# Patient Record
Sex: Female | Born: 1965
Health system: Southern US, Community
[De-identification: ages and names within clinical notes are randomized; demographics above are authoritative.]

## PROBLEM LIST (undated history)

## (undated) DIAGNOSIS — E119 Type 2 diabetes mellitus without complications: Secondary | ICD-10-CM

## (undated) DIAGNOSIS — D689 Coagulation defect, unspecified: Secondary | ICD-10-CM

## (undated) DIAGNOSIS — I10 Essential (primary) hypertension: Secondary | ICD-10-CM

## (undated) DIAGNOSIS — R519 Headache, unspecified: Secondary | ICD-10-CM

## (undated) DIAGNOSIS — M1990 Unspecified osteoarthritis, unspecified site: Secondary | ICD-10-CM

## (undated) DIAGNOSIS — G629 Polyneuropathy, unspecified: Secondary | ICD-10-CM

## (undated) DIAGNOSIS — K219 Gastro-esophageal reflux disease without esophagitis: Secondary | ICD-10-CM

## (undated) DIAGNOSIS — R42 Dizziness and giddiness: Secondary | ICD-10-CM

## (undated) DIAGNOSIS — E785 Hyperlipidemia, unspecified: Secondary | ICD-10-CM

## (undated) DIAGNOSIS — M5136 Other intervertebral disc degeneration, lumbar region: Secondary | ICD-10-CM

## (undated) DIAGNOSIS — M51369 Other intervertebral disc degeneration, lumbar region without mention of lumbar back pain or lower extremity pain: Secondary | ICD-10-CM

## (undated) DIAGNOSIS — R51 Headache: Secondary | ICD-10-CM

## (undated) HISTORY — PX: CHOLECYSTECTOMY: SHX55

## (undated) HISTORY — DX: Hyperlipidemia, unspecified: E78.5

## (undated) HISTORY — PX: UTERINE FIBROID SURGERY: SHX826

## (undated) HISTORY — PX: SHOULDER SURGERY: SHX246

## (undated) HISTORY — DX: Type 2 diabetes mellitus without complications: E11.9

## (undated) HISTORY — DX: Essential (primary) hypertension: I10

## (undated) HISTORY — PX: TONSILLECTOMY AND ADENOIDECTOMY: SUR1326

## (undated) HISTORY — PX: OTHER SURGICAL HISTORY: SHX169

## (undated) HISTORY — PX: ABDOMINAL HYSTERECTOMY: SHX81

## (undated) HISTORY — PX: OVARIAN CYST SURGERY: SHX726

---

## 2005-02-12 ENCOUNTER — Ambulatory Visit: Payer: Self-pay

## 2006-05-12 ENCOUNTER — Other Ambulatory Visit: Payer: Self-pay

## 2006-05-12 ENCOUNTER — Emergency Department: Payer: Self-pay | Admitting: Unknown Physician Specialty

## 2006-08-04 LAB — HM PAP SMEAR

## 2007-01-12 ENCOUNTER — Ambulatory Visit: Payer: Self-pay | Admitting: Family Medicine

## 2007-01-20 ENCOUNTER — Ambulatory Visit: Payer: Self-pay | Admitting: Family Medicine

## 2007-01-20 LAB — HM MAMMOGRAPHY

## 2008-11-13 ENCOUNTER — Emergency Department: Payer: Self-pay | Admitting: Internal Medicine

## 2013-07-31 ENCOUNTER — Emergency Department: Payer: Self-pay | Admitting: Emergency Medicine

## 2013-12-15 ENCOUNTER — Ambulatory Visit: Payer: Self-pay | Admitting: Unknown Physician Specialty

## 2014-10-12 LAB — TSH: TSH: 1.19 u[IU]/mL (ref <=5.90)

## 2014-10-12 LAB — CBC AND DIFFERENTIAL
HCT: 43 % (ref 36–46)
HEMOGLOBIN: 14.7 g/dL (ref 12.0–16.0)
Platelets: 380 10*3/uL (ref 150–399)
WBC: 9.4 10^3/mL

## 2015-02-20 LAB — BASIC METABOLIC PANEL
BUN: 10 mg/dL (ref 4–21)
CREATININE: 0.6 mg/dL (ref 0.5–1.1)
Glucose: 231 mg/dL
POTASSIUM: 5.3 mmol/L (ref 3.4–5.3)
SODIUM: 139 mmol/L (ref 137–147)

## 2015-02-20 LAB — HEPATIC FUNCTION PANEL
ALT: 52 U/L — AB (ref 7–35)
AST: 34 U/L (ref 13–35)
Alkaline Phosphatase: 84 U/L (ref 25–125)
BILIRUBIN, TOTAL: 0.8 mg/dL

## 2015-02-20 LAB — HEMOGLOBIN A1C: HEMOGLOBIN A1C: 8.3 % — AB (ref 4.0–6.0)

## 2015-03-06 ENCOUNTER — Encounter: Payer: Self-pay | Admitting: Endocrinology

## 2015-03-06 ENCOUNTER — Encounter (INDEPENDENT_AMBULATORY_CARE_PROVIDER_SITE_OTHER): Payer: Self-pay

## 2015-03-06 ENCOUNTER — Ambulatory Visit (INDEPENDENT_AMBULATORY_CARE_PROVIDER_SITE_OTHER): Payer: 59 | Admitting: Endocrinology

## 2015-03-06 VITALS — BP 110/68 | HR 87 | Resp 14 | Ht 70.0 in | Wt 224.2 lb

## 2015-03-06 DIAGNOSIS — R252 Cramp and spasm: Secondary | ICD-10-CM | POA: Insufficient documentation

## 2015-03-06 DIAGNOSIS — E1140 Type 2 diabetes mellitus with diabetic neuropathy, unspecified: Secondary | ICD-10-CM | POA: Insufficient documentation

## 2015-03-06 DIAGNOSIS — E785 Hyperlipidemia, unspecified: Secondary | ICD-10-CM | POA: Insufficient documentation

## 2015-03-06 DIAGNOSIS — E1142 Type 2 diabetes mellitus with diabetic polyneuropathy: Secondary | ICD-10-CM

## 2015-03-06 DIAGNOSIS — E042 Nontoxic multinodular goiter: Secondary | ICD-10-CM | POA: Insufficient documentation

## 2015-03-06 DIAGNOSIS — E010 Iodine-deficiency related diffuse (endemic) goiter: Secondary | ICD-10-CM

## 2015-03-06 DIAGNOSIS — E049 Nontoxic goiter, unspecified: Secondary | ICD-10-CM

## 2015-03-06 DIAGNOSIS — G2581 Restless legs syndrome: Secondary | ICD-10-CM | POA: Insufficient documentation

## 2015-03-06 DIAGNOSIS — R7989 Other specified abnormal findings of blood chemistry: Secondary | ICD-10-CM | POA: Insufficient documentation

## 2015-03-06 DIAGNOSIS — M545 Low back pain, unspecified: Secondary | ICD-10-CM | POA: Insufficient documentation

## 2015-03-06 DIAGNOSIS — I10 Essential (primary) hypertension: Secondary | ICD-10-CM

## 2015-03-06 DIAGNOSIS — R945 Abnormal results of liver function studies: Secondary | ICD-10-CM

## 2015-03-06 DIAGNOSIS — G8929 Other chronic pain: Secondary | ICD-10-CM | POA: Insufficient documentation

## 2015-03-06 DIAGNOSIS — K219 Gastro-esophageal reflux disease without esophagitis: Secondary | ICD-10-CM | POA: Insufficient documentation

## 2015-03-06 DIAGNOSIS — E669 Obesity, unspecified: Secondary | ICD-10-CM | POA: Insufficient documentation

## 2015-03-06 LAB — HM DIABETES FOOT EXAM: HM Diabetic Foot Exam: ABNORMAL

## 2015-03-06 MED ORDER — GLUCOSE BLOOD VI STRP: ORAL_STRIP | Status: DC

## 2015-03-06 MED ORDER — CANAGLIFLOZIN 100 MG PO TABS: 100.0000 mg | ORAL_TABLET | Freq: Every day | ORAL | Status: DC

## 2015-03-06 NOTE — Assessment & Plan Note (Signed)
BP at target today. Update urine MA at next few visits.

## 2015-03-06 NOTE — Assessment & Plan Note (Signed)
Felt on today's exam. Discussed goiter, compressive symptoms, thyroid nodules in brief. Ordered thyroid US for evaluation and TSH.

## 2015-03-06 NOTE — Assessment & Plan Note (Signed)
Obtain lipid testing from PCP. Has mild transaminitis. Now off statin due to leg cramping.

## 2015-03-06 NOTE — Progress Notes (Signed)
Reason for visit-  Gabriella Marsh is a 49 y.o.-year-old female, referred by her PCP,  Dr Margarita Rana, of [facility], for management of Type 2 diabetes, uncontrolled, with complications ( neuropathy).   HPI- Patient has been diagnosed with diabetes in 2002/2003. Recalls being initially on lifestyle modifications.  Tried  Metformin, Glipizide, Actos, Januvia. she has not been on insulin before. Has not tried injectable therapy before.   Pt is currently on a regimen of: - Janumet 50/1000 mg PO BID ( ~2012) - Glipizide 5-10 mg twice daily ( takes 5 mg BID three times weekly due to perceived hypoglycemia)   Last hemoglobin A1c was: Lab Results  Component Value Date   HGBA1C 8.3* 02/20/2015   11/2014- 7.9% 02/2015-8.3%  Pt checks her sugars 0 a day recently . Uses Verio glucometer- just got it as preferred by insurance. By recall/meter download/meter review they are:  PREMEAL Breakfast Lunch Dinner Bedtime Overall  Glucose range:     200s  Mean/median:        POST-MEAL PC Breakfast PC Lunch PC Dinner  Glucose range:     Mean/median:       Hypoglycemia-  No lows. Lowest sugar was ?; she has hypoglycemia awareness at 70. Once- 2 times monthly gets symptoms  Dietary habits- eats three times daily. Tries to limit carbs, sweetened beverages, sodas, desserts. Weakness- icecream, not anymore Cherrios/oatmeal BF, apples/banana- snack. Lunch cheese, meat, pretzels. Afternoon snack- grapes Dinner- hot meal, occ night time snack- popcorn, nuts. Made dietary changes last week.  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  Wt Readings from Last 3 Encounters:  03/06/15 224 lb 4 oz (101.719 kg)    Diabetes Complications-  Nephropathy- No  CKD, last BUN/creatinine- GFR 109. mildy elevated K levels recently.  Lab Results  Component Value Date   BUN 10 02/20/2015   CREATININE 0.6 02/20/2015   Lab Results  Component Value Date    K 5.3 02/20/2015     Retinopathy- No, Last DEE was in Feb 2016 Neuropathy- has numbness and tingling in )her feet. Known neuropathy. Being treated with neurontin, nortriptyline. Sees podiatry annually. Associated history - No CAD . No prior stroke. No hypothyroidism. her last TSH was No results found for: TSH  Hyperlipidemia-  her last set of lipids were- was on Lipitor, taken off due to leg cramps about 3 months ago. Tolerating well.  No results found for: CHOL, HDL, LDLCALC, LDLDIRECT, TRIG, CHOLHDL  Lab Results  Component Value Date   AST 34 02/20/2015   ALT 52* 02/20/2015     Blood Pressure/HTN- Patient's blood pressure is well controlled today on current regimen that includes ACE-I ( lisinopril).  Pt has FH of DM in father.  I have reviewed the patient's past medical history, family and social history, surgical history, medications and allergies.  Past Medical History  Diagnosis Date  . Hypertension   . Diabetes mellitus without complication   . Hyperlipidemia    Past Surgical History  Procedure Laterality Date  . Cesarean section    . Abdominal hysterectomy    . Shoulder surgery Left    Family History  Problem Relation Age of Onset  . Osteoporosis Mother   . Diabetes Father   . Heart disease Father   . Hypertension Father   . Osteoporosis Maternal Grandmother    History   Social History  . Marital Status: Divorced    Spouse Name: N/A  . Number of  Children: N/A  . Years of Education: N/A   Occupational History  . Not on file.   Social History Main Topics  . Smoking status: Former Research scientist (life sciences)  . Smokeless tobacco: Never Used  . Alcohol Use: No  . Drug Use: No  . Sexual Activity: Not on file   Other Topics Concern  . Not on file   Social History Narrative  . No narrative on file   No current outpatient prescriptions on file prior to visit.   No current facility-administered medications on file prior to visit.   No Known Allergies   Review of  Systems: [x]  complains of  [  ] denies General:   [  ] Recent weight change [ x ] Fatigue  [  ] Loss of appetite Eyes: [  ]  Vision Difficulty [  ]  Eye pain ENT: [  ]  Hearing difficulty [  ]  Difficulty Swallowing CVS: [  ] Chest pain [  ]  Palpitations/Irregular Heart beat [  ]  Shortness of breath lying flat [ x ] Swelling of legs Resp: [  ] Frequent Cough [  ] Shortness of Breath  [  ]  Wheezing GI: [ x ] Heartburn  [  ] Nausea or Vomiting  [  ] Diarrhea [  ] Constipation  [  ] Abdominal Pain GU: [  ]  Polyuria  [  ]  nocturia Bones/joints:  [ x ]  Muscle aches  [ x ] Joint Pain  [  ] Bone pain Skin/Hair/Nails: [  ]  Rash  [  ] New stretch marks [  ]  Itching [  ] Hair loss [  ]  Excessive hair growth Reproduction: [  ] Low sexual desire , [  ]  Women: Menstrual cycle problems [  ]  Women: Breast Discharge [  ] Men: Difficulty with erections [  ]  Men: Enlarged Breasts CNS: [  ] Frequent Headaches [  ] Blurry vision [  ] Tremors [  ] Seizures [  ] Loss of consciousness [ x ] Localized weakness Endocrine: [x  ]  Excess thirst [ x ]  Feeling excessively hot [  ]  Feeling excessively cold Heme: [  ]  Easy bruising [  ]  Enlarged glands or lumps in neck Allergy: [  ]  Food allergies [  ] Environmental allergies  PE: BP 110/68 mmHg  Pulse 87  Resp 14  Ht 5\' 10"  (1.778 m)  Wt 224 lb 4 oz (101.719 kg)  BMI 32.18 kg/m2  SpO2 95% Wt Readings from Last 3 Encounters:  03/06/15 224 lb 4 oz (101.719 kg)   GENERAL: No acute distress, well developed HEENT:  Eye exam shows normal external appearance. Oral exam shows normal mucosa .  NECK:   Neck exam shows no lymphadenopathy. No Carotids bruits. Thyroid is enlarged and no nodules felt.  no acanthosis nigricans LUNGS:         Chest is symmetrical. Lungs are clear to auscultation.Marland Kitchen   HEART:         Heart sounds:  S1 and S2 are normal. No murmurs or clicks heard. ABDOMEN:  No Distention present. Liver and spleen are not palpable. No other mass or  tenderness present. no abnormal striae EXTREMITIES:     There is no edema. 2+ DP pulses  NEUROLOGICAL:     Grossly intact.            Diabetic foot exam done  with shoes and socks removed: abnormal and decreased over toes Monofilament testing bilaterally. No deformities of toes.  Nails  Not dystrophic. Skin normal color. No open wounds. Dry skin.  MUSCULOSKELETAL:       There is no enlargement or gross deformity of the joints.  SKIN:       No rash  ASSESSMENT AND PLAN: Problem List Items Addressed This Visit      Cardiovascular and Mediastinum   Essential hypertension    BP at target today. Update urine MA at next few visits.       Relevant Medications   lisinopril (PRINIVIL,ZESTRIL) 5 MG tablet     Endocrine   Thyromegaly    Felt on today's exam. Discussed goiter, compressive symptoms, thyroid nodules in brief. Ordered thyroid US for evaluation and TSH.       Relevant Orders   US Soft Tissue Head/Neck   TSH     Nervous and Auditory   Diabetic neuropathy - Primary    Recent A1c not controlled and trending up.  Discussed goal A1c and sugars.  Discussed dietary changes and incorporating protein and low carb options in all meals.  Discussed "desk exercises" to be done if possible while at work.   Discussed foot care, eye exams, hypoglycemia treatment and recognition  Discussed medication management and options for better sugar control.  She has elected to try SGLT2 therapy. Check CMP first due to recent hyperkalemia. If K is normal, then will start Invokana 100 mg daily. Risk profile and side effects discussed.  She will return in 2 weeks for recheck of GFR.   Continue current janumet for now. Decrease Glipizide to 5 mg twice daily due to symptomatic hypoglycemia.         Relevant Medications   lisinopril (PRINIVIL,ZESTRIL) 5 MG tablet   glipiZIDE (GLUCOTROL) 10 MG tablet   sitaGLIPtin-metformin (JANUMET) 50-1000 MG per tablet   canagliflozin (INVOKANA) 100 MG TABS tablet    Other Relevant Orders   Comprehensive metabolic panel     Other   Obesity    Encouraged lifestyle modifications and hopefully with start of SGLT2 therapy she will continue to lose weight.       Relevant Medications   glipiZIDE (GLUCOTROL) 10 MG tablet   sitaGLIPtin-metformin (JANUMET) 50-1000 MG per tablet   canagliflozin (INVOKANA) 100 MG TABS tablet   Hyperlipidemia    Obtain lipid testing from PCP. Has mild transaminitis. Now off statin due to leg cramping.       Relevant Medications   lisinopril (PRINIVIL,ZESTRIL) 5 MG tablet       - Return to clinic in 6 weeks with sugar log/meter.  Eustacia Urbanek [facility] 03/06/2015 9:29 AM

## 2015-03-06 NOTE — Progress Notes (Signed)
Pre visit review using our clinic review tool, if applicable. No additional management support is needed unless otherwise documented below in the visit note. 

## 2015-03-06 NOTE — Patient Instructions (Signed)
Check sugars 2 x daily ( before breakfast and before supper).  Record them in a log book and bring that/meter to next appointment.   Continue current janumet.  Decrease glipizide to 5 mg twice daily.  Start Invokana 100 mg daily with breakfast. Stay hydrated. Return in 2 weeks for non fasting labs.  Labs today.   Please come back for a follow-up appointment in 6 weeks

## 2015-03-06 NOTE — Assessment & Plan Note (Signed)
Recent A1c not controlled and trending up.  Discussed goal A1c and sugars.  Discussed dietary changes and incorporating protein and low carb options in all meals.  Discussed "desk exercises" to be done if possible while at work.   Discussed foot care, eye exams, hypoglycemia treatment and recognition  Discussed medication management and options for better sugar control.  She has elected to try SGLT2 therapy. Check CMP first due to recent hyperkalemia. If K is normal, then will start Invokana 100 mg daily. Risk profile and side effects discussed.  She will return in 2 weeks for recheck of GFR.   Continue current janumet for now. Decrease Glipizide to 5 mg twice daily due to symptomatic hypoglycemia.

## 2015-03-06 NOTE — Assessment & Plan Note (Signed)
Encouraged lifestyle modifications and hopefully with start of SGLT2 therapy she will continue to lose weight.

## 2015-03-07 ENCOUNTER — Other Ambulatory Visit: Payer: Self-pay | Admitting: Endocrinology

## 2015-03-07 LAB — COMPREHENSIVE METABOLIC PANEL
ALT: 53 IU/L — ABNORMAL HIGH (ref 0–32)
AST: 35 IU/L (ref 0–40)
Albumin/Globulin Ratio: 1.7 (ref 1.1–2.5)
Albumin: 4.4 g/dL (ref 3.5–5.5)
Alkaline Phosphatase: 89 IU/L (ref 39–117)
BUN/Creatinine Ratio: 28 — ABNORMAL HIGH (ref 9–23)
BUN: 16 mg/dL (ref 6–24)
Bilirubin Total: 0.8 mg/dL (ref 0.0–1.2)
CO2: 25 mmol/L (ref 18–29)
Calcium: 9.9 mg/dL (ref 8.7–10.2)
Chloride: 96 mmol/L — ABNORMAL LOW (ref 97–108)
Creatinine, Ser: 0.58 mg/dL (ref 0.57–1.00)
GFR, EST AFRICAN AMERICAN: 127 mL/min/{1.73_m2} (ref >59)
GFR, EST NON AFRICAN AMERICAN: 110 mL/min/{1.73_m2} (ref >59)
Globulin, Total: 2.6 g/dL (ref 1.5–4.5)
Glucose: 198 mg/dL — ABNORMAL HIGH (ref 65–99)
POTASSIUM: 5.9 mmol/L — AB (ref 3.5–5.2)
Sodium: 138 mmol/L (ref 134–144)
Total Protein: 7 g/dL (ref 6.0–8.5)

## 2015-03-07 MED ORDER — METFORMIN HCL 1000 MG PO TABS: 1000.0000 mg | ORAL_TABLET | Freq: Two times a day (BID) | ORAL | Status: DC

## 2015-03-07 MED ORDER — ALBIGLUTIDE 30 MG ~~LOC~~ PEN: 30.0000 mg | PEN_INJECTOR | SUBCUTANEOUS | Status: DC

## 2015-03-08 ENCOUNTER — Telehealth: Payer: Self-pay | Admitting: *Deleted

## 2015-03-08 ENCOUNTER — Other Ambulatory Visit: Payer: Self-pay | Admitting: Endocrinology

## 2015-03-08 DIAGNOSIS — E1142 Type 2 diabetes mellitus with diabetic polyneuropathy: Secondary | ICD-10-CM

## 2015-03-08 LAB — TSH: TSH: 1.11 u[IU]/mL (ref 0.450–4.500)

## 2015-03-08 LAB — SPECIMEN STATUS REPORT

## 2015-03-08 NOTE — Telephone Encounter (Signed)
Pt coming in Monday what labs and dx? 

## 2015-03-08 NOTE — Telephone Encounter (Signed)
Orders for bmp in the emr. thanks

## 2015-03-11 ENCOUNTER — Other Ambulatory Visit: Payer: 59

## 2015-03-12 ENCOUNTER — Other Ambulatory Visit: Payer: 59

## 2015-03-13 ENCOUNTER — Other Ambulatory Visit (INDEPENDENT_AMBULATORY_CARE_PROVIDER_SITE_OTHER): Payer: 59

## 2015-03-13 DIAGNOSIS — E1142 Type 2 diabetes mellitus with diabetic polyneuropathy: Secondary | ICD-10-CM

## 2015-03-13 LAB — BASIC METABOLIC PANEL
BUN: 12 mg/dL (ref 6–23)
CALCIUM: 9.5 mg/dL (ref 8.4–10.5)
CO2: 27 mEq/L (ref 19–32)
Chloride: 99 mEq/L (ref 96–112)
Creatinine, Ser: 0.61 mg/dL (ref 0.40–1.20)
GFR: 111.29 mL/min (ref >60.00)
GLUCOSE: 214 mg/dL — AB (ref 70–99)
Potassium: 4.9 mEq/L (ref 3.5–5.1)
Sodium: 136 mEq/L (ref 135–145)

## 2015-03-13 NOTE — Addendum Note (Signed)
Addended by: Johnsie Cancel on: 03/13/2015 08:06 AM   Modules accepted: Orders

## 2015-03-19 ENCOUNTER — Ambulatory Visit: Payer: Self-pay | Admitting: Endocrinology

## 2015-03-20 ENCOUNTER — Other Ambulatory Visit: Payer: 59

## 2015-03-22 ENCOUNTER — Telehealth: Payer: Self-pay | Admitting: *Deleted

## 2015-03-22 ENCOUNTER — Encounter: Payer: Self-pay | Admitting: Endocrinology

## 2015-03-22 NOTE — Telephone Encounter (Signed)
Left message for pt to return my call. Also need to ask patient if she is taking Tanzeum using discount card. PA was denied.

## 2015-03-22 NOTE — Telephone Encounter (Signed)
-----   Message from Haydee Monica, MD sent at 03/22/2015  2:07 PM EDT ----- Regarding: thyroid US results Please let her know that I have reviewed her thyroid US results from [facility] , done 03/19/15:  Right lobe- 5.3x2.3x2.2cm.  Dominant right nodule with solid and cystic features 2.1 x1.2 x1.9cm.  Left lobe - 5.4 x2.2 x1.9 cm.  Hypoechoic mid left lobe nodule 1.1cm  x6 x79mm Inferior nodule 22 x17 x21 mm with solid features  Isthmus- 60mm without nodules.  No enlarged lymph nodes  Both the dominant right, 2.1 cm and dominant left inferior nodule, 2.2 cm meet criteria for FNA.  Usually thyroid nodules are benign on pathology, but sometimes they could return as abnormal ( 5-15% risk of malignancy per nodule). The procedure is done in the office at [facility] radiology under local anesthesia. There are no major complications associated with the biopsy or any major restrictions after the procedure. Small risk of infection and bleeding locally, which is similar to a blood draw. Please let me know if you would like to get set up for this, and I can put in the order. Please let me know if you have any other questions and I can answer them for you. Alternatively, we could discuss this at follow up visit.

## 2015-03-25 NOTE — Telephone Encounter (Signed)
Left message for pt to return my call.

## 2015-03-26 NOTE — Telephone Encounter (Signed)
Called and advised patient of results,  verbalized understanding. Would like to discuss FNA more at upcoming appt, FYI.   Pt has been using Tanzeum with discount card. Doing well without complaints.

## 2015-03-26 NOTE — Telephone Encounter (Signed)
Noted, thanks!

## 2015-04-01 ENCOUNTER — Telehealth: Payer: Self-pay | Admitting: *Deleted

## 2015-04-01 NOTE — Telephone Encounter (Signed)
Pt called states since starting the Metformin 1000 mg she has had diarrhea 4-6 times a day.  Please advise

## 2015-04-02 ENCOUNTER — Other Ambulatory Visit: Payer: Self-pay

## 2015-04-02 ENCOUNTER — Other Ambulatory Visit: Payer: Self-pay | Admitting: Endocrinology

## 2015-04-02 MED ORDER — METFORMIN HCL ER (MOD) 1000 MG PO TB24: 1000.0000 mg | ORAL_TABLET | Freq: Two times a day (BID) | ORAL | Status: DC

## 2015-04-02 NOTE — Telephone Encounter (Signed)
Spoke to patient to notify her of Dr. Boyd Kerbs comments. Patient verbalized understanding and agrees to try the new Rx. Patient also stated that she has neuropathy in her feet and was out of work for 2 days. Patient has FMLA but is considered that she is spending more and more time out of work due to her health issues. Patient would like to discuss this matter with Dr. Pamala Duffel in detail at her next office visit. Dr. Howell Rucks sent in Rx to mail order pharmacy but it was not a 90 day supply. I sent a 30 day supply to [facility] per patient request and sent a new 90 day Rx to mail order pharmacy. Patient aware.

## 2015-04-02 NOTE — Telephone Encounter (Signed)
Noted. Please let her know that FMLA requests and paperwork are typically done through PCP office. I will discuss neuropathy with her though at f/u appt

## 2015-04-02 NOTE — Telephone Encounter (Signed)
See message below. Thanks

## 2015-04-02 NOTE — Telephone Encounter (Signed)
Please ask her to try Metformin Er/XR ( I have sent the script to her pharmacy). If she does not tolerate this, then she should let me know.

## 2015-04-02 NOTE — Telephone Encounter (Signed)
30 day supply sent to local pharmacy and 90 supply sent to mail order as requested by patient. Dr. Howell Rucks aware.

## 2015-04-02 NOTE — Telephone Encounter (Signed)
I informed patient that her PCP will have to continue filling out her FMLA forms as needed. Patient verbalized understanding.

## 2015-04-10 ENCOUNTER — Telehealth: Payer: Self-pay | Admitting: Endocrinology

## 2015-04-10 NOTE — Telephone Encounter (Signed)
PA for Metformin ER started and faxed to [facility]

## 2015-04-16 ENCOUNTER — Other Ambulatory Visit: Payer: Self-pay | Admitting: Endocrinology

## 2015-04-16 MED ORDER — METFORMIN HCL ER 500 MG PO TB24: 1000.0000 mg | ORAL_TABLET | Freq: Two times a day (BID) | ORAL | Status: DC

## 2015-04-17 ENCOUNTER — Ambulatory Visit (INDEPENDENT_AMBULATORY_CARE_PROVIDER_SITE_OTHER): Payer: 59 | Admitting: Endocrinology

## 2015-04-17 ENCOUNTER — Other Ambulatory Visit: Payer: Self-pay | Admitting: Endocrinology

## 2015-04-17 ENCOUNTER — Encounter: Payer: Self-pay | Admitting: Endocrinology

## 2015-04-17 VITALS — BP 126/70 | HR 80 | Temp 98.2°F | Resp 16 | Ht 70.0 in | Wt 221.4 lb

## 2015-04-17 DIAGNOSIS — I10 Essential (primary) hypertension: Secondary | ICD-10-CM | POA: Diagnosis not present

## 2015-04-17 DIAGNOSIS — E785 Hyperlipidemia, unspecified: Secondary | ICD-10-CM | POA: Diagnosis not present

## 2015-04-17 DIAGNOSIS — E042 Nontoxic multinodular goiter: Secondary | ICD-10-CM

## 2015-04-17 DIAGNOSIS — E1142 Type 2 diabetes mellitus with diabetic polyneuropathy: Secondary | ICD-10-CM

## 2015-04-17 MED ORDER — GABAPENTIN 100 MG PO CAPS: 200.0000 mg | ORAL_CAPSULE | Freq: Three times a day (TID) | ORAL | Status: DC

## 2015-04-17 NOTE — Assessment & Plan Note (Signed)
Has mild transaminitis. Now off statin due to leg cramping. No recent lipid testing.

## 2015-04-17 NOTE — Assessment & Plan Note (Signed)
BP at target today. Update urine MA at next few visits.   Off ACE-I since last visit due to hyperkalemia.  This might be reintroduced at next few visits with careful monitoring of her K levels.

## 2015-04-17 NOTE — Assessment & Plan Note (Signed)
Discussed etiology of thyroid nodules, follow up monitoring, possible pathology reports, 5-15% risk of thyroid cancer.  Discussed FNA of thyroid nodules and the patient is agreeable.  Will order for FNA of right thyroid nodule and left inferior nodule.

## 2015-04-17 NOTE — Progress Notes (Signed)
Reason for visit-  Gabriella Marsh is a 49 y.o.-year-old female, here for follow up management of Type 2 diabetes, uncontrolled, with complications ( neuropathy).   HPI- Patient has been diagnosed with diabetes in 2002/2003. Recalls being initially on lifestyle modifications.  Tried  Metformin, Glipizide, Actos, Januvia. she has not been on insulin before.   -Januvia stopped March 2016>>changed to GLP-1 and Metformin -Didn't tolerate the regular form of metformin due to diarrhea>>changed to XR form  Pt is currently on a regimen of: - tanzeum 30 mg [place] weekly ( start March 2016) - Glipizide 5 mg twice daily (decreased at last visit)>>she misunderstood instructions and stopped taking med -Metformin XR 1000 mg twice daily (start March 2016)>>tolerating better than regular metformin   Last hemoglobin A1c was: Lab Results  Component Value Date   HGBA1C 8.3* 02/20/2015   11/2014- 7.9% 02/2015-8.3%  Pt checks her sugars <1 a day recently . Uses Verio glucometer- just got it as preferred by insurance. By recall/meter download/meter review they are:  PREMEAL Breakfast Lunch Dinner Bedtime Overall  Glucose range: 170-180 228-229     Mean/median:        POST-MEAL PC Breakfast PC Lunch PC Dinner  Glucose range:     Mean/median:       Hypoglycemia-  No lows. Lowest sugar was ?; she has hypoglycemia awareness at 70. Once- 2 times monthly gets symptoms-not happening recently   Dietary habits- eats three times daily. Tries to limit carbs, sweetened beverages, sodas, desserts. Weakness- icecream, not anymore Cherrios/oatmeal BF, apples/banana- snack. Lunch cheese, meat, pretzels. Afternoon snack- grapes Dinner- hot meal, occ night time snack- popcorn, nuts. Made dietary changes last week. 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 Working 8 hours daily. Wonders if taking time off work might be better. hasnt seen her PCP yet Weight - down recently.  Wt Readings from Last 3 Encounters:  04/17/15 221 lb 6.4 oz (100.426 kg)  03/06/15 224 lb 4 oz (101.719 kg)    Diabetes Complications-  Nephropathy- No  CKD, last BUN/creatinine- GFR 109. mildy elevated K levels recently>>taken off lisinopril and K normalized.  Lab Results  Component Value Date   BUN 12 03/13/2015   CREATININE 0.61 03/13/2015   Lab Results  Component Value Date   K 4.9 03/13/2015     Retinopathy- No, Last DEE was in Feb 2016 Neuropathy- has numbness and tingling in )her feet. Known neuropathy. Being treated with neurontin, nortriptyline. Sees podiatry annually.not controlled Associated history - No CAD . No prior stroke. No hypothyroidism. her last TSH was  Lab Results  Component Value Date   TSH 1.110 03/06/2015    Hyperlipidemia-  her last set of lipids were- was on Lipitor, taken off due to leg cramps about 3 months ago. Tolerating well.  No results found for: CHOL, HDL, LDLCALC, LDLDIRECT, TRIG, CHOLHDL  Lab Results  Component Value Date   AST 35 03/06/2015   ALT 53* 03/06/2015     Blood Pressure/HTN- Patient's blood pressure is well controlled today on current regimen. Off ACE-I ( lisinopril) since last visit due to hyperkalemia.  Multinodular goiter- Detected on last exam. She has dominant right and left inferior nodules that meet criteria for FNA. No FH thyroid cancer or personal XRT exposure. Recent TSH normal.  I have reviewed the patient's past medical history,  medications and allergies.   Current Outpatient Prescriptions on File Prior to Visit  Medication Sig Dispense Refill  . Albiglutide (TANZEUM) 30 MG PEN Inject 30 mg into the skin once a week. 4 each 3  . ALPRAZolam (XANAX) 0.5 MG tablet Take 0.5 mg by mouth 2 (two) times daily as needed for anxiety.    .  gabapentin (NEURONTIN) 600 MG tablet Take 600 mg by mouth 3 (three) times daily.    Marland Kitchen glipiZIDE (GLUCOTROL) 10 MG tablet Take 5 mg by mouth 2 (two) times daily before a meal.    . glucose blood test strip Check blood sugars twice daily. OneTouch Verio strips. 200 each 5  . lisinopril (PRINIVIL,ZESTRIL) 5 MG tablet Take 5 mg by mouth daily.    . metFORMIN (GLUCOPHAGE-XR) 500 MG 24 hr tablet Take 2 tablets (1,000 mg total) by mouth 2 (two) times daily with a meal. 120 tablet 1  . nortriptyline (PAMELOR) 25 MG capsule Take 25 mg by mouth at bedtime.    Marland Kitchen oxyCODONE-acetaminophen (PERCOCET/ROXICET) 5-325 MG per tablet Take 1 tablet by mouth every 6 (six) hours as needed for severe pain.    Marland Kitchen venlafaxine (EFFEXOR) 75 MG tablet Take 75 mg by mouth 2 (two) times daily.     No current facility-administered medications on file prior to visit.   No Known Allergies   Review of Systems- [ x ]  Complains of    [  ]  denies [  ] Recent weight change [ x ]  Fatigue [  ] polydipsia [  ] polyuria [  ]  nocturia [  ]  vision difficulty [  ] chest pain [  ] shortness of breath [ x ] leg swelling [  ] cough [x  ] nausea/vomiting-mild, episodic [  ] diarrhea [  ] constipation [  ] abdominal pain [  ]  tingling/numbness in extremities [x  ]  concern with feet ( wounds/sores)  PE: BP 126/70 mmHg  Pulse 80  Temp(Src) 98.2 F (36.8 C) (Oral)  Resp 16  Ht 5\' 10"  (1.778 m)  Wt 221 lb 6.4 oz (100.426 kg)  BMI 31.77 kg/m2  SpO2 98% Wt Readings from Last 3 Encounters:  04/17/15 221 lb 6.4 oz (100.426 kg)  03/06/15 224 lb 4 oz (101.719 kg)   Exam: deferred  ASSESSMENT AND PLAN: Problem List Items Addressed This Visit      Cardiovascular and Mediastinum   Essential hypertension    BP at target today. Update urine MA at next few visits.   Off ACE-I since last visit due to hyperkalemia.  This might be reintroduced at next few visits with careful monitoring of her K levels.         Endocrine    Multinodular goiter    Discussed etiology of thyroid nodules, follow up monitoring, possible pathology reports, 5-15% risk of thyroid cancer.  Discussed FNA of thyroid nodules and the patient is agreeable.  Will order for FNA of right thyroid nodule and left inferior nodule.        Relevant Orders   US Thyroid Biopsy     Nervous and Auditory   Diabetic neuropathy - Primary    Recent A1c not controlled and trending up.  Congratulated her on making dietary changes and the recent weight loss.  Encouraged her to check sugars 2xdaily and bring meter to all appointments.   She will continue current metformin, tanzeum and restart  Glipiizde at 5 mg twice daily.  Discussed management of neuropathy..increasing gabapentin versus adding on Cymbalta.   She has elected to try increased Gabapentin for now and will increase it to 800 mg three times daily.  Report back if does not tolerate or not helpful, then would add on Cymbalta.  She was also asked to follow up with her PCP to consider referral to pain clinic or PT. She was asking my opinion to be off work for some time, and this may be helpful, though I explained that I cannot write her a worknote for such.  She will make an appointment with Dr Venia Minks for this            Other   Hyperlipidemia     Has mild transaminitis. Now off statin due to leg cramping. No recent lipid testing.            - Return to clinic in 6 weeks with sugar log/meter.  Polly Barner [facility] 04/17/2015 9:07 AM

## 2015-04-17 NOTE — Patient Instructions (Signed)
Check sugars 2 x daily ( before breakfast and before supper).  Record them in a log book and bring that/meter to next appointment.   Continue metformin XR, Tanzeum and restart Glipizide to 5 mg twice daily.   Increase Gabapentin to 800 mg three times daily.  Report back if dont tolerate due to drowsiness  Schedule for thyroid FNA. Follow up with PCP for consideration of referral to pain clinic and PT.  Please come back for a follow-up appointment in 6 weeks

## 2015-04-17 NOTE — Assessment & Plan Note (Signed)
Recent A1c not controlled and trending up.  Congratulated her on making dietary changes and the recent weight loss.  Encouraged her to check sugars 2xdaily and bring meter to all appointments.   She will continue current metformin, tanzeum and restart Glipiizde at 5 mg twice daily.  Discussed management of neuropathy..increasing gabapentin versus adding on Cymbalta.   She has elected to try increased Gabapentin for now and will increase it to 800 mg three times daily.  Report back if does not tolerate or not helpful, then would add on Cymbalta.  She was also asked to follow up with her PCP to consider referral to pain clinic or PT. She was asking my opinion to be off work for some time, and this may be helpful, though I explained that I cannot write her a worknote for such.  She will make an appointment with Dr Venia Minks for this

## 2015-04-17 NOTE — Progress Notes (Signed)
Pre visit review using our clinic review tool, if applicable. No additional management support is needed unless otherwise documented below in the visit note. 

## 2015-05-01 ENCOUNTER — Ambulatory Visit
Admission: RE | Admit: 2015-05-01 | Discharge: 2015-05-01 | Disposition: A | Discharge status: Home / Self Care | Payer: 59 | Source: Ambulatory Visit | Attending: Endocrinology | Admitting: Endocrinology

## 2015-05-01 ENCOUNTER — Other Ambulatory Visit (HOSPITAL_COMMUNITY)
Admission: RE | Admit: 2015-05-01 | Discharge: 2015-05-01 | Disposition: A | Discharge status: Home / Self Care | Payer: 59 | Source: Ambulatory Visit | Attending: Interventional Radiology | Admitting: Interventional Radiology

## 2015-05-01 DIAGNOSIS — E041 Nontoxic single thyroid nodule: Secondary | ICD-10-CM | POA: Insufficient documentation

## 2015-05-01 DIAGNOSIS — E042 Nontoxic multinodular goiter: Secondary | ICD-10-CM

## 2015-05-02 ENCOUNTER — Encounter: Payer: Self-pay | Admitting: Endocrinology

## 2015-05-03 DIAGNOSIS — R0683 Snoring: Secondary | ICD-10-CM | POA: Insufficient documentation

## 2015-05-03 DIAGNOSIS — G64 Other disorders of peripheral nervous system: Secondary | ICD-10-CM | POA: Insufficient documentation

## 2015-05-03 DIAGNOSIS — E1140 Type 2 diabetes mellitus with diabetic neuropathy, unspecified: Secondary | ICD-10-CM | POA: Insufficient documentation

## 2015-05-03 DIAGNOSIS — F4320 Adjustment disorder, unspecified: Secondary | ICD-10-CM | POA: Insufficient documentation

## 2015-05-03 DIAGNOSIS — E119 Type 2 diabetes mellitus without complications: Secondary | ICD-10-CM

## 2015-05-03 DIAGNOSIS — M51379 Other intervertebral disc degeneration, lumbosacral region without mention of lumbar back pain or lower extremity pain: Secondary | ICD-10-CM | POA: Insufficient documentation

## 2015-05-03 DIAGNOSIS — M5137 Other intervertebral disc degeneration, lumbosacral region: Secondary | ICD-10-CM | POA: Insufficient documentation

## 2015-05-03 DIAGNOSIS — E7800 Pure hypercholesterolemia, unspecified: Secondary | ICD-10-CM | POA: Insufficient documentation

## 2015-05-03 DIAGNOSIS — G2581 Restless legs syndrome: Secondary | ICD-10-CM | POA: Insufficient documentation

## 2015-05-03 DIAGNOSIS — I10 Essential (primary) hypertension: Secondary | ICD-10-CM | POA: Insufficient documentation

## 2015-05-03 DIAGNOSIS — G629 Polyneuropathy, unspecified: Secondary | ICD-10-CM | POA: Insufficient documentation

## 2015-05-03 DIAGNOSIS — G4701 Insomnia due to medical condition: Secondary | ICD-10-CM | POA: Insufficient documentation

## 2015-05-03 DIAGNOSIS — E669 Obesity, unspecified: Secondary | ICD-10-CM | POA: Insufficient documentation

## 2015-05-04 ENCOUNTER — Encounter: Payer: Self-pay | Admitting: Emergency Medicine

## 2015-05-04 ENCOUNTER — Emergency Department
Admission: EM | Admit: 2015-05-04 | Discharge: 2015-05-04 | Disposition: A | Discharge status: Other Acute Inpatient Hospital | Payer: 59 | Attending: Emergency Medicine | Admitting: Emergency Medicine

## 2015-05-04 ENCOUNTER — Other Ambulatory Visit: Payer: Self-pay | Admitting: Endocrinology

## 2015-05-04 ENCOUNTER — Emergency Department: Payer: 59

## 2015-05-04 DIAGNOSIS — Z87891 Personal history of nicotine dependence: Secondary | ICD-10-CM | POA: Diagnosis not present

## 2015-05-04 DIAGNOSIS — R1010 Upper abdominal pain, unspecified: Secondary | ICD-10-CM | POA: Diagnosis present

## 2015-05-04 DIAGNOSIS — Z79899 Other long term (current) drug therapy: Secondary | ICD-10-CM | POA: Insufficient documentation

## 2015-05-04 DIAGNOSIS — E119 Type 2 diabetes mellitus without complications: Secondary | ICD-10-CM | POA: Insufficient documentation

## 2015-05-04 DIAGNOSIS — R1013 Epigastric pain: Secondary | ICD-10-CM | POA: Diagnosis not present

## 2015-05-04 DIAGNOSIS — I609 Nontraumatic subarachnoid hemorrhage, unspecified: Secondary | ICD-10-CM | POA: Diagnosis not present

## 2015-05-04 DIAGNOSIS — F419 Anxiety disorder, unspecified: Secondary | ICD-10-CM | POA: Insufficient documentation

## 2015-05-04 DIAGNOSIS — I10 Essential (primary) hypertension: Secondary | ICD-10-CM | POA: Diagnosis not present

## 2015-05-04 LAB — LIPASE, BLOOD: Lipase: 47 U/L (ref 22–51)

## 2015-05-04 LAB — CBC WITH DIFFERENTIAL/PLATELET
BASOS ABS: 0.1 10*3/uL (ref 0–0.1)
Basophils Relative: 1 %
Eosinophils Absolute: 0.1 10*3/uL (ref 0–0.7)
HCT: 40.8 % (ref 35.0–47.0)
HEMOGLOBIN: 13.9 g/dL (ref 12.0–16.0)
Lymphs Abs: 3.3 10*3/uL (ref 1.0–3.6)
MCH: 29.2 pg (ref 26.0–34.0)
MCHC: 34.1 g/dL (ref 32.0–36.0)
MCV: 85.5 fL (ref 80.0–100.0)
MONO ABS: 0.8 10*3/uL (ref 0.2–0.9)
Monocytes Relative: 7 %
NEUTROS ABS: 6.1 10*3/uL (ref 1.4–6.5)
Neutrophils Relative %: 59 %
Platelets: 265 10*3/uL (ref 150–440)
RBC: 4.77 MIL/uL (ref 3.80–5.20)
RDW: 12.8 % (ref 11.5–14.5)
WBC: 10.4 10*3/uL (ref 3.6–11.0)

## 2015-05-04 LAB — COMPREHENSIVE METABOLIC PANEL
ALBUMIN: 3.9 g/dL (ref 3.5–5.0)
ALK PHOS: 73 U/L (ref 38–126)
ALT: 68 U/L — ABNORMAL HIGH (ref 14–54)
AST: 48 U/L — ABNORMAL HIGH (ref 15–41)
Anion gap: 10 (ref 5–15)
BUN: 13 mg/dL (ref 6–20)
CO2: 25 mmol/L (ref 22–32)
CREATININE: 0.61 mg/dL (ref 0.44–1.00)
Calcium: 9.3 mg/dL (ref 8.9–10.3)
Chloride: 102 mmol/L (ref 101–111)
GFR calc Af Amer: >60 mL/min (ref >60)
GFR calc non Af Amer: >60 mL/min (ref >60)
Glucose, Bld: 246 mg/dL — ABNORMAL HIGH (ref 65–99)
POTASSIUM: 4.4 mmol/L (ref 3.5–5.1)
Sodium: 137 mmol/L (ref 135–145)
Total Bilirubin: 0.9 mg/dL (ref 0.3–1.2)
Total Protein: 7.6 g/dL (ref 6.5–8.1)

## 2015-05-04 LAB — TROPONIN I
Troponin I: <0.03 ng/mL (ref <0.031)
Troponin I: <0.03 ng/mL (ref <0.031)

## 2015-05-04 MED ORDER — SODIUM CHLORIDE 0.9 % IV BOLUS (SEPSIS)
1000.0000 mL | Freq: Once | INTRAVENOUS | Status: AC
  Administered 2015-05-04: 1000 mL via INTRAVENOUS

## 2015-05-04 MED ORDER — ONDANSETRON HCL 4 MG/2ML IJ SOLN
INTRAMUSCULAR | Status: AC
  Administered 2015-05-04: 4 mg via INTRAVENOUS
  Filled 2015-05-04: qty 2

## 2015-05-04 MED ORDER — ONDANSETRON HCL 4 MG/2ML IJ SOLN
4.0000 mg | Freq: Once | INTRAMUSCULAR | Status: AC
  Administered 2015-05-04: 4 mg via INTRAVENOUS

## 2015-05-04 MED ORDER — MORPHINE SULFATE 4 MG/ML IJ SOLN
4.0000 mg | Freq: Once | INTRAMUSCULAR | Status: AC
Start: 2015-05-04 — End: 2015-05-04
  Administered 2015-05-04: 4 mg via INTRAVENOUS

## 2015-05-04 MED ORDER — MORPHINE SULFATE 4 MG/ML IJ SOLN
INTRAMUSCULAR | Status: AC
  Administered 2015-05-04: 4 mg via INTRAVENOUS
  Filled 2015-05-04: qty 1

## 2015-05-04 NOTE — ED Notes (Signed)
Patient transported to CT 

## 2015-05-04 NOTE — ED Notes (Signed)
Patient return from CT

## 2015-05-04 NOTE — ED Notes (Signed)
Patient resting in bed. No acute distress noted. Respirations equal and unlabored.

## 2015-05-04 NOTE — ED Provider Notes (Signed)
[facility] Emergency Department Provider Note  Time seen: 8:35 AM  I have reviewed the triage vital signs and the nursing notes.   HISTORY  Chief Complaint Abdominal Pain    HPI Gabriella Marsh is a 49 y.o. female with a past medical history of hypertension, hyperlipidemia, diabetes, chronic pain, neuropathy who presents to the emergency department with upper abdominal/chest pain. According to the patient she called her husband around 3 AM complaining of upper abdominal/lower chest pain. At first the patient was asking her husband where he was, he was at work. Patient is not clear if she was confused or if she was just waking up. She does note that she takes Percocet during the day and Xanax at night before going to sleep. Patient has not had any other confusion since coming to the ER, has been acting normal per the husband. Patient states she continues to have upper abdominal/lower chest pain. Has a history of reflux but states this feels somewhat different. Patient denies any fever, nausea/vomiting, diarrhea, dysuria. Patient describes the pain as a burning sensation/dull pain in the upper abdomen (entire upper abdomen left and right side) and lower chest. Denies any shortness of breath.     Past Medical History  Diagnosis Date  . Hypertension   . Diabetes mellitus without complication   . Hyperlipidemia     Patient Active Problem List   Diagnosis Date Noted  . Adaptation reaction 05/03/2015  . DDD (degenerative disc disease), lumbosacral 05/03/2015  . Diabetes 05/03/2015  . Hypercholesteremia 05/03/2015  . BP (high blood pressure) 05/03/2015  . Insomnia due to medical condition 05/03/2015  . Neuropathy 05/03/2015  . Adiposity 05/03/2015  . Disorder of peripheral nervous system 05/03/2015  . Restless leg 05/03/2015  . Snores 05/03/2015  . Thyroid nodule 05/03/2015  . Diabetic neuropathy 03/06/2015  . Essential hypertension 03/06/2015  . Elevated  liver function tests 03/06/2015  . Obesity 03/06/2015  . Hyperlipidemia 03/06/2015  . GERD (gastroesophageal reflux disease) 03/06/2015  . Multinodular goiter 03/06/2015  . RLS (restless legs syndrome) 03/06/2015  . LBP (low back pain) 03/06/2015  . Leg cramps 03/06/2015    Past Surgical History  Procedure Laterality Date  . Cesarean section    . Abdominal hysterectomy    . Shoulder surgery Left     Current Outpatient Rx  Name  Route  Sig  Dispense  Refill  . Albiglutide (TANZEUM) 30 MG PEN   Subcutaneous   Inject 30 mg into the skin once a week.   4 each   3   . ALPRAZolam (XANAX) 0.5 MG tablet   Oral   Take 0.5 mg by mouth 2 (two) times daily as needed for anxiety.         Marland Kitchen esomeprazole (NEXIUM) 20 MG capsule   Oral   Take by mouth.         . gabapentin (NEURONTIN) 100 MG capsule   Oral   Take 2 capsules (200 mg total) by mouth 3 (three) times daily. Along with 600 mg three times daily (TDD 2400 mg daily)   180 capsule   0   . gabapentin (NEURONTIN) 600 MG tablet   Oral   Take 600 mg by mouth 3 (three) times daily.         Marland Kitchen glipiZIDE (GLUCOTROL) 10 MG tablet   Oral   Take 5 mg by mouth 2 (two) times daily before a meal.         . glucose blood  test strip      Check blood sugars twice daily. OneTouch Verio strips.   200 each   5   . lisinopril (PRINIVIL,ZESTRIL) 5 MG tablet   Oral   Take 5 mg by mouth daily.         . metFORMIN (GLUCOPHAGE-XR) 500 MG 24 hr tablet   Oral   Take 2 tablets (1,000 mg total) by mouth 2 (two) times daily with a meal.   120 tablet   1     Glumetza denied- switching over to metformin XR fo ...   . nortriptyline (PAMELOR) 25 MG capsule   Oral   Take 25 mg by mouth at bedtime.         Marland Kitchen oxyCODONE-acetaminophen (PERCOCET/ROXICET) 5-325 MG per tablet   Oral   Take 1 tablet by mouth every 6 (six) hours as needed for severe pain.         Marland Kitchen venlafaxine (EFFEXOR) 75 MG tablet   Oral   Take 75 mg by mouth 2  (two) times daily.           Allergies Celecoxib and Pregabalin  Family History  Problem Relation Age of Onset  . Osteoporosis Mother   . Diabetes Father   . Heart disease Father   . Hypertension Father   . Osteoporosis Maternal Grandmother     Social History History  Substance Use Topics  . Smoking status: Former Research scientist (life sciences)  . Smokeless tobacco: Never Used  . Alcohol Use: No    Review of Systems Constitutional: Negative for fever. Cardiovascular: Positive for lower chest pain. Respiratory: Negative for shortness of breath. Gastrointestinal: Positive for epigastric pain. Negative for vomiting/diarrhea Genitourinary: Negative for dysuria. Musculoskeletal: Negative for new back pain.  10-point ROS otherwise negative.  ____________________________________________   PHYSICAL EXAM:  VITAL SIGNS: ED Triage Vitals  Enc Vitals Group     BP 05/04/15 0435 125/83 mmHg     Pulse Rate 05/04/15 0435 100     Resp 05/04/15 0435 18     Temp 05/04/15 0435 98.1 F (36.7 C)     Temp Source 05/04/15 0435 Oral     SpO2 05/04/15 0435 100 %     Weight 05/04/15 0435 225 lb (102.059 kg)     Height 05/04/15 0435 5\' 10"  (1.778 m)     Head Cir --      Peak Flow --      Pain Score 05/04/15 0435 7     Pain Loc --      Pain Edu? --      Excl. in [place]? --     Constitutional: Alert and oriented. Well appearing and in no distress. Eyes: Normal exam, PERRL ENT   Mouth/Throat: Mucous membranes are moist. Cardiovascular: Normal rate, regular rhythm. No murmurs, rubs, or gallops. Respiratory: Normal respiratory effort without tachypnea nor retractions. Breath sounds are clear and equal bilaterally. No wheezes/rales/rhonchi. Gastrointestinal: Soft, mild/moderate epigastric tenderness to palpation. No rebound no guarding. Otherwise benign abdominal exam. Musculoskeletal: Nontender with normal range of motion in all extremities. Neurologic:  Normal speech and language. No gross focal  neurologic deficits are appreciated. Speech is normal. Equal grip strengths bilaterally. No pronator drift. Normal cranial nerves. Skin:  Skin is warm, dry and intact.  Psychiatric: Mood and affect are normal. Speech and behavior are normal.   ____________________________________________    EKG  EKG shows normal sinus rhythm at 97 bpm. Narrow QRS, normal intervals, normal axis. Nonspecific ST changes, but no concerning ST changes.  ____________________________________________    RADIOLOGY  CT the head shows 3 separate areas of possible hemorrhage.   ____________________________________________   INITIAL IMPRESSION / ASSESSMENT AND PLAN / ED COURSE  Pertinent labs & imaging results that were available during my care of the patient were reviewed by me and considered in my medical decision making (see chart for details).  Patient with dizziness and chest pain symptoms. Overnight patient had an episode of confusion with some memory impairment which is very atypical for the patient per the husband. Labs have shown mostly normal results here. CT scan shows 3 possible areas of hemorrhage, possibly consistent with a subarachnoid hemorrhage. I discussed this with the patient, and with [facility] neurosurgery who will accept the patient has a transfer. Blood pressure is controlled currently 126/75.  2 sets of troponins have been negative.  ____________________________________________   FINAL CLINICAL IMPRESSION(S) / ED DIAGNOSES  Subarachnoid hemorrhage Abdominal pain   Harvest Dark, MD 05/04/15 1236

## 2015-05-04 NOTE — ED Notes (Signed)
Pt in with epigastric pain and upper abd pain since tonight, hx of reflux but states feels different.

## 2015-05-22 ENCOUNTER — Encounter: Payer: Self-pay | Admitting: Family Medicine

## 2015-05-23 ENCOUNTER — Other Ambulatory Visit: Payer: Self-pay | Admitting: Neurology

## 2015-05-23 DIAGNOSIS — R42 Dizziness and giddiness: Secondary | ICD-10-CM

## 2015-05-23 DIAGNOSIS — I609 Nontraumatic subarachnoid hemorrhage, unspecified: Secondary | ICD-10-CM

## 2015-05-28 ENCOUNTER — Ambulatory Visit
Admission: RE | Admit: 2015-05-28 | Discharge: 2015-05-28 | Disposition: A | Discharge status: Home / Self Care | Payer: 59 | Source: Ambulatory Visit | Attending: Neurology | Admitting: Neurology

## 2015-05-28 DIAGNOSIS — R42 Dizziness and giddiness: Secondary | ICD-10-CM | POA: Insufficient documentation

## 2015-05-28 DIAGNOSIS — I609 Nontraumatic subarachnoid hemorrhage, unspecified: Secondary | ICD-10-CM

## 2015-05-28 MED ORDER — GADOBENATE DIMEGLUMINE 529 MG/ML IV SOLN
20.0000 mL | Freq: Once | INTRAVENOUS | Status: AC
  Administered 2015-05-28: 20 mL via INTRAVENOUS

## 2015-06-04 ENCOUNTER — Encounter: Payer: Self-pay | Admitting: Family Medicine

## 2015-06-05 ENCOUNTER — Ambulatory Visit (INDEPENDENT_AMBULATORY_CARE_PROVIDER_SITE_OTHER): Payer: 59 | Admitting: Endocrinology

## 2015-06-05 ENCOUNTER — Encounter: Payer: Self-pay | Admitting: Endocrinology

## 2015-06-05 VITALS — BP 108/68 | HR 87 | Resp 12 | Ht 70.0 in | Wt 223.5 lb

## 2015-06-05 DIAGNOSIS — E7800 Pure hypercholesterolemia, unspecified: Secondary | ICD-10-CM

## 2015-06-05 DIAGNOSIS — E042 Nontoxic multinodular goiter: Secondary | ICD-10-CM | POA: Diagnosis not present

## 2015-06-05 DIAGNOSIS — I10 Essential (primary) hypertension: Secondary | ICD-10-CM

## 2015-06-05 DIAGNOSIS — E1142 Type 2 diabetes mellitus with diabetic polyneuropathy: Secondary | ICD-10-CM | POA: Diagnosis not present

## 2015-06-05 LAB — MICROALBUMIN / CREATININE URINE RATIO
Creatinine,U: 194.5 mg/dL
Microalb Creat Ratio: 1.1 mg/g (ref 0.0–30.0)
Microalb, Ur: 2.1 mg/dL — ABNORMAL HIGH (ref 0.0–1.9)

## 2015-06-05 LAB — COMPREHENSIVE METABOLIC PANEL
ALBUMIN: 4.2 g/dL (ref 3.5–5.2)
ALT: 54 U/L — ABNORMAL HIGH (ref 0–35)
AST: 36 U/L (ref 0–37)
Alkaline Phosphatase: 93 U/L (ref 39–117)
BUN: 11 mg/dL (ref 6–23)
CO2: 29 meq/L (ref 19–32)
Calcium: 9.9 mg/dL (ref 8.4–10.5)
Chloride: 97 mEq/L (ref 96–112)
Creatinine, Ser: 0.61 mg/dL (ref 0.40–1.20)
GFR: 111.18 mL/min (ref >60.00)
GLUCOSE: 158 mg/dL — AB (ref 70–99)
POTASSIUM: 5 meq/L (ref 3.5–5.1)
SODIUM: 136 meq/L (ref 135–145)
Total Bilirubin: 0.8 mg/dL (ref 0.2–1.2)
Total Protein: 7.6 g/dL (ref 6.0–8.3)

## 2015-06-05 LAB — HEMOGLOBIN A1C: Hgb A1c MFr Bld: 7.4 % — ABNORMAL HIGH (ref 4.6–6.5)

## 2015-06-05 MED ORDER — METFORMIN HCL ER 500 MG PO TB24: 1000.0000 mg | ORAL_TABLET | Freq: Two times a day (BID) | ORAL | Status: DC

## 2015-06-05 MED ORDER — ALBIGLUTIDE 30 MG ~~LOC~~ PEN
30.0000 mg | PEN_INJECTOR | SUBCUTANEOUS | Status: DC
Start: 2015-06-05 — End: 2015-07-22

## 2015-06-05 NOTE — Progress Notes (Signed)
Reason for visit-  Gabriella Marsh is a 49 y.o.-year-old female, here for follow up management of Type 2 diabetes, uncontrolled, with complications ( neuropathy). Last visit April 2016.  HPI- Patient has been diagnosed with diabetes in 2002/2003. Recalls being initially on lifestyle modifications.  Tried  Metformin, Glipizide, Actos, Januvia. she has not been on insulin before.   -Januvia stopped March 2016>>changed to GLP-1 and Metformin -Didn't tolerate the regular form of metformin due to diarrhea>>changed to XR form   * since last time was diagnosed with SAH, dural fistula and sees Dr Manuella Ghazi for neurology * having back MRI done for back pain and sciatica * is off work now per Dr Venia Minks  Pt is currently on a regimen of: - tanzeum 30 mg [place] weekly ( start March 2016) - Glipizide 5 mg twice daily (decreased at last visit) -Metformin XR 1000 mg twice daily (start March 2016)>>tolerating better than regular metformin   Last hemoglobin A1c was: Lab Results  Component Value Date   HGBA1C 8.3* 02/20/2015   11/2014- 7.9% 02/2015-8.3%  Pt checks her sugars <1 a day recently . Uses Verio glucometer- just got it as preferred by insurance. By recall/meter download/meter review they are: *hasn't been checking recently as often due to multiple other medical issues and forgetting  PREMEAL Breakfast Lunch Dinner Bedtime Overall  Glucose range:     111-221  Mean/median:        POST-MEAL PC Breakfast PC Lunch PC Dinner  Glucose range:     Mean/median:       Hypoglycemia-  No lows. Lowest sugar was ?; she has hypoglycemia awareness at 70. No recent symptoms.    Dietary habits- eats three times daily. Tries to limit carbs, sweetened beverages, sodas, desserts. Weakness- icecream, not anymore Cherrios/oatmeal BF, apples/banana- snack. Lunch cheese, meat, pretzels. Afternoon snack- grapes Dinner- hot meal, occ night time snack- popcorn, nuts. Made dietary changes last week. 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 Was Working 8 hours daily.Off work now due to medical issues Weight - down recently.  Wt Readings from Last 3 Encounters:  06/05/15 223 lb 8 oz (101.379 kg)  05/28/15 224 lb (101.606 kg)  05/04/15 225 lb (102.059 kg)    Diabetes Complications-  Nephropathy- No  CKD, last BUN/creatinine- GFR 109. mildy elevated K levels recently>>taken off lisinopril and K normalized.  Lab Results  Component Value Date   BUN 13 05/04/2015   CREATININE 0.61 05/04/2015   Lab Results  Component Value Date   K 4.4 05/04/2015     Retinopathy- No, Last DEE was in Feb 2016 Neuropathy- has numbness and tingling in )her feet. Known neuropathy. Being treated with neurontin, nortriptyline. Sees podiatry annually.not controlled Associated history - No CAD . No prior stroke. No hypothyroidism. her last TSH was  Lab Results  Component Value Date   TSH 1.110 03/06/2015    Hyperlipidemia-  her last set of lipids were- was on Lipitor, taken off due to leg cramps about 3 months ago. Tolerating well.  No results found for: CHOL, HDL, LDLCALC, LDLDIRECT, TRIG, CHOLHDL  Lab Results  Component Value Date   AST 48* 05/04/2015   ALT 68* 05/04/2015   She reports being told that she cant the Lipitor while she is on Nortriptline- I am not  aware of this interaction- and I have asked her to check with Dr Venia Minks as to whether she should be restarted on statins, given the recent elevation in liver tests as well, which ?fatty liver   Blood Pressure/HTN- Patient's blood pressure is well controlled today on current regimen. Off ACE-I ( lisinopril) since last visit due to hyperkalemia.  Multinodular goiter- Detected on last exam. She has dominant right and left inferior nodules  that meet criteria for FNA. No FH thyroid cancer or personal XRT exposure. Recent TSH normal. Both these nodules were [facility] 05/01/15 and found to be benign on pathology.   I have reviewed the patient's past medical history,  medications and allergies.   Current Outpatient Prescriptions on File Prior to Visit  Medication Sig Dispense Refill  . ALPRAZolam (XANAX) 0.5 MG tablet Take 0.5 mg by mouth 2 (two) times daily as needed for anxiety.    Marland Kitchen esomeprazole (NEXIUM) 20 MG capsule Take by mouth.    . gabapentin (NEURONTIN) 100 MG capsule Take 2 capsules (200 mg total) by mouth 3 (three) times daily. Along with 600 mg three times daily (TDD 2400 mg daily) 180 capsule 0  . gabapentin (NEURONTIN) 600 MG tablet Take 600 mg by mouth 3 (three) times daily.    Marland Kitchen glipiZIDE (GLUCOTROL) 10 MG tablet Take 5 mg by mouth 2 (two) times daily before a meal.    . glucose blood test strip Check blood sugars twice daily. OneTouch Verio strips. 200 each 5  . lisinopril (PRINIVIL,ZESTRIL) 5 MG tablet Take 5 mg by mouth daily.    . nortriptyline (PAMELOR) 25 MG capsule Take 25 mg by mouth at bedtime.    Marland Kitchen oxyCODONE-acetaminophen (PERCOCET/ROXICET) 5-325 MG per tablet Take 1 tablet by mouth every 6 (six) hours as needed for severe pain.    Marland Kitchen venlafaxine (EFFEXOR) 75 MG tablet Take 75 mg by mouth 2 (two) times daily.     No current facility-administered medications on file prior to visit.   Allergies  Allergen Reactions  . Celecoxib   . Pregabalin      Review of Systems- [ x ]  Complains of    [  ]  denies [  ] Recent weight change [ x ]  Fatigue [  ] polydipsia [  ] polyuria [  ]  nocturia [  ]  vision difficulty [  ] chest pain [  ] shortness of breath [  ] leg swelling [  ] cough [  ] nausea/vomiting [  ] diarrhea [  ] constipation [  ] abdominal pain [ x ]  tingling/numbness in extremities [  ]  concern with feet ( wounds/sores)  PE: BP 108/68 mmHg  Pulse 87  Resp 12  Ht 5\' 10"  (1.778 m)  Wt 223 lb 8  oz (101.379 kg)  BMI 32.07 kg/m2  SpO2 97% Wt Readings from Last 3 Encounters:  06/05/15 223 lb 8 oz (101.379 kg)  05/28/15 224 lb (101.606 kg)  05/04/15 225 lb (102.059 kg)   Exam: deferred  ASSESSMENT AND PLAN: Problem List Items Addressed This Visit      Cardiovascular and Mediastinum   Essential hypertension    BP at target today. Update urine MA at this visit.   Off ACE-I since last visit due to hyperkalemia.  This might be reintroduced at next few visits with careful monitoring of her K levels.         Relevant Orders   Comprehensive metabolic panel   Hemoglobin  A1c   Microalbumin / creatinine urine ratio     Endocrine   Multinodular goiter     FNA of right thyroid nodule and left inferior nodule was benign 05/01/15. Recommend 1 year follow up thyroid US. Recent TSH normal. No compressive symptoms.          Relevant Orders   Comprehensive metabolic panel   Hemoglobin A1c   Microalbumin / creatinine urine ratio     Nervous and Auditory   Diabetic neuropathy - Primary    Recent A1c not controlled and trending up. Update today.  Congratulated her on making dietary changes and the recent weight loss.  Encouraged her to check sugars 2xdaily and bring meter to all appointments. Husband is going to remind her to check sugars 2 x daily.  If she starts to see them going up, then she is aware to call us.   She will continue current metformin, tanzeum and  Glipiizde at 5 mg twice daily.  Continue neurontin at 800 mg three times daily. Symptoms are tolerable.  Report back if does not tolerate or not helpful, then would add on Cymbalta.  She is having further workup done with the pain clinic for the back pain and leg pain.            Relevant Medications   Albiglutide (TANZEUM) 30 MG PEN   metFORMIN (GLUCOPHAGE-XR) 500 MG 24 hr tablet   Other Relevant Orders   Comprehensive metabolic panel   Hemoglobin A1c   Microalbumin / creatinine urine ratio     Other    Hyperlipidemia   Relevant Orders   Comprehensive metabolic panel   Hemoglobin A1c   Microalbumin / creatinine urine ratio   Hypercholesteremia     Has mild transaminitis. Now off statin due to leg cramping. No recent lipid testing. Follow up with PCP for this.               - Return to clinic in 6 weeks with sugar log/meter. Explained that [place] am [place] out of [place] and she has elected to follow up with my colleagues at [employer].   Reagan Behlke [facility] 06/05/2015 9:57 AM

## 2015-06-05 NOTE — Progress Notes (Signed)
Pre visit review using our clinic review tool, if applicable. No additional management support is needed unless otherwise documented below in the visit note. 

## 2015-06-05 NOTE — Assessment & Plan Note (Signed)
FNA of right thyroid nodule and left inferior nodule was benign 05/01/15. Recommend 1 year follow up thyroid US. Recent TSH normal. No compressive symptoms.

## 2015-06-05 NOTE — Assessment & Plan Note (Signed)
BP at target today. Update urine MA at this visit.   Off ACE-I since last visit due to hyperkalemia.  This might be reintroduced at next few visits with careful monitoring of her K levels.

## 2015-06-05 NOTE — Assessment & Plan Note (Signed)
Recent A1c not controlled and trending up. Update today.  Congratulated her on making dietary changes and the recent weight loss.  Encouraged her to check sugars 2xdaily and bring meter to all appointments. Husband is going to remind her to check sugars 2 x daily.  If she starts to see them going up, then she is aware to call us.   She will continue current metformin, tanzeum and  Glipiizde at 5 mg twice daily.  Continue neurontin at 800 mg three times daily. Symptoms are tolerable.  Report back if does not tolerate or not helpful, then would add on Cymbalta.  She is having further workup done with the pain clinic for the back pain and leg pain.

## 2015-06-05 NOTE — Assessment & Plan Note (Signed)
Has mild transaminitis. Now off statin due to leg cramping. No recent lipid testing. Follow up with PCP for this.

## 2015-06-05 NOTE — Patient Instructions (Signed)
Labs today. Continue current meds for diabetes.   Please come back for a follow-up appointment in 6 weeks [place] location

## 2015-06-10 ENCOUNTER — Other Ambulatory Visit: Payer: Self-pay

## 2015-06-10 DIAGNOSIS — G64 Other disorders of peripheral nervous system: Secondary | ICD-10-CM

## 2015-06-10 MED ORDER — GABAPENTIN 600 MG PO TABS: 600.0000 mg | ORAL_TABLET | Freq: Three times a day (TID) | ORAL | Status: DC

## 2015-06-11 ENCOUNTER — Other Ambulatory Visit (HOSPITAL_COMMUNITY): Payer: Self-pay | Admitting: Neurosurgery

## 2015-06-11 ENCOUNTER — Other Ambulatory Visit: Payer: Self-pay | Admitting: Neurosurgery

## 2015-06-11 DIAGNOSIS — I609 Nontraumatic subarachnoid hemorrhage, unspecified: Secondary | ICD-10-CM

## 2015-06-11 DIAGNOSIS — Q2730 Arteriovenous malformation, site unspecified: Secondary | ICD-10-CM

## 2015-06-11 DIAGNOSIS — I729 Aneurysm of unspecified site: Secondary | ICD-10-CM

## 2015-06-18 DIAGNOSIS — G43019 Migraine without aura, intractable, without status migrainosus: Secondary | ICD-10-CM | POA: Insufficient documentation

## 2015-06-26 ENCOUNTER — Other Ambulatory Visit: Payer: Self-pay | Admitting: Family Medicine

## 2015-06-26 ENCOUNTER — Telehealth: Payer: Self-pay | Admitting: Family Medicine

## 2015-06-26 DIAGNOSIS — E1142 Type 2 diabetes mellitus with diabetic polyneuropathy: Secondary | ICD-10-CM

## 2015-06-26 MED ORDER — OXYCODONE-ACETAMINOPHEN 5-325 MG PO TABS: 1.0000 | ORAL_TABLET | Freq: Four times a day (QID) | ORAL | Status: DC | PRN

## 2015-06-26 NOTE — Telephone Encounter (Signed)
Prescription printed. Please notify patient it is ready for pick up. Thanks- Dr. Giovannie Scerbo.  

## 2015-06-26 NOTE — Telephone Encounter (Signed)
Patient needs refill on oxyCODONE-acetaminophen (PERCOCET/ROXICET) 5-325 MG per tablet

## 2015-06-26 NOTE — Telephone Encounter (Signed)
FYI Pt needs new disability form filled out by her PCP per Dr. Manuella Ghazi. Diagnosis is subarachnoid hemorrhage, and AVM Brain. The progress note by Dr. Manuella Ghazi is in [place]. Pt's associated sx include altered mental status (forgetfullness) and dizziness. Pt has angiogram scheduled scheduled for Friday. Pt currently on out of work until 07/28. Pt believes she will need more time off, though she does not know how much. Renaldo Fiddler, CMA

## 2015-06-26 NOTE — Telephone Encounter (Signed)
Pt would like to speak with Mickel Baas about what is going on with neuro and disability. Pt request that Mickel Baas return her call. Thanks TNP

## 2015-06-28 ENCOUNTER — Other Ambulatory Visit (HOSPITAL_COMMUNITY): Payer: Self-pay | Admitting: Neurosurgery

## 2015-06-28 ENCOUNTER — Ambulatory Visit (HOSPITAL_COMMUNITY)
Admission: RE | Admit: 2015-06-28 | Discharge: 2015-06-28 | Disposition: A | Discharge status: Home / Self Care | Payer: 59 | Source: Ambulatory Visit | Attending: Neurosurgery | Admitting: Neurosurgery

## 2015-06-28 DIAGNOSIS — I10 Essential (primary) hypertension: Secondary | ICD-10-CM | POA: Insufficient documentation

## 2015-06-28 DIAGNOSIS — E785 Hyperlipidemia, unspecified: Secondary | ICD-10-CM | POA: Insufficient documentation

## 2015-06-28 DIAGNOSIS — Z79899 Other long term (current) drug therapy: Secondary | ICD-10-CM | POA: Diagnosis not present

## 2015-06-28 DIAGNOSIS — Z87891 Personal history of nicotine dependence: Secondary | ICD-10-CM | POA: Insufficient documentation

## 2015-06-28 DIAGNOSIS — I609 Nontraumatic subarachnoid hemorrhage, unspecified: Secondary | ICD-10-CM | POA: Insufficient documentation

## 2015-06-28 DIAGNOSIS — Q2730 Arteriovenous malformation, site unspecified: Secondary | ICD-10-CM

## 2015-06-28 DIAGNOSIS — E119 Type 2 diabetes mellitus without complications: Secondary | ICD-10-CM | POA: Insufficient documentation

## 2015-06-28 DIAGNOSIS — R930 Abnormal findings on diagnostic imaging of skull and head, not elsewhere classified: Secondary | ICD-10-CM | POA: Insufficient documentation

## 2015-06-28 LAB — CBC WITH DIFFERENTIAL/PLATELET
Basophils Absolute: 0 10*3/uL (ref 0.0–0.1)
Basophils Relative: 0 % (ref 0–1)
EOS PCT: 2 % (ref 0–5)
Eosinophils Absolute: 0.2 10*3/uL (ref 0.0–0.7)
HEMATOCRIT: 39.6 % (ref 36.0–46.0)
Hemoglobin: 13.5 g/dL (ref 12.0–15.0)
LYMPHS ABS: 3.4 10*3/uL (ref 0.7–4.0)
LYMPHS PCT: 43 % (ref 12–46)
MCH: 28.9 pg (ref 26.0–34.0)
MCHC: 34.1 g/dL (ref 30.0–36.0)
MCV: 84.8 fL (ref 78.0–100.0)
Monocytes Absolute: 0.6 10*3/uL (ref 0.1–1.0)
Monocytes Relative: 7 % (ref 3–12)
Neutro Abs: 3.8 10*3/uL (ref 1.7–7.7)
Neutrophils Relative %: 48 % (ref 43–77)
PLATELETS: 290 10*3/uL (ref 150–400)
RBC: 4.67 MIL/uL (ref 3.87–5.11)
RDW: 12.8 % (ref 11.5–15.5)
WBC: 7.9 10*3/uL (ref 4.0–10.5)

## 2015-06-28 LAB — BASIC METABOLIC PANEL
ANION GAP: 9 (ref 5–15)
BUN: 9 mg/dL (ref 6–20)
CALCIUM: 9.6 mg/dL (ref 8.9–10.3)
CO2: 28 mmol/L (ref 22–32)
CREATININE: 0.59 mg/dL (ref 0.44–1.00)
Chloride: 98 mmol/L — ABNORMAL LOW (ref 101–111)
GFR calc Af Amer: >60 mL/min (ref >60)
GLUCOSE: 196 mg/dL — AB (ref 65–99)
POTASSIUM: 4.3 mmol/L (ref 3.5–5.1)
Sodium: 135 mmol/L (ref 135–145)

## 2015-06-28 LAB — PROTIME-INR
INR: 0.92 (ref 0.00–1.49)
PROTHROMBIN TIME: 12.6 s (ref 11.6–15.2)

## 2015-06-28 LAB — GLUCOSE, CAPILLARY
Glucose-Capillary: 197 mg/dL — ABNORMAL HIGH (ref 65–99)
Glucose-Capillary: 143 mg/dL — ABNORMAL HIGH (ref 65–99)

## 2015-06-28 MED ORDER — LIDOCAINE HCL 1 % IJ SOLN
INTRAMUSCULAR | Status: AC
  Filled 2015-06-28: qty 20

## 2015-06-28 MED ORDER — IOHEXOL 300 MG/ML  SOLN
150.0000 mL | Freq: Once | INTRAMUSCULAR | Status: AC | PRN
  Administered 2015-06-28: 50 mL via INTRA_ARTERIAL

## 2015-06-28 MED ORDER — MIDAZOLAM HCL 2 MG/2ML IJ SOLN
INTRAMUSCULAR | Status: AC
  Filled 2015-06-28: qty 2

## 2015-06-28 MED ORDER — FENTANYL CITRATE (PF) 100 MCG/2ML IJ SOLN
INTRAMUSCULAR | Status: AC | PRN
  Administered 2015-06-28: 25 ug via INTRAVENOUS

## 2015-06-28 MED ORDER — MIDAZOLAM HCL 2 MG/2ML IJ SOLN
INTRAMUSCULAR | Status: AC | PRN
  Administered 2015-06-28: 0.5 mg via INTRAVENOUS

## 2015-06-28 MED ORDER — FENTANYL CITRATE (PF) 100 MCG/2ML IJ SOLN
INTRAMUSCULAR | Status: AC
  Filled 2015-06-28: qty 2

## 2015-06-28 MED ORDER — HEPARIN SOD (PORK) LOCK FLUSH 100 UNIT/ML IV SOLN
INTRAVENOUS | Status: AC
  Filled 2015-06-28: qty 20

## 2015-06-28 MED ORDER — HEPARIN SODIUM (PORCINE) 1000 UNIT/ML IJ SOLN
INTRAMUSCULAR | Status: AC | PRN
  Administered 2015-06-28: 2000 [IU] via INTRAVENOUS

## 2015-06-28 MED ORDER — SODIUM CHLORIDE 0.9 % IV SOLN
INTRAVENOUS | Status: DC
  Administered 2015-06-28: 09:00:00 via INTRAVENOUS

## 2015-06-28 MED ORDER — HYDROCODONE-ACETAMINOPHEN 5-325 MG PO TABS: 1.0000 | ORAL_TABLET | ORAL | Status: DC | PRN

## 2015-06-28 NOTE — H&P (Signed)
CC:  Brain hemorrhage  HPI: Gabriella Marsh is a 49 y.o. female initially seen in the outpatient clinic after previous admission to [facility] for sudden onset of altered mental status. She underwent CT which did demonstrate small amount of convexity SAH. Outpatient MRI demonstrated the same, with the possibility of frontal AVM. She therefore presents for diagnostic cerebral angiogram.  PMH: Past Medical History  Diagnosis Date  . Hypertension   . Diabetes mellitus without complication   . Hyperlipidemia     PSH: Past Surgical History  Procedure Laterality Date  . Cesarean section    . Abdominal hysterectomy    . Shoulder surgery Left     SH: History  Substance Use Topics  . Smoking status: Former Research scientist (life sciences)  . Smokeless tobacco: Never Used  . Alcohol Use: No    MEDS: Prior to Admission medications   Medication Sig Start Date End Date Taking? Authorizing Provider  Albiglutide (TANZEUM) 30 MG PEN Inject 30 mg into the skin once a week. 06/05/15  Yes Radhika Concha Se, MD  ALPRAZolam (XANAX) 0.5 MG tablet Take 0.5 mg by mouth 2 (two) times daily as needed for anxiety.   Yes Historical Provider, MD  gabapentin (NEURONTIN) 100 MG capsule Take 2 capsules (200 mg total) by mouth 3 (three) times daily. Along with 600 mg three times daily (TDD 2400 mg daily) Patient taking differently: Take 100 mg by mouth 3 (three) times daily. Along with 600 mg three times daily (TDD 2400 mg daily) 04/17/15  Yes Radhika P Phadke, MD  gabapentin (NEURONTIN) 600 MG tablet Take 1 tablet (600 mg total) by mouth 3 (three) times daily. 06/10/15  Yes Birdie Sons, MD  glipiZIDE (GLUCOTROL) 10 MG tablet Take 5 mg by mouth 2 (two) times daily before a meal.   Yes Historical Provider, MD  glucose blood test strip Check blood sugars twice daily. OneTouch Verio strips. 03/06/15  Yes Radhika P Phadke, MD  metFORMIN (GLUCOPHAGE-XR) 500 MG 24 hr tablet Take 2 tablets (1,000 mg total) by mouth 2 (two) times daily with a meal.  06/05/15  Yes Radhika P Phadke, MD  nortriptyline (PAMELOR) 25 MG capsule Take 25 mg by mouth at bedtime.   Yes Historical Provider, MD  oxyCODONE-acetaminophen (PERCOCET/ROXICET) 5-325 MG per tablet Take 1 tablet by mouth every 6 (six) hours as needed for severe pain. 06/26/15  Yes Margarita Rana, MD  venlafaxine (EFFEXOR) 75 MG tablet Take 75 mg by mouth 2 (two) times daily.   Yes Historical Provider, MD    ALLERGY: Allergies  Allergen Reactions  . Pregabalin Shortness Of Breath  . Celecoxib Other (See Comments)    Unknown    ROS: ROS  NEUROLOGIC EXAM: Awake, alert, oriented Memory and concentration grossly intact Speech fluent, appropriate CN grossly intact Motor exam: Upper Extremities Deltoid Bicep Tricep Grip  Right 5/5 5/5 5/5 5/5  Left 5/5 5/5 5/5 5/5   Lower Extremity IP Quad PF DF EHL  Right 5/5 5/5 5/5 5/5 5/5  Left 5/5 5/5 5/5 5/5 5/5   Sensation grossly intact to LT  IMGAING: There is prior left frontal convexity SAH. There is subtle suggestion of abnormal vasculature over the left frontal convexity which may suggest underlying AVM  IMPRESSION: - 49 y.o. female with prior convexity SAH with possible AVM  PLAN: - Proceed with diagnostic cerebral angiogram  I have reviewed the risks, benefits, and alternatives to angiogram with the patient in the office. All questions were answered and consent was obtained.

## 2015-06-28 NOTE — Discharge Instructions (Signed)
Arteriogram °Care After °These instructions give you information on caring for yourself after your procedure. Your doctor may also give you more specific instructions. Call your doctor if you have any problems or questions after your procedure. °HOME CARE °· Keep your leg straight for at least 6 hours. °· Do not bathe, swim, or use a hot tub until directed by your doctor. You can shower. °· Do not lift anything heavier than 10 pounds (about a gallon of milk) for 2 days. °· Do not walk a lot, run, or drive for 2 days. °· Return to normal activities in 2 days or as told by your doctor. °Finding out the results of your test °Ask when your test results will be ready. Make sure you get your test results. °GET HELP RIGHT AWAY IF:  °· You have fever. °· You have more pain in your leg. °· The leg that was cut is: °¨ Bleeding. °¨ Puffy (swollen) or red. °¨ Cold. °¨ Pale or changes color. °¨ Weak. °¨ Tingly or numb. °If you go to the Emergency Room, tell your nurse that you have had an arteriogram. Take this paper with you to show the nurse. °MAKE SURE YOU: °· Understand these instructions. °· Will watch your condition. °· Will get help right away if you are not doing well or get worse. °Document Released: 03/05/2009 Document Revised: 12/12/2013 Document Reviewed: 03/05/2009 °[employer]® Patient Information ©2015 [employer]. This information is not intended to replace advice given to you by your health care provider. Make sure you discuss any questions you have with your health care provider. ° ° ° °NO METFORMIN FOR 2 DAYS °

## 2015-06-28 NOTE — Op Note (Signed)
DIAGNOSTIC CEREBRAL ANGIOGRAM    OPERATOR:   Dr. Consuella Lose, MD  HISTORY:   The patient is a 49 y.o. yo female with a history of brain hemorrhage and possible AVM seen on MRI. She presents for further w/u with diagnostic cerebral angiogram.  APPROACH:   The technical aspects of the procedure as well as its potential risks and benefits were reviewed with the patient. These risks included but were not limited bleeding, infection, allergic reaction, damage to organs/vital structures, stroke, non-diagnostic procedure, and the catastrophic outcomes of heart attack, coma, and death. With an understanding of these risks, informed consent was obtained and witnessed.    The patient was placed in the supine position on the angiography table and the skin of right groin prepped in the usual sterile fashion. The procedure was performed under local anesthesia (1%-solution of bicarbonate-bufferred Lidoacaine) and conscious sedation with Versed and fentanyl monitored by the in-suite nurse.    A 5- French sheath was introduced in the right common femoral artery using Seldinger technique.  A fluorophase sequence was used to document the sheath position.    HEPARIN: 2000 Units total.   CONTRAST AGENT: 80cc, Omnipaque 300   FLUOROSCOPY TIME: 3.5 combined AP and lateral minutes    CATHETER(S) AND WIRE(S):    5-French JB-1 glidecatheter   0.035" glidewire    VESSELS CATHETERIZED:   Right internal carotid   Right external carotid Left internal carotid   Left external carotid Right vertebral   Left vertebral   Right common femoral  VESSELS STUDIED:   Right internal carotid Right external carotid, head Right vertebral Left internal carotid Left external carotid, head Left vertebral Right femoral  PROCEDURAL NARRATIVE:   A 5-Fr JB-1 terumo glide catheter was advanced over a 0.035 glidewire into the aortic arch. The above vessels were then sequentially catheterized and cervical/cerebral  angiograms taken. After review of images, the catheter was removed without incident.    INTERPRETATION:   Right internal carotid:   Injection reveals the presence of a widely patent ICA, M1, and A1 segments and their branches. There is no significant stenosis, occlusion, aneurysm or high flow vascular malformation visualized.  The parenchymal and venous phases are normal. The venous sinuses are widely patent.    Right external carotid: The visualized cranial branches of the right external carotid artery are unremarkable. There is no evidence of fistula, or early intracranial venous drainage.  Left internal carotid: Injection reveals the presence of a widely patent ICA, A1, and M1 segments and their branches. There is no significant stenosis, occlusion, aneurysm, or high flow vascular malformation visualized. The parenchymal and venous phases are normal. The venous sinuses are widely patent.    Left external carotid, head: The visualized cranial branches of the left external carotid artery are unremarkable. There is no evidence of dural fistula.  Left vertebral:   Injection reveals the presence of a widely patent vertebral artery. This leads to a widely patent basilar artery that terminates in bilateral P1. The basilar apex is normal. There is no significant stenosis, occlusion, aneurysm, or vascular malformation visualized. The parenchymal and venous phases are normal. The venous sinuses are widely patent.    Right vertebral:    Normal vessel. No PICA aneurysm. See basilar description above.    Right femoral:    Normal vessel. No significant atherosclerotic disease. Arterial sheath in adequate position.   DISPOSITION:  Upon completion of the study, the femoral sheath was removed and hemostasis obtained using a 5-Fr ExoSeal closure  device. Good proximal and distal lower extremity pulses were documented upon achievement of hemostasis.    The procedure was well tolerated and no early  complications were observed.       The patient was transferred back to the holding area to be positioned flat in bed for 3 hours of observation.    IMPRESSION:  1. Normal 6 vessel cerebral angiogram. There is no evidence of intracranial aneurysm, arteriovenous malformation, or high flow fistula.  The preliminary results of this procedure were shared with the patient and the patient's family.

## 2015-07-01 ENCOUNTER — Ambulatory Visit (INDEPENDENT_AMBULATORY_CARE_PROVIDER_SITE_OTHER): Payer: 59 | Admitting: Family Medicine

## 2015-07-01 ENCOUNTER — Encounter: Payer: Self-pay | Admitting: Family Medicine

## 2015-07-01 VITALS — BP 132/88 | HR 92 | Temp 98.0°F | Resp 16 | Wt 224.0 lb

## 2015-07-01 DIAGNOSIS — E1142 Type 2 diabetes mellitus with diabetic polyneuropathy: Secondary | ICD-10-CM

## 2015-07-01 DIAGNOSIS — K219 Gastro-esophageal reflux disease without esophagitis: Secondary | ICD-10-CM | POA: Diagnosis not present

## 2015-07-01 DIAGNOSIS — M5137 Other intervertebral disc degeneration, lumbosacral region: Secondary | ICD-10-CM

## 2015-07-01 DIAGNOSIS — S066X0S Traumatic subarachnoid hemorrhage without loss of consciousness, sequela: Secondary | ICD-10-CM

## 2015-07-01 NOTE — Progress Notes (Signed)
Subjective:    Patient ID: Gabriella Marsh, female    DOB: 1966-03-31, 50 y.o.   MRN: [id number]  Gastrophageal Reflux She complains of coughing, heartburn and nausea (With the reflux). She reports no abdominal pain, no chest pain, no choking, no dysphagia, no hoarse voice, no sore throat or no wheezing. This is a new problem. The current episode started more than 1 month ago. The problem occurs constantly. The problem has been gradually worsening. Associated symptoms include fatigue. She has tried an antacid for the symptoms. The treatment provided mild relief.   Did have angiogram last Friday. No blockages found. Did have a subarachnoid hemorrhage. Is improving.    Still has chronic pain in low back from DDD. And foot pain from neuropathy.    Is waiting on clearance from neurology before treatment for back can be pursued.  May need injections or surgery.     Review of Systems  Constitutional: Positive for fatigue. Negative for fever, chills, diaphoresis, activity change, appetite change and unexpected weight change.  HENT: Negative for hoarse voice and sore throat.   Respiratory: Positive for cough. Negative for apnea, choking, chest tightness, shortness of breath, wheezing and stridor.   Cardiovascular: Negative for chest pain, palpitations and leg swelling.  Gastrointestinal: Positive for heartburn, nausea (With the reflux) and abdominal distention. Negative for dysphagia, vomiting, abdominal pain, diarrhea, constipation, blood in stool, anal bleeding and rectal pain.  Genitourinary: Positive for menstrual problem.  Musculoskeletal: Positive for myalgias, back pain and arthralgias. Negative for joint swelling, gait problem, neck pain and neck stiffness.  Neurological: Positive for dizziness and light-headedness. Negative for tremors, seizures, syncope, facial asymmetry, speech difficulty, weakness and headaches. Numbness: and memroy loss.   Psychiatric/Behavioral: Positive for decreased  concentration.   Patient Active Problem List   Diagnosis Date Noted  . Adaptation reaction 05/03/2015  . DDD (degenerative disc disease), lumbosacral 05/03/2015  . Diabetes 05/03/2015  . Hypercholesteremia 05/03/2015  . BP (high blood pressure) 05/03/2015  . Insomnia due to medical condition 05/03/2015  . Neuropathy 05/03/2015  . Adiposity 05/03/2015  . Disorder of peripheral nervous system 05/03/2015  . Restless leg 05/03/2015  . Snores 05/03/2015  . Thyroid nodule 05/03/2015  . Diabetic neuropathy 03/06/2015  . Essential hypertension 03/06/2015  . Elevated liver function tests 03/06/2015  . Obesity 03/06/2015  . Hyperlipidemia 03/06/2015  . GERD (gastroesophageal reflux disease) 03/06/2015  . Multinodular goiter 03/06/2015  . RLS (restless legs syndrome) 03/06/2015  . LBP (low back pain) 03/06/2015  . Leg cramps 03/06/2015   Past Medical History  Diagnosis Date  . Hypertension   . Diabetes mellitus without complication   . Hyperlipidemia    Current Outpatient Prescriptions on File Prior to Visit  Medication Sig  . Albiglutide (TANZEUM) 30 MG PEN Inject 30 mg into the skin once a week.  . ALPRAZolam (XANAX) 0.5 MG tablet Take 0.5 mg by mouth 2 (two) times daily as needed for anxiety.  . gabapentin (NEURONTIN) 100 MG capsule Take 2 capsules (200 mg total) by mouth 3 (three) times daily. Along with 600 mg three times daily (TDD 2400 mg daily) (Patient taking differently: Take 100 mg by mouth 3 (three) times daily. Along with 600 mg three times daily (TDD 2400 mg daily))  . gabapentin (NEURONTIN) 600 MG tablet Take 1 tablet (600 mg total) by mouth 3 (three) times daily.  Marland Kitchen glipiZIDE (GLUCOTROL) 10 MG tablet Take 5 mg by mouth 2 (two) times daily before a meal.  . glucose  blood test strip Check blood sugars twice daily. OneTouch Verio strips.  . metFORMIN (GLUCOPHAGE-XR) 500 MG 24 hr tablet Take 2 tablets (1,000 mg total) by mouth 2 (two) times daily with a meal.  .  nortriptyline (PAMELOR) 25 MG capsule Take 25 mg by mouth at bedtime.  Marland Kitchen oxyCODONE-acetaminophen (PERCOCET/ROXICET) 5-325 MG per tablet Take 1 tablet by mouth every 6 (six) hours as needed for severe pain.  Marland Kitchen venlafaxine (EFFEXOR) 75 MG tablet Take 75 mg by mouth 2 (two) times daily.   No current facility-administered medications on file prior to visit.   Allergies  Allergen Reactions  . Pregabalin Shortness Of Breath  . Celecoxib Other (See Comments)    Unknown   Past Surgical History  Procedure Laterality Date  . Cesarean section    . Shoulder surgery Left   . Abdominal hysterectomy      But still has cervix  . Tonsillectomy and adenoidectomy    . Uterine fibroid surgery    . Ovarian cyst surgery    . Cholecystectomy     History   Social History  . Marital Status: Divorced    Spouse Name: N/A  . Number of Children: 1  . Years of Education: H/S   Occupational History  .  [employer]    Full-Time   Social History Main Topics  . Smoking status: Former Research scientist (life sciences)  . Smokeless tobacco: Never Used  . Alcohol Use: No  . Drug Use: No  . Sexual Activity: Not on file   Other Topics Concern  . Not on file   Social History Narrative   Family History  Problem Relation Age of Onset  . Osteoporosis Mother   . Irritable bowel syndrome Mother   . Diabetes Father   . Heart disease Father   . Hypertension Father   . Hyperlipidemia Father   . Congestive Heart Failure Father   . Osteoporosis Maternal Grandmother   . Healthy Brother      .result     Objective:   Physical Exam  Constitutional: She is oriented to person, place, and time. She appears well-developed and well-nourished.  Cardiovascular: Normal rate and regular rhythm.   Pulmonary/Chest: Effort normal and breath sounds normal.  Musculoskeletal:  Walking with cane.   Neurological: She is alert and oriented to person, place, and time.  Psychiatric: She has a normal mood and affect. Her behavior is normal. Judgment  and thought content normal.  Vitals reviewed.  BP 132/88 mmHg  Pulse 92  Temp(Src) 98 F (36.7 C) (Oral)  Resp 16  Wt 224 lb (101.606 kg)     Assessment & Plan:   1. Gastroesophageal reflux disease without esophagitis Worsening recently. Continue medication.    2. Diabetic polyneuropathy associated with type 2 diabetes mellitus Neuropathy with slightly better control on current medication, but still with some pain.  Takes medication regularly.   3. DDD (degenerative disc disease), lumbosacral Needs to be cleared neurologically before can proceed with definitive plan for back.   4. Subarachnoid hematoma, without loss of consciousness, sequela Followed by neurology. Improving.   Margarita Rana, MD

## 2015-07-03 ENCOUNTER — Other Ambulatory Visit: Payer: Self-pay | Admitting: Endocrinology

## 2015-07-17 ENCOUNTER — Ambulatory Visit (INDEPENDENT_AMBULATORY_CARE_PROVIDER_SITE_OTHER): Payer: 59 | Admitting: Family Medicine

## 2015-07-17 ENCOUNTER — Telehealth: Payer: Self-pay | Admitting: Family Medicine

## 2015-07-17 ENCOUNTER — Encounter: Payer: Self-pay | Admitting: Family Medicine

## 2015-07-17 VITALS — BP 128/80 | HR 88 | Temp 97.7°F | Resp 16 | Ht 71.0 in | Wt 220.0 lb

## 2015-07-17 DIAGNOSIS — E7800 Pure hypercholesterolemia, unspecified: Secondary | ICD-10-CM

## 2015-07-17 DIAGNOSIS — F419 Anxiety disorder, unspecified: Secondary | ICD-10-CM

## 2015-07-17 DIAGNOSIS — I10 Essential (primary) hypertension: Secondary | ICD-10-CM | POA: Diagnosis not present

## 2015-07-17 DIAGNOSIS — E1142 Type 2 diabetes mellitus with diabetic polyneuropathy: Secondary | ICD-10-CM

## 2015-07-17 DIAGNOSIS — E119 Type 2 diabetes mellitus without complications: Secondary | ICD-10-CM | POA: Diagnosis not present

## 2015-07-17 MED ORDER — ATORVASTATIN CALCIUM 20 MG PO TABS: 20.0000 mg | ORAL_TABLET | Freq: Every day | ORAL | Status: DC

## 2015-07-17 MED ORDER — NORTRIPTYLINE HCL 25 MG PO CAPS: 50.0000 mg | ORAL_CAPSULE | Freq: Every day | ORAL | Status: DC

## 2015-07-17 NOTE — Progress Notes (Signed)
Patient ID: Gabriella Marsh, female   DOB: [birth year], 49 y.o.   MRN: [id number]         Patient: Gabriella Marsh Female    DOB: [birth year]   49 y.o.   MRN: [id number] Visit Date: 07/17/2015  Today's Provider: Margarita Rana, MD   Chief Complaint  Patient presents with  . Diabetes  . Hypertension  . Hyperlipidemia   Subjective:    Diabetes She presents for her follow-up diabetic visit. She has type 2 diabetes mellitus. Her disease course has been stable (Last A1C 7.6% on 07/12/2015). Hypoglycemia symptoms include dizziness (Has a history of Vertigo.). Pertinent negatives for hypoglycemia include no confusion, headaches, nervousness/anxiousness, seizures, speech difficulty or tremors. Associated symptoms include polydipsia and polyuria. Pertinent negatives for diabetes include no chest pain, no fatigue, no polyphagia and no weakness. There are no hypoglycemic complications. Symptoms are stable. Diabetic complications include peripheral neuropathy. There are no known risk factors for coronary artery disease. Current diabetic treatment includes oral agent (dual therapy). She is compliant with treatment all of the time. Her weight is stable. Her overall blood glucose range is 140-180 mg/dl. She sees a podiatrist.Eye exam is current.  Hypertension This is a chronic problem. The problem is unchanged. The problem is controlled. Associated symptoms include anxiety. Pertinent negatives include no chest pain, headaches, neck pain, palpitations or shortness of breath. Risk factors for coronary artery disease include diabetes mellitus and dyslipidemia. There are no compliance problems.   Hyperlipidemia This is a chronic problem. The problem is uncontrolled. Recent lipid tests were reviewed and are high. Associated symptoms include myalgias. Pertinent negatives include no chest pain or shortness of breath. There are no compliance problems.  Risk factors for coronary artery disease include diabetes mellitus,  dyslipidemia and hypertension.  Anxiety Presents for follow-up visit. Symptoms include dizziness (Has a history of Vertigo.) and insomnia. Patient reports no chest pain, confusion, decreased concentration, nausea, nervous/anxious behavior, palpitations, shortness of breath or suicidal ideas. The quality of sleep is poor.         Allergies  Allergen Reactions  . Pregabalin Shortness Of Breath  . Celecoxib Other (See Comments)    Unknown   Previous Medications   ALBIGLUTIDE (TANZEUM) 30 MG PEN    Inject 30 mg into the skin once a week.   ALPRAZOLAM (XANAX) 0.5 MG TABLET    Take 0.5 mg by mouth 2 (two) times daily as needed for anxiety.   GABAPENTIN (NEURONTIN) 100 MG CAPSULE    Take 2 capsules (200 mg total) by mouth 3 (three) times daily. Along with 600 mg three times daily (TDD 2400 mg daily)   GABAPENTIN (NEURONTIN) 600 MG TABLET    Take 1 tablet (600 mg total) by mouth 3 (three) times daily.   GLIPIZIDE (GLUCOTROL) 10 MG TABLET    Take 5 mg by mouth 2 (two) times daily before a meal.   GLUCOSE BLOOD TEST STRIP    Check blood sugars twice daily. OneTouch Verio strips.   METFORMIN (GLUCOPHAGE-XR) 500 MG 24 HR TABLET    Take 2 tablets (1,000 mg total) by mouth 2 (two) times daily with a meal.   NORTRIPTYLINE (PAMELOR) 25 MG CAPSULE    Take 25 mg by mouth at bedtime.   OXYCODONE-ACETAMINOPHEN (PERCOCET/ROXICET) 5-325 MG PER TABLET    Take 1 tablet by mouth every 6 (six) hours as needed for severe pain.   VENLAFAXINE (EFFEXOR) 75 MG TABLET    Take 75 mg by mouth 2 (two) times daily.  Review of Systems  Constitutional: Negative for fever, chills, diaphoresis, activity change, appetite change, fatigue and unexpected weight change.  Respiratory: Negative for apnea, cough, choking, chest tightness, shortness of breath, wheezing and stridor.   Cardiovascular: Negative for chest pain, palpitations and leg swelling.  Gastrointestinal: Negative for nausea, vomiting, abdominal pain, diarrhea,  constipation, blood in stool, abdominal distention, anal bleeding and rectal pain.  Endocrine: Positive for polydipsia and polyuria. Negative for cold intolerance, heat intolerance and polyphagia.  Musculoskeletal: Positive for myalgias, back pain and arthralgias. Negative for joint swelling, gait problem, neck pain and neck stiffness.  Neurological: Positive for dizziness (Has a history of Vertigo.) and numbness. Negative for tremors, seizures, syncope, facial asymmetry, speech difficulty, weakness, light-headedness and headaches.  Psychiatric/Behavioral: Positive for sleep disturbance. Negative for suicidal ideas, hallucinations, behavioral problems, confusion, self-injury, dysphoric mood, decreased concentration and agitation. The patient has insomnia. The patient is not nervous/anxious and is not hyperactive.     History  Substance Use Topics  . Smoking status: Former Research scientist (life sciences)  . Smokeless tobacco: Never Used  . Alcohol Use: No   Objective:   BP 128/80 mmHg  Pulse 88  Temp(Src) 97.7 F (36.5 C) (Oral)  Resp 16  Ht 5\' 11"  (1.803 m)  Wt 220 lb (99.791 kg)  BMI 30.70 kg/m2  Physical Exam  Constitutional: She is oriented to person, place, and time. She appears well-developed and well-nourished.  Neurological: She is alert and oriented to person, place, and time.  Psychiatric: She has a normal mood and affect. Her behavior is normal. Judgment and thought content normal.      Assessment & Plan:     1. Essential hypertension Stable.   2. Diabetic polyneuropathy associated with type 2 diabetes mellitus Will follow up with endocrinology and podiatry as scheduled.    3. Hypercholesteremia Will restart medication. Unclear if caused worsening pain previously. Will monitor. Recheck labs in 6 weeks.  - atorvastatin (LIPITOR) 20 MG tablet; Take 1 tablet (20 mg total) by mouth daily.  Dispense: 90 tablet; Refill: 3  4. Anxiety Stable. Improving as medical problems seem to be improving.   Still with some trouble sleeping. Will increase Nortriptyline. Further plan pending these results.        Margarita Rana, MD  [facility]

## 2015-07-17 NOTE — Telephone Encounter (Signed)
Please notify patient. Sent in rx for higher dose of Nortriptyline. Have her start taking 50 mg every night. Thanks.

## 2015-07-18 NOTE — Telephone Encounter (Signed)
Advised pt of new medication dose. Pt verbalized fully understanding.

## 2015-07-22 ENCOUNTER — Encounter: Payer: Self-pay | Admitting: Internal Medicine

## 2015-07-22 ENCOUNTER — Ambulatory Visit (INDEPENDENT_AMBULATORY_CARE_PROVIDER_SITE_OTHER): Payer: 59 | Admitting: Internal Medicine

## 2015-07-22 VITALS — BP 120/80 | HR 90 | Temp 97.7°F | Resp 16 | Ht 70.0 in | Wt 223.0 lb

## 2015-07-22 DIAGNOSIS — I10 Essential (primary) hypertension: Secondary | ICD-10-CM | POA: Diagnosis not present

## 2015-07-22 DIAGNOSIS — E042 Nontoxic multinodular goiter: Secondary | ICD-10-CM | POA: Diagnosis not present

## 2015-07-22 DIAGNOSIS — E7800 Pure hypercholesterolemia, unspecified: Secondary | ICD-10-CM

## 2015-07-22 DIAGNOSIS — E1142 Type 2 diabetes mellitus with diabetic polyneuropathy: Secondary | ICD-10-CM

## 2015-07-22 DIAGNOSIS — IMO0002 Reserved for concepts with insufficient information to code with codable children: Secondary | ICD-10-CM | POA: Insufficient documentation

## 2015-07-22 DIAGNOSIS — E1165 Type 2 diabetes mellitus with hyperglycemia: Secondary | ICD-10-CM | POA: Insufficient documentation

## 2015-07-22 MED ORDER — ALBIGLUTIDE 30 MG ~~LOC~~ PEN: 30.0000 mg | PEN_INJECTOR | SUBCUTANEOUS | Status: DC

## 2015-07-22 NOTE — Progress Notes (Signed)
Patient ID: Gabriella Marsh, female   DOB: 1966-04-05, 49 y.o.   MRN: [id number]  HPI: Gabriella Marsh is a 49 y.o.-year-old female, referred by her PCP, Gabriella Rana, MD, for management of DM2, dx in 2003, non-insulin-dependent, uncontrolled, with complications (peripherally neuropathy) and multinodular goiter. She previously saw Dr. Howell Rucks, who since left practice.   Last hemoglobin A1c was: 07/12/2015: HbA1c 7.2% 05/04/2015: HbA1c 7.8% Lab Results  Component Value Date   HGBA1C 7.4* 06/05/2015   HGBA1C 8.3* 02/20/2015  She is trying to improve her diet.  Pt is on a regimen of: - Metformin XR 1000 mg 2x a day, with meals - Glipizide 5 mg twice a day - Tanzeum 30 mg weekly started 02/2015  Pt checks her sugars 2x a day and they are: - am: 95-114 - rarely 160-210 - 2h after b'fast: n/c - before lunch: n/c - 2h after lunch: n/c - before dinner: n/c - 2h after dinner: 150-275 - bedtime: n/c - nighttime: n/c No lows. Lowest sugar was 90s; she has hypoglycemia awareness at 70.  Highest sugar was 275  Glucometer: ?  Pt's meals are: - Breakfast: cereals (honey nut) + milk (2%) - Lunch + dinner (2-3 pm): meat + starch + veggies - Snacks: popcorn low salt; fiber one bars, bananas and oranges, sugar free icecream  She saw nutrition in the past with her first husband who ended up dying from DM1.  - no CKD, last BUN/creatinine:  Lab Results  Component Value Date   BUN 9 06/28/2015   CREATININE 0.59 06/28/2015   - + HL. Last set of lipids:   She will start Lipitor. - last eye exam was in 01/2015. No DR.  - + numbness and tingling in her feet. For her back pain, she is on gabapentin 600 mg 4 times a day.  She is also on nortriptyline, venlafaxine, alprazolam.  Pt has FH of DM in father - DM2.   Patient also has a history of nontoxic multinodular goiter, with 2 dominant nodules biopsied in 04/2015, with benign results.  She is out of work for back pain. She also has a  history of hypertension and hyperlipidemia.  ROS: Constitutional: no weight gain/loss, + fatigue, no subjective hyperthermia/hypothermia, + poor sleep Eyes: no blurry vision, no xerophthalmia ENT: no sore throat, no nodules palpated in throat, no dysphagia/odynophagia, no hoarseness Cardiovascular: no CP/SOB/palpitations/+ leg swelling Respiratory: no cough/SOB Gastrointestinal: no N/V/D/C, + acid reflux Musculoskeletal: + muscle/+ joint aches Skin: no rashes Neurological: no tremors/numbness/tingling/+ dizziness Psychiatric: + both: depression/anxiety + low libido  Past Medical History  Diagnosis Date  . Hypertension   . Diabetes mellitus without complication   . Hyperlipidemia    Past Surgical History  Procedure Laterality Date  . Cesarean section    . Shoulder surgery Left   . Abdominal hysterectomy      But still has cervix  . Tonsillectomy and adenoidectomy    . Uterine fibroid surgery    . Ovarian cyst surgery    . Cholecystectomy     History   Social History  . Marital Status: Divorced    Spouse Name: N/A  . Number of Children: 1  . Years of Education: H/S   Occupational History  . 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 Smoking status: Former Research scientist (life sciences), quit in 2012  . Smokeless tobacco: Never Used  . Alcohol Use: No  . Drug Use:  No   Current Outpatient Prescriptions on File Prior to Visit  Medication Sig Dispense Refill  . Albiglutide (TANZEUM) 30 MG PEN Inject 30 mg into the skin once a week. 4 each 3  . ALPRAZolam (XANAX) 0.5 MG tablet Take 0.5 mg by mouth 2 (two) times daily as needed for anxiety.    Marland Kitchen atorvastatin (LIPITOR) 20 MG tablet Take 1 tablet (20 mg total) by mouth daily. 90 tablet 3  . gabapentin (NEURONTIN) 100 MG capsule Take 2 capsules (200 mg total) by mouth 3 (three) times daily. Along with 600 mg three times daily (TDD 2400 mg daily) (Patient taking differently: Take 100 mg by mouth 3  (three) times daily. Along with 600 mg three times daily (TDD 2400 mg daily)) 180 capsule 0  . gabapentin (NEURONTIN) 600 MG tablet Take 1 tablet (600 mg total) by mouth 3 (three) times daily. 270 tablet 1  . glipiZIDE (GLUCOTROL) 10 MG tablet Take 5 mg by mouth 2 (two) times daily before a meal.    . glucose blood test strip Check blood sugars twice daily. OneTouch Verio strips. 200 each 5  . metFORMIN (GLUCOPHAGE-XR) 500 MG 24 hr tablet Take 2 tablets (1,000 mg total) by mouth 2 (two) times daily with a meal. 120 tablet 1  . nortriptyline (PAMELOR) 25 MG capsule Take 2 capsules (50 mg total) by mouth at bedtime. 180 capsule 3  . oxyCODONE-acetaminophen (PERCOCET/ROXICET) 5-325 MG per tablet Take 1 tablet by mouth every 6 (six) hours as needed for severe pain. 60 tablet 0  . venlafaxine (EFFEXOR) 75 MG tablet Take 75 mg by mouth 2 (two) times daily.     No current facility-administered medications on file prior to visit.   Allergies  Allergen Reactions  . Pregabalin Shortness Of Breath  . Celecoxib Other (See Comments)    Unknown   Family History  Problem Relation Age of Onset  . Osteoporosis Mother   . Irritable bowel syndrome Mother   . Diabetes Father   . Heart disease Father   . Hypertension Father   . Hyperlipidemia Father   . Congestive Heart Failure Father   . Osteoporosis Maternal Grandmother   . Healthy Brother    PE: BP 120/80 mmHg  Pulse 90  Temp(Src) 97.7 F (36.5 C) (Oral)  Resp 16  Ht 5\' 10"  (1.778 m)  Wt 223 lb (101.152 kg)  BMI 32.00 kg/m2  SpO2 96% Wt Readings from Last 3 Encounters:  07/22/15 223 lb (101.152 kg)  07/17/15 220 lb (99.791 kg)  07/01/15 224 lb (101.606 kg)   Constitutional: overweight, in NAD Eyes: PERRLA, EOMI, no exophthalmos ENT: moist mucous membranes, no thyromegaly, no cervical lymphadenopathy Cardiovascular: RRR, No MRG, + mild periankle edema Respiratory: CTA B Gastrointestinal: abdomen soft, NT, ND, BS+ Musculoskeletal: no  deformities, strength intact in all 4 Skin: moist, warm, no rashes except left heel dyshidrotic eczema Neurological: no tremor with outstretched hands, DTR normal in all 4  ASSESSMENT: 1. DM2, non-insulin-dependent, uncontrolled, with complications - PN  2.  Multinodular goiter  PLAN:  1. Patient with long-standing, uncontrolled diabetes, on oral antidiabetic regimen +  Injectable GLP 1 receptor agonist, with greatly improved control after adding Tanzeum. Will continue the same regimen and discussed about improving diet , how to correctly check her sugars, and also to start exercise (water aerobics).  She had problems with Tanzeum refills after Dr. Howell Rucks left practice and was off Tanzeum for 2 weeks. I sent a new refills today. - I suggested  to:  Patient Instructions  Please continue: - Metformin XR 1000 mg 2x a day, with meals - Glipizide 5 mg 2x a day, before meals - Tanzeum 30 mg weekly   Please return in 3 months with your sugar log.   Please let me know if the sugars are consistently <80 or >200.  - Strongly advised her to  check sugars at different times of the day - check 2 times a day, rotating checks - given sugar log and advised how to fill it and to bring it at next appt  - given foot care handout and explained the principles  - given instructions for hypoglycemia management "15-15 rule"  - advised for yearly eye exams >>  She is up-to-date - advised to sleep without her fuzzy socks on to allow her skin to breathe to improve her dyshidrotic eczema on heel - Return to clinic in 3 mo with sugar log   2. Nontoxic multinodular goiter - no neck compression symptoms - Reviewed the reports of her ultrasound and recent dominant nodule biopsies: benign - Discussed the above with patient and her husband - we'll continue to follow - will likely need a new ultrasound after 2 years  - time spent with the patient: 40 minutes, of which >50% was spent in obtaining information about  her diabetes and goiter, reviewing her previous labs, evaluations, and treatments, counseling her about her conditions (please see the discussed topics above), and developing a plan to further investigate it; she had a number of questions which I addressed.

## 2015-07-22 NOTE — Patient Instructions (Addendum)
Please continue: - Metformin XR 1000 mg 2x a day, with meals - Glipizide 5 mg 2x a day, before meals - Tanzeum 30 mg weekly   Please return in 3 months with your sugar log.   Please let me know if the sugars are consistently <80 or >200.  PATIENT INSTRUCTIONS FOR TYPE 2 DIABETES:  DIET AND EXERCISE Diet and exercise is an important part of diabetic treatment.  We recommended aerobic exercise in the form of brisk walking (working between 40-60% of maximal aerobic capacity, similar to brisk walking) for 150 minutes per week (such as 30 minutes five days per week) along with 3 times per week performing 'resistance' training (using various gauge rubber tubes with handles) 5-10 exercises involving the major muscle groups (upper body, lower body and core) performing 10-15 repetitions (or near fatigue) each exercise. Start at half the above goal but build slowly to reach the above goals. If limited by weight, joint pain, or disability, we recommend daily walking in a swimming pool with water up to waist to reduce pressure from joints while allow for adequate exercise.    BLOOD GLUCOSES Monitoring your blood glucoses is important for continued management of your diabetes. Please check your blood glucoses 2-4 times a day: fasting, before meals and at bedtime (you can rotate these measurements - e.g. one day check before the 3 meals, the next day check before 2 of the meals and before bedtime, etc.).   HYPOGLYCEMIA (low blood sugar) Hypoglycemia is usually a reaction to not eating, exercising, or taking too much insulin/ other diabetes drugs.  Symptoms include tremors, sweating, hunger, confusion, headache, etc. Treat IMMEDIATELY with 15 grams of Carbs: . 4 glucose tablets .  cup regular juice/soda . 2 tablespoons raisins . 4 teaspoons sugar . 1 tablespoon honey Recheck blood glucose in 15 mins and repeat above if still symptomatic/blood glucose <100.  RECOMMENDATIONS TO REDUCE YOUR RISK OF  DIABETIC COMPLICATIONS: * Take your prescribed MEDICATION(S) * Follow a DIABETIC diet: Complex carbs, fiber rich foods, (monounsaturated and polyunsaturated) fats * AVOID saturated/trans fats, high fat foods, >2,300 mg salt per day. * EXERCISE at least 5 times a week for 30 minutes or preferably daily.  * DO NOT SMOKE OR DRINK more than 1 drink a day. * Check your FEET every day. Do not wear tightfitting shoes. Contact us if you develop an ulcer * See your EYE doctor once a year or more if needed * Get a FLU shot once a year * Get a PNEUMONIA vaccine once before and once after age 27 years  GOALS:  * Your Hemoglobin A1c of <7%  * fasting sugars need to be <130 * after meals sugars need to be <180 (2h after you start eating) * Your Systolic BP should be 892 or lower  * Your Diastolic BP should be 80 or lower  * Your HDL (Good Cholesterol) should be 40 or higher  * Your LDL (Bad Cholesterol) should be 100 or lower. * Your Triglycerides should be 150 or lower  * Your Urine microalbumin (kidney function) should be <30 * Your Body Mass Index should be 25 or lower    Please consider the following ways to cut down carbs and fat and increase fiber and micronutrients in your diet: - substitute whole grain for white bread or pasta - substitute brown rice for white rice - substitute 90-calorie flat bread pieces for slices of bread when possible - substitute sweet potatoes or yams for white potatoes -  substitute humus for margarine - substitute tofu for cheese when possible - substitute almond or rice milk for regular milk (would not drink soy milk daily due to concern for soy estrogen influence on breast cancer risk) - substitute dark chocolate for other sweets when possible - substitute water - can add lemon or orange slices for taste - for diet sodas (artificial sweeteners will trick your body that you can eat sweets without getting calories and will lead you to overeating and weight gain in  the long run) - do not skip breakfast or other meals (this will slow down the metabolism and will result in more weight gain over time)  - can try smoothies made from fruit and almond/rice milk in am instead of regular breakfast - can also try old-fashioned (not instant) oatmeal made with almond/rice milk in am - order the dressing on the side when eating salad at a restaurant (pour less than half of the dressing on the salad) - eat as little meat as possible - can try juicing, but should not forget that juicing will get rid of the fiber, so would alternate with eating raw veg./fruits or drinking smoothies - use as little oil as possible, even when using olive oil - can dress a salad with a mix of balsamic vinegar and lemon juice, for e.g. - use agave nectar, stevia sugar, or regular sugar rather than artificial sweateners - steam or broil/roast veggies  - snack on veggies/fruit/nuts (unsalted, preferably) when possible, rather than processed foods - reduce or eliminate aspartame in diet (it is in diet sodas, chewing gum, etc) Read the labels!  Try to read Dr. Janene Harvey book: "Program for Reversing Diabetes" for other ideas for healthy eating.

## 2015-08-09 ENCOUNTER — Other Ambulatory Visit: Payer: Self-pay | Admitting: Family Medicine

## 2015-08-09 DIAGNOSIS — E1340 Other specified diabetes mellitus with diabetic neuropathy, unspecified: Secondary | ICD-10-CM

## 2015-08-09 DIAGNOSIS — E1142 Type 2 diabetes mellitus with diabetic polyneuropathy: Secondary | ICD-10-CM

## 2015-08-09 MED ORDER — OXYCODONE-ACETAMINOPHEN 5-325 MG PO TABS: 1.0000 | ORAL_TABLET | Freq: Four times a day (QID) | ORAL | Status: DC | PRN

## 2015-08-09 NOTE — Telephone Encounter (Signed)
Pt contacted office for refill request on the following medications: ° °oxyCODONE-acetaminophen (PERCOCET/ROXICET) 5-325 MG  ° °CB#[phone]/MW °

## 2015-08-09 NOTE — Telephone Encounter (Signed)
Prescription printed

## 2015-08-12 NOTE — Telephone Encounter (Signed)
Pt advised.   Thanks,   -Laura  

## 2015-08-12 NOTE — Telephone Encounter (Signed)
Pt returning call.  PS#[phone]/KT

## 2015-08-22 ENCOUNTER — Telehealth: Payer: Self-pay | Admitting: Family Medicine

## 2015-08-22 DIAGNOSIS — E1142 Type 2 diabetes mellitus with diabetic polyneuropathy: Secondary | ICD-10-CM

## 2015-08-22 DIAGNOSIS — I10 Essential (primary) hypertension: Secondary | ICD-10-CM

## 2015-08-22 DIAGNOSIS — E7800 Pure hypercholesterolemia, unspecified: Secondary | ICD-10-CM

## 2015-08-22 DIAGNOSIS — IMO0002 Reserved for concepts with insufficient information to code with codable children: Secondary | ICD-10-CM

## 2015-08-22 DIAGNOSIS — E1165 Type 2 diabetes mellitus with hyperglycemia: Secondary | ICD-10-CM

## 2015-08-22 NOTE — Telephone Encounter (Signed)
Sounds great. Ok  To put in order. Thanks.

## 2015-08-22 NOTE — Telephone Encounter (Signed)
I think the pain is due to have her cholesterol recheck, should I also add her A1C.  (She's a Armed forces logistics/support/administrative officer.)  Thanks,   Mickel Baas

## 2015-08-22 NOTE — Telephone Encounter (Signed)
Lab order placed and patient notified to pick up copy of lab order at front desk. sd

## 2015-08-22 NOTE — Telephone Encounter (Signed)
Pt called wanting to know if she could pick up her lab order tomorrow am.  She has an appt on the 7th with you.  Her call back is [phone]  Thanks Con Memos

## 2015-08-28 ENCOUNTER — Encounter: Payer: Self-pay | Admitting: Family Medicine

## 2015-08-28 ENCOUNTER — Ambulatory Visit (INDEPENDENT_AMBULATORY_CARE_PROVIDER_SITE_OTHER): Payer: 59 | Admitting: Family Medicine

## 2015-08-28 VITALS — BP 102/60 | HR 72 | Temp 97.8°F | Resp 16 | Wt 226.0 lb

## 2015-08-28 DIAGNOSIS — E1142 Type 2 diabetes mellitus with diabetic polyneuropathy: Secondary | ICD-10-CM | POA: Diagnosis not present

## 2015-08-28 DIAGNOSIS — E7800 Pure hypercholesterolemia, unspecified: Secondary | ICD-10-CM

## 2015-08-28 DIAGNOSIS — M5137 Other intervertebral disc degeneration, lumbosacral region: Secondary | ICD-10-CM | POA: Diagnosis not present

## 2015-08-28 DIAGNOSIS — IMO0002 Reserved for concepts with insufficient information to code with codable children: Secondary | ICD-10-CM

## 2015-08-28 DIAGNOSIS — E1165 Type 2 diabetes mellitus with hyperglycemia: Secondary | ICD-10-CM

## 2015-08-28 NOTE — Progress Notes (Signed)
Patient: Gabriella Marsh Female    DOB: 1966/09/22   49 y.o.   MRN: [id number] Visit Date: 08/28/2015  Today's Provider: Margarita Rana, MD   Chief Complaint  Patient presents with  . Follow-up    Labs   Subjective:    HPI Patient is a 49 year old female, who come in today for a six week follow up and to recheck labs  Which patient states will probably go today or tomorrow. Patient also states that has got two injections of Cortisone Steroids. The second injection patient got it yesterday for lower back pain. Patient goes to Dr. Marlaine Hind at [facility].   Told to wait about a week to know if has made a difference. Hoping will not need to do surgery.    Also seeing a new doctor for diabetes. Scheduled to go back in November.  Motivated to improve.    Does still need her cholesterol checked.      Allergies  Allergen Reactions  . Pregabalin Shortness Of Breath  . Celecoxib Other (See Comments)    Unknown   Previous Medications   ALBIGLUTIDE (TANZEUM) 30 MG PEN    Inject 30 mg into the skin once a week.   ALPRAZOLAM (XANAX) 0.5 MG TABLET    Take 0.5 mg by mouth 2 (two) times daily as needed for anxiety.   ATORVASTATIN (LIPITOR) 20 MG TABLET    Take 1 tablet (20 mg total) by mouth daily.   GABAPENTIN (NEURONTIN) 100 MG CAPSULE    Take 2 capsules (200 mg total) by mouth 3 (three) times daily. Along with 600 mg three times daily (TDD 2400 mg daily)   GABAPENTIN (NEURONTIN) 600 MG TABLET    Take 1 tablet (600 mg total) by mouth 3 (three) times daily.   GLIPIZIDE (GLUCOTROL) 10 MG TABLET    Take 5 mg by mouth 2 (two) times daily before a meal.   GLUCOSE BLOOD TEST STRIP    Check blood sugars twice daily. OneTouch Verio strips.   METFORMIN (GLUCOPHAGE-XR) 500 MG 24 HR TABLET    Take 2 tablets (1,000 mg total) by mouth 2 (two) times daily with a meal.   NORTRIPTYLINE (PAMELOR) 25 MG CAPSULE    Take 2 capsules (50 mg total) by mouth at bedtime.   OXYCODONE-ACETAMINOPHEN (PERCOCET/ROXICET) 5-325 MG PER TABLET    Take 1 tablet by mouth every 6 (six) hours as needed for severe pain.   VENLAFAXINE (EFFEXOR) 75 MG TABLET    Take 75 mg by mouth 2 (two) times daily.    Review of Systems  Constitutional: Negative.   HENT: Negative.   Eyes: Negative.   Respiratory: Negative.   Cardiovascular: Negative.   Gastrointestinal: Negative.   Endocrine: Negative.   Genitourinary: Negative.   Musculoskeletal: Positive for back pain.  Skin: Negative.   Allergic/Immunologic: Negative.   Neurological: Negative.        "Just her Vertigo"  Hematological: Negative.     Social History  Substance Use Topics  . Smoking status: Former Research scientist (life sciences)  . Smokeless tobacco: Never Used  . Alcohol Use: No   Objective:   BP 102/60 mmHg  Pulse 72  Temp(Src) 97.8 F (36.6 C) (Oral)  Resp 16  Wt 226 lb (102.513 kg)  Physical Exam  Constitutional: She appears well-developed and well-nourished.  Neurological: She is alert.  Psychiatric: She has a normal mood and affect. Her behavior is normal. Thought content normal.  Assessment & Plan:      1. Hypercholesteremia Tolerating medication. Will recheck labs today.   2. Uncontrolled type 2 diabetes mellitus with peripheral neuropathy Following up with Endocrinology.    3. DDD-  Follow up with pain specialist.       Margarita Rana, MD  [facility]

## 2015-08-29 ENCOUNTER — Telehealth: Payer: Self-pay

## 2015-08-29 LAB — HEMOGLOBIN A1C
ESTIMATED AVERAGE GLUCOSE: 232 mg/dL
Hgb A1c MFr Bld: 9.7 % — ABNORMAL HIGH (ref 4.8–5.6)

## 2015-08-29 LAB — LIPID PANEL WITH LDL/HDL RATIO
CHOLESTEROL TOTAL: 167 mg/dL (ref 100–199)
HDL: 50 mg/dL (ref >39)
LDL CALC: 76 mg/dL (ref 0–99)
LDl/HDL Ratio: 1.5 ratio units (ref 0.0–3.2)
TRIGLYCERIDES: 205 mg/dL — AB (ref 0–149)
VLDL Cholesterol Cal: 41 mg/dL — ABNORMAL HIGH (ref 5–40)

## 2015-08-29 NOTE — Telephone Encounter (Signed)
-----   Message from Margarita Rana, MD sent at 08/29/2015  6:57 AM EDT ----- Blood sugar much too high at 9.7  Make sure she follows up with endocrinologist.  Thanks.

## 2015-08-29 NOTE — Telephone Encounter (Signed)
Pt advised as directed below; she has a follow up appointment with endocrinology.    Thanks,   -Mickel Baas

## 2015-09-19 ENCOUNTER — Other Ambulatory Visit: Payer: Self-pay | Admitting: Family Medicine

## 2015-09-19 DIAGNOSIS — E1340 Other specified diabetes mellitus with diabetic neuropathy, unspecified: Secondary | ICD-10-CM

## 2015-09-19 MED ORDER — OXYCODONE-ACETAMINOPHEN 5-325 MG PO TABS: 1.0000 | ORAL_TABLET | Freq: Four times a day (QID) | ORAL | Status: DC | PRN

## 2015-09-19 NOTE — Telephone Encounter (Signed)
Ok to print rx. Thanks.

## 2015-09-19 NOTE — Telephone Encounter (Signed)
Pt contacted office for refill request on the following medications: ° °oxyCODONE-acetaminophen (PERCOCET/ROXICET) 5-325 MG  ° °CB#[phone]/MW °

## 2015-09-20 ENCOUNTER — Other Ambulatory Visit: Payer: Self-pay

## 2015-09-20 DIAGNOSIS — E1340 Other specified diabetes mellitus with diabetic neuropathy, unspecified: Secondary | ICD-10-CM

## 2015-09-20 MED ORDER — OXYCODONE-ACETAMINOPHEN 5-325 MG PO TABS: 1.0000 | ORAL_TABLET | Freq: Four times a day (QID) | ORAL | Status: DC | PRN

## 2015-09-20 NOTE — Telephone Encounter (Signed)
Printed, waiting for provider to sign, will call patient when ready.  ED

## 2015-09-20 NOTE — Telephone Encounter (Signed)
Pt advised to pick up rx at front office. sd

## 2015-10-21 ENCOUNTER — Telehealth: Payer: Self-pay | Admitting: Family Medicine

## 2015-10-21 NOTE — Telephone Encounter (Signed)
Pt's short term disability has expired.  She is now going into long term disability.  She needs a note for her employer stating she is still out of work.  Please call patient [phone]  Thanks Con Memos

## 2015-10-21 NOTE — Telephone Encounter (Signed)
Signed rx. Thanks!

## 2015-10-21 NOTE — Telephone Encounter (Signed)
Pt needs a note for her supervisor stating that she is still out of work.  She is currently working on getting long term disability through [place].  She has not received paper work yet, but expects it any day.  I advised her she may have to see a disability specialist to get the forms completed and she agreed.  She says when she receives them she will bring them by to have you review.   Thanks,   -Mickel Baas

## 2015-10-22 ENCOUNTER — Encounter: Payer: Self-pay | Admitting: Internal Medicine

## 2015-10-22 ENCOUNTER — Ambulatory Visit (INDEPENDENT_AMBULATORY_CARE_PROVIDER_SITE_OTHER): Payer: 59 | Admitting: Internal Medicine

## 2015-10-22 ENCOUNTER — Other Ambulatory Visit (INDEPENDENT_AMBULATORY_CARE_PROVIDER_SITE_OTHER): Payer: 59 | Admitting: *Deleted

## 2015-10-22 VITALS — BP 122/80 | HR 110 | Temp 97.6°F | Resp 14 | Wt 227.0 lb

## 2015-10-22 DIAGNOSIS — E1165 Type 2 diabetes mellitus with hyperglycemia: Secondary | ICD-10-CM

## 2015-10-22 DIAGNOSIS — IMO0002 Reserved for concepts with insufficient information to code with codable children: Secondary | ICD-10-CM

## 2015-10-22 DIAGNOSIS — E042 Nontoxic multinodular goiter: Secondary | ICD-10-CM

## 2015-10-22 DIAGNOSIS — E1142 Type 2 diabetes mellitus with diabetic polyneuropathy: Secondary | ICD-10-CM

## 2015-10-22 LAB — POCT GLYCOSYLATED HEMOGLOBIN (HGB A1C): HEMOGLOBIN A1C: 9.2

## 2015-10-22 MED ORDER — CANAGLIFLOZIN 100 MG PO TABS: 100.0000 mg | ORAL_TABLET | Freq: Every day | ORAL | Status: DC

## 2015-10-22 NOTE — Patient Instructions (Signed)
Please continue: - Metformin XR 1000 mg 2x a day, with meals - Glipizide 5 mg 2x a day, before meals - Tanzeum 30 mg weekly   Please add Invokana 100 mg in a, before b'fast.  Please return in 1.5 months with your sugar log.   Please let me know if the sugars are consistently <80 or >200.

## 2015-10-22 NOTE — Progress Notes (Signed)
Patient ID: Gabriella Marsh, female   DOB: 1966-11-23, 49 y.o.   MRN: [id number]  HPI: Gabriella Marsh is a 49 y.o.-year-old female, returning for follow-up for DM2, dx in 2003, non-insulin-dependent, uncontrolled, with complications (peripherally neuropathy) and multinodular goiter. Last visit 3 mo ago.  Ins: [employer].  DM2: Last hemoglobin A1c was: Lab Results  Component Value Date   HGBA1C 9.7* 08/28/2015   HGBA1C 7.4* 06/05/2015   HGBA1C 8.3* 02/20/2015  07/12/2015: HbA1c 7.2% 05/04/2015: HbA1c 7.8%  She had more dietary indiscretions, but tries to keep more active.   Pt is on a regimen of: - Metformin XR 1000 mg 2x a day, with meals - Glipizide 5 mg 2x a day - Tanzeum 30 mg weekly started 02/2015  Pt checks her sugars 2x a day and they are: - am: 95-114 - rarely 160-210 >> 120-150 - 2h after b'fast: n/c >> 150-220, better in last week - before lunch: n/c - 2h after lunch: n/c - before dinner: n/c - 2h after dinner: 150-275 >> 110-140 - bedtime: n/c - nighttime: n/c No lows. Lowest sugar was 90s >> 110; she has hypoglycemia awareness at 70.  Highest sugar was 275 >> 230  Glucometer: ?  Pt's meals are: - Breakfast: cereals (honey nut) + milk (2%) - Lunch + dinner (2-3 pm): meat + starch + veggies - Snacks: popcorn low salt; fiber one bars, bananas and oranges, sugar free icecream  She saw nutrition in the past with her first husband who ended up dying from DM1.  - no CKD, last BUN/creatinine:  Lab Results  Component Value Date   BUN 9 06/28/2015   CREATININE 0.59 06/28/2015   - + HL. Last set of lipids:   She will start Lipitor. - last eye exam was in 01/2015. No DR.  - + numbness and tingling in her feet. For her back pain, she is on gabapentin 600 mg 4 times a day.  She is also on nortriptyline, venlafaxine, alprazolam.  Pt has FH of DM in father - DM2.   Patient also has a history of nontoxic multinodular goiter, with 2 dominant nodules biopsied in 04/2015,  with benign results.  She is out of work for back pain. She also has a history of hypertension and hyperlipidemia.  ROS: Constitutional: + weight gain, + fatigue, no subjective hyperthermia/hypothermia, + poor sleep Eyes: no blurry vision, no xerophthalmia ENT: no sore throat, no nodules palpated in throat, no dysphagia/odynophagia, no hoarseness Cardiovascular: no CP/SOB/palpitations/+ leg swelling Respiratory: no cough/SOB Gastrointestinal: no N/V/D/C, + acid reflux Musculoskeletal: + muscle/+ joint aches Skin: no rashes Neurological: no tremors/numbness/tingling/+ dizziness  I reviewed pt's medications, allergies, PMH, social hx, family hx, and changes were documented in the history of present illness. Otherwise, unchanged from my initial visit note.  Past Medical History  Diagnosis Date  . Hypertension   . Diabetes mellitus without complication ([place])   . Hyperlipidemia    Past Surgical History  Procedure Laterality Date  . Cesarean section    . Shoulder surgery Left   . Abdominal hysterectomy      But still has cervix  . Tonsillectomy and adenoidectomy    . Uterine fibroid surgery    . Ovarian cyst surgery    . Cholecystectomy     History   Social History  . Marital Status: Divorced    Spouse Name: N/A  . Number of Children: 1  . Years of Education: H/S   Occupational History  . 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 Smoking status: Former Research scientist (life sciences), quit in 2012  . Smokeless tobacco: Never Used  . Alcohol Use: No  . Drug Use: No   Current Outpatient Prescriptions on File Prior to Visit  Medication Sig Dispense Refill  . Albiglutide (TANZEUM) 30 MG PEN Inject 30 mg into the skin once a week. 4 each 5  . ALPRAZolam (XANAX) 0.5 MG tablet Take 0.5 mg by mouth 2 (two) times daily as needed for anxiety.    Marland Kitchen atorvastatin (LIPITOR) 20 MG tablet Take 1 tablet (20 mg total) by mouth daily. 90 tablet 3  . gabapentin  (NEURONTIN) 100 MG capsule Take 2 capsules (200 mg total) by mouth 3 (three) times daily. Along with 600 mg three times daily (TDD 2400 mg daily) (Patient taking differently: Take 100 mg by mouth 3 (three) times daily. Along with 600 mg three times daily (TDD 2400 mg daily)) 180 capsule 0  . gabapentin (NEURONTIN) 600 MG tablet Take 1 tablet (600 mg total) by mouth 3 (three) times daily. (Patient taking differently: Take 600 mg by mouth at bedtime. ) 270 tablet 1  . glipiZIDE (GLUCOTROL) 10 MG tablet Take 5 mg by mouth 2 (two) times daily before a meal.    . glucose blood test strip Check blood sugars twice daily. OneTouch Verio strips. 200 each 5  . metFORMIN (GLUCOPHAGE-XR) 500 MG 24 hr tablet Take 2 tablets (1,000 mg total) by mouth 2 (two) times daily with a meal. 120 tablet 1  . nortriptyline (PAMELOR) 25 MG capsule Take 2 capsules (50 mg total) by mouth at bedtime. 180 capsule 3  . oxyCODONE-acetaminophen (PERCOCET/ROXICET) 5-325 MG tablet Take 1 tablet by mouth every 6 (six) hours as needed for severe pain. 60 tablet 0  . venlafaxine (EFFEXOR) 75 MG tablet Take 75 mg by mouth 2 (two) times daily.     No current facility-administered medications on file prior to visit.   Allergies  Allergen Reactions  . Pregabalin Shortness Of Breath  . Celecoxib Other (See Comments)    Unknown   Family History  Problem Relation Age of Onset  . Osteoporosis Mother   . Irritable bowel syndrome Mother   . Diabetes Father   . Heart disease Father   . Hypertension Father   . Hyperlipidemia Father   . Congestive Heart Failure Father   . Osteoporosis Maternal Grandmother   . Healthy Brother    PE: BP 122/80 mmHg  Pulse 110  Temp(Src) 97.6 F (36.4 C) (Oral)  Resp 14  Wt 227 lb (102.967 kg)  SpO2 97%Body mass index is 32.57 kg/(m^2). Wt Readings from Last 3 Encounters:  10/22/15 227 lb (102.967 kg)  08/28/15 226 lb (102.513 kg)  07/22/15 223 lb (101.152 kg)   Constitutional: overweight, in  NAD Eyes: PERRLA, EOMI, no exophthalmos ENT: moist mucous membranes, no thyromegaly, no cervical lymphadenopathy Cardiovascular: tachycardiaRR, No MRG, no LE edema Respiratory: CTA B Gastrointestinal: abdomen soft, NT, ND, BS+ Musculoskeletal: no deformities, strength intact in all 4 Skin: moist, warm, no rashes except left heel dyshidrotic eczema Neurological: no tremor with outstretched hands, DTR normal in all 4  ASSESSMENT: 1. DM2, non-insulin-dependent, uncontrolled, with complications - PN  2.  Multinodular goiter  PLAN:  1. Patient with long-standing, uncontrolled diabetes, on oral antidiabetic regimen +  Injectable GLP 1 receptor agonist, with greatly improved control after adding Tanzeum, but recent Hba1c is high again >> again discussed diet control  Will also add Invokana - we discussed about SEs of Invokana, which are: dizziness (advised to be careful when stands from sitting position), decreased BP - usually not < normal (BP today is not low), and fungal UTIs (advised to let me know if develops one).  - given discount card for Invokana - I suggested to:  Patient Instructions  Please continue: - Metformin XR 1000 mg 2x a day, with meals - Glipizide 5 mg 2x a day, before meals - Tanzeum 30 mg weekly   Please add Invokana 100 mg in am, before b'fast.  Please return in 1.5 months with your sugar log.   Please let me know if the sugars are consistently <80 or >200.  - check sugars at different times of the day - check 2 times a day, rotating checks - advised for yearly eye exams >>  She is up-to-date - Hba1c was checked today by mistake >> 9.2% (a little better) - Return to clinic in 1.5 mo with sugar log>> needs BMP then  2. Nontoxic multinodular goiter - no neck compression symptoms - Reviewed the reports of her ultrasound and recent dominant nodule biopsies: benign - we'll continue to follow - will likely need a new ultrasound after 2 years

## 2015-10-22 NOTE — Telephone Encounter (Signed)
Pt advised.   Thanks,   -Kseniya Grunden  

## 2015-11-01 ENCOUNTER — Encounter: Payer: Self-pay | Admitting: Family Medicine

## 2015-11-01 ENCOUNTER — Ambulatory Visit (INDEPENDENT_AMBULATORY_CARE_PROVIDER_SITE_OTHER): Payer: BLUE CROSS/BLUE SHIELD | Admitting: Family Medicine

## 2015-11-01 VITALS — BP 120/78 | HR 88 | Temp 98.0°F | Resp 16 | Wt 228.0 lb

## 2015-11-01 DIAGNOSIS — E1142 Type 2 diabetes mellitus with diabetic polyneuropathy: Secondary | ICD-10-CM

## 2015-11-01 DIAGNOSIS — E1165 Type 2 diabetes mellitus with hyperglycemia: Secondary | ICD-10-CM

## 2015-11-01 DIAGNOSIS — Z23 Encounter for immunization: Secondary | ICD-10-CM | POA: Diagnosis not present

## 2015-11-01 DIAGNOSIS — M5137 Other intervertebral disc degeneration, lumbosacral region: Secondary | ICD-10-CM

## 2015-11-01 DIAGNOSIS — K219 Gastro-esophageal reflux disease without esophagitis: Secondary | ICD-10-CM | POA: Diagnosis not present

## 2015-11-01 DIAGNOSIS — IMO0002 Reserved for concepts with insufficient information to code with codable children: Secondary | ICD-10-CM

## 2015-11-01 DIAGNOSIS — I10 Essential (primary) hypertension: Secondary | ICD-10-CM

## 2015-11-01 NOTE — Progress Notes (Signed)
Patient ID: Gabriella Marsh, female   DOB: 24-Nov-1966, 49 y.o.   MRN: [id number]          Patient: Gabriella Marsh Female    DOB: 09-Aug-1966   49 y.o.   MRN: [id number] Visit Date: 11/01/2015  Today's Provider: Margarita Rana, MD   Chief Complaint  Patient presents with  . Peripheral Neuropathy  . Diabetes   Subjective:    Diabetes She presents for her follow-up diabetic visit. She has type 2 diabetes mellitus. Her disease course has been worsening. Hypoglycemia symptoms include dizziness (History of vertigo.). Pertinent negatives for hypoglycemia include no headaches, speech difficulty or tremors. Associated symptoms include foot paresthesias, polydipsia (Since starting Invokana), polyuria and weakness. Pertinent negatives for diabetes include no blurred vision, no fatigue, no foot ulcerations (Pt reports she did have a blister on the bottom of her foot over the summer which has healed. ), no polyphagia, no visual change and no weight loss. Symptoms are worsening. Her weight is increasing steadily. Her overall blood glucose range is 140-180 mg/dl. She does not see a podiatrist.Eye exam is current.  Gastroesophageal Reflux She complains of heartburn. She reports no abdominal pain, no choking, no coughing, no nausea or no wheezing. This is a new problem. The current episode started more than 1 month ago. The problem occurs constantly. The problem has been gradually worsening. The heartburn wakes her from sleep. Pertinent negatives include no fatigue or weight loss. She has tried an antacid for the symptoms. The treatment provided mild relief.  Neuropathy: She describes symptoms of burning, tingling, cramping and painful. Onset of symptoms started several years ago. Symptoms are currently of severe severity. Symptoms occur all day. Symptoms are symmetric and worse on the left side. She also describes autonomic symptoms of  foot and leg pain and also low back pain from DDD. Walks with quad cane.  Previous treatment has included gabapentin, nortriptyline and effexor, which has improved symptoms some but not completely. Has been unable to return to work secondary unable to complete her work tasks. Please see disability form for specifics, but in short she is unable to stay in any one position or focus secondary to her pain. She is unable to focus to complete the tasks of her job.        Allergies  Allergen Reactions  . Pregabalin Shortness Of Breath  . Celecoxib Other (See Comments)    Unknown   Previous Medications   ALBIGLUTIDE (TANZEUM) 30 MG PEN    Inject 30 mg into the skin once a week.   ALPRAZOLAM (XANAX) 0.5 MG TABLET    Take 0.5 mg by mouth 2 (two) times daily as needed for anxiety.   ATORVASTATIN (LIPITOR) 20 MG TABLET    Take 1 tablet (20 mg total) by mouth daily.   CANAGLIFLOZIN (INVOKANA) 100 MG TABS TABLET    Take 1 tablet (100 mg total) by mouth daily.   GABAPENTIN (NEURONTIN) 100 MG CAPSULE    Take 2 capsules (200 mg total) by mouth 3 (three) times daily. Along with 600 mg three times daily (TDD 2400 mg daily)   GABAPENTIN (NEURONTIN) 600 MG TABLET    Take 1 tablet (600 mg total) by mouth 3 (three) times daily.   GLIPIZIDE (GLUCOTROL) 10 MG TABLET    Take 5 mg by mouth 2 (two) times daily before a meal.   GLUCOSE BLOOD TEST STRIP    Check blood sugars twice daily. OneTouch Verio strips.   METFORMIN (GLUCOPHAGE-XR) 500 MG  24 HR TABLET    Take 2 tablets (1,000 mg total) by mouth 2 (two) times daily with a meal.   NORTRIPTYLINE (PAMELOR) 25 MG CAPSULE    Take 2 capsules (50 mg total) by mouth at bedtime.   OXYCODONE-ACETAMINOPHEN (PERCOCET/ROXICET) 5-325 MG TABLET    Take 1 tablet by mouth every 6 (six) hours as needed for severe pain.   VENLAFAXINE (EFFEXOR) 75 MG TABLET    Take 75 mg by mouth 2 (two) times daily.    Review of Systems  Constitutional: Negative for fever, chills, weight loss, diaphoresis, activity change, appetite change, fatigue and unexpected weight  change.  Eyes: Negative for blurred vision.  Respiratory: Negative for apnea, cough, choking, chest tightness, shortness of breath, wheezing and stridor.   Cardiovascular: Positive for leg swelling (Ankles swell occasionally). Negative for palpitations.  Gastrointestinal: Positive for heartburn. Negative for nausea and abdominal pain.  Endocrine: Positive for polydipsia (Since starting Invokana) and polyuria. Negative for polyphagia.  Musculoskeletal: Positive for myalgias, back pain, arthralgias, gait problem (Walks with a quad cane.) and neck pain. Negative for joint swelling and neck stiffness.  Neurological: Positive for dizziness (History of vertigo.), weakness and numbness. Negative for tremors, syncope, facial asymmetry, speech difficulty, light-headedness and headaches.    Social History  Substance Use Topics  . Smoking status: Former Research scientist (life sciences)  . Smokeless tobacco: Never Used  . Alcohol Use: No   Objective:   BP 120/78 mmHg  Pulse 88  Temp(Src) 98 F (36.7 C) (Oral)  Resp 16  Wt 228 lb (103.42 kg)  Physical Exam  Constitutional: She is oriented to person, place, and time. She appears well-developed and well-nourished.  Cardiovascular: Normal rate and regular rhythm.   Pulmonary/Chest: Effort normal and breath sounds normal.  Musculoskeletal:  Walking with cane.   Neurological: She is alert and oriented to person, place, and time.  Psychiatric: She has a normal mood and affect. Her behavior is normal. Judgment and thought content normal.  Vitals reviewed.     Assessment & Plan:      1. Essential hypertension Stable. Continue current medication and plan of care.   2. Gastroesophageal reflux disease without esophagitis Stable.   3. Uncontrolled type 2 diabetes mellitus with peripheral neuropathy ([place]) Will follow up with Endocrinologist and continue medication for neuropathy.   4. DDD (degenerative disc disease), lumbosacral Continue walking with a cane.  Continue  pain medication.  Filled out form for disability. Please see form.    Margarita Rana, MD  [facility]

## 2015-11-05 NOTE — Addendum Note (Signed)
Addended by: Ashley Royalty E on: 11/05/2015 08:37 AM   Modules accepted: Orders

## 2015-11-09 ENCOUNTER — Other Ambulatory Visit: Payer: Self-pay | Admitting: Family Medicine

## 2015-11-11 ENCOUNTER — Telehealth: Payer: Self-pay | Admitting: Family Medicine

## 2015-11-11 NOTE — Telephone Encounter (Signed)
Pt called asking if Gabriella Marsh would give her a call regarding her long term disability .  Call back is [phone]  Thanks, Con Memos

## 2015-11-11 NOTE — Telephone Encounter (Signed)
[employer] called and did not get recent information. Also needs office notes.

## 2015-11-19 ENCOUNTER — Other Ambulatory Visit: Payer: Self-pay | Admitting: Family Medicine

## 2015-11-19 DIAGNOSIS — F419 Anxiety disorder, unspecified: Secondary | ICD-10-CM

## 2015-11-19 DIAGNOSIS — E1340 Other specified diabetes mellitus with diabetic neuropathy, unspecified: Secondary | ICD-10-CM

## 2015-11-19 DIAGNOSIS — E1142 Type 2 diabetes mellitus with diabetic polyneuropathy: Secondary | ICD-10-CM

## 2015-11-19 DIAGNOSIS — I10 Essential (primary) hypertension: Secondary | ICD-10-CM

## 2015-11-19 DIAGNOSIS — E7800 Pure hypercholesterolemia, unspecified: Secondary | ICD-10-CM

## 2015-11-19 DIAGNOSIS — E042 Nontoxic multinodular goiter: Secondary | ICD-10-CM

## 2015-11-19 MED ORDER — METFORMIN HCL ER 500 MG PO TB24: 1000.0000 mg | ORAL_TABLET | Freq: Two times a day (BID) | ORAL | Status: DC

## 2015-11-19 MED ORDER — OXYCODONE-ACETAMINOPHEN 5-325 MG PO TABS: 1.0000 | ORAL_TABLET | Freq: Four times a day (QID) | ORAL | Status: DC | PRN

## 2015-11-19 MED ORDER — NORTRIPTYLINE HCL 25 MG PO CAPS: 50.0000 mg | ORAL_CAPSULE | Freq: Every day | ORAL | Status: DC

## 2015-11-19 MED ORDER — VENLAFAXINE HCL 75 MG PO TABS: 75.0000 mg | ORAL_TABLET | Freq: Two times a day (BID) | ORAL | Status: DC

## 2015-11-19 NOTE — Telephone Encounter (Signed)
Pt needs refills on her nortriptyline   (PAMELOR) 25 MG capsule metFORMIN (GLUCOPHAGE-XR) 500 MG 24 hr tablet venlafaxine (EFFEXOR) 75 MG tablet  Sent to [facility]  Also she needs written rx for heroxyCODONE-acetaminophen (PERCOCET/ROXICET) 5-325 MG tablet  Her Call back is [phone]  Thanks, Con Memos  Please disregard first message.Marland KitchenMarland KitchenMarland Kitchen

## 2015-11-19 NOTE — Telephone Encounter (Signed)
Pt called needing refills on her  nortriptilyn,  Generic effecro            metFORMIN (GLUCOPHAGE-XR) 500   Exp  Percoset

## 2015-11-19 NOTE — Telephone Encounter (Signed)
Prescription printed. Please notify patient it is ready for pick up. Thanks- Dr. Nieves Barberi.  

## 2015-11-26 ENCOUNTER — Telehealth: Payer: Self-pay | Admitting: Internal Medicine

## 2015-11-26 NOTE — Telephone Encounter (Signed)
Called by ENT office (Dr. Darien Ramus  assistant), about patient having a possible infected cervical lymph node vs parotiditis. She will start antibiotics, but she also needs steroids.  I advised  them to tell the patient to check her sugars after meals, and  If these are elevated, to double the dose of her glipizide from  5 mg twice a day 10 mg twice a day. If the sugars are persistently elevated afterwards,  Advised her to call our office, because she may need mealtime insulin at that point.

## 2015-12-11 ENCOUNTER — Ambulatory Visit (INDEPENDENT_AMBULATORY_CARE_PROVIDER_SITE_OTHER): Payer: BLUE CROSS/BLUE SHIELD | Admitting: Internal Medicine

## 2015-12-11 ENCOUNTER — Encounter: Payer: Self-pay | Admitting: Internal Medicine

## 2015-12-11 ENCOUNTER — Telehealth: Payer: Self-pay | Admitting: Family Medicine

## 2015-12-11 VITALS — BP 150/90 | HR 94 | Temp 98.0°F | Resp 12 | Wt 228.0 lb

## 2015-12-11 DIAGNOSIS — E1165 Type 2 diabetes mellitus with hyperglycemia: Secondary | ICD-10-CM | POA: Diagnosis not present

## 2015-12-11 DIAGNOSIS — E042 Nontoxic multinodular goiter: Secondary | ICD-10-CM

## 2015-12-11 DIAGNOSIS — IMO0002 Reserved for concepts with insufficient information to code with codable children: Secondary | ICD-10-CM

## 2015-12-11 DIAGNOSIS — E1142 Type 2 diabetes mellitus with diabetic polyneuropathy: Secondary | ICD-10-CM

## 2015-12-11 DIAGNOSIS — E7800 Pure hypercholesterolemia, unspecified: Secondary | ICD-10-CM

## 2015-12-11 MED ORDER — ALBIGLUTIDE 50 MG ~~LOC~~ PEN: PEN_INJECTOR | SUBCUTANEOUS | Status: DC

## 2015-12-11 MED ORDER — ATORVASTATIN CALCIUM 20 MG PO TABS: 20.0000 mg | ORAL_TABLET | Freq: Every day | ORAL | Status: DC

## 2015-12-11 NOTE — Telephone Encounter (Signed)
Pt stated she was returning Laura's call from 12/10/15. Pt stated that the insurance company needs more details about why pt can not work. Pt stated that she is about to go to an appointment and she should be finished in about an hour. Pt request a call back to find out what she needs to do to get the forms filled out. Thanks TNP

## 2015-12-11 NOTE — Patient Instructions (Signed)
Patient Instructions  Please continue: - Metformin XR 1000 mg 2x a day, with meals - Glipizide 5 mg 2x a day, before meals  Increase: - Tanzeum to 50 mg weekly   Restart: - Invokana 100 mg in am, before b'fast.  Please return in 1.5 months with your sugar log.   Please let me know if the sugars are consistently <80 or >200.

## 2015-12-11 NOTE — Progress Notes (Signed)
Patient ID: Gabriella Marsh, female   DOB: [birth year], 49 y.o.   MRN: [id number]  HPI: Gabriella Marsh is a 49 y.o.-year-old female, returning for follow-up for DM2, dx in 2003, non-insulin-dependent, uncontrolled, with complications (peripherally neuropathy) and multinodular goiter. Last visit 3 mo ago.  Ins: [employer].  She was dx'ed with BPPV since last visit >> Dix-Hallpike maneuver worked >> no dizziness.  She has an enlarged LN in upper neck >> got steroids for this >> followed by ENT.  She is looking for disability for peripheral neuropathy.   DM2: Last hemoglobin A1c was: Lab Results  Component Value Date   HGBA1C 9.2 10/22/2015   HGBA1C 9.7* 08/28/2015   HGBA1C 7.4* 06/05/2015  07/12/2015: HbA1c 7.2% 05/04/2015: HbA1c 7.8%  She had more dietary indiscretions, but tries to keep more active.   Pt is on a regimen of: - Metformin XR 1000 mg 2x a day, with meals - Glipizide 5 mg 2x a day - Tanzeum 30 mg weekly started 02/2015 - Invokana 100 mg daily - off for 1 mo (i nsurance)  Pt checks her sugars 2x a day and they are now in the 200s (ran out of Invokana for 1 mo): - am: 95-114 - rarely 160-210 >> 120-150 >> 105-130, 145 - 2h after b'fast: n/c >> 150-220, better in last week >> n/c - before lunch: n/c - 2h after lunch: n/c - before dinner: n/c - 2h after dinner: 150-275 >> 110-140 >> 200 - bedtime: n/c - nighttime: n/c No lows. Lowest sugar was 90s >> 110 >> 105; she has hypoglycemia awareness at 70.  Highest sugar was 275 >> 230 >> 200s.  Glucometer: One Touch  Pt's meals are: - Breakfast: cereals (honey nut) + milk (2%) - Lunch + dinner (2-3 pm): meat + starch + veggies - Snacks: popcorn low salt; fiber one bars, bananas and oranges, sugar free icecream  She saw nutrition in the past with her first husband who ended up dying from DM1.  - no CKD, last BUN/creatinine:  Lab Results  Component Value Date   BUN 9 06/28/2015   CREATININE 0.59 06/28/2015   - + HL.  Last set of lipids:   She is on Lipitor. - last eye exam was in 01/2015. No DR.  - + numbness and tingling in her feet. For her back pain, she is on gabapentin 600 mg 4 times a day.  She is also on nortriptyline, venlafaxine, alprazolam.  Pt has FH of DM in father - DM2.   Patient also has a history of nontoxic multinodular goiter, with 2 dominant nodules biopsied in 04/2015, with benign results.  She is out of work for back pain. She also has a history of hypertension and hyperlipidemia.  ROS: Constitutional: + weight gain, no fatigue, no subjective hyperthermia/hypothermia, + poor sleep Eyes: no blurry vision, no xerophthalmia ENT: no sore throat, + nodules palpated in throat, no dysphagia/odynophagia, no hoarseness Cardiovascular: no CP/SOB/palpitations/leg swelling Respiratory: no cough/SOB Gastrointestinal: no N/V/D/C, + acid reflux Musculoskeletal: + muscle/+ joint aches Skin: no rashes Neurological: no tremors/numbness/tingling/+ dizziness  I reviewed pt's medications, allergies, PMH, social hx, family hx, and changes were documented in the history of present illness. Otherwise, unchanged from my initial visit note.  Past Medical History  Diagnosis Date  . Hypertension   . Diabetes mellitus without complication ([place])   . Hyperlipidemia    Past Surgical History  Procedure Laterality Date  . Cesarean section    . Shoulder surgery Left   .  Abdominal hysterectomy      But still has cervix  . Tonsillectomy and adenoidectomy    . Uterine fibroid surgery    . Ovarian cyst surgery    . Cholecystectomy     History   Social History  . Marital Status: Divorced    Spouse Name: N/A  . Number of Children: 1  . Years of Education: H/S   Occupational History  . Customer service [employer]    Now out of work for back pain   Social History Main Topics  . Smoking status: Former Research scientist (life sciences), quit in 2012  . Smokeless tobacco: Never Used  . Alcohol Use: No  . Drug Use: No    Current Outpatient Prescriptions on File Prior to Visit  Medication Sig Dispense Refill  . Albiglutide (TANZEUM) 30 MG PEN Inject 30 mg into the skin once a week. 4 each 5  . ALPRAZolam (XANAX) 0.5 MG tablet Take 0.5 mg by mouth 2 (two) times daily as needed for anxiety.    Marland Kitchen atorvastatin (LIPITOR) 20 MG tablet Take 1 tablet (20 mg total) by mouth daily. 90 tablet 3  . gabapentin (NEURONTIN) 100 MG capsule Take 2 capsules (200 mg total) by mouth 3 (three) times daily. Along with 600 mg three times daily (TDD 2400 mg daily) (Patient taking differently: Take 100 mg by mouth 3 (three) times daily. Along with 600 mg three times daily (TDD 2400 mg daily)) 180 capsule 0  . gabapentin (NEURONTIN) 600 MG tablet Take 1 tablet by mouth 3  times daily 270 tablet 1  . glipiZIDE (GLUCOTROL) 10 MG tablet Take 5 mg by mouth 2 (two) times daily before a meal.    . glucose blood test strip Check blood sugars twice daily. OneTouch Verio strips. 200 each 5  . metFORMIN (GLUCOPHAGE-XR) 500 MG 24 hr tablet Take 2 tablets (1,000 mg total) by mouth 2 (two) times daily with a meal. 360 tablet 1  . nortriptyline (PAMELOR) 25 MG capsule Take 2 capsules (50 mg total) by mouth at bedtime. 180 capsule 1  . oxyCODONE-acetaminophen (PERCOCET/ROXICET) 5-325 MG tablet Take 1 tablet by mouth every 6 (six) hours as needed for severe pain. 60 tablet 0  . venlafaxine (EFFEXOR) 75 MG tablet Take 1 tablet (75 mg total) by mouth 2 (two) times daily. 180 tablet 1  . canagliflozin (INVOKANA) 100 MG TABS tablet Take 1 tablet (100 mg total) by mouth daily. (Patient not taking: Reported on 12/11/2015) 30 tablet 2   No current facility-administered medications on file prior to visit.   Allergies  Allergen Reactions  . Pregabalin Shortness Of Breath  . Celecoxib Other (See Comments)    Unknown   Family History  Problem Relation Age of Onset  . Osteoporosis Mother   . Irritable bowel syndrome Mother   . Diabetes Father   . Heart  disease Father   . Hypertension Father   . Hyperlipidemia Father   . Congestive Heart Failure Father   . Osteoporosis Maternal Grandmother   . Healthy Brother    PE: BP 150/90 mmHg  Pulse 94  Temp(Src) 98 F (36.7 C) (Oral)  Resp 12  Wt 228 lb (103.42 kg)  SpO2 98%Body mass index is 32.71 kg/(m^2). Wt Readings from Last 3 Encounters:  12/11/15 228 lb (103.42 kg)  11/01/15 228 lb (103.42 kg)  10/22/15 227 lb (102.967 kg)   Constitutional: overweight, in NAD Eyes: PERRLA, EOMI, no exophthalmos ENT: moist mucous membranes, no thyromegaly, no cervical lymphadenopathy Cardiovascular: tachycardiaRR, No  MRG, no LE edema Respiratory: CTA B Gastrointestinal: abdomen soft, NT, ND, BS+ Musculoskeletal: no deformities, strength intact in all 4 Skin: moist, warm, no rashes except left heel dyshidrotic eczema Neurological: no tremor with outstretched hands, DTR normal in all 4  ASSESSMENT: 1. DM2, non-insulin-dependent, uncontrolled, with complications - PN  2.  Multinodular goiter  PLAN:  1. Patient with long-standing, uncontrolled diabetes, on oral antidiabetic regimen +  Injectable GLP 1 receptor agonist, with greatly improved control after adding Tanzeum, but at last visit Hba1c still high >> again discussed diet control and added Invokana. Her sugars greatly improved on Invokana, but was off x1 mo (sugars in the 200s) as her insurance did not cover the entire cost). She will qualify again for it after the 1st of the year. As sugars high, will increase Tanzeum to target dose for now.  - I suggested to:  Patient Instructions  Please continue: - Metformin XR 1000 mg 2x a day, with meals - Glipizide 5 mg 2x a day, before meals  Increase: - Tanzeum to 50 mg weekly   Restart: - Invokana 100 mg in am, before b'fast.  Please return in 1.5 months with your sugar log.   Please let me know if the sugars are consistently <80 or >200.  - check sugars at different times of the day -  check 2 times a day, rotating checks - advised for yearly eye exams >>  She is up-to-date - Return to clinic in 1.5 mo with sugar log>> needs BMP then  2. Nontoxic multinodular goiter - no neck compression symptoms - Reviewed the reports of her ultrasound and recent dominant nodule biopsies: benign - we'll continue to follow - will likely need a new ultrasound after 2 years from previous

## 2015-12-17 ENCOUNTER — Telehealth: Payer: Self-pay | Admitting: Family Medicine

## 2015-12-17 NOTE — Telephone Encounter (Signed)
Patient is requesting to talk to Mickel Baas. Patient advised that Mickel Baas is out of the office till next week. Patient reports that she is will wait till next week to talk to Mickel Baas. Dr. Venia Minks aware. sd

## 2015-12-17 NOTE — Telephone Encounter (Signed)
Pt would like for Mickel Baas to return her call, pt states Mickel Baas knows about her disability forms and would like to speak with her about the forms. Thanks CC

## 2015-12-25 ENCOUNTER — Telehealth: Payer: Self-pay | Admitting: Family Medicine

## 2015-12-25 DIAGNOSIS — E1340 Other specified diabetes mellitus with diabetic neuropathy, unspecified: Secondary | ICD-10-CM

## 2015-12-25 MED ORDER — OXYCODONE-ACETAMINOPHEN 5-325 MG PO TABS: 1.0000 | ORAL_TABLET | Freq: Four times a day (QID) | ORAL | Status: DC | PRN

## 2015-12-25 NOTE — Telephone Encounter (Signed)
Printed please notify patient. Thanks.   

## 2015-12-25 NOTE — Addendum Note (Signed)
Addended by: Jerrell Belfast on: 12/25/2015 09:50 AM   Modules accepted: Orders

## 2016-01-07 ENCOUNTER — Encounter: Payer: Self-pay | Admitting: Family Medicine

## 2016-01-27 ENCOUNTER — Other Ambulatory Visit: Payer: Self-pay | Admitting: Family Medicine

## 2016-01-27 DIAGNOSIS — E1340 Other specified diabetes mellitus with diabetic neuropathy, unspecified: Secondary | ICD-10-CM

## 2016-01-27 MED ORDER — OXYCODONE-ACETAMINOPHEN 5-325 MG PO TABS: 1.0000 | ORAL_TABLET | Freq: Four times a day (QID) | ORAL | Status: DC | PRN

## 2016-01-27 NOTE — Telephone Encounter (Signed)
Pt contacted office for refill request on the following medications: oxyCODONE-acetaminophen (PERCOCET/ROXICET) 5-325 MG tablet. Pt stated she will be out in the next 2 days. Last written on 12/25/15. Last OV 11/01/15. Thanks TNP

## 2016-01-27 NOTE — Telephone Encounter (Signed)
Prescription printed. Please notify patient it is ready for pick up. Thanks- Dr. Zaccheaus Storlie.  

## 2016-01-27 NOTE — Telephone Encounter (Signed)
Please review. Thanks!  

## 2016-02-03 ENCOUNTER — Ambulatory Visit (INDEPENDENT_AMBULATORY_CARE_PROVIDER_SITE_OTHER): Payer: BLUE CROSS/BLUE SHIELD | Admitting: Family Medicine

## 2016-02-03 ENCOUNTER — Encounter: Payer: Self-pay | Admitting: Family Medicine

## 2016-02-03 VITALS — BP 116/72 | HR 76 | Temp 97.6°F | Resp 16 | Wt 227.0 lb

## 2016-02-03 DIAGNOSIS — E1340 Other specified diabetes mellitus with diabetic neuropathy, unspecified: Secondary | ICD-10-CM

## 2016-02-03 DIAGNOSIS — H8110 Benign paroxysmal vertigo, unspecified ear: Secondary | ICD-10-CM | POA: Insufficient documentation

## 2016-02-03 DIAGNOSIS — I10 Essential (primary) hypertension: Secondary | ICD-10-CM | POA: Diagnosis not present

## 2016-02-03 DIAGNOSIS — F419 Anxiety disorder, unspecified: Secondary | ICD-10-CM

## 2016-02-03 NOTE — Progress Notes (Signed)
Subjective:    Patient ID: Gabriella Marsh, female    DOB: [birth year], 50 y.o.   MRN: [id number]  HPI  Neuropathy: She describes symptoms of numbness and pain. Onset of symptoms was 8-9 years ago. Symptoms are currently of severe severity. Symptoms occur all day. Symptoms are worsening. Current treatment includes gabapentin, Percocet, Nortriptyline, and Venlafaxine which has improved symptoms.  Anxiety: Worsening problem since losing job and being denied long-term disability. Pt has run out of Xanax prescription. Currently not taking it.  Recently increased her Effexor for the neuropathy. Has helped some.   Was denied long term disability with her work, but is still working on long term disability, is going appeal it. They did not get records from Dr. Manuella Ghazi. Also, going to disability specialist for physical exam and also psychologist. Hoping to get this straight as still unable to work secondary to continue neuropathy. Worsening.      Review of Systems  Constitutional: Positive for fatigue. Negative for fever, chills, diaphoresis, appetite change and unexpected weight change.  Respiratory: Negative for cough and shortness of breath.   Cardiovascular: Negative for chest pain and palpitations.  Neurological: Positive for numbness (still can not sit or stand for long periods. ).       Neuropathy present  Psychiatric/Behavioral: The patient is nervous/anxious (decresase mood.  ).    BP 116/72 mmHg  Pulse 76  Temp(Src) 97.6 F (36.4 C) (Oral)  Resp 16  Wt 227 lb (102.967 kg)   Patient Active Problem List   Diagnosis Date Noted  . Uncontrolled type 2 diabetes mellitus with peripheral neuropathy ([place]) 07/22/2015  . Common migraine with intractable migraine 06/18/2015  . Anxiety 05/04/2015  . Adaptation reaction 05/03/2015  . DDD (degenerative disc disease), lumbosacral 05/03/2015  . Hypercholesteremia 05/03/2015  . Insomnia due to medical condition 05/03/2015  . Adiposity 05/03/2015    . Disorder of peripheral nervous system ([place]) 05/03/2015  . Snores 05/03/2015  . Diabetic neuropathy ([place]) 03/06/2015  . Essential hypertension 03/06/2015  . Elevated liver function tests 03/06/2015  . Obesity 03/06/2015  . GERD (gastroesophageal reflux disease) 03/06/2015  . Multinodular goiter 03/06/2015  . RLS (restless legs syndrome) 03/06/2015  . Leg cramps 03/06/2015   Past Medical History  Diagnosis Date  . Hypertension   . Diabetes mellitus without complication ([place])   . Hyperlipidemia    Current Outpatient Prescriptions on File Prior to Visit  Medication Sig  . Albiglutide (TANZEUM) 50 MG PEN Inject under skin 50 mg weekly  . atorvastatin (LIPITOR) 20 MG tablet Take 1 tablet (20 mg total) by mouth daily.  Marland Kitchen gabapentin (NEURONTIN) 100 MG capsule Take 2 capsules (200 mg total) by mouth 3 (three) times daily. Along with 600 mg three times daily (TDD 2400 mg daily) (Patient taking differently: Take 100 mg by mouth 3 (three) times daily. Along with 600 mg three times daily (TDD 2400 mg daily))  . gabapentin (NEURONTIN) 600 MG tablet Take 1 tablet by mouth 3  times daily  . glipiZIDE (GLUCOTROL) 10 MG tablet Take 5 mg by mouth 2 (two) times daily before a meal.  . glucose blood test strip Check blood sugars twice daily. OneTouch Verio strips.  . metFORMIN (GLUCOPHAGE-XR) 500 MG 24 hr tablet Take 2 tablets (1,000 mg total) by mouth 2 (two) times daily with a meal.  . nortriptyline (PAMELOR) 25 MG capsule Take 2 capsules (50 mg total) by mouth at bedtime.  Marland Kitchen oxyCODONE-acetaminophen (PERCOCET/ROXICET) 5-325 MG tablet Take 1 tablet by  mouth every 6 (six) hours as needed for severe pain.  Marland Kitchen venlafaxine (EFFEXOR) 75 MG tablet Take 1 tablet (75 mg total) by mouth 2 (two) times daily. (Patient taking differently: Take 75 mg by mouth 2 (two) times daily. 75 mg in the am, 150 mg qhs)  . ALPRAZolam (XANAX) 0.5 MG tablet Take 0.5 mg by mouth 2 (two) times daily as needed for anxiety. Reported  on 02/03/2016  . canagliflozin (INVOKANA) 100 MG TABS tablet Take 1 tablet (100 mg total) by mouth daily. (Patient not taking: Reported on 12/11/2015)   No current facility-administered medications on file prior to visit.   Allergies  Allergen Reactions  . Pregabalin Shortness Of Breath   Past Surgical History  Procedure Laterality Date  . Cesarean section    . Shoulder surgery Left   . Abdominal hysterectomy      But still has cervix  . Tonsillectomy and adenoidectomy    . Uterine fibroid surgery    . Ovarian cyst surgery    . Cholecystectomy     Social History   Social History  . Marital Status: Divorced    Spouse Name: N/A  . Number of Children: 1  . Years of Education: H/S   Occupational History  .  [employer]    Full-Time   Social History Main Topics  . Smoking status: Former Research scientist (life sciences)  . Smokeless tobacco: Never Used  . Alcohol Use: No  . Drug Use: No  . Sexual Activity: Not on file   Other Topics Concern  . Not on file   Social History Narrative   Family History  Problem Relation Age of Onset  . Osteoporosis Mother   . Irritable bowel syndrome Mother   . Diabetes Father   . Heart disease Father   . Hypertension Father   . Hyperlipidemia Father   . Congestive Heart Failure Father   . Osteoporosis Maternal Grandmother   . Healthy Brother        Objective:   Physical Exam  Constitutional: She is oriented to person, place, and time. She appears well-developed and well-nourished.  Cardiovascular: Normal rate and regular rhythm.   Pulmonary/Chest: Effort normal and breath sounds normal.  Neurological: She is alert and oriented to person, place, and time.  Psychiatric: Judgment and thought content normal.  Flat affect.    BP 116/72 mmHg  Pulse 76  Temp(Src) 97.6 F (36.4 C) (Oral)  Resp 16  Wt 227 lb (102.967 kg)     Assessment & Plan:   1. Essential hypertension Condition is stable. Please continue current medication and  plan of care as noted.     2. Diabetic neuropathy associated with other specified diabetes mellitus ([place]) Poor control.  Neuropathy worsening.  Still unable to work.  Working to obtain disability.   Has several appointments with specialist set up. Initial paper work did not have information from her neurologist.    3. Anxiety Worsening secondary to physical and financial stressors.    4. Benign positional vertigo, unspecified laterality Improved after ENT maneuvers.   Patient was seen and examined by Jerrell Belfast, MD, and note scribed by Renaldo Fiddler, CMA. I have reviewed the document for accuracy and completeness and I agree with above. Jerrell Belfast, MD   Margarita Rana, MD

## 2016-02-04 ENCOUNTER — Encounter: Payer: Self-pay | Admitting: Internal Medicine

## 2016-02-04 ENCOUNTER — Ambulatory Visit (INDEPENDENT_AMBULATORY_CARE_PROVIDER_SITE_OTHER): Payer: BLUE CROSS/BLUE SHIELD | Admitting: Internal Medicine

## 2016-02-04 ENCOUNTER — Other Ambulatory Visit (INDEPENDENT_AMBULATORY_CARE_PROVIDER_SITE_OTHER): Payer: BLUE CROSS/BLUE SHIELD | Admitting: *Deleted

## 2016-02-04 VITALS — BP 114/66 | HR 92 | Temp 97.8°F | Resp 12 | Wt 226.0 lb

## 2016-02-04 DIAGNOSIS — E1142 Type 2 diabetes mellitus with diabetic polyneuropathy: Secondary | ICD-10-CM

## 2016-02-04 DIAGNOSIS — IMO0002 Reserved for concepts with insufficient information to code with codable children: Secondary | ICD-10-CM

## 2016-02-04 DIAGNOSIS — E1165 Type 2 diabetes mellitus with hyperglycemia: Secondary | ICD-10-CM

## 2016-02-04 DIAGNOSIS — E042 Nontoxic multinodular goiter: Secondary | ICD-10-CM | POA: Diagnosis not present

## 2016-02-04 LAB — POCT GLYCOSYLATED HEMOGLOBIN (HGB A1C): Hemoglobin A1C: 8.8

## 2016-02-04 MED ORDER — EMPAGLIFLOZIN 25 MG PO TABS
25.0000 mg | ORAL_TABLET | Freq: Every day | ORAL | Status: DC
Start: 2016-02-04 — End: 2016-07-09

## 2016-02-04 NOTE — Patient Instructions (Signed)
Please come tomorrow am so that Vikki can show you how to use the Tanzeum pen.  Please continue: - Metformin XR 1000 mg 2x a day, with meals - Glipizide 5 mg 2x a day - Tanzeum 50 mg weekly   Start: - Jardiance 25 mg daily in am  Please return in 3 months with your sugar log.

## 2016-02-04 NOTE — Progress Notes (Signed)
Patient ID: Gabriella Marsh, female   DOB: [birth year], 50 y.o.   MRN: [id number]  HPI: Gabriella Marsh is a 50 y.o.-year-old female-year-old female, returning for follow-up for DM2, dx in 2003, non-insulin-dependent, uncontrolled, with complications (peripherally neuropathy) and multinodular goiter. Last visit 2 mo ago.  Ins: [employer].  DM2: Last hemoglobin A1c was: Lab Results  Component Value Date   HGBA1C 9.2 10/22/2015   HGBA1C 9.7* 08/28/2015   HGBA1C 7.4* 06/05/2015  07/12/2015: HbA1c 7.2% 05/04/2015: HbA1c 7.8%  She had more dietary indiscretions, but tries to keep more active.   Pt is on a regimen of: - Metformin XR 1000 mg 2x a day, with meals - Glipizide 5 mg 2x a day - Tanzeum 50 mg weekly started 02/2015 - having pbs injecting it She stopped Invokana 100 mg daily - off for 1 mo (insurance) - 200$!  Pt checks her sugars 2x a day - no log, no meter: - am: 95-114 - rarely 160-210 >> 120-150 >> 105-130, 145 >> 110-118 (on Invokana), now: 200-250 - 2h after b'fast: n/c >> 150-220, better in last week >> n/c - before lunch: n/c - 2h after lunch: n/c - before dinner: n/c >> 170-180 - 2h after dinner: 150-275 >> 110-140 >> 200 >> 200s - bedtime: n/c - nighttime: n/c No lows. Lowest sugar was 90s >> 110 >> 105 >> 100s; she has hypoglycemia awareness at 70.  Highest sugar was 275 >> 230 >> 200s >> 250.  Glucometer: One Touch  Pt's meals are: - Breakfast: cereals (honey nut) + milk (2%) - Lunch + dinner (2-3 pm): meat + starch + veggies - Snacks: popcorn low salt; fiber one bars, bananas and oranges, sugar free icecream  She saw nutrition in the past with her first husband who ended up dying from DM1.  - no CKD, last BUN/creatinine:  Lab Results  Component Value Date   BUN 9 06/28/2015   CREATININE 0.59 06/28/2015   - + HL. Last set of lipids:   She is on Lipitor. - last eye exam was in 01/2015. No DR.  - + numbness and tingling in her feet. For her back pain, she is on gabapentin  600 mg 4 times a day.  She is also on nortriptyline, venlafaxine, alprazolam.  Patient also has a history of nontoxic multinodular goiter, with 2 dominant nodules biopsied in 04/2015, with benign results.  She is out of work for back pain. She also has a history of hypertension and hyperlipidemia.  She was dx'ed with BPPV 11/2015 >> Dix-Hallpike maneuver worked >> no dizziness.  She has an enlarged LN in upper neck >> got steroids for this >> followed by ENT.  She is looking for disability for peripheral neuropathy. She has an appt tomorrow with a psychiatrist (part of the disability request process).  ROS: Constitutional: no weight gain, + fatigue, no subjective hyperthermia/hypothermia Eyes: no blurry vision, no xerophthalmia ENT: no sore throat, no nodules palpated in throat, no dysphagia/odynophagia, no hoarseness Cardiovascular: no CP/SOB/palpitations/+ leg swelling Respiratory: no cough/SOB Gastrointestinal: no N/V/D/C, + acid reflux Musculoskeletal: + muscle/+ joint aches Skin: no rashes Neurological: no tremors/numbness/tingling/dizziness  I reviewed pt's medications, allergies, PMH, social hx, family hx, and changes were documented in the history of present illness. Otherwise, unchanged from my initial visit note.  Past Medical History  Diagnosis Date  . Hypertension   . Diabetes mellitus without complication ([place])   . Hyperlipidemia    Past Surgical History  Procedure Laterality Date  . Cesarean  section    . Shoulder surgery Left   . Abdominal hysterectomy      But still has cervix  . Tonsillectomy and adenoidectomy    . Uterine fibroid surgery    . Ovarian cyst surgery    . Cholecystectomy     History   Social History  . Marital Status: Divorced    Spouse Name: N/A  . Number of Children: 1  . Years of Education: H/S   Occupational History  . Customer service [employer] [place]    Now out of work for back pain   Social History Main Topics  . Smoking status:  Former Research scientist (life sciences), quit in 2012  . Smokeless tobacco: Never Used  . Alcohol Use: No  . Drug Use: No   Current Outpatient Prescriptions on File Prior to Visit  Medication Sig Dispense Refill  . Albiglutide (TANZEUM) 50 MG PEN Inject under skin 50 mg weekly 4 each 2  . ALPRAZolam (XANAX) 0.5 MG tablet Take 0.5 mg by mouth 2 (two) times daily as needed for anxiety. Reported on 02/03/2016    . atorvastatin (LIPITOR) 20 MG tablet Take 1 tablet (20 mg total) by mouth daily. 90 tablet 3  . canagliflozin (INVOKANA) 100 MG TABS tablet Take 1 tablet (100 mg total) by mouth daily. (Patient not taking: Reported on 12/11/2015) 30 tablet 2  . gabapentin (NEURONTIN) 100 MG capsule Take 2 capsules (200 mg total) by mouth 3 (three) times daily. Along with 600 mg three times daily (TDD 2400 mg daily) (Patient taking differently: Take 100 mg by mouth 3 (three) times daily. Along with 600 mg three times daily (TDD 2400 mg daily)) 180 capsule 0  . gabapentin (NEURONTIN) 600 MG tablet Take 1 tablet by mouth 3  times daily 270 tablet 1  . glipiZIDE (GLUCOTROL) 10 MG tablet Take 5 mg by mouth 2 (two) times daily before a meal.    . glucose blood test strip Check blood sugars twice daily. OneTouch Verio strips. 200 each 5  . metFORMIN (GLUCOPHAGE-XR) 500 MG 24 hr tablet Take 2 tablets (1,000 mg total) by mouth 2 (two) times daily with a meal. 360 tablet 1  . nortriptyline (PAMELOR) 25 MG capsule Take 2 capsules (50 mg total) by mouth at bedtime. 180 capsule 1  . oxyCODONE-acetaminophen (PERCOCET/ROXICET) 5-325 MG tablet Take 1 tablet by mouth every 6 (six) hours as needed for severe pain. 60 tablet 0  . venlafaxine (EFFEXOR) 75 MG tablet Take 1 tablet (75 mg total) by mouth 2 (two) times daily. (Patient taking differently: Take 75 mg by mouth 2 (two) times daily. 75 mg in the am, 150 mg qhs) 180 tablet 1   No current facility-administered medications on file prior to visit.   Allergies  Allergen Reactions  . Pregabalin  Shortness Of Breath   Family History  Problem Relation Age of Onset  . Osteoporosis Mother   . Irritable bowel syndrome Mother   . Diabetes Father   . Heart disease Father   . Hypertension Father   . Hyperlipidemia Father   . Congestive Heart Failure Father   . Osteoporosis Maternal Grandmother   . Healthy Brother    PE: BP 114/66 mmHg  Pulse 92  Temp(Src) 97.8 F (36.6 C) (Oral)  Resp 12  Wt 226 lb (102.513 kg)  SpO2 95%Body mass index is 32.43 kg/(m^2). Wt Readings from Last 3 Encounters:  02/04/16 226 lb (102.513 kg)  02/03/16 227 lb (102.967 kg)  12/11/15 228 lb (103.42 kg)  Constitutional: overweight, in NAD Eyes: PERRLA, EOMI, no exophthalmos ENT: moist mucous membranes, no thyromegaly, no cervical lymphadenopathy Cardiovascular: tachycardiaRR, No MRG, no LE edema Respiratory: CTA B Gastrointestinal: abdomen soft, NT, ND, BS+ Musculoskeletal: no deformities, strength intact in all 4 Skin: moist, warm, no rashes except left heel dyshidrotic eczema Neurological: no tremor with outstretched hands, DTR normal in all 4  ASSESSMENT: 1. DM2, non-insulin-dependent, uncontrolled, with complications - PN  2.  Multinodular goiter  PLAN:  1. Patient with long-standing, uncontrolled diabetes, on oral antidiabetic regimen +  Injectable GLP 1 receptor agonist, with greatly improved control after adding Invokana, but she had to stop it b/c price! Will try to switch to Jardiance, but if this is not covered, either, than will likely need basal insulin. Pt agrees with this.  - she will come tomorrow to be shown how to inject Tanzeum by the Diabetic educator - I suggested to:  Patient Instructions  Please come tomorrow am so that Paisli can show you how to use the Tanzeum pen.  Please continue: - Metformin XR 1000 mg 2x a day, with meals - Glipizide 5 mg 2x a day - Tanzeum 50 mg weekly   Start: - Jardiance 25 mg daily in am  Please return in 3 months with your sugar log.     - check sugars at different times of the day - check 2 times a day, rotating checks - advised for yearly eye exams >>  She is up-to-date - HbA1c today >> 8.8 % (improved, but still high) - Return to clinic in 3 mo with sugar log  2. Nontoxic multinodular goiter - no neck compression symptoms - Reviewed the reports of her ultrasound and recent dominant nodule biopsies: benign - we'll continue to follow - will likely need a new ultrasound after 2 years from previous

## 2016-02-05 ENCOUNTER — Telehealth: Payer: Self-pay | Admitting: Nutrition

## 2016-02-06 ENCOUNTER — Encounter: Payer: Self-pay | Admitting: Family Medicine

## 2016-02-06 ENCOUNTER — Telehealth: Payer: Self-pay | Admitting: Internal Medicine

## 2016-02-06 NOTE — Telephone Encounter (Signed)
Called pt and lvm advising her to call her ins co to see if they will cover Invokana or [place] .... And then let us know.

## 2016-02-06 NOTE — Telephone Encounter (Signed)
Pt called and said that the medication she looked into is not going to work for her financially and would like to be advised on the next steps.

## 2016-02-06 NOTE — Telephone Encounter (Signed)
Please have her check whether Invokana or [place] are covered.

## 2016-02-06 NOTE — Telephone Encounter (Signed)
Please read message below and advise.  

## 2016-02-09 ENCOUNTER — Encounter: Payer: Self-pay | Admitting: Internal Medicine

## 2016-02-09 ENCOUNTER — Encounter: Payer: Self-pay | Admitting: Family Medicine

## 2016-02-10 ENCOUNTER — Other Ambulatory Visit: Payer: Self-pay | Admitting: Internal Medicine

## 2016-02-10 ENCOUNTER — Encounter: Payer: Self-pay | Admitting: Internal Medicine

## 2016-02-10 DIAGNOSIS — E1165 Type 2 diabetes mellitus with hyperglycemia: Principal | ICD-10-CM

## 2016-02-10 DIAGNOSIS — E1142 Type 2 diabetes mellitus with diabetic polyneuropathy: Secondary | ICD-10-CM

## 2016-02-10 DIAGNOSIS — IMO0002 Reserved for concepts with insufficient information to code with codable children: Secondary | ICD-10-CM

## 2016-02-10 MED ORDER — INSULIN GLARGINE 100 UNIT/ML SOLOSTAR PEN: 15.0000 [IU] | PEN_INJECTOR | Freq: Every day | SUBCUTANEOUS | Status: DC

## 2016-02-10 MED ORDER — INSULIN PEN NEEDLE 32G X 4 MM MISC: Status: DC

## 2016-02-11 ENCOUNTER — Other Ambulatory Visit: Payer: Self-pay | Admitting: Family Medicine

## 2016-02-11 MED ORDER — GABAPENTIN 100 MG PO CAPS: 200.0000 mg | ORAL_CAPSULE | Freq: Three times a day (TID) | ORAL | Status: DC

## 2016-02-11 MED ORDER — GABAPENTIN 600 MG PO TABS: 600.0000 mg | ORAL_TABLET | Freq: Three times a day (TID) | ORAL | Status: DC

## 2016-02-26 ENCOUNTER — Telehealth: Payer: Self-pay | Admitting: Family Medicine

## 2016-02-26 ENCOUNTER — Other Ambulatory Visit: Payer: Self-pay | Admitting: Family Medicine

## 2016-02-26 DIAGNOSIS — E1340 Other specified diabetes mellitus with diabetic neuropathy, unspecified: Secondary | ICD-10-CM

## 2016-02-26 MED ORDER — OXYCODONE-ACETAMINOPHEN 5-325 MG PO TABS: 1.0000 | ORAL_TABLET | Freq: Four times a day (QID) | ORAL | Status: DC | PRN

## 2016-02-26 NOTE — Telephone Encounter (Signed)
Printed.  Please notify patient. Thanks.  

## 2016-03-03 NOTE — Telephone Encounter (Signed)
Opened in error

## 2016-03-12 ENCOUNTER — Other Ambulatory Visit: Payer: Self-pay | Admitting: Internal Medicine

## 2016-03-30 ENCOUNTER — Telehealth: Payer: Self-pay | Admitting: Family Medicine

## 2016-03-30 DIAGNOSIS — E1340 Other specified diabetes mellitus with diabetic neuropathy, unspecified: Secondary | ICD-10-CM

## 2016-03-30 MED ORDER — OXYCODONE-ACETAMINOPHEN 5-325 MG PO TABS: 1.0000 | ORAL_TABLET | Freq: Four times a day (QID) | ORAL | Status: DC | PRN

## 2016-03-30 NOTE — Telephone Encounter (Signed)
Pt contacted office for refill request on the following medications:  oxyCODONE-acetaminophen (PERCOCET/ROXICET) 5-325 MG tablet.  [id number]

## 2016-03-30 NOTE — Telephone Encounter (Signed)
Prescription printed. Please notify patient it is ready for pick up. Thanks- Dr. Marlon Suleiman.  

## 2016-03-30 NOTE — Telephone Encounter (Signed)
LOV 02/03/16 and last RX given on 02/26/16-aa

## 2016-04-07 DIAGNOSIS — G43719 Chronic migraine without aura, intractable, without status migrainosus: Secondary | ICD-10-CM | POA: Diagnosis not present

## 2016-05-04 ENCOUNTER — Other Ambulatory Visit: Payer: Self-pay

## 2016-05-04 DIAGNOSIS — E1340 Other specified diabetes mellitus with diabetic neuropathy, unspecified: Secondary | ICD-10-CM

## 2016-05-04 MED ORDER — OXYCODONE-ACETAMINOPHEN 5-325 MG PO TABS: 1.0000 | ORAL_TABLET | Freq: Four times a day (QID) | ORAL | Status: DC | PRN

## 2016-05-04 NOTE — Telephone Encounter (Signed)
Patient is requesting refill on medication. Last filled on 03/30/2016 # [id number]. Last OV was 02/03/2016.

## 2016-05-04 NOTE — Telephone Encounter (Signed)
Prescription printed. Please notify patient it is ready for pick up. Thanks- Dr. Brody Bonneau.  

## 2016-05-06 NOTE — Telephone Encounter (Signed)
Patient advised RX is at the front desk for pickup.  

## 2016-05-31 ENCOUNTER — Other Ambulatory Visit: Payer: Self-pay | Admitting: Family Medicine

## 2016-05-31 DIAGNOSIS — G64 Other disorders of peripheral nervous system: Secondary | ICD-10-CM

## 2016-05-31 DIAGNOSIS — G2581 Restless legs syndrome: Secondary | ICD-10-CM

## 2016-06-08 ENCOUNTER — Other Ambulatory Visit: Payer: Self-pay | Admitting: Family Medicine

## 2016-06-08 DIAGNOSIS — E1340 Other specified diabetes mellitus with diabetic neuropathy, unspecified: Secondary | ICD-10-CM

## 2016-06-08 MED ORDER — OXYCODONE-ACETAMINOPHEN 5-325 MG PO TABS: 1.0000 | ORAL_TABLET | Freq: Four times a day (QID) | ORAL | Status: DC | PRN

## 2016-06-08 NOTE — Telephone Encounter (Signed)
Printed.  Thanks.  

## 2016-06-08 NOTE — Telephone Encounter (Signed)
Pt needing refill on oxyCODONE-acetaminophen (PERCOCET/ROXICET) 5-325 MG tablet

## 2016-06-10 ENCOUNTER — Other Ambulatory Visit: Payer: Self-pay | Admitting: Family Medicine

## 2016-06-10 DIAGNOSIS — IMO0002 Reserved for concepts with insufficient information to code with codable children: Secondary | ICD-10-CM

## 2016-06-10 DIAGNOSIS — E1165 Type 2 diabetes mellitus with hyperglycemia: Principal | ICD-10-CM

## 2016-06-10 DIAGNOSIS — E1142 Type 2 diabetes mellitus with diabetic polyneuropathy: Secondary | ICD-10-CM

## 2016-07-01 ENCOUNTER — Telehealth: Payer: Self-pay | Admitting: Family Medicine

## 2016-07-01 NOTE — Telephone Encounter (Signed)
Pt was taken out of work on 04/17/15 by Dr.Maloney.  She has applied for disability and has a hearing coming up.  She states she is needing help with her mortgage until she get established on disablility and they are requiring a letter signed by a physician stating the date she was taken out of work, why she was taken out of work and the letter needs to be on letter head signed by MD.

## 2016-07-03 DIAGNOSIS — E1142 Type 2 diabetes mellitus with diabetic polyneuropathy: Secondary | ICD-10-CM | POA: Diagnosis not present

## 2016-07-03 DIAGNOSIS — G43719 Chronic migraine without aura, intractable, without status migrainosus: Secondary | ICD-10-CM | POA: Diagnosis not present

## 2016-07-03 DIAGNOSIS — E669 Obesity, unspecified: Secondary | ICD-10-CM | POA: Diagnosis not present

## 2016-07-03 DIAGNOSIS — R42 Dizziness and giddiness: Secondary | ICD-10-CM | POA: Diagnosis not present

## 2016-07-06 ENCOUNTER — Encounter: Payer: Self-pay | Admitting: Physician Assistant

## 2016-07-06 NOTE — Telephone Encounter (Signed)
Letter printed and will be placed up front for pick up

## 2016-07-06 NOTE — Telephone Encounter (Signed)
Mickel Baas spoke to Mrs. Stadler and said that either an MD or PA could sign. sd

## 2016-07-06 NOTE — Telephone Encounter (Signed)
If this has to be signed by an MD might be better to get Caryn Section or Rosanna Randy. If PA can do it then I will work on that. Just let me know.

## 2016-07-06 NOTE — Telephone Encounter (Signed)
Patient advised as below.  

## 2016-07-08 ENCOUNTER — Ambulatory Visit (INDEPENDENT_AMBULATORY_CARE_PROVIDER_SITE_OTHER): Payer: BLUE CROSS/BLUE SHIELD | Admitting: Physician Assistant

## 2016-07-08 ENCOUNTER — Encounter: Payer: Self-pay | Admitting: Physician Assistant

## 2016-07-08 VITALS — BP 110/70 | HR 91 | Temp 98.0°F | Resp 16 | Wt 228.0 lb

## 2016-07-08 DIAGNOSIS — E1165 Type 2 diabetes mellitus with hyperglycemia: Secondary | ICD-10-CM | POA: Diagnosis not present

## 2016-07-08 DIAGNOSIS — E1142 Type 2 diabetes mellitus with diabetic polyneuropathy: Secondary | ICD-10-CM | POA: Diagnosis not present

## 2016-07-08 DIAGNOSIS — IMO0002 Reserved for concepts with insufficient information to code with codable children: Secondary | ICD-10-CM

## 2016-07-08 NOTE — Progress Notes (Signed)
Patient: Gabriella Marsh Female    DOB: 23-Jun-1966   50 y.o.   MRN: [id number] Visit Date: 07/09/2016  Today's Provider: Mar Daring, PA-C   Chief Complaint  Patient presents with  . Follow-up    Disability and diabetes   Subjective:    HPI Patient is here to follow-up on Disability.   Diabetes Mellitus Type II, Follow-up:   Lab Results  Component Value Date   HGBA1C 13.4 07/09/2016   HGBA1C 8.8 02/04/2016   HGBA1C 9.2 10/22/2015    Last seen for diabetes 5 months ago.  Management since then includes None. She saw Philemon Kingdom (Int Med) for diabetes and was put on Basaglar. She was to continue Tanzeum, Metformin and Glipizide. She has not been using the Tanzeum. She reports excellent compliance with treatment. She is not having side effects.  Current symptoms include visual disturbances and have been stable.Per patient she has gain weight and stays hungry. Home blood sugar records: 150-250 in arrange  Episodes of hypoglycemia? no   Current Insulin Regimen: Basaglar Most Recent Eye Exam: 05/2015 She is due for a visit. Weight trend: fluctuating a bit Current diet: in general, a "healthy" diet  , diabetic Current exercise: walks aorund the house and leg work while sitting and arms stretches.  Pertinent Labs:    Component Value Date/Time   CHOL 167 08/28/2015 1030   TRIG 205* 08/28/2015 1030   HDL 50 08/28/2015 1030   LDLCALC 76 08/28/2015 1030   CREATININE 0.59 06/28/2015 0700   CREATININE 0.6 02/20/2015    Wt Readings from Last 3 Encounters:  07/08/16 228 lb (103.42 kg)  02/04/16 226 lb (102.513 kg)  02/03/16 227 lb (102.967 kg)   -----------------------------------------------------------------------     Allergies  Allergen Reactions  . Pregabalin Shortness Of Breath   Current Meds  Medication Sig  . atorvastatin (LIPITOR) 20 MG tablet Take 1 tablet (20 mg total) by mouth daily.  Marland Kitchen gabapentin (NEURONTIN) 100 MG capsule Take 2  capsules (200 mg total) by mouth 3 (three) times daily. Along with 600 mg three times daily (TDD 2400 mg daily)  . gabapentin (NEURONTIN) 600 MG tablet Take 1 tablet (600 mg total) by mouth 3 (three) times daily.  Marland Kitchen glipiZIDE (GLUCOTROL) 10 MG tablet Take 5 mg by mouth 2 (two) times daily before a meal.  . glucose blood test strip Check blood sugars twice daily. OneTouch Verio strips.  . Insulin Glargine (BASAGLAR KWIKPEN) 100 UNIT/ML Solostar Pen Inject 15 Units into the skin daily at 10 pm.  . Insulin Pen Needle (CAREFINE PEN NEEDLES) 32G X 4 MM MISC Use 1x a day  . metFORMIN (GLUCOPHAGE-XR) 500 MG 24 hr tablet TAKE 2 TABLETS TWICE A DAY WITH MEALS.  Marland Kitchen nortriptyline (PAMELOR) 25 MG capsule TAKE 2 CAPSULES AT BEDTIME  . oxyCODONE-acetaminophen (PERCOCET/ROXICET) 5-325 MG tablet Take 1 tablet by mouth every 6 (six) hours as needed for severe pain.  Marland Kitchen venlafaxine (EFFEXOR) 75 MG tablet Take 1 tablet (75 mg total) by mouth 2 (two) times daily. (Patient taking differently: Take 150 mg by mouth at bedtime. 75 mg in the am, 150 mg qhs)  . [DISCONTINUED] empagliflozin (JARDIANCE) 25 MG TABS tablet Take 25 mg by mouth daily.    Review of Systems  Constitutional: Positive for fatigue.  Eyes: Positive for visual disturbance.  Respiratory: Negative for cough, chest tightness and shortness of breath.   Cardiovascular: Negative for chest pain, palpitations and leg swelling.  Gastrointestinal:  Negative for nausea and abdominal pain.  Endocrine: Positive for polydipsia.  Genitourinary: Negative for dysuria, flank pain and enuresis.  Neurological: Negative for dizziness, light-headedness, numbness and headaches.    Social History  Substance Use Topics  . Smoking status: Former Research scientist (life sciences)  . Smokeless tobacco: Never Used  . Alcohol Use: No   Objective:   BP 110/70 mmHg  Pulse 91  Temp(Src) 98 F (36.7 C) (Oral)  Resp 16  Wt 228 lb (103.42 kg)  Physical Exam  Constitutional: She appears  well-developed and well-nourished. No distress.  Neck: Normal range of motion. Neck supple. No JVD present. No tracheal deviation present. No thyromegaly present.  Cardiovascular: Normal rate, regular rhythm and normal heart sounds.  Exam reveals no gallop and no friction rub.   No murmur heard. Pulmonary/Chest: Effort normal and breath sounds normal. No respiratory distress. She has no wheezes. She has no rales.  Musculoskeletal: She exhibits no edema.  Lymphadenopathy:    She has no cervical adenopathy.  Skin: She is not diaphoretic.  Vitals reviewed.     Assessment & Plan:     1. Uncontrolled type 2 diabetes mellitus with peripheral neuropathy ([facility]) HgBA1c increased to 13.4. Will add tanzeum back to regimen. She is to call if insurance does not cover. She is to continue all other medications as prescribed. I will see her back in 1 month to recheck. If no improvement will start increasing Basaglar.  - POCT HgB A1C - Albiglutide (TANZEUM) 50 MG PEN; ADMINISTER 50 MG UNDER THE SKIN WEEKLY  Dispense: 4 each; Refill: [place], PA-C  [facility]

## 2016-07-09 LAB — POCT GLYCOSYLATED HEMOGLOBIN (HGB A1C)
Est. average glucose Bld gHb Est-mCnc: >326
HEMOGLOBIN A1C: 13.4

## 2016-07-09 MED ORDER — ALBIGLUTIDE 50 MG ~~LOC~~ PEN: PEN_INJECTOR | SUBCUTANEOUS | Status: DC

## 2016-07-15 ENCOUNTER — Other Ambulatory Visit: Payer: Self-pay | Admitting: Physician Assistant

## 2016-07-15 DIAGNOSIS — E1340 Other specified diabetes mellitus with diabetic neuropathy, unspecified: Secondary | ICD-10-CM

## 2016-07-15 MED ORDER — OXYCODONE-ACETAMINOPHEN 5-325 MG PO TABS: 1.0000 | ORAL_TABLET | Freq: Four times a day (QID) | ORAL | 0 refills | Status: DC | PRN

## 2016-07-15 NOTE — Telephone Encounter (Signed)
Pt contacted office for refill request on the following medications:  HYDROCODON-ACETAMINOPHEN 5-325.  [id number]

## 2016-07-15 NOTE — Telephone Encounter (Signed)
Spoke with patient and verified the medication and patient stated it is the Oxycodone-Acetaminophen.  Thanks,  -Peyten Punches

## 2016-07-20 ENCOUNTER — Other Ambulatory Visit: Payer: Self-pay | Admitting: Family Medicine

## 2016-07-20 DIAGNOSIS — E1340 Other specified diabetes mellitus with diabetic neuropathy, unspecified: Secondary | ICD-10-CM

## 2016-07-20 DIAGNOSIS — IMO0002 Reserved for concepts with insufficient information to code with codable children: Secondary | ICD-10-CM

## 2016-07-20 DIAGNOSIS — E1165 Type 2 diabetes mellitus with hyperglycemia: Principal | ICD-10-CM

## 2016-07-20 DIAGNOSIS — E1142 Type 2 diabetes mellitus with diabetic polyneuropathy: Secondary | ICD-10-CM

## 2016-07-30 DIAGNOSIS — Z6832 Body mass index (BMI) 32.0-32.9, adult: Secondary | ICD-10-CM | POA: Diagnosis not present

## 2016-07-30 DIAGNOSIS — M47816 Spondylosis without myelopathy or radiculopathy, lumbar region: Secondary | ICD-10-CM | POA: Diagnosis not present

## 2016-07-30 DIAGNOSIS — M5136 Other intervertebral disc degeneration, lumbar region: Secondary | ICD-10-CM | POA: Diagnosis not present

## 2016-07-30 DIAGNOSIS — M5126 Other intervertebral disc displacement, lumbar region: Secondary | ICD-10-CM | POA: Diagnosis not present

## 2016-08-12 ENCOUNTER — Ambulatory Visit (INDEPENDENT_AMBULATORY_CARE_PROVIDER_SITE_OTHER): Payer: BLUE CROSS/BLUE SHIELD | Admitting: Physician Assistant

## 2016-08-12 ENCOUNTER — Encounter: Payer: Self-pay | Admitting: Physician Assistant

## 2016-08-12 VITALS — BP 120/70 | HR 102 | Temp 97.2°F | Resp 16 | Wt 228.4 lb

## 2016-08-12 DIAGNOSIS — E1142 Type 2 diabetes mellitus with diabetic polyneuropathy: Secondary | ICD-10-CM | POA: Diagnosis not present

## 2016-08-12 DIAGNOSIS — G479 Sleep disorder, unspecified: Secondary | ICD-10-CM

## 2016-08-12 DIAGNOSIS — E1165 Type 2 diabetes mellitus with hyperglycemia: Secondary | ICD-10-CM

## 2016-08-12 DIAGNOSIS — R8290 Unspecified abnormal findings in urine: Secondary | ICD-10-CM | POA: Diagnosis not present

## 2016-08-12 DIAGNOSIS — N3000 Acute cystitis without hematuria: Secondary | ICD-10-CM

## 2016-08-12 DIAGNOSIS — IMO0002 Reserved for concepts with insufficient information to code with codable children: Secondary | ICD-10-CM

## 2016-08-12 DIAGNOSIS — M5126 Other intervertebral disc displacement, lumbar region: Secondary | ICD-10-CM | POA: Diagnosis not present

## 2016-08-12 LAB — POCT URINALYSIS DIPSTICK
Bilirubin, UA: NEGATIVE
Glucose, UA: NEGATIVE
KETONES UA: NEGATIVE
NITRITE UA: NEGATIVE
PH UA: 5
PROTEIN UA: NEGATIVE
Spec Grav, UA: 1.02
UROBILINOGEN UA: 0.2

## 2016-08-12 LAB — POCT GLYCOSYLATED HEMOGLOBIN (HGB A1C)
Est. average glucose Bld gHb Est-mCnc: 275
HEMOGLOBIN A1C: 11.2

## 2016-08-12 LAB — POCT UA - MICROALBUMIN: MICROALBUMIN (UR) POC: 50 mg/L

## 2016-08-12 MED ORDER — SULFAMETHOXAZOLE-TRIMETHOPRIM 800-160 MG PO TABS: 1.0000 | ORAL_TABLET | Freq: Two times a day (BID) | ORAL | 0 refills | Status: DC

## 2016-08-12 MED ORDER — CLONAZEPAM 0.5 MG PO TABS: 0.5000 mg | ORAL_TABLET | Freq: Every day | ORAL | 1 refills | Status: DC

## 2016-08-12 NOTE — Progress Notes (Signed)
Patient: Gabriella Marsh Female    DOB: 1966/01/06   50 y.o.   MRN: [id number] Visit Date: 08/12/2016  Today's Provider: Mar Daring, PA-C   Chief Complaint  Patient presents with  . Follow-up    diabetes  . Back Pain   Subjective:    HPI  Diabetes Mellitus Type II, Follow-up:   Lab Results  Component Value Date   HGBA1C 11.2 08/12/2016   HGBA1C 13.4 07/09/2016   HGBA1C 8.8 02/04/2016    Last seen for diabetes 1 months ago.  Management since then includes added Tanzeum regimen. She reports excellent compliance with treatment. She is not having side effects.  Current symptoms include polydipsia and polyuria and have been unchanged.She reports that she gets dry mouth a lot. Home blood sugar records: 150-250 She reports that the injection/manufacture is not working well. Episodes of hypoglycemia? no   Current Insulin Regimen: Basaglar andRanzeum Most Recent Eye Exam: she reports she needs to schedule appointment. Weight trend: stable Prior visit with dietician: no Current diet: in general, a "healthy" diet  She reports that she has done a ot of changes. Specially following a diabetic diet. Current exercise: walking  Pertinent Labs:    Component Value Date/Time   CHOL 167 08/28/2015 1030   TRIG 205 (H) 08/28/2015 1030   HDL 50 08/28/2015 1030   LDLCALC 76 08/28/2015 1030   CREATININE 0.59 06/28/2015 0700    Wt Readings from Last 3 Encounters:  08/12/16 228 lb 6.4 oz (103.6 kg)  07/08/16 228 lb (103.4 kg)  02/04/16 226 lb (102.5 kg)    ------------------------------------------------------------------------ Back Pain/right side: She reports this started about 4 days ago. She goes to her back doctor today at 3 pm. She reports that she has notice some odor in her urine.    Allergies  Allergen Reactions  . Pregabalin Shortness Of Breath   Current Meds  Medication Sig  . Albiglutide (TANZEUM) 50 MG PEN ADMINISTER 50 MG UNDER THE SKIN WEEKLY    . atorvastatin (LIPITOR) 20 MG tablet Take 1 tablet (20 mg total) by mouth daily.  Marland Kitchen gabapentin (NEURONTIN) 100 MG capsule TAKE 2 CAPSULES (200 MG) THREE TIMES A DAY. (ALONG WITH 600 MG THREE TIMES A DAY FOR TOTAL DAILY DOSE OF 2400 MG DAILY)  . gabapentin (NEURONTIN) 600 MG tablet Take 1 tablet (600 mg total) by mouth 3 (three) times daily.  Marland Kitchen glipiZIDE (GLUCOTROL) 10 MG tablet Take 5 mg by mouth 2 (two) times daily before a meal.  . glucose blood test strip Check blood sugars twice daily. OneTouch Verio strips.  . Insulin Glargine (BASAGLAR KWIKPEN) 100 UNIT/ML Solostar Pen Inject 15 Units into the skin daily at 10 pm.  . Insulin Pen Needle (CAREFINE PEN NEEDLES) 32G X 4 MM MISC Use 1x a day  . metFORMIN (GLUCOPHAGE-XR) 500 MG 24 hr tablet TAKE 2 TABLETS TWICE A DAY WITH MEALS.  . Multiple Vitamin (MULTI-VITAMINS) TABS Take by mouth.  . nortriptyline (PAMELOR) 25 MG capsule TAKE 2 CAPSULES AT BEDTIME  . oxyCODONE-acetaminophen (PERCOCET/ROXICET) 5-325 MG tablet Take 1 tablet by mouth every 6 (six) hours as needed for severe pain.  Marland Kitchen venlafaxine (EFFEXOR) 75 MG tablet Take 1 tablet (75 mg total) by mouth 2 (two) times daily. (Patient taking differently: Take 150 mg by mouth at bedtime. 75 mg in the am, 150 mg qhs)    Review of Systems  Constitutional: Negative.   Respiratory: Negative.   Cardiovascular: Negative for chest  pain, palpitations and leg swelling.  Gastrointestinal: Positive for abdominal pain. Negative for constipation, diarrhea and nausea.  Endocrine: Positive for polydipsia and polyuria.  Genitourinary: Positive for frequency. Negative for dysuria, flank pain and pelvic pain.  Musculoskeletal: Positive for back pain.  Neurological: Negative for headaches.    Social History  Substance Use Topics  . Smoking status: Former Research scientist (life sciences)  . Smokeless tobacco: Never Used  . Alcohol use No   Objective:   BP 120/70 (BP Location: Left Arm, Patient Position: Sitting, Cuff Size:  Normal)   Pulse (!) 102   Temp 97.2 F (36.2 C) (Oral)   Resp 16   Wt 228 lb 6.4 oz (103.6 kg)   BMI 32.77 kg/m   Physical Exam  Constitutional: She appears well-developed and well-nourished. No distress.  Neck: Normal range of motion. Neck supple. No JVD present. No tracheal deviation present. No thyromegaly present.  Cardiovascular: Normal rate, regular rhythm and normal heart sounds.  Exam reveals no gallop and no friction rub.   No murmur heard. Pulmonary/Chest: Effort normal and breath sounds normal. No respiratory distress. She has no wheezes. She has no rales.  Abdominal: Soft. Bowel sounds are normal. She exhibits no distension and no mass. There is tenderness in the suprapubic area. There is no rebound, no guarding and no CVA tenderness.  Lymphadenopathy:    She has no cervical adenopathy.  Skin: She is not diaphoretic.  Vitals reviewed.     Assessment & Plan:     1. Uncontrolled type 2 diabetes mellitus with peripheral neuropathy ([place]) HgBA1c has improved to 11.2 from 13.4 with adding Tanzeum back to regimen. Urine microalbumin was 50 today. Will recheck in 3 months. - POCT glycosylated hemoglobin (Hb A1C) - POCT UA - Microalbumin  2. Bad odor of urine UA positive. - POCT urinalysis dipstick  3. Difficulty sleeping Stable. Diagnosis pulled for medication refill. Continue current medical treatment plan. - clonazePAM (KLONOPIN) 0.5 MG tablet; Take 1 tablet (0.5 mg total) by mouth at bedtime.  Dispense: 30 tablet; Refill: 1  4. Acute cystitis without hematuria Ua positive. Bactrim given for empiric treatment. Will send urine for culture and f/u pending results. She needs to stay well hydrated. Call if symptoms do not improve.  - Urine Culture - sulfamethoxazole-trimethoprim (BACTRIM DS,SEPTRA DS) 800-160 MG tablet; Take 1 tablet by mouth 2 (two) times daily.  Dispense: 14 tablet; Refill: 0       Mar Daring, PA-C  [facility]

## 2016-08-12 NOTE — Patient Instructions (Addendum)
Urinary Tract Infection Urinary tract infections (UTIs) can develop anywhere along your urinary tract. Your urinary tract is your body's drainage system for removing wastes and extra water. Your urinary tract includes two kidneys, two ureters, a bladder, and a urethra. Your kidneys are a pair of bean-shaped organs. Each kidney is about the size of your fist. They are located below your ribs, one on each side of your spine. CAUSES Infections are caused by microbes, which are microscopic organisms, including fungi, viruses, and bacteria. These organisms are so small that they can only be seen through a microscope. Bacteria are the microbes that most commonly cause UTIs. SYMPTOMS  Symptoms of UTIs may vary by age and gender of the patient and by the location of the infection. Symptoms in young women typically include a frequent and intense urge to urinate and a painful, burning feeling in the bladder or urethra during urination. Older women and men are more likely to be tired, shaky, and weak and have muscle aches and abdominal pain. A fever may mean the infection is in your kidneys. Other symptoms of a kidney infection include pain in your back or sides below the ribs, nausea, and vomiting. DIAGNOSIS To diagnose a UTI, your caregiver will ask you about your symptoms. Your caregiver will also ask you to provide a urine sample. The urine sample will be tested for bacteria and white blood cells. White blood cells are made by your body to help fight infection. TREATMENT  Typically, UTIs can be treated with medication. Because most UTIs are caused by a bacterial infection, they usually can be treated with the use of antibiotics. The choice of antibiotic and length of treatment depend on your symptoms and the type of bacteria causing your infection. HOME CARE INSTRUCTIONS  If you were prescribed antibiotics, take them exactly as your caregiver instructs you. Finish the medication even if you feel better after  you have only taken some of the medication.  Drink enough water and fluids to keep your urine clear or pale yellow.  Avoid caffeine, tea, and carbonated beverages. They tend to irritate your bladder.  Empty your bladder often. Avoid holding urine for long periods of time.  Empty your bladder before and after sexual intercourse.  After a bowel movement, women should cleanse from front to back. Use each tissue only once. SEEK MEDICAL CARE IF:   You have back pain.  You develop a fever.  Your symptoms do not begin to resolve within 3 days. SEEK IMMEDIATE MEDICAL CARE IF:   You have severe back pain or lower abdominal pain.  You develop chills.  You have nausea or vomiting.  You have continued burning or discomfort with urination. MAKE SURE YOU:   Understand these instructions.  Will watch your condition.  Will get help right away if you are not doing well or get worse.   This information is not intended to replace advice given to you by your health care provider. Make sure you discuss any questions you have with your health care provider.   Document Released: 09/16/2005 Document Revised: 08/28/2015 Document Reviewed: 01/15/2012 Elsevier Interactive Patient Education 2016 [employer]  Clonazepam tablets What is this medicine? CLONAZEPAM (kloe NA ze pam) is a benzodiazepine. It is used to treat certain types of seizures. It is also used to treat panic disorder. This medicine may be used for other purposes; ask your health care provider or pharmacist if you have questions. What should I tell my health care provider before I  take this medicine? They need to know if you have any of these conditions: -an alcohol or drug abuse problem -bipolar disorder, depression, psychosis or other mental health condition -glaucoma -kidney or liver disease -lung or breathing disease -myasthenia gravis -Parkinson's disease -porphyria -seizures or a history of seizures -suicidal  thoughts -an unusual or allergic reaction to clonazepam, other benzodiazepines, foods, dyes, or preservatives -pregnant or trying to get pregnant -breast-feeding How should I use this medicine? Take this medicine by mouth with a glass of water. Follow the directions on the prescription label. If it upsets your stomach, take it with food or milk. Take your medicine at regular intervals. Do not take it more often than directed. Do not stop taking or change the dose except on the advice of your doctor or health care professional. A special MedGuide will be given to you by the pharmacist with each prescription and refill. Be sure to read this information carefully each time. Talk to your pediatrician regarding the use of this medicine in children. Special care may be needed. Overdosage: If you think you have taken too much of this medicine contact a poison control center or emergency room at once. NOTE: This medicine is only for you. Do not share this medicine with others. What if I miss a dose? If you miss a dose, take it as soon as you can. If it is almost time for your next dose, take only that dose. Do not take double or extra doses. What may interact with this medicine? -herbal or dietary supplements -medicines for depression, anxiety, or psychotic disturbances -medicines for fungal infections like fluconazole, itraconazole, ketoconazole, voriconazole -medicines for HIV infection or AIDS -medicines for sleep -prescription pain medicines -propantheline -rifampin -sevelamer -some medicines for seizures like carbamazepine, phenobarbital, phenytoin, primidone This list may not describe all possible interactions. Give your health care provider a list of all the medicines, herbs, non-prescription drugs, or dietary supplements you use. Also tell them if you smoke, drink alcohol, or use illegal drugs. Some items may interact with your medicine. What should I watch for while using this  medicine? Visit your doctor or health care professional for regular checks on your progress. Your body may become dependent on this medicine. If you have been taking this medicine regularly for some time, do not suddenly stop taking it. You must gradually reduce the dose or you may get severe side effects. Ask your doctor or health care professional for advice before increasing or decreasing the dose. Even after you stop taking this medicine it can still affect your body for several days. If you suffer from several types of seizures, this medicine may increase the chance of grand mal seizures (epilepsy). Let your doctor or health care professional know, he or she may want to prescribe an additional medicine. You may get drowsy or dizzy. Do not drive, use machinery, or do anything that needs mental alertness until you know how this medicine affects you. To reduce the risk of dizzy and fainting spells, do not stand or sit up quickly, especially if you are an older patient. Alcohol may increase dizziness and drowsiness. Avoid alcoholic drinks. Do not treat yourself for coughs, colds or allergies without asking your doctor or health care professional for advice. Some ingredients can increase possible side effects. The use of this medicine may increase the chance of suicidal thoughts or actions. Pay special attention to how you are responding while on this medicine. Any worsening of mood, or thoughts of suicide or dying  should be reported to your health care professional right away. Women who become pregnant while using this medicine may enroll in the [facility] Pregnancy Registry by calling [phone]. This registry collects information about the safety of antiepileptic drug use during pregnancy. What side effects may I notice from receiving this medicine? Side effects that you should report to your doctor or health care professional as soon as possible: -allergic reactions like skin  rash, itching or hives, swelling of the face, lips, or tongue -changes in vision -confusion -depression -hallucinations -mood changes, excitability or aggressive behavior -movement difficulty, staggering or jerky movements -muscle cramps, weakness -tremors -unusual eye movements Side effects that usually do not require medical attention (report to your doctor or health care professional if they continue or are bothersome): -constipation or diarrhea -difficulty sleeping, nightmares -dizziness, drowsiness -headache -increased saliva from your mouth -nausea, vomiting This list may not describe all possible side effects. Call your doctor for medical advice about side effects. You may report side effects to FDA at 1-800-FDA-1088. Where should I keep my medicine? Keep out of the reach of children. This medicine can be abused. Keep your medicine in a safe place to protect it from theft. Do not share this medicine with anyone. Selling or giving away this medicine is dangerous and against the law. This medicine may cause accidental overdose and death if taken by other adults, children, or pets. Mix any unused medicine with a substance like cat litter or coffee grounds. Then throw the medicine away in a sealed container like a sealed bag or a coffee can with a lid. Do not use the medicine after the expiration date. Store at room temperature between 15 and 30 degrees C (59 and 86 degrees F). Protect from light. Keep container tightly closed. NOTE: This sheet is a summary. It may not cover all possible information. If you have questions about this medicine, talk to your doctor, pharmacist, or health care provider.    2016, Elsevier/Gold Standard. (2015-03-19 14:01:43)  Sulfamethoxazole; Trimethoprim, SMX-TMP tablets What is this medicine? SULFAMETHOXAZOLE; TRIMETHOPRIM or SMX-TMP (suhl fuh meth OK suh zohl; trye METH oh prim) is a combination of a sulfonamide antibiotic and a second antibiotic,  trimethoprim. It is used to treat or prevent certain kinds of bacterial infections. It will not work for colds, flu, or other viral infections. This medicine may be used for other purposes; ask your health care provider or pharmacist if you have questions. What should I tell my health care provider before I take this medicine? They need to know if you have any of these conditions: -anemia -asthma -being treated with anticonvulsants -if you frequently drink alcohol containing drinks -kidney disease -liver disease -low level of folic acid or [id number] dehydrogenase -poor nutrition or malabsorption -porphyria -severe allergies -thyroid disorder -an unusual or allergic reaction to sulfamethoxazole, trimethoprim, sulfa drugs, other medicines, foods, dyes, or preservatives -pregnant or trying to get pregnant -breast-feeding How should I use this medicine? Take this medicine by mouth with a full glass of water. Follow the directions on the prescription label. Take your medicine at regular intervals. Do not take it more often than directed. Do not skip doses or stop your medicine early. Talk to your pediatrician regarding the use of this medicine in children. Special care may be needed. This medicine has been used in children as young as 2 months of age. Overdosage: If you think you have taken too much of this medicine contact a poison control center or emergency  room at once. NOTE: This medicine is only for you. Do not share this medicine with others. What if I miss a dose? If you miss a dose, take it as soon as you can. If it is almost time for your next dose, take only that dose. Do not take double or extra doses. What may interact with this medicine? Do not take this medicine with any of the following medications: -aminobenzoate potassium -dofetilide -metronidazole This medicine may also interact with the following medications: -ACE inhibitors like benazepril, enalapril,  lisinopril, and ramipril -birth control pills -cyclosporine -digoxin -diuretics -indomethacin -medicines for diabetes -methenamine -methotrexate -phenytoin -potassium supplements -pyrimethamine -sulfinpyrazone -tricyclic antidepressants -warfarin This list may not describe all possible interactions. Give your health care provider a list of all the medicines, herbs, non-prescription drugs, or dietary supplements you use. Also tell them if you smoke, drink alcohol, or use illegal drugs. Some items may interact with your medicine. What should I watch for while using this medicine? Tell your doctor or health care professional if your symptoms do not improve. Drink several glasses of water a day to reduce the risk of kidney problems. Do not treat diarrhea with over the counter products. Contact your doctor if you have diarrhea that lasts more than 2 days or if it is severe and watery. This medicine can make you more sensitive to the sun. Keep out of the sun. If you cannot avoid being in the sun, wear protective clothing and use a sunscreen. Do not use sun lamps or tanning beds/booths. What side effects may I notice from receiving this medicine? Side effects that you should report to your doctor or health care professional as soon as possible: -allergic reactions like skin rash or hives, swelling of the face, lips, or tongue -breathing problems -fever or chills, sore throat -irregular heartbeat, chest pain -joint or muscle pain -pain or difficulty passing urine -red pinpoint spots on skin -redness, blistering, peeling or loosening of the skin, including inside the mouth -unusual bleeding or bruising -unusually weak or tired -yellowing of the eyes or skin Side effects that usually do not require medical attention (report to your doctor or health care professional if they continue or are bothersome): -diarrhea -dizziness -headache -loss of appetite -nausea, vomiting -nervousness This  list may not describe all possible side effects. Call your doctor for medical advice about side effects. You may report side effects to FDA at 1-800-FDA-1088. Where should I keep my medicine? Keep out of the reach of children. Store at room temperature between 20 to 25 degrees C (68 to 77 degrees F). Protect from light. Throw away any unused medicine after the expiration date. NOTE: This sheet is a summary. It may not cover all possible information. If you have questions about this medicine, talk to your doctor, pharmacist, or health care provider.    2016, Elsevier/[employer] Standard. (2013-07-14 14:38:26)

## 2016-08-14 ENCOUNTER — Telehealth: Payer: Self-pay

## 2016-08-14 LAB — URINE CULTURE

## 2016-08-14 NOTE — Telephone Encounter (Signed)
Patient advised as below. Patient verbalizes understanding and is in agreement with treatment plan.  

## 2016-08-14 NOTE — Telephone Encounter (Signed)
-----   Message from Mar Daring, [place] sent at 08/14/2016 12:35 PM EDT ----- Urine culture was positive for strep b. This bacteria can be commonly found in the vagina but can cause UTI with uncontrolled diabetes allowing bacteria to flourish. It is, however, susceptible to bactrim. Continue antibiotic until completed and call if symptoms fail to improve with completed antibiotic therapy.

## 2016-08-21 DIAGNOSIS — M5126 Other intervertebral disc displacement, lumbar region: Secondary | ICD-10-CM | POA: Diagnosis not present

## 2016-08-21 DIAGNOSIS — M5136 Other intervertebral disc degeneration, lumbar region: Secondary | ICD-10-CM | POA: Diagnosis not present

## 2016-08-28 ENCOUNTER — Other Ambulatory Visit: Payer: Self-pay | Admitting: Physician Assistant

## 2016-08-28 DIAGNOSIS — E1340 Other specified diabetes mellitus with diabetic neuropathy, unspecified: Secondary | ICD-10-CM

## 2016-08-28 NOTE — Telephone Encounter (Signed)
Pt contacted office for refill request on the following medications: oxyCODONE-acetaminophen (PERCOCET/ROXICET) 5-325 MG tablet Last Written: 07/15/16 Last OV: 08/12/16 Please advise. Thanks TNP

## 2016-08-31 ENCOUNTER — Telehealth: Payer: Self-pay

## 2016-08-31 MED ORDER — OXYCODONE-ACETAMINOPHEN 5-325 MG PO TABS: 1.0000 | ORAL_TABLET | Freq: Four times a day (QID) | ORAL | 0 refills | Status: DC | PRN

## 2016-08-31 NOTE — Telephone Encounter (Signed)
Advised patient prescription for the Oxycodone-Acetaminophen 5-325 MG is ready for pick up.  Thanks,  -Joseline

## 2016-09-08 ENCOUNTER — Other Ambulatory Visit: Payer: Self-pay | Admitting: Physician Assistant

## 2016-09-08 DIAGNOSIS — IMO0002 Reserved for concepts with insufficient information to code with codable children: Secondary | ICD-10-CM

## 2016-09-08 DIAGNOSIS — E1142 Type 2 diabetes mellitus with diabetic polyneuropathy: Secondary | ICD-10-CM

## 2016-09-08 DIAGNOSIS — E1165 Type 2 diabetes mellitus with hyperglycemia: Principal | ICD-10-CM

## 2016-09-10 DIAGNOSIS — G43719 Chronic migraine without aura, intractable, without status migrainosus: Secondary | ICD-10-CM | POA: Diagnosis not present

## 2016-09-30 ENCOUNTER — Encounter: Payer: Self-pay | Admitting: Emergency Medicine

## 2016-09-30 ENCOUNTER — Ambulatory Visit
Admission: EM | Admit: 2016-09-30 | Discharge: 2016-09-30 | Disposition: A | Discharge status: Home / Self Care | Payer: BLUE CROSS/BLUE SHIELD | Attending: Family Medicine | Admitting: Family Medicine

## 2016-09-30 DIAGNOSIS — N390 Urinary tract infection, site not specified: Secondary | ICD-10-CM | POA: Diagnosis not present

## 2016-09-30 DIAGNOSIS — R319 Hematuria, unspecified: Secondary | ICD-10-CM | POA: Diagnosis not present

## 2016-09-30 LAB — URINALYSIS COMPLETE WITH MICROSCOPIC (ARMC ONLY)
Glucose, UA: 250 mg/dL — AB
KETONES UR: 15 mg/dL — AB
NITRITE: NEGATIVE
PH: 5 (ref 5.0–8.0)

## 2016-09-30 MED ORDER — CIPROFLOXACIN HCL 500 MG PO TABS: 500.0000 mg | ORAL_TABLET | Freq: Two times a day (BID) | ORAL | 0 refills | Status: AC

## 2016-09-30 NOTE — ED Triage Notes (Signed)
Patient states she developed pressure in her lower abdomen and noticed blood in her urine.

## 2016-09-30 NOTE — Discharge Instructions (Signed)
Take medication as prescribed. Rest. Drink plenty of fluids.  ° °Follow up with your primary care physician this week. Return to Urgent care for new or worsening concerns.  ° °

## 2016-09-30 NOTE — ED Provider Notes (Signed)
[facility] ____________________________________________  Time seen: Approximately 7:10 PM  I have reviewed the triage vital signs and the nursing notes.   HISTORY  Chief Complaint Hematuria   HPI Gabriella Marsh is a 50 y.o. female patient presenting for the complaints of urinary frequency, blood in urine and mild suprapubic pressure for the last day. Patient reports history of very tract infections in the past with similar symptoms. Patient reports last urinary tract infection was one month ago and treated with oral Bactrim. Denies any other recent UTIs. Denies any other recent antibiotic use. Denies any vaginal bleeding. Patient reports history of partial hysterectomy. Denies vaginal bleeding, vaginal discharge, vaginal odor for concerns of STDs. Reports continues to eat and drink well. Denies nausea, vomiting, diarrhea or constipation. Denies abdominal pain or back pain. Denies fevers. Denies other complaints.  Mar Daring, PA-C PCP No LMP recorded. Patient has had a hysterectomy.    Past Medical History:  Diagnosis Date  . Diabetes mellitus without complication ([place])   . Hyperlipidemia   . Hypertension     Patient Active Problem List   Diagnosis Date Noted  . Benign positional vertigo 02/03/2016  . Uncontrolled type 2 diabetes mellitus with peripheral neuropathy ([place]) 07/22/2015  . Common migraine with intractable migraine 06/18/2015  . Anxiety 05/04/2015  . Adaptation reaction 05/03/2015  . DDD (degenerative disc disease), lumbosacral 05/03/2015  . Hypercholesteremia 05/03/2015  . Insomnia due to medical condition 05/03/2015  . Adiposity 05/03/2015  . Disorder of peripheral nervous system ([place]) 05/03/2015  . Snores 05/03/2015  . Diabetic neuropathy ([place]) 03/06/2015  . Essential hypertension 03/06/2015  . Elevated liver function tests 03/06/2015  . Obesity 03/06/2015  . GERD (gastroesophageal reflux disease) 03/06/2015  . Multinodular goiter  03/06/2015  . RLS (restless legs syndrome) 03/06/2015  . Leg cramps 03/06/2015    Past Surgical History:  Procedure Laterality Date  . ABDOMINAL HYSTERECTOMY     But still has cervix  . CESAREAN SECTION    . CHOLECYSTECTOMY    . OVARIAN CYST SURGERY    . SHOULDER SURGERY Left   . TONSILLECTOMY AND ADENOIDECTOMY    . UTERINE FIBROID SURGERY      Current Outpatient Rx  . Order #: [id number] Class: Normal  . Order #: [id number] Class: Normal  . Order #: [id number] Class: Historical Med  . Order #: [id number] Class: Normal  . Order #: [id number] Class: Normal  . Order #: [id number] Class: Normal  . Order #: [id number] Class: Normal  . Order #: [id number] Class: Normal  . Order #: [id number] Class: Print  . Order #: [id number] Class: Normal  . Order #: [id number] Class: Historical Med  . Order #: [id number] Class: Normal  . Order #: [id number] Class: Print  . Order #: [id number] Class: Normal  . Order #: [id number] Class: Normal  . Order #: [id number] Class: Normal    No current facility-administered medications for this encounter.   Current Outpatient Prescriptions:  .  gabapentin (NEURONTIN) 100 MG capsule, TAKE 2 CAPSULES (200 MG) THREE TIMES A DAY. (ALONG WITH 600 MG THREE TIMES A DAY FOR TOTAL DAILY DOSE OF 2400 MG DAILY), Disp: 180 capsule, Rfl: 1 .  gabapentin (NEURONTIN) 600 MG tablet, Take 1 tablet (600 mg total) by mouth 3 (three) times daily., Disp: 270 tablet, Rfl: 3 .  glipiZIDE (GLUCOTROL) 10 MG tablet, Take 5 mg by mouth 2 (two) times daily before a meal., Disp: , Rfl:  .  glucose blood test strip, Check blood sugars twice daily. OneTouch Verio strips., Disp: 200 each, Rfl: 5 .  Insulin Pen  Needle (CAREFINE PEN NEEDLES) 32G X 4 MM MISC, Use 1x a day, Disp: 50 each, Rfl: 11 .  metFORMIN (GLUCOPHAGE-XR) 500 MG 24 hr tablet, TAKE 2 TABLETS TWICE A DAY WITH MEALS., Disp: 360 tablet, Rfl: 1 .  atorvastatin (LIPITOR) 20 MG tablet, Take 1 tablet (20 mg total) by mouth daily., Disp: 90 tablet, Rfl:  3 .  ciprofloxacin (CIPRO) 500 MG tablet, Take 1 tablet (500 mg total) by mouth 2 (two) times daily., Disp: 14 tablet, Rfl: 0 .  clonazePAM (KLONOPIN) 0.5 MG tablet, Take 1 tablet (0.5 mg total) by mouth at bedtime., Disp: 30 tablet, Rfl: 1 .  Insulin Glargine (BASAGLAR KWIKPEN) 100 UNIT/ML Solostar Pen, Inject 15 Units into the skin daily at 10 pm., Disp: 15 mL, Rfl: 2 .  Multiple Vitamin (MULTI-VITAMINS) TABS, Take by mouth., Disp: , Rfl:  .  nortriptyline (PAMELOR) 25 MG capsule, TAKE 2 CAPSULES AT BEDTIME, Disp: 180 capsule, Rfl: 1 .  oxyCODONE-acetaminophen (PERCOCET/ROXICET) 5-325 MG tablet, Take 1 tablet by mouth every 6 (six) hours as needed for severe pain., Disp: 60 tablet, Rfl: 0 .  sulfamethoxazole-trimethoprim (BACTRIM DS,SEPTRA DS) 800-160 MG tablet, Take 1 tablet by mouth 2 (two) times daily., Disp: 14 tablet, Rfl: 0 .  TANZEUM 50 MG PEN, ADMINISTER 50 MG UNDER THE SKIN WEEKLY, Disp: 1 each, Rfl: 5 .  venlafaxine (EFFEXOR) 75 MG tablet, Take 1 tablet (75 mg total) by mouth 2 (two) times daily. (Patient taking differently: Take 150 mg by mouth at bedtime. 75 mg in the am, 150 mg qhs), Disp: 180 tablet, Rfl: 1  Allergies Pregabalin  Family History  Problem Relation Age of Onset  . Diabetes Father   . Heart disease Father   . Hypertension Father   . Hyperlipidemia Father   . Congestive Heart Failure Father   . Healthy Brother   . Osteoporosis Mother   . Irritable bowel syndrome Mother   . Osteoporosis Maternal Grandmother     Social History Social History  Substance Use Topics  . Smoking status: Former Research scientist (life sciences)  . Smokeless tobacco: Never Used  . Alcohol use Not on file    Review of Systems Constitutional: No fever/chills Eyes: No visual changes. ENT: No sore throat. Cardiovascular: Denies chest pain. Respiratory: Denies shortness of breath. Gastrointestinal: No abdominal pain.  No nausea, no vomiting.  No diarrhea.  No constipation. Genitourinary: Positive for  dysuria. Musculoskeletal: Negative for back pain. Skin: Negative for rash. Neurological: Negative for headaches, focal weakness or numbness.  10-point ROS otherwise negative.  ____________________________________________   PHYSICAL EXAM:  VITAL SIGNS: ED Triage Vitals  Enc Vitals Group     BP 09/30/16 1839 116/76     Pulse Rate 09/30/16 1839 (!) 104 Recheck 80     Resp 09/30/16 1839 18     Temp 09/30/16 1839 98.7 F (37.1 C)     Temp Source 09/30/16 1839 Tympanic     SpO2 09/30/16 1839 99 %     Weight 09/30/16 1834 229 lb (103.9 kg)     Height 09/30/16 1834 5' 10.5" (1.791 m)     Head Circumference --      Peak Flow --      Pain Score 09/30/16 1839 0     Pain Loc --      Pain Edu? --      Excl. in [place]? --     Constitutional: Alert and oriented. Well appearing and in no acute distress. Eyes: Conjunctivae are normal. PERRL. EOMI. ENT  Head: Normocephalic and atraumatic.      Mouth/Throat: Mucous membranes are moist.Oropharynx non-erythematous. Cardiovascular: Normal rate, regular rhythm. Grossly normal heart sounds.  Good peripheral circulation. Respiratory: Normal respiratory effort without tachypnea nor retractions. Breath sounds are clear and equal bilaterally. No wheezes/rales/rhonchi.. Gastrointestinal: Soft and nontender. No distention. No CVA tenderness. Musculoskeletal:  No midline cervical, thoracic or lumbar tenderness to palpation. Ambulatory with steady gait.  Neurologic:  Normal speech and language. No gross focal neurologic deficits are appreciated. Speech is normal. No gait instability.  Skin:  Skin is warm, dry and intact. No rash noted. Psychiatric: Mood and affect are normal. Speech and behavior are normal. Patient exhibits appropriate insight and judgment   ___________________________________________   LABS (all labs ordered are listed, but only abnormal results are displayed)  Labs Reviewed  URINALYSIS COMPLETEWITH MICROSCOPIC ([facility] ONLY) -  Abnormal; Notable for the following:       Result Value   Color, Urine BROWN (*)    APPearance TURBID (*)    Glucose, UA 250 (*)    Bilirubin Urine MODERATE (*)    Ketones, ur 15 (*)    Specific Gravity, Urine >1.030 (*)    Hgb urine dipstick LARGE (*)    Protein, ur >300 (*)    Leukocytes, UA SMALL (*)    Bacteria, UA MANY (*)    Squamous Epithelial / LPF 6-30 (*)    All other components within normal limits  URINE CULTURE   ____________________________________________   PROCEDURES Procedures    INITIAL IMPRESSION / ASSESSMENT AND PLAN / ED COURSE  Pertinent labs & imaging results that were available during my care of the patient were reviewed by me and considered in my medical decision making (see chart for details).  Well appearing patient. No acute distress. Reports dysuria 1 day. Recent UTI treated with oral Bactrim. Urinalysis reviewed. Urinalysis suggested urinary tract infection, will culture urine. Due to significance of the urine results, as well as recent treatment with oral Bactrim, will place patient on oral Cipro. Discussed use of other medications, and patient requests treatment with Cipro.Discussed indication, risks and benefits of medications with patient. Discussed with patient to have close primary care follow-up and recheck of urine 1 week.  Discussed follow up with Primary care physician this week. Discussed follow up and return parameters including no resolution or any worsening concerns. Patient verbalized understanding and agreed to plan.   ____________________________________________   FINAL CLINICAL IMPRESSION(S) / ED DIAGNOSES  Final diagnoses:  Urinary tract infection with hematuria, site unspecified     New Prescriptions   CIPROFLOXACIN (CIPRO) 500 MG TABLET    Take 1 tablet (500 mg total) by mouth 2 (two) times daily.    Note: This dictation was prepared with Dragon dictation along with smaller phrase technology. Any transcriptional errors  that result from this process are unintentional.    Clinical Course      Marylene Land, NP 09/30/16 1929

## 2016-10-01 ENCOUNTER — Ambulatory Visit: Payer: BLUE CROSS/BLUE SHIELD | Admitting: Physician Assistant

## 2016-10-02 ENCOUNTER — Other Ambulatory Visit: Payer: Self-pay | Admitting: Physician Assistant

## 2016-10-03 LAB — URINE CULTURE

## 2016-10-05 ENCOUNTER — Telehealth: Payer: Self-pay | Admitting: Emergency Medicine

## 2016-10-05 NOTE — Telephone Encounter (Signed)
Patient informed of her urine culture result.  Patient is on Cipro.  Patient states that she is feeling better and her symptoms have improved.  Patient states that she follows up with her PCP this Thursday. Patient was advised to call us if she had any questions or concerns.  Patient verbalized understanding.

## 2016-10-08 ENCOUNTER — Ambulatory Visit: Payer: BLUE CROSS/BLUE SHIELD | Admitting: Physician Assistant

## 2016-10-21 ENCOUNTER — Telehealth: Payer: Self-pay | Admitting: Physician Assistant

## 2016-10-21 NOTE — Telephone Encounter (Signed)
Pt needs refill on her oxycodone 5-325.  Please call when ready [phone]  Thanks teri

## 2016-10-22 MED ORDER — OXYCODONE-ACETAMINOPHEN 5-325 MG PO TABS: 1.0000 | ORAL_TABLET | Freq: Four times a day (QID) | ORAL | 0 refills | Status: DC | PRN

## 2016-10-22 NOTE — Telephone Encounter (Signed)
Patient advised as below.  

## 2016-10-22 NOTE — Telephone Encounter (Signed)
Rx printed

## 2016-11-10 ENCOUNTER — Ambulatory Visit: Payer: BLUE CROSS/BLUE SHIELD | Admitting: Family Medicine

## 2016-11-11 ENCOUNTER — Ambulatory Visit
Admission: RE | Admit: 2016-11-11 | Discharge: 2016-11-11 | Disposition: A | Discharge status: Home / Self Care | Payer: BLUE CROSS/BLUE SHIELD | Source: Ambulatory Visit | Attending: Physician Assistant | Admitting: Physician Assistant

## 2016-11-11 ENCOUNTER — Ambulatory Visit (INDEPENDENT_AMBULATORY_CARE_PROVIDER_SITE_OTHER): Payer: BLUE CROSS/BLUE SHIELD | Admitting: Physician Assistant

## 2016-11-11 ENCOUNTER — Telehealth: Payer: Self-pay

## 2016-11-11 ENCOUNTER — Encounter: Payer: Self-pay | Admitting: Physician Assistant

## 2016-11-11 VITALS — BP 114/70 | HR 96 | Temp 98.1°F | Resp 16 | Wt 229.0 lb

## 2016-11-11 DIAGNOSIS — Z1231 Encounter for screening mammogram for malignant neoplasm of breast: Secondary | ICD-10-CM

## 2016-11-11 DIAGNOSIS — W228XXA Striking against or struck by other objects, initial encounter: Secondary | ICD-10-CM | POA: Insufficient documentation

## 2016-11-11 DIAGNOSIS — G479 Sleep disorder, unspecified: Secondary | ICD-10-CM | POA: Diagnosis not present

## 2016-11-11 DIAGNOSIS — E1165 Type 2 diabetes mellitus with hyperglycemia: Secondary | ICD-10-CM | POA: Diagnosis not present

## 2016-11-11 DIAGNOSIS — Z1239 Encounter for other screening for malignant neoplasm of breast: Secondary | ICD-10-CM

## 2016-11-11 DIAGNOSIS — E1142 Type 2 diabetes mellitus with diabetic polyneuropathy: Secondary | ICD-10-CM

## 2016-11-11 DIAGNOSIS — Z23 Encounter for immunization: Secondary | ICD-10-CM | POA: Diagnosis not present

## 2016-11-11 DIAGNOSIS — IMO0002 Reserved for concepts with insufficient information to code with codable children: Secondary | ICD-10-CM

## 2016-11-11 DIAGNOSIS — M7989 Other specified soft tissue disorders: Secondary | ICD-10-CM | POA: Diagnosis not present

## 2016-11-11 DIAGNOSIS — S99922A Unspecified injury of left foot, initial encounter: Secondary | ICD-10-CM | POA: Diagnosis not present

## 2016-11-11 DIAGNOSIS — M79672 Pain in left foot: Secondary | ICD-10-CM | POA: Diagnosis not present

## 2016-11-11 LAB — POCT GLYCOSYLATED HEMOGLOBIN (HGB A1C)
Est. average glucose Bld gHb Est-mCnc: 275
Hemoglobin A1C: 11.2

## 2016-11-11 MED ORDER — GLIPIZIDE 10 MG PO TABS: 5.0000 mg | ORAL_TABLET | Freq: Two times a day (BID) | ORAL | 5 refills | Status: DC

## 2016-11-11 MED ORDER — CLONAZEPAM 0.5 MG PO TABS: 0.5000 mg | ORAL_TABLET | Freq: Every day | ORAL | 5 refills | Status: DC

## 2016-11-11 NOTE — Progress Notes (Signed)
Patient: Gabriella Marsh Female    DOB: 1966-09-27   50 y.o.   MRN: [id number] Visit Date: 11/11/2016  Today's Provider: Mar Daring, PA-C   Chief Complaint  Patient presents with  . Diabetes   Subjective:    HPI  Diabetes Mellitus Type II, Follow-up:   Lab Results  Component Value Date   HGBA1C 11.2 11/11/2016   HGBA1C 11.2 08/12/2016   HGBA1C 13.4 07/09/2016    Last seen for diabetes 3 months ago.  Management since then includes add Tanzeum. She reports excellent compliance with treatment.Patient does report that she has not had her glipizide for the last week and does take Tanzeum but has had some difficulties with taking it of recent. She is not having side effects.  Current symptoms include neuropathy in toes bilaterally. Home blood sugar records: trend: increasing steadily  Episodes of hypoglycemia? no   Current Insulin Regimen: as directed Most Recent Eye Exam:  Weight trend: stable Prior visit with dietician: no Current diet: in general, a "healthy" diet   Current exercise: none  Pertinent Labs:    Component Value Date/Time   CHOL 167 08/28/2015 1030   TRIG 205 (H) 08/28/2015 1030   HDL 50 08/28/2015 1030   LDLCALC 76 08/28/2015 1030   CREATININE 0.59 06/28/2015 0700    Wt Readings from Last 3 Encounters:  11/11/16 229 lb (103.9 kg)  09/30/16 229 lb (103.9 kg)  08/12/16 228 lb 6.4 oz (103.6 kg)   ------------------------------------------------------------------------   Patient c/o left middle toe injury on Saturday. Patient reports pain and bruised.She hit her toe on a toolbox. Patient has been able to weight-bear without difficulty. She does report increased swelling and discoloration of the left third toe.     Allergies  Allergen Reactions  . Pregabalin Shortness Of Breath     Current Outpatient Prescriptions:  .  atorvastatin (LIPITOR) 20 MG tablet, Take 1 tablet (20 mg total) by mouth daily., Disp: 90 tablet, Rfl: 3 .   clonazePAM (KLONOPIN) 0.5 MG tablet, Take 1 tablet (0.5 mg total) by mouth at bedtime., Disp: 30 tablet, Rfl: 5 .  gabapentin (NEURONTIN) 100 MG capsule, TAKE 2 CAPSULES THREE TIMES A DAY, ALONG WITH 600 MG THREE TIMES A DAY FOR A TOTAL DAILY DOSE OF 2400 MG DAILY, Disp: 180 capsule, Rfl: 5 .  gabapentin (NEURONTIN) 600 MG tablet, Take 1 tablet (600 mg total) by mouth 3 (three) times daily., Disp: 270 tablet, Rfl: 3 .  glipiZIDE (GLUCOTROL) 10 MG tablet, Take 0.5 tablets (5 mg total) by mouth 2 (two) times daily before a meal., Disp: 60 tablet, Rfl: 5 .  glucose blood test strip, Check blood sugars twice daily. OneTouch Verio strips., Disp: 200 each, Rfl: 5 .  Insulin Glargine (BASAGLAR KWIKPEN) 100 UNIT/ML Solostar Pen, Inject 15 Units into the skin daily at 10 pm., Disp: 15 mL, Rfl: 2 .  Insulin Pen Needle (CAREFINE PEN NEEDLES) 32G X 4 MM MISC, Use 1x a day, Disp: 50 each, Rfl: 11 .  metFORMIN (GLUCOPHAGE-XR) 500 MG 24 hr tablet, TAKE 2 TABLETS TWICE A DAY WITH MEALS., Disp: 360 tablet, Rfl: 1 .  Multiple Vitamin (MULTI-VITAMINS) TABS, Take by mouth., Disp: , Rfl:  .  nortriptyline (PAMELOR) 25 MG capsule, TAKE 2 CAPSULES AT BEDTIME, Disp: 180 capsule, Rfl: 1 .  oxyCODONE-acetaminophen (PERCOCET/ROXICET) 5-325 MG tablet, Take 1 tablet by mouth every 6 (six) hours as needed for severe pain., Disp: 60 tablet, Rfl: 0 .  TANZEUM 50 MG PEN, ADMINISTER 50 MG UNDER THE SKIN WEEKLY, Disp: 1 each, Rfl: 5 .  venlafaxine (EFFEXOR) 75 MG tablet, Take 1 tablet (75 mg total) by mouth 2 (two) times daily. (Patient taking differently: Take 150 mg by mouth at bedtime. 75 mg in the am, 150 mg qhs), Disp: 180 tablet, Rfl: 1  Review of Systems  Constitutional: Negative.   Respiratory: Negative.   Cardiovascular: Negative.   Gastrointestinal: Negative.   Endocrine: Negative.   Musculoskeletal: Positive for arthralgias.  Skin: Negative.   Neurological: Positive for numbness.    Social History  Substance Use  Topics  . Smoking status: Former Research scientist (life sciences)  . Smokeless tobacco: Never Used  . Alcohol use Not on file   Objective:   BP 114/70 (BP Location: Left Arm, Patient Position: Sitting, Cuff Size: Large)   Pulse 96   Temp 98.1 F (36.7 C) (Oral)   Resp 16   Wt 229 lb (103.9 kg)   SpO2 97%   BMI 32.39 kg/m   Physical Exam  Constitutional: She appears well-developed and well-nourished. No distress.  Neck: Normal range of motion. Neck supple. No JVD present. No tracheal deviation present. No thyromegaly present.  Cardiovascular: Normal rate, regular rhythm and normal heart sounds.  Exam reveals no gallop and no friction rub.   No murmur heard. Pulmonary/Chest: Effort normal and breath sounds normal. No respiratory distress. She has no wheezes. She has no rales.  Musculoskeletal: She exhibits no edema.       Feet:  Lymphadenopathy:    She has no cervical adenopathy.  Skin: She is not diaphoretic.  Vitals reviewed.     Assessment & Plan:     1. Uncontrolled type 2 diabetes mellitus with peripheral neuropathy (HCC) A1c is still uncontrolled but stable at 11.2. Have refilled her glipizide for her to take twice a day, continue metformin 500 mg taking 2 tablets twice daily with meals, Tanzeum 50 mg, and Basaglar 15 units at 10 PM. I will see her back in 3 months to recheck her A1c. If A1c is still not lower with her taking all of her medications as prescribed I will increase her basaglar nsulin. - POCT glycosylated hemoglobin (Hb A1C) - glipiZIDE (GLUCOTROL) 10 MG tablet; Take 0.5 tablets (5 mg total) by mouth 2 (two) times daily before a meal.  Dispense: 60 tablet; Refill: 5  2. Difficulty sleeping Stable. Diagnosis pulled for medication refill. Continue current medical treatment plan. - clonazePAM (KLONOPIN) 0.5 MG tablet; Take 1 tablet (0.5 mg total) by mouth at bedtime.  Dispense: 30 tablet; Refill: 5  3. Breast cancer screening There is no family history of breast cancer. She does  perform regular self breast exams. Mammogram was ordered as below. Information for [facility] clinic was given to patient so she may schedule her mammogram at her convenience. - MM Digital Screening; Future  4. Injury of toe on left foot, initial encounter X-ray was ordered for the left foot to make sure there was no fracture of the left third digit. X-ray results showed no fracture. Advised patient to buddy tape the second and third toes together and continue to wear hard soled shoes until pain, swelling and discoloration decrease      Mar Daring, PA-C  [facility]

## 2016-11-11 NOTE — Telephone Encounter (Signed)
-----   Message from Mar Daring, [place] sent at 11/11/2016  1:18 PM EST ----- No fracture noted. Continue buddy taping for support until discoloration and swelling subside

## 2016-11-11 NOTE — Patient Instructions (Signed)

## 2016-11-11 NOTE — Telephone Encounter (Signed)
Patient advised as directed below.  Thanks,  -Joseline 

## 2016-11-27 ENCOUNTER — Ambulatory Visit (INDEPENDENT_AMBULATORY_CARE_PROVIDER_SITE_OTHER): Payer: BLUE CROSS/BLUE SHIELD | Admitting: Physician Assistant

## 2016-11-27 ENCOUNTER — Encounter: Payer: Self-pay | Admitting: Physician Assistant

## 2016-11-27 VITALS — BP 148/80 | HR 101 | Temp 98.1°F | Resp 16 | Wt 228.2 lb

## 2016-11-27 DIAGNOSIS — J40 Bronchitis, not specified as acute or chronic: Secondary | ICD-10-CM

## 2016-11-27 DIAGNOSIS — J0100 Acute maxillary sinusitis, unspecified: Secondary | ICD-10-CM

## 2016-11-27 MED ORDER — DOXYCYCLINE HYCLATE 100 MG PO TABS
100.0000 mg | ORAL_TABLET | Freq: Two times a day (BID) | ORAL | 0 refills | Status: DC
Start: 2016-11-27 — End: 2017-01-01

## 2016-11-27 MED ORDER — HYDROCODONE-HOMATROPINE 5-1.5 MG/5ML PO SYRP: 5.0000 mL | ORAL_SOLUTION | Freq: Three times a day (TID) | ORAL | 0 refills | Status: DC | PRN

## 2016-11-27 MED ORDER — PREDNISONE 10 MG PO TABS: ORAL_TABLET | ORAL | 0 refills | Status: DC

## 2016-11-27 NOTE — Patient Instructions (Signed)
Acute Bronchitis, Adult Acute bronchitis is sudden (acute) swelling of the air tubes (bronchi) in the lungs. Acute bronchitis causes these tubes to fill with mucus, which can make it hard to breathe. It can also cause coughing or wheezing. In adults, acute bronchitis usually goes away within 2 weeks. A cough caused by bronchitis may last up to 3 weeks. Smoking, allergies, and asthma can make the condition worse. Repeated episodes of bronchitis may cause further lung problems, such as chronic obstructive pulmonary disease (COPD). What are the causes? This condition can be caused by germs and by substances that irritate the lungs, including:  Cold and flu viruses. This condition is most often caused by the same virus that causes a cold.  Bacteria.  Exposure to tobacco smoke, dust, fumes, and air pollution. What increases the risk? This condition is more likely to develop in people who:  Have close contact with someone with acute bronchitis.  Are exposed to lung irritants, such as tobacco smoke, dust, fumes, and vapors.  Have a weak immune system.  Have a respiratory condition such as asthma. What are the signs or symptoms? Symptoms of this condition include:  A cough.  Coughing up clear, yellow, or green mucus.  Wheezing.  Chest congestion.  Shortness of breath.  A fever.  Body aches.  Chills.  A sore throat. How is this diagnosed? This condition is usually diagnosed with a physical exam. During the exam, your health care provider may order tests, such as chest X-rays, to rule out other conditions. He or she may also:  Test a sample of your mucus for bacterial infection.  Check the level of oxygen in your blood. This is done to check for pneumonia.  Do a chest X-ray or lung function testing to rule out pneumonia and other conditions.  Perform blood tests. Your health care provider will also ask about your symptoms and medical history. How is this treated? Most cases  of acute bronchitis clear up over time without treatment. Your health care provider may recommend:  Drinking more fluids. Drinking more makes your mucus thinner, which may make it easier to breathe.  Taking a medicine for a fever or cough.  Taking an antibiotic medicine.  Using an inhaler to help improve shortness of breath and to control a cough.  Using a cool mist vaporizer or humidifier to make it easier to breathe. Follow these instructions at home: Medicines  Take over-the-counter and prescription medicines only as told by your health care provider.  If you were prescribed an antibiotic, take it as told by your health care provider. Do not stop taking the antibiotic even if you start to feel better. General instructions  Get plenty of rest.  Drink enough fluids to keep your urine clear or pale yellow.  Avoid smoking and secondhand smoke. Exposure to cigarette smoke or irritating chemicals will make bronchitis worse. If you smoke and you need help quitting, ask your health care provider. Quitting smoking will help your lungs heal faster.  Use an inhaler, cool mist vaporizer, or humidifier as told by your health care provider.  Keep all follow-up visits as told by your health care provider. This is important. How is this prevented? To lower your risk of getting this condition again:  Wash your hands often with soap and water. If soap and water are not available, use hand sanitizer.  Avoid contact with people who have cold symptoms.  Try not to touch your hands to your mouth, nose, or eyes.    Make sure to get the flu shot every year. Contact a health care provider if:  Your symptoms do not improve in 2 weeks of treatment. Get help right away if:  You cough up blood.  You have chest pain.  You have severe shortness of breath.  You become dehydrated.  You faint or keep feeling like you are going to faint.  You keep vomiting.  You have a severe headache.  Your  fever or chills gets worse. This information is not intended to replace advice given to you by your health care provider. Make sure you discuss any questions you have with your health care provider. Document Released: 01/14/2005 Document Revised: 07/01/2016 Document Reviewed: 05/27/2016 Elsevier Interactive Patient Education  2017 [employer] Sinusitis, Adult Sinusitis is soreness and inflammation of your sinuses. Sinuses are hollow spaces in the bones around your face. Your sinuses are located:  Around your eyes.  In the middle of your forehead.  Behind your nose.  In your cheekbones. Your sinuses and nasal passages are lined with a stringy fluid (mucus). Mucus normally drains out of your sinuses. When your nasal tissues become inflamed or swollen, the mucus can become trapped or blocked so air cannot flow through your sinuses. This allows bacteria, viruses, and funguses to grow, which leads to infection. Sinusitis can develop quickly and last for 7?10 days (acute) or for more than 12 weeks (chronic). Sinusitis often develops after a cold. What are the causes? This condition is caused by anything that creates swelling in the sinuses or stops mucus from draining, including:  Allergies.  Asthma.  Bacterial or viral infection.  Abnormally shaped bones between the nasal passages.  Nasal growths that contain mucus (nasal polyps).  Narrow sinus openings.  Pollutants, such as chemicals or irritants in the air.  A foreign object stuck in the nose.  A fungal infection. This is rare. What increases the risk? The following factors may make you more likely to develop this condition:  Having allergies or asthma.  Having had a recent cold or respiratory tract infection.  Having structural deformities or blockages in your nose or sinuses.  Having a weak immune system.  Doing a lot of swimming or diving.  Overusing nasal sprays.  Smoking. What are the signs or symptoms? The  main symptoms of this condition are pain and a feeling of pressure around the affected sinuses. Other symptoms include:  Upper toothache.  Earache.  Headache.  Bad breath.  Decreased sense of smell and taste.  A cough that may get worse at night.  Fatigue.  Fever.  Thick drainage from your nose. The drainage is often green and it may contain pus (purulent).  Stuffy nose or congestion.  Postnasal drip. This is when extra mucus collects in the throat or back of the nose.  Swelling and warmth over the affected sinuses.  Sore throat.  Sensitivity to light. How is this diagnosed? This condition is diagnosed based on symptoms, a medical history, and a physical exam. To find out if your condition is acute or chronic, your health care provider may:  Look in your nose for signs of nasal polyps.  Tap over the affected sinus to check for signs of infection.  View the inside of your sinuses using an imaging device that has a light attached (endoscope). If your health care provider suspects that you have chronic sinusitis, you may also:  Be tested for allergies.  Have a sample of mucus taken from your nose (nasal culture) and  checked for bacteria.  Have a mucus sample examined to see if your sinusitis is related to an allergy. If your sinusitis does not respond to treatment and it lasts longer than 8 weeks, you may have an MRI or CT scan to check your sinuses. These scans also help to determine how severe your infection is. In rare cases, a bone biopsy may be done to rule out more serious types of fungal sinus disease. How is this treated? Treatment for sinusitis depends on the cause and whether your condition is chronic or acute. If a virus is causing your sinusitis, your symptoms will go away on their own within 10 days. You may be given medicines to relieve your symptoms, including:  Topical nasal decongestants. They shrink swollen nasal passages and let mucus drain from your  sinuses.  Antihistamines. These drugs block inflammation that is triggered by allergies. This can help to ease swelling in your nose and sinuses.  Topical nasal corticosteroids. These are nasal sprays that ease inflammation and swelling in your nose and sinuses.  Nasal saline washes. These rinses can help to get rid of thick mucus in your nose. If your condition is caused by bacteria, you will be given an antibiotic medicine. If your condition is caused by a fungus, you will be given an antifungal medicine. Surgery may be needed to correct underlying conditions, such as narrow nasal passages. Surgery may also be needed to remove polyps. Follow these instructions at home: Medicines  Take, use, or apply over-the-counter and prescription medicines only as told by your health care provider. These may include nasal sprays.  If you were prescribed an antibiotic medicine, take it as told by your health care provider. Do not stop taking the antibiotic even if you start to feel better. Hydrate and Humidify  Drink enough water to keep your urine clear or pale yellow. Staying hydrated will help to thin your mucus.  Use a cool mist humidifier to keep the humidity level in your home above 50%.  Inhale steam for 10-15 minutes, 3-4 times a day or as told by your health care provider. You can do this in the bathroom while a hot shower is running.  Limit your exposure to cool or dry air. Rest  Rest as much as possible.  Sleep with your head raised (elevated).  Make sure to get enough sleep each night. General instructions  Apply a warm, moist washcloth to your face 3-4 times a day or as told by your health care provider. This will help with discomfort.  Wash your hands often with soap and water to reduce your exposure to viruses and other germs. If soap and water are not available, use hand sanitizer.  Do not smoke. Avoid being around people who are smoking (secondhand smoke).  Keep all  follow-up visits as told by your health care provider. This is important. Contact a health care provider if:  You have a fever.  Your symptoms get worse.  Your symptoms do not improve within 10 days. Get help right away if:  You have a severe headache.  You have persistent vomiting.  You have pain or swelling around your face or eyes.  You have vision problems.  You develop confusion.  Your neck is stiff.  You have trouble breathing. This information is not intended to replace advice given to you by your health care provider. Make sure you discuss any questions you have with your health care provider. Document Released: 12/07/2005 Document Revised: 08/02/2016 Document Reviewed:  10/02/2015 Elsevier Interactive Patient Education  2017 [employer].

## 2016-11-27 NOTE — Progress Notes (Signed)
Patient: Gabriella Marsh Female    DOB: 1966/01/04   50 y.o.   MRN: [id number] Visit Date: 11/27/2016  Today's Provider: Mar Daring, PA-C   Chief Complaint  Patient presents with  . URI   Subjective:    URI   This is a new problem. The current episode started 1 to 4 weeks ago. The problem has been gradually worsening. There has been no fever. Associated symptoms include congestion, coughing, ear pain (Right ear), headaches, rhinorrhea, sinus pain, a sore throat and wheezing. Pertinent negatives include no abdominal pain, chest pain, nausea or sneezing. She has tried decongestant (Tylenol Cold) for the symptoms. The treatment provided no relief.      Allergies  Allergen Reactions  . Pregabalin Shortness Of Breath     Current Outpatient Prescriptions:  .  atorvastatin (LIPITOR) 20 MG tablet, Take 1 tablet (20 mg total) by mouth daily., Disp: 90 tablet, Rfl: 3 .  clonazePAM (KLONOPIN) 0.5 MG tablet, Take 1 tablet (0.5 mg total) by mouth at bedtime., Disp: 30 tablet, Rfl: 5 .  gabapentin (NEURONTIN) 100 MG capsule, TAKE 2 CAPSULES THREE TIMES A DAY, ALONG WITH 600 MG THREE TIMES A DAY FOR A TOTAL DAILY DOSE OF 2400 MG DAILY, Disp: 180 capsule, Rfl: 5 .  gabapentin (NEURONTIN) 600 MG tablet, Take 1 tablet (600 mg total) by mouth 3 (three) times daily., Disp: 270 tablet, Rfl: 3 .  glipiZIDE (GLUCOTROL) 10 MG tablet, Take 0.5 tablets (5 mg total) by mouth 2 (two) times daily before a meal., Disp: 60 tablet, Rfl: 5 .  glucose blood test strip, Check blood sugars twice daily. OneTouch Verio strips., Disp: 200 each, Rfl: 5 .  Insulin Glargine (BASAGLAR KWIKPEN) 100 UNIT/ML Solostar Pen, Inject 15 Units into the skin daily at 10 pm., Disp: 15 mL, Rfl: 2 .  Insulin Pen Needle (CAREFINE PEN NEEDLES) 32G X 4 MM MISC, Use 1x a day, Disp: 50 each, Rfl: 11 .  metFORMIN (GLUCOPHAGE-XR) 500 MG 24 hr tablet, TAKE 2 TABLETS TWICE A DAY WITH MEALS., Disp: 360 tablet, Rfl: 1 .  Multiple  Vitamin (MULTI-VITAMINS) TABS, Take by mouth., Disp: , Rfl:  .  nortriptyline (PAMELOR) 25 MG capsule, TAKE 2 CAPSULES AT BEDTIME, Disp: 180 capsule, Rfl: 1 .  oxyCODONE-acetaminophen (PERCOCET/ROXICET) 5-325 MG tablet, Take 1 tablet by mouth every 6 (six) hours as needed for severe pain., Disp: 60 tablet, Rfl: 0 .  TANZEUM 50 MG PEN, ADMINISTER 50 MG UNDER THE SKIN WEEKLY, Disp: 1 each, Rfl: 5 .  venlafaxine (EFFEXOR) 75 MG tablet, Take 1 tablet (75 mg total) by mouth 2 (two) times daily. (Patient taking differently: Take 150 mg by mouth at bedtime. 75 mg in the am, 150 mg qhs), Disp: 180 tablet, Rfl: 1  Review of Systems  Constitutional: Positive for fatigue. Negative for chills and fever.  HENT: Positive for congestion, ear pain (Right ear), postnasal drip, rhinorrhea, sinus pain, sinus pressure, sore throat and tinnitus (right ear). Negative for sneezing and trouble swallowing.   Respiratory: Positive for cough, chest tightness, shortness of breath and wheezing.   Cardiovascular: Negative for chest pain, palpitations and leg swelling.  Gastrointestinal: Negative for abdominal pain and nausea.  Neurological: Positive for headaches. Negative for dizziness.    Social History  Substance Use Topics  . Smoking status: Former Research scientist (life sciences)  . Smokeless tobacco: Never Used  . Alcohol use Not on file   Objective:   BP (!) 148/80 (BP Location: Left  Arm, Patient Position: Sitting, Cuff Size: Normal)   Pulse (!) 101   Temp 98.1 F (36.7 C) (Oral)   Resp 16   Wt 228 lb 3.2 oz (103.5 kg)   SpO2 98%   BMI 32.28 kg/m   Physical Exam  Constitutional: She appears well-developed and well-nourished. No distress.  HENT:  Head: Normocephalic and atraumatic.  Right Ear: Hearing, external ear and ear canal normal. Tympanic membrane is not erythematous and not bulging. A middle ear effusion is present.  Left Ear: Hearing, tympanic membrane, external ear and ear canal normal.  Nose: Mucosal edema and  rhinorrhea present. Right sinus exhibits maxillary sinus tenderness and frontal sinus tenderness. Left sinus exhibits maxillary sinus tenderness and frontal sinus tenderness.  Mouth/Throat: Uvula is midline, oropharynx is clear and moist and mucous membranes are normal. No oropharyngeal exudate, posterior oropharyngeal edema or posterior oropharyngeal erythema (thick mucous drainage noted).  Neck: Normal range of motion. Neck supple. No tracheal deviation present. No thyromegaly present.  Cardiovascular: Normal rate, regular rhythm and normal heart sounds.  Exam reveals no gallop and no friction rub.   No murmur heard. Pulmonary/Chest: Effort normal. No stridor. No respiratory distress. She has decreased breath sounds. She has wheezes in the right lower field, the left middle field and the left lower field. She has no rhonchi. She has no rales.  Lymphadenopathy:    She has no cervical adenopathy.  Skin: She is not diaphoretic.  Vitals reviewed.      Assessment & Plan:     1. Acute maxillary sinusitis, recurrence not specified Worsening symptoms that have not responded to OTC medications. Will give doxycycline as below. Continue allergy medications. Stay well hydrated and get plenty of rest. Call if no symptom improvement or if symptoms worsen. - doxycycline (VIBRA-TABS) 100 MG tablet; Take 1 tablet (100 mg total) by mouth 2 (two) times daily.  Dispense: 20 tablet; Refill: 0  2. Bronchitis Worsening. Will give doxycycline and prednisone as below. She is to closely monitor sugar levels while on steroid. She is to call if sugars get above 400 or if SOB worsens. Hycodan cough syrup also given for nighttime cough.  - predniSONE (DELTASONE) 10 MG tablet; Take 6 tabs PO on day 1&2, 5 tabs PO on day 3&4, 4 tabs PO on day 5&6, 3 tabs PO on day 7&8, 2 tabs PO on day 9&10, 1 tab PO on day 11&12.  Dispense: 42 tablet; Refill: 0 - HYDROcodone-homatropine (HYCODAN) 5-1.5 MG/5ML syrup; Take 5 mLs by mouth  every 8 (eight) hours as needed.  Dispense: 180 mL; Refill: 0       Mar Daring, PA-C  [facility]

## 2016-12-07 ENCOUNTER — Other Ambulatory Visit: Payer: Self-pay | Admitting: Family Medicine

## 2016-12-07 ENCOUNTER — Other Ambulatory Visit: Payer: Self-pay | Admitting: Internal Medicine

## 2016-12-07 DIAGNOSIS — E1142 Type 2 diabetes mellitus with diabetic polyneuropathy: Secondary | ICD-10-CM

## 2016-12-07 DIAGNOSIS — E7800 Pure hypercholesterolemia, unspecified: Secondary | ICD-10-CM

## 2016-12-07 DIAGNOSIS — IMO0002 Reserved for concepts with insufficient information to code with codable children: Secondary | ICD-10-CM

## 2016-12-07 DIAGNOSIS — E1165 Type 2 diabetes mellitus with hyperglycemia: Principal | ICD-10-CM

## 2016-12-17 ENCOUNTER — Other Ambulatory Visit: Payer: Self-pay | Admitting: Physician Assistant

## 2016-12-17 MED ORDER — OXYCODONE-ACETAMINOPHEN 5-325 MG PO TABS: 1.0000 | ORAL_TABLET | Freq: Four times a day (QID) | ORAL | 0 refills | Status: DC | PRN

## 2016-12-17 NOTE — Telephone Encounter (Signed)
Pt needs refill  oxyCODONE-acetaminophen (PERCOCET/ROXICET) 5-325 MG tablet  Thanks Con Memos

## 2016-12-17 NOTE — Telephone Encounter (Signed)
Pt advised.sd

## 2016-12-18 ENCOUNTER — Other Ambulatory Visit: Payer: Self-pay | Admitting: Family Medicine

## 2016-12-18 DIAGNOSIS — G2581 Restless legs syndrome: Secondary | ICD-10-CM

## 2016-12-18 NOTE — Telephone Encounter (Signed)
Last ov 11/27/16 last filled 06/01/16. Please review. Thank you. sd

## 2016-12-22 ENCOUNTER — Emergency Department: Payer: BLUE CROSS/BLUE SHIELD

## 2016-12-22 ENCOUNTER — Emergency Department
Admission: EM | Admit: 2016-12-22 | Discharge: 2016-12-22 | Disposition: A | Discharge status: Home / Self Care | Payer: BLUE CROSS/BLUE SHIELD | Attending: Emergency Medicine | Admitting: Emergency Medicine

## 2016-12-22 ENCOUNTER — Encounter: Payer: Self-pay | Admitting: Emergency Medicine

## 2016-12-22 DIAGNOSIS — Z794 Long term (current) use of insulin: Secondary | ICD-10-CM | POA: Diagnosis not present

## 2016-12-22 DIAGNOSIS — R05 Cough: Secondary | ICD-10-CM | POA: Diagnosis not present

## 2016-12-22 DIAGNOSIS — E119 Type 2 diabetes mellitus without complications: Secondary | ICD-10-CM | POA: Insufficient documentation

## 2016-12-22 DIAGNOSIS — I10 Essential (primary) hypertension: Secondary | ICD-10-CM | POA: Diagnosis not present

## 2016-12-22 DIAGNOSIS — J0100 Acute maxillary sinusitis, unspecified: Secondary | ICD-10-CM | POA: Diagnosis not present

## 2016-12-22 DIAGNOSIS — Z79899 Other long term (current) drug therapy: Secondary | ICD-10-CM | POA: Insufficient documentation

## 2016-12-22 DIAGNOSIS — R059 Cough, unspecified: Secondary | ICD-10-CM

## 2016-12-22 DIAGNOSIS — Z87891 Personal history of nicotine dependence: Secondary | ICD-10-CM | POA: Diagnosis not present

## 2016-12-22 MED ORDER — FEXOFENADINE-PSEUDOEPHED ER 60-120 MG PO TB12
1.0000 | ORAL_TABLET | Freq: Two times a day (BID) | ORAL | 0 refills | Status: DC
Start: 2016-12-22 — End: 2017-02-11

## 2016-12-22 MED ORDER — AMOXICILLIN-POT CLAVULANATE 875-125 MG PO TABS: 1.0000 | ORAL_TABLET | Freq: Two times a day (BID) | ORAL | 0 refills | Status: DC

## 2016-12-22 NOTE — Discharge Instructions (Signed)
cough is caused by postnasal drainage. May continue taking cough medicine until the antibiotics and Allegra-D started working.

## 2016-12-22 NOTE — ED Triage Notes (Signed)
Pt to ed with c/o cough and congestion for several weeks,  Pt states she was seen at [facility] 2 weeks ago for the same, was on abx and steroids and felt better briefly, however felt worse yesterday.

## 2016-12-22 NOTE — ED Notes (Signed)
See triage note.  Cough and congestion for several weeks . Was seen by pcp  Placed on doxy and steroids  But has not finished the meds   The sx's became worse over the past few days   No fever cough is prod  Clear phlegm  But not having some increased discomfort in chest with cough and resp

## 2016-12-22 NOTE — ED Provider Notes (Signed)
[facility] Emergency Department Provider Note   ____________________________________________   First MD Initiated Contact with Patient 12/22/16 [phone]     (approximate)  I have reviewed the triage vital signs and the nursing notes.   HISTORY  Chief Complaint Cough    HPI Gabriella Marsh is a 51 y.o. female patient complaining of cough congestion several weeks. Patient states saw her PCP 2 weeks ago given steroids and antibiotics and improved by the next day. Patient state on the second day of the steroids and antibiotics she had nausea and she discontinued the medications. Patient states they went to visit family members for 1 week and everybody was sick. Patient states she did not feel sick to she returned back home. Patient states she's continue to have increased cough which is nonproductive and nasal congestion..  Past Medical History:  Diagnosis Date  . Diabetes mellitus without complication ([place])   . Hyperlipidemia   . Hypertension     Patient Active Problem List   Diagnosis Date Noted  . Benign positional vertigo 02/03/2016  . Uncontrolled type 2 diabetes mellitus with peripheral neuropathy ([place]) 07/22/2015  . Common migraine with intractable migraine 06/18/2015  . Anxiety 05/04/2015  . Adaptation reaction 05/03/2015  . DDD (degenerative disc disease), lumbosacral 05/03/2015  . Hypercholesteremia 05/03/2015  . Insomnia due to medical condition 05/03/2015  . Adiposity 05/03/2015  . Disorder of peripheral nervous system ([place]) 05/03/2015  . Snores 05/03/2015  . Diabetic neuropathy ([place]) 03/06/2015  . Essential hypertension 03/06/2015  . Elevated liver function tests 03/06/2015  . Obesity 03/06/2015  . GERD (gastroesophageal reflux disease) 03/06/2015  . Multinodular goiter 03/06/2015  . RLS (restless legs syndrome) 03/06/2015  . Leg cramps 03/06/2015    Past Surgical History:  Procedure Laterality Date  . ABDOMINAL HYSTERECTOMY     But  still has cervix  . CESAREAN SECTION    . CHOLECYSTECTOMY    . OVARIAN CYST SURGERY    . SHOULDER SURGERY Left   . TONSILLECTOMY AND ADENOIDECTOMY    . UTERINE FIBROID SURGERY      Prior to Admission medications   Medication Sig Start Date End Date Taking? Authorizing Provider  amoxicillin-clavulanate (AUGMENTIN) 875-125 MG tablet Take 1 tablet by mouth every 12 (twelve) hours. 12/22/16 01/01/17  Sable Feil, PA-C  atorvastatin (LIPITOR) 20 MG tablet TAKE 1 TABLET DAILY 12/08/16   Philemon Kingdom, MD  clonazePAM (KLONOPIN) 0.5 MG tablet Take 1 tablet (0.5 mg total) by mouth at bedtime. 11/11/16   Mar Daring, PA-C  doxycycline (VIBRA-TABS) 100 MG tablet Take 1 tablet (100 mg total) by mouth 2 (two) times daily. 11/27/16   Mar Daring, PA-C  fexofenadine-pseudoephedrine (ALLEGRA-D) 60-120 MG 12 hr tablet Take 1 tablet by mouth 2 (two) times daily. 12/22/16   Sable Feil, PA-C  gabapentin (NEURONTIN) 100 MG capsule TAKE 2 CAPSULES THREE TIMES A DAY, ALONG WITH 600 MG THREE TIMES A DAY FOR A TOTAL DAILY DOSE OF 2400 MG DAILY 10/04/16   Mar Daring, PA-C  gabapentin (NEURONTIN) 600 MG tablet Take 1 tablet (600 mg total) by mouth 3 (three) times daily. 02/11/16   Margarita Rana, MD  glipiZIDE (GLUCOTROL) 10 MG tablet Take 0.5 tablets (5 mg total) by mouth 2 (two) times daily before a meal. 11/11/16   Mar Daring, PA-C  glucose blood test strip Check blood sugars twice daily. OneTouch Verio strips. 03/06/15   Haydee Monica, MD  HYDROcodone-homatropine (HYCODAN) 5-1.5 MG/5ML syrup  Take 5 mLs by mouth every 8 (eight) hours as needed. 11/27/16   Mar Daring, PA-C  Insulin Glargine (BASAGLAR KWIKPEN) 100 UNIT/ML Solostar Pen Inject 15 Units into the skin daily at 10 pm. 02/10/16   Philemon Kingdom, MD  Insulin Pen Needle (CAREFINE PEN NEEDLES) 32G X 4 MM MISC Use 1x a day 02/10/16   Philemon Kingdom, MD  metFORMIN (GLUCOPHAGE-XR) 500 MG 24 hr tablet TAKE 2 TABLETS  TWICE A DAY WITH MEALS. 12/07/16   Mar Daring, PA-C  Multiple Vitamin (MULTI-VITAMINS) TABS Take by mouth.    Historical Provider, MD  nortriptyline (PAMELOR) 25 MG capsule TAKE 2 CAPSULES AT BEDTIME 12/18/16   Mar Daring, PA-C  oxyCODONE-acetaminophen (PERCOCET/ROXICET) 5-325 MG tablet Take 1 tablet by mouth every 6 (six) hours as needed for severe pain. 12/17/16   Mar Daring, PA-C  predniSONE (DELTASONE) 10 MG tablet Take 6 tabs PO on day 1&2, 5 tabs PO on day 3&4, 4 tabs PO on day 5&6, 3 tabs PO on day 7&8, 2 tabs PO on day 9&10, 1 tab PO on day 11&12. 11/27/16   Mar Daring, PA-C  TANZEUM 50 MG PEN ADMINISTER 50 MG UNDER THE SKIN WEEKLY 09/08/16   Mar Daring, PA-C  venlafaxine (EFFEXOR) 75 MG tablet Take 1 tablet (75 mg total) by mouth 2 (two) times daily. Patient taking differently: Take 150 mg by mouth at bedtime. 75 mg in the am, 150 mg qhs 11/19/15   Margarita Rana, MD    Allergies Pregabalin  Family History  Problem Relation Age of Onset  . Diabetes Father   . Heart disease Father   . Hypertension Father   . Hyperlipidemia Father   . Congestive Heart Failure Father   . Healthy Brother   . Osteoporosis Mother   . Irritable bowel syndrome Mother   . Osteoporosis Maternal Grandmother     Social History Social History  Substance Use Topics  . Smoking status: Former Research scientist (life sciences)  . Smokeless tobacco: Never Used  . Alcohol use Not on file    Review of Systems  Constitutional: No fever/chills Eyes: No visual changes. ENT: No sore throat. Cardiovascular: Denies chest pain. Respiratory: Denies shortness of breath. Gastrointestinal: No abdominal pain.  No nausea, no vomiting.  No diarrhea.  No constipation. Genitourinary: Negative for dysuria. Musculoskeletal: Negative for back pain. Skin: Negative for rash. Neurological: Negative for headaches, focal weakness or numbness. Psychiatric:Anxiety Endocrine:Hypertension, hyperlipidemia,  diabetes.  ____________________________________________   PHYSICAL EXAM:  VITAL SIGNS: ED Triage Vitals  Enc Vitals Group     BP 12/22/16 0737 (!) 146/71     Pulse Rate 12/22/16 0737 (!) 109     Resp 12/22/16 0737 20     Temp 12/22/16 0737 98 F (36.7 C)     Temp Source 12/22/16 0737 Oral     SpO2 12/22/16 0737 97 %     Weight 12/22/16 0738 228 lb (103.4 kg)     Height --      Head Circumference --      Peak Flow --      Pain Score 12/22/16 0738 7     Pain Loc --      Pain Edu? --      Excl. in [place]? --     Constitutional: Alert and oriented. Well appearing and in no acute distress. Eyes: Conjunctivae are normal. PERRL. EOMI. Head: Atraumatic. Nose: Edematous nasal turbinates with thick yellow wrist rhinorrhea. Bilateral maxillary guarding with palpation. Mouth/Throat: Mucous  membranes are moist.  Oropharynx non-erythematous. Postnasal drainage Neck: No stridor.  No cervical spine tenderness to palpation. Hematological/Lymphatic/Immunilogical: No cervical lymphadenopathy. Cardiovascular: Normal rate, regular rhythm. Grossly normal heart sounds.  Good peripheral circulation. Respiratory: Normal respiratory effort.  No retractions. Lungs clear to auscultation.  Gastrointestinal: Soft and nontender. No distention. No abdominal bruits. No CVA tenderness. Musculoskeletal: No lower extremity tenderness nor edema.  No joint effusions. Neurologic:  Normal speech and language. No gross focal neurologic deficits are appreciated. No gait instability. Skin:  Skin is warm, dry and intact. No rash noted. Psychiatric: Mood and affect are normal. Speech and behavior are normal.  ____________________________________________   LABS (all labs ordered are listed, but only abnormal results are displayed)  Labs Reviewed - No data to  display ____________________________________________  EKG   ____________________________________________  RADIOLOGY   ____________________________________________   PROCEDURES  Procedure(s) performed: None  Procedures  Critical Care performed: No  ____________________________________________   INITIAL IMPRESSION / ASSESSMENT AND PLAN / ED COURSE  Pertinent labs & imaging results that were available during my care of the patient were reviewed by me and considered in my medical decision making (see chart for details).  Maxillary sinusitis with cough secondary to postnasal drainage. Patient given discharge care instructions. Patient given prescription for Augmentin and Allegra-D. Patient may continue using cough medicine as needed.  Clinical Course    Patient with cough and nasal and chest congestion 2 weeks. Discussed checks x-ray finding with the patient which were unremarkable.   FINAL CLINICAL IMPRESSION(S) / ED DIAGNOSES  Final diagnoses:  Subacute maxillary sinusitis  Cough      NEW MEDICATIONS STARTED DURING THIS VISIT:  New Prescriptions   AMOXICILLIN-CLAVULANATE (AUGMENTIN) 875-125 MG TABLET    Take 1 tablet by mouth every 12 (twelve) hours.   FEXOFENADINE-PSEUDOEPHEDRINE (ALLEGRA-D) 60-120 MG 12 HR TABLET    Take 1 tablet by mouth 2 (two) times daily.     Note:  This document was prepared using Dragon voice recognition software and may include unintentional dictation errors.    Sable Feil, PA-C 12/22/16 [id number]    Earleen Newport, MD 12/22/16 1048

## 2017-01-01 ENCOUNTER — Ambulatory Visit (INDEPENDENT_AMBULATORY_CARE_PROVIDER_SITE_OTHER): Payer: BLUE CROSS/BLUE SHIELD | Admitting: Physician Assistant

## 2017-01-01 ENCOUNTER — Encounter: Payer: Self-pay | Admitting: Physician Assistant

## 2017-01-01 VITALS — BP 104/76 | HR 86 | Temp 97.7°F | Resp 16 | Wt 225.0 lb

## 2017-01-01 DIAGNOSIS — J40 Bronchitis, not specified as acute or chronic: Secondary | ICD-10-CM

## 2017-01-01 MED ORDER — HYDROCODONE-HOMATROPINE 5-1.5 MG/5ML PO SYRP: 5.0000 mL | ORAL_SOLUTION | Freq: Three times a day (TID) | ORAL | 0 refills | Status: DC | PRN

## 2017-01-01 MED ORDER — LEVOFLOXACIN 500 MG PO TABS: 500.0000 mg | ORAL_TABLET | Freq: Every day | ORAL | 0 refills | Status: DC

## 2017-01-01 NOTE — Progress Notes (Signed)
Patient: Gabriella Marsh Female    DOB: [birth year]   51 y.o.   MRN: [id number] Visit Date: 01/01/2017  Today's Provider: Mar Daring, PA-C   Chief Complaint  Patient presents with  . URI   Subjective:    HPI Upper Respiratory Infection: Patient complains of symptoms of a URI. Symptoms include congestion and cough. Onset of symptoms was several weeks ago, unchanged since that time. She also c/o nasal congestion and productive cough with  clear colored sputum for the past 1 week .  She is drinking plenty of fluids. Evaluation to date: 11/27/16 and was started on doxy, hycodan, and prednisone. Patient was then seen at [facility] ER on 12/22/16 and started on Augmentin and Allegra D Treatment to date: antibiotics, antihistamines, cough suppressants and decongestants.      Allergies  Allergen Reactions  . Pregabalin Shortness Of Breath     Current Outpatient Prescriptions:  .  atorvastatin (LIPITOR) 20 MG tablet, TAKE 1 TABLET DAILY, Disp: 90 tablet, Rfl: 3 .  clonazePAM (KLONOPIN) 0.5 MG tablet, Take 1 tablet (0.5 mg total) by mouth at bedtime., Disp: 30 tablet, Rfl: 5 .  fexofenadine-pseudoephedrine (ALLEGRA-D) 60-120 MG 12 hr tablet, Take 1 tablet by mouth 2 (two) times daily., Disp: 20 tablet, Rfl: 0 .  gabapentin (NEURONTIN) 100 MG capsule, TAKE 2 CAPSULES THREE TIMES A DAY, ALONG WITH 600 MG THREE TIMES A DAY FOR A TOTAL DAILY DOSE OF 2400 MG DAILY, Disp: 180 capsule, Rfl: 5 .  gabapentin (NEURONTIN) 600 MG tablet, Take 1 tablet (600 mg total) by mouth 3 (three) times daily., Disp: 270 tablet, Rfl: 3 .  glipiZIDE (GLUCOTROL) 10 MG tablet, Take 0.5 tablets (5 mg total) by mouth 2 (two) times daily before a meal., Disp: 60 tablet, Rfl: 5 .  glucose blood test strip, Check blood sugars twice daily. OneTouch Verio strips., Disp: 200 each, Rfl: 5 .  HYDROcodone-homatropine (HYCODAN) 5-1.5 MG/5ML syrup, Take 5 mLs by mouth every 8 (eight) hours as needed., Disp: 180 mL, Rfl: 0 .   Insulin Pen Needle (CAREFINE PEN NEEDLES) 32G X 4 MM MISC, Use 1x a day, Disp: 50 each, Rfl: 11 .  metFORMIN (GLUCOPHAGE-XR) 500 MG 24 hr tablet, TAKE 2 TABLETS TWICE A DAY WITH MEALS., Disp: 360 tablet, Rfl: 1 .  Multiple Vitamin (MULTI-VITAMINS) TABS, Take by mouth., Disp: , Rfl:  .  nortriptyline (PAMELOR) 25 MG capsule, TAKE 2 CAPSULES AT BEDTIME, Disp: 180 capsule, Rfl: 1 .  oxyCODONE-acetaminophen (PERCOCET/ROXICET) 5-325 MG tablet, Take 1 tablet by mouth every 6 (six) hours as needed for severe pain., Disp: 60 tablet, Rfl: 0 .  TANZEUM 50 MG PEN, ADMINISTER 50 MG UNDER THE SKIN WEEKLY, Disp: 1 each, Rfl: 5 .  venlafaxine (EFFEXOR) 75 MG tablet, Take 1 tablet (75 mg total) by mouth 2 (two) times daily. (Patient taking differently: Take 150 mg by mouth at bedtime. 75 mg in the am, 150 mg qhs), Disp: 180 tablet, Rfl: 1 .  Insulin Glargine (BASAGLAR KWIKPEN) 100 UNIT/ML Solostar Pen, Inject 15 Units into the skin daily at 10 pm. (Patient not taking: Reported on 01/01/2017), Disp: 15 mL, Rfl: 2  Review of Systems  Constitutional: Positive for appetite change and fatigue. Negative for fever.  HENT: Positive for congestion, postnasal drip, rhinorrhea, sinus pain, sinus pressure and sneezing. Negative for ear pain, sore throat and trouble swallowing.   Respiratory: Positive for cough, chest tightness, shortness of breath and wheezing.   Cardiovascular: Negative  for chest pain, palpitations and leg swelling.  Gastrointestinal: Negative for abdominal pain, nausea and vomiting.  Neurological: Negative for dizziness and headaches.    Social History  Substance Use Topics  . Smoking status: Former Research scientist (life sciences)  . Smokeless tobacco: Never Used  . Alcohol use Not on file   Objective:   BP 104/76 (BP Location: Left Arm, Patient Position: Sitting, Cuff Size: Large)   Pulse 86   Temp 97.7 F (36.5 C) (Oral)   Resp 16   Wt 225 lb (102.1 kg)   SpO2 98%   BMI 31.83 kg/m   Physical Exam  Constitutional:  She appears well-developed and well-nourished. No distress.  HENT:  Head: Normocephalic and atraumatic.  Right Ear: Hearing, tympanic membrane, external ear and ear canal normal.  Left Ear: Hearing, tympanic membrane, external ear and ear canal normal.  Nose: Mucosal edema and rhinorrhea present. Right sinus exhibits no maxillary sinus tenderness and no frontal sinus tenderness. Left sinus exhibits no maxillary sinus tenderness and no frontal sinus tenderness.  Mouth/Throat: Uvula is midline and mucous membranes are normal. Posterior oropharyngeal erythema present. No oropharyngeal exudate or posterior oropharyngeal edema.  Eyes: Conjunctivae are normal. Pupils are equal, round, and reactive to light. Right eye exhibits no discharge. Left eye exhibits no discharge. No scleral icterus.  Neck: Normal range of motion. Neck supple. No tracheal deviation present. No thyromegaly present.  Cardiovascular: Normal rate, regular rhythm and normal heart sounds.  Exam reveals no gallop and no friction rub.   No murmur heard. Pulmonary/Chest: Effort normal. No stridor. No respiratory distress. She has no decreased breath sounds. She has wheezes (scattered exp wheezes throughout; no focal consolidation). She has no rhonchi. She has no rales.  Lymphadenopathy:    She has no cervical adenopathy.  Skin: Skin is warm and dry. She is not diaphoretic.  Vitals reviewed.       Assessment & Plan:     1. Bronchitis Worsening symptoms that did not respond to augmentin. Will give levaquin as below. Did not give a steroid because patient is an uncontrolled T2DM. Hycodan cough syrup given for nighttime cough. She is to call the office if SOB or wheezing worsens. - levofloxacin (LEVAQUIN) 500 MG tablet; Take 1 tablet (500 mg total) by mouth daily.  Dispense: 7 tablet; Refill: 0 - HYDROcodone-homatropine (HYCODAN) 5-1.5 MG/5ML syrup; Take 5 mLs by mouth every 8 (eight) hours as needed.  Dispense: 180 mL; Refill: 0         Mar Daring, PA-C  [facility]

## 2017-01-01 NOTE — Patient Instructions (Signed)

## 2017-02-02 DIAGNOSIS — G43719 Chronic migraine without aura, intractable, without status migrainosus: Secondary | ICD-10-CM | POA: Diagnosis not present

## 2017-02-02 DIAGNOSIS — I609 Nontraumatic subarachnoid hemorrhage, unspecified: Secondary | ICD-10-CM | POA: Diagnosis not present

## 2017-02-02 DIAGNOSIS — Q282 Arteriovenous malformation of cerebral vessels: Secondary | ICD-10-CM | POA: Diagnosis not present

## 2017-02-02 DIAGNOSIS — E1142 Type 2 diabetes mellitus with diabetic polyneuropathy: Secondary | ICD-10-CM | POA: Diagnosis not present

## 2017-02-03 ENCOUNTER — Other Ambulatory Visit: Payer: Self-pay | Admitting: Physician Assistant

## 2017-02-03 MED ORDER — OXYCODONE-ACETAMINOPHEN 5-325 MG PO TABS: 1.0000 | ORAL_TABLET | Freq: Four times a day (QID) | ORAL | 0 refills | Status: DC | PRN

## 2017-02-03 NOTE — Telephone Encounter (Signed)
Please review. Thanks!  

## 2017-02-03 NOTE — Telephone Encounter (Signed)
Pt needs refill on her pain   oxyCODONE  Thanks teri

## 2017-02-03 NOTE — Telephone Encounter (Signed)
Patient advised.  Thanks,  -Joseline 

## 2017-02-03 NOTE — Telephone Encounter (Signed)
Rx printed. Patient does not overuse. Last refill was 12/17/16. [place] substance registry was checked and patient has not requested early, it is only filled by me, and she does not pharmacy shop.

## 2017-02-05 DIAGNOSIS — F99 Mental disorder, not otherwise specified: Secondary | ICD-10-CM | POA: Diagnosis not present

## 2017-02-08 ENCOUNTER — Other Ambulatory Visit: Payer: Self-pay | Admitting: Neurology

## 2017-02-08 DIAGNOSIS — I609 Nontraumatic subarachnoid hemorrhage, unspecified: Secondary | ICD-10-CM

## 2017-02-11 ENCOUNTER — Ambulatory Visit (INDEPENDENT_AMBULATORY_CARE_PROVIDER_SITE_OTHER): Payer: BLUE CROSS/BLUE SHIELD | Admitting: Physician Assistant

## 2017-02-11 ENCOUNTER — Encounter: Payer: Self-pay | Admitting: Physician Assistant

## 2017-02-11 VITALS — BP 130/80 | HR 83 | Temp 97.5°F | Resp 16 | Wt 226.0 lb

## 2017-02-11 DIAGNOSIS — IMO0002 Reserved for concepts with insufficient information to code with codable children: Secondary | ICD-10-CM

## 2017-02-11 DIAGNOSIS — E1165 Type 2 diabetes mellitus with hyperglycemia: Secondary | ICD-10-CM

## 2017-02-11 DIAGNOSIS — L309 Dermatitis, unspecified: Secondary | ICD-10-CM

## 2017-02-11 DIAGNOSIS — E1142 Type 2 diabetes mellitus with diabetic polyneuropathy: Secondary | ICD-10-CM

## 2017-02-11 LAB — POCT GLYCOSYLATED HEMOGLOBIN (HGB A1C)
Est. average glucose Bld gHb Est-mCnc: 249
Hemoglobin A1C: 10.3

## 2017-02-11 MED ORDER — GLIPIZIDE 10 MG PO TABS: 5.0000 mg | ORAL_TABLET | Freq: Two times a day (BID) | ORAL | 3 refills | Status: DC

## 2017-02-11 MED ORDER — EXENATIDE ER 2 MG/0.85ML ~~LOC~~ AUIJ: 2.0000 mg | AUTO-INJECTOR | SUBCUTANEOUS | 3 refills | Status: DC

## 2017-02-11 MED ORDER — TRIAMCINOLONE ACETONIDE 0.1 % EX CREA: 1.0000 "application " | TOPICAL_CREAM | Freq: Two times a day (BID) | CUTANEOUS | 0 refills | Status: DC

## 2017-02-11 NOTE — Patient Instructions (Signed)
Exenatide injection suspension, extended-release What is this medicine? EXENATIDE (ex EN a tide) is used to improve blood sugar control in adults with type 2 diabetes. This medicine may be used with other oral diabetes medicines. COMMON BRAND NAME(S): Bydureon What should I tell my health care provider before I take this medicine? They need to know if you have any of these conditions: -endocrine tumors (MEN 2) or if someone in your family had these tumors -history of pancreatitis -kidney disease or if you are on dialysis -stomach problems -thyroid cancer or if someone in your family had thyroid cancer -an unusual or allergic reaction to exenatide, medicines, foods, dyes, or preservatives -pregnant or trying to get pregnant -breast-feeding How should I use this medicine? This medicine is for injection under the skin of your upper leg, stomach area, or upper arm. It is usually given once every week (every 7 days). You will be taught how to prepare and give this medicine. Use exactly as directed. Take your medicine at regular intervals. Do not take it more often than directed. It is important that you put your used needles and syringes in a special sharps container. Do not put them in a trash can. If you do not have a sharps container, call your pharmacist or healthcare provider to get one. A special MedGuide will be given to you by the pharmacist with each prescription and refill. Be sure to read this information carefully each time. Talk to your pediatrician regarding the use of this medicine in children. Special care may be needed. What if I miss a dose? If you miss a dose, take it as soon as you can, provided your next usual scheduled dose is due at least 3 days later. If you miss a dose and your next usual scheduled dose is due 1 or 2 days later, then do not take the missed dose. Take the next dose at your regular time. Do not take double or extra doses. If you have questions about a missed  dose, contact your health care provider for advice. What may interact with this medicine? Do not take this medicine with any of the following medications: -gatifloxacin This medicine may also interact with the following medications: -acetaminophen -birth control pills -digoxin -lisinopril -lovastatin -sulfonylureas -warfarin Many medications may cause changes in blood sugar, these include: -alcohol containing beverages -aspirin and aspirin-like drugs -chloramphenicol -chromium -diuretics -female hormones, such as estrogens or progestins, birth control pills -heart medicines -isoniazid -female hormones or anabolic steroids -medications for weight loss -medicines for allergies, asthma, cold, or cough -medicines for mental problems -medicines called MAO inhibitors - Nardil, Parnate, Marplan, Eldepryl -niacin -NSAIDS, such as ibuprofen -pentamidine -phenytoin -probenecid -quinolone antibiotics such as ciprofloxacin, levofloxacin, ofloxacin -some herbal dietary supplements -steroid medicines such as prednisone or cortisone -thyroid hormones Some medications can hide the warning symptoms of low blood sugar (hypoglycemia). You may need to monitor your blood sugar more closely if you are taking one of these medications. These include: -beta-blockers, often used for high blood pressure or heart problems (examples include atenolol, metoprolol, propranolol) -clonidine -guanethidine -reserpine What should I watch for while using this medicine? Visit your doctor or health care professional for regular checks on your progress. A test called the HbA1C (A1C) will be monitored. This is a simple blood test. It measures your blood sugar control over the last 2 to 3 months. You will receive this test every 3 to 6 months. Learn how to check your blood sugar. Learn the symptoms of low  and high blood sugar and how to manage them. Always carry a quick-source of sugar with you in case you have  symptoms of low blood sugar. Examples include hard sugar candy or glucose tablets. Make sure others know that you can choke if you eat or drink when you develop serious symptoms of low blood sugar, such as seizures or unconsciousness. They must get medical help at once. Tell your doctor or health care professional if you have high blood sugar. You might need to change the dose of your medicine. If you are sick or exercising more than usual, you might need to change the dose of your medicine. Do not skip meals. Ask your doctor or health care professional if you should avoid alcohol. Many nonprescription cough and cold products contain sugar or alcohol. These can affect blood sugar. Exenatide pens and cartridges should never be shared. Even if the needle is changed, sharing may result in passing of viruses like hepatitis or HIV. Wear a medical ID bracelet or chain, and carry a card that describes your disease and details of your medicine and dosage times. What side effects may I notice from receiving this medicine? Side effects that you should report to your doctor or health care professional as soon as possible: -allergic reactions like skin rash, itching or hives, swelling of the face, lips, or tongue -breathing problems -signs and symptoms of low blood sugar such as feeling anxious, confusion, dizziness, increased hunger, unusually weak or tired, sweating, shakiness, cold, irritable, headache, blurred vision, fast heartbeat, loss of consciousness -swelling of the ankles, feet, hands -trouble passing urine or change in the amount of urine -unusual stomach pain or upset -unusually weak or tired -vomiting Side effects that usually do not require medical attention (report to your doctor or health care professional if they continue or are bothersome): -constipation -diarrhea -dizziness -headache -heartburn -itching, irritation, or redness at site where injected. -nausea Where should I keep my  medicine? Keep out of the reach of children. Store this medicine in a refrigerator between 2 and 8 degrees C (36 and 46 degrees F). Do not freeze or use if the medicine has been frozen. Protect from light and excessive heat. Each single-dose tray can be kept at a temperature not to exceed 25 degrees C (77 degrees F) for no more than a total of 4 weeks, if needed. Throw away any unused medicine after the expiration date.  2017 Elsevier/Gold Standard (2014-02-15 10:23:19)

## 2017-02-11 NOTE — Progress Notes (Signed)
Patient: Gabriella Marsh Female    DOB: 1966/07/15   51 y.o.   MRN: [id number] Visit Date: 02/11/2017  Today's Provider: Mar Daring, PA-C   Chief Complaint  Patient presents with  . Diabetes   Subjective:    Patient reports that she has been off Tanzeum for 3 weeks due to insurance not covering her medication any longer.    Diabetes Mellitus Type II, Follow-up:   Lab Results  Component Value Date   HGBA1C 10.3 02/11/2017   HGBA1C 11.2 11/11/2016   HGBA1C 11.2 08/12/2016    Last seen for diabetes 3 months ago.  Management since then includes refill glipizide to take BID, cont metformin, Tanzeum, and Basaglar. She reports good compliance with treatment. She is not having side effects.  Current symptoms include paresthesia of the feet and visual disturbances and have been stable. Home blood sugar records: fasting range: 220 in the last 3 weeks, pt reports she has been w/o Tanzeum  Episodes of hypoglycemia? no   Current Insulin Regimen: as prescribed Most Recent Eye Exam: 02/24/2017 Weight trend: stable Prior visit with dietician: no Current diet: in general, a "healthy" diet   Current exercise: walking  Pertinent Labs:    Component Value Date/Time   CHOL 167 08/28/2015 1030   TRIG 205 (H) 08/28/2015 1030   HDL 50 08/28/2015 1030   LDLCALC 76 08/28/2015 1030   CREATININE 0.59 06/28/2015 0700    Wt Readings from Last 3 Encounters:  02/11/17 226 lb (102.5 kg)  01/01/17 225 lb (102.1 kg)  12/22/16 228 lb (103.4 kg)    ------------------------------------------------------------------------      Allergies  Allergen Reactions  . Pregabalin Shortness Of Breath     Current Outpatient Prescriptions:  .  atorvastatin (LIPITOR) 20 MG tablet, TAKE 1 TABLET DAILY, Disp: 90 tablet, Rfl: 3 .  clonazePAM (KLONOPIN) 0.5 MG tablet, Take 1 tablet (0.5 mg total) by mouth at bedtime., Disp: 30 tablet, Rfl: 5 .  gabapentin (NEURONTIN) 100 MG capsule,  TAKE 2 CAPSULES THREE TIMES A DAY, ALONG WITH 600 MG THREE TIMES A DAY FOR A TOTAL DAILY DOSE OF 2400 MG DAILY, Disp: 180 capsule, Rfl: 5 .  gabapentin (NEURONTIN) 600 MG tablet, Take 1 tablet (600 mg total) by mouth 3 (three) times daily., Disp: 270 tablet, Rfl: 3 .  glipiZIDE (GLUCOTROL) 10 MG tablet, Take 0.5 tablets (5 mg total) by mouth 2 (two) times daily before a meal., Disp: 60 tablet, Rfl: 5 .  glucose blood test strip, Check blood sugars twice daily. OneTouch Verio strips., Disp: 200 each, Rfl: 5 .  Insulin Glargine (BASAGLAR KWIKPEN) 100 UNIT/ML Solostar Pen, Inject 15 Units into the skin daily at 10 pm., Disp: 15 mL, Rfl: 2 .  Insulin Pen Needle (CAREFINE PEN NEEDLES) 32G X 4 MM MISC, Use 1x a day, Disp: 50 each, Rfl: 11 .  Melatonin 3 MG TABS, Take 1 tablet by mouth at bedtime., Disp: , Rfl:  .  metFORMIN (GLUCOPHAGE-XR) 500 MG 24 hr tablet, TAKE 2 TABLETS TWICE A DAY WITH MEALS., Disp: 360 tablet, Rfl: 1 .  Multiple Vitamin (MULTI-VITAMINS) TABS, Take by mouth., Disp: , Rfl:  .  nortriptyline (PAMELOR) 25 MG capsule, TAKE 2 CAPSULES AT BEDTIME, Disp: 180 capsule, Rfl: 1 .  oxyCODONE-acetaminophen (PERCOCET/ROXICET) 5-325 MG tablet, Take 1 tablet by mouth every 6 (six) hours as needed for severe pain., Disp: 60 tablet, Rfl: 0 .  venlafaxine (EFFEXOR) 75 MG tablet, Take 1 tablet (  75 mg total) by mouth 2 (two) times daily. (Patient taking differently: Take 150 mg by mouth at bedtime. 75 mg in the am, 150 mg qhs), Disp: 180 tablet, Rfl: 1 .  TANZEUM 50 MG PEN, ADMINISTER 50 MG UNDER THE SKIN WEEKLY (Patient not taking: Reported on 02/11/2017), Disp: 1 each, Rfl: 5  Review of Systems  Constitutional: Negative.   Respiratory: Negative.   Cardiovascular: Negative.   Endocrine: Negative.   Neurological: Positive for numbness.    Social History  Substance Use Topics  . Smoking status: Former Research scientist (life sciences)  . Smokeless tobacco: Never Used  . Alcohol use Not on file   Objective:   BP 130/80  (BP Location: Left Arm, Patient Position: Sitting, Cuff Size: Large)   Pulse 83   Temp 97.5 F (36.4 C) (Oral)   Resp 16   Wt 226 lb (102.5 kg)   BMI 31.97 kg/m   Physical Exam  Constitutional: She appears well-developed and well-nourished. No distress.  Neck: Normal range of motion. Neck supple. No JVD present. No tracheal deviation present. No thyromegaly present.  Cardiovascular: Normal rate, regular rhythm and normal heart sounds.  Exam reveals no gallop and no friction rub.   No murmur heard. Pulmonary/Chest: Effort normal and breath sounds normal. No respiratory distress. She has no wheezes. She has no rales.  Lymphadenopathy:    She has no cervical adenopathy.  Skin: She is not diaphoretic.  Vitals reviewed.  Diabetic Foot Exam - Simple   Simple Foot Form Diabetic Foot exam was performed with the following findings:  Yes 02/11/2017  2:00 PM  Visual Inspection No deformities, no ulcerations, no other skin breakdown bilaterally:  Yes Sensation Testing See comments:  Yes Pulse Check Posterior Tibialis and Dorsalis pulse intact bilaterally:  Yes Comments Diminished to no sensation with monofilament testing up to mid calf. Patient does have known neuropathy and is on gabapentin       Assessment & Plan:     1. Uncontrolled type 2 diabetes mellitus with peripheral neuropathy ([place]) Improving but still not to goal today with an A1c of 10.3. Since Tanzeum is no longer being produced and is not available will switch to trulicity as below. I will see her back in 3 months to recheck. If A1c still elevated will increase trulicity to 1.5mg /0.31mL. - POCT glycosylated hemoglobin (Hb A1C) - glipiZIDE (GLUCOTROL) 10 MG tablet; Take 0.5 tablets (5 mg total) by mouth 2 (two) times daily before a meal.  Dispense: 90 tablet; Refill: 3 - Dulaglutide (TRULICITY) [id number] SOPN; Inject 0.75 mg into the skin once a week.  Dispense: 4 pen; Refill: 3  2. Diabetic polyneuropathy associated with  type 2 diabetes mellitus (HCC) On gabapentin. Sees Dr. Manuella Ghazi with [facility]  3. Eczema, unspecified type Worsening. Will give triamcinolone as below. She is to call if symptoms worsen. - triamcinolone cream (KENALOG) 0.1 %; Apply 1 application topically 2 (two) times daily.  Dispense: 80 g; Refill: 0       Mar Daring, PA-C  [facility]

## 2017-02-12 ENCOUNTER — Ambulatory Visit
Admission: RE | Admit: 2017-02-12 | Discharge: 2017-02-12 | Disposition: A | Discharge status: Home / Self Care | Payer: BLUE CROSS/BLUE SHIELD | Source: Ambulatory Visit | Attending: Neurology | Admitting: Neurology

## 2017-02-12 ENCOUNTER — Other Ambulatory Visit
Admission: RE | Admit: 2017-02-12 | Discharge: 2017-02-12 | Disposition: A | Discharge status: Home / Self Care | Payer: BLUE CROSS/BLUE SHIELD | Source: Ambulatory Visit | Attending: Neurology | Admitting: Neurology

## 2017-02-12 DIAGNOSIS — R42 Dizziness and giddiness: Secondary | ICD-10-CM | POA: Diagnosis not present

## 2017-02-12 DIAGNOSIS — G43719 Chronic migraine without aura, intractable, without status migrainosus: Secondary | ICD-10-CM | POA: Diagnosis not present

## 2017-02-12 DIAGNOSIS — I609 Nontraumatic subarachnoid hemorrhage, unspecified: Secondary | ICD-10-CM | POA: Diagnosis not present

## 2017-02-12 DIAGNOSIS — R51 Headache: Secondary | ICD-10-CM | POA: Diagnosis not present

## 2017-02-12 LAB — BUN: BUN: 11 mg/dL (ref 6–20)

## 2017-02-12 LAB — CREATININE, SERUM
Creatinine, Ser: 0.55 mg/dL (ref 0.44–1.00)
GFR calc Af Amer: >60 mL/min (ref >60)

## 2017-02-12 MED ORDER — DULAGLUTIDE 0.75 MG/0.5ML ~~LOC~~ SOAJ: 0.7500 mg | SUBCUTANEOUS | 3 refills | Status: DC

## 2017-02-12 MED ORDER — GADOBENATE DIMEGLUMINE 529 MG/ML IV SOLN
20.0000 mL | Freq: Once | INTRAVENOUS | Status: AC | PRN
  Administered 2017-02-12: 20 mL via INTRAVENOUS

## 2017-02-19 ENCOUNTER — Telehealth: Payer: Self-pay | Admitting: Physician Assistant

## 2017-02-19 NOTE — Telephone Encounter (Signed)
Pt called saying that the insulin that you gave her last week worked.  She wants to know if you will call it in for her. She did not know the name of the rx.  [facility] [address].  [phone]  Thanks Con Memos

## 2017-02-22 NOTE — Telephone Encounter (Signed)
Please review. Thank you. sd  

## 2017-02-22 NOTE — Telephone Encounter (Signed)
I had sent it in for her but her insurance denied unfortunately. I have sent in a Rx for one similar called trulicity that is also once weekly injection.

## 2017-02-22 NOTE — Telephone Encounter (Signed)
Patient advised as below.  

## 2017-02-24 ENCOUNTER — Telehealth: Payer: Self-pay

## 2017-02-24 LAB — HM DIABETES EYE EXAM

## 2017-02-24 NOTE — Telephone Encounter (Signed)
Can we call pharmacy to see which med may be covered better since trulicity is on her formulary but still $$$. The other options are victoza, BCise, ozempic, and lyxumia.  Thanks.

## 2017-02-24 NOTE — Telephone Encounter (Signed)
Thank you for your help. I will leave this note open for further documentation for when she calls back. If we have a Bcise sample she can come get the for this week.

## 2017-02-24 NOTE — Telephone Encounter (Signed)
Pt called to inform you that her Trulicity has a copay of $548. I looked for savings card and samples of this medication, and there were none. Due to cost of medication, pt would like an alternative medication.

## 2017-02-24 NOTE — Telephone Encounter (Signed)
Per [facility] pharmacy they have no way of knowing which medications would be covered. They recommended that patient call insurance company. Advised patient she states she will call insurance company and call us back.

## 2017-03-02 ENCOUNTER — Encounter: Payer: Self-pay | Admitting: Physician Assistant

## 2017-03-02 NOTE — Telephone Encounter (Signed)
Called patient to asked about the update with her insurance. Per patient the insurance company was no help. She reports she went on website for Trulicity and they have like a saving account.(tax value card) and she applied and she is waiting on the response. Mean while she is going to come and get a sample for a week of BCise tomorrow.  Thanks,  -Joseline

## 2017-03-11 DIAGNOSIS — G43719 Chronic migraine without aura, intractable, without status migrainosus: Secondary | ICD-10-CM | POA: Diagnosis not present

## 2017-03-18 ENCOUNTER — Other Ambulatory Visit: Payer: Self-pay | Admitting: Physician Assistant

## 2017-03-18 NOTE — Telephone Encounter (Signed)
Pt needs refill  \ oxyCODONE-acetaminophen (PERCOCET/ROXICET) 5-325 MG tablet  Thank Diamond Nickel

## 2017-03-19 MED ORDER — OXYCODONE-ACETAMINOPHEN 5-325 MG PO TABS: 1.0000 | ORAL_TABLET | Freq: Four times a day (QID) | ORAL | 0 refills | Status: DC | PRN

## 2017-03-19 NOTE — Telephone Encounter (Signed)
Tawanna Sat, I think this is your patient Thanks ED

## 2017-03-19 NOTE — Telephone Encounter (Signed)
Patient advised prescription placed up front ready for pick up.  Thanks,  -Joseline 

## 2017-03-23 ENCOUNTER — Encounter: Payer: Self-pay | Admitting: Physician Assistant

## 2017-04-01 DIAGNOSIS — B351 Tinea unguium: Secondary | ICD-10-CM | POA: Diagnosis not present

## 2017-04-01 DIAGNOSIS — Z794 Long term (current) use of insulin: Secondary | ICD-10-CM | POA: Diagnosis not present

## 2017-04-01 DIAGNOSIS — E1142 Type 2 diabetes mellitus with diabetic polyneuropathy: Secondary | ICD-10-CM | POA: Diagnosis not present

## 2017-04-27 ENCOUNTER — Other Ambulatory Visit: Payer: Self-pay | Admitting: Physician Assistant

## 2017-04-27 DIAGNOSIS — M5137 Other intervertebral disc degeneration, lumbosacral region: Secondary | ICD-10-CM

## 2017-04-27 NOTE — Telephone Encounter (Signed)
Pt needs refill on her   oxyCODONE-acetaminophen (PERCOCET/ROXICET) 5-325 MG tablet  ThanksTeri

## 2017-04-28 MED ORDER — OXYCODONE-ACETAMINOPHEN 5-325 MG PO TABS: 1.0000 | ORAL_TABLET | Freq: Four times a day (QID) | ORAL | 0 refills | Status: DC | PRN

## 2017-04-28 NOTE — Telephone Encounter (Signed)
Patient advised RX is at the front desk ready for pick up.

## 2017-04-28 NOTE — Telephone Encounter (Signed)
Refill printed for percocet. NCSR reviewed and no red flags noted.

## 2017-04-29 ENCOUNTER — Other Ambulatory Visit: Payer: Self-pay | Admitting: Physician Assistant

## 2017-04-29 DIAGNOSIS — E1140 Type 2 diabetes mellitus with diabetic neuropathy, unspecified: Secondary | ICD-10-CM

## 2017-04-29 MED ORDER — GABAPENTIN 600 MG PO TABS: 600.0000 mg | ORAL_TABLET | Freq: Three times a day (TID) | ORAL | 0 refills | Status: DC

## 2017-04-29 MED ORDER — GABAPENTIN 600 MG PO TABS: 600.0000 mg | ORAL_TABLET | Freq: Three times a day (TID) | ORAL | 3 refills | Status: DC

## 2017-04-29 NOTE — Telephone Encounter (Signed)
[facility]ed a request for the following medication. Thanks CC  gabapentin (NEURONTIN) 600 MG tablet

## 2017-04-29 NOTE — Telephone Encounter (Signed)
Pt advised. Gabriella Marsh, CMA  

## 2017-04-29 NOTE — Telephone Encounter (Signed)
Patient is completely out of gabapentin (NEURONTIN) 600 MG tablet.  She uses mail order so she will not get this for several days so she would like 7 days supply sent in to [facility] in [place].

## 2017-05-08 ENCOUNTER — Other Ambulatory Visit: Payer: Self-pay | Admitting: Physician Assistant

## 2017-05-08 DIAGNOSIS — E1140 Type 2 diabetes mellitus with diabetic neuropathy, unspecified: Secondary | ICD-10-CM

## 2017-05-10 NOTE — Telephone Encounter (Signed)
This was prescribed for one week while she was waiting for a mail order prescription of the medication. Her mail order medications came yesterday, and she does not need this refilled. I will refuse this medication. Renaldo Fiddler, CMA

## 2017-05-10 NOTE — Telephone Encounter (Signed)
Why was she only prescribed 21 pills initially?

## 2017-05-10 NOTE — Telephone Encounter (Signed)
Jenni's pt. LOV 04/01/2017. Renaldo Fiddler, CMA

## 2017-05-20 ENCOUNTER — Ambulatory Visit: Payer: BLUE CROSS/BLUE SHIELD | Admitting: Physician Assistant

## 2017-06-05 ENCOUNTER — Other Ambulatory Visit: Payer: Self-pay | Admitting: Physician Assistant

## 2017-06-05 DIAGNOSIS — IMO0002 Reserved for concepts with insufficient information to code with codable children: Secondary | ICD-10-CM

## 2017-06-05 DIAGNOSIS — E1142 Type 2 diabetes mellitus with diabetic polyneuropathy: Secondary | ICD-10-CM

## 2017-06-05 DIAGNOSIS — E1165 Type 2 diabetes mellitus with hyperglycemia: Principal | ICD-10-CM

## 2017-06-09 ENCOUNTER — Other Ambulatory Visit: Payer: Self-pay | Admitting: Physician Assistant

## 2017-06-09 DIAGNOSIS — M5137 Other intervertebral disc degeneration, lumbosacral region: Secondary | ICD-10-CM

## 2017-06-09 MED ORDER — OXYCODONE-ACETAMINOPHEN 5-325 MG PO TABS: 1.0000 | ORAL_TABLET | Freq: Four times a day (QID) | ORAL | 0 refills | Status: DC | PRN

## 2017-06-09 NOTE — Telephone Encounter (Signed)
Medication refilled and NCSR reviewed and no red flags noted

## 2017-06-09 NOTE — Telephone Encounter (Signed)
Last ov 02/11/17 Last filled 04/28/17 Please review. Thank you. sd

## 2017-06-09 NOTE — Telephone Encounter (Signed)
LMTCB patient needs to schedule appointment to follow up diabetes.

## 2017-06-09 NOTE — Telephone Encounter (Signed)
Pt needs refill on her   TAKE 2 CAPSULES AT BEDTIME   oxyCODONE-acetaminophen (PERCOCET/ROXICET) 5-325 MG tablet         Thanks teri

## 2017-06-16 ENCOUNTER — Other Ambulatory Visit: Payer: Self-pay | Admitting: Physician Assistant

## 2017-06-16 DIAGNOSIS — G2581 Restless legs syndrome: Secondary | ICD-10-CM

## 2017-07-14 ENCOUNTER — Ambulatory Visit (INDEPENDENT_AMBULATORY_CARE_PROVIDER_SITE_OTHER): Payer: BLUE CROSS/BLUE SHIELD | Admitting: Physician Assistant

## 2017-07-14 ENCOUNTER — Encounter: Payer: Self-pay | Admitting: Physician Assistant

## 2017-07-14 VITALS — BP 148/78 | Resp 16 | Ht 71.0 in | Wt 226.4 lb

## 2017-07-14 DIAGNOSIS — E1165 Type 2 diabetes mellitus with hyperglycemia: Secondary | ICD-10-CM | POA: Diagnosis not present

## 2017-07-14 DIAGNOSIS — E1142 Type 2 diabetes mellitus with diabetic polyneuropathy: Secondary | ICD-10-CM | POA: Diagnosis not present

## 2017-07-14 DIAGNOSIS — M5137 Other intervertebral disc degeneration, lumbosacral region: Secondary | ICD-10-CM

## 2017-07-14 DIAGNOSIS — K219 Gastro-esophageal reflux disease without esophagitis: Secondary | ICD-10-CM | POA: Diagnosis not present

## 2017-07-14 DIAGNOSIS — IMO0002 Reserved for concepts with insufficient information to code with codable children: Secondary | ICD-10-CM

## 2017-07-14 DIAGNOSIS — I10 Essential (primary) hypertension: Secondary | ICD-10-CM

## 2017-07-14 LAB — POCT GLYCOSYLATED HEMOGLOBIN (HGB A1C): Hemoglobin A1C: 10.2

## 2017-07-14 MED ORDER — TRIAMTERENE-HCTZ 37.5-25 MG PO TABS: 1.0000 | ORAL_TABLET | Freq: Every day | ORAL | 3 refills | Status: DC

## 2017-07-14 MED ORDER — OXYCODONE-ACETAMINOPHEN 5-325 MG PO TABS: 1.0000 | ORAL_TABLET | Freq: Four times a day (QID) | ORAL | 0 refills | Status: DC | PRN

## 2017-07-14 MED ORDER — INSULIN GLARGINE 100 UNIT/ML SOLOSTAR PEN: 20.0000 [IU] | PEN_INJECTOR | Freq: Every day | SUBCUTANEOUS | 2 refills | Status: DC

## 2017-07-14 MED ORDER — OMEPRAZOLE 20 MG PO CPDR: 20.0000 mg | DELAYED_RELEASE_CAPSULE | Freq: Every day | ORAL | 3 refills | Status: DC

## 2017-07-14 MED ORDER — INSULIN PEN NEEDLE 32G X 4 MM MISC: 11 refills | Status: DC

## 2017-07-14 NOTE — Progress Notes (Signed)
Patient: Gabriella Marsh Female    DOB: 11/16/1966   51 y.o.   MRN: [id number] Visit Date: 07/14/2017  Today's Provider: Mar Daring, PA-C   Chief Complaint  Patient presents with  . Diabetes   Subjective:    HPI  Diabetes Mellitus Type II, Follow-up:   Lab Results  Component Value Date   HGBA1C 10.2 07/14/2017   HGBA1C 10.3 02/11/2017   HGBA1C 11.2 11/11/2016    Last seen for diabetes 5 months ago.  Management since then includes adding trulicity. However, patient reports that she  She reports fair compliance with treatment. She is having side effects. She reports that she has daily headaches, but she has not passed out.  Home blood sugar records: fasting range: 300s per patient  Episodes of hypoglycemia? no   Current Insulin Regimen: none Most Recent Eye Exam: due Weight trend: stable Prior visit with dietician: no Current diet: well balanced Current exercise: walking  Pertinent Labs:    Component Value Date/Time   CHOL 167 08/28/2015 1030   TRIG 205 (H) 08/28/2015 1030   HDL 50 08/28/2015 1030   LDLCALC 76 08/28/2015 1030   CREATININE 0.55 02/12/2017 1449    Wt Readings from Last 3 Encounters:  07/14/17 226 lb 6.4 oz (102.7 kg)  02/11/17 226 lb (102.5 kg)  01/01/17 225 lb (102.1 kg)      Allergies  Allergen Reactions  . Pregabalin Shortness Of Breath     Current Outpatient Prescriptions:  .  atorvastatin (LIPITOR) 20 MG tablet, TAKE 1 TABLET DAILY, Disp: 90 tablet, Rfl: 3 .  clonazePAM (KLONOPIN) 0.5 MG tablet, Take 1 tablet (0.5 mg total) by mouth at bedtime., Disp: 30 tablet, Rfl: 5 .  gabapentin (NEURONTIN) 100 MG capsule, TAKE 2 CAPSULES THREE TIMES A DAY, ALONG WITH 600 MG THREE TIMES A DAY FOR A TOTAL DAILY DOSE OF 2400 MG DAILY, Disp: 180 capsule, Rfl: 5 .  gabapentin (NEURONTIN) 600 MG tablet, Take 1 tablet (600 mg total) by mouth 3 (three) times daily., Disp: 21 tablet, Rfl: 0 .  glipiZIDE (GLUCOTROL) 10 MG tablet, Take 0.5  tablets (5 mg total) by mouth 2 (two) times daily before a meal., Disp: 90 tablet, Rfl: 3 .  glucose blood test strip, Check blood sugars twice daily. OneTouch Verio strips., Disp: 200 each, Rfl: 5 .  Insulin Pen Needle (CAREFINE PEN NEEDLES) 32G X 4 MM MISC, Use 1x a day, Disp: 50 each, Rfl: 11 .  Melatonin 3 MG TABS, Take 1 tablet by mouth at bedtime., Disp: , Rfl:  .  metFORMIN (GLUCOPHAGE-XR) 500 MG 24 hr tablet, TAKE 2 TABLETS TWICE A DAY WITH MEALS, Disp: 360 tablet, Rfl: 1 .  Multiple Vitamin (MULTI-VITAMINS) TABS, Take by mouth., Disp: , Rfl:  .  nortriptyline (PAMELOR) 25 MG capsule, TAKE 2 CAPSULES AT BEDTIME, Disp: 180 capsule, Rfl: 1 .  oxyCODONE-acetaminophen (PERCOCET/ROXICET) 5-325 MG tablet, Take 1 tablet by mouth every 6 (six) hours as needed for severe pain., Disp: 60 tablet, Rfl: 0 .  Dulaglutide (TRULICITY) 1.51 VO/1.6WV SOPN, Inject 0.75 mg into the skin once a week. (Patient not taking: Reported on 07/14/2017), Disp: 4 pen, Rfl: 3 .  Insulin Glargine (BASAGLAR KWIKPEN) 100 UNIT/ML Solostar Pen, Inject 15 Units into the skin daily at 10 pm. (Patient not taking: Reported on 07/14/2017), Disp: 15 mL, Rfl: 2 .  triamcinolone cream (KENALOG) 0.1 %, Apply 1 application topically 2 (two) times daily. (Patient not taking: Reported on  07/14/2017), Disp: 80 g, Rfl: 0 .  venlafaxine (EFFEXOR) 75 MG tablet, Take 1 tablet (75 mg total) by mouth 2 (two) times daily. (Patient taking differently: Take 150 mg by mouth at bedtime. 75 mg in the am, 150 mg qhs), Disp: 180 tablet, Rfl: 1  Review of Systems  Constitutional: Negative.   Respiratory: Negative.   Cardiovascular: Negative.   Gastrointestinal: Negative.   Endocrine: Negative.   Musculoskeletal: Negative.   Neurological: Positive for numbness and headaches.    Social History  Substance Use Topics  . Smoking status: Former Research scientist (life sciences)  . Smokeless tobacco: Never Used  . Alcohol use Not on file   Objective:   BP (!) 148/78 (BP  Location: Right Arm, Patient Position: Sitting, Cuff Size: Normal)   Resp 16   Ht 5\' 11"  (1.803 m)   Wt 226 lb 6.4 oz (102.7 kg)   BMI 31.58 kg/m  Vitals:   07/14/17 1637  BP: (!) 148/78  Resp: 16  Weight: 226 lb 6.4 oz (102.7 kg)  Height: 5\' 11"  (1.803 m)     Physical Exam  Constitutional: She appears well-developed and well-nourished. No distress.  Neck: Normal range of motion. Neck supple. No JVD present. No tracheal deviation present. No thyromegaly present.  Cardiovascular: Normal rate, regular rhythm and normal heart sounds.  Exam reveals no gallop and no friction rub.   No murmur heard. Pulmonary/Chest: Effort normal and breath sounds normal. No respiratory distress. She has no wheezes. She has no rales.  Musculoskeletal: She exhibits no edema.  Lymphadenopathy:    She has no cervical adenopathy.  Skin: She is not diaphoretic.  Vitals reviewed.       Assessment & Plan:     1. Uncontrolled type 2 diabetes mellitus with peripheral neuropathy (HCC) A1c stable at 10.2. Patient has not been using Trulicity or BCise due to insurance. Will discontinue any GLP-1 at this time due to financial constraints. Will increase basaglar insulin to 20 units, from 15 units. I will see her back in 1-3 months to recheck her A1c. - POCT glycosylated hemoglobin (Hb A1C) - Insulin Glargine (LANTUS) 100 UNIT/ML Solostar Pen; Inject 20 Units into the skin daily at 10 pm.  Dispense: 15 mL; Refill: 2 - Insulin Pen Needle (CAREFINE PEN NEEDLES) 32G X 4 MM MISC; Use 1x a day  Dispense: 50 each; Refill: 11  2. Essential hypertension Stable. Diagnosis pulled for medication refill. Continue current medical treatment plan. - triamterene-hydrochlorothiazide (MAXZIDE-25) 37.5-25 MG tablet; Take 1 tablet by mouth daily.  Dispense: 90 tablet; Refill: 3  3. DDD (degenerative disc disease), lumbosacral Stable. Diagnosis pulled for medication refill. Continue current medical treatment plan. -  oxyCODONE-acetaminophen (PERCOCET/ROXICET) 5-325 MG tablet; Take 1 tablet by mouth every 6 (six) hours as needed for severe pain.  Dispense: 60 tablet; Refill: 0  4. Gastroesophageal reflux disease, esophagitis presence not specified Stable. Diagnosis pulled for medication refill. Continue current medical treatment plan. - omeprazole (PRILOSEC) 20 MG capsule; Take 1 capsule (20 mg total) by mouth daily.  Dispense: 30 capsule; Refill: [place], PA-C  [facility]

## 2017-07-14 NOTE — Patient Instructions (Signed)
Hydrochlorothiazide, HCTZ; Triamterene tablets or capsules What is this medicine? HYDROCHLOROTHIAZIDE; TRIAMTERENE (hye droe klor oh THYE a zide; trye AM ter een) is a diuretic. It helps you make more urine and lose the extra water from your body. This medicine is used to treat high blood pressure and edema or swelling from excess water. This medicine may be used for other purposes; ask your health care provider or pharmacist if you have questions. COMMON BRAND NAME(S): Dyazide, Maxzide What should I tell my health care provider before I take this medicine? They need to know if you have any of these conditions: -diabetes -immune system problems, like lupus -kidney disease or stones -liver disease -small amount of urine or difficulty passing urine -an unusual or allergic reaction to triamterene, hydrochlorothiazide, sulfa drugs, other medicines, foods, dyes, or preservatives -pregnant or trying to get pregnant -breast-feeding How should I use this medicine? Take this medicine by mouth with a glass of water. Follow the directions on your prescription label. Take your medicine at regular intervals. Do not take it more often than directed. Do not stop taking except on your doctor's advice. Remember that you will need to pass urine frequently after taking this medicine. Do not take your doses at a time of day that will cause you problems. Do not take at bedtime. Talk to your pediatrician regarding the use of this medicine in children. Special care may be needed. Overdosage: If you think you have taken too much of this medicine contact a poison control center or emergency room at once. NOTE: This medicine is only for you. Do not share this medicine with others. What if I miss a dose? If you miss a dose, take it as soon as you can. If it is almost time for your next dose, take only that dose. Do not take double or extra doses. What may interact with this medicine? Do not take this medicine with any  of the following medications: -eplerenone This medicine may also interact with the following medications: -cyclosporine -heart medicines like ACE inhibitors, digoxin, dofetilide, eplerenone, angiotensin II antagonists, and medicines for blood pressure -lithium -medicines for diabetes -medicines for inflammation like indomethacin -medicines that relax muscles for surgery -other diuretics -potassium -sotalol -tacrolimus This list may not describe all possible interactions. Give your health care provider a list of all the medicines, herbs, non-prescription drugs, or dietary supplements you use. Also tell them if you smoke, drink alcohol, or use illegal drugs. Some items may interact with your medicine. What should I watch for while using this medicine? Visit your doctor or health care professional for regular check ups. You will need lab work done before you start this medicine and regularly while you are taking it. Check your blood pressure regularly. Ask your health care professional what your blood pressure should be, and when you should contact them. If you are a diabetic, check your blood sugar as directed. Do not stop taking your medicine unless your doctor tells you to. You may need to be on a special diet while taking this medicine. Ask your doctor. Also, ask how many glasses of fluid you need to drink a day. You must not get dehydrated. You may get drowsy or dizzy. Do not drive, use machinery, or do anything that needs mental alertness until you know how this medicine affects you. Do not stand or sit up quickly, especially if you are an older patient. This reduces the risk of dizzy or fainting spells. Alcohol may interfere with the effect   of this medicine. Avoid or limit alcoholic drinks. This medicine can make you more sensitive to the sun. Keep out of the sun. If you cannot avoid being in the sun, wear protective clothing and use sunscreen. Do not use sun lamps or tanning beds/booths. What  side effects may I notice from receiving this medicine? Side effects that you should report to your doctor or health care professional as soon as possible: -allergic reactions such as skin rash or itching, hives, swelling of the lips, mouth, tongue, or throat -changes in vision -eye pain -fast or irregular heartbeat, chest pain -feeling faint or dizzy -gout attack -muscle pain or cramps -numbness or tingling in hands, feet, or lips -pain or difficulty when passing urine -redness, blistering, peeling or loosening of the skin, including inside the mouth -shortness of breath -unusually weak or tired Side effects that usually do not require medical attention (report to your doctor or health care professional if they continue or are bothersome): -change in sex drive or performance -dry mouth -headache -stomach upset This list may not describe all possible side effects. Call your doctor for medical advice about side effects. You may report side effects to FDA at 1-800-FDA-1088. Where should I keep my medicine? Keep out of the reach of children. Store at room temperature between 15 and 30 degrees C (59 and 86 degrees F). Protect from light. Throw away any unused medicine after the expiration date. NOTE: This sheet is a summary. It may not cover all possible information. If you have questions about this medicine, talk to your doctor, pharmacist, or health care provider.  2018 Elsevier/Gold Standard (2010-08-27 14:54:04)  

## 2017-08-18 IMAGING — CR DG CHEST 2V
1 series · 2 of 2 positions shown · non-contrast
Comparison: November 13, 2008

CLINICAL DATA: Cough and congestion

EXAM:
CHEST  2 VIEW

[Series 1: dg chest 2 view · 0.14mm/px · 2 of 2 slices shown]
[im 1/2]
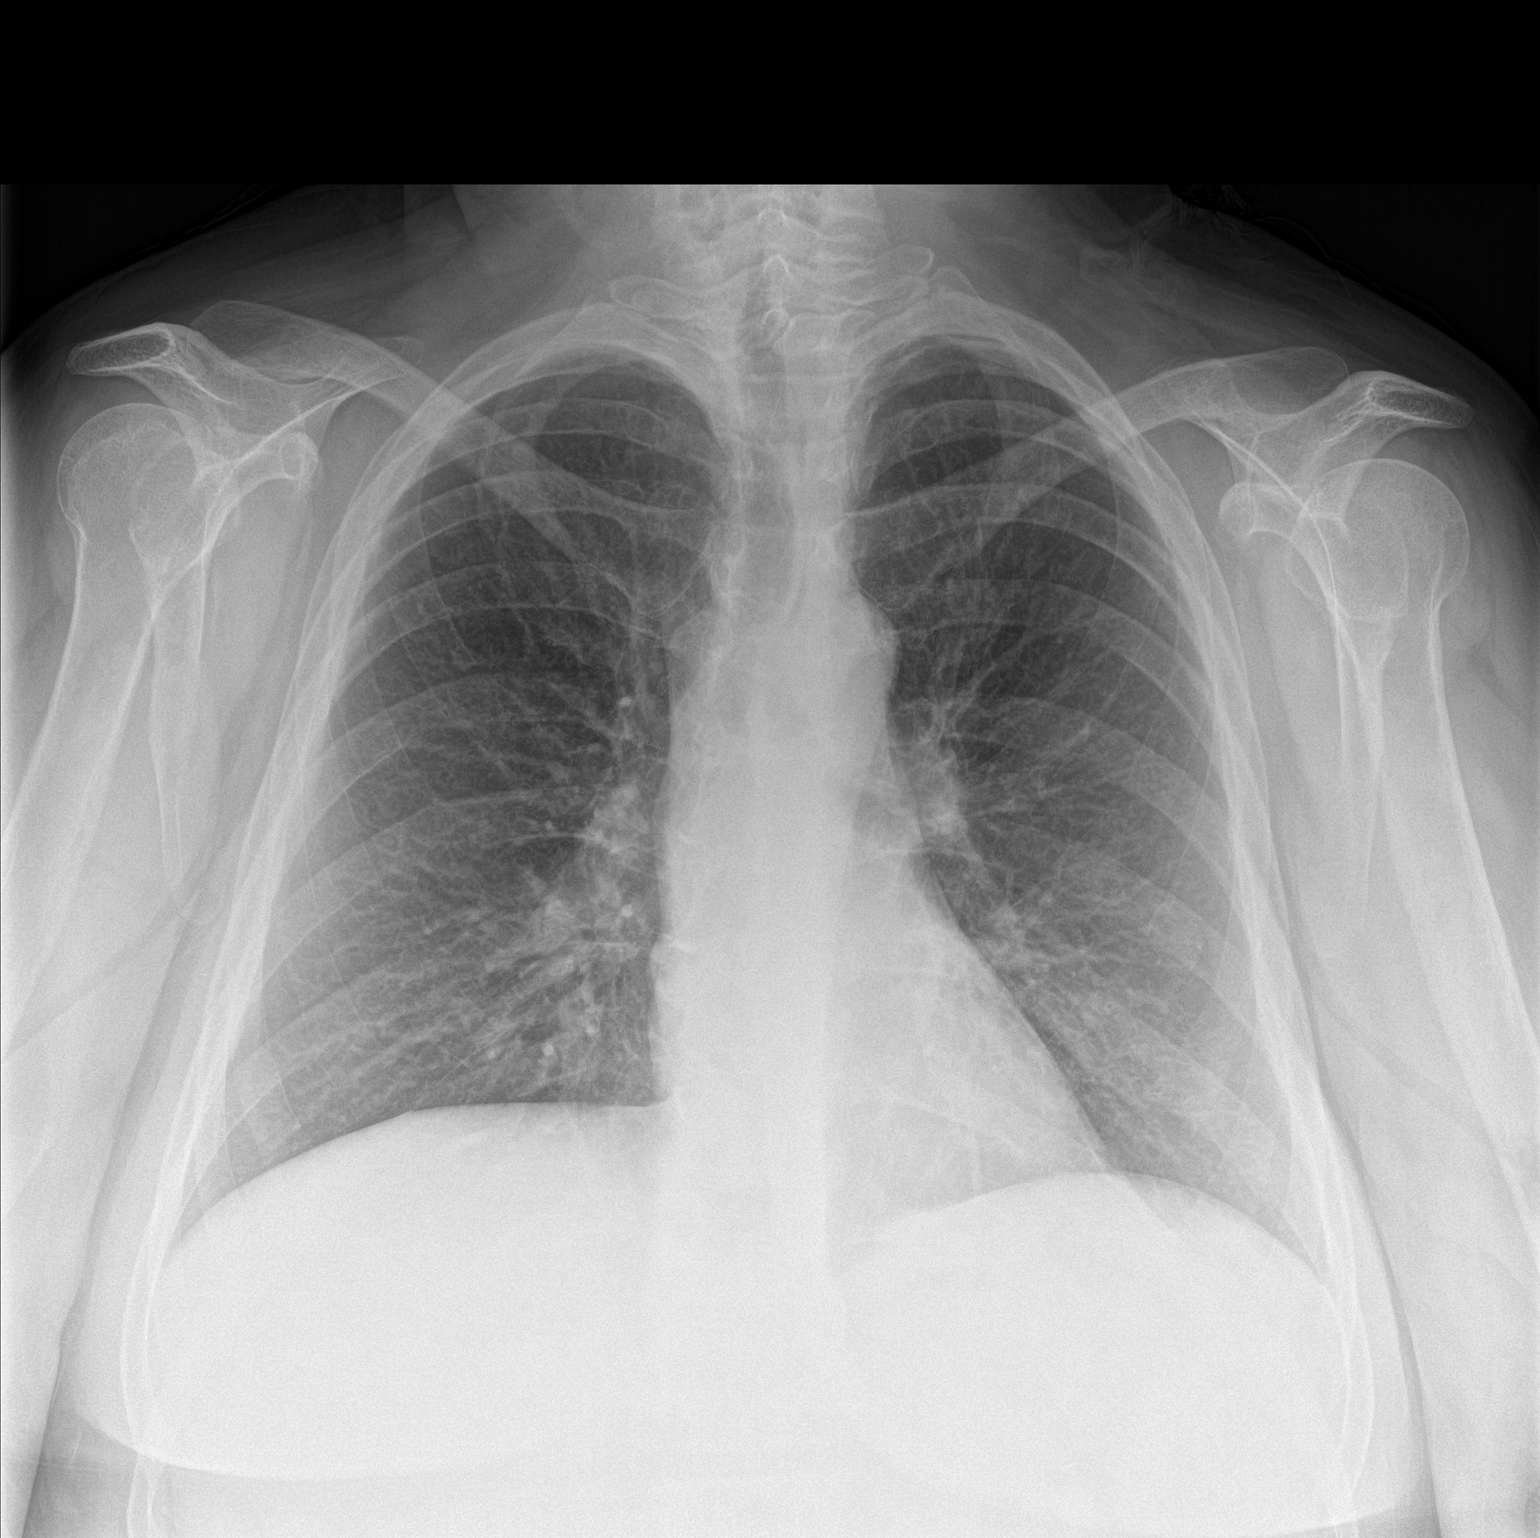
[im 2/2]
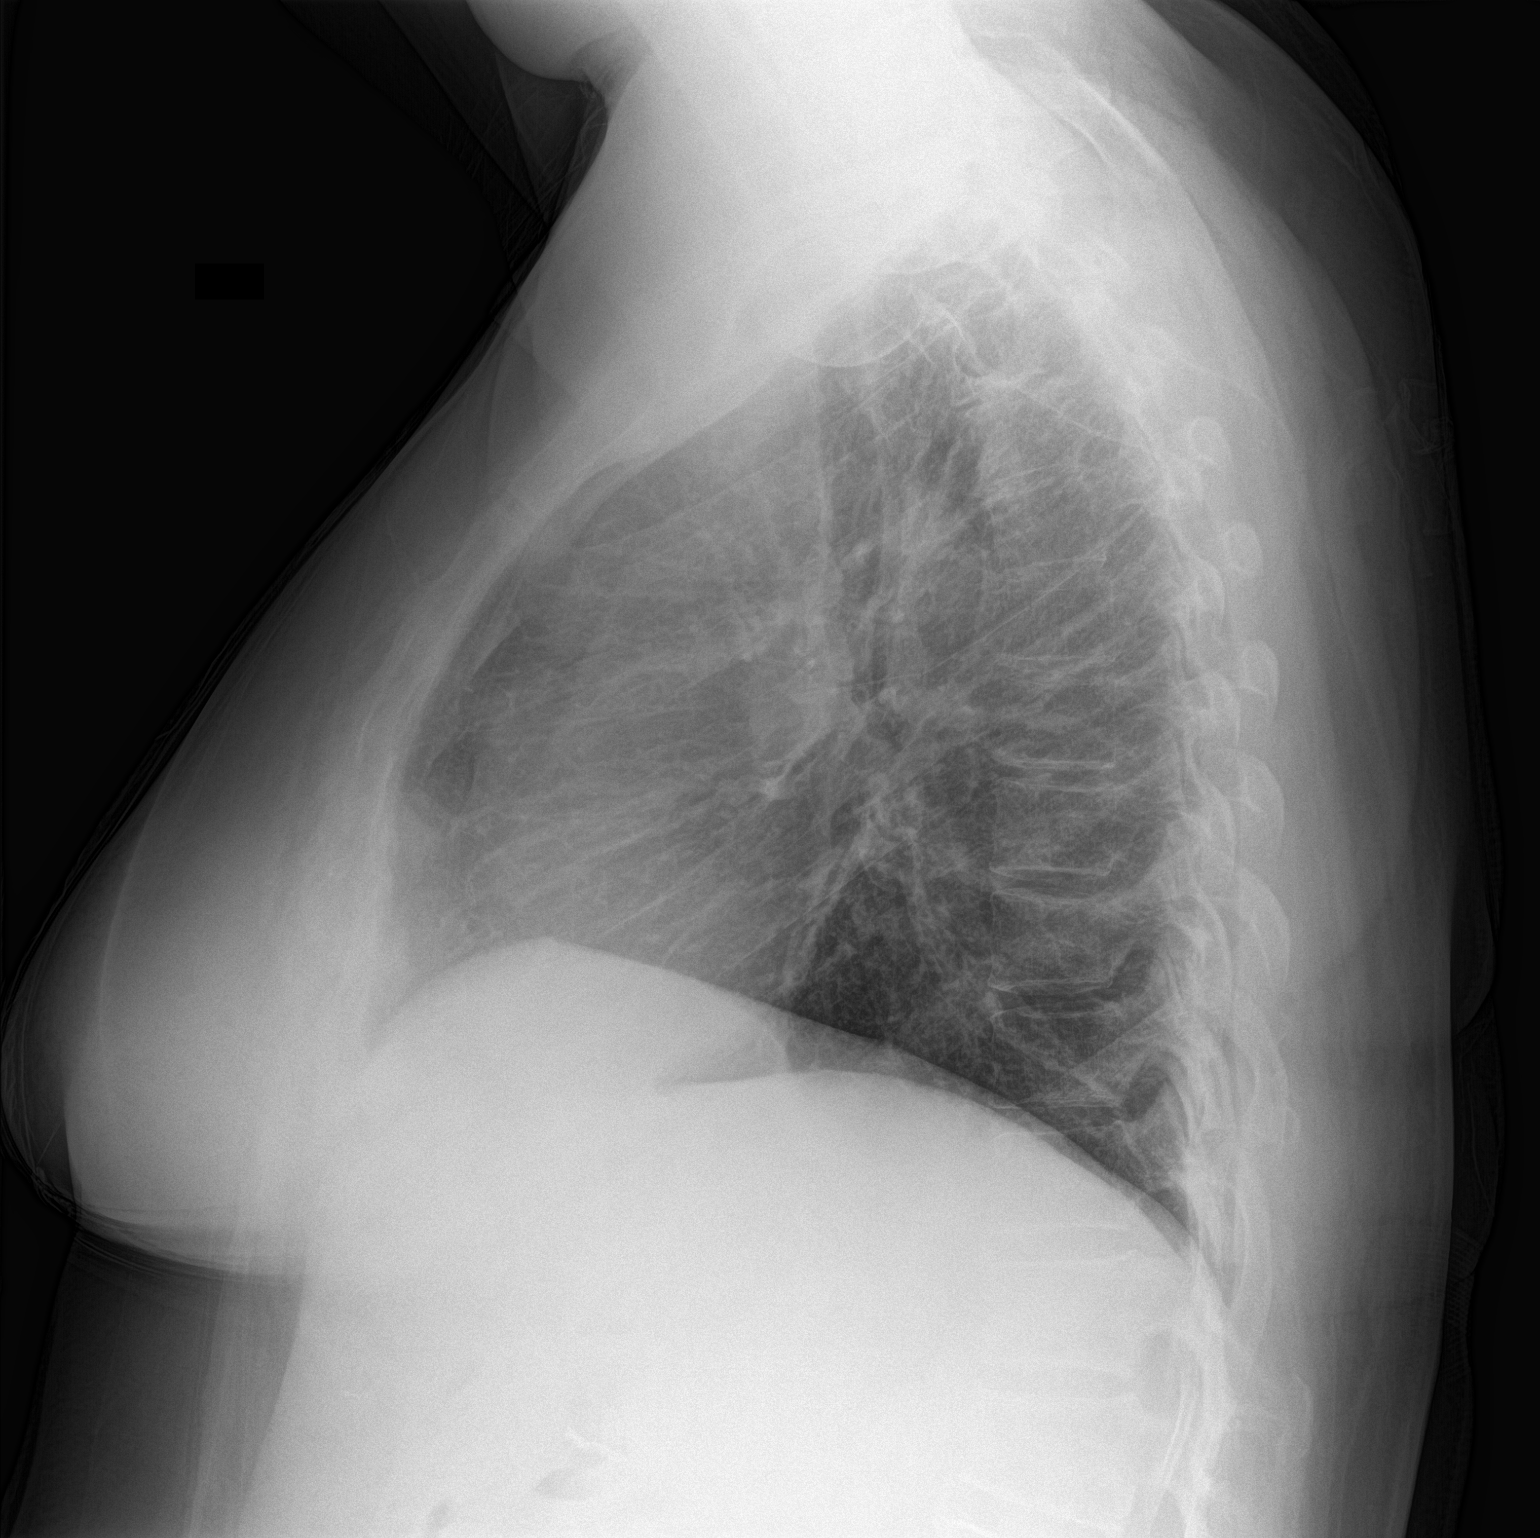

[2 of 2 positions shown; findings below may reference images not displayed]

FINDINGS: Lungs are clear. Heart size and pulmonary vascularity are normal. No
adenopathy. No bone lesions.
IMPRESSION: No edema or consolidation.

## 2017-08-19 ENCOUNTER — Other Ambulatory Visit: Payer: Self-pay | Admitting: Physician Assistant

## 2017-08-19 DIAGNOSIS — M5137 Other intervertebral disc degeneration, lumbosacral region: Secondary | ICD-10-CM

## 2017-08-19 MED ORDER — OXYCODONE-ACETAMINOPHEN 5-325 MG PO TABS: 1.0000 | ORAL_TABLET | Freq: Four times a day (QID) | ORAL | 0 refills | Status: DC | PRN

## 2017-08-19 NOTE — Telephone Encounter (Signed)
Rx printed. NCCSR reviewed. 

## 2017-08-19 NOTE — Telephone Encounter (Signed)
Pt contacted office for refill request on the following medications:  oxyCODONE-acetaminophen (PERCOCET/ROXICET) 5-325 MG tablet   CB#[phone]/MW

## 2017-08-19 NOTE — Telephone Encounter (Signed)
Patient advised RX is ready for pick up. Patient's mother Lynwood Dawley will be picking up Rx today. She is unable to pick it up due to working in [place].

## 2017-09-20 ENCOUNTER — Other Ambulatory Visit: Payer: Self-pay | Admitting: Physician Assistant

## 2017-09-20 DIAGNOSIS — M5137 Other intervertebral disc degeneration, lumbosacral region: Secondary | ICD-10-CM

## 2017-09-20 MED ORDER — OXYCODONE-ACETAMINOPHEN 5-325 MG PO TABS: 1.0000 | ORAL_TABLET | Freq: Four times a day (QID) | ORAL | 0 refills | Status: DC | PRN

## 2017-09-20 NOTE — Telephone Encounter (Signed)
Patient advised that prescription is placed up front ready for pick up.  Thanks,  -Emila Steinhauser

## 2017-09-20 NOTE — Telephone Encounter (Signed)
Pt contacted office for refill request on the following medications:  oxyCODONE-acetaminophen (PERCOCET/ROXICET) 5-325 MG   CB#[phone]/MW

## 2017-09-20 NOTE — Telephone Encounter (Signed)
Rx printed. NCCSR reviewed. 

## 2017-09-20 NOTE — Telephone Encounter (Signed)
Last filled 08/19/17. KW

## 2017-10-06 ENCOUNTER — Telehealth: Payer: Self-pay

## 2017-10-06 NOTE — Telephone Encounter (Signed)
Patient requesting letter stating that she is medically okay to return to work. Patient reports that she was taken out of work in April 2016. Patient reports she returned to work on May 20, 2017.

## 2017-10-07 ENCOUNTER — Encounter: Payer: Self-pay | Admitting: Physician Assistant

## 2017-10-07 NOTE — Telephone Encounter (Signed)
Letter printed.

## 2017-10-07 NOTE — Telephone Encounter (Signed)
Per patient she received the letter through [facility].  Thanks,  -Yehudit Fulginiti

## 2017-10-21 ENCOUNTER — Encounter: Payer: Self-pay | Admitting: Physician Assistant

## 2017-10-21 ENCOUNTER — Ambulatory Visit (INDEPENDENT_AMBULATORY_CARE_PROVIDER_SITE_OTHER): Payer: BLUE CROSS/BLUE SHIELD | Admitting: Physician Assistant

## 2017-10-21 VITALS — BP 140/80 | HR 93 | Temp 98.5°F | Resp 16 | Wt 228.0 lb

## 2017-10-21 DIAGNOSIS — E1142 Type 2 diabetes mellitus with diabetic polyneuropathy: Secondary | ICD-10-CM

## 2017-10-21 DIAGNOSIS — IMO0002 Reserved for concepts with insufficient information to code with codable children: Secondary | ICD-10-CM

## 2017-10-21 DIAGNOSIS — I10 Essential (primary) hypertension: Secondary | ICD-10-CM

## 2017-10-21 DIAGNOSIS — E1165 Type 2 diabetes mellitus with hyperglycemia: Secondary | ICD-10-CM

## 2017-10-21 DIAGNOSIS — M5137 Other intervertebral disc degeneration, lumbosacral region: Secondary | ICD-10-CM

## 2017-10-21 DIAGNOSIS — F411 Generalized anxiety disorder: Secondary | ICD-10-CM

## 2017-10-21 DIAGNOSIS — F321 Major depressive disorder, single episode, moderate: Secondary | ICD-10-CM

## 2017-10-21 LAB — POCT GLYCOSYLATED HEMOGLOBIN (HGB A1C)
ESTIMATED AVERAGE GLUCOSE: 50
HEMOGLOBIN A1C: 9.1

## 2017-10-21 LAB — POCT UA - MICROALBUMIN: Microalbumin Ur, POC: 50 mg/L

## 2017-10-21 MED ORDER — GABAPENTIN 100 MG PO CAPS: ORAL_CAPSULE | ORAL | 1 refills | Status: DC

## 2017-10-21 MED ORDER — TRIAMTERENE-HCTZ 37.5-25 MG PO TABS: 1.0000 | ORAL_TABLET | Freq: Every day | ORAL | 3 refills | Status: DC

## 2017-10-21 MED ORDER — OXYCODONE-ACETAMINOPHEN 5-325 MG PO TABS: 1.0000 | ORAL_TABLET | Freq: Four times a day (QID) | ORAL | 0 refills | Status: DC | PRN

## 2017-10-21 NOTE — Patient Instructions (Signed)
Diabetes Mellitus and Food It is important for you to manage your blood sugar (glucose) level. Your blood glucose level can be greatly affected by what you eat. Eating healthier foods in the appropriate amounts throughout the day at about the same time each day will help you control your blood glucose level. It can also help slow or prevent worsening of your diabetes mellitus. Healthy eating may even help you improve the level of your blood pressure and reach or maintain a healthy weight. General recommendations for healthful eating and cooking habits include:  Eating meals and snacks regularly. Avoid going long periods of time without eating to lose weight.  Eating a diet that consists mainly of plant-based foods, such as fruits, vegetables, nuts, legumes, and whole grains.  Using low-heat cooking methods, such as baking, instead of high-heat cooking methods, such as deep frying.  Work with your dietitian to make sure you understand how to use the Nutrition Facts information on food labels. How can food affect me? Carbohydrates Carbohydrates affect your blood glucose level more than any other type of food. Your dietitian will help you determine how many carbohydrates to eat at each meal and teach you how to count carbohydrates. Counting carbohydrates is important to keep your blood glucose at a healthy level, especially if you are using insulin or taking certain medicines for diabetes mellitus. Alcohol Alcohol can cause sudden decreases in blood glucose (hypoglycemia), especially if you use insulin or take certain medicines for diabetes mellitus. Hypoglycemia can be a life-threatening condition. Symptoms of hypoglycemia (sleepiness, dizziness, and disorientation) are similar to symptoms of having too much alcohol. If your health care provider has given you approval to drink alcohol, do so in moderation and use the following guidelines:  Women should not have more than one drink per day, and men  should not have more than two drinks per day. One drink is equal to: ? 12 oz of beer. ? 5 oz of wine. ? 1 oz of hard liquor.  Do not drink on an empty stomach.  Keep yourself hydrated. Have water, diet soda, or unsweetened iced tea.  Regular soda, juice, and other mixers might contain a lot of carbohydrates and should be counted.  What foods are not recommended? As you make food choices, it is important to remember that all foods are not the same. Some foods have fewer nutrients per serving than other foods, even though they might have the same number of calories or carbohydrates. It is difficult to get your body what it needs when you eat foods with fewer nutrients. Examples of foods that you should avoid that are high in calories and carbohydrates but low in nutrients include:  Trans fats (most processed foods list trans fats on the Nutrition Facts label).  Regular soda.  Juice.  Candy.  Sweets, such as cake, pie, doughnuts, and cookies.  Fried foods.  What foods can I eat? Eat nutrient-rich foods, which will nourish your body and keep you healthy. The food you should eat also will depend on several factors, including:  The calories you need.  The medicines you take.  Your weight.  Your blood glucose level.  Your blood pressure level.  Your cholesterol level.  You should eat a variety of foods, including:  Protein. ? Lean cuts of meat. ? Proteins low in saturated fats, such as fish, egg whites, and beans. Avoid processed meats.  Fruits and vegetables. ? Fruits and vegetables that may help control blood glucose levels, such as apples,   mangoes, and yams.  Dairy products. ? Choose fat-free or low-fat dairy products, such as milk, yogurt, and cheese.  Grains, bread, pasta, and rice. ? Choose whole grain products, such as multigrain bread, whole oats, and brown rice. These foods may help control blood pressure.  Fats. ? Foods containing healthful fats, such as  nuts, avocado, olive oil, canola oil, and fish.  Does everyone with diabetes mellitus have the same meal plan? Because every person with diabetes mellitus is different, there is not one meal plan that works for everyone. It is very important that you meet with a dietitian who will help you create a meal plan that is just right for you. This information is not intended to replace advice given to you by your health care provider. Make sure you discuss any questions you have with your health care provider. Document Released: 09/03/2005 Document Revised: 05/14/2016 Document Reviewed: 11/03/2013 Elsevier Interactive Patient Education  2017 Elsevier Inc[employer]  
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
## 2017-10-27 ENCOUNTER — Encounter: Payer: Self-pay | Admitting: Physician Assistant

## 2017-11-19 ENCOUNTER — Other Ambulatory Visit: Payer: Self-pay | Admitting: Physician Assistant

## 2017-11-19 DIAGNOSIS — M5137 Other intervertebral disc degeneration, lumbosacral region: Secondary | ICD-10-CM

## 2017-11-19 MED ORDER — OXYCODONE-ACETAMINOPHEN 5-325 MG PO TABS: 1.0000 | ORAL_TABLET | Freq: Four times a day (QID) | ORAL | 0 refills | Status: DC | PRN

## 2017-11-19 NOTE — Telephone Encounter (Signed)
Pt needs refill on her oxycodone 5/325  Please call when ready for pick up  [phone]  Thanks teri

## 2017-11-19 NOTE — Telephone Encounter (Signed)
NCCSR reviewed. Rx printed. 

## 2017-11-20 NOTE — Telephone Encounter (Signed)
Patient advised and reports that she will come in on Monday to pick up the prescription.Patient advised prescription placed up front ready for pick up.  Thanks,  -Joseline

## 2017-12-04 ENCOUNTER — Other Ambulatory Visit: Payer: Self-pay | Admitting: Physician Assistant

## 2017-12-04 DIAGNOSIS — IMO0002 Reserved for concepts with insufficient information to code with codable children: Secondary | ICD-10-CM

## 2017-12-04 DIAGNOSIS — E1142 Type 2 diabetes mellitus with diabetic polyneuropathy: Secondary | ICD-10-CM

## 2017-12-04 DIAGNOSIS — E1165 Type 2 diabetes mellitus with hyperglycemia: Principal | ICD-10-CM

## 2017-12-05 ENCOUNTER — Other Ambulatory Visit: Payer: Self-pay | Admitting: Internal Medicine

## 2017-12-05 DIAGNOSIS — E7800 Pure hypercholesterolemia, unspecified: Secondary | ICD-10-CM

## 2017-12-06 NOTE — Telephone Encounter (Signed)
Sent to [facility]

## 2017-12-06 NOTE — Telephone Encounter (Signed)
No, this pt was lost for f/u with me.

## 2017-12-06 NOTE — Telephone Encounter (Signed)
Is this okay to refill? 

## 2017-12-13 ENCOUNTER — Other Ambulatory Visit: Payer: Self-pay | Admitting: Physician Assistant

## 2017-12-13 DIAGNOSIS — G2581 Restless legs syndrome: Secondary | ICD-10-CM

## 2017-12-22 ENCOUNTER — Other Ambulatory Visit: Payer: Self-pay | Admitting: Physician Assistant

## 2017-12-22 DIAGNOSIS — M5137 Other intervertebral disc degeneration, lumbosacral region: Secondary | ICD-10-CM

## 2017-12-22 MED ORDER — OXYCODONE-ACETAMINOPHEN 5-325 MG PO TABS: 1.0000 | ORAL_TABLET | Freq: Four times a day (QID) | ORAL | 0 refills | Status: DC | PRN

## 2017-12-22 NOTE — Telephone Encounter (Signed)
NCCSR reviewed. Rx printed. 

## 2018-01-10 ENCOUNTER — Ambulatory Visit (INDEPENDENT_AMBULATORY_CARE_PROVIDER_SITE_OTHER): Payer: BLUE CROSS/BLUE SHIELD | Admitting: Family Medicine

## 2018-01-10 ENCOUNTER — Encounter: Payer: Self-pay | Admitting: Family Medicine

## 2018-01-10 VITALS — BP 126/80 | HR 98 | Temp 97.5°F | Resp 16 | Wt 232.8 lb

## 2018-01-10 DIAGNOSIS — J069 Acute upper respiratory infection, unspecified: Secondary | ICD-10-CM | POA: Diagnosis not present

## 2018-01-10 DIAGNOSIS — F439 Reaction to severe stress, unspecified: Secondary | ICD-10-CM

## 2018-01-10 MED ORDER — VENLAFAXINE HCL ER 150 MG PO TB24: 150.0000 mg | ORAL_TABLET | Freq: Every day | ORAL | 0 refills | Status: DC

## 2018-01-10 MED ORDER — BUSPIRONE HCL 15 MG PO TABS: ORAL_TABLET | ORAL | 0 refills | Status: DC
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
## 2018-01-10 NOTE — Patient Instructions (Signed)
Discussed use of Mucinex D for congestion, Delsym for cough, and Benadryl for postnasal drainage. Don't take sleeping medication with the Benadryl. Do f/u with [facility] as scheduled.

## 2018-01-11 ENCOUNTER — Encounter: Payer: Self-pay | Admitting: Family Medicine

## 2018-01-11 ENCOUNTER — Telehealth: Payer: Self-pay | Admitting: Family Medicine

## 2018-01-11 NOTE — Telephone Encounter (Signed)
Pt stated that she saw Mikki Santee for OV yesterday 01/10/18. Pt stated that she had to leave work early and forgot to request a note to excuse her from work yesterday afternoon. Pt is requesting that we make the work note available via MyChart so she can print it out from [employer]. Pt stated that she did return to work this morning. Please advise. Thanks TNP

## 2018-01-11 NOTE — Telephone Encounter (Signed)
I sent the work excuse message to her via My Chart. I suspect she may need a more formal note but will wait to hear from her.

## 2018-01-11 NOTE — Telephone Encounter (Signed)
Please review. KW 

## 2018-01-21 ENCOUNTER — Ambulatory Visit (INDEPENDENT_AMBULATORY_CARE_PROVIDER_SITE_OTHER): Payer: BLUE CROSS/BLUE SHIELD | Admitting: Physician Assistant

## 2018-01-21 ENCOUNTER — Encounter: Payer: Self-pay | Admitting: Physician Assistant

## 2018-01-21 VITALS — BP 126/80 | HR 104 | Temp 97.8°F | Resp 16 | Ht 71.0 in | Wt 226.0 lb

## 2018-01-21 DIAGNOSIS — E1142 Type 2 diabetes mellitus with diabetic polyneuropathy: Secondary | ICD-10-CM

## 2018-01-21 DIAGNOSIS — F419 Anxiety disorder, unspecified: Secondary | ICD-10-CM | POA: Diagnosis not present

## 2018-01-21 DIAGNOSIS — I10 Essential (primary) hypertension: Secondary | ICD-10-CM | POA: Diagnosis not present

## 2018-01-21 DIAGNOSIS — Z1211 Encounter for screening for malignant neoplasm of colon: Secondary | ICD-10-CM | POA: Diagnosis not present

## 2018-01-21 DIAGNOSIS — M5137 Other intervertebral disc degeneration, lumbosacral region: Secondary | ICD-10-CM | POA: Diagnosis not present

## 2018-01-21 DIAGNOSIS — M51379 Other intervertebral disc degeneration, lumbosacral region without mention of lumbar back pain or lower extremity pain: Secondary | ICD-10-CM

## 2018-01-21 DIAGNOSIS — Z0000 Encounter for general adult medical examination without abnormal findings: Secondary | ICD-10-CM | POA: Diagnosis not present

## 2018-01-21 DIAGNOSIS — Z1239 Encounter for other screening for malignant neoplasm of breast: Secondary | ICD-10-CM

## 2018-01-21 DIAGNOSIS — E7800 Pure hypercholesterolemia, unspecified: Secondary | ICD-10-CM | POA: Diagnosis not present

## 2018-01-21 DIAGNOSIS — Z114 Encounter for screening for human immunodeficiency virus [HIV]: Secondary | ICD-10-CM

## 2018-01-21 DIAGNOSIS — E1165 Type 2 diabetes mellitus with hyperglycemia: Secondary | ICD-10-CM

## 2018-01-21 DIAGNOSIS — Z124 Encounter for screening for malignant neoplasm of cervix: Secondary | ICD-10-CM

## 2018-01-21 DIAGNOSIS — Z1231 Encounter for screening mammogram for malignant neoplasm of breast: Secondary | ICD-10-CM

## 2018-01-21 DIAGNOSIS — IMO0002 Reserved for concepts with insufficient information to code with codable children: Secondary | ICD-10-CM

## 2018-01-21 MED ORDER — OXYCODONE-ACETAMINOPHEN 5-325 MG PO TABS: 1.0000 | ORAL_TABLET | Freq: Four times a day (QID) | ORAL | 0 refills | Status: DC | PRN

## 2018-01-21 NOTE — Progress Notes (Signed)
Patient: Gabriella Marsh, Female    DOB: December 20, 1966, 52 y.o.   MRN: [id number] Visit Date: 01/21/2018  Today's Provider: Mar Daring, PA-C   Chief Complaint  Patient presents with  . Annual Exam   Subjective:    Annual physical exam Gabriella Marsh is a 52 y.o. female who presents today for health maintenance and complete physical. She feels well. She reports exercising none. She reports she is sleeping well. 08/04/06 Pap-normal ----------------------------------------------------------------- Patient was seen on 01/10/18 by [facility] PA, and was advised to D/C Venlafaxine and start Buspirone. Patient reports she has not started Buspirone. Patient reports that since stopping Venlafaxine she has been having more leg cramps and mood swings.  Patient also C/O tingling on fingers on and off.   Review of Systems  Constitutional: Positive for activity change.  HENT: Positive for sinus pressure.   Eyes: Negative.   Respiratory: Negative.   Cardiovascular: Negative.   Gastrointestinal: Negative.   Endocrine: Negative.   Genitourinary: Negative.   Musculoskeletal: Positive for arthralgias, back pain and myalgias.  Skin: Negative.   Allergic/Immunologic: Negative.   Neurological: Positive for numbness.  Hematological: Negative.   Psychiatric/Behavioral: Positive for decreased concentration and dysphoric mood. The patient is nervous/anxious.     Social History      She  reports that she has quit smoking. she has never used smokeless tobacco.       Social History   Socioeconomic History  . Marital status: Married    Spouse name: None  . Number of children: 1  . Years of education: H/S  . Highest education level: None  Social Needs  . Financial resource strain: None  . Food insecurity - worry: None  . Food insecurity - inability: None  . Transportation needs - medical: None  . Transportation needs - non-medical: None  Occupational History  . None  Tobacco Use    . Smoking status: Former Research scientist (life sciences)  . Smokeless tobacco: Never Used  Substance and Sexual Activity  . Alcohol use: None  . Drug use: None  . Sexual activity: None  Other Topics Concern  . None  Social History Narrative  . None    Past Medical History:  Diagnosis Date  . Diabetes mellitus without complication ([place])   . Hyperlipidemia   . Hypertension      Patient Active Problem List   Diagnosis Date Noted  . Benign positional vertigo 02/03/2016  . Uncontrolled type 2 diabetes mellitus with peripheral neuropathy ([place]) 07/22/2015  . Common migraine with intractable migraine 06/18/2015  . Anxiety 05/04/2015  . Adaptation reaction 05/03/2015  . DDD (degenerative disc disease), lumbosacral 05/03/2015  . Hypercholesteremia 05/03/2015  . Insomnia due to medical condition 05/03/2015  . Adiposity 05/03/2015  . Disorder of peripheral nervous system 05/03/2015  . Snores 05/03/2015  . Diabetic neuropathy ([place]) 03/06/2015  . Essential hypertension 03/06/2015  . Elevated liver function tests 03/06/2015  . Obesity 03/06/2015  . GERD (gastroesophageal reflux disease) 03/06/2015  . Multinodular goiter 03/06/2015  . RLS (restless legs syndrome) 03/06/2015  . Leg cramps 03/06/2015    Past Surgical History:  Procedure Laterality Date  . ABDOMINAL HYSTERECTOMY     But still has cervix  . CESAREAN SECTION    . CHOLECYSTECTOMY    . OVARIAN CYST SURGERY    . SHOULDER SURGERY Left   . TONSILLECTOMY AND ADENOIDECTOMY    . UTERINE FIBROID SURGERY      Family History  Family Status  Relation Name Status  . Father  Deceased at age 81  . Brother  Alive  . Mother  (Not Specified)  . MGM  (Not Specified)        Her family history includes Congestive Heart Failure in her father; Diabetes in her father; Healthy in her brother; Heart disease in her father; Hyperlipidemia in her father; Hypertension in her father; Irritable bowel syndrome in her mother; Osteoporosis in her maternal  grandmother and mother.     Allergies  Allergen Reactions  . Pregabalin Shortness Of Breath  . Celecoxib Other (See Comments)    Unknown Other reaction(s): Unknown     Current Outpatient Medications:  .  atorvastatin (LIPITOR) 20 MG tablet, TAKE 1 TABLET DAILY, Disp: 90 tablet, Rfl: 3 .  gabapentin (NEURONTIN) 100 MG capsule, TAKE 2 CAPSULES THREE TIMES A DAY, ALONG WITH 600 MG THREE TIMES A DAY FOR A TOTAL DAILY DOSE OF 2400 MG DAILY, Disp: 540 capsule, Rfl: 1 .  gabapentin (NEURONTIN) 600 MG tablet, Take 1 tablet (600 mg total) by mouth 3 (three) times daily., Disp: 21 tablet, Rfl: 0 .  glipiZIDE (GLUCOTROL) 10 MG tablet, Take 0.5 tablets (5 mg total) by mouth 2 (two) times daily before a meal., Disp: 90 tablet, Rfl: 3 .  glucose blood test strip, Check blood sugars twice daily. OneTouch Verio strips., Disp: 200 each, Rfl: 5 .  Insulin Pen Needle (CAREFINE PEN NEEDLES) 32G X 4 MM MISC, Use 1x a day, Disp: 50 each, Rfl: 11 .  Melatonin 3 MG TABS, Take 1 tablet by mouth at bedtime., Disp: , Rfl:  .  metFORMIN (GLUMETZA) 1000 MG (MOD) 24 hr tablet, Take by mouth., Disp: , Rfl:  .  Multiple Vitamin (MULTI-VITAMINS) TABS, Take by mouth., Disp: , Rfl:  .  Multiple Vitamins-Minerals (MULTIVITAMIN ADULT PO), Take by mouth., Disp: , Rfl:  .  nortriptyline (PAMELOR) 25 MG capsule, TAKE 2 CAPSULES AT BEDTIME, Disp: 180 capsule, Rfl: 1 .  omeprazole (PRILOSEC) 20 MG capsule, Take 1 capsule (20 mg total) by mouth daily., Disp: 30 capsule, Rfl: 3 .  oxyCODONE-acetaminophen (PERCOCET/ROXICET) 5-325 MG tablet, Take 1 tablet by mouth every 6 (six) hours as needed for severe pain., Disp: 60 tablet, Rfl: 0 .  triamterene-hydrochlorothiazide (MAXZIDE-25) 37.5-25 MG tablet, Take 1 tablet by mouth daily., Disp: 90 tablet, Rfl: 3 .  busPIRone (BUSPAR) 15 MG tablet, Take 1/2 pill twice daily (Patient not taking: Reported on 01/21/2018), Disp: 30 tablet, Rfl: 0 .  Venlafaxine HCl 150 MG TB24, Take 1 tablet (150 mg  total) by mouth daily. (Patient not taking: Reported on 01/21/2018), Disp: 30 each, Rfl: 0   Patient Care Team: Mar Daring, PA-C as PCP - General (Family Medicine)      Objective:   Vitals: BP 126/80 (BP Location: Left Arm, Patient Position: Sitting, Cuff Size: Large)   Pulse (!) 104   Temp 97.8 F (36.6 C) (Oral)   Resp 16   Ht 5\' 11"  (1.803 m)   Wt 226 lb (102.5 kg)   SpO2 98%   BMI 31.52 kg/m    Vitals:   01/21/18 0914  BP: 126/80  Pulse: (!) 104  Resp: 16  Temp: 97.8 F (36.6 C)  TempSrc: Oral  SpO2: 98%  Weight: 226 lb (102.5 kg)  Height: 5\' 11"  (1.803 m)     Physical Exam  Constitutional: She is oriented to person, place, and time. She appears well-developed and well-nourished. No distress.  HENT:  Head: Normocephalic and atraumatic.  Right Ear: Hearing, tympanic membrane, external ear and ear canal normal.  Left Ear: Hearing, tympanic membrane, external ear and ear canal normal.  Nose: Nose normal.  Mouth/Throat: Uvula is midline, oropharynx is clear and moist and mucous membranes are normal. No oropharyngeal exudate.  Eyes: Conjunctivae and EOM are normal. Pupils are equal, round, and reactive to light. Right eye exhibits no discharge. Left eye exhibits no discharge. No scleral icterus.  Neck: Normal range of motion. Neck supple. No JVD present. Carotid bruit is not present. No tracheal deviation present. No thyromegaly present.  Cardiovascular: Normal rate, regular rhythm, normal heart sounds and intact distal pulses. Exam reveals no gallop and no friction rub.  No murmur heard. Pulmonary/Chest: Effort normal and breath sounds normal. No respiratory distress. She has no wheezes. She has no rales. She exhibits no tenderness. Right breast exhibits no inverted nipple, no mass, no nipple discharge, no skin change and no tenderness. Left breast exhibits no inverted nipple, no mass, no nipple discharge, no skin change and no tenderness. Breasts are symmetrical.    Abdominal: Soft. Bowel sounds are normal. She exhibits no distension and no mass. There is no tenderness. There is no rebound and no guarding. Hernia confirmed negative in the right inguinal area and confirmed negative in the left inguinal area.  Genitourinary: Rectum normal and vagina normal. No breast swelling, tenderness, discharge or bleeding. Pelvic exam was performed with patient supine. There is no rash, tenderness, lesion or injury on the right labia. There is no rash, tenderness, lesion or injury on the left labia. Cervix exhibits no motion tenderness, no discharge and no friability. Right adnexum displays no mass, no tenderness and no fullness. Left adnexum displays no mass, no tenderness and no fullness. No erythema, tenderness or bleeding in the vagina. No signs of injury around the vagina. No vaginal discharge found.  Genitourinary Comments: Uterus surgically absent, cervix still intact  Musculoskeletal: Normal range of motion. She exhibits no edema or tenderness.  Lymphadenopathy:    She has no cervical adenopathy.       Right: No inguinal adenopathy present.       Left: No inguinal adenopathy present.  Neurological: She is alert and oriented to person, place, and time. She has normal reflexes. No cranial nerve deficit. Coordination normal.  Skin: Skin is warm and dry. No rash noted. She is not diaphoretic.  Psychiatric: She has a normal mood and affect. Her behavior is normal. Judgment and thought content normal.  Vitals reviewed.    Depression Screen PHQ 2/9 Scores 01/21/2018 10/21/2017  PHQ - 2 Score 2 0  PHQ- 9 Score 11 7   Diabetic Foot Exam - Simple   Simple Foot Form Diabetic Foot exam was performed with the following findings:  Yes 01/21/2018 11:03 AM  Visual Inspection No deformities, no ulcerations, no other skin breakdown bilaterally:  Yes Sensation Testing See comments:  Yes Pulse Check Posterior Tibialis and Dorsalis pulse intact bilaterally:   Yes Comments Bilateral sensory numbness to heels bilaterally     Assessment & Plan:     Routine Health Maintenance and Physical Exam  Exercise Activities and Dietary recommendations Goals    None      Immunization History  Administered Date(s) Administered  . Influenza Split 11/04/2014  . Influenza,inj,Quad PF,6+ Mos 11/01/2015, 11/11/2016  . Pneumococcal Polysaccharide-23 07/25/2012  . Tdap 07/25/2012    Health Maintenance  Topic Date Due  . HIV Screening  04/03/1982  . PAP SMEAR  08/04/2009  . MAMMOGRAM  04/03/2017  . COLONOSCOPY  04/03/2017  . PNEUMOCOCCAL POLYSACCHARIDE VACCINE (2) 07/25/2017  . FOOT EXAM  02/11/2018  . OPHTHALMOLOGY EXAM  02/24/2018  . HEMOGLOBIN A1C  04/20/2018  . URINE MICROALBUMIN  10/21/2018  . TETANUS/TDAP  07/25/2022  . INFLUENZA VACCINE  Completed     Discussed health benefits of physical activity, and encouraged her to engage in regular exercise appropriate for her age and condition.    1. Annual physical exam Normal physical exam today. Will check labs as below and f/u pending lab results. If labs are stable and WNL she will not need to have these rechecked for one year at her next annual physical exam. She is to call the office in the meantime if she has any acute issue, questions or concerns. - TSH  2. Breast cancer screening Breast exam today was normal. There is no family history of breast cancer. She does perform regular self breast exams. Mammogram was ordered as below. Information for [facility] was given to patient so she may schedule her mammogram at her convenience. - MM DIGITAL SCREENING BILATERAL; Future  3. Colon cancer screening No previous colonoscopy. Ordered as below.  - Ambulatory referral to Gastroenterology  4. Cervical cancer screening Pap collected today. Will send as below and f/u pending results. - Pap IG and HPV (high risk) DNA detection  5. Uncontrolled type 2 diabetes mellitus with  peripheral neuropathy ([facility]) Stable. Continue metformin XR 1000mg  daily, glipizide 10mg . Will check labs as below and f/u pending results. I will see her back in 3 months for recheck A1c.  - Hemoglobin A1c - TSH  6. Essential hypertension Stable. Continue Maxzide 37.5-25mg . Will check labs as below and f/u pending results. - CBC with Differential/Platelet - Comprehensive metabolic panel - TSH  7. Hypercholesteremia Stable on atorvastatin 20mg . Will check labs as below and f/u pending results. - Lipid Panel With LDL/HDL Ratio  8. DDD (degenerative disc disease), lumbosacral Stable. Diagnosis pulled for medication refill. Continue current medical treatment plan. - oxyCODONE-acetaminophen (PERCOCET/ROXICET) 5-325 MG tablet; Take 1 tablet by mouth every 6 (six) hours as needed for severe pain.  Dispense: 60 tablet; Refill: 0  9. Screening for HIV without presence of risk factors - HIV antibody (with reflex)  10. Anxiety Continue to stay off venlafaxine at this time. Start Buspar as prescribed previously and call when refills needed if working well.   --------------------------------------------------------------------    Mar Daring, PA-C  [place]

## 2018-01-21 NOTE — Patient Instructions (Signed)
Health Maintenance for Postmenopausal Women Menopause is a normal process in which your reproductive ability comes to an end. This process happens gradually over a span of months to years, usually between the ages of 22 and 9. Menopause is complete when you have missed 12 consecutive menstrual periods. It is important to talk with your health care provider about some of the most common conditions that affect postmenopausal women, such as heart disease, cancer, and bone loss (osteoporosis). Adopting a healthy lifestyle and getting preventive care can help to promote your health and wellness. Those actions can also lower your chances of developing some of these common conditions. What should I know about menopause? During menopause, you may experience a number of symptoms, such as:  Moderate-to-severe hot flashes.  Night sweats.  Decrease in sex drive.  Mood swings.  Headaches.  Tiredness.  Irritability.  Memory problems.  Insomnia.  Choosing to treat or not to treat menopausal changes is an individual decision that you make with your health care provider. What should I know about hormone replacement therapy and supplements? Hormone therapy products are effective for treating symptoms that are associated with menopause, such as hot flashes and night sweats. Hormone replacement carries certain risks, especially as you become older. If you are thinking about using estrogen or estrogen with progestin treatments, discuss the benefits and risks with your health care provider. What should I know about heart disease and stroke? Heart disease, heart attack, and stroke become more likely as you age. This may be due, in part, to the hormonal changes that your body experiences during menopause. These can affect how your body processes dietary fats, triglycerides, and cholesterol. Heart attack and stroke are both medical emergencies. There are many things that you can do to help prevent heart disease  and stroke:  Have your blood pressure checked at least every 1-2 years. High blood pressure causes heart disease and increases the risk of stroke.  If you are 53-22 years old, ask your health care provider if you should take aspirin to prevent a heart attack or a stroke.  Do not use any tobacco products, including cigarettes, chewing tobacco, or electronic cigarettes. If you need help quitting, ask your health care provider.  It is important to eat a healthy diet and maintain a healthy weight. ? Be sure to include plenty of vegetables, fruits, low-fat dairy products, and lean protein. ? Avoid eating foods that are high in solid fats, added sugars, or salt (sodium).  Get regular exercise. This is one of the most important things that you can do for your health. ? Try to exercise for at least 150 minutes each week. The type of exercise that you do should increase your heart rate and make you sweat. This is known as moderate-intensity exercise. ? Try to do strengthening exercises at least twice each week. Do these in addition to the moderate-intensity exercise.  Know your numbers.Ask your health care provider to check your cholesterol and your blood glucose. Continue to have your blood tested as directed by your health care provider.  What should I know about cancer screening? There are several types of cancer. Take the following steps to reduce your risk and to catch any cancer development as early as possible. Breast Cancer  Practice breast self-awareness. ? This means understanding how your breasts normally appear and feel. ? It also means doing regular breast self-exams. Let your health care provider know about any changes, no matter how small.  If you are 40  or older, have a clinician do a breast exam (clinical breast exam or CBE) every year. Depending on your age, family history, and medical history, it may be recommended that you also have a yearly breast X-ray (mammogram).  If you  have a family history of breast cancer, talk with your health care provider about genetic screening.  If you are at high risk for breast cancer, talk with your health care provider about having an MRI and a mammogram every year.  Breast cancer (BRCA) gene test is recommended for women who have family members with BRCA-related cancers. Results of the assessment will determine the need for genetic counseling and BRCA1 and for BRCA2 testing. BRCA-related cancers include these types: ? Breast. This occurs in males or females. ? Ovarian. ? Tubal. This may also be called fallopian tube cancer. ? Cancer of the abdominal or pelvic lining (peritoneal cancer). ? Prostate. ? Pancreatic.  Cervical, Uterine, and Ovarian Cancer Your health care provider may recommend that you be screened regularly for cancer of the pelvic organs. These include your ovaries, uterus, and vagina. This screening involves a pelvic exam, which includes checking for microscopic changes to the surface of your cervix (Pap test).  For women ages 21-65, health care providers may recommend a pelvic exam and a Pap test every three years. For women ages 79-65, they may recommend the Pap test and pelvic exam, combined with testing for human papilloma virus (HPV), every five years. Some types of HPV increase your risk of cervical cancer. Testing for HPV may also be done on women of any age who have unclear Pap test results.  Other health care providers may not recommend any screening for nonpregnant women who are considered low risk for pelvic cancer and have no symptoms. Ask your health care provider if a screening pelvic exam is right for you.  If you have had past treatment for cervical cancer or a condition that could lead to cancer, you need Pap tests and screening for cancer for at least 20 years after your treatment. If Pap tests have been discontinued for you, your risk factors (such as having a new sexual partner) need to be  reassessed to determine if you should start having screenings again. Some women have medical problems that increase the chance of getting cervical cancer. In these cases, your health care provider may recommend that you have screening and Pap tests more often.  If you have a family history of uterine cancer or ovarian cancer, talk with your health care provider about genetic screening.  If you have vaginal bleeding after reaching menopause, tell your health care provider.  There are currently no reliable tests available to screen for ovarian cancer.  Lung Cancer Lung cancer screening is recommended for adults 69-62 years old who are at high risk for lung cancer because of a history of smoking. A yearly low-dose CT scan of the lungs is recommended if you:  Currently smoke.  Have a history of at least 30 pack-years of smoking and you currently smoke or have quit within the past 15 years. A pack-year is smoking an average of one pack of cigarettes per day for one year.  Yearly screening should:  Continue until it has been 15 years since you quit.  Stop if you develop a health problem that would prevent you from having lung cancer treatment.  Colorectal Cancer  This type of cancer can be detected and can often be prevented.  Routine colorectal cancer screening usually begins at  age 42 and continues through age 45.  If you have risk factors for colon cancer, your health care provider may recommend that you be screened at an earlier age.  If you have a family history of colorectal cancer, talk with your health care provider about genetic screening.  Your health care provider may also recommend using home test kits to check for hidden blood in your stool.  A small camera at the end of a tube can be used to examine your colon directly (sigmoidoscopy or colonoscopy). This is done to check for the earliest forms of colorectal cancer.  Direct examination of the colon should be repeated every  5-10 years until age 71. However, if early forms of precancerous polyps or small growths are found or if you have a family history or genetic risk for colorectal cancer, you may need to be screened more often.  Skin Cancer  Check your skin from head to toe regularly.  Monitor any moles. Be sure to tell your health care provider: ? About any new moles or changes in moles, especially if there is a change in a mole's shape or color. ? If you have a mole that is larger than the size of a pencil eraser.  If any of your family members has a history of skin cancer, especially at a young age, talk with your health care provider about genetic screening.  Always use sunscreen. Apply sunscreen liberally and repeatedly throughout the day.  Whenever you are outside, protect yourself by wearing long sleeves, pants, a wide-brimmed hat, and sunglasses.  What should I know about osteoporosis? Osteoporosis is a condition in which bone destruction happens more quickly than new bone creation. After menopause, you may be at an increased risk for osteoporosis. To help prevent osteoporosis or the bone fractures that can happen because of osteoporosis, the following is recommended:  If you are 46-71 years old, get at least 1,000 mg of calcium and at least 600 mg of vitamin D per day.  If you are older than age 55 but younger than age 65, get at least 1,200 mg of calcium and at least 600 mg of vitamin D per day.  If you are older than age 54, get at least 1,200 mg of calcium and at least 800 mg of vitamin D per day.  Smoking and excessive alcohol intake increase the risk of osteoporosis. Eat foods that are rich in calcium and vitamin D, and do weight-bearing exercises several times each week as directed by your health care provider. What should I know about how menopause affects my mental health? Depression may occur at any age, but it is more common as you become older. Common symptoms of depression  include:  Low or sad mood.  Changes in sleep patterns.  Changes in appetite or eating patterns.  Feeling an overall lack of motivation or enjoyment of activities that you previously enjoyed.  Frequent crying spells.  Talk with your health care provider if you think that you are experiencing depression. What should I know about immunizations? It is important that you get and maintain your immunizations. These include:  Tetanus, diphtheria, and pertussis (Tdap) booster vaccine.  Influenza every year before the flu season begins.  Pneumonia vaccine.  Shingles vaccine.  Your health care provider may also recommend other immunizations. This information is not intended to replace advice given to you by your health care provider. Make sure you discuss any questions you have with your health care provider. Document Released: 01/29/2006  Document Revised: 06/26/2016 Document Reviewed: 09/10/2015 Elsevier Interactive Patient Education  2018 [employer]  

## 2018-01-22 LAB — CBC WITH DIFFERENTIAL/PLATELET
Basophils Absolute: 0 10*3/uL (ref 0.0–0.2)
Basos: 0 %
EOS (ABSOLUTE): 0.2 10*3/uL (ref 0.0–0.4)
Eos: 1 %
HEMATOCRIT: 45.4 % (ref 34.0–46.6)
HEMOGLOBIN: 15.2 g/dL (ref 11.1–15.9)
IMMATURE GRANS (ABS): 0.1 10*3/uL (ref 0.0–0.1)
Immature Granulocytes: 1 %
Lymphocytes Absolute: 4.3 10*3/uL — ABNORMAL HIGH (ref 0.7–3.1)
Lymphs: 35 %
MCH: 28.6 pg (ref 26.6–33.0)
MCHC: 33.5 g/dL (ref 31.5–35.7)
MCV: 85 fL (ref 79–97)
MONOS ABS: 0.8 10*3/uL (ref 0.1–0.9)
Monocytes: 6 %
NEUTROS PCT: 57 %
Neutrophils Absolute: 6.9 10*3/uL (ref 1.4–7.0)
Platelets: 401 10*3/uL — ABNORMAL HIGH (ref 150–379)
RBC: 5.32 x10E6/uL — ABNORMAL HIGH (ref 3.77–5.28)
RDW: 13.3 % (ref 12.3–15.4)
WBC: 12.2 10*3/uL — AB (ref 3.4–10.8)

## 2018-01-22 LAB — COMPREHENSIVE METABOLIC PANEL
ALBUMIN: 4.3 g/dL (ref 3.5–5.5)
ALK PHOS: 95 IU/L (ref 39–117)
ALT: 50 IU/L — ABNORMAL HIGH (ref 0–32)
AST: 31 IU/L (ref 0–40)
Albumin/Globulin Ratio: 1.2 (ref 1.2–2.2)
BILIRUBIN TOTAL: 0.7 mg/dL (ref 0.0–1.2)
BUN / CREAT RATIO: 14 (ref 9–23)
BUN: 9 mg/dL (ref 6–24)
CHLORIDE: 91 mmol/L — AB (ref 96–106)
CO2: 23 mmol/L (ref 20–29)
Calcium: 10.1 mg/dL (ref 8.7–10.2)
Creatinine, Ser: 0.63 mg/dL (ref 0.57–1.00)
GFR calc non Af Amer: 105 mL/min/{1.73_m2} (ref >59)
GFR, EST AFRICAN AMERICAN: 121 mL/min/{1.73_m2} (ref >59)
GLOBULIN, TOTAL: 3.5 g/dL (ref 1.5–4.5)
Glucose: 289 mg/dL — ABNORMAL HIGH (ref 65–99)
Potassium: 4.3 mmol/L (ref 3.5–5.2)
SODIUM: 136 mmol/L (ref 134–144)
TOTAL PROTEIN: 7.8 g/dL (ref 6.0–8.5)

## 2018-01-22 LAB — HIV ANTIBODY (ROUTINE TESTING W REFLEX): HIV Screen 4th Generation wRfx: NONREACTIVE

## 2018-01-22 LAB — TSH: TSH: 1.22 u[IU]/mL (ref 0.450–4.500)

## 2018-01-22 LAB — HEMOGLOBIN A1C
Est. average glucose Bld gHb Est-mCnc: 258 mg/dL
Hgb A1c MFr Bld: 10.6 % — ABNORMAL HIGH (ref 4.8–5.6)

## 2018-01-22 LAB — LIPID PANEL WITH LDL/HDL RATIO
Cholesterol, Total: 236 mg/dL — ABNORMAL HIGH (ref 100–199)
HDL: 42 mg/dL (ref >39)
TRIGLYCERIDES: 435 mg/dL — AB (ref 0–149)

## 2018-01-24 ENCOUNTER — Telehealth: Payer: Self-pay

## 2018-01-24 NOTE — Telephone Encounter (Signed)
Patient advised as directed below. Per patient she is going to try to increase the medications and will give [place] a call to let us know.  Thanks,  -Joseline

## 2018-01-24 NOTE — Telephone Encounter (Signed)
-----   Message from Mar Daring, PA-C sent at 01/24/2018  1:13 PM EST ----- A1c up to 10.6 from 9.1. Cholesterol also up slightly but not a true fasting lab. WBC count also slightly increased indicating possible viral infection or stress response to elevated sugars. All other labs are normal. I would recommend to increase glipizide to 10mg  (one full tab) twice daily and possibly increase metformin to 2 tabs (2000mg ) for better control if tolerates increases. We can recheck in 3 months. If unable to tolerate maximizing dosages of these 2 medications may consider adding either a 3rd oral (jardiance or farxiga) or a weekly injectable like she has used in the past.

## 2018-01-25 LAB — PAP IG AND HPV HIGH-RISK
HPV, high-risk: NEGATIVE
PAP SMEAR COMMENT: 0

## 2018-01-26 ENCOUNTER — Telehealth: Payer: Self-pay

## 2018-01-26 NOTE — Telephone Encounter (Signed)
-----   Message from Mar Daring, [place] sent at 01/25/2018  8:16 PM EST ----- Pap is normal, HPV negative.  Will repeat in 3-5 years.

## 2018-01-26 NOTE — Telephone Encounter (Signed)
Patient advised as directed below.  Thanks,  -Anjelika Ausburn 

## 2018-02-01 ENCOUNTER — Telehealth: Payer: Self-pay

## 2018-02-01 ENCOUNTER — Other Ambulatory Visit: Payer: Self-pay

## 2018-02-01 DIAGNOSIS — Z1211 Encounter for screening for malignant neoplasm of colon: Secondary | ICD-10-CM

## 2018-02-01 NOTE — Telephone Encounter (Signed)
Gastroenterology Pre-Procedure Review  Request Date:   Requesting Physician: Dr. Allen Norris   PATIENT REVIEW QUESTIONS: The patient responded to the following health history questions as indicated:    1. Are you having any GI issues? No  2. Do you have a personal history of Polyps? No  3. Do you have a family history of Colon Cancer or Polyps? Yes, mother abnormal polyps, no cancer  4. Diabetes Mellitus? Yes  5. Joint replacements in the past 12 months? No  6. Major health problems in the past 3 months? No  7. Any artificial heart valves, MVP, or defibrillator? No     MEDICATIONS & ALLERGIES:    Patient reports the following regarding taking any anticoagulation/antiplatelet therapy:   Plavix, Coumadin, Eliquis, Xarelto, Lovenox, Pradaxa, Brilinta, or Effient? No  Aspirin? No   Patient confirms/reports the following medications:  Current Outpatient Medications  Medication Sig Dispense Refill  . atorvastatin (LIPITOR) 20 MG tablet TAKE 1 TABLET DAILY 90 tablet 3  . busPIRone (BUSPAR) 15 MG tablet Take 1/2 pill twice daily (Patient not taking: Reported on 01/21/2018) 30 tablet 0  . gabapentin (NEURONTIN) 100 MG capsule TAKE 2 CAPSULES THREE TIMES A DAY, ALONG WITH 600 MG THREE TIMES A DAY FOR A TOTAL DAILY DOSE OF 2400 MG DAILY 540 capsule 1  . gabapentin (NEURONTIN) 600 MG tablet Take 1 tablet (600 mg total) by mouth 3 (three) times daily. 21 tablet 0  . glipiZIDE (GLUCOTROL) 10 MG tablet Take 0.5 tablets (5 mg total) by mouth 2 (two) times daily before a meal. 90 tablet 3  . glucose blood test strip Check blood sugars twice daily. OneTouch Verio strips. 200 each 5  . Insulin Pen Needle (CAREFINE PEN NEEDLES) 32G X 4 MM MISC Use 1x a day 50 each 11  . Melatonin 3 MG TABS Take 1 tablet by mouth at bedtime.    . metFORMIN (GLUMETZA) 1000 MG (MOD) 24 hr tablet Take by mouth.    . Multiple Vitamin (MULTI-VITAMINS) TABS Take by mouth.    . Multiple Vitamins-Minerals (MULTIVITAMIN ADULT PO) Take by  mouth.    . nortriptyline (PAMELOR) 25 MG capsule TAKE 2 CAPSULES AT BEDTIME 180 capsule 1  . omeprazole (PRILOSEC) 20 MG capsule Take 1 capsule (20 mg total) by mouth daily. 30 capsule 3  . oxyCODONE-acetaminophen (PERCOCET/ROXICET) 5-325 MG tablet Take 1 tablet by mouth every 6 (six) hours as needed for severe pain. 60 tablet 0  . triamterene-hydrochlorothiazide (MAXZIDE-25) 37.5-25 MG tablet Take 1 tablet by mouth daily. 90 tablet 3   No current facility-administered medications for this visit.     Patient confirms/reports the following allergies:  Allergies  Allergen Reactions  . Pregabalin Shortness Of Breath  . Celecoxib Other (See Comments)    Unknown Other reaction(s): Unknown    No orders of the defined types were placed in this encounter.   AUTHORIZATION INFORMATION Primary Insurance: 1D#: Group #:  Secondary Insurance: 1D#: Group #:  SCHEDULE INFORMATION: Date: 03/04/18 Time: Location: [place]

## 2018-02-16 ENCOUNTER — Other Ambulatory Visit: Payer: Self-pay | Admitting: Physician Assistant

## 2018-02-16 DIAGNOSIS — M5137 Other intervertebral disc degeneration, lumbosacral region: Secondary | ICD-10-CM

## 2018-02-16 MED ORDER — OXYCODONE-ACETAMINOPHEN 5-325 MG PO TABS: 1.0000 | ORAL_TABLET | Freq: Four times a day (QID) | ORAL | 0 refills | Status: DC | PRN

## 2018-02-16 NOTE — Telephone Encounter (Signed)
NCCSR reviewed. Rx printed. 

## 2018-02-19 ENCOUNTER — Other Ambulatory Visit: Payer: Self-pay | Admitting: Physician Assistant

## 2018-02-19 DIAGNOSIS — IMO0002 Reserved for concepts with insufficient information to code with codable children: Secondary | ICD-10-CM

## 2018-02-19 DIAGNOSIS — E1142 Type 2 diabetes mellitus with diabetic polyneuropathy: Secondary | ICD-10-CM

## 2018-02-19 DIAGNOSIS — E1165 Type 2 diabetes mellitus with hyperglycemia: Principal | ICD-10-CM

## 2018-02-20 ENCOUNTER — Other Ambulatory Visit: Payer: Self-pay | Admitting: Family Medicine

## 2018-02-20 DIAGNOSIS — F439 Reaction to severe stress, unspecified: Secondary | ICD-10-CM

## 2018-02-21 NOTE — Telephone Encounter (Signed)
See refill request.

## 2018-02-28 ENCOUNTER — Encounter: Payer: Self-pay | Admitting: Anesthesiology

## 2018-02-28 ENCOUNTER — Other Ambulatory Visit: Payer: Self-pay

## 2018-02-28 ENCOUNTER — Encounter: Payer: Self-pay | Admitting: *Deleted

## 2018-03-09 ENCOUNTER — Encounter: Payer: Self-pay | Admitting: Physician Assistant

## 2018-03-09 DIAGNOSIS — M5137 Other intervertebral disc degeneration, lumbosacral region: Secondary | ICD-10-CM

## 2018-03-09 DIAGNOSIS — E1140 Type 2 diabetes mellitus with diabetic neuropathy, unspecified: Secondary | ICD-10-CM

## 2018-03-10 ENCOUNTER — Encounter: Payer: Self-pay | Admitting: Physician Assistant

## 2018-03-10 DIAGNOSIS — F439 Reaction to severe stress, unspecified: Secondary | ICD-10-CM

## 2018-03-10 DIAGNOSIS — I10 Essential (primary) hypertension: Secondary | ICD-10-CM

## 2018-03-10 MED ORDER — GABAPENTIN 600 MG PO TABS: 600.0000 mg | ORAL_TABLET | Freq: Three times a day (TID) | ORAL | 0 refills | Status: DC

## 2018-03-10 MED ORDER — OXYCODONE-ACETAMINOPHEN 5-325 MG PO TABS: 1.0000 | ORAL_TABLET | Freq: Four times a day (QID) | ORAL | 0 refills | Status: DC | PRN

## 2018-03-10 MED ORDER — TRIAMTERENE-HCTZ 37.5-25 MG PO TABS: 1.0000 | ORAL_TABLET | Freq: Every day | ORAL | 3 refills | Status: DC

## 2018-03-10 MED ORDER — BUSPIRONE HCL 15 MG PO TABS
ORAL_TABLET | ORAL | 1 refills | Status: DC
Start: 2018-03-10 — End: 2018-09-17

## 2018-03-17 ENCOUNTER — Encounter: Payer: Self-pay | Admitting: Physician Assistant

## 2018-03-17 ENCOUNTER — Other Ambulatory Visit: Payer: Self-pay | Admitting: Physician Assistant

## 2018-03-17 DIAGNOSIS — M5137 Other intervertebral disc degeneration, lumbosacral region: Secondary | ICD-10-CM

## 2018-03-19 ENCOUNTER — Other Ambulatory Visit: Payer: Self-pay | Admitting: Physician Assistant

## 2018-03-19 DIAGNOSIS — E1140 Type 2 diabetes mellitus with diabetic neuropathy, unspecified: Secondary | ICD-10-CM

## 2018-03-21 ENCOUNTER — Encounter: Payer: Self-pay | Admitting: Physician Assistant

## 2018-03-21 MED ORDER — GABAPENTIN 600 MG PO TABS: 600.0000 mg | ORAL_TABLET | Freq: Three times a day (TID) | ORAL | 1 refills | Status: DC

## 2018-04-22 ENCOUNTER — Encounter: Payer: Self-pay | Admitting: Physician Assistant

## 2018-04-22 DIAGNOSIS — M5137 Other intervertebral disc degeneration, lumbosacral region: Secondary | ICD-10-CM

## 2018-04-25 MED ORDER — OXYCODONE-ACETAMINOPHEN 5-325 MG PO TABS: 1.0000 | ORAL_TABLET | Freq: Four times a day (QID) | ORAL | 0 refills | Status: DC | PRN

## 2018-04-25 NOTE — Telephone Encounter (Signed)
[place] reviewed. No red flags. Rx sent to [facility]

## 2018-05-06 ENCOUNTER — Other Ambulatory Visit: Payer: Self-pay | Admitting: Physician Assistant

## 2018-05-06 DIAGNOSIS — E1142 Type 2 diabetes mellitus with diabetic polyneuropathy: Secondary | ICD-10-CM

## 2018-05-06 DIAGNOSIS — E1165 Type 2 diabetes mellitus with hyperglycemia: Principal | ICD-10-CM

## 2018-05-06 DIAGNOSIS — IMO0002 Reserved for concepts with insufficient information to code with codable children: Secondary | ICD-10-CM

## 2018-05-17 ENCOUNTER — Encounter: Payer: Self-pay | Admitting: Physician Assistant

## 2018-05-17 DIAGNOSIS — E1140 Type 2 diabetes mellitus with diabetic neuropathy, unspecified: Secondary | ICD-10-CM

## 2018-05-17 MED ORDER — GABAPENTIN 600 MG PO TABS: 600.0000 mg | ORAL_TABLET | Freq: Three times a day (TID) | ORAL | 1 refills | Status: DC

## 2018-05-26 ENCOUNTER — Encounter: Payer: Self-pay | Admitting: Physician Assistant

## 2018-05-26 DIAGNOSIS — I10 Essential (primary) hypertension: Secondary | ICD-10-CM

## 2018-05-26 MED ORDER — TRIAMTERENE-HCTZ 37.5-25 MG PO TABS: 1.0000 | ORAL_TABLET | Freq: Every day | ORAL | 3 refills | Status: DC

## 2018-05-26 MED ORDER — TRIAMTERENE-HCTZ 37.5-25 MG PO TABS: 1.0000 | ORAL_TABLET | Freq: Every day | ORAL | 0 refills | Status: DC

## 2018-06-07 ENCOUNTER — Other Ambulatory Visit: Payer: Self-pay | Admitting: Physician Assistant

## 2018-06-07 DIAGNOSIS — I10 Essential (primary) hypertension: Secondary | ICD-10-CM

## 2018-06-09 ENCOUNTER — Encounter: Payer: Self-pay | Admitting: Physician Assistant

## 2018-06-09 DIAGNOSIS — M5137 Other intervertebral disc degeneration, lumbosacral region: Secondary | ICD-10-CM

## 2018-06-09 MED ORDER — OXYCODONE-ACETAMINOPHEN 5-325 MG PO TABS: 1.0000 | ORAL_TABLET | Freq: Four times a day (QID) | ORAL | 0 refills | Status: DC | PRN

## 2018-06-09 NOTE — Telephone Encounter (Signed)
[facility] reviewed. 

## 2018-07-06 ENCOUNTER — Other Ambulatory Visit: Payer: Self-pay | Admitting: Physician Assistant

## 2018-07-06 DIAGNOSIS — M5137 Other intervertebral disc degeneration, lumbosacral region: Secondary | ICD-10-CM

## 2018-07-07 MED ORDER — OXYCODONE-ACETAMINOPHEN 5-325 MG PO TABS: 1.0000 | ORAL_TABLET | Freq: Four times a day (QID) | ORAL | 0 refills | Status: DC | PRN

## 2018-07-07 NOTE — Telephone Encounter (Signed)
[facility] reviewed. 

## 2018-07-08 ENCOUNTER — Ambulatory Visit
Admission: RE | Admit: 2018-07-08 | Payer: BLUE CROSS/BLUE SHIELD | Source: Ambulatory Visit | Admitting: Gastroenterology

## 2018-07-08 HISTORY — DX: Unspecified osteoarthritis, unspecified site: M19.90

## 2018-07-08 HISTORY — DX: Other intervertebral disc degeneration, lumbar region: M51.36

## 2018-07-08 HISTORY — DX: Headache: R51

## 2018-07-08 HISTORY — DX: Gastro-esophageal reflux disease without esophagitis: K21.9

## 2018-07-08 HISTORY — DX: Dizziness and giddiness: R42

## 2018-07-08 HISTORY — DX: Polyneuropathy, unspecified: G62.9

## 2018-07-08 HISTORY — DX: Headache, unspecified: R51.9

## 2018-07-08 HISTORY — DX: Other intervertebral disc degeneration, lumbar region without mention of lumbar back pain or lower extremity pain: M51.369

## 2018-07-08 SURGERY — COLONOSCOPY WITH PROPOFOL
Anesthesia: Choice

## 2018-07-24 ENCOUNTER — Other Ambulatory Visit: Payer: Self-pay | Admitting: Physician Assistant

## 2018-07-24 DIAGNOSIS — G2581 Restless legs syndrome: Secondary | ICD-10-CM

## 2018-08-15 ENCOUNTER — Encounter: Payer: Self-pay | Admitting: Physician Assistant

## 2018-08-15 DIAGNOSIS — M5137 Other intervertebral disc degeneration, lumbosacral region: Secondary | ICD-10-CM

## 2018-08-15 MED ORDER — OXYCODONE-ACETAMINOPHEN 5-325 MG PO TABS: 1.0000 | ORAL_TABLET | Freq: Four times a day (QID) | ORAL | 0 refills | Status: DC | PRN

## 2018-09-17 ENCOUNTER — Other Ambulatory Visit: Payer: Self-pay | Admitting: Physician Assistant

## 2018-09-17 DIAGNOSIS — F439 Reaction to severe stress, unspecified: Secondary | ICD-10-CM

## 2018-09-19 ENCOUNTER — Other Ambulatory Visit: Payer: Self-pay | Admitting: Physician Assistant

## 2018-09-19 DIAGNOSIS — M5137 Other intervertebral disc degeneration, lumbosacral region: Secondary | ICD-10-CM

## 2018-09-19 MED ORDER — OXYCODONE-ACETAMINOPHEN 5-325 MG PO TABS: 1.0000 | ORAL_TABLET | Freq: Four times a day (QID) | ORAL | 0 refills | Status: DC | PRN

## 2018-09-19 NOTE — Telephone Encounter (Signed)
[facility] reviewed. 

## 2018-10-21 ENCOUNTER — Other Ambulatory Visit: Payer: Self-pay | Admitting: Physician Assistant

## 2018-10-21 DIAGNOSIS — M5137 Other intervertebral disc degeneration, lumbosacral region: Secondary | ICD-10-CM

## 2018-10-21 MED ORDER — OXYCODONE-ACETAMINOPHEN 5-325 MG PO TABS: 1.0000 | ORAL_TABLET | Freq: Four times a day (QID) | ORAL | 0 refills | Status: DC | PRN

## 2018-10-21 NOTE — Telephone Encounter (Signed)
Pt called to confirm we had her mychart request for her Oxycodone.

## 2018-10-21 NOTE — Telephone Encounter (Signed)
Pt calling back to check on status.  Needing refill if possible for the weekend.  Thanks, [employer]

## 2018-10-26 ENCOUNTER — Encounter: Payer: Self-pay | Admitting: Physician Assistant

## 2018-10-26 DIAGNOSIS — M5137 Other intervertebral disc degeneration, lumbosacral region: Secondary | ICD-10-CM

## 2018-10-26 MED ORDER — OXYCODONE-ACETAMINOPHEN 5-325 MG PO TABS: 1.0000 | ORAL_TABLET | Freq: Four times a day (QID) | ORAL | 0 refills | Status: DC | PRN

## 2018-11-02 ENCOUNTER — Other Ambulatory Visit: Payer: Self-pay | Admitting: Physician Assistant

## 2018-11-02 ENCOUNTER — Ambulatory Visit: Payer: BLUE CROSS/BLUE SHIELD | Admitting: Physician Assistant

## 2018-11-02 DIAGNOSIS — IMO0002 Reserved for concepts with insufficient information to code with codable children: Secondary | ICD-10-CM

## 2018-11-02 DIAGNOSIS — E1142 Type 2 diabetes mellitus with diabetic polyneuropathy: Secondary | ICD-10-CM

## 2018-11-02 DIAGNOSIS — E1165 Type 2 diabetes mellitus with hyperglycemia: Principal | ICD-10-CM

## 2018-11-08 ENCOUNTER — Other Ambulatory Visit: Payer: Self-pay | Admitting: Physician Assistant

## 2018-11-08 DIAGNOSIS — E1140 Type 2 diabetes mellitus with diabetic neuropathy, unspecified: Secondary | ICD-10-CM

## 2018-11-14 ENCOUNTER — Encounter: Payer: Self-pay | Admitting: Physician Assistant

## 2018-11-14 ENCOUNTER — Ambulatory Visit (INDEPENDENT_AMBULATORY_CARE_PROVIDER_SITE_OTHER): Payer: BLUE CROSS/BLUE SHIELD | Admitting: Physician Assistant

## 2018-11-14 VITALS — BP 140/80 | HR 102 | Temp 97.2°F | Resp 16 | Wt 225.2 lb

## 2018-11-14 DIAGNOSIS — R233 Spontaneous ecchymoses: Secondary | ICD-10-CM

## 2018-11-14 MED ORDER — TRIAMCINOLONE ACETONIDE 0.1 % EX CREA: 1.0000 "application " | TOPICAL_CREAM | Freq: Two times a day (BID) | CUTANEOUS | 0 refills | Status: DC

## 2018-11-14 NOTE — Progress Notes (Signed)
Patient: Gabriella Marsh Female    DOB: 07/31/1966   52 y.o.   MRN: [id number] Visit Date: 11/14/2018  Today's Provider: Mar Daring, PA-C   No chief complaint on file.  Subjective:    HPI Patient reports that she soaked her left foot yesterday and reports that she used chronic pain and fatigue body soak and foot started to get red in a styrofoam container. Reports feeling no pain on her foot. She has like a petechia rash on it. No blister. Reports she feels like it was a chemical reaction with the soak and the styrofoam.     Allergies  Allergen Reactions  . Celecoxib Shortness Of Breath  . Pregabalin Shortness Of Breath     Current Outpatient Medications:  .  atorvastatin (LIPITOR) 20 MG tablet, TAKE 1 TABLET DAILY, Disp: 90 tablet, Rfl: 3 .  busPIRone (BUSPAR) 15 MG tablet, TAKE ONE-HALF (1/2) TABLET TWICE A DAY, Disp: 90 tablet, Rfl: 4 .  gabapentin (NEURONTIN) 100 MG capsule, TAKE 2 CAPSULES THREE TIMES A DAY, ALONG WITH 600 MG THREE TIMES A DAY FOR A TOTAL DAILY DOSE OF 2400 MG DAILY, Disp: 540 capsule, Rfl: 1 .  gabapentin (NEURONTIN) 600 MG tablet, TAKE 1 TABLET THREE TIMES A DAY, Disp: 270 tablet, Rfl: 4 .  glipiZIDE (GLUCOTROL) 10 MG tablet, TAKE ONE-HALF (1/2) TABLET TWICE A DAY BEFORE MEALS, Disp: 90 tablet, Rfl: 3 .  glucose blood test strip, Check blood sugars twice daily. OneTouch Verio strips., Disp: 200 each, Rfl: 5 .  Insulin Pen Needle (CAREFINE PEN NEEDLES) 32G X 4 MM MISC, Use 1x a day, Disp: 50 each, Rfl: 11 .  Melatonin 3 MG TABS, Take 1 tablet by mouth at bedtime., Disp: , Rfl:  .  metFORMIN (GLUCOPHAGE-XR) 500 MG 24 hr tablet, TAKE 2 TABLETS TWICE A DAY WITH MEALS, Disp: 360 tablet, Rfl: 4 .  Multiple Vitamin (MULTI-VITAMINS) TABS, Take by mouth., Disp: , Rfl:  .  Multiple Vitamins-Minerals (MULTIVITAMIN ADULT PO), Take by mouth., Disp: , Rfl:  .  nortriptyline (PAMELOR) 25 MG capsule, TAKE 2 CAPSULES AT BEDTIME, Disp: 180 capsule, Rfl: 1 .   omeprazole (PRILOSEC) 20 MG capsule, Take 1 capsule (20 mg total) by mouth daily., Disp: 30 capsule, Rfl: 3 .  oxyCODONE-acetaminophen (PERCOCET/ROXICET) 5-325 MG tablet, Take 1 tablet by mouth every 6 (six) hours as needed for severe pain., Disp: 120 tablet, Rfl: 0 .  triamterene-hydrochlorothiazide (MAXZIDE-25) 37.5-25 MG tablet, TAKE 1 TABLET BY MOUTH DAILY, Disp: 90 tablet, Rfl: 1  Review of Systems  Constitutional: Negative.   Respiratory: Negative.   Cardiovascular: Negative.   Gastrointestinal: Negative.   Skin: Positive for color change. Negative for pallor, rash and wound.    Social History   Tobacco Use  . Smoking status: Former Smoker    Last attempt to quit: 2013    Years since quitting: 6.9  . Smokeless tobacco: Never Used  Substance Use Topics  . Alcohol use: No    Alcohol/week: 0.0 standard drinks    Frequency: Never   Objective:   BP 140/80 (BP Location: Left Arm, Patient Position: Sitting, Cuff Size: Large)   Pulse (!) 102   Temp (!) 97.2 F (36.2 C) (Oral)   Resp 16   Wt 225 lb 3.2 oz (102.2 kg)   BMI 31.41 kg/m  Vitals:   11/14/18 1522  BP: 140/80  Pulse: (!) 102  Resp: 16  Temp: (!) 97.2 F (36.2 C)  TempSrc:  Oral  Weight: 225 lb 3.2 oz (102.2 kg)     Physical Exam  Constitutional: She appears well-developed and well-nourished. No distress.  Neck: Normal range of motion. Neck supple.  Cardiovascular: Normal rate, regular rhythm and normal heart sounds. Exam reveals no gallop and no friction rub.  No murmur heard. Pulmonary/Chest: Effort normal and breath sounds normal. No respiratory distress. She has no wheezes. She has no rales.  Skin: Petechiae (see photo) noted. She is not diaphoretic.  Vitals reviewed.             Assessment & Plan:     1. Petechial rash Will give triamcinolone cream as below to calm down inflammation of the superficial capillaries. Will try to avoid oral steroids due to not well controlled diabetes. She is to  call if redness worsens, warmth and pain develop or if she develops any blisters, or any other signs of infection. She agrees.  - triamcinolone cream (KENALOG) 0.1 %; Apply 1 application topically 2 (two) times daily.  Dispense: 30 g; Refill: 0       Mar Daring, PA-C  [facility]

## 2018-11-15 ENCOUNTER — Encounter: Payer: Self-pay | Admitting: Physician Assistant

## 2018-11-19 ENCOUNTER — Encounter: Payer: Self-pay | Admitting: Physician Assistant

## 2018-11-24 ENCOUNTER — Other Ambulatory Visit: Payer: Self-pay | Admitting: Physician Assistant

## 2018-11-24 DIAGNOSIS — M5137 Other intervertebral disc degeneration, lumbosacral region: Secondary | ICD-10-CM

## 2018-11-25 MED ORDER — OXYCODONE-ACETAMINOPHEN 5-325 MG PO TABS: 1.0000 | ORAL_TABLET | Freq: Four times a day (QID) | ORAL | 0 refills | Status: DC | PRN

## 2018-11-28 ENCOUNTER — Encounter: Payer: Self-pay | Admitting: Physician Assistant

## 2018-11-28 ENCOUNTER — Ambulatory Visit: Payer: BLUE CROSS/BLUE SHIELD | Admitting: Physician Assistant

## 2018-12-23 ENCOUNTER — Encounter: Payer: Self-pay | Admitting: Physician Assistant

## 2018-12-23 ENCOUNTER — Other Ambulatory Visit: Payer: Self-pay | Admitting: Physician Assistant

## 2018-12-23 DIAGNOSIS — M5137 Other intervertebral disc degeneration, lumbosacral region: Secondary | ICD-10-CM

## 2018-12-23 MED ORDER — OXYCODONE-ACETAMINOPHEN 5-325 MG PO TABS: 1.0000 | ORAL_TABLET | Freq: Four times a day (QID) | ORAL | 0 refills | Status: DC | PRN

## 2019-01-21 ENCOUNTER — Other Ambulatory Visit: Payer: Self-pay | Admitting: Physician Assistant

## 2019-01-21 DIAGNOSIS — IMO0002 Reserved for concepts with insufficient information to code with codable children: Secondary | ICD-10-CM

## 2019-01-21 DIAGNOSIS — E1142 Type 2 diabetes mellitus with diabetic polyneuropathy: Secondary | ICD-10-CM

## 2019-01-21 DIAGNOSIS — G2581 Restless legs syndrome: Secondary | ICD-10-CM

## 2019-01-21 DIAGNOSIS — E1165 Type 2 diabetes mellitus with hyperglycemia: Secondary | ICD-10-CM

## 2019-01-23 ENCOUNTER — Ambulatory Visit (INDEPENDENT_AMBULATORY_CARE_PROVIDER_SITE_OTHER): Payer: BLUE CROSS/BLUE SHIELD | Admitting: Physician Assistant

## 2019-01-23 ENCOUNTER — Encounter: Payer: Self-pay | Admitting: Physician Assistant

## 2019-01-23 ENCOUNTER — Encounter: Payer: BLUE CROSS/BLUE SHIELD | Admitting: Physician Assistant

## 2019-01-23 VITALS — BP 134/85 | HR 105 | Temp 97.9°F | Ht 69.5 in | Wt 227.2 lb

## 2019-01-23 DIAGNOSIS — Z0000 Encounter for general adult medical examination without abnormal findings: Secondary | ICD-10-CM | POA: Diagnosis not present

## 2019-01-23 DIAGNOSIS — R7989 Other specified abnormal findings of blood chemistry: Secondary | ICD-10-CM

## 2019-01-23 DIAGNOSIS — E042 Nontoxic multinodular goiter: Secondary | ICD-10-CM

## 2019-01-23 DIAGNOSIS — E1165 Type 2 diabetes mellitus with hyperglycemia: Secondary | ICD-10-CM

## 2019-01-23 DIAGNOSIS — E7800 Pure hypercholesterolemia, unspecified: Secondary | ICD-10-CM | POA: Diagnosis not present

## 2019-01-23 DIAGNOSIS — Z1239 Encounter for other screening for malignant neoplasm of breast: Secondary | ICD-10-CM | POA: Diagnosis not present

## 2019-01-23 DIAGNOSIS — E1142 Type 2 diabetes mellitus with diabetic polyneuropathy: Secondary | ICD-10-CM

## 2019-01-23 DIAGNOSIS — E6609 Other obesity due to excess calories: Secondary | ICD-10-CM

## 2019-01-23 DIAGNOSIS — H8111 Benign paroxysmal vertigo, right ear: Secondary | ICD-10-CM

## 2019-01-23 DIAGNOSIS — I10 Essential (primary) hypertension: Secondary | ICD-10-CM

## 2019-01-23 DIAGNOSIS — IMO0002 Reserved for concepts with insufficient information to code with codable children: Secondary | ICD-10-CM

## 2019-01-23 DIAGNOSIS — Z6833 Body mass index (BMI) 33.0-33.9, adult: Secondary | ICD-10-CM

## 2019-01-23 DIAGNOSIS — R945 Abnormal results of liver function studies: Secondary | ICD-10-CM

## 2019-01-23 DIAGNOSIS — Z1211 Encounter for screening for malignant neoplasm of colon: Secondary | ICD-10-CM

## 2019-01-23 DIAGNOSIS — F321 Major depressive disorder, single episode, moderate: Secondary | ICD-10-CM | POA: Insufficient documentation

## 2019-01-23 NOTE — Progress Notes (Signed)
Patient: Gabriella Marsh, Female    DOB: [birth year], 53 y.o.   MRN: [id number] Visit Date: 01/23/2019  Today's Provider: Mar Daring, PA-C   Chief Complaint  Patient presents with  . Annual Exam   Subjective:     Annual physical exam Gabriella Marsh is a 53 y.o. female who presents today for health maintenance and complete physical. She feels fairly well. She reports exercising none. She reports she is sleeping poorly. -----------------------------------------------------------------   Review of Systems  Constitutional: Negative.   HENT: Negative.   Eyes: Negative.   Respiratory: Negative.   Cardiovascular: Negative.   Gastrointestinal: Negative.        Acid reflux   Endocrine: Negative.   Genitourinary: Negative.   Musculoskeletal: Negative.   Skin: Negative.   Allergic/Immunologic: Negative.   Neurological: Positive for dizziness and headaches.  Hematological: Negative.   Psychiatric/Behavioral: Negative.     Social History      She  reports that she quit smoking about 7 years ago. She has never used smokeless tobacco. She reports that she does not drink alcohol.       Social History   Socioeconomic History  . Marital status: Married    Spouse name: Not on file  . Number of children: 1  . Years of education: H/S  . Highest education level: Not on file  Occupational History  . Not on file  Social Needs  . Financial resource strain: Not on file  . Food insecurity:    Worry: Not on file    Inability: Not on file  . Transportation needs:    Medical: Not on file    Non-medical: Not on file  Tobacco Use  . Smoking status: Former Smoker    Last attempt to quit: 2013    Years since quitting: 7.0  . Smokeless tobacco: Never Used  Substance and Sexual Activity  . Alcohol use: No    Alcohol/week: 0.0 standard drinks    Frequency: Never  . Drug use: Not on file  . Sexual activity: Not on file  Lifestyle  . Physical activity:    Days per week:  Not on file    Minutes per session: Not on file  . Stress: Not on file  Relationships  . Social connections:    Talks on phone: Not on file    Gets together: Not on file    Attends religious service: Not on file    Active member of club or organization: Not on file    Attends meetings of clubs or organizations: Not on file    Relationship status: Not on file  Other Topics Concern  . Not on file  Social History Narrative  . Not on file    Past Medical History:  Diagnosis Date  . Arthritis    knees  . Degenerative disc disease, lumbar   . Diabetes mellitus without complication ([place])   . GERD (gastroesophageal reflux disease)   . Headache    daily - AM (has not been able to have SPG blocks lately)  . Hyperlipidemia   . Hypertension   . Neuropathy    feet  . Vertigo    3-4x/yr     Patient Active Problem List   Diagnosis Date Noted  . Benign positional vertigo 02/03/2016  . Uncontrolled type 2 diabetes mellitus with peripheral neuropathy ([place]) 07/22/2015  . Common migraine with intractable migraine 06/18/2015  . Anxiety 05/04/2015  . Adaptation reaction 05/03/2015  .  DDD (degenerative disc disease), lumbosacral 05/03/2015  . Hypercholesteremia 05/03/2015  . Insomnia due to medical condition 05/03/2015  . Adiposity 05/03/2015  . Disorder of peripheral nervous system 05/03/2015  . Snores 05/03/2015  . Diabetic neuropathy ([place]) 03/06/2015  . Essential hypertension 03/06/2015  . Elevated liver function tests 03/06/2015  . Obesity 03/06/2015  . GERD (gastroesophageal reflux disease) 03/06/2015  . Multinodular goiter 03/06/2015  . RLS (restless legs syndrome) 03/06/2015  . Leg cramps 03/06/2015    Past Surgical History:  Procedure Laterality Date  . ABDOMINAL HYSTERECTOMY     But still has cervix  . CESAREAN SECTION    . CHOLECYSTECTOMY    . OVARIAN CYST SURGERY    . SHOULDER SURGERY Left 12/26/014   Dr. Little Ishikawa, [facility]  . TONSILLECTOMY AND ADENOIDECTOMY      . UTERINE FIBROID SURGERY      Family History        Family Status  Relation Name Status  . Father  Deceased at age 34  . Brother  Alive  . Mother  (Not Specified)  . MGM  (Not Specified)        Her family history includes Congestive Heart Failure in her father; Diabetes in her father; Healthy in her brother; Heart disease in her father; Hyperlipidemia in her father; Hypertension in her father; Irritable bowel syndrome in her mother; Osteoporosis in her maternal grandmother and mother.      Allergies  Allergen Reactions  . Celecoxib Shortness Of Breath  . Pregabalin Shortness Of Breath     Current Outpatient Medications:  .  atorvastatin (LIPITOR) 20 MG tablet, TAKE 1 TABLET DAILY, Disp: 90 tablet, Rfl: 3 .  busPIRone (BUSPAR) 15 MG tablet, TAKE ONE-HALF (1/2) TABLET TWICE A DAY, Disp: 90 tablet, Rfl: 4 .  gabapentin (NEURONTIN) 100 MG capsule, TAKE 2 CAPSULES THREE TIMES A DAY, ALONG WITH 600 MG THREE TIMES A DAY FOR A TOTAL DAILY DOSE OF 2400 MG DAILY, Disp: 540 capsule, Rfl: 1 .  gabapentin (NEURONTIN) 600 MG tablet, TAKE 1 TABLET THREE TIMES A DAY, Disp: 270 tablet, Rfl: 4 .  glipiZIDE (GLUCOTROL) 10 MG tablet, TAKE ONE-HALF (1/2) TABLET TWICE A DAY BEFORE MEALS, Disp: 90 tablet, Rfl: 4 .  glucose blood test strip, Check blood sugars twice daily. OneTouch Verio strips., Disp: 200 each, Rfl: 5 .  Insulin Pen Needle (CAREFINE PEN NEEDLES) 32G X 4 MM MISC, Use 1x a day, Disp: 50 each, Rfl: 11 .  Melatonin 3 MG TABS, Take 1 tablet by mouth at bedtime., Disp: , Rfl:  .  metFORMIN (GLUCOPHAGE-XR) 500 MG 24 hr tablet, TAKE 2 TABLETS TWICE A DAY WITH MEALS, Disp: 360 tablet, Rfl: 4 .  Multiple Vitamins-Minerals (MULTIVITAMIN ADULT PO), Take by mouth., Disp: , Rfl:  .  nortriptyline (PAMELOR) 25 MG capsule, TAKE 2 CAPSULES AT BEDTIME, Disp: 180 capsule, Rfl: 4 .  omeprazole (PRILOSEC) 20 MG capsule, Take 1 capsule (20 mg total) by mouth daily., Disp: 30 capsule, Rfl: 3 .   oxyCODONE-acetaminophen (PERCOCET/ROXICET) 5-325 MG tablet, Take 1 tablet by mouth every 6 (six) hours as needed for severe pain., Disp: 120 tablet, Rfl: 0 .  triamterene-hydrochlorothiazide (MAXZIDE-25) 37.5-25 MG tablet, TAKE 1 TABLET BY MOUTH DAILY, Disp: 90 tablet, Rfl: 1   Patient Care Team: Mar Daring, PA-C as PCP - General (Family Medicine)    Objective:    Vitals: BP 134/85 (BP Location: Right Arm, Patient Position: Sitting, Cuff Size: Large)   Pulse (!) 105  Temp 97.9 F (36.6 C) (Oral)   Ht 5' 9.5" (1.765 m)   Wt 227 lb 3.2 oz (103.1 kg)   SpO2 96%   BMI 33.07 kg/m    Vitals:   01/23/19 1503  BP: 134/85  Pulse: (!) 105  Temp: 97.9 F (36.6 C)  TempSrc: Oral  SpO2: 96%  Weight: 227 lb 3.2 oz (103.1 kg)  Height: 5' 9.5" (1.765 m)     Physical Exam Vitals signs reviewed.  Constitutional:      General: She is not in acute distress.    Appearance: She is well-developed. She is obese. She is not diaphoretic.  HENT:     Head: Normocephalic and atraumatic.     Right Ear: External ear normal.     Left Ear: External ear normal.     Nose: Nose normal.     Mouth/Throat:     Pharynx: No oropharyngeal exudate.  Eyes:     General: No scleral icterus.       Right eye: No discharge.        Left eye: No discharge.     Conjunctiva/sclera: Conjunctivae normal.     Pupils: Pupils are equal, round, and reactive to light.  Neck:     Musculoskeletal: Normal range of motion and neck supple.     Thyroid: No thyromegaly.     Vascular: No carotid bruit or JVD.     Trachea: No tracheal deviation.  Cardiovascular:     Rate and Rhythm: Normal rate and regular rhythm.     Heart sounds: Normal heart sounds. No murmur. No friction rub. No gallop.   Pulmonary:     Effort: Pulmonary effort is normal. No respiratory distress.     Breath sounds: Normal breath sounds. No wheezing or rales.  Chest:     Chest wall: No tenderness.     Breasts:        Right: Normal.         Left: Normal.  Abdominal:     General: Bowel sounds are normal. There is no distension.     Palpations: Abdomen is soft. There is no mass.     Tenderness: There is no abdominal tenderness. There is no guarding or rebound.  Musculoskeletal: Normal range of motion.        General: No tenderness.  Lymphadenopathy:     Cervical: No cervical adenopathy.  Skin:    General: Skin is warm and dry.     Findings: No rash.  Neurological:     Mental Status: She is alert and oriented to person, place, and time.  Psychiatric:        Behavior: Behavior normal.        Thought Content: Thought content normal.        Judgment: Judgment normal.      Depression Screen PHQ 2/9 Scores 01/23/2019 01/21/2018 10/21/2017  PHQ - 2 Score 4 2 0  PHQ- 9 Score 13 11 7        Assessment & Plan:     Routine Health Maintenance and Physical Exam  Exercise Activities and Dietary recommendations Goals   None     Immunization History  Administered Date(s) Administered  . Influenza Split 11/04/2014  . Influenza,inj,Quad PF,6+ Mos 11/01/2015, 11/11/2016  . Influenza-Unspecified 10/05/2017, 09/20/2018  . Pneumococcal Polysaccharide-23 07/25/2012  . Tdap 07/25/2012    Health Maintenance  Topic Date Due  . MAMMOGRAM  04/03/2017  . COLONOSCOPY  04/03/2017  . OPHTHALMOLOGY EXAM  02/24/2018  . HEMOGLOBIN A1C  07/21/2018  . URINE MICROALBUMIN  10/21/2018  . FOOT EXAM  01/21/2019  . PAP SMEAR-Modifier  01/21/2021  . TETANUS/TDAP  07/25/2022  . INFLUENZA VACCINE  Completed  . PNEUMOCOCCAL POLYSACCHARIDE VACCINE AGE 58-64 HIGH RISK  Completed  . HIV Screening  Completed     Discussed health benefits of physical activity, and encouraged her to engage in regular exercise appropriate for her age and condition.    1. Annual physical exam Normal physical exam today. Will check labs as below and f/u pending lab results. If labs are stable and WNL she will not need to have these rechecked for one year at her  next annual physical exam. She is to call the office in the meantime if she has any acute issue, questions or concerns.  2. Breast cancer screening Breast exam today was normal. There is no family history of breast cancer. She does perform regular self breast exams. Mammogram was ordered as below. Information for [facility] was given to patient so she may schedule her mammogram at her convenience. - MM 3D SCREEN BREAST BILATERAL; Future  3. Benign paroxysmal positional vertigo of right ear Stable at this time. Uses meclizine prn.   4. Essential hypertension Stable today. Continue Maxzide 37.5-25mg . Will check labs as below and f/u pending results. - CBC with Differential/Platelet - Comprehensive metabolic panel - Hemoglobin A1c - Lipid panel  5. Uncontrolled type 2 diabetes mellitus with peripheral neuropathy (HCC) Continue glipizide 10mg  (1/2 tab BID), metformin xr 500mg  (2 tabs BID). Will check labs as below and f/u pending results. - CBC with Differential/Platelet - Comprehensive metabolic panel - Hemoglobin A1c - Lipid panel  6. Multinodular goiter Stable and unchanged. Will check labs as below and f/u pending results. - CBC with Differential/Platelet - TSH  7. Elevated liver function tests H/O this. Working on cutting fatty foods from diet. Will check labs as below and f/u pending results. - CBC with Differential/Platelet - Comprehensive metabolic panel - Hemoglobin A1c - Lipid panel  8. Hypercholesteremia Stable on atorvastatin 20mg . Will check labs as below and f/u pending results. - CBC with Differential/Platelet - Comprehensive metabolic panel - Hemoglobin A1c - Lipid panel  9. Class 1 obesity due to excess calories with serious comorbidity and body mass index (BMI) of 33.0 to 33.9 in adult Counseled patient on healthy lifestyle modifications including dieting and exercise.  - CBC with Differential/Platelet - Comprehensive metabolic panel -  Hemoglobin A1c - Lipid panel  10. Depression, major, single episode, moderate (HCC) Controlled on Buspar 15mg  (1/2 tab) BID and nortriptyline 25mg  daily.  11. Colon cancer screening Cologuard ordered.  - Cologuard  --------------------------------------------------------------------    Mar Daring, PA-C  [facility]

## 2019-01-23 NOTE — Patient Instructions (Signed)
Health Maintenance for Postmenopausal Women Menopause is a normal process in which your reproductive ability comes to an end. This process happens gradually over a span of months to years, usually between the ages of 62 and 89. Menopause is complete when you have missed 12 consecutive menstrual periods. It is important to talk with your health care provider about some of the most common conditions that affect postmenopausal women, such as heart disease, cancer, and bone loss (osteoporosis). Adopting a healthy lifestyle and getting preventive care can help to promote your health and wellness. Those actions can also lower your chances of developing some of these common conditions. What should I know about menopause? During menopause, you may experience a number of symptoms, such as:  Moderate-to-severe hot flashes.  Night sweats.  Decrease in sex drive.  Mood swings.  Headaches.  Tiredness.  Irritability.  Memory problems.  Insomnia. Choosing to treat or not to treat menopausal changes is an individual decision that you make with your health care provider. What should I know about hormone replacement therapy and supplements? Hormone therapy products are effective for treating symptoms that are associated with menopause, such as hot flashes and night sweats. Hormone replacement carries certain risks, especially as you become older. If you are thinking about using estrogen or estrogen with progestin treatments, discuss the benefits and risks with your health care provider. What should I know about heart disease and stroke? Heart disease, heart attack, and stroke become more likely as you age. This may be due, in part, to the hormonal changes that your body experiences during menopause. These can affect how your body processes dietary fats, triglycerides, and cholesterol. Heart attack and stroke are both medical emergencies. There are many things that you can do to help prevent heart disease  and stroke:  Have your blood pressure checked at least every 1-2 years. High blood pressure causes heart disease and increases the risk of stroke.  If you are 79-72 years old, ask your health care provider if you should take aspirin to prevent a heart attack or a stroke.  Do not use any tobacco products, including cigarettes, chewing tobacco, or electronic cigarettes. If you need help quitting, ask your health care provider.  It is important to eat a healthy diet and maintain a healthy weight. ? Be sure to include plenty of vegetables, fruits, low-fat dairy products, and lean protein. ? Avoid eating foods that are high in solid fats, added sugars, or salt (sodium).  Get regular exercise. This is one of the most important things that you can do for your health. ? Try to exercise for at least 150 minutes each week. The type of exercise that you do should increase your heart rate and make you sweat. This is known as moderate-intensity exercise. ? Try to do strengthening exercises at least twice each week. Do these in addition to the moderate-intensity exercise.  Know your numbers.Ask your health care provider to check your cholesterol and your blood glucose. Continue to have your blood tested as directed by your health care provider.  What should I know about cancer screening? There are several types of cancer. Take the following steps to reduce your risk and to catch any cancer development as early as possible. Breast Cancer  Practice breast self-awareness. ? This means understanding how your breasts normally appear and feel. ? It also means doing regular breast self-exams. Let your health care provider know about any changes, no matter how small.  If you are 40 or  older, have a clinician do a breast exam (clinical breast exam or CBE) every year. Depending on your age, family history, and medical history, it may be recommended that you also have a yearly breast X-ray (mammogram).  If you  have a family history of breast cancer, talk with your health care provider about genetic screening.  If you are at high risk for breast cancer, talk with your health care provider about having an MRI and a mammogram every year.  Breast cancer (BRCA) gene test is recommended for women who have family members with BRCA-related cancers. Results of the assessment will determine the need for genetic counseling and BRCA1 and for BRCA2 testing. BRCA-related cancers include these types: ? Breast. This occurs in males or females. ? Ovarian. ? Tubal. This may also be called fallopian tube cancer. ? Cancer of the abdominal or pelvic lining (peritoneal cancer). ? Prostate. ? Pancreatic. Cervical, Uterine, and Ovarian Cancer Your health care provider may recommend that you be screened regularly for cancer of the pelvic organs. These include your ovaries, uterus, and vagina. This screening involves a pelvic exam, which includes checking for microscopic changes to the surface of your cervix (Pap test).  For women ages 21-65, health care providers may recommend a pelvic exam and a Pap test every three years. For women ages 39-65, they may recommend the Pap test and pelvic exam, combined with testing for human papilloma virus (HPV), every five years. Some types of HPV increase your risk of cervical cancer. Testing for HPV may also be done on women of any age who have unclear Pap test results.  Other health care providers may not recommend any screening for nonpregnant women who are considered low risk for pelvic cancer and have no symptoms. Ask your health care provider if a screening pelvic exam is right for you.  If you have had past treatment for cervical cancer or a condition that could lead to cancer, you need Pap tests and screening for cancer for at least 20 years after your treatment. If Pap tests have been discontinued for you, your risk factors (such as having a new sexual partner) need to be reassessed  to determine if you should start having screenings again. Some women have medical problems that increase the chance of getting cervical cancer. In these cases, your health care provider may recommend that you have screening and Pap tests more often.  If you have a family history of uterine cancer or ovarian cancer, talk with your health care provider about genetic screening.  If you have vaginal bleeding after reaching menopause, tell your health care provider.  There are currently no reliable tests available to screen for ovarian cancer. Lung Cancer Lung cancer screening is recommended for adults 57-50 years old who are at high risk for lung cancer because of a history of smoking. A yearly low-dose CT scan of the lungs is recommended if you:  Currently smoke.  Have a history of at least 30 pack-years of smoking and you currently smoke or have quit within the past 15 years. A pack-year is smoking an average of one pack of cigarettes per day for one year. Yearly screening should:  Continue until it has been 15 years since you quit.  Stop if you develop a health problem that would prevent you from having lung cancer treatment. Colorectal Cancer  This type of cancer can be detected and can often be prevented.  Routine colorectal cancer screening usually begins at age 12 and continues through  age 75.  If you have risk factors for colon cancer, your health care provider may recommend that you be screened at an earlier age.  If you have a family history of colorectal cancer, talk with your health care provider about genetic screening.  Your health care provider may also recommend using home test kits to check for hidden blood in your stool.  A small camera at the end of a tube can be used to examine your colon directly (sigmoidoscopy or colonoscopy). This is done to check for the earliest forms of colorectal cancer.  Direct examination of the colon should be repeated every 5-10 years until  age 75. However, if early forms of precancerous polyps or small growths are found or if you have a family history or genetic risk for colorectal cancer, you may need to be screened more often. Skin Cancer  Check your skin from head to toe regularly.  Monitor any moles. Be sure to tell your health care provider: ? About any new moles or changes in moles, especially if there is a change in a mole's shape or color. ? If you have a mole that is larger than the size of a pencil eraser.  If any of your family members has a history of skin cancer, especially at a young age, talk with your health care provider about genetic screening.  Always use sunscreen. Apply sunscreen liberally and repeatedly throughout the day.  Whenever you are outside, protect yourself by wearing long sleeves, pants, a wide-brimmed hat, and sunglasses. What should I know about osteoporosis? Osteoporosis is a condition in which bone destruction happens more quickly than new bone creation. After menopause, you may be at an increased risk for osteoporosis. To help prevent osteoporosis or the bone fractures that can happen because of osteoporosis, the following is recommended:  If you are 19-50 years old, get at least 1,000 mg of calcium and at least 600 mg of vitamin D per day.  If you are older than age 50 but younger than age 70, get at least 1,200 mg of calcium and at least 600 mg of vitamin D per day.  If you are older than age 70, get at least 1,200 mg of calcium and at least 800 mg of vitamin D per day. Smoking and excessive alcohol intake increase the risk of osteoporosis. Eat foods that are rich in calcium and vitamin D, and do weight-bearing exercises several times each week as directed by your health care provider. What should I know about how menopause affects my mental health? Depression may occur at any age, but it is more common as you become older. Common symptoms of depression include:  Low or sad  mood.  Changes in sleep patterns.  Changes in appetite or eating patterns.  Feeling an overall lack of motivation or enjoyment of activities that you previously enjoyed.  Frequent crying spells. Talk with your health care provider if you think that you are experiencing depression. What should I know about immunizations? It is important that you get and maintain your immunizations. These include:  Tetanus, diphtheria, and pertussis (Tdap) booster vaccine.  Influenza every year before the flu season begins.  Pneumonia vaccine.  Shingles vaccine. Your health care provider may also recommend other immunizations. This information is not intended to replace advice given to you by your health care provider. Make sure you discuss any questions you have with your health care provider. Document Released: 01/29/2006 Document Revised: 06/26/2016 Document Reviewed: 09/10/2015 Elsevier Interactive Patient Education    2019 [place].

## 2019-01-24 LAB — CBC WITH DIFFERENTIAL/PLATELET
Basophils Absolute: 0.1 10*3/uL (ref 0.0–0.2)
Basos: 1 %
EOS (ABSOLUTE): 0.2 10*3/uL (ref 0.0–0.4)
Eos: 2 %
Hematocrit: 38.2 % (ref 34.0–46.6)
Hemoglobin: 13.5 g/dL (ref 11.1–15.9)
IMMATURE GRANS (ABS): 0.1 10*3/uL (ref 0.0–0.1)
IMMATURE GRANULOCYTES: 1 %
LYMPHS: 40 %
Lymphocytes Absolute: 3.9 10*3/uL — ABNORMAL HIGH (ref 0.7–3.1)
MCH: 28.8 pg (ref 26.6–33.0)
MCHC: 35.3 g/dL (ref 31.5–35.7)
MCV: 81 fL (ref 79–97)
MONOCYTES: 7 %
Monocytes Absolute: 0.7 10*3/uL (ref 0.1–0.9)
Neutrophils Absolute: 5 10*3/uL (ref 1.4–7.0)
Neutrophils: 49 %
Platelets: 348 10*3/uL (ref 150–450)
RBC: 4.69 x10E6/uL (ref 3.77–5.28)
RDW: 12.9 % (ref 11.7–15.4)
WBC: 9.9 10*3/uL (ref 3.4–10.8)

## 2019-01-24 LAB — COMPREHENSIVE METABOLIC PANEL
ALT: 38 IU/L — ABNORMAL HIGH (ref 0–32)
AST: 28 IU/L (ref 0–40)
Albumin/Globulin Ratio: 1.5 (ref 1.2–2.2)
Albumin: 4.3 g/dL (ref 3.8–4.9)
Alkaline Phosphatase: 91 IU/L (ref 39–117)
BUN/Creatinine Ratio: 19 (ref 9–23)
BUN: 12 mg/dL (ref 6–24)
Bilirubin Total: 0.7 mg/dL (ref 0.0–1.2)
CO2: 23 mmol/L (ref 20–29)
Calcium: 9.8 mg/dL (ref 8.7–10.2)
Chloride: 93 mmol/L — ABNORMAL LOW (ref 96–106)
Creatinine, Ser: 0.63 mg/dL (ref 0.57–1.00)
GFR calc Af Amer: 120 mL/min/{1.73_m2} (ref >59)
GFR calc non Af Amer: 104 mL/min/{1.73_m2} (ref >59)
GLUCOSE: 255 mg/dL — AB (ref 65–99)
Globulin, Total: 2.8 g/dL (ref 1.5–4.5)
Potassium: 4.4 mmol/L (ref 3.5–5.2)
Sodium: 138 mmol/L (ref 134–144)
TOTAL PROTEIN: 7.1 g/dL (ref 6.0–8.5)

## 2019-01-24 LAB — LIPID PANEL
Chol/HDL Ratio: 5.9 ratio — ABNORMAL HIGH (ref 0.0–4.4)
Cholesterol, Total: 217 mg/dL — ABNORMAL HIGH (ref 100–199)
HDL: 37 mg/dL — ABNORMAL LOW (ref >39)
Triglycerides: 614 mg/dL (ref 0–149)

## 2019-01-24 LAB — HEMOGLOBIN A1C
Est. average glucose Bld gHb Est-mCnc: 223 mg/dL
Hgb A1c MFr Bld: 9.4 % — ABNORMAL HIGH (ref 4.8–5.6)

## 2019-01-24 LAB — TSH: TSH: 1.53 u[IU]/mL (ref 0.450–4.500)

## 2019-01-25 ENCOUNTER — Telehealth: Payer: Self-pay

## 2019-01-25 NOTE — Telephone Encounter (Signed)
-----   Message from Mar Daring, [place] sent at 01/25/2019  6:28 PM EST ----- Blood count is normal. Kidney function normal. Liver enzymes borderline, but improved from last check. A1c is down to 9.4 from 10.6. Continue medications and work on lifestyle modifications. Cholesterol also fairly inaccurate due to elevated triglycerides from it not being a fasting lab. Would recommend for [place] to recheck in the near future with you fasting that morning for more accurate results. Also still need to continue to work on better dietary habits. Thyroid is normal.

## 2019-01-25 NOTE — Telephone Encounter (Signed)
Patient advised as directed below.  Thanks,  -Joseline 

## 2019-01-26 ENCOUNTER — Encounter: Payer: Self-pay | Admitting: Physician Assistant

## 2019-01-26 ENCOUNTER — Other Ambulatory Visit: Payer: Self-pay | Admitting: *Deleted

## 2019-01-26 DIAGNOSIS — M5137 Other intervertebral disc degeneration, lumbosacral region: Secondary | ICD-10-CM

## 2019-01-27 MED ORDER — OXYCODONE-ACETAMINOPHEN 5-325 MG PO TABS: 1.0000 | ORAL_TABLET | Freq: Four times a day (QID) | ORAL | 0 refills | Status: DC | PRN

## 2019-01-27 NOTE — Telephone Encounter (Signed)
NCCSR reviewed. Rx sent in. 

## 2019-02-09 DIAGNOSIS — E113293 Type 2 diabetes mellitus with mild nonproliferative diabetic retinopathy without macular edema, bilateral: Secondary | ICD-10-CM | POA: Diagnosis not present

## 2019-02-09 LAB — HM DIABETES EYE EXAM

## 2019-02-10 ENCOUNTER — Encounter: Payer: Self-pay | Admitting: Physician Assistant

## 2019-02-23 ENCOUNTER — Encounter: Payer: Self-pay | Admitting: Physician Assistant

## 2019-02-23 DIAGNOSIS — M5137 Other intervertebral disc degeneration, lumbosacral region: Secondary | ICD-10-CM

## 2019-02-23 MED ORDER — OXYCODONE-ACETAMINOPHEN 5-325 MG PO TABS: 1.0000 | ORAL_TABLET | Freq: Four times a day (QID) | ORAL | 0 refills | Status: DC | PRN

## 2019-02-24 ENCOUNTER — Encounter: Payer: BLUE CROSS/BLUE SHIELD | Admitting: Physician Assistant

## 2019-02-27 ENCOUNTER — Other Ambulatory Visit: Payer: Self-pay | Admitting: Physician Assistant

## 2019-02-28 ENCOUNTER — Encounter: Payer: Self-pay | Admitting: Physician Assistant

## 2019-02-28 DIAGNOSIS — E1140 Type 2 diabetes mellitus with diabetic neuropathy, unspecified: Secondary | ICD-10-CM

## 2019-03-01 MED ORDER — GABAPENTIN 600 MG PO TABS: 600.0000 mg | ORAL_TABLET | Freq: Three times a day (TID) | ORAL | 4 refills | Status: DC

## 2019-03-18 ENCOUNTER — Other Ambulatory Visit: Payer: Self-pay | Admitting: Physician Assistant

## 2019-03-18 DIAGNOSIS — M5137 Other intervertebral disc degeneration, lumbosacral region: Secondary | ICD-10-CM

## 2019-03-20 MED ORDER — OXYCODONE-ACETAMINOPHEN 5-325 MG PO TABS: 1.0000 | ORAL_TABLET | Freq: Four times a day (QID) | ORAL | 0 refills | Status: DC | PRN

## 2019-03-29 ENCOUNTER — Encounter: Payer: Self-pay | Admitting: Physician Assistant

## 2019-04-20 ENCOUNTER — Encounter: Payer: Self-pay | Admitting: Physician Assistant

## 2019-04-20 DIAGNOSIS — M51379 Other intervertebral disc degeneration, lumbosacral region without mention of lumbar back pain or lower extremity pain: Secondary | ICD-10-CM

## 2019-04-20 DIAGNOSIS — M5137 Other intervertebral disc degeneration, lumbosacral region: Secondary | ICD-10-CM

## 2019-04-20 MED ORDER — OXYCODONE-ACETAMINOPHEN 5-325 MG PO TABS: 1.0000 | ORAL_TABLET | Freq: Four times a day (QID) | ORAL | 0 refills | Status: DC | PRN

## 2019-04-21 ENCOUNTER — Other Ambulatory Visit: Payer: Self-pay | Admitting: Physician Assistant

## 2019-04-21 ENCOUNTER — Encounter: Payer: Self-pay | Admitting: Physician Assistant

## 2019-04-21 DIAGNOSIS — I10 Essential (primary) hypertension: Secondary | ICD-10-CM

## 2019-05-03 ENCOUNTER — Ambulatory Visit: Payer: Self-pay | Admitting: Physician Assistant

## 2019-05-05 ENCOUNTER — Encounter: Payer: Self-pay | Admitting: Physician Assistant

## 2019-05-17 NOTE — Progress Notes (Signed)
Patient: Gabriella Marsh Female    DOB: 1966-04-11   53 y.o.   MRN: [id number] Visit Date: 05/18/2019  Today's Provider: Mar Daring, PA-C   Chief Complaint  Patient presents with  . Follow-up  . Diabetes  . Hypertension  . Hyperlipidemia   Subjective:     HPI   Essential hypertension From 01/23/2019-Stable today. Continue Maxzide 37.5-25mg . Labs checked, no changes.  Uncontrolled type 2 diabetes mellitus with peripheral neuropathy ([place]) From 01/23/2019-Continue glipizide 10mg  (1/2 tab BID), metformin xr 500mg  (2 tabs BID). Labs checked, no changes.  Multinodular goiter From 01/23/2019-Stable and unchanged. Labs checked, no cahnges.  Elevated liver function tests From 01/23/2019-labs checked, no changes.  Hypercholesteremia From 01/23/2019- labs checked, work on diet and exercise.  Class 1 obesity due to excess calories with serious comorbidity and body mass index (BMI) of 33.0 to 33.9 in adult From 01/23/2019-Counseled patient on healthy lifestyle modifications including dieting and exercise.   Depression, major, single episode, moderate ([place]) From 01/23/2019-Controlled on Buspar 15mg  (1/2 tab) BID and nortriptyline 25mg  daily.   Allergies  Allergen Reactions  . Celecoxib Shortness Of Breath  . Pregabalin Shortness Of Breath     Current Outpatient Medications:  .  atorvastatin (LIPITOR) 20 MG tablet, TAKE 1 TABLET DAILY, Disp: 90 tablet, Rfl: 3 .  busPIRone (BUSPAR) 15 MG tablet, TAKE ONE-HALF (1/2) TABLET TWICE A DAY, Disp: 90 tablet, Rfl: 4 .  gabapentin (NEURONTIN) 100 MG capsule, TAKE 2 CAPSULES THREE TIMES A DAY ALONG WITH 600 MG THREE TIMES A DAY FOR A TOTAL DAILY DOSE OF 2400 MG DAILY, Disp: 540 capsule, Rfl: 3 .  gabapentin (NEURONTIN) 600 MG tablet, Take 1 tablet (600 mg total) by mouth 3 (three) times daily., Disp: 270 tablet, Rfl: 4 .  glipiZIDE (GLUCOTROL) 10 MG tablet, TAKE ONE-HALF (1/2) TABLET TWICE A DAY BEFORE MEALS, Disp: 90 tablet, Rfl: 4 .   glucose blood test strip, Check blood sugars twice daily. OneTouch Verio strips., Disp: 200 each, Rfl: 5 .  Insulin Pen Needle (CAREFINE PEN NEEDLES) 32G X 4 MM MISC, Use 1x a day, Disp: 50 each, Rfl: 11 .  Melatonin 3 MG TABS, Take 1 tablet by mouth at bedtime., Disp: , Rfl:  .  metFORMIN (GLUCOPHAGE-XR) 500 MG 24 hr tablet, TAKE 2 TABLETS TWICE A DAY WITH MEALS, Disp: 360 tablet, Rfl: 4 .  Multiple Vitamins-Minerals (MULTIVITAMIN ADULT PO), Take by mouth., Disp: , Rfl:  .  nortriptyline (PAMELOR) 25 MG capsule, TAKE 2 CAPSULES AT BEDTIME, Disp: 180 capsule, Rfl: 4 .  omeprazole (PRILOSEC) 20 MG capsule, Take 1 capsule (20 mg total) by mouth daily., Disp: 30 capsule, Rfl: 3 .  oxyCODONE-acetaminophen (PERCOCET/ROXICET) 5-325 MG tablet, Take 1 tablet by mouth every 6 (six) hours as needed for severe pain., Disp: 120 tablet, Rfl: 0 .  triamterene-hydrochlorothiazide (MAXZIDE-25) 37.5-25 MG tablet, TAKE 1 TABLET DAILY, Disp: 90 tablet, Rfl: 3  Review of Systems  Constitutional: Negative for appetite change, chills, fatigue and fever.  Respiratory: Negative for chest tightness and shortness of breath.   Cardiovascular: Negative for chest pain and palpitations.  Gastrointestinal: Negative for abdominal pain, nausea and vomiting.  Neurological: Negative for dizziness and weakness.    Social History   Tobacco Use  . Smoking status: Former Smoker    Last attempt to quit: 2013    Years since quitting: 7.4  . Smokeless tobacco: Never Used  Substance Use Topics  . Alcohol use: No  Alcohol/week: 0.0 standard drinks    Frequency: Never      Objective:   BP 122/81 (BP Location: Left Arm, Patient Position: Sitting, Cuff Size: Large)   Pulse 97   Temp 97.9 F (36.6 C) (Oral)   Resp 16   Ht 5\' 10"  (1.778 m)   Wt 225 lb (102.1 kg)   SpO2 95%   BMI 32.28 kg/m  Vitals:   05/18/19 0821  BP: 122/81  Pulse: 97  Resp: 16  Temp: 97.9 F (36.6 C)  TempSrc: Oral  SpO2: 95%  Weight: 225 lb  (102.1 kg)  Height: 5\' 10"  (1.778 m)     Physical Exam Vitals signs reviewed.  Constitutional:      General: She is not in acute distress.    Appearance: Normal appearance. She is well-developed. She is obese. She is not ill-appearing or diaphoretic.  Neck:     Musculoskeletal: Normal range of motion and neck supple.     Thyroid: No thyromegaly.     Vascular: No JVD.     Trachea: No tracheal deviation.  Cardiovascular:     Rate and Rhythm: Normal rate and regular rhythm.     Heart sounds: Normal heart sounds. No murmur. No friction rub. No gallop.   Pulmonary:     Effort: Pulmonary effort is normal. No respiratory distress.     Breath sounds: Normal breath sounds. No wheezing or rales.  Lymphadenopathy:     Cervical: No cervical adenopathy.  Neurological:     Mental Status: She is alert.         Assessment & Plan    1. Type 2 diabetes mellitus with diabetic neuropathy, without long-term current use of insulin (HCC) A1c up to 10.0 from 9.4. Continue current medical treatment and will f/u in 3 months. If continuing to increase will add GLP-1 if covered. - POCT glycosylated hemoglobin (Hb A1C)     Mar Daring, PA-C  [facility]

## 2019-05-18 ENCOUNTER — Ambulatory Visit (INDEPENDENT_AMBULATORY_CARE_PROVIDER_SITE_OTHER): Payer: BLUE CROSS/BLUE SHIELD | Admitting: Physician Assistant

## 2019-05-18 ENCOUNTER — Encounter: Payer: Self-pay | Admitting: Physician Assistant

## 2019-05-18 ENCOUNTER — Other Ambulatory Visit: Payer: Self-pay

## 2019-05-18 VITALS — BP 122/81 | HR 97 | Temp 97.9°F | Resp 16 | Ht 70.0 in | Wt 225.0 lb

## 2019-05-18 DIAGNOSIS — E1140 Type 2 diabetes mellitus with diabetic neuropathy, unspecified: Secondary | ICD-10-CM | POA: Diagnosis not present

## 2019-05-18 LAB — POCT GLYCOSYLATED HEMOGLOBIN (HGB A1C)
Est. average glucose Bld gHb Est-mCnc: 240
Hemoglobin A1C: 10 % — AB (ref 4.0–5.6)

## 2019-05-22 ENCOUNTER — Encounter: Payer: Self-pay | Admitting: Physician Assistant

## 2019-05-22 DIAGNOSIS — M5137 Other intervertebral disc degeneration, lumbosacral region: Secondary | ICD-10-CM

## 2019-05-22 MED ORDER — OXYCODONE-ACETAMINOPHEN 5-325 MG PO TABS: 1.0000 | ORAL_TABLET | Freq: Four times a day (QID) | ORAL | 0 refills | Status: DC | PRN

## 2019-05-25 ENCOUNTER — Encounter: Payer: Self-pay | Admitting: Physician Assistant

## 2019-06-15 ENCOUNTER — Encounter: Payer: Self-pay | Admitting: Physician Assistant

## 2019-06-15 DIAGNOSIS — E1142 Type 2 diabetes mellitus with diabetic polyneuropathy: Secondary | ICD-10-CM

## 2019-06-15 NOTE — Addendum Note (Signed)
Addended by: Mar Daring on: 06/15/2019 01:42 PM   Modules accepted: Orders

## 2019-06-21 ENCOUNTER — Encounter: Payer: Self-pay | Admitting: Physician Assistant

## 2019-06-22 ENCOUNTER — Encounter: Payer: Self-pay | Admitting: Physician Assistant

## 2019-06-22 DIAGNOSIS — M5137 Other intervertebral disc degeneration, lumbosacral region: Secondary | ICD-10-CM

## 2019-06-22 MED ORDER — OXYCODONE-ACETAMINOPHEN 5-325 MG PO TABS: 1.0000 | ORAL_TABLET | Freq: Four times a day (QID) | ORAL | 0 refills | Status: DC | PRN

## 2019-06-27 ENCOUNTER — Encounter: Payer: Self-pay | Admitting: Physician Assistant

## 2019-06-27 DIAGNOSIS — W57XXXA Bitten or stung by nonvenomous insect and other nonvenomous arthropods, initial encounter: Secondary | ICD-10-CM

## 2019-06-27 MED ORDER — TRIAMCINOLONE ACETONIDE 0.1 % EX CREA: 1.0000 "application " | TOPICAL_CREAM | Freq: Two times a day (BID) | CUTANEOUS | 0 refills | Status: DC

## 2019-06-29 ENCOUNTER — Encounter: Payer: Self-pay | Admitting: Neurology

## 2019-06-30 ENCOUNTER — Telehealth (INDEPENDENT_AMBULATORY_CARE_PROVIDER_SITE_OTHER): Payer: BC Managed Care – PPO | Admitting: Neurology

## 2019-06-30 VITALS — Ht 70.0 in | Wt 225.0 lb

## 2019-06-30 DIAGNOSIS — E1142 Type 2 diabetes mellitus with diabetic polyneuropathy: Secondary | ICD-10-CM | POA: Diagnosis not present

## 2019-06-30 DIAGNOSIS — G5622 Lesion of ulnar nerve, left upper limb: Secondary | ICD-10-CM

## 2019-06-30 NOTE — Progress Notes (Signed)
New Patient Virtual Visit via Video Note The purpose of this virtual visit is to provide medical care while limiting exposure to the novel coronavirus.    Consent was obtained for video visit:  Yes.   Answered questions that patient had about telehealth interaction:  Yes.   I discussed the limitations, risks, security and privacy concerns of performing an evaluation and management service by telemedicine. I also discussed with the patient that there may be a patient responsible charge related to this service. The patient expressed understanding and agreed to proceed.  Pt location: Home Physician Location: office Name of referring provider:  Mar Daring, P* I connected with Gabriella Marsh at patients initiation/request on 06/30/2019 at  8:50 AM EDT by video enabled telemedicine application and verified that I am speaking with the correct person using two identifiers. Pt MRN:  [id number] Pt DOB:  December 05, 1966 Video Participants:  Gabriella Marsh    History of Present Illness: Gabriella Marsh is a 53 y.o. right-handed female with poorly-controlled diabetes mellitus (HbA1c 10.0), GERD, hypertension, hyperlipidemia, presenting for evaluation of peripheral neuropathy.  She was diagnosed with neuropathy around 2011 which manifested with stabbing pain in the feet.  Over the years, burning pain/numbness has involved the feet and into the lower legs.  She also has some tingling in the left 4th and 5th digit.  She denies hand weakness.  She has some imbalance and walks with a cane, as needed.    She takes gabapentin 800mg  TID, nortriptyline 25mg , and percocet 2-3 times as needed.  She was previously on nortriptyline 50mg  at bedtime but self tapered the medication.    She has morning stiffness of the ankles, feet, and knees.   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   Out-side paper records, electronic medical record, and images have been reviewed where available and summarized as:  Lab Results   Component Value Date   HGBA1C 10.0 (A) 05/18/2019   No results found for: [id number] Lab Results  Component Value Date   TSH 1.530 01/23/2019   No results found for: ESRSEDRATE, POCTSEDRATE  Past Medical History:  Diagnosis Date  . Arthritis    knees  . Degenerative disc disease, lumbar   . Diabetes mellitus without complication ([place])   . GERD (gastroesophageal reflux disease)   . Headache    daily - AM (has not been able to have SPG blocks lately)  . Hyperlipidemia   . Hypertension   . Neuropathy    feet  . Vertigo    3-4x/yr    Past Surgical History:  Procedure Laterality Date  . ABDOMINAL HYSTERECTOMY     But still has cervix  . CESAREAN SECTION    . CHOLECYSTECTOMY    . OVARIAN CYST SURGERY    . SHOULDER SURGERY Left 12/26/014   Dr. Little Ishikawa, [facility]  . TONSILLECTOMY AND ADENOIDECTOMY    . UTERINE FIBROID SURGERY       Medications:  Outpatient Encounter Medications as of 06/30/2019  Medication Sig  . atorvastatin (LIPITOR) 20 MG tablet TAKE 1 TABLET DAILY  . gabapentin (NEURONTIN) 100 MG capsule TAKE 2 CAPSULES THREE TIMES A DAY ALONG WITH 600 MG THREE TIMES A DAY FOR A TOTAL DAILY DOSE OF 2400 MG DAILY  . gabapentin (NEURONTIN) 600 MG tablet Take 1 tablet (600 mg total) by mouth 3 (three) times daily.  Marland Kitchen glipiZIDE (GLUCOTROL) 10 MG tablet TAKE ONE-HALF (1/2) TABLET TWICE A DAY BEFORE MEALS  . glucose blood test  strip Check blood sugars twice daily. OneTouch Verio strips.  . Insulin Pen Needle (CAREFINE PEN NEEDLES) 32G X 4 MM MISC Use 1x a day  . Melatonin 3 MG TABS Take 1 tablet by mouth at bedtime.  . metFORMIN (GLUCOPHAGE-XR) 500 MG 24 hr tablet TAKE 2 TABLETS TWICE A DAY WITH MEALS  . Multiple Vitamins-Minerals (MULTIVITAMIN ADULT PO) Take by mouth.  . nortriptyline (PAMELOR) 25 MG capsule TAKE 2 CAPSULES AT BEDTIME  . omeprazole (PRILOSEC) 20 MG capsule Take 1 capsule (20 mg total) by mouth daily.  Marland Kitchen oxyCODONE-acetaminophen (PERCOCET/ROXICET) 5-325 MG  tablet Take 1 tablet by mouth every 6 (six) hours as needed for severe pain.  Marland Kitchen triamcinolone cream (KENALOG) 0.1 % Apply 1 application topically 2 (two) times daily.  Marland Kitchen triamterene-hydrochlorothiazide (MAXZIDE-25) 37.5-25 MG tablet TAKE 1 TABLET DAILY   No facility-administered encounter medications on file as of 06/30/2019.     Allergies:  Allergies  Allergen Reactions  . Celecoxib Shortness Of Breath  . Pregabalin Shortness Of Breath  . Buspar [Buspirone] Other (See Comments)    Tachycardia    Family History: Family History  Problem Relation Age of Onset  . Diabetes Father   . Heart disease Father   . Hypertension Father   . Hyperlipidemia Father   . Congestive Heart Failure Father   . Healthy Brother   . Osteoporosis Mother   . Irritable bowel syndrome Mother   . Osteoporosis Maternal Grandmother     Social History: Social History   Tobacco Use  . Smoking status: Former Smoker    Quit date: 2013    Years since quitting: 7.5  . Smokeless tobacco: Never Used  Substance Use Topics  . Alcohol use: No    Alcohol/week: 0.0 standard drinks    Frequency: Never  . Drug use: Not on file   Social History   Social History Narrative   Right handed   Lives in a single story home with husband and son.    Review of Systems:  CONSTITUTIONAL: No fevers, chills, night sweats, or weight loss.   EYES: No visual changes or eye pain ENT: No hearing changes.  No history of nose bleeds.   RESPIRATORY: No cough, wheezing and shortness of breath.   CARDIOVASCULAR: Negative for chest pain, and palpitations.   GI: Negative for abdominal discomfort, blood in stools or black stools.  No recent change in bowel habits.   GU:  No history of incontinence.   MUSCLOSKELETAL: +history of joint pain or swelling.  No myalgias.   SKIN: Negative for lesions, rash, and itching.   HEMATOLOGY/ONCOLOGY: Negative for prolonged bleeding, bruising easily, and swollen nodes.  No history of cancer.    ENDOCRINE: Negative for cold or heat intolerance, polydipsia or goiter.   PSYCH:  No depression or anxiety symptoms.   NEURO: As Above.   Vital Signs:  Ht 5\' 10"  (1.778 m)   Wt 225 lb (102.1 kg)   BMI 32.28 kg/m    General Medical Exam:  Well appearing, comfortable.  Nonlabored breathing.  No deformity or edema.  No rash.  Neurological Exam: MENTAL STATUS including orientation to time, place, person, recent and remote memory, attention span and concentration, language, and fund of knowledge is normal.  Speech is not dysarthric.  CRANIAL NERVES:  Normal conjugate, extra-ocular eye movements in all directions of gaze.  No ptosis.  Normal facial symmetry and movements.  Normal shoulder shrug and head rotation.  Tongue is midline.  MOTOR:  Antigravity in all  extremities.  No abnormal movements.  No pronator drift.   SENSORY & REFLEXES:  Unable to assess   COORDINATION/GAIT: Normal finger to nose bilaterally.  Intact rapid alternating movements bilaterally.  Able to rise from a chair without using arms.  Gait mildly wide-based based and stable.  She has difficulty standing on heels and toes unassisted.   IMPRESSION/PLAN: 1.  Painful diabetic neuropathy and sensory ataxia.  Discussed the pathogenesis, etiology, management, and natural course of neuropathy. Neuropathy tends to be slowly progressive, especially underlying cause, in this case, diabetes, it not well-controlled.  Unfortunately, diabetes is poorly controlled, last HbA1c is 10.0.  I encouraged her to work with PCP or endocrinology to maintain better glucose control.  For pain, I offered to increase gabapentin to 900mg  TID or nortriptyline to 50mg  qhs.  It was mutually decided to keep her gabapentin at 800 mg 3 times daily, and increase nortriptyline to 50 mg at bedtime.   2.  Left hand paresthesias, most suggestive of ulnar neuropathy at the elbow. Avoid compression of the nerve and hyperflexion at the elbow to minimize nerve  compression/stretching.  She endorses sleeping with her left arm neck flexed position and will be better about keeping her arm extended.  I offered electrodiagnostic testing to assess the severity of symptoms, which was declined.  She prefers to monitor symptoms and will contact my office, if symptoms get worse.   Follow Up Instructions:  I discussed the assessment and treatment plan with the patient. The patient was provided an opportunity to ask questions and all were answered. The patient agreed with the plan and demonstrated an understanding of the instructions.   The patient was advised to call back or seek an in-person evaluation if the symptoms worsen or if the condition fails to improve as anticipated.  Return to clinic in 4 months  Total Time spent:  45 min   [place], DO

## 2019-06-30 NOTE — Progress Notes (Signed)
Appointment scheduled.

## 2019-07-07 ENCOUNTER — Telehealth: Payer: BLUE CROSS/BLUE SHIELD | Admitting: Neurology

## 2019-07-21 ENCOUNTER — Encounter: Payer: Self-pay | Admitting: Physician Assistant

## 2019-07-21 DIAGNOSIS — M5137 Other intervertebral disc degeneration, lumbosacral region: Secondary | ICD-10-CM

## 2019-07-21 MED ORDER — OXYCODONE-ACETAMINOPHEN 5-325 MG PO TABS: 1.0000 | ORAL_TABLET | Freq: Four times a day (QID) | ORAL | 0 refills | Status: DC | PRN

## 2019-08-18 ENCOUNTER — Ambulatory Visit: Payer: BLUE CROSS/BLUE SHIELD | Admitting: Physician Assistant

## 2019-08-20 ENCOUNTER — Encounter: Payer: Self-pay | Admitting: Physician Assistant

## 2019-08-20 DIAGNOSIS — M5137 Other intervertebral disc degeneration, lumbosacral region: Secondary | ICD-10-CM

## 2019-08-21 MED ORDER — OXYCODONE-ACETAMINOPHEN 5-325 MG PO TABS: 1.0000 | ORAL_TABLET | Freq: Four times a day (QID) | ORAL | 0 refills | Status: DC | PRN

## 2019-09-04 ENCOUNTER — Encounter: Payer: Self-pay | Admitting: Physician Assistant

## 2019-09-04 DIAGNOSIS — M62838 Other muscle spasm: Secondary | ICD-10-CM

## 2019-09-04 MED ORDER — CYCLOBENZAPRINE HCL 5 MG PO TABS: 5.0000 mg | ORAL_TABLET | Freq: Three times a day (TID) | ORAL | 1 refills | Status: DC | PRN

## 2019-09-05 ENCOUNTER — Encounter: Payer: Self-pay | Admitting: Physician Assistant

## 2019-09-20 ENCOUNTER — Encounter: Payer: Self-pay | Admitting: Physician Assistant

## 2019-09-20 DIAGNOSIS — M5137 Other intervertebral disc degeneration, lumbosacral region: Secondary | ICD-10-CM

## 2019-09-20 MED ORDER — OXYCODONE-ACETAMINOPHEN 5-325 MG PO TABS: 1.0000 | ORAL_TABLET | Freq: Four times a day (QID) | ORAL | 0 refills | Status: DC | PRN

## 2019-09-27 ENCOUNTER — Encounter: Payer: Self-pay | Admitting: Physician Assistant

## 2019-09-27 DIAGNOSIS — M7989 Other specified soft tissue disorders: Secondary | ICD-10-CM

## 2019-09-28 MED ORDER — POTASSIUM CHLORIDE CRYS ER 20 MEQ PO TBCR: 20.0000 meq | EXTENDED_RELEASE_TABLET | Freq: Every day | ORAL | 3 refills | Status: DC

## 2019-09-28 MED ORDER — FUROSEMIDE 20 MG PO TABS: 20.0000 mg | ORAL_TABLET | Freq: Every day | ORAL | 3 refills | Status: DC | PRN

## 2019-10-05 NOTE — Progress Notes (Signed)
Patient: Gabriella Marsh Female    DOB: [birth year]   53 y.o.   MRN: [id number] Visit Date: 10/09/2019  Today's Provider: Mar Daring, PA-C   Chief Complaint  Patient presents with  . Follow-up  . Diabetes  . Leg Swelling   Subjective:     HPI   Diabetes Mellitus Type II, Follow-up:   Lab Results  Component Value Date   HGBA1C 7.9 (A) 10/06/2019   HGBA1C 10.0 (A) 05/18/2019   HGBA1C 9.4 (H) 01/23/2019   Last seen for diabetes 5 months ago.  Management since then includes; no changes. She reports good compliance with treatment. She is not having side effects. none Current symptoms include none and have been unchanged. Home blood sugar records: fasting range: 150  Episodes of hypoglycemia? no   Current Insulin Regimen: n/a Most Recent Eye Exam: 02/09/2019 Weight trend: stable Prior visit with dietician: no Current diet: in general, an "unhealthy" diet Current exercise: none  -----------------------------------------------------------  Patient also wants to discuss swelling in her feet and legs.   Allergies  Allergen Reactions  . Celecoxib Shortness Of Breath  . Pregabalin Shortness Of Breath  . Buspar [Buspirone] Other (See Comments)    Tachycardia     Current Outpatient Medications:  .  atorvastatin (LIPITOR) 20 MG tablet, TAKE 1 TABLET DAILY, Disp: 90 tablet, Rfl: 3 .  cyclobenzaprine (FLEXERIL) 5 MG tablet, Take 1 tablet (5 mg total) by mouth 3 (three) times daily as needed for muscle spasms., Disp: 30 tablet, Rfl: 1 .  furosemide (LASIX) 20 MG tablet, Take 2 tablets (40 mg total) by mouth daily as needed for fluid or edema., Disp: 60 tablet, Rfl: 3 .  gabapentin (NEURONTIN) 100 MG capsule, TAKE 2 CAPSULES THREE TIMES A DAY ALONG WITH 600 MG THREE TIMES A DAY FOR A TOTAL DAILY DOSE OF 2400 MG DAILY, Disp: 540 capsule, Rfl: 3 .  gabapentin (NEURONTIN) 600 MG tablet, Take 1 tablet (600 mg total) by mouth 3 (three) times daily., Disp: 270  tablet, Rfl: 4 .  glipiZIDE (GLUCOTROL) 10 MG tablet, TAKE ONE-HALF (1/2) TABLET TWICE A DAY BEFORE MEALS, Disp: 90 tablet, Rfl: 4 .  glucose blood test strip, Check blood sugars twice daily. OneTouch Verio strips., Disp: 200 each, Rfl: 5 .  Insulin Pen Needle (CAREFINE PEN NEEDLES) 32G X 4 MM MISC, Use 1x a day, Disp: 50 each, Rfl: 11 .  Melatonin 3 MG TABS, Take 1 tablet by mouth at bedtime., Disp: , Rfl:  .  metFORMIN (GLUCOPHAGE-XR) 500 MG 24 hr tablet, TAKE 2 TABLETS TWICE A DAY WITH MEALS, Disp: 360 tablet, Rfl: 4 .  Multiple Vitamins-Minerals (MULTIVITAMIN ADULT PO), Take by mouth., Disp: , Rfl:  .  nortriptyline (PAMELOR) 25 MG capsule, TAKE 2 CAPSULES AT BEDTIME, Disp: 180 capsule, Rfl: 4 .  omeprazole (PRILOSEC) 20 MG capsule, Take 1 capsule (20 mg total) by mouth daily., Disp: 30 capsule, Rfl: 3 .  oxyCODONE-acetaminophen (PERCOCET/ROXICET) 5-325 MG tablet, Take 1 tablet by mouth every 6 (six) hours as needed for severe pain., Disp: 120 tablet, Rfl: 0 .  potassium chloride SA (KLOR-CON) 20 MEQ tablet, Take 1 tablet (20 mEq total) by mouth daily. Take any time she takes lasix, Disp: 30 tablet, Rfl: 3 .  triamcinolone cream (KENALOG) 0.1 %, Apply 1 application topically 2 (two) times daily., Disp: 30 g, Rfl: 0 .  triamterene-hydrochlorothiazide (MAXZIDE-25) 37.5-25 MG tablet, TAKE 1 TABLET DAILY, Disp: 90 tablet, Rfl: 3  Review  of Systems  Constitutional: Negative for appetite change, chills, fatigue and fever.  Respiratory: Negative for chest tightness and shortness of breath.   Cardiovascular: Positive for leg swelling. Negative for chest pain and palpitations.  Gastrointestinal: Negative for abdominal pain, nausea and vomiting.  Endocrine: Negative for polydipsia, polyphagia and polyuria.  Neurological: Positive for numbness. Negative for dizziness and weakness.    Social History   Tobacco Use  . Smoking status: Former Smoker    Quit date: 2013    Years since quitting: 7.8  .  Smokeless tobacco: Never Used  Substance Use Topics  . Alcohol use: No    Alcohol/week: 0.0 standard drinks    Frequency: Never      Objective:   There were no vitals taken for this visit. There were no vitals filed for this visit.There is no height or weight on file to calculate BMI.   Physical Exam Vitals signs reviewed.  Constitutional:      General: She is not in acute distress.    Appearance: Normal appearance. She is well-developed. She is obese. She is not ill-appearing or diaphoretic.  Neck:     Musculoskeletal: Normal range of motion and neck supple.     Thyroid: No thyromegaly.     Vascular: No JVD.     Trachea: No tracheal deviation.  Cardiovascular:     Rate and Rhythm: Normal rate and regular rhythm.     Pulses: Normal pulses.     Heart sounds: Normal heart sounds. No murmur. No friction rub. No gallop.   Pulmonary:     Effort: Pulmonary effort is normal. No respiratory distress.     Breath sounds: Normal breath sounds. No wheezing or rales.  Musculoskeletal:     Right lower leg: Edema (1+ pitting) present.     Left lower leg: Edema (1+ pitting) present.  Lymphadenopathy:     Cervical: No cervical adenopathy.  Skin:    Comments: Purplish, nonblanching spots on feet bilaterally at MTP joints and dependent positions  Neurological:     Mental Status: She is alert.      Results for orders placed or performed in visit on [id number]  Basic Metabolic Panel (BMET)  Result Value Ref Range   Glucose 286 (H) 65 - 99 mg/dL   BUN 14 6 - 24 mg/dL   Creatinine, Ser 0.64 0.57 - 1.00 mg/dL   GFR calc non Af Amer 103 >59 mL/min/1.73   GFR calc Af Amer 119 >59 mL/min/1.73   BUN/Creatinine Ratio 22 9 - 23   Sodium 136 134 - 144 mmol/L   Potassium 4.3 3.5 - 5.2 mmol/L   Chloride 93 (L) 96 - 106 mmol/L   CO2 25 20 - 29 mmol/L   Calcium 9.5 8.7 - 10.2 mg/dL  POCT glycosylated hemoglobin (Hb A1C)  Result Value Ref Range   Hemoglobin A1C 7.9 (A) 4.0 - 5.6 %   Est.  average glucose Bld gHb Est-mCnc 177        Assessment & Plan    1. Type 2 diabetes mellitus with diabetic neuropathy, without long-term current use of insulin (HCC) A1c improved to 7.9 from 10.0. Will check labs as below and f/u pending results. Return in 3 months for f/u. - Basic Metabolic Panel (BMET)  2. Leg swelling New occurrences. Goes down overnight. Will check labs as below and f/u pending results. Will start furosemide and potassium prn for leg swelling. I will see her back in 4 weeks to see how this helps.  -  Basic Metabolic Panel (BMET) - furosemide (LASIX) 20 MG tablet; Take 2 tablets (40 mg total) by mouth daily as needed for fluid or edema.  Dispense: 60 tablet; Refill: 3  3. Schamberg's purpura Noted on feet and lower extremities bilaterally. Advised to elevate legs when possible. Using compression stockings would help as well.   4. Multinodular goiter Last imaged and biopsied in 2016. Benign pathology. Now noticing some dysphagia. Will get new [place] as below to see if any nodules have grown in size and may be causing issues. Will f/u pending [place].  - US THYROID; Future     Mar Daring, PA-C  [facility]

## 2019-10-06 ENCOUNTER — Encounter: Payer: Self-pay | Admitting: Physician Assistant

## 2019-10-06 ENCOUNTER — Ambulatory Visit (INDEPENDENT_AMBULATORY_CARE_PROVIDER_SITE_OTHER): Payer: BC Managed Care – PPO | Admitting: Physician Assistant

## 2019-10-06 ENCOUNTER — Other Ambulatory Visit: Payer: Self-pay

## 2019-10-06 DIAGNOSIS — M7989 Other specified soft tissue disorders: Secondary | ICD-10-CM

## 2019-10-06 DIAGNOSIS — E042 Nontoxic multinodular goiter: Secondary | ICD-10-CM | POA: Diagnosis not present

## 2019-10-06 DIAGNOSIS — L817 Pigmented purpuric dermatosis: Secondary | ICD-10-CM | POA: Diagnosis not present

## 2019-10-06 DIAGNOSIS — E1140 Type 2 diabetes mellitus with diabetic neuropathy, unspecified: Secondary | ICD-10-CM

## 2019-10-06 LAB — POCT GLYCOSYLATED HEMOGLOBIN (HGB A1C)
Est. average glucose Bld gHb Est-mCnc: 177
Hemoglobin A1C: 7.9 % — AB (ref 4.0–5.6)

## 2019-10-06 MED ORDER — FUROSEMIDE 20 MG PO TABS: 40.0000 mg | ORAL_TABLET | Freq: Every day | ORAL | 3 refills | Status: DC | PRN

## 2019-10-07 LAB — BASIC METABOLIC PANEL
BUN/Creatinine Ratio: 22 (ref 9–23)
BUN: 14 mg/dL (ref 6–24)
CO2: 25 mmol/L (ref 20–29)
Calcium: 9.5 mg/dL (ref 8.7–10.2)
Chloride: 93 mmol/L — ABNORMAL LOW (ref 96–106)
Creatinine, Ser: 0.64 mg/dL (ref 0.57–1.00)
GFR calc Af Amer: 119 mL/min/{1.73_m2} (ref >59)
GFR calc non Af Amer: 103 mL/min/{1.73_m2} (ref >59)
Glucose: 286 mg/dL — ABNORMAL HIGH (ref 65–99)
Potassium: 4.3 mmol/L (ref 3.5–5.2)
Sodium: 136 mmol/L (ref 134–144)

## 2019-10-09 ENCOUNTER — Encounter: Payer: Self-pay | Admitting: Physician Assistant

## 2019-10-09 ENCOUNTER — Telehealth: Payer: Self-pay

## 2019-10-09 DIAGNOSIS — L817 Pigmented purpuric dermatosis: Secondary | ICD-10-CM | POA: Insufficient documentation

## 2019-10-09 DIAGNOSIS — M7989 Other specified soft tissue disorders: Secondary | ICD-10-CM | POA: Insufficient documentation

## 2019-10-09 NOTE — Telephone Encounter (Signed)
-----   Message from Mar Daring, PA-C sent at 10/09/2019  9:52 AM EDT ----- Kidney function is normal.

## 2019-10-09 NOTE — Telephone Encounter (Signed)
Patient advised as below.  

## 2019-10-11 ENCOUNTER — Encounter: Payer: Self-pay | Admitting: Physician Assistant

## 2019-10-11 DIAGNOSIS — IMO0002 Reserved for concepts with insufficient information to code with codable children: Secondary | ICD-10-CM

## 2019-10-11 DIAGNOSIS — E1165 Type 2 diabetes mellitus with hyperglycemia: Secondary | ICD-10-CM

## 2019-10-11 DIAGNOSIS — E1142 Type 2 diabetes mellitus with diabetic polyneuropathy: Secondary | ICD-10-CM

## 2019-10-11 DIAGNOSIS — E1140 Type 2 diabetes mellitus with diabetic neuropathy, unspecified: Secondary | ICD-10-CM

## 2019-10-12 MED ORDER — GLIPIZIDE 10 MG PO TABS: ORAL_TABLET | ORAL | 3 refills | Status: DC

## 2019-10-12 MED ORDER — GABAPENTIN 100 MG PO CAPS: ORAL_CAPSULE | ORAL | 3 refills | Status: DC

## 2019-10-12 MED ORDER — GABAPENTIN 600 MG PO TABS: 600.0000 mg | ORAL_TABLET | Freq: Three times a day (TID) | ORAL | 3 refills | Status: DC

## 2019-10-12 NOTE — Addendum Note (Signed)
Addended by: Mar Daring on: 10/12/2019 02:29 PM   Modules accepted: Orders

## 2019-10-13 ENCOUNTER — Ambulatory Visit: Payer: BC Managed Care – PPO

## 2019-10-16 ENCOUNTER — Ambulatory Visit
Admission: RE | Admit: 2019-10-16 | Discharge: 2019-10-16 | Disposition: A | Discharge status: Home / Self Care | Payer: BC Managed Care – PPO | Source: Ambulatory Visit | Attending: Physician Assistant | Admitting: Physician Assistant

## 2019-10-16 ENCOUNTER — Other Ambulatory Visit: Payer: Self-pay

## 2019-10-16 DIAGNOSIS — E042 Nontoxic multinodular goiter: Secondary | ICD-10-CM | POA: Diagnosis not present

## 2019-10-17 ENCOUNTER — Encounter: Payer: Self-pay | Admitting: Physician Assistant

## 2019-10-18 ENCOUNTER — Telehealth: Payer: Self-pay

## 2019-10-18 NOTE — Telephone Encounter (Signed)
[id number] 

## 2019-10-18 NOTE — Telephone Encounter (Signed)
-----   Message from Mar Daring, [place] sent at 10/17/2019  5:37 PM EDT ----- US shows that the nodules have increased in size and changes are noted in texture to some also. It is recommended to have 2 of the nodules biopsied again. The nodule that was previously biopsied is stable. I would recommend to refer you down to Dr. Celine Ahr downstairs with [facility] if you are agreeable for the biopsies.

## 2019-10-19 ENCOUNTER — Encounter: Payer: Self-pay | Admitting: Physician Assistant

## 2019-10-19 NOTE — Telephone Encounter (Signed)
Patient was advised by provider and have been messaging though my chart.

## 2019-10-20 ENCOUNTER — Encounter: Payer: Self-pay | Admitting: Physician Assistant

## 2019-10-20 DIAGNOSIS — M5137 Other intervertebral disc degeneration, lumbosacral region: Secondary | ICD-10-CM

## 2019-10-20 MED ORDER — OXYCODONE-ACETAMINOPHEN 5-325 MG PO TABS: 1.0000 | ORAL_TABLET | Freq: Four times a day (QID) | ORAL | 0 refills | Status: DC | PRN

## 2019-11-03 ENCOUNTER — Encounter: Payer: Self-pay | Admitting: Physician Assistant

## 2019-11-06 ENCOUNTER — Ambulatory Visit: Payer: BC Managed Care – PPO | Admitting: Neurology

## 2019-11-19 ENCOUNTER — Encounter: Payer: Self-pay | Admitting: Physician Assistant

## 2019-11-19 DIAGNOSIS — M5137 Other intervertebral disc degeneration, lumbosacral region: Secondary | ICD-10-CM

## 2019-11-20 MED ORDER — OXYCODONE-ACETAMINOPHEN 5-325 MG PO TABS: 1.0000 | ORAL_TABLET | Freq: Four times a day (QID) | ORAL | 0 refills | Status: DC | PRN

## 2019-11-26 ENCOUNTER — Encounter: Payer: Self-pay | Admitting: Physician Assistant

## 2019-11-27 ENCOUNTER — Other Ambulatory Visit: Payer: Self-pay

## 2019-11-28 ENCOUNTER — Encounter: Payer: Self-pay | Admitting: Physician Assistant

## 2019-11-29 ENCOUNTER — Other Ambulatory Visit: Payer: Self-pay

## 2019-11-29 ENCOUNTER — Encounter: Payer: Self-pay | Admitting: Physician Assistant

## 2019-11-29 ENCOUNTER — Telehealth (INDEPENDENT_AMBULATORY_CARE_PROVIDER_SITE_OTHER): Payer: BC Managed Care – PPO | Admitting: Physician Assistant

## 2019-11-29 VITALS — Temp 98.4°F

## 2019-11-29 DIAGNOSIS — U071 COVID-19: Secondary | ICD-10-CM

## 2019-11-29 NOTE — Patient Instructions (Signed)

## 2019-11-29 NOTE — Progress Notes (Signed)
Patient: Gabriella Marsh Female    DOB: February 28, 1966   53 y.o.   MRN: [id number] Visit Date: 11/29/2019  Today's Provider: Mar Daring, PA-C   No chief complaint on file.  Subjective:     Virtual Visit via Video Note  I connected with Gabriella Marsh on 11/29/19 at  1:40 PM EST by a video enabled telemedicine application and verified that I am speaking with the correct person using two identifiers.  Location: Patient: Home Provider: [facility]   I discussed the limitations of evaluation and management by telemedicine and the availability of in person appointments. The patient expressed understanding and agreed to proceed.   HPI  Patient with c/o testing positive for Covid-19. The nurse suggested she contact her provider to do a telemedicine visit. She reports that she started coughing on Saturday. She reports that there are 3 confirmed cases at her work. Her symptoms started last Thursday, 11/23/19. She has been using Coricidin HBP and ibuprofen prn for symptom control. She is trying to stay well hydrated and rest when needed. She is isolating and is home from work. She does have a mild cough and sore throat. She is using [place] sugar free and this is helping. She denies SOB or chest pain.    Allergies  Allergen Reactions  . Celecoxib Shortness Of Breath  . Pregabalin Shortness Of Breath  . Buspar [Buspirone] Other (See Comments)    Tachycardia     Current Outpatient Medications:  .  atorvastatin (LIPITOR) 20 MG tablet, TAKE 1 TABLET DAILY, Disp: 90 tablet, Rfl: 3 .  furosemide (LASIX) 20 MG tablet, Take 2 tablets (40 mg total) by mouth daily as needed for fluid or edema., Disp: 60 tablet, Rfl: 3 .  gabapentin (NEURONTIN) 100 MG capsule, TAKE 2 CAPSULES THREE TIMES A DAY ALONG WITH 600 MG THREE TIMES A DAY FOR A TOTAL DAILY DOSE OF 2400 MG DAILY, Disp: 180 capsule, Rfl: 3 .  gabapentin (NEURONTIN) 600 MG tablet, Take 1 tablet (600 mg total) by mouth 3 (three) times daily.,  Disp: 90 tablet, Rfl: 3 .  glipiZIDE (GLUCOTROL) 10 MG tablet, TAKE ONE-HALF (1/2) TABLET TWICE A DAY BEFORE MEALS, Disp: 30 tablet, Rfl: 3 .  glucose blood test strip, Check blood sugars twice daily. OneTouch Verio strips., Disp: 200 each, Rfl: 5 .  Insulin Pen Needle (CAREFINE PEN NEEDLES) 32G X 4 MM MISC, Use 1x a day, Disp: 50 each, Rfl: 11 .  Melatonin 3 MG TABS, Take 1 tablet by mouth at bedtime., Disp: , Rfl:  .  metFORMIN (GLUCOPHAGE-XR) 500 MG 24 hr tablet, TAKE 2 TABLETS TWICE A DAY WITH MEALS, Disp: 360 tablet, Rfl: 4 .  Multiple Vitamins-Minerals (MULTIVITAMIN ADULT PO), Take by mouth., Disp: , Rfl:  .  nortriptyline (PAMELOR) 25 MG capsule, TAKE 2 CAPSULES AT BEDTIME, Disp: 180 capsule, Rfl: 4 .  omeprazole (PRILOSEC) 20 MG capsule, Take 1 capsule (20 mg total) by mouth daily., Disp: 30 capsule, Rfl: 3 .  oxyCODONE-acetaminophen (PERCOCET/ROXICET) 5-325 MG tablet, Take 1 tablet by mouth every 6 (six) hours as needed for severe pain., Disp: 120 tablet, Rfl: 0 .  potassium chloride SA (KLOR-CON) 20 MEQ tablet, Take 1 tablet (20 mEq total) by mouth daily. Take any time she takes lasix, Disp: 30 tablet, Rfl: 3 .  triamcinolone cream (KENALOG) 0.1 %, Apply 1 application topically 2 (two) times daily., Disp: 30 g, Rfl: 0 .  triamterene-hydrochlorothiazide (MAXZIDE-25) 37.5-25 MG tablet, TAKE 1  TABLET DAILY, Disp: 90 tablet, Rfl: 3 .  cyclobenzaprine (FLEXERIL) 5 MG tablet, Take 1 tablet (5 mg total) by mouth 3 (three) times daily as needed for muscle spasms. (Patient not taking: Reported on 11/29/2019), Disp: 30 tablet, Rfl: 1  Review of Systems  Constitutional: Positive for chills, diaphoresis (once last night), fatigue and fever (low grade).  HENT: Positive for congestion, postnasal drip and sore throat. Negative for ear pain, rhinorrhea, sinus pressure, sinus pain and trouble swallowing.   Eyes: Negative for visual disturbance.  Respiratory: Positive for cough. Negative for chest  tightness, shortness of breath and wheezing.   Cardiovascular: Positive for palpitations ("yesterday" a little bit). Negative for chest pain and leg swelling.  Gastrointestinal: Negative for abdominal pain.  Neurological: Positive for headaches. Negative for dizziness.    Social History   Tobacco Use  . Smoking status: Former Smoker    Quit date: 2013    Years since quitting: 7.9  . Smokeless tobacco: Never Used  Substance Use Topics  . Alcohol use: No    Alcohol/week: 0.0 standard drinks    Frequency: Never      Objective:   Temp 98.4 F (36.9 C) (Oral)  Vitals:   11/29/19 1324  Temp: 98.4 F (36.9 C)  TempSrc: Oral  There is no height or weight on file to calculate BMI.   Physical Exam Vitals signs reviewed.  Constitutional:      General: She is not in acute distress.    Appearance: Normal appearance. She is well-developed. She is not ill-appearing.  HENT:     Head: Normocephalic and atraumatic.  Neck:     Musculoskeletal: Normal range of motion and neck supple.  Pulmonary:     Effort: Pulmonary effort is normal. No respiratory distress.  Neurological:     Mental Status: She is alert.  Psychiatric:        Mood and Affect: Mood normal.        Behavior: Behavior normal.        Thought Content: Thought content normal.        Judgment: Judgment normal.      No results found for any visits on 11/29/19.     Assessment & Plan     1. COVID-19 Symptoms started last Thursday, 11/23/19. She was tested on 11/26/19 and received her results yesterday. She is home isolating until next Wednesday, 12/06/19. Currently she is not having SOB or difficulties breathing. Continue symptomatic treatment and call if symptoms worsen. Reasons to present to ER or return to [place] were discussed and she voiced understanding.   I discussed the assessment and treatment plan with the patient. The patient was provided an opportunity to ask questions and all were answered. The patient agreed  with the plan and demonstrated an understanding of the instructions.   The patient was advised to call back or seek an in-person evaluation if the symptoms worsen or if the condition fails to improve as anticipated.  I provided 12 minutes of non-face-to-face time during this encounter.    Mar Daring, PA-C  [facility]

## 2019-12-04 ENCOUNTER — Encounter: Payer: Self-pay | Admitting: Physician Assistant

## 2019-12-05 ENCOUNTER — Encounter: Payer: Self-pay | Admitting: Physician Assistant

## 2019-12-19 ENCOUNTER — Encounter: Payer: Self-pay | Admitting: Physician Assistant

## 2019-12-19 DIAGNOSIS — F4323 Adjustment disorder with mixed anxiety and depressed mood: Secondary | ICD-10-CM

## 2019-12-19 MED ORDER — CITALOPRAM HYDROBROMIDE 20 MG PO TABS: 20.0000 mg | ORAL_TABLET | Freq: Every day | ORAL | 3 refills | Status: DC

## 2019-12-24 ENCOUNTER — Encounter: Payer: Self-pay | Admitting: Physician Assistant

## 2019-12-24 DIAGNOSIS — M5137 Other intervertebral disc degeneration, lumbosacral region: Secondary | ICD-10-CM

## 2019-12-25 MED ORDER — OXYCODONE-ACETAMINOPHEN 5-325 MG PO TABS: 1.0000 | ORAL_TABLET | Freq: Four times a day (QID) | ORAL | 0 refills | Status: DC | PRN

## 2020-01-03 ENCOUNTER — Encounter: Payer: Self-pay | Admitting: Physician Assistant

## 2020-01-04 ENCOUNTER — Encounter: Payer: Self-pay | Admitting: Physician Assistant

## 2020-01-04 DIAGNOSIS — M5137 Other intervertebral disc degeneration, lumbosacral region: Secondary | ICD-10-CM

## 2020-01-04 MED ORDER — OXYCODONE-ACETAMINOPHEN 5-325 MG PO TABS: 1.0000 | ORAL_TABLET | Freq: Four times a day (QID) | ORAL | 0 refills | Status: DC | PRN

## 2020-01-22 ENCOUNTER — Telehealth (INDEPENDENT_AMBULATORY_CARE_PROVIDER_SITE_OTHER): Payer: 59 | Admitting: Neurology

## 2020-01-22 ENCOUNTER — Other Ambulatory Visit: Payer: Self-pay

## 2020-01-22 VITALS — Ht 70.0 in | Wt 223.0 lb

## 2020-01-22 DIAGNOSIS — E1142 Type 2 diabetes mellitus with diabetic polyneuropathy: Secondary | ICD-10-CM | POA: Diagnosis not present

## 2020-01-22 DIAGNOSIS — R202 Paresthesia of skin: Secondary | ICD-10-CM

## 2020-01-22 NOTE — Progress Notes (Signed)
   Virtual Visit via Video Note The purpose of this virtual visit is to provide medical care while limiting exposure to the novel coronavirus.    Consent was obtained for video visit:  Yes.   Answered questions that patient had about telehealth interaction:  Yes.   I discussed the limitations, risks, security and privacy concerns of performing an evaluation and management service by telemedicine. I also discussed with the patient that there may be a patient responsible charge related to this service. The patient expressed understanding and agreed to proceed.  Pt location: Home Physician Location: office Name of referring provider:  Mar Daring, P* I connected with Gabriella Marsh at patients initiation/request on 01/22/2020 at  3:30 PM EST by video enabled telemedicine application and verified that I am speaking with the correct person using two identifiers. Pt MRN:  [id number] Pt DOB:  November 26, 1966 Video Participants:  Gabriella Marsh   History of Present Illness: This is a 54 y.o. female returning for follow-up of painful diabetic neuropathy and hand paresthesias.  She continues to have numbness/tingling in the feet and lower legs.  She also has achy pain and soreness in the calf, which is improved some by soft tissue massage.  She has been walking a lot lately, because her mother was in the hospital and now under hospice care due to lung cancer.  She is understandably sad with these recent changes in her mother's health.   She complains of bilateral hand pain, described as tingling in the fingertip and achy pain in the joints.  She wears gloves which helps alleviate some of the pain. She also complains of generalized body aches and pain.   Observations/Objective:   Vitals:   01/19/20 0858  Weight: 223 lb (101.2 kg)  Height: 5\' 10"  (1.778 m)   Patient is awake, alert, and appears comfortable.  Oriented x 4.   Extraocular muscles are intact. No ptosis.  Face is symmetric.  Speech is  not dysarthric.  Labs: Lab Results  Component Value Date   HGBA1C 7.9 (A) 10/06/2019    Assessment and Plan:  1.  Painful diabetic neuropathy manifesting with paresthesias and sensory ataxia.  Diabetes is doing much better with HbA1c down from 10 > 7.9  - Continue gabapentin 800mg  three times daily  - Continue nortriptyline 50mg  at bedtime  2.  Bilateral hand paresthesias, ?progression of neuropathy vs. Entrapment neuropathy  - NCS/EMG of bilateral upper extremities   3.  Generalized pain, ?fibromyalgia.  She takes percocet 2-3 tablets daily which is prescribed by her PCP   Follow Up Instructions:   I discussed the assessment and treatment plan with the patient. The patient was provided an opportunity to ask questions and all were answered. The patient agreed with the plan and demonstrated an understanding of the instructions.   The patient was advised to call back or seek an in-person evaluation if the symptoms worsen or if the condition fails to improve as anticipated.  Follow-up in 9 months   [place], DO

## 2020-01-26 ENCOUNTER — Other Ambulatory Visit: Payer: Self-pay | Admitting: Physician Assistant

## 2020-01-26 DIAGNOSIS — IMO0002 Reserved for concepts with insufficient information to code with codable children: Secondary | ICD-10-CM

## 2020-01-26 DIAGNOSIS — E1142 Type 2 diabetes mellitus with diabetic polyneuropathy: Secondary | ICD-10-CM

## 2020-02-05 ENCOUNTER — Encounter: Payer: Self-pay | Admitting: Physician Assistant

## 2020-02-05 DIAGNOSIS — M5137 Other intervertebral disc degeneration, lumbosacral region: Secondary | ICD-10-CM

## 2020-02-05 MED ORDER — OXYCODONE-ACETAMINOPHEN 5-325 MG PO TABS: 1.0000 | ORAL_TABLET | Freq: Four times a day (QID) | ORAL | 0 refills | Status: DC | PRN

## 2020-02-07 ENCOUNTER — Ambulatory Visit (INDEPENDENT_AMBULATORY_CARE_PROVIDER_SITE_OTHER): Payer: 59 | Admitting: Neurology

## 2020-02-07 ENCOUNTER — Other Ambulatory Visit: Payer: Self-pay

## 2020-02-07 DIAGNOSIS — R202 Paresthesia of skin: Secondary | ICD-10-CM | POA: Diagnosis not present

## 2020-02-07 DIAGNOSIS — E1142 Type 2 diabetes mellitus with diabetic polyneuropathy: Secondary | ICD-10-CM

## 2020-02-07 NOTE — Procedures (Signed)
[facility]  [address]  [place], [place] [address] Tel: [phone] Fax:  [phone] Test Date:  02/07/2020  Patient: Gabriella Marsh DOB: [birth year] Physician: Narda Amber, DO  Sex: Female Height: 5\' 10"  Ref Phys: Narda Amber, DO  ID#: [id number] Temp: 35.0C Technician:    Patient Complaints: This is a 54 year old female with diabetes mellitus referred for evaluation of bilateral hand numbness and tingling.  NCV & EMG Findings: Extensive electrodiagnostic testing of the right upper extremity and additional studies of the left shows:  1. Bilateral median and radial sensory responses show reduced amplitude; additionally, the left median sensory response is mildly prolonged.  Bilateral ulnar sensory responses are absent. 2. Bilateral median and ulnar motor responses reduced amplitude with slowed conduction velocity along the course of the nerve.  Additionally, bilateral median and left ulnar motor response shows prolonged latencies. 3. Chronic motor axonal loss changes are seen affecting the intrinsic hand muscles, without accompanied active denervation.  Impression: The electrophysiologic findings are consistent with a chronic sensorimotor polyneuropathy, with axonal and demyelinating features.  Overall, these findings are moderate-to-severe in degree electrically.   ___________________________ Narda Amber, DO    Nerve Conduction Studies Anti Sensory Summary Table   Site NR Peak (ms) Norm Peak (ms) P-T Amp (V) Norm P-T Amp  Left Median Anti Sensory (2nd Digit)  35C  Wrist    4.1 <3.6 3.3 >15  Right Median Anti Sensory (2nd Digit)  35C  Wrist    3.6 <3.6 4.3 >15  Left Radial Anti Sensory (Base 1st Digit)  35C  Wrist    2.5 <2.7 8.4 >14  Right Radial Anti Sensory (Base 1st Digit)  35C  Wrist    2.7 <2.7 12.8 >14  Left Ulnar Anti Sensory (5th Digit)  35C  Wrist NR  <3.1  >10  Right Ulnar Anti Sensory (5th Digit)  35C  Wrist NR  <3.1  >10     Motor Summary Table   Site NR Onset (ms) Norm Onset (ms) O-P Amp (mV) Norm O-P Amp Site1 Site2 Delta-0 (ms) Dist (cm) Vel (m/s) Norm Vel (m/s)  Left Median Motor (Abd Poll Brev)  35C  Wrist    4.1 <4.0 3.0 >6 Elbow Wrist 5.8 27.0 47 >50  Elbow    9.9  2.7         Right Median Motor (Abd Poll Brev)  35C  Wrist    4.2 <4.0 3.3 >6 Elbow Wrist 6.0 29.0 48 >50  Elbow    10.2  3.0         Left Ulnar Motor (Abd Dig Minimi)  35C  Wrist    3.4 <3.1 2.6 >7 B Elbow Wrist 5.5 24.0 44 >50  B Elbow    8.9  2.4  A Elbow B Elbow 2.7 10.0 37 >50  A Elbow    11.6  2.5         Right Ulnar Motor (Abd Dig Minimi)  35C  Wrist    2.4 <3.1 2.2 >7 B Elbow Wrist 4.9 22.0 45 >50  B Elbow    7.3  1.8  A Elbow B Elbow 3.0 10.0 33 >50  A Elbow    10.3  1.9         Right Ulnar (FDI) Motor (1st DI)  35C  Wrist    4.5 <4.5 1.5 >7 B Elbow Wrist 5.0 22.0 44 >50  B Elbow    9.5  0.7  A Elbow B Elbow 2.3  10.0 43 >50  A Elbow    11.8  0.5          EMG   Side Muscle Ins Act Fibs Psw Fasc Number Recrt Dur Dur. Amp Amp. Poly Poly. Comment  Left 1stDorInt Nml Nml Nml Nml 2- Rapid Many 1+ Many 1+ Many 1+ N/A  Left Abd Poll Brev Nml Nml Nml Nml 2- Rapid Many 1+ Many 1+ Many 1+ N/A  Left ABD Dig Min Nml Nml Nml Nml 2- Rapid Many 1+ Many 1+ Many 1+ N/A  Right Ext Indicis Nml Nml Nml Nml 1- Rapid Some 1+ Some 1+ Nml Nml N/A  Right ABD Dig Min Nml Nml Nml Nml 3- Rapid Many 1+ Many 1+ Many 1+ N/A  Right 1stDorInt Nml Nml Nml Nml 3- Rapid Many 1+ Many 1+ Many 1+ N/A  Right Abd Poll Brev Nml Nml Nml Nml 2- Rapid Many 1+ Many 1+ Many 1+ N/A  Right Biceps Nml Nml Nml Nml Nml Nml Nml Nml Nml Nml Nml Nml N/A  Right Deltoid Nml Nml Nml Nml Nml Nml Nml Nml Nml Nml Nml Nml N/A  Right PronatorTeres Nml Nml Nml Nml Nml Nml Nml Nml Nml Nml Nml Nml N/A      Waveforms:

## 2020-02-09 ENCOUNTER — Other Ambulatory Visit: Payer: Self-pay | Admitting: Physician Assistant

## 2020-02-09 DIAGNOSIS — M7989 Other specified soft tissue disorders: Secondary | ICD-10-CM

## 2020-02-26 ENCOUNTER — Encounter: Payer: Self-pay | Admitting: Physician Assistant

## 2020-02-26 ENCOUNTER — Telehealth (INDEPENDENT_AMBULATORY_CARE_PROVIDER_SITE_OTHER): Payer: 59 | Admitting: Physician Assistant

## 2020-02-26 DIAGNOSIS — B029 Zoster without complications: Secondary | ICD-10-CM

## 2020-02-26 MED ORDER — VALACYCLOVIR HCL 1 G PO TABS: 1000.0000 mg | ORAL_TABLET | Freq: Three times a day (TID) | ORAL | 0 refills | Status: DC

## 2020-02-26 NOTE — Patient Instructions (Signed)
Shingles  Shingles is an infection. It gives you a painful skin rash and blisters that have fluid in them. Shingles is caused by the same germ (virus) that causes chickenpox. Shingles only happens in people who:  Have had chickenpox.  Have been given a shot of medicine (vaccine) to protect against chickenpox. Shingles is rare in this group. The first symptoms of shingles may be itching, tingling, or pain in an area on your skin. A rash will show on your skin a few days or weeks later. The rash is likely to be on one side of your body. The rash usually has a shape like a belt or a band. Over time, the rash turns into fluid-filled blisters. The blisters will break open, change into scabs, and dry up. Medicines may:  Help with pain and itching.  Help you get better sooner.  Help to prevent long-term problems. Follow these instructions at home: Medicines  Take over-the-counter and prescription medicines only as told by your doctor.  Put on an anti-itch cream or numbing cream where you have a rash, blisters, or scabs. Do this as told by your doctor. Helping with itching and discomfort   Put cold, wet cloths (cold compresses) on the area of the rash or blisters as told by your doctor.  Cool baths can help you feel better. Try adding baking soda or dry oatmeal to the water to lessen itching. Do not bathe in hot water. Blister and rash care  Keep your rash covered with a loose bandage (dressing).  Wear loose clothing that does not rub on your rash.  Keep your rash and blisters clean. To do this, wash the area with mild soap and cool water as told by your doctor.  Check your rash every day for signs of infection. Check for: ? More redness, swelling, or pain. ? Fluid or blood. ? Warmth. ? Pus or a bad smell.  Do not scratch your rash. Do not pick at your blisters. To help you to not scratch: ? Keep your fingernails clean and cut short. ? Wear gloves or mittens when you sleep, if  scratching is a problem. General instructions  Rest as told by your doctor.  Keep all follow-up visits as told by your doctor. This is important.  Wash your hands often with soap and water. If soap and water are not available, use hand sanitizer. Doing this lowers your chance of getting a skin infection caused by germs (bacteria).  Your infection can cause chickenpox in people who have never had chickenpox or never got a shot of chickenpox vaccine. If you have blisters that did not change into scabs yet, try not to touch other people or be around other people, especially: ? Babies. ? Pregnant women. ? Children who have areas of red, itchy, or rough skin (eczema). ? Very old people who have transplants. ? People who have a long-term (chronic) sickness, like cancer or AIDS. Contact a doctor if:  Your pain does not get better with medicine.  Your pain does not get better after the rash heals.  You have any signs of infection in the rash area. These signs include: ? More redness, swelling, or pain around the rash. ? Fluid or blood coming from the rash. ? The rash area feeling warm to the touch. ? Pus or a bad smell coming from the rash. Get help right away if:  The rash is on your face or nose.  You have pain in your face or pain by   your eye.  You lose feeling on one side of your face.  You have trouble seeing.  You have ear pain, or you have ringing in your ear.  You have a loss of taste.  Your condition gets worse. Summary  Shingles gives you a painful skin rash and blisters that have fluid in them.  Shingles is an infection. It is caused by the same germ (virus) that causes chickenpox.  Keep your rash covered with a loose bandage (dressing). Wear loose clothing that does not rub on your rash.  If you have blisters that did not change into scabs yet, try not to touch other people or be around people. This information is not intended to replace advice given to you by  your health care provider. Make sure you discuss any questions you have with your health care provider. Document Revised: 03/31/2019 Document Reviewed: 08/11/2017 Elsevier Patient Education  2020 Elsevier Inc.  

## 2020-02-26 NOTE — Progress Notes (Signed)
Patient: Gabriella Marsh Female    DOB: 08-04-1966   54 y.o.   MRN: [id number] Visit Date: 02/26/2020  Today's Provider: Mar Daring, PA-C   Chief Complaint  Patient presents with  . Pain    unde left breast   Subjective:     Virtual Visit via Video Note  I connected with Gabriella Marsh on 02/26/20 at  6:20 PM EST by a video enabled telemedicine application and verified that I am speaking with the correct person using two identifiers.  Location: Patient: Home Provider: [facility]   I discussed the limitations of evaluation and management by telemedicine and the availability of in person appointments. The patient expressed understanding and agreed to proceed.  HPI  Patient with c/o pain under her left breast right in tip of the rib cage and feels inflamed. No rash. Just feels tender to touched and inflamed. She does report a burning and tingling pain that radiates from just under the left breast to the left arm pit in a direct line. She has been under a lot of stress with her mothers declining health.  Allergies  Allergen Reactions  . Celecoxib Shortness Of Breath  . Pregabalin Shortness Of Breath  . Buspar [Buspirone] Other (See Comments)    Tachycardia  . Citalopram Cough     Current Outpatient Medications:  .  atorvastatin (LIPITOR) 20 MG tablet, TAKE 1 TABLET DAILY, Disp: 90 tablet, Rfl: 3 .  cyclobenzaprine (FLEXERIL) 5 MG tablet, Take 1 tablet (5 mg total) by mouth 3 (three) times daily as needed for muscle spasms., Disp: 30 tablet, Rfl: 1 .  furosemide (LASIX) 20 MG tablet, TAKE 2 TABLETS(40 MG) BY MOUTH DAILY AS NEEDED FOR FLUID RETENTION OR SWELLING, Disp: 60 tablet, Rfl: 1 .  gabapentin (NEURONTIN) 100 MG capsule, TAKE 2 CAPSULES THREE TIMES A DAY ALONG WITH 600 MG THREE TIMES A DAY FOR A TOTAL DAILY DOSE OF 2400 MG DAILY, Disp: 180 capsule, Rfl: 3 .  gabapentin (NEURONTIN) 600 MG tablet, Take 1 tablet (600 mg total) by mouth 3 (three) times daily., Disp: 90  tablet, Rfl: 3 .  glipiZIDE (GLUCOTROL) 10 MG tablet, TAKE ONE-HALF (1/2) TABLET TWICE A DAY BEFORE MEALS, Disp: 30 tablet, Rfl: 3 .  glucose blood test strip, Check blood sugars twice daily. OneTouch Verio strips., Disp: 200 each, Rfl: 5 .  Insulin Pen Needle (CAREFINE PEN NEEDLES) 32G X 4 MM MISC, Use 1x a day, Disp: 50 each, Rfl: 11 .  Melatonin 3 MG TABS, Take 1 tablet by mouth at bedtime., Disp: , Rfl:  .  metFORMIN (GLUCOPHAGE-XR) 500 MG 24 hr tablet, TAKE 2 TABLETS TWICE A DAY WITH MEALS, Disp: 120 tablet, Rfl: 0 .  nortriptyline (PAMELOR) 25 MG capsule, TAKE 2 CAPSULES AT BEDTIME, Disp: 180 capsule, Rfl: 4 .  omeprazole (PRILOSEC) 20 MG capsule, Take 1 capsule (20 mg total) by mouth daily., Disp: 30 capsule, Rfl: 3 .  oxyCODONE-acetaminophen (PERCOCET/ROXICET) 5-325 MG tablet, Take 1 tablet by mouth every 6 (six) hours as needed for severe pain., Disp: 120 tablet, Rfl: 0 .  potassium chloride SA (KLOR-CON) 20 MEQ tablet, Take 1 tablet (20 mEq total) by mouth daily. Take any time she takes lasix, Disp: 30 tablet, Rfl: 3 .  triamcinolone cream (KENALOG) 0.1 %, Apply 1 application topically 2 (two) times daily., Disp: 30 g, Rfl: 0 .  triamterene-hydrochlorothiazide (MAXZIDE-25) 37.5-25 MG tablet, TAKE 1 TABLET DAILY, Disp: 90 tablet, Rfl: 3 .  Multiple Vitamins-Minerals (MULTIVITAMIN ADULT PO), Take by mouth., Disp: , Rfl:   Review of Systems  Constitutional: Negative.   Respiratory: Negative.   Cardiovascular: Positive for chest pain (under left breast). Negative for palpitations and leg swelling.  Gastrointestinal: Negative for abdominal pain, constipation, diarrhea and nausea.  Musculoskeletal: Positive for myalgias.  Skin: Negative for rash.  Neurological: Positive for numbness (chronic in hands and legs, recently had EMG on hands to show that has progressed). Negative for weakness.    Social History   Tobacco Use  . Smoking status: Former Smoker    Quit date: 2013    Years  since quitting: 8.1  . Smokeless tobacco: Never Used  Substance Use Topics  . Alcohol use: No    Alcohol/week: 0.0 standard drinks      Objective:   There were no vitals taken for this visit. There were no vitals filed for this visit.There is no height or weight on file to calculate BMI.   Physical Exam Vitals reviewed.  Constitutional:      General: She is not in acute distress.    Appearance: Normal appearance. She is well-developed. She is not ill-appearing.  HENT:     Head: Normocephalic and atraumatic.  Pulmonary:     Effort: Pulmonary effort is normal. No respiratory distress.  Musculoskeletal:     Cervical back: Normal range of motion and neck supple.  Skin:    Findings: No rash.  Neurological:     Mental Status: She is alert.  Psychiatric:        Mood and Affect: Mood normal.        Behavior: Behavior normal.        Thought Content: Thought content normal.        Judgment: Judgment normal.      No results found for any visits on 02/26/20.     Assessment & Plan     1. Herpes zoster without complication Suspicious for possible shingles. Will treat with Valtrex as below. Call if worsening or not responding to treatment.  - valACYclovir (VALTREX) 1000 MG tablet; Take 1 tablet (1,000 mg total) by mouth 3 (three) times daily.  Dispense: 21 tablet; Refill: 0   I discussed the assessment and treatment plan with the patient. The patient was provided an opportunity to ask questions and all were answered. The patient agreed with the plan and demonstrated an understanding of the instructions.   The patient was advised to call back or seek an in-person evaluation if the symptoms worsen or if the condition fails to improve as anticipated.  I provided 12 minutes of non-face-to-face time during this encounter.    Mar Daring, PA-C  [facility]

## 2020-03-03 ENCOUNTER — Encounter: Payer: Self-pay | Admitting: Physician Assistant

## 2020-03-03 DIAGNOSIS — M5137 Other intervertebral disc degeneration, lumbosacral region: Secondary | ICD-10-CM

## 2020-03-04 MED ORDER — OXYCODONE-ACETAMINOPHEN 5-325 MG PO TABS: 1.0000 | ORAL_TABLET | Freq: Four times a day (QID) | ORAL | 0 refills | Status: DC | PRN

## 2020-03-05 ENCOUNTER — Encounter: Payer: Self-pay | Admitting: Physician Assistant

## 2020-03-05 DIAGNOSIS — E1140 Type 2 diabetes mellitus with diabetic neuropathy, unspecified: Secondary | ICD-10-CM

## 2020-03-05 DIAGNOSIS — G2581 Restless legs syndrome: Secondary | ICD-10-CM

## 2020-03-05 MED ORDER — GABAPENTIN 600 MG PO TABS: 600.0000 mg | ORAL_TABLET | Freq: Three times a day (TID) | ORAL | 3 refills | Status: DC

## 2020-03-05 MED ORDER — NORTRIPTYLINE HCL 25 MG PO CAPS: 50.0000 mg | ORAL_CAPSULE | Freq: Every day | ORAL | 4 refills | Status: DC

## 2020-03-12 ENCOUNTER — Other Ambulatory Visit: Payer: Self-pay | Admitting: Physician Assistant

## 2020-03-12 DIAGNOSIS — M7989 Other specified soft tissue disorders: Secondary | ICD-10-CM

## 2020-03-18 ENCOUNTER — Encounter: Payer: BC Managed Care – PPO | Admitting: Physician Assistant

## 2020-03-28 ENCOUNTER — Encounter: Payer: Self-pay | Admitting: Physician Assistant

## 2020-03-28 NOTE — Telephone Encounter (Signed)
Gabriella Marsh,  Can we call and schedule her for an appt with EKG please?  JB

## 2020-03-29 ENCOUNTER — Telehealth: Payer: Self-pay

## 2020-03-29 NOTE — Telephone Encounter (Signed)
Patient returned call and scheduled with PCP for EKG on Monday. Patient unable to come in today due to leaving work early to receive COVID vaccine shot.

## 2020-03-29 NOTE — Telephone Encounter (Signed)
Called patient and left a message to call to schedule an appointment.

## 2020-04-01 ENCOUNTER — Other Ambulatory Visit: Payer: Self-pay

## 2020-04-01 ENCOUNTER — Ambulatory Visit
Admission: RE | Admit: 2020-04-01 | Discharge: 2020-04-01 | Disposition: A | Discharge status: Home / Self Care | Payer: 59 | Source: Ambulatory Visit | Attending: Physician Assistant | Admitting: Physician Assistant

## 2020-04-01 ENCOUNTER — Ambulatory Visit (INDEPENDENT_AMBULATORY_CARE_PROVIDER_SITE_OTHER): Payer: 59 | Admitting: Physician Assistant

## 2020-04-01 ENCOUNTER — Ambulatory Visit
Admission: RE | Admit: 2020-04-01 | Discharge: 2020-04-01 | Disposition: A | Discharge status: Home / Self Care | Payer: 59 | Attending: Physician Assistant | Admitting: Physician Assistant

## 2020-04-01 ENCOUNTER — Encounter: Payer: Self-pay | Admitting: Physician Assistant

## 2020-04-01 VITALS — BP 107/71 | HR 94 | Temp 97.6°F | Resp 16 | Ht 71.0 in | Wt 216.6 lb

## 2020-04-01 DIAGNOSIS — R52 Pain, unspecified: Secondary | ICD-10-CM | POA: Diagnosis not present

## 2020-04-01 DIAGNOSIS — E1165 Type 2 diabetes mellitus with hyperglycemia: Secondary | ICD-10-CM

## 2020-04-01 DIAGNOSIS — K219 Gastro-esophageal reflux disease without esophagitis: Secondary | ICD-10-CM

## 2020-04-01 DIAGNOSIS — E7800 Pure hypercholesterolemia, unspecified: Secondary | ICD-10-CM | POA: Diagnosis not present

## 2020-04-01 DIAGNOSIS — IMO0002 Reserved for concepts with insufficient information to code with codable children: Secondary | ICD-10-CM

## 2020-04-01 DIAGNOSIS — R0781 Pleurodynia: Secondary | ICD-10-CM | POA: Diagnosis not present

## 2020-04-01 DIAGNOSIS — M62838 Other muscle spasm: Secondary | ICD-10-CM

## 2020-04-01 DIAGNOSIS — I10 Essential (primary) hypertension: Secondary | ICD-10-CM

## 2020-04-01 DIAGNOSIS — M7989 Other specified soft tissue disorders: Secondary | ICD-10-CM

## 2020-04-01 DIAGNOSIS — G2581 Restless legs syndrome: Secondary | ICD-10-CM

## 2020-04-01 DIAGNOSIS — W57XXXA Bitten or stung by nonvenomous insect and other nonvenomous arthropods, initial encounter: Secondary | ICD-10-CM

## 2020-04-01 DIAGNOSIS — E1142 Type 2 diabetes mellitus with diabetic polyneuropathy: Secondary | ICD-10-CM

## 2020-04-01 DIAGNOSIS — E1140 Type 2 diabetes mellitus with diabetic neuropathy, unspecified: Secondary | ICD-10-CM

## 2020-04-01 DIAGNOSIS — F5101 Primary insomnia: Secondary | ICD-10-CM

## 2020-04-01 MED ORDER — GLIPIZIDE 10 MG PO TABS: ORAL_TABLET | ORAL | 3 refills | Status: DC

## 2020-04-01 MED ORDER — TRIAMTERENE-HCTZ 37.5-25 MG PO TABS: 1.0000 | ORAL_TABLET | Freq: Every day | ORAL | 3 refills | Status: DC

## 2020-04-01 MED ORDER — ATORVASTATIN CALCIUM 20 MG PO TABS: 20.0000 mg | ORAL_TABLET | Freq: Every day | ORAL | 3 refills | Status: DC

## 2020-04-01 MED ORDER — GABAPENTIN 600 MG PO TABS: 600.0000 mg | ORAL_TABLET | Freq: Three times a day (TID) | ORAL | 3 refills | Status: DC

## 2020-04-01 MED ORDER — OMEPRAZOLE 20 MG PO CPDR: 20.0000 mg | DELAYED_RELEASE_CAPSULE | Freq: Every day | ORAL | 3 refills | Status: DC

## 2020-04-01 MED ORDER — GABAPENTIN 100 MG PO CAPS: ORAL_CAPSULE | ORAL | 3 refills | Status: DC

## 2020-04-01 MED ORDER — TRIAMCINOLONE ACETONIDE 0.1 % EX CREA: 1.0000 "application " | TOPICAL_CREAM | Freq: Two times a day (BID) | CUTANEOUS | 0 refills | Status: DC

## 2020-04-01 MED ORDER — METFORMIN HCL ER 500 MG PO TB24: ORAL_TABLET | ORAL | 3 refills | Status: DC

## 2020-04-01 MED ORDER — POTASSIUM CHLORIDE CRYS ER 20 MEQ PO TBCR: 20.0000 meq | EXTENDED_RELEASE_TABLET | Freq: Every day | ORAL | 3 refills | Status: DC

## 2020-04-01 MED ORDER — CYCLOBENZAPRINE HCL 5 MG PO TABS: 5.0000 mg | ORAL_TABLET | Freq: Three times a day (TID) | ORAL | 1 refills | Status: DC | PRN

## 2020-04-01 MED ORDER — NORTRIPTYLINE HCL 25 MG PO CAPS: 50.0000 mg | ORAL_CAPSULE | Freq: Every day | ORAL | 3 refills | Status: DC

## 2020-04-01 MED ORDER — FUROSEMIDE 20 MG PO TABS: ORAL_TABLET | ORAL | 3 refills | Status: DC

## 2020-04-01 MED ORDER — TRAZODONE HCL 50 MG PO TABS: 50.0000 mg | ORAL_TABLET | Freq: Every evening | ORAL | 3 refills | Status: DC | PRN

## 2020-04-01 NOTE — Progress Notes (Signed)
Established patient visit      Patient: Gabriella Marsh   DOB: 07-May-1966   54 y.o. Female  MRN: [id number] Visit Date: 04/05/2020  Today's healthcare provider: Mar Daring, PA-C  Subjective:    Chief Complaint  Patient presents with  . Pain   HPI Patient here with c/o left pain her left side under her breast. Reports that is a constant pain underneath her breast on the left side. She has tried Valtrex for possible shingles. This did not provide any relief.   Of note her mother recently passed away 2-3 weeks ago from cancer.   Patient Active Problem List   Diagnosis Date Noted  . Schamberg's purpura 10/09/2019  . Leg swelling 10/09/2019  . Depression, major, single episode, moderate ([place]) 01/23/2019  . Benign positional vertigo 02/03/2016  . Uncontrolled type 2 diabetes mellitus with peripheral neuropathy ([place]) 07/22/2015  . Common migraine with intractable migraine 06/18/2015  . Anxiety 05/04/2015  . Adaptation reaction 05/03/2015  . DDD (degenerative disc disease), lumbosacral 05/03/2015  . Type 2 diabetes mellitus with diabetic neuropathy, without long-term current use of insulin ([place]) 05/03/2015  . Hypercholesteremia 05/03/2015  . Insomnia due to medical condition 05/03/2015  . Adiposity 05/03/2015  . Disorder of peripheral nervous system 05/03/2015  . Snores 05/03/2015  . Diabetic neuropathy ([place]) 03/06/2015  . Essential hypertension 03/06/2015  . Elevated liver function tests 03/06/2015  . Obesity 03/06/2015  . GERD (gastroesophageal reflux disease) 03/06/2015  . Multinodular goiter 03/06/2015  . RLS (restless legs syndrome) 03/06/2015  . Leg cramps 03/06/2015   Past Medical History:  Diagnosis Date  . Arthritis    knees  . Degenerative disc disease, lumbar   . Diabetes mellitus without complication ([place])   . GERD (gastroesophageal reflux disease)   . Headache    daily - AM (has not been able to have SPG blocks lately)  . Hyperlipidemia   .  Hypertension   . Neuropathy    feet  . Vertigo    3-4x/yr   Allergies  Allergen Reactions  . Celecoxib Shortness Of Breath  . Pregabalin Shortness Of Breath  . Buspar [Buspirone] Other (See Comments)    Tachycardia  . Citalopram Cough       Medications: Outpatient Medications Prior to Visit  Medication Sig  . glucose blood test strip Check blood sugars twice daily. OneTouch Verio strips.  . Insulin Pen Needle (CAREFINE PEN NEEDLES) 32G X 4 MM MISC Use 1x a day  . Melatonin 3 MG TABS Take 1 tablet by mouth at bedtime.  . Multiple Vitamins-Minerals (MULTIVITAMIN ADULT PO) Take by mouth.  . [DISCONTINUED] atorvastatin (LIPITOR) 20 MG tablet TAKE 1 TABLET DAILY  . [DISCONTINUED] cyclobenzaprine (FLEXERIL) 5 MG tablet Take 1 tablet (5 mg total) by mouth 3 (three) times daily as needed for muscle spasms.  . [DISCONTINUED] furosemide (LASIX) 20 MG tablet TAKE 2 TABLETS(40 MG) BY MOUTH DAILY AS NEEDED FOR FLUID RETENTION OR SWELLING  . [DISCONTINUED] gabapentin (NEURONTIN) 100 MG capsule TAKE 2 CAPSULES THREE TIMES A DAY ALONG WITH 600 MG THREE TIMES A DAY FOR A TOTAL DAILY DOSE OF 2400 MG DAILY  . [DISCONTINUED] gabapentin (NEURONTIN) 600 MG tablet Take 1 tablet (600 mg total) by mouth 3 (three) times daily.  . [DISCONTINUED] glipiZIDE (GLUCOTROL) 10 MG tablet TAKE ONE-HALF (1/2) TABLET TWICE A DAY BEFORE MEALS  . [DISCONTINUED] metFORMIN (GLUCOPHAGE-XR) 500 MG 24 hr tablet TAKE 2 TABLETS TWICE A DAY WITH MEALS  . [DISCONTINUED] nortriptyline (  PAMELOR) 25 MG capsule Take 2 capsules (50 mg total) by mouth at bedtime.  . [DISCONTINUED] omeprazole (PRILOSEC) 20 MG capsule Take 1 capsule (20 mg total) by mouth daily.  . [DISCONTINUED] oxyCODONE-acetaminophen (PERCOCET/ROXICET) 5-325 MG tablet Take 1 tablet by mouth every 6 (six) hours as needed for severe pain.  . [DISCONTINUED] potassium chloride SA (KLOR-CON) 20 MEQ tablet Take 1 tablet (20 mEq total) by mouth daily. Take any time she takes  lasix  . [DISCONTINUED] triamcinolone cream (KENALOG) 0.1 % Apply 1 application topically 2 (two) times daily.  . [DISCONTINUED] triamterene-hydrochlorothiazide (MAXZIDE-25) 37.5-25 MG tablet TAKE 1 TABLET DAILY  . [DISCONTINUED] valACYclovir (VALTREX) 1000 MG tablet Take 1 tablet (1,000 mg total) by mouth 3 (three) times daily.   No facility-administered medications prior to visit.    Review of Systems  Constitutional: Negative.   Respiratory: Negative.   Cardiovascular: Positive for chest pain (under left breast). Negative for palpitations and leg swelling.  Neurological: Positive for numbness.  Psychiatric/Behavioral: Positive for dysphoric mood.    Last CBC Lab Results  Component Value Date   WBC 9.9 01/23/2019   HGB 13.5 01/23/2019   HCT 38.2 01/23/2019   MCV 81 01/23/2019   MCH 28.8 01/23/2019   RDW 12.9 01/23/2019   PLT 348 [id number]   Last metabolic panel Lab Results  Component Value Date   GLUCOSE 286 (H) 10/06/2019   NA 136 10/06/2019   K 4.3 10/06/2019   CL 93 (L) 10/06/2019   CO2 25 10/06/2019   BUN 14 10/06/2019   CREATININE 0.64 10/06/2019   GFRNONAA 103 10/06/2019   GFRAA 119 10/06/2019   CALCIUM 9.5 10/06/2019   PROT 7.1 01/23/2019   ALBUMIN 4.3 01/23/2019   LABGLOB 2.8 01/23/2019   AGRATIO 1.5 01/23/2019   BILITOT 0.7 01/23/2019   ALKPHOS 91 01/23/2019   AST 28 01/23/2019   ALT 38 (H) 01/23/2019   ANIONGAP 9 06/28/2015   Last hemoglobin A1c Lab Results  Component Value Date   HGBA1C 7.9 (A) 10/06/2019   Last thyroid functions Lab Results  Component Value Date   TSH 1.530 01/23/2019        Objective:    BP 107/71 (BP Location: Left Arm, Patient Position: Sitting, Cuff Size: Large)   Pulse 94   Temp 97.6 F (36.4 C) (Temporal)   Resp 16   Ht 5\' 11"  (1.803 m)   Wt 216 lb 9.6 oz (98.2 kg)   BMI 30.21 kg/m  BP Readings from Last 3 Encounters:  04/01/20 107/71  05/18/19 122/81  01/23/19 134/85   Wt Readings from Last 3  Encounters:  04/01/20 216 lb 9.6 oz (98.2 kg)  01/19/20 223 lb (101.2 kg)  06/29/19 225 lb (102.1 kg)      Physical Exam Vitals reviewed.  Constitutional:      General: She is not in acute distress.    Appearance: Normal appearance. She is well-developed. She is obese. She is not ill-appearing or diaphoretic.  Neck:     Thyroid: No thyromegaly.     Vascular: No JVD.     Trachea: No tracheal deviation.  Cardiovascular:     Rate and Rhythm: Normal rate and regular rhythm.     Pulses: Normal pulses.     Heart sounds: Normal heart sounds. No murmur. No friction rub. No gallop.   Pulmonary:     Effort: Pulmonary effort is normal. No respiratory distress.     Breath sounds: Normal breath sounds. No wheezing or rales.  Chest:  Chest wall: Tenderness present.    Musculoskeletal:     Cervical back: Normal range of motion and neck supple.  Lymphadenopathy:     Cervical: No cervical adenopathy.  Skin:    Findings: No rash.  Neurological:     Mental Status: She is alert.    CLINICAL DATA:  54 year old female with history of pain around the left fifth through sixth ribs.  EXAM: LEFT RIBS - 2 VIEW  COMPARISON:  No priors.  FINDINGS: No fracture or other bone lesions are seen involving the ribs.  IMPRESSION: Negative.   Electronically Signed   By: Vinnie Langton M.D.   On: 04/02/2020 05:44  CLINICAL DATA:  Left rib pain.  EXAM: THORACIC SPINE - 3 VIEWS  COMPARISON:  MRI lumbar spine 06/06/2015. Chest x-ray 11/13/2008.  FINDINGS: Diffuse osteopenia and degenerative change. Mild scoliosis. Mild L1 compression again noted without interim change. No acute bony abnormality identified. Surgical clips right upper quadrant.  IMPRESSION: Diffuse osteopenia degenerative change. Mild scoliosis. Mild L1 compression, no change. No acute abnormality identified.   Electronically Signed   By: Marcello Moores  Register   On: 04/02/2020 05:46  No results found for  any visits on 04/01/20.    Assessment & Plan:    1. Pain Under left breast. EKG obtained to r/o cardiac, even though was low suspicion. EKG showed SR rate of 98 with low voltage. Unsure of source. Possible intercostal muscle strain vs costochondritis. No h/o cough. No rash. Xray unremarkable with exception of mild scoliosis and DDD. Possible nerve pain radiating from DDD but she is on max dose gabapentin and Norco for other neuropathy already.   - EKG 12-Lead  2. Hypercholesteremia Stable. Diagnosis pulled for medication refill. Continue current medical treatment plan. - atorvastatin (LIPITOR) 20 MG tablet; Take 1 tablet (20 mg total) by mouth daily.  Dispense: 90 tablet; Refill: 3  3. Muscle spasm Stable. Diagnosis pulled for medication refill. Continue current medical treatment plan. - cyclobenzaprine (FLEXERIL) 5 MG tablet; Take 1 tablet (5 mg total) by mouth 3 (three) times daily as needed for muscle spasms.  Dispense: 270 tablet; Refill: 1  4. Leg swelling Stable. Diagnosis pulled for medication refill. Continue current medical treatment plan. - furosemide (LASIX) 20 MG tablet; TAKE 2 TABLETS(40 MG) BY MOUTH DAILY AS NEEDED FOR FLUID RETENTION OR SWELLING  Dispense: 180 tablet; Refill: 3 - potassium chloride SA (KLOR-CON) 20 MEQ tablet; Take 1 tablet (20 mEq total) by mouth daily. Take any time she takes lasix  Dispense: 90 tablet; Refill: 3  5. Type 2 diabetes mellitus with diabetic neuropathy, without long-term current use of insulin ([facility]) Stable. Diagnosis pulled for medication refill. Continue current medical treatment plan. - gabapentin (NEURONTIN) 100 MG capsule; TAKE 2 CAPSULES THREE TIMES A DAY ALONG WITH 600 MG THREE TIMES A DAY FOR A TOTAL DAILY DOSE OF 2400 MG DAILY  Dispense: 360 capsule; Refill: 3 - glipiZIDE (GLUCOTROL) 10 MG tablet; TAKE ONE-HALF (1/2) TABLET TWICE A DAY BEFORE MEALS  Dispense: 45 tablet; Refill: 3  6. Uncontrolled type 2 diabetes mellitus with  peripheral neuropathy ([facility]) Stable. Diagnosis pulled for medication refill. Continue current medical treatment plan. - glipiZIDE (GLUCOTROL) 10 MG tablet; TAKE ONE-HALF (1/2) TABLET TWICE A DAY BEFORE MEALS  Dispense: 45 tablet; Refill: 3 - metFORMIN (GLUCOPHAGE-XR) 500 MG 24 hr tablet; TAKE 2 TABLETS TWICE A DAY WITH MEALS  Dispense: 360 tablet; Refill: 3  7. RLS (restless legs syndrome) Stable. Diagnosis pulled for medication refill. Continue current medical  treatment plan. - nortriptyline (PAMELOR) 25 MG capsule; Take 2 capsules (50 mg total) by mouth at bedtime.  Dispense: 180 capsule; Refill: 3  8. Gastroesophageal reflux disease Stable. Diagnosis pulled for medication refill. Continue current medical treatment plan. - omeprazole (PRILOSEC) 20 MG capsule; Take 1 capsule (20 mg total) by mouth daily.  Dispense: 90 capsule; Refill: 3  9. Bug bite, initial encounter Noted on leg. Triamcinolone for itching and irritation.  - triamcinolone cream (KENALOG) 0.1 %; Apply 1 application topically 2 (two) times daily.  Dispense: 80 g; Refill: 0  10. Essential hypertension Stable. Diagnosis pulled for medication refill. Continue current medical treatment plan. - triamterene-hydrochlorothiazide (MAXZIDE-25) 37.5-25 MG tablet; Take 1 tablet by mouth daily.  Dispense: 90 tablet; Refill: 3  11. Primary insomnia New since her mother passed. Will try trazodone as below. Call if not improving.  - traZODone (DESYREL) 50 MG tablet; Take 1-2 tablets (50-100 mg total) by mouth at bedtime as needed for sleep.  Dispense: 60 tablet; Refill: 3  12. Rib pain on left side See above medical treatment plan for # 1. - DG Thoracic Spine W/Swimmers; Future - DG Ribs Unilateral Left; Future      Rubye Beach  [facility] [place] [phone] (phone) [phone] (fax)  [place]

## 2020-04-01 NOTE — Patient Instructions (Signed)
Trazodone tablets What is this medicine? TRAZODONE (TRAZ oh done) is used to treat depression. This medicine may be used for other purposes; ask your health care provider or pharmacist if you have questions. COMMON BRAND NAME(S): Desyrel What should I tell my health care provider before I take this medicine? They need to know if you have any of these conditions:  attempted suicide or thinking about it  bipolar disorder  bleeding problems  glaucoma  heart disease, or previous heart attack  irregular heart beat  kidney or liver disease  low levels of sodium in the blood  an unusual or allergic reaction to trazodone, other medicines, foods, dyes or preservatives  pregnant or trying to get pregnant  breast-feeding How should I use this medicine? Take this medicine by mouth with a glass of water. Follow the directions on the prescription label. Take this medicine shortly after a meal or a light snack. Take your medicine at regular intervals. Do not take your medicine more often than directed. Do not stop taking this medicine suddenly except upon the advice of your doctor. Stopping this medicine too quickly may cause serious side effects or your condition may worsen. A special MedGuide will be given to you by the pharmacist with each prescription and refill. Be sure to read this information carefully each time. Talk to your pediatrician regarding the use of this medicine in children. Special care may be needed. Overdosage: If you think you have taken too much of this medicine contact a poison control center or emergency room at once. NOTE: This medicine is only for you. Do not share this medicine with others. What if I miss a dose? If you miss a dose, take it as soon as you can. If it is almost time for your next dose, take only that dose. Do not take double or extra doses. What may interact with this medicine? Do not take this medicine with any of the following  medications:  certain medicines for fungal infections like fluconazole, itraconazole, ketoconazole, posaconazole, voriconazole  cisapride  dronedarone  linezolid  MAOIs like Carbex, Eldepryl, Marplan, Nardil, and Parnate  mesoridazine  methylene blue (injected into a vein)  pimozide  saquinavir  thioridazine This medicine may also interact with the following medications:  alcohol  antiviral medicines for HIV or AIDS  aspirin and aspirin-like medicines  barbiturates like phenobarbital  certain medicines for blood pressure, heart disease, irregular heart beat  certain medicines for depression, anxiety, or psychotic disturbances  certain medicines for migraine headache like almotriptan, eletriptan, frovatriptan, naratriptan, rizatriptan, sumatriptan, zolmitriptan  certain medicines for seizures like carbamazepine and phenytoin  certain medicines for sleep  certain medicines that treat or prevent blood clots like dalteparin, enoxaparin, warfarin  digoxin  fentanyl  lithium  NSAIDS, medicines for pain and inflammation, like ibuprofen or naproxen  other medicines that prolong the QT interval (cause an abnormal heart rhythm) like dofetilide  rasagiline  supplements like St. John's wort, kava kava, valerian  tramadol  tryptophan This list may not describe all possible interactions. Give your health care provider a list of all the medicines, herbs, non-prescription drugs, or dietary supplements you use. Also tell them if you smoke, drink alcohol, or use illegal drugs. Some items may interact with your medicine. What should I watch for while using this medicine? Tell your doctor if your symptoms do not get better or if they get worse. Visit your doctor or health care professional for regular checks on your progress. Because it may take   several weeks to see the full effects of this medicine, it is important to continue your treatment as prescribed by your  doctor. Patients and their families should watch out for new or worsening thoughts of suicide or depression. Also watch out for sudden changes in feelings such as feeling anxious, agitated, panicky, irritable, hostile, aggressive, impulsive, severely restless, overly excited and hyperactive, or not being able to sleep. If this happens, especially at the beginning of treatment or after a change in dose, call your health care professional. You may get drowsy or dizzy. Do not drive, use machinery, or do anything that needs mental alertness until you know how this medicine affects you. Do not stand or sit up quickly, especially if you are an older patient. This reduces the risk of dizzy or fainting spells. Alcohol may interfere with the effect of this medicine. Avoid alcoholic drinks. This medicine may cause dry eyes and blurred vision. If you wear contact lenses you may feel some discomfort. Lubricating drops may help. See your eye doctor if the problem does not go away or is severe. Your mouth may get dry. Chewing sugarless gum, sucking hard candy and drinking plenty of water may help. Contact your doctor if the problem does not go away or is severe. What side effects may I notice from receiving this medicine? Side effects that you should report to your doctor or health care professional as soon as possible:  allergic reactions like skin rash, itching or hives, swelling of the face, lips, or tongue  elevated mood, decreased need for sleep, racing thoughts, impulsive behavior  confusion  fast, irregular heartbeat  feeling faint or lightheaded, falls  feeling agitated, angry, or irritable  loss of balance or coordination  painful or prolonged erections  restlessness, pacing, inability to keep still  suicidal thoughts or other mood changes  tremors  trouble sleeping  seizures  unusual bleeding or bruising Side effects that usually do not require medical attention (report to your doctor  or health care professional if they continue or are bothersome):  change in sex drive or performance  change in appetite or weight  constipation  headache  muscle aches or pains  nausea This list may not describe all possible side effects. Call your doctor for medical advice about side effects. You may report side effects to FDA at 1-800-FDA-1088. Where should I keep my medicine? Keep out of the reach of children. Store at room temperature between 15 and 30 degrees C (59 to 86 degrees F). Protect from light. Keep container tightly closed. Throw away any unused medicine after the expiration date. NOTE: This sheet is a summary. It may not cover all possible information. If you have questions about this medicine, talk to your doctor, pharmacist, or health care provider.  2020 Elsevier/Gold Standard (2018-11-29 11:46:46)  

## 2020-04-02 ENCOUNTER — Encounter: Payer: Self-pay | Admitting: Physician Assistant

## 2020-04-02 DIAGNOSIS — E1140 Type 2 diabetes mellitus with diabetic neuropathy, unspecified: Secondary | ICD-10-CM

## 2020-04-02 MED ORDER — GABAPENTIN 600 MG PO TABS: 600.0000 mg | ORAL_TABLET | Freq: Three times a day (TID) | ORAL | 0 refills | Status: DC

## 2020-04-04 ENCOUNTER — Encounter: Payer: Self-pay | Admitting: Physician Assistant

## 2020-04-04 DIAGNOSIS — M5137 Other intervertebral disc degeneration, lumbosacral region: Secondary | ICD-10-CM

## 2020-04-04 MED ORDER — OXYCODONE-ACETAMINOPHEN 5-325 MG PO TABS: 1.0000 | ORAL_TABLET | Freq: Four times a day (QID) | ORAL | 0 refills | Status: DC | PRN

## 2020-04-05 ENCOUNTER — Encounter: Payer: Self-pay | Admitting: Physician Assistant

## 2020-04-14 ENCOUNTER — Other Ambulatory Visit: Payer: Self-pay | Admitting: Physician Assistant

## 2020-04-14 DIAGNOSIS — E1140 Type 2 diabetes mellitus with diabetic neuropathy, unspecified: Secondary | ICD-10-CM

## 2020-04-14 NOTE — Telephone Encounter (Signed)
Requested Prescriptions  Pending Prescriptions Disp Refills  . gabapentin (NEURONTIN) 600 MG tablet [Pharmacy Med Name: GABAPENTIN 600MG  TABLETS] 270 tablet 3    Sig: TAKE 1 TABLET(600 MG) BY MOUTH THREE TIMES DAILY     Neurology: Anticonvulsants - gabapentin Passed - 04/14/2020  3:21 AM      Passed - Valid encounter within last 12 months    Recent Outpatient Visits          1 week ago [place], [place], [place]   1 month ago Herpes zoster without complication   [place], [place], [place]   4 months ago [place], [place], PA-C   6 months ago Type 2 diabetes mellitus with diabetic neuropathy, without long-term current use of insulin [facility])   [facility], [place], [place]   11 months ago Type 2 diabetes mellitus with diabetic neuropathy, without long-term current use of insulin [facility])   [place], [place], [place]

## 2020-05-01 ENCOUNTER — Encounter: Payer: Self-pay | Admitting: Physician Assistant

## 2020-05-01 DIAGNOSIS — M5137 Other intervertebral disc degeneration, lumbosacral region: Secondary | ICD-10-CM

## 2020-05-02 MED ORDER — OXYCODONE-ACETAMINOPHEN 5-325 MG PO TABS: 1.0000 | ORAL_TABLET | Freq: Four times a day (QID) | ORAL | 0 refills | Status: DC | PRN

## 2020-05-08 ENCOUNTER — Other Ambulatory Visit: Payer: Self-pay | Admitting: Physician Assistant

## 2020-05-08 ENCOUNTER — Encounter: Payer: Self-pay | Admitting: Physician Assistant

## 2020-05-08 DIAGNOSIS — Z1231 Encounter for screening mammogram for malignant neoplasm of breast: Secondary | ICD-10-CM

## 2020-05-09 ENCOUNTER — Ambulatory Visit
Admission: RE | Admit: 2020-05-09 | Discharge: 2020-05-09 | Disposition: A | Discharge status: Home / Self Care | Payer: 59 | Source: Ambulatory Visit | Attending: Physician Assistant | Admitting: Physician Assistant

## 2020-05-09 ENCOUNTER — Other Ambulatory Visit: Payer: Self-pay

## 2020-05-09 DIAGNOSIS — Z1231 Encounter for screening mammogram for malignant neoplasm of breast: Secondary | ICD-10-CM

## 2020-05-14 ENCOUNTER — Encounter: Payer: Self-pay | Admitting: Physician Assistant

## 2020-05-15 ENCOUNTER — Telehealth: Payer: Self-pay

## 2020-05-15 NOTE — Telephone Encounter (Signed)
Spoke with Gabriella Marsh.  Pt states she is feeling better and would like to hold off on making an appointment at this time.  She states she will call if her symptoms worsen.  Thanks,   -Mickel Baas

## 2020-05-15 NOTE — Telephone Encounter (Signed)
Opened in error

## 2020-05-16 ENCOUNTER — Encounter: Payer: Self-pay | Admitting: Physician Assistant

## 2020-05-16 ENCOUNTER — Ambulatory Visit (INDEPENDENT_AMBULATORY_CARE_PROVIDER_SITE_OTHER): Payer: 59 | Admitting: Family Medicine

## 2020-05-16 ENCOUNTER — Other Ambulatory Visit: Payer: Self-pay

## 2020-05-16 ENCOUNTER — Encounter: Payer: Self-pay | Admitting: Family Medicine

## 2020-05-16 VITALS — BP 124/80 | HR 86 | Temp 96.6°F | Wt 216.0 lb

## 2020-05-16 DIAGNOSIS — N3090 Cystitis, unspecified without hematuria: Secondary | ICD-10-CM | POA: Insufficient documentation

## 2020-05-16 LAB — POCT URINALYSIS DIPSTICK
Bilirubin, UA: NEGATIVE
Blood, UA: NEGATIVE
Glucose, UA: POSITIVE — AB
Ketones, UA: NEGATIVE
Nitrite, UA: NEGATIVE
Protein, UA: NEGATIVE
Spec Grav, UA: 1.015 (ref 1.010–1.025)
Urobilinogen, UA: 0.2 E.U./dL
pH, UA: 5 (ref 5.0–8.0)

## 2020-05-16 MED ORDER — SULFAMETHOXAZOLE-TRIMETHOPRIM 800-160 MG PO TABS: 1.0000 | ORAL_TABLET | Freq: Two times a day (BID) | ORAL | 0 refills | Status: DC

## 2020-05-16 NOTE — Patient Instructions (Signed)
Urinary Tract Infection, Adult A urinary tract infection (UTI) is an infection of any part of the urinary tract. The urinary tract includes:  The kidneys.  The ureters.  The bladder.  The urethra. These organs make, store, and get rid of pee (urine) in the body. What are the causes? This is caused by germs (bacteria) in your genital area. These germs grow and cause swelling (inflammation) of your urinary tract. What increases the risk? You are more likely to develop this condition if:  You have a small, thin tube (catheter) to drain pee.  You cannot control when you pee or poop (incontinence).  You are female, and: ? You use these methods to prevent pregnancy:  A medicine that kills sperm (spermicide).  A device that blocks sperm (diaphragm). ? You have low levels of a female hormone (estrogen). ? You are pregnant.  You have genes that add to your risk.  You are sexually active.  You take antibiotic medicines.  You have trouble peeing because of: ? A prostate that is bigger than normal, if you are female. ? A blockage in the part of your body that drains pee from the bladder (urethra). ? A kidney stone. ? A nerve condition that affects your bladder (neurogenic bladder). ? Not getting enough to drink. ? Not peeing often enough.  You have other conditions, such as: ? Diabetes. ? A weak disease-fighting system (immune system). ? Sickle cell disease. ? Gout. ? Injury of the spine. What are the signs or symptoms? Symptoms of this condition include:  Needing to pee right away (urgently).  Peeing often.  Peeing small amounts often.  Pain or burning when peeing.  Blood in the pee.  Pee that smells bad or not like normal.  Trouble peeing.  Pee that is cloudy.  Fluid coming from the vagina, if you are female.  Pain in the belly or lower back. Other symptoms include:  Throwing up (vomiting).  No urge to eat.  Feeling mixed up (confused).  Being tired  and grouchy (irritable).  A fever.  Watery poop (diarrhea). How is this treated? This condition may be treated with:  Antibiotic medicine.  Other medicines.  Drinking enough water. Follow these instructions at home:  Medicines  Take over-the-counter and prescription medicines only as told by your doctor.  If you were prescribed an antibiotic medicine, take it as told by your doctor. Do not stop taking it even if you start to feel better. General instructions  Make sure you: ? Pee until your bladder is empty. ? Do not hold pee for a long time. ? Empty your bladder after sex. ? Wipe from front to back after pooping if you are a female. Use each tissue one time when you wipe.  Drink enough fluid to keep your pee pale yellow.  Keep all follow-up visits as told by your doctor. This is important. Contact a doctor if:  You do not get better after 1-2 days.  Your symptoms go away and then come back. Get help right away if:  You have very bad back pain.  You have very bad pain in your lower belly.  You have a fever.  You are sick to your stomach (nauseous).  You are throwing up. Summary  A urinary tract infection (UTI) is an infection of any part of the urinary tract.  This condition is caused by germs in your genital area.  There are many risk factors for a UTI. These include having a small, thin   tube to drain pee and not being able to control when you pee or poop.  Treatment includes antibiotic medicines for germs.  Drink enough fluid to keep your pee pale yellow. This information is not intended to replace advice given to you by your health care provider. Make sure you discuss any questions you have with your health care provider. Document Revised: 11/24/2018 Document Reviewed: 06/16/2018 Elsevier Patient Education  2020 Elsevier Inc.  

## 2020-05-16 NOTE — Telephone Encounter (Signed)
Apt today at 11am with Dr. Brita Romp.   Thanks,   -Mickel Baas

## 2020-05-16 NOTE — Progress Notes (Signed)
I,Laura E Walsh,acting as a scribe for Lavon Paganini, MD.,have documented all relevant documentation on the behalf of Lavon Paganini, MD,as directed by  Lavon Paganini, MD while in the presence of Lavon Paganini, MD.    Established patient visit   Patient: Gabriella Marsh   DOB: [birth year]   54 y.o. Female  MRN: [id number] Visit Date: 05/16/2020  Today's healthcare provider: Lavon Paganini, MD   Chief Complaint  Patient presents with  . Back Pain  . Urinary Frequency   Subjective    Back Pain This is a new problem. The current episode started in the past 7 days. The problem occurs constantly. The problem has been gradually worsening since onset. The pain is present in the lumbar spine (Right sided back pain). The quality of the pain is described as aching. The pain does not radiate. Pertinent negatives include no abdominal pain, bladder incontinence, bowel incontinence, chest pain, dysuria, headaches, numbness or perianal numbness.  Urinary Frequency  This is a recurrent problem. There has been no fever. Associated symptoms include frequency. Pertinent negatives include no chills, discharge, hematuria, hesitancy, urgency or vomiting.     Patient Active Problem List   Diagnosis Date Noted  . Cystitis 05/16/2020  . Schamberg's purpura 10/09/2019  . Leg swelling 10/09/2019  . Depression, major, single episode, moderate ([place]) 01/23/2019  . Benign positional vertigo 02/03/2016  . Uncontrolled type 2 diabetes mellitus with peripheral neuropathy ([place]) 07/22/2015  . Common migraine with intractable migraine 06/18/2015  . Anxiety 05/04/2015  . Adaptation reaction 05/03/2015  . DDD (degenerative disc disease), lumbosacral 05/03/2015  . Type 2 diabetes mellitus with diabetic neuropathy, without long-term current use of insulin ([place]) 05/03/2015  . Hypercholesteremia 05/03/2015  . Insomnia due to medical condition 05/03/2015  . Adiposity 05/03/2015  . Disorder  of peripheral nervous system 05/03/2015  . Snores 05/03/2015  . Diabetic neuropathy ([place]) 03/06/2015  . Essential hypertension 03/06/2015  . Elevated liver function tests 03/06/2015  . Obesity 03/06/2015  . GERD (gastroesophageal reflux disease) 03/06/2015  . Multinodular goiter 03/06/2015  . RLS (restless legs syndrome) 03/06/2015  . Leg cramps 03/06/2015   Past Medical History:  Diagnosis Date  . Arthritis    knees  . Degenerative disc disease, lumbar   . Diabetes mellitus without complication ([place])   . GERD (gastroesophageal reflux disease)   . Headache    daily - AM (has not been able to have SPG blocks lately)  . Hyperlipidemia   . Hypertension   . Neuropathy    feet  . Vertigo    3-4x/yr   Social History   Tobacco Use  . Smoking status: Former Smoker    Quit date: 2013    Years since quitting: 8.4  . Smokeless tobacco: Never Used  Substance Use Topics  . Alcohol use: No    Alcohol/week: 0.0 standard drinks  . Drug use: Not on file   Allergies  Allergen Reactions  . Celecoxib Shortness Of Breath  . Pregabalin Shortness Of Breath  . Buspar [Buspirone] Other (See Comments)    Tachycardia  . Citalopram Cough     Medications: Outpatient Medications Prior to Visit  Medication Sig  . atorvastatin (LIPITOR) 20 MG tablet Take 1 tablet (20 mg total) by mouth daily.  . cyclobenzaprine (FLEXERIL) 5 MG tablet Take 1 tablet (5 mg total) by mouth 3 (three) times daily as needed for muscle spasms.  . furosemide (LASIX) 20 MG tablet TAKE 2 TABLETS(40 MG) BY MOUTH DAILY AS NEEDED  FOR FLUID RETENTION OR SWELLING  . gabapentin (NEURONTIN) 100 MG capsule TAKE 2 CAPSULES THREE TIMES A DAY ALONG WITH 600 MG THREE TIMES A DAY FOR A TOTAL DAILY DOSE OF 2400 MG DAILY  . gabapentin (NEURONTIN) 600 MG tablet TAKE 1 TABLET(600 MG) BY MOUTH THREE TIMES DAILY  . glipiZIDE (GLUCOTROL) 10 MG tablet TAKE ONE-HALF (1/2) TABLET TWICE A DAY BEFORE MEALS  . glucose blood test strip Check  blood sugars twice daily. OneTouch Verio strips.  . Insulin Pen Needle (CAREFINE PEN NEEDLES) 32G X 4 MM MISC Use 1x a day  . Melatonin 3 MG TABS Take 1 tablet by mouth at bedtime.  . metFORMIN (GLUCOPHAGE-XR) 500 MG 24 hr tablet TAKE 2 TABLETS TWICE A DAY WITH MEALS  . Multiple Vitamins-Minerals (MULTIVITAMIN ADULT PO) Take by mouth.  . nortriptyline (PAMELOR) 25 MG capsule Take 2 capsules (50 mg total) by mouth at bedtime.  Marland Kitchen omeprazole (PRILOSEC) 20 MG capsule Take 1 capsule (20 mg total) by mouth daily.  Marland Kitchen oxyCODONE-acetaminophen (PERCOCET/ROXICET) 5-325 MG tablet Take 1 tablet by mouth every 6 (six) hours as needed for severe pain.  . potassium chloride SA (KLOR-CON) 20 MEQ tablet Take 1 tablet (20 mEq total) by mouth daily. Take any time she takes lasix  . traZODone (DESYREL) 50 MG tablet Take 1-2 tablets (50-100 mg total) by mouth at bedtime as needed for sleep.  Marland Kitchen triamcinolone cream (KENALOG) 0.1 % Apply 1 application topically 2 (two) times daily.  Marland Kitchen triamterene-hydrochlorothiazide (MAXZIDE-25) 37.5-25 MG tablet Take 1 tablet by mouth daily.   No facility-administered medications prior to visit.    Review of Systems  Constitutional: Negative.  Negative for chills.  Cardiovascular: Positive for leg swelling (Right foot swelling). Negative for chest pain and palpitations.  Gastrointestinal: Negative.  Negative for abdominal pain, bowel incontinence and vomiting.  Genitourinary: Positive for frequency. Negative for bladder incontinence, decreased urine volume, difficulty urinating, dyspareunia, dysuria, enuresis, hematuria, hesitancy, urgency, vaginal bleeding, vaginal discharge and vaginal pain.  Musculoskeletal: Positive for back pain.  Neurological: Positive for dizziness. Negative for light-headedness, numbness and headaches.      Objective    BP 124/80 (BP Location: Left Arm, Patient Position: Sitting, Cuff Size: Large)   Pulse 86   Temp (!) 96.6 F (35.9 C) (Temporal)    Wt 216 lb (98 kg)   SpO2 97%   BMI 30.13 kg/m    Physical Exam Vitals reviewed.  Constitutional:      General: She is not in acute distress.    Appearance: Normal appearance. She is not diaphoretic.  HENT:     Head: Normocephalic and atraumatic.  Eyes:     Conjunctiva/sclera: Conjunctivae normal.  Cardiovascular:     Rate and Rhythm: Normal rate and regular rhythm.     Pulses: Normal pulses.     Heart sounds: Normal heart sounds.  Pulmonary:     Effort: Pulmonary effort is normal. No respiratory distress.     Breath sounds: Normal breath sounds. No wheezing or rhonchi.  Abdominal:     General: There is no distension.     Palpations: Abdomen is soft.     Tenderness: There is abdominal tenderness (mild, suprapubic). There is no right CVA tenderness, left CVA tenderness, guarding or rebound.     Comments: Mild TTP over R flank, not CVAT  Musculoskeletal:     Cervical back: Normal.     Thoracic back: Normal.     Lumbar back: Tenderness present.  Skin:  General: Skin is warm.     Findings: No rash.  Neurological:     Mental Status: She is alert and oriented to person, place, and time. Mental status is at baseline.  Psychiatric:        Mood and Affect: Mood normal.        Behavior: Behavior normal.        Thought Content: Thought content normal.       Results for orders placed or performed in visit on 05/16/20  POCT urinalysis dipstick  Result Value Ref Range   Color, UA     Clarity, UA     Glucose, UA Positive (A) Negative   Bilirubin, UA Negative    Ketones, UA Negative    Spec Grav, UA 1.015 1.010 - 1.025   Blood, UA Negative    pH, UA 5.0 5.0 - 8.0   Protein, UA Negative Negative   Urobilinogen, UA 0.2 0.2 or 1.0 E.U./dL   Nitrite, UA Negative    Leukocytes, UA Small (1+) (A) Negative   Appearance     Odor      Assessment & Plan     Problem List Items Addressed This Visit      Genitourinary   Cystitis - Primary    - Symptoms and UA consistent with  UTI -No systemic symptoms or signs of pyelonephritis -Will start treatment with 5 day course of Bactrim after reviewing previous urine culture  -We will send urine culture to confirm sensitivities -Discussed return precautions       Relevant Orders   POCT urinalysis dipstick (Completed)   CULTURE, URINE COMPREHENSIVE    Reviewed previous urine culture   Return if symptoms worsen or fail to improve.      I, Lavon Paganini, MD, have reviewed all documentation for this visit. The documentation on 05/16/20 for the exam, diagnosis, procedures, and orders are all accurate and complete.   Karee Christopherson, Dionne Bucy, MD, MPH [facility]

## 2020-05-16 NOTE — Assessment & Plan Note (Signed)
-   Symptoms and UA consistent with UTI -No systemic symptoms or signs of pyelonephritis -Will start treatment with 5 day course of Bactrim after reviewing previous urine culture  -We will send urine culture to confirm sensitivities -Discussed return precautions

## 2020-05-21 LAB — CULTURE, URINE COMPREHENSIVE

## 2020-05-22 ENCOUNTER — Other Ambulatory Visit: Payer: Self-pay

## 2020-05-22 ENCOUNTER — Telehealth: Payer: Self-pay

## 2020-05-22 ENCOUNTER — Ambulatory Visit (INDEPENDENT_AMBULATORY_CARE_PROVIDER_SITE_OTHER): Payer: 59 | Admitting: Physician Assistant

## 2020-05-22 ENCOUNTER — Encounter: Payer: Self-pay | Admitting: Physician Assistant

## 2020-05-22 VITALS — BP 130/87 | HR 90 | Temp 97.0°F | Resp 16 | Wt 215.8 lb

## 2020-05-22 DIAGNOSIS — M62838 Other muscle spasm: Secondary | ICD-10-CM

## 2020-05-22 DIAGNOSIS — E1142 Type 2 diabetes mellitus with diabetic polyneuropathy: Secondary | ICD-10-CM

## 2020-05-22 DIAGNOSIS — R3989 Other symptoms and signs involving the genitourinary system: Secondary | ICD-10-CM

## 2020-05-22 DIAGNOSIS — R81 Glycosuria: Secondary | ICD-10-CM | POA: Diagnosis not present

## 2020-05-22 DIAGNOSIS — F321 Major depressive disorder, single episode, moderate: Secondary | ICD-10-CM

## 2020-05-22 DIAGNOSIS — IMO0002 Reserved for concepts with insufficient information to code with codable children: Secondary | ICD-10-CM

## 2020-05-22 DIAGNOSIS — E1165 Type 2 diabetes mellitus with hyperglycemia: Secondary | ICD-10-CM

## 2020-05-22 DIAGNOSIS — R0781 Pleurodynia: Secondary | ICD-10-CM

## 2020-05-22 LAB — POCT URINALYSIS DIPSTICK
Bilirubin, UA: NEGATIVE
Blood, UA: NEGATIVE
Glucose, UA: POSITIVE — AB
Ketones, UA: NEGATIVE
Leukocytes, UA: NEGATIVE
Nitrite, UA: NEGATIVE
Protein, UA: NEGATIVE
Spec Grav, UA: 1.02 (ref 1.010–1.025)
Urobilinogen, UA: 0.2 E.U./dL
pH, UA: 6 (ref 5.0–8.0)

## 2020-05-22 LAB — POCT GLYCOSYLATED HEMOGLOBIN (HGB A1C)
Est. average glucose Bld gHb Est-mCnc: 260
Hemoglobin A1C: 10.7 % — AB (ref 4.0–5.6)

## 2020-05-22 MED ORDER — CYCLOBENZAPRINE HCL 5 MG PO TABS: 5.0000 mg | ORAL_TABLET | Freq: Three times a day (TID) | ORAL | 1 refills | Status: DC | PRN

## 2020-05-22 NOTE — Progress Notes (Signed)
Established patient visit   Patient: Gabriella Marsh   DOB: 19-Oct-1966   54 y.o. Female  MRN: [id number] Visit Date: 05/22/2020  Today's healthcare provider: Mar Daring, PA-C   Chief Complaint  Patient presents with  . Mass   Subjective    HPI Patient here with c/o lump under left breast. She already had a mammogram and it was normal. Reports this has been present for 4-5 months. It doesn't hurt. This is same area we have also obtained xrays for that were normal.   She also reports that she still hurts in her back where the kidneys are. She was seen 05/27 and was treated for Cystitis. Reports that the antibiotic helped a little bit.  Patient Active Problem List   Diagnosis Date Noted  . Cystitis 05/16/2020  . Schamberg's purpura 10/09/2019  . Leg swelling 10/09/2019  . Depression, major, single episode, moderate ([place]) 01/23/2019  . Benign positional vertigo 02/03/2016  . Uncontrolled type 2 diabetes mellitus with peripheral neuropathy ([place]) 07/22/2015  . Common migraine with intractable migraine 06/18/2015  . Anxiety 05/04/2015  . Adaptation reaction 05/03/2015  . DDD (degenerative disc disease), lumbosacral 05/03/2015  . Type 2 diabetes mellitus with diabetic neuropathy, without long-term current use of insulin ([place]) 05/03/2015  . Hypercholesteremia 05/03/2015  . Insomnia due to medical condition 05/03/2015  . Adiposity 05/03/2015  . Disorder of peripheral nervous system 05/03/2015  . Snores 05/03/2015  . Diabetic neuropathy ([place]) 03/06/2015  . Essential hypertension 03/06/2015  . Elevated liver function tests 03/06/2015  . Obesity 03/06/2015  . GERD (gastroesophageal reflux disease) 03/06/2015  . Multinodular goiter 03/06/2015  . RLS (restless legs syndrome) 03/06/2015  . Leg cramps 03/06/2015   Past Medical History:  Diagnosis Date  . Arthritis    knees  . Degenerative disc disease, lumbar   . Diabetes mellitus without complication ([place])   .  GERD (gastroesophageal reflux disease)   . Headache    daily - AM (has not been able to have SPG blocks lately)  . Hyperlipidemia   . Hypertension   . Neuropathy    feet  . Vertigo    3-4x/yr       Medications: Outpatient Medications Prior to Visit  Medication Sig  . atorvastatin (LIPITOR) 20 MG tablet Take 1 tablet (20 mg total) by mouth daily.  . furosemide (LASIX) 20 MG tablet TAKE 2 TABLETS(40 MG) BY MOUTH DAILY AS NEEDED FOR FLUID RETENTION OR SWELLING  . gabapentin (NEURONTIN) 100 MG capsule TAKE 2 CAPSULES THREE TIMES A DAY ALONG WITH 600 MG THREE TIMES A DAY FOR A TOTAL DAILY DOSE OF 2400 MG DAILY  . gabapentin (NEURONTIN) 600 MG tablet TAKE 1 TABLET(600 MG) BY MOUTH THREE TIMES DAILY  . glipiZIDE (GLUCOTROL) 10 MG tablet TAKE ONE-HALF (1/2) TABLET TWICE A DAY BEFORE MEALS  . glucose blood test strip Check blood sugars twice daily. OneTouch Verio strips.  . Melatonin 3 MG TABS Take 1 tablet by mouth at bedtime.  . metFORMIN (GLUCOPHAGE-XR) 500 MG 24 hr tablet TAKE 2 TABLETS TWICE A DAY WITH MEALS  . Multiple Vitamins-Minerals (MULTIVITAMIN ADULT PO) Take by mouth.  . nortriptyline (PAMELOR) 25 MG capsule Take 2 capsules (50 mg total) by mouth at bedtime.  Marland Kitchen omeprazole (PRILOSEC) 20 MG capsule Take 1 capsule (20 mg total) by mouth daily.  Marland Kitchen oxyCODONE-acetaminophen (PERCOCET/ROXICET) 5-325 MG tablet Take 1 tablet by mouth every 6 (six) hours as needed for severe pain.  . potassium chloride SA (  KLOR-CON) 20 MEQ tablet Take 1 tablet (20 mEq total) by mouth daily. Take any time she takes lasix  . traZODone (DESYREL) 50 MG tablet Take 1-2 tablets (50-100 mg total) by mouth at bedtime as needed for sleep.  Marland Kitchen triamcinolone cream (KENALOG) 0.1 % Apply 1 application topically 2 (two) times daily.  Marland Kitchen triamterene-hydrochlorothiazide (MAXZIDE-25) 37.5-25 MG tablet Take 1 tablet by mouth daily.  . [DISCONTINUED] cyclobenzaprine (FLEXERIL) 5 MG tablet Take 1 tablet (5 mg total) by mouth 3  (three) times daily as needed for muscle spasms.  . [DISCONTINUED] sulfamethoxazole-trimethoprim (BACTRIM DS) 800-160 MG tablet Take 1 tablet by mouth 2 (two) times daily.  . [DISCONTINUED] Insulin Pen Needle (CAREFINE PEN NEEDLES) 32G X 4 MM MISC Use 1x a day   No facility-administered medications prior to visit.    Review of Systems  Constitutional: Negative.   Respiratory: Negative for cough, chest tightness and shortness of breath.   Cardiovascular: Negative.   Gastrointestinal: Negative.   Genitourinary: Negative for dysuria, flank pain, frequency, pelvic pain and urgency.  Musculoskeletal: Positive for arthralgias, back pain and myalgias.  Neurological: Positive for numbness.    Last CBC Lab Results  Component Value Date   WBC 9.9 01/23/2019   HGB 13.5 01/23/2019   HCT 38.2 01/23/2019   MCV 81 01/23/2019   MCH 28.8 01/23/2019   RDW 12.9 01/23/2019   PLT 348 123456   Last metabolic panel Lab Results  Component Value Date   GLUCOSE 286 (H) 10/06/2019   NA 136 10/06/2019   K 4.3 10/06/2019   CL 93 (L) 10/06/2019   CO2 25 10/06/2019   BUN 14 10/06/2019   CREATININE 0.64 10/06/2019   GFRNONAA 103 10/06/2019   GFRAA 119 10/06/2019   CALCIUM 9.5 10/06/2019   PROT 7.1 01/23/2019   ALBUMIN 4.3 01/23/2019   LABGLOB 2.8 01/23/2019   AGRATIO 1.5 01/23/2019   BILITOT 0.7 01/23/2019   ALKPHOS 91 01/23/2019   AST 28 01/23/2019   ALT 38 (H) 01/23/2019   ANIONGAP 9 06/28/2015   Last lipids Lab Results  Component Value Date   CHOL 217 (H) 01/23/2019   HDL 37 (L) 01/23/2019   LDLCALC Comment 01/23/2019   TRIG 614 (HH) 01/23/2019   CHOLHDL 5.9 (H) 01/23/2019   Last hemoglobin A1c Lab Results  Component Value Date   HGBA1C 10.7 (A) 05/22/2020      Objective    BP 130/87 (BP Location: Left Arm, Patient Position: Sitting, Cuff Size: Large)   Pulse 90   Temp (!) 97 F (36.1 C) (Temporal)   Resp 16   Wt 215 lb 12.8 oz (97.9 kg)   BMI 30.10 kg/m  BP  Readings from Last 3 Encounters:  05/22/20 130/87  05/16/20 124/80  04/01/20 107/71   Wt Readings from Last 3 Encounters:  05/22/20 215 lb 12.8 oz (97.9 kg)  05/16/20 216 lb (98 kg)  04/01/20 216 lb 9.6 oz (98.2 kg)      Physical Exam Vitals reviewed.  Constitutional:      General: She is not in acute distress.    Appearance: Normal appearance. She is well-developed. She is obese. She is not ill-appearing or diaphoretic.  Cardiovascular:     Rate and Rhythm: Normal rate and regular rhythm.     Pulses: Normal pulses.     Heart sounds: Normal heart sounds. No murmur. No friction rub. No gallop.   Pulmonary:     Effort: Pulmonary effort is normal. No respiratory distress.  Breath sounds: Normal breath sounds. No wheezing or rales.  Musculoskeletal:     Cervical back: Normal range of motion and neck supple.     Right lower leg: No edema.     Left lower leg: No edema.  Neurological:     Mental Status: She is alert.       Results for orders placed or performed in visit on 05/22/20  POCT urinalysis dipstick  Result Value Ref Range   Color, UA Yellow    Clarity, UA cloudy    Glucose, UA Positive (A) Negative   Bilirubin, UA Negative    Ketones, UA Negative    Spec Grav, UA 1.020 1.010 - 1.025   Blood, UA Negative    pH, UA 6.0 5.0 - 8.0   Protein, UA Negative Negative   Urobilinogen, UA 0.2 0.2 or 1.0 E.U./dL   Nitrite, UA Negative    Leukocytes, UA Negative Negative   Appearance     Odor    POCT HgB A1C  Result Value Ref Range   Hemoglobin A1C 10.7 (A) 4.0 - 5.6 %   Est. average glucose Bld gHb Est-mCnc 260     Assessment & Plan     1. Possible urinary tract infection UA today only positive for glucose.   2. Muscle spasm Stable. Diagnosis pulled for medication refill. Continue current medical treatment plan. - cyclobenzaprine (FLEXERIL) 5 MG tablet; Take 1 tablet (5 mg total) by mouth 3 (three) times daily as needed for muscle spasms.  Dispense: 270 tablet;  Refill: 1  3. Glucosuria UA positive for glucose in a diabetic patient. Not on any medications that would cause this. A1c checked today and up from 7.9 to now 10.7. Patient has been through death of her mother and reports she was not eating right. Continue diabetic medications as prescribed. Work on lifestyle modifications. F/U in 3 months for T2DM.  - Urine Microalbumin w/creat. ratio  4. Uncontrolled type 2 diabetes mellitus with peripheral neuropathy ([place]) See above medical treatment plan. - Urine Microalbumin w/creat. ratio  5. Depression, major, single episode, moderate ([facility]) Improving. Recently had her mother pass, but doing well overall.   6. Rib pain on left side Most likely has wither inflammation around the costochondral joint around the 6th rib or possibly has the cartilage and rib being slightly out of alignment. Does not cause pain for most part, but occasionally will have irritation there. Discussed seeing a chiropractor or sports medicine specialist, like Hulan Saas, DO with [facility] [place]. She will most likely see Dr. Tamala Julian as he is close to her work. She will call for her own appt when she has more PTO time.   Return in about 3 months (around 08/22/2020) for t2dm.      Reynolds Bowl, PA-C, have reviewed all documentation for this visit. The documentation on 05/22/20 for the exam, diagnosis, procedures, and orders are all accurate and complete.   Rubye Beach  [facility] [phone] (phone) [phone] (fax)  [place]

## 2020-05-22 NOTE — Telephone Encounter (Signed)
-----   Message from Virginia Crews, MD sent at 05/22/2020  8:21 AM EDT ----- Urine culture returned with bacteria species that is normally present in urinary and genital tracts and not typically harmful.  Hope she is feeling better.

## 2020-05-22 NOTE — Telephone Encounter (Signed)
Comment seen by patient Gabriella Marsh on 05/22/2020 8:41 AM EDT

## 2020-05-23 ENCOUNTER — Telehealth: Payer: Self-pay

## 2020-05-23 ENCOUNTER — Encounter: Payer: Self-pay | Admitting: Physician Assistant

## 2020-05-23 DIAGNOSIS — E1129 Type 2 diabetes mellitus with other diabetic kidney complication: Secondary | ICD-10-CM | POA: Insufficient documentation

## 2020-05-23 DIAGNOSIS — R809 Proteinuria, unspecified: Secondary | ICD-10-CM

## 2020-05-23 LAB — MICROALBUMIN / CREATININE URINE RATIO
Creatinine, Urine: 77.2 mg/dL
Microalb/Creat Ratio: 37 mg/g creat — ABNORMAL HIGH (ref 0–29)
Microalbumin, Urine: 28.4 ug/mL

## 2020-05-23 MED ORDER — LISINOPRIL 5 MG PO TABS: 5.0000 mg | ORAL_TABLET | Freq: Every day | ORAL | 3 refills | Status: DC

## 2020-05-23 NOTE — Addendum Note (Signed)
Addended by: Mar Daring on: 05/23/2020 02:34 PM   Modules accepted: Orders

## 2020-05-23 NOTE — Telephone Encounter (Signed)
-----   Message from Mar Daring, [place] sent at 05/23/2020  1:08 PM EDT ----- Urine microalbumin, which looks for early signs of kidney damage from diabetes is positive. Would recommend to start a very low dose blood pressure medication to help protect the kidneys if agreeable.

## 2020-05-23 NOTE — Telephone Encounter (Signed)
Comment seen by patient Gabriella Marsh on 05/23/2020 1:15 PM EDT

## 2020-05-24 ENCOUNTER — Encounter: Payer: Self-pay | Admitting: Physician Assistant

## 2020-05-29 ENCOUNTER — Encounter: Payer: Self-pay | Admitting: Physician Assistant

## 2020-06-03 ENCOUNTER — Encounter: Payer: Self-pay | Admitting: Physician Assistant

## 2020-06-03 DIAGNOSIS — M5137 Other intervertebral disc degeneration, lumbosacral region: Secondary | ICD-10-CM

## 2020-06-03 MED ORDER — OXYCODONE-ACETAMINOPHEN 5-325 MG PO TABS: 1.0000 | ORAL_TABLET | Freq: Four times a day (QID) | ORAL | 0 refills | Status: DC | PRN

## 2020-06-27 ENCOUNTER — Encounter: Payer: Self-pay | Admitting: Physician Assistant

## 2020-06-27 DIAGNOSIS — R3989 Other symptoms and signs involving the genitourinary system: Secondary | ICD-10-CM

## 2020-06-28 MED ORDER — SULFAMETHOXAZOLE-TRIMETHOPRIM 800-160 MG PO TABS: 1.0000 | ORAL_TABLET | Freq: Two times a day (BID) | ORAL | 0 refills | Status: DC

## 2020-06-28 NOTE — Addendum Note (Signed)
Addended by: Mar Daring on: 06/28/2020 04:46 PM   Modules accepted: Orders

## 2020-07-02 ENCOUNTER — Encounter: Payer: Self-pay | Admitting: Physician Assistant

## 2020-07-04 ENCOUNTER — Encounter: Payer: Self-pay | Admitting: Physician Assistant

## 2020-07-04 DIAGNOSIS — M5137 Other intervertebral disc degeneration, lumbosacral region: Secondary | ICD-10-CM

## 2020-07-04 MED ORDER — OXYCODONE-ACETAMINOPHEN 5-325 MG PO TABS: 1.0000 | ORAL_TABLET | Freq: Four times a day (QID) | ORAL | 0 refills | Status: DC | PRN

## 2020-08-01 ENCOUNTER — Encounter: Payer: Self-pay | Admitting: Physician Assistant

## 2020-08-01 DIAGNOSIS — M5137 Other intervertebral disc degeneration, lumbosacral region: Secondary | ICD-10-CM

## 2020-08-01 MED ORDER — OXYCODONE-ACETAMINOPHEN 5-325 MG PO TABS: 1.0000 | ORAL_TABLET | Freq: Four times a day (QID) | ORAL | 0 refills | Status: DC | PRN

## 2020-08-03 ENCOUNTER — Encounter: Payer: Self-pay | Admitting: Physician Assistant

## 2020-08-27 ENCOUNTER — Ambulatory Visit: Payer: 59 | Admitting: Physician Assistant

## 2020-08-29 ENCOUNTER — Encounter: Payer: Self-pay | Admitting: Physician Assistant

## 2020-08-29 DIAGNOSIS — M5137 Other intervertebral disc degeneration, lumbosacral region: Secondary | ICD-10-CM

## 2020-08-29 MED ORDER — OXYCODONE-ACETAMINOPHEN 5-325 MG PO TABS: 1.0000 | ORAL_TABLET | Freq: Four times a day (QID) | ORAL | 0 refills | Status: DC | PRN

## 2020-09-03 ENCOUNTER — Emergency Department
Admission: EM | Admit: 2020-09-03 | Discharge: 2020-09-03 | Disposition: A | Discharge status: Home / Self Care | Payer: 59 | Attending: Emergency Medicine | Admitting: Emergency Medicine

## 2020-09-03 ENCOUNTER — Encounter: Payer: Self-pay | Admitting: *Deleted

## 2020-09-03 ENCOUNTER — Emergency Department: Payer: 59

## 2020-09-03 ENCOUNTER — Other Ambulatory Visit: Payer: Self-pay

## 2020-09-03 ENCOUNTER — Encounter: Payer: Self-pay | Admitting: Physician Assistant

## 2020-09-03 ENCOUNTER — Ambulatory Visit: Payer: Self-pay | Admitting: Physician Assistant

## 2020-09-03 DIAGNOSIS — Z7984 Long term (current) use of oral hypoglycemic drugs: Secondary | ICD-10-CM | POA: Insufficient documentation

## 2020-09-03 DIAGNOSIS — I10 Essential (primary) hypertension: Secondary | ICD-10-CM | POA: Diagnosis not present

## 2020-09-03 DIAGNOSIS — I75022 Atheroembolism of left lower extremity: Secondary | ICD-10-CM | POA: Diagnosis not present

## 2020-09-03 DIAGNOSIS — Z87891 Personal history of nicotine dependence: Secondary | ICD-10-CM | POA: Diagnosis not present

## 2020-09-03 DIAGNOSIS — Z79899 Other long term (current) drug therapy: Secondary | ICD-10-CM | POA: Insufficient documentation

## 2020-09-03 DIAGNOSIS — E1140 Type 2 diabetes mellitus with diabetic neuropathy, unspecified: Secondary | ICD-10-CM | POA: Insufficient documentation

## 2020-09-03 LAB — CBC WITH DIFFERENTIAL/PLATELET
Abs Immature Granulocytes: 0.06 10*3/uL (ref 0.00–0.07)
Basophils Absolute: 0.1 10*3/uL (ref 0.0–0.1)
Basophils Relative: 1 %
Eosinophils Absolute: 0.2 10*3/uL (ref 0.0–0.5)
Eosinophils Relative: 2 %
HCT: 41.5 % (ref 36.0–46.0)
Hemoglobin: 13.9 g/dL (ref 12.0–15.0)
Immature Granulocytes: 1 %
Lymphocytes Relative: 30 %
Lymphs Abs: 3.2 10*3/uL (ref 0.7–4.0)
MCH: 28.5 pg (ref 26.0–34.0)
MCHC: 33.5 g/dL (ref 30.0–36.0)
MCV: 85.2 fL (ref 80.0–100.0)
Monocytes Absolute: 0.7 10*3/uL (ref 0.1–1.0)
Monocytes Relative: 7 %
Neutro Abs: 6.4 10*3/uL (ref 1.7–7.7)
Neutrophils Relative %: 59 %
Platelets: 339 10*3/uL (ref 150–400)
RBC: 4.87 MIL/uL (ref 3.87–5.11)
RDW: 12.7 % (ref 11.5–15.5)
WBC: 10.5 10*3/uL (ref 4.0–10.5)
nRBC: 0 % (ref 0.0–0.2)

## 2020-09-03 LAB — COMPREHENSIVE METABOLIC PANEL
ALT: 39 U/L (ref 0–44)
AST: 33 U/L (ref 15–41)
Albumin: 4 g/dL (ref 3.5–5.0)
Alkaline Phosphatase: 79 U/L (ref 38–126)
Anion gap: 10 (ref 5–15)
BUN: 11 mg/dL (ref 6–20)
CO2: 30 mmol/L (ref 22–32)
Calcium: 9.3 mg/dL (ref 8.9–10.3)
Chloride: 94 mmol/L — ABNORMAL LOW (ref 98–111)
Creatinine, Ser: 0.56 mg/dL (ref 0.44–1.00)
GFR calc Af Amer: >60 mL/min (ref >60)
GFR calc non Af Amer: >60 mL/min (ref >60)
Glucose, Bld: 344 mg/dL — ABNORMAL HIGH (ref 70–99)
Potassium: 5 mmol/L (ref 3.5–5.1)
Sodium: 134 mmol/L — ABNORMAL LOW (ref 135–145)
Total Bilirubin: 1.6 mg/dL — ABNORMAL HIGH (ref 0.3–1.2)
Total Protein: 7.8 g/dL (ref 6.5–8.1)

## 2020-09-03 LAB — LACTIC ACID, PLASMA
Lactic Acid, Venous: 2.5 mmol/L (ref 0.5–1.9)
Lactic Acid, Venous: 2 mmol/L (ref 0.5–1.9)

## 2020-09-03 LAB — SEDIMENTATION RATE: Sed Rate: 30 mm/hr (ref 0–30)

## 2020-09-03 MED ORDER — CEPHALEXIN 500 MG PO CAPS: 500.0000 mg | ORAL_CAPSULE | Freq: Three times a day (TID) | ORAL | 0 refills | Status: AC

## 2020-09-03 NOTE — Telephone Encounter (Signed)
Needs to go to ER. She could have critical blockage of circulation to feet.

## 2020-09-03 NOTE — Telephone Encounter (Signed)
Patient advised and agrees to go to the ER for evaluation.

## 2020-09-03 NOTE — ED Notes (Signed)
Sent rainbow with gray on ice to lab.

## 2020-09-03 NOTE — ED Provider Notes (Signed)
[facility] Emergency Department Provider Note   ____________________________________________   First MD Initiated Contact with Patient 09/03/20 1830     (approximate)  I have reviewed the triage vital signs and the nursing notes.   HISTORY  Chief Complaint toe infection   HPI Gabriella Marsh is a 54 y.o. female who comes in reporting that her left fourth toe is purple.  She has diabetic neuropathy and cannot feel anything but pressure.  She does not know if she bumped it or not.  It is not painful because she cannot feel pain.  She just noticed it today when she got out of the shower she says.  She is not running a fever and has no other complaints.      Past Medical History:  Diagnosis Date  . Arthritis    knees  . Degenerative disc disease, lumbar   . Diabetes mellitus without complication ([place])   . GERD (gastroesophageal reflux disease)   . Headache    daily - AM (has not been able to have SPG blocks lately)  . Hyperlipidemia   . Hypertension   . Neuropathy    feet  . Vertigo    3-4x/yr    Patient Active Problem List   Diagnosis Date Noted  . Type 2 diabetes mellitus with microalbuminuria, without long-term current use of insulin ([place]) 05/23/2020  . Cystitis 05/16/2020  . Schamberg's purpura 10/09/2019  . Leg swelling 10/09/2019  . Depression, major, single episode, moderate ([place]) 01/23/2019  . Benign positional vertigo 02/03/2016  . Uncontrolled type 2 diabetes mellitus with peripheral neuropathy ([place]) 07/22/2015  . Common migraine with intractable migraine 06/18/2015  . Anxiety 05/04/2015  . Adaptation reaction 05/03/2015  . DDD (degenerative disc disease), lumbosacral 05/03/2015  . Type 2 diabetes mellitus with diabetic neuropathy, without long-term current use of insulin ([place]) 05/03/2015  . Hypercholesteremia 05/03/2015  . Insomnia due to medical condition 05/03/2015  . Adiposity 05/03/2015  . Disorder of peripheral  nervous system 05/03/2015  . Snores 05/03/2015  . Diabetic neuropathy ([place]) 03/06/2015  . Essential hypertension 03/06/2015  . Elevated liver function tests 03/06/2015  . Obesity 03/06/2015  . GERD (gastroesophageal reflux disease) 03/06/2015  . Multinodular goiter 03/06/2015  . RLS (restless legs syndrome) 03/06/2015  . Leg cramps 03/06/2015    Past Surgical History:  Procedure Laterality Date  . ABDOMINAL HYSTERECTOMY     But still has cervix  . CESAREAN SECTION    . CHOLECYSTECTOMY    . OVARIAN CYST SURGERY    . SHOULDER SURGERY Left 12/26/014   Dr. Little Ishikawa, [facility]  . TONSILLECTOMY AND ADENOIDECTOMY    . UTERINE FIBROID SURGERY      Prior to Admission medications   Medication Sig Start Date End Date Taking? Authorizing Provider  atorvastatin (LIPITOR) 20 MG tablet Take 1 tablet (20 mg total) by mouth daily. 04/01/20  Yes Mar Daring, PA-C  cyclobenzaprine (FLEXERIL) 5 MG tablet Take 1 tablet (5 mg total) by mouth 3 (three) times daily as needed for muscle spasms. 05/22/20  Yes Mar Daring, PA-C  furosemide (LASIX) 20 MG tablet TAKE 2 TABLETS(40 MG) BY MOUTH DAILY AS NEEDED FOR FLUID RETENTION OR SWELLING 04/01/20  Yes Burnette, Anderson [place] M, PA-C  gabapentin (NEURONTIN) 100 MG capsule TAKE 2 CAPSULES THREE TIMES A DAY ALONG WITH 600 MG THREE TIMES A DAY FOR A TOTAL DAILY DOSE OF 2400 MG DAILY 04/01/20  Yes [name] M, PA-C  gabapentin (NEURONTIN) 600 MG tablet  TAKE 1 TABLET(600 MG) BY MOUTH THREE TIMES DAILY 04/14/20  Yes [name] M, PA-C  glipiZIDE (GLUCOTROL) 10 MG tablet TAKE ONE-HALF (1/2) TABLET TWICE A DAY BEFORE MEALS 04/01/20  Yes [name] M, PA-C  lisinopril (ZESTRIL) 5 MG tablet Take 1 tablet (5 mg total) by mouth daily. 05/23/20  Yes Mar Daring, PA-C  Melatonin 3 MG TABS Take 1 tablet by mouth at bedtime.   Yes [provider]  metFORMIN (GLUMETZA) 1000 MG (MOD) 24 hr tablet Take 1,000 mg by mouth 2 (two) times  daily with a meal.   Yes [provider]  Multiple Vitamins-Minerals (MULTIVITAMIN ADULT PO) Take by mouth.   Yes [provider]  nortriptyline (PAMELOR) 25 MG capsule Take 2 capsules (50 mg total) by mouth at bedtime. 04/01/20  Yes Mar Daring, PA-C  omeprazole (PRILOSEC) 20 MG capsule Take 1 capsule (20 mg total) by mouth daily. 04/01/20  Yes Mar Daring, PA-C  oxyCODONE-acetaminophen (PERCOCET/ROXICET) 5-325 MG tablet Take 1 tablet by mouth every 6 (six) hours as needed for severe pain. 08/29/20  Yes Mar Daring, PA-C  potassium chloride SA (KLOR-CON) 20 MEQ tablet Take 1 tablet (20 mEq total) by mouth daily. Take any time she takes lasix 04/01/20  Yes Burnette, Clearnce Sorrel, PA-C  traZODone (DESYREL) 50 MG tablet Take 1-2 tablets (50-100 mg total) by mouth at bedtime as needed for sleep. 04/01/20  Yes Mar Daring, PA-C  triamterene-hydrochlorothiazide (MAXZIDE-25) 37.5-25 MG tablet Take 1 tablet by mouth daily.   Yes [provider]  cephALEXin (KEFLEX) 500 MG capsule Take 1 capsule (500 mg total) by mouth 3 (three) times daily for 10 days. 09/03/20 09/13/20  Nena Polio, MD    Allergies Celecoxib, Pregabalin, Buspar [buspirone], and Citalopram  Family History  Problem Relation Age of Onset  . Diabetes Father   . Heart disease Father   . Hypertension Father   . Hyperlipidemia Father   . Congestive Heart Failure Father   . Healthy Brother   . Osteoporosis Mother   . Irritable bowel syndrome Mother   . Osteoporosis Maternal Grandmother     Social History Social History   Tobacco Use  . Smoking status: Former Smoker    Quit date: 2013    Years since quitting: 8.7  . Smokeless tobacco: Never Used  Vaping Use  . Vaping Use: Never used  Substance Use Topics  . Alcohol use: No    Alcohol/week: 0.0 standard drinks  . Drug use: Not on file    Review of Systems  Constitutional: No fever/chills Eyes: No visual  changes. ENT: No sore throat. Cardiovascular: Denies chest pain. Respiratory: Denies shortness of breath. Gastrointestinal: No abdominal pain.  No nausea, no vomiting.  No diarrhea.  No constipation. Genitourinary: Negative for dysuria. Musculoskeletal: Negative for back pain. Skin: Negative for rash. Neurological: Negative for headaches, focal weakness  ____________________________________________   PHYSICAL EXAM:  VITAL SIGNS: ED Triage Vitals  Enc Vitals Group     BP 09/03/20 1707 (!) 134/96     Pulse Rate 09/03/20 1707 96     Resp 09/03/20 1707 20     Temp 09/03/20 1707 97.7 F (36.5 C)     Temp src --      SpO2 09/03/20 1707 98 %     Weight 09/03/20 1707 225 lb (102.1 kg)     Height 09/03/20 1707 5\' 10"  (1.778 m)     Head Circumference --  Peak Flow --      Pain Score 09/03/20 1722 0     Pain Loc --      Pain Edu? --      Excl. in [place]? --     Constitutional: Alert and oriented. Well appearing and in no acute distress. Eyes: Conjunctivae are normal. PER Head: Atraumatic. Nose: No congestion/rhinnorhea. Mouth/Throat: Mucous membranes are moist.   Neck: No stridor.  Cardiovascular: Normal rate, regular rhythm.  Leg capillary refill in the toes.  The fourth toe on the left foot that is purple really does not blanch very much at all with pressure. Respiratory: Normal respiratory effort.  No retractions.  Gastrointestinal: Soft and nontender. No distention. No abdominal bruits. No CVA tenderness. Musculoskeletal: No lower extremity tenderness nor edema.  Neurologic:  Normal speech and language. No gross focal neurologic deficits are appreciated Skin:  Skin is warm, dry and intact. No rash noted.  ____________________________________________   LABS (all labs ordered are listed, but only abnormal results are displayed)  Labs Reviewed  COMPREHENSIVE METABOLIC PANEL - Abnormal; Notable for the following components:      Result Value   Sodium 134 (*)    Chloride  94 (*)    Glucose, Bld 344 (*)    Total Bilirubin 1.6 (*)    All other components within normal limits  LACTIC ACID, PLASMA - Abnormal; Notable for the following components:   Lactic Acid, Venous 2.5 (*)    All other components within normal limits  LACTIC ACID, PLASMA - Abnormal; Notable for the following components:   Lactic Acid, Venous 2.0 (*)    All other components within normal limits  CBC WITH DIFFERENTIAL/PLATELET  SEDIMENTATION RATE   ____________________________________________  EKG   ____________________________________________  RADIOLOGY  ED MD interpretation: X-ray read by radiology reviewed by me does not show any osteomyelitis  Official radiology report(s): DG Foot Complete Left  Result Date: 09/03/2020 CLINICAL DATA:  Erythema of the left fourth toe with swelling. Diabetes. EXAM: LEFT FOOT - COMPLETE 3+ VIEW COMPARISON:  11/11/2016 FINDINGS: Diffuse mild bony demineralization. No bony destructive findings characteristic of osteomyelitis. Normal Lisfranc joint alignment. No appreciable fracture. Soft tissue swelling along the dorsum of the foot distally. This is increased compared to the 11/11/2016 exam. Small plantar and Achilles calcaneal spurs. No obvious gas tracking within the soft tissues. IMPRESSION: 1. Increased soft tissue swelling along the dorsum of the foot distally. No underlying bony destructive findings characteristic of osteomyelitis. 2. Diffuse bony demineralization. 3. Small plantar and Achilles calcaneal spurs. Electronically Signed   By: Van Clines M.D.   On: 09/03/2020 19:07    ____________________________________________   PROCEDURES  Procedure(s) performed (including Critical Care):  Procedures   ____________________________________________   INITIAL IMPRESSION / ASSESSMENT AND PLAN / ED COURSE  Patient does not remember injuring the toe but she says she could have since she does not feel her toes.  She could have bruised her  toe or there could be an embolus to the toe or it could be infected.  I will give her some Keflex antibiotic just in case there is an infection but I do not believe there is.  Dr. Delana Meyer the vascular surgeon will check on her in 2 days.  She will return here if anything gets worse at all.             ____________________________________________   FINAL CLINICAL IMPRESSION(S) / ED DIAGNOSES  Final diagnoses:  Purple toe syndrome of left foot ([place])  Actual diagnosis is discolored fourth toe, possible purple toe syndrome ED Discharge Orders         Ordered    cephALEXin (KEFLEX) 500 MG capsule  3 times daily        09/03/20 2050          *Please note:  Lumina Gitto was evaluated in Emergency Department on 09/03/2020 for the symptoms described in the history of present illness. She was evaluated in the context of the global COVID-19 pandemic, which necessitated consideration that the patient might be at risk for infection with the SARS-CoV-2 virus that causes COVID-19. Institutional protocols and algorithms that pertain to the evaluation of patients at risk for COVID-19 are in a state of rapid change based on information released by regulatory bodies including the CDC and federal and state organizations. These policies and algorithms were followed during the patient's care in the ED.  Some ED evaluations and interventions may be delayed as a result of limited staffing during and the pandemic.*   Note:  This document was prepared using Dragon voice recognition software and may include unintentional dictation errors.    Nena Polio, MD 09/03/20 [phone]

## 2020-09-03 NOTE — ED Notes (Signed)
Pharmacy tech at bedside 

## 2020-09-03 NOTE — ED Triage Notes (Signed)
Pt ambulatory to triage.  Pt has left 4th toe redness and swelling for 2 days.  Pt is diabetic.  No known injury to foot/toe.  Pt alert  Speech clear.

## 2020-09-03 NOTE — Discharge Instructions (Addendum)
I am not sure why your toe looks purple..  The blood work really did not show anything that was significant.  The x-rays were okay.  I want you to check your toe at least twice a day.  Return if anything gets any worse at all.  If the toe swells more or the redness gets onto the foot itself or you experience any pain or notice any pus or odor or anything please return immediately.  Dr. Delana Meyer the vascular surgeon can see you on Thursday in the office.  I have given you his contact information.  He should call you to set up the appointment if he does not by tomorrow afternoon I would call the office myself and left his office staff know that you were seen in the ER and he was planning on seeing you on Thursday in the office.  I will give you some antibiotic, some Keflex 1 pill 3 times a day just in case there is an infection.

## 2020-09-03 NOTE — Telephone Encounter (Signed)
Pt states 4th toe left foot "Turning black." States noted today. NO open areas except "Peeling a little between 3rd and 4th toe." Pt h/o neuropathy, "CAn't feel pain and my feet are always cold." Denies fever.  Pt sounds anxious "I'm a diabetic and this is how my husband died."  States sent picture in 28. Pt called during lunch break. Assured pt NT would route to practice for PCPs review and alert of photo in [place]. PLease advise: CB# [phone]  Reason for Disposition . [1] Red area or streak AND [2] no fever    Discolored area of toe, pt diabetic  Answer Assessment - Initial Assessment Questions 1. LOCATION: "Where is the wound located?"      4th toe left foot 2. WOUND APPEARANCE: "What does the wound look like?"     "Toe is turning black" 3. SIZE: If redness is present, ask: "What is the size of the red area?" (Inches, centimeters, or compare to size of a coin)      *No Answer* 4. SPREAD: "What's changed in the last day?"  "Do you see any red streaks coming from the wound?"     *No Answer* 5. ONSET: "When did it start to look infected?"      Noted today 6. MECHANISM: "How did the wound start, what was the cause?"     "I'm a diabetic" 7. PAIN: "Is there any pain?" If Yes, ask: "How bad is the pain?"   (Scale 1-10; or mild, moderate, severe)     No, neuropathy 8. FEVER: "Do you have a fever?" If Yes, ask: "What is your temperature, how was it measured, and when did it start?"     no 9. OTHER SYMPTOMS: "Do you have any other symptoms?" (e.g., shaking chills, weakness, rash elsewhere on body)     no  Protocols used: WOUND INFECTION-A-AH

## 2020-09-05 ENCOUNTER — Ambulatory Visit (INDEPENDENT_AMBULATORY_CARE_PROVIDER_SITE_OTHER): Payer: 59 | Admitting: Nurse Practitioner

## 2020-09-05 ENCOUNTER — Ambulatory Visit (INDEPENDENT_AMBULATORY_CARE_PROVIDER_SITE_OTHER): Payer: 59

## 2020-09-05 ENCOUNTER — Other Ambulatory Visit: Payer: Self-pay

## 2020-09-05 ENCOUNTER — Other Ambulatory Visit (INDEPENDENT_AMBULATORY_CARE_PROVIDER_SITE_OTHER): Payer: Self-pay | Admitting: Vascular Surgery

## 2020-09-05 ENCOUNTER — Encounter (INDEPENDENT_AMBULATORY_CARE_PROVIDER_SITE_OTHER): Payer: Self-pay | Admitting: Vascular Surgery

## 2020-09-05 VITALS — BP 123/72 | HR 108 | Resp 18 | Ht 70.0 in | Wt 217.0 lb

## 2020-09-05 DIAGNOSIS — I10 Essential (primary) hypertension: Secondary | ICD-10-CM

## 2020-09-05 DIAGNOSIS — L819 Disorder of pigmentation, unspecified: Secondary | ICD-10-CM

## 2020-09-05 DIAGNOSIS — I75022 Atheroembolism of left lower extremity: Secondary | ICD-10-CM | POA: Diagnosis not present

## 2020-09-05 DIAGNOSIS — E1142 Type 2 diabetes mellitus with diabetic polyneuropathy: Secondary | ICD-10-CM

## 2020-09-05 MED ORDER — CLOPIDOGREL BISULFATE 75 MG PO TABS: 75.0000 mg | ORAL_TABLET | Freq: Every day | ORAL | 6 refills | Status: DC

## 2020-09-05 MED ORDER — MUPIROCIN 2 % EX OINT: 1.0000 "application " | TOPICAL_OINTMENT | Freq: Every day | CUTANEOUS | 0 refills | Status: DC

## 2020-09-10 ENCOUNTER — Encounter (INDEPENDENT_AMBULATORY_CARE_PROVIDER_SITE_OTHER): Payer: Self-pay | Admitting: Nurse Practitioner

## 2020-09-10 NOTE — Progress Notes (Signed)
Subjective:    Patient ID: Gabriella Marsh, female    DOB: Aug 18, 1966, 54 y.o.   MRN: [id number] Chief Complaint  Patient presents with  . New Patient (Initial Visit)    ulcer left 4th toe    The patient is seen for evaluation of abrupt onset of blue discoloration of the left fourth toe toe.  The patient notes the change has been present for a couple weeks and has been improving.  It is not very painful however the patient has severe neuropathy in her bilateral feet and there is a small superficial wound in her medial surface of the toe.  No specific history of trauma noted by the patient, however she is unsure..  The patient denies fever or chills.  the patient does have diabetes which has been difficult to control.  Patient notes prior to the discoloration developing the extremities were not painful with ambulation or activity.    The patient denies rest pain or dangling of an extremity off the side of the bed during the night for relief. No prior interventions or surgeries.  No history of back problems or DJD of the lumbar sacral spine.   The patient denies amaurosis fugax or recent TIA symptoms. There are no recent neurological changes noted. The patient denies history of DVT, PE or superficial thrombophlebitis. The patient denies recent episodes of angina or shortness of breath.   She denies any cardiac arrhythmias.  Today noninvasive studies show a ABI of 1.12 on the right and 1.13 on the left.  She has triphasic tibial artery waveforms with normal toe waveforms bilaterally.  A fourth digit waveforms were also obtained on her bilateral feet and these were also normal bilaterally.   Review of Systems  Skin: Positive for color change.  Neurological: Positive for numbness.  Hematological: Bruises/bleeds easily.  All other systems reviewed and are negative.      Objective:   Physical Exam Vitals reviewed.  HENT:     Head: Normocephalic.  Cardiovascular:     Rate and  Rhythm: Normal rate and regular rhythm.  Musculoskeletal:       Feet:  Skin:    General: Skin is cool and dry.  Neurological:     Mental Status: She is alert and oriented to person, place, and time.  Psychiatric:        Mood and Affect: Mood normal.        Behavior: Behavior normal.        Thought Content: Thought content normal.        Judgment: Judgment normal.     BP 123/72 (BP Location: Right Arm)   Pulse (!) 108   Resp 18   Ht 5\' 10"  (1.778 m)   Wt 217 lb (98.4 kg)   BMI 31.14 kg/m   Past Medical History:  Diagnosis Date  . Arthritis    knees  . Degenerative disc disease, lumbar   . Diabetes mellitus without complication ([place])   . GERD (gastroesophageal reflux disease)   . Headache    daily - AM (has not been able to have SPG blocks lately)  . Hyperlipidemia   . Hypertension   . Neuropathy    feet  . Vertigo    3-4x/yr    Social History   Socioeconomic History  . Marital status: Married    Spouse name: Not on file  . Number of children: 1  . Years of education: H/S  . Highest education level: Not on file  Occupational  History  . Not on file  Tobacco Use  . Smoking status: Former Smoker    Quit date: 2013    Years since quitting: 8.7  . Smokeless tobacco: Never Used  Vaping Use  . Vaping Use: Never used  Substance and Sexual Activity  . Alcohol use: No    Alcohol/week: 0.0 standard drinks  . Drug use: Not on file  . Sexual activity: Not on file  Other Topics Concern  . Not on file  Social History Narrative   Right handed   Lives in a single story home with husband and son.   Social Determinants of Health   Financial Resource Strain:   . Difficulty of Paying Living Expenses: Not on file  Food Insecurity:   . Worried About Charity fundraiser in the Last Year: Not on file  . Ran Out of Food in the Last Year: Not on file  Transportation Needs:   . Lack of Transportation (Medical): Not on file  . Lack of Transportation (Non-Medical): Not  on file  Physical Activity:   . Days of Exercise per Week: Not on file  . Minutes of Exercise per Session: Not on file  Stress:   . Feeling of Stress : Not on file  Social Connections:   . Frequency of Communication with Friends and Family: Not on file  . Frequency of Social Gatherings with Friends and Family: Not on file  . Attends Religious Services: Not on file  . Active Member of Clubs or Organizations: Not on file  . Attends Archivist Meetings: Not on file  . Marital Status: Not on file  Intimate Partner Violence:   . Fear of Current or Ex-Partner: Not on file  . Emotionally Abused: Not on file  . Physically Abused: Not on file  . Sexually Abused: Not on file    Past Surgical History:  Procedure Laterality Date  . ABDOMINAL HYSTERECTOMY     But still has cervix  . CESAREAN SECTION    . CHOLECYSTECTOMY    . OVARIAN CYST SURGERY    . SHOULDER SURGERY Left 12/26/014   Dr. Little Ishikawa, [facility]  . TONSILLECTOMY AND ADENOIDECTOMY    . UTERINE FIBROID SURGERY      Family History  Problem Relation Age of Onset  . Diabetes Father   . Heart disease Father   . Hypertension Father   . Hyperlipidemia Father   . Congestive Heart Failure Father   . Healthy Brother   . Osteoporosis Mother   . Irritable bowel syndrome Mother   . Osteoporosis Maternal Grandmother     Allergies  Allergen Reactions  . Celecoxib Shortness Of Breath  . Pregabalin Shortness Of Breath  . Buspar [Buspirone] Other (See Comments)    Tachycardia  . Citalopram Cough       Assessment & Plan:   1. Blue toe syndrome of left lower extremity (HCC) Noninvasive studies done today do not indicate any acute ischemic event to cause her blue toe.  To the patient's severe neuropathy she is unsure as to whether she hit her toe, which is a definite possibility given the small wound.  Other differentials include possible microvascular disease, as well as a possible thrombotic event.  What is somewhat  concerning is the cool feeling of the toes despite no obvious atherosclerotic disease.. We will place the patient on Plavix to see if this helps to decrease the levels of the toe.  The patient is also advised to place some  Bactroban ointment on the wound daily once is clean and dry.  We will have the patient return in 5 weeks to reevaluate toe as well as to evaluate noninvasive studies to further rule out any possible atherosclerotic involvement.  2. Diabetic polyneuropathy associated with type 2 diabetes mellitus ([place]) Continue hypoglycemic medications as already ordered, these medications have been reviewed and there are no changes at this time.  Hgb A1C to be monitored as already arranged by primary service   3. Essential hypertension Continue antihypertensive medications as already ordered, these medications have been reviewed and there are no changes at this time.   Current Outpatient Medications on File Prior to Visit  Medication Sig Dispense Refill  . atorvastatin (LIPITOR) 20 MG tablet Take 1 tablet (20 mg total) by mouth daily. 90 tablet 3  . cephALEXin (KEFLEX) 500 MG capsule Take 1 capsule (500 mg total) by mouth 3 (three) times daily for 10 days. 30 capsule 0  . cyclobenzaprine (FLEXERIL) 5 MG tablet Take 1 tablet (5 mg total) by mouth 3 (three) times daily as needed for muscle spasms. 270 tablet 1  . furosemide (LASIX) 20 MG tablet TAKE 2 TABLETS(40 MG) BY MOUTH DAILY AS NEEDED FOR FLUID RETENTION OR SWELLING 180 tablet 3  . gabapentin (NEURONTIN) 100 MG capsule TAKE 2 CAPSULES THREE TIMES A DAY ALONG WITH 600 MG THREE TIMES A DAY FOR A TOTAL DAILY DOSE OF 2400 MG DAILY 360 capsule 3  . gabapentin (NEURONTIN) 600 MG tablet TAKE 1 TABLET(600 MG) BY MOUTH THREE TIMES DAILY 270 tablet 3  . glipiZIDE (GLUCOTROL) 10 MG tablet TAKE ONE-HALF (1/2) TABLET TWICE A DAY BEFORE MEALS 45 tablet 3  . lisinopril (ZESTRIL) 5 MG tablet Take 1 tablet (5 mg total) by mouth daily. 90 tablet 3  .  Melatonin 3 MG TABS Take 1 tablet by mouth at bedtime.    . metFORMIN (GLUMETZA) 1000 MG (MOD) 24 hr tablet Take 1,000 mg by mouth 2 (two) times daily with a meal.    . Multiple Vitamins-Minerals (MULTIVITAMIN ADULT PO) Take by mouth.    . nortriptyline (PAMELOR) 25 MG capsule Take 2 capsules (50 mg total) by mouth at bedtime. 180 capsule 3  . omeprazole (PRILOSEC) 20 MG capsule Take 1 capsule (20 mg total) by mouth daily. 90 capsule 3  . oxyCODONE-acetaminophen (PERCOCET/ROXICET) 5-325 MG tablet Take 1 tablet by mouth every 6 (six) hours as needed for severe pain. 120 tablet 0  . potassium chloride SA (KLOR-CON) 20 MEQ tablet Take 1 tablet (20 mEq total) by mouth daily. Take any time she takes lasix 90 tablet 3  . traZODone (DESYREL) 50 MG tablet Take 1-2 tablets (50-100 mg total) by mouth at bedtime as needed for sleep. 60 tablet 3  . triamterene-hydrochlorothiazide (MAXZIDE-25) 37.5-25 MG tablet Take 1 tablet by mouth daily.     No current facility-administered medications on file prior to visit.    There are no Patient Instructions on file for this visit. No follow-ups on file.   Kris Hartmann, NP

## 2020-09-13 NOTE — ED Provider Notes (Signed)
EKG from 03 September 2020 read interpreted by me shows normal sinus rhythm rate of 88 normal axis essentially normal EKG but low voltage.   Nena Polio, MD 09/13/20 [phone]

## 2020-09-13 NOTE — ED Provider Notes (Signed)
EKG from 03 September 2020 read interpreted by me shows normal sinus rhythm with a normal axis low voltage in the precordial leads but otherwise no acute changes   Nena Polio, MD 09/13/20 [phone]

## 2020-09-27 ENCOUNTER — Ambulatory Visit (INDEPENDENT_AMBULATORY_CARE_PROVIDER_SITE_OTHER): Payer: 59 | Admitting: Physician Assistant

## 2020-09-27 ENCOUNTER — Encounter: Payer: Self-pay | Admitting: Physician Assistant

## 2020-09-27 ENCOUNTER — Other Ambulatory Visit: Payer: Self-pay

## 2020-09-27 VITALS — BP 104/73 | HR 97 | Temp 98.4°F | Wt 217.0 lb

## 2020-09-27 DIAGNOSIS — Z23 Encounter for immunization: Secondary | ICD-10-CM | POA: Diagnosis not present

## 2020-09-27 DIAGNOSIS — E1129 Type 2 diabetes mellitus with other diabetic kidney complication: Secondary | ICD-10-CM

## 2020-09-27 DIAGNOSIS — E1140 Type 2 diabetes mellitus with diabetic neuropathy, unspecified: Secondary | ICD-10-CM

## 2020-09-27 DIAGNOSIS — I75022 Atheroembolism of left lower extremity: Secondary | ICD-10-CM

## 2020-09-27 DIAGNOSIS — R809 Proteinuria, unspecified: Secondary | ICD-10-CM

## 2020-09-27 MED ORDER — TRULICITY 1.5 MG/0.5ML ~~LOC~~ SOAJ: 1.5000 mg | SUBCUTANEOUS | 5 refills | Status: DC

## 2020-09-27 NOTE — Progress Notes (Signed)
Established patient visit   Patient: Gabriella Marsh   DOB: [birth year]   54 y.o. Female  MRN: [id number] Visit Date: 09/27/2020  Today's healthcare provider: Mar Daring, PA-C   Chief Complaint  Patient presents with  . Diabetes  . Hyperlipidemia   Subjective    HPI   Gabriella Marsh is a 54 yr old female that presents today for f/u T2DM. Having worsening neuropathy in her hands and feet. Has stopped driving for the most part due to numbness in her feet.   Also recently had blue toe syndrome. Was seen by vascular. She was started on Plavix and the toe has almost returned to normal color. Also had a blister on the same toe. Was placed on antibiotics and that also is almost completely healed. Did have vascular studies done and they were normal.      Medications: Outpatient Medications Prior to Visit  Medication Sig  . atorvastatin (LIPITOR) 20 MG tablet Take 1 tablet (20 mg total) by mouth daily.  . clopidogrel (PLAVIX) 75 MG tablet Take 1 tablet (75 mg total) by mouth daily.  . cyclobenzaprine (FLEXERIL) 5 MG tablet Take 1 tablet (5 mg total) by mouth 3 (three) times daily as needed for muscle spasms.  . furosemide (LASIX) 20 MG tablet TAKE 2 TABLETS(40 MG) BY MOUTH DAILY AS NEEDED FOR FLUID RETENTION OR SWELLING  . gabapentin (NEURONTIN) 100 MG capsule TAKE 2 CAPSULES THREE TIMES A DAY ALONG WITH 600 MG THREE TIMES A DAY FOR A TOTAL DAILY DOSE OF 2400 MG DAILY  . gabapentin (NEURONTIN) 600 MG tablet TAKE 1 TABLET(600 MG) BY MOUTH THREE TIMES DAILY  . glipiZIDE (GLUCOTROL) 10 MG tablet TAKE ONE-HALF (1/2) TABLET TWICE A DAY BEFORE MEALS  . lisinopril (ZESTRIL) 5 MG tablet Take 1 tablet (5 mg total) by mouth daily.  . Melatonin 3 MG TABS Take 1 tablet by mouth at bedtime.  . metFORMIN (GLUMETZA) 1000 MG (MOD) 24 hr tablet Take 1,000 mg by mouth 2 (two) times daily with a meal.  . Multiple Vitamins-Minerals (MULTIVITAMIN ADULT PO) Take by mouth.  . mupirocin  ointment (BACTROBAN) 2 % Apply 1 application topically daily.  . nortriptyline (PAMELOR) 25 MG capsule Take 2 capsules (50 mg total) by mouth at bedtime.  Marland Kitchen omeprazole (PRILOSEC) 20 MG capsule Take 1 capsule (20 mg total) by mouth daily.  Marland Kitchen oxyCODONE-acetaminophen (PERCOCET/ROXICET) 5-325 MG tablet Take 1 tablet by mouth every 6 (six) hours as needed for severe pain.  . potassium chloride SA (KLOR-CON) 20 MEQ tablet Take 1 tablet (20 mEq total) by mouth daily. Take any time she takes lasix  . traZODone (DESYREL) 50 MG tablet Take 1-2 tablets (50-100 mg total) by mouth at bedtime as needed for sleep.  Marland Kitchen triamterene-hydrochlorothiazide (MAXZIDE-25) 37.5-25 MG tablet Take 1 tablet by mouth daily.   No facility-administered medications prior to visit.    Review of Systems  Constitutional: Negative.   Respiratory: Negative.   Cardiovascular: Positive for leg swelling. Negative for chest pain and palpitations.  Gastrointestinal: Negative.   Endocrine: Positive for cold intolerance. Negative for heat intolerance, polydipsia, polyphagia and polyuria.  Neurological: Positive for dizziness and numbness. Negative for light-headedness and headaches.      Objective    BP 104/73 (BP Location: Left Arm, Patient Position: Sitting, Cuff Size: Large)   Pulse 97   Temp 98.4 F (36.9 C) (Oral)   Wt 217 lb (98.4 kg)   BMI 31.14 kg/m    Physical  Exam Vitals reviewed.  Constitutional:      General: She is not in acute distress.    Appearance: Normal appearance. She is well-developed. She is obese. She is not ill-appearing or diaphoretic.  HENT:     Head: Normocephalic and atraumatic.  Neck:     Thyroid: No thyromegaly.     Vascular: No JVD.     Trachea: No tracheal deviation.  Cardiovascular:     Rate and Rhythm: Normal rate and regular rhythm.     Heart sounds: Normal heart sounds. No murmur heard.  No friction rub. No gallop.   Pulmonary:     Effort: Pulmonary effort is normal. No  respiratory distress.     Breath sounds: Normal breath sounds. No wheezing or rales.  Musculoskeletal:     Cervical back: Normal range of motion and neck supple.  Lymphadenopathy:     Cervical: No cervical adenopathy.  Skin:    General: Skin is warm and dry.  Neurological:     Mental Status: She is alert.     Results for orders placed or performed in visit on 09/27/20  POCT glycosylated hemoglobin (Hb A1C)  Result Value Ref Range   Hemoglobin A1C 11.2 (A) 4.0 - 5.6 %   HbA1c POC (<> result, manual entry)     HbA1c, POC (prediabetic range)     HbA1c, POC (controlled diabetic range)      Assessment & Plan     1. Type 2 diabetes mellitus with microalbuminuria, without long-term current use of insulin (HCC) Worsening. Will start Trulicity back as below. One sample given to get patient through until PA approved. Continue Glipizide 10mg  BID, metformin XR 1000mg  BID - Dulaglutide (TRULICITY) 1.5 AL/9.3XT SOPN; Inject 1.5 mg into the skin once a week.  Dispense: 3 mL; Refill: 5  2. Type 2 diabetes mellitus with diabetic neuropathy, without long-term current use of insulin (HCC) Continue Gabapentin and Norco.  3. Blue toe syndrome of left lower extremity (HCC) Improved with Plavix. Keep F/U with vascular.    No follow-ups on file.      Reynolds Bowl, PA-C, have reviewed all documentation for this visit. The documentation on 10/01/20 for the exam, diagnosis, procedures, and orders are all accurate and complete.   Gabriella Marsh  [facility] [phone] (phone) [phone] (fax)  [place]

## 2020-10-01 ENCOUNTER — Encounter: Payer: Self-pay | Admitting: Physician Assistant

## 2020-10-01 DIAGNOSIS — M5137 Other intervertebral disc degeneration, lumbosacral region: Secondary | ICD-10-CM

## 2020-10-01 DIAGNOSIS — Z23 Encounter for immunization: Secondary | ICD-10-CM | POA: Diagnosis not present

## 2020-10-01 DIAGNOSIS — I75022 Atheroembolism of left lower extremity: Secondary | ICD-10-CM | POA: Insufficient documentation

## 2020-10-01 LAB — POCT GLYCOSYLATED HEMOGLOBIN (HGB A1C): Hemoglobin A1C: 11.2 % — AB (ref 4.0–5.6)

## 2020-10-01 NOTE — Addendum Note (Signed)
Addended by: Ashley Royalty E on: 10/01/2020 04:14 PM   Modules accepted: Orders

## 2020-10-02 MED ORDER — OXYCODONE-ACETAMINOPHEN 5-325 MG PO TABS: 1.0000 | ORAL_TABLET | Freq: Four times a day (QID) | ORAL | 0 refills | Status: DC | PRN

## 2020-10-09 ENCOUNTER — Other Ambulatory Visit (INDEPENDENT_AMBULATORY_CARE_PROVIDER_SITE_OTHER): Payer: Self-pay | Admitting: Nurse Practitioner

## 2020-10-09 DIAGNOSIS — I75022 Atheroembolism of left lower extremity: Secondary | ICD-10-CM

## 2020-10-10 ENCOUNTER — Ambulatory Visit (INDEPENDENT_AMBULATORY_CARE_PROVIDER_SITE_OTHER): Payer: 59 | Admitting: Nurse Practitioner

## 2020-10-10 ENCOUNTER — Encounter (INDEPENDENT_AMBULATORY_CARE_PROVIDER_SITE_OTHER): Payer: Self-pay | Admitting: Nurse Practitioner

## 2020-10-10 ENCOUNTER — Ambulatory Visit (INDEPENDENT_AMBULATORY_CARE_PROVIDER_SITE_OTHER): Payer: 59

## 2020-10-10 ENCOUNTER — Encounter: Payer: Self-pay | Admitting: Physician Assistant

## 2020-10-10 ENCOUNTER — Other Ambulatory Visit: Payer: Self-pay

## 2020-10-10 VITALS — BP 111/72 | HR 92 | Resp 16 | Wt 214.0 lb

## 2020-10-10 DIAGNOSIS — I75022 Atheroembolism of left lower extremity: Secondary | ICD-10-CM

## 2020-10-10 DIAGNOSIS — R809 Proteinuria, unspecified: Secondary | ICD-10-CM

## 2020-10-10 DIAGNOSIS — E1129 Type 2 diabetes mellitus with other diabetic kidney complication: Secondary | ICD-10-CM | POA: Diagnosis not present

## 2020-10-10 DIAGNOSIS — E7800 Pure hypercholesterolemia, unspecified: Secondary | ICD-10-CM

## 2020-10-10 DIAGNOSIS — M5137 Other intervertebral disc degeneration, lumbosacral region: Secondary | ICD-10-CM

## 2020-10-12 ENCOUNTER — Encounter: Payer: Self-pay | Admitting: Emergency Medicine

## 2020-10-12 ENCOUNTER — Emergency Department
Admission: EM | Admit: 2020-10-12 | Discharge: 2020-10-12 | Disposition: A | Discharge status: Home / Self Care | Payer: 59 | Attending: Emergency Medicine | Admitting: Emergency Medicine

## 2020-10-12 ENCOUNTER — Emergency Department: Payer: 59

## 2020-10-12 ENCOUNTER — Other Ambulatory Visit: Payer: Self-pay

## 2020-10-12 DIAGNOSIS — W010XXA Fall on same level from slipping, tripping and stumbling without subsequent striking against object, initial encounter: Secondary | ICD-10-CM | POA: Diagnosis not present

## 2020-10-12 DIAGNOSIS — S42292A Other displaced fracture of upper end of left humerus, initial encounter for closed fracture: Secondary | ICD-10-CM | POA: Insufficient documentation

## 2020-10-12 DIAGNOSIS — E1140 Type 2 diabetes mellitus with diabetic neuropathy, unspecified: Secondary | ICD-10-CM | POA: Diagnosis not present

## 2020-10-12 DIAGNOSIS — Z87891 Personal history of nicotine dependence: Secondary | ICD-10-CM | POA: Insufficient documentation

## 2020-10-12 DIAGNOSIS — Z79899 Other long term (current) drug therapy: Secondary | ICD-10-CM | POA: Insufficient documentation

## 2020-10-12 DIAGNOSIS — I10 Essential (primary) hypertension: Secondary | ICD-10-CM | POA: Diagnosis not present

## 2020-10-12 DIAGNOSIS — Z7984 Long term (current) use of oral hypoglycemic drugs: Secondary | ICD-10-CM | POA: Diagnosis not present

## 2020-10-12 DIAGNOSIS — S4992XA Unspecified injury of left shoulder and upper arm, initial encounter: Secondary | ICD-10-CM | POA: Diagnosis present

## 2020-10-12 HISTORY — PX: OTHER SURGICAL HISTORY: SHX169

## 2020-10-12 MED ORDER — FENTANYL CITRATE (PF) 100 MCG/2ML IJ SOLN
50.0000 ug | Freq: Once | INTRAMUSCULAR | Status: AC
  Administered 2020-10-12: 50 ug via INTRAVENOUS
  Filled 2020-10-12: qty 2

## 2020-10-12 NOTE — Discharge Instructions (Addendum)
Continue the pain medicine you have previously been prescribed.  Please use the shoulder immobilizer until you follow-up with orthopedics.

## 2020-10-12 NOTE — ED Triage Notes (Signed)
Patient states that she tripped and fell and that she heard her left shoulder pop. Patient with complaint of left shoulder pain.

## 2020-10-13 NOTE — ED Provider Notes (Signed)
[facility] Emergency Department Provider Note  ____________________________________________   First MD Initiated Contact with Patient 10/12/20 2148     (approximate)  I have reviewed the triage vital signs and the nursing notes.   HISTORY  Chief Complaint Shoulder Pain  HPI Gabriella Marsh is a 54 y.o. female who presents to the emergency department for evaluation of left shoulder pain.  The patient states that she was at the [facility] when she had a mechanical fall on the bleachers that she believes was caused because of her lower extremity peripheral neuropathy and she tripped.  She tried to catch herself with her left arm and felt immediate pop and pain in her left shoulder region.  She was evaluated by the medic onsite who placed her in a swath sling before her presentation here.  She states her pain is an 8/10 and is located in the left shoulder.  Pain is made worse with any movement of the left shoulder.  Pain is improved but not absent with being completely still.  She of note has had history of injury to this left shoulder with a procedure completed 5 or so years ago on it.   She is unsure of exactly what this surgery was. Of note, she has a prescription for regular narcotic use related to her peripheral neuropathy. She states that she has had 2 Percocet today but the prescription is written that she can take up to 4/day. She works from home at a computer and is right-hand dominant.        Past Medical History:  Diagnosis Date  . Arthritis    knees  . Degenerative disc disease, lumbar   . Diabetes mellitus without complication ([place])   . GERD (gastroesophageal reflux disease)   . Headache    daily - AM (has not been able to have SPG blocks lately)  . Hyperlipidemia   . Hypertension   . Neuropathy    feet  . Vertigo    3-4x/yr    Patient Active Problem List   Diagnosis Date Noted  . Blue toe syndrome of left lower extremity ([place])  10/01/2020  . Type 2 diabetes mellitus with microalbuminuria, without long-term current use of insulin ([place]) 05/23/2020  . Cystitis 05/16/2020  . Schamberg's purpura 10/09/2019  . Leg swelling 10/09/2019  . Depression, major, single episode, moderate ([place]) 01/23/2019  . Benign positional vertigo 02/03/2016  . Common migraine with intractable migraine 06/18/2015  . Anxiety 05/04/2015  . Adaptation reaction 05/03/2015  . DDD (degenerative disc disease), lumbosacral 05/03/2015  . Type 2 diabetes mellitus with diabetic neuropathy, without long-term current use of insulin ([place]) 05/03/2015  . Hypercholesteremia 05/03/2015  . Insomnia due to medical condition 05/03/2015  . Adiposity 05/03/2015  . Disorder of peripheral nervous system 05/03/2015  . Snores 05/03/2015  . Diabetic neuropathy ([place]) 03/06/2015  . Essential hypertension 03/06/2015  . Elevated liver function tests 03/06/2015  . Obesity 03/06/2015  . GERD (gastroesophageal reflux disease) 03/06/2015  . Multinodular goiter 03/06/2015  . RLS (restless legs syndrome) 03/06/2015  . Leg cramps 03/06/2015    Past Surgical History:  Procedure Laterality Date  . ABDOMINAL HYSTERECTOMY     But still has cervix  . CESAREAN SECTION    . CHOLECYSTECTOMY    . OVARIAN CYST SURGERY    . SHOULDER SURGERY Left 12/26/014   Dr. Little Ishikawa, [facility]  . TONSILLECTOMY AND ADENOIDECTOMY    . UTERINE FIBROID SURGERY      Prior  to Admission medications   Medication Sig Start Date End Date Taking? Authorizing Provider  atorvastatin (LIPITOR) 20 MG tablet Take 1 tablet (20 mg total) by mouth daily. 04/01/20   Mar Daring, PA-C  clopidogrel (PLAVIX) 75 MG tablet Take 1 tablet (75 mg total) by mouth daily. 09/05/20   Kris Hartmann, NP  cyclobenzaprine (FLEXERIL) 5 MG tablet Take 1 tablet (5 mg total) by mouth 3 (three) times daily as needed for muscle spasms. 05/22/20   Mar Daring, PA-C  Dulaglutide (TRULICITY) 1.5 DQ/2.2WL SOPN Inject  1.5 mg into the skin once a week. 09/27/20   Mar Daring, PA-C  furosemide (LASIX) 20 MG tablet TAKE 2 TABLETS(40 MG) BY MOUTH DAILY AS NEEDED FOR FLUID RETENTION OR SWELLING 04/01/20   Burnette, Clearnce Sorrel, PA-C  gabapentin (NEURONTIN) 100 MG capsule TAKE 2 CAPSULES THREE TIMES A DAY ALONG WITH 600 MG THREE TIMES A DAY FOR A TOTAL DAILY DOSE OF 2400 MG DAILY 04/01/20   [name] M, PA-C  gabapentin (NEURONTIN) 600 MG tablet TAKE 1 TABLET(600 MG) BY MOUTH THREE TIMES DAILY 04/14/20   Mar Daring, PA-C  glipiZIDE (GLUCOTROL) 10 MG tablet TAKE ONE-HALF (1/2) TABLET TWICE A DAY BEFORE MEALS 04/01/20   Mar Daring, PA-C  lisinopril (ZESTRIL) 5 MG tablet Take 1 tablet (5 mg total) by mouth daily. 05/23/20   Mar Daring, PA-C  Melatonin 3 MG TABS Take 1 tablet by mouth at bedtime.    [provider]  metFORMIN (GLUMETZA) 1000 MG (MOD) 24 hr tablet Take 1,000 mg by mouth 2 (two) times daily with a meal.    [provider]  Multiple Vitamins-Minerals (MULTIVITAMIN ADULT PO) Take by mouth.    [provider]  mupirocin ointment (BACTROBAN) 2 % Apply 1 application topically daily. 09/05/20   Kris Hartmann, NP  nortriptyline (PAMELOR) 25 MG capsule Take 2 capsules (50 mg total) by mouth at bedtime. 04/01/20   Mar Daring, PA-C  omeprazole (PRILOSEC) 20 MG capsule Take 1 capsule (20 mg total) by mouth daily. 04/01/20   Mar Daring, PA-C  oxyCODONE-acetaminophen (PERCOCET/ROXICET) 5-325 MG tablet Take 1 tablet by mouth every 6 (six) hours as needed for severe pain. 10/02/20   Mar Daring, PA-C  potassium chloride SA (KLOR-CON) 20 MEQ tablet Take 1 tablet (20 mEq total) by mouth daily. Take any time she takes lasix 04/01/20   Mar Daring, PA-C  traZODone (DESYREL) 50 MG tablet Take 1-2 tablets (50-100 mg total) by mouth at bedtime as needed for sleep. 04/01/20   Mar Daring, PA-C    triamterene-hydrochlorothiazide (MAXZIDE-25) 37.5-25 MG tablet Take 1 tablet by mouth daily.    [provider]    Allergies Celecoxib, Pregabalin, Buspar [buspirone], and Citalopram  Family History  Problem Relation Age of Onset  . Diabetes Father   . Heart disease Father   . Hypertension Father   . Hyperlipidemia Father   . Congestive Heart Failure Father   . Healthy Brother   . Osteoporosis Mother   . Irritable bowel syndrome Mother   . Osteoporosis Maternal Grandmother     Social History Social History   Tobacco Use  . Smoking status: Former Smoker    Quit date: 2013    Years since quitting: 8.8  . Smokeless tobacco: Never Used  Vaping Use  . Vaping Use: Never used  Substance Use Topics  . Alcohol use: No    Alcohol/week: 0.0 standard drinks  .  Drug use: Never    Review of Systems  Constitutional: No fever/chills Eyes: No visual changes. ENT: No sore throat. Cardiovascular: Denies chest pain. Respiratory: Denies shortness of breath. Gastrointestinal: No abdominal pain.  No nausea, no vomiting.  No diarrhea.  No constipation. Genitourinary: Negative for dysuria. Musculoskeletal: + Left shoulder pain, negative for back pain. Skin: Negative for rash. Neurological: Negative for headaches, focal weakness or numbness.  ____________________________________________   PHYSICAL EXAM:  VITAL SIGNS: ED Triage Vitals  Enc Vitals Group     BP 10/12/20 2047 (!) 141/80     Pulse Rate 10/12/20 2047 (!) 110     Resp 10/12/20 2047 19     Temp 10/12/20 2047 98.1 F (36.7 C)     Temp Source 10/12/20 2047 Oral     SpO2 10/12/20 2047 98 %     Weight 10/12/20 2047 214 lb (97.1 kg)     Height 10/12/20 2047 5\' 10"  (1.778 m)     Head Circumference --      Peak Flow --      Pain Score 10/12/20 2050 8     Pain Loc --      Pain Edu? --      Excl. in [place]? --     Constitutional: Alert and oriented. Appears uncomfortable with shoulder in a swath sling. Eyes:  Conjunctivae are normal. PERRL. EOMI. Head: Atraumatic. Nose: No congestion/rhinnorhea. Mouth/Throat: Mucous membranes are moist.  Oropharynx non-erythematous. Neck: No stridor. No cervical spine tenderness to palpation. Cardiovascular: Normal rate, regular rhythm. Grossly normal heart sounds.  Good peripheral circulation. Respiratory: Normal respiratory effort.  No retractions. Lungs CTAB. Musculoskeletal: There is tenderness about the proximal humerus. No tenderness across the clavicle of the left side. She is distally neurovascularly intact. Neurologic:  Normal speech and language. No gross focal neurologic deficits are appreciated. No gait instability. Skin:  Skin is warm, dry and intact. No rash noted. Psychiatric: Mood and affect are normal. Speech and behavior are normal.  ____________________________________________  RADIOLOGY I, Marlana Salvage, personally viewed and evaluated these images (plain radiographs) as part of my medical decision making, as well as reviewing the written report by the radiologist.  ED provider interpretation: Proximal humerus fracture with minimal displacement  Official radiology report(s): DG Shoulder Left  Result Date: 10/12/2020 CLINICAL DATA:  Pain, fall EXAM: LEFT SHOULDER - 2+ VIEW COMPARISON:  None. FINDINGS: There is a comminuted slightly impacted fracture seen through the surgical neck and greater tuberosity. The humeral head is still well seated within the glenoid. Overlying soft tissue swelling is seen. IMPRESSION: Comminuted slightly impacted proximal humerus fracture. Electronically Signed   By: Prudencio Pair M.D.   On: 10/12/2020 21:36    ____________________________________________   INITIAL IMPRESSION / ASSESSMENT AND PLAN / ED COURSE  As part of my medical decision making, I reviewed the following data within the [place] notes reviewed and incorporated and Radiograph reviewed        Patient is a  54 year old female who presents to the emergency department for evaluation of left shoulder pain following a mechanical fall at the [facility]. She has exquisite tenderness to the left shoulder region, but is neurovascularly intact. X-rays demonstrate a proximal humerus fracture with minimal displacement. At this time, I will place the patient in a more secure shoulder immobilizer until she can follow-up with orthopedics. I will not prescribe her any narcotic pain medication given that she already has a regular prescription from this. The patient is amenable  with this plan. She will follow up with orthopedics or return to the emergency department for any worsening.      ____________________________________________   FINAL CLINICAL IMPRESSION(S) / ED DIAGNOSES  Final diagnoses:  Other closed displaced fracture of proximal end of left humerus, initial encounter     ED Discharge Orders    None      *Please note:  Kirsten Spearing was evaluated in Emergency Department on 10/13/2020 for the symptoms described in the history of present illness. She was evaluated in the context of the global COVID-19 pandemic, which necessitated consideration that the patient might be at risk for infection with the SARS-CoV-2 virus that causes COVID-19. Institutional protocols and algorithms that pertain to the evaluation of patients at risk for COVID-19 are in a state of rapid change based on information released by regulatory bodies including the CDC and federal and state organizations. These policies and algorithms were followed during the patient's care in the ED.  Some ED evaluations and interventions may be delayed as a result of limited staffing during and the pandemic.*   Note:  This document was prepared using Dragon voice recognition software and may include unintentional dictation errors.    Marlana Salvage, PA 10/13/20 [place]    Carrie Mew, MD 10/13/20 [id number]

## 2020-10-14 ENCOUNTER — Encounter (INDEPENDENT_AMBULATORY_CARE_PROVIDER_SITE_OTHER): Payer: Self-pay | Admitting: Nurse Practitioner

## 2020-10-14 NOTE — Progress Notes (Signed)
Subjective:    Patient ID: Gabriella Marsh, female    DOB: 1966/06/21, 54 y.o.   MRN: [id number] Chief Complaint  Patient presents with  . Follow-up    5wk ultrasound follow up    Patient returns today for evaluation of her blue toe.  At this time the blue toe has completely resolved.  The patient was started on Plavix at her previous office visit.  The patient notes that it has resolved and she has not had any pain or issues.  The previous ulceration that was present is also healed at this time.  She denies any fever, chills, nausea, vomiting or diarrhea.  She does not have any claudication-like symptoms however patient does have severe peripheral neuropathy.  She denies any rest pain like symptoms.  She denies any new wounds or ulcerations.  Today noninvasive studies show no evidence of an abdominal aortic aneurysm.  The largest measurement is 2.3 cm.  Patient also had a right lower extremity arterial duplex performed which shows triphasic waveforms throughout the level of the lower extremity.  With no evidence of   Review of Systems  Skin: Positive for color change.  Neurological: Positive for numbness.  All other systems reviewed and are negative.      Objective:   Physical Exam Vitals reviewed.  HENT:     Head: Normocephalic.  Cardiovascular:     Rate and Rhythm: Normal rate.     Pulses: Decreased pulses.  Pulmonary:     Effort: Pulmonary effort is normal.  Neurological:     Mental Status: She is alert.     Gait: Gait abnormal.  Psychiatric:        Mood and Affect: Mood normal.        Behavior: Behavior normal.        Thought Content: Thought content normal.        Judgment: Judgment normal.     BP 111/72 (BP Location: Right Arm)   Pulse 92   Resp 16   Wt 214 lb (97.1 kg)   BMI 30.71 kg/m   Past Medical History:  Diagnosis Date  . Arthritis    knees  . Degenerative disc disease, lumbar   . Diabetes mellitus without complication ([place])   . GERD  (gastroesophageal reflux disease)   . Headache    daily - AM (has not been able to have SPG blocks lately)  . Hyperlipidemia   . Hypertension   . Neuropathy    feet  . Vertigo    3-4x/yr    Social History   Socioeconomic History  . Marital status: Married    Spouse name: Not on file  . Number of children: 1  . Years of education: H/S  . Highest education level: Not on file  Occupational History  . Not on file  Tobacco Use  . Smoking status: Former Smoker    Quit date: 2013    Years since quitting: 8.8  . Smokeless tobacco: Never Used  Vaping Use  . Vaping Use: Never used  Substance and Sexual Activity  . Alcohol use: No    Alcohol/week: 0.0 standard drinks  . Drug use: Never  . Sexual activity: Not on file  Other Topics Concern  . Not on file  Social History Narrative   Right handed   Lives in a single story home with husband and son.   Social Determinants of Health   Financial Resource Strain:   . Difficulty of Paying Living Expenses: Not on  file  Food Insecurity:   . Worried About Charity fundraiser in the Last Year: Not on file  . Ran Out of Food in the Last Year: Not on file  Transportation Needs:   . Lack of Transportation (Medical): Not on file  . Lack of Transportation (Non-Medical): Not on file  Physical Activity:   . Days of Exercise per Week: Not on file  . Minutes of Exercise per Session: Not on file  Stress:   . Feeling of Stress : Not on file  Social Connections:   . Frequency of Communication with Friends and Family: Not on file  . Frequency of Social Gatherings with Friends and Family: Not on file  . Attends Religious Services: Not on file  . Active Member of Clubs or Organizations: Not on file  . Attends Archivist Meetings: Not on file  . Marital Status: Not on file  Intimate Partner Violence:   . Fear of Current or Ex-Partner: Not on file  . Emotionally Abused: Not on file  . Physically Abused: Not on file  . Sexually  Abused: Not on file    Past Surgical History:  Procedure Laterality Date  . ABDOMINAL HYSTERECTOMY     But still has cervix  . CESAREAN SECTION    . CHOLECYSTECTOMY    . OVARIAN CYST SURGERY    . SHOULDER SURGERY Left 12/26/014   Dr. Little Ishikawa, [facility]  . TONSILLECTOMY AND ADENOIDECTOMY    . UTERINE FIBROID SURGERY      Family History  Problem Relation Age of Onset  . Diabetes Father   . Heart disease Father   . Hypertension Father   . Hyperlipidemia Father   . Congestive Heart Failure Father   . Healthy Brother   . Osteoporosis Mother   . Irritable bowel syndrome Mother   . Osteoporosis Maternal Grandmother     Allergies  Allergen Reactions  . Celecoxib Shortness Of Breath  . Pregabalin Shortness Of Breath  . Buspar [Buspirone] Other (See Comments)    Tachycardia  . Citalopram Cough       Assessment & Plan:   1. Blue toe syndrome of left lower extremity (HCC)  Recommend:   The patient's blue toe has resolved at this time.  This may have been related to  microvascular disease.  Noninvasive studies do not suggest clinically significant change.  No invasive studies, angiography or surgery at this time The patient should continue walking and begin a more formal exercise program.  The patient should continue antiplatelet therapy and aggressive treatment of the lipid abnormalities  No changes in the patient's medications at this time  The patient should continue wearing graduated compression socks 10-15 mmHg strength to control the mild edema.   Patient should contact our office if the bleeding returns, otherwise will follow up in 1 year. 2. Hypercholesteremia Continue statin as ordered and reviewed, no changes at this time  3. Type 2 diabetes mellitus with microalbuminuria, without long-term current use of insulin (HCC) Continue hypoglycemic medications as already ordered, these medications have been reviewed and there are no changes at this time.  Hgb A1C to  be monitored as already arranged by primary service    Current Outpatient Medications on File Prior to Visit  Medication Sig Dispense Refill  . atorvastatin (LIPITOR) 20 MG tablet Take 1 tablet (20 mg total) by mouth daily. 90 tablet 3  . clopidogrel (PLAVIX) 75 MG tablet Take 1 tablet (75 mg total) by mouth daily. [place]  tablet 6  . cyclobenzaprine (FLEXERIL) 5 MG tablet Take 1 tablet (5 mg total) by mouth 3 (three) times daily as needed for muscle spasms. 270 tablet 1  . Dulaglutide (TRULICITY) 1.5 XM/1.4JW SOPN Inject 1.5 mg into the skin once a week. 3 mL 5  . furosemide (LASIX) 20 MG tablet TAKE 2 TABLETS(40 MG) BY MOUTH DAILY AS NEEDED FOR FLUID RETENTION OR SWELLING 180 tablet 3  . gabapentin (NEURONTIN) 100 MG capsule TAKE 2 CAPSULES THREE TIMES A DAY ALONG WITH 600 MG THREE TIMES A DAY FOR A TOTAL DAILY DOSE OF 2400 MG DAILY 360 capsule 3  . gabapentin (NEURONTIN) 600 MG tablet TAKE 1 TABLET(600 MG) BY MOUTH THREE TIMES DAILY 270 tablet 3  . glipiZIDE (GLUCOTROL) 10 MG tablet TAKE ONE-HALF (1/2) TABLET TWICE A DAY BEFORE MEALS 45 tablet 3  . lisinopril (ZESTRIL) 5 MG tablet Take 1 tablet (5 mg total) by mouth daily. 90 tablet 3  . Melatonin 3 MG TABS Take 1 tablet by mouth at bedtime.    . metFORMIN (GLUMETZA) 1000 MG (MOD) 24 hr tablet Take 1,000 mg by mouth 2 (two) times daily with a meal.    . Multiple Vitamins-Minerals (MULTIVITAMIN ADULT PO) Take by mouth.    . mupirocin ointment (BACTROBAN) 2 % Apply 1 application topically daily. 22 g 0  . nortriptyline (PAMELOR) 25 MG capsule Take 2 capsules (50 mg total) by mouth at bedtime. 180 capsule 3  . omeprazole (PRILOSEC) 20 MG capsule Take 1 capsule (20 mg total) by mouth daily. 90 capsule 3  . oxyCODONE-acetaminophen (PERCOCET/ROXICET) 5-325 MG tablet Take 1 tablet by mouth every 6 (six) hours as needed for severe pain. 120 tablet 0  . potassium chloride SA (KLOR-CON) 20 MEQ tablet Take 1 tablet (20 mEq total) by mouth daily. Take any  time she takes lasix 90 tablet 3  . traZODone (DESYREL) 50 MG tablet Take 1-2 tablets (50-100 mg total) by mouth at bedtime as needed for sleep. 60 tablet 3  . triamterene-hydrochlorothiazide (MAXZIDE-25) 37.5-25 MG tablet Take 1 tablet by mouth daily.     No current facility-administered medications on file prior to visit.    There are no Patient Instructions on file for this visit. No follow-ups on file.   Kris Hartmann, NP

## 2020-10-21 ENCOUNTER — Ambulatory Visit: Payer: 59 | Admitting: Neurology

## 2020-10-30 ENCOUNTER — Other Ambulatory Visit: Payer: Self-pay | Admitting: Physician Assistant

## 2020-10-30 DIAGNOSIS — F5101 Primary insomnia: Secondary | ICD-10-CM

## 2020-10-30 NOTE — Telephone Encounter (Signed)
Requested Prescriptions  Pending Prescriptions Disp Refills   traZODone (DESYREL) 50 MG tablet [Pharmacy Med Name: TRAZODONE 50MG  TABLETS] 60 tablet 3    Sig: TAKE 1 TO 2 TABLETS(50 TO 100 MG) BY MOUTH AT BEDTIME AS NEEDED FOR SLEEP     Psychiatry: Antidepressants - Serotonin Modulator Passed - 10/30/2020  3:12 AM      Passed - Completed PHQ-2 or PHQ-9 in the last 360 days      Passed - Valid encounter within last 6 months    Recent Outpatient Visits          1 month ago Type 2 diabetes mellitus with microalbuminuria, without long-term current use of insulin [facility])   [place], [place]   5 months ago Possible urinary tract infection   [facility] [employer], [place], [place]   5 months ago [place], Dionne Bucy, [place]   7 months ago [place], [place]   8 months ago Herpes zoster without complication   [facility] [place], Clearnce Sorrel, [place]      Future Appointments            In 2 months [place], Clearnce Sorrel, PA-C [employer], [place]

## 2020-10-31 ENCOUNTER — Encounter: Payer: Self-pay | Admitting: Physician Assistant

## 2020-10-31 DIAGNOSIS — M5137 Other intervertebral disc degeneration, lumbosacral region: Secondary | ICD-10-CM

## 2020-10-31 MED ORDER — OXYCODONE-ACETAMINOPHEN 5-325 MG PO TABS: 1.0000 | ORAL_TABLET | Freq: Four times a day (QID) | ORAL | 0 refills | Status: DC | PRN

## 2020-11-13 ENCOUNTER — Telehealth: Payer: Self-pay

## 2020-11-13 NOTE — Telephone Encounter (Signed)
Copied from [place] [id number]. Topic: General - Inquiry >> Nov 13, 2020 10:18 AM Scherrie Gerlach wrote: Reason for CRM: pt states she has NOT had her diabetic exam.  She was called this am and LM

## 2020-11-24 ENCOUNTER — Other Ambulatory Visit: Payer: Self-pay | Admitting: Physician Assistant

## 2020-11-24 DIAGNOSIS — E1140 Type 2 diabetes mellitus with diabetic neuropathy, unspecified: Secondary | ICD-10-CM

## 2020-11-24 DIAGNOSIS — E1142 Type 2 diabetes mellitus with diabetic polyneuropathy: Secondary | ICD-10-CM

## 2020-11-24 DIAGNOSIS — IMO0002 Reserved for concepts with insufficient information to code with codable children: Secondary | ICD-10-CM

## 2020-11-24 NOTE — Telephone Encounter (Signed)
Per chart review, dated 09/27/20, glipizide dose is 10 mg twice daily. Sig on med is to take 1/2 tablet (5 mg ) twice daily- please review.

## 2020-12-02 ENCOUNTER — Encounter: Payer: Self-pay | Admitting: Physician Assistant

## 2020-12-02 DIAGNOSIS — M5137 Other intervertebral disc degeneration, lumbosacral region: Secondary | ICD-10-CM

## 2020-12-02 MED ORDER — OXYCODONE-ACETAMINOPHEN 5-325 MG PO TABS: 1.0000 | ORAL_TABLET | Freq: Four times a day (QID) | ORAL | 0 refills | Status: DC | PRN

## 2021-01-03 ENCOUNTER — Ambulatory Visit (INDEPENDENT_AMBULATORY_CARE_PROVIDER_SITE_OTHER): Payer: 59 | Admitting: Physician Assistant

## 2021-01-03 ENCOUNTER — Other Ambulatory Visit: Payer: Self-pay

## 2021-01-03 ENCOUNTER — Encounter: Payer: Self-pay | Admitting: Physician Assistant

## 2021-01-03 VITALS — BP 100/73 | HR 97 | Temp 97.8°F | Ht 70.0 in | Wt 216.4 lb

## 2021-01-03 DIAGNOSIS — I10 Essential (primary) hypertension: Secondary | ICD-10-CM | POA: Diagnosis not present

## 2021-01-03 DIAGNOSIS — Z1159 Encounter for screening for other viral diseases: Secondary | ICD-10-CM

## 2021-01-03 DIAGNOSIS — Z1211 Encounter for screening for malignant neoplasm of colon: Secondary | ICD-10-CM

## 2021-01-03 DIAGNOSIS — M5137 Other intervertebral disc degeneration, lumbosacral region: Secondary | ICD-10-CM

## 2021-01-03 DIAGNOSIS — E1129 Type 2 diabetes mellitus with other diabetic kidney complication: Secondary | ICD-10-CM

## 2021-01-03 DIAGNOSIS — E1140 Type 2 diabetes mellitus with diabetic neuropathy, unspecified: Secondary | ICD-10-CM

## 2021-01-03 DIAGNOSIS — R809 Proteinuria, unspecified: Secondary | ICD-10-CM

## 2021-01-03 DIAGNOSIS — E1142 Type 2 diabetes mellitus with diabetic polyneuropathy: Secondary | ICD-10-CM

## 2021-01-03 DIAGNOSIS — E042 Nontoxic multinodular goiter: Secondary | ICD-10-CM | POA: Diagnosis not present

## 2021-01-03 DIAGNOSIS — I75022 Atheroembolism of left lower extremity: Secondary | ICD-10-CM | POA: Diagnosis not present

## 2021-01-03 DIAGNOSIS — Z0000 Encounter for general adult medical examination without abnormal findings: Secondary | ICD-10-CM | POA: Diagnosis not present

## 2021-01-03 DIAGNOSIS — E7800 Pure hypercholesterolemia, unspecified: Secondary | ICD-10-CM

## 2021-01-03 DIAGNOSIS — R7989 Other specified abnormal findings of blood chemistry: Secondary | ICD-10-CM

## 2021-01-03 MED ORDER — OXYCODONE-ACETAMINOPHEN 5-325 MG PO TABS: 1.0000 | ORAL_TABLET | Freq: Four times a day (QID) | ORAL | 0 refills | Status: DC | PRN

## 2021-01-03 NOTE — Patient Instructions (Signed)
[facility] at [place],  [place]  [address] Main: [phone]   Preventive Care 74-54 Years Old, Female Preventive care refers to lifestyle choices and visits with your health care provider that can promote health and wellness. This includes:  A yearly physical exam. This is also called an annual wellness visit.  Regular dental and eye exams.  Immunizations.  Screening for certain conditions.  Healthy lifestyle choices, such as: ? Eating a healthy diet. ? Getting regular exercise. ? Not using drugs or products that contain nicotine and tobacco. ? Limiting alcohol use. What can I expect for my preventive care visit? Physical exam Your health care provider will check your:  Height and weight. These may be used to calculate your BMI (body mass index). BMI is a measurement that tells if you are at a healthy weight.  Heart rate and blood pressure.  Body temperature.  Skin for abnormal spots. Counseling Your health care provider may ask you questions about your:  Past medical problems.  Family's medical history.  Alcohol, tobacco, and drug use.  Emotional well-being.  Home life and relationship well-being.  Sexual activity.  Diet, exercise, and sleep habits.  Work and work Statistician.  Access to firearms.  Method of birth control.  Menstrual cycle.  Pregnancy history. What immunizations do I need? Vaccines are usually given at various ages, according to a schedule. Your health care provider will recommend vaccines for you based on your age, medical history, and lifestyle or other factors, such as travel or where you work.   What tests do I need? Blood tests  Lipid and cholesterol levels. These may be checked every 5 years, or more often if you are over 66 years old.  Hepatitis C test.  Hepatitis B test. Screening  Lung cancer screening. You may have this screening every year starting at age 58 if you  have a 30-pack-year history of smoking and currently smoke or have quit within the past 15 years.  Colorectal cancer screening. ? All adults should have this screening starting at age 72 and continuing until age 4. ? Your health care provider may recommend screening at age 19 if you are at increased risk. ? You will have tests every 1-10 years, depending on your results and the type of screening test.  Diabetes screening. ? This is done by checking your blood sugar (glucose) after you have not eaten for a while (fasting). ? You may have this done every 1-3 years.  Mammogram. ? This may be done every 1-2 years. ? Talk with your health care provider about when you should start having regular mammograms. This may depend on whether you have a family history of breast cancer.  BRCA-related cancer screening. This may be done if you have a family history of breast, ovarian, tubal, or peritoneal cancers.  Pelvic exam and Pap test. ? This may be done every 3 years starting at age 25. ? Starting at age 23, this may be done every 5 years if you have a Pap test in combination with an HPV test. Other tests  STD (sexually transmitted disease) testing, if you are at risk.  Bone density scan. This is done to screen for osteoporosis. You may have this scan if you are at high risk for osteoporosis. Talk with your health care provider about your test results, treatment options, and if necessary, the need for more tests. Follow these instructions at home: Eating and drinking  Eat a diet  that includes fresh fruits and vegetables, whole grains, lean protein, and low-fat dairy products.  Take vitamin and mineral supplements as recommended by your health care provider.  Do not drink alcohol if: ? Your health care provider tells you not to drink. ? You are pregnant, may be pregnant, or are planning to become pregnant.  If you drink alcohol: ? Limit how much you have to 0-1 drink a day. ? Be aware of  how much alcohol is in your drink. In the [place] one drink equals one 12 oz bottle of beer (355 mL), one 5 oz glass of wine (148 mL), or one 1 oz glass of hard liquor (44 mL).   Lifestyle  Take daily care of your teeth and gums. Brush your teeth every morning and night with fluoride toothpaste. Floss one time each day.  Stay active. Exercise for at least 30 minutes 5 or more days each week.  Do not use any products that contain nicotine or tobacco, such as cigarettes, e-cigarettes, and chewing tobacco. If you need help quitting, ask your health care provider.  Do not use drugs.  If you are sexually active, practice safe sex. Use a condom or other form of protection to prevent STIs (sexually transmitted infections).  If you do not wish to become pregnant, use a form of birth control. If you plan to become pregnant, see your health care provider for a prepregnancy visit.  If told by your health care provider, take low-dose aspirin daily starting at age 78.  Find healthy ways to cope with stress, such as: ? Meditation, yoga, or listening to music. ? Journaling. ? Talking to a trusted person. ? Spending time with friends and family. Safety  Always wear your seat belt while driving or riding in a vehicle.  Do not drive: ? If you have been drinking alcohol. Do not ride with someone who has been drinking. ? When you are tired or distracted. ? While texting.  Wear a helmet and other protective equipment during sports activities.  If you have firearms in your house, make sure you follow all gun safety procedures. What's next?  Visit your health care provider once a year for an annual wellness visit.  Ask your health care provider how often you should have your eyes and teeth checked.  Stay up to date on all vaccines. This information is not intended to replace advice given to you by your health care provider. Make sure you discuss any questions you have with your health care  provider. Document Revised: 09/10/2020 Document Reviewed: 08/18/2018 Elsevier Patient Education  2021 [employer].

## 2021-01-03 NOTE — Progress Notes (Signed)
Complete physical exam   Patient: Gabriella Marsh   DOB: Sep 22, 1966   55 y.o. Female  MRN: [id number] Visit Date: 01/03/2021  Today's healthcare provider: Mar Daring, PA-C   Chief Complaint  Patient presents with  . Annual Exam  . Diabetes  . Hyperlipidemia  I, M ,acting as a Education administrator for [employer], PA-C.,have documented all relevant documentation on the behalf of Mar Daring, PA-C,as directed by  Mar Daring, PA-C while in the presence of Mar Daring, [place].  Subjective    Gabriella Marsh is a 55 y.o. female who presents today for a complete physical exam.  She reports consuming a general diet. The patient does not participate in regular exercise at present. She generally feels well. She reports sleeping well. She does have additional problems to discuss today.   HPI  Diabetes Mellitus Type II, follow-up  Lab Results  Component Value Date   HGBA1C 8.9 (H) 01/03/2021   HGBA1C 11.2 (A) 10/01/2020   HGBA1C 10.7 (A) 05/22/2020   Last seen for diabetes 3 months ago.  Management since then includes continuing the same treatment. She reports good compliance with treatment. She is not having side effects.   Home blood sugar records: not checking  Episodes of hypoglycemia? No    Current insulin regiment:none Most Recent Eye Exam:   ---------------------------------------------------------------------------------------------------  Lipid/Cholesterol, follow-up  Last Lipid Panel: Lab Results  Component Value Date   CHOL 150 01/03/2021   LDLCALC 64 01/03/2021   HDL 38 (L) 01/03/2021   TRIG 307 (H) 01/03/2021    She was last seen for this 3 months ago.  Management since that visit includes no changes.  She reports good compliance with treatment. She is not having side effects.   Symptoms: No appetite changes No foot ulcerations  No chest pain No chest pressure/discomfort  No dyspnea No  orthopnea  No fatigue No lower extremity edema  No palpitations No paroxysmal nocturnal dyspnea  No nausea No numbness or tingling of extremity  No polydipsia No polyuria  No speech difficulty No syncope   She is following a Regular diet. Current exercise: housecleaning and no regular exercise  Last metabolic panel Lab Results  Component Value Date   GLUCOSE 269 (H) 01/03/2021   NA 134 01/03/2021   K 4.6 01/03/2021   BUN 16 01/03/2021   CREATININE 0.84 01/03/2021   GFRNONAA 80 01/03/2021   GFRAA 92 01/03/2021   CALCIUM 9.8 01/03/2021   AST 31 01/03/2021   ALT 40 (H) 01/03/2021   The 10-year ASCVD risk score Mikey Bussing DC Jr., et al., 2013) is: 2.7%  ---------------------------------------------------------------------------------------------------   Past Medical History:  Diagnosis Date  . Arthritis    knees  . Degenerative disc disease, lumbar   . Diabetes mellitus without complication ([place])   . GERD (gastroesophageal reflux disease)   . Headache    daily - AM (has not been able to have SPG blocks lately)  . Hyperlipidemia   . Hypertension   . Neuropathy    feet  . Vertigo    3-4x/yr   Past Surgical History:  Procedure Laterality Date  . ABDOMINAL HYSTERECTOMY     But still has cervix  . CESAREAN SECTION    . CHOLECYSTECTOMY    . OVARIAN CYST SURGERY    . SHOULDER SURGERY Left 12/26/014   Dr. Little Ishikawa, [facility]  . TONSILLECTOMY AND ADENOIDECTOMY    . [place]  History   Socioeconomic History  . Marital status: Married    Spouse name: Not on file  . Number of children: 1  . Years of education: H/S  . Highest education level: Not on file  Occupational History  . Not on file  Tobacco Use  . Smoking status: Former Smoker    Quit date: 2013    Years since quitting: 9.0  . Smokeless tobacco: Never Used  Vaping Use  . Vaping Use: Never used  Substance and Sexual Activity  . Alcohol use: No    Alcohol/week: 0.0 standard drinks   . Drug use: Never  . Sexual activity: Not on file  Other Topics Concern  . Not on file  Social History Narrative   Right handed   Lives in a single story home with husband and son.   Social Determinants of Health   Financial Resource Strain: Not on file  Food Insecurity: Not on file  Transportation Needs: Not on file  Physical Activity: Not on file  Stress: Not on file  Social Connections: Not on file  Intimate Partner Violence: Not on file   Family Status  Relation Name Status  . Father  Deceased at age 78  . Brother  Alive  . Mother  (Not Specified)  . MGM  (Not Specified)   Family History  Problem Relation Age of Onset  . Diabetes Father   . Heart disease Father   . Hypertension Father   . Hyperlipidemia Father   . Congestive Heart Failure Father   . Healthy Brother   . Osteoporosis Mother   . Irritable bowel syndrome Mother   . Osteoporosis Maternal Grandmother    Allergies  Allergen Reactions  . Celecoxib Shortness Of Breath  . Pregabalin Shortness Of Breath  . Buspar [Buspirone] Other (See Comments)    Tachycardia  . Citalopram Cough    Patient Care Team: Mar Daring, PA-C as PCP - General (Family Medicine) Alda Berthold, DO as Consulting Physician (Neurology)   Medications: Outpatient Medications Prior to Visit  Medication Sig  . atorvastatin (LIPITOR) 20 MG tablet Take 1 tablet (20 mg total) by mouth daily.  . clopidogrel (PLAVIX) 75 MG tablet Take 1 tablet (75 mg total) by mouth daily.  . cyclobenzaprine (FLEXERIL) 5 MG tablet Take 1 tablet (5 mg total) by mouth 3 (three) times daily as needed for muscle spasms.  . Dulaglutide (TRULICITY) 1.5 HY/8.5OY SOPN Inject 1.5 mg into the skin once a week.  . furosemide (LASIX) 20 MG tablet TAKE 2 TABLETS(40 MG) BY MOUTH DAILY AS NEEDED FOR FLUID RETENTION OR SWELLING  . gabapentin (NEURONTIN) 100 MG capsule TAKE 2 CAPSULES THREE TIMES A DAY ALONG WITH 600 MG THREE TIMES A DAY FOR A TOTAL DAILY  DOSE OF 2400 MG DAILY  . gabapentin (NEURONTIN) 600 MG tablet TAKE 1 TABLET(600 MG) BY MOUTH THREE TIMES DAILY  . glipiZIDE (GLUCOTROL) 10 MG tablet TAKE ONE-HALF TABLET BY  MOUTH TWICE DAILY BEFORE  MEALS  . lisinopril (ZESTRIL) 5 MG tablet Take 1 tablet (5 mg total) by mouth daily.  . Melatonin 3 MG TABS Take 1 tablet by mouth at bedtime.  . metFORMIN (GLUMETZA) 1000 MG (MOD) 24 hr tablet Take 1,000 mg by mouth 2 (two) times daily with a meal.  . Multiple Vitamins-Minerals (MULTIVITAMIN ADULT PO) Take by mouth.  . mupirocin ointment (BACTROBAN) 2 % Apply 1 application topically daily.  . nortriptyline (PAMELOR) 25 MG capsule Take 2 capsules (50 mg total) by  mouth at bedtime.  Marland Kitchen omeprazole (PRILOSEC) 20 MG capsule Take 1 capsule (20 mg total) by mouth daily.  . potassium chloride SA (KLOR-CON) 20 MEQ tablet Take 1 tablet (20 mEq total) by mouth daily. Take any time she takes lasix  . traZODone (DESYREL) 50 MG tablet TAKE 1 TO 2 TABLETS(50 TO 100 MG) BY MOUTH AT BEDTIME AS NEEDED FOR SLEEP  . triamterene-hydrochlorothiazide (MAXZIDE-25) 37.5-25 MG tablet Take 1 tablet by mouth daily.  . [DISCONTINUED] oxyCODONE-acetaminophen (PERCOCET/ROXICET) 5-325 MG tablet Take 1 tablet by mouth every 6 (six) hours as needed for severe pain. (Patient taking differently: Take by mouth every 6 (six) hours as needed for severe pain.)   No facility-administered medications prior to visit.    Review of Systems  Constitutional: Negative.   HENT: Negative.   Eyes: Negative.   Respiratory: Negative.   Cardiovascular: Negative.   Gastrointestinal: Negative.   Endocrine: Negative.   Genitourinary: Negative.   Musculoskeletal: Positive for arthralgias, back pain, gait problem and myalgias.  Skin: Negative.   Allergic/Immunologic: Negative.   Neurological: Positive for weakness and numbness.  Hematological: Negative.   Psychiatric/Behavioral: Negative.     Last CBC Lab Results  Component Value Date   WBC  10.6 01/03/2021   HGB 14.7 01/03/2021   HCT 43.5 01/03/2021   MCV 86 01/03/2021   MCH 28.9 01/03/2021   RDW 12.4 01/03/2021   PLT 408 16/06/3709   Last metabolic panel Lab Results  Component Value Date   GLUCOSE 269 (H) 01/03/2021   NA 134 01/03/2021   K 4.6 01/03/2021   CL 92 (L) 01/03/2021   CO2 25 01/03/2021   BUN 16 01/03/2021   CREATININE 0.84 01/03/2021   GFRNONAA 80 01/03/2021   GFRAA 92 01/03/2021   CALCIUM 9.8 01/03/2021   PROT 7.7 01/03/2021   ALBUMIN 4.6 01/03/2021   LABGLOB 3.1 01/03/2021   AGRATIO 1.5 01/03/2021   BILITOT 1.2 01/03/2021   ALKPHOS 96 01/03/2021   AST 31 01/03/2021   ALT 40 (H) 01/03/2021   ANIONGAP 10 09/03/2020      Objective    BP 100/73 (BP Location: Left Arm, Patient Position: Sitting, Cuff Size: Large)   Pulse 97   Temp 97.8 F (36.6 C) (Oral)   Ht 5' 10"  (1.778 m)   Wt 216 lb 6.4 oz (98.2 kg)   SpO2 100%   BMI 31.05 kg/m  BP Readings from Last 3 Encounters:  01/03/21 100/73  10/12/20 (!) 141/80  10/10/20 111/72   Wt Readings from Last 3 Encounters:  01/03/21 216 lb 6.4 oz (98.2 kg)  10/12/20 214 lb (97.1 kg)  10/10/20 214 lb (97.1 kg)      Physical Exam Vitals reviewed.  Constitutional:      General: She is not in acute distress.    Appearance: Normal appearance. She is well-developed and well-nourished. She is obese. She is not ill-appearing or diaphoretic.  HENT:     Head: Normocephalic and atraumatic.     Right Ear: Tympanic membrane, ear canal and external ear normal.     Left Ear: Tympanic membrane, ear canal and external ear normal.  Eyes:     General: No scleral icterus.       Right eye: No discharge.        Left eye: No discharge.     Extraocular Movements: Extraocular movements intact and EOM normal.     Conjunctiva/sclera: Conjunctivae normal.     Pupils: Pupils are equal, round, and reactive to light.  Neck:     Thyroid: No thyromegaly.     Vascular: No carotid bruit or JVD.     Trachea: No  tracheal deviation.  Cardiovascular:     Rate and Rhythm: Normal rate and regular rhythm.     Pulses: Normal pulses and intact distal pulses.     Heart sounds: Normal heart sounds. No murmur heard. No friction rub. No gallop.   Pulmonary:     Effort: Pulmonary effort is normal. No respiratory distress.     Breath sounds: Normal breath sounds. No wheezing or rales.  Chest:     Chest wall: No tenderness.  Abdominal:     General: Abdomen is flat. Bowel sounds are normal. There is no distension.     Palpations: Abdomen is soft. There is no mass.     Tenderness: There is no abdominal tenderness. There is no guarding or rebound.  Musculoskeletal:        General: No tenderness or edema. Normal range of motion.     Cervical back: Normal range of motion and neck supple. No tenderness.     Right lower leg: No edema.     Left lower leg: No edema.  Lymphadenopathy:     Cervical: No cervical adenopathy.  Skin:    General: Skin is warm and dry.     Capillary Refill: Capillary refill takes less than 2 seconds.     Findings: No rash.  Neurological:     General: No focal deficit present.     Mental Status: She is alert and oriented to person, place, and time. Mental status is at baseline.  Psychiatric:        Mood and Affect: Mood and affect and mood normal.        Behavior: Behavior normal.        Thought Content: Thought content normal.        Judgment: Judgment normal.     Diabetic Foot Form - Detailed   Diabetic Foot Exam - detailed Diabetic Foot exam was performed with the following findings: Yes 01/03/2021  3:45 PM  Visual Foot Exam completed.: Yes  Can the patient see the bottom of their feet?: Yes Are the shoes appropriate in style and fit?: Yes Is there swelling or and abnormal foot shape?: Yes (Comment: right foot and ankle) Is there a claw toe deformity?: No Is there elevated skin temparature?: No Is there foot or ankle muscle weakness?: No Normal Range of Motion: Yes Pulse  Foot Exam completed.: Yes  Right posterior Tibialias: Present Left posterior Tibialias: Present  Right Dorsalis Pedis: Present Left Dorsalis Pedis: Present  Sensory Foot Exam Completed.: Yes Semmes-Weinstein Monofilament Test R Site 1-Great Toe: Neg L Site 1-Great Toe: Neg        Last depression screening scores PHQ 2/9 Scores 01/03/2021 04/01/2020 01/23/2019  PHQ - 2 Score 0 2 4  PHQ- 9 Score 0 12 13   Last fall risk screening Fall Risk  01/03/2021  Falls in the past year? 1  Number falls in past yr: 0  Injury with Fall? 1  Risk for fall due to : History of fall(s)  Follow up Falls evaluation completed;Education provided;Falls prevention discussed   Last Audit-C alcohol use screening Alcohol Use Disorder Test (AUDIT) 01/03/2021  1. How often do you have a drink containing alcohol? 0  2. How many drinks containing alcohol do you have on a typical day when you are drinking? 0  3. How often do you have six or more  drinks on one occasion? 0  AUDIT-C Score 0   A score of 3 or more in women, and 4 or more in men indicates increased risk for alcohol abuse, EXCEPT if all of the points are from question 1   Results for orders placed or performed in visit on 01/03/21  CBC w/Diff/Platelet  Result Value Ref Range   WBC 10.6 3.4 - 10.8 x10E3/uL   RBC 5.08 3.77 - 5.28 x10E6/uL   Hemoglobin 14.7 11.1 - 15.9 g/dL   Hematocrit 43.5 34.0 - 46.6 %   MCV 86 79 - 97 fL   MCH 28.9 26.6 - 33.0 pg   MCHC 33.8 31.5 - 35.7 g/dL   RDW 12.4 11.7 - 15.4 %   Platelets 408 150 - 450 x10E3/uL   Neutrophils 50 Not Estab. %   Lymphs 40 Not Estab. %   Monocytes 7 Not Estab. %   Eos 1 Not Estab. %   Basos 1 Not Estab. %   Neutrophils Absolute 5.5 1.4 - 7.0 x10E3/uL   Lymphocytes Absolute 4.2 (H) 0.7 - 3.1 x10E3/uL   Monocytes Absolute 0.7 0.1 - 0.9 x10E3/uL   EOS (ABSOLUTE) 0.1 0.0 - 0.4 x10E3/uL   Basophils Absolute 0.1 0.0 - 0.2 x10E3/uL   Immature Granulocytes 1 Not Estab. %   Immature Grans (Abs)  0.1 0.0 - 0.1 x10E3/uL  Comprehensive Metabolic Panel (CMET)  Result Value Ref Range   Glucose 269 (H) 65 - 99 mg/dL   BUN 16 6 - 24 mg/dL   Creatinine, Ser 0.84 0.57 - 1.00 mg/dL   GFR calc non Af Amer 80 >59 mL/min/1.73   GFR calc Af Amer 92 >59 mL/min/1.73   BUN/Creatinine Ratio 19 9 - 23   Sodium 134 134 - 144 mmol/L   Potassium 4.6 3.5 - 5.2 mmol/L   Chloride 92 (L) 96 - 106 mmol/L   CO2 25 20 - 29 mmol/L   Calcium 9.8 8.7 - 10.2 mg/dL   Total Protein 7.7 6.0 - 8.5 g/dL   Albumin 4.6 3.8 - 4.9 g/dL   Globulin, Total 3.1 1.5 - 4.5 g/dL   Albumin/Globulin Ratio 1.5 1.2 - 2.2   Bilirubin Total 1.2 0.0 - 1.2 mg/dL   Alkaline Phosphatase 96 44 - 121 IU/L   AST 31 0 - 40 IU/L   ALT 40 (H) 0 - 32 IU/L  Lipid Panel With LDL/HDL Ratio  Result Value Ref Range   Cholesterol, Total 150 100 - 199 mg/dL   Triglycerides 307 (H) 0 - 149 mg/dL   HDL 38 (L) >39 mg/dL   VLDL Cholesterol Cal 48 (H) 5 - 40 mg/dL   LDL Chol Calc (NIH) 64 0 - 99 mg/dL   LDL/HDL Ratio 1.7 0.0 - 3.2 ratio  TSH  Result Value Ref Range   TSH 1.750 0.450 - 4.500 uIU/mL  HgB A1c  Result Value Ref Range   Hgb A1c MFr Bld 8.9 (H) 4.8 - 5.6 %   Est. average glucose Bld gHb Est-mCnc 209 mg/dL  Hepatitis C antibody  Result Value Ref Range   Hep C Virus Ab <0.1 0.0 - 0.9 s/co ratio    Assessment & Plan    Routine Health Maintenance and Physical Exam  Exercise Activities and Dietary recommendations Goals   None     Immunization History  Administered Date(s) Administered  . Influenza Split 11/04/2014  . Influenza,inj,Quad PF,6+ Mos 11/01/2015, 11/11/2016, 10/01/2020  . Influenza-Unspecified 10/05/2017, 09/20/2018, 09/22/2019  . PFIZER(Purple Top)SARS-COV-2 Vaccination 03/08/2020, 03/29/2020  .  Pneumococcal Polysaccharide-23 07/25/2012  . Tdap 07/25/2012    Health Maintenance  Topic Date Due  . COLONOSCOPY (Pts 45-62yr Insurance coverage will need to be confirmed)  Never done  . OPHTHALMOLOGY EXAM   02/10/2020  . COVID-19 Vaccine (3 - Booster for Pfizer series) 09/28/2020  . HEMOGLOBIN A1C  07/03/2021  . FOOT EXAM  01/03/2022  . MAMMOGRAM  05/09/2022  . TETANUS/TDAP  07/25/2022  . PAP SMEAR-Modifier  01/21/2023  . INFLUENZA VACCINE  Completed  . PNEUMOCOCCAL POLYSACCHARIDE VACCINE AGE 34-64 HIGH RISK  Completed  . Hepatitis C Screening  Completed  . HIV Screening  Completed    Discussed health benefits of physical activity, and encouraged her to engage in regular exercise appropriate for her age and condition.  1. Annual physical exam Normal physical exam today. Will check labs as below and f/u pending lab results. If labs are stable and WNL she will not need to have these rechecked for one year at her next annual physical exam. She is to call the office in the meantime if she has any acute issue, questions or concerns.  2. Essential hypertension Stable. Continue Lisinopril 583mand Maxzide 37.5-25mg. Will check labs as below and f/u pending results. - CBC w/Diff/Platelet - Comprehensive Metabolic Panel (CMET) - Lipid Panel With LDL/HDL Ratio - TSH - HgB A1c  3. Blue toe syndrome of left lower extremity (HCC) Stable. On Clopidogrel and atorvastatin. Followed by Vascular Surgery. Will check labs as below and f/u pending results. - CBC w/Diff/Platelet - Comprehensive Metabolic Panel (CMET) - Lipid Panel With LDL/HDL Ratio - TSH - HgB A1c  4. Type 2 diabetes mellitus with diabetic neuropathy, without long-term current use of insulin (HCC) Uncontrolled. Continue Trulicity 1.1.3YQQ weekly, glipizide 1046m0.5 tab daily), metformin XR 1000m54mD. On ACE and statin. Will check labs as below and f/u pending results. - CBC w/Diff/Platelet - Comprehensive Metabolic Panel (CMET) - Lipid Panel With LDL/HDL Ratio - TSH - HgB A1c  5. Type 2 diabetes mellitus with microalbuminuria, without long-term current use of insulin (HCC)[place] above medical treatment plan. - CBC  w/Diff/Platelet - Comprehensive Metabolic Panel (CMET) - Lipid Panel With LDL/HDL Ratio - TSH - HgB A1c  6. Diabetic polyneuropathy associated with type 2 diabetes mellitus (HCC)[place] above medical treatment plan. - CBC w/Diff/Platelet - Comprehensive Metabolic Panel (CMET) - Lipid Panel With LDL/HDL Ratio - TSH - HgB A1c  7. Multinodular goiter Stable. Had [place] that showed some of the nodules met criteria for biopsy. She has not had done due to having her mother pass shortly after. Will check labs as below and f/u pending results. Referral placed to Dr. CannC[facility], for consideration of biopsy.  - CBC w/Diff/Platelet - Comprehensive Metabolic Panel (CMET) - Lipid Panel With LDL/HDL Ratio - TSH - HgB A1c  8. Hypercholesteremia On Atorvastatin 20mg62mll check labs as below and f/u pending results. - CBC w/Diff/Platelet - Comprehensive Metabolic Panel (CMET) - Lipid Panel With LDL/HDL Ratio - TSH - HgB A1c  9. Elevated liver function tests H/O this. Diet controlled. Will check labs as below and f/u pending results. - CBC w/Diff/Platelet - Comprehensive Metabolic Panel (CMET) - Lipid Panel With LDL/HDL Ratio - TSH - HgB A1c  10. Encounter for hepatitis C screening test for low risk patient Will check labs as below and f/u pending results. - Hepatitis C Antibody  11. DDD (degenerative disc disease), lumbosacral Stable. Diagnosis pulled for medication refill. Continue current medical treatment  plan. - oxyCODONE-acetaminophen (PERCOCET/ROXICET) 5-325 MG tablet; Take 1 tablet by mouth every 6 (six) hours as needed for severe pain.  Dispense: 120 tablet; Refill: 0  12. Colon cancer screening No previous colonoscopy. Due for colonoscopy. Referral placed.  - Ambulatory referral to Gastroenterology   Return in about 3 months (around 04/03/2021), or if symptoms worsen or fail to improve, for T2DM.     Reynolds Bowl, PA-C, have reviewed all  documentation for this visit. The documentation on 01/07/21 for the exam, diagnosis, procedures, and orders are all accurate and complete.   Rubye Beach  [facility] [phone] (phone) [phone] (fax)  [place]

## 2021-01-04 LAB — LIPID PANEL WITH LDL/HDL RATIO
Cholesterol, Total: 150 mg/dL (ref 100–199)
HDL: 38 mg/dL — ABNORMAL LOW (ref >39)
LDL Chol Calc (NIH): 64 mg/dL (ref 0–99)
LDL/HDL Ratio: 1.7 ratio (ref 0.0–3.2)
Triglycerides: 307 mg/dL — ABNORMAL HIGH (ref 0–149)
VLDL Cholesterol Cal: 48 mg/dL — ABNORMAL HIGH (ref 5–40)

## 2021-01-04 LAB — CBC WITH DIFFERENTIAL/PLATELET
Basophils Absolute: 0.1 10*3/uL (ref 0.0–0.2)
Basos: 1 %
EOS (ABSOLUTE): 0.1 10*3/uL (ref 0.0–0.4)
Eos: 1 %
Hematocrit: 43.5 % (ref 34.0–46.6)
Hemoglobin: 14.7 g/dL (ref 11.1–15.9)
Immature Grans (Abs): 0.1 10*3/uL (ref 0.0–0.1)
Immature Granulocytes: 1 %
Lymphocytes Absolute: 4.2 10*3/uL — ABNORMAL HIGH (ref 0.7–3.1)
Lymphs: 40 %
MCH: 28.9 pg (ref 26.6–33.0)
MCHC: 33.8 g/dL (ref 31.5–35.7)
MCV: 86 fL (ref 79–97)
Monocytes Absolute: 0.7 10*3/uL (ref 0.1–0.9)
Monocytes: 7 %
Neutrophils Absolute: 5.5 10*3/uL (ref 1.4–7.0)
Neutrophils: 50 %
Platelets: 408 10*3/uL (ref 150–450)
RBC: 5.08 x10E6/uL (ref 3.77–5.28)
RDW: 12.4 % (ref 11.7–15.4)
WBC: 10.6 10*3/uL (ref 3.4–10.8)

## 2021-01-04 LAB — HEMOGLOBIN A1C
Est. average glucose Bld gHb Est-mCnc: 209 mg/dL
Hgb A1c MFr Bld: 8.9 % — ABNORMAL HIGH (ref 4.8–5.6)

## 2021-01-04 LAB — COMPREHENSIVE METABOLIC PANEL
ALT: 40 IU/L — ABNORMAL HIGH (ref 0–32)
AST: 31 IU/L (ref 0–40)
Albumin/Globulin Ratio: 1.5 (ref 1.2–2.2)
Albumin: 4.6 g/dL (ref 3.8–4.9)
Alkaline Phosphatase: 96 IU/L (ref 44–121)
BUN/Creatinine Ratio: 19 (ref 9–23)
BUN: 16 mg/dL (ref 6–24)
Bilirubin Total: 1.2 mg/dL (ref 0.0–1.2)
CO2: 25 mmol/L (ref 20–29)
Calcium: 9.8 mg/dL (ref 8.7–10.2)
Chloride: 92 mmol/L — ABNORMAL LOW (ref 96–106)
Creatinine, Ser: 0.84 mg/dL (ref 0.57–1.00)
GFR calc Af Amer: 92 mL/min/{1.73_m2} (ref >59)
GFR calc non Af Amer: 80 mL/min/{1.73_m2} (ref >59)
Globulin, Total: 3.1 g/dL (ref 1.5–4.5)
Glucose: 269 mg/dL — ABNORMAL HIGH (ref 65–99)
Potassium: 4.6 mmol/L (ref 3.5–5.2)
Sodium: 134 mmol/L (ref 134–144)
Total Protein: 7.7 g/dL (ref 6.0–8.5)

## 2021-01-04 LAB — TSH: TSH: 1.75 u[IU]/mL (ref 0.450–4.500)

## 2021-01-04 LAB — HEPATITIS C ANTIBODY: Hep C Virus Ab: <0.1 s/co ratio (ref 0.0–0.9)

## 2021-01-07 ENCOUNTER — Encounter: Payer: Self-pay | Admitting: Physician Assistant

## 2021-01-14 ENCOUNTER — Encounter: Payer: Self-pay | Admitting: Physician Assistant

## 2021-01-20 ENCOUNTER — Other Ambulatory Visit: Payer: Self-pay

## 2021-01-20 ENCOUNTER — Telehealth (INDEPENDENT_AMBULATORY_CARE_PROVIDER_SITE_OTHER): Payer: Self-pay | Admitting: Gastroenterology

## 2021-01-20 DIAGNOSIS — Z1211 Encounter for screening for malignant neoplasm of colon: Secondary | ICD-10-CM

## 2021-01-20 MED ORDER — NA SULFATE-K SULFATE-MG SULF 17.5-3.13-1.6 GM/177ML PO SOLN: 1.0000 | Freq: Once | ORAL | 0 refills | Status: AC

## 2021-01-20 NOTE — Progress Notes (Signed)
Gastroenterology Pre-Procedure Review  Request Date: 02/14/21 Requesting Physician: Dr. Bonna Gains  PATIENT REVIEW QUESTIONS: The patient responded to the following health history questions as indicated:    1. Are you having any GI issues? no 2. Do you have a personal history of Polyps? no 3. Do you have a family history of Colon Cancer or Polyps? yes (yes mother colon polyps) 4. Diabetes Mellitus? yes (type 2) 5. Joint replacements in the past 12 months?no 6. Major health problems in the past 3 months?no 7. Any artificial heart valves, MVP, or defibrillator?no    MEDICATIONS & ALLERGIES:    Patient reports the following regarding taking any anticoagulation/antiplatelet therapy:   Plavix, Coumadin, Eliquis, Xarelto, Lovenox, Pradaxa, Brilinta, or Effient? yes (Clopidogrel prescribed by Dr. Eulogio Ditch) Aspirin? no  Patient confirms/reports the following medications:  Current Outpatient Medications  Medication Sig Dispense Refill  . atorvastatin (LIPITOR) 20 MG tablet Take 1 tablet (20 mg total) by mouth daily. 90 tablet 3  . clopidogrel (PLAVIX) 75 MG tablet Take 1 tablet (75 mg total) by mouth daily. 30 tablet 6  . cyclobenzaprine (FLEXERIL) 5 MG tablet Take 1 tablet (5 mg total) by mouth 3 (three) times daily as needed for muscle spasms. 270 tablet 1  . Dulaglutide (TRULICITY) 1.5 FB/5.1WC SOPN Inject 1.5 mg into the skin once a week. 3 mL 5  . furosemide (LASIX) 20 MG tablet TAKE 2 TABLETS(40 MG) BY MOUTH DAILY AS NEEDED FOR FLUID RETENTION OR SWELLING 180 tablet 3  . gabapentin (NEURONTIN) 100 MG capsule TAKE 2 CAPSULES THREE TIMES A DAY ALONG WITH 600 MG THREE TIMES A DAY FOR A TOTAL DAILY DOSE OF 2400 MG DAILY 360 capsule 3  . gabapentin (NEURONTIN) 600 MG tablet TAKE 1 TABLET(600 MG) BY MOUTH THREE TIMES DAILY 270 tablet 3  . glipiZIDE (GLUCOTROL) 10 MG tablet TAKE ONE-HALF TABLET BY  MOUTH TWICE DAILY BEFORE  MEALS 90 tablet 3  . lisinopril (ZESTRIL) 5 MG tablet Take 1 tablet  (5 mg total) by mouth daily. 90 tablet 3  . Melatonin 3 MG TABS Take 1 tablet by mouth at bedtime.    . metFORMIN (GLUMETZA) 1000 MG (MOD) 24 hr tablet Take 1,000 mg by mouth 2 (two) times daily with a meal.    . nortriptyline (PAMELOR) 25 MG capsule Take 2 capsules (50 mg total) by mouth at bedtime. 180 capsule 3  . omeprazole (PRILOSEC) 20 MG capsule Take 1 capsule (20 mg total) by mouth daily. 90 capsule 3  . oxyCODONE-acetaminophen (PERCOCET/ROXICET) 5-325 MG tablet Take 1 tablet by mouth every 6 (six) hours as needed for severe pain. 120 tablet 0  . potassium chloride SA (KLOR-CON) 20 MEQ tablet Take 1 tablet (20 mEq total) by mouth daily. Take any time she takes lasix 90 tablet 3  . traZODone (DESYREL) 50 MG tablet TAKE 1 TO 2 TABLETS(50 TO 100 MG) BY MOUTH AT BEDTIME AS NEEDED FOR SLEEP 180 tablet 1  . triamterene-hydrochlorothiazide (MAXZIDE-25) 37.5-25 MG tablet Take 1 tablet by mouth daily.    . Multiple Vitamins-Minerals (MULTIVITAMIN ADULT PO) Take by mouth. (Patient not taking: Reported on 01/20/2021)    . mupirocin ointment (BACTROBAN) 2 % Apply 1 application topically daily. (Patient not taking: Reported on 01/20/2021) 22 g 0   No current facility-administered medications for this visit.    Patient confirms/reports the following allergies:  Allergies  Allergen Reactions  . Celecoxib Shortness Of Breath  . Pregabalin Shortness Of Breath  . Buspar [Buspirone] Other (See Comments)  Tachycardia  . Citalopram Cough    No orders of the defined types were placed in this encounter.   AUTHORIZATION INFORMATION Primary Insurance: 1D#: Group #:  Secondary Insurance: 1D#: Group #:  SCHEDULE INFORMATION: Date: 02/14/21 Time: Location:[facility]

## 2021-01-22 ENCOUNTER — Encounter: Payer: Self-pay | Admitting: Physician Assistant

## 2021-01-23 ENCOUNTER — Encounter (INDEPENDENT_AMBULATORY_CARE_PROVIDER_SITE_OTHER): Payer: Self-pay

## 2021-01-23 ENCOUNTER — Other Ambulatory Visit: Payer: Self-pay | Admitting: General Surgery

## 2021-01-23 ENCOUNTER — Telehealth: Payer: Self-pay

## 2021-01-23 ENCOUNTER — Ambulatory Visit: Payer: Self-pay

## 2021-01-23 ENCOUNTER — Ambulatory Visit: Payer: 59 | Admitting: General Surgery

## 2021-01-23 ENCOUNTER — Other Ambulatory Visit: Payer: Self-pay

## 2021-01-23 ENCOUNTER — Encounter: Payer: Self-pay | Admitting: General Surgery

## 2021-01-23 VITALS — BP 126/83 | HR 105 | Temp 98.2°F | Ht 70.0 in | Wt 215.4 lb

## 2021-01-23 DIAGNOSIS — E042 Nontoxic multinodular goiter: Secondary | ICD-10-CM | POA: Diagnosis not present

## 2021-01-23 DIAGNOSIS — D440 Neoplasm of uncertain behavior of thyroid gland: Secondary | ICD-10-CM

## 2021-01-23 DIAGNOSIS — E041 Nontoxic single thyroid nodule: Secondary | ICD-10-CM

## 2021-01-23 NOTE — Patient Instructions (Signed)
We will call you with the results. Please keep the ice pack on the area throughout the day. Please call with any questions or concerns.

## 2021-01-23 NOTE — Progress Notes (Signed)
Patient ID: Gabriella Marsh, female   DOB: 1966/02/09, 55 y.o.   MRN: [id number]  No chief complaint on file.   HPI Gabriella Marsh is a 55 y.o. female.   She has been referred by her primary care provider, Gabriella Malling, PA-C, for consideration of thyroid biopsy.  Several years ago, her primary care provider palpated a goiter on physical examination.  This led to an ultrasound and subsequent biopsy of 2 nodules in the thyroid, both of which were benign.  She had a follow-up ultrasound done about a year ago and the imaging characteristics of the dominant right-sided nodule had changed significantly, suggesting that repeat biopsy is indicated.  The left inferior nodule appeared stable, but a left midpole nodule also had changes to its imaging that suggested biopsy was warranted.  Gabriella Marsh states that she does have difficulty swallowing pills, but denies other significant dysphagia.  No voice changes.  She denies any dyspnea in a supine position or any pressure sensation in her neck while supine.  No heart palpitations or hand tremors.  She does endorse that her hair seems a little bit brittle but otherwise denies any changes in her skin or fingernails.  No diarrhea or constipation.  Weight has been stable.  She does state that she feels subjectively colder since she has been on Plavix.  She has no history of head or neck irradiation or occupational exposure to ionizing radiation.  No family history of thyroid disease or thyroid cancer.   Past Medical History:  Diagnosis Date  . Arthritis    knees  . Degenerative disc disease, lumbar   . Diabetes mellitus without complication ([place])   . GERD (gastroesophageal reflux disease)   . Headache    daily - AM (has not been able to have SPG blocks lately)  . Hyperlipidemia   . Hypertension   . Neuropathy    feet  . Vertigo    3-4x/yr    Past Surgical History:  Procedure Laterality Date  . ABDOMINAL HYSTERECTOMY     But still has  cervix  . CESAREAN SECTION    . CHOLECYSTECTOMY    . OVARIAN CYST SURGERY    . SHOULDER SURGERY Left 12/26/014   Dr. Little Ishikawa, [facility]  . TONSILLECTOMY AND ADENOIDECTOMY    . UTERINE FIBROID SURGERY      Family History  Problem Relation Age of Onset  . Diabetes Father   . Heart disease Father   . Hypertension Father   . Hyperlipidemia Father   . Congestive Heart Failure Father   . Healthy Brother   . Osteoporosis Mother   . Irritable bowel syndrome Mother   . Osteoporosis Maternal Grandmother     Social History Social History   Tobacco Use  . Smoking status: Former Smoker    Quit date: 2013    Years since quitting: 9.0  . Smokeless tobacco: Never Used  Vaping Use  . Vaping Use: Never used  Substance Use Topics  . Alcohol use: No    Alcohol/week: 0.0 standard drinks  . Drug use: Never    Allergies  Allergen Reactions  . Celecoxib Shortness Of Breath  . Pregabalin Shortness Of Breath  . Buspar [Buspirone] Other (See Comments)    Tachycardia  . Citalopram Cough    Current Outpatient Medications  Medication Sig Dispense Refill  . atorvastatin (LIPITOR) 20 MG tablet Take 1 tablet (20 mg total) by mouth daily. 90 tablet 3  . clopidogrel (PLAVIX) 75 MG tablet Take  1 tablet (75 mg total) by mouth daily. 30 tablet 6  . cyclobenzaprine (FLEXERIL) 5 MG tablet Take 1 tablet (5 mg total) by mouth 3 (three) times daily as needed for muscle spasms. 270 tablet 1  . Dulaglutide (TRULICITY) 1.5 WN/0.2VO SOPN Inject 1.5 mg into the skin once a week. 3 mL 5  . furosemide (LASIX) 20 MG tablet TAKE 2 TABLETS(40 MG) BY MOUTH DAILY AS NEEDED FOR FLUID RETENTION OR SWELLING 180 tablet 3  . gabapentin (NEURONTIN) 100 MG capsule TAKE 2 CAPSULES THREE TIMES A DAY ALONG WITH 600 MG THREE TIMES A DAY FOR A TOTAL DAILY DOSE OF 2400 MG DAILY 360 capsule 3  . gabapentin (NEURONTIN) 600 MG tablet TAKE 1 TABLET(600 MG) BY MOUTH THREE TIMES DAILY 270 tablet 3  . glipiZIDE (GLUCOTROL) 10 MG  tablet TAKE ONE-HALF TABLET BY  MOUTH TWICE DAILY BEFORE  MEALS 90 tablet 3  . lisinopril (ZESTRIL) 5 MG tablet Take 1 tablet (5 mg total) by mouth daily. 90 tablet 3  . Melatonin 3 MG TABS Take 1 tablet by mouth at bedtime.    . metFORMIN (GLUMETZA) 1000 MG (MOD) 24 hr tablet Take 1,000 mg by mouth 2 (two) times daily with a meal.    . Multiple Vitamins-Minerals (MULTIVITAMIN ADULT PO) Take by mouth.    . mupirocin ointment (BACTROBAN) 2 % Apply 1 application topically daily. 22 g 0  . nortriptyline (PAMELOR) 25 MG capsule Take 2 capsules (50 mg total) by mouth at bedtime. 180 capsule 3  . omeprazole (PRILOSEC) 20 MG capsule Take 1 capsule (20 mg total) by mouth daily. 90 capsule 3  . oxyCODONE-acetaminophen (PERCOCET/ROXICET) 5-325 MG tablet Take 1 tablet by mouth every 6 (six) hours as needed for severe pain. 120 tablet 0  . potassium chloride SA (KLOR-CON) 20 MEQ tablet Take 1 tablet (20 mEq total) by mouth daily. Take any time she takes lasix 90 tablet 3  . traZODone (DESYREL) 50 MG tablet TAKE 1 TO 2 TABLETS(50 TO 100 MG) BY MOUTH AT BEDTIME AS NEEDED FOR SLEEP 180 tablet 1  . triamterene-hydrochlorothiazide (MAXZIDE-25) 37.5-25 MG tablet Take 1 tablet by mouth daily.     No current facility-administered medications for this visit.    Review of Systems Review of Systems  Eyes: Positive for visual disturbance.  Cardiovascular: Positive for leg swelling.  Neurological: Positive for dizziness, numbness and headaches.       Paresthesias and neuropathic pain  All other systems reviewed and are negative. Or as discussed in the history of present illness.  Blood pressure 126/83, pulse (!) 105, temperature 98.2 F (36.8 C), temperature source Oral, height 5\' 10"  (1.778 m), weight 215 lb 6.4 oz (97.7 kg), SpO2 97 %. Body mass index is 30.91 kg/m.  Physical Exam Physical Exam Constitutional:      General: She is not in acute distress.    Appearance: She is obese.  HENT:     Head:  Normocephalic and atraumatic.     Nose:     Comments: Covered with a mask    Mouth/Throat:     Comments: Covered with a mask Eyes:     General: No scleral icterus.       Left eye: No discharge.     Conjunctiva/sclera: Conjunctivae normal.     Comments: No proptosis or exophthalmos  Neck:     Comments: The trachea is midline.  The thyroid is enlarged bilaterally, right significantly greater than left.  The gland moves freely with deglutition.  No palpable cervical or supraclavicular lymphadenopathy. Cardiovascular:     Rate and Rhythm: Normal rate and regular rhythm.     Comments: Although the vital signs document tachycardia, at the time of my evaluation, her rate was normal. Pulmonary:     Effort: Pulmonary effort is normal. No respiratory distress.     Breath sounds: Normal breath sounds.  Genitourinary:    Comments: Deferred Musculoskeletal:     Comments: Hands are gloved secondary to neuropathy.  She has trace bilateral lower extremity edema and the skin of her lower legs is cool and slightly mottled.  Skin:    General: Skin is dry.     Coloration: Skin is not jaundiced.  Neurological:     General: No focal deficit present.     Mental Status: She is alert.  Psychiatric:        Mood and Affect: Mood normal.        Behavior: Behavior normal.     Data Reviewed Results for EMMABELLE, FEAR (MRN [id number]) as of 01/23/2021 13:30  Ref. Range 10/12/2014 00:00 03/06/2015 09:11 01/21/2018 10:06 01/23/2019 16:09 01/03/2021 16:13  TSH Latest Ref Range: 0.450 - 4.500 uIU/mL 1.19 1.110 1.220 1.530 1.750   Thyroid function has been normal on each occasion documented here.  I personally reviewed the ultrasound imaging from 2016, along with the images of the FNA biopsies that were performed at that time.  I also reviewed the ultrasound performed October 16 2019 and concur with the radiologist impression regarding the changes seen in the right sided nodule as well as the more superior  left-sided nodule.  The radiology impression is copied here:  CLINICAL DATA:  Thyroid nodule follow-up  EXAM: THYROID ULTRASOUND  TECHNIQUE: Ultrasound examination of the thyroid gland and adjacent soft tissues was performed.  COMPARISON:  March 19, 2015  FINDINGS: Parenchymal Echotexture: Moderately heterogenous  Isthmus: 0.9 cm  Right lobe: 5.7 x 2.5 x 2.5 cm  Left lobe: 6 x 2 x 1.9 cm  _________________________________________________________  Estimated total number of nodules >/= 1 cm: 3  Number of spongiform nodules >/=  2 cm not described below (TR1): 0  Number of mixed cystic and solid nodules >/= 1.5 cm not described below (TR2): 0  _________________________________________________________  Nodule # 1:  Prior biopsy: Yes  Location: Right; Mid  Maximum size: 2.8 cm; Other 2 dimensions: 2.3 x 2.2 cm, previously, 2.1 x 1.2 x 1.9 cm  Composition: solid/almost completely solid (2)  Echogenicity: isoechoic (1)  Shape: taller-than-wide (3)  Margins: smooth (0)  Echogenic foci: none (0)  ACR TI-RADS total points: 6.  ACR TI-RADS risk category:  TR4 (4-6 points).  Significant change in size (>/= 20% in two dimensions and minimal increase of 2 mm): Yes  Change in features: Yes  Change in ACR TI-RADS risk category: Yes  ACR TI-RADS recommendations:  **Given size (>/= 1.5 cm) and appearance, fine needle aspiration of this moderately suspicious nodule should be considered based on TI-RADS criteria.  This nodule has changed in appearance as it is mostly solid on today's exam. Previously was mostly cystic.  _________________________________________________________  Nodule # 2:  Prior biopsy: No  Location: Left; Mid  Maximum size: 1.6 cm; Other 2 dimensions: 1.4 x 0.7 cm, previously, 1.1 x 0.6 x 0.8 cm  Composition: solid/almost completely solid (2)  Echogenicity: hypoechoic (2)  Shape: not  taller-than-wide (0)  Margins: smooth (0)  Echogenic foci: none (0)  ACR TI-RADS total points: 4.  ACR TI-RADS risk category:  TR4 (4-6 points).  Significant change in size (>/= 20% in two dimensions and minimal increase of 2 mm): No  Change in features: No  Change in ACR TI-RADS risk category: Yes  ACR TI-RADS recommendations:  **Given size (>/= 1.5 cm) and appearance, fine needle aspiration of this moderately suspicious nodule should be considered based on TI-RADS criteria.  _________________________________________________________  Nodule # 3:  Prior biopsy: Yes  Location: Left; Inferior  Maximum size: 2.1 cm; Other 2 dimensions: 2.1 x 1.8 cm, previously, 2.2 x 1.7 x 2.1 cm  Composition: solid/almost completely solid (2)  Echogenicity: isoechoic (1)  Shape: not taller-than-wide (0)  Margins: ill-defined (0)  Echogenic foci: none (0)  ACR TI-RADS total points: 3.  ACR TI-RADS risk category:  TR3 (3 points).  Significant change in size (>/= 20% in two dimensions and minimal increase of 2 mm): No  Change in features: No  Change in ACR TI-RADS risk category: No  ACR TI-RADS recommendations:  *Given size (>/= 1.5 - 2.4 cm) and appearance, a follow-up ultrasound in 1 year should be considered based on TI-RADS criteria.  _________________________________________________________  IMPRESSION: 1. Enlarged multinodular goiter as detailed above. 2. Dominant right-sided thyroid nodule measuring 2.8 cm, increased in size from prior study when it measured 2.1 cm. Additionally, this nodule is now more solid than cystic and has been upgraded to a TR 4 thyroid nodule and meets criteria for FNA. This nodule was previously biopsied. Given its change in appearance, a repeat biopsy should be considered. 3. A 1.6 cm left mid thyroid. This thyroid nodule now meets criteria for FNA. 4. Stable 2.1 cm left inferior thyroid nodule that was  reportedly previously biopsied. Correlation with prior biopsy results is recommended.  The above is in keeping with the ACR TI-RADS recommendations - J Am Coll Radiol 2017;14:587-595.   Assessment This is a 55 year old woman with a multinodular goiter.  Prior biopsies of a dominant left inferior nodule and right sided thyroid nodule were previously benign, however in the interim since 2016, the imaging characteristics of a midpole left thyroid nodule and the dominant right-sided nodule have changed significantly.  Based upon these changes, fine-needle aspiration biopsy was warranted.  Plan Fine-needle aspiration biopsy was performed in clinic today on both nodules.  I will contact the patient once I have the results.  Procedure Note: Ultrasound-Guided Fine Needle Aspiration Biopsy  Indications:  thyroid nodule (right)  Anesthetic: ethyl chloride spray  Procedure: Under direct sonographic guidance, the target lesion was isolated and sampled using a 25-gauge needle.  A total of 4 passes were made.  The air-dried Diff-Quick stained slides were viewed on-site by the performing surgeon.  ThyroSeq [facility]) was collected. Pressure was held and an ice pack applied. Specimen(s) sent to pathology for evaluation. The patient tolerated the procedure fairly well.   Findings: Thyroid follicular cells seen on the Diff-Quik stained slides.  The slides were very cellular.  Complications: none  Procedure Note: Ultrasound-Guided Fine Needle Aspiration Biopsy  Indications:  thyroid nodule (left)  Anesthetic: ethyl chloride spray  Procedure: Under direct sonographic guidance, the target lesion was isolated and sampled using a 25-gauge needle.  A total of 4 passes were made.  The air-dried Diff-Quick stained slides were viewed on-site by the performing surgeon.  ThyroSeq [facility]) was collected. Pressure was held and an ice pack applied. Specimen(s) sent to pathology for evaluation. The patient tolerated the  procedure fairly well.   Findings: The samples were predominantly cystic, but a few thyroid follicular cells  were appreciated on the third pass.  Due to the cellularity seen on the right sided sample, I elected not to perform additional passes on the left.  Complications: none  Sheppard Evens 01/23/2021, 2:34 PM

## 2021-01-23 NOTE — Telephone Encounter (Signed)
Spoke with Delsa Bern for stat pick up for thyroid /right and left thyroid nodule. Thyroseq paper signed and included.

## 2021-01-27 ENCOUNTER — Telehealth: Payer: Self-pay | Admitting: General Surgery

## 2021-01-27 NOTE — Telephone Encounter (Signed)
Discussed FNAB results with patient. Both right and left were indeterminate ([place] 4 and 3, respectively). ThyroSeq will be sent. I told her I would contact her once I have those results, with is usually in 2-3 weeks.

## 2021-02-11 ENCOUNTER — Telehealth: Payer: Self-pay | Admitting: General Surgery

## 2021-02-11 NOTE — Telephone Encounter (Signed)
Discussed results of molecular diagnostic testing with the patient. The left nodule was negative, but the right nodule has an atrophic mutation that has a roughly 70% risk of being either a follicular pattern and carcinoma or a NIFTP. I discussed the options of surgery as well as their risks and benefits. She is leaning towards total thyroidectomy but would like to discuss this further with her husband. She will call us back when she has made her decision.

## 2021-02-12 ENCOUNTER — Encounter: Payer: Self-pay | Admitting: General Surgery

## 2021-02-12 ENCOUNTER — Other Ambulatory Visit: Payer: Self-pay

## 2021-02-12 ENCOUNTER — Other Ambulatory Visit
Admission: RE | Admit: 2021-02-12 | Discharge: 2021-02-12 | Disposition: A | Discharge status: Home / Self Care | Payer: 59 | Source: Ambulatory Visit | Attending: Gastroenterology | Admitting: Gastroenterology

## 2021-02-12 ENCOUNTER — Encounter: Payer: Self-pay | Admitting: Physician Assistant

## 2021-02-12 DIAGNOSIS — Z01812 Encounter for preprocedural laboratory examination: Secondary | ICD-10-CM | POA: Diagnosis present

## 2021-02-12 DIAGNOSIS — Z20822 Contact with and (suspected) exposure to covid-19: Secondary | ICD-10-CM | POA: Diagnosis not present

## 2021-02-12 LAB — SARS CORONAVIRUS 2 (TAT 6-24 HRS): SARS Coronavirus 2: NEGATIVE

## 2021-02-13 ENCOUNTER — Other Ambulatory Visit: Payer: Self-pay

## 2021-02-13 DIAGNOSIS — M5137 Other intervertebral disc degeneration, lumbosacral region: Secondary | ICD-10-CM

## 2021-02-13 MED ORDER — OXYCODONE-ACETAMINOPHEN 5-325 MG PO TABS: 1.0000 | ORAL_TABLET | Freq: Four times a day (QID) | ORAL | 0 refills | Status: DC | PRN

## 2021-02-14 ENCOUNTER — Encounter (INDEPENDENT_AMBULATORY_CARE_PROVIDER_SITE_OTHER): Payer: Self-pay

## 2021-02-14 ENCOUNTER — Other Ambulatory Visit: Payer: Self-pay

## 2021-02-14 ENCOUNTER — Ambulatory Visit: Payer: 59 | Admitting: Certified Registered Nurse Anesthetist

## 2021-02-14 ENCOUNTER — Encounter
Admission: RE | Disposition: A | Discharge status: Home / Self Care | Payer: Self-pay | Source: Home / Self Care | Attending: Gastroenterology

## 2021-02-14 ENCOUNTER — Telehealth: Payer: Self-pay | Admitting: General Surgery

## 2021-02-14 ENCOUNTER — Encounter: Payer: Self-pay | Admitting: Gastroenterology

## 2021-02-14 ENCOUNTER — Ambulatory Visit
Admission: RE | Admit: 2021-02-14 | Discharge: 2021-02-14 | Disposition: A | Discharge status: Home / Self Care | Payer: 59 | Attending: Gastroenterology | Admitting: Gastroenterology

## 2021-02-14 DIAGNOSIS — Z5309 Procedure and treatment not carried out because of other contraindication: Secondary | ICD-10-CM | POA: Diagnosis not present

## 2021-02-14 DIAGNOSIS — Z87891 Personal history of nicotine dependence: Secondary | ICD-10-CM | POA: Diagnosis not present

## 2021-02-14 DIAGNOSIS — Z1211 Encounter for screening for malignant neoplasm of colon: Secondary | ICD-10-CM | POA: Diagnosis present

## 2021-02-14 LAB — GLUCOSE, CAPILLARY: Glucose-Capillary: 228 mg/dL — ABNORMAL HIGH (ref 70–99)

## 2021-02-14 SURGERY — COLONOSCOPY WITH PROPOFOL
Anesthesia: General

## 2021-02-14 MED ORDER — SODIUM CHLORIDE 0.9 % IV SOLN: INTRAVENOUS | Status: DC

## 2021-02-14 MED ORDER — PROPOFOL 500 MG/50ML IV EMUL
INTRAVENOUS | Status: AC
  Filled 2021-02-14: qty 50

## 2021-02-14 MED ORDER — LIDOCAINE HCL (PF) 2 % IJ SOLN
INTRAMUSCULAR | Status: AC
  Filled 2021-02-14: qty 5

## 2021-02-14 NOTE — Telephone Encounter (Signed)
Left message for patient to call me so that we can discuss surgery dates.

## 2021-02-14 NOTE — Progress Notes (Signed)
Procedure cancelled due to not holding plavix long enough.

## 2021-02-14 NOTE — Anesthesia Preprocedure Evaluation (Signed)
Anesthesia Evaluation  Patient identified by MRN, date of birth, ID band Patient awake    Reviewed: Allergy & Precautions, NPO status , Patient's Chart, lab work & pertinent test results  Airway Mallampati: III  TM Distance: <3 FB     Dental   Pulmonary former smoker,    Pulmonary exam normal        Cardiovascular hypertension, + Peripheral Vascular Disease  Normal cardiovascular exam     Neuro/Psych  Headaches, PSYCHIATRIC DISORDERS Anxiety  Neuromuscular disease    GI/Hepatic Neg liver ROS, GERD  ,  Endo/Other  diabetes  Renal/GU negative Renal ROS  negative genitourinary   Musculoskeletal  (+) Arthritis , Osteoarthritis,    Abdominal Normal abdominal exam  (+)   Peds negative pediatric ROS (+)  Hematology negative hematology ROS (+)   Anesthesia Other Findings Past Medical History: No date: Arthritis     Comment:  knees No date: Degenerative disc disease, lumbar No date: Diabetes mellitus without complication ([facility]) No date: GERD (gastroesophageal reflux disease) No date: Headache     Comment:  daily - AM (has not been able to have SPG blocks lately) No date: Hyperlipidemia No date: Hypertension No date: Neuropathy     Comment:  feet No date: Vertigo     Comment:  3-4x/yr  Reproductive/Obstetrics                             Anesthesia Physical Anesthesia Plan  ASA: III  Anesthesia Plan: General   Post-op Pain Management:    Induction: Intravenous  PONV Risk Score and Plan: Propofol infusion  Airway Management Planned: Nasal Cannula  Additional Equipment:   Intra-op Plan:   Post-operative Plan:   Informed Consent: I have reviewed the patients History and Physical, chart, labs and discussed the procedure including the risks, benefits and alternatives for the proposed anesthesia with the patient or authorized representative who has indicated his/her understanding and  acceptance.     Dental advisory given  Plan Discussed with: CRNA and Surgeon  Anesthesia Plan Comments:         Anesthesia Quick Evaluation

## 2021-02-14 NOTE — H&P (Addendum)
Pt took plavix on 2/23. Today is 2/25. Therefore procedure was rescheduled

## 2021-02-14 NOTE — Progress Notes (Signed)
Request for medical clearance for surgery on 03/19/21 for Thyroid has been faxed to [facility] office.

## 2021-02-14 NOTE — Progress Notes (Signed)
Request for clearance to hold Plavix prior to upcoming surgery on 03/19/21 has been faxed to Eulogio Ditch, NP at [place].

## 2021-02-17 ENCOUNTER — Telehealth: Payer: Self-pay | Admitting: General Surgery

## 2021-02-17 ENCOUNTER — Encounter: Payer: Self-pay | Admitting: Physician Assistant

## 2021-02-17 NOTE — Telephone Encounter (Signed)
Outgoing call is made, left message for patient to call.  Please advise patient of Pre-Admission date/time, COVID Testing date and Surgery date.  Surgery Date: 03/19/21 Preadmission Testing Date: 03/14/21 (phone 8a-1p) Covid Testing Date: 03/17/21 - [facility] [place] [facility] between 8a-1p  Also patient to call [phone], between 1-3:00pm the day before surgery, to find out what time to arrive for surgery.

## 2021-02-21 NOTE — Telephone Encounter (Signed)
Outgoing call is made, patient is now aware of all dates regarding her surgery and voices understanding.

## 2021-02-25 ENCOUNTER — Telehealth: Payer: Self-pay

## 2021-02-25 NOTE — Telephone Encounter (Signed)
Patient notified per Eulogio Ditch, NP that she is to hold her Plavix 5 days prior to her surgery scheduled for 03/19/21. She may restart the Plavix 2 days after her surgery.

## 2021-02-25 NOTE — Progress Notes (Signed)
Received anticoagulation clearance from Eulogio Ditch, NP. The patient will hold her Plavix 5 days prior to the surgery and may resume her Plavix 2 days after her surgery. The patient has been notified.

## 2021-03-05 ENCOUNTER — Ambulatory Visit (INDEPENDENT_AMBULATORY_CARE_PROVIDER_SITE_OTHER): Payer: 59 | Admitting: Physician Assistant

## 2021-03-05 ENCOUNTER — Other Ambulatory Visit: Payer: Self-pay

## 2021-03-05 VITALS — BP 94/51 | HR 106 | Temp 98.4°F | Wt 220.0 lb

## 2021-03-05 DIAGNOSIS — Z01818 Encounter for other preprocedural examination: Secondary | ICD-10-CM

## 2021-03-05 NOTE — Progress Notes (Signed)
Established patient visit   Patient: Gabriella Marsh   DOB: Jan 01, 1966   55 y.o. Female  MRN: [id number] Visit Date: 03/05/2021  Today's healthcare provider: Mar Daring, PA-C   No chief complaint on file.  Subjective    HPI  Patient is a 55 year old female who presents for surgical clearance.  She is scheduled for thyroidectomy on 03/19/21 by Dr. Fredirick Maudlin. Does report a progression of her neuropathy, but otherwise feels well.     Medications: Outpatient Medications Prior to Visit  Medication Sig  . atorvastatin (LIPITOR) 20 MG tablet Take 1 tablet (20 mg total) by mouth daily.  . clopidogrel (PLAVIX) 75 MG tablet Take 1 tablet (75 mg total) by mouth daily.  . cyclobenzaprine (FLEXERIL) 5 MG tablet Take 1 tablet (5 mg total) by mouth 3 (three) times daily as needed for muscle spasms.  . Dulaglutide (TRULICITY) 1.5 NT/7.0YF SOPN Inject 1.5 mg into the skin once a week.  . furosemide (LASIX) 20 MG tablet TAKE 2 TABLETS(40 MG) BY MOUTH DAILY AS NEEDED FOR FLUID RETENTION OR SWELLING (Patient taking differently: Take 40 mg by mouth daily.)  . gabapentin (NEURONTIN) 100 MG capsule TAKE 2 CAPSULES THREE TIMES A DAY ALONG WITH 600 MG THREE TIMES A DAY FOR A TOTAL DAILY DOSE OF 2400 MG DAILY (Patient taking differently: Take 200 mg by mouth 3 (three) times daily. TAKE 2 CAPSULES THREE TIMES A DAY ALONG WITH 600 MG THREE TIMES A DAY FOR A TOTAL DAILY DOSE OF 2400 MG DAILY)  . gabapentin (NEURONTIN) 600 MG tablet TAKE 1 TABLET(600 MG) BY MOUTH THREE TIMES DAILY  . glipiZIDE (GLUCOTROL) 10 MG tablet TAKE ONE-HALF TABLET BY  MOUTH TWICE DAILY BEFORE  MEALS (Patient taking differently: Take 10 mg by mouth 2 (two) times daily before a meal.)  . lisinopril (ZESTRIL) 5 MG tablet Take 1 tablet (5 mg total) by mouth daily.  . Melatonin 10 MG TABS Take 10 mg by mouth at bedtime.  . metFORMIN (GLUCOPHAGE) 500 MG tablet Take 1,000 mg by mouth 2 (two) times daily with a meal.  .  omeprazole (PRILOSEC) 20 MG capsule Take 1 capsule (20 mg total) by mouth daily.  Marland Kitchen oxyCODONE-acetaminophen (PERCOCET/ROXICET) 5-325 MG tablet Take 1 tablet by mouth every 6 (six) hours as needed for severe pain.  . potassium chloride SA (KLOR-CON) 20 MEQ tablet Take 1 tablet (20 mEq total) by mouth daily. Take any time she takes lasix  . traZODone (DESYREL) 50 MG tablet TAKE 1 TO 2 TABLETS(50 TO 100 MG) BY MOUTH AT BEDTIME AS NEEDED FOR SLEEP (Patient taking differently: Take 50 mg by mouth at bedtime.)  . triamterene-hydrochlorothiazide (MAXZIDE-25) 37.5-25 MG tablet Take 1 tablet by mouth daily.  . [DISCONTINUED] nortriptyline (PAMELOR) 25 MG capsule Take 2 capsules (50 mg total) by mouth at bedtime.  . Potassium 75 MG TABS Take 150 mg by mouth daily. (Patient not taking: Reported on 03/05/2021)   No facility-administered medications prior to visit.    Review of Systems  Constitutional: Negative.   Respiratory: Negative for cough, shortness of breath and wheezing.   Cardiovascular: Negative for chest pain, palpitations and leg swelling.  Neurological: Positive for weakness and numbness.  Psychiatric/Behavioral: Negative.         Objective    BP (!) 94/51 (BP Location: Left Arm, Patient Position: Sitting, Cuff Size: Large)   Pulse (!) 106   Temp 98.4 F (36.9 C) (Oral)   Wt 220 lb (  99.8 kg)   SpO2 96%   BMI 31.57 kg/m      Physical Exam Vitals reviewed.  Constitutional:      General: She is not in acute distress.    Appearance: Normal appearance. She is well-developed. She is obese. She is not ill-appearing or diaphoretic.  HENT:     Head: Normocephalic and atraumatic.     Right Ear: Tympanic membrane, ear canal and external ear normal.     Left Ear: Tympanic membrane, ear canal and external ear normal.     Nose: Nose normal.     Mouth/Throat:     Mouth: Mucous membranes are moist.     Pharynx: Oropharynx is clear. No oropharyngeal exudate or posterior oropharyngeal  erythema.  Eyes:     General: No scleral icterus.       Right eye: No discharge.        Left eye: No discharge.     Extraocular Movements: Extraocular movements intact.     Conjunctiva/sclera: Conjunctivae normal.     Pupils: Pupils are equal, round, and reactive to light.  Neck:     Thyroid: No thyromegaly.     Vascular: No JVD.     Trachea: No tracheal deviation.  Cardiovascular:     Rate and Rhythm: Normal rate and regular rhythm.     Pulses: Normal pulses.     Heart sounds: Normal heart sounds. No murmur heard. No friction rub. No gallop.   Pulmonary:     Effort: Pulmonary effort is normal. No respiratory distress.     Breath sounds: Normal breath sounds. No wheezing or rales.  Chest:     Chest wall: No tenderness.  Abdominal:     General: Abdomen is flat. Bowel sounds are normal. There is no distension.     Palpations: Abdomen is soft. There is no mass.     Tenderness: There is no abdominal tenderness. There is no guarding or rebound.  Musculoskeletal:        General: No tenderness. Normal range of motion.     Cervical back: Normal range of motion and neck supple. No tenderness.     Right lower leg: No edema.     Left lower leg: No edema.  Lymphadenopathy:     Cervical: No cervical adenopathy.  Skin:    General: Skin is warm and dry.     Capillary Refill: Capillary refill takes less than 2 seconds.     Findings: No rash.  Neurological:     Mental Status: She is alert and oriented to person, place, and time.     Sensory: Sensory deficit present.  Psychiatric:        Mood and Affect: Mood normal.        Behavior: Behavior normal.        Thought Content: Thought content normal.        Judgment: Judgment normal.       Results for orders placed or performed in visit on 03/05/21  CBC w/Diff/Platelet  Result Value Ref Range   WBC 9.6 3.4 - 10.8 x10E3/uL   RBC 5.12 3.77 - 5.28 x10E6/uL   Hemoglobin 14.4 11.1 - 15.9 g/dL   Hematocrit 43.1 34.0 - 46.6 %   MCV 84 79  - 97 fL   MCH 28.1 26.6 - 33.0 pg   MCHC 33.4 31.5 - 35.7 g/dL   RDW 11.8 11.7 - 15.4 %   Platelets 394 150 - 450 x10E3/uL   Neutrophils 58 Not Estab. %  Lymphs 31 Not Estab. %   Monocytes 8 Not Estab. %   Eos 1 Not Estab. %   Basos 1 Not Estab. %   Neutrophils Absolute 5.7 1.4 - 7.0 x10E3/uL   Lymphocytes Absolute 3.0 0.7 - 3.1 x10E3/uL   Monocytes Absolute 0.7 0.1 - 0.9 x10E3/uL   EOS (ABSOLUTE) 0.1 0.0 - 0.4 x10E3/uL   Basophils Absolute 0.1 0.0 - 0.2 x10E3/uL   Immature Granulocytes 1 Not Estab. %   Immature Grans (Abs) 0.1 0.0 - 0.1 x10E3/uL  Comprehensive Metabolic Panel (CMET)  Result Value Ref Range   Glucose 261 (H) 65 - 99 mg/dL   BUN 14 6 - 24 mg/dL   Creatinine, Ser 0.62 0.57 - 1.00 mg/dL   eGFR 106 >59 mL/min/1.73   BUN/Creatinine Ratio 23 9 - 23   Sodium 136 134 - 144 mmol/L   Potassium 5.3 (H) 3.5 - 5.2 mmol/L   Chloride 91 (L) 96 - 106 mmol/L   CO2 24 20 - 29 mmol/L   Calcium 10.1 8.7 - 10.2 mg/dL   Total Protein 7.6 6.0 - 8.5 g/dL   Albumin 4.4 3.8 - 4.9 g/dL   Globulin, Total 3.2 1.5 - 4.5 g/dL   Albumin/Globulin Ratio 1.4 1.2 - 2.2   Bilirubin Total 0.7 0.0 - 1.2 mg/dL   Alkaline Phosphatase 104 44 - 121 IU/L   AST 28 0 - 40 IU/L   ALT 39 (H) 0 - 32 IU/L  HgB A1c  Result Value Ref Range   Hgb A1c MFr Bld 9.4 (H) 4.8 - 5.6 %   Est. average glucose Bld gHb Est-mCnc 223 mg/dL  PT and PTT  Result Value Ref Range   INR 0.9 0.9 - 1.2   Prothrombin Time 9.4 9.1 - 12.0 sec   aPTT 26 24 - 33 sec    Assessment & Plan     1. Pre-op exam EKG today shows sinus tachycardia, rate 101, with low voltage in precordial leads secondary to pulmonary disease. This is stable and essentially unchanged from previous EKG readings. Labs obtained as below. A1c is uncontrolled and increased to 9.4. I have sent a message to Dr. Celine Ahr about this to see if she wants patient to start insulin through surgery date to optimize the best we can. Otherwise patient would be considered  a low to moderate risk for surgery just due to comorbidities and severe neuropathy.  - EKG 12-Lead - CBC w/Diff/Platelet - Comprehensive Metabolic Panel (CMET) - HgB A1c - PT and PTT   No follow-ups on file.      Reynolds Bowl, PA-C, have reviewed all documentation for this visit. The documentation on 03/09/21 for the exam, diagnosis, procedures, and orders are all accurate and complete.   Rubye Beach  [facility] [phone] (phone) [phone] (fax)  [place]

## 2021-03-06 LAB — CBC WITH DIFFERENTIAL/PLATELET
Basophils Absolute: 0.1 10*3/uL (ref 0.0–0.2)
Basos: 1 %
EOS (ABSOLUTE): 0.1 10*3/uL (ref 0.0–0.4)
Eos: 1 %
Hematocrit: 43.1 % (ref 34.0–46.6)
Hemoglobin: 14.4 g/dL (ref 11.1–15.9)
Immature Grans (Abs): 0.1 10*3/uL (ref 0.0–0.1)
Immature Granulocytes: 1 %
Lymphocytes Absolute: 3 10*3/uL (ref 0.7–3.1)
Lymphs: 31 %
MCH: 28.1 pg (ref 26.6–33.0)
MCHC: 33.4 g/dL (ref 31.5–35.7)
MCV: 84 fL (ref 79–97)
Monocytes Absolute: 0.7 10*3/uL (ref 0.1–0.9)
Monocytes: 8 %
Neutrophils Absolute: 5.7 10*3/uL (ref 1.4–7.0)
Neutrophils: 58 %
Platelets: 394 10*3/uL (ref 150–450)
RBC: 5.12 x10E6/uL (ref 3.77–5.28)
RDW: 11.8 % (ref 11.7–15.4)
WBC: 9.6 10*3/uL (ref 3.4–10.8)

## 2021-03-06 LAB — COMPREHENSIVE METABOLIC PANEL
ALT: 39 IU/L — ABNORMAL HIGH (ref 0–32)
AST: 28 IU/L (ref 0–40)
Albumin/Globulin Ratio: 1.4 (ref 1.2–2.2)
Albumin: 4.4 g/dL (ref 3.8–4.9)
Alkaline Phosphatase: 104 IU/L (ref 44–121)
BUN/Creatinine Ratio: 23 (ref 9–23)
BUN: 14 mg/dL (ref 6–24)
Bilirubin Total: 0.7 mg/dL (ref 0.0–1.2)
CO2: 24 mmol/L (ref 20–29)
Calcium: 10.1 mg/dL (ref 8.7–10.2)
Chloride: 91 mmol/L — ABNORMAL LOW (ref 96–106)
Creatinine, Ser: 0.62 mg/dL (ref 0.57–1.00)
Globulin, Total: 3.2 g/dL (ref 1.5–4.5)
Glucose: 261 mg/dL — ABNORMAL HIGH (ref 65–99)
Potassium: 5.3 mmol/L — ABNORMAL HIGH (ref 3.5–5.2)
Sodium: 136 mmol/L (ref 134–144)
Total Protein: 7.6 g/dL (ref 6.0–8.5)
eGFR: 106 mL/min/{1.73_m2} (ref >59)

## 2021-03-06 LAB — HEMOGLOBIN A1C
Est. average glucose Bld gHb Est-mCnc: 223 mg/dL
Hgb A1c MFr Bld: 9.4 % — ABNORMAL HIGH (ref 4.8–5.6)

## 2021-03-06 LAB — PT AND PTT
INR: 0.9 (ref 0.9–1.2)
Prothrombin Time: 9.4 s (ref 9.1–12.0)
aPTT: 26 s (ref 24–33)

## 2021-03-07 ENCOUNTER — Encounter: Payer: Self-pay | Admitting: General Surgery

## 2021-03-07 ENCOUNTER — Other Ambulatory Visit: Payer: Self-pay | Admitting: Physician Assistant

## 2021-03-07 DIAGNOSIS — G2581 Restless legs syndrome: Secondary | ICD-10-CM

## 2021-03-09 ENCOUNTER — Encounter: Payer: Self-pay | Admitting: Physician Assistant

## 2021-03-10 ENCOUNTER — Encounter: Payer: Self-pay | Admitting: General Surgery

## 2021-03-10 ENCOUNTER — Telehealth: Payer: Self-pay

## 2021-03-10 ENCOUNTER — Encounter: Payer: Self-pay | Admitting: Physician Assistant

## 2021-03-10 NOTE — Telephone Encounter (Signed)
Spoke with patient -black spot on toe should not delay surgery.

## 2021-03-11 ENCOUNTER — Telehealth: Payer: Self-pay | Admitting: General Surgery

## 2021-03-11 NOTE — Telephone Encounter (Signed)
Updated information regarding rescheduled surgery.  Patient has been advised of Pre-Admission date/time, COVID Testing date and Surgery date.  Surgery Date: 03/28/21 Preadmission Testing Date: 03/14/21 (phone done) Covid Testing Date: 03/26/21 in person @ 9:10 am - patient advised to go to the [facility] ([place])   Patient has been made aware to call [phone], between 1-3:00pm the day before surgery, to find out what time to arrive for surgery.

## 2021-03-12 ENCOUNTER — Telehealth: Payer: Self-pay

## 2021-03-12 ENCOUNTER — Encounter: Payer: Self-pay | Admitting: Physician Assistant

## 2021-03-12 DIAGNOSIS — M5137 Other intervertebral disc degeneration, lumbosacral region: Secondary | ICD-10-CM

## 2021-03-12 MED ORDER — OXYCODONE-ACETAMINOPHEN 5-325 MG PO TABS: 1.0000 | ORAL_TABLET | Freq: Four times a day (QID) | ORAL | 0 refills | Status: DC | PRN

## 2021-03-12 NOTE — Telephone Encounter (Signed)
Copied from [place] [id number]. Topic: General - Inquiry >> Mar 12, 2021  8:54 AM Oneta Rack wrote: Caller name: Shelia  Relation to pt: [facility]  Call back number: [phone] fax # [phone]   Reason for call:  Checking on the status of Medical Clearance form faxed on 02/20/2021. Patient surgery is March 30th and time is sensitive. Re faxing again to [phone], please note when received.

## 2021-03-12 NOTE — Progress Notes (Signed)
Medical Clearance has been received from [facility]. The patient is cleared at Medium risk for surgery. Clearance has been faxed to Pre Admit testing.

## 2021-03-13 ENCOUNTER — Encounter: Payer: Self-pay | Admitting: Physician Assistant

## 2021-03-13 NOTE — Telephone Encounter (Signed)
Check faxes. It was given to either a medical assistant or front desk staff to fax the day after her appt.

## 2021-03-13 NOTE — Telephone Encounter (Signed)
Gabriella Sat, do you have this form? Patient saw you for a surgical clearance on 03/05/21.

## 2021-03-14 ENCOUNTER — Inpatient Hospital Stay: Admit: 2021-03-14 | Payer: 59

## 2021-03-17 ENCOUNTER — Other Ambulatory Visit: Payer: 59

## 2021-03-17 ENCOUNTER — Encounter: Admission: RE | Payer: Self-pay | Source: Home / Self Care

## 2021-03-17 ENCOUNTER — Ambulatory Visit: Admission: RE | Admit: 2021-03-17 | Payer: 59 | Source: Home / Self Care | Admitting: General Surgery

## 2021-03-17 SURGERY — THYROIDECTOMY
Anesthesia: General

## 2021-03-22 ENCOUNTER — Encounter: Payer: Self-pay | Admitting: Physician Assistant

## 2021-03-26 ENCOUNTER — Other Ambulatory Visit
Admission: RE | Admit: 2021-03-26 | Discharge: 2021-03-26 | Disposition: A | Discharge status: Home / Self Care | Payer: 59 | Source: Ambulatory Visit | Attending: General Surgery | Admitting: General Surgery

## 2021-03-26 ENCOUNTER — Other Ambulatory Visit: Payer: Self-pay

## 2021-03-26 DIAGNOSIS — Z01812 Encounter for preprocedural laboratory examination: Secondary | ICD-10-CM | POA: Diagnosis present

## 2021-03-26 DIAGNOSIS — Z20822 Contact with and (suspected) exposure to covid-19: Secondary | ICD-10-CM | POA: Insufficient documentation

## 2021-03-26 LAB — SARS CORONAVIRUS 2 (TAT 6-24 HRS): SARS Coronavirus 2: NEGATIVE

## 2021-03-27 ENCOUNTER — Other Ambulatory Visit
Admission: RE | Admit: 2021-03-27 | Discharge: 2021-03-27 | Disposition: A | Discharge status: Home / Self Care | Payer: 59 | Source: Ambulatory Visit | Attending: General Surgery | Admitting: General Surgery

## 2021-03-27 ENCOUNTER — Other Ambulatory Visit: Payer: Self-pay | Admitting: General Surgery

## 2021-03-27 DIAGNOSIS — E041 Nontoxic single thyroid nodule: Secondary | ICD-10-CM

## 2021-03-27 HISTORY — DX: Coagulation defect, unspecified: D68.9

## 2021-03-27 NOTE — Patient Instructions (Signed)
Your procedure is scheduled on: Friday March 28, 2021. Report to Day Surgery inside [facility] 2nd floor (stop by admissions desk first before getting on elevator). To find out your arrival time please call [phone] between 1PM - 3PM on Thursday March 27, 2021.  Remember: Instructions that are not followed completely may result in serious medical risk,  up to and including death, or upon the discretion of your surgeon and anesthesiologist your  surgery may need to be rescheduled.     _X__ 1. Do not eat food after midnight the night before your procedure.                 No chewing gum or hard candies. You may drink clear liquids up to 2 hours                 before you are scheduled to arrive for your surgery- DO not drink clear                 liquids within 2 hours of the start of your surgery.                 Clear Liquids include:  water, G2 or                  Gatorade Zero (avoid Red/Purple/Blue), Black Coffee or Tea (Do not add                 anything to coffee or tea).  __X__2.  On the morning of surgery brush your teeth with toothpaste and water, you                may rinse your mouth with mouthwash if you wish.  Do not swallow any toothpaste of mouthwash.     _X__ 3.  No Alcohol for 24 hours before or after surgery.   _X__ 4.  Do Not Smoke or use e-cigarettes For 24 Hours Prior to Your Surgery.                 Do not use any chewable tobacco products for at least 6 hours prior to                 Surgery.  _X__  5.  Do not use any recreational drugs (marijuana, cocaine, heroin, ecstasy, MDMA or other)                For at least one week prior to your surgery.  Combination of these drugs with anesthesia                May have life threatening results.  __X__6.  Notify your doctor if there is any change in your medical condition      (cold, fever, infections).     Do not wear jewelry, make-up, hairpins, clips or nail polish. Do not wear  lotions, powders, or perfumes. You may wear deodorant. Do not shave 48 hours prior to surgery. Men may shave face and neck. Do not bring valuables to the hospital.    [facility] is not responsible for any belongings or valuables.  Contacts, dentures or bridgework may not be worn into surgery. Leave your suitcase in the car. After surgery it may be brought to your room. For patients admitted to the hospital, discharge time is determined by your treatment team.   Patients discharged the day of surgery will not be allowed to drive home.   Make arrangements for someone  to be with you for the first 24 hours of your Same Day Discharge.    __X__ Take these medicines the morning of surgery with A SIP OF WATER:    1. gabapentin (NEURONTIN) 800 MG   2. omeprazole (PRILOSEC) 20 MG  3.   4.  5.  6.  ____ Fleet Enema (as directed)   __X__ Use Antibacterial Soap (or wipes) as directed  ____ Use Benzoyl Peroxide Gel as instructed  ____ Use inhalers on the day of surgery  __X__ Stop metformin 2 days prior to surgery    __X__ Take 1/2 of usual insulin dose the night before surgery. No insulin the morning          of surgery. insulin degludec (TRESIBA FLEXTOUCH) 100 UNIT/ML  __X__ Stop clopidogrel (PLAVIX) 75 MG as instructed by your provider.  __X__ Stop Anti-inflammatories such as Ibuprofen, Aleve, Advil, Motrin, naproxen, aspirin, Goody's or BC powders.    __X__ Stop supplements until after surgery.    __X__ Do not start any herbal supplements before your procedure.    If you have any questions regarding your pre-procedure instructions,  Please call Pre-admit Testing at [phone].

## 2021-03-28 ENCOUNTER — Other Ambulatory Visit: Payer: Self-pay

## 2021-03-28 ENCOUNTER — Encounter
Admission: RE | Disposition: A | Discharge status: Home / Self Care | Payer: Self-pay | Source: Home / Self Care | Attending: General Surgery

## 2021-03-28 ENCOUNTER — Ambulatory Visit: Payer: 59 | Admitting: Urgent Care

## 2021-03-28 ENCOUNTER — Encounter: Payer: Self-pay | Admitting: General Surgery

## 2021-03-28 ENCOUNTER — Observation Stay
Admission: RE | Admit: 2021-03-28 | Discharge: 2021-03-29 | Disposition: A | Discharge status: Home / Self Care | Payer: 59 | Attending: General Surgery | Admitting: General Surgery

## 2021-03-28 DIAGNOSIS — E890 Postprocedural hypothyroidism: Secondary | ICD-10-CM

## 2021-03-28 DIAGNOSIS — Z7984 Long term (current) use of oral hypoglycemic drugs: Secondary | ICD-10-CM | POA: Insufficient documentation

## 2021-03-28 DIAGNOSIS — I10 Essential (primary) hypertension: Secondary | ICD-10-CM | POA: Insufficient documentation

## 2021-03-28 DIAGNOSIS — E042 Nontoxic multinodular goiter: Secondary | ICD-10-CM | POA: Diagnosis not present

## 2021-03-28 DIAGNOSIS — Z87891 Personal history of nicotine dependence: Secondary | ICD-10-CM | POA: Insufficient documentation

## 2021-03-28 DIAGNOSIS — E119 Type 2 diabetes mellitus without complications: Secondary | ICD-10-CM | POA: Diagnosis not present

## 2021-03-28 DIAGNOSIS — Z79899 Other long term (current) drug therapy: Secondary | ICD-10-CM | POA: Diagnosis not present

## 2021-03-28 DIAGNOSIS — Z7902 Long term (current) use of antithrombotics/antiplatelets: Secondary | ICD-10-CM | POA: Diagnosis not present

## 2021-03-28 DIAGNOSIS — Z9089 Acquired absence of other organs: Secondary | ICD-10-CM

## 2021-03-28 DIAGNOSIS — E041 Nontoxic single thyroid nodule: Secondary | ICD-10-CM

## 2021-03-28 HISTORY — PX: PARATHYROIDECTOMY: SHX19

## 2021-03-28 HISTORY — PX: THYROIDECTOMY: SHX17

## 2021-03-28 LAB — GLUCOSE, CAPILLARY
Glucose-Capillary: 290 mg/dL — ABNORMAL HIGH (ref 70–99)
Glucose-Capillary: 265 mg/dL — ABNORMAL HIGH (ref 70–99)
Glucose-Capillary: 315 mg/dL — ABNORMAL HIGH (ref 70–99)
Glucose-Capillary: 390 mg/dL — ABNORMAL HIGH (ref 70–99)
Glucose-Capillary: 412 mg/dL — ABNORMAL HIGH (ref 70–99)
Glucose-Capillary: 356 mg/dL — ABNORMAL HIGH (ref 70–99)

## 2021-03-28 LAB — HIV ANTIBODY (ROUTINE TESTING W REFLEX): HIV Screen 4th Generation wRfx: NONREACTIVE

## 2021-03-28 LAB — ALBUMIN: Albumin: 3.5 g/dL (ref 3.5–5.0)

## 2021-03-28 LAB — CALCIUM: Calcium: 8.5 mg/dL — ABNORMAL LOW (ref 8.9–10.3)

## 2021-03-28 SURGERY — THYROIDECTOMY
Anesthesia: General | Site: Neck

## 2021-03-28 MED ORDER — REMIFENTANIL HCL 1 MG IV SOLR: INTRAVENOUS | Status: DC | PRN

## 2021-03-28 MED ORDER — INSULIN ASPART 100 UNIT/ML ~~LOC~~ SOLN: 0.0000 [IU] | Freq: Three times a day (TID) | SUBCUTANEOUS | Status: DC

## 2021-03-28 MED ORDER — MIDAZOLAM HCL 2 MG/2ML IJ SOLN
INTRAMUSCULAR | Status: AC
  Filled 2021-03-28: qty 2

## 2021-03-28 MED ORDER — ACETAMINOPHEN 500 MG PO TABS
1000.0000 mg | ORAL_TABLET | ORAL | Status: AC
Start: 2021-03-28 — End: 2021-03-28

## 2021-03-28 MED ORDER — CHLORHEXIDINE GLUCONATE 0.12 % MT SOLN: 15.0000 mL | Freq: Once | OROMUCOSAL | Status: AC

## 2021-03-28 MED ORDER — PHENYLEPHRINE HCL (PRESSORS) 10 MG/ML IV SOLN
INTRAVENOUS | Status: DC | PRN
  Administered 2021-03-28 (×5): 100 ug via INTRAVENOUS
  Administered 2021-03-28: 200 ug via INTRAVENOUS
  Administered 2021-03-28: 100 ug via INTRAVENOUS

## 2021-03-28 MED ORDER — REMIFENTANIL HCL 1 MG IV SOLR
INTRAVENOUS | Status: DC | PRN
  Administered 2021-03-28: 50 ug via INTRAVENOUS

## 2021-03-28 MED ORDER — FENTANYL CITRATE (PF) 100 MCG/2ML IJ SOLN
INTRAMUSCULAR | Status: AC
  Administered 2021-03-28: 25 ug via INTRAVENOUS
  Filled 2021-03-28: qty 2

## 2021-03-28 MED ORDER — PANTOPRAZOLE SODIUM 40 MG PO TBEC
40.0000 mg | DELAYED_RELEASE_TABLET | Freq: Every day | ORAL | Status: DC
  Administered 2021-03-28 – 2021-03-29 (×2): 40 mg via ORAL
  Filled 2021-03-28 (×2): qty 1

## 2021-03-28 MED ORDER — TRIAMTERENE-HCTZ 37.5-25 MG PO TABS: 1.0000 | ORAL_TABLET | Freq: Every day | ORAL | Status: DC

## 2021-03-28 MED ORDER — CALCIUM CARBONATE 1250 (500 CA) MG PO TABS
1000.0000 mg | ORAL_TABLET | Freq: Three times a day (TID) | ORAL | Status: DC
  Administered 2021-03-28 – 2021-03-29 (×3): 1000 mg via ORAL
  Filled 2021-03-28 (×4): qty 2

## 2021-03-28 MED ORDER — CYCLOBENZAPRINE HCL 10 MG PO TABS: 5.0000 mg | ORAL_TABLET | Freq: Three times a day (TID) | ORAL | Status: DC | PRN

## 2021-03-28 MED ORDER — NORTRIPTYLINE HCL 25 MG PO CAPS
50.0000 mg | ORAL_CAPSULE | Freq: Every day | ORAL | Status: DC
  Administered 2021-03-28: 50 mg via ORAL
  Filled 2021-03-28 (×2): qty 2

## 2021-03-28 MED ORDER — PROPOFOL 500 MG/50ML IV EMUL
INTRAVENOUS | Status: AC
  Filled 2021-03-28: qty 50

## 2021-03-28 MED ORDER — FUROSEMIDE 40 MG PO TABS
40.0000 mg | ORAL_TABLET | Freq: Every day | ORAL | Status: DC
  Administered 2021-03-28 – 2021-03-29 (×2): 40 mg via ORAL
  Filled 2021-03-28 (×2): qty 1

## 2021-03-28 MED ORDER — REMIFENTANIL HCL 1 MG IV SOLR
INTRAVENOUS | Status: DC | PRN
  Administered 2021-03-28: .15 ug/kg/min via INTRAVENOUS

## 2021-03-28 MED ORDER — PROPOFOL 10 MG/ML IV BOLUS
INTRAVENOUS | Status: AC
  Filled 2021-03-28: qty 20

## 2021-03-28 MED ORDER — OXYCODONE HCL 5 MG PO TABS: 5.0000 mg | ORAL_TABLET | Freq: Once | ORAL | Status: DC | PRN

## 2021-03-28 MED ORDER — SODIUM CHLORIDE 0.9 % IV SOLN
INTRAVENOUS | Status: DC | PRN
  Administered 2021-03-28: 20 ug/min via INTRAVENOUS

## 2021-03-28 MED ORDER — OXYCODONE-ACETAMINOPHEN 5-325 MG PO TABS
1.0000 | ORAL_TABLET | Freq: Four times a day (QID) | ORAL | Status: DC | PRN
  Administered 2021-03-28 – 2021-03-29 (×3): 1 via ORAL
  Filled 2021-03-28 (×3): qty 1

## 2021-03-28 MED ORDER — LACTATED RINGERS IV SOLN: INTRAVENOUS | Status: DC

## 2021-03-28 MED ORDER — ONDANSETRON HCL 4 MG/2ML IJ SOLN: 4.0000 mg | Freq: Once | INTRAMUSCULAR | Status: DC | PRN

## 2021-03-28 MED ORDER — CHLORHEXIDINE GLUCONATE 0.12 % MT SOLN
OROMUCOSAL | Status: AC
  Administered 2021-03-28: 15 mL via OROMUCOSAL
  Filled 2021-03-28: qty 15

## 2021-03-28 MED ORDER — ORAL CARE MOUTH RINSE: 15.0000 mL | Freq: Once | OROMUCOSAL | Status: AC

## 2021-03-28 MED ORDER — SIMETHICONE 80 MG PO CHEW: 40.0000 mg | CHEWABLE_TABLET | Freq: Four times a day (QID) | ORAL | Status: DC | PRN

## 2021-03-28 MED ORDER — OXYCODONE HCL 5 MG/5ML PO SOLN
5.0000 mg | Freq: Once | ORAL | Status: DC | PRN
Start: 2021-03-28 — End: 2021-03-28

## 2021-03-28 MED ORDER — FENTANYL CITRATE (PF) 100 MCG/2ML IJ SOLN
INTRAMUSCULAR | Status: AC
  Filled 2021-03-28: qty 2

## 2021-03-28 MED ORDER — EPHEDRINE SULFATE 50 MG/ML IJ SOLN
INTRAMUSCULAR | Status: DC | PRN
  Administered 2021-03-28: 5 mg via INTRAVENOUS

## 2021-03-28 MED ORDER — GABAPENTIN 600 MG PO TABS
600.0000 mg | ORAL_TABLET | Freq: Three times a day (TID) | ORAL | Status: DC
  Administered 2021-03-28 – 2021-03-29 (×3): 600 mg via ORAL
  Filled 2021-03-28 (×3): qty 1

## 2021-03-28 MED ORDER — SUCCINYLCHOLINE CHLORIDE 20 MG/ML IJ SOLN
INTRAMUSCULAR | Status: DC | PRN
  Administered 2021-03-28: 120 mg via INTRAVENOUS

## 2021-03-28 MED ORDER — APREPITANT 40 MG PO CAPS
ORAL_CAPSULE | ORAL | Status: AC
  Administered 2021-03-28: 40 mg via ORAL
  Filled 2021-03-28: qty 1

## 2021-03-28 MED ORDER — ACETAMINOPHEN 500 MG PO TABS
1000.0000 mg | ORAL_TABLET | Freq: Four times a day (QID) | ORAL | Status: DC
  Administered 2021-03-28 – 2021-03-29 (×3): 1000 mg via ORAL
  Filled 2021-03-28 (×4): qty 2

## 2021-03-28 MED ORDER — PROPOFOL 10 MG/ML IV BOLUS
INTRAVENOUS | Status: DC | PRN
  Administered 2021-03-28: 40 mg via INTRAVENOUS
  Administered 2021-03-28: 50 mg via INTRAVENOUS
  Administered 2021-03-28: 150 mg via INTRAVENOUS

## 2021-03-28 MED ORDER — INSULIN ASPART 100 UNIT/ML ~~LOC~~ SOLN
15.0000 [IU] | Freq: Once | SUBCUTANEOUS | Status: AC
  Administered 2021-03-28: 15 [IU] via SUBCUTANEOUS

## 2021-03-28 MED ORDER — FENTANYL CITRATE (PF) 100 MCG/2ML IJ SOLN
25.0000 ug | INTRAMUSCULAR | Status: DC | PRN
  Administered 2021-03-28 (×3): 25 ug via INTRAVENOUS

## 2021-03-28 MED ORDER — HEMOSTATIC AGENTS (NO CHARGE) OPTIME
TOPICAL | Status: DC | PRN
  Administered 2021-03-28: 1 via TOPICAL

## 2021-03-28 MED ORDER — ONDANSETRON HCL 4 MG/2ML IJ SOLN
INTRAMUSCULAR | Status: AC
  Filled 2021-03-28: qty 2

## 2021-03-28 MED ORDER — TRAZODONE HCL 50 MG PO TABS
50.0000 mg | ORAL_TABLET | Freq: Every day | ORAL | Status: DC
  Administered 2021-03-28: 50 mg via ORAL
  Filled 2021-03-28: qty 1

## 2021-03-28 MED ORDER — IBUPROFEN 400 MG PO TABS
600.0000 mg | ORAL_TABLET | Freq: Four times a day (QID) | ORAL | Status: DC | PRN
Start: 2021-03-28 — End: 2021-03-29

## 2021-03-28 MED ORDER — REMIFENTANIL HCL 1 MG IV SOLR
INTRAVENOUS | Status: AC
  Filled 2021-03-28: qty 1000

## 2021-03-28 MED ORDER — PROPOFOL 500 MG/50ML IV EMUL
INTRAVENOUS | Status: DC | PRN
  Administered 2021-03-28: 135 ug/kg/min via INTRAVENOUS

## 2021-03-28 MED ORDER — GABAPENTIN 100 MG PO CAPS
200.0000 mg | ORAL_CAPSULE | Freq: Three times a day (TID) | ORAL | Status: DC
  Administered 2021-03-28 – 2021-03-29 (×3): 200 mg via ORAL
  Filled 2021-03-28 (×3): qty 2

## 2021-03-28 MED ORDER — ONDANSETRON 4 MG PO TBDP: 4.0000 mg | ORAL_TABLET | Freq: Four times a day (QID) | ORAL | Status: DC | PRN

## 2021-03-28 MED ORDER — CHLORHEXIDINE GLUCONATE CLOTH 2 % EX PADS: 6.0000 | MEDICATED_PAD | Freq: Once | CUTANEOUS | Status: DC

## 2021-03-28 MED ORDER — ONDANSETRON HCL 4 MG/2ML IJ SOLN: 4.0000 mg | Freq: Four times a day (QID) | INTRAMUSCULAR | Status: DC | PRN

## 2021-03-28 MED ORDER — DEXAMETHASONE SODIUM PHOSPHATE 10 MG/ML IJ SOLN
INTRAMUSCULAR | Status: DC | PRN
  Administered 2021-03-28: 10 mg via INTRAVENOUS

## 2021-03-28 MED ORDER — SODIUM CHLORIDE 0.9 % IV SOLN: INTRAVENOUS | Status: DC

## 2021-03-28 MED ORDER — TRIAMTERENE-HCTZ 37.5-25 MG PO TABS
1.0000 | ORAL_TABLET | Freq: Every day | ORAL | Status: DC
  Administered 2021-03-29: 1 via ORAL
  Filled 2021-03-28: qty 1

## 2021-03-28 MED ORDER — LISINOPRIL 10 MG PO TABS
5.0000 mg | ORAL_TABLET | Freq: Every day | ORAL | Status: DC
  Administered 2021-03-28 – 2021-03-29 (×2): 5 mg via ORAL
  Filled 2021-03-28 (×2): qty 1

## 2021-03-28 MED ORDER — MIDAZOLAM HCL 2 MG/2ML IJ SOLN
INTRAMUSCULAR | Status: DC | PRN
  Administered 2021-03-28: 2 mg via INTRAVENOUS

## 2021-03-28 MED ORDER — LIDOCAINE HCL (PF) 2 % IJ SOLN
INTRAMUSCULAR | Status: AC
  Filled 2021-03-28: qty 5

## 2021-03-28 MED ORDER — APREPITANT 40 MG PO CAPS: 40.0000 mg | ORAL_CAPSULE | Freq: Every day | ORAL | Status: DC

## 2021-03-28 MED ORDER — MENTHOL 3 MG MT LOZG
1.0000 | LOZENGE | OROMUCOSAL | Status: DC | PRN
  Filled 2021-03-28: qty 9

## 2021-03-28 MED ORDER — INSULIN ASPART 100 UNIT/ML ~~LOC~~ SOLN
0.0000 [IU] | Freq: Three times a day (TID) | SUBCUTANEOUS | Status: DC
  Administered 2021-03-28: 20 [IU] via SUBCUTANEOUS
  Administered 2021-03-29: 11 [IU] via SUBCUTANEOUS
  Administered 2021-03-29: 7 [IU] via SUBCUTANEOUS
  Filled 2021-03-28 (×4): qty 1

## 2021-03-28 MED ORDER — ATORVASTATIN CALCIUM 20 MG PO TABS
20.0000 mg | ORAL_TABLET | Freq: Every day | ORAL | Status: DC
  Administered 2021-03-28: 20 mg via ORAL
  Filled 2021-03-28: qty 1

## 2021-03-28 MED ORDER — ONDANSETRON HCL 4 MG/2ML IJ SOLN
INTRAMUSCULAR | Status: DC | PRN
  Administered 2021-03-28: 4 mg via INTRAVENOUS

## 2021-03-28 MED ORDER — MELATONIN 5 MG PO TABS
10.0000 mg | ORAL_TABLET | Freq: Every day | ORAL | Status: DC
  Administered 2021-03-28: 10 mg via ORAL
  Filled 2021-03-28: qty 2

## 2021-03-28 MED ORDER — ACETAMINOPHEN 500 MG PO TABS
ORAL_TABLET | ORAL | Status: AC
  Administered 2021-03-28: 1000 mg via ORAL
  Filled 2021-03-28: qty 2

## 2021-03-28 MED ORDER — LEVOTHYROXINE SODIUM 50 MCG PO TABS
150.0000 ug | ORAL_TABLET | Freq: Every day | ORAL | Status: DC
  Administered 2021-03-29: 150 ug via ORAL
  Filled 2021-03-28: qty 1

## 2021-03-28 MED ORDER — FENTANYL CITRATE (PF) 100 MCG/2ML IJ SOLN
INTRAMUSCULAR | Status: DC | PRN
  Administered 2021-03-28 (×2): 25 ug via INTRAVENOUS
  Administered 2021-03-28: 100 ug via INTRAVENOUS

## 2021-03-28 MED ORDER — DEXAMETHASONE SODIUM PHOSPHATE 10 MG/ML IJ SOLN
INTRAMUSCULAR | Status: AC
  Filled 2021-03-28: qty 1

## 2021-03-28 SURGICAL SUPPLY — 50 items
ADH SKN CLS APL DERMABOND .7 (GAUZE/BANDAGES/DRESSINGS) ×2
BACTOSHIELD CHG 4% 4OZ (MISCELLANEOUS) ×1
BASIN GRAD PLASTIC 32OZ STRL (MISCELLANEOUS) ×3 IMPLANT
BLADE SURG 15 STRL LF DISP TIS (BLADE) ×2 IMPLANT
BLADE SURG 15 STRL SS (BLADE) ×3
CANISTER SUCT 1200ML W/VALVE (MISCELLANEOUS) IMPLANT
CLIP VESOCCLUDE SM WIDE 6/CT (CLIP) ×3 IMPLANT
CNTNR SPEC 2.5X3XGRAD LEK (MISCELLANEOUS) ×2
CONT SPEC 4OZ STER OR WHT (MISCELLANEOUS) ×1
CONT SPEC 4OZ STRL OR WHT (MISCELLANEOUS) ×2
CONTAINER SPEC 2.5X3XGRAD LEK (MISCELLANEOUS) ×2 IMPLANT
COVER WAND RF STERILE (DRAPES) ×3 IMPLANT
DERMABOND ADVANCED (GAUZE/BANDAGES/DRESSINGS) ×1
DERMABOND ADVANCED .7 DNX12 (GAUZE/BANDAGES/DRESSINGS) ×2 IMPLANT
DRAPE C-ARM XRAY 36X54 (DRAPES) ×3 IMPLANT
DRAPE LAPAROTOMY 77X122 PED (DRAPES) ×3 IMPLANT
DRAPE MAG INST 16X20 L/F (DRAPES) ×3 IMPLANT
DRSG TEGADERM 2-3/8X2-3/4 SM (GAUZE/BANDAGES/DRESSINGS) ×3 IMPLANT
ELECT CAUTERY BLADE TIP 2.5 (TIP) ×3
ELECT LARYNGEAL DUAL CHAN (ELECTRODE) ×3 IMPLANT
ELECT NEEDLE 20X.3 GREEN (MISCELLANEOUS) ×3
ELECT REM PT RETURN 9FT ADLT (ELECTROSURGICAL) ×3
ELECTRODE CAUTERY BLDE TIP 2.5 (TIP) ×2 IMPLANT
ELECTRODE NEEDLE 20X.3 GREEN (MISCELLANEOUS) ×2 IMPLANT
ELECTRODE REM PT RTRN 9FT ADLT (ELECTROSURGICAL) ×2 IMPLANT
GAUZE 4X4 16PLY RFD (DISPOSABLE) IMPLANT
GLOVE SURG ENC MOIS LTX SZ6.5 (GLOVE) ×6 IMPLANT
GLOVE SURG UNDER LTX SZ7 (GLOVE) ×3 IMPLANT
GOWN STRL REUS W/ TWL LRG LVL3 (GOWN DISPOSABLE) ×4 IMPLANT
GOWN STRL REUS W/TWL LRG LVL3 (GOWN DISPOSABLE) ×6
HEMOSTAT SNOW SURGICEL 2X4 (HEMOSTASIS) ×3 IMPLANT
KIT TURNOVER KIT A (KITS) ×3 IMPLANT
LABEL OR SOLS (LABEL) ×3 IMPLANT
MANIFOLD NEPTUNE II (INSTRUMENTS) ×3 IMPLANT
NS IRRIG 500ML POUR BTL (IV SOLUTION) ×3 IMPLANT
PACK BASIN MINOR ARMC (MISCELLANEOUS) ×3 IMPLANT
PROBE NEUROSIGN BIPOL (MISCELLANEOUS) ×2 IMPLANT
PROBE NEUROSIGN BIPOLAR (MISCELLANEOUS) ×3
SCRUB CHG 4% DYNA-HEX 4OZ (MISCELLANEOUS) ×2 IMPLANT
SET WALTER ACTIVATION W/DRAPE (SET/KITS/TRAYS/PACK) ×3 IMPLANT
SHEARS HARMONIC 9CM CVD (BLADE) ×3 IMPLANT
SPONGE KITTNER 5P (MISCELLANEOUS) ×3 IMPLANT
STRIP CLOSURE SKIN 1/2X4 (GAUZE/BANDAGES/DRESSINGS) ×3 IMPLANT
SUT MNCRL AB 4-0 PS2 18 (SUTURE) ×3 IMPLANT
SUT PROLENE 4 0 PS 2 18 (SUTURE) ×3 IMPLANT
SUT SILK 2 0 (SUTURE) ×6
SUT SILK 2-0 18XBRD TIE 12 (SUTURE) ×4 IMPLANT
SUT VIC AB 4-0 RB1 27 (SUTURE) ×3
SUT VIC AB 4-0 RB1 27X BRD (SUTURE) ×2 IMPLANT
SYR BULB IRRIG 60ML STRL (SYRINGE) ×3 IMPLANT

## 2021-03-28 NOTE — Transfer of Care (Signed)
Immediate Anesthesia Transfer of Care Note  Patient: Gabriella Marsh  Procedure(s) Performed: THYROIDECTOMY, total (N/A Neck) PARATHYROIDECTOMY AUTOTRANSPLANT  Patient Location: PACU  Anesthesia Type:General  Level of Consciousness: awake, alert  and oriented  Airway & Oxygen Therapy: Patient Spontanous Breathing and Patient connected to face mask oxygen  Post-op Assessment: Report given to RN and Post -op Vital signs reviewed and stable  Post vital signs: Reviewed and stable  Last Vitals:  Vitals Value Taken Time  BP 164/88 03/28/21 1030  Temp 36.9 C 03/28/21 1026  Pulse 111 03/28/21 1042  Resp 15 03/28/21 1042  SpO2 97 % 03/28/21 1042  Vitals shown include unvalidated device data.  Last Pain:  Vitals:   03/28/21 1026  TempSrc:   PainSc: Asleep      Patients Stated Pain Goal: 0 (60/47/99 8721)  Complications: No complications documented.

## 2021-03-28 NOTE — H&P (Signed)
HPI Gabriella Marsh is a 55 y.o. female.   She has been referred by her primary care provider, [name], PA-C, for consideration of thyroid biopsy.  Several years ago, her primary care provider palpated a goiter on physical examination.  This led to an ultrasound and subsequent biopsy of 2 nodules in the thyroid, both of which were benign.  She had a follow-up ultrasound done about a year ago and the imaging characteristics of the dominant right-sided nodule had changed significantly, suggesting that repeat biopsy is indicated.  The left inferior nodule appeared stable, but a left midpole nodule also had changes to its imaging that suggested biopsy was warranted.  Gabriella Marsh states that she does have difficulty swallowing pills, but denies other significant dysphagia.  No voice changes.  She denies any dyspnea in a supine position or any pressure sensation in her neck while supine.  No heart palpitations or hand tremors.  She does endorse that her hair seems a little bit brittle but otherwise denies any changes in her skin or fingernails.  No diarrhea or constipation.  Weight has been stable.  She does state that she feels subjectively colder since she has been on Plavix.  She has no history of head or neck irradiation or occupational exposure to ionizing radiation.  No family history of thyroid disease or thyroid cancer.       Past Medical History:  Diagnosis Date  . Arthritis    knees  . Degenerative disc disease, lumbar   . Diabetes mellitus without complication ([place])   . GERD (gastroesophageal reflux disease)   . Headache    daily - AM (has not been able to have SPG blocks lately)  . Hyperlipidemia   . Hypertension   . Neuropathy    feet  . Vertigo    3-4x/yr         Past Surgical History:  Procedure Laterality Date  . ABDOMINAL HYSTERECTOMY     But still has cervix  . CESAREAN SECTION    . CHOLECYSTECTOMY    . OVARIAN CYST SURGERY    .  SHOULDER SURGERY Left 12/26/014   Dr. Little Ishikawa, [facility]  . TONSILLECTOMY AND ADENOIDECTOMY    . UTERINE FIBROID SURGERY      Family History  Problem Relation Age of Onset  . Diabetes Father   . Heart disease Father   . Hypertension Father   . Hyperlipidemia Father   . Congestive Heart Failure Father   . Healthy Brother   . Osteoporosis Mother   . Irritable bowel syndrome Mother   . Osteoporosis Maternal Grandmother     Social History Social History        Tobacco Use  . Smoking status: Former Smoker    Quit date: 2013    Years since quitting: 9.0  . Smokeless tobacco: Never Used  Vaping Use  . Vaping Use: Never used  Substance Use Topics  . Alcohol use: No    Alcohol/week: 0.0 standard drinks  . Drug use: Never         Allergies  Allergen Reactions  . Celecoxib Shortness Of Breath  . Pregabalin Shortness Of Breath  . Buspar [Buspirone] Other (See Comments)    Tachycardia  . Citalopram Cough          Current Outpatient Medications  Medication Sig Dispense Refill  . atorvastatin (LIPITOR) 20 MG tablet Take 1 tablet (20 mg total) by mouth daily. 90 tablet 3  . clopidogrel (PLAVIX) 75 MG tablet  Take 1 tablet (75 mg total) by mouth daily. 30 tablet 6  . cyclobenzaprine (FLEXERIL) 5 MG tablet Take 1 tablet (5 mg total) by mouth 3 (three) times daily as needed for muscle spasms. 270 tablet 1  . Dulaglutide (TRULICITY) 1.5 EX/[id number] SOPN Inject 1.5 mg into the skin once a week. 3 mL 5  . furosemide (LASIX) 20 MG tablet TAKE 2 TABLETS(40 MG) BY MOUTH DAILY AS NEEDED FOR FLUID RETENTION OR SWELLING 180 tablet 3  . gabapentin (NEURONTIN) 100 MG capsule TAKE 2 CAPSULES THREE TIMES A DAY ALONG WITH 600 MG THREE TIMES A DAY FOR A TOTAL DAILY DOSE OF 2400 MG DAILY 360 capsule 3  . gabapentin (NEURONTIN) 600 MG tablet TAKE 1 TABLET(600 MG) BY MOUTH THREE TIMES DAILY 270 tablet 3  . glipiZIDE (GLUCOTROL) 10 MG tablet TAKE ONE-HALF TABLET BY  MOUTH  TWICE DAILY BEFORE  MEALS 90 tablet 3  . lisinopril (ZESTRIL) 5 MG tablet Take 1 tablet (5 mg total) by mouth daily. 90 tablet 3  . Melatonin 3 MG TABS Take 1 tablet by mouth at bedtime.    . metFORMIN (GLUMETZA) 1000 MG (MOD) 24 hr tablet Take 1,000 mg by mouth 2 (two) times daily with a meal.    . Multiple Vitamins-Minerals (MULTIVITAMIN ADULT PO) Take by mouth.    . mupirocin ointment (BACTROBAN) 2 % Apply 1 application topically daily. 22 g 0  . nortriptyline (PAMELOR) 25 MG capsule Take 2 capsules (50 mg total) by mouth at bedtime. 180 capsule 3  . omeprazole (PRILOSEC) 20 MG capsule Take 1 capsule (20 mg total) by mouth daily. 90 capsule 3  . oxyCODONE-acetaminophen (PERCOCET/ROXICET) 5-325 MG tablet Take 1 tablet by mouth every 6 (six) hours as needed for severe pain. 120 tablet 0  . potassium chloride SA (KLOR-CON) 20 MEQ tablet Take 1 tablet (20 mEq total) by mouth daily. Take any time she takes lasix 90 tablet 3  . traZODone (DESYREL) 50 MG tablet TAKE 1 TO 2 TABLETS(50 TO 100 MG) BY MOUTH AT BEDTIME AS NEEDED FOR SLEEP 180 tablet 1  . triamterene-hydrochlorothiazide (MAXZIDE-25) 37.5-25 MG tablet Take 1 tablet by mouth daily.     No current facility-administered medications for this visit.    Review of Systems Review of Systems  Eyes: Positive for visual disturbance.  Cardiovascular: Positive for leg swelling.  Neurological: Positive for dizziness, numbness and headaches.       Paresthesias and neuropathic pain  All other systems reviewed and are negative. Or as discussed in the history of present illness.  Today's Vitals   03/28/21 0621  BP: (!) 154/92  Pulse: 98  Resp: 18  Temp: 97.6 F (36.4 C)  TempSrc: Temporal  SpO2: 97%  Weight: 97.5 kg  Height: 5\' 10"  (1.778 m)  PainSc: 0-No pain   Body mass index is 30.84 kg/m.   Physical Exam Physical Exam Constitutional:      General: She is not in acute distress.    Appearance: She is obese.  HENT:      Head: Normocephalic and atraumatic.     Nose:     Comments: Covered with a mask    Mouth/Throat:     Comments: Covered with a mask Eyes:     General: No scleral icterus.       Left eye: No discharge.     Conjunctiva/sclera: Conjunctivae normal.     Comments: No proptosis or exophthalmos  Neck:     Comments: The trachea is midline.  The thyroid is enlarged bilaterally, right significantly greater than left.  The gland moves freely with deglutition.  No palpable cervical or supraclavicular lymphadenopathy. Cardiovascular:     Rate and Rhythm: Normal rate and regular rhythm.     Comments: Although the vital signs document tachycardia, at the time of my evaluation, her rate was normal. Pulmonary:     Effort: Pulmonary effort is normal. No respiratory distress.     Breath sounds: Normal breath sounds.  Genitourinary:    Comments: Deferred Musculoskeletal:     Comments: Hands are gloved secondary to neuropathy.  She has trace bilateral lower extremity edema and the skin of her lower legs is cool and slightly mottled.  Skin:    General: Skin is dry.     Coloration: Skin is not jaundiced.  Neurological:     General: No focal deficit present.     Mental Status: She is alert.  Psychiatric:        Mood and Affect: Mood normal.        Behavior: Behavior normal.     Data Reviewed Results for MARRIANNE, SICA (MRN [id number]) as of 01/23/2021 13:30  Ref. Range 10/12/2014 00:00 03/06/2015 09:11 01/21/2018 10:06 01/23/2019 16:09 01/03/2021 16:13  TSH Latest Ref Range: 0.450 - 4.500 uIU/mL 1.19 1.110 1.220 1.530 1.750   Thyroid function has been normal on each occasion documented here.  I personally reviewed the ultrasound imaging from 2016, along with the images of the FNA biopsies that were performed at that time.  I also reviewed the ultrasound performed October 16 2019 and concur with the radiologist impression regarding the changes seen in the right sided nodule as well as the more  superior left-sided nodule.  The radiology impression is copied here:  CLINICAL DATA: Thyroid nodule follow-up  EXAM: THYROID ULTRASOUND  TECHNIQUE: Ultrasound examination of the thyroid gland and adjacent soft tissues was performed.  COMPARISON: March 19, 2015  FINDINGS: Parenchymal Echotexture: Moderately heterogenous  Isthmus: 0.9 cm  Right lobe: 5.7 x 2.5 x 2.5 cm  Left lobe: 6 x 2 x 1.9 cm  _________________________________________________________  Estimated total number of nodules >/= 1 cm: 3  Number of spongiform nodules >/= 2 cm not described below (TR1): 0  Number of mixed cystic and solid nodules >/= 1.5 cm not described below (TR2): 0  _________________________________________________________  Nodule # 1:  Prior biopsy: Yes  Location: Right; Mid  Maximum size: 2.8 cm; Other 2 dimensions: 2.3 x 2.2 cm, previously, 2.1 x 1.2 x 1.9 cm  Composition: solid/almost completely solid (2)  Echogenicity: isoechoic (1)  Shape: taller-than-wide (3)  Margins: smooth (0)  Echogenic foci: none (0)  ACR TI-RADS total points: 6.  ACR TI-RADS risk category: TR4 (4-6 points).  Significant change in size (>/= 20% in two dimensions and minimal increase of 2 mm): Yes  Change in features: Yes  Change in ACR TI-RADS risk category: Yes  ACR TI-RADS recommendations:  **Given size (>/= 1.5 cm) and appearance, fine needle aspiration of this moderately suspicious nodule should be considered based on TI-RADS criteria.  This nodule has changed in appearance as it is mostly solid on today's exam. Previously was mostly cystic.  _________________________________________________________  Nodule # 2:  Prior biopsy: No  Location: Left; Mid  Maximum size: 1.6 cm; Other 2 dimensions: 1.4 x 0.7 cm, previously, 1.1 x 0.6 x 0.8 cm  Composition: solid/almost completely solid (2)  Echogenicity: hypoechoic (2)  Shape: not  taller-than-wide (0)  Margins: smooth (0)  Echogenic  foci: none (0)  ACR TI-RADS total points: 4.  ACR TI-RADS risk category: TR4 (4-6 points).  Significant change in size (>/= 20% in two dimensions and minimal increase of 2 mm): No  Change in features: No  Change in ACR TI-RADS risk category: Yes  ACR TI-RADS recommendations:  **Given size (>/= 1.5 cm) and appearance, fine needle aspiration of this moderately suspicious nodule should be considered based on TI-RADS criteria.  _________________________________________________________  Nodule # 3:  Prior biopsy: Yes  Location: Left; Inferior  Maximum size: 2.1 cm; Other 2 dimensions: 2.1 x 1.8 cm, previously, 2.2 x 1.7 x 2.1 cm  Composition: solid/almost completely solid (2)  Echogenicity: isoechoic (1)  Shape: not taller-than-wide (0)  Margins: ill-defined (0)  Echogenic foci: none (0)  ACR TI-RADS total points: 3.  ACR TI-RADS risk category: TR3 (3 points).  Significant change in size (>/= 20% in two dimensions and minimal increase of 2 mm): No  Change in features: No  Change in ACR TI-RADS risk category: No  ACR TI-RADS recommendations:  *Given size (>/= 1.5 - 2.4 cm) and appearance, a follow-up ultrasound in 1 year should be considered based on TI-RADS criteria.  _________________________________________________________  IMPRESSION: 1. Enlarged multinodular goiter as detailed above. 2. Dominant right-sided thyroid nodule measuring 2.8 cm, increased in size from prior study when it measured 2.1 cm. Additionally, this nodule is now more solid than cystic and has been upgraded to a TR 4 thyroid nodule and meets criteria for FNA. This nodule was previously biopsied. Given its change in appearance, a repeat biopsy should be considered. 3. A 1.6 cm left mid thyroid. This thyroid nodule now meets criteria for FNA. 4. Stable 2.1 cm left inferior thyroid nodule that was  reportedly previously biopsied. Correlation with prior biopsy results is recommended.  The above is in keeping with the ACR TI-RADS recommendations - J Am Coll Radiol 2017;14:587-595.   Assessment This is a 55 year old woman with a multinodular goiter.  Prior biopsies of a dominant left inferior nodule and right sided thyroid nodule were previously benign, however in the interim since 2016, the imaging characteristics of a midpole left thyroid nodule and the dominant right-sided nodule have changed significantly.  Based upon these changes, fine-needle aspiration biopsy was warranted.  Plan Fine-needle aspiration biopsy was performed in clinic today on both nodules.  I will contact the patient once I have the results.  Procedure Note: Ultrasound-Guided Fine Needle Aspiration Biopsy  Indications:  thyroid nodule (right)  Anesthetic: ethyl chloride spray  Procedure: Under direct sonographic guidance, the target lesion was isolated and sampled using a 25-gauge needle.  A total of 4 passes were made.  The air-dried Diff-Quick stained slides were viewed on-site by the performing surgeon.  ThyroSeq [facility]) was collected. Pressure was held and an ice pack applied. Specimen(s) sent to pathology for evaluation. The patient tolerated the procedure fairly well.   Findings: Thyroid follicular cells seen on the Diff-Quik stained slides.  The slides were very cellular.  Complications: none  Procedure Note: Ultrasound-Guided Fine Needle Aspiration Biopsy  Indications:  thyroid nodule (left)  Anesthetic: ethyl chloride spray  Procedure: Under direct sonographic guidance, the target lesion was isolated and sampled using a 25-gauge needle.  A total of 4 passes were made.  The air-dried Diff-Quick stained slides were viewed on-site by the performing surgeon.  ThyroSeq [facility]) was collected. Pressure was held and an ice pack applied. Specimen(s) sent to pathology for evaluation. The patient  tolerated the procedure fairly well.  Findings: The samples were predominantly cystic, but a few thyroid follicular cells were appreciated on the third pass.  Due to the cellularity seen on the right sided sample, I elected not to perform additional passes on the left.  Complications: none  Final cytopath on the right was indeterminate and ThyroSeq was suspicious--70% risk of malignancy or NFTIP.  Will proceed with thyroidectomy. The risks of thyroid surgery were discussed, including (but not limited to): bleeding, infection, damage to surrounding structures/tissues, injury (temporary or permanent) to the recurrent laryngeal nerve, hypoparathyroidism (temporary or permanent), need for thyroid hormone replacement therapy, need for additional surgery and/or treatment, recurrence of disease, tracheostomy (temporary or permanent).  The patient had the opportunity to ask any questions and these were answered to their satisfaction.

## 2021-03-28 NOTE — Progress Notes (Signed)
   03/28/21 1203  Assess: MEWS Score  Temp 97.7 F (36.5 C)  BP (!) 152/87  Pulse Rate (!) 111  Resp 20  SpO2 94 %  O2 Device Nasal Cannula  O2 Flow Rate (L/min) 2 L/min  Assess: MEWS Score  MEWS Temp 0  MEWS Systolic 0  MEWS Pulse 2  MEWS RR 0  MEWS LOC 0  MEWS Score 2  MEWS Score Color Yellow  Assess: if the MEWS score is Yellow or Red  Were vital signs taken at a resting state? Yes  Focused Assessment No change from prior assessment  Early Detection of Sepsis Score *See Row Information* Low  MEWS guidelines implemented *See Row Information* Yes  Treat  MEWS Interventions Administered scheduled meds/treatments  Pain Scale 0-10  Pain Score 0  Take Vital Signs  Increase Vital Sign Frequency  Yellow: Q 2hr X 2 then Q 4hr X 2, if remains yellow, continue Q 4hrs  Escalate  MEWS: Escalate Yellow: discuss with charge nurse/RN and consider discussing with provider and RRT  Notify: Charge Nurse/RN  Name of Charge Nurse/RN Notified Geoffry Paradise  Date Charge Nurse/RN Notified 03/28/21  Time Charge Nurse/RN Notified 1300  Notify: Provider  Provider Name/Title Thedore Mins ,PA  Date Provider Notified 03/28/21  Time Provider Notified 1406  Notification Type Page  Notification Reason Change in status  Provider response No new orders  Date of Provider Response 03/28/21  Time of Provider Response 1407  Document  Patient Outcome Other (Comment) (remains on the  floor, cont to  monitor... )  Progress note created (see row info) Yes

## 2021-03-28 NOTE — Anesthesia Preprocedure Evaluation (Signed)
Anesthesia Evaluation  Patient identified by MRN, date of birth, ID band Patient awake  General Assessment Comment:Goiter/nodules in thyroid, not easily visible or palpable, no compressive airway sx  Reviewed: Allergy & Precautions, NPO status , Patient's Chart, lab work & pertinent test results  History of Anesthesia Complications Negative for: history of anesthetic complications  Airway Mallampati: III  TM Distance: >3 FB Neck ROM: Full    Dental no notable dental hx. (+) Teeth Intact, Missing,    Pulmonary neg pulmonary ROS, neg sleep apnea, neg COPD, Patient abstained from smoking.Not current smoker, former smoker,    Pulmonary exam normal breath sounds clear to auscultation       Cardiovascular Exercise Tolerance: Good METShypertension, + Peripheral Vascular Disease  (-) CAD and (-) Past MI (-) dysrhythmias  Rhythm:Regular Rate:Normal - Systolic murmurs    Neuro/Psych  Headaches, PSYCHIATRIC DISORDERS Anxiety    GI/Hepatic GERD  Medicated and Controlled,(+)     (-) substance abuse  ,   Endo/Other  diabetes  Renal/GU negative Renal ROS     Musculoskeletal   Abdominal   Peds  Hematology   Anesthesia Other Findings Past Medical History: No date: Arthritis     Comment:  knees No date: Blood clotting disorder ([facility])     Comment:  on toe  No date: Degenerative disc disease, lumbar No date: Diabetes mellitus without complication ([facility]) No date: GERD (gastroesophageal reflux disease) No date: Headache     Comment:  daily - AM (has not been able to have SPG blocks lately) No date: Hyperlipidemia No date: Hypertension No date: Neuropathy     Comment:  feet No date: Vertigo     Comment:  3-4x/yr  Reproductive/Obstetrics                            Anesthesia Physical Anesthesia Plan  ASA: II  Anesthesia Plan: General   Post-op Pain Management:    Induction: Intravenous  PONV Risk  Score and Plan: 4 or greater and Ondansetron, Dexamethasone, Propofol infusion, TIVA and Midazolam  Airway Management Planned: Oral ETT and Video Laryngoscope Planned  Additional Equipment: None  Intra-op Plan:   Post-operative Plan: Extubation in OR  Informed Consent: I have reviewed the patients History and Physical, chart, labs and discussed the procedure including the risks, benefits and alternatives for the proposed anesthesia with the patient or authorized representative who has indicated his/her understanding and acceptance.     Dental advisory given  Plan Discussed with: CRNA and Surgeon  Anesthesia Plan Comments: (Discussed risks of anesthesia with patient, including PONV, sore throat, lip/dental damage. Rare risks discussed as well, such as cardiorespiratory and neurological sequelae. Patient understands.)        Anesthesia Quick Evaluation

## 2021-03-28 NOTE — Op Note (Signed)
Operative Note  Preoperative Diagnosis:  a suspicious thyroid biopsy  Postoperative Diagnosis:  a suspicious thyroid biopsy  Operation:  Total Thyroidectomy and Parathyroid Autotransplant x 1  Surgeon: Fredirick Maudlin, MD  Assistant: Ronny Bacon, MD (a second surgeon was necessary due to the technical complexity of the case)  Anesthesia: General endotracheal with nerve monitoring system.  Findings: A multinodular goiter, wrapping around posterior to the trachea bilaterally.  Both recurrent laryngeal nerves were identified and gave excellent functional signals throughout the case.  The left inferior parathyroid gland derived its entire blood supply from the capsule of the thyroid and could not be preserved in situ.  It was autotransplanted into the sternocleidomastoid muscle at the end of the case.  The remaining 3 parathyroid glands were identified and preserved in situ.  Indications: This is a 55 year old woman who has a multinodular goiter.  Imaging characteristics of 2 nodules warranted biopsies.  One was benign, but the other came back indeterminant.  Additional molecular diagnostic testing revealed a mutation that carries a roughly 70% risk of noninvasive follicular thyroid neoplasm with papillary-like nuclear features or follicular derived thyroid carcinoma.  It was recommended that she undergo surgical resection.  After discussing the pros and cons of total thyroidectomy versus lobectomy, she selected a total thyroidectomy.  The risks were discussed in detail and she agreed to proceed.  Procedure In Detail: The patient was identified in the preoperative holding area and brought to the operating room where she was placed supine on the OR table.  Bony prominences were padded and bilateral sequential compression devices were placed on the lower extremities.  General endotracheal anesthesia was induced using the nerve monitoring system.  Tube placement was verified with the McGrath  fiberoptic laryngoscope.  The grounding lead was placed.  The patient was positioned appropriately for the operation and sterilely prepped and draped in standard fashion.  A timeout was performed confirming the patient's identity, the procedure being performed, her allergies, all necessary equipment was available, and that maintenance anesthesia was adequate.  A 5 cm transverse incision was made in a natural skin fold that was appropriately positioned for the operation.  This was carried down through the subcutaneous tissues and platysma using electrocautery.  Subplatysmal flaps were elevated and the strap muscles were divided in the median raphae.  We elevated the strap muscles off of the right lobe of the thyroid and retracted them laterally.  We isolated and divided the superior pole vessels with silk ties and the harmonic scalpel.  The superior parathyroid gland was identified and preserved on a good pedicle.  We then rotated the thyroid medially and divided the middle thyroid vein.  The inferior parathyroid gland was dissected off the capsule of the thyroid and preserved on a good pedicle.  We then dissected into the lateral fibrofatty tissues of the central neck and identified the recurrent laryngeal nerve.  It gave a good functional signal when stimulated.  The trachea was exposed medial to the nerve and the inferior pole vessels divided.  The nerve was dissected up to its insertion point at the Carrico pharyngeal muscle.  The ligament of Berry and anterior suspensory ligament were divided.  The gland was dissected off the trachea and across the midline.  There was a substantial pyramidal lobe that was dissected off of the underlying musculature.  It was divided at its most superior aspect using the harmonic scalpel.  We then turned to the left where we proceeded similarly.  The strap muscles were elevated  off the lobe and retracted laterally.  We sequentially isolated and divided the superior pole  vessels with silk ties and the harmonic scalpel.  The superior parathyroid gland was identified and preserved on a good vascular pedicle.  As we continued our dissection, it became clear that the inferior parathyroid gland was subcapsular and derived all of its blood supply from the thyroid capsule itself.  It was dissected off the thyroid and placed in cold saline for autotransplantation.  We continued our dissection.  The tracheoesophageal groove was identified.  The recurrent laryngeal nerve was seen within the groove.  It gave a good signal when stimulated.  The trachea was exposed medial to the nerve and the inferior pole vessels divided.  The nerve was dissected up to its insertion point at the cricopharyngeal muscle.  The remaining attachments of the thyroid to the trachea were divided and the gland was completely excised.  It was handed off as a specimen.  We irrigated our wound beds and obtained good hemostasis.  A Valsalva maneuver from the anesthesia team confirmed no ongoing surgical bleeding. SNoW was applied for additional hemostatic effect.    The strap muscles were closed in the midline with running 4-0 Vicryl.  The preserved parathyroid tissue was then brought into the field.  It was crosshatched to increase its surface area.  It was divided into small pieces.  The sternocleidomastoid muscle was then exposed.  2 small pockets were made into the muscle and into each, parathyroid tissue was placed.  Each pocket was closed and marked with a Ligaclip.  The platysma was then closed with interrupted 4-0 Vicryl and the skin was closed with running subcuticular Prolene.  The skin was cleaned.  Dermabond and Steri-Strips were applied.  The Prolene suture was removed.  The patient was awakened, extubated, and taken to the postanesthesia care unit in good condition.  EBL: Less than 5 cc  IVF: See anesthesia record  Specimen(s): Total thyroid to pathology for permanent section  Complications: none  immediately apparent.   Counts: all needles, instruments, and sponges were counted and reported to be correct in number at the end of the case.   I was present for and participated in the entire operation.  Fredirick Maudlin 10:39 AM

## 2021-03-28 NOTE — Anesthesia Postprocedure Evaluation (Signed)
Anesthesia Post Note  Patient: Gabriella Marsh  Procedure(s) Performed: THYROIDECTOMY, total (N/A Neck) PARATHYROIDECTOMY AUTOTRANSPLANT  Patient location during evaluation: PACU Anesthesia Type: General Level of consciousness: awake and alert Pain management: pain level controlled Vital Signs Assessment: post-procedure vital signs reviewed and stable Respiratory status: spontaneous breathing, nonlabored ventilation, respiratory function stable and patient connected to nasal cannula oxygen Cardiovascular status: blood pressure returned to baseline and stable Postop Assessment: no apparent nausea or vomiting Anesthetic complications: no   No complications documented.   Last Vitals:  Vitals:   03/28/21 1115 03/28/21 1130  BP: (!) 165/84 (!) 160/84  Pulse: (!) 110 (!) 110  Resp: 13 15  Temp: 36.7 C   SpO2: 92% 94%    Last Pain:  Vitals:   03/28/21 1130  TempSrc:   PainSc: 3                  Arita Miss

## 2021-03-28 NOTE — Anesthesia Procedure Notes (Signed)
Procedure Name: Intubation Date/Time: 03/28/2021 7:50 AM Performed by: Allean Found, CRNA Pre-anesthesia Checklist: Patient identified, Patient being monitored, Timeout performed, Emergency Drugs available and Suction available Patient Re-evaluated:Patient Re-evaluated prior to induction Oxygen Delivery Method: Circle system utilized Preoxygenation: Pre-oxygenation with 100% oxygen Induction Type: IV induction Ventilation: Mask ventilation without difficulty Laryngoscope Size: 3 and McGraph Grade View: Grade II Tube type: Oral Tube size: 6.5 mm Number of attempts: 1 Airway Equipment and Method: Stylet Placement Confirmation: ETT inserted through vocal cords under direct vision,  positive ETCO2 and breath sounds checked- equal and bilateral Secured at: 19.5 cm Tube secured with: Tape Dental Injury: Teeth and Oropharynx as per pre-operative assessment

## 2021-03-29 ENCOUNTER — Encounter: Payer: Self-pay | Admitting: General Surgery

## 2021-03-29 DIAGNOSIS — E042 Nontoxic multinodular goiter: Secondary | ICD-10-CM | POA: Diagnosis not present

## 2021-03-29 LAB — CALCIUM: Calcium: 8.2 mg/dL — ABNORMAL LOW (ref 8.9–10.3)

## 2021-03-29 LAB — GLUCOSE, CAPILLARY
Glucose-Capillary: 278 mg/dL — ABNORMAL HIGH (ref 70–99)
Glucose-Capillary: 238 mg/dL — ABNORMAL HIGH (ref 70–99)

## 2021-03-29 LAB — ALBUMIN: Albumin: 3.2 g/dL — ABNORMAL LOW (ref 3.5–5.0)

## 2021-03-29 MED ORDER — IBUPROFEN 600 MG PO TABS: 600.0000 mg | ORAL_TABLET | Freq: Four times a day (QID) | ORAL | 0 refills | Status: DC | PRN

## 2021-03-29 MED ORDER — CALCIUM CARBONATE 1250 (500 CA) MG PO TABS
2.0000 | ORAL_TABLET | Freq: Three times a day (TID) | ORAL | 0 refills | Status: DC
Start: 2021-03-29 — End: 2021-04-01

## 2021-03-29 MED ORDER — LEVOTHYROXINE SODIUM 150 MCG PO TABS: 150.0000 ug | ORAL_TABLET | Freq: Every day | ORAL | 11 refills | Status: DC

## 2021-03-29 MED ORDER — ACETAMINOPHEN 500 MG PO TABS: 1000.0000 mg | ORAL_TABLET | Freq: Four times a day (QID) | ORAL | 0 refills | Status: DC

## 2021-03-29 NOTE — Discharge Summary (Signed)
Physician Discharge Summary  Patient ID: Gabriella Marsh MRN: [id number] DOB/AGE: 1966-06-22 55 y.o.  Admit date: 03/28/2021 Discharge date: 03/29/2021  Admission Diagnoses: Multinodular goiter, suspicious thyroid nodule  Discharge Diagnoses:  Active Problems:   Thyroid nodule   S/P total thyroidectomy   Discharged Condition: good  Hospital Course: The patient was taken to the operating room on Friday, March 28, 2021.  She underwent an uncomplicated total thyroidectomy with parathyroid autotransplant x1.  Overnight, she made good progress and her pain was well controlled.  Calcium levels were within acceptable range.  She was evaluated and felt to be in suitable condition for discharge.  Consults: None  Significant Diagnostic Studies: None  Treatments: surgery: Total thyroidectomy  Discharge Exam: Blood pressure 119/64, pulse 92, temperature 98.1 F (36.7 C), temperature source Oral, resp. rate 16, height 5\' 10"  (1.778 m), weight 97.5 kg, SpO2 97 %. General appearance: alert and no distress Resp: Normal work of breathing on room air Cardio: regular rate and rhythm Incision/Wound: Steri-Strips intact, no hematoma, no erythema or induration  Disposition: Discharge disposition: 01-Home or Self Care       Discharge Instructions    Call MD for:   Complete by: As directed    Numbness or tingling that is different from your usual neuropathy, specifically at the tip of your nose, or around your mouth.  This can be a sign that your calcium is low.  If this occurs, please chew 2-3 Tums.  If it has not gone away in 30 minutes, repeat the Tums.  If it still persists, please contact our office for further instructions.   Call MD for:  difficulty breathing, headache or visual disturbances   Complete by: As directed    Call MD for:  extreme fatigue   Complete by: As directed    Call MD for:  hives   Complete by: As directed    Call MD for:  persistant dizziness or light-headedness    Complete by: As directed    Call MD for:  persistant nausea and vomiting   Complete by: As directed    Call MD for:  redness, tenderness, or signs of infection (pain, swelling, redness, odor or green/yellow discharge around incision site)   Complete by: As directed    Call MD for:  severe uncontrolled pain   Complete by: As directed    Call MD for:  temperature >100.4   Complete by: As directed    Diet - low sodium heart healthy   Complete by: As directed    Discharge instructions   Complete by: As directed    You may resume taking your Plavix on Sunday.  It is important to move your head and neck through a full range of motion to avoid muscle tension headaches.  Please do this gently several times daily.   Discharge wound care:   Complete by: As directed    You may shower starting on Sunday.  Do not submerge the incision or allow to become saturated with water or sweat.  It is okay if it gets a little bit damp; just pat it dry gently with a clean soft towel.  Allow the Steri-Strips (paper tapes) to fall off on their own.  You may apply an ice pack wrapped in a thin towel as needed for swelling or discomfort.  Once the Steri-Strips fall off, do not apply any ointments or creams until seen in clinic.   Driving Restrictions   Complete by: As directed  No driving for 2 weeks.   Increase activity slowly   Complete by: As directed      Allergies as of 03/29/2021      Reactions   Celecoxib Shortness Of Breath   Pregabalin Shortness Of Breath   Buspar [buspirone] Other (See Comments)   Tachycardia   Citalopram Cough      Medication List    STOP taking these medications   Potassium 75 MG Tabs   potassium chloride SA 20 MEQ tablet Commonly known as: KLOR-CON     TAKE these medications   acetaminophen 500 MG tablet Commonly known as: TYLENOL Take 2 tablets (1,000 mg total) by mouth every 6 (six) hours.   atorvastatin 20 MG tablet Commonly known as: LIPITOR Take 1 tablet  (20 mg total) by mouth daily.   calcium carbonate 1250 (500 Ca) MG tablet Commonly known as: OS-CAL - dosed in mg of elemental calcium Take 2 tablets (1,000 mg of elemental calcium total) by mouth 3 (three) times daily with meals.   clopidogrel 75 MG tablet Commonly known as: PLAVIX Take 1 tablet (75 mg total) by mouth daily.   cyclobenzaprine 5 MG tablet Commonly known as: FLEXERIL Take 1 tablet (5 mg total) by mouth 3 (three) times daily as needed for muscle spasms.   furosemide 20 MG tablet Commonly known as: LASIX TAKE 2 TABLETS(40 MG) BY MOUTH DAILY AS NEEDED FOR FLUID RETENTION OR SWELLING What changed:   how much to take  how to take this  when to take this  additional instructions   gabapentin 100 MG capsule Commonly known as: NEURONTIN TAKE 2 CAPSULES THREE TIMES A DAY ALONG WITH 600 MG THREE TIMES A DAY FOR A TOTAL DAILY DOSE OF 2400 MG DAILY What changed:   how much to take  how to take this  when to take this   gabapentin 600 MG tablet Commonly known as: NEURONTIN TAKE 1 TABLET(600 MG) BY MOUTH THREE TIMES DAILY What changed: Another medication with the same name was changed. Make sure you understand how and when to take each.   glipiZIDE 10 MG tablet Commonly known as: GLUCOTROL TAKE ONE-HALF TABLET BY  MOUTH TWICE DAILY BEFORE  MEALS What changed:   how much to take  how to take this  when to take this  additional instructions   ibuprofen 600 MG tablet Commonly known as: ADVIL Take 1 tablet (600 mg total) by mouth every 6 (six) hours as needed for mild pain.   levothyroxine 150 MCG tablet Commonly known as: SYNTHROID Take 1 tablet (150 mcg total) by mouth daily at 6 (six) AM. Start taking on: March 30, 2021   lisinopril 5 MG tablet Commonly known as: ZESTRIL Take 1 tablet (5 mg total) by mouth daily.   Melatonin 10 MG Tabs Take 10 mg by mouth at bedtime.   metFORMIN 500 MG tablet Commonly known as: GLUCOPHAGE Take 1,000 mg by  mouth 2 (two) times daily with a meal.   nortriptyline 25 MG capsule Commonly known as: PAMELOR TAKE 2 CAPSULES BY MOUTH AT BEDTIME   omeprazole 20 MG capsule Commonly known as: PRILOSEC Take 1 capsule (20 mg total) by mouth daily.   oxyCODONE-acetaminophen 5-325 MG tablet Commonly known as: PERCOCET/ROXICET Take 1 tablet by mouth every 6 (six) hours as needed for severe pain.   traZODone 50 MG tablet Commonly known as: DESYREL TAKE 1 TO 2 TABLETS(50 TO 100 MG) BY MOUTH AT BEDTIME AS NEEDED FOR SLEEP What changed: See the  new instructions.   Tyler Aas FlexTouch 100 UNIT/ML FlexTouch Pen Generic drug: insulin degludec Inject into the skin daily.   triamterene-hydrochlorothiazide 37.5-25 MG tablet Commonly known as: MAXZIDE-25 Take 1 tablet by mouth daily.   Trulicity 1.5 ZO/1.0RU Sopn Generic drug: Dulaglutide Inject 1.5 mg into the skin once a week.            Discharge Care Instructions  (From admission, onward)         Start     Ordered   03/29/21 0000  Discharge wound care:       Comments: You may shower starting on Sunday.  Do not submerge the incision or allow to become saturated with water or sweat.  It is okay if it gets a little bit damp; just pat it dry gently with a clean soft towel.  Allow the Steri-Strips (paper tapes) to fall off on their own.  You may apply an ice pack wrapped in a thin towel as needed for swelling or discomfort.  Once the Steri-Strips fall off, do not apply any ointments or creams until seen in clinic.   03/29/21 0939           Signed: Fredirick Maudlin 03/29/2021, 9:41 AM

## 2021-03-29 NOTE — Discharge Instructions (Signed)
Post-operative Home Care After Thyroid or Parathyroid Surgery  What should I expect after my operation?   . You will see swelling under the incision and/or bruising under it in a few days. This is usually greatest on the second or third day after the operation. You may also feel the sensation of swelling and/or of firmness that can last for a month or more.    . Neck incisions heal rapidly, within a week or two. You can get them damp after about 24 hours, however do not submerge the incision or allow it to become soaked or saturated with water or sweat for 2 weeks.   . Your scar will be most visible 1-2 months after the operation and gradually fade over the next 6-8 months. As it heals, a scar looks more pink or red than the skin around it.   . You may feel a firm 'healing ridge' directly under the incision. This is normal and will soften and go away when healing is complete within 3-6 months.   . All incisions are sensitive to sunlight.  For one year after surgery, you should use sunscreen when outdoors for long periods to prevent darkening of the scar area.    . We recommend that you not expose the incision to the ultraviolet lights used in tanning booths.    Will my neck hurt?  . Most patients experience very little pain, but you may feel some neck stiffness/soreness in your shoulder, back or neck and tension headache that takes a few days or weeks to go away completely.    . Some patients also notice minor changes in swallowing, which improve over time.   . The skin just above and below your incision will feel numb. This will improve over several months, but some persons may have a longterm decrease in sensation.   .  You may apply cold pack over your incision to improve the pain.    How will I manage my pain at home?  . Take NSAIDS like ibuprofen (Motrin, Advil), naproxen (Naprosyn, Aleve) or acetaminophen (Tylenol) every 6 hours for the first 3-5 days after operation. This will  minimize the pain you feel.      ? To prevent Tylenol overdose, do not take a Tylenol doses at the same time as a combination narcotic dose that contains Tylenol, like Norco and Percocet.  However, You may take them 4-6 hours apart.     . If you have sore/stiff muscles in your back, shoulder or neck, you may use moist warm heat or heating pad on the affected areas 15-20 minutes at a time several times a day.   . Gently massaging your neck muscles will improve the neck stiffness.    . Do not be afraid to move your neck. Gently flexing and stretching your neck muscles will prevent stiffness.    . Stronger pain medication or narcotic (like Norco, Percocet) for severe pain is rarely needed. If it is, however, DO NOT drive a car or drink alcohol while taking these medications.    . Narcotics also cause constipation. Stool softeners (Colace) and fiber (fruits, bran, vegetables) and extra fluid intake helps. A stimulant laxative (Milk of Magnesia, Senokot) may be needed as well.    Will my voice be affected?  Your voice may be hoarse or weak at first, because the surgery took place near the voice box, but usually recovers within weeks. Some patients also notice a change in the pitch of their voices that   affects singing. Rarely, these changes can be permanent.   Are there any diet restrictions?  No, return to your previous diet and always eat a well balanced diet, low in fat, etc.    How will I care for my incision?  . If you have paper "steri strips" on your incision, leave them in place until they begin to fall off naturally. If they become discolored or messy, you may remove them 7-10 days after your operation.   . If you have a skin glue (Dermabond) closure, you may notice tiny pieces of yellow material on your washcloth. Any sutures (stitches) you may have are dissolvable and do not need to be removed.  . You may shower then gently pat dry your incision.   . Do not apply ointments or powders.    . Avoid using Vitamin E cream or other moisturizers on the incision until after your first follow-up visit.   What new medications might I take home?   ? Calcium supplement:  Your body's calcium levels may fall after a total thyroidectomy or parathyroid operation. We recommend you purchase Os-Cal 500 (one tablet equals 500 mg of elemental calcium) or Citracal (2 tablets equal 630 mg of elemental calcium; the "Petites" version contains 500 mg elemental calcium per 2 tablets). You may be taking 3-6 (or more) tablets per day, depending on your doctor's recommendation. You will need to take calcium at different times to avoid medication interaction. Ask your pharmacists, nurse, or doctor about specific interactions.   ? Thyroid Hormone:  If you have had a thyroid operation, you may be prescribed thyroid hormone replacement, called levothyroxine (Synthroid, Levothroid, etc.). A blood test will be done in 6-8 weeks to ensure the dosage is correct  by your doctor or your surgeon.   ? Vitamin D:  If your doctor has prescribed a Vitamin D supplement, like Calcitriol (Rocaltrol), try to get it filled at the hospital pharmacy before you leave.  Sometimes regular pharmacies do not stock it and will need to order it in.  When can I go back to normal activities?  . You may return to work in 5-7 days or sooner if desired. Contact the clinic coordinator if you need employer forms completed.   . You may drive as long as you are not taking any narcotics and your neck stiffness is resolved    Can I resume my previous medications?   . Yes, unless directed not to by your doctor.   . Before discharge, be sure to review your previous medications with your doctor or inpatient medical team.    When do I call for advice?   - If your temperature > 101.5  - You have trouble talking or breathing  - Your fingers/ hands or face or around your lips becomes numb and tingling. (This may mean your calcium level is low.)  -  You have trouble swallowing  - Your incision becomes swollen, red or drainage occurs.     Please start taking 2 tablets of either Os-Cal 500 or Citrical 3 times daily. You may use Tums, instead, but please be sure to get the "ultra" variety.  Calcium can be quite constipating, so be sure to drink at least 64 oz of water or other non-caffeinated beverage daily. You can still drink caffeine in addition to this.  If necessary, you may use over the counter stool softeners or fiber supplements to help with constipation.    

## 2021-03-31 ENCOUNTER — Encounter: Payer: Self-pay | Admitting: Family Medicine

## 2021-03-31 ENCOUNTER — Encounter: Payer: Self-pay | Admitting: General Surgery

## 2021-04-01 ENCOUNTER — Ambulatory Visit (INDEPENDENT_AMBULATORY_CARE_PROVIDER_SITE_OTHER)
Admission: EM | Admit: 2021-04-01 | Discharge: 2021-04-01 | Disposition: A | Discharge status: Other Acute Inpatient Hospital | Payer: 59 | Source: Home / Self Care | Attending: Family Medicine | Admitting: Family Medicine

## 2021-04-01 ENCOUNTER — Other Ambulatory Visit: Payer: Self-pay | Admitting: Surgery

## 2021-04-01 ENCOUNTER — Emergency Department
Admission: EM | Admit: 2021-04-01 | Discharge: 2021-04-01 | Disposition: A | Discharge status: Home / Self Care | Payer: 59 | Attending: Emergency Medicine | Admitting: Emergency Medicine

## 2021-04-01 ENCOUNTER — Other Ambulatory Visit: Payer: Self-pay

## 2021-04-01 ENCOUNTER — Telehealth: Payer: 59 | Admitting: Physician Assistant

## 2021-04-01 DIAGNOSIS — Z7902 Long term (current) use of antithrombotics/antiplatelets: Secondary | ICD-10-CM | POA: Insufficient documentation

## 2021-04-01 DIAGNOSIS — R202 Paresthesia of skin: Secondary | ICD-10-CM | POA: Insufficient documentation

## 2021-04-01 DIAGNOSIS — H538 Other visual disturbances: Secondary | ICD-10-CM | POA: Insufficient documentation

## 2021-04-01 DIAGNOSIS — E1129 Type 2 diabetes mellitus with other diabetic kidney complication: Secondary | ICD-10-CM | POA: Diagnosis not present

## 2021-04-01 DIAGNOSIS — R0789 Other chest pain: Secondary | ICD-10-CM

## 2021-04-01 DIAGNOSIS — E8989 Other postprocedural endocrine and metabolic complications and disorders: Secondary | ICD-10-CM

## 2021-04-01 DIAGNOSIS — E1140 Type 2 diabetes mellitus with diabetic neuropathy, unspecified: Secondary | ICD-10-CM | POA: Diagnosis not present

## 2021-04-01 DIAGNOSIS — Z7984 Long term (current) use of oral hypoglycemic drugs: Secondary | ICD-10-CM | POA: Diagnosis not present

## 2021-04-01 DIAGNOSIS — I10 Essential (primary) hypertension: Secondary | ICD-10-CM | POA: Insufficient documentation

## 2021-04-01 DIAGNOSIS — Z794 Long term (current) use of insulin: Secondary | ICD-10-CM | POA: Insufficient documentation

## 2021-04-01 DIAGNOSIS — Z79899 Other long term (current) drug therapy: Secondary | ICD-10-CM | POA: Diagnosis not present

## 2021-04-01 DIAGNOSIS — R079 Chest pain, unspecified: Secondary | ICD-10-CM | POA: Insufficient documentation

## 2021-04-01 LAB — TROPONIN I (HIGH SENSITIVITY): Troponin I (High Sensitivity): 4 ng/L (ref <18)

## 2021-04-01 LAB — CBC
HCT: 39.7 % (ref 36.0–46.0)
Hemoglobin: 13.5 g/dL (ref 12.0–15.0)
MCH: 28.3 pg (ref 26.0–34.0)
MCHC: 34 g/dL (ref 30.0–36.0)
MCV: 83.2 fL (ref 80.0–100.0)
Platelets: 365 10*3/uL (ref 150–400)
RBC: 4.77 MIL/uL (ref 3.87–5.11)
RDW: 12.1 % (ref 11.5–15.5)
WBC: 10.9 10*3/uL — ABNORMAL HIGH (ref 4.0–10.5)
nRBC: 0 % (ref 0.0–0.2)

## 2021-04-01 LAB — BASIC METABOLIC PANEL
Anion gap: 14 (ref 5–15)
BUN: 16 mg/dL (ref 6–20)
CO2: 30 mmol/L (ref 22–32)
Calcium: 6.2 mg/dL — CL (ref 8.9–10.3)
Chloride: 92 mmol/L — ABNORMAL LOW (ref 98–111)
Creatinine, Ser: 0.71 mg/dL (ref 0.44–1.00)
GFR, Estimated: >60 mL/min (ref >60)
Glucose, Bld: 215 mg/dL — ABNORMAL HIGH (ref 70–99)
Potassium: 4.1 mmol/L (ref 3.5–5.1)
Sodium: 136 mmol/L (ref 135–145)

## 2021-04-01 LAB — GLUCOSE, CAPILLARY: Glucose-Capillary: 251 mg/dL — ABNORMAL HIGH (ref 70–99)

## 2021-04-01 MED ORDER — CALCIUM CARBONATE 1250 (500 CA) MG PO TABS: 2.0000 | ORAL_TABLET | Freq: Three times a day (TID) | ORAL | 0 refills | Status: DC

## 2021-04-01 MED ORDER — CALCIUM GLUCONATE-NACL 2-0.675 GM/100ML-% IV SOLN
2.0000 g | Freq: Once | INTRAVENOUS | Status: AC
  Administered 2021-04-01: 2000 mg via INTRAVENOUS
  Filled 2021-04-01: qty 100

## 2021-04-01 NOTE — Progress Notes (Signed)
Based on what you shared with me, I feel your condition warrants further evaluation and I recommend that you be seen in a face to face office visit.   I would highly recommend you contact your surgeon, Dr. Celine Ahr with current issues to make sure no infection is present.   NOTE: If you entered your credit card information for this eVisit, you will not be charged. You may see a "hold" on your card for the $35 but that hold will drop off and you will not have a charge processed.   If you are having a true medical emergency please call 911.      For an urgent face to face visit, [place] has five urgent care centers for your convenience:     [facility] [place] [facility] [place] Get Driving Directions [phone] [place], [place] [address] . 10 am - 6pm Monday - Friday    [facility] [place] [facility]) Get Driving Directions [phone] [address] [place], [place] [address] . 10 am to 8 pm Monday-Friday . 12 pm to 8 pm [facility] [place] ([place]) Get Driving Directions [phone]  [address] [place],  [place]  [address] . 8 am to 8 pm Monday-Friday . 8 am to 4 pm [facility] Get Driving Directions [phone] [place], [place], [place] [address] . 8 am to 8 pm Monday-Friday . 9 am to 6 pm Saturday . 11 am to 6 pm Sunday   [facility] Get Driving Directions  [phone] [address] [place], [place] [address] . 8 am to 8 pm Monday-Friday . 8 am to 4 pm [facility] [place] Get Driving Directions [phone] [address] [place],  [address] . 12 pm to 6 pm Monday-Friday    VIDEO VISITS:  is now providing on-demand video visits for your convenience. Speak to one of our  providers from the comfort of your home 8 am to 8 pm or after hours through our partner vendor.   For Video Visit details and options:   [email or web address]      Your MyChart E-visit questionnaire answers were reviewed by a board certified advanced clinical practitioner to complete your personal care plan based on your specific symptoms.  Thank you for using e-Visits.   I have spent 7 minutes in review of e-visit questionnaire, review and updating patient chart, medical decision making and response to patient.   [name], PA-C

## 2021-04-01 NOTE — Discharge Instructions (Signed)
Go to the ER for further evaluation.  Good luck  Take care  Dr. Lacinda Axon

## 2021-04-01 NOTE — ED Provider Notes (Signed)
[facility]    CSN: [id number] Arrival date & time: 04/01/21  1135      History   Chief Complaint Chief Complaint  Patient presents with  . Chest Pain  . Facial Swelling   HPI  55 year old female presents with multiple complaints.  Started on Sunday.  Patient reports that she has had intermittent central chest pain which she describes as a pressure sensation.  She is also had blurred vision of her right eye as well as facial numbness and tingling.  She states that she has had essentially entire left-sided tingling that is worse than her baseline peripheral neuropathy.  She recently had a thyroidectomy.  Her primary care physician advised her to come in for evaluation.  Denies shortness of breath.  She states that her symptoms are intermittent.  No relieving factors.  No other complaints.  Past Medical History:  Diagnosis Date  . Arthritis    knees  . Blood clotting disorder (HCC)    on toe   . Degenerative disc disease, lumbar   . Diabetes mellitus without complication ([place])   . GERD (gastroesophageal reflux disease)   . Headache    daily - AM (has not been able to have SPG blocks lately)  . Hyperlipidemia   . Hypertension   . Neuropathy    feet  . Vertigo    3-4x/yr    Patient Active Problem List   Diagnosis Date Noted  . S/P total thyroidectomy 03/28/2021  . Thyroid nodule   . Blue toe syndrome of left lower extremity ([place]) 10/01/2020  . Type 2 diabetes mellitus with microalbuminuria, without long-term current use of insulin ([place]) 05/23/2020  . Cystitis 05/16/2020  . Schamberg's purpura 10/09/2019  . Leg swelling 10/09/2019  . Benign positional vertigo 02/03/2016  . Common migraine with intractable migraine 06/18/2015  . Anxiety 05/04/2015  . Adaptation reaction 05/03/2015  . DDD (degenerative disc disease), lumbosacral 05/03/2015  . Type 2 diabetes mellitus with diabetic neuropathy, without long-term current use of insulin ([place]) 05/03/2015  .  Hypercholesteremia 05/03/2015  . Insomnia due to medical condition 05/03/2015  . Adiposity 05/03/2015  . Disorder of peripheral nervous system 05/03/2015  . Snores 05/03/2015  . Diabetic neuropathy ([place]) 03/06/2015  . Essential hypertension 03/06/2015  . Elevated liver function tests 03/06/2015  . Obesity 03/06/2015  . GERD (gastroesophageal reflux disease) 03/06/2015  . Multinodular goiter 03/06/2015  . RLS (restless legs syndrome) 03/06/2015  . Leg cramps 03/06/2015    Past Surgical History:  Procedure Laterality Date  . ABDOMINAL HYSTERECTOMY     But still has cervix  . CESAREAN SECTION    . CHOLECYSTECTOMY    . left arm frature  10/12/2020  . OVARIAN CYST SURGERY    . PARATHYROIDECTOMY  03/28/2021   Procedure: PARATHYROIDECTOMY AUTOTRANSPLANT;  Surgeon: Fredirick Maudlin, MD;  Location: [facility] ORS;  Service: General;;  . SHOULDER SURGERY Left 12/26/014   Dr. Little Ishikawa, [facility]  . THYROIDECTOMY N/A 03/28/2021   Procedure: THYROIDECTOMY, total;  Surgeon: Fredirick Maudlin, MD;  Location: [facility] ORS;  Service: General;  Laterality: N/A;  Provider requesting 3 hours /180 minutes for procedure  . TONSILLECTOMY AND ADENOIDECTOMY    . UTERINE FIBROID SURGERY      OB History    Gravida  2   Para  1   Term      Preterm      AB  1   Living        SAB  1   IAB  Ectopic      Multiple      Live Births               Home Medications    Prior to Admission medications   Medication Sig Start Date End Date Taking? Authorizing Provider  acetaminophen (TYLENOL) 500 MG tablet Take 2 tablets (1,000 mg total) by mouth every 6 (six) hours. 03/29/21   Fredirick Maudlin, MD  atorvastatin (LIPITOR) 20 MG tablet Take 1 tablet (20 mg total) by mouth daily. 04/01/20   Mar Daring, PA-C  calcium carbonate (OS-CAL - DOSED IN MG OF ELEMENTAL CALCIUM) 1250 (500 Ca) MG tablet Take 2 tablets (1,000 mg of elemental calcium total) by mouth 3 (three) times daily with meals. 03/29/21    Fredirick Maudlin, MD  clopidogrel (PLAVIX) 75 MG tablet Take 1 tablet (75 mg total) by mouth daily. 09/05/20   Kris Hartmann, NP  cyclobenzaprine (FLEXERIL) 5 MG tablet Take 1 tablet (5 mg total) by mouth 3 (three) times daily as needed for muscle spasms. 05/22/20   Mar Daring, PA-C  Dulaglutide (TRULICITY) 1.5 IZ/1.2WP SOPN Inject 1.5 mg into the skin once a week. 09/27/20   Mar Daring, PA-C  furosemide (LASIX) 20 MG tablet TAKE 2 TABLETS(40 MG) BY MOUTH DAILY AS NEEDED FOR FLUID RETENTION OR SWELLING Patient taking differently: Take 40 mg by mouth daily. 04/01/20   Mar Daring, PA-C  gabapentin (NEURONTIN) 100 MG capsule TAKE 2 CAPSULES THREE TIMES A DAY ALONG WITH 600 MG THREE TIMES A DAY FOR A TOTAL DAILY DOSE OF 2400 MG DAILY Patient taking differently: Take 200 mg by mouth 3 (three) times daily. TAKE 2 CAPSULES THREE TIMES A DAY ALONG WITH 600 MG THREE TIMES A DAY FOR A TOTAL DAILY DOSE OF 2400 MG DAILY 04/01/20   Mar Daring, PA-C  gabapentin (NEURONTIN) 600 MG tablet TAKE 1 TABLET(600 MG) BY MOUTH THREE TIMES DAILY 04/14/20   Mar Daring, PA-C  glipiZIDE (GLUCOTROL) 10 MG tablet TAKE ONE-HALF TABLET BY  MOUTH TWICE DAILY BEFORE  MEALS Patient taking differently: Take 10 mg by mouth 2 (two) times daily before a meal. 11/25/20   Burnette, Clearnce Sorrel, PA-C  ibuprofen (ADVIL) 600 MG tablet Take 1 tablet (600 mg total) by mouth every 6 (six) hours as needed for mild pain. 03/29/21   Fredirick Maudlin, MD  insulin degludec (TRESIBA FLEXTOUCH) 100 UNIT/ML FlexTouch Pen Inject into the skin daily.    [provider]  levothyroxine (SYNTHROID) 150 MCG tablet Take 1 tablet (150 mcg total) by mouth daily at 6 (six) AM. 03/30/21   Fredirick Maudlin, MD  lisinopril (ZESTRIL) 5 MG tablet Take 1 tablet (5 mg total) by mouth daily. 05/23/20   Mar Daring, PA-C  Melatonin 10 MG TABS Take 10 mg by mouth at bedtime.    [provider]  metFORMIN  (GLUCOPHAGE) 500 MG tablet Take 1,000 mg by mouth 2 (two) times daily with a meal.    [provider]  nortriptyline (PAMELOR) 25 MG capsule TAKE 2 CAPSULES BY MOUTH AT BEDTIME 03/07/21   Mar Daring, PA-C  omeprazole (PRILOSEC) 20 MG capsule Take 1 capsule (20 mg total) by mouth daily. 04/01/20   Mar Daring, PA-C  oxyCODONE-acetaminophen (PERCOCET/ROXICET) 5-325 MG tablet Take 1 tablet by mouth every 6 (six) hours as needed for severe pain. 03/12/21   Mar Daring, PA-C  traZODone (DESYREL) 50 MG tablet TAKE 1 TO 2 TABLETS(50 TO 100 MG)  BY MOUTH AT BEDTIME AS NEEDED FOR SLEEP Patient taking differently: Take 50 mg by mouth at bedtime. 10/30/20   Mar Daring, PA-C  triamterene-hydrochlorothiazide (MAXZIDE-25) 37.5-25 MG tablet Take 1 tablet by mouth daily.    [provider]    Family History Family History  Problem Relation Age of Onset  . Diabetes Father   . Heart disease Father   . Hypertension Father   . Hyperlipidemia Father   . Congestive Heart Failure Father   . Healthy Brother   . Osteoporosis Mother   . Irritable bowel syndrome Mother   . Osteoporosis Maternal Grandmother     Social History Social History   Tobacco Use  . Smoking status: Former Smoker    Quit date: 2013    Years since quitting: 9.2  . Smokeless tobacco: Never Used  Vaping Use  . Vaping Use: Never used  Substance Use Topics  . Alcohol use: No    Alcohol/week: 0.0 standard drinks  . Drug use: Never     Allergies   Celecoxib, Pregabalin, Buspar [buspirone], and Citalopram   Review of Systems Review of Systems Per HPI  Physical Exam Triage Vital Signs ED Triage Vitals  Enc Vitals Group     BP 04/01/21 1150 136/85     Pulse Rate 04/01/21 1150 94     Resp 04/01/21 1150 16     Temp 04/01/21 1150 98.1 F (36.7 C)     Temp Source 04/01/21 1150 Oral     SpO2 04/01/21 1150 98 %     Weight 04/01/21 1149 215 lb (97.5 kg)     Height --       Head Circumference --      Peak Flow --      Pain Score 04/01/21 1149 0     Pain Loc --      Pain Edu? --      Excl. in [place]? --    Updated Vital Signs BP 136/85 (BP Location: Right Arm)   Pulse 94   Temp 98.1 F (36.7 C) (Oral)   Resp 16   Wt 97.5 kg   SpO2 98%   BMI 30.85 kg/m   Visual Acuity Right Eye Distance:   Left Eye Distance:   Bilateral Distance:    Right Eye Near:   Left Eye Near:    Bilateral Near:     Physical Exam Vitals and nursing note reviewed.  Constitutional:      General: She is not in acute distress.    Appearance: Normal appearance. She is not ill-appearing.  HENT:     Head: Normocephalic and atraumatic.  Eyes:     General:        Right eye: No discharge.        Left eye: No discharge.     Conjunctiva/sclera: Conjunctivae normal.  Cardiovascular:     Rate and Rhythm: Normal rate and regular rhythm.  Pulmonary:     Effort: Pulmonary effort is normal.     Breath sounds: Normal breath sounds. No wheezing, rhonchi or rales.  Abdominal:     General: There is no distension.     Palpations: Abdomen is soft.     Tenderness: There is no abdominal tenderness.  Neurological:     Mental Status: She is alert.  Psychiatric:        Mood and Affect: Mood normal.        Behavior: Behavior normal.    UC Treatments / Results  Labs (all labs  ordered are listed, but only abnormal results are displayed) Labs Reviewed  GLUCOSE, CAPILLARY - Abnormal; Notable for the following components:      Result Value   Glucose-Capillary 251 (*)    All other components within normal limits  CBG MONITORING, ED    EKG Interpretation: Normal sinus rhythm with rate of 88.  No ST or T wave changes.  Prolonged QT.  Radiology No results found.  Procedures Procedures (including critical care time)  Medications Ordered in UC Medications - No data to display  Initial Impression / Assessment and Plan / UC Course  I have reviewed the triage vital signs and the  nursing notes.  Pertinent labs & imaging results that were available during my care of the patient were reviewed by me and considered in my medical decision making (see chart for details).    55 year old female presents with multiple complaints.  Patient is experiencing atypical chest pain, blurred vision, and paresthesias.  Has had recent surgery.  She has multiple risk factors including uncontrolled diabetes.  I have advised her that she needs further evaluation in the ER.  Her husband is taking her via private vehicle.  Final Clinical Impressions(s) / UC Diagnoses   Final diagnoses:  Atypical chest pain  Blurred vision  Paresthesia     Discharge Instructions     Go to the ER for further evaluation.  Good luck  Take care  Dr. Lacinda Axon    ED Prescriptions    None     I have reviewed the PDMP during this encounter.   Coral Spikes, [place] 04/01/21 1346

## 2021-04-01 NOTE — ED Triage Notes (Signed)
Patient presents to Urgent Care with complaints of facial swelling, dizziness,  blurry vision in right eye, facial numbness and tingling, left sided tingling, intermittent chest pressure since Sunday. She states these symptoms started post surgery. She called her surgeon who instructed her to call her PCP. Her primary care doc instructed her to come in to urgent care for further eval. She states she has not had a BG check since she was dc from hospital on saturday.   Denies fever, SOB, no changes in speech.

## 2021-04-01 NOTE — ED Triage Notes (Signed)
Patient is being discharged from the Urgent Care and sent to the Emergency Department via POV . Per Dr Lacinda Axon, patient is in need of higher level of care due to symptoms. Patient is aware and verbalizes understanding of plan of care.  Vitals:   04/01/21 1150  BP: 136/85  Pulse: 94  Resp: 16  Temp: 98.1 F (36.7 C)  SpO2: 98%

## 2021-04-01 NOTE — Discharge Instructions (Signed)
Please begin calcium supplement prescription sent to [facility] by her surgeon today  Please return to the ER right away if you begin experiencing symptoms once again such as numbness, muscle weakness, cramping, or other new concerns arise.  Please follow-up this week with general surgery.

## 2021-04-01 NOTE — ED Provider Notes (Signed)
[facility] Emergency Department Provider Note  ____________________________________________   Event Date/Time   First MD Initiated Contact with Patient 04/01/21 1347     (approximate)  I have reviewed the triage vital signs and the nursing notes.   HISTORY  Chief Complaint Dizziness and Tingling    HPI Gabriella Marsh is a 55 y.o. female   here for evaluation of tingling and numb feeling  Patient reports that she has been experiencing a feeling of tingling and the numbness across both sides of her face and a slight blurriness in her vision since Sunday night.  Is also been feeling tingly.  She has chronic neuropathy in her extremities, but reports that the tingling and strange feeling on her face and slight blurriness of vision is new.  She also reports that she has a strange feeling across her chest like on the skin but denies actual chest pain rather reports he just feels like a strange feeling in her nerves  She had a recent thyroidectomy  She has been taking a calcium supplement she brought at a drugstore daily for the last couple of days  Past Medical History:  Diagnosis Date  . Arthritis    knees  . Blood clotting disorder (HCC)    on toe   . Degenerative disc disease, lumbar   . Diabetes mellitus without complication ([place])   . GERD (gastroesophageal reflux disease)   . Headache    daily - AM (has not been able to have SPG blocks lately)  . Hyperlipidemia   . Hypertension   . Neuropathy    feet  . Vertigo    3-4x/yr    Patient Active Problem List   Diagnosis Date Noted  . S/P total thyroidectomy 03/28/2021  . Thyroid nodule   . Blue toe syndrome of left lower extremity ([place]) 10/01/2020  . Type 2 diabetes mellitus with microalbuminuria, without long-term current use of insulin ([place]) 05/23/2020  . Cystitis 05/16/2020  . Schamberg's purpura 10/09/2019  . Leg swelling 10/09/2019  . Benign positional vertigo 02/03/2016  .  Common migraine with intractable migraine 06/18/2015  . Anxiety 05/04/2015  . Adaptation reaction 05/03/2015  . DDD (degenerative disc disease), lumbosacral 05/03/2015  . Type 2 diabetes mellitus with diabetic neuropathy, without long-term current use of insulin ([place]) 05/03/2015  . Hypercholesteremia 05/03/2015  . Insomnia due to medical condition 05/03/2015  . Adiposity 05/03/2015  . Disorder of peripheral nervous system 05/03/2015  . Snores 05/03/2015  . Diabetic neuropathy ([place]) 03/06/2015  . Essential hypertension 03/06/2015  . Elevated liver function tests 03/06/2015  . Obesity 03/06/2015  . GERD (gastroesophageal reflux disease) 03/06/2015  . Multinodular goiter 03/06/2015  . RLS (restless legs syndrome) 03/06/2015  . Leg cramps 03/06/2015    Past Surgical History:  Procedure Laterality Date  . ABDOMINAL HYSTERECTOMY     But still has cervix  . CESAREAN SECTION    . CHOLECYSTECTOMY    . left arm frature  10/12/2020  . OVARIAN CYST SURGERY    . PARATHYROIDECTOMY  03/28/2021   Procedure: PARATHYROIDECTOMY AUTOTRANSPLANT;  Surgeon: Fredirick Maudlin, MD;  Location: [facility] ORS;  Service: General;;  . SHOULDER SURGERY Left 12/26/014   Dr. Little Ishikawa, [facility]  . THYROIDECTOMY N/A 03/28/2021   Procedure: THYROIDECTOMY, total;  Surgeon: Fredirick Maudlin, MD;  Location: [facility] ORS;  Service: General;  Laterality: N/A;  Provider requesting 3 hours /180 minutes for procedure  . TONSILLECTOMY AND ADENOIDECTOMY    . UTERINE FIBROID SURGERY  Prior to Admission medications   Medication Sig Start Date End Date Taking? Authorizing Provider  acetaminophen (TYLENOL) 500 MG tablet Take 2 tablets (1,000 mg total) by mouth every 6 (six) hours. 03/29/21   Fredirick Maudlin, MD  atorvastatin (LIPITOR) 20 MG tablet Take 1 tablet (20 mg total) by mouth daily. 04/01/20   Mar Daring, PA-C  calcium carbonate (OS-CAL - DOSED IN MG OF ELEMENTAL CALCIUM) 1250 (500 Ca) MG tablet Take 2 tablets (1,000  mg of elemental calcium total) by mouth 3 (three) times daily with meals. 04/01/21   Olean Ree, MD  clopidogrel (PLAVIX) 75 MG tablet Take 1 tablet (75 mg total) by mouth daily. 09/05/20   Kris Hartmann, NP  cyclobenzaprine (FLEXERIL) 5 MG tablet Take 1 tablet (5 mg total) by mouth 3 (three) times daily as needed for muscle spasms. 05/22/20   Mar Daring, PA-C  Dulaglutide (TRULICITY) 1.5 OZ/3.6UY SOPN Inject 1.5 mg into the skin once a week. 09/27/20   Mar Daring, PA-C  furosemide (LASIX) 20 MG tablet TAKE 2 TABLETS(40 MG) BY MOUTH DAILY AS NEEDED FOR FLUID RETENTION OR SWELLING Patient taking differently: Take 40 mg by mouth daily. 04/01/20   Mar Daring, PA-C  gabapentin (NEURONTIN) 100 MG capsule TAKE 2 CAPSULES THREE TIMES A DAY ALONG WITH 600 MG THREE TIMES A DAY FOR A TOTAL DAILY DOSE OF 2400 MG DAILY Patient taking differently: Take 200 mg by mouth 3 (three) times daily. TAKE 2 CAPSULES THREE TIMES A DAY ALONG WITH 600 MG THREE TIMES A DAY FOR A TOTAL DAILY DOSE OF 2400 MG DAILY 04/01/20   Mar Daring, PA-C  gabapentin (NEURONTIN) 600 MG tablet TAKE 1 TABLET(600 MG) BY MOUTH THREE TIMES DAILY 04/14/20   Mar Daring, PA-C  glipiZIDE (GLUCOTROL) 10 MG tablet TAKE ONE-HALF TABLET BY  MOUTH TWICE DAILY BEFORE  MEALS Patient taking differently: Take 10 mg by mouth 2 (two) times daily before a meal. 11/25/20   [place], Clearnce Sorrel, PA-C  ibuprofen (ADVIL) 600 MG tablet Take 1 tablet (600 mg total) by mouth every 6 (six) hours as needed for mild pain. 03/29/21   Fredirick Maudlin, MD  insulin degludec (TRESIBA FLEXTOUCH) 100 UNIT/ML FlexTouch Pen Inject into the skin daily.    [provider]  levothyroxine (SYNTHROID) 150 MCG tablet Take 1 tablet (150 mcg total) by mouth daily at 6 (six) AM. 03/30/21   Fredirick Maudlin, MD  lisinopril (ZESTRIL) 5 MG tablet Take 1 tablet (5 mg total) by mouth daily. 05/23/20   Mar Daring, PA-C  Melatonin 10 MG  TABS Take 10 mg by mouth at bedtime.    [provider]  metFORMIN (GLUCOPHAGE) 500 MG tablet Take 1,000 mg by mouth 2 (two) times daily with a meal.    [provider]  nortriptyline (PAMELOR) 25 MG capsule TAKE 2 CAPSULES BY MOUTH AT BEDTIME 03/07/21   Mar Daring, PA-C  omeprazole (PRILOSEC) 20 MG capsule Take 1 capsule (20 mg total) by mouth daily. 04/01/20   Mar Daring, PA-C  oxyCODONE-acetaminophen (PERCOCET/ROXICET) 5-325 MG tablet Take 1 tablet by mouth every 6 (six) hours as needed for severe pain. 03/12/21   Mar Daring, PA-C  traZODone (DESYREL) 50 MG tablet TAKE 1 TO 2 TABLETS(50 TO 100 MG) BY MOUTH AT BEDTIME AS NEEDED FOR SLEEP Patient taking differently: Take 50 mg by mouth at bedtime. 10/30/20   Mar Daring, PA-C  triamterene-hydrochlorothiazide (MAXZIDE-25) 37.5-25 MG tablet Take 1  tablet by mouth daily.    [provider]    Allergies Celecoxib, Pregabalin, Buspar [buspirone], and Citalopram  Family History  Problem Relation Age of Onset  . Diabetes Father   . Heart disease Father   . Hypertension Father   . Hyperlipidemia Father   . Congestive Heart Failure Father   . Healthy Brother   . Osteoporosis Mother   . Irritable bowel syndrome Mother   . Osteoporosis Maternal Grandmother     Social History Social History   Tobacco Use  . Smoking status: Former Smoker    Quit date: 2013    Years since quitting: 9.2  . Smokeless tobacco: Never Used  Vaping Use  . Vaping Use: Never used  Substance Use Topics  . Alcohol use: No    Alcohol/week: 0.0 standard drinks  . Drug use: Never    Review of Systems Constitutional: No fever/chills Eyes: No visual changes except for a slight blurring of vision feeling. ENT: No sore throat. Cardiovascular: Denies chest pain. Respiratory: Denies shortness of breath. Gastrointestinal: No abdominal pain.   Genitourinary: Negative for dysuria. Musculoskeletal: Negative  for back pain. Skin: Negative for rash. Neurological: Negative for headaches.  Tingling and numb feeling across the face and upper chest.  Denies any new weakness.  No trouble speaking.  No facial droop.  Moving all arms and extremities as per her normal baseline    ____________________________________________   PHYSICAL EXAM:  VITAL SIGNS: ED Triage Vitals  Enc Vitals Group     BP 04/01/21 1249 111/77     Pulse Rate 04/01/21 1249 98     Resp 04/01/21 1249 18     Temp 04/01/21 1249 98.2 F (36.8 C)     Temp Source 04/01/21 1249 Oral     SpO2 04/01/21 1249 98 %     Weight 04/01/21 1247 215 lb (97.5 kg)     Height 04/01/21 1247 5\' 10"  (1.778 m)     Head Circumference --      Peak Flow --      Pain Score 04/01/21 1246 4     Pain Loc --      Pain Edu? --      Excl. in [place]? --     Constitutional: Alert and oriented. Well appearing and in no acute distress. Eyes: Conjunctivae are normal. Head: Atraumatic. Nose: No congestion/rhinnorhea. Mouth/Throat: Mucous membranes are moist. Neck: No stridor.  Upper chest thyroidectomy site appears clean dry intact no bleeding. Cardiovascular: Normal rate, regular rhythm. Grossly normal heart sounds.  Good peripheral circulation. Respiratory: Normal respiratory effort.  No retractions. Lungs CTAB. Gastrointestinal: Soft and nontender. No distention. Musculoskeletal: No lower extremity tenderness nor edema. Neurologic:  Normal speech and language. No gross focal neurologic deficits are appreciated.  No acute neurologic deficits.  Has diminished sensation in distal extremities, reports this is chronic due to neuropathy.  She also wears gloves on her hands as it is less irritating to her neuropathy.  Well perfused.  No tetany.  Normal mental status Skin:  Skin is warm, dry and intact. No rash noted. Psychiatric: Mood and affect are normal. Speech and behavior are normal.  ____________________________________________   LABS (all labs ordered  are listed, but only abnormal results are displayed)  Labs Reviewed  BASIC METABOLIC PANEL - Abnormal; Notable for the following components:      Result Value   Chloride 92 (*)    Glucose, Bld 215 (*)    Calcium 6.2 (*)    All  other components within normal limits  CBC - Abnormal; Notable for the following components:   WBC 10.9 (*)    All other components within normal limits  URINALYSIS, COMPLETE (UACMP) WITH MICROSCOPIC  CBG MONITORING, ED  TROPONIN I (HIGH SENSITIVITY)   ____________________________________________  EKG  History reviewed inter by me at 1300  ____________________________________________  RADIOLOGY   ____________________________________________   PROCEDURES  Procedure(s) performed: None  Procedures  Critical Care performed: Yes, see critical care note(s)  CRITICAL CARE Performed by: Delman Kitten   Total critical care time: 25 minutes  Critical care time was exclusive of separately billable procedures and treating other patients.  Critical care was necessary to treat or prevent imminent or life-threatening deterioration.  Critical care was time spent personally by me on the following activities: development of treatment plan with patient and/or surrogate as well as nursing, discussions with consultants, evaluation of patient's response to treatment, examination of patient, obtaining history from patient or surrogate, ordering and performing treatments and interventions, ordering and review of laboratory studies, ordering and review of radiographic studies, pulse oximetry and re-evaluation of patient's condition.  ____________________________________________   INITIAL IMPRESSION / ASSESSMENT AND PLAN / ED COURSE  Pertinent labs & imaging results that were available during my care of the patient were reviewed by me and considered in my medical decision making (see chart for details).   Patient had thyroidectomy, now having paresthesias and  neurologic symptoms.  Review labs indicate significant hypocalcemia.  This makes sense in the current setting of thyroidectomy.  She was supposed to be on a prescription calcium supplement, but it appears that she was not able to get that prescription for unclear reason.  I discussed with general surgery and they's provided her a new prescription to [facility], for Os-Cal.  She does not have any focal neurologic symptoms.  She has history of neuropathy that is at baseline.  Having received calcium gluconate her symptoms resolved.  Clinical Course as of 04/01/21 1604  Tue Apr 01, 2021  1414 Discussed with dr. Hampton Abbot. Will give calcium gluconate and he is sending in a Rx for Os-Cal TID. Patient and husband understanding.  [MQ]  1414 Dr. Hampton Abbot will make close outpatient follow-up and plan for repeat Ca lab on Thursday via his office.  [MQ]    Clinical Course User Index [MQ] Delman Kitten, MD   Patient reports complete resolution of symptoms after receiving calcium gluconate.  I have discussed plan and we have set up a follow-up plan with general surgery including repeat calcium lab on likely Thursday  Discussed with the patient and her husband return precautions, and she will go pick up new prescription for calcium which she will start today and follow-up closely with general surgery.  Return precautions and treatment recommendations and follow-up discussed with the patient who is agreeable with the plan.   ____________________________________________   FINAL CLINICAL IMPRESSION(S) / ED DIAGNOSES  Final diagnoses:  Hypocalcemia        Note:  This document was prepared using Dragon voice recognition software and may include unintentional dictation errors       Delman Kitten, MD 04/01/21 1646

## 2021-04-01 NOTE — ED Notes (Signed)
Critical Calcium 6.2, [place], MD aware.

## 2021-04-01 NOTE — ED Notes (Signed)
Patient is being discharged from the Urgent Care and sent to the Emergency Department via POV . Per Dr. Lacinda Axon, patient is in need of higher level of care due to numbness, dizziness. Patient is aware and verbalizes understanding of plan of care.  Vitals:   04/01/21 1150  BP: 136/85  Pulse: 94  Resp: 16  Temp: 98.1 F (36.7 C)  SpO2: 98%

## 2021-04-01 NOTE — ED Triage Notes (Signed)
Pt via POV from home. Pt c/o dizziness and R blurred vision on Sunday night. Pt c/o facial tingling and L arm tingling. LKW was 0700 this AM. Pt also c/o chest pressure that started this morning. Pt was sent over from [place]. Pt is A&Ox4 and NAD.

## 2021-04-02 ENCOUNTER — Encounter: Payer: Self-pay | Admitting: General Surgery

## 2021-04-02 ENCOUNTER — Other Ambulatory Visit: Payer: Self-pay

## 2021-04-02 ENCOUNTER — Telehealth: Payer: Self-pay | Admitting: General Surgery

## 2021-04-02 ENCOUNTER — Other Ambulatory Visit: Payer: Self-pay | Admitting: Surgery

## 2021-04-02 MED ORDER — CALCITRIOL 0.25 MCG PO CAPS: 0.2500 ug | ORAL_CAPSULE | Freq: Every day | ORAL | 0 refills | Status: DC

## 2021-04-02 MED ORDER — IBUPROFEN 600 MG PO TABS: 600.0000 mg | ORAL_TABLET | Freq: Four times a day (QID) | ORAL | 0 refills | Status: DC | PRN

## 2021-04-02 NOTE — Telephone Encounter (Signed)
Patient had thyroidectomy with Dr. Celine Ahr on 03/28/21.    Patient calls stating that she was in the ER yesterday due to low calcium.  She states that the ER gave her a big dose of calcium through IV.  She was informed to get Os-Cal but has not been able to find this anywhere.  She did get a OTC calcium supplement and today has taken 1200 mg.  She is starting with the same feeling prior to going to the ER yesterday with her face and lips buzzing and tingling.  Please call her as soon as possible.  Thank you.

## 2021-04-02 NOTE — Telephone Encounter (Signed)
Spoke with the patient and she has been taking Calcium 600mg , 2 pills three times a day. Today so far she has taken 2400mg  of calcium and is starting to have the tingling, buzzing in her face. I confered with Dr Hampton Abbot and we will send her in a prescription for Calcitriol 0.25 mcg to be taken once a day. She will get this right away and take one. She is to continue taking the 1200 mg of calcium three times a day as before. She will call us tomorrow afternoon is she is still having symptoms.

## 2021-04-03 ENCOUNTER — Other Ambulatory Visit: Payer: Self-pay

## 2021-04-03 ENCOUNTER — Other Ambulatory Visit
Admission: RE | Admit: 2021-04-03 | Discharge: 2021-04-03 | Disposition: A | Discharge status: Home / Self Care | Payer: 59 | Attending: Surgery | Admitting: Surgery

## 2021-04-03 LAB — SURGICAL PATHOLOGY

## 2021-04-03 LAB — CALCIUM: Calcium: 7.3 mg/dL — ABNORMAL LOW (ref 8.9–10.3)

## 2021-04-03 NOTE — Addendum Note (Signed)
Addended by: Lesly Rubenstein on: 04/03/2021 08:57 AM   Modules accepted: Orders

## 2021-04-06 ENCOUNTER — Other Ambulatory Visit: Payer: Self-pay | Admitting: Physician Assistant

## 2021-04-06 DIAGNOSIS — R809 Proteinuria, unspecified: Secondary | ICD-10-CM

## 2021-04-06 DIAGNOSIS — E1129 Type 2 diabetes mellitus with other diabetic kidney complication: Secondary | ICD-10-CM

## 2021-04-06 NOTE — Telephone Encounter (Signed)
Requested Prescriptions  Pending Prescriptions Disp Refills  . TRULICITY 1.5 OT/1.5BW SOPN [Pharmacy Med Name: TRULICITY 1.5MG /0.5ML SDP 0.5ML] 2 mL 1    Sig: ADMINISTER 1.5 MG UNDER THE SKIN 1 TIME A WEEK     Endocrinology:  Diabetes - GLP-1 Receptor Agonists Failed - 04/06/2021  3:13 AM      Failed - HBA1C is between 0 and 7.9 and within 180 days    Hgb A1c MFr Bld  Date Value Ref Range Status  03/05/2021 9.4 (H) 4.8 - 5.6 % Final    Comment:             Prediabetes: 5.7 - 6.4          Diabetes: >6.4          Glycemic control for adults with diabetes: <7.0          Passed - Valid encounter within last 6 months    Recent Outpatient Visits          1 month ago Pre-op exam   [place], PA-C   3 months ago Annual physical exam   [facility] [name] M, PA-C   6 months ago Type 2 diabetes mellitus with microalbuminuria, without long-term current use of insulin [facility])   [place], [place], [place]   10 months ago Possible urinary tract infection   [facility] [place], [place], [place]   10 months ago Cystitis   [facility] [place], Dionne Bucy, MD

## 2021-04-07 ENCOUNTER — Encounter: Payer: Self-pay | Admitting: Family Medicine

## 2021-04-07 ENCOUNTER — Encounter: Payer: Self-pay | Admitting: Physician Assistant

## 2021-04-07 ENCOUNTER — Other Ambulatory Visit
Admission: RE | Admit: 2021-04-07 | Discharge: 2021-04-07 | Disposition: A | Discharge status: Home / Self Care | Payer: 59 | Attending: Surgery | Admitting: Surgery

## 2021-04-07 ENCOUNTER — Other Ambulatory Visit: Payer: Self-pay

## 2021-04-07 ENCOUNTER — Ambulatory Visit (INDEPENDENT_AMBULATORY_CARE_PROVIDER_SITE_OTHER): Payer: 59 | Admitting: Physician Assistant

## 2021-04-07 VITALS — BP 122/77 | HR 93 | Temp 97.7°F | Ht 70.0 in | Wt 218.6 lb

## 2021-04-07 DIAGNOSIS — Z09 Encounter for follow-up examination after completed treatment for conditions other than malignant neoplasm: Secondary | ICD-10-CM

## 2021-04-07 DIAGNOSIS — M5137 Other intervertebral disc degeneration, lumbosacral region: Secondary | ICD-10-CM

## 2021-04-07 DIAGNOSIS — E041 Nontoxic single thyroid nodule: Secondary | ICD-10-CM

## 2021-04-07 LAB — CALCIUM: Calcium: 8.7 mg/dL — ABNORMAL LOW (ref 8.9–10.3)

## 2021-04-07 NOTE — Patient Instructions (Addendum)
Please have your calcium checked today at the hospital. If you have any concerns or questions, please feel free to call our office. See follow up appointment below.

## 2021-04-07 NOTE — Progress Notes (Signed)
[facility] POST-OP OFFICE VISIT  04/07/2021  HPI: Gabriella Marsh is a 55 y.o. female 10 days s/p total thyroidectomy and parathyroid autotransplant x1 for suspicious thyroid biopsy with Dr Celine Ahr   Unfortunately, on POD4 she presented to the ED secondary to numbness and tingling in her face. She was found to have calcium of 6.2 and was given IV calcium gluconate which improved her calcium to 7.3. She was also started on 0.25 mcg of Calcitrol on 04/13 along with her prescribed post-operative dose of Os-Cal.   Since that time, she reports she is doing much better. Her increase in tingling and numbness from her baseline neuropathy have resolved. No fever, chills, nausea, emesis, trouble swallowing, or voice changes. No issues with her incisions. She continues to take Calcitrol and Os-Cal as prescribed. She did NOT have her calcium drawn before her appointment today.   Vital signs: BP 122/77   Pulse 93   Temp 97.7 F (36.5 C) (Oral)   Ht 5\' 10"  (1.778 m)   Wt 218 lb 9.6 oz (99.2 kg)   SpO2 96%   BMI 31.37 kg/m    Physical Exam: Constitutional: Well appearing female, NAD Pulmonary: Normal respiratory effort, normal phonation Skin: Incision to the anterior neck is CDI with steri-strips, no erythema or drainage   Assessment/Plan: This is a 55 y.o. female 10 days s/p total thyroidectomy and parathyroid autotransplant x1 for suspicious thyroid biopsy   - I will have her go to the hospital after her appointment and have her calcium drawn  - Continue current calcium regiment for now; adjust according to calcium results  - Continue Synthroid as prescribed  - Reviewed wound care; she prefers to leave steri-strips on for now   - Reviewed surgical pathology:       DIAGNOSIS:    A. THYROID GLAND; TOTAL THYROIDECTOMY:    - BENIGN THYROID GLAND WITH MULTINODULAR GOITER.    - TWO DOMINANT ADENOMATOID NODULES (3.3 CM ON RIGHT, 2.5 CM ON LEFT).    - TWO FRAGMENTS OF  PARATHYROID TISSUE PRESENT (BOTH RIGHT SIDED).    - ONE BENIGN LYMPH NODE.    - NO EVIDENCE OF MALIGNANCY.     - She will rtc next week with Dr Celine Ahr  -- Edison Simon, PA-C [facility] 04/07/2021, 1:48 PM [phone] M-F: 7am - 4pm

## 2021-04-08 MED ORDER — OXYCODONE-ACETAMINOPHEN 5-325 MG PO TABS
1.0000 | ORAL_TABLET | Freq: Four times a day (QID) | ORAL | 0 refills | Status: DC | PRN
Start: 2021-04-08 — End: 2021-05-12

## 2021-04-09 ENCOUNTER — Telehealth: Payer: Self-pay | Admitting: General Surgery

## 2021-04-09 NOTE — Telephone Encounter (Signed)
Spoke with patient. Symptoms of hypocalcemia have resolved.  Doing well with current regimen.  Pathology reviewed--benign. Will see her on Tuesday, as scheduled.

## 2021-04-10 ENCOUNTER — Other Ambulatory Visit: Payer: Self-pay | Admitting: Physician Assistant

## 2021-04-10 DIAGNOSIS — E1140 Type 2 diabetes mellitus with diabetic neuropathy, unspecified: Secondary | ICD-10-CM

## 2021-04-14 NOTE — Telephone Encounter (Signed)
Notes to clinic:  medication filled by a historical provider  Review for refill    Requested Prescriptions  Pending Prescriptions Disp Refills   triamterene-hydrochlorothiazide (MAXZIDE-25) 37.5-25 MG tablet [Pharmacy Med Name: Triamterene-HCTZ 37.5-25 MG Oral Tablet] 90 tablet 3    Sig: TAKE 1 TABLET BY MOUTH  DAILY      Cardiovascular: Diuretic Combos Failed - 04/10/2021  9:10 PM      Failed - Ca in normal range and within 360 days    Calcium  Date Value Ref Range Status  04/07/2021 8.7 (L) 8.9 - 10.3 mg/dL Final    Comment:    Performed at [facility], [address] [place], [place] [address]          Passed - K in normal range and within 360 days    Potassium  Date Value Ref Range Status  04/01/2021 4.1 3.5 - 5.1 mmol/L Final          Passed - Na in normal range and within 360 days    Sodium  Date Value Ref Range Status  04/01/2021 136 135 - 145 mmol/L Final  03/05/2021 136 134 - 144 mmol/L Final          Passed - Cr in normal range and within 360 days    Creatinine, Ser  Date Value Ref Range Status  04/01/2021 0.71 0.44 - 1.00 mg/dL Final   Creatinine,U  Date Value Ref Range Status  06/05/2015 194.5 mg/dL Final          Passed - Last BP in normal range    BP Readings from Last 1 Encounters:  04/07/21 122/77          Passed - Valid encounter within last 6 months    Recent Outpatient Visits           1 month ago Pre-op exam   [facility] [place], Clearnce Sorrel, PA-C   3 months ago Annual physical exam   [facility] [name] M, PA-C   6 months ago Type 2 diabetes mellitus with microalbuminuria, without long-term current use of insulin [facility])   [facility] [place], [place], [place]   10 months ago Possible urinary tract infection   [facility] [place], [place], [place]   11 months ago [place], Dionne Bucy, MD                  Signed Prescriptions Disp Refills   gabapentin (NEURONTIN) 600 MG tablet 30 tablet 0    Sig: TAKE 1 TABLET BY MOUTH 3  TIMES DAILY      Neurology: Anticonvulsants - gabapentin Passed - 04/10/2021  9:10 PM      Passed - Valid encounter within last 12 months    Recent Outpatient Visits           1 month ago Pre-op exam   [place], PA-C   3 months ago Annual physical exam   [facility] [name] M, PA-C   6 months ago Type 2 diabetes mellitus with microalbuminuria, without long-term current use of insulin [facility])   [place], [place], [place]   10 months ago Possible urinary tract infection   [facility] [place], [place], [place]   11 months ago Cystitis   [facility] [place], Dionne Bucy, MD

## 2021-04-15 ENCOUNTER — Other Ambulatory Visit: Payer: Self-pay

## 2021-04-15 ENCOUNTER — Encounter: Payer: Self-pay | Admitting: General Surgery

## 2021-04-15 ENCOUNTER — Ambulatory Visit (INDEPENDENT_AMBULATORY_CARE_PROVIDER_SITE_OTHER): Payer: 59 | Admitting: General Surgery

## 2021-04-15 VITALS — BP 142/90 | HR 98 | Temp 98.4°F | Ht 70.0 in | Wt 220.0 lb

## 2021-04-15 DIAGNOSIS — E041 Nontoxic single thyroid nodule: Secondary | ICD-10-CM

## 2021-04-15 NOTE — Patient Instructions (Addendum)
Take your thyroid medication first thing and then wait 30 minutes to take any other medications.   May rub Vitamin-E oil or other emmolient agent in area 2-3 times a day to soften. You will need to use sunscreen for the next year on the area to minimize altered pigmentation of the site.  Starting next week start decreasing your Calcium, 1 tablet am, 2 noon, 2 pm for one week. The next week decrease to 1 tablet at am, noon, and 2 tablets at pm. The next week decrease to 1 tablet at am, noon, and pm. The next week stop the am tablet, and so on for the next two weeks.  TSH, Free T4, and Free T3 in 6 weeks(around June 6th) at [facility], [facility] entrance.   We will call you about your results. Please call if you start to have any low calcium symptoms.

## 2021-04-15 NOTE — Progress Notes (Signed)
Gabriella Marsh is here today for a postoperative visit.  She is a 55 year old woman who underwent total thyroidectomy on March 28, 2021.  She did well postoperatively and was discharged home the following day.  She unfortunately developed symptomatic hypocalcemia and was seen in the emergency department on April 01, 2021.  It sounds like she had not been able to get the prescribed calcium and calcitriol.  This was rectified by one of my partners, as I was away from the office.  She was seen by our physicians assistant on April 18 and her calcium level was checked.  It had significantly improved.  I called her upon my return to the office, on April 20 and confirmed that she was doing better.  She was no longer experiencing any symptoms of hypocalcemia.  She has been taking 2 tablets of Os-Cal 500 three times daily as well as calcitriol 0.25 mcg daily.  She states that she is doing well with her thyroid hormone replacement.  Today, she reports that there have been no further incidents.  Her voice is essentially normal and she has been able to resume singing.  No difficulty swallowing but she does notice a bit of a tickle in her throat.  No issues with pain control, in fact she feels like her chronic total body pain has actually improved since her operation.  Today's Vitals   04/15/21 1431  BP: (!) 142/90  Pulse: 98  Temp: 98.4 F (36.9 C)  SpO2: 97%  Weight: 220 lb (99.8 kg)  Height: 5\' 10"  (1.778 m)   Body mass index is 31.57 kg/m. Focused examination of the neck demonstrates that the Steri-Strips are no longer present.  There is a slight skin separation at the left lateral aspect of the incision.  Otherwise the remainder of the site is well approximated without any erythema, induration, or drainage.  Final pathology:   Specimen Submitted:  A. Thyroid   Clinical History: suspicious thyroid nodule     DIAGNOSIS:  A. THYROID GLAND; TOTAL THYROIDECTOMY:  - BENIGN THYROID GLAND WITH  MULTINODULAR GOITER.  - TWO DOMINANT ADENOMATOID NODULES (3.3 CM ON RIGHT, 2.5 CM ON LEFT).  - TWO FRAGMENTS OF PARATHYROID TISSUE PRESENT (BOTH RIGHT SIDED).  - ONE BENIGN LYMPH NODE.  - NO EVIDENCE OF MALIGNANCY.   Impression and plan: This is a 55 year old woman status post total thyroidectomy for suspicious thyroid nodule.  She did require autotransplantation of 1 parathyroid gland and it appears that 2 of the right sided glands were at least partially fragmented.  She had symptomatic hypocalcemia, but has now recovered.  Overall, she is doing well.  I have recommended a very slow taper of her calcium supplementation basically by 1 tablet/day/week until she is down to 1 tablet daily which she could remain on for bone health if desired or discontinue it entirely.  She has enough calcitriol to get through another 2 weeks, which I think should be adequate.  I discussed the proper way to take thyroid medication, namely first thing in the morning on an empty stomach.  She should wait at least 30 minutes, and preferably an hour before taking any other medications, particular multivitamins, antibiotics, acid reducing agents, or her calcium supplement.  We also discussed scar care, including massage with a topical emollient agent to soften, flatten, and help the scar fade.  She should also apply sunscreen for about the next year to minimize altered pigmentation of the scar.  We will check labs in about 6 weeks  to confirm that the dose that I have prescribed is adequate or make appropriate adjustments.  After that, she can follow with her primary care provider.  I will plan to see her on an as-needed basis.

## 2021-04-28 ENCOUNTER — Other Ambulatory Visit: Payer: Self-pay | Admitting: Physician Assistant

## 2021-04-28 DIAGNOSIS — G2581 Restless legs syndrome: Secondary | ICD-10-CM

## 2021-04-28 DIAGNOSIS — E1140 Type 2 diabetes mellitus with diabetic neuropathy, unspecified: Secondary | ICD-10-CM

## 2021-04-29 ENCOUNTER — Other Ambulatory Visit: Payer: Self-pay | Admitting: Surgery

## 2021-04-29 MED ORDER — GABAPENTIN 600 MG PO TABS: 600.0000 mg | ORAL_TABLET | Freq: Three times a day (TID) | ORAL | 0 refills | Status: DC

## 2021-04-29 MED ORDER — NORTRIPTYLINE HCL 25 MG PO CAPS: 50.0000 mg | ORAL_CAPSULE | Freq: Every day | ORAL | 0 refills | Status: DC

## 2021-04-30 ENCOUNTER — Other Ambulatory Visit: Payer: Self-pay | Admitting: Physician Assistant

## 2021-04-30 DIAGNOSIS — F5101 Primary insomnia: Secondary | ICD-10-CM

## 2021-05-09 ENCOUNTER — Other Ambulatory Visit: Payer: Self-pay | Admitting: Family Medicine

## 2021-05-09 DIAGNOSIS — R809 Proteinuria, unspecified: Secondary | ICD-10-CM

## 2021-05-09 DIAGNOSIS — E1129 Type 2 diabetes mellitus with other diabetic kidney complication: Secondary | ICD-10-CM

## 2021-05-12 ENCOUNTER — Other Ambulatory Visit: Payer: Self-pay | Admitting: Family Medicine

## 2021-05-12 DIAGNOSIS — M5137 Other intervertebral disc degeneration, lumbosacral region: Secondary | ICD-10-CM

## 2021-05-13 MED ORDER — OXYCODONE-ACETAMINOPHEN 5-325 MG PO TABS: 1.0000 | ORAL_TABLET | Freq: Four times a day (QID) | ORAL | 0 refills | Status: DC | PRN

## 2021-05-13 NOTE — Telephone Encounter (Signed)
One refill given. Neds f/u appt. Offer pain management referral, as I do not manage chronic narcotics.

## 2021-05-26 ENCOUNTER — Telehealth: Payer: Self-pay

## 2021-05-26 NOTE — Telephone Encounter (Signed)
Message left for the patient reminding her that she is due for repeat lab work this week at [facility], [facility] entrance. She may call with any questions.

## 2021-05-27 ENCOUNTER — Telehealth: Payer: Self-pay

## 2021-05-27 ENCOUNTER — Encounter: Payer: Self-pay | Admitting: Family Medicine

## 2021-05-27 NOTE — Telephone Encounter (Signed)
Pt's husband dropped off FMLA form for pt. Husband was advised Dr. B is out of the office for the next 2 weeks. Forms placed in Dr. Sharmaine Base box and blank copy is on file with medical records. TNP

## 2021-06-05 NOTE — Telephone Encounter (Signed)
Patient has made contact requesting an update on the completion of their forms   Please contact to advise further

## 2021-06-10 NOTE — Telephone Encounter (Signed)
Pt advised completed forms were faxed 06/09/21.

## 2021-06-11 ENCOUNTER — Other Ambulatory Visit: Payer: Self-pay | Admitting: Physician Assistant

## 2021-06-11 DIAGNOSIS — E1129 Type 2 diabetes mellitus with other diabetic kidney complication: Secondary | ICD-10-CM

## 2021-06-11 DIAGNOSIS — R809 Proteinuria, unspecified: Secondary | ICD-10-CM

## 2021-06-12 ENCOUNTER — Encounter: Payer: Self-pay | Admitting: Family Medicine

## 2021-06-16 ENCOUNTER — Encounter: Payer: Self-pay | Admitting: Family Medicine

## 2021-06-16 ENCOUNTER — Other Ambulatory Visit: Payer: Self-pay | Admitting: Family Medicine

## 2021-06-16 DIAGNOSIS — M5137 Other intervertebral disc degeneration, lumbosacral region: Secondary | ICD-10-CM

## 2021-06-16 MED ORDER — OXYCODONE-ACETAMINOPHEN 5-325 MG PO TABS: 1.0000 | ORAL_TABLET | Freq: Four times a day (QID) | ORAL | 0 refills | Status: DC | PRN

## 2021-06-16 NOTE — Telephone Encounter (Signed)
Requested medication (s) are due for refill today: Yes  Requested medication (s) are on the active medication list: Yes  Last refill:  3 months ago  Future visit scheduled: Yes  Notes to clinic:  Unable to refill per protocol, last refill by another provider.      Requested Prescriptions  Pending Prescriptions Disp Refills   metFORMIN (GLUCOPHAGE) 500 MG tablet      Sig: Take 2 tablets (1,000 mg total) by mouth 2 (two) times daily with a meal.      Endocrinology:  Diabetes - Biguanides Failed - 06/16/2021  5:33 PM      Failed - HBA1C is between 0 and 7.9 and within 180 days    Hgb A1c MFr Bld  Date Value Ref Range Status  03/05/2021 9.4 (H) 4.8 - 5.6 % Final    Comment:             Prediabetes: 5.7 - 6.4          Diabetes: >6.4          Glycemic control for adults with diabetes: <7.0           Passed - Cr in normal range and within 360 days    Creatinine, Ser  Date Value Ref Range Status  04/01/2021 0.71 0.44 - 1.00 mg/dL Final   Creatinine,U  Date Value Ref Range Status  06/05/2015 194.5 mg/dL Final          Passed - eGFR in normal range and within 360 days    GFR calc Af Amer  Date Value Ref Range Status  01/03/2021 92 >59 mL/min/1.73 Final    Comment:    **In accordance with recommendations from the NKF-ASN Task force,**   [facility] is in the process of updating its eGFR calculation to the   2021 CKD-EPI creatinine equation that estimates kidney function   without a race variable.    GFR, Estimated  Date Value Ref Range Status  04/01/2021 >60 >60 mL/min Final    Comment:    (NOTE) Calculated using the CKD-EPI Creatinine Equation (2021)    GFR  Date Value Ref Range Status  06/05/2015 111.18 >60.00 mL/min Final   eGFR  Date Value Ref Range Status  03/05/2021 106 >59 mL/min/1.73 Final          Passed - Valid encounter within last 6 months    Recent Outpatient Visits           3 months ago Pre-op exam   [place], PA-C   5 months ago Annual physical exam   [facility] [name] M, PA-C   8 months ago Type 2 diabetes mellitus with microalbuminuria, without long-term current use of insulin [facility])   [facility] [place], Clearnce Sorrel, [place]   1 year ago Possible urinary tract infection   [facility], Clearnce Sorrel, [place]   1 year ago Cystitis   [facility] [place], Dionne Bucy, MD

## 2021-06-16 NOTE — Telephone Encounter (Signed)
Oxycodone already sent by PCP to [facility] on 06/16/21.

## 2021-06-16 NOTE — Telephone Encounter (Signed)
Medication Refill - Medication: oxyCODONE-acetaminophen (PERCOCET/ROXICET) 5-325 MG tablet  Has the patient contacted their pharmacy? Yes.   (Agent: If no, request that the patient contact the pharmacy for the refill.) (Agent: If yes, when and what did the pharmacy advise?)  Preferred Pharmacy (with phone number or street name):  [facility] [place], [place] - [address]  [place] [address]  Phone: [phone] Fax: [phone]    Agent: Please be advised that RX refills may take up to 3 business days. We ask that you follow-up with your pharmacy.     Medication Refill - Medication: metFORMIN (GLUCOPHAGE) 500 MG tablet   Has the patient contacted their pharmacy? Yes.   (Agent: If no, request that the patient contact the pharmacy for the refill.) (Agent: If yes, when and what did the pharmacy advise?)  Preferred Pharmacy (with phone number or street name):  [facility]  ([place]) - [place], [address]  [address] [place] [address]  Phone: [phone] Fax: [phone]    Agent: Please be advised that RX refills may take up to 3 business days. We ask that you follow-up with your pharmacy.

## 2021-06-16 NOTE — Telephone Encounter (Signed)
Last refill was 05/13/21. Please review. Thanks!

## 2021-06-16 NOTE — Telephone Encounter (Signed)
Ok to fill Metformin. Needs to schedule f/u appt

## 2021-06-17 MED ORDER — METFORMIN HCL 500 MG PO TABS
1000.0000 mg | ORAL_TABLET | Freq: Two times a day (BID) | ORAL | 0 refills | Status: DC
Start: 2021-06-17 — End: 2021-11-04

## 2021-07-05 ENCOUNTER — Other Ambulatory Visit: Payer: Self-pay | Admitting: Family Medicine

## 2021-07-05 DIAGNOSIS — E1140 Type 2 diabetes mellitus with diabetic neuropathy, unspecified: Secondary | ICD-10-CM

## 2021-07-05 NOTE — Telephone Encounter (Signed)
Requested Prescriptions  Pending Prescriptions Disp Refills  . gabapentin (NEURONTIN) 600 MG tablet [Pharmacy Med Name: Gabapentin 600 MG Oral Tablet] 270 tablet 1    Sig: TAKE 1 TABLET BY MOUTH 3  TIMES DAILY     Neurology: Anticonvulsants - gabapentin Passed - 07/05/2021  5:57 AM      Passed - Valid encounter within last 12 months    Recent Outpatient Visits          4 months ago Pre-op exam   [facility] [name] M, PA-C   6 months ago Annual physical exam   [facility] [name] M, [place]   9 months ago Type 2 diabetes mellitus with microalbuminuria, without long-term current use of insulin [facility])   [facility] [place], Clearnce Sorrel, [place]   1 year ago Possible urinary tract infection   [facility], Clearnce Sorrel, [place]   1 year ago Cystitis   [facility], Dionne Bucy, MD

## 2021-07-14 ENCOUNTER — Encounter: Payer: Self-pay | Admitting: Family Medicine

## 2021-07-15 ENCOUNTER — Ambulatory Visit: Payer: 59 | Admitting: Family Medicine

## 2021-07-15 NOTE — Progress Notes (Deleted)
Established patient visit   Patient: Gabriella Marsh   DOB: January 02, 1966   55 y.o. Female  MRN: [id number] Visit Date: 07/15/2021  Today's healthcare provider: Vernie Murders, PA-C   No chief complaint on file.  Subjective    HPI  ***  Patient Active Problem List   Diagnosis Date Noted   S/P total thyroidectomy 03/28/2021   Thyroid nodule    Blue toe syndrome of left lower extremity ([place]) 10/01/2020   Type 2 diabetes mellitus with microalbuminuria, without long-term current use of insulin ([place]) 05/23/2020   Cystitis 05/16/2020   Schamberg's purpura 10/09/2019   Leg swelling 10/09/2019   Benign positional vertigo 02/03/2016   Common migraine with intractable migraine 06/18/2015   Anxiety 05/04/2015   Adaptation reaction 05/03/2015   DDD (degenerative disc disease), lumbosacral 05/03/2015   Type 2 diabetes mellitus with diabetic neuropathy, without long-term current use of insulin ([place]) 05/03/2015   Hypercholesteremia 05/03/2015   Insomnia due to medical condition 05/03/2015   Adiposity 05/03/2015   Disorder of peripheral nervous system 05/03/2015   Snores 05/03/2015   Diabetic neuropathy ([place]) 03/06/2015   Essential hypertension 03/06/2015   Elevated liver function tests 03/06/2015   Obesity 03/06/2015   GERD (gastroesophageal reflux disease) 03/06/2015   Multinodular goiter 03/06/2015   RLS (restless legs syndrome) 03/06/2015   Leg cramps 03/06/2015   Past Medical History:  Diagnosis Date   Arthritis    knees   Blood clotting disorder ([place])    on toe    Degenerative disc disease, lumbar    Diabetes mellitus without complication ([place])    GERD (gastroesophageal reflux disease)    Headache    daily - AM (has not been able to have SPG blocks lately)   Hyperlipidemia    Hypertension    Neuropathy    feet   Vertigo    3-4x/yr   Allergies  Allergen Reactions   Celecoxib Shortness Of Breath   Pregabalin Shortness Of Breath   Buspar [Buspirone]  Other (See Comments)    Tachycardia   Citalopram Cough       Medications: Outpatient Medications Prior to Visit  Medication Sig   atorvastatin (LIPITOR) 20 MG tablet Take 1 tablet (20 mg total) by mouth daily.   calcitRIOL (ROCALTROL) 0.25 MCG capsule Take 1 capsule (0.25 mcg total) by mouth daily.   calcium carbonate (OS-CAL - DOSED IN MG OF ELEMENTAL CALCIUM) 1250 (500 Ca) MG tablet Take 2 tablets (1,000 mg of elemental calcium total) by mouth 3 (three) times daily with meals.   clopidogrel (PLAVIX) 75 MG tablet Take 1 tablet (75 mg total) by mouth daily.   cyclobenzaprine (FLEXERIL) 5 MG tablet Take 1 tablet (5 mg total) by mouth 3 (three) times daily as needed for muscle spasms.   furosemide (LASIX) 20 MG tablet TAKE 2 TABLETS(40 MG) BY MOUTH DAILY AS NEEDED FOR FLUID RETENTION OR SWELLING (Patient taking differently: Take 40 mg by mouth daily.)   gabapentin (NEURONTIN) 100 MG capsule TAKE 2 CAPSULES THREE TIMES A DAY ALONG WITH 600 MG THREE TIMES A DAY FOR A TOTAL DAILY DOSE OF 2400 MG DAILY (Patient taking differently: Take 200 mg by mouth 3 (three) times daily. TAKE 2 CAPSULES THREE TIMES A DAY ALONG WITH 600 MG THREE TIMES A DAY FOR A TOTAL DAILY DOSE OF 2400 MG DAILY)   gabapentin (NEURONTIN) 600 MG tablet TAKE 1 TABLET BY MOUTH 3  TIMES DAILY   glipiZIDE (GLUCOTROL) 10 MG tablet TAKE ONE-HALF  TABLET BY  MOUTH TWICE DAILY BEFORE  MEALS (Patient taking differently: Take 10 mg by mouth 2 (two) times daily before a meal.)   ibuprofen (ADVIL) 600 MG tablet Take 1 tablet (600 mg total) by mouth every 6 (six) hours as needed for mild pain.   insulin degludec (TRESIBA FLEXTOUCH) 100 UNIT/ML FlexTouch Pen Inject into the skin daily.   levothyroxine (SYNTHROID) 150 MCG tablet Take 1 tablet (150 mcg total) by mouth daily at 6 (six) AM.   lisinopril (ZESTRIL) 5 MG tablet TAKE 1 TABLET(5 MG) BY MOUTH DAILY   Melatonin 10 MG TABS Take 10 mg by mouth at bedtime.   metFORMIN (GLUCOPHAGE) 500 MG  tablet Take 2 tablets (1,000 mg total) by mouth 2 (two) times daily with a meal.   nortriptyline (PAMELOR) 25 MG capsule Take 2 capsules (50 mg total) by mouth at bedtime.   omeprazole (PRILOSEC) 20 MG capsule Take 1 capsule (20 mg total) by mouth daily.   oxyCODONE-acetaminophen (PERCOCET/ROXICET) 5-325 MG tablet Take 1 tablet by mouth every 6 (six) hours as needed for severe pain.   traZODone (DESYREL) 50 MG tablet TAKE 1 TO 2 TABLETS(50 TO 100 MG) BY MOUTH AT BEDTIME AS NEEDED FOR SLEEP   triamterene-hydrochlorothiazide (MAXZIDE-25) 37.5-25 MG tablet TAKE 1 TABLET BY MOUTH  DAILY   TRULICITY 1.5 0000000 SOPN ADMINISTER 1.5 MG UNDER THE SKIN 1 TIME A WEEK   No facility-administered medications prior to visit.    Review of Systems      Objective    There were no vitals taken for this visit. BP Readings from Last 3 Encounters:  04/15/21 (!) 142/90  04/07/21 122/77  04/01/21 118/80   Wt Readings from Last 3 Encounters:  04/15/21 220 lb (99.8 kg)  04/07/21 218 lb 9.6 oz (99.2 kg)  04/01/21 215 lb (97.5 kg)       Physical Exam  ***  No results found for any visits on 07/15/21.  Assessment & Plan     ***  No follow-ups on file.      {provider attestation***:1}   Vernie Murders, Hershal Coria  [facility] [phone] (phone) [phone] (fax)  [place]

## 2021-07-17 ENCOUNTER — Other Ambulatory Visit: Payer: Self-pay

## 2021-07-17 ENCOUNTER — Encounter: Payer: Self-pay | Admitting: Family Medicine

## 2021-07-17 ENCOUNTER — Ambulatory Visit (INDEPENDENT_AMBULATORY_CARE_PROVIDER_SITE_OTHER): Payer: 59 | Admitting: Family Medicine

## 2021-07-17 VITALS — BP 97/70 | HR 91 | Temp 97.9°F | Wt 225.0 lb

## 2021-07-17 DIAGNOSIS — M5137 Other intervertebral disc degeneration, lumbosacral region: Secondary | ICD-10-CM | POA: Diagnosis not present

## 2021-07-17 DIAGNOSIS — K219 Gastro-esophageal reflux disease without esophagitis: Secondary | ICD-10-CM

## 2021-07-17 DIAGNOSIS — E1140 Type 2 diabetes mellitus with diabetic neuropathy, unspecified: Secondary | ICD-10-CM

## 2021-07-17 DIAGNOSIS — E890 Postprocedural hypothyroidism: Secondary | ICD-10-CM

## 2021-07-17 DIAGNOSIS — G894 Chronic pain syndrome: Secondary | ICD-10-CM | POA: Diagnosis not present

## 2021-07-17 DIAGNOSIS — G64 Other disorders of peripheral nervous system: Secondary | ICD-10-CM

## 2021-07-17 DIAGNOSIS — E1142 Type 2 diabetes mellitus with diabetic polyneuropathy: Secondary | ICD-10-CM

## 2021-07-17 MED ORDER — GABAPENTIN 100 MG PO CAPS: ORAL_CAPSULE | ORAL | 3 refills | Status: DC

## 2021-07-17 MED ORDER — OXYCODONE-ACETAMINOPHEN 5-325 MG PO TABS: 1.0000 | ORAL_TABLET | Freq: Four times a day (QID) | ORAL | 0 refills | Status: DC | PRN

## 2021-07-17 MED ORDER — OMEPRAZOLE 20 MG PO CPDR: 20.0000 mg | DELAYED_RELEASE_CAPSULE | Freq: Every day | ORAL | 3 refills | Status: DC

## 2021-07-17 MED ORDER — ATORVASTATIN CALCIUM 20 MG PO TABS: 20.0000 mg | ORAL_TABLET | Freq: Every day | ORAL | 3 refills | Status: DC

## 2021-07-17 NOTE — Progress Notes (Signed)
Established patient visit   Patient: Gabriella Marsh   DOB: 1966/11/19   55 y.o. Female  MRN: [id number] Visit Date: 07/17/2021  Today's healthcare provider: Lavon Paganini, MD   Chief Complaint  Patient presents with   Follow-up    Subjective    HPI   Pain  She reports chronic neuropathy pain. She has had it for 10+ years.  Aggravating factors: standing and walking Relieving factors: medication Oxycodone and Gabapentin .   Pain scale at it's worst is 10/10, taking the medication will bring it down to 4-5. She typically takes 2 pills per day with gabapentin.  She states that the pain has been worse over the last month.  She has seen the neurologist in [facility] Dr. Burman Foster diagnosed her with neuropathy. She has seen Dr. Vickki Muff and sometimes she takes 3 a day depending on her pain level.   Falls She has had falls in the past and she broke her arm last year. She reports she is unstable on her feet.   Blood glucose  She is not currently on Tresiba. She was recommended to take this medication in preparation for her thyroidectomy to lower her glucose levels. She denies getting her A1C checked since and hasn't regularly checked her levels.  ---------------------------------------------------------------------------------------------------       Medications: Outpatient Medications Prior to Visit  Medication Sig   calcitRIOL (ROCALTROL) 0.25 MCG capsule Take 1 capsule (0.25 mcg total) by mouth daily.   calcium carbonate (OS-CAL - DOSED IN MG OF ELEMENTAL CALCIUM) 1250 (500 Ca) MG tablet Take 2 tablets (1,000 mg of elemental calcium total) by mouth 3 (three) times daily with meals.   clopidogrel (PLAVIX) 75 MG tablet Take 1 tablet (75 mg total) by mouth daily.   cyclobenzaprine (FLEXERIL) 5 MG tablet Take 1 tablet (5 mg total) by mouth 3 (three) times daily as needed for muscle spasms.   furosemide (LASIX) 20 MG tablet TAKE 2 TABLETS(40 MG) BY MOUTH DAILY AS NEEDED FOR  FLUID RETENTION OR SWELLING (Patient taking differently: Take 40 mg by mouth daily.)   gabapentin (NEURONTIN) 600 MG tablet TAKE 1 TABLET BY MOUTH 3  TIMES DAILY   glipiZIDE (GLUCOTROL) 10 MG tablet TAKE ONE-HALF TABLET BY  MOUTH TWICE DAILY BEFORE  MEALS (Patient taking differently: Take 10 mg by mouth 2 (two) times daily before a meal.)   ibuprofen (ADVIL) 600 MG tablet Take 1 tablet (600 mg total) by mouth every 6 (six) hours as needed for mild pain.   insulin degludec (TRESIBA FLEXTOUCH) 100 UNIT/ML FlexTouch Pen Inject into the skin daily.   levothyroxine (SYNTHROID) 150 MCG tablet Take 1 tablet (150 mcg total) by mouth daily at 6 (six) AM.   lisinopril (ZESTRIL) 5 MG tablet TAKE 1 TABLET(5 MG) BY MOUTH DAILY   Melatonin 10 MG TABS Take 10 mg by mouth at bedtime.   metFORMIN (GLUCOPHAGE) 500 MG tablet Take 2 tablets (1,000 mg total) by mouth 2 (two) times daily with a meal.   nortriptyline (PAMELOR) 25 MG capsule Take 2 capsules (50 mg total) by mouth at bedtime.   traZODone (DESYREL) 50 MG tablet TAKE 1 TO 2 TABLETS(50 TO 100 MG) BY MOUTH AT BEDTIME AS NEEDED FOR SLEEP   triamterene-hydrochlorothiazide (MAXZIDE-25) 37.5-25 MG tablet TAKE 1 TABLET BY MOUTH  DAILY   TRULICITY 1.5 0000000 SOPN ADMINISTER 1.5 MG UNDER THE SKIN 1 TIME A WEEK   [DISCONTINUED] atorvastatin (LIPITOR) 20 MG tablet Take 1 tablet (20 mg total) by  mouth daily.   [DISCONTINUED] gabapentin (NEURONTIN) 100 MG capsule TAKE 2 CAPSULES THREE TIMES A DAY ALONG WITH 600 MG THREE TIMES A DAY FOR A TOTAL DAILY DOSE OF 2400 MG DAILY (Patient taking differently: Take 200 mg by mouth 3 (three) times daily. TAKE 2 CAPSULES THREE TIMES A DAY ALONG WITH 600 MG THREE TIMES A DAY FOR A TOTAL DAILY DOSE OF 2400 MG DAILY)   [DISCONTINUED] omeprazole (PRILOSEC) 20 MG capsule Take 1 capsule (20 mg total) by mouth daily.   [DISCONTINUED] oxyCODONE-acetaminophen (PERCOCET/ROXICET) 5-325 MG tablet Take 1 tablet by mouth every 6 (six) hours as  needed for severe pain.   No facility-administered medications prior to visit.   Review of Systems  Constitutional:  Negative for chills, fatigue and fever.  HENT:  Negative for ear pain, sinus pressure, sinus pain and sore throat.   Eyes:  Negative for pain and visual disturbance.  Respiratory:  Negative for cough, chest tightness, shortness of breath and wheezing.   Cardiovascular:  Negative for chest pain, palpitations and leg swelling.  Gastrointestinal:  Negative for abdominal pain, blood in stool, diarrhea, nausea and vomiting.  Genitourinary:  Negative for dysuria, flank pain, frequency, pelvic pain and urgency.  Musculoskeletal:  Positive for back pain, myalgias and neck pain.  Neurological:  Negative for dizziness, weakness, light-headedness, numbness and headaches.       Objective    BP 97/70 (BP Location: Right Arm, Patient Position: Sitting, Cuff Size: Normal)   Pulse 91   Temp 97.9 F (36.6 C) (Oral)   Wt 225 lb (102.1 kg)   SpO2 97%   BMI 32.28 kg/m      Physical Exam Vitals reviewed.  Constitutional:      General: She is not in acute distress.    Appearance: Normal appearance. She is well-developed. She is not diaphoretic.  HENT:     Head: Normocephalic and atraumatic.  Eyes:     General: No scleral icterus.    Conjunctiva/sclera: Conjunctivae normal.  Neck:     Thyroid: No thyromegaly.  Cardiovascular:     Rate and Rhythm: Regular rhythm. Tachycardia present.     Pulses: Normal pulses.     Heart sounds: Normal heart sounds. No murmur heard. Pulmonary:     Effort: Pulmonary effort is normal. No respiratory distress.     Breath sounds: Normal breath sounds. No wheezing, rhonchi or rales.  Musculoskeletal:     Cervical back: Neck supple.     Right lower leg: No edema.     Left lower leg: No edema.  Lymphadenopathy:     Cervical: No cervical adenopathy.  Skin:    General: Skin is warm and dry.     Findings: No rash.  Neurological:     Mental  Status: She is alert and oriented to person, place, and time. Mental status is at baseline.  Psychiatric:        Mood and Affect: Mood normal.        Behavior: Behavior normal.    No results found for any visits on 07/17/21.  Assessment & Plan     Problem List Items Addressed This Visit       Digestive   GERD (gastroesophageal reflux disease)    Chronic and stable F/b GI Refilled PPI       Relevant Medications   omeprazole (PRILOSEC) 20 MG capsule     Endocrine   Diabetic neuropathy (HCC)    Chronic and stable Continue gabapentin '800mg'$  TID - refilled today  Relevant Medications   atorvastatin (LIPITOR) 20 MG tablet   Type 2 diabetes mellitus with diabetic neuropathy, without long-term current use of insulin (HCC)    Chronic and previously with hyperglycemia Continue current meds - not on tresiba currently Will recheck A1c and re-consider meds F/u in 55m       Relevant Medications   atorvastatin (LIPITOR) 20 MG tablet   Other Relevant Orders   Hemoglobin A1c     Nervous and Auditory   Disorder of peripheral nervous system - Primary    See above       Relevant Medications   gabapentin (NEURONTIN) 100 MG capsule   Other Relevant Orders   Ambulatory referral to Pain Clinic     Musculoskeletal and Integument   DDD (degenerative disc disease), lumbosacral    Chronic and stable See below - referral to pain management       Relevant Medications   oxyCODONE-acetaminophen (PERCOCET/ROXICET) 5-325 MG tablet   Other Relevant Orders   Ambulatory referral to Pain Clinic     Other   S/P total thyroidectomy    Recheck TSH       Relevant Orders   TSH   Hypocalcemia   Relevant Orders   Comprehensive metabolic panel   Chronic pain syndrome    Longstanding chronic pain related to neuropathy On chronic opioids Will continue, but refer to pain management for ongoing management       Relevant Medications   gabapentin (NEURONTIN) 100 MG capsule    oxyCODONE-acetaminophen (PERCOCET/ROXICET) 5-325 MG tablet   Other Relevant Orders   Ambulatory referral to Pain Clinic    Return in about 3 months (around 10/17/2021) for chronic disease f/u, With new PCP.      I,Essence Turner,acting as a sEducation administratorfor ALavon Paganini MD.,have documented all relevant documentation on the behalf of ALavon Paganini MD,as directed by  ALavon Paganini MD while in the presence of ALavon Paganini MD.   I, ALavon Paganini MD, have reviewed all documentation for this visit. The documentation on 07/18/21 for the exam, diagnosis, procedures, and orders are all accurate and complete.   Lizzet Hendley, ADionne Bucy MD, MPH [facility]

## 2021-07-17 NOTE — Assessment & Plan Note (Signed)
Chronic and stable F/b GI Refilled PPI

## 2021-07-17 NOTE — Assessment & Plan Note (Signed)
Chronic and previously with hyperglycemia Continue current meds - not on tresiba currently Will recheck A1c and re-consider meds F/u in 63m

## 2021-07-17 NOTE — Assessment & Plan Note (Addendum)
Longstanding chronic pain related to neuropathy On chronic opioids Will continue, but refer to pain management for ongoing management

## 2021-07-17 NOTE — Assessment & Plan Note (Signed)
Chronic and stable Continue gabapentin '800mg'$  TID - refilled today

## 2021-07-17 NOTE — Assessment & Plan Note (Signed)
Chronic and stable See below - referral to pain management

## 2021-07-17 NOTE — Assessment & Plan Note (Signed)
See above

## 2021-07-18 NOTE — Assessment & Plan Note (Signed)
Recheck TSH 

## 2021-07-22 ENCOUNTER — Other Ambulatory Visit
Admission: RE | Admit: 2021-07-22 | Discharge: 2021-07-22 | Disposition: A | Discharge status: Home / Self Care | Payer: 59 | Source: Ambulatory Visit | Attending: Podiatry | Admitting: Podiatry

## 2021-07-22 ENCOUNTER — Other Ambulatory Visit: Payer: Self-pay

## 2021-07-22 ENCOUNTER — Inpatient Hospital Stay
Admission: EM | Admit: 2021-07-22 | Discharge: 2021-07-26 | DRG: 853 | Disposition: A | Discharge status: Home / Self Care | Payer: 59 | Attending: Internal Medicine | Admitting: Internal Medicine

## 2021-07-22 ENCOUNTER — Inpatient Hospital Stay: Payer: 59

## 2021-07-22 DIAGNOSIS — G894 Chronic pain syndrome: Secondary | ICD-10-CM | POA: Diagnosis not present

## 2021-07-22 DIAGNOSIS — K219 Gastro-esophageal reflux disease without esophagitis: Secondary | ICD-10-CM | POA: Diagnosis present

## 2021-07-22 DIAGNOSIS — E1142 Type 2 diabetes mellitus with diabetic polyneuropathy: Secondary | ICD-10-CM | POA: Diagnosis present

## 2021-07-22 DIAGNOSIS — E7800 Pure hypercholesterolemia, unspecified: Secondary | ICD-10-CM | POA: Diagnosis present

## 2021-07-22 DIAGNOSIS — L02611 Cutaneous abscess of right foot: Secondary | ICD-10-CM | POA: Insufficient documentation

## 2021-07-22 DIAGNOSIS — Z8262 Family history of osteoporosis: Secondary | ICD-10-CM | POA: Diagnosis not present

## 2021-07-22 DIAGNOSIS — E1140 Type 2 diabetes mellitus with diabetic neuropathy, unspecified: Secondary | ICD-10-CM | POA: Diagnosis not present

## 2021-07-22 DIAGNOSIS — Z8249 Family history of ischemic heart disease and other diseases of the circulatory system: Secondary | ICD-10-CM

## 2021-07-22 DIAGNOSIS — L97519 Non-pressure chronic ulcer of other part of right foot with unspecified severity: Secondary | ICD-10-CM | POA: Diagnosis present

## 2021-07-22 DIAGNOSIS — Z87891 Personal history of nicotine dependence: Secondary | ICD-10-CM | POA: Diagnosis not present

## 2021-07-22 DIAGNOSIS — F419 Anxiety disorder, unspecified: Secondary | ICD-10-CM | POA: Diagnosis present

## 2021-07-22 DIAGNOSIS — A4101 Sepsis due to Methicillin susceptible Staphylococcus aureus: Principal | ICD-10-CM | POA: Diagnosis present

## 2021-07-22 DIAGNOSIS — Z794 Long term (current) use of insulin: Secondary | ICD-10-CM

## 2021-07-22 DIAGNOSIS — E11628 Type 2 diabetes mellitus with other skin complications: Secondary | ICD-10-CM | POA: Diagnosis present

## 2021-07-22 DIAGNOSIS — U071 COVID-19: Secondary | ICD-10-CM | POA: Diagnosis present

## 2021-07-22 DIAGNOSIS — D689 Coagulation defect, unspecified: Secondary | ICD-10-CM | POA: Diagnosis present

## 2021-07-22 DIAGNOSIS — L03115 Cellulitis of right lower limb: Secondary | ICD-10-CM | POA: Diagnosis present

## 2021-07-22 DIAGNOSIS — F418 Other specified anxiety disorders: Secondary | ICD-10-CM | POA: Diagnosis present

## 2021-07-22 DIAGNOSIS — M170 Bilateral primary osteoarthritis of knee: Secondary | ICD-10-CM | POA: Diagnosis present

## 2021-07-22 DIAGNOSIS — E890 Postprocedural hypothyroidism: Secondary | ICD-10-CM | POA: Diagnosis present

## 2021-07-22 DIAGNOSIS — Z6841 Body Mass Index (BMI) 40.0 and over, adult: Secondary | ICD-10-CM

## 2021-07-22 DIAGNOSIS — Z83438 Family history of other disorder of lipoprotein metabolism and other lipidemia: Secondary | ICD-10-CM

## 2021-07-22 DIAGNOSIS — A419 Sepsis, unspecified organism: Secondary | ICD-10-CM | POA: Diagnosis present

## 2021-07-22 DIAGNOSIS — Z833 Family history of diabetes mellitus: Secondary | ICD-10-CM

## 2021-07-22 DIAGNOSIS — Z79890 Hormone replacement therapy (postmenopausal): Secondary | ICD-10-CM

## 2021-07-22 DIAGNOSIS — L089 Local infection of the skin and subcutaneous tissue, unspecified: Secondary | ICD-10-CM | POA: Diagnosis not present

## 2021-07-22 DIAGNOSIS — Z7902 Long term (current) use of antithrombotics/antiplatelets: Secondary | ICD-10-CM

## 2021-07-22 DIAGNOSIS — I10 Essential (primary) hypertension: Secondary | ICD-10-CM | POA: Diagnosis present

## 2021-07-22 DIAGNOSIS — E11621 Type 2 diabetes mellitus with foot ulcer: Secondary | ICD-10-CM | POA: Diagnosis present

## 2021-07-22 DIAGNOSIS — Z888 Allergy status to other drugs, medicaments and biological substances status: Secondary | ICD-10-CM | POA: Diagnosis not present

## 2021-07-22 DIAGNOSIS — M868X7 Other osteomyelitis, ankle and foot: Secondary | ICD-10-CM | POA: Diagnosis present

## 2021-07-22 DIAGNOSIS — F32A Depression, unspecified: Secondary | ICD-10-CM | POA: Diagnosis present

## 2021-07-22 DIAGNOSIS — Z79899 Other long term (current) drug therapy: Secondary | ICD-10-CM

## 2021-07-22 LAB — CBC
HCT: 36.3 % (ref 36.0–46.0)
Hemoglobin: 12.6 g/dL (ref 12.0–15.0)
MCH: 30.4 pg (ref 26.0–34.0)
MCHC: 34.7 g/dL (ref 30.0–36.0)
MCV: 87.7 fL (ref 80.0–100.0)
Platelets: 436 10*3/uL — ABNORMAL HIGH (ref 150–400)
RBC: 4.14 MIL/uL (ref 3.87–5.11)
RDW: 13.1 % (ref 11.5–15.5)
WBC: 15.2 10*3/uL — ABNORMAL HIGH (ref 4.0–10.5)
nRBC: 0 % (ref 0.0–0.2)

## 2021-07-22 LAB — PROCALCITONIN: Procalcitonin: 0.31 ng/mL

## 2021-07-22 LAB — SEDIMENTATION RATE: Sed Rate: 88 mm/hr — ABNORMAL HIGH (ref 0–30)

## 2021-07-22 LAB — BASIC METABOLIC PANEL
Anion gap: 11 (ref 5–15)
BUN: 9 mg/dL (ref 6–20)
CO2: 31 mmol/L (ref 22–32)
Calcium: 7.7 mg/dL — ABNORMAL LOW (ref 8.9–10.3)
Chloride: 88 mmol/L — ABNORMAL LOW (ref 98–111)
Creatinine, Ser: 0.65 mg/dL (ref 0.44–1.00)
GFR, Estimated: >60 mL/min (ref >60)
Glucose, Bld: 279 mg/dL — ABNORMAL HIGH (ref 70–99)
Potassium: 3.5 mmol/L (ref 3.5–5.1)
Sodium: 130 mmol/L — ABNORMAL LOW (ref 135–145)

## 2021-07-22 LAB — GLUCOSE, CAPILLARY
Glucose-Capillary: 215 mg/dL — ABNORMAL HIGH (ref 70–99)
Glucose-Capillary: 214 mg/dL — ABNORMAL HIGH (ref 70–99)

## 2021-07-22 LAB — LACTIC ACID, PLASMA
Lactic Acid, Venous: 2.9 mmol/L (ref 0.5–1.9)
Lactic Acid, Venous: 2.4 mmol/L (ref 0.5–1.9)

## 2021-07-22 LAB — PROTIME-INR
INR: 1 (ref 0.8–1.2)
Prothrombin Time: 13.4 seconds (ref 11.4–15.2)

## 2021-07-22 LAB — C-REACTIVE PROTEIN: CRP: 14.9 mg/dL — ABNORMAL HIGH (ref <1.0)

## 2021-07-22 LAB — RESP PANEL BY RT-PCR (FLU A&B, COVID) ARPGX2
Influenza A by PCR: NEGATIVE
Influenza B by PCR: NEGATIVE
SARS Coronavirus 2 by RT PCR: POSITIVE — AB

## 2021-07-22 LAB — BRAIN NATRIURETIC PEPTIDE: B Natriuretic Peptide: 46.8 pg/mL (ref 0.0–100.0)

## 2021-07-22 LAB — APTT: aPTT: 30 seconds (ref 24–36)

## 2021-07-22 MED ORDER — ONDANSETRON HCL 4 MG/2ML IJ SOLN: 4.0000 mg | Freq: Three times a day (TID) | INTRAMUSCULAR | Status: DC | PRN

## 2021-07-22 MED ORDER — SODIUM CHLORIDE 0.9 % IV SOLN: INTRAVENOUS | Status: DC

## 2021-07-22 MED ORDER — NORTRIPTYLINE HCL 25 MG PO CAPS
50.0000 mg | ORAL_CAPSULE | Freq: Every day | ORAL | Status: DC
  Administered 2021-07-22 – 2021-07-25 (×4): 50 mg via ORAL
  Filled 2021-07-22 (×5): qty 2

## 2021-07-22 MED ORDER — OXYCODONE-ACETAMINOPHEN 5-325 MG PO TABS
1.0000 | ORAL_TABLET | ORAL | Status: DC | PRN
Start: 2021-07-22 — End: 2021-07-26
  Administered 2021-07-22 – 2021-07-26 (×7): 1 via ORAL
  Filled 2021-07-22 (×8): qty 1

## 2021-07-22 MED ORDER — LISINOPRIL 5 MG PO TABS
5.0000 mg | ORAL_TABLET | Freq: Every day | ORAL | Status: DC
  Administered 2021-07-22: 5 mg via ORAL
  Filled 2021-07-22: qty 1

## 2021-07-22 MED ORDER — GABAPENTIN 100 MG PO CAPS: 200.0000 mg | ORAL_CAPSULE | Freq: Three times a day (TID) | ORAL | Status: DC

## 2021-07-22 MED ORDER — ALBUTEROL SULFATE HFA 108 (90 BASE) MCG/ACT IN AERS
2.0000 | INHALATION_SPRAY | RESPIRATORY_TRACT | Status: DC | PRN
  Filled 2021-07-22: qty 6.7

## 2021-07-22 MED ORDER — TRAZODONE HCL 50 MG PO TABS
50.0000 mg | ORAL_TABLET | Freq: Every evening | ORAL | Status: DC | PRN
  Administered 2021-07-22: 50 mg via ORAL
  Filled 2021-07-22: qty 1

## 2021-07-22 MED ORDER — GABAPENTIN 600 MG PO TABS: 600.0000 mg | ORAL_TABLET | Freq: Three times a day (TID) | ORAL | Status: DC

## 2021-07-22 MED ORDER — PIPERACILLIN-TAZOBACTAM 4.5 G IVPB
4.5000 g | Freq: Once | INTRAVENOUS | Status: DC
  Filled 2021-07-22: qty 100

## 2021-07-22 MED ORDER — MELATONIN 5 MG PO TABS
10.0000 mg | ORAL_TABLET | Freq: Every day | ORAL | Status: DC
  Administered 2021-07-22 – 2021-07-25 (×4): 10 mg via ORAL
  Filled 2021-07-22 (×4): qty 2

## 2021-07-22 MED ORDER — VANCOMYCIN HCL 10 G IV SOLR
2500.0000 mg | Freq: Once | INTRAVENOUS | Status: DC
  Filled 2021-07-22: qty 2500

## 2021-07-22 MED ORDER — PANTOPRAZOLE SODIUM 40 MG PO TBEC
40.0000 mg | DELAYED_RELEASE_TABLET | Freq: Every day | ORAL | Status: DC
  Administered 2021-07-23 – 2021-07-26 (×4): 40 mg via ORAL
  Filled 2021-07-22 (×4): qty 1

## 2021-07-22 MED ORDER — VANCOMYCIN HCL IN DEXTROSE 1-5 GM/200ML-% IV SOLN: 1000.0000 mg | Freq: Once | INTRAVENOUS | Status: DC

## 2021-07-22 MED ORDER — CALCIUM CARBONATE 1250 (500 CA) MG PO TABS
2.0000 | ORAL_TABLET | Freq: Three times a day (TID) | ORAL | Status: DC
  Administered 2021-07-22 – 2021-07-26 (×9): 1000 mg via ORAL
  Filled 2021-07-22 (×13): qty 2

## 2021-07-22 MED ORDER — VANCOMYCIN HCL 1500 MG/300ML IV SOLN
1500.0000 mg | Freq: Once | INTRAVENOUS | Status: AC
  Administered 2021-07-23: 1500 mg via INTRAVENOUS
  Filled 2021-07-22: qty 300

## 2021-07-22 MED ORDER — VANCOMYCIN HCL 1500 MG/300ML IV SOLN
1500.0000 mg | INTRAVENOUS | Status: DC
  Administered 2021-07-24: 1500 mg via INTRAVENOUS
  Filled 2021-07-22 (×3): qty 300

## 2021-07-22 MED ORDER — VANCOMYCIN HCL 2000 MG/400ML IV SOLN: 2000.0000 mg | Freq: Once | INTRAVENOUS | Status: DC

## 2021-07-22 MED ORDER — SODIUM CHLORIDE 0.9 % IV SOLN
2.0000 g | INTRAVENOUS | Status: DC
  Administered 2021-07-22 – 2021-07-24 (×3): 2 g via INTRAVENOUS
  Filled 2021-07-22 (×4): qty 20

## 2021-07-22 MED ORDER — LEVOTHYROXINE SODIUM 50 MCG PO TABS
150.0000 ug | ORAL_TABLET | Freq: Every day | ORAL | Status: DC
  Administered 2021-07-23 – 2021-07-26 (×4): 150 ug via ORAL
  Filled 2021-07-22 (×4): qty 3

## 2021-07-22 MED ORDER — SODIUM CHLORIDE 0.9 % IV BOLUS
1000.0000 mL | Freq: Once | INTRAVENOUS | Status: AC
  Administered 2021-07-22: 1000 mL via INTRAVENOUS

## 2021-07-22 MED ORDER — HEPARIN SODIUM (PORCINE) 5000 UNIT/ML IJ SOLN
5000.0000 [IU] | Freq: Three times a day (TID) | INTRAMUSCULAR | Status: DC
  Administered 2021-07-22 – 2021-07-26 (×8): 5000 [IU] via SUBCUTANEOUS
  Filled 2021-07-22 (×8): qty 1

## 2021-07-22 MED ORDER — DM-GUAIFENESIN ER 30-600 MG PO TB12
1.0000 | ORAL_TABLET | Freq: Two times a day (BID) | ORAL | Status: DC
  Administered 2021-07-22 – 2021-07-26 (×9): 1 via ORAL
  Filled 2021-07-22 (×10): qty 1

## 2021-07-22 MED ORDER — CYCLOBENZAPRINE HCL 10 MG PO TABS: 5.0000 mg | ORAL_TABLET | Freq: Three times a day (TID) | ORAL | Status: DC | PRN

## 2021-07-22 MED ORDER — GADOBUTROL 1 MMOL/ML IV SOLN
10.0000 mL | Freq: Once | INTRAVENOUS | Status: AC | PRN
  Administered 2021-07-22: 10 mL via INTRAVENOUS

## 2021-07-22 MED ORDER — ACETAMINOPHEN 160 MG/5ML PO SOLN
650.0000 mg | Freq: Four times a day (QID) | ORAL | Status: DC | PRN
  Filled 2021-07-22: qty 20.3

## 2021-07-22 MED ORDER — INSULIN ASPART 100 UNIT/ML IJ SOLN
0.0000 [IU] | Freq: Every day | INTRAMUSCULAR | Status: DC
  Administered 2021-07-22: 2 [IU] via SUBCUTANEOUS
  Administered 2021-07-25: 5 [IU] via SUBCUTANEOUS
  Filled 2021-07-22 (×3): qty 1

## 2021-07-22 MED ORDER — GABAPENTIN 400 MG PO CAPS
800.0000 mg | ORAL_CAPSULE | Freq: Three times a day (TID) | ORAL | Status: DC
  Administered 2021-07-22 – 2021-07-26 (×10): 800 mg via ORAL
  Filled 2021-07-22 (×10): qty 2

## 2021-07-22 MED ORDER — HYDRALAZINE HCL 20 MG/ML IJ SOLN: 5.0000 mg | INTRAMUSCULAR | Status: DC | PRN

## 2021-07-22 MED ORDER — ATORVASTATIN CALCIUM 20 MG PO TABS
20.0000 mg | ORAL_TABLET | Freq: Every day | ORAL | Status: DC
  Administered 2021-07-22 – 2021-07-25 (×4): 20 mg via ORAL
  Filled 2021-07-22 (×4): qty 1

## 2021-07-22 MED ORDER — INSULIN ASPART 100 UNIT/ML IJ SOLN
0.0000 [IU] | Freq: Three times a day (TID) | INTRAMUSCULAR | Status: DC
  Administered 2021-07-22 – 2021-07-24 (×6): 3 [IU] via SUBCUTANEOUS
  Administered 2021-07-24: 2 [IU] via SUBCUTANEOUS
  Administered 2021-07-25 – 2021-07-26 (×4): 3 [IU] via SUBCUTANEOUS
  Filled 2021-07-22 (×11): qty 1

## 2021-07-22 NOTE — ED Notes (Signed)
Informed RN bed assigned 

## 2021-07-22 NOTE — ED Provider Notes (Signed)
[facility]  ____________________________________________   Event Date/Time   First MD Initiated Contact with Patient 07/22/21 1229     (approximate)  I have reviewed the triage vital signs and the nursing notes.   HISTORY  Chief Complaint Wound Infection    HPI Sian Bourguignon is a 55 y.o. female diabetes, hypertension, hyperlipidemia, GERD who presents with a toe infection.  Patient notes that she has had a wound on the left toe for several weeks and was being seen by podiatry fairly regularly for debridement.  She missed her appointment last week and has been feeling generally unwell with chills and worsening swelling drainage and pain of the left great toe.  She was seen in podiatry office today who expressed significant pus from the wound and probed to bone and was told to come to the ER for admission for MRI and antibiotics.  She denies nausea, vomiting, lightheadedness, dizziness.         Past Medical History:  Diagnosis Date   Arthritis    knees   Blood clotting disorder ([place])    on toe    Degenerative disc disease, lumbar    Diabetes mellitus without complication (HCC)    GERD (gastroesophageal reflux disease)    Headache    daily - AM (has not been able to have SPG blocks lately)   Hyperlipidemia    Hypertension    Neuropathy    feet   Vertigo    3-4x/yr    Patient Active Problem List   Diagnosis Date Noted   Diabetic foot infection ([place]) 07/22/2021   Depression with anxiety 07/22/2021   Sepsis ([place]) 07/22/2021   COVID-19 virus infection 07/22/2021   Hypocalcemia 07/17/2021   Chronic pain syndrome 07/17/2021   S/P total thyroidectomy 03/28/2021   Thyroid nodule    Blue toe syndrome of left lower extremity ([place]) 10/01/2020   Schamberg's purpura 10/09/2019   Leg swelling 10/09/2019   Benign positional vertigo 02/03/2016   Common migraine with intractable migraine 06/18/2015   Anxiety 05/04/2015   Adaptation reaction  05/03/2015   DDD (degenerative disc disease), lumbosacral 05/03/2015   Type 2 diabetes mellitus with diabetic neuropathy, without long-term current use of insulin ([place]) 05/03/2015   Hypercholesteremia 05/03/2015   Insomnia due to medical condition 05/03/2015   Adiposity 05/03/2015   Disorder of peripheral nervous system 05/03/2015   Snores 05/03/2015   Diabetic neuropathy ([place]) 03/06/2015   Essential hypertension 03/06/2015   Elevated liver function tests 03/06/2015   Obesity 03/06/2015   GERD (gastroesophageal reflux disease) 03/06/2015   Multinodular goiter 03/06/2015   RLS (restless legs syndrome) 03/06/2015   Leg cramps 03/06/2015    Past Surgical History:  Procedure Laterality Date   ABDOMINAL HYSTERECTOMY     But still has cervix   CESAREAN SECTION     CHOLECYSTECTOMY     left arm frature  10/12/2020   OVARIAN CYST SURGERY     PARATHYROIDECTOMY  03/28/2021   Procedure: PARATHYROIDECTOMY AUTOTRANSPLANT;  Surgeon: Fredirick Maudlin, MD;  Location: [facility] ORS;  Service: General;;   SHOULDER SURGERY Left 12/26/014   Dr. Little Ishikawa, [facility]   THYROIDECTOMY N/A 03/28/2021   Procedure: THYROIDECTOMY, total;  Surgeon: Fredirick Maudlin, MD;  Location: [facility] ORS;  Service: General;  Laterality: N/A;  Provider requesting 3 hours /180 minutes for procedure   TONSILLECTOMY AND ADENOIDECTOMY     UTERINE FIBROID SURGERY      Prior to Admission medications   Medication Sig Start Date End Date  Taking? Authorizing Provider  atorvastatin (LIPITOR) 20 MG tablet Take 1 tablet (20 mg total) by mouth daily. 07/17/21  Yes Bacigalupo, Dionne Bucy, MD  cyclobenzaprine (FLEXERIL) 5 MG tablet Take 1 tablet (5 mg total) by mouth 3 (three) times daily as needed for muscle spasms. 05/22/20  Yes Mar Daring, PA-C  furosemide (LASIX) 20 MG tablet TAKE 2 TABLETS(40 MG) BY MOUTH DAILY AS NEEDED FOR FLUID RETENTION OR SWELLING Patient taking differently: Take 40 mg by mouth daily. 04/01/20  Yes Burnette, Clearnce Sorrel, PA-C  gabapentin (NEURONTIN) 100 MG capsule TAKE 2 CAPSULES THREE TIMES A DAY ALONG WITH 600 MG THREE TIMES A DAY FOR A TOTAL DAILY DOSE OF 2400 MG DAILY 07/17/21  Yes Bacigalupo, Dionne Bucy, MD  gabapentin (NEURONTIN) 600 MG tablet TAKE 1 TABLET BY MOUTH 3  TIMES DAILY 07/05/21  Yes Bacigalupo, Dionne Bucy, MD  glipiZIDE (GLUCOTROL) 10 MG tablet TAKE ONE-HALF TABLET BY  MOUTH TWICE DAILY BEFORE  MEALS Patient taking differently: Take 10 mg by mouth 2 (two) times daily before a meal. 11/25/20  Yes Burnette, Clearnce Sorrel, PA-C  ibuprofen (ADVIL) 600 MG tablet Take 1 tablet (600 mg total) by mouth every 6 (six) hours as needed for mild pain. 04/02/21  Yes Fredirick Maudlin, MD  levothyroxine (SYNTHROID) 150 MCG tablet Take 1 tablet (150 mcg total) by mouth daily at 6 (six) AM. 03/30/21  Yes Fredirick Maudlin, MD  lisinopril (ZESTRIL) 5 MG tablet TAKE 1 TABLET(5 MG) BY MOUTH DAILY 06/12/21  Yes Birdie Sons, MD  Melatonin 10 MG TABS Take 10 mg by mouth at bedtime.   Yes [provider]  metFORMIN (GLUCOPHAGE) 500 MG tablet Take 2 tablets (1,000 mg total) by mouth 2 (two) times daily with a meal. 06/17/21  Yes Bacigalupo, Dionne Bucy, MD  nortriptyline (PAMELOR) 25 MG capsule Take 2 capsules (50 mg total) by mouth at bedtime. 04/29/21  Yes Bacigalupo, Dionne Bucy, MD  omeprazole (PRILOSEC) 20 MG capsule Take 1 capsule (20 mg total) by mouth daily. 07/17/21  Yes Bacigalupo, Dionne Bucy, MD  oxyCODONE-acetaminophen (PERCOCET/ROXICET) 5-325 MG tablet Take 1 tablet by mouth every 6 (six) hours as needed for severe pain. 07/17/21  Yes Virginia Crews, MD  traZODone (DESYREL) 50 MG tablet TAKE 1 TO 2 TABLETS(50 TO 100 MG) BY MOUTH AT BEDTIME AS NEEDED FOR SLEEP 04/30/21  Yes Bacigalupo, Dionne Bucy, MD  triamterene-hydrochlorothiazide (MAXZIDE-25) 37.5-25 MG tablet TAKE 1 TABLET BY MOUTH  DAILY 04/14/21  Yes Bacigalupo, Dionne Bucy, MD  TRULICITY 1.5 0000000 SOPN ADMINISTER 1.5 MG UNDER THE SKIN 1 TIME A WEEK Patient taking  differently: Inject 1.5 mg into the skin every Wednesday. 05/09/21  Yes Bacigalupo, Dionne Bucy, MD  calcitRIOL (ROCALTROL) 0.25 MCG capsule Take 1 capsule (0.25 mcg total) by mouth daily. Patient not taking: Reported on 07/22/2021 04/02/21   Olean Ree, MD  calcium carbonate (OS-CAL - DOSED IN MG OF ELEMENTAL CALCIUM) 1250 (500 Ca) MG tablet Take 2 tablets (1,000 mg of elemental calcium total) by mouth 3 (three) times daily with meals. Patient not taking: Reported on 07/22/2021 04/01/21   Olean Ree, MD  clopidogrel (PLAVIX) 75 MG tablet Take 1 tablet (75 mg total) by mouth daily. Patient not taking: Reported on 07/22/2021 09/05/20   Kris Hartmann, NP  insulin degludec (TRESIBA FLEXTOUCH) 100 UNIT/ML FlexTouch Pen Inject into the skin daily. Patient not taking: Reported on 07/22/2021    [provider]    Allergies Celecoxib, Pregabalin, Buspar [buspirone], and  Citalopram  Family History  Problem Relation Age of Onset   Diabetes Father    Heart disease Father    Hypertension Father    Hyperlipidemia Father    Congestive Heart Failure Father    Healthy Brother    Osteoporosis Mother    Irritable bowel syndrome Mother    Osteoporosis Maternal Grandmother     Social History Social History   Tobacco Use   Smoking status: Former    Types: Cigarettes    Quit date: 2013    Years since quitting: 9.5   Smokeless tobacco: Never  Vaping Use   Vaping Use: Never used  Substance Use Topics   Alcohol use: No    Alcohol/week: 0.0 standard drinks   Drug use: Never    Review of Systems   Review of Systems  Constitutional:  Positive for chills and fatigue. Negative for fever.  Gastrointestinal:  Negative for abdominal pain, nausea and vomiting.  Musculoskeletal:  Positive for joint swelling.  Skin:  Positive for rash and wound.  Neurological:  Negative for light-headedness.  All other systems reviewed and are negative.  Physical Exam Updated Vital Signs BP 126/74 (BP Location:  Left Arm)   Pulse 96   Temp 99 F (37.2 C) (Oral)   Resp 16   Ht '5\' 2"'$  (1.575 m)   Wt 102.1 kg   SpO2 100%   BMI 41.15 kg/m   Physical Exam Vitals and nursing note reviewed.  Constitutional:      General: She is not in acute distress.    Appearance: Normal appearance.  HENT:     Head: Normocephalic and atraumatic.  Eyes:     General: No scleral icterus.    Conjunctiva/sclera: Conjunctivae normal.  Pulmonary:     Effort: Pulmonary effort is normal. No respiratory distress.     Breath sounds: No stridor.  Musculoskeletal:        General: No deformity or signs of injury.     Cervical back: Normal range of motion.  Skin:    General: Skin is dry.     Coloration: Skin is not jaundiced or pale.     Comments: Left great toe with significant erythema and skin breakdown ulceration on the plantar aspect with surrounding erythema  Neurological:     General: No focal deficit present.     Mental Status: She is alert and oriented to person, place, and time. Mental status is at baseline.  Psychiatric:        Mood and Affect: Mood normal.        Behavior: Behavior normal.     LABS (all labs ordered are listed, but only abnormal results are displayed)  Labs Reviewed  RESP PANEL BY RT-PCR (FLU A&B, COVID) ARPGX2 - Abnormal; Notable for the following components:      Result Value   SARS Coronavirus 2 by RT PCR POSITIVE (*)    All other components within normal limits  CBC - Abnormal; Notable for the following components:   WBC 15.2 (*)    Platelets 436 (*)    All other components within normal limits  BASIC METABOLIC PANEL - Abnormal; Notable for the following components:   Sodium 130 (*)    Chloride 88 (*)    Glucose, Bld 279 (*)    Calcium 7.7 (*)    All other components within normal limits  LACTIC ACID, PLASMA - Abnormal; Notable for the following components:   Lactic Acid, Venous 2.9 (*)    All other components  within normal limits  LACTIC ACID, PLASMA - Abnormal;  Notable for the following components:   Lactic Acid, Venous 2.4 (*)    All other components within normal limits  SEDIMENTATION RATE - Abnormal; Notable for the following components:   Sed Rate 88 (*)    All other components within normal limits  CULTURE, BLOOD (ROUTINE X 2)  CULTURE, BLOOD (ROUTINE X 2)  BRAIN NATRIURETIC PEPTIDE  PROTIME-INR  APTT  PROCALCITONIN  C-REACTIVE PROTEIN   ____________________________________________  EKG  N/a ____________________________________________  RADIOLOGY Almeta Monas, personally viewed and evaluated these images (plain radiographs) as part of my medical decision making, as well as reviewing the written report by the radiologist.  ED MD interpretation:  n/a    ____________________________________________   PROCEDURES  Procedure(s) performed (including Critical Care):  Procedures   ____________________________________________   INITIAL IMPRESSION / ASSESSMENT AND PLAN / ED COURSE     55 year old female presents with a diabetic foot wound.  Seen by podiatry earlier today for left great toe wound he was concerned about osteomyelitis recommended MRI and IV antibiotics.  She is afebrile well-appearing.  Does have a leukocytosis and lactate of 2.5.  Received fluids and IV antibiotics.  Admitted to medicine.      ____________________________________________   FINAL CLINICAL IMPRESSION(S) / ED DIAGNOSES  Final diagnoses:  Foot infection     ED Discharge Orders     None        Note:  This document was prepared using Dragon voice recognition software and may include unintentional dictation errors.    Rada Hay, MD 07/22/21 [id number]

## 2021-07-22 NOTE — Consult Note (Signed)
[place] Nurse Consult Note: Reason for Consult: Right great toe ulceration. Consulted simultaneously with Podiatric Medicine and Dr. Cleda Mccreedy has already seen.  He has discussed possible amputation of the great toe with the patient. I will provide guidance for Nursing for the topical care of this lesion until that is possible. MRI is in progress.  Patient is positive for Covid, so case will need to be at end of day. Wound type: Infectious, neuropathic Pressure Injury POA: N/A Dressing procedure/placement/frequency: Nursing to cleanse lesion with NS, pat dry. Dress with antimicrobial, nonadherent (xeroform, [place] # [phone]), top with dry dressing and secure. Change BID.  [place] nursing team will not follow, but will remain available to this patient, the nursing and medical teams.  Please re-consult if needed. Thanks, Maudie Flakes, MSN, RN, [place], Arther Abbott  Pager# [phone]

## 2021-07-22 NOTE — Progress Notes (Signed)
Pharmacy Antibiotic Note  Gabriella Marsh is a 55 y.o. female admitted on 07/22/2021 with diabetic foot (right great toe).  Pharmacy has been consulted for Vancomycin dosing.  Plan: Vancomycin '2500mg'$  loading dose,  follow by Vancomycin '1500mg'$  every 24 hours.  Goal AUC 400-550. Expected AUC: 513.9 SCr used: 0.8  Height: '5\' 2"'$  (157.5 cm) Weight: 102.1 kg (225 lb) IBW/kg (Calculated) : 50.1  Temp (24hrs), Avg:98.1 F (36.7 C), Min:98.1 F (36.7 C), Max:98.1 F (36.7 C)  Recent Labs  Lab 07/22/21 1155 07/22/21 1257  WBC 15.2*  --   CREATININE 0.65  --   LATICACIDVEN  --  2.9*    Estimated Creatinine Clearance: 90 mL/min (by C-G formula based on SCr of 0.65 mg/dL).    Allergies  Allergen Reactions   Celecoxib Shortness Of Breath   Pregabalin Shortness Of Breath   Buspar [Buspirone] Other (See Comments)    Tachycardia   Citalopram Cough    Antimicrobials this admission: 07/22/21 Vancomycin>>  07/22/21 ceftriaxone>>   Microbiology results:  07/22/21 BCx: pending  07/22/21 Wound r foot: pending  Thank you for allowing pharmacy to be a part of this patient's care.  Nelva Hauk Rodriguez-Guzman PharmD, BCPS 07/22/2021 2:52 PM

## 2021-07-22 NOTE — ED Triage Notes (Signed)
Pt to ED from [facility] for infection to right great toe. Referred for IV antibiotics and MRI to make sure no infection in bone.  Toe wrapped today Reports infection for over a month

## 2021-07-22 NOTE — Plan of Care (Signed)
Pt admitted for infection right great toe. Alert and oriented. Covid positive. All needs met at this time. Dressing to right great toe scant serosanguineous drainage.  Problem: Education: Goal: Knowledge of General Education information will improve Description: Including pain rating scale, medication(s)/side effects and non-pharmacologic comfort measures Outcome: Progressing   Problem: Health Behavior/Discharge Planning: Goal: Ability to manage health-related needs will improve Outcome: Progressing   Problem: Clinical Measurements: Goal: Ability to maintain clinical measurements within normal limits will improve Outcome: Progressing Goal: Will remain free from infection Outcome: Progressing Goal: Diagnostic test results will improve Outcome: Progressing Goal: Respiratory complications will improve Outcome: Progressing Goal: Cardiovascular complication will be avoided Outcome: Progressing   Problem: Activity: Goal: Risk for activity intolerance will decrease Outcome: Progressing   Problem: Nutrition: Goal: Adequate nutrition will be maintained Outcome: Progressing   Problem: Coping: Goal: Level of anxiety will decrease Outcome: Progressing   Problem: Elimination: Goal: Will not experience complications related to bowel motility Outcome: Progressing Goal: Will not experience complications related to urinary retention Outcome: Progressing   Problem: Pain Managment: Goal: General experience of comfort will improve Outcome: Progressing   Problem: Safety: Goal: Ability to remain free from injury will improve Outcome: Progressing   Problem: Skin Integrity: Goal: Risk for impaired skin integrity will decrease Outcome: Progressing   Problem: Education: Goal: Knowledge of General Education information will improve Description: Including pain rating scale, medication(s)/side effects and non-pharmacologic comfort measures Outcome: Progressing   Problem: Health  Behavior/Discharge Planning: Goal: Ability to manage health-related needs will improve Outcome: Progressing   Problem: Clinical Measurements: Goal: Ability to maintain clinical measurements within normal limits will improve Outcome: Progressing Goal: Will remain free from infection Outcome: Progressing Goal: Respiratory complications will improve Outcome: Progressing Goal: Cardiovascular complication will be avoided Outcome: Progressing   Problem: Activity: Goal: Risk for activity intolerance will decrease Outcome: Progressing   Problem: Nutrition: Goal: Adequate nutrition will be maintained Outcome: Progressing   Problem: Coping: Goal: Level of anxiety will decrease Outcome: Progressing   Problem: Elimination: Goal: Will not experience complications related to bowel motility Outcome: Progressing Goal: Will not experience complications related to urinary retention Outcome: Progressing   Problem: Pain Managment: Goal: General experience of comfort will improve Outcome: Progressing   Problem: Safety: Goal: Ability to remain free from injury will improve Outcome: Progressing   Problem: Skin Integrity: Goal: Risk for impaired skin integrity will decrease Outcome: Progressing   Problem: Education: Goal: Ability to describe self-care measures that may prevent or decrease complications (Diabetes Survival Skills Education) will improve Outcome: Progressing Goal: Individualized Educational Video(s) Outcome: Progressing   Problem: Coping: Goal: Ability to adjust to condition or change in health will improve Outcome: Progressing   Problem: Fluid Volume: Goal: Ability to maintain a balanced intake and output will improve Outcome: Progressing   Problem: Health Behavior/Discharge Planning: Goal: Ability to identify and utilize available resources and services will improve Outcome: Progressing Goal: Ability to manage health-related needs will improve Outcome:  Progressing   Problem: Metabolic: Goal: Ability to maintain appropriate glucose levels will improve Outcome: Progressing   Problem: Nutritional: Goal: Maintenance of adequate nutrition will improve Outcome: Progressing Goal: Progress toward achieving an optimal weight will improve Outcome: Progressing   Problem: Skin Integrity: Goal: Risk for impaired skin integrity will decrease Outcome: Progressing   Problem: Tissue Perfusion: Goal: Adequacy of tissue perfusion will improve Outcome: Progressing

## 2021-07-22 NOTE — Consult Note (Signed)
Reason for Consult: Diabetic ulceration with cellulitis and abscess right hallux. Referring Physician: Mckay Notz is an 55 y.o. female.  HPI: This is a 55 year old female with diabetes and associated neuropathy with history of ulceration on her right great toe for at least the past month.  Recently noticed some increased redness and drainage.  States that she had some chills yesterday and presented today outpatient for evaluation.  Determined with the patient that with the extent of infection that she would need hospitalization for MRI and IV antibiotics and amputation of the great toe.  Past Medical History:  Diagnosis Date   Arthritis    knees   Blood clotting disorder ([place])    on toe    Degenerative disc disease, lumbar    Diabetes mellitus without complication (HCC)    GERD (gastroesophageal reflux disease)    Headache    daily - AM (has not been able to have SPG blocks lately)   Hyperlipidemia    Hypertension    Neuropathy    feet   Vertigo    3-4x/yr    Past Surgical History:  Procedure Laterality Date   ABDOMINAL HYSTERECTOMY     But still has cervix   CESAREAN SECTION     CHOLECYSTECTOMY     left arm frature  10/12/2020   OVARIAN CYST SURGERY     PARATHYROIDECTOMY  03/28/2021   Procedure: PARATHYROIDECTOMY AUTOTRANSPLANT;  Surgeon: Fredirick Maudlin, MD;  Location: [facility] ORS;  Service: General;;   SHOULDER SURGERY Left 12/26/014   Dr. Little Ishikawa, [facility]   THYROIDECTOMY N/A 03/28/2021   Procedure: THYROIDECTOMY, total;  Surgeon: Fredirick Maudlin, MD;  Location: [facility] ORS;  Service: General;  Laterality: N/A;  Provider requesting 3 hours /180 minutes for procedure   TONSILLECTOMY AND ADENOIDECTOMY     UTERINE FIBROID SURGERY      Family History  Problem Relation Age of Onset   Diabetes Father    Heart disease Father    Hypertension Father    Hyperlipidemia Father    Congestive Heart Failure Father    Healthy Brother    Osteoporosis Mother    Irritable  bowel syndrome Mother    Osteoporosis Maternal Grandmother     Social History:  reports that she quit smoking about 9 years ago. She has never used smokeless tobacco. She reports that she does not drink alcohol and does not use drugs.  Allergies:  Allergies  Allergen Reactions   Celecoxib Shortness Of Breath   Pregabalin Shortness Of Breath   Buspar [Buspirone] Other (See Comments)    Tachycardia   Citalopram Cough    Medications: Scheduled:  atorvastatin  20 mg Oral QHS   calcium carbonate  2 tablet Oral TID WC   dextromethorphan-guaiFENesin  1 tablet Oral BID   gabapentin  800 mg Oral TID   heparin  5,000 Units Subcutaneous Q8H   insulin aspart  0-5 Units Subcutaneous QHS   insulin aspart  0-9 Units Subcutaneous TID WC   [START ON 07/23/2021] levothyroxine  150 mcg Oral Q0600   lisinopril  5 mg Oral QHS   melatonin  10 mg Oral QHS   nortriptyline  50 mg Oral QHS   pantoprazole  40 mg Oral Daily    Results for orders placed or performed during the hospital encounter of 07/22/21 (from the past 48 hour(s))  CBC     Status: Abnormal   Collection Time: 07/22/21 11:55 AM  Result Value Ref Range   WBC 15.2 (H)  4.0 - 10.5 K/uL   RBC 4.14 3.87 - 5.11 MIL/uL   Hemoglobin 12.6 12.0 - 15.0 g/dL   HCT 36.3 36.0 - 46.0 %   MCV 87.7 80.0 - 100.0 fL   MCH 30.4 26.0 - 34.0 pg   MCHC 34.7 30.0 - 36.0 g/dL   RDW 13.1 11.5 - 15.5 %   Platelets 436 (H) 150 - 400 K/uL   nRBC 0.0 0.0 - 0.2 %    Comment: Performed at [facility], [address] [place], [place] [id number]  Basic metabolic panel     Status: Abnormal   Collection Time: 07/22/21 11:55 AM  Result Value Ref Range   Sodium 130 (L) 135 - 145 mmol/L   Potassium 3.5 3.5 - 5.1 mmol/L   Chloride 88 (L) 98 - 111 mmol/L   CO2 31 22 - 32 mmol/L   Glucose, Bld 279 (H) 70 - 99 mg/dL    Comment: Glucose reference range applies only to samples taken after fasting for at least 8 hours.   BUN 9 6 - 20 mg/dL   Creatinine, Ser  0.65 0.44 - 1.00 mg/dL   Calcium 7.7 (L) 8.9 - 10.3 mg/dL   GFR, Estimated >60 >60 mL/min    Comment: (NOTE) Calculated using the CKD-EPI Creatinine Equation (2021)    Anion gap 11 5 - 15    Comment: Performed at [facility], [address] [place], [place] [address]  Brain natriuretic peptide     Status: None   Collection Time: 07/22/21 11:55 AM  Result Value Ref Range   B Natriuretic Peptide 46.8 0.0 - 100.0 pg/mL    Comment: Performed at [facility], [address] [place], [place] [address]  Resp Panel by RT-PCR (Flu A&B, Covid) Nasopharyngeal Swab     Status: Abnormal   Collection Time: 07/22/21 12:57 PM   Specimen: Nasopharyngeal Swab; Nasopharyngeal(NP) swabs in vial transport medium  Result Value Ref Range   SARS Coronavirus 2 by RT PCR POSITIVE (A) NEGATIVE    Comment: RESULT CALLED TO, READ BACK BY AND VERIFIED WITH: ADDISON GREGORY 07/22/21 @ 1403 BY S BEARD (NOTE) SARS-CoV-2 target nucleic acids are DETECTED.  The SARS-CoV-2 RNA is generally detectable in upper respiratory specimens during the acute phase of infection. Positive results are indicative of the presence of the identified virus, but do not rule out bacterial infection or co-infection with other pathogens not detected by the test. Clinical correlation with patient history and other diagnostic information is necessary to determine patient infection status. The expected result is Negative.  Fact Sheet for Patients: EntrepreneurPulse.com.au  Fact Sheet for Healthcare Providers: IncredibleEmployment.be  This test is not yet approved or cleared by the [place] FDA and  has been authorized for detection and/or diagnosis of SARS-CoV-2 by FDA under an Emergency Use Authorization (EUA).  This EUA will remain in effect (meaning this test  can be used) for the duration of  the COVID-19 declaration under Section 564(b)(1) of the Act, 21 U.S.C. section  360bbb-3(b)(1), unless the authorization is terminated or revoked sooner.     Influenza A by PCR NEGATIVE NEGATIVE   Influenza B by PCR NEGATIVE NEGATIVE    Comment: (NOTE) The Xpert Xpress SARS-CoV-2/FLU/RSV plus assay is intended as an aid in the diagnosis of influenza from Nasopharyngeal swab specimens and should not be used as a sole basis for treatment. Nasal washings and aspirates are unacceptable for Xpert Xpress SARS-CoV-2/FLU/RSV testing.  Fact Sheet for Patients: EntrepreneurPulse.com.au  Fact Sheet for Healthcare Providers: IncredibleEmployment.be  This test is not yet approved or cleared by the [place] FDA and has been authorized for detection and/or diagnosis of SARS-CoV-2 by FDA under an Emergency Use Authorization (EUA). This EUA will remain in effect (meaning this test can be used) for the duration of the COVID-19 declaration under Section 564(b)(1) of the Act, 21 U.S.C. section 360bbb-3(b)(1), unless the authorization is terminated or revoked.  Performed at [facility], [address] [place], [place] [address]   Lactic acid, plasma     Status: Abnormal   Collection Time: 07/22/21 12:57 PM  Result Value Ref Range   Lactic Acid, Venous 2.9 (HH) 0.5 - 1.9 mmol/L    Comment: CRITICAL RESULT CALLED TO, READ BACK BY AND VERIFIED WITH  Rosemary Holms, RN AT 1328 07/22/21 BY [facility] Performed at [facility], [address] [place], [place] [address]   Lactic acid, plasma     Status: Abnormal   Collection Time: 07/22/21  3:15 PM  Result Value Ref Range   Lactic Acid, Venous 2.4 (HH) 0.5 - 1.9 mmol/L    Comment: CRITICAL VALUE NOTED. VALUE IS CONSISTENT WITH PREVIOUSLY REPORTED/CALLED VALUE JRH Performed at [facility], [address] [place], [place] [address]   Protime-INR     Status: None   Collection Time: 07/22/21  3:15 PM  Result Value Ref Range   Prothrombin Time 13.4 11.4 - 15.2 seconds    INR 1.0 0.8 - 1.2    Comment: (NOTE) INR goal varies based on device and disease states. Performed at [facility], [address] [place], [place] [address]   APTT     Status: None   Collection Time: 07/22/21  3:15 PM  Result Value Ref Range   aPTT 30 24 - 36 seconds    Comment: Performed at [facility], [address] [place], [place] [address]  Sedimentation rate     Status: Abnormal   Collection Time: 07/22/21  3:15 PM  Result Value Ref Range   Sed Rate 88 (H) 0 - 30 mm/hr    Comment: Performed at [facility], [address] [place],  [address]  Procalcitonin     Status: None   Collection Time: 07/22/21  3:15 PM  Result Value Ref Range   Procalcitonin 0.31 ng/mL    Comment:        Interpretation: PCT (Procalcitonin) <= 0.5 ng/mL: Systemic infection (sepsis) is not likely. Local bacterial infection is possible. (NOTE)       Sepsis PCT Algorithm           Lower Respiratory Tract                                      Infection PCT Algorithm    ----------------------------     ----------------------------         PCT < 0.25 ng/mL                PCT < 0.10 ng/mL          Strongly encourage             Strongly discourage   discontinuation of antibiotics    initiation of antibiotics    ----------------------------     -----------------------------       PCT 0.25 - 0.50 ng/mL  PCT 0.10 - 0.25 ng/mL               OR       >80% decrease in PCT            Discourage initiation of                                            antibiotics      Encourage discontinuation           of antibiotics    ----------------------------     -----------------------------         PCT >= 0.50 ng/mL              PCT 0.26 - 0.50 ng/mL               AND        <80% decrease in PCT             Encourage initiation of                                             antibiotics       Encourage continuation           of antibiotics     ----------------------------     -----------------------------        PCT >= 0.50 ng/mL                  PCT > 0.50 ng/mL               AND         increase in PCT                  Strongly encourage                                      initiation of antibiotics    Strongly encourage escalation           of antibiotics                                     -----------------------------                                           PCT <= 0.25 ng/mL                                                 OR                                        > 80% decrease in PCT  Discontinue / Do not initiate                                             antibiotics  Performed at [facility], [address] [place], [place] [address]     No results found.  Review of Systems  Constitutional:  Positive for chills. Negative for fever.  HENT:  Negative for sinus pain and sore throat.   Respiratory:  Negative for choking and shortness of breath.   Cardiovascular:  Negative for chest pain and palpitations.  Gastrointestinal:  Negative for nausea and vomiting.  Endocrine: Negative for polydipsia and polyuria.  Genitourinary:  Negative for frequency and urgency.  Musculoskeletal:  Negative for arthralgias and myalgias.  Skin:        Patient relates an ulceration on her right great toe for the last month with recent increased redness and drainage.  Neurological:        Relates neuropathy associated with her diabetes.  Psychiatric/Behavioral:  Negative for confusion. The patient is not nervous/anxious.   Blood pressure 126/74, pulse 96, temperature 99 F (37.2 C), temperature source Oral, resp. rate 16, height '5\' 2"'$  (1.575 m), weight 102.1 kg, SpO2 100 %. Physical Exam Cardiovascular:     Comments: DP and PT pulses are diminished 1/4 on the right.  2/4 on the left. Musculoskeletal:     Comments: Adequate range of motion of the pedal joints.  Muscle testing deferred.   Skin:    Comments: Some edema is noted in the right foot with significant erythema and edema in the right hallux.  Large plantar ulceration beneath the hallux with a central area of full-thickness necrotic tissue probing down to the level of bone.  Neurological:     Comments: Loss of protective threshold with a monofilament wire in the forefoot and toes.       Assessment/Plan: Assessment: 1.  Full-thickness ulceration right hallux with evidence for osteomyelitis. 2.  Cellulitis with abscess right hallux. 3.  Diabetes with associated neuropathy.  Plan: Discussed with the patient that at this point we are waiting for MRI evaluation.  Discussed with the patient that she will most likely require at least amputation of the great toe.  Discussed with the patient that since she is COVID-positive this will most likely have to be last case of the day and at this point tentatively we will consider doing this tomorrow evening.  I will follow-up with the patient tomorrow for more complete planning.  Durward Fortes 07/22/2021, 4:39 PM

## 2021-07-22 NOTE — H&P (Addendum)
History and Physical    Lemma Tetro CBU:[id number] DOB: 10-09-1966 DOA: 07/22/2021  Referring MD/NP/PA:   PCP: [place]   Patient coming from:  The patient is coming from home.  At baseline, pt is independent for most of ADL.        Chief Complaint: diabetic foot ulcer with infection in right great toe  HPI: Gabriella Marsh is a 55 y.o. female with medical history significant of DM, peripheral diabetic neuropathy, hypertension, hyperlipidemia, GERD, hypothyroidism (s/p 4 thyroidectomy), depression with anxiety, vertigo, degenerative disc disease, RLS, chronic pain syndrome, who presents with a diabetic ulcer with infection in right great toe.  Patient states she has a chronic foot ulcer in right great toe for more than 1 month.  She has been followed up with podiatrist.  The foot ulcer has been progressively worsening, with drainage. Patient has peripheral neuropathy, does not feel pain.  She has chills, but no fever.  Denies chest pain, shortness breath, cough.  No nausea, vomiting, diarrhea or abdominal pain.  No symptoms of UTI.  Patient was seen by Dr. Cleda Mccreedy for podiatrist today, and sent to ED for IV antibiotic and MRI of foot for ruling out osteomyelitis.  ED Course: pt was found to have WBC 15.2, positive COVID-19 PCR,  renal function okay, temperature normal, blood pressure 117/73, heart rate 97, RR 18, oxygen saturation 100% on room air.  Patient is admitted to [facility] bed as inpatient.  Dr. Cleda Mccreedy of podiatry is consulted.  Review of Systems:   General: no fevers, has chills, no body weight gain, fatigue HEENT: no blurry vision, hearing changes or sore throat Respiratory: no dyspnea, coughing, wheezing CV: no chest pain, no palpitations GI: no nausea, vomiting, abdominal pain, diarrhea, constipation GU: no dysuria, burning on urination, increased urinary frequency, hematuria  Ext: has trace leg edema Neuro: no unilateral weakness, numbness, or  tingling, no vision change or hearing loss Skin: no rash. Has a chronic diabetic foot ulcer in right great toe MSK: No muscle spasm, no deformity, no limitation of range of movement in spin Heme: No easy bruising.  Travel history: No recent long distant travel.  Allergy:  Allergies  Allergen Reactions   Celecoxib Shortness Of Breath   Pregabalin Shortness Of Breath   Buspar [Buspirone] Other (See Comments)    Tachycardia   Citalopram Cough    Past Medical History:  Diagnosis Date   Arthritis    knees   Blood clotting disorder (HCC)    on toe    Degenerative disc disease, lumbar    Diabetes mellitus without complication (HCC)    GERD (gastroesophageal reflux disease)    Headache    daily - AM (has not been able to have SPG blocks lately)   Hyperlipidemia    Hypertension    Neuropathy    feet   Vertigo    3-4x/yr    Past Surgical History:  Procedure Laterality Date   ABDOMINAL HYSTERECTOMY     But still has cervix   CESAREAN SECTION     CHOLECYSTECTOMY     left arm frature  10/12/2020   OVARIAN CYST SURGERY     PARATHYROIDECTOMY  03/28/2021   Procedure: PARATHYROIDECTOMY AUTOTRANSPLANT;  Surgeon: Fredirick Maudlin, MD;  Location: [facility];  Service: General;;   SHOULDER SURGERY Left 12/26/014   Dr. Little Ishikawa, [place] N/A 03/28/2021   Procedure: THYROIDECTOMY, total;  Surgeon: Fredirick Maudlin, MD;  Location: [facility];  Service: General;  Laterality: N/A;  Provider requesting 3 hours /180 minutes for procedure   TONSILLECTOMY AND ADENOIDECTOMY     UTERINE FIBROID SURGERY      Social History:  reports that she quit smoking about 9 years ago. She has never used smokeless tobacco. She reports that she does not drink alcohol and does not use drugs.  Family History:  Family History  Problem Relation Age of Onset   Diabetes Father    Heart disease Father    Hypertension Father    Hyperlipidemia Father    Congestive Heart Failure Father    Healthy  Brother    Osteoporosis Mother    Irritable bowel syndrome Mother    Osteoporosis Maternal Grandmother      Prior to Admission medications   Medication Sig Start Date End Date Taking? Authorizing Provider  atorvastatin (LIPITOR) 20 MG tablet Take 1 tablet (20 mg total) by mouth daily. 07/17/21   Virginia Crews, MD  calcitRIOL (ROCALTROL) 0.25 MCG capsule Take 1 capsule (0.25 mcg total) by mouth daily. 04/02/21   Olean Ree, MD  calcium carbonate (OS-CAL - DOSED IN MG OF ELEMENTAL CALCIUM) 1250 (500 Ca) MG tablet Take 2 tablets (1,000 mg of elemental calcium total) by mouth 3 (three) times daily with meals. 04/01/21   Olean Ree, MD  clopidogrel (PLAVIX) 75 MG tablet Take 1 tablet (75 mg total) by mouth daily. 09/05/20   Kris Hartmann, NP  cyclobenzaprine (FLEXERIL) 5 MG tablet Take 1 tablet (5 mg total) by mouth 3 (three) times daily as needed for muscle spasms. 05/22/20   Mar Daring, PA-C  furosemide (LASIX) 20 MG tablet TAKE 2 TABLETS(40 MG) BY MOUTH DAILY AS NEEDED FOR FLUID RETENTION OR SWELLING Patient taking differently: Take 40 mg by mouth daily. 04/01/20   Mar Daring, PA-C  gabapentin (NEURONTIN) 100 MG capsule TAKE 2 CAPSULES THREE TIMES A DAY ALONG WITH 600 MG THREE TIMES A DAY FOR A TOTAL DAILY DOSE OF 2400 MG DAILY 07/17/21   Bacigalupo, Dionne Bucy, MD  gabapentin (NEURONTIN) 600 MG tablet TAKE 1 TABLET BY MOUTH 3  TIMES DAILY 07/05/21   Bacigalupo, Dionne Bucy, MD  glipiZIDE (GLUCOTROL) 10 MG tablet TAKE ONE-HALF TABLET BY  MOUTH TWICE DAILY BEFORE  MEALS Patient taking differently: Take 10 mg by mouth 2 (two) times daily before a meal. 11/25/20   Burnette, Clearnce Sorrel, PA-C  ibuprofen (ADVIL) 600 MG tablet Take 1 tablet (600 mg total) by mouth every 6 (six) hours as needed for mild pain. 04/02/21   Fredirick Maudlin, MD  insulin degludec (TRESIBA FLEXTOUCH) 100 UNIT/ML FlexTouch Pen Inject into the skin daily.    [provider]  levothyroxine (SYNTHROID)  150 MCG tablet Take 1 tablet (150 mcg total) by mouth daily at 6 (six) AM. 03/30/21   Fredirick Maudlin, MD  lisinopril (ZESTRIL) 5 MG tablet TAKE 1 TABLET(5 MG) BY MOUTH DAILY 06/12/21   Birdie Sons, MD  Melatonin 10 MG TABS Take 10 mg by mouth at bedtime.    [provider]  metFORMIN (GLUCOPHAGE) 500 MG tablet Take 2 tablets (1,000 mg total) by mouth 2 (two) times daily with a meal. 06/17/21   Bacigalupo, Dionne Bucy, MD  nortriptyline (PAMELOR) 25 MG capsule Take 2 capsules (50 mg total) by mouth at bedtime. 04/29/21   Virginia Crews, MD  omeprazole (PRILOSEC) 20 MG capsule Take 1 capsule (20 mg total) by mouth daily. 07/17/21   Virginia Crews, MD  oxyCODONE-acetaminophen (PERCOCET/ROXICET) [phone]  MG tablet Take 1 tablet by mouth every 6 (six) hours as needed for severe pain. 07/17/21   Virginia Crews, MD  traZODone (DESYREL) 50 MG tablet TAKE 1 TO 2 TABLETS(50 TO 100 MG) BY MOUTH AT BEDTIME AS NEEDED FOR SLEEP 04/30/21   Virginia Crews, MD  triamterene-hydrochlorothiazide (MAXZIDE-25) 37.5-25 MG tablet TAKE 1 TABLET BY MOUTH  DAILY 04/14/21   Virginia Crews, MD  TRULICITY 1.5 ZO/1.0RU SOPN ADMINISTER 1.5 MG UNDER THE SKIN 1 TIME A WEEK 05/09/21   Virginia Crews, MD    Physical Exam: Vitals:   07/22/21 1152  BP: 117/73  Pulse: 97  Resp: 18  Temp: 98.1 F (36.7 C)  TempSrc: Oral  SpO2: 100%  Weight: 102.1 kg  Height: _0  (1.575 m)   General: Not in acute distress HEENT:       Eyes: PERRL, EOMI, no scleral icterus.       ENT: No discharge from the ears and nose, no pharynx injection, no tonsillar enlargement.        Neck: No JVD, no bruit, no mass felt. Heme: No neck lymph node enlargement. Cardiac: S1/S2, RRR, No murmurs, No gallops or rubs. Respiratory: No rales, wheezing, rhonchi or rubs. GI: Soft, nondistended, nontender, no rebound pain, no organomegaly, BS present. GU: No hematuria Ext: has trace leg edema bilaterally. 1+DP/PT pulse  bilaterally. Musculoskeletal: No joint deformities, No joint redness or warmth, no limitation of ROM in spin. Skin: No rashes.   Has a diabetic foot ulcer in right great toe, with surrounding erythema, no tenderness, with serosanguineous drainage          Neuro: Alert, oriented X3, cranial nerves II-XII grossly intact, moves all extremities normally. Psych: Patient is not psychotic, no suicidal or hemocidal ideation.  Labs on Admission: I have personally reviewed following labs and imaging studies  CBC: Recent Labs  Lab 07/22/21 1155  WBC 15.2*  HGB 12.6  HCT 36.3  MCV 87.7  PLT 045*   Basic Metabolic Panel: Recent Labs  Lab 07/22/21 1155  NA 130*  K 3.5  CL 88*  CO2 31  GLUCOSE 279*  BUN 9  CREATININE 0.65  CALCIUM 7.7*   GFR: Estimated Creatinine Clearance: 90 mL/min (by C-G formula based on SCr of 0.65 mg/dL). Liver Function Tests: No results for input(s): AST, ALT, ALKPHOS, BILITOT, PROT, ALBUMIN in the last 168 hours. No results for input(s): LIPASE, AMYLASE in the last 168 hours. No results for input(s): AMMONIA in the last 168 hours. Coagulation Profile: No results for input(s): INR, PROTIME in the last 168 hours. Cardiac Enzymes: No results for input(s): CKTOTAL, CKMB, CKMBINDEX, TROPONINI in the last 168 hours. BNP (last 3 results) No results for input(s): PROBNP in the last 8760 hours. HbA1C: No results for input(s): HGBA1C in the last 72 hours. CBG: No results for input(s): GLUCAP in the last 168 hours. Lipid Profile: No results for input(s): CHOL, HDL, LDLCALC, TRIG, CHOLHDL, LDLDIRECT in the last 72 hours. Thyroid Function Tests: No results for input(s): TSH, T4TOTAL, FREET4, T3FREE, THYROIDAB in the last 72 hours. Anemia Panel: No results for input(s): VITAMINB12, FOLATE, FERRITIN, TIBC, IRON, RETICCTPCT in the last 72 hours. Urine analysis:    Component Value Date/Time   COLORURINE BROWN (A) 09/30/2016 1840   APPEARANCEUR TURBID (A)  09/30/2016 1840   LABSPEC >1.030 (H) 09/30/2016 1840   PHURINE 5.0 09/30/2016 1840   GLUCOSEU 250 (A) 09/30/2016 1840   HGBUR LARGE (A) 09/30/2016 1840   BILIRUBINUR  Negative 05/22/2020 0839   KETONESUR 15 (A) 09/30/2016 1840   PROTEINUR Negative 05/22/2020 0839   PROTEINUR >300 (A) 09/30/2016 1840   UROBILINOGEN 0.2 05/22/2020 0839   NITRITE Negative 05/22/2020 0839   NITRITE NEGATIVE 09/30/2016 1840   LEUKOCYTESUR Negative 05/22/2020 0839   Sepsis Labs: _0 (procalcitonin:4,lacticidven:4) ) Recent Results (from the past 240 hour(s))  Resp Panel by RT-PCR (Flu A&B, Covid) Nasopharyngeal Swab     Status: Abnormal   Collection Time: 07/22/21 12:57 PM   Specimen: Nasopharyngeal Swab; Nasopharyngeal(NP) swabs in vial transport medium  Result Value Ref Range Status   SARS Coronavirus 2 by RT PCR POSITIVE (A) NEGATIVE Final    Comment: RESULT CALLED TO, READ BACK BY AND VERIFIED WITH: ADDISON GREGORY 07/22/21 @ 1403 BY S BEARD (NOTE) SARS-CoV-2 target nucleic acids are DETECTED.  The SARS-CoV-2 RNA is generally detectable in upper respiratory specimens during the acute phase of infection. Positive results are indicative of the presence of the identified virus, but do not rule out bacterial infection or co-infection with other pathogens not detected by the test. Clinical correlation with patient history and other diagnostic information is necessary to determine patient infection status. The expected result is Negative.  Fact Sheet for Patients: EntrepreneurPulse.com.au  Fact Sheet for Healthcare Providers: IncredibleEmployment.be  This test is not yet approved or cleared by the [place] FDA and  has been authorized for detection and/or diagnosis of SARS-CoV-2 by FDA under an Emergency Use Authorization (EUA).  This EUA will remain in effect (meaning this test  can be used) for the duration of  the COVID-19 declaration under Section  564(b)(1) of the Act, 21 U.S.C. section 360bbb-3(b)(1), unless the authorization is terminated or revoked sooner.     Influenza A by PCR NEGATIVE NEGATIVE Final   Influenza B by PCR NEGATIVE NEGATIVE Final    Comment: (NOTE) The Xpert Xpress SARS-CoV-2/FLU/RSV plus assay is intended as an aid in the diagnosis of influenza from Nasopharyngeal swab specimens and should not be used as a sole basis for treatment. Nasal washings and aspirates are unacceptable for Xpert Xpress SARS-CoV-2/FLU/RSV testing.  Fact Sheet for Patients: EntrepreneurPulse.com.au  Fact Sheet for Healthcare Providers: IncredibleEmployment.be  This test is not yet approved or cleared by the [place] FDA and has been authorized for detection and/or diagnosis of SARS-CoV-2 by FDA under an Emergency Use Authorization (EUA). This EUA will remain in effect (meaning this test can be used) for the duration of the COVID-19 declaration under Section 564(b)(1) of the Act, 21 U.S.C. section 360bbb-3(b)(1), unless the authorization is terminated or revoked.  Performed at [facility], [address], [place], [place] [address]      Radiological Exams on Admission: No results found.   EKG: Not done in ED, will get one.   Assessment/Plan Principal Problem:   Diabetic foot infection ([place]) Active Problems:   Diabetic neuropathy ([place])   Essential hypertension   GERD (gastroesophageal reflux disease)   Type 2 diabetes mellitus with diabetic neuropathy, without long-term current use of insulin (HCC)   Hypercholesteremia   Chronic pain syndrome   Depression with anxiety   Sepsis ([place])   COVID-19 virus infection   Sepsis due to diabetic foot infection-right great toe ulcer: Patient meets criteria for sepsis with leukocytosis with WBC 15.2, tachycardia with heart rate in 97.  Pending lactic acid.  No fever.  Currently hemodynamically stable. Consulted Dr. Cleda Mccreedy of  podiatry.  - Admitted to MedSurg bed as inpatient - Empiric antimicrobial treatment with vancomycin  and Rocephine - PRN Zofran for nausea,  - Blood cultures x 2  - ESR and CRP - wound care consult - MRI-right foot  - will get Procalcitonin and trend lactic acid levels per sepsis protocol. - IVF: 1.0 L of NS bolus in ED, followed by 75 cc/h - INR/PTT - Hold Plavix in case patient needs surgery (patient is not taking Plavix currently)  Essential hypertension: Blood pressure 117/73 -Hold lasix and Maxide since patient need IV fluid -IV hydralazine as needed -Continue lisinopril  GERD (gastroesophageal reflux disease) -Protonix  Type 2 diabetes mellitus with diabetic neuropathy, without long-term current use of insulin ([place]): Patient is taking metformin, Trulicity and glipizide at home -SSI  Hypercholesteremia -Lipitor  Depression with anxiety -Nortriptyline  Peripheral diabetic neuropathy: -Continue home Neurontin  Chronic pain syndrome: -Continue home Norco -As needed Tylenol  COVID-19 infection: Patient has positive COVID-19 test, but asymptomatic.  No chest pain, cough, shortness of breath.  No fever. -As needed albuterol and Mucinex if patient develops symptoms -f/u CXR      DVT ppx: SQ Heparin    Code Status: Full code Family Communication: not done, no family member is at bed side.     Disposition Plan:  Anticipate discharge back to previous environment Consults called:  Dr. Cleda Mccreedy Admission status and Level of care: Med-Surg:    as inpt      Status is: Inpatient  Remains inpatient appropriate because:Inpatient level of care appropriate due to severity of illness  Dispo: The patient is from: Home              Anticipated d/c is to: Home              Patient currently is not medically stable to d/c.   Difficult to place patient No           Date of Service 07/22/2021    Ivor Costa [facility]   If 7PM-7AM, please contact  night-coverage [email or web address] 07/22/2021, 2:28 PM

## 2021-07-23 DIAGNOSIS — E11628 Type 2 diabetes mellitus with other skin complications: Secondary | ICD-10-CM

## 2021-07-23 DIAGNOSIS — G894 Chronic pain syndrome: Secondary | ICD-10-CM

## 2021-07-23 DIAGNOSIS — L089 Local infection of the skin and subcutaneous tissue, unspecified: Secondary | ICD-10-CM

## 2021-07-23 DIAGNOSIS — E1142 Type 2 diabetes mellitus with diabetic polyneuropathy: Secondary | ICD-10-CM

## 2021-07-23 DIAGNOSIS — U071 COVID-19: Secondary | ICD-10-CM

## 2021-07-23 LAB — CBC
HCT: 32 % — ABNORMAL LOW (ref 36.0–46.0)
Hemoglobin: 10.7 g/dL — ABNORMAL LOW (ref 12.0–15.0)
MCH: 29.5 pg (ref 26.0–34.0)
MCHC: 33.4 g/dL (ref 30.0–36.0)
MCV: 88.2 fL (ref 80.0–100.0)
Platelets: 409 10*3/uL — ABNORMAL HIGH (ref 150–400)
RBC: 3.63 MIL/uL — ABNORMAL LOW (ref 3.87–5.11)
RDW: 13 % (ref 11.5–15.5)
WBC: 10.7 10*3/uL — ABNORMAL HIGH (ref 4.0–10.5)
nRBC: 0 % (ref 0.0–0.2)

## 2021-07-23 LAB — GLUCOSE, CAPILLARY
Glucose-Capillary: 229 mg/dL — ABNORMAL HIGH (ref 70–99)
Glucose-Capillary: 205 mg/dL — ABNORMAL HIGH (ref 70–99)
Glucose-Capillary: 233 mg/dL — ABNORMAL HIGH (ref 70–99)
Glucose-Capillary: 189 mg/dL — ABNORMAL HIGH (ref 70–99)

## 2021-07-23 LAB — CREATININE, SERUM
Creatinine, Ser: 0.66 mg/dL (ref 0.44–1.00)
GFR, Estimated: >60 mL/min (ref >60)

## 2021-07-23 LAB — C-REACTIVE PROTEIN: CRP: 14.4 mg/dL — ABNORMAL HIGH (ref <1.0)

## 2021-07-23 NOTE — H&P (View-Only) (Signed)
Subjective/Chief Complaint: Patient seen.  Doing okay.  Somewhat tired.   Objective: Vital signs in last 24 hours: Temp:  [98.5 F (36.9 C)-99 F (37.2 C)] 98.6 F (37 C) (08/03 1123) Pulse Rate:  [84-96] 84 (08/03 1123) Resp:  [16-17] 16 (08/03 1123) BP: (92-126)/(48-74) 93/50 (08/03 1123) SpO2:  [93 %-100 %] 100 % (08/03 1123) Last BM Date: 07/21/21  Intake/Output from previous day: 08/02 0701 - 08/03 0700 In: 1081.5 [I.V.:24.4; IV Piggyback:1057.1] Out: 0  Intake/Output this shift: No intake/output data recorded.  Bandage on the right foot left intact.  MRI results were reviewed which reveal evidence for osteomyelitis in the distal phalanx of the hallux.  White count trending downward.  Lab Results:  Recent Labs    07/22/21 1155 07/23/21 0354  WBC 15.2* 10.7*  HGB 12.6 10.7*  HCT 36.3 32.0*  PLT 436* 409*   BMET Recent Labs    07/22/21 1155 07/23/21 0354  NA 130*  --   K 3.5  --   CL 88*  --   CO2 31  --   GLUCOSE 279*  --   BUN 9  --   CREATININE 0.65 0.66  CALCIUM 7.7*  --    PT/INR Recent Labs    07/22/21 1515  LABPROT 13.4  INR 1.0   ABG No results for input(s): PHART, HCO3 in the last 72 hours.  Invalid input(s): PCO2, PO2  Studies/Results: MR FOOT RIGHT W WO CONTRAST  Result Date: 07/22/2021 CLINICAL DATA:  Foot swelling, diabetic, osteomyelitis suspected, xray done EXAM: MRI OF THE RIGHT FOREFOOT WITHOUT AND WITH CONTRAST TECHNIQUE: Multiplanar, multisequence MR imaging of the right forefoot was performed before and after the administration of intravenous contrast. CONTRAST:  51m GADAVIST GADOBUTROL 1 MMOL/ML IV SOLN COMPARISON:  None. FINDINGS: Bones/Joint/Cartilage There is no evidence of acute fracture. There is edema signal and enhancement throughout the distal phalanx of the great toe, with preserved T1 marrow signal. Ligaments Intact Lisfranc ligament.  No evidence of plantar plate tear Muscles and Tendons Mild muscle atrophy  throughout the foot. No evidence of acute tendon tear. Soft tissues There is a plantar soft tissue ulcer along the distal aspect of the great toe. There is distal great toe soft tissue swelling and skin thickening. There is a nonenhancing T2 hyperintense cystic lesion along the plantar lateral soft tissues of the midfoot compatible with a ganglion cyst measuring 1.2 x 0.7 cm (axial T2 image 47). There is extensive soft tissue swelling, predominantly superficially along the dorsal foot. IMPRESSION: Plantar soft tissue ulcer along the distal aspect of the great toe. Bony edema and enhancement in the great toe distal phalanx with preserved T1 marrow signal. Given the adjacent ulcer, this probably represents early osteomyelitis, though reactive marrow signal can also have this appearance. 1.2 cm ganglion cyst along the plantar soft tissues of the midfoot. Electronically Signed   By: JMaurine Simmering  On: 07/22/2021 18:56   DG Chest Port 1 View  Result Date: 07/22/2021 CLINICAL DATA:  COVID toe infection EXAM: PORTABLE CHEST 1 VIEW COMPARISON:  12/22/2016, 04/01/2020 FINDINGS: No focal opacity or pleural effusion. Normal cardiac size. No pneumothorax. IMPRESSION: No active disease. Electronically Signed   By: KDonavan FoilM.D.   On: 07/22/2021 16:41    Anti-infectives: Anti-infectives (From admission, onward)    Start     Dose/Rate Route Frequency Ordered Stop   07/23/21 1600  vancomycin (VANCOREADY) IVPB 1500 mg/300 mL        1,500 mg  150 mL/hr over 120 Minutes Intravenous Every 24 hours 07/22/21 1453     07/22/21 1500  cefTRIAXone (ROCEPHIN) 2 g in sodium chloride 0.9 % 100 mL IVPB        2 g 200 mL/hr over 30 Minutes Intravenous Every 24 hours 07/22/21 1403     07/22/21 1445  vancomycin (VANCOREADY) IVPB 1500 mg/300 mL       See Hyperspace for full Linked Orders Report.   1,500 mg 150 mL/hr over 120 Minutes Intravenous  Once 07/22/21 1341     07/22/21 1345  vancomycin (VANCOCIN) IVPB 1000 mg/200 mL  premix       See Hyperspace for full Linked Orders Report.   1,000 mg 200 mL/hr over 60 Minutes Intravenous  Once 07/22/21 1341     07/22/21 1315  vancomycin (VANCOCIN) 2,500 mg in sodium chloride 0.9 % 500 mL IVPB  Status:  Discontinued        2,500 mg 250 mL/hr over 120 Minutes Intravenous  Once 07/22/21 1302 07/22/21 1341   07/22/21 1300  vancomycin (VANCOREADY) IVPB 2000 mg/400 mL  Status:  Discontinued        2,000 mg 200 mL/hr over 120 Minutes Intravenous  Once 07/22/21 1251 07/22/21 1302   07/22/21 1300  piperacillin-tazobactam (ZOSYN) IVPB 4.5 g  Status:  Discontinued        4.5 g 200 mL/hr over 30 Minutes Intravenous  Once 07/22/21 1251 07/22/21 1315       Assessment/Plan: s/p Procedure(s): AMPUTATION TOE (Right) Assessment: Osteomyelitis right hallux  Plan: Again discussed with the patient amputation of her right great toe.  Operating room is extremely busy today going late into the evening.  Discussed with the patient waiting until tomorrow to perform her amputation.  Plan at this point would be for amputation of the right great toe after work.  Discussed with the patient possible risks and complications of the procedure including inability of the foot to heal due to her diabetes or extent of infection.  No guarantees can be given as to healing potential.  Consent form for amputation right great toe with possible partial first ray resection.  N.p.o. after early breakfast tomorrow.  LOS: 1 day    Durward Fortes 07/23/2021

## 2021-07-23 NOTE — Progress Notes (Signed)
Inpatient Diabetes Program Recommendations  AACE/ADA: New Consensus Statement on Inpatient Glycemic Control (2015)  Target Ranges:  Prepandial:   less than 140 mg/dL      Peak postprandial:   less than 180 mg/dL (1-2 hours)      Critically ill patients:  140 - 180 mg/dL   Results for Gabriella Marsh, Gabriella Marsh (MRN [id number]) as of 07/23/2021 12:48  Ref. Range 07/23/2021 07:52 07/23/2021 11:30  Glucose-Capillary Latest Ref Range: 70 - 99 mg/dL 229 (H) 205 (H)     Home DM Meds: Glipizide 10 mg BID    Metformin 123XX123 mg BID    Trulicity 1.5 mg Qweek    Tresiba (NOT taking Tresiba)   Current Orders: Novolog 0-9 units TID ac/hs      MD- Note AM CBG elevated today.  Home Meds currently on hold.  If AM CBG elevated again tomorrow AM, please consider starting Semglee 10 units Daily (0.1 units/kg)    --Will follow patient during hospitalization--  Wyn Quaker RN, MSN, CDE Diabetes Coordinator Inpatient Glycemic Control Team Team Pager: [phone] (8a-5p)

## 2021-07-23 NOTE — Progress Notes (Addendum)
PROGRESS NOTE        PATIENT DETAILS Name: Gabriella Marsh Age: 55 y.o. Sex: female Date of Birth: [birth year] Admit Date: 07/22/2021 Admitting Physician Ivor Costa, MD [id number] [facility]  Brief Narrative: Patient is a 55 y.o. female with history of DM, peripheral neuropathy, HTN, HLD, GERD, hypothyroidism-who presented with diabetic foot ulcer involving right great toe-further evaluation with MRI showed underlying osteomyelitis.  Significant events: 8/2>> admit for diabetic right toe infection with underlying osteomyelitis.  Significant studies: 8/2>> CXR: No active disease 8/2>> MRI right foot: Bony edema/enhancement of great toe distal phalanx-likely early osteomyelitis  Antimicrobial therapy: Rocephin: 8/2>> Vancomycin: 8/3>>  Microbiology data: 8/2>> blood culture: Negative 8/2>> wound swab: Pending  Procedures : None  Consults: Podiatry  DVT Prophylaxis : heparin injection 5,000 Units Start: 07/22/21 1400   Subjective: Lying comfortably in bed-denies any chest pain or shortness of breath.   Assessment/Plan: Sepsis due to right diabetic toe infection with underlying osteomyelitis: Sepsis physiology has resolved-continue IV antibiotics-podiatry planning on amputation on 8/4.    COVID-19 infection: Asymptomatic-watch closely for now.  HTN: BP stable- lisinopril-diuretics remain on hold-resume when able  DM-2 (A1c 9.4 on 3/16): CBGs relatively stable-was n.p.o. the whole day for potential amputation on 8/3-amputation now scheduled for 8/4-continue SSI for now-once amputation completed and diet resumed-we will begin long-acting insulin.  Recent Labs    07/23/21 0752 07/23/21 1130 07/23/21 1640  GLUCAP 229* 205* 233*   GERD: Continue PPI  Hypothyroidism: Continue Synthroid  HLD: On statin  Peripheral neuropathy: Continue Neurontin  Depression with anxiety: Appears stable-continue nortriptyline  Morbid  Obesity: Estimated body mass index is 41.15 kg/m as calculated from the following:   Height as of this encounter: '5\' 2"'$  (1.575 m).   Weight as of this encounter: 102.1 kg.    Diet: Diet Order             Diet Carb Modified Fluid consistency: Thin; Room service appropriate? Yes  Diet effective now                    Code Status: Full code   Family Communication: None nat bedside  Disposition Plan: Status is: Inpatient  Remains inpatient appropriate because:Inpatient level of care appropriate due to severity of illness  Dispo: The patient is from: Home              Anticipated d/c is to: Home              Patient currently is not medically stable to d/c.   Difficult to place patient No   Barriers to Discharge: Right toe diabetic foot with underlying osteomyelitis-scheduled for toe amputation on 8/4-currently on IV antimicrobial therapy  Antimicrobial agents: Anti-infectives (From admission, onward)    Start     Dose/Rate Route Frequency Ordered Stop   07/23/21 1600  vancomycin (VANCOREADY) IVPB 1500 mg/300 mL        1,500 mg 150 mL/hr over 120 Minutes Intravenous Every 24 hours 07/22/21 1453     07/22/21 1500  cefTRIAXone (ROCEPHIN) 2 g in sodium chloride 0.9 % 100 mL IVPB        2 g 200 mL/hr over 30 Minutes Intravenous Every 24 hours 07/22/21 1403     07/22/21 1445  vancomycin (VANCOREADY) IVPB 1500 mg/300 mL       See Hyperspace for  full Linked Orders Report.   1,500 mg 150 mL/hr over 120 Minutes Intravenous  Once 07/22/21 1341 07/23/21 1557   07/22/21 1345  vancomycin (VANCOCIN) IVPB 1000 mg/200 mL premix       See Hyperspace for full Linked Orders Report.   1,000 mg 200 mL/hr over 60 Minutes Intravenous  Once 07/22/21 1341     07/22/21 1315  vancomycin (VANCOCIN) 2,500 mg in sodium chloride 0.9 % 500 mL IVPB  Status:  Discontinued        2,500 mg 250 mL/hr over 120 Minutes Intravenous  Once 07/22/21 1302 07/22/21 1341   07/22/21 1300  vancomycin  (VANCOREADY) IVPB 2000 mg/400 mL  Status:  Discontinued        2,000 mg 200 mL/hr over 120 Minutes Intravenous  Once 07/22/21 1251 07/22/21 1302   07/22/21 1300  piperacillin-tazobactam (ZOSYN) IVPB 4.5 g  Status:  Discontinued        4.5 g 200 mL/hr over 30 Minutes Intravenous  Once 07/22/21 1251 07/22/21 1315        Time spent: 35 minutes-Greater than 50% of this time was spent in counseling, explanation of diagnosis, planning of further management, and coordination of care.  MEDICATIONS: Scheduled Meds:  atorvastatin  20 mg Oral QHS   calcium carbonate  2 tablet Oral TID WC   dextromethorphan-guaiFENesin  1 tablet Oral BID   gabapentin  800 mg Oral TID   heparin  5,000 Units Subcutaneous Q8H   insulin aspart  0-5 Units Subcutaneous QHS   insulin aspart  0-9 Units Subcutaneous TID WC   levothyroxine  150 mcg Oral Q0600   melatonin  10 mg Oral QHS   nortriptyline  50 mg Oral QHS   pantoprazole  40 mg Oral Daily   Continuous Infusions:  sodium chloride 75 mL/hr at 07/22/21 1523   cefTRIAXone (ROCEPHIN)  IV 2 g (07/22/21 1542)   vancomycin     vancomycin     PRN Meds:.acetaminophen (TYLENOL) oral liquid 160 mg/5 mL, albuterol, cyclobenzaprine, hydrALAZINE, ondansetron (ZOFRAN) IV, oxyCODONE-acetaminophen, traZODone   PHYSICAL EXAM: Vital signs: Vitals:   07/22/21 2253 07/23/21 0809 07/23/21 1123 07/23/21 1500  BP: 120/66 (!) 92/48 (!) 93/50 (!) 100/59  Pulse:   84 84  Resp: '17 16 16 15  '$ Temp: 98.7 F (37.1 C) 98.6 F (37 C) 98.6 F (37 C) 98.3 F (36.8 C)  TempSrc: Oral Oral    SpO2:  93% 100% 98%  Weight:      Height:       Filed Weights   07/22/21 1152  Weight: 102.1 kg   Body mass index is 41.15 kg/m.   Gen Exam:Alert awake-not in any distress HEENT:atraumatic, normocephalic Chest: B/L clear to auscultation anteriorly CVS:S1S2 regular Abdomen:soft non tender, non distended Extremities:no edema Neurology: Non focal Skin: no rash  I have  personally reviewed following labs and imaging studies  LABORATORY DATA: CBC: Recent Labs  Lab 07/22/21 1155 07/23/21 0354  WBC 15.2* 10.7*  HGB 12.6 10.7*  HCT 36.3 32.0*  MCV 87.7 88.2  PLT 436* 409*    Basic Metabolic Panel: Recent Labs  Lab 07/22/21 1155 07/23/21 0354  NA 130*  --   K 3.5  --   CL 88*  --   CO2 31  --   GLUCOSE 279*  --   BUN 9  --   CREATININE 0.65 0.66  CALCIUM 7.7*  --     GFR: Estimated Creatinine Clearance: 90 mL/min (by C-G formula based on  SCr of 0.66 mg/dL).  Liver Function Tests: No results for input(s): AST, ALT, ALKPHOS, BILITOT, PROT, ALBUMIN in the last 168 hours. No results for input(s): LIPASE, AMYLASE in the last 168 hours. No results for input(s): AMMONIA in the last 168 hours.  Coagulation Profile: Recent Labs  Lab 07/22/21 1515  INR 1.0    Cardiac Enzymes: No results for input(s): CKTOTAL, CKMB, CKMBINDEX, TROPONINI in the last 168 hours.  BNP (last 3 results) No results for input(s): PROBNP in the last 8760 hours.  Lipid Profile: No results for input(s): CHOL, HDL, LDLCALC, TRIG, CHOLHDL, LDLDIRECT in the last 72 hours.  Thyroid Function Tests: No results for input(s): TSH, T4TOTAL, FREET4, T3FREE, THYROIDAB in the last 72 hours.  Anemia Panel: No results for input(s): VITAMINB12, FOLATE, FERRITIN, TIBC, IRON, RETICCTPCT in the last 72 hours.  Urine analysis:    Component Value Date/Time   COLORURINE BROWN (A) 09/30/2016 1840   APPEARANCEUR TURBID (A) 09/30/2016 1840   LABSPEC >1.030 (H) 09/30/2016 1840   PHURINE 5.0 09/30/2016 1840   GLUCOSEU 250 (A) 09/30/2016 1840   HGBUR LARGE (A) 09/30/2016 1840   BILIRUBINUR Negative 05/22/2020 0839   KETONESUR 15 (A) 09/30/2016 1840   PROTEINUR Negative 05/22/2020 0839   PROTEINUR >300 (A) 09/30/2016 1840   UROBILINOGEN 0.2 05/22/2020 0839   NITRITE Negative 05/22/2020 0839   NITRITE NEGATIVE 09/30/2016 1840   LEUKOCYTESUR Negative 05/22/2020 0839    Sepsis  Labs: Lactic Acid, Venous    Component Value Date/Time   LATICACIDVEN 2.4 (HH) 07/22/2021 1515    MICROBIOLOGY: Recent Results (from the past 240 hour(s))  Aerobic Culture w Gram Stain (superficial specimen)     Status: None (Preliminary result)   Collection Time: 07/22/21 11:45 AM   Specimen: Abscess  Result Value Ref Range Status   Specimen Description   Final    ABSCESS Performed at [facility], [address], [place], [place] [address]    Special Requests   Final    RIGHT FOOT Performed at [facility], [address], [place], [place] [address]    Gram Stain PENDING  Incomplete   Culture   Final    CULTURE REINCUBATED FOR BETTER GROWTH Performed at [facility], [place] [address], [place],  [address]    Report Status PENDING  Incomplete  Resp Panel by RT-PCR (Flu A&B, Covid) Nasopharyngeal Swab     Status: Abnormal   Collection Time: 07/22/21 12:57 PM   Specimen: Nasopharyngeal Swab; Nasopharyngeal(NP) swabs in vial transport medium  Result Value Ref Range Status   SARS Coronavirus 2 by RT PCR POSITIVE (A) NEGATIVE Final    Comment: RESULT CALLED TO, READ BACK BY AND VERIFIED WITH: ADDISON GREGORY 07/22/21 @ 1403 BY S BEARD (NOTE) SARS-CoV-2 target nucleic acids are DETECTED.  The SARS-CoV-2 RNA is generally detectable in upper respiratory specimens during the acute phase of infection. Positive results are indicative of the presence of the identified virus, but do not rule out bacterial infection or co-infection with other pathogens not detected by the test. Clinical correlation with patient history and other diagnostic information is necessary to determine patient infection status. The expected result is Negative.  Fact Sheet for Patients: EntrepreneurPulse.com.au  Fact Sheet for Healthcare Providers: IncredibleEmployment.be  This test is not yet approved or cleared by the [place] FDA and   has been authorized for detection and/or diagnosis of SARS-CoV-2 by FDA under an Emergency Use Authorization (EUA).  This EUA will remain in effect (  meaning this test  can be used) for the duration of  the COVID-19 declaration under Section 564(b)(1) of the Act, 21 U.S.C. section 360bbb-3(b)(1), unless the authorization is terminated or revoked sooner.     Influenza A by PCR NEGATIVE NEGATIVE Final   Influenza B by PCR NEGATIVE NEGATIVE Final    Comment: (NOTE) The Xpert Xpress SARS-CoV-2/FLU/RSV plus assay is intended as an aid in the diagnosis of influenza from Nasopharyngeal swab specimens and should not be used as a sole basis for treatment. Nasal washings and aspirates are unacceptable for Xpert Xpress SARS-CoV-2/FLU/RSV testing.  Fact Sheet for Patients: EntrepreneurPulse.com.au  Fact Sheet for Healthcare Providers: IncredibleEmployment.be  This test is not yet approved or cleared by the [place] FDA and has been authorized for detection and/or diagnosis of SARS-CoV-2 by FDA under an Emergency Use Authorization (EUA). This EUA will remain in effect (meaning this test can be used) for the duration of the COVID-19 declaration under Section 564(b)(1) of the Act, 21 U.S.C. section 360bbb-3(b)(1), unless the authorization is terminated or revoked.  Performed at [facility], [address] [place], [place] [address]   Blood culture (routine x 2)     Status: None (Preliminary result)   Collection Time: 07/22/21 12:57 PM   Specimen: BLOOD  Result Value Ref Range Status   Specimen Description BLOOD RIGHT ANTECUBITAL  Final   Special Requests   Final    BOTTLES DRAWN AEROBIC AND ANAEROBIC Blood Culture adequate volume   Culture   Final    NO GROWTH < 24 HOURS Performed at [facility], [address], [place], [place] [address]    Report Status PENDING  Incomplete  Blood culture (routine x 2)     Status: None  (Preliminary result)   Collection Time: 07/22/21  1:16 PM   Specimen: BLOOD  Result Value Ref Range Status   Specimen Description BLOOD LEFT ANTECUBITAL  Final   Special Requests   Final    BOTTLES DRAWN AEROBIC AND ANAEROBIC Blood Culture results may not be optimal due to an inadequate volume of blood received in culture bottles   Culture   Final    NO GROWTH < 24 HOURS Performed at [facility], [address], [place], [place] [address]    Report Status PENDING  Incomplete    RADIOLOGY STUDIES/RESULTS: MR FOOT RIGHT W WO CONTRAST  Result Date: 07/22/2021 CLINICAL DATA:  Foot swelling, diabetic, osteomyelitis suspected, xray done EXAM: MRI OF THE RIGHT FOREFOOT WITHOUT AND WITH CONTRAST TECHNIQUE: Multiplanar, multisequence MR imaging of the right forefoot was performed before and after the administration of intravenous contrast. CONTRAST:  27m GADAVIST GADOBUTROL 1 MMOL/ML IV SOLN COMPARISON:  None. FINDINGS: Bones/Joint/Cartilage There is no evidence of acute fracture. There is edema signal and enhancement throughout the distal phalanx of the great toe, with preserved T1 marrow signal. Ligaments Intact Lisfranc ligament.  No evidence of plantar plate tear Muscles and Tendons Mild muscle atrophy throughout the foot. No evidence of acute tendon tear. Soft tissues There is a plantar soft tissue ulcer along the distal aspect of the great toe. There is distal great toe soft tissue swelling and skin thickening. There is a nonenhancing T2 hyperintense cystic lesion along the plantar lateral soft tissues of the midfoot compatible with a ganglion cyst measuring 1.2 x 0.7 cm (axial T2 image 47). There is extensive soft tissue swelling, predominantly superficially along the dorsal foot. IMPRESSION: Plantar soft tissue ulcer along the distal aspect of the great  toe. Bony edema and enhancement in the great toe distal phalanx with preserved T1 marrow signal. Given the adjacent ulcer, this probably  represents early osteomyelitis, though reactive marrow signal can also have this appearance. 1.2 cm ganglion cyst along the plantar soft tissues of the midfoot. Electronically Signed   By: Maurine Simmering   On: 07/22/2021 18:56   DG Chest Port 1 View  Result Date: 07/22/2021 CLINICAL DATA:  COVID toe infection EXAM: PORTABLE CHEST 1 VIEW COMPARISON:  12/22/2016, 04/01/2020 FINDINGS: No focal opacity or pleural effusion. Normal cardiac size. No pneumothorax. IMPRESSION: No active disease. Electronically Signed   By: Donavan Foil M.D.   On: 07/22/2021 16:41     LOS: 1 day   Oren Binet, MD  [facility]    To contact the attending provider between 7A-7P or the covering provider during after hours 7P-7A, please log into the web site [email or web address] and access using universal [place] password for that web site. If you do not have the password, please call the hospital operator.  07/23/2021, 4:40 PM

## 2021-07-23 NOTE — Progress Notes (Signed)
Subjective/Chief Complaint: Patient seen.  Doing okay.  Somewhat tired.   Objective: Vital signs in last 24 hours: Temp:  [98.5 F (36.9 C)-99 F (37.2 C)] 98.6 F (37 C) (08/03 1123) Pulse Rate:  [84-96] 84 (08/03 1123) Resp:  [16-17] 16 (08/03 1123) BP: (92-126)/(48-74) 93/50 (08/03 1123) SpO2:  [93 %-100 %] 100 % (08/03 1123) Last BM Date: 07/21/21  Intake/Output from previous day: 08/02 0701 - 08/03 0700 In: 1081.5 [I.V.:24.4; IV Piggyback:1057.1] Out: 0  Intake/Output this shift: No intake/output data recorded.  Bandage on the right foot left intact.  MRI results were reviewed which reveal evidence for osteomyelitis in the distal phalanx of the hallux.  White count trending downward.  Lab Results:  Recent Labs    07/22/21 1155 07/23/21 0354  WBC 15.2* 10.7*  HGB 12.6 10.7*  HCT 36.3 32.0*  PLT 436* 409*   BMET Recent Labs    07/22/21 1155 07/23/21 0354  NA 130*  --   K 3.5  --   CL 88*  --   CO2 31  --   GLUCOSE 279*  --   BUN 9  --   CREATININE 0.65 0.66  CALCIUM 7.7*  --    PT/INR Recent Labs    07/22/21 1515  LABPROT 13.4  INR 1.0   ABG No results for input(s): PHART, HCO3 in the last 72 hours.  Invalid input(s): PCO2, PO2  Studies/Results: MR FOOT RIGHT W WO CONTRAST  Result Date: 07/22/2021 CLINICAL DATA:  Foot swelling, diabetic, osteomyelitis suspected, xray done EXAM: MRI OF THE RIGHT FOREFOOT WITHOUT AND WITH CONTRAST TECHNIQUE: Multiplanar, multisequence MR imaging of the right forefoot was performed before and after the administration of intravenous contrast. CONTRAST:  98m GADAVIST GADOBUTROL 1 MMOL/ML IV SOLN COMPARISON:  None. FINDINGS: Bones/Joint/Cartilage There is no evidence of acute fracture. There is edema signal and enhancement throughout the distal phalanx of the great toe, with preserved T1 marrow signal. Ligaments Intact Lisfranc ligament.  No evidence of plantar plate tear Muscles and Tendons Mild muscle atrophy  throughout the foot. No evidence of acute tendon tear. Soft tissues There is a plantar soft tissue ulcer along the distal aspect of the great toe. There is distal great toe soft tissue swelling and skin thickening. There is a nonenhancing T2 hyperintense cystic lesion along the plantar lateral soft tissues of the midfoot compatible with a ganglion cyst measuring 1.2 x 0.7 cm (axial T2 image 47). There is extensive soft tissue swelling, predominantly superficially along the dorsal foot. IMPRESSION: Plantar soft tissue ulcer along the distal aspect of the great toe. Bony edema and enhancement in the great toe distal phalanx with preserved T1 marrow signal. Given the adjacent ulcer, this probably represents early osteomyelitis, though reactive marrow signal can also have this appearance. 1.2 cm ganglion cyst along the plantar soft tissues of the midfoot. Electronically Signed   By: JMaurine Simmering  On: 07/22/2021 18:56   DG Chest Port 1 View  Result Date: 07/22/2021 CLINICAL DATA:  COVID toe infection EXAM: PORTABLE CHEST 1 VIEW COMPARISON:  12/22/2016, 04/01/2020 FINDINGS: No focal opacity or pleural effusion. Normal cardiac size. No pneumothorax. IMPRESSION: No active disease. Electronically Signed   By: KDonavan FoilM.D.   On: 07/22/2021 16:41    Anti-infectives: Anti-infectives (From admission, onward)    Start     Dose/Rate Route Frequency Ordered Stop   07/23/21 1600  vancomycin (VANCOREADY) IVPB 1500 mg/300 mL        1,500 mg  150 mL/hr over 120 Minutes Intravenous Every 24 hours 07/22/21 1453     07/22/21 1500  cefTRIAXone (ROCEPHIN) 2 g in sodium chloride 0.9 % 100 mL IVPB        2 g 200 mL/hr over 30 Minutes Intravenous Every 24 hours 07/22/21 1403     07/22/21 1445  vancomycin (VANCOREADY) IVPB 1500 mg/300 mL       See Hyperspace for full Linked Orders Report.   1,500 mg 150 mL/hr over 120 Minutes Intravenous  Once 07/22/21 1341     07/22/21 1345  vancomycin (VANCOCIN) IVPB 1000 mg/200 mL  premix       See Hyperspace for full Linked Orders Report.   1,000 mg 200 mL/hr over 60 Minutes Intravenous  Once 07/22/21 1341     07/22/21 1315  vancomycin (VANCOCIN) 2,500 mg in sodium chloride 0.9 % 500 mL IVPB  Status:  Discontinued        2,500 mg 250 mL/hr over 120 Minutes Intravenous  Once 07/22/21 1302 07/22/21 1341   07/22/21 1300  vancomycin (VANCOREADY) IVPB 2000 mg/400 mL  Status:  Discontinued        2,000 mg 200 mL/hr over 120 Minutes Intravenous  Once 07/22/21 1251 07/22/21 1302   07/22/21 1300  piperacillin-tazobactam (ZOSYN) IVPB 4.5 g  Status:  Discontinued        4.5 g 200 mL/hr over 30 Minutes Intravenous  Once 07/22/21 1251 07/22/21 1315       Assessment/Plan: s/p Procedure(s): AMPUTATION TOE (Right) Assessment: Osteomyelitis right hallux  Plan: Again discussed with the patient amputation of her right great toe.  Operating room is extremely busy today going late into the evening.  Discussed with the patient waiting until tomorrow to perform her amputation.  Plan at this point would be for amputation of the right great toe after work.  Discussed with the patient possible risks and complications of the procedure including inability of the foot to heal due to her diabetes or extent of infection.  No guarantees can be given as to healing potential.  Consent form for amputation right great toe with possible partial first ray resection.  N.p.o. after early breakfast tomorrow.  LOS: 1 day    Durward Fortes 07/23/2021

## 2021-07-24 ENCOUNTER — Inpatient Hospital Stay: Payer: 59

## 2021-07-24 ENCOUNTER — Encounter
Admission: EM | Disposition: A | Discharge status: Home / Self Care | Payer: Self-pay | Source: Home / Self Care | Attending: Internal Medicine

## 2021-07-24 ENCOUNTER — Inpatient Hospital Stay: Payer: 59 | Admitting: Anesthesiology

## 2021-07-24 DIAGNOSIS — F418 Other specified anxiety disorders: Secondary | ICD-10-CM

## 2021-07-24 HISTORY — PX: AMPUTATION TOE: SHX6595

## 2021-07-24 LAB — GLUCOSE, CAPILLARY
Glucose-Capillary: 241 mg/dL — ABNORMAL HIGH (ref 70–99)
Glucose-Capillary: 234 mg/dL — ABNORMAL HIGH (ref 70–99)
Glucose-Capillary: 192 mg/dL — ABNORMAL HIGH (ref 70–99)
Glucose-Capillary: 175 mg/dL — ABNORMAL HIGH (ref 70–99)

## 2021-07-24 LAB — COMPREHENSIVE METABOLIC PANEL
ALT: 12 U/L (ref 0–44)
AST: 10 U/L — ABNORMAL LOW (ref 15–41)
Albumin: 2.7 g/dL — ABNORMAL LOW (ref 3.5–5.0)
Alkaline Phosphatase: 63 U/L (ref 38–126)
Anion gap: 8 (ref 5–15)
BUN: 8 mg/dL (ref 6–20)
CO2: 32 mmol/L (ref 22–32)
Calcium: 7.2 mg/dL — ABNORMAL LOW (ref 8.9–10.3)
Chloride: 96 mmol/L — ABNORMAL LOW (ref 98–111)
Creatinine, Ser: 0.68 mg/dL (ref 0.44–1.00)
GFR, Estimated: >60 mL/min (ref >60)
Glucose, Bld: 223 mg/dL — ABNORMAL HIGH (ref 70–99)
Potassium: 4.1 mmol/L (ref 3.5–5.1)
Sodium: 136 mmol/L (ref 135–145)
Total Bilirubin: 0.9 mg/dL (ref 0.3–1.2)
Total Protein: 6.8 g/dL (ref 6.5–8.1)

## 2021-07-24 LAB — CBC
HCT: 33.5 % — ABNORMAL LOW (ref 36.0–46.0)
Hemoglobin: 11 g/dL — ABNORMAL LOW (ref 12.0–15.0)
MCH: 28.7 pg (ref 26.0–34.0)
MCHC: 32.8 g/dL (ref 30.0–36.0)
MCV: 87.5 fL (ref 80.0–100.0)
Platelets: 426 10*3/uL — ABNORMAL HIGH (ref 150–400)
RBC: 3.83 MIL/uL — ABNORMAL LOW (ref 3.87–5.11)
RDW: 12.9 % (ref 11.5–15.5)
WBC: 9.8 10*3/uL (ref 4.0–10.5)
nRBC: 0 % (ref 0.0–0.2)

## 2021-07-24 LAB — C-REACTIVE PROTEIN: CRP: 9.1 mg/dL — ABNORMAL HIGH (ref <1.0)

## 2021-07-24 SURGERY — AMPUTATION, TOE
Anesthesia: Monitor Anesthesia Care | Site: Toe | Laterality: Right

## 2021-07-24 MED ORDER — PROPOFOL 10 MG/ML IV BOLUS
INTRAVENOUS | Status: DC | PRN
  Administered 2021-07-24: 40 mg via INTRAVENOUS
  Administered 2021-07-24: 60 mg via INTRAVENOUS

## 2021-07-24 MED ORDER — PROPOFOL 500 MG/50ML IV EMUL
INTRAVENOUS | Status: AC
  Filled 2021-07-24: qty 50

## 2021-07-24 MED ORDER — PROPOFOL 500 MG/50ML IV EMUL
INTRAVENOUS | Status: DC | PRN
  Administered 2021-07-24: 50 ug/kg/min via INTRAVENOUS

## 2021-07-24 MED ORDER — LIDOCAINE HCL (CARDIAC) PF 100 MG/5ML IV SOSY
PREFILLED_SYRINGE | INTRAVENOUS | Status: DC | PRN
  Administered 2021-07-24: 100 mg via INTRAVENOUS

## 2021-07-24 MED ORDER — BUPIVACAINE HCL 0.5 % IJ SOLN
INTRAMUSCULAR | Status: DC | PRN
  Administered 2021-07-24: 11 mL

## 2021-07-24 MED ORDER — SODIUM CHLORIDE 0.9 % IV SOLN: INTRAVENOUS | Status: DC | PRN

## 2021-07-24 MED ORDER — MIDAZOLAM HCL 2 MG/2ML IJ SOLN
INTRAMUSCULAR | Status: DC | PRN
  Administered 2021-07-24: 2 mg via INTRAVENOUS

## 2021-07-24 MED ORDER — NEOMYCIN-POLYMYXIN B GU 40-200000 IR SOLN
Status: DC | PRN
  Administered 2021-07-24: 2 mL

## 2021-07-24 MED ORDER — FENTANYL CITRATE (PF) 100 MCG/2ML IJ SOLN
INTRAMUSCULAR | Status: DC | PRN
  Administered 2021-07-24: 25 ug via INTRAVENOUS

## 2021-07-24 MED ORDER — MIDAZOLAM HCL 2 MG/2ML IJ SOLN
INTRAMUSCULAR | Status: AC
  Filled 2021-07-24: qty 2

## 2021-07-24 MED ORDER — FENTANYL CITRATE (PF) 100 MCG/2ML IJ SOLN
INTRAMUSCULAR | Status: AC
  Filled 2021-07-24: qty 2

## 2021-07-24 SURGICAL SUPPLY — 47 items
BLADE MED AGGRESSIVE (BLADE) ×2 IMPLANT
BLADE OSC/SAGITTAL MD 5.5X18 (BLADE) ×2 IMPLANT
BLADE SURG 15 STRL LF DISP TIS (BLADE) ×2 IMPLANT
BLADE SURG 15 STRL SS (BLADE) ×2
BLADE SURG MINI STRL (BLADE) ×2 IMPLANT
BNDG CONFORM 2 STRL LF (GAUZE/BANDAGES/DRESSINGS) ×2 IMPLANT
BNDG CONFORM 3 STRL LF (GAUZE/BANDAGES/DRESSINGS) ×2 IMPLANT
BNDG ELASTIC 4X5.8 VLCR STR LF (GAUZE/BANDAGES/DRESSINGS) ×2 IMPLANT
BNDG ESMARK 4X12 TAN STRL LF (GAUZE/BANDAGES/DRESSINGS) ×2 IMPLANT
BNDG GAUZE ELAST 4 BULKY (GAUZE/BANDAGES/DRESSINGS) ×2 IMPLANT
CANISTER SUCT 1200ML W/VALVE (MISCELLANEOUS) IMPLANT
CUFF TOURN SGL QUICK 12 (TOURNIQUET CUFF) IMPLANT
CUFF TOURN SGL QUICK 18X4 (TOURNIQUET CUFF) IMPLANT
DRAPE FLUOR MINI C-ARM 54X84 (DRAPES) IMPLANT
DURAPREP 26ML APPLICATOR (WOUND CARE) ×2 IMPLANT
ELECT REM PT RETURN 9FT ADLT (ELECTROSURGICAL) ×2
ELECTRODE REM PT RTRN 9FT ADLT (ELECTROSURGICAL) ×1 IMPLANT
GAUZE 4X4 16PLY ~~LOC~~+RFID DBL (SPONGE) ×2 IMPLANT
GAUZE SPONGE 4X4 12PLY STRL (GAUZE/BANDAGES/DRESSINGS) ×2 IMPLANT
GAUZE XEROFORM 1X8 LF (GAUZE/BANDAGES/DRESSINGS) ×2 IMPLANT
GLOVE SURG ENC MOIS LTX SZ7.5 (GLOVE) ×2 IMPLANT
GLOVE SURG UNDER LTX SZ8 (GLOVE) ×2 IMPLANT
GOWN STRL REUS W/ TWL LRG LVL3 (GOWN DISPOSABLE) ×2 IMPLANT
GOWN STRL REUS W/TWL LRG LVL3 (GOWN DISPOSABLE) ×2
HANDPIECE VERSAJET DEBRIDEMENT (MISCELLANEOUS) ×2 IMPLANT
IV NS IRRIG 3000ML ARTHROMATIC (IV SOLUTION) ×2 IMPLANT
KIT TURNOVER KIT A (KITS) ×2 IMPLANT
LABEL OR SOLS (LABEL) ×2 IMPLANT
MANIFOLD NEPTUNE II (INSTRUMENTS) ×2 IMPLANT
NEEDLE FILTER BLUNT 18X 1/2SAF (NEEDLE) ×1
NEEDLE FILTER BLUNT 18X1 1/2 (NEEDLE) ×1 IMPLANT
NEEDLE HYPO 25X1 1.5 SAFETY (NEEDLE) ×4 IMPLANT
NS IRRIG 500ML POUR BTL (IV SOLUTION) ×2 IMPLANT
PACK EXTREMITY ARMC (MISCELLANEOUS) ×2 IMPLANT
PAD ABD DERMACEA PRESS 5X9 (GAUZE/BANDAGES/DRESSINGS) ×4 IMPLANT
SOL PREP PVP 2OZ (MISCELLANEOUS) ×2
SOLUTION PREP PVP 2OZ (MISCELLANEOUS) ×1 IMPLANT
STOCKINETTE BIAS CUT 4 980044 (GAUZE/BANDAGES/DRESSINGS) ×2 IMPLANT
STOCKINETTE STRL 6IN 960660 (GAUZE/BANDAGES/DRESSINGS) ×2 IMPLANT
STRIP CLOSURE SKIN 1/4X4 (GAUZE/BANDAGES/DRESSINGS) ×2 IMPLANT
SUT ETHILON 3-0 FS-10 30 BLK (SUTURE) ×2
SUT ETHILON 4-0 (SUTURE)
SUT ETHILON 4-0 FS2 18XMFL BLK (SUTURE)
SUTURE EHLN 3-0 FS-10 30 BLK (SUTURE) ×1 IMPLANT
SUTURE ETHLN 4-0 FS2 18XMF BLK (SUTURE) IMPLANT
SWAB CULTURE AMIES ANAERIB BLU (MISCELLANEOUS) ×2 IMPLANT
SYR 10ML LL (SYRINGE) ×2 IMPLANT

## 2021-07-24 NOTE — Progress Notes (Signed)
Pt transferred from 133A  to SDS to room 10, pt covid positive, pt transferred to room 24.

## 2021-07-24 NOTE — Anesthesia Preprocedure Evaluation (Signed)
Anesthesia Evaluation  Patient identified by MRN, date of birth, ID band Patient awake    Reviewed: Allergy & Precautions, H&P , NPO status , Patient's Chart, lab work & pertinent test results, reviewed documented beta blocker date and time   Airway Mallampati: II  TM Distance: >3 FB Neck ROM: full    Dental no notable dental hx. (+) Teeth Intact   Pulmonary neg pulmonary ROS, neg shortness of breath, former smoker,  covid positive   Pulmonary exam normal breath sounds clear to auscultation       Cardiovascular Exercise Tolerance: Poor hypertension, On Medications + Peripheral Vascular Disease   Rhythm:regular Rate:Normal     Neuro/Psych  Headaches, PSYCHIATRIC DISORDERS Anxiety Depression asensate to level of foot  Neuromuscular disease    GI/Hepatic Neg liver ROS, GERD  Medicated,  Endo/Other  diabetes, Poorly Controlled, Type 2, Oral Hypoglycemic Agents, Insulin DependentMorbid obesity  Renal/GU      Musculoskeletal   Abdominal   Peds  Hematology negative hematology ROS (+)   Anesthesia Other Findings   Reproductive/Obstetrics negative OB ROS                             Anesthesia Physical Anesthesia Plan  ASA: 3 and emergent  Anesthesia Plan: MAC   Post-op Pain Management:    Induction:   PONV Risk Score and Plan:   Airway Management Planned:   Additional Equipment:   Intra-op Plan:   Post-operative Plan:   Informed Consent: I have reviewed the patients History and Physical, chart, labs and discussed the procedure including the risks, benefits and alternatives for the proposed anesthesia with the patient or authorized representative who has indicated his/her understanding and acceptance.       Plan Discussed with: CRNA  Anesthesia Plan Comments: (Effort to avoid airway manipulation for above with mac and digital nerve block if tolerated. Discussed and accepted by  pt. ja)        Anesthesia Quick Evaluation

## 2021-07-24 NOTE — Transfer of Care (Signed)
Immediate Anesthesia Transfer of Care Note  Patient: Gabriella Marsh  Procedure(s) Performed: AMPUTATION TOE (Right: Toe)  Patient Location: Nursing Unit  Anesthesia Type:General  Level of Consciousness: awake, alert  and oriented  Airway & Oxygen Therapy: Patient Spontanous Breathing  Post-op Assessment: Report given to RN and Post -op Vital signs reviewed and stable  Post vital signs: Reviewed and stable  Last Vitals:  Vitals Value Taken Time  BP 138/84   Temp    Pulse 82   Resp 12   SpO2 97     Last Pain:  Vitals:   07/24/21 1558  TempSrc: Oral  PainSc:          Complications: No notable events documented.

## 2021-07-24 NOTE — Op Note (Signed)
Date of operation: 07/24/2021.  Surgeon: Durward Fortes D.P.M.  Preoperative diagnosis: Osteomyelitis right hallux.  Postoperative diagnosis: Same.  Procedure: Amputation right hallux.  Anesthesia: Local MAC.  Hemostasis: Esmarch right ankle.  Estimated blood loss: Less than 5 cc.  Pathology: Right great toe.  Cultures: Bone culture distal phalanx right hallux.  Complications: None apparent.  Operative indications: This is a 55 year old female with recent history of ulceration on her right great toe that worsened over the last few weeks extending down to the level of bone with significant infection and decision was made for hospitalization and amputation.  Operative procedure: Patient was taken to the operating room and placed on the table in the supine position.  Following satisfactory sedation the right foot was anesthetized with 11 cc of 0.5% Marcaine plain around the first metatarsal.  The foot was prepped and draped in the usual sterile fashion.  Foot was exsanguinated with an Esmarch and Esmarch tourniquet was fabricated around the ankle.  Attention was directed to the right great toe where an incision was made from dorsal to plantar around the medial and lateral base of the great toe carried sharply down to the level of the bone.  Dissection carried back proximally and then the toe was transected using a sagittal saw and removed in toto.  The wound was flushed with copious amounts of sterile saline and closed using 3-0 nylon vertical mattress and simple interrupted sutures.  Xeroform 4 x 4's and 3 inch conformer applied to the right foot.  The Esmarch was released and an ABD with Kerlix and Ace wrap applied to the right lower extremity.  Patient was awakened and transported to PACU with vital signs stable and in good condition.

## 2021-07-24 NOTE — Interval H&P Note (Signed)
History and Physical Interval Note:  07/24/2021 7:21 PM  Gabriella Marsh  has presented today for surgery, with the diagnosis of Osteomyelitis right hallux  .  The various methods of treatment have been discussed with the patient and family. After consideration of risks, benefits and other options for treatment, the patient has consented to  Procedure(s): AMPUTATION TOE (Right) as a surgical intervention.  The patient's history has been reviewed, patient examined, no change in status, stable for surgery.  I have reviewed the patient's chart and labs.  Questions were answered to the patient's satisfaction.     Durward Fortes

## 2021-07-24 NOTE — Progress Notes (Signed)
PROGRESS NOTE        PATIENT DETAILS Name: Gabriella Marsh Age: 55 y.o. Sex: female Date of Birth: 08-02-1966 Admit Date: 07/22/2021 Admitting Physician Ivor Costa, MD [id number] [facility]  Brief Narrative: Patient is a 55 y.o. female with history of DM, peripheral neuropathy, HTN, HLD, GERD, hypothyroidism-who presented with diabetic foot ulcer involving right great toe-further evaluation with MRI showed underlying osteomyelitis.  Significant events: 8/2>> admit for diabetic right toe infection with underlying osteomyelitis.  Significant studies: 8/2>> CXR: No active disease 8/2>> MRI right foot: Bony edema/enhancement of great toe distal phalanx-likely early osteomyelitis  Antimicrobial therapy: Rocephin: 8/2>> Vancomycin: 8/3>>  Microbiology data: 8/2>> blood culture: Negative 8/2>> wound swab: Staff aureus  Procedures : None  Consults: Podiatry  DVT Prophylaxis : heparin injection 5,000 Units Start: 07/22/21 1400   Subjective: Lying comfortably in bed-eating breakfast-no major issues overnight.  For toe amputation later today.   Assessment/Plan: Sepsis due to right diabetic toe infection with underlying osteomyelitis: This physiology has resolved-on IV antibiotics-awaiting toe amputation.  Await further input from podiatry.    COVID-19 infection: Asymptomatic-watch closely for now.  HTN: BP stable-lisinopril/diuretics remain on hold-resume when able.  DM-2 (A1c 9.4 on 3/16): CBGs relatively stable on SSI-4 OR later today-reassess on 8/5.   Recent Labs    07/23/21 2201 07/24/21 0752 07/24/21 1233  GLUCAP 189* 241* 234*    GERD: Continue PPI  Hypothyroidism: Continue Synthroid  HLD: On statin  Peripheral neuropathy: Continue Neurontin  Depression with anxiety: Appears stable-continue nortriptyline  Morbid Obesity: Estimated body mass index is 41.15 kg/m as calculated from the following:   Height as of  this encounter: '5\' 2"'$  (1.575 m).   Weight as of this encounter: 102.1 kg.    Diet: Diet Order             Diet NPO time specified  Diet effective now                    Code Status: Full code   Family Communication: None nat bedside  Disposition Plan: Status is: Inpatient  Remains inpatient appropriate because:Inpatient level of care appropriate due to severity of illness  Dispo: The patient is from: Home              Anticipated d/c is to: Home              Patient currently is not medically stable to d/c.   Difficult to place patient No   Barriers to Discharge: Right toe diabetic foot with underlying osteomyelitis-scheduled for toe amputation on 8/4-currently on IV antimicrobial therapy  Antimicrobial agents: Anti-infectives (From admission, onward)    Start     Dose/Rate Route Frequency Ordered Stop   07/23/21 1600  vancomycin (VANCOREADY) IVPB 1500 mg/300 mL        1,500 mg 150 mL/hr over 120 Minutes Intravenous Every 24 hours 07/22/21 1453     07/22/21 1500  cefTRIAXone (ROCEPHIN) 2 g in sodium chloride 0.9 % 100 mL IVPB        2 g 200 mL/hr over 30 Minutes Intravenous Every 24 hours 07/22/21 1403     07/22/21 1445  vancomycin (VANCOREADY) IVPB 1500 mg/300 mL       See Hyperspace for full Linked Orders Report.   1,500 mg 150 mL/hr over 120 Minutes Intravenous  Once  07/22/21 1341 07/24/21 0707   07/22/21 1345  vancomycin (VANCOCIN) IVPB 1000 mg/200 mL premix       See Hyperspace for full Linked Orders Report.   1,000 mg 200 mL/hr over 60 Minutes Intravenous  Once 07/22/21 1341     07/22/21 1315  vancomycin (VANCOCIN) 2,500 mg in sodium chloride 0.9 % 500 mL IVPB  Status:  Discontinued        2,500 mg 250 mL/hr over 120 Minutes Intravenous  Once 07/22/21 1302 07/22/21 1341   07/22/21 1300  vancomycin (VANCOREADY) IVPB 2000 mg/400 mL  Status:  Discontinued        2,000 mg 200 mL/hr over 120 Minutes Intravenous  Once 07/22/21 1251 07/22/21 1302   07/22/21  1300  piperacillin-tazobactam (ZOSYN) IVPB 4.5 g  Status:  Discontinued        4.5 g 200 mL/hr over 30 Minutes Intravenous  Once 07/22/21 1251 07/22/21 1315        Time spent: 25 minutes-Greater than 50% of this time was spent in counseling, explanation of diagnosis, planning of further management, and coordination of care.  MEDICATIONS: Scheduled Meds:  atorvastatin  20 mg Oral QHS   calcium carbonate  2 tablet Oral TID WC   dextromethorphan-guaiFENesin  1 tablet Oral BID   gabapentin  800 mg Oral TID   heparin  5,000 Units Subcutaneous Q8H   insulin aspart  0-5 Units Subcutaneous QHS   insulin aspart  0-9 Units Subcutaneous TID WC   levothyroxine  150 mcg Oral Q0600   melatonin  10 mg Oral QHS   nortriptyline  50 mg Oral QHS   pantoprazole  40 mg Oral Daily   Continuous Infusions:  sodium chloride 75 mL/hr at 07/22/21 1523   cefTRIAXone (ROCEPHIN)  IV 2 g (07/23/21 1654)   vancomycin     vancomycin     PRN Meds:.acetaminophen (TYLENOL) oral liquid 160 mg/5 mL, albuterol, cyclobenzaprine, hydrALAZINE, ondansetron (ZOFRAN) IV, oxyCODONE-acetaminophen, traZODone   PHYSICAL EXAM: Vital signs: Vitals:   07/23/21 1500 07/23/21 2205 07/24/21 0751 07/24/21 1232  BP: (!) 100/59 (!) 117/59 (!) 99/57 118/79  Pulse: 84 93 88 88  Resp: '15 16 17 17  '$ Temp: 98.3 F (36.8 C) 98.8 F (37.1 C) 98.9 F (37.2 C) 98.4 F (36.9 C)  TempSrc:  Oral    SpO2: 98% 96% 97% 99%  Weight:      Height:       Filed Weights   07/22/21 1152  Weight: 102.1 kg   Body mass index is 41.15 kg/m.   Gen Exam:Alert awake-not in any distress HEENT:atraumatic, normocephalic Chest: B/L clear to auscultation anteriorly CVS:S1S2 regular Abdomen:soft non tender, non distended Extremities:no edema Neurology: Non focal Skin: no rash   I have personally reviewed following labs and imaging studies  LABORATORY DATA: CBC: Recent Labs  Lab 07/22/21 1155 07/23/21 0354 07/24/21 0347  WBC 15.2*  10.7* 9.8  HGB 12.6 10.7* 11.0*  HCT 36.3 32.0* 33.5*  MCV 87.7 88.2 87.5  PLT 436* 409* 426*     Basic Metabolic Panel: Recent Labs  Lab 07/22/21 1155 07/23/21 0354 07/24/21 0347  NA 130*  --  136  K 3.5  --  4.1  CL 88*  --  96*  CO2 31  --  32  GLUCOSE 279*  --  223*  BUN 9  --  8  CREATININE 0.65 0.66 0.68  CALCIUM 7.7*  --  7.2*     GFR: Estimated Creatinine Clearance: 90 mL/min (by  C-G formula based on SCr of 0.68 mg/dL).  Liver Function Tests: Recent Labs  Lab 07/24/21 0347  AST 10*  ALT 12  ALKPHOS 63  BILITOT 0.9  PROT 6.8  ALBUMIN 2.7*   No results for input(s): LIPASE, AMYLASE in the last 168 hours. No results for input(s): AMMONIA in the last 168 hours.  Coagulation Profile: Recent Labs  Lab 07/22/21 1515  INR 1.0     Cardiac Enzymes: No results for input(s): CKTOTAL, CKMB, CKMBINDEX, TROPONINI in the last 168 hours.  BNP (last 3 results) No results for input(s): PROBNP in the last 8760 hours.  Lipid Profile: No results for input(s): CHOL, HDL, LDLCALC, TRIG, CHOLHDL, LDLDIRECT in the last 72 hours.  Thyroid Function Tests: No results for input(s): TSH, T4TOTAL, FREET4, T3FREE, THYROIDAB in the last 72 hours.  Anemia Panel: No results for input(s): VITAMINB12, FOLATE, FERRITIN, TIBC, IRON, RETICCTPCT in the last 72 hours.  Urine analysis:    Component Value Date/Time   COLORURINE BROWN (A) 09/30/2016 1840   APPEARANCEUR TURBID (A) 09/30/2016 1840   LABSPEC >1.030 (H) 09/30/2016 1840   PHURINE 5.0 09/30/2016 1840   GLUCOSEU 250 (A) 09/30/2016 1840   HGBUR LARGE (A) 09/30/2016 1840   BILIRUBINUR Negative 05/22/2020 0839   KETONESUR 15 (A) 09/30/2016 1840   PROTEINUR Negative 05/22/2020 0839   PROTEINUR >300 (A) 09/30/2016 1840   UROBILINOGEN 0.2 05/22/2020 0839   NITRITE Negative 05/22/2020 0839   NITRITE NEGATIVE 09/30/2016 1840   LEUKOCYTESUR Negative 05/22/2020 0839    Sepsis Labs: Lactic Acid, Venous    Component  Value Date/Time   LATICACIDVEN 2.4 (HH) 07/22/2021 1515    MICROBIOLOGY: Recent Results (from the past 240 hour(s))  Aerobic Culture w Gram Stain (superficial specimen)     Status: None (Preliminary result)   Collection Time: 07/22/21 11:45 AM   Specimen: Abscess  Result Value Ref Range Status   Specimen Description   Final    ABSCESS Performed at [facility], [address], [place], [place] [address]    Special Requests   Final    RIGHT FOOT Performed at [facility], [address] [place], [place] [address]    Gram Stain   Final    FEW WBC PRESENT,BOTH PMN AND MONONUCLEAR FEW GRAM POSITIVE COCCI IN PAIRS FEW GRAM NEGATIVE RODS    Culture   Final    ABUNDANT STAPHYLOCOCCUS AUREUS SUSCEPTIBILITIES TO FOLLOW CULTURE REINCUBATED FOR BETTER GROWTH Performed at [facility], [place] [address] [place], [place] [address]    Report Status PENDING  Incomplete  Resp Panel by RT-PCR (Flu A&B, Covid) Nasopharyngeal Swab     Status: Abnormal   Collection Time: 07/22/21 12:57 PM   Specimen: Nasopharyngeal Swab; Nasopharyngeal(NP) swabs in vial transport medium  Result Value Ref Range Status   SARS Coronavirus 2 by RT PCR POSITIVE (A) NEGATIVE Final    Comment: RESULT CALLED TO, READ BACK BY AND VERIFIED WITH: ADDISON GREGORY 07/22/21 @ 1403 BY S BEARD (NOTE) SARS-CoV-2 target nucleic acids are DETECTED.  The SARS-CoV-2 RNA is generally detectable in upper respiratory specimens during the acute phase of infection. Positive results are indicative of the presence of the identified virus, but do not rule out bacterial infection or co-infection with other pathogens not detected by the test. Clinical correlation with patient history and other diagnostic information is necessary to determine patient infection status. The expected result is Negative.  Fact Sheet for Patients: EntrepreneurPulse.com.au  Fact Sheet for Healthcare  Providers: IncredibleEmployment.be  This test is not yet approved or cleared by the [place] and  has been authorized for detection and/or diagnosis of SARS-CoV-2 by FDA under an Emergency Use Authorization (EUA).  This EUA will remain in effect (meaning this test  can be used) for the duration of  the COVID-19 declaration under Section 564(b)(1) of the Act, 21 U.S.C. section 360bbb-3(b)(1), unless the authorization is terminated or revoked sooner.     Influenza A by PCR NEGATIVE NEGATIVE Final   Influenza B by PCR NEGATIVE NEGATIVE Final    Comment: (NOTE) The Xpert Xpress SARS-CoV-2/FLU/RSV plus assay is intended as an aid in the diagnosis of influenza from Nasopharyngeal swab specimens and should not be used as a sole basis for treatment. Nasal washings and aspirates are unacceptable for Xpert Xpress SARS-CoV-2/FLU/RSV testing.  Fact Sheet for Patients: EntrepreneurPulse.com.au  Fact Sheet for Healthcare Providers: IncredibleEmployment.be  This test is not yet approved or cleared by the [place] FDA and has been authorized for detection and/or diagnosis of SARS-CoV-2 by FDA under an Emergency Use Authorization (EUA). This EUA will remain in effect (meaning this test can be used) for the duration of the COVID-19 declaration under Section 564(b)(1) of the Act, 21 U.S.C. section 360bbb-3(b)(1), unless the authorization is terminated or revoked.  Performed at [facility], [address] [place], [place] [address]   Blood culture (routine x 2)     Status: None (Preliminary result)   Collection Time: 07/22/21 12:57 PM   Specimen: BLOOD  Result Value Ref Range Status   Specimen Description BLOOD RIGHT ANTECUBITAL  Final   Special Requests   Final    BOTTLES DRAWN AEROBIC AND ANAEROBIC Blood Culture adequate volume   Culture   Final    NO GROWTH 2 DAYS Performed at [facility], [address] [place], [place] [address]    Report Status PENDING  Incomplete  Blood culture (routine x 2)     Status: None (Preliminary result)   Collection Time: 07/22/21  1:16 PM   Specimen: BLOOD  Result Value Ref Range Status   Specimen Description BLOOD LEFT ANTECUBITAL  Final   Special Requests   Final    BOTTLES DRAWN AEROBIC AND ANAEROBIC Blood Culture results may not be optimal due to an inadequate volume of blood received in culture bottles   Culture   Final    NO GROWTH 2 DAYS Performed at [facility], [address], [place], [place] [address]    Report Status PENDING  Incomplete    RADIOLOGY STUDIES/RESULTS: MR FOOT RIGHT W WO CONTRAST  Result Date: 07/22/2021 CLINICAL DATA:  Foot swelling, diabetic, osteomyelitis suspected, xray done EXAM: MRI OF THE RIGHT FOREFOOT WITHOUT AND WITH CONTRAST TECHNIQUE: Multiplanar, multisequence MR imaging of the right forefoot was performed before and after the administration of intravenous contrast. CONTRAST:  73m GADAVIST GADOBUTROL 1 MMOL/ML IV SOLN COMPARISON:  None. FINDINGS: Bones/Joint/Cartilage There is no evidence of acute fracture. There is edema signal and enhancement throughout the distal phalanx of the great toe, with preserved T1 marrow signal. Ligaments Intact Lisfranc ligament.  No evidence of plantar plate tear Muscles and Tendons Mild muscle atrophy throughout the foot. No evidence of acute tendon tear. Soft tissues There is a plantar soft tissue ulcer along the distal aspect of the great toe. There is distal great toe soft tissue swelling and skin thickening. There is a nonenhancing T2 hyperintense cystic lesion along the plantar lateral soft tissues of  the midfoot compatible with a ganglion cyst measuring 1.2 x 0.7 cm (axial T2 image 47). There is extensive soft tissue swelling, predominantly superficially along the dorsal foot. IMPRESSION: Plantar soft tissue ulcer along the distal aspect of the great toe. Bony edema  and enhancement in the great toe distal phalanx with preserved T1 marrow signal. Given the adjacent ulcer, this probably represents early osteomyelitis, though reactive marrow signal can also have this appearance. 1.2 cm ganglion cyst along the plantar soft tissues of the midfoot. Electronically Signed   By: Maurine Simmering   On: 07/22/2021 18:56   DG Chest Port 1 View  Result Date: 07/22/2021 CLINICAL DATA:  COVID toe infection EXAM: PORTABLE CHEST 1 VIEW COMPARISON:  12/22/2016, 04/01/2020 FINDINGS: No focal opacity or pleural effusion. Normal cardiac size. No pneumothorax. IMPRESSION: No active disease. Electronically Signed   By: Donavan Foil M.D.   On: 07/22/2021 16:41     LOS: 2 days   Oren Binet, MD  [facility]    To contact the attending provider between 7A-7P or the covering provider during after hours 7P-7A, please log into the web site [email or web address] and access using universal [place] password for that web site. If you do not have the password, please call the hospital operator.  07/24/2021, 2:34 PM

## 2021-07-24 NOTE — Anesthesia Postprocedure Evaluation (Signed)
Anesthesia Post Note  Patient: Gabriella Marsh  Procedure(s) Performed: AMPUTATION TOE (Right: Toe)  Patient location during evaluation: PACU Anesthesia Type: MAC Level of consciousness: awake and alert Pain management: pain level controlled Vital Signs Assessment: post-procedure vital signs reviewed and stable Respiratory status: spontaneous breathing, nonlabored ventilation, respiratory function stable and patient connected to nasal cannula oxygen Cardiovascular status: blood pressure returned to baseline and stable Postop Assessment: no apparent nausea or vomiting Anesthetic complications: no   No notable events documented.   Last Vitals:  Vitals:   07/24/21 1558 07/24/21 2101  BP: 122/72 (!) 141/76  Pulse: 88 92  Resp: 17 20  Temp: 36.8 C 36.9 C  SpO2: 94% 97%    Last Pain:  Vitals:   07/24/21 2300  TempSrc:   PainSc: [place]

## 2021-07-25 ENCOUNTER — Encounter: Payer: Self-pay | Admitting: Podiatry

## 2021-07-25 LAB — AEROBIC CULTURE W GRAM STAIN (SUPERFICIAL SPECIMEN)

## 2021-07-25 LAB — CREATININE, SERUM
Creatinine, Ser: 0.52 mg/dL (ref 0.44–1.00)
GFR, Estimated: >60 mL/min (ref >60)

## 2021-07-25 LAB — GLUCOSE, CAPILLARY
Glucose-Capillary: 221 mg/dL — ABNORMAL HIGH (ref 70–99)
Glucose-Capillary: 220 mg/dL — ABNORMAL HIGH (ref 70–99)
Glucose-Capillary: 232 mg/dL — ABNORMAL HIGH (ref 70–99)
Glucose-Capillary: 221 mg/dL — ABNORMAL HIGH (ref 70–99)

## 2021-07-25 LAB — C-REACTIVE PROTEIN: CRP: 9 mg/dL — ABNORMAL HIGH (ref <1.0)

## 2021-07-25 MED ORDER — SODIUM CHLORIDE 0.9 % IV SOLN
3.0000 g | Freq: Four times a day (QID) | INTRAVENOUS | Status: DC
  Administered 2021-07-25 – 2021-07-26 (×4): 3 g via INTRAVENOUS
  Filled 2021-07-25: qty 8
  Filled 2021-07-25: qty 3
  Filled 2021-07-25 (×4): qty 8

## 2021-07-25 NOTE — Progress Notes (Addendum)
Patient ID: Gabriella Marsh, female   DOB: 09-21-1966, 55 y.o.   MRN: [id number] Subjective: Briefly stopped in to see the patient.  Does not really relate any significant pain with the amputation site.  Objective: The bandages dry and intact.  No evidence of any strikethrough.  Assessment: Status post amputation right hallux.  Plan: Dressing left intact.  Plan for dressing change tomorrow and discharge if everything is stable.

## 2021-07-25 NOTE — Progress Notes (Signed)
PROGRESS NOTE        PATIENT DETAILS Name: Gabriella Marsh Age: 55 y.o. Sex: female Date of Birth: 01-11-1966 Admit Date: 07/22/2021 Admitting Physician Ivor Costa, MD [id number] [facility]  Brief Narrative: Patient is a 55 y.o. female with history of DM, peripheral neuropathy, HTN, HLD, GERD, hypothyroidism-who presented with diabetic foot ulcer involving right great toe-further evaluation with MRI showed underlying osteomyelitis.  Significant events: 8/2>> admit for diabetic right toe infection with underlying osteomyelitis.  Significant studies: 8/2>> CXR: No active disease 8/2>> MRI right foot: Bony edema/enhancement of great toe distal phalanx-likely early osteomyelitis  Antimicrobial therapy: Rocephin: 8/2>> Vancomycin: 8/3>>  Microbiology data: 8/2>> blood culture: Negative 8/2>> superficial wound swab: Staff aureus 8/4>> surgical/deep wound culture: Pending  Procedures : 8/4>> Amputation right hallux.  Consults: Podiatry  DVT Prophylaxis : heparin injection 5,000 Units Start: 07/22/21 1400   Subjective: Lying comfortably in bed-no major issues overnight.   Assessment/Plan: Sepsis due to right diabetic toe infection with underlying osteomyelitis: Sepsis physiology has resolved-on IV antimicrobial therapy-underwent right toe amputation on 8/4.  Discussed with podiatry MD-recommendations are to continue inpatient IV antibiotics-and reassess on 8/6 for discharge.  Intraoperative deep tissue cultures pending.  COVID-19 infection: Asymptomatic-watch closely for now.  HTN: BP stable-lisinopril/diuretics remain on hold-resume when able.  DM-2 (A1c 9.4 on 3/16): CBG stable on SSI.  Recent Labs    07/24/21 2105 07/25/21 0826 07/25/21 1218  GLUCAP 175* 221* 220*    GERD: Continue PPI  Hypothyroidism: Continue Synthroid  HLD: On statin  Peripheral neuropathy: Continue Neurontin  Depression with anxiety: Appears  stable-continue nortriptyline  Morbid Obesity: Estimated body mass index is 41.15 kg/m as calculated from the following:   Height as of this encounter: '5\' 2"'$  (1.575 m).   Weight as of this encounter: 102.1 kg.    Diet: Diet Order             Diet Carb Modified Fluid consistency: Thin; Room service appropriate? Yes  Diet effective now                    Code Status: Full code   Family Communication: None at bedside  Disposition Plan: Status is: Inpatient  Remains inpatient appropriate because:Inpatient level of care appropriate due to severity of illness  Dispo: The patient is from: Home              Anticipated d/c is to: Home              Patient currently is not medically stable to d/c.   Difficult to place patient No   Barriers to Discharge: Right diabetic foot with osteomyelitis-awaiting intraoperative cultures-remains on IV antimicrobial therapy.  Antimicrobial agents: Anti-infectives (From admission, onward)    Start     Dose/Rate Route Frequency Ordered Stop   07/23/21 1600  vancomycin (VANCOREADY) IVPB 1500 mg/300 mL        1,500 mg 150 mL/hr over 120 Minutes Intravenous Every 24 hours 07/22/21 1453     07/22/21 1500  cefTRIAXone (ROCEPHIN) 2 g in sodium chloride 0.9 % 100 mL IVPB        2 g 200 mL/hr over 30 Minutes Intravenous Every 24 hours 07/22/21 1403     07/22/21 1445  vancomycin (VANCOREADY) IVPB 1500 mg/300 mL       See Hyperspace for full Linked  Orders Report.   1,500 mg 150 mL/hr over 120 Minutes Intravenous  Once 07/22/21 1341 07/24/21 0707   07/22/21 1345  vancomycin (VANCOCIN) IVPB 1000 mg/200 mL premix       See Hyperspace for full Linked Orders Report.   1,000 mg 200 mL/hr over 60 Minutes Intravenous  Once 07/22/21 1341     07/22/21 1315  vancomycin (VANCOCIN) 2,500 mg in sodium chloride 0.9 % 500 mL IVPB  Status:  Discontinued        2,500 mg 250 mL/hr over 120 Minutes Intravenous  Once 07/22/21 1302 07/22/21 1341   07/22/21 1300   vancomycin (VANCOREADY) IVPB 2000 mg/400 mL  Status:  Discontinued        2,000 mg 200 mL/hr over 120 Minutes Intravenous  Once 07/22/21 1251 07/22/21 1302   07/22/21 1300  piperacillin-tazobactam (ZOSYN) IVPB 4.5 g  Status:  Discontinued        4.5 g 200 mL/hr over 30 Minutes Intravenous  Once 07/22/21 1251 07/22/21 1315        Time spent: 25 minutes-Greater than 50% of this time was spent in counseling, explanation of diagnosis, planning of further management, and coordination of care.  MEDICATIONS: Scheduled Meds:  atorvastatin  20 mg Oral QHS   calcium carbonate  2 tablet Oral TID WC   dextromethorphan-guaiFENesin  1 tablet Oral BID   gabapentin  800 mg Oral TID   heparin  5,000 Units Subcutaneous Q8H   insulin aspart  0-5 Units Subcutaneous QHS   insulin aspart  0-9 Units Subcutaneous TID WC   levothyroxine  150 mcg Oral Q0600   melatonin  10 mg Oral QHS   nortriptyline  50 mg Oral QHS   pantoprazole  40 mg Oral Daily   Continuous Infusions:  sodium chloride 10 mL/hr at 07/24/21 1512   cefTRIAXone (ROCEPHIN)  IV Stopped (07/24/21 1629)   vancomycin     vancomycin 1,500 mg (07/24/21 1650)   PRN Meds:.acetaminophen (TYLENOL) oral liquid 160 mg/5 mL, albuterol, cyclobenzaprine, hydrALAZINE, ondansetron (ZOFRAN) IV, oxyCODONE-acetaminophen, traZODone   PHYSICAL EXAM: Vital signs: Vitals:   07/24/21 2101 07/25/21 0551 07/25/21 0824 07/25/21 1216  BP: (!) 141/76 135/88 (!) 148/87 137/85  Pulse: 92 91 87 89  Resp: '20 18 16 16  '$ Temp: 98.4 F (36.9 C) 98.6 F (37 C) 98.9 F (37.2 C) 98.5 F (36.9 C)  TempSrc: Oral Oral    SpO2: 97% 97% 96% 97%  Weight:      Height:       Filed Weights   07/22/21 1152  Weight: 102.1 kg   Body mass index is 41.15 kg/m.   Gen Exam:Alert awake-not in any distress HEENT:atraumatic, normocephalic Chest: B/L clear to auscultation anteriorly CVS:S1S2 regular Abdomen:soft non tender, non distended Extremities:no edema Neurology:  Non focal Skin: no rash   I have personally reviewed following labs and imaging studies  LABORATORY DATA: CBC: Recent Labs  Lab 07/22/21 1155 07/23/21 0354 07/24/21 0347  WBC 15.2* 10.7* 9.8  HGB 12.6 10.7* 11.0*  HCT 36.3 32.0* 33.5*  MCV 87.7 88.2 87.5  PLT 436* 409* 426*     Basic Metabolic Panel: Recent Labs  Lab 07/22/21 1155 07/23/21 0354 07/24/21 0347 07/25/21 0626  NA 130*  --  136  --   K 3.5  --  4.1  --   CL 88*  --  96*  --   CO2 31  --  32  --   GLUCOSE 279*  --  223*  --  BUN 9  --  8  --   CREATININE 0.65 0.66 0.68 0.52  CALCIUM 7.7*  --  7.2*  --      GFR: Estimated Creatinine Clearance: 90 mL/min (by C-G formula based on SCr of 0.52 mg/dL).  Liver Function Tests: Recent Labs  Lab 07/24/21 0347  AST 10*  ALT 12  ALKPHOS 63  BILITOT 0.9  PROT 6.8  ALBUMIN 2.7*    No results for input(s): LIPASE, AMYLASE in the last 168 hours. No results for input(s): AMMONIA in the last 168 hours.  Coagulation Profile: Recent Labs  Lab 07/22/21 1515  INR 1.0     Cardiac Enzymes: No results for input(s): CKTOTAL, CKMB, CKMBINDEX, TROPONINI in the last 168 hours.  BNP (last 3 results) No results for input(s): PROBNP in the last 8760 hours.  Lipid Profile: No results for input(s): CHOL, HDL, LDLCALC, TRIG, CHOLHDL, LDLDIRECT in the last 72 hours.  Thyroid Function Tests: No results for input(s): TSH, T4TOTAL, FREET4, T3FREE, THYROIDAB in the last 72 hours.  Anemia Panel: No results for input(s): VITAMINB12, FOLATE, FERRITIN, TIBC, IRON, RETICCTPCT in the last 72 hours.  Urine analysis:    Component Value Date/Time   COLORURINE BROWN (A) 09/30/2016 1840   APPEARANCEUR TURBID (A) 09/30/2016 1840   LABSPEC >1.030 (H) 09/30/2016 1840   PHURINE 5.0 09/30/2016 1840   GLUCOSEU 250 (A) 09/30/2016 1840   HGBUR LARGE (A) 09/30/2016 1840   BILIRUBINUR Negative 05/22/2020 0839   KETONESUR 15 (A) 09/30/2016 1840   PROTEINUR Negative 05/22/2020  0839   PROTEINUR >300 (A) 09/30/2016 1840   UROBILINOGEN 0.2 05/22/2020 0839   NITRITE Negative 05/22/2020 0839   NITRITE NEGATIVE 09/30/2016 1840   LEUKOCYTESUR Negative 05/22/2020 0839    Sepsis Labs: Lactic Acid, Venous    Component Value Date/Time   LATICACIDVEN 2.4 (HH) 07/22/2021 1515    MICROBIOLOGY: Recent Results (from the past 240 hour(s))  Aerobic Culture w Gram Stain (superficial specimen)     Status: None (Preliminary result)   Collection Time: 07/22/21 11:45 AM   Specimen: Abscess  Result Value Ref Range Status   Specimen Description   Final    ABSCESS Performed at [facility], [address], [place], [place] [address]    Special Requests   Final    RIGHT FOOT Performed at [facility], [address] [place], [place] [address]    Gram Stain   Final    FEW WBC PRESENT,BOTH PMN AND MONONUCLEAR FEW GRAM POSITIVE COCCI IN PAIRS FEW GRAM NEGATIVE RODS    Culture   Final    ABUNDANT STAPHYLOCOCCUS AUREUS SUSCEPTIBILITIES TO FOLLOW CULTURE REINCUBATED FOR BETTER GROWTH Performed at [facility], [place] [address] [place], [place] [address]    Report Status PENDING  Incomplete  Resp Panel by RT-PCR (Flu A&B, Covid) Nasopharyngeal Swab     Status: Abnormal   Collection Time: 07/22/21 12:57 PM   Specimen: Nasopharyngeal Swab; Nasopharyngeal(NP) swabs in vial transport medium  Result Value Ref Range Status   SARS Coronavirus 2 by RT PCR POSITIVE (A) NEGATIVE Final    Comment: RESULT CALLED TO, READ BACK BY AND VERIFIED WITH: ADDISON GREGORY 07/22/21 @ 1403 BY S BEARD (NOTE) SARS-CoV-2 target nucleic acids are DETECTED.  The SARS-CoV-2 RNA is generally detectable in upper respiratory specimens during the acute phase of infection. Positive results are indicative of the presence of the identified virus, but do not rule out bacterial infection or co-infection with other pathogens not  detected by the test. Clinical correlation with  patient history and other diagnostic information is necessary to determine patient infection status. The expected result is Negative.  Fact Sheet for Patients: EntrepreneurPulse.com.au  Fact Sheet for Healthcare Providers: IncredibleEmployment.be  This test is not yet approved or cleared by the [place] FDA and  has been authorized for detection and/or diagnosis of SARS-CoV-2 by FDA under an Emergency Use Authorization (EUA).  This EUA will remain in effect (meaning this test  can be used) for the duration of  the COVID-19 declaration under Section 564(b)(1) of the Act, 21 U.S.C. section 360bbb-3(b)(1), unless the authorization is terminated or revoked sooner.     Influenza A by PCR NEGATIVE NEGATIVE Final   Influenza B by PCR NEGATIVE NEGATIVE Final    Comment: (NOTE) The Xpert Xpress SARS-CoV-2/FLU/RSV plus assay is intended as an aid in the diagnosis of influenza from Nasopharyngeal swab specimens and should not be used as a sole basis for treatment. Nasal washings and aspirates are unacceptable for Xpert Xpress SARS-CoV-2/FLU/RSV testing.  Fact Sheet for Patients: EntrepreneurPulse.com.au  Fact Sheet for Healthcare Providers: IncredibleEmployment.be  This test is not yet approved or cleared by the [place] FDA and has been authorized for detection and/or diagnosis of SARS-CoV-2 by FDA under an Emergency Use Authorization (EUA). This EUA will remain in effect (meaning this test can be used) for the duration of the COVID-19 declaration under Section 564(b)(1) of the Act, 21 U.S.C. section 360bbb-3(b)(1), unless the authorization is terminated or revoked.  Performed at [facility], [address] [place], [place] [address]   Blood culture (routine x 2)     Status: None (Preliminary result)   Collection Time: 07/22/21 12:57 PM   Specimen: BLOOD  Result Value Ref Range Status    Specimen Description BLOOD RIGHT ANTECUBITAL  Final   Special Requests   Final    BOTTLES DRAWN AEROBIC AND ANAEROBIC Blood Culture adequate volume   Culture   Final    NO GROWTH 3 DAYS Performed at [facility], [address] [place], [place] [address]    Report Status PENDING  Incomplete  Blood culture (routine x 2)     Status: None (Preliminary result)   Collection Time: 07/22/21  1:16 PM   Specimen: BLOOD  Result Value Ref Range Status   Specimen Description BLOOD LEFT ANTECUBITAL  Final   Special Requests   Final    BOTTLES DRAWN AEROBIC AND ANAEROBIC Blood Culture results may not be optimal due to an inadequate volume of blood received in culture bottles   Culture   Final    NO GROWTH 3 DAYS Performed at [facility], [address] [place], [place] [address]    Report Status PENDING  Incomplete  Aerobic/Anaerobic Culture w Gram Stain (surgical/deep wound)     Status: None (Preliminary result)   Collection Time: 07/24/21  7:02 PM   Specimen: PATH Other; Tissue  Result Value Ref Range Status   Specimen Description   Final    WOUND Performed at [facility], [address], [place], [place] [address]    Special Requests   Final    RIGHT GREAT TOE Performed at [facility], [address] [place], [place] [address]    Gram Stain   Final    RARE WBC PRESENT,BOTH PMN AND MONONUCLEAR FEW GRAM POSITIVE COCCI IN PAIRS FEW GRAM NEGATIVE COCCOBACILLI    Culture   Final    TOO YOUNG TO READ  Performed at [facility], [place] [address], [place], [place] [address]    Report Status PENDING  Incomplete    RADIOLOGY STUDIES/RESULTS: DG MINI C-ARM IMAGE ONLY  Result Date: 07/24/2021 There is no interpretation for this exam.  This order is for images obtained during a surgical procedure.  Please See "Surgeries" Tab for more information regarding the procedure.     LOS: 3 days   Oren Binet, MD  [facility]    To  contact the attending provider between 7A-7P or the covering provider during after hours 7P-7A, please log into the web site [email or web address] and access using universal [place] password for that web site. If you do not have the password, please call the hospital operator.  07/25/2021, 12:38 PM

## 2021-07-26 LAB — C-REACTIVE PROTEIN: CRP: 6.9 mg/dL — ABNORMAL HIGH (ref <1.0)

## 2021-07-26 LAB — GLUCOSE, CAPILLARY: Glucose-Capillary: 216 mg/dL — ABNORMAL HIGH (ref 70–99)

## 2021-07-26 MED ORDER — AMOXICILLIN-POT CLAVULANATE 875-125 MG PO TABS: 1.0000 | ORAL_TABLET | Freq: Two times a day (BID) | ORAL | 0 refills | Status: AC

## 2021-07-26 MED ORDER — CIPROFLOXACIN HCL 500 MG PO TABS: 500.0000 mg | ORAL_TABLET | Freq: Two times a day (BID) | ORAL | 0 refills | Status: AC

## 2021-07-26 NOTE — Discharge Summary (Signed)
PATIENT DETAILS Name: Gabriella Marsh Age: 55 y.o. Sex: female Date of Birth: [birth year] MRN: [id number]. Admitting Physician: Ivor Costa, MD [id number] [facility]  Admit Date: 07/22/2021 Discharge date: 07/26/2021  Recommendations for Outpatient Follow-up:  Follow up with PCP in 1-2 weeks Please obtain CMP/CBC in one week Please ensure follow up with Podiatry  Admitted From:  Home  Disposition: [place]: No  Equipment/Devices: None  Discharge Condition: Stable  CODE STATUS: FULL CODE  Diet recommendation:  Diet Order             Diet - low sodium heart healthy           Diet Carb Modified           Diet Carb Modified Fluid consistency: Thin; Room service appropriate? Yes  Diet effective now                    Brief Narrative: Patient is a 55 y.o. female with history of DM, peripheral neuropathy, HTN, HLD, GERD, hypothyroidism-who presented with diabetic foot ulcer involving right great toe-further evaluation with MRI showed underlying osteomyelitis.   Significant events: 8/2>> admit for diabetic right toe infection with underlying osteomyelitis.   Significant studies: 8/2>> CXR: No active disease 8/2>> MRI right foot: Bony edema/enhancement of great toe distal phalanx-likely early osteomyelitis   Antimicrobial therapy: Rocephin: 8/2>>8/5 Vancomycin: 8/3>>8/5 Unasyn 8/5>>   Microbiology data: 8/2>> blood culture: Negative 8/2>> superficial wound swab: Staff aureus 8/4>> surgical/deep wound culture: Pending   Procedures : 8/4>> Amputation right hallux.   Consults: [place] [facility] Course: Sepsis due to right diabetic toe infection with underlying osteomyelitis: Sepsis physiology has resolved-on IV antimicrobial therapy-underwent right toe amputation on 8/4.  Discussed with podiatry MD-at bedside this morning-recommendations are for discharge-no further wound care until seen by podiatry in the outpatient  setting.  Podiatry MD recommends discharging patient on both ciprofloxacin and Augmentin for 1 week.  Continue to monitor intraoperative cultures-negative so far.    COVID-19 infection: Asymptomatic-watch closely for now.   HTN: BP stable-off antihypertensives-but systolic BP in the 0000000 this morning-suspect should be able to resume both lisinopril and diuretics on discharge.  Follow with PCP.     DM-2 (A1c 9.4 on 3/16): CBG stable on SSI while inpatient-resume usual outpatient regimen on discharge.  Follow with PCP for further optimization.  GERD: Continue PPI   Hypothyroidism: Continue Synthroid   HLD: On statin   Peripheral neuropathy: Continue Neurontin   Depression with anxiety: Appears stable-continue nortriptyline   Morbid Obesity: Estimated body mass index is 41.15 kg/m as calculated from the following:   Height as of this encounter: '5\' 2"'$  (1.575 m).   Weight as of this encounter: 102.1 kg.     COVID-19 Labs:  Recent Labs    07/24/21 0347 07/25/21 0625 07/26/21 0500  CRP 9.1* 9.0* 6.9*    Lab Results  Component Value Date   SARSCOV2NAA POSITIVE (A) 07/22/2021   [place] NEGATIVE 03/26/2021   [place] NEGATIVE 02/12/2021     Obesity: Estimated body mass index is 41.15 kg/m as calculated from the following:   Height as of this encounter: '5\' 2"'$  (1.575 m).   Weight as of this encounter: 102.1 kg.    Discharge Diagnoses:  Principal Problem:   Diabetic foot infection ([place]) Active Problems:   Diabetic neuropathy ([place])   Essential hypertension   GERD (gastroesophageal reflux disease)   Type 2 diabetes mellitus with diabetic neuropathy,  without long-term current use of insulin (HCC)   Hypercholesteremia   Chronic pain syndrome   Depression with anxiety   Sepsis ([place])   COVID-19 virus infection   Discharge Instructions:    Person Under Monitoring Name: Maydean Reichle  Location: [address] [place] [address]   Infection  Prevention Recommendations for Individuals Confirmed to have, or Being Evaluated for, 2019 Novel Coronavirus (COVID-19) Infection Who Receive Care at Home  Individuals who are confirmed to have, or are being evaluated for, COVID-19 should follow the prevention steps below until a healthcare provider or local or state health department says they can return to normal activities.  Stay home except to get medical care You should restrict activities outside your home, except for getting medical care. Do not go to work, school, or public areas, and do not use public transportation or taxis.  Call ahead before visiting your doctor Before your medical appointment, call the healthcare provider and tell them that you have, or are being evaluated for, COVID-19 infection. This will help the healthcare provider's office take steps to keep other people from getting infected. Ask your healthcare provider to call the local or state health department.  Monitor your symptoms Seek prompt medical attention if your illness is worsening (e.g., difficulty breathing). Before going to your medical appointment, call the healthcare provider and tell them that you have, or are being evaluated for, COVID-19 infection. Ask your healthcare provider to call the local or state health department.  Wear a facemask You should wear a facemask that covers your nose and mouth when you are in the same room with other people and when you visit a healthcare provider. People who live with or visit you should also wear a facemask while they are in the same room with you.  Separate yourself from other people in your home As much as possible, you should stay in a different room from other people in your home. Also, you should use a separate bathroom, if available.  Avoid sharing household items You should not share dishes, drinking glasses, cups, eating utensils, towels, bedding, or other items with other people in your home.  After using these items, you should wash them thoroughly with soap and water.  Cover your coughs and sneezes Cover your mouth and nose with a tissue when you cough or sneeze, or you can cough or sneeze into your sleeve. Throw used tissues in a lined trash can, and immediately wash your hands with soap and water for at least 20 seconds or use an alcohol-based hand rub.  Wash your [facility] your hands often and thoroughly with soap and water for at least 20 seconds. You can use an alcohol-based hand sanitizer if soap and water are not available and if your hands are not visibly dirty. Avoid touching your eyes, nose, and mouth with unwashed hands.   Prevention Steps for Caregivers and Household Members of Individuals Confirmed to have, or Being Evaluated for, COVID-19 Infection Being Cared for in the Home  If you live with, or provide care at home for, a person confirmed to have, or being evaluated for, COVID-19 infection please follow these guidelines to prevent infection:  Follow healthcare provider's instructions Make sure that you understand and can help the patient follow any healthcare provider instructions for all care.  Provide for the patient's basic needs You should help the patient with basic needs in the home and provide support for getting groceries, prescriptions, and other personal needs.  Monitor the patient's symptoms If they are getting sicker, call his or her medical provider and tell them that the patient has, or is being evaluated for, COVID-19 infection. This will help the healthcare provider's office take steps to keep other people from getting infected. Ask the healthcare provider to call the local or state health department.  Limit the number of people who have contact with the patient If possible, have only one caregiver for the patient. Other household members should stay in another home or place of residence. If this is not possible, they should stay in  another room, or be separated from the patient as much as possible. Use a separate bathroom, if available. Restrict visitors who do not have an essential need to be in the home.  Keep older adults, very young children, and other sick people away from the patient Keep older adults, very young children, and those who have compromised immune systems or chronic health conditions away from the patient. This includes people with chronic heart, lung, or kidney conditions, diabetes, and cancer.  Ensure good ventilation Make sure that shared spaces in the home have good air flow, such as from an air conditioner or an opened window, weather permitting.  Wash your hands often Wash your hands often and thoroughly with soap and water for at least 20 seconds. You can use an alcohol based hand sanitizer if soap and water are not available and if your hands are not visibly dirty. Avoid touching your eyes, nose, and mouth with unwashed hands. Use disposable paper towels to dry your hands. If not available, use dedicated cloth towels and replace them when they become wet.  Wear a facemask and gloves Wear a disposable facemask at all times in the room and gloves when you touch or have contact with the patient's blood, body fluids, and/or secretions or excretions, such as sweat, saliva, sputum, nasal mucus, vomit, urine, or feces.  Ensure the mask fits over your nose and mouth tightly, and do not touch it during use. Throw out disposable facemasks and gloves after using them. Do not reuse. Wash your hands immediately after removing your facemask and gloves. If your personal clothing becomes contaminated, carefully remove clothing and launder. Wash your hands after handling contaminated clothing. Place all used disposable facemasks, gloves, and other waste in a lined container before disposing them with other household waste. Remove gloves and wash your hands immediately after handling these items.  Do not share  dishes, glasses, or other household items with the patient Avoid sharing household items. You should not share dishes, drinking glasses, cups, eating utensils, towels, bedding, or other items with a patient who is confirmed to have, or being evaluated for, COVID-19 infection. After the person uses these items, you should wash them thoroughly with soap and water.  Wash laundry thoroughly Immediately remove and wash clothes or bedding that have blood, body fluids, and/or secretions or excretions, such as sweat, saliva, sputum, nasal mucus, vomit, urine, or feces, on them. Wear gloves when handling laundry from the patient. Read and follow directions on labels of laundry or clothing items and detergent. In general, wash and dry with the warmest temperatures recommended on the label.  Clean all areas the individual has used often Clean all touchable surfaces, such as counters, tabletops, doorknobs, bathroom fixtures, toilets, phones, keyboards, tablets, and bedside tables, every day. Also, clean any surfaces that may have blood, body fluids, and/or secretions or excretions on them. Wear gloves when cleaning surfaces the patient has  come in contact with. Use a diluted bleach solution (e.g., dilute bleach with 1 part bleach and 10 parts water) or a household disinfectant with a label that says EPA-registered for coronaviruses. To make a bleach solution at home, add 1 tablespoon of bleach to 1 quart (4 cups) of water. For a larger supply, add  cup of bleach to 1 gallon (16 cups) of water. Read labels of cleaning products and follow recommendations provided on product labels. Labels contain instructions for safe and effective use of the cleaning product including precautions you should take when applying the product, such as wearing gloves or eye protection and making sure you have good ventilation during use of the product. Remove gloves and wash hands immediately after cleaning.  Monitor yourself for  signs and symptoms of illness Caregivers and household members are considered close contacts, should monitor their health, and will be asked to limit movement outside of the home to the extent possible. Follow the monitoring steps for close contacts listed on the symptom monitoring form.   ? If you have additional questions, contact your local health department or call the epidemiologist on call at [phone] (available 24/7). ? This guidance is subject to change. For the most up-to-date guidance from CDC, please refer to their website: YouBlogs.pl    Activity:  As tolerated with Full fall precautions use walker/cane & assistance as needed   Discharge Instructions     Call MD for:  redness, tenderness, or signs of infection (pain, swelling, redness, odor or green/yellow discharge around incision site)   Complete by: As directed    Diet - low sodium heart healthy   Complete by: As directed    Diet Carb Modified   Complete by: As directed    Discharge instructions   Complete by: As directed    Follow with Primary MD  [place] in 1-2 weeks  Follow-up with podiatry in 1 week  Keep dressing dry and clean-NS dressing change will be at the podiatrist office.  Please get a complete blood count and chemistry panel checked by your Primary MD at your next visit, and again as instructed by your Primary MD.  Get Medicines reviewed and adjusted: Please take all your medications with you for your next visit with your Primary MD  Laboratory/radiological data: Please request your Primary MD to go over all hospital tests and procedure/radiological results at the follow up, please ask your Primary MD to get all Hospital records sent to his/her office.  In some cases, they will be blood work, cultures and biopsy results pending at the time of your discharge. Please request that your primary care M.D. follows up on  these results.  Also Note the following: If you experience worsening of your admission symptoms, develop shortness of breath, life threatening emergency, suicidal or homicidal thoughts you must seek medical attention immediately by calling 911 or calling your MD immediately  if symptoms less severe.  You must read complete instructions/literature along with all the possible adverse reactions/side effects for all the Medicines you take and that have been prescribed to you. Take any new Medicines after you have completely understood and accpet all the possible adverse reactions/side effects.   Do not drive when taking Pain medications or sleeping medications (Benzodaizepines)  Do not take more than prescribed Pain, Sleep and Anxiety Medications. It is not advisable to combine anxiety,sleep and pain medications without talking with your primary care practitioner  Special Instructions: If you have smoked or chewed Tobacco  in the last 2 yrs please stop smoking, stop any regular Alcohol  and or any Recreational drug use.  Wear Seat belts while driving.  Please note: You were cared for by a hospitalist during your hospital stay. Once you are discharged, your primary care physician will handle any further medical issues. Please note that NO REFILLS for any discharge medications will be authorized once you are discharged, as it is imperative that you return to your primary care physician (or establish a relationship with a primary care physician if you do not have one) for your post hospital discharge needs so that they can reassess your need for medications and monitor your lab values.   Increase activity slowly   Complete by: As directed    Leave dressing on - Keep it clean, dry, and intact until clinic visit   Complete by: As directed       Allergies as of 07/26/2021       Reactions   Celecoxib Shortness Of Breath   Pregabalin Shortness Of Breath   Buspar [buspirone] Other (See Comments)    Tachycardia   Citalopram Cough        Medication List     STOP taking these medications    Tresiba FlexTouch 100 UNIT/ML FlexTouch Pen Generic drug: insulin degludec       TAKE these medications    amoxicillin-clavulanate 875-125 MG tablet Commonly known as: Augmentin Take 1 tablet by mouth 2 (two) times daily for 7 days.   atorvastatin 20 MG tablet Commonly known as: LIPITOR Take 1 tablet (20 mg total) by mouth daily.   ciprofloxacin 500 MG tablet Commonly known as: Cipro Take 1 tablet (500 mg total) by mouth 2 (two) times daily for 7 days.   cyclobenzaprine 5 MG tablet Commonly known as: FLEXERIL Take 1 tablet (5 mg total) by mouth 3 (three) times daily as needed for muscle spasms.   furosemide 20 MG tablet Commonly known as: LASIX TAKE 2 TABLETS(40 MG) BY MOUTH DAILY AS NEEDED FOR FLUID RETENTION OR SWELLING What changed:  how much to take how to take this when to take this additional instructions   gabapentin 600 MG tablet Commonly known as: NEURONTIN TAKE 1 TABLET BY MOUTH 3  TIMES DAILY   gabapentin 100 MG capsule Commonly known as: NEURONTIN TAKE 2 CAPSULES THREE TIMES A DAY ALONG WITH 600 MG THREE TIMES A DAY FOR A TOTAL DAILY DOSE OF 2400 MG DAILY   glipiZIDE 10 MG tablet Commonly known as: GLUCOTROL TAKE ONE-HALF TABLET BY  MOUTH TWICE DAILY BEFORE  MEALS What changed:  how much to take how to take this when to take this additional instructions   ibuprofen 600 MG tablet Commonly known as: ADVIL Take 1 tablet (600 mg total) by mouth every 6 (six) hours as needed for mild pain.   levothyroxine 150 MCG tablet Commonly known as: SYNTHROID Take 1 tablet (150 mcg total) by mouth daily at 6 (six) AM.   lisinopril 5 MG tablet Commonly known as: ZESTRIL TAKE 1 TABLET(5 MG) BY MOUTH DAILY   Melatonin 10 MG Tabs Take 10 mg by mouth at bedtime.   metFORMIN 500 MG tablet Commonly known as: GLUCOPHAGE Take 2 tablets (1,000 mg total) by mouth 2  (two) times daily with a meal.   nortriptyline 25 MG capsule Commonly known as: PAMELOR Take 2 capsules (50 mg total) by mouth at bedtime.   omeprazole 20 MG capsule Commonly known as: PRILOSEC Take 1 capsule (20 mg total)  by mouth daily.   oxyCODONE-acetaminophen 5-325 MG tablet Commonly known as: PERCOCET/ROXICET Take 1 tablet by mouth every 6 (six) hours as needed for severe pain.   traZODone 50 MG tablet Commonly known as: DESYREL TAKE 1 TO 2 TABLETS(50 TO 100 MG) BY MOUTH AT BEDTIME AS NEEDED FOR SLEEP   triamterene-hydrochlorothiazide 37.5-25 MG tablet Commonly known as: MAXZIDE-25 TAKE 1 TABLET BY MOUTH  DAILY   Trulicity 1.5 0000000 Sopn Generic drug: Dulaglutide ADMINISTER 1.5 MG UNDER THE SKIN 1 TIME A WEEK What changed: See the new instructions.       ASK your doctor about these medications    calcitRIOL 0.25 MCG capsule Commonly known as: ROCALTROL Take 1 capsule (0.25 mcg total) by mouth daily.   calcium carbonate 1250 (500 Ca) MG tablet Commonly known as: OS-CAL - dosed in mg of elemental calcium Take 2 tablets (1,000 mg of elemental calcium total) by mouth 3 (three) times daily with meals.   clopidogrel 75 MG tablet Commonly known as: PLAVIX Take 1 tablet (75 mg total) by mouth daily.               Discharge Care Instructions  (From admission, onward)           Start     Ordered   07/26/21 0000  Leave dressing on - Keep it clean, dry, and intact until clinic visit        07/26/21 [place]. Schedule an appointment as soon as possible for a visit in 1 week(s).   Contact information: [address] [place] [address] [id number]         Sharlotte Alamo, DPM. Schedule an appointment as soon as possible for a visit in 1 week(s).   Specialty: Podiatry Contact information: [place] [address] [phone]                 Allergies  Allergen Reactions   Celecoxib Shortness Of Breath   Pregabalin Shortness Of Breath   Buspar [Buspirone] Other (See Comments)    Tachycardia   Citalopram Cough     Other Procedures/Studies: MR FOOT RIGHT W WO CONTRAST  Result Date: 07/22/2021 CLINICAL DATA:  Foot swelling, diabetic, osteomyelitis suspected, xray done EXAM: MRI OF THE RIGHT FOREFOOT WITHOUT AND WITH CONTRAST TECHNIQUE: Multiplanar, multisequence MR imaging of the right forefoot was performed before and after the administration of intravenous contrast. CONTRAST:  93m GADAVIST GADOBUTROL 1 MMOL/ML IV SOLN COMPARISON:  None. FINDINGS: Bones/Joint/Cartilage There is no evidence of acute fracture. There is edema signal and enhancement throughout the distal phalanx of the great toe, with preserved T1 marrow signal. Ligaments Intact Lisfranc ligament.  No evidence of plantar plate tear Muscles and Tendons Mild muscle atrophy throughout the foot. No evidence of acute tendon tear. Soft tissues There is a plantar soft tissue ulcer along the distal aspect of the great toe. There is distal great toe soft tissue swelling and skin thickening. There is a nonenhancing T2 hyperintense cystic lesion along the plantar lateral soft tissues of the midfoot compatible with a ganglion cyst measuring 1.2 x 0.7 cm (axial T2 image 47). There is extensive soft tissue swelling, predominantly superficially along the dorsal foot. IMPRESSION: Plantar soft tissue ulcer along the distal aspect of the great toe. Bony edema and enhancement in the great toe distal phalanx with preserved T1 marrow signal. Given  the adjacent ulcer, this probably represents early osteomyelitis, though reactive marrow signal can also have this appearance. 1.2 cm ganglion cyst along the plantar soft tissues of the midfoot. Electronically Signed   By: Maurine Simmering   On: 07/22/2021 18:56   DG Chest Port 1 View  Result Date: 07/22/2021 CLINICAL DATA:  COVID toe infection EXAM:  PORTABLE CHEST 1 VIEW COMPARISON:  12/22/2016, 04/01/2020 FINDINGS: No focal opacity or pleural effusion. Normal cardiac size. No pneumothorax. IMPRESSION: No active disease. Electronically Signed   By: Donavan Foil M.D.   On: 07/22/2021 16:41   DG MINI C-ARM IMAGE ONLY  Result Date: 07/24/2021 There is no interpretation for this exam.  This order is for images obtained during a surgical procedure.  Please See "Surgeries" Tab for more information regarding the procedure.     TODAY-DAY OF DISCHARGE:  Subjective:   Genesy Minch today has no headache,no chest abdominal pain,no new weakness tingling or numbness, feels much better wants to go home today.  Objective:   Blood pressure (!) 143/86, pulse 89, temperature 97.9 F (36.6 C), resp. rate 16, height '5\' 2"'$  (1.575 m), weight 102.1 kg, SpO2 97 %. No intake or output data in the 24 hours ending 07/26/21 0958 Filed Weights   07/22/21 1152  Weight: 102.1 kg    Exam: Awake Alert, Oriented *3, No new F.N deficits, Normal affect [place].AT,PERRAL Supple Neck,No JVD, No cervical lymphadenopathy appriciated.  Symmetrical Chest wall movement, Good air movement bilaterally, CTAB RRR,No Gallops,Rubs or new Murmurs, No Parasternal Heave +ve B.Sounds, Abd Soft, Non tender, No organomegaly appriciated, No rebound -guarding or rigidity. No Cyanosis, Clubbing or edema, No new Rash or bruise   PERTINENT RADIOLOGIC STUDIES: MR FOOT RIGHT W WO CONTRAST  Result Date: 07/22/2021 CLINICAL DATA:  Foot swelling, diabetic, osteomyelitis suspected, xray done EXAM: MRI OF THE RIGHT FOREFOOT WITHOUT AND WITH CONTRAST TECHNIQUE: Multiplanar, multisequence MR imaging of the right forefoot was performed before and after the administration of intravenous contrast. CONTRAST:  66m GADAVIST GADOBUTROL 1 MMOL/ML IV SOLN COMPARISON:  None. FINDINGS: Bones/Joint/Cartilage There is no evidence of acute fracture. There is edema signal and enhancement throughout the distal  phalanx of the great toe, with preserved T1 marrow signal. Ligaments Intact Lisfranc ligament.  No evidence of plantar plate tear Muscles and Tendons Mild muscle atrophy throughout the foot. No evidence of acute tendon tear. Soft tissues There is a plantar soft tissue ulcer along the distal aspect of the great toe. There is distal great toe soft tissue swelling and skin thickening. There is a nonenhancing T2 hyperintense cystic lesion along the plantar lateral soft tissues of the midfoot compatible with a ganglion cyst measuring 1.2 x 0.7 cm (axial T2 image 47). There is extensive soft tissue swelling, predominantly superficially along the dorsal foot. IMPRESSION: Plantar soft tissue ulcer along the distal aspect of the great toe. Bony edema and enhancement in the great toe distal phalanx with preserved T1 marrow signal. Given the adjacent ulcer, this probably represents early osteomyelitis, though reactive marrow signal can also have this appearance. 1.2 cm ganglion cyst along the plantar soft tissues of the midfoot. Electronically Signed   By: JMaurine Simmering  On: 07/22/2021 18:56   DG Chest Port 1 View  Result Date: 07/22/2021 CLINICAL DATA:  COVID toe infection EXAM: PORTABLE CHEST 1 VIEW COMPARISON:  12/22/2016, 04/01/2020 FINDINGS: No focal opacity or pleural effusion. Normal cardiac size. No pneumothorax. IMPRESSION: No active disease. Electronically Signed   By: KDonavan Foil  M.D.   On: 07/22/2021 16:41   DG MINI C-ARM IMAGE ONLY  Result Date: 07/24/2021 There is no interpretation for this exam.  This order is for images obtained during a surgical procedure.  Please See "Surgeries" Tab for more information regarding the procedure.     PERTINENT LAB RESULTS: CBC: Recent Labs    07/24/21 0347  WBC 9.8  HGB 11.0*  HCT 33.5*  PLT 426*   CMET CMP     Component Value Date/Time   NA 136 07/24/2021 0347   NA 136 03/05/2021 0934   K 4.1 07/24/2021 0347   CL 96 (L) 07/24/2021 0347   CO2 32  07/24/2021 0347   GLUCOSE 223 (H) 07/24/2021 0347   BUN 8 07/24/2021 0347   BUN 14 03/05/2021 0934   CREATININE 0.52 07/25/2021 0626   CALCIUM 7.2 (L) 07/24/2021 0347   PROT 6.8 07/24/2021 0347   PROT 7.6 03/05/2021 0934   ALBUMIN 2.7 (L) 07/24/2021 0347   ALBUMIN 4.4 03/05/2021 0934   AST 10 (L) 07/24/2021 0347   ALT 12 07/24/2021 0347   ALKPHOS 63 07/24/2021 0347   BILITOT 0.9 07/24/2021 0347   BILITOT 0.7 03/05/2021 0934   GFRNONAA >60 07/25/2021 0626   GFRAA 92 01/03/2021 1613    GFR Estimated Creatinine Clearance: 90 mL/min (by C-G formula based on SCr of 0.52 mg/dL). No results for input(s): LIPASE, AMYLASE in the last 72 hours. No results for input(s): CKTOTAL, CKMB, CKMBINDEX, TROPONINI in the last 72 hours. Invalid input(s): POCBNP No results for input(s): DDIMER in the last 72 hours. No results for input(s): HGBA1C in the last 72 hours. No results for input(s): CHOL, HDL, LDLCALC, TRIG, CHOLHDL, LDLDIRECT in the last 72 hours. No results for input(s): TSH, T4TOTAL, T3FREE, THYROIDAB in the last 72 hours.  Invalid input(s): FREET3 No results for input(s): VITAMINB12, FOLATE, FERRITIN, TIBC, IRON, RETICCTPCT in the last 72 hours. Coags: No results for input(s): INR in the last 72 hours.  Invalid input(s): PT Microbiology: Recent Results (from the past 240 hour(s))  Aerobic Culture w Gram Stain (superficial specimen)     Status: None   Collection Time: 07/22/21 11:45 AM   Specimen: Abscess  Result Value Ref Range Status   Specimen Description   Final    ABSCESS Performed at [facility], [address] [place], [place] [address]    Special Requests   Final    RIGHT FOOT Performed at [facility], [address] [place], [place] [address]    Gram Stain   Final    FEW WBC PRESENT,BOTH PMN AND MONONUCLEAR FEW GRAM POSITIVE COCCI IN PAIRS FEW GRAM NEGATIVE RODS Performed at [facility], [place] [address] [place], [place] [address]     Culture   Final    ABUNDANT STAPHYLOCOCCUS AUREUS MODERATE ESCHERICHIA COLI ABUNDANT STREPTOCOCCUS INTERMEDIUS    Report Status 07/25/2021 FINAL  Final   Organism ID, Bacteria STAPHYLOCOCCUS AUREUS  Final   Organism ID, Bacteria ESCHERICHIA COLI  Final   Organism ID, Bacteria STREPTOCOCCUS INTERMEDIUS  Final      Susceptibility   Escherichia coli - MIC*    AMPICILLIN <=2 SENSITIVE Sensitive     CEFAZOLIN <=4 SENSITIVE Sensitive     CEFEPIME <=0.12 SENSITIVE Sensitive     CEFTAZIDIME <=1 SENSITIVE Sensitive     CEFTRIAXONE <=0.25 SENSITIVE Sensitive     CIPROFLOXACIN <=0.25 SENSITIVE Sensitive     GENTAMICIN <=1 SENSITIVE Sensitive     IMIPENEM <=0.25 SENSITIVE  Sensitive     TRIMETH/SULFA <=20 SENSITIVE Sensitive     AMPICILLIN/SULBACTAM <=2 SENSITIVE Sensitive     PIP/TAZO <=4 SENSITIVE Sensitive     * MODERATE ESCHERICHIA COLI   Staphylococcus aureus - MIC*    CIPROFLOXACIN <=0.5 SENSITIVE Sensitive     ERYTHROMYCIN RESISTANT Resistant     GENTAMICIN <=0.5 SENSITIVE Sensitive     OXACILLIN <=0.25 SENSITIVE Sensitive     TETRACYCLINE <=1 SENSITIVE Sensitive     VANCOMYCIN 1 SENSITIVE Sensitive     TRIMETH/SULFA <=10 SENSITIVE Sensitive     CLINDAMYCIN RESISTANT Resistant     RIFAMPIN <=0.5 SENSITIVE Sensitive     Inducible Clindamycin POSITIVE Resistant     * ABUNDANT STAPHYLOCOCCUS AUREUS   Streptococcus intermedius - MIC*    PENICILLIN <=0.06 SENSITIVE Sensitive     CEFTRIAXONE <=0.12 SENSITIVE Sensitive     ERYTHROMYCIN <=0.12 SENSITIVE Sensitive     LEVOFLOXACIN 2 SENSITIVE Sensitive     VANCOMYCIN 0.5 SENSITIVE Sensitive     * ABUNDANT STREPTOCOCCUS INTERMEDIUS  Resp Panel by RT-PCR (Flu A&B, Covid) Nasopharyngeal Swab     Status: Abnormal   Collection Time: 07/22/21 12:57 PM   Specimen: Nasopharyngeal Swab; Nasopharyngeal(NP) swabs in vial transport medium  Result Value Ref Range Status   SARS Coronavirus 2 by RT PCR POSITIVE (A) NEGATIVE Final    Comment:  RESULT CALLED TO, READ BACK BY AND VERIFIED WITH: ADDISON GREGORY 07/22/21 @ 1403 BY S BEARD (NOTE) SARS-CoV-2 target nucleic acids are DETECTED.  The SARS-CoV-2 RNA is generally detectable in upper respiratory specimens during the acute phase of infection. Positive results are indicative of the presence of the identified virus, but do not rule out bacterial infection or co-infection with other pathogens not detected by the test. Clinical correlation with patient history and other diagnostic information is necessary to determine patient infection status. The expected result is Negative.  Fact Sheet for Patients: EntrepreneurPulse.com.au  Fact Sheet for Healthcare Providers: IncredibleEmployment.be  This test is not yet approved or cleared by the [place] FDA and  has been authorized for detection and/or diagnosis of SARS-CoV-2 by FDA under an Emergency Use Authorization (EUA).  This EUA will remain in effect (meaning this test  can be used) for the duration of  the COVID-19 declaration under Section 564(b)(1) of the Act, 21 U.S.C. section 360bbb-3(b)(1), unless the authorization is terminated or revoked sooner.     Influenza A by PCR NEGATIVE NEGATIVE Final   Influenza B by PCR NEGATIVE NEGATIVE Final    Comment: (NOTE) The Xpert Xpress SARS-CoV-2/FLU/RSV plus assay is intended as an aid in the diagnosis of influenza from Nasopharyngeal swab specimens and should not be used as a sole basis for treatment. Nasal washings and aspirates are unacceptable for Xpert Xpress SARS-CoV-2/FLU/RSV testing.  Fact Sheet for Patients: EntrepreneurPulse.com.au  Fact Sheet for Healthcare Providers: IncredibleEmployment.be  This test is not yet approved or cleared by the [place] FDA and has been authorized for detection and/or diagnosis of SARS-CoV-2 by FDA under an Emergency Use Authorization (EUA). This EUA  will remain in effect (meaning this test can be used) for the duration of the COVID-19 declaration under Section 564(b)(1) of the Act, 21 U.S.C. section 360bbb-3(b)(1), unless the authorization is terminated or revoked.  Performed at [facility], [address] [place], [place] [address]   Blood culture (routine x 2)     Status: None (Preliminary result)   Collection Time: 07/22/21 12:57 PM   Specimen: BLOOD  Result  Value Ref Range Status   Specimen Description BLOOD RIGHT ANTECUBITAL  Final   Special Requests   Final    BOTTLES DRAWN AEROBIC AND ANAEROBIC Blood Culture adequate volume   Culture   Final    NO GROWTH 4 DAYS Performed at [facility], [address], [place], [place] [address]    Report Status PENDING  Incomplete  Blood culture (routine x 2)     Status: None (Preliminary result)   Collection Time: 07/22/21  1:16 PM   Specimen: BLOOD  Result Value Ref Range Status   Specimen Description BLOOD LEFT ANTECUBITAL  Final   Special Requests   Final    BOTTLES DRAWN AEROBIC AND ANAEROBIC Blood Culture results may not be optimal due to an inadequate volume of blood received in culture bottles   Culture   Final    NO GROWTH 4 DAYS Performed at [facility], [address] [place], [place] [address]    Report Status PENDING  Incomplete  Aerobic/Anaerobic Culture w Gram Stain (surgical/deep wound)     Status: None (Preliminary result)   Collection Time: 07/24/21  7:02 PM   Specimen: PATH Other; Tissue  Result Value Ref Range Status   Specimen Description   Final    WOUND Performed at [facility], [address], [place], [place] [address]    Special Requests   Final    RIGHT GREAT TOE Performed at [facility], [address] [place], [place] [address]    Gram Stain   Final    RARE WBC PRESENT,BOTH PMN AND MONONUCLEAR FEW GRAM POSITIVE COCCI IN PAIRS FEW GRAM NEGATIVE COCCOBACILLI    Culture   Final    TOO  YOUNG TO READ Performed at [facility], [place] [address], [place], [place] [address]    Report Status PENDING  Incomplete    FURTHER DISCHARGE INSTRUCTIONS:  Get Medicines reviewed and adjusted: Please take all your medications with you for your next visit with your Primary MD  Laboratory/radiological data: Please request your Primary MD to go over all hospital tests and procedure/radiological results at the follow up, please ask your Primary MD to get all Hospital records sent to his/her office.  In some cases, they will be blood work, cultures and biopsy results pending at the time of your discharge. Please request that your primary care M.D. goes through all the records of your hospital data and follows up on these results.  Also Note the following: If you experience worsening of your admission symptoms, develop shortness of breath, life threatening emergency, suicidal or homicidal thoughts you must seek medical attention immediately by calling 911 or calling your MD immediately  if symptoms less severe.  You must read complete instructions/literature along with all the possible adverse reactions/side effects for all the Medicines you take and that have been prescribed to you. Take any new Medicines after you have completely understood and accpet all the possible adverse reactions/side effects.   Do not drive when taking Pain medications or sleeping medications (Benzodaizepines)  Do not take more than prescribed Pain, Sleep and Anxiety Medications. It is not advisable to combine anxiety,sleep and pain medications without talking with your primary care practitioner  Special Instructions: If you have smoked or chewed Tobacco  in the last 2 yrs please stop smoking, stop any regular Alcohol  and or any Recreational drug use.  Wear Seat belts while driving.  Please note: You were cared for by a hospitalist during  your hospital stay. Once you are discharged, your primary care  physician will handle any further medical issues. Please note that NO REFILLS for any discharge medications will be authorized once you are discharged, as it is imperative that you return to your primary care physician (or establish a relationship with a primary care physician if you do not have one) for your post hospital discharge needs so that they can reassess your need for medications and monitor your lab values.  Total Time spent coordinating discharge including counseling, education and face to face time equals 35 minutes.  SignedOren Binet 07/26/2021 9:58 AM

## 2021-07-26 NOTE — Progress Notes (Signed)
Discharge   Patient discharged to home. AVS was reviewed with the patient and her husband at the bedside. All questions answered. Patient confirmed prescriptions were sent to the correct pharmacy and she has all of her belongings. She has not seen PT, offered to request a consult to determine if she would need home health PT or equipment, but she refused. Her reason being she does not want to delay discharge. She has access to a walker at home and has support from family, husband and son. Husband will provide transportation. She will follow up with podiatry and PCP, patient will make her own appointments.  Nurse tech called to assist patient to the exit.

## 2021-07-26 NOTE — Progress Notes (Signed)
2 Days Post-Op   Subjective/Chief Complaint: Patient seen.  No significant complaints of pain with her toe.   Objective: Vital signs in last 24 hours: Temp:  [97.9 F (36.6 C)-98.6 F (37 C)] 97.9 F (36.6 C) (08/06 0826) Pulse Rate:  [81-89] 89 (08/06 0826) Resp:  [16-18] 16 (08/06 0826) BP: (123-143)/(74-86) 143/86 (08/06 0826) SpO2:  [95 %-97 %] 97 % (08/06 0826) Last BM Date: 07/21/21  Intake/Output from previous day: No intake/output data recorded. Intake/Output this shift: No intake/output data recorded.  Bandage is dry and intact.  Upon removal there is some mild dried blood.  The incision is well coapted with some erythema around the amputation site but no active drainage.  Some edema still present at the amputation site.   Lab Results:  Recent Labs    07/24/21 0347  WBC 9.8  HGB 11.0*  HCT 33.5*  PLT 426*   BMET Recent Labs    07/24/21 0347 07/25/21 0626  NA 136  --   K 4.1  --   CL 96*  --   CO2 32  --   GLUCOSE 223*  --   BUN 8  --   CREATININE 0.68 0.52  CALCIUM 7.2*  --    PT/INR No results for input(s): LABPROT, INR in the last 72 hours. ABG No results for input(s): PHART, HCO3 in the last 72 hours.  Invalid input(s): PCO2, PO2  Studies/Results: DG MINI C-ARM IMAGE ONLY  Result Date: 07/24/2021 There is no interpretation for this exam.  This order is for images obtained during a surgical procedure.  Please See "Surgeries" Tab for more information regarding the procedure.    Anti-infectives: Anti-infectives (From admission, onward)    Start     Dose/Rate Route Frequency Ordered Stop   07/25/21 1500  Ampicillin-Sulbactam (UNASYN) 3 g in sodium chloride 0.9 % 100 mL IVPB        3 g 200 mL/hr over 30 Minutes Intravenous Every 6 hours 07/25/21 1341     07/23/21 1600  vancomycin (VANCOREADY) IVPB 1500 mg/300 mL  Status:  Discontinued        1,500 mg 150 mL/hr over 120 Minutes Intravenous Every 24 hours 07/22/21 1453 07/25/21 1341    07/22/21 1500  cefTRIAXone (ROCEPHIN) 2 g in sodium chloride 0.9 % 100 mL IVPB  Status:  Discontinued        2 g 200 mL/hr over 30 Minutes Intravenous Every 24 hours 07/22/21 1403 07/25/21 1340   07/22/21 1445  vancomycin (VANCOREADY) IVPB 1500 mg/300 mL       See Hyperspace for full Linked Orders Report.   1,500 mg 150 mL/hr over 120 Minutes Intravenous  Once 07/22/21 1341 07/24/21 0707   07/22/21 1345  vancomycin (VANCOCIN) IVPB 1000 mg/200 mL premix  Status:  Discontinued       See Hyperspace for full Linked Orders Report.   1,000 mg 200 mL/hr over 60 Minutes Intravenous  Once 07/22/21 1341 07/25/21 1341   07/22/21 1315  vancomycin (VANCOCIN) 2,500 mg in sodium chloride 0.9 % 500 mL IVPB  Status:  Discontinued        2,500 mg 250 mL/hr over 120 Minutes Intravenous  Once 07/22/21 1302 07/22/21 1341   07/22/21 1300  vancomycin (VANCOREADY) IVPB 2000 mg/400 mL  Status:  Discontinued        2,000 mg 200 mL/hr over 120 Minutes Intravenous  Once 07/22/21 1251 07/22/21 1302   07/22/21 1300  piperacillin-tazobactam (ZOSYN) IVPB 4.5 g  Status:  Discontinued        4.5 g 200 mL/hr over 30 Minutes Intravenous  Once 07/22/21 1251 07/22/21 1315       Assessment/Plan: s/p Procedure(s): AMPUTATION TOE (Right) Assessment: Stable status post amputation right hallux.  Plan: Betadine and a sterile bandage reapplied to the right foot.  Patient instructed to keep this clean and dry and do not remove.  Discussed with the patient follow-up this week for reevaluation of the toe.  Recommend discharge on Augmentin and Cipro for antibiotic coverage.  Discussed with the patient she may be weightbearing on the right foot but try to keep the pressure only on the heel and her surgical shoe.  Patient stable for discharge from podiatry standpoint.  Durward Fortes 07/26/2021

## 2021-07-27 LAB — CULTURE, BLOOD (ROUTINE X 2)
Culture: NO GROWTH
Culture: NO GROWTH
Special Requests: ADEQUATE

## 2021-07-28 ENCOUNTER — Encounter: Payer: Self-pay | Admitting: Family Medicine

## 2021-07-28 LAB — AEROBIC/ANAEROBIC CULTURE W GRAM STAIN (SURGICAL/DEEP WOUND)

## 2021-07-29 LAB — SURGICAL PATHOLOGY

## 2021-08-05 ENCOUNTER — Encounter: Payer: Self-pay | Admitting: Family Medicine

## 2021-08-11 ENCOUNTER — Encounter: Payer: Self-pay | Admitting: Family Medicine

## 2021-08-11 DIAGNOSIS — M5137 Other intervertebral disc degeneration, lumbosacral region: Secondary | ICD-10-CM

## 2021-08-14 MED ORDER — OXYCODONE-ACETAMINOPHEN 5-325 MG PO TABS: 1.0000 | ORAL_TABLET | Freq: Four times a day (QID) | ORAL | 0 refills | Status: DC | PRN

## 2021-08-20 ENCOUNTER — Telehealth: Payer: Self-pay | Admitting: Family Medicine

## 2021-08-20 DIAGNOSIS — E1129 Type 2 diabetes mellitus with other diabetic kidney complication: Secondary | ICD-10-CM

## 2021-08-20 DIAGNOSIS — R809 Proteinuria, unspecified: Secondary | ICD-10-CM

## 2021-08-20 MED ORDER — TRULICITY 1.5 MG/0.5ML ~~LOC~~ SOAJ: SUBCUTANEOUS | 1 refills | Status: DC

## 2021-08-20 NOTE — Telephone Encounter (Signed)
[facility] faxed refill request for the following medications: ° °TRULICITY 1.5 MG/0.5ML SOPN ° ° °Please advise. °

## 2021-08-23 ENCOUNTER — Other Ambulatory Visit: Payer: Self-pay | Admitting: Family Medicine

## 2021-08-23 DIAGNOSIS — G2581 Restless legs syndrome: Secondary | ICD-10-CM

## 2021-09-14 ENCOUNTER — Other Ambulatory Visit: Payer: Self-pay | Admitting: Family Medicine

## 2021-09-14 DIAGNOSIS — M5137 Other intervertebral disc degeneration, lumbosacral region: Secondary | ICD-10-CM

## 2021-09-15 MED ORDER — OXYCODONE-ACETAMINOPHEN 5-325 MG PO TABS: 1.0000 | ORAL_TABLET | Freq: Four times a day (QID) | ORAL | 0 refills | Status: DC | PRN

## 2021-09-29 ENCOUNTER — Encounter: Payer: Self-pay | Admitting: General Surgery

## 2021-09-30 ENCOUNTER — Other Ambulatory Visit (INDEPENDENT_AMBULATORY_CARE_PROVIDER_SITE_OTHER): Payer: Self-pay | Admitting: Nurse Practitioner

## 2021-10-02 NOTE — Telephone Encounter (Signed)
Patient last seen 09/2020 should we refill

## 2021-10-02 NOTE — Telephone Encounter (Signed)
Let's refill but reach out to patient to let her know she needs visit

## 2021-10-03 NOTE — Telephone Encounter (Signed)
I left a message on patient voicemail with Eulogio Ditch NP medical advice and refill had been sent

## 2021-10-09 ENCOUNTER — Encounter (INDEPENDENT_AMBULATORY_CARE_PROVIDER_SITE_OTHER): Payer: 59

## 2021-10-09 ENCOUNTER — Ambulatory Visit (INDEPENDENT_AMBULATORY_CARE_PROVIDER_SITE_OTHER): Payer: 59 | Admitting: Vascular Surgery

## 2021-10-11 ENCOUNTER — Other Ambulatory Visit: Payer: Self-pay | Admitting: Family Medicine

## 2021-10-11 DIAGNOSIS — R809 Proteinuria, unspecified: Secondary | ICD-10-CM

## 2021-10-11 DIAGNOSIS — E1129 Type 2 diabetes mellitus with other diabetic kidney complication: Secondary | ICD-10-CM

## 2021-10-11 NOTE — Telephone Encounter (Signed)
Requested medication (s) are due for refill today: yes  Requested medication (s) are on the active medication list: yes  Last refill:  08/20/21   Future visit scheduled: yes  Notes to clinic:  overdue lab work (last Hgb A1C 03/05/21 9.4%)   Requested Prescriptions  Pending Prescriptions Disp Refills   TRULICITY 1.5 GF/8.4KJ SOPN [Pharmacy Med Name: TRULICITY 1.5MG /0.5ML SDP 0.5ML] 2 mL 1    Sig: ADMINISTER 1.5 MG UNDER THE SKIN 1 TIME A WEEK     Endocrinology:  Diabetes - GLP-1 Receptor Agonists Failed - 10/11/2021  3:13 AM      Failed - HBA1C is between 0 and 7.9 and within 180 days    Hgb A1c MFr Bld  Date Value Ref Range Status  03/05/2021 9.4 (H) 4.8 - 5.6 % Final    Comment:             Prediabetes: 5.7 - 6.4          Diabetes: >6.4          Glycemic control for adults with diabetes: <7.0           Passed - Valid encounter within last 6 months    Recent Outpatient Visits           2 months ago Disorder of peripheral nervous system   [facility], Dionne Bucy, MD   7 months ago Pre-op exam   [name] M, [place]   9 months ago Annual physical exam   [place], [place], [place]   1 year ago Type 2 diabetes mellitus with microalbuminuria, without long-term current use of insulin [facility])   [place], Clearnce Sorrel, [place]   1 year ago Possible urinary tract infection   [facility] [place], Clearnce Sorrel, [place]       Future Appointments             In 1 week [name], [place], [place]

## 2021-10-20 ENCOUNTER — Ambulatory Visit: Payer: 59 | Admitting: Family Medicine

## 2021-10-22 ENCOUNTER — Other Ambulatory Visit: Payer: Self-pay

## 2021-10-22 ENCOUNTER — Encounter: Payer: Self-pay | Admitting: Family Medicine

## 2021-10-22 ENCOUNTER — Ambulatory Visit (INDEPENDENT_AMBULATORY_CARE_PROVIDER_SITE_OTHER): Payer: 59 | Admitting: Family Medicine

## 2021-10-22 VITALS — BP 136/81 | HR 91 | Resp 16 | Wt 225.8 lb

## 2021-10-22 DIAGNOSIS — Z23 Encounter for immunization: Secondary | ICD-10-CM | POA: Diagnosis not present

## 2021-10-22 DIAGNOSIS — R809 Proteinuria, unspecified: Secondary | ICD-10-CM | POA: Insufficient documentation

## 2021-10-22 DIAGNOSIS — I10 Essential (primary) hypertension: Secondary | ICD-10-CM | POA: Diagnosis not present

## 2021-10-22 DIAGNOSIS — E1129 Type 2 diabetes mellitus with other diabetic kidney complication: Secondary | ICD-10-CM | POA: Insufficient documentation

## 2021-10-22 DIAGNOSIS — M5137 Other intervertebral disc degeneration, lumbosacral region: Secondary | ICD-10-CM | POA: Diagnosis not present

## 2021-10-22 DIAGNOSIS — M62838 Other muscle spasm: Secondary | ICD-10-CM | POA: Diagnosis not present

## 2021-10-22 LAB — POCT GLYCOSYLATED HEMOGLOBIN (HGB A1C): Hemoglobin A1C: 9.2 % — AB (ref 4.0–5.6)

## 2021-10-22 MED ORDER — CYCLOBENZAPRINE HCL 5 MG PO TABS: 5.0000 mg | ORAL_TABLET | Freq: Three times a day (TID) | ORAL | 1 refills | Status: DC | PRN

## 2021-10-22 MED ORDER — OXYCODONE-ACETAMINOPHEN 5-325 MG PO TABS: 1.0000 | ORAL_TABLET | Freq: Four times a day (QID) | ORAL | 0 refills | Status: DC | PRN

## 2021-10-22 NOTE — Assessment & Plan Note (Signed)
A1c remains elevated; continue 3 month follow up Recommend balanced meals/drinks Increasing water Increasing exercise

## 2021-10-22 NOTE — Assessment & Plan Note (Signed)
Chronic, elevated Continue medication Recommend DASH diet Recommend exercise as tolerated

## 2021-10-22 NOTE — Assessment & Plan Note (Signed)
Cramping/spasms in hands- wears copper gloves- continue to use PRNs as indicated

## 2021-10-22 NOTE — Progress Notes (Signed)
Established patient visit   Patient: Gabriella Marsh   DOB: April 06, 1966   55 y.o. Female  MRN: [id number] Visit Date: 10/22/2021  Today's healthcare provider: [name], FNP   Chief Complaint  Patient presents with   Diabetes   Gastroesophageal Reflux   Subjective    HPI  Diabetes Mellitus Type II, Follow-up  Lab Results  Component Value Date   HGBA1C 9.2 (A) 10/22/2021   HGBA1C 9.4 (H) 03/05/2021   HGBA1C 8.9 (H) 01/03/2021   Wt Readings from Last 3 Encounters:  10/22/21 225 lb 12.8 oz (102.4 kg)  07/22/21 225 lb (102.1 kg)  07/17/21 225 lb (102.1 kg)   Last seen for diabetes 3 months ago.  Management since then includes none. She reports excellent compliance with treatment. She is not having side effects.  Symptoms: No fatigue No foot ulcerations  No appetite changes No nausea  Yes paresthesia of the feet  No polydipsia  No polyuria Yes visual disturbances   No vomiting     Home blood sugar records:  not being checked  Episodes of hypoglycemia? No    Current insulin regiment: none Most Recent Eye Exam: >1 year Current exercise: no regular exercise Current diet habits: not asked  Pertinent Labs: Lab Results  Component Value Date   CHOL 150 01/03/2021   HDL 38 (L) 01/03/2021   LDLCALC 64 01/03/2021   TRIG 307 (H) 01/03/2021   CHOLHDL 5.9 (H) 01/23/2019   Lab Results  Component Value Date   NA 136 07/24/2021   K 4.1 07/24/2021   CREATININE 0.52 07/25/2021   GFRNONAA >60 07/25/2021   MICROALBUR 50 10/21/2017   LABMICR 28.4 05/22/2020     ---------------------------------------------------------------------------------------------------  GERD, Follow up:  The patient was last seen for GERD 3 months ago. Changes made since that visit include none.  She reports excellent compliance with treatment. She is not having side effects.   She is NOT experiencing   -----------------------------------------------------------------------------------------   Medications: Outpatient Medications Prior to Visit  Medication Sig   atorvastatin (LIPITOR) 20 MG tablet Take 1 tablet (20 mg total) by mouth daily.   clopidogrel (PLAVIX) 75 MG tablet TAKE 1 TABLET(75 MG) BY MOUTH DAILY   furosemide (LASIX) 20 MG tablet TAKE 2 TABLETS(40 MG) BY MOUTH DAILY AS NEEDED FOR FLUID RETENTION OR SWELLING   gabapentin (NEURONTIN) 100 MG capsule TAKE 2 CAPSULES THREE TIMES A DAY ALONG WITH 600 MG THREE TIMES A DAY FOR A TOTAL DAILY DOSE OF 2400 MG DAILY   gabapentin (NEURONTIN) 600 MG tablet TAKE 1 TABLET BY MOUTH 3  TIMES DAILY   glipiZIDE (GLUCOTROL) 10 MG tablet TAKE ONE-HALF TABLET BY  MOUTH TWICE DAILY BEFORE  MEALS   ibuprofen (ADVIL) 600 MG tablet Take 1 tablet (600 mg total) by mouth every 6 (six) hours as needed for mild pain.   levothyroxine (SYNTHROID) 150 MCG tablet Take 1 tablet (150 mcg total) by mouth daily at 6 (six) AM.   lisinopril (ZESTRIL) 5 MG tablet TAKE 1 TABLET(5 MG) BY MOUTH DAILY   Melatonin 10 MG TABS Take 10 mg by mouth at bedtime.   metFORMIN (GLUCOPHAGE) 500 MG tablet Take 2 tablets (1,000 mg total) by mouth 2 (two) times daily with a meal.   nortriptyline (PAMELOR) 25 MG capsule TAKE 2 CAPSULES BY MOUTH AT BEDTIME   omeprazole (PRILOSEC) 20 MG capsule Take 1 capsule (20 mg total) by mouth daily.   traZODone (DESYREL) 50 MG tablet TAKE 1 TO  2 TABLETS(50 TO 100 MG) BY MOUTH AT BEDTIME AS NEEDED FOR SLEEP   triamterene-hydrochlorothiazide (MAXZIDE-25) 37.5-25 MG tablet TAKE 1 TABLET BY MOUTH  DAILY   TRULICITY 1.5 CB/7.6EG SOPN ADMINISTER 1.5 MG UNDER THE SKIN 1 TIME A WEEK   [DISCONTINUED] oxyCODONE-acetaminophen (PERCOCET/ROXICET) 5-325 MG tablet Take 1 tablet by mouth every 6 (six) hours as needed for severe pain.   [DISCONTINUED] calcitRIOL (ROCALTROL) 0.25 MCG capsule Take 1 capsule (0.25 mcg total) by mouth daily. (Patient not taking: No sig  reported)   [DISCONTINUED] calcium carbonate (OS-CAL - DOSED IN MG OF ELEMENTAL CALCIUM) 1250 (500 Ca) MG tablet Take 2 tablets (1,000 mg of elemental calcium total) by mouth 3 (three) times daily with meals. (Patient not taking: No sig reported)   [DISCONTINUED] cyclobenzaprine (FLEXERIL) 5 MG tablet Take 1 tablet (5 mg total) by mouth 3 (three) times daily as needed for muscle spasms. (Patient not taking: Reported on 10/22/2021)   No facility-administered medications prior to visit.    Review of Systems     Objective    BP 136/81   Pulse 91   Resp 16   Wt 225 lb 12.8 oz (102.4 kg)   SpO2 98%   BMI 41.30 kg/m    Physical Exam Vitals and nursing note reviewed.  Constitutional:      General: She is not in acute distress.    Appearance: Normal appearance. She is obese. She is not ill-appearing, toxic-appearing or diaphoretic.  HENT:     Head: Normocephalic and atraumatic.  Cardiovascular:     Rate and Rhythm: Normal rate and regular rhythm.     Pulses: Normal pulses.     Heart sounds: Normal heart sounds. No murmur heard.   No friction rub. No gallop.  Pulmonary:     Effort: Pulmonary effort is normal. No respiratory distress.     Breath sounds: Normal breath sounds. No stridor. No wheezing, rhonchi or rales.  Chest:     Chest wall: No tenderness.  Abdominal:     General: Bowel sounds are normal.     Palpations: Abdomen is soft.  Musculoskeletal:        General: No swelling, tenderness, deformity or signs of injury. Normal range of motion.     Right lower leg: No edema.     Left lower leg: No edema.  Skin:    General: Skin is warm and dry.     Capillary Refill: Capillary refill takes less than 2 seconds.     Coloration: Skin is not jaundiced or pale.     Findings: No bruising, erythema, lesion or rash.  Neurological:     General: No focal deficit present.     Mental Status: She is alert and oriented to person, place, and time. Mental status is at baseline.      Cranial Nerves: No cranial nerve deficit.     Sensory: No sensory deficit.     Motor: No weakness.     Coordination: Coordination normal.  Psychiatric:        Mood and Affect: Mood normal.        Behavior: Behavior normal.        Thought Content: Thought content normal.        Judgment: Judgment normal.     Results for orders placed or performed in visit on 10/22/21  POCT glycosylated hemoglobin (Hb A1C)  Result Value Ref Range   Hemoglobin A1C 9.2 (A) 4.0 - 5.6 %   HbA1c POC (<> result, manual entry)  HbA1c, POC (prediabetic range)     HbA1c, POC (controlled diabetic range)      Assessment & Plan     Problem List Items Addressed This Visit       Cardiovascular and Mediastinum   Essential hypertension    Chronic, elevated Continue medication Recommend DASH diet Recommend exercise as tolerated         Endocrine   Type 2 diabetes mellitus with microalbuminuria, without long-term current use of insulin (HCC) - Primary    A1c remains elevated; continue 3 month follow up Recommend balanced meals/drinks Increasing water Increasing exercise      Relevant Orders   POCT glycosylated hemoglobin (Hb A1C) (Completed)     Musculoskeletal and Integument   DDD (degenerative disc disease), lumbosacral    Chronic pain; has referral before Thanksgiving      Relevant Medications   cyclobenzaprine (FLEXERIL) 5 MG tablet   oxyCODONE-acetaminophen (PERCOCET/ROXICET) 5-325 MG tablet     Other   Muscle spasm    Cramping/spasms in hands- wears copper gloves- continue to use PRNs as indicated      Relevant Medications   cyclobenzaprine (FLEXERIL) 5 MG tablet    Return in about 3 months (around 01/22/2022) for T2DM management.      Vonna Kotyk, FNP, have reviewed all documentation for this visit. The documentation on 10/22/21 for the exam, diagnosis, procedures, and orders are all accurate and complete.    [name], [place] [phone]  (phone) [phone] (fax)  [place]

## 2021-10-22 NOTE — Assessment & Plan Note (Signed)
Chronic pain; has referral before Thanksgiving

## 2021-11-04 ENCOUNTER — Other Ambulatory Visit: Payer: Self-pay | Admitting: Family Medicine

## 2021-11-06 MED ORDER — METFORMIN HCL 500 MG PO TABS: 1000.0000 mg | ORAL_TABLET | Freq: Two times a day (BID) | ORAL | 1 refills | Status: DC

## 2021-11-12 ENCOUNTER — Other Ambulatory Visit: Payer: Self-pay

## 2021-11-12 ENCOUNTER — Ambulatory Visit: Payer: 59 | Attending: Pain Medicine | Admitting: Pain Medicine

## 2021-11-12 ENCOUNTER — Ambulatory Visit
Admission: RE | Admit: 2021-11-12 | Discharge: 2021-11-12 | Disposition: A | Discharge status: Home / Self Care | Payer: 59 | Source: Ambulatory Visit | Attending: Pain Medicine | Admitting: Pain Medicine

## 2021-11-12 ENCOUNTER — Encounter: Payer: Self-pay | Admitting: Pain Medicine

## 2021-11-12 VITALS — BP 124/76 | HR 92 | Temp 97.2°F | Resp 16 | Ht 70.0 in | Wt 225.0 lb

## 2021-11-12 DIAGNOSIS — Z789 Other specified health status: Secondary | ICD-10-CM | POA: Insufficient documentation

## 2021-11-12 DIAGNOSIS — M25512 Pain in left shoulder: Secondary | ICD-10-CM | POA: Insufficient documentation

## 2021-11-12 DIAGNOSIS — M47816 Spondylosis without myelopathy or radiculopathy, lumbar region: Secondary | ICD-10-CM | POA: Insufficient documentation

## 2021-11-12 DIAGNOSIS — G8929 Other chronic pain: Secondary | ICD-10-CM | POA: Insufficient documentation

## 2021-11-12 DIAGNOSIS — M25562 Pain in left knee: Secondary | ICD-10-CM | POA: Insufficient documentation

## 2021-11-12 DIAGNOSIS — M79641 Pain in right hand: Secondary | ICD-10-CM | POA: Diagnosis not present

## 2021-11-12 DIAGNOSIS — M5137 Other intervertebral disc degeneration, lumbosacral region: Secondary | ICD-10-CM | POA: Insufficient documentation

## 2021-11-12 DIAGNOSIS — M25561 Pain in right knee: Secondary | ICD-10-CM | POA: Insufficient documentation

## 2021-11-12 DIAGNOSIS — G894 Chronic pain syndrome: Secondary | ICD-10-CM | POA: Insufficient documentation

## 2021-11-12 DIAGNOSIS — Z79899 Other long term (current) drug therapy: Secondary | ICD-10-CM | POA: Insufficient documentation

## 2021-11-12 DIAGNOSIS — Z79891 Long term (current) use of opiate analgesic: Secondary | ICD-10-CM

## 2021-11-12 DIAGNOSIS — M545 Low back pain, unspecified: Secondary | ICD-10-CM | POA: Insufficient documentation

## 2021-11-12 DIAGNOSIS — M79671 Pain in right foot: Secondary | ICD-10-CM | POA: Diagnosis not present

## 2021-11-12 DIAGNOSIS — M79642 Pain in left hand: Secondary | ICD-10-CM

## 2021-11-12 DIAGNOSIS — M79672 Pain in left foot: Secondary | ICD-10-CM

## 2021-11-12 DIAGNOSIS — M899 Disorder of bone, unspecified: Secondary | ICD-10-CM | POA: Insufficient documentation

## 2021-11-12 NOTE — Progress Notes (Signed)
Patient: Gabriella Marsh  Service Category: E/M  Provider: Gaspar Cola, MD  DOB: 1966/03/25  DOS: 11/12/2021  Referring Provider: Virginia Crews, MD  MRN: [id number]  Setting: Ambulatory outpatient  PCP: [name], FNP  Type: New Patient  Specialty: Interventional Pain Management    Location: Office  Delivery: Face-to-face     Primary Reason(s) for Visit: Encounter for initial evaluation of one or more chronic problems (new to examiner) potentially causing chronic pain, and posing a threat to normal musculoskeletal function. (Level of risk: High) CC: Foot Pain (Bilateral, peripheral neuropathy.  Right great toe amputated d/t blood clot ), Hand Pain (Bilateral, peripheral neuropathy ), Back Pain (Lumbar midline ), and Shoulder Pain (Left, pain from a fall 10 years ago.  Surgery done for repair)  HPI  Gabriella Marsh is a 55 y.o. year old, female patient, who comes for the first time to our practice referred by Virginia Crews, MD for our initial evaluation of her chronic pain. She has Diabetic neuropathy ([place]); Essential hypertension; Elevated liver function tests; Obesity; GERD (gastroesophageal reflux disease); Multinodular goiter; RLS (restless legs syndrome); Chronic low back pain (2ry area of Pain) (Bilateral) (L>R) w/o sciatica; Leg cramps; Adaptation reaction; DDD (degenerative disc disease), lumbosacral; Hypercholesteremia; Insomnia due to medical condition; Adiposity; Disorder of peripheral nervous system; Snores; Anxiety; Common migraine with intractable migraine; Benign positional vertigo; Schamberg's purpura; Leg swelling; Blue toe syndrome of left lower extremity ([place]); Thyroid nodule; S/P total thyroidectomy; Hypocalcemia; Chronic pain syndrome; Diabetic foot infection ([place]); Depression with anxiety; Sepsis ([place]); COVID-19 virus infection; Muscle spasm; Type 2 diabetes mellitus with microalbuminuria, without long-term current use of insulin ([place]); Pharmacologic therapy;  Disorder of skeletal system; Problems influencing health status; Chronic hand pain (1ry area of Pain) (Bilateral) (L>R); Chronic feet pain (3ry area of Pain) (Bilateral) (L>R); Chronic shoulder pain (4th area of Pain) (Left); Chronic knee pain (5th area of Pain) (Bilateral) (L>R); and Lumbar facet syndrome (Bilateral) on their problem list. Today she comes in for evaluation of her Foot Pain (Bilateral, peripheral neuropathy.  Right great toe amputated d/t blood clot ), Hand Pain (Bilateral, peripheral neuropathy ), Back Pain (Lumbar midline ), and Shoulder Pain (Left, pain from a fall 10 years ago.  Surgery done for repair)  Pain Assessment: Location: Left, Right Foot (see visit info for additional pain sites) Radiating: back into hips and legs, left is worse.  pain in feet moves up the leg to approx the knee.  hand pain culminates in the palm of hand. Onset: More than a month ago Duration: Chronic pain Quality: Discomfort, Constant, Nagging, Other (Comment), Numbness, Tingling, Pins and needles (change in affect.  pain is debilitating and has taken her freedome away) Severity: 6 /10 (subjective, self-reported pain score)  Effect on ADL: gait is very much affected, unable to stand for long periods of time. Timing: Constant Modifying factors: warm water/hot showers.  pain medications work "ok". BP: 124/76  HR: 92  Onset and Duration: Gradual Cause of pain:  Nueropathy Severity: Getting worse, NAS-11 at its worse: 10/10, and NAS-11 on the average: 10/10 Timing: Morning, Afternoon, and Night Aggravating Factors: Climbing, Kneeling, Lifiting, Motion, Prolonged sitting, Prolonged standing, Squatting, Stooping , Walking, Walking uphill, and Walking downhill Alleviating Factors: Bending, Stretching, Hot packs, Sitting, Sleeping, and Warm showers or baths Associated Problems: Day-time cramps, Night-time cramps, Dizziness, Fatigue, Inability to concentrate, Numbness, Spasms, Swelling, Temperature  changes, Tingling, Weakness, Pain that wakes patient up, and Pain that does not allow patient to  sleep Quality of Pain: Aching, Agonizing, Annoying, Disabling, Dreadful, Exhausting, Horrible, Nagging, Sharp, Shooting, Stabbing, and Tingling Previous Examinations or Tests: MRI scan, Nerve block, X-rays, Nerve conduction test, Neurological evaluation, and Neurosurgical evaluation Previous Treatments: Narcotic medications and Trigger point injections  According to the patient the primary area of pain is that of the hands (Bilateral) (L>R).  The patient indicates being right-handed.  According to the patient in the case of the right side she has pain in all the fingers but specially in the middle finger (C7) and ring finger (C8).  She describes the pain as a sharp stabbing sensation in both the hand and the wrist.  She also indicates having subjective weakness of that right hand.  In the case of the left hand the pain pattern and everything else is exactly the same as the right hand, but just not as intense.  The patient denies any surgeries, recent x-rays, or any nerve blocks in the wrists or hands.  She does admit to having had an EMG/PNCV (nerve conduction test) by Dr. Narda Amber, DO ([facility]).  She denies any treatments or physical therapy for this hand pain.  This condition appears to be a peripheral neuropathy of diabetic origin.  NCV & EMG Findings (DOS:02/07/2020): Extensive electrodiagnostic testing of the right upper extremity and additional studies of the left shows:  Bilateral median and radial sensory responses show reduced amplitude; additionally, the left median sensory response is mildly prolonged.  Bilateral ulnar sensory responses are absent. Bilateral median and ulnar motor responses reduced amplitude with slowed conduction velocity along the course of the nerve.  Additionally, bilateral median and left ulnar motor response shows prolonged latencies. Chronic motor  axonal loss changes are seen affecting the intrinsic hand muscles, without accompanied active denervation.   Impression: The electrophysiologic findings are consistent with a chronic sensorimotor polyneuropathy, with axonal and demyelinating features.  Overall, these findings are moderate-to-severe in degree electrically.  The patient's secondary area of pain is that of the lower back (Bilateral) (L>R).  The patient denies any prior surgeries but does admit having had some nerve blocks done by Dr. Nathanial Millman. [place] [phone]) [phone] ([place]).  According to the patient the procedures done by Dr. Aris Lot "did seem to help.  She denies any physical therapy or any recent x-rays.  After reviewing the electronic medical record none of these procedure reports are available for review.  However, during this review, it came to light that the patient has had several procedures done by Dr. Jennings [facility]), in the form of sphenopalatine ganglion blocks.  It would appear that he has done several of these for the patient's intractable chronic migraine headaches without aura and without status migrainosus.  The patient's third area pain is that of her feet (Bilateral) (L>R).  The patient indicates having had surgery on July 24, 2021 where she had her big toe amputated by Dr. Jens Som.  Since then she has been experiencing issues with balance.  The patient denies any nerve blocks, physical therapy, or any other treatments.  She does admit to having had some x-rays taken at the podiatry practice.  The patient describes having numbness in both of her feet but still feeling pins-and-needles this problem apparently started in her feet and has moved onto her hands.  This also appears to be secondary to a peripheral neuropathy associated with her uncontrolled diabetes.  The patient's fourth area pain is that of the shoulder (Left).  She indicates having had surgery 9 years  ago after a fall that she experienced where she injured the area.  She does admit to having had physical therapy which did help and she also indicates having had an injection before the surgery which did not help.  She denies any recent x-rays of the area and she also indicates having had a fall last year and having fractured her left upper extremity.  This seems to have also aggravated the pain in that area.  The patient has been seen Carmelia Bake at the [facility] orthopedic surgery department for this particular problem.  The patient's fifth area pain is that of the knees (Bilateral) (L>R).  The patient denies any prior surgeries, physical therapy, joint injections, nerve blocks, or any recent x-rays.  The patient's sixth area pain is that of the lower extremities (Bilateral) (L>R).  The patient refers that the pain runs through the back of the legs but does not reach her feet.  She indicates that the low back pain is much worse than the lower extremity pain.  This would suggest a lower extremity pain to be referred from the lower back.  Again the patient denies any surgeries, recent x-rays, nerve blocks or joint injections, or any recent physical therapy for the lower extremities.  The patient does admit to having had a nerve conduction test for the lower extremities done approximately 10 years ago.  These are unavailable for evaluation.  Pharmacotherapy: The patient currently takes gabapentin 800 mg p.o. 3 times daily, nortriptyline, as well as hydrocodone/APAP.  She also indicated having taken oxycodone in the past.  She describes at one time taking 1 tablet p.o. 4 times daily (120 tablets/month).  Medical problems include uncontrolled diabetes with elevated hemoglobin A1c.  The patient refers having diabetes for the past 20 years.  Today I took the time to provide the patient with information regarding my pain practice. The patient was informed that my practice is divided into two sections: an  interventional pain management section, as well as a completely separate and distinct medication management section. I explained that I have procedure days for my interventional therapies, and evaluation days for follow-ups and medication management. Because of the amount of documentation required during both, they are kept separated. This means that there is the possibility that she may be scheduled for a procedure on one day, and medication management the next. I have also informed her that because of staffing and facility limitations, I no longer take patients for medication management only. To illustrate the reasons for this, I gave the patient the example of surgeons, and how inappropriate it would be to refer a patient to his/her care, just to write for the post-surgical antibiotics on a surgery done by a different surgeon.   Because interventional pain management is my board-certified specialty, the patient was informed that joining my practice means that they are open to any and all interventional therapies. I made it clear that this does not mean that they will be forced to have any procedures done. What this means is that I believe interventional therapies to be essential part of the diagnosis and proper management of chronic pain conditions. Therefore, patients not interested in these interventional alternatives will be better served under the care of a different practitioner.  The patient was also made aware of my Comprehensive Pain Management Safety Guidelines where by joining my practice, they limit all of their nerve blocks and joint injections to those done by our  practice, for as long as we are retained to manage their care.   Historic Controlled Substance Pharmacotherapy Review  PMP and historical list of controlled substances: Oxycodone/APAP 5/325, 1 tab p.o. 4 times daily (# 50) (last filled on 10/22/2021) Current opioid analgesics: .   Oxycodone/APAP 5/325 1 tab p.o. 4 times  daily MME/day: 30 mg/day   Historical Monitoring: The patient  reports no history of drug use. List of all UDS Test(s): No results found for: MDMA, COCAINSCRNUR, [place], [place], CANNABQUANT, THCU, [place] List of other Serum/Urine Drug Screening Test(s):  No results found for: AMPHSCRSER, BARBSCRSER, BENZOSCRSER, COCAINSCRSER, COCAINSCRNUR, PCPSCRSER, PCPQUANT, THCSCRSER, THCU, CANNABQUANT, OPIATESCRSER, OXYSCRSER, PROPOXSCRSER, ETH Historical Background Evaluation: [place] PMP: PDMP reviewed during this encounter. Online review of the past 87-monthperiod conducted.             PMP NARX Score Report:  Narcotic: 370 Sedative: 160 Stimulant: 000 [place] Department of public safety, offender search: (Editor, commissioningInformation) Non-contributory Risk Assessment Profile: Aberrant behavior: None observed or detected today Risk factors for fatal opioid overdose: None identified today PMP NARX Overdose Risk Score: 090 Fatal overdose hazard ratio (HR): Calculation deferred Non-fatal overdose hazard ratio (HR): Calculation deferred Risk of opioid abuse or dependence: 0.7-3.0% with doses ? 36 MME/day and 6.1-26% with doses ? 120 MME/day. Substance use disorder (SUD) risk level: See below Personal History of Substance Abuse (SUD-Substance use disorder):  Alcohol: Negative  Illegal Drugs: Negative  Rx Drugs: Negative  ORT Risk Level calculation: Low Risk  Opioid Risk Tool - 11/12/21 1254       Family History of Substance Abuse   Alcohol Positive Female    Illegal Drugs Positive Female    Rx Drugs Negative      Personal History of Substance Abuse   Alcohol Negative    Illegal Drugs Negative    Rx Drugs Negative      Psychological Disease   Psychological Disease Negative    Depression Negative      Total Score   Opioid Risk Tool Scoring 3    Opioid Risk Interpretation Low Risk            ORT Scoring interpretation table:  Score <3 = Low Risk for SUD  Score between 4-7 = Moderate Risk for SUD   Score >8 = High Risk for Opioid Abuse   PHQ-2 Depression Scale:  Total score:    PHQ-2 Scoring interpretation table: (Score and probability of major depressive disorder)  Score 0 = No depression  Score 1 = 15.4% Probability  Score 2 = 21.1% Probability  Score 3 = 38.4% Probability  Score 4 = 45.5% Probability  Score 5 = 56.4% Probability  Score 6 = 78.6% Probability   PHQ-9 Depression Scale:  Total score:    PHQ-9 Scoring interpretation table:  Score 0-4 = No depression  Score 5-9 = Mild depression  Score 10-14 = Moderate depression  Score 15-19 = Moderately severe depression  Score 20-27 = Severe depression (2.4 times higher risk of SUD and 2.89 times higher risk of overuse)   Pharmacologic Plan: As per protocol, I have not taken over any controlled substance management, pending the results of ordered tests and/or consults.            Initial impression: Pending review of available data and ordered tests.  Meds   Current Outpatient Medications:    atorvastatin (LIPITOR) 20 MG tablet, Take 1 tablet (20 mg total) by mouth daily., Disp: 90 tablet, Rfl: 3   clopidogrel (  PLAVIX) 75 MG tablet, TAKE 1 TABLET(75 MG) BY MOUTH DAILY, Disp: 30 tablet, Rfl: 6   cyclobenzaprine (FLEXERIL) 5 MG tablet, Take 1 tablet (5 mg total) by mouth 3 (three) times daily as needed for muscle spasms., Disp: 270 tablet, Rfl: 1   furosemide (LASIX) 20 MG tablet, TAKE 2 TABLETS(40 MG) BY MOUTH DAILY AS NEEDED FOR FLUID RETENTION OR SWELLING, Disp: 180 tablet, Rfl: 3   gabapentin (NEURONTIN) 100 MG capsule, TAKE 2 CAPSULES THREE TIMES A DAY ALONG WITH 600 MG THREE TIMES A DAY FOR A TOTAL DAILY DOSE OF 2400 MG DAILY, Disp: 360 capsule, Rfl: 3   gabapentin (NEURONTIN) 600 MG tablet, TAKE 1 TABLET BY MOUTH 3  TIMES DAILY, Disp: 270 tablet, Rfl: 1   glipiZIDE (GLUCOTROL) 10 MG tablet, TAKE ONE-HALF TABLET BY  MOUTH TWICE DAILY BEFORE  MEALS, Disp: 90 tablet, Rfl: 3   levothyroxine (SYNTHROID) 150 MCG tablet, Take  1 tablet (150 mcg total) by mouth daily at 6 (six) AM., Disp: 30 tablet, Rfl: 11   lisinopril (ZESTRIL) 5 MG tablet, TAKE 1 TABLET(5 MG) BY MOUTH DAILY, Disp: 90 tablet, Rfl: 3   Melatonin 10 MG TABS, Take 10 mg by mouth at bedtime., Disp: , Rfl:    metFORMIN (GLUCOPHAGE) 500 MG tablet, Take 2 tablets (1,000 mg total) by mouth 2 (two) times daily with a meal., Disp: 90 tablet, Rfl: 1   nortriptyline (PAMELOR) 25 MG capsule, TAKE 2 CAPSULES BY MOUTH AT BEDTIME, Disp: 180 capsule, Rfl: 1   omeprazole (PRILOSEC) 20 MG capsule, Take 1 capsule (20 mg total) by mouth daily., Disp: 90 capsule, Rfl: 3   traZODone (DESYREL) 50 MG tablet, TAKE 1 TO 2 TABLETS(50 TO 100 MG) BY MOUTH AT BEDTIME AS NEEDED FOR SLEEP, Disp: 180 tablet, Rfl: 1   triamterene-hydrochlorothiazide (MAXZIDE-25) 37.5-25 MG tablet, TAKE 1 TABLET BY MOUTH  DAILY, Disp: 90 tablet, Rfl: 3   TRULICITY 1.5 KJ/1.7HX SOPN, ADMINISTER 1.5 MG UNDER THE SKIN 1 TIME A WEEK, Disp: 2 mL, Rfl: 1   oxyCODONE-acetaminophen (PERCOCET/ROXICET) 5-325 MG tablet, Take 1 tablet by mouth every 6 (six) hours as needed for severe pain., Disp: 50 tablet, Rfl: 0  Imaging Review  Shoulder Imaging: Shoulder-L DG: Results for orders placed during the hospital encounter of 10/12/20 DG Shoulder Left  Narrative CLINICAL DATA:  Pain, fall  EXAM: LEFT SHOULDER - 2+ VIEW  COMPARISON:  None.  FINDINGS: There is a comminuted slightly impacted fracture seen through the surgical neck and greater tuberosity. The humeral head is still well seated within the glenoid. Overlying soft tissue swelling is seen.  IMPRESSION: Comminuted slightly impacted proximal humerus fracture.   Electronically Signed By: Prudencio Pair M.D. On: 10/12/2020 21:36  Thoracic Imaging: Thoracic DG w/swimmers view: Results for orders placed during the hospital encounter of 04/01/20 [facility] Thoracic Spine W/Swimmers  Narrative CLINICAL DATA:  Left rib pain.  EXAM: THORACIC SPINE - 3  VIEWS  COMPARISON:  MRI lumbar spine 06/06/2015. Chest x-ray 11/13/2008.  FINDINGS: Diffuse osteopenia and degenerative change. Mild scoliosis. Mild L1 compression again noted without interim change. No acute bony abnormality identified. Surgical clips right upper quadrant.  IMPRESSION: Diffuse osteopenia degenerative change. Mild scoliosis. Mild L1 compression, no change. No acute abnormality identified.   Electronically Signed By: Marcello Moores  Register On: 04/02/2020 05:46  Foot Imaging: Foot-L DG Complete: Results for orders placed during the hospital encounter of 09/03/20 DG Foot Complete Left  Narrative CLINICAL DATA:  Erythema of the left fourth toe with  swelling. Diabetes.  EXAM: LEFT FOOT - COMPLETE 3+ VIEW  COMPARISON:  11/11/2016  FINDINGS: Diffuse mild bony demineralization. No bony destructive findings characteristic of osteomyelitis. Normal Lisfranc joint alignment. No appreciable fracture.  Soft tissue swelling along the dorsum of the foot distally. This is increased compared to the 11/11/2016 exam.  Small plantar and Achilles calcaneal spurs.  No obvious gas tracking within the soft tissues.  IMPRESSION: 1. Increased soft tissue swelling along the dorsum of the foot distally. No underlying bony destructive findings characteristic of osteomyelitis. 2. Diffuse bony demineralization. 3. Small plantar and Achilles calcaneal spurs.   Electronically Signed By: Van Clines M.D. On: 09/03/2020 19:07  Complexity Note: Imaging results reviewed. Results shared with Gabriella Marsh, using Layman's terms.                        ROS  Cardiovascular: High blood pressure Pulmonary or Respiratory: No reported pulmonary signs or symptoms such as wheezing and difficulty taking a deep full breath (Asthma), difficulty blowing air out (Emphysema), coughing up mucus (Bronchitis), persistent dry cough, or temporary stoppage of breathing during sleep Neurological: No  reported neurological signs or symptoms such as seizures, abnormal skin sensations, urinary and/or fecal incontinence, being born with an abnormal open spine and/or a tethered spinal cord Psychological-Psychiatric: No reported psychological or psychiatric signs or symptoms such as difficulty sleeping, anxiety, depression, delusions or hallucinations (schizophrenial), mood swings (bipolar disorders) or suicidal ideations or attempts Gastrointestinal: No reported gastrointestinal signs or symptoms such as vomiting or evacuating blood, reflux, heartburn, alternating episodes of diarrhea and constipation, inflamed or scarred liver, or pancreas or irrregular and/or infrequent bowel movements Genitourinary: No reported renal or genitourinary signs or symptoms such as difficulty voiding or producing urine, peeing blood, non-functioning kidney, kidney stones, difficulty emptying the bladder, difficulty controlling the flow of urine, or chronic kidney disease Hematological: No reported hematological signs or symptoms such as prolonged bleeding, low or poor functioning platelets, bruising or bleeding easily, hereditary bleeding problems, low energy levels due to low hemoglobin or being anemic Endocrine: High blood sugar requiring insulin (IDDM) Rheumatologic: No reported rheumatological signs and symptoms such as fatigue, joint pain, tenderness, swelling, redness, heat, stiffness, decreased range of motion, with or without associated rash Musculoskeletal: Negative for myasthenia gravis, muscular dystrophy, multiple sclerosis or malignant hyperthermia Work History: Working full time  Allergies  Gabriella Marsh is allergic to celecoxib, pregabalin, buspar [buspirone], and citalopram.  Laboratory Chemistry Profile   Renal Lab Results  Component Value Date   BUN 14 11/12/2021   CREATININE 0.71 11/12/2021   BCR 20 11/12/2021   GFR 111.18 06/05/2015   GFRAA 92 01/03/2021   GFRNONAA >60 07/25/2021   SPECGRAV  1.020 05/22/2020   PHUR 6.0 05/22/2020   PROTEINUR Negative 05/22/2020     Electrolytes Lab Results  Component Value Date   NA 135 11/12/2021   K 4.2 11/12/2021   CL 89 (L) 11/12/2021   CALCIUM 8.2 (L) 11/12/2021   MG 1.8 11/12/2021     Hepatic Lab Results  Component Value Date   AST 23 11/12/2021   ALT 12 07/24/2021   ALBUMIN 4.4 11/12/2021   ALKPHOS 75 11/12/2021   LIPASE 47 05/04/2015     ID Lab Results  Component Value Date   HIV Non Reactive 03/28/2021   SARSCOV2NAA POSITIVE (A) 07/22/2021     Bone Lab Results  Component Value Date   25OHVITD1 WILL FOLLOW 11/12/2021   25OHVITD2 WILL FOLLOW 11/12/2021  25OHVITD3 WILL FOLLOW 11/12/2021     Endocrine Lab Results  Component Value Date   GLUCOSE 203 (H) 11/12/2021   GLUCOSEU 250 (A) 09/30/2016   HGBA1C 9.2 (A) 10/22/2021   TSH 1.750 01/03/2021     Neuropathy Lab Results  Component Value Date   VITAMINB12 300 11/12/2021   HGBA1C 9.2 (A) 10/22/2021   HIV Non Reactive 03/28/2021     CNS No results found for: COLORCSF, APPEARCSF, RBCCOUNTCSF, WBCCSF, POLYSCSF, LYMPHSCSF, EOSCSF, PROTEINCSF, GLUCCSF, JCVIRUS, CSFOLI, IGGCSF, LABACHR, ACETBL, LABACHR, ACETBL   Inflammation (CRP: Acute  ESR: Chronic) Lab Results  Component Value Date   CRP 4 11/12/2021   ESRSEDRATE 39 11/12/2021   LATICACIDVEN 2.4 (HH) 07/22/2021     Rheumatology No results found for: RF, ANA, LABURIC, URICUR, LYMEIGGIGMAB, LYMEABIGMQN, HLAB27   Coagulation Lab Results  Component Value Date   INR 1.0 07/22/2021   LABPROT 13.4 07/22/2021   APTT 30 07/22/2021   PLT 426 (H) 07/24/2021     Cardiovascular Lab Results  Component Value Date   BNP 46.8 07/22/2021   TROPONINI <0.03 05/04/2015   HGB 11.0 (L) 07/24/2021   HCT 33.5 (L) 07/24/2021     Screening Lab Results  Component Value Date   SARSCOV2NAA POSITIVE (A) 07/22/2021   HIV Non Reactive 03/28/2021     Cancer No results found for: CEA, CA125, LABCA2    Allergens No results found for: ALMOND, APPLE, ASPARAGUS, AVOCADO, BANANA, BARLEY, BASIL, BAYLEAF, GREENBEAN, LIMABEAN, WHITEBEAN, BEEFIGE, REDBEET, BLUEBERRY, BROCCOLI, CABBAGE, MELON, CARROT, CASEIN, CASHEWNUT, CAULIFLOWER, CELERY     Note: Lab results reviewed.  [place]  Drug: Gabriella Marsh  reports no history of drug use. Alcohol:  reports no history of alcohol use. Tobacco:  reports that she quit smoking about 9 years ago. Her smoking use included cigarettes. She has never used smokeless tobacco. Medical:  has a past medical history of Arthritis, Blood clotting disorder ([place]), Degenerative disc disease, lumbar, Diabetes mellitus without complication ([place]), GERD (gastroesophageal reflux disease), Headache, Hyperlipidemia, Hypertension, Neuropathy, and Vertigo. Family: family history includes Congestive Heart Failure in her father; Diabetes in her father; Healthy in her brother; Heart disease in her father; Hyperlipidemia in her father; Hypertension in her father; Irritable bowel syndrome in her mother; Osteoporosis in her maternal grandmother and mother.  Past Surgical History:  Procedure Laterality Date   ABDOMINAL HYSTERECTOMY     But still has cervix   AMPUTATION TOE Right 07/24/2021   Procedure: AMPUTATION TOE;  Surgeon: Sharlotte Alamo, DPM;  Location: [facility];  Service: Podiatry;  Laterality: Right;   CESAREAN SECTION     CHOLECYSTECTOMY     left arm frature  10/12/2020   OVARIAN CYST SURGERY     PARATHYROIDECTOMY  03/28/2021   Procedure: PARATHYROIDECTOMY AUTOTRANSPLANT;  Surgeon: Fredirick Maudlin, MD;  Location: [facility];  Service: General;;   SHOULDER SURGERY Left 12/26/014   Dr. Little Ishikawa, [facility]   THYROIDECTOMY N/A 03/28/2021   Procedure: THYROIDECTOMY, total;  Surgeon: Fredirick Maudlin, MD;  Location: [facility] OR[facility];  Service: General;  Laterality: N/A;  Provider requesting 3 hours /180 minutes for procedure   TONSILLECTOMY AND ADENOIDECTOMY     UTERINE FIBROID SURGERY     Active  Ambulatory Problems    Diagnosis Date Noted   Diabetic neuropathy ([place]) 03/06/2015   Essential hypertension 03/06/2015   Elevated liver function tests 03/06/2015   Obesity 03/06/2015   GERD (gastroesophageal reflux disease) 03/06/2015   Multinodular goiter 03/06/2015   RLS (restless legs syndrome) 03/06/2015  Chronic low back pain (2ry area of Pain) (Bilateral) (L>R) w/o sciatica 03/06/2015   Leg cramps 03/06/2015   Adaptation reaction 05/03/2015   DDD (degenerative disc disease), lumbosacral 05/03/2015   Hypercholesteremia 05/03/2015   Insomnia due to medical condition 05/03/2015   Adiposity 05/03/2015   Disorder of peripheral nervous system 05/03/2015   Snores 05/03/2015   Anxiety 05/04/2015   Common migraine with intractable migraine 06/18/2015   Benign positional vertigo 02/03/2016   Schamberg's purpura 10/09/2019   Leg swelling 10/09/2019   Blue toe syndrome of left lower extremity ([place]) 10/01/2020   Thyroid nodule    S/P total thyroidectomy 03/28/2021   Hypocalcemia 07/17/2021   Chronic pain syndrome 07/17/2021   Diabetic foot infection ([place]) 07/22/2021   Depression with anxiety 07/22/2021   Sepsis ([place]) 07/22/2021   COVID-19 virus infection 07/22/2021   Muscle spasm 10/22/2021   Type 2 diabetes mellitus with microalbuminuria, without long-term current use of insulin ([place]) 10/22/2021   Pharmacologic therapy 11/12/2021   Disorder of skeletal system 11/12/2021   Problems influencing health status 11/12/2021   Chronic hand pain (1ry area of Pain) (Bilateral) (L>R) 11/12/2021   Chronic feet pain (3ry area of Pain) (Bilateral) (L>R) 11/12/2021   Chronic shoulder pain (4th area of Pain) (Left) 11/12/2021   Chronic knee pain (5th area of Pain) (Bilateral) (L>R) 11/12/2021   Lumbar facet syndrome (Bilateral) 11/12/2021   Resolved Ambulatory Problems    Diagnosis Date Noted   Hyperlipidemia 03/06/2015   Type 2 diabetes mellitus with diabetic neuropathy, without long-term  current use of insulin ([place]) 05/03/2015   BP (high blood pressure) 05/03/2015   Neuropathy 05/03/2015   Restless leg 05/03/2015   Uncontrolled type 2 diabetes mellitus with peripheral neuropathy 07/22/2015   Depression, major, single episode, moderate ([place]) 01/23/2019   Cystitis 05/16/2020   Type 2 diabetes mellitus with microalbuminuria, without long-term current use of insulin ([place]) 05/23/2020   Past Medical History:  Diagnosis Date   Arthritis    Blood clotting disorder ([place])    Degenerative disc disease, lumbar    Diabetes mellitus without complication ([place])    Headache    Hypertension    Vertigo    Constitutional Exam  General appearance: Well nourished, well developed, and well hydrated. In no apparent acute distress Vitals:   11/12/21 1248  BP: 124/76  Pulse: 92  Resp: 16  Temp: (!) 97.2 F (36.2 C)  TempSrc: Temporal  SpO2: 98%  Weight: 225 lb (102.1 kg)  Height: _0  (1.778 m)   BMI Assessment: Estimated body mass index is 32.28 kg/m as calculated from the following:   Height as of this encounter: _1  (1.778 m).   Weight as of this encounter: 225 lb (102.1 kg).  BMI interpretation table: BMI level Category Range association with higher incidence of chronic pain  <18 kg/m2 Underweight   18.5-24.9 kg/m2 Ideal body weight   25-29.9 kg/m2 Overweight Increased incidence by 20%  30-34.9 kg/m2 Obese (Class I) Increased incidence by 68%  35-39.9 kg/m2 Severe obesity (Class II) Increased incidence by 136%  >40 kg/m2 Extreme obesity (Class III) Increased incidence by 254%   Patient's current BMI Ideal Body weight  Body mass index is 32.28 kg/m. Ideal body weight: 68.5 kg (151 lb 0.2 oz) Adjusted ideal body weight: 81.9 kg (180 lb 9.7 oz)   BMI Readings from Last 4 Encounters:  11/12/21 32.28 kg/m  10/22/21 41.30 kg/m  07/22/21 41.15 kg/m  07/17/21 32.28 kg/m   Wt Readings from Last 4  Encounters:  11/12/21 225 lb (102.1 kg)  10/22/21 225 lb 12.8 oz  (102.4 kg)  07/22/21 225 lb (102.1 kg)  07/17/21 225 lb (102.1 kg)    Psych/Mental status: Alert, oriented x 3 (person, place, & time)       Eyes: PERLA Respiratory: No evidence of acute respiratory distress  Assessment  Primary Diagnosis & Pertinent Problem List: The primary encounter diagnosis was Chronic hand pain (1ry area of Pain) (Bilateral) (L>R). Diagnoses of Chronic feet pain (3ry area of Pain) (Bilateral) (L>R), Chronic shoulder pain (4th area of Pain) (Left), Chronic knee pain (5th area of Pain) (Bilateral) (L>R), Lumbar facet syndrome (Bilateral), DDD (degenerative disc disease), lumbosacral, Chronic low back pain (2ry area of Pain) (Bilateral) (L>R) w/o sciatica, Chronic pain syndrome, Pharmacologic therapy, Disorder of skeletal system, Problems influencing health status, Chronic use of opiate for therapeutic purpose, and Encounter for chronic pain management were also pertinent to this visit.  Visit Diagnosis (New problems to examiner): 1. Chronic hand pain (1ry area of Pain) (Bilateral) (L>R)   2. Chronic feet pain (3ry area of Pain) (Bilateral) (L>R)   3. Chronic shoulder pain (4th area of Pain) (Left)   4. Chronic knee pain (5th area of Pain) (Bilateral) (L>R)   5. Lumbar facet syndrome (Bilateral)   6. DDD (degenerative disc disease), lumbosacral   7. Chronic low back pain (2ry area of Pain) (Bilateral) (L>R) w/o sciatica   8. Chronic pain syndrome   9. Pharmacologic therapy   10. Disorder of skeletal system   11. Problems influencing health status   12. Chronic use of opiate for therapeutic purpose   13. Encounter for chronic pain management    Plan of Care (Initial workup plan)  Note: Gabriella Marsh was reminded that as per protocol, today's visit has been an evaluation only. We have not taken over the patient's controlled substance management.  Problem-specific plan: No problem-specific Assessment & Plan notes found for this encounter.  Lab Orders          Compliance Drug Analysis, Ur         Comp. Metabolic Panel (12)         Magnesium         Vitamin B12         Sedimentation rate         25-Hydroxy vitamin D Lcms D2+D3         C-reactive protein     Imaging Orders         DG Lumbar Spine Complete W/Bend         DG Knee Complete 4 Views Right         DG Knee Complete 4 Views Left         DG Shoulder Left     Referral Orders  No referral(s) requested today   Procedure Orders    No procedure(s) ordered today   Pharmacotherapy (current): Medications ordered:  No orders of the defined types were placed in this encounter.  Medications administered during this visit: Gabriella Marsh had no medications administered during this visit.   Pharmacological management options:  Opioid Analgesics: The patient was informed that there is no guarantee that she would be a candidate for opioid analgesics. The decision will be made following CDC guidelines. This decision will be based on the results of diagnostic studies, as well as Gabriella Marsh's risk profile.   Membrane stabilizer: To be determined at a later time  Muscle relaxant: To be determined at a later  time  NSAID: To be determined at a later time  Other analgesic(s): To be determined at a later time   Interventional management options: Gabriella Marsh was informed that there is no guarantee that she would be a candidate for interventional therapies. The decision will be based on the results of diagnostic studies, as well as Gabriella Marsh's risk profile.  Procedure(s) under consideration:  Pending results of ordered studies      Interventional Therapies  Risk  Complexity Considerations:   Estimated body mass index is 32.28 kg/m as calculated from the following:   Height as of this encounter: _0  (1.778 m).   Weight as of this encounter: 225 lb (102.1 kg). WNL   Planned  Pending:   Pending further evaluation   Under consideration:   Qutenza treatments   Completed:   None at  this time   Therapeutic  Palliative (PRN) options:   None established    Provider-requested follow-up: Return for (11mn), Eval-day (M,W), (F2F), 2nd Visit, for review of ordered tests.  Future Appointments  Date Time Provider [place] 01/12/2022  9:00 AM NMilinda Pointer MD [facility] None  01/26/2022  4:00 PM PGwyneth Sprout FNP [facility]    Note by: FGaspar Cola MD Date: 11/12/2021; Time: 11:19 AM

## 2021-11-12 NOTE — Progress Notes (Signed)
Nursing Pain Medication Assessment:  Safety precautions to be maintained throughout the outpatient stay will include: orient to surroundings, keep bed in low position, maintain call bell within reach at all times, provide assistance with transfer out of bed and ambulation.  Medication Inspection Compliance: Pill count conducted under aseptic conditions, in front of the patient. Neither the pills nor the bottle was removed from the patient's sight at any time. Once count was completed pills were immediately returned to the patient in their original bottle.  Medication: Oxycodone/APAP Pill/Patch Count:  22 of 50 pills remain Pill/Patch Appearance: Markings consistent with prescribed medication Bottle Appearance: Standard pharmacy container. Clearly labeled. Filled Date: 65 / 02 / 2022 Last Medication intake:  Today

## 2021-11-12 NOTE — Patient Instructions (Signed)
Capsaicin topical patch What is this medication? CAPSAICIN (cap SAY sin) is a pain reliever. It is used to treat postherpetic neuralgia (PHN) or diabetic neuropathy pain. This medicine may be used for other purposes; ask your health care provider or pharmacist if you have questions. COMMON BRAND NAME(S): Qutenza What should I tell my care team before I take this medication? They need to know if you have any of these conditions: broken or irritated skin high blood pressure history of heart attack or stroke an unusual or allergic reaction to capsaicin, hot peppers, other medicines, foods, dyes, or preservatives pregnant or trying to get pregnant breast-feeding How should I use this medication? This medicine is for external use only. It is applied by a health care professional in a hospital or clinic setting. Talk to your pediatrician regarding the use of this medicine in children. Special care may be needed. Overdosage: If you think you have taken too much of this medicine contact a poison control center or emergency room at once. NOTE: This medicine is only for you. Do not share this medicine with others. What if I miss a dose? This does not apply. What may interact with this medication? Interactions are not expected. Do not use any other skin products on the affected area without asking your doctor or health care professional. This list may not describe all possible interactions. Give your health care provider a list of all the medicines, herbs, non-prescription drugs, or dietary supplements you use. Also tell them if you smoke, drink alcohol, or use illegal drugs. Some items may interact with your medicine. What should I watch for while using this medication? Your condition will be monitored carefully while you are receiving this medicine. Your blood pressure may go up during the procedure. Do not touch the drug patch during treatment. This medicine causes red, burning skin. You may need  pain medicine for during and after the procedure. This medicine can make you more sensitive to heat for a few days after treatment. Be careful in hot showers or baths. Keep out of the sun. Exercise may make the treated skin feel hotter. Tell your doctor or healthcare professional if your symptoms do not start to get better or if they get worse. What side effects may I notice from receiving this medication? Side effects that you should report to your doctor or health care professional as soon as possible: allergic reactions like skin rash, itching or hives, swelling of the face, lips, or tongue burning pain, redness that does not go away changes in blood pressure cough or trouble breathing eye irritation skin sores or thinning Side effects that usually do not require medical attention (report to your doctor or health care professional if they continue or are bothersome): bruising dry skin nausea unusual body smell This list may not describe all possible side effects. Call your doctor for medical advice about side effects. You may report side effects to FDA at 1-800-FDA-1088. Where should I keep my medication? This drug is given in a hospital or clinic and will not be stored at home. NOTE: This sheet is a summary. It may not cover all possible information. If you have questions about this medicine, talk to your doctor, pharmacist, or health care provider.  2022 Elsevier/Gold Standard (2019-07-25 00:00:00)

## 2021-11-14 ENCOUNTER — Encounter: Payer: Self-pay | Admitting: Family Medicine

## 2021-11-14 DIAGNOSIS — M5137 Other intervertebral disc degeneration, lumbosacral region: Secondary | ICD-10-CM

## 2021-11-17 MED ORDER — OXYCODONE-ACETAMINOPHEN 5-325 MG PO TABS: 1.0000 | ORAL_TABLET | Freq: Four times a day (QID) | ORAL | 0 refills | Status: DC | PRN

## 2021-11-21 LAB — COMPLIANCE DRUG ANALYSIS, UR

## 2021-11-23 ENCOUNTER — Encounter: Payer: Self-pay | Admitting: Family Medicine

## 2021-11-23 DIAGNOSIS — M7989 Other specified soft tissue disorders: Secondary | ICD-10-CM

## 2021-11-24 ENCOUNTER — Other Ambulatory Visit: Payer: Self-pay | Admitting: Family Medicine

## 2021-11-24 ENCOUNTER — Other Ambulatory Visit: Payer: Self-pay

## 2021-11-24 LAB — 25-HYDROXY VITAMIN D LCMS D2+D3
25-Hydroxy, Vitamin D-2: <1 ng/mL
25-Hydroxy, Vitamin D-3: 13 ng/mL
25-Hydroxy, Vitamin D: 14 ng/mL — ABNORMAL LOW

## 2021-11-24 LAB — COMP. METABOLIC PANEL (12)
AST: 23 IU/L (ref 0–40)
Albumin/Globulin Ratio: 1.4 (ref 1.2–2.2)
Albumin: 4.4 g/dL (ref 3.8–4.9)
Alkaline Phosphatase: 75 IU/L (ref 44–121)
BUN/Creatinine Ratio: 20 (ref 9–23)
BUN: 14 mg/dL (ref 6–24)
Bilirubin Total: 0.9 mg/dL (ref 0.0–1.2)
Calcium: 8.2 mg/dL — ABNORMAL LOW (ref 8.7–10.2)
Chloride: 89 mmol/L — ABNORMAL LOW (ref 96–106)
Creatinine, Ser: 0.71 mg/dL (ref 0.57–1.00)
Globulin, Total: 3.1 g/dL (ref 1.5–4.5)
Glucose: 203 mg/dL — ABNORMAL HIGH (ref 70–99)
Potassium: 4.2 mmol/L (ref 3.5–5.2)
Sodium: 135 mmol/L (ref 134–144)
Total Protein: 7.5 g/dL (ref 6.0–8.5)
eGFR: 101 mL/min/{1.73_m2} (ref >59)

## 2021-11-24 LAB — MAGNESIUM: Magnesium: 1.8 mg/dL (ref 1.6–2.3)

## 2021-11-24 LAB — SEDIMENTATION RATE: Sed Rate: 39 mm/hr (ref 0–40)

## 2021-11-24 LAB — C-REACTIVE PROTEIN: CRP: 4 mg/L (ref 0–10)

## 2021-11-24 LAB — VITAMIN B12: Vitamin B-12: 300 pg/mL (ref 232–1245)

## 2021-11-24 MED ORDER — METFORMIN HCL 500 MG PO TABS
1000.0000 mg | ORAL_TABLET | Freq: Two times a day (BID) | ORAL | 1 refills | Status: DC
Start: 2021-11-24 — End: 2021-12-16

## 2021-11-24 NOTE — Telephone Encounter (Signed)
Requested Prescriptions  Refused Prescriptions Disp Refills  . metFORMIN (GLUCOPHAGE) 500 MG tablet [Pharmacy Med Name: METFORMIN 500MG TABLETS] 368 tablet     Sig: TAKE 2 TABLETS(1000 MG) BY MOUTH TWICE DAILY WITH A MEAL     Endocrinology:  Diabetes - Biguanides Failed - 11/24/2021  1:48 PM      Failed - HBA1C is between 0 and 7.9 and within 180 days    Hemoglobin A1C  Date Value Ref Range Status  10/22/2021 9.2 (A) 4.0 - 5.6 % Final   Hgb A1c MFr Bld  Date Value Ref Range Status  03/05/2021 9.4 (H) 4.8 - 5.6 % Final    Comment:             Prediabetes: 5.7 - 6.4          Diabetes: >6.4          Glycemic control for adults with diabetes: <7.0          Passed - Cr in normal range and within 360 days    Creatinine, Ser  Date Value Ref Range Status  11/12/2021 0.71 0.57 - 1.00 mg/dL Final   Creatinine,U  Date Value Ref Range Status  06/05/2015 194.5 mg/dL Final         Passed - eGFR in normal range and within 360 days    GFR calc Af Amer  Date Value Ref Range Status  01/03/2021 92 >59 mL/min/1.73 Final    Comment:    **In accordance with recommendations from the NKF-ASN Task force,**   [facility] is in the process of updating its eGFR calculation to the   2021 CKD-EPI creatinine equation that estimates kidney function   without a race variable.    GFR, Estimated  Date Value Ref Range Status  07/25/2021 >60 >60 mL/min Final    Comment:    (NOTE) Calculated using the CKD-EPI Creatinine Equation (2021) Performed at [facility], [address] [place], [place] [address]    GFR  Date Value Ref Range Status  06/05/2015 111.18 >60.00 mL/min Final   eGFR  Date Value Ref Range Status  11/12/2021 101 >59 mL/min/1.73 Final         Passed - Valid encounter within last 6 months    Recent Outpatient Visits          1 month ago Type 2 diabetes mellitus with microalbuminuria, without long-term current use of insulin [facility])   [facility] Tally Joe T, FNP   4 months ago Disorder of peripheral nervous system   [facility], Dionne Bucy, MD   8 months ago Pre-op exam   [facility] [name] M, [place]   10 months ago Annual physical exam   [place], [place], [place]   1 year ago Type 2 diabetes mellitus with microalbuminuria, without long-term current use of insulin [facility])   [facility], Clearnce Sorrel, [place]      Future Appointments            In 2 months Rollene Rotunda, Jaci Standard, [place], [place]

## 2021-11-25 MED ORDER — FUROSEMIDE 20 MG PO TABS
ORAL_TABLET | ORAL | 3 refills | Status: DC
Start: 2021-11-25 — End: 2023-01-09

## 2021-12-07 ENCOUNTER — Encounter: Payer: Self-pay | Admitting: Family Medicine

## 2021-12-08 ENCOUNTER — Other Ambulatory Visit: Payer: Self-pay | Admitting: Family Medicine

## 2021-12-08 DIAGNOSIS — M5137 Other intervertebral disc degeneration, lumbosacral region: Secondary | ICD-10-CM

## 2021-12-08 DIAGNOSIS — F5101 Primary insomnia: Secondary | ICD-10-CM

## 2021-12-08 MED ORDER — TRAZODONE HCL 50 MG PO TABS: 100.0000 mg | ORAL_TABLET | Freq: Every day | ORAL | 1 refills | Status: DC

## 2021-12-08 MED ORDER — OXYCODONE-ACETAMINOPHEN 5-325 MG PO TABS: 1.0000 | ORAL_TABLET | Freq: Three times a day (TID) | ORAL | 0 refills | Status: DC | PRN

## 2021-12-09 ENCOUNTER — Other Ambulatory Visit: Payer: Self-pay | Admitting: Family Medicine

## 2021-12-09 DIAGNOSIS — M5137 Other intervertebral disc degeneration, lumbosacral region: Secondary | ICD-10-CM

## 2021-12-09 NOTE — Telephone Encounter (Signed)
Medication Refill - Medication: oxyCODONE-acetaminophen (PERCOCET/ROXICET) 5-325 MG tablet  Has the patient contacted their pharmacy? yes (Agent: If no, request that the patient contact the pharmacy for the refill. If patient does not wish to contact the pharmacy document the reason why and proceed with request.) (Agent: If yes, when and what did the pharmacy advise?)contact pcp  Preferred Pharmacy (with phone number or street name): [facility] [place], [address] AT [address] Phone:  [phone]  Fax:  [phone]     Has the patient been seen for an appointment in the last year OR does the patient have an upcoming appointment? yes  Agent: Please be advised that RX refills may take up to 3 business days. We ask that you follow-up with your pharmacy.

## 2021-12-10 ENCOUNTER — Ambulatory Visit: Payer: Self-pay

## 2021-12-10 ENCOUNTER — Telehealth: Payer: Self-pay | Admitting: Family Medicine

## 2021-12-10 ENCOUNTER — Encounter: Payer: Self-pay | Admitting: Family Medicine

## 2021-12-10 NOTE — Telephone Encounter (Signed)
Waiting on fax, please review request below. KW

## 2021-12-10 NOTE — Telephone Encounter (Signed)
Requested medications are due for refill today.  no  Requested medications are on the active medications list.  yes  Last refill. 12/08/2021  Future visit scheduled.   yes  Notes to clinic.  Medication not delegated. Unsure if Pharmacy received rx from 12/09/2021.    Requested Prescriptions  Pending Prescriptions Disp Refills   oxyCODONE-acetaminophen (PERCOCET/ROXICET) 5-325 MG tablet 50 tablet 0    Sig: Take 1 tablet by mouth every 8 (eight) hours as needed for severe pain.     Not Delegated - Analgesics:  Opioid Agonist Combinations Failed - 12/09/2021  2:54 PM      Failed - This refill cannot be delegated      Failed - Urine Drug Screen completed in last 360 days      Passed - Valid encounter within last 6 months    Recent Outpatient Visits           1 month ago Type 2 diabetes mellitus with microalbuminuria, without long-term current use of insulin [facility])   [facility] Tally Joe T, FNP   4 months ago Disorder of peripheral nervous system   [facility] [place], Dionne Bucy, [place]   9 months ago Pre-op exam   [facility] [name] M, [place]   11 months ago Annual physical exam   [place], [place], [place]   1 year ago Type 2 diabetes mellitus with microalbuminuria, without long-term current use of insulin [facility])   [facility] [place], Clearnce Sorrel, [place]       Future Appointments             In 1 month Rollene Rotunda, Jaci Standard, [place], [place]

## 2021-12-10 NOTE — Telephone Encounter (Signed)
Summary: Prior authorization medication   Patient called in to inform Tally Joe that the [facility] prior authorization dept is sending a form for  oxyCODONE-acetaminophen (PERCOCET/ROXICET) 5-325 MG tablet with  Ref# [id number] Ph# [phone] Prior authorization dept,Per patient she will be leaving to go out of town tomorrow morning and need Rx right away please patient asking for a call back Ph# [phone]

## 2021-12-10 NOTE — Telephone Encounter (Signed)
This encounter was created in error - please disregard.

## 2021-12-10 NOTE — Addendum Note (Signed)
Addended by: Matilde Sprang on: 12/10/2021 03:29 PM   Modules accepted: Orders, Level of Service

## 2021-12-11 ENCOUNTER — Encounter: Payer: Self-pay | Admitting: Family Medicine

## 2021-12-14 ENCOUNTER — Other Ambulatory Visit: Payer: Self-pay | Admitting: Family Medicine

## 2021-12-14 DIAGNOSIS — E1129 Type 2 diabetes mellitus with other diabetic kidney complication: Secondary | ICD-10-CM

## 2021-12-16 ENCOUNTER — Other Ambulatory Visit: Payer: Self-pay | Admitting: Family Medicine

## 2021-12-16 NOTE — Telephone Encounter (Signed)
Requested Prescriptions  Pending Prescriptions Disp Refills   TRULICITY 1.5 FG/9.0SX SOPN [Pharmacy Med Name: TRULICITY 1.5MG /0.5ML SDP 0.5ML] 2 mL 1    Sig: ADMINISTER 1.5 MG UNDER THE SKIN 1 TIME A WEEK     Endocrinology:  Diabetes - GLP-1 Receptor Agonists Failed - 12/14/2021  3:12 AM      Failed - HBA1C is between 0 and 7.9 and within 180 days    Hemoglobin A1C  Date Value Ref Range Status  10/22/2021 9.2 (A) 4.0 - 5.6 % Final   Hgb A1c MFr Bld  Date Value Ref Range Status  03/05/2021 9.4 (H) 4.8 - 5.6 % Final    Comment:             Prediabetes: 5.7 - 6.4          Diabetes: >6.4          Glycemic control for adults with diabetes: <7.0          Passed - Valid encounter within last 6 months    Recent Outpatient Visits          1 month ago Type 2 diabetes mellitus with microalbuminuria, without long-term current use of insulin [facility])   [facility] Tally Joe T, FNP   5 months ago Disorder of peripheral nervous system   [facility] [place], Dionne Bucy, MD   9 months ago Pre-op exam   [facility] [name] M, [place]   11 months ago Annual physical exam   [place], [place], [place]   1 year ago Type 2 diabetes mellitus with microalbuminuria, without long-term current use of insulin [facility])   [facility] [place], Clearnce Sorrel, [place]      Future Appointments            In 1 month Rollene Rotunda, Jaci Standard, [place], [place]

## 2021-12-20 ENCOUNTER — Other Ambulatory Visit: Payer: Self-pay | Admitting: Physician Assistant

## 2021-12-20 ENCOUNTER — Other Ambulatory Visit: Payer: Self-pay | Admitting: Family Medicine

## 2021-12-20 DIAGNOSIS — E1140 Type 2 diabetes mellitus with diabetic neuropathy, unspecified: Secondary | ICD-10-CM

## 2021-12-20 NOTE — Telephone Encounter (Signed)
Requested Prescriptions  Pending Prescriptions Disp Refills   gabapentin (NEURONTIN) 600 MG tablet [Pharmacy Med Name: Gabapentin 600 MG Oral Tablet] 270 tablet 1    Sig: TAKE 1 TABLET BY MOUTH 3  TIMES DAILY     Neurology: Anticonvulsants - gabapentin Passed - 12/20/2021  4:24 AM      Passed - Valid encounter within last 12 months    Recent Outpatient Visits          1 month ago Type 2 diabetes mellitus with microalbuminuria, without long-term current use of insulin [facility])   [facility] Tally Joe T, FNP   5 months ago Disorder of peripheral nervous system   [facility] [place], Dionne Bucy, [place]   9 months ago Pre-op exam   [facility] [name] M, [place]   11 months ago Annual physical exam   [place], [place], [place]   1 year ago Type 2 diabetes mellitus with microalbuminuria, without long-term current use of insulin [facility])   [facility] [place], Clearnce Sorrel, [place]      Future Appointments            In 1 month Rollene Rotunda, Jaci Standard, [place], [place]

## 2021-12-29 ENCOUNTER — Ambulatory Visit
Admission: EM | Admit: 2021-12-29 | Discharge: 2021-12-29 | Disposition: A | Discharge status: Home / Self Care | Payer: BLUE CROSS/BLUE SHIELD | Attending: Emergency Medicine | Admitting: Emergency Medicine

## 2021-12-29 ENCOUNTER — Other Ambulatory Visit: Payer: Self-pay

## 2021-12-29 DIAGNOSIS — R059 Cough, unspecified: Secondary | ICD-10-CM | POA: Insufficient documentation

## 2021-12-29 DIAGNOSIS — R519 Headache, unspecified: Secondary | ICD-10-CM | POA: Insufficient documentation

## 2021-12-29 DIAGNOSIS — I10 Essential (primary) hypertension: Secondary | ICD-10-CM | POA: Diagnosis not present

## 2021-12-29 DIAGNOSIS — Z87891 Personal history of nicotine dependence: Secondary | ICD-10-CM | POA: Diagnosis not present

## 2021-12-29 DIAGNOSIS — R197 Diarrhea, unspecified: Secondary | ICD-10-CM | POA: Insufficient documentation

## 2021-12-29 DIAGNOSIS — K219 Gastro-esophageal reflux disease without esophagitis: Secondary | ICD-10-CM | POA: Diagnosis not present

## 2021-12-29 DIAGNOSIS — J069 Acute upper respiratory infection, unspecified: Secondary | ICD-10-CM | POA: Insufficient documentation

## 2021-12-29 DIAGNOSIS — E785 Hyperlipidemia, unspecified: Secondary | ICD-10-CM | POA: Diagnosis not present

## 2021-12-29 DIAGNOSIS — Z20822 Contact with and (suspected) exposure to covid-19: Secondary | ICD-10-CM | POA: Diagnosis not present

## 2021-12-29 DIAGNOSIS — M1990 Unspecified osteoarthritis, unspecified site: Secondary | ICD-10-CM | POA: Insufficient documentation

## 2021-12-29 DIAGNOSIS — R0981 Nasal congestion: Secondary | ICD-10-CM | POA: Insufficient documentation

## 2021-12-29 DIAGNOSIS — Z8616 Personal history of COVID-19: Secondary | ICD-10-CM | POA: Diagnosis not present

## 2021-12-29 DIAGNOSIS — J029 Acute pharyngitis, unspecified: Secondary | ICD-10-CM | POA: Diagnosis not present

## 2021-12-29 DIAGNOSIS — E119 Type 2 diabetes mellitus without complications: Secondary | ICD-10-CM | POA: Diagnosis not present

## 2021-12-29 LAB — RESP PANEL BY RT-PCR (FLU A&B, COVID) ARPGX2
Influenza A by PCR: NEGATIVE
Influenza B by PCR: NEGATIVE
SARS Coronavirus 2 by RT PCR: NEGATIVE

## 2021-12-29 NOTE — ED Triage Notes (Signed)
Patient presents to Urgent Care with complaints of dry cough, decreased appetite, cough worse with laying since Saturday. Not treating cough with meds.   Denies fever.

## 2021-12-29 NOTE — Discharge Instructions (Addendum)
Your symptoms today are most likely being caused by a virus and should steadily improve in time it can take up to 7 to 10 days before you truly start to see a turnaround however things will get better  COVID and flu test pending, you will be notified if positive and antiviral medicine sent it for use     You can take Tylenol and/or Ibuprofen as needed for fever reduction and pain relief.   For cough: honey 1/2 to 1 teaspoon (you can dilute the honey in water or another fluid).  You can also use guaifenesin and dextromethorphan for cough. You can use a humidifier for chest congestion and cough.  If you don't have a humidifier, you can sit in the bathroom with the hot shower running.      For sore throat: try warm salt water gargles, cepacol lozenges, throat spray, warm tea or water with lemon/honey, popsicles or ice, or OTC cold relief medicine for throat discomfort.   For congestion: take a daily anti-histamine like Zyrtec, Claritin, and a oral decongestant, such as pseudoephedrine.  You can also use Flonase 1-2 sprays in each nostril daily.   It is important to stay hydrated: drink plenty of fluids (water, gatorade/powerade/pedialyte, juices, or teas) to keep your throat moisturized and help further relieve irritation/discomfort.

## 2021-12-29 NOTE — ED Provider Notes (Signed)
[facility]    CSN: [id number] Arrival date & time: 12/29/21  1049      History   Chief Complaint Chief Complaint  Patient presents with   Cough    HPI Gabriella Marsh is a 56 y.o. female.   Patient presents with chills, nasal congestion, rhinorrhea, sore throat, nonproductive cough, intermittent generalized headaches and diarrhea for 2 days. Known sick contacts. Has attempted use cough drop and humidifier, not helpful.  History of arthritis, diabetes, GERD, hyperlipidemia, hypertension.   Past Medical History:  Diagnosis Date   Arthritis    knees   Blood clotting disorder ([place])    on toe    Degenerative disc disease, lumbar    Diabetes mellitus without complication (HCC)    GERD (gastroesophageal reflux disease)    Headache    daily - AM (has not been able to have SPG blocks lately)   Hyperlipidemia    Hypertension    Neuropathy    feet   Vertigo    3-4x/yr    Patient Active Problem List   Diagnosis Date Noted   Pharmacologic therapy 11/12/2021   Disorder of skeletal system 11/12/2021   Problems influencing health status 11/12/2021   Chronic hand pain (1ry area of Pain) (Bilateral) (L>R) 11/12/2021   Chronic feet pain (3ry area of Pain) (Bilateral) (L>R) 11/12/2021   Chronic shoulder pain (4th area of Pain) (Left) 11/12/2021   Chronic knee pain (5th area of Pain) (Bilateral) (L>R) 11/12/2021   Lumbar facet syndrome (Bilateral) 11/12/2021   Muscle spasm 10/22/2021   Type 2 diabetes mellitus with microalbuminuria, without long-term current use of insulin ([place]) 10/22/2021   Diabetic foot infection ([place]) 07/22/2021   Depression with anxiety 07/22/2021   Sepsis ([place]) 07/22/2021   COVID-19 virus infection 07/22/2021   Hypocalcemia 07/17/2021   Chronic pain syndrome 07/17/2021   S/P total thyroidectomy 03/28/2021   Thyroid nodule    Blue toe syndrome of left lower extremity ([place]) 10/01/2020   Schamberg's purpura 10/09/2019   Leg swelling  10/09/2019   Benign positional vertigo 02/03/2016   Common migraine with intractable migraine 06/18/2015   Anxiety 05/04/2015   Adaptation reaction 05/03/2015   DDD (degenerative disc disease), lumbosacral 05/03/2015   Hypercholesteremia 05/03/2015   Insomnia due to medical condition 05/03/2015   Adiposity 05/03/2015   Disorder of peripheral nervous system 05/03/2015   Snores 05/03/2015   Diabetic neuropathy ([place]) 03/06/2015   Essential hypertension 03/06/2015   Elevated liver function tests 03/06/2015   Obesity 03/06/2015   GERD (gastroesophageal reflux disease) 03/06/2015   Multinodular goiter 03/06/2015   RLS (restless legs syndrome) 03/06/2015   Chronic low back pain (2ry area of Pain) (Bilateral) (L>R) w/o sciatica 03/06/2015   Leg cramps 03/06/2015    Past Surgical History:  Procedure Laterality Date   ABDOMINAL HYSTERECTOMY     But still has cervix   AMPUTATION TOE Right 07/24/2021   Procedure: AMPUTATION TOE;  Surgeon: Sharlotte Alamo, DPM;  Location: [facility];  Service: Podiatry;  Laterality: Right;   CESAREAN SECTION     CHOLECYSTECTOMY     left arm frature  10/12/2020   OVARIAN CYST SURGERY     PARATHYROIDECTOMY  03/28/2021   Procedure: PARATHYROIDECTOMY AUTOTRANSPLANT;  Surgeon: Fredirick Maudlin, MD;  Location: [facility];  Service: General;;   SHOULDER SURGERY Left 12/26/014   Dr. Little Ishikawa, [facility]   THYROIDECTOMY N/A 03/28/2021   Procedure: THYROIDECTOMY, total;  Surgeon: Fredirick Maudlin, MD;  Location: [facility];  Service: General;  Laterality:  N/A;  Provider requesting 3 hours /180 minutes for procedure   TONSILLECTOMY AND ADENOIDECTOMY     UTERINE FIBROID SURGERY      OB History     Gravida  2   Para  1   Term      Preterm      AB  1   Living         SAB  1   IAB      Ectopic      Multiple      Live Births               Home Medications    Prior to Admission medications   Medication Sig Start Date End Date Taking? Authorizing Provider   atorvastatin (LIPITOR) 20 MG tablet Take 1 tablet (20 mg total) by mouth daily. 07/17/21   Virginia Crews, MD  clopidogrel (PLAVIX) 75 MG tablet TAKE 1 TABLET(75 MG) BY MOUTH DAILY 10/03/21   Kris Hartmann, NP  cyclobenzaprine (FLEXERIL) 5 MG tablet Take 1 tablet (5 mg total) by mouth 3 (three) times daily as needed for muscle spasms. 10/22/21   [name], FNP  furosemide (LASIX) 20 MG tablet TAKE 2 TABLETS(40 MG) BY MOUTH DAILY AS NEEDED FOR FLUID RETENTION OR SWELLING 11/25/21   Tally Joe T, FNP  gabapentin (NEURONTIN) 100 MG capsule TAKE 2 CAPSULES THREE TIMES A DAY ALONG WITH 600 MG THREE TIMES A DAY FOR A TOTAL DAILY DOSE OF 2400 MG DAILY 07/17/21   Virginia Crews, MD  gabapentin (NEURONTIN) 600 MG tablet TAKE 1 TABLET BY MOUTH 3  TIMES DAILY 12/20/21   Tally Joe T, FNP  glipiZIDE (GLUCOTROL) 10 MG tablet TAKE ONE-HALF TABLET BY  MOUTH TWICE DAILY BEFORE  MEALS 12/26/21   [name], FNP  levothyroxine (SYNTHROID) 150 MCG tablet Take 1 tablet (150 mcg total) by mouth daily at 6 (six) AM. 03/30/21   Fredirick Maudlin, MD  lisinopril (ZESTRIL) 5 MG tablet TAKE 1 TABLET(5 MG) BY MOUTH DAILY 06/12/21   Birdie Sons, MD  Melatonin 10 MG TABS Take 10 mg by mouth at bedtime.    [provider]  metFORMIN (GLUCOPHAGE) 500 MG tablet Take 2 tablets (1,000 mg total) by mouth 2 (two) times daily with a meal. 12/16/21   Tally Joe T, FNP  nortriptyline (PAMELOR) 25 MG capsule TAKE 2 CAPSULES BY MOUTH AT BEDTIME 08/23/21   [name], FNP  omeprazole (PRILOSEC) 20 MG capsule Take 1 capsule (20 mg total) by mouth daily. 07/17/21   Virginia Crews, MD  oxyCODONE-acetaminophen (PERCOCET/ROXICET) 5-325 MG tablet Take 1 tablet by mouth every 8 (eight) hours as needed for severe pain. 12/08/21   [name], FNP  traZODone (DESYREL) 50 MG tablet Take 2 tablets (100 mg total) by mouth at bedtime. 12/08/21   [name], FNP  triamterene-hydrochlorothiazide (MAXZIDE-25)  37.5-25 MG tablet TAKE 1 TABLET BY MOUTH  DAILY 04/14/21   Virginia Crews, MD  TRULICITY 1.5 WG/9.5AO SOPN ADMINISTER 1.5 MG UNDER THE SKIN 1 TIME A WEEK 12/16/21   [name], FNP    Family History Family History  Problem Relation Age of Onset   Diabetes Father    Heart disease Father    Hypertension Father    Hyperlipidemia Father    Congestive Heart Failure Father    Healthy Brother    Osteoporosis Mother    Irritable bowel syndrome Mother    Osteoporosis Maternal Grandmother  Social History Social History   Tobacco Use   Smoking status: Former    Types: Cigarettes    Quit date: 2013    Years since quitting: 10.0   Smokeless tobacco: Never  Vaping Use   Vaping Use: Never used  Substance Use Topics   Alcohol use: No    Alcohol/week: 0.0 standard drinks   Drug use: Never     Allergies   Celecoxib, Pregabalin, Buspar [buspirone], and Citalopram   Review of Systems Review of Systems  Constitutional:  Positive for chills. Negative for activity change, appetite change, diaphoresis, fatigue, fever and unexpected weight change.  HENT:  Positive for congestion, rhinorrhea and sore throat. Negative for dental problem, drooling, ear discharge, ear pain, facial swelling, hearing loss, mouth sores, nosebleeds, postnasal drip, sinus pressure, sinus pain, sneezing, tinnitus, trouble swallowing and voice change.   Respiratory:  Positive for cough. Negative for apnea, choking, chest tightness, shortness of breath, wheezing and stridor.   Cardiovascular: Negative.   Gastrointestinal:  Positive for diarrhea. Negative for abdominal distention, abdominal pain, anal bleeding, blood in stool, constipation, nausea, rectal pain and vomiting.  Skin: Negative.   Neurological:  Positive for headaches. Negative for dizziness, tremors, seizures, syncope, facial asymmetry, speech difficulty, weakness, light-headedness and numbness.    Physical Exam Triage Vital Signs ED Triage  Vitals  Enc Vitals Group     BP 12/29/21 1124 (!) 119/104     Pulse Rate 12/29/21 1124 94     Resp 12/29/21 1124 16     Temp 12/29/21 1124 97.9 F (36.6 C)     Temp Source 12/29/21 1124 Oral     SpO2 12/29/21 1124 97 %     Weight --      Height --      Head Circumference --      Peak Flow --      Pain Score 12/29/21 1123 0     Pain Loc --      Pain Edu? --      Excl. in [place]? --    No data found.  Updated Vital Signs BP (!) 119/104 (BP Location: Left Arm)    Pulse 94    Temp 97.9 F (36.6 C) (Oral)    Resp 16    SpO2 97%   Visual Acuity Right Eye Distance:   Left Eye Distance:   Bilateral Distance:    Right Eye Near:   Left Eye Near:    Bilateral Near:     Physical Exam Constitutional:      Appearance: Normal appearance.  HENT:     Head: Normocephalic.     Right Ear: Tympanic membrane, ear canal and external ear normal.     Left Ear: Tympanic membrane, ear canal and external ear normal.     Nose: Congestion and rhinorrhea present.     Mouth/Throat:     Mouth: Mucous membranes are moist.     Pharynx: Posterior oropharyngeal erythema present.  Eyes:     Extraocular Movements: Extraocular movements intact.  Cardiovascular:     Rate and Rhythm: Normal rate and regular rhythm.     Pulses: Normal pulses.     Heart sounds: Normal heart sounds.  Pulmonary:     Effort: Pulmonary effort is normal.     Breath sounds: Normal breath sounds.  Musculoskeletal:     Cervical back: Normal range of motion.  Lymphadenopathy:     Cervical: Cervical adenopathy present.  Skin:    General: Skin is warm and dry.  Neurological:     Mental Status: She is alert and oriented to person, place, and time. Mental status is at baseline.  Psychiatric:        Mood and Affect: Mood normal.        Behavior: Behavior normal.     UC Treatments / Results  Labs (all labs ordered are listed, but only abnormal results are displayed) Labs Reviewed - No data to display  EKG   Radiology No  results found.  Procedures Procedures (including critical care time)  Medications Ordered in UC Medications - No data to display  Initial Impression / Assessment and Plan / UC Course  I have reviewed the triage vital signs and the nursing notes.  Pertinent labs & imaging results that were available during my care of the patient were reviewed by me and considered in my medical decision making (see chart for details).  Viral URI with cough  COVID and flu test pending, discussed use of antivirals, eligible for both, patient will begin to use over-the-counter medications for management of symptoms in the meantime, vital signs are stable, patient is in no signs of distress, stable for outpatient treatment, may follow-up with urgent care as needed, work note given  Final Clinical Impressions(s) / UC Diagnoses   Final diagnoses:  None   Discharge Instructions   None    ED Prescriptions   None    PDMP not reviewed this encounter.   Hans Eden, NP 12/29/21 1243

## 2022-01-07 ENCOUNTER — Encounter: Payer: Self-pay | Admitting: Family Medicine

## 2022-01-09 ENCOUNTER — Telehealth (INDEPENDENT_AMBULATORY_CARE_PROVIDER_SITE_OTHER): Payer: 59 | Admitting: Family Medicine

## 2022-01-09 ENCOUNTER — Encounter: Payer: Self-pay | Admitting: Family Medicine

## 2022-01-09 ENCOUNTER — Other Ambulatory Visit: Payer: Self-pay | Admitting: Family Medicine

## 2022-01-09 ENCOUNTER — Other Ambulatory Visit: Payer: Self-pay

## 2022-01-09 VITALS — Temp 98.7°F | Wt 220.0 lb

## 2022-01-09 DIAGNOSIS — J3489 Other specified disorders of nose and nasal sinuses: Secondary | ICD-10-CM | POA: Insufficient documentation

## 2022-01-09 DIAGNOSIS — R051 Acute cough: Secondary | ICD-10-CM | POA: Insufficient documentation

## 2022-01-09 DIAGNOSIS — H6692 Otitis media, unspecified, left ear: Secondary | ICD-10-CM | POA: Insufficient documentation

## 2022-01-09 DIAGNOSIS — M5137 Other intervertebral disc degeneration, lumbosacral region: Secondary | ICD-10-CM

## 2022-01-09 DIAGNOSIS — J0140 Acute pansinusitis, unspecified: Secondary | ICD-10-CM | POA: Insufficient documentation

## 2022-01-09 DIAGNOSIS — R599 Enlarged lymph nodes, unspecified: Secondary | ICD-10-CM | POA: Insufficient documentation

## 2022-01-09 MED ORDER — PREDNISONE 10 MG PO TABS: 10.0000 mg | ORAL_TABLET | Freq: Every day | ORAL | 0 refills | Status: DC

## 2022-01-09 MED ORDER — OXYCODONE-ACETAMINOPHEN 5-325 MG PO TABS: 1.0000 | ORAL_TABLET | Freq: Three times a day (TID) | ORAL | 0 refills | Status: DC | PRN

## 2022-01-09 MED ORDER — FLUTICASONE PROPIONATE 50 MCG/ACT NA SUSP: 2.0000 | Freq: Every day | NASAL | 6 refills | Status: AC

## 2022-01-09 MED ORDER — AMOXICILLIN-POT CLAVULANATE 875-125 MG PO TABS: 1.0000 | ORAL_TABLET | Freq: Two times a day (BID) | ORAL | 0 refills | Status: DC

## 2022-01-09 NOTE — Assessment & Plan Note (Signed)
10 days of symptoms No sick contacts tx with antibiotic

## 2022-01-09 NOTE — Assessment & Plan Note (Signed)
Likely infection given swelling from sinus involvement  Unable to visualize given nature of video visit

## 2022-01-09 NOTE — Assessment & Plan Note (Signed)
Generalized inflammatory response Reassurance provided

## 2022-01-09 NOTE — Assessment & Plan Note (Signed)
L side  Per pt report Steroid burst provided

## 2022-01-09 NOTE — Assessment & Plan Note (Signed)
Likely d/t drainage from sinuses Denies wheezing or rattle

## 2022-01-09 NOTE — Progress Notes (Signed)
MyChart Video Visit    Virtual Visit via Video Note   This visit type was conducted due to national recommendations for restrictions regarding the COVID-19 Pandemic (e.g. social distancing) in an effort to limit this patient's exposure and mitigate transmission in our community. This patient is at least at moderate risk for complications without adequate follow up. This format is felt to be most appropriate for this patient at this time. Physical exam was limited by quality of the video and audio technology used for the visit.   Patient location: home Provider location: [facility]  I discussed the limitations of evaluation and management by telemedicine and the availability of in person appointments. The patient expressed understanding and agreed to proceed.  Patient: Gabriella Marsh   DOB: [birth year]   56 y.o. Female  MRN: [id number] Visit Date: 01/09/2022  Today's healthcare provider: [name], FNP   Chief Complaint  Patient presents with   Otalgia   Subjective    Otalgia  There is pain in the left ear. This is a new problem. The current episode started 1 to 4 weeks ago. The problem occurs constantly. There has been no fever. Associated symptoms include abdominal pain, coughing, diarrhea, headaches, rhinorrhea and a sore throat. Pertinent negatives include no ear discharge, hearing loss, neck pain, rash or vomiting. Treatments tried: vicks formula 44, vicks vapor rub. The treatment provided no relief.      Medications: Outpatient Medications Prior to Visit  Medication Sig   atorvastatin (LIPITOR) 20 MG tablet Take 1 tablet (20 mg total) by mouth daily.   clopidogrel (PLAVIX) 75 MG tablet TAKE 1 TABLET(75 MG) BY MOUTH DAILY   cyclobenzaprine (FLEXERIL) 5 MG tablet Take 1 tablet (5 mg total) by mouth 3 (three) times daily as needed for muscle spasms.   furosemide (LASIX) 20 MG tablet TAKE 2 TABLETS(40 MG) BY MOUTH DAILY AS NEEDED FOR FLUID RETENTION OR SWELLING    gabapentin (NEURONTIN) 100 MG capsule TAKE 2 CAPSULES THREE TIMES A DAY ALONG WITH 600 MG THREE TIMES A DAY FOR A TOTAL DAILY DOSE OF 2400 MG DAILY   gabapentin (NEURONTIN) 600 MG tablet TAKE 1 TABLET BY MOUTH 3  TIMES DAILY   glipiZIDE (GLUCOTROL) 10 MG tablet TAKE ONE-HALF TABLET BY  MOUTH TWICE DAILY BEFORE  MEALS   levothyroxine (SYNTHROID) 150 MCG tablet Take 1 tablet (150 mcg total) by mouth daily at 6 (six) AM.   lisinopril (ZESTRIL) 5 MG tablet TAKE 1 TABLET(5 MG) BY MOUTH DAILY   Melatonin 10 MG TABS Take 10 mg by mouth at bedtime.   metFORMIN (GLUCOPHAGE) 500 MG tablet Take 2 tablets (1,000 mg total) by mouth 2 (two) times daily with a meal.   nortriptyline (PAMELOR) 25 MG capsule TAKE 2 CAPSULES BY MOUTH AT BEDTIME   omeprazole (PRILOSEC) 20 MG capsule Take 1 capsule (20 mg total) by mouth daily.   oxyCODONE-acetaminophen (PERCOCET/ROXICET) 5-325 MG tablet Take 1 tablet by mouth every 8 (eight) hours as needed for severe pain.   traZODone (DESYREL) 50 MG tablet Take 2 tablets (100 mg total) by mouth at bedtime.   triamterene-hydrochlorothiazide (MAXZIDE-25) 37.5-25 MG tablet TAKE 1 TABLET BY MOUTH  DAILY   TRULICITY 1.5 WN/4.6EV SOPN ADMINISTER 1.5 MG UNDER THE SKIN 1 TIME A WEEK   No facility-administered medications prior to visit.    Review of Systems  HENT:  Positive for ear pain, rhinorrhea and sore throat. Negative for ear discharge and hearing loss.   Respiratory:  Positive  for cough.   Gastrointestinal:  Positive for abdominal pain and diarrhea. Negative for vomiting.  Musculoskeletal:  Negative for neck pain.  Skin:  Negative for rash.  Neurological:  Positive for headaches.     Objective    Temp 98.7 F (37.1 C) (Oral)    Wt 220 lb (99.8 kg)    BMI 31.57 kg/m    Physical Exam Constitutional:      General: She is not in acute distress.    Appearance: She is ill-appearing. She is not toxic-appearing.  HENT:     Left Ear: Decreased hearing noted. Swelling and  tenderness present.     Nose:     Right Sinus: Maxillary sinus tenderness and frontal sinus tenderness present.     Left Sinus: Maxillary sinus tenderness and frontal sinus tenderness present.  Neck:      Comments: Per pt report Pulmonary:     Effort: Pulmonary effort is normal.  Lymphadenopathy:     Cervical: Cervical adenopathy present.     Left cervical: Superficial cervical adenopathy, deep cervical adenopathy and posterior cervical adenopathy present.  Neurological:     General: No focal deficit present.     Mental Status: She is oriented to person, place, and time. Mental status is at baseline.  Psychiatric:        Mood and Affect: Mood normal.        Behavior: Behavior normal.        Thought Content: Thought content normal.        Judgment: Judgment normal.       Assessment & Plan     Problem List Items Addressed This Visit       Respiratory   Acute non-recurrent pansinusitis    10 days of symptoms No sick contacts tx with antibiotic      Relevant Medications   amoxicillin-clavulanate (AUGMENTIN) 875-125 MG tablet   fluticasone (FLONASE) 50 MCG/ACT nasal spray   predniSONE (DELTASONE) 10 MG tablet     Nervous and Auditory   Left acute otitis media - Primary    Likely infection given swelling from sinus involvement  Unable to visualize given nature of video visit      Relevant Medications   amoxicillin-clavulanate (AUGMENTIN) 875-125 MG tablet     Immune and Lymphatic   Glands swollen    L side  Per pt report Steroid burst provided      Relevant Medications   predniSONE (DELTASONE) 10 MG tablet     Other   Acute cough    Likely d/t drainage from sinuses Denies wheezing or rattle      Rhinorrhea    Generalized inflammatory response Reassurance provided      Relevant Medications   fluticasone (FLONASE) 50 MCG/ACT nasal spray     No follow-ups on file.     I discussed the assessment and treatment plan with the patient. The patient was  provided an opportunity to ask questions and all were answered. The patient agreed with the plan and demonstrated an understanding of the instructions.   The patient was advised to call back or seek an in-person evaluation if the symptoms worsen or if the condition fails to improve as anticipated.  I provided 8 minutes of non-face-to-face time during this encounter.  Vonna Kotyk, FNP, have reviewed all documentation for this visit. The documentation on 01/09/22 for the exam, diagnosis, procedures, and orders are all accurate and complete.  Patient seen and examined by Tally Joe,  FNP note scribed  by [employer], [place], [place] [phone] (phone) [phone] (fax)  [place]

## 2022-01-09 NOTE — Addendum Note (Signed)
Addended by: Tally Joe T on: 01/09/2022 06:11 PM   Modules accepted: Orders

## 2022-01-11 NOTE — Progress Notes (Signed)
PROVIDER NOTE: Information contained herein reflects review and annotations entered in association with encounter. Interpretation of such information and data should be left to medically-trained personnel. Information provided to patient can be located elsewhere in the medical record under "Patient Instructions". Document created using STT-dictation technology, any transcriptional errors that may result from process are unintentional.    Patient: Gabriella Marsh  Service Category: E/M  Provider: Gaspar Cola, MD  DOB: 07-Nov-1966  DOS: 01/12/2022  Specialty: Interventional Pain Management  MRN: [id number]  Setting: Ambulatory outpatient  PCP: [name], FNP  Type: Established Patient    Referring Provider: [name], FNP  Location: Office  Delivery: Face-to-face     Primary Reason(s) for Visit: Encounter for evaluation before starting new chronic pain management plan of care (Level of risk: moderate) CC: Hand Pain (bilateral)  HPI  Gabriella Marsh is a 56 y.o. year old, female patient, who comes today for a follow-up evaluation to review the test results and decide on a treatment plan. She has Diabetic peripheral neuropathy ([place]); Essential hypertension; Elevated liver function tests; Obesity; GERD (gastroesophageal reflux disease); Multinodular goiter; RLS (restless legs syndrome); Chronic low back pain (2ry area of Pain) (Bilateral) (L>R) w/o sciatica; Leg cramps; Adaptation reaction; DDD (degenerative disc disease), lumbosacral; Hypercholesteremia; Insomnia due to medical condition; Adiposity; Disorder of peripheral nervous system; Snores; Anxiety; Common migraine with intractable migraine; Benign positional vertigo; Schamberg's purpura; Leg swelling; Blue toe syndrome of left lower extremity ([place]); Thyroid nodule; S/P total thyroidectomy; Hypocalcemia; Chronic pain syndrome; Diabetic foot infection ([place]); Depression with anxiety; Sepsis ([place]); COVID-19 virus infection; Muscle spasm;  Type 2 diabetes mellitus with microalbuminuria, without long-term current use of insulin ([place]); Pharmacologic therapy; Disorder of skeletal system; Problems influencing health status; Chronic hand pain (1ry area of Pain) (Bilateral) (L>R); Chronic feet pain (3ry area of Pain) (Bilateral) (L>R); Chronic shoulder pain (4th area of Pain) (Left); Chronic knee pain (5th area of Pain) (Bilateral) (L>R); Lumbar facet syndrome (Bilateral); Glands swollen; Acute cough; Acute non-recurrent pansinusitis; Rhinorrhea; Left acute otitis media; Chronic sensorimotor polyneuropathy with axonal and demyelinating features; Osteoarthritis of knees (Bilateral); Tricompartment osteoarthritis of knees (Bilateral);  Grade 1 Retrolisthesis (9m) of L5 over S1; History of proximal humerus fracture (Left); Vitamin D deficiency; Elevated hemoglobin A1c; Hypochloremia; Hyperglycemia; Chronic peripheral neuropathic pain; and Type 2 diabetes mellitus with peripheral neuropathy (HCC) on their problem list. Her primarily concern today is the Hand Pain (bilateral)  Pain Assessment: Location: Right, Left Hand Radiating: denies Onset: More than a month ago Duration: Chronic pain Quality: Stabbing Severity: 5 /10 (subjective, self-reported pain score)  Effect on ADL: Limits activities Timing: Constant Modifying factors: gloves BP: 103/70   HR: 88  Ms. MThielkecomes in today for a follow-up visit after her initial evaluation on 11/12/2021. Today we went over the results of her tests. These were explained in "Layman's terms". During today's appointment we went over my diagnostic impression, as well as the proposed treatment plan.  Review of initial evaluation (11/12/2021): "According to the patient the primary area of pain is that of the hands (Bilateral) (L>R).  The patient indicates being right-handed.  According to the patient in the case of the right side she has pain in all the fingers but specially in the middle finger (C7) and ring  finger (C8).  She describes the pain as a sharp stabbing sensation in both the hand and the wrist.  She also indicates having subjective weakness of that right hand.  In the case of  the left hand the pain pattern and everything else is exactly the same as the right hand, but just not as intense.  The patient denies any surgeries, recent x-rays, or any nerve blocks in the wrists or hands.  She does admit to having had an EMG/PNCV (nerve conduction test) by Dr. Narda Amber, DO ([facility]).  She denies any treatments or physical therapy for this hand pain.  This condition appears to be a peripheral neuropathy of diabetic origin.  NCV & EMG Findings (DOS:02/07/2020): Extensive electrodiagnostic testing of the right upper extremity and additional studies of the left shows:  Bilateral median and radial sensory responses show reduced amplitude; additionally, the left median sensory response is mildly prolonged.  Bilateral ulnar sensory responses are absent. Bilateral median and ulnar motor responses reduced amplitude with slowed conduction velocity along the course of the nerve.  Additionally, bilateral median and left ulnar motor response shows prolonged latencies. Chronic motor axonal loss changes are seen affecting the intrinsic hand muscles, without accompanied active denervation.  Impression: The electrophysiologic findings are consistent with a chronic sensorimotor polyneuropathy, with axonal and demyelinating features.  Overall, these findings are moderate-to-severe in degree electrically.  The patient's secondary area of pain is that of the lower back (Bilateral) (L>R).  The patient denies any prior surgeries but does admit having had some nerve blocks done by Dr. Nathanial Millman. [place] [phone]) [phone] ([place]).  According to the patient the procedures done by Dr. Aris Lot "did seem to help.  She denies any physical therapy or any recent x-rays.  After  reviewing the electronic medical record none of these procedure reports are available for review.  However, during this review, it came to light that the patient has had several procedures done by Dr. Jennings Books [facility] neurology), in the form of sphenopalatine ganglion blocks.  It would appear that he has done several of these for the patient's intractable chronic migraine headaches without aura and without status migrainosus.  The patient's third area pain is that of her feet (Bilateral) (L>R).  The patient indicates having had surgery on July 24, 2021 where she had her big toe amputated by Dr. Jens Som.  Since then she has been experiencing issues with balance. The patient denies any nerve blocks, physical therapy, or any other treatments.  She does admit to having had some x-rays taken at the podiatry practice.  The patient describes having numbness in both of her feet but still feeling pins-and-needles this problem apparently started in her feet and has moved onto her hands.  This also appears to be secondary to a peripheral neuropathy associated with her uncontrolled diabetes.  The patient's fourth area pain is that of the shoulder (Left).  She indicates having had surgery 9 years ago after a fall that she experienced where she injured the area.  She does admit to having had physical therapy which did help and she also indicates having had an injection before the surgery which did not help.  She denies any recent x-rays of the area and she also indicates having had a fall last year and having fractured her left upper extremity.  This seems to have also aggravated the pain in that area.  The patient has been seen Carmelia Bake at the [facility] orthopedic surgery department for this particular problem.  The patient's fifth area pain is that of the knees (Bilateral) (L>R).  The patient denies any prior surgeries, physical therapy, joint injections, nerve blocks, or  any recent x-rays.  The  patient's sixth area pain is that of the lower extremities (Bilateral) (L>R).  The patient refers that the pain runs through the back of the legs but does not reach her feet.  She indicates that the low back pain is much worse than the lower extremity pain.  This would suggest a lower extremity pain to be referred from the lower back.  Again the patient denies any surgeries, recent x-rays, nerve blocks or joint injections, or any recent physical therapy for the lower extremities.  The patient does admit to having had a nerve conduction test for the lower extremities done approximately 10 years ago.  These are unavailable for evaluation.  Pharmacotherapy: The patient currently takes gabapentin 800 mg p.o. 3 times daily, nortriptyline, as well as hydrocodone/APAP.  She also indicated having taken oxycodone/APAP 5/325 in the past (last prescribed on 01/09/2022).  She describes at one time taking 1 tablet p.o. 4 times daily (120 tablets/month).  The patient also takes Flexeril 5 mg 3 times daily as needed.  Medical problems include uncontrolled diabetes with elevated hemoglobin A1c.  The patient refers having diabetes for the past 20 years."  Review of ordered tests reveal a vitamin D deficiency, elevated glucose levels, as well as decrease chloride and calcium levels.  X-rays of the right knee show mild tricompartmental osteoarthritis.  X-rays of the left knee show mild tricompartmental osteoarthritis with trace knee effusion.  Flexion and extension views of the lumbar spine show a 9 mm retrolisthesis of L5 over S1 with associated moderate degenerative changes as well as mild wedging of the L1 vertebral body.  The L5-S1 retrolisthesis seems to be stable.  X-rays of the left shoulder show mild degenerative changes with mild glenohumeral DJD and an old fracture deformity of the left proximal humerus.  All of these were explained to the patient in layman's terms.  Today I have spent a lot of time with the patient  talking to her about her diabetic peripheral neuropathy and the importance of getting her blood sugar under control as well as properly monitoring her blood sugar levels.  I will also gone over her vitamin D deficiency and the things that she needs to do to get it under control.  I have explained to the patient the importance of vitamin D and the perception of pain as well as controlling her blood sugar.  I also spent quite a bit of time talking to the patient about opioids and their lack of effectiveness when it comes to neuropathic pain.  She is currently taking Neurontin 800 mg p.o. 3 times daily.  I have encouraged her to continue taking that and I have informed her that the maximum dose and that would be 3200 mg/day or approximately 800 mg 4 times daily, if she can tolerate it.  I reminded her that while taking the Neurontin, she needs to continue monitoring her kidney function as this will determine how much she can really tolerate.  This will continue to be managed by her primary care physician.  Her peripheral neuropathy seems to be worse in her upper extremities and at this point Qutenza is not indicated for that.  I have offered to use the Qutenza for her lower extremities and she indicated that as long as it is approved by her insurance company, she is willing to try it.  I also spent quite a bit of time talking to her about the opioids, tolerance, drug holidays, and how she needs to  make sure that she keeps control over the medicine as opposed to allowing the medicine to take control over her.  At this point, I have offered the patient to start her back on oxycodone/APAP 5/325, 1 tab p.o. every 8 hours (90/month), but I have also informed her that I rather stay at that level and whenever she feels she is developing tolerance, she needs to then do the drug holiday for the purpose of eliminating that tolerance and going back to a lower dose.  She understood and accepted.  The patient was also informed  that at this time I do not have space in my practice to add any more patients for medication management and therefore I will be scheduling her to return to see my partner Dr. Gillis Santa for long-term management of her oxycodone.  She understood and accepted.  Controlled Substance Pharmacotherapy Assessment REMS (Risk Evaluation and Mitigation Strategy)  Opioid Analgesic: Oxycodone/APAP 5/325 1 tab p.o. 4 times daily. MME/day: 30 mg/day  Pill Count: None expected due to no prior prescriptions written by our practice. Dewayne Shorter, RN  01/12/2022  9:05 AM  Sign when Signing Visit Nursing Pain Medication Assessment:  Safety precautions to be maintained throughout the outpatient stay will include: orient to surroundings, keep bed in low position, maintain call bell within reach at all times, provide assistance with transfer out of bed and ambulation.  Medication Inspection Compliance: Pill count conducted under aseptic conditions, in front of the patient. Neither the pills nor the bottle was removed from the patient's sight at any time. Once count was completed pills were immediately returned to the patient in their original bottle.  Medication: Oxycodone/APAP Pill/Patch Count:  1 of 50 pills remain Pill/Patch Appearance: Markings consistent with prescribed medication Bottle Appearance: Standard pharmacy container. Clearly labeled. Filled Date: 51 / 27 / 2022 Last Medication intake:  Yesterday   Pharmacokinetics: Liberation and absorption (onset of action): WNL Distribution (time to peak effect): WNL Metabolism and excretion (duration of action): WNL         Pharmacodynamics: Desired effects: Analgesia: Ms. Szymanowski reports >50% benefit. Functional ability: Patient reports that medication allows her to accomplish basic ADLs Clinically meaningful improvement in function (CMIF): Sustained CMIF goals met Perceived effectiveness: Described as relatively effective, allowing for increase in  activities of daily living (ADL) Undesirable effects: Side-effects or Adverse reactions: None reported Monitoring: [place] PMP: PDMP reviewed during this encounter. Online review of the past 46-monthperiod previously conducted. Not applicable at this point since we have not taken over the patient's medication management yet. List of other Serum/Urine Drug Screening Test(s):  No results found for: AMPHSCRSER, BARBSCRSER, BENZOSCRSER, COCAINSCRSER, COCAINSCRNUR, PCPSCRSER, THCSCRSER, THCU, CANNABQUANT, [place] ETattnallList of all UDS test(s) done:  Lab Results  Component Value Date   SUMMARY Note 11/12/2021   Last UDS on record: Summary  Date Value Ref Range Status  11/12/2021 Note  Final    Comment:    ==================================================================== Compliance Drug Analysis, Ur ==================================================================== Test                             Result       Flag       Units  Drug Present and Declared for Prescription Verification   Oxycodone                      1136         EXPECTED  ng/mg creat   Oxymorphone                    1753         EXPECTED   ng/mg creat   Noroxycodone                   1215         EXPECTED   ng/mg creat   Noroxymorphone                 779          EXPECTED   ng/mg creat    Sources of oxycodone are scheduled prescription medications.    Oxymorphone, noroxycodone, and noroxymorphone are expected    metabolites of oxycodone. Oxymorphone is also available as a    scheduled prescription medication.    Gabapentin                     PRESENT      EXPECTED   Cyclobenzaprine                PRESENT      EXPECTED   Desmethylcyclobenzaprine       PRESENT      EXPECTED    Desmethylcyclobenzaprine is an expected metabolite of    cyclobenzaprine.    Nortriptyline                  PRESENT      EXPECTED    Nortriptyline may be administered as a prescription drug; it is also    an  expected metabolite of amitriptyline.    Trazodone                      PRESENT      EXPECTED   1,3 chlorophenyl piperazine    PRESENT      EXPECTED    1,3-chlorophenyl piperazine is an expected metabolite of trazodone.    Acetaminophen                  PRESENT      EXPECTED ==================================================================== Test                      Result    Flag   Units      Ref Range   Creatinine              165              mg/dL      >=20 ==================================================================== Declared Medications:  The flagging and interpretation on this report are based on the  following declared medications.  Unexpected results may arise from  inaccuracies in the declared medications.   **Note: The testing scope of this panel includes these medications:   Cyclobenzaprine (Flexeril)  Gabapentin (Neurontin)  Nortriptyline (Pamelor)  Oxycodone (Percocet)  Trazodone (Desyrel)   **Note: The testing scope of this panel does not include small to  moderate amounts of these reported medications:   Acetaminophen (Percocet)   **Note: The testing scope of this panel does not include the  following reported medications:   Atorvastatin  Clopidogrel  Dulaglutide (Trulicity)  Glipizide (Glucotrol)  Hydrochlorothiazide  Levothyroxine  Lisinopril (Zestril)  Melatonin  Metformin (Glucophage)  Omeprazole  Triamterene ==================================================================== For clinical consultation, please call [phone]. ====================================================================    UDS interpretation: No unexpected findings.          Medication Assessment Form: Patient introduced  to form today Treatment compliance: Treatment may start today if patient agrees with proposed plan. Evaluation of compliance is not applicable at this point Risk Assessment Profile: Aberrant behavior: See initial evaluations. None observed  or detected today Comorbid factors increasing risk of overdose: See initial evaluation. No additional risks detected today Opioid risk tool (ORT):  Opioid Risk  01/12/2022  Alcohol 0  Illegal Drugs 0  Rx Drugs 0  Alcohol 0  Illegal Drugs 0  Rx Drugs 0  Age between 16-45 years  0  History of Preadolescent Sexual Abuse 0  Psychological Disease 0  Depression 0  Opioid Risk Tool Scoring 0  Opioid Risk Interpretation Low Risk    ORT Scoring interpretation table:  Score <3 = Low Risk for SUD  Score between 4-7 = Moderate Risk for SUD  Score >8 = High Risk for Opioid Abuse   Risk of substance use disorder (SUD): Low  Risk Mitigation Strategies:  Patient opioid safety counseling: Completed today. Counseling provided to patient as per "Patient Counseling Document". Document signed by patient, attesting to counseling and understanding Patient-Prescriber Agreement (PPA): Obtained today.  Controlled substance notification to other providers: Written and sent today.  Pharmacologic Plan: Today we may be taking over the patient's pharmacological regimen. See below.             Laboratory Chemistry Profile   Renal Lab Results  Component Value Date   BUN 14 11/12/2021   CREATININE 0.71 11/12/2021   BCR 20 11/12/2021   GFR 111.18 06/05/2015   GFRAA 92 01/03/2021   GFRNONAA >60 07/25/2021   SPECGRAV 1.020 05/22/2020   PHUR 6.0 05/22/2020   PROTEINUR Negative 05/22/2020     Electrolytes Lab Results  Component Value Date   NA 135 11/12/2021   K 4.2 11/12/2021   CL 89 (L) 11/12/2021   CALCIUM 8.2 (L) 11/12/2021   MG 1.8 11/12/2021     Hepatic Lab Results  Component Value Date   AST 23 11/12/2021   ALT 12 07/24/2021   ALBUMIN 4.4 11/12/2021   ALKPHOS 75 11/12/2021   LIPASE 47 05/04/2015     ID Lab Results  Component Value Date   HIV Non Reactive 03/28/2021   SARSCOV2NAA NEGATIVE 12/29/2021     Bone Lab Results  Component Value Date   25OHVITD1 14 (L) 11/12/2021    25OHVITD2 <1.0 11/12/2021   25OHVITD3 13 11/12/2021     Endocrine Lab Results  Component Value Date   GLUCOSE 203 (H) 11/12/2021   GLUCOSEU 250 (A) 09/30/2016   HGBA1C 9.2 (A) 10/22/2021   TSH 1.750 01/03/2021     Neuropathy Lab Results  Component Value Date   VITAMINB12 300 11/12/2021   HGBA1C 9.2 (A) 10/22/2021   HIV Non Reactive 03/28/2021     CNS No results found for: COLORCSF, APPEARCSF, RBCCOUNTCSF, WBCCSF, POLYSCSF, LYMPHSCSF, EOSCSF, PROTEINCSF, GLUCCSF, JCVIRUS, CSFOLI, IGGCSF, LABACHR, ACETBL, LABACHR, ACETBL   Inflammation (CRP: Acute   ESR: Chronic) Lab Results  Component Value Date   CRP 4 11/12/2021   ESRSEDRATE 39 11/12/2021   LATICACIDVEN 2.4 ([place]) 07/22/2021     Rheumatology No results found for: RF, ANA, LABURIC, URICUR, LYMEIGGIGMAB, LYMEABIGMQN, HLAB27   Coagulation Lab Results  Component Value Date   INR 1.0 07/22/2021   LABPROT 13.4 07/22/2021   APTT 30 07/22/2021   PLT 426 (H) 07/24/2021     Cardiovascular Lab Results  Component Value Date   BNP 46.8 07/22/2021   TROPONINI <0.03 05/04/2015   HGB 11.0 (  L) 07/24/2021   HCT 33.5 (L) 07/24/2021     Screening Lab Results  Component Value Date   SARSCOV2NAA NEGATIVE 12/29/2021   HIV Non Reactive 03/28/2021     Cancer No results found for: CEA, CA125, LABCA2   Allergens No results found for: ALMOND, APPLE, ASPARAGUS, AVOCADO, BANANA, BARLEY, BASIL, BAYLEAF, GREENBEAN, LIMABEAN, WHITEBEAN, BEEFIGE, REDBEET, BLUEBERRY, BROCCOLI, CABBAGE, MELON, CARROT, CASEIN, CASHEWNUT, CAULIFLOWER, CELERY     Note: Lab results reviewed.  Recent Diagnostic Imaging Review  Shoulder Imaging: Shoulder-L DG: Results for orders placed during the hospital encounter of 11/12/21 DG Shoulder Left  Narrative CLINICAL DATA:  Shoulder pain  EXAM: LEFT SHOULDER - 2+ VIEW  COMPARISON:  10/12/2020  FINDINGS: No fracture or malalignment. Mild glenohumeral degenerative changes. Old fracture deformity of the  proximal humerus.  IMPRESSION: No acute osseous abnormality.  Mild degenerative change   Electronically Signed By: Donavan Foil M.D. On: 11/12/2021 22:22  Thoracic Imaging: Thoracic DG w/swimmers view: Results for orders placed during the hospital encounter of 04/01/20 [facility] Thoracic Spine W/Swimmers  Narrative CLINICAL DATA:  Left rib pain.  EXAM: THORACIC SPINE - 3 VIEWS  COMPARISON:  MRI lumbar spine 06/06/2015. Chest x-ray 11/13/2008.  FINDINGS: Diffuse osteopenia and degenerative change. Mild scoliosis. Mild L1 compression again noted without interim change. No acute bony abnormality identified. Surgical clips right upper quadrant.  IMPRESSION: Diffuse osteopenia degenerative change. Mild scoliosis. Mild L1 compression, no change. No acute abnormality identified.   Electronically Signed By: Marcello Moores  Register On: 04/02/2020 05:46  Lumbosacral Imaging: Lumbar DG Bending views: Results for orders placed during the hospital encounter of 11/12/21 DG Lumbar Spine Complete W/Bend  Narrative CLINICAL DATA:  Chronic back pain  EXAM: LUMBAR SPINE - COMPLETE WITH BENDING VIEWS  COMPARISON:  MRI 06/06/2015  FINDINGS: Mild levocurvature. Mild wedging of the L1 vertebral body suspect that this is chronic. 9 mm retrolisthesis L5 on S1 which does not significantly change between flexion or extension. Moderate disc space narrowing and degenerative change at L5-S1 with mild degenerative change at L1-L2 and L2-L3. Aortic atherosclerosis.  IMPRESSION: 1. 9 mm retrolisthesis L5 on S1 with associated moderate degenerative changes 2. Mild degenerative changes elsewhere in the lumbar spine 3. Chronic appearing mild wedging of L1 vertebral body   Electronically Signed By: Donavan Foil M.D. On: 11/12/2021 22:20  Knee Imaging: Knee-R DG 4 views: Results for orders placed during the hospital encounter of 11/12/21 DG Knee Complete 4 Views Right  Narrative CLINICAL DATA:   Knee pain  EXAM: RIGHT KNEE - COMPLETE 4+ VIEW  COMPARISON:  None.  FINDINGS: No fracture or malalignment. Mild tricompartment arthritis with trace knee effusion. Mild sclerosis in the proximal fibula may reflect small infarct or chondroid lesion.  IMPRESSION: No acute osseous abnormality.  Mild tricompartment arthritis   Electronically Signed By: Donavan Foil M.D. On: 11/12/2021 22:21  Knee-L DG 4 views: Results for orders placed during the hospital encounter of 11/12/21 DG Knee Complete 4 Views Left  Narrative CLINICAL DATA:  Knee pain  EXAM: LEFT KNEE - COMPLETE 4+ VIEW  COMPARISON:  None.  FINDINGS: No fracture or malalignment. Mild tricompartment arthritis with trace effusion.  IMPRESSION: Mild tricompartment arthritis with trace knee effusion   Electronically Signed By: Donavan Foil M.D. On: 11/12/2021 22:17  Foot Imaging: Foot-L DG Complete: Results for orders placed during the hospital encounter of 09/03/20 DG Foot Complete Left  Narrative CLINICAL DATA:  Erythema of the left fourth toe with swelling. Diabetes.  EXAM: LEFT  FOOT - COMPLETE 3+ VIEW  COMPARISON:  11/11/2016  FINDINGS: Diffuse mild bony demineralization. No bony destructive findings characteristic of osteomyelitis. Normal Lisfranc joint alignment. No appreciable fracture.  Soft tissue swelling along the dorsum of the foot distally. This is increased compared to the 11/11/2016 exam.  Small plantar and Achilles calcaneal spurs.  No obvious gas tracking within the soft tissues.  IMPRESSION: 1. Increased soft tissue swelling along the dorsum of the foot distally. No underlying bony destructive findings characteristic of osteomyelitis. 2. Diffuse bony demineralization. 3. Small plantar and Achilles calcaneal spurs.   Electronically Signed By: Van Clines M.D. On: 09/03/2020 19:07  Complexity Note: Imaging results reviewed. Results shared with Ms. Cloer, using  Layman's terms.                        Meds   Current Outpatient Medications:    amoxicillin-clavulanate (AUGMENTIN) 875-125 MG tablet, Take 1 tablet by mouth 2 (two) times daily., Disp: 20 tablet, Rfl: 0   atorvastatin (LIPITOR) 20 MG tablet, Take 1 tablet (20 mg total) by mouth daily., Disp: 90 tablet, Rfl: 3   Cholecalciferol (VITAMIN D3) 125 MCG (5000 UT) CAPS, Take 1 capsule (5,000 Units total) by mouth daily with breakfast. Take along with calcium and magnesium., Disp: 30 capsule, Rfl: 2   clopidogrel (PLAVIX) 75 MG tablet, TAKE 1 TABLET(75 MG) BY MOUTH DAILY, Disp: 30 tablet, Rfl: 6   cyclobenzaprine (FLEXERIL) 5 MG tablet, Take 1 tablet (5 mg total) by mouth 3 (three) times daily as needed for muscle spasms., Disp: 270 tablet, Rfl: 1   ergocalciferol (VITAMIN D2) 1.25 MG (50000 UT) capsule, Take 1 capsule (50,000 Units total) by mouth 2 (two) times a week. X 6 weeks., Disp: 12 capsule, Rfl: 0   fluticasone (FLONASE) 50 MCG/ACT nasal spray, Place 2 sprays into both nostrils daily., Disp: 16 g, Rfl: 6   furosemide (LASIX) 20 MG tablet, TAKE 2 TABLETS(40 MG) BY MOUTH DAILY AS NEEDED FOR FLUID RETENTION OR SWELLING, Disp: 180 tablet, Rfl: 3   gabapentin (NEURONTIN) 100 MG capsule, TAKE 2 CAPSULES THREE TIMES A DAY ALONG WITH 600 MG THREE TIMES A DAY FOR A TOTAL DAILY DOSE OF 2400 MG DAILY, Disp: 360 capsule, Rfl: 3   gabapentin (NEURONTIN) 600 MG tablet, TAKE 1 TABLET BY MOUTH 3  TIMES DAILY, Disp: 270 tablet, Rfl: 1   glipiZIDE (GLUCOTROL) 10 MG tablet, TAKE ONE-HALF TABLET BY  MOUTH TWICE DAILY BEFORE  MEALS, Disp: 90 tablet, Rfl: 3   levothyroxine (SYNTHROID) 150 MCG tablet, Take 1 tablet (150 mcg total) by mouth daily at 6 (six) AM., Disp: 30 tablet, Rfl: 11   lisinopril (ZESTRIL) 5 MG tablet, TAKE 1 TABLET(5 MG) BY MOUTH DAILY, Disp: 90 tablet, Rfl: 3   Melatonin 10 MG TABS, Take 10 mg by mouth at bedtime., Disp: , Rfl:    metFORMIN (GLUCOPHAGE) 500 MG tablet, Take 2 tablets (1,000 mg  total) by mouth 2 (two) times daily with a meal., Disp: 360 tablet, Rfl: 3   nortriptyline (PAMELOR) 25 MG capsule, TAKE 2 CAPSULES BY MOUTH AT BEDTIME, Disp: 180 capsule, Rfl: 1   omeprazole (PRILOSEC) 20 MG capsule, Take 1 capsule (20 mg total) by mouth daily., Disp: 90 capsule, Rfl: 3   oxyCODONE-acetaminophen (PERCOCET) 5-325 MG tablet, Take 1 tablet by mouth every 6 (six) hours as needed for severe pain. Must last 30 days., Disp: 120 tablet, Rfl: 0   predniSONE (DELTASONE) 10 MG tablet, Take  1 tablet (10 mg total) by mouth daily with breakfast., Disp: 5 tablet, Rfl: 0   traZODone (DESYREL) 50 MG tablet, Take 2 tablets (100 mg total) by mouth at bedtime., Disp: 180 tablet, Rfl: 1   triamterene-hydrochlorothiazide (MAXZIDE-25) 37.5-25 MG tablet, TAKE 1 TABLET BY MOUTH  DAILY, Disp: 90 tablet, Rfl: 3   TRULICITY 1.5 MQ/2.8MN SOPN, ADMINISTER 1.5 MG UNDER THE SKIN 1 TIME A WEEK, Disp: 2 mL, Rfl: 1  ROS  Constitutional: Denies any fever or chills Gastrointestinal: No reported hemesis, hematochezia, vomiting, or acute GI distress Musculoskeletal: Denies any acute onset joint swelling, redness, loss of ROM, or weakness Neurological: No reported episodes of acute onset apraxia, aphasia, dysarthria, agnosia, amnesia, paralysis, loss of coordination, or loss of consciousness  Allergies  [name] is allergic to celecoxib, pregabalin, buspar [buspirone], and citalopram.  PFSH  Drug: Ms. Remsen  reports no history of drug use. Alcohol:  reports no history of alcohol use. Tobacco:  reports that she quit smoking about 10 years ago. Her smoking use included cigarettes. She has never used smokeless tobacco. Medical:  has a past medical history of Arthritis, Blood clotting disorder ([place]), Degenerative disc disease, lumbar, Diabetes mellitus without complication ([place]), GERD (gastroesophageal reflux disease), Headache, Hyperlipidemia, Hypertension, Neuropathy, and Vertigo. Surgical: Ms. Mcclimans  has a  past surgical history that includes Cesarean section; Shoulder surgery (Left, 12/26/014); Abdominal hysterectomy; Tonsillectomy and adenoidectomy; Uterine fibroid surgery; Ovarian cyst surgery; Cholecystectomy; left arm frature (10/12/2020); Thyroidectomy (N/A, 03/28/2021); Parathyroidectomy (03/28/2021); and Amputation toe (Right, 07/24/2021). Family: family history includes Congestive Heart Failure in her father; Diabetes in her father; Healthy in her brother; Heart disease in her father; Hyperlipidemia in her father; Hypertension in her father; Irritable bowel syndrome in her mother; Osteoporosis in her maternal grandmother and mother.  Constitutional Exam  General appearance: Well nourished, well developed, and well hydrated. In no apparent acute distress Vitals:   01/12/22 0859 01/12/22 0900  BP:  103/70  Pulse:  88  Resp:  18  Temp: (!) 97 F (36.1 C)   SpO2:  99%  Weight: 220 lb (99.8 kg)   Height: 5' 10"  (1.778 m)    BMI Assessment: Estimated body mass index is 31.57 kg/m as calculated from the following:   Height as of this encounter: 5' 10"  (1.778 m).   Weight as of this encounter: 220 lb (99.8 kg).  BMI interpretation table: BMI level Category Range association with higher incidence of chronic pain  <18 kg/m2 Underweight   18.5-24.9 kg/m2 Ideal body weight   25-29.9 kg/m2 Overweight Increased incidence by 20%  30-34.9 kg/m2 Obese (Class I) Increased incidence by 68%  35-39.9 kg/m2 Severe obesity (Class II) Increased incidence by 136%  >40 kg/m2 Extreme obesity (Class III) Increased incidence by 254%   Patient's current BMI Ideal Body weight  Body mass index is 31.57 kg/m. Ideal body weight: 68.5 kg (151 lb 0.2 oz) Adjusted ideal body weight: 81 kg (178 lb 9.7 oz)   BMI Readings from Last 4 Encounters:  01/12/22 31.57 kg/m  01/09/22 31.57 kg/m  11/12/21 32.28 kg/m  10/22/21 41.30 kg/m   Wt Readings from Last 4 Encounters:  01/12/22 220 lb (99.8 kg)  01/09/22 220 lb  (99.8 kg)  11/12/21 225 lb (102.1 kg)  10/22/21 225 lb 12.8 oz (102.4 kg)    Psych/Mental status: Alert, oriented x 3 (person, place, & time)       Eyes: PERLA Respiratory: No evidence of acute respiratory distress  Assessment & Plan  Primary Diagnosis & Pertinent Problem List: The primary encounter diagnosis was Chronic hand pain (1ry area of Pain) (Bilateral) (L>R). 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, Chronic peripheral neuropathic pain, Type 2 diabetes mellitus with peripheral neuropathy (HCC), Chronic shoulder pain (4th area of Pain) (Left), History of proximal humerus fracture (Left), Chronic knee pain (5th area of Pain) (Bilateral) (L>R), Osteoarthritis of knees (Bilateral), Tricompartment osteoarthritis of knees (Bilateral), Chronic pain syndrome, Problems influencing health status, Vitamin D deficiency, Elevated hemoglobin A1c, Hypochloremia, Hyperglycemia, Pharmacologic therapy, Chronic use of opiate for therapeutic purpose, Encounter for medication management, and Encounter for chronic pain management were also pertinent to this visit.  Visit Diagnosis: 1. Chronic hand pain (1ry area of Pain) (Bilateral) (L>R)   2. Disorder of peripheral nervous system   3. Chronic low back pain (2ry area of Pain) (Bilateral) (L>R) w/o sciatica   4.  Grade 1 Retrolisthesis (9221m of L5 over S1   5. Chronic feet pain (3ry area of Pain) (Bilateral) (L>R)   6. Diabetic peripheral neuropathy ([place]  7. 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 Chronic peripheral neuropathic pain   9. Type 2 diabetes mellitus with peripheral neuropathy (HCC)   10. Chronic shoulder pain  (4th area of Pain) (Left)   11. History of proximal humerus fracture (Left)   12. Chronic knee pain (5th area of Pain) (Bilateral) (L>R)   13. Osteoarthritis of knees (Bilateral)   14. Tricompartment osteoarthritis of knees (Bilateral)   15. Chronic pain syndrome   16. Problems influencing health status   17. Vitamin D deficiency   18. Elevated hemoglobin A1c   19. Hypochloremia   20. Hyperglycemia   21. Pharmacologic therapy   22. Chronic use of opiate for therapeutic purpose   23. Encounter for medication management   24. 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  Bilateral ulnar sensory responses are absent. Bilateral median and ulnar motor responses reduced amplitude with slowed conduction velocity along the course of the nerve.  Additionally, bilateral median and left ulnar motor response shows prolonged latencies. Chronic motor axonal loss changes are seen affecting the intrinsic hand muscles, without accompanied active denervation.  Impression: The electrophysiologic findings are consistent with a chronic sensorimotor polyneuropathy, with axonal and demyelinating features.  Overall, these findings are moderate-to-severe in degree electrically.   Osteoarthritis of knees (Bilateral)   X-rays of the right knee show mild tricompartmental osteoarthritis.  X-rays of the left knee show mild tricompartmental osteoarthritis with trace knee effusion.   Tricompartment osteoarthritis of knees (Bilateral)   X-rays of the right knee show mild tricompartmental osteoarthritis.  X-rays  of the left knee show mild tricompartmental osteoarthritis with trace knee effusion.    Grade 1 Retrolisthesis (21m60mof L5 over S1   Flexion and extension views of the lumbar spine show a 9 mm retrolisthesis of L5 over S1 with associated moderate degenerative changes. The  L5-S1 retrolisthesis seems to be stable.    History of proximal humerus fracture (Left)   (10/12/2020)   Chronic Peripheral Neuropathic Pain  Diabetic Peripheral Neuropathy (Hcc)   Monofilament- No Feeling bilaterally   Vitamin D Deficiency  Elevated Hemoglobin A1c   (10/22/2021) 9.2   Hypochloremia  Hyperglycemia  Type 2 Diabetes Mellitus With Peripheral Neuropathy (Hcc)  Glands Swollen  Acute Cough  Acute Non-Recurrent Pansinusitis  Rhinorrhea  Left Acute Otitis Media  Type 2 Diabetes Mellitus With Microalbuminuria, Without Long-Term Current Use of Insulin (Hcc)  Neuropathy (Resolved)  Restless Leg (Resolved)  Cystitis (Resolved)  Depression, Major, Single Episode, Moderate (Hcc) (Resolved)  Bp (High Blood Pressure) (Resolved)  Hyperlipidemia (Resolved)    Plan of Care  Pharmacotherapy (Medications Ordered): Meds ordered this encounter  Medications   ergocalciferol (VITAMIN D2) 1.25 MG (50000 UT) capsule    Sig: Take 1 capsule (50,000 Units total) by mouth 2 (two) times a week. X 6 weeks.    Dispense:  12 capsule    Refill:  0    Fill one day early if pharmacy is closed on scheduled refill date. May substitute for generic, or similar, if available.   Cholecalciferol (VITAMIN D3) 125 MCG (5000 UT) CAPS    Sig: Take 1 capsule (5,000 Units total) by mouth daily with breakfast. Take along with calcium and magnesium.    Dispense:  30 capsule    Refill:  2    Fill 1 day early if pharmacy is closed on scheduled refill date. Generic permitted. Do not send renewal requests.   oxyCODONE-acetaminophen (PERCOCET) 5-325 MG tablet    Sig: Take 1 tablet by mouth every 6 (six) hours as needed for severe pain. Must  last 30 days.    Dispense:  120 tablet    Refill:  0    DO NOT: delete (not duplicate); no partial-fill (will deny script to complete), no refill request (F/U required). DISPENSE: 1 day early if closed on fill date. WARN: No CNS-depressants within 8 hrs of med.   Procedure Orders         NEUROLYSIS     Lab Orders  No laboratory test(s) ordered today   Imaging Orders  No imaging studies ordered today   Referral Orders  No referral(s) requested today   Pharmacological management options:  Opioid Analgesics: We'll take over management today. See above orders Membrane stabilizer: I will not be prescribing any at this time.  Continue with gabapentin (Neurontin) 600 mg tablet p.o. 3 times daily.  Consider titrating up to 800 mg p.o. 3 times daily if tolerated.  Make sure that the titration is low so as to increase the patient's ability to tolerate the increases without getting too sleepy.  Try increasing by no more than 100 mg p.o. every 3 weeks. Muscle relaxant: I will not be prescribing any at this time.  May continue with Flexeril. NSAID: I will not be prescribing any at this time. Other analgesic(s): I will not be prescribing any at this time.      Interventional Therapies  Risk   Complexity Considerations:   Estimated body mass index is 32.28 kg/m as calculated from the following:   Height as of this encounter: 5' 10"  (1.778 m).   Weight as of this encounter: 225 lb (102.1 kg). Uncontrolled type 2 diabetes    Planned   Pending:      Under consideration:   Qutenza treatments Diagnostic bilateral lumbar facet block  Diagnostic bilateral Zilretta knee joint injections  Therapeutic bilateral Monovisc knee injections  Diagnostic left shoulder injection  Diagnostic left suprascapular NB  Possible candidate for cervical/lumbar spinal cord stimulator trial    Completed:   None at this time   Therapeutic   Palliative (PRN) options:   None established    Provider-requested  follow-up: Return for (20mn), Eval-day (Dr. LHolley Raring, (F2F), (MM) w/ Dr. LHolley Raring Recent Visits Date Type Provider Dept  11/12/21 Office Visit NMilinda Pointer MD [facility]  Showing recent visits within past 90 days and meeting all other requirements Today's Visits Date Type Provider Dept  01/12/22 Office Visit NMilinda Pointer MD [facility]  Showing today's visits and meeting all other requirements Future Appointments Date Type Provider Dept  02/03/22 Appointment LGillis Santa MD [facility]  Showing future appointments within next 90 days and meeting all other requirements  Primary Care Physician: PGwyneth Sprout FNP Note by: FGaspar Cola MD Date: 01/12/2022; Time: 10:19 AM

## 2022-01-12 ENCOUNTER — Telehealth: Payer: Self-pay

## 2022-01-12 ENCOUNTER — Encounter: Payer: Self-pay | Admitting: Pain Medicine

## 2022-01-12 ENCOUNTER — Ambulatory Visit: Payer: BLUE CROSS/BLUE SHIELD | Attending: Pain Medicine | Admitting: Pain Medicine

## 2022-01-12 ENCOUNTER — Other Ambulatory Visit: Payer: Self-pay

## 2022-01-12 VITALS — BP 103/70 | HR 88 | Temp 97.0°F | Resp 18 | Ht 70.0 in | Wt 220.0 lb

## 2022-01-12 DIAGNOSIS — G894 Chronic pain syndrome: Secondary | ICD-10-CM | POA: Diagnosis present

## 2022-01-12 DIAGNOSIS — E559 Vitamin D deficiency, unspecified: Secondary | ICD-10-CM | POA: Insufficient documentation

## 2022-01-12 DIAGNOSIS — M792 Neuralgia and neuritis, unspecified: Secondary | ICD-10-CM | POA: Diagnosis present

## 2022-01-12 DIAGNOSIS — G8929 Other chronic pain: Secondary | ICD-10-CM | POA: Insufficient documentation

## 2022-01-12 DIAGNOSIS — M545 Low back pain, unspecified: Secondary | ICD-10-CM | POA: Diagnosis present

## 2022-01-12 DIAGNOSIS — Z79891 Long term (current) use of opiate analgesic: Secondary | ICD-10-CM | POA: Insufficient documentation

## 2022-01-12 DIAGNOSIS — E1142 Type 2 diabetes mellitus with diabetic polyneuropathy: Secondary | ICD-10-CM | POA: Insufficient documentation

## 2022-01-12 DIAGNOSIS — Z789 Other specified health status: Secondary | ICD-10-CM | POA: Insufficient documentation

## 2022-01-12 DIAGNOSIS — Z79899 Other long term (current) drug therapy: Secondary | ICD-10-CM | POA: Diagnosis present

## 2022-01-12 DIAGNOSIS — G64 Other disorders of peripheral nervous system: Secondary | ICD-10-CM | POA: Insufficient documentation

## 2022-01-12 DIAGNOSIS — E878 Other disorders of electrolyte and fluid balance, not elsewhere classified: Secondary | ICD-10-CM | POA: Insufficient documentation

## 2022-01-12 DIAGNOSIS — M79642 Pain in left hand: Secondary | ICD-10-CM | POA: Diagnosis present

## 2022-01-12 DIAGNOSIS — M79672 Pain in left foot: Secondary | ICD-10-CM | POA: Insufficient documentation

## 2022-01-12 DIAGNOSIS — M25512 Pain in left shoulder: Secondary | ICD-10-CM | POA: Insufficient documentation

## 2022-01-12 DIAGNOSIS — M79671 Pain in right foot: Secondary | ICD-10-CM | POA: Diagnosis present

## 2022-01-12 DIAGNOSIS — R739 Hyperglycemia, unspecified: Secondary | ICD-10-CM | POA: Diagnosis present

## 2022-01-12 DIAGNOSIS — M79641 Pain in right hand: Secondary | ICD-10-CM | POA: Insufficient documentation

## 2022-01-12 DIAGNOSIS — M170 Bilateral primary osteoarthritis of knee: Secondary | ICD-10-CM | POA: Diagnosis present

## 2022-01-12 DIAGNOSIS — Z8781 Personal history of (healed) traumatic fracture: Secondary | ICD-10-CM | POA: Insufficient documentation

## 2022-01-12 DIAGNOSIS — M25562 Pain in left knee: Secondary | ICD-10-CM | POA: Diagnosis present

## 2022-01-12 DIAGNOSIS — M4310 Spondylolisthesis, site unspecified: Secondary | ICD-10-CM | POA: Diagnosis present

## 2022-01-12 DIAGNOSIS — M25561 Pain in right knee: Secondary | ICD-10-CM | POA: Insufficient documentation

## 2022-01-12 DIAGNOSIS — G608 Other hereditary and idiopathic neuropathies: Secondary | ICD-10-CM | POA: Diagnosis present

## 2022-01-12 DIAGNOSIS — R7309 Other abnormal glucose: Secondary | ICD-10-CM | POA: Diagnosis present

## 2022-01-12 MED ORDER — ERGOCALCIFEROL 1.25 MG (50000 UT) PO CAPS: 50000.0000 [IU] | ORAL_CAPSULE | ORAL | 0 refills | Status: AC

## 2022-01-12 MED ORDER — VITAMIN D3 125 MCG (5000 UT) PO CAPS: 1.0000 | ORAL_CAPSULE | Freq: Every day | ORAL | 2 refills | Status: DC

## 2022-01-12 MED ORDER — OXYCODONE-ACETAMINOPHEN 5-325 MG PO TABS: 1.0000 | ORAL_TABLET | Freq: Four times a day (QID) | ORAL | 0 refills | Status: DC | PRN

## 2022-01-12 NOTE — Telephone Encounter (Signed)
Patient is to be scheduled for Qutenza patches

## 2022-01-12 NOTE — Progress Notes (Signed)
Nursing Pain Medication Assessment:  Safety precautions to be maintained throughout the outpatient stay will include: orient to surroundings, keep bed in low position, maintain call bell within reach at all times, provide assistance with transfer out of bed and ambulation.  Medication Inspection Compliance: Pill count conducted under aseptic conditions, in front of the patient. Neither the pills nor the bottle was removed from the patient's sight at any time. Once count was completed pills were immediately returned to the patient in their original bottle.  Medication: Oxycodone/APAP Pill/Patch Count:  1 of 50 pills remain Pill/Patch Appearance: Markings consistent with prescribed medication Bottle Appearance: Standard pharmacy container. Clearly labeled. Filled Date: 86 / 27 / 2022 Last Medication intake:  Yesterday

## 2022-01-12 NOTE — Patient Instructions (Addendum)
Capsaicin topical patch What is this medication? CAPSAICIN (cap SAY sin) is a pain reliever. It is used to treat postherpetic neuralgia (PHN) or diabetic neuropathy pain. This medicine may be used for other purposes; ask your health care provider or pharmacist if you have questions. COMMON BRAND NAME(S): Qutenza What should I tell my care team before I take this medication? They need to know if you have any of these conditions: broken or irritated skin high blood pressure history of heart attack or stroke an unusual or allergic reaction to capsaicin, hot peppers, other medicines, foods, dyes, or preservatives pregnant or trying to get pregnant breast-feeding How should I use this medication? This medicine is for external use only. It is applied by a health care professional in a hospital or clinic setting. Talk to your pediatrician regarding the use of this medicine in children. Special care may be needed. Overdosage: If you think you have taken too much of this medicine contact a poison control center or emergency room at once. NOTE: This medicine is only for you. Do not share this medicine with others. What if I miss a dose? This does not apply. What may interact with this medication? Interactions are not expected. Do not use any other skin products on the affected area without asking your doctor or health care professional. This list may not describe all possible interactions. Give your health care provider a list of all the medicines, herbs, non-prescription drugs, or dietary supplements you use. Also tell them if you smoke, drink alcohol, or use illegal drugs. Some items may interact with your medicine. What should I watch for while using this medication? Your condition will be monitored carefully while you are receiving this medicine. Your blood pressure may go up during the procedure. Do not touch the drug patch during treatment. This medicine causes red, burning skin. You may need  pain medicine for during and after the procedure. This medicine can make you more sensitive to heat for a few days after treatment. Be careful in hot showers or baths. Keep out of the sun. Exercise may make the treated skin feel hotter. Tell your doctor or healthcare professional if your symptoms do not start to get better or if they get worse. What side effects may I notice from receiving this medication? Side effects that you should report to your doctor or health care professional as soon as possible: allergic reactions like skin rash, itching or hives, swelling of the face, lips, or tongue burning pain, redness that does not go away changes in blood pressure cough or trouble breathing eye irritation skin sores or thinning Side effects that usually do not require medical attention (report to your doctor or health care professional if they continue or are bothersome): bruising dry skin nausea unusual body smell This list may not describe all possible side effects. Call your doctor for medical advice about side effects. You may report side effects to FDA at 1-800-FDA-1088. Where should I keep my medication? This drug is given in a hospital or clinic and will not be stored at home. NOTE: This sheet is a summary. It may not cover all possible information. If you have questions about this medicine, talk to your doctor, pharmacist, or health care provider.  2022 Elsevier/Gold Standard (2019-07-25 00:00:00) ____________________________________________________________________________________________  Medication Rules  Purpose: To inform patients, and their family members, of our rules and regulations.  Applies to: All patients receiving prescriptions (written or electronic).  Pharmacy of record: Pharmacy where electronic prescriptions will be  sent. If written prescriptions are taken to a different pharmacy, please inform the nursing staff. The pharmacy listed in the electronic medical record  should be the one where you would like electronic prescriptions to be sent.  Electronic prescriptions: In compliance with the [place] (STOP) Act of 2017 (Session Lanny Cramp [phone]), effective December 21, 2018, all controlled substances must be electronically prescribed. Calling prescriptions to the pharmacy will cease to exist.  Prescription refills: Only during scheduled appointments. Applies to all prescriptions.  NOTE: The following applies primarily to controlled substances (Opioid* Pain Medications).   Type of encounter (visit): For patients receiving controlled substances, face-to-face visits are required. (Not an option or up to the patient.)  Patient's responsibilities: Pain Pills: Bring all pain pills to every appointment (except for procedure appointments). Pill Bottles: Bring pills in original pharmacy bottle. Always bring the newest bottle. Bring bottle, even if empty. Medication refills: You are responsible for knowing and keeping track of what medications you take and those you need refilled. The day before your appointment: write a list of all prescriptions that need to be refilled. The day of the appointment: give the list to the admitting nurse. Prescriptions will be written only during appointments. No prescriptions will be written on procedure days. If you forget a medication: it will not be "Called in", "Faxed", or "electronically sent". You will need to get another appointment to get these prescribed. No early refills. Do not call asking to have your prescription filled early. Prescription Accuracy: You are responsible for carefully inspecting your prescriptions before leaving our office. Have the discharge nurse carefully go over each prescription with you, before taking them home. Make sure that your name is accurately spelled, that your address is correct. Check the name and dose of your medication to make sure it is accurate. Check  the number of pills, and the written instructions to make sure they are clear and accurate. Make sure that you are given enough medication to last until your next medication refill appointment. Taking Medication: Take medication as prescribed. When it comes to controlled substances, taking less pills or less frequently than prescribed is permitted and encouraged. Never take more pills than instructed. Never take medication more frequently than prescribed.  Inform other Doctors: Always inform, all of your healthcare providers, of all the medications you take. Pain Medication from other Providers: You are not allowed to accept any additional pain medication from any other Doctor or Healthcare provider. There are two exceptions to this rule. (see below) In the event that you require additional pain medication, you are responsible for notifying us, as stated below. Cough Medicine: Often these contain an opioid, such as codeine or hydrocodone. Never accept or take cough medicine containing these opioids if you are already taking an opioid* medication. The combination may cause respiratory failure and death. Medication Agreement: You are responsible for carefully reading and following our Medication Agreement. This must be signed before receiving any prescriptions from our practice. Safely store a copy of your signed Agreement. Violations to the Agreement will result in no further prescriptions. (Additional copies of our Medication Agreement are available upon request.) Laws, Rules, & Regulations: All patients are expected to follow all Federal and [employer], [employer], Rules, [employer] [facility]. Ignorance of the Laws does not constitute a valid excuse.  Illegal drugs and Controlled Substances: The use of illegal substances (including, but not limited to marijuana and its derivatives) and/or the illegal use of any controlled substances is strictly  prohibited. Violation of this rule may result in the immediate and  permanent discontinuation of any and all prescriptions being written by our practice. The use of any illegal substances is prohibited. Adopted CDC guidelines & recommendations: Target dosing levels will be at or below 60 MME/day. Use of benzodiazepines** is not recommended.  Exceptions: There are only two exceptions to the rule of not receiving pain medications from other Healthcare Providers. Exception #1 (Emergencies): In the event of an emergency (i.e.: accident requiring emergency care), you are allowed to receive additional pain medication. However, you are responsible for: As soon as you are able, call our office [phone], at any time of the day or night, and leave a message stating your name, the date and nature of the emergency, and the name and dose of the medication prescribed. In the event that your call is answered by a member of our staff, make sure to document and save the date, time, and the name of the person that took your information.  Exception #2 (Planned Surgery): In the event that you are scheduled by another doctor or dentist to have any type of surgery or procedure, you are allowed (for a period no longer than 30 days), to receive additional pain medication, for the acute post-op pain. However, in this case, you are responsible for picking up a copy of our "Post-op Pain Management for Surgeons" handout, and giving it to your surgeon or dentist. This document is available at our office, and does not require an appointment to obtain it. Simply go to our office during business hours (Monday-Thursday from 8:00 AM to 4:00 PM) (Friday 8:00 AM to 12:00 Noon) or if you have a scheduled appointment with [place], prior to your surgery, and ask for it by name. In addition, you are responsible for: calling our office [phone], at any time of the day or night, and leaving a message stating your name, name of your surgeon, type of surgery, and date of procedure or surgery. Failure to comply  with your responsibilities may result in termination of therapy involving the controlled substances. Medication Agreement Violation. Following the above rules, including your responsibilities will help you in avoiding a Medication Agreement Violation (Breaking your Pain Medication Contract).  *Opioid medications include: morphine, codeine, oxycodone, oxymorphone, hydrocodone, hydromorphone, meperidine, tramadol, tapentadol, buprenorphine, fentanyl, methadone. **Benzodiazepine medications include: diazepam (Valium), alprazolam (Xanax), clonazepam (Klonopine), lorazepam (Ativan), clorazepate (Tranxene), chlordiazepoxide (Librium), estazolam (Prosom), oxazepam (Serax), temazepam (Restoril), triazolam (Halcion) (Last updated: 09/17/2021) ____________________________________________________________________________________________  ____________________________________________________________________________________________  Medication Recommendations and Reminders  Applies to: All patients receiving prescriptions (written and/or electronic).  Medication Rules & Regulations: These rules and regulations exist for your safety and that of others. They are not flexible and neither are we. Dismissing or ignoring them will be considered "non-compliance" with medication therapy, resulting in complete and irreversible termination of such therapy. (See document titled "Medication Rules" for more details.) In all conscience, because of safety reasons, we cannot continue providing a therapy where the patient does not follow instructions.  Pharmacy of record:  Definition: This is the pharmacy where your electronic prescriptions will be sent.  We do not endorse any particular pharmacy, however, we have experienced problems with [facility] not securing enough medication supply for the community. We do not restrict you in your choice of pharmacy. However, once we write for your prescriptions, we will NOT be re-sending  more prescriptions to fix restricted supply problems created by your pharmacy, or your insurance.  The pharmacy listed in the  electronic medical record should be the one where you want electronic prescriptions to be sent. If you choose to change pharmacy, simply notify our nursing staff.  Recommendations: Keep all of your pain medications in a safe place, under lock and key, even if you live alone. We will NOT replace lost, stolen, or damaged medication. After you fill your prescription, take 1 week's worth of pills and put them away in a safe place. You should keep a separate, properly labeled bottle for this purpose. The remainder should be kept in the original bottle. Use this as your primary supply, until it runs out. Once it's gone, then you know that you have 1 week's worth of medicine, and it is time to come in for a prescription refill. If you do this correctly, it is unlikely that you will ever run out of medicine. To make sure that the above recommendation works, it is very important that you make sure your medication refill appointments are scheduled at least 1 week before you run out of medicine. To do this in an effective manner, make sure that you do not leave the office without scheduling your next medication management appointment. Always ask the nursing staff to show you in your prescription , when your medication will be running out. Then arrange for the receptionist to get you a return appointment, at least 7 days before you run out of medicine. Do not wait until you have 1 or 2 pills left, to come in. This is very poor planning and does not take into consideration that we may need to cancel appointments due to bad weather, sickness, or emergencies affecting our staff. DO NOT ACCEPT A "Partial Fill": If for any reason your pharmacy does not have enough pills/tablets to completely fill or refill your prescription, do not allow for a "partial fill". The law allows the pharmacy to complete  that prescription within 72 hours, without requiring a new prescription. If they do not fill the rest of your prescription within those 72 hours, you will need a separate prescription to fill the remaining amount, which we will NOT provide. If the reason for the partial fill is your insurance, you will need to talk to the pharmacist about payment alternatives for the remaining tablets, but again, DO NOT ACCEPT A PARTIAL FILL, unless you can trust your pharmacist to obtain the remainder of the pills within 72 hours.  Prescription refills and/or changes in medication(s):  Prescription refills, and/or changes in dose or medication, will be conducted only during scheduled medication management appointments. (Applies to both, written and electronic prescriptions.) No refills on procedure days. No medication will be changed or started on procedure days. No changes, adjustments, and/or refills will be conducted on a procedure day. Doing so will interfere with the diagnostic portion of the procedure. No phone refills. No medications will be "called into the pharmacy". No Fax refills. No weekend refills. No Holliday refills. No after hours refills.  Remember:  Business hours are:  Monday to Thursday 8:00 AM to 4:00 PM Provider's Schedule: Milinda Pointer, MD - Appointments are:  Medication management: Monday and Wednesday 8:00 AM to 4:00 PM Procedure day: Tuesday and Thursday 7:30 AM to 4:00 PM Gillis Santa, MD - Appointments are:  Medication management: Tuesday and Thursday 8:00 AM to 4:00 PM Procedure day: Monday and Wednesday 7:30 AM to 4:00 PM (Last update: 07/10/2020) ____________________________________________________________________________________________  ____________________________________________________________________________________________  CBD (cannabidiol) & Delta-8 (Delta-8 tetrahydrocannabinol) WARNING  Intro: Cannabidiol (CBD) and tetrahydrocannabinol (THC), are two natural  compounds found in plants of the Cannabis genus. They can both be extracted from hemp or cannabis. Hemp and cannabis come from the Cannabis sativa plant. Both compounds interact with your bodys endocannabinoid system, but they have very different effects. CBD does not produce the high sensation associated with cannabis. Delta-8 tetrahydrocannabinol, also known as delta-8 THC, is a psychoactive substance found in the Cannabis sativa plant, of which marijuana and hemp are two varieties. THC is responsible for the high associated with the illicit use of marijuana.  Applicable to: All individuals currently taking or considering taking CBD (cannabidiol) and, more important, all patients taking opioid analgesic controlled substances (pain medication). (Example: oxycodone; oxymorphone; hydrocodone; hydromorphone; morphine; methadone; tramadol; tapentadol; fentanyl; buprenorphine; butorphanol; dextromethorphan; meperidine; codeine; etc.)  Legal status: CBD remains a Schedule I drug prohibited for any use. CBD is illegal with one exception. In the [place], CBD has a limited Transport planner (FDA) approval for the treatment of two specific types of epilepsy disorders. Only one CBD product has been approved by the FDA for this purpose: "Epidiolex". FDA is aware that some companies are marketing products containing cannabis and cannabis-derived compounds in ways that violate the [employer], Drug and Cosmetic Act [facility] Act) and that may put the health and safety of consumers at risk. The FDA, a Federal agency, has not enforced the CBD status since 2018.   Legality: Some manufacturers ship CBD products nationally, which is illegal. Often such products are sold online and are therefore available throughout the country. CBD is openly sold in head shops and health food stores in some states where such sales have not been explicitly legalized. Selling unapproved products with unsubstantiated therapeutic  claims is not only a violation of the law, but also can put patients at risk, as these products have not been proven to be safe or effective. Federal illegality makes it difficult to conduct research on CBD.  Reference: "FDA Regulation of Cannabis and Cannabis-Derived Products, Including Cannabidiol (CBD)" - [employer]  Warning: CBD is not FDA approved and has not undergo the same manufacturing controls as prescription drugs.  This means that the purity and safety of available CBD may be questionable. Most of the time, despite manufacturer's claims, it is contaminated with THC (delta-9-tetrahydrocannabinol - the chemical in marijuana responsible for the "HIGH").  When this is the case, the [facility] contaminant will trigger a positive urine drug screen (UDS) test for Marijuana (carboxy-THC). Because a positive UDS for any illicit substance is a violation of our medication agreement, your opioid analgesics (pain medicine) may be permanently discontinued. The FDA recently put out a warning about 5 things that everyone should be aware of regarding Delta-8 THC: Delta-8 THC products have not been evaluated or approved by the FDA for safe use and may be marketed in ways that put the public health at risk. The FDA has received adverse event reports involving delta-8 THC-containing products. Delta-8 THC has psychoactive and intoxicating effects. Delta-8 THC manufacturing often involve use of potentially harmful chemicals to create the concentrations of delta-8 THC claimed in the marketplace. The final delta-8 THC product may have potentially harmful by-products (contaminants) due to the chemicals used in the process. Manufacturing of delta-8 THC products may occur in uncontrolled or unsanitary settings, which may lead to the presence of unsafe contaminants or other potentially harmful  substances. Delta-8 THC products should be kept out of the reach of children and pets.  MORE ABOUT CBD  General Information: CBD was discovered in [birth year]  and it is a derivative of the cannabis sativa genus plants (Marijuana and Hemp). It is one of the 113 identified substances found in Marijuana. It accounts for up to 40% of the plant's extract. As of 2018, preliminary clinical studies on CBD included research for the treatment of anxiety, movement disorders, and pain. CBD is available and consumed in multiple forms, including inhalation of smoke or vapor, as an aerosol spray, and by mouth. It may be supplied as an oil containing CBD, capsules, dried cannabis, or as a liquid solution. CBD is thought not to be as psychoactive as THC (delta-9-tetrahydrocannabinol - the chemical in marijuana responsible for the "HIGH"). Studies suggest that CBD may interact with different biological target receptors in the body, including cannabinoid and other neurotransmitter receptors. As of 2018 the mechanism of action for its biological effects has not been determined.  Side-effects   Adverse reactions: Dry mouth, diarrhea, decreased appetite, fatigue, drowsiness, malaise, weakness, sleep disturbances, and others.  Drug interactions: CBC may interact with other medications such as blood-thinners. (Last update: 09/19/2021) ____________________________________________________________________________________________  ____________________________________________________________________________________________  Drug Holidays (Slow)  What is a "Drug Holiday"? Drug Holiday: is the name given to the period of time during which a patient stops taking a medication(s) for the purpose of eliminating tolerance to the drug.  Benefits Improved effectiveness of opioids. Decreased opioid dose needed to achieve benefits. Improved pain with lesser dose.  What is tolerance? Tolerance: is the progressive decreased in  effectiveness of a drug due to its repetitive use. With repetitive use, the body gets use to the medication and as a consequence, it loses its effectiveness. This is a common problem seen with opioid pain medications. As a result, a larger dose of the drug is needed to achieve the same effect that used to be obtained with a smaller dose.  How long should a "Drug Holiday" last? You should stay off of the pain medicine for at least 14 consecutive days. (2 weeks)  Should I stop the medicine "cold [place]"? No. You should always coordinate with your Pain Specialist so that he/she can provide you with the correct medication dose to make the transition as smoothly as possible.  How do I stop the medicine? Slowly. You will be instructed to decrease the daily amount of pills that you take by one (1) pill every seven (7) days. This is called a "slow downward taper" of your dose. For example: if you normally take four (4) pills per day, you will be asked to drop this dose to three (3) pills per day for seven (7) days, then to two (2) pills per day for seven (7) days, then to one (1) per day for seven (7) days, and at the end of those last seven (7) days, this is when the "Drug Holiday" would start.   Will I have withdrawals? By doing a "slow downward taper" like this one, it is unlikely that you will experience any significant withdrawal symptoms. Typically, what triggers withdrawals is the sudden stop of a high dose opioid therapy. Withdrawals can usually be avoided by slowly decreasing the dose over a prolonged period of time. If you do not follow these instructions and decide to stop your medication abruptly, withdrawals may be possible.  What are withdrawals? Withdrawals: refers to the wide range of symptoms that occur after stopping or dramatically reducing opiate drugs after heavy and prolonged use. Withdrawal symptoms do not occur to patients that use low dose opioids, or those who take the medication  sporadically. Contrary to benzodiazepine (example: Valium, Xanax, etc.) or alcohol withdrawals (Delirium Tremens), opioid withdrawals are not lethal. Withdrawals are the physical manifestation of the body getting rid of the excess receptors.  Expected Symptoms Early symptoms of withdrawal may include: Agitation Anxiety Muscle aches Increased tearing Insomnia Runny nose Sweating Yawning  Late symptoms of withdrawal may include: Abdominal cramping Diarrhea Dilated pupils Goose bumps Nausea Vomiting  Will I experience withdrawals? Due to the slow nature of the taper, it is very unlikely that you will experience any.  What is a slow taper? Taper: refers to the gradual decrease in dose.  (Last update: 07/10/2020) ____________________________________________________________________________________________

## 2022-01-12 NOTE — Telephone Encounter (Signed)
Prescription for Percocet from [facility] cancelled by Joellen Jersey at [facility] per Dr Dossie Arbour order.

## 2022-01-20 ENCOUNTER — Telehealth: Payer: Self-pay

## 2022-01-20 NOTE — Telephone Encounter (Signed)
Per Dr. Adalberto Cole notes, he is not currently taking new patients for medication management, but Dr. Holley Raring does. I spoke with Blanch Media about this.

## 2022-01-20 NOTE — Telephone Encounter (Signed)
I need some guidance on this one. Dr. Dossie Arbour saw this patient on 01/12/22. He ordered qutenza but in the check out he said to follow up with Dr. Holley Raring for a 40 minute eval visit. Nothing was said about the qutenza or who was going to do the procedure etc. I dont know if I should schedule it or with whom.  Here is what the check out comments said:  Return for (43min), Eval-day (Dr. Holley Raring), (F2F), (MM) w/ Dr. Holley Raring.

## 2022-01-26 ENCOUNTER — Other Ambulatory Visit: Payer: Self-pay

## 2022-01-26 ENCOUNTER — Ambulatory Visit (INDEPENDENT_AMBULATORY_CARE_PROVIDER_SITE_OTHER): Payer: BLUE CROSS/BLUE SHIELD | Admitting: Family Medicine

## 2022-01-26 ENCOUNTER — Encounter: Payer: Self-pay | Admitting: Family Medicine

## 2022-01-26 VITALS — BP 107/73 | HR 93 | Temp 98.3°F | Resp 16 | Wt 230.0 lb

## 2022-01-26 DIAGNOSIS — Z1231 Encounter for screening mammogram for malignant neoplasm of breast: Secondary | ICD-10-CM

## 2022-01-26 DIAGNOSIS — M79672 Pain in left foot: Secondary | ICD-10-CM

## 2022-01-26 DIAGNOSIS — Z1211 Encounter for screening for malignant neoplasm of colon: Secondary | ICD-10-CM | POA: Diagnosis not present

## 2022-01-26 DIAGNOSIS — G8929 Other chronic pain: Secondary | ICD-10-CM

## 2022-01-26 DIAGNOSIS — E1129 Type 2 diabetes mellitus with other diabetic kidney complication: Secondary | ICD-10-CM

## 2022-01-26 DIAGNOSIS — M79671 Pain in right foot: Secondary | ICD-10-CM | POA: Diagnosis not present

## 2022-01-26 DIAGNOSIS — R809 Proteinuria, unspecified: Secondary | ICD-10-CM

## 2022-01-26 NOTE — Assessment & Plan Note (Signed)
Repeat A1c Refer to podiatry given blood blister complaint and need for DM foot exam- in hiking boots today and without spouse, did not want to undress footwear

## 2022-01-26 NOTE — Progress Notes (Signed)
Established patient visit   Patient: Gabriella Marsh   DOB: March 25, 1966   56 y.o. Female  MRN: [id number] Visit Date: 01/26/2022  Today's healthcare provider: [name], FNP   Chief Complaint  Patient presents with   Diabetes   I,Sulibeya S Dimas,acting as a scribe for [name], FNP.,have documented all relevant documentation on the behalf of [name], FNP,as directed by  [name], FNP while in the presence of [name], FNP.  Subjective    HPI  Diabetes Mellitus Type II, Follow-up  Lab Results  Component Value Date   HGBA1C 9.2 (A) 10/22/2021   HGBA1C 9.4 (H) 03/05/2021   HGBA1C 8.9 (H) 01/03/2021   Wt Readings from Last 3 Encounters:  01/26/22 230 lb (104.3 kg)  01/12/22 220 lb (99.8 kg)  01/09/22 220 lb (99.8 kg)   Last seen for diabetes 3 months ago.  Management since then includes no changes. She reports excellent compliance with treatment. She is not having side effects.  Symptoms: No fatigue No foot ulcerations  No appetite changes No nausea  Yes paresthesia of the feet  No polydipsia  No polyuria No visual disturbances   No vomiting     Home blood sugar records:  not being checked  Episodes of hypoglycemia? No    Current insulin regiment: none Most Recent Eye Exam:  Current exercise: none Current diet habits: in general, a "healthy" diet    Pertinent Labs: Lab Results  Component Value Date   CHOL 150 01/03/2021   HDL 38 (L) 01/03/2021   LDLCALC 64 01/03/2021   TRIG 307 (H) 01/03/2021   CHOLHDL 5.9 (H) 01/23/2019   Lab Results  Component Value Date   NA 135 11/12/2021   K 4.2 11/12/2021   CREATININE 0.71 11/12/2021   EGFR 101 11/12/2021   MICROALBUR 50 10/21/2017   LABMICR 28.4 05/22/2020     ---------------------------------------------------------------------------------------------------   Medications: Outpatient Medications Prior to Visit  Medication Sig   atorvastatin (LIPITOR) 20 MG tablet Take  1 tablet (20 mg total) by mouth daily.   Cholecalciferol (VITAMIN D3) 125 MCG (5000 UT) CAPS Take 1 capsule (5,000 Units total) by mouth daily with breakfast. Take along with calcium and magnesium.   clopidogrel (PLAVIX) 75 MG tablet TAKE 1 TABLET(75 MG) BY MOUTH DAILY   cyclobenzaprine (FLEXERIL) 5 MG tablet Take 1 tablet (5 mg total) by mouth 3 (three) times daily as needed for muscle spasms.   ergocalciferol (VITAMIN D2) 1.25 MG (50000 UT) capsule Take 1 capsule (50,000 Units total) by mouth 2 (two) times a week. X 6 weeks.   fluticasone (FLONASE) 50 MCG/ACT nasal spray Place 2 sprays into both nostrils daily.   furosemide (LASIX) 20 MG tablet TAKE 2 TABLETS(40 MG) BY MOUTH DAILY AS NEEDED FOR FLUID RETENTION OR SWELLING   gabapentin (NEURONTIN) 100 MG capsule TAKE 2 CAPSULES THREE TIMES A DAY ALONG WITH 600 MG THREE TIMES A DAY FOR A TOTAL DAILY DOSE OF 2400 MG DAILY   gabapentin (NEURONTIN) 600 MG tablet TAKE 1 TABLET BY MOUTH 3  TIMES DAILY   glipiZIDE (GLUCOTROL) 10 MG tablet TAKE ONE-HALF TABLET BY  MOUTH TWICE DAILY BEFORE  MEALS   levothyroxine (SYNTHROID) 150 MCG tablet Take 1 tablet (150 mcg total) by mouth daily at 6 (six) AM.   lisinopril (ZESTRIL) 5 MG tablet TAKE 1 TABLET(5 MG) BY MOUTH DAILY   Melatonin 10 MG TABS Take 10 mg by mouth at bedtime.  metFORMIN (GLUCOPHAGE) 500 MG tablet Take 2 tablets (1,000 mg total) by mouth 2 (two) times daily with a meal.   nortriptyline (PAMELOR) 25 MG capsule TAKE 2 CAPSULES BY MOUTH AT BEDTIME   omeprazole (PRILOSEC) 20 MG capsule Take 1 capsule (20 mg total) by mouth daily.   oxyCODONE-acetaminophen (PERCOCET) 5-325 MG tablet Take 1 tablet by mouth every 6 (six) hours as needed for severe pain. Must last 30 days.   traZODone (DESYREL) 50 MG tablet Take 2 tablets (100 mg total) by mouth at bedtime.   triamterene-hydrochlorothiazide (MAXZIDE-25) 37.5-25 MG tablet TAKE 1 TABLET BY MOUTH  DAILY   TRULICITY 1.55 YO/3.7CH SOPN ADMINISTER 1.5 MG  UNDER THE SKIN 1 TIME A WEEK   [DISCONTINUED] amoxicillin-clavulanate (AUGMENTIN) 875-125 MG tablet Take 1 tablet by mouth 2 (two) times daily.   [DISCONTINUED] predniSONE (DELTASONE) 10 MG tablet Take 1 tablet (10 mg total) by mouth daily with breakfast. (Patient not taking: Reported on 01/26/2022)   No facility-administered medications prior to visit.    Review of Systems      Objective    BP 107/73 (BP Location: Left Arm, Patient Position: Sitting, Cuff Size: Large)    Pulse 93    Temp 98.3 F (36.8 C) (Temporal)    Resp 16    Wt 230 lb (104.3 kg)    BMI 33.00 kg/m  BP Readings from Last 3 Encounters:  01/26/22 107/73  01/12/22 103/70  12/29/21 (!) 119/104   Wt Readings from Last 3 Encounters:  01/26/22 230 lb (104.3 kg)  01/12/22 220 lb (99.8 kg)  01/09/22 220 lb (99.8 kg)      Physical Exam Vitals and nursing note reviewed.  Constitutional:      General: She is not in acute distress.    Appearance: Normal appearance. She is obese. She is not ill-appearing, toxic-appearing or diaphoretic.  HENT:     Head: Normocephalic and atraumatic.  Cardiovascular:     Rate and Rhythm: Normal rate and regular rhythm.     Pulses: Normal pulses.     Heart sounds: Normal heart sounds. No murmur heard.   No friction rub. No gallop.  Pulmonary:     Effort: Pulmonary effort is normal. No respiratory distress.     Breath sounds: Normal breath sounds. No stridor. No wheezing, rhonchi or rales.  Chest:     Chest wall: No tenderness.  Abdominal:     General: Bowel sounds are normal.     Palpations: Abdomen is soft.  Musculoskeletal:        General: No swelling, tenderness, deformity or signs of injury. Normal range of motion.     Right lower leg: No edema.     Left lower leg: No edema.  Skin:    General: Skin is warm and dry.     Capillary Refill: Capillary refill takes less than 2 seconds.     Coloration: Skin is not jaundiced or pale.     Findings: No bruising, erythema, lesion or  rash.     Comments: Use of cane as ambulatory aid  Neurological:     General: No focal deficit present.     Mental Status: She is alert and oriented to person, place, and time. Mental status is at baseline.     Cranial Nerves: No cranial nerve deficit.     Sensory: No sensory deficit.     Motor: No weakness.     Coordination: Coordination normal.  Psychiatric:        Mood and  Affect: Mood normal.        Behavior: Behavior normal.        Thought Content: Thought content normal.        Judgment: Judgment normal.     No results found for any visits on 01/26/22.  Assessment & Plan     Problem List Items Addressed This Visit       Endocrine   Type 2 diabetes mellitus with microalbuminuria, without long-term current use of insulin (HCC) - Primary    Repeat A1c Refer to podiatry given blood blister complaint and need for DM foot exam- in hiking boots today and without spouse, did not want to undress footwear      Relevant Orders   Hemoglobin A1c   Ambulatory referral to Podiatry     Other   Chronic feet pain (3ry area of Pain) (Bilateral) (L>R) (Chronic)    Continue to recommend daily foot checks Referral for annual foot screening Concern for blood blister report, previous blue toe Referral placed to podiatry per pt request      Relevant Orders   Ambulatory referral to Podiatry   Colon cancer screening    Referral re-established      Relevant Orders   Ambulatory referral to Gastroenterology   Encounter for screening mammogram for malignant neoplasm of breast    Orders in; over due x2 years denies current complaints      Relevant Orders   MM 3D SCREEN BREAST BILATERAL     Return in about 3 months (around 04/25/2022) for chonic disease management.      Vonna Kotyk, FNP, have reviewed all documentation for this visit. The documentation on 01/26/22 for the exam, diagnosis, procedures, and orders are all accurate and complete.    [name], [place] [phone] (phone) [phone] (fax)  [place]

## 2022-01-26 NOTE — Assessment & Plan Note (Signed)
Referral re-established

## 2022-01-26 NOTE — Assessment & Plan Note (Signed)
Orders in; over due x2 years denies current complaints

## 2022-01-26 NOTE — Assessment & Plan Note (Signed)
Continue to recommend daily foot checks Referral for annual foot screening Concern for blood blister report, previous blue toe Referral placed to podiatry per pt request

## 2022-01-27 ENCOUNTER — Other Ambulatory Visit: Payer: Self-pay

## 2022-01-27 ENCOUNTER — Other Ambulatory Visit: Payer: Self-pay | Admitting: Family Medicine

## 2022-01-27 DIAGNOSIS — Z794 Long term (current) use of insulin: Secondary | ICD-10-CM

## 2022-01-27 DIAGNOSIS — Z1211 Encounter for screening for malignant neoplasm of colon: Secondary | ICD-10-CM

## 2022-01-27 DIAGNOSIS — E1165 Type 2 diabetes mellitus with hyperglycemia: Secondary | ICD-10-CM

## 2022-01-27 LAB — HEMOGLOBIN A1C
Est. average glucose Bld gHb Est-mCnc: 292 mg/dL
Hgb A1c MFr Bld: 11.8 % — ABNORMAL HIGH (ref 4.8–5.6)

## 2022-01-27 MED ORDER — INSULIN GLARGINE 100 UNIT/ML SOLOSTAR PEN: 20.0000 [IU] | PEN_INJECTOR | Freq: Every day | SUBCUTANEOUS | 1 refills | Status: DC

## 2022-01-27 MED ORDER — INSULIN PEN NEEDLE 31G X 5 MM MISC: 1.0000 | Freq: Every day | 1 refills | Status: DC

## 2022-01-27 NOTE — Progress Notes (Signed)
Patient stated dr t told her she would give her a prep for her colonoscopy since blood thinner got forgotten to be sent in so I put a clenpiq up front for patient

## 2022-01-27 NOTE — Progress Notes (Signed)
Gastroenterology Pre-Procedure Review  Request Date: 02/20/2022 Requesting Physician: Dr. Vicente Males  PATIENT REVIEW QUESTIONS: The patient responded to the following health history questions as indicated:    1. Are you having any GI issues? no 2. Do you have a personal history of Polyps? no 3. Do you have a family history of Colon Cancer or Polyps? Polyps  4. Diabetes Mellitus? yes (type 2 ) 5. Joint replacements in the past 12 months?no 6. Major health problems in the past 3 months?no 7. Any artificial heart valves, MVP, or defibrillator?no    MEDICATIONS & ALLERGIES:    Patient reports the following regarding taking any anticoagulation/antiplatelet therapy:   Plavix, Coumadin, Eliquis, Xarelto, Lovenox, Pradaxa, Brilinta, or Effient? yes (plavix) Aspirin? no  Patient confirms/reports the following medications:  Current Outpatient Medications  Medication Sig Dispense Refill   atorvastatin (LIPITOR) 20 MG tablet Take 1 tablet (20 mg total) by mouth daily. 90 tablet 3   Cholecalciferol (VITAMIN D3) 125 MCG (5000 UT) CAPS Take 1 capsule (5,000 Units total) by mouth daily with breakfast. Take along with calcium and magnesium. 30 capsule 2   clopidogrel (PLAVIX) 75 MG tablet TAKE 1 TABLET(75 MG) BY MOUTH DAILY 30 tablet 6   cyclobenzaprine (FLEXERIL) 5 MG tablet Take 1 tablet (5 mg total) by mouth 3 (three) times daily as needed for muscle spasms. 270 tablet 1   ergocalciferol (VITAMIN D2) 1.25 MG (50000 UT) capsule Take 1 capsule (50,000 Units total) by mouth 2 (two) times a week. X 6 weeks. 12 capsule 0   fluticasone (FLONASE) 50 MCG/ACT nasal spray Place 2 sprays into both nostrils daily. 16 g 6   furosemide (LASIX) 20 MG tablet TAKE 2 TABLETS(40 MG) BY MOUTH DAILY AS NEEDED FOR FLUID RETENTION OR SWELLING 180 tablet 3   gabapentin (NEURONTIN) 100 MG capsule TAKE 2 CAPSULES THREE TIMES A DAY ALONG WITH 600 MG THREE TIMES A DAY FOR A TOTAL DAILY DOSE OF 2400 MG DAILY 360 capsule 3    gabapentin (NEURONTIN) 600 MG tablet TAKE 1 TABLET BY MOUTH 3  TIMES DAILY 270 tablet 1   glipiZIDE (GLUCOTROL) 10 MG tablet TAKE ONE-HALF TABLET BY  MOUTH TWICE DAILY BEFORE  MEALS 90 tablet 3   levothyroxine (SYNTHROID) 150 MCG tablet Take 1 tablet (150 mcg total) by mouth daily at 6 (six) AM. 30 tablet 11   lisinopril (ZESTRIL) 5 MG tablet TAKE 1 TABLET(5 MG) BY MOUTH DAILY 90 tablet 3   Melatonin 10 MG TABS Take 10 mg by mouth at bedtime.     metFORMIN (GLUCOPHAGE) 500 MG tablet Take 2 tablets (1,000 mg total) by mouth 2 (two) times daily with a meal. 360 tablet 3   nortriptyline (PAMELOR) 25 MG capsule TAKE 2 CAPSULES BY MOUTH AT BEDTIME 180 capsule 1   omeprazole (PRILOSEC) 20 MG capsule Take 1 capsule (20 mg total) by mouth daily. 90 capsule 3   oxyCODONE-acetaminophen (PERCOCET) 5-325 MG tablet Take 1 tablet by mouth every 6 (six) hours as needed for severe pain. Must last 30 days. 120 tablet 0   traZODone (DESYREL) 50 MG tablet Take 2 tablets (100 mg total) by mouth at bedtime. 180 tablet 1   triamterene-hydrochlorothiazide (MAXZIDE-25) 37.5-25 MG tablet TAKE 1 TABLET BY MOUTH  DAILY 90 tablet 3   TRULICITY 1.5 WU/9.8JX SOPN ADMINISTER 1.5 MG UNDER THE SKIN 1 TIME A WEEK 2 mL 1   No current facility-administered medications for this visit.    Patient confirms/reports the following allergies:  Allergies  Allergen Reactions   Celecoxib Shortness Of Breath   Pregabalin Shortness Of Breath   Buspar [Buspirone] Other (See Comments)    Tachycardia   Citalopram Cough    No orders of the defined types were placed in this encounter.   AUTHORIZATION INFORMATION Primary Insurance: 1D#: Group #:  Secondary Insurance: 1D#: Group #:  SCHEDULE INFORMATION: Date: 02/20/2022 Time: Location:[facility]

## 2022-02-03 ENCOUNTER — Other Ambulatory Visit: Payer: Self-pay

## 2022-02-03 ENCOUNTER — Encounter: Payer: Self-pay | Admitting: Student in an Organized Health Care Education/Training Program

## 2022-02-03 ENCOUNTER — Ambulatory Visit
Payer: BLUE CROSS/BLUE SHIELD | Attending: Student in an Organized Health Care Education/Training Program | Admitting: Student in an Organized Health Care Education/Training Program

## 2022-02-03 VITALS — BP 109/77 | HR 93 | Temp 97.2°F | Resp 16 | Ht 70.0 in | Wt 235.0 lb

## 2022-02-03 DIAGNOSIS — M25512 Pain in left shoulder: Secondary | ICD-10-CM | POA: Insufficient documentation

## 2022-02-03 DIAGNOSIS — Z79891 Long term (current) use of opiate analgesic: Secondary | ICD-10-CM | POA: Insufficient documentation

## 2022-02-03 DIAGNOSIS — G894 Chronic pain syndrome: Secondary | ICD-10-CM | POA: Insufficient documentation

## 2022-02-03 DIAGNOSIS — M25561 Pain in right knee: Secondary | ICD-10-CM | POA: Insufficient documentation

## 2022-02-03 DIAGNOSIS — G608 Other hereditary and idiopathic neuropathies: Secondary | ICD-10-CM | POA: Insufficient documentation

## 2022-02-03 DIAGNOSIS — M545 Low back pain, unspecified: Secondary | ICD-10-CM | POA: Insufficient documentation

## 2022-02-03 DIAGNOSIS — G8929 Other chronic pain: Secondary | ICD-10-CM | POA: Diagnosis present

## 2022-02-03 DIAGNOSIS — M792 Neuralgia and neuritis, unspecified: Secondary | ICD-10-CM | POA: Insufficient documentation

## 2022-02-03 DIAGNOSIS — M25562 Pain in left knee: Secondary | ICD-10-CM | POA: Insufficient documentation

## 2022-02-03 DIAGNOSIS — E1142 Type 2 diabetes mellitus with diabetic polyneuropathy: Secondary | ICD-10-CM | POA: Diagnosis present

## 2022-02-03 DIAGNOSIS — Z8781 Personal history of (healed) traumatic fracture: Secondary | ICD-10-CM | POA: Diagnosis present

## 2022-02-03 DIAGNOSIS — M79671 Pain in right foot: Secondary | ICD-10-CM | POA: Insufficient documentation

## 2022-02-03 DIAGNOSIS — M79642 Pain in left hand: Secondary | ICD-10-CM | POA: Diagnosis present

## 2022-02-03 DIAGNOSIS — M79641 Pain in right hand: Secondary | ICD-10-CM | POA: Diagnosis present

## 2022-02-03 DIAGNOSIS — M4310 Spondylolisthesis, site unspecified: Secondary | ICD-10-CM | POA: Diagnosis present

## 2022-02-03 DIAGNOSIS — M47816 Spondylosis without myelopathy or radiculopathy, lumbar region: Secondary | ICD-10-CM | POA: Insufficient documentation

## 2022-02-03 DIAGNOSIS — Z79899 Other long term (current) drug therapy: Secondary | ICD-10-CM | POA: Insufficient documentation

## 2022-02-03 DIAGNOSIS — M79672 Pain in left foot: Secondary | ICD-10-CM | POA: Diagnosis present

## 2022-02-03 DIAGNOSIS — G64 Other disorders of peripheral nervous system: Secondary | ICD-10-CM | POA: Diagnosis present

## 2022-02-03 DIAGNOSIS — M170 Bilateral primary osteoarthritis of knee: Secondary | ICD-10-CM | POA: Insufficient documentation

## 2022-02-03 MED ORDER — OXYCODONE-ACETAMINOPHEN 5-325 MG PO TABS: 1.0000 | ORAL_TABLET | Freq: Two times a day (BID) | ORAL | 0 refills | Status: DC | PRN

## 2022-02-03 NOTE — Progress Notes (Signed)
Nursing Pain Medication Assessment:  Safety precautions to be maintained throughout the outpatient stay will include: orient to surroundings, keep bed in low position, maintain call bell within reach at all times, provide assistance with transfer out of bed and ambulation.  Medication Inspection Compliance: Pill count conducted under aseptic conditions, in front of the patient. Neither the pills nor the bottle was removed from the patient's sight at any time. Once count was completed pills were immediately returned to the patient in their original bottle.  Medication: Oxycodone/APAP Pill/Patch Count:  67 of 120 pills remain Pill/Patch Appearance: Markings consistent with prescribed medication Bottle Appearance: Standard pharmacy container. Clearly labeled. Filled Date: 01 / 23 / 2023 Last Medication intake:  Today

## 2022-02-03 NOTE — Progress Notes (Signed)
PROVIDER NOTE: Information contained herein reflects review and annotations entered in association with encounter. Interpretation of such information and data should be left to medically-trained personnel. Information provided to patient can be located elsewhere in the medical record under "Patient Instructions". Document created using STT-dictation technology, any transcriptional errors that may result from process are unintentional.    Patient: Gabriella Marsh  Service Category: E/M  Provider: Gillis Santa, MD  DOB: Jan 13, 1966  DOS: 02/03/2022  Specialty: Interventional Pain [facility]  MRN: [id number]  Setting: Ambulatory outpatient  PCP: [name], FNP  Type: Established Patient    Referring Provider: [name], FNP  Location: Office  Delivery: Face-to-face     HPI  Gabriella Marsh, a 56 y.o. year old female, is here today because of her Diabetic peripheral neuropathy ([place]) [E11.42]. Gabriella Marsh primary complain today is Hand Pain (biilateral) and Cold Feet (bilateral) Last encounter: My last encounter with her was on 01/12/2022 with my partner, Dr. Dossie Arbour Pertinent problems: Gabriella Marsh has Diabetic peripheral neuropathy ([place]); RLS (restless legs syndrome); Chronic low back pain (2ry area of Pain) (Bilateral) (L>R) w/o sciatica; DDD (degenerative disc disease), lumbosacral; Disorder of peripheral nervous system; Chronic pain syndrome; Pharmacologic therapy; Disorder of skeletal system; Problems influencing health status; Chronic hand pain (1ry area of Pain) (Bilateral) (L>R); Chronic feet pain (3ry area of Pain) (Bilateral) (L>R); Chronic shoulder pain (4th area of Pain) (Left); Chronic knee pain (5th area of Pain) (Bilateral) (L>R); Lumbar facet syndrome (Bilateral); Chronic sensorimotor polyneuropathy with axonal and demyelinating features; Osteoarthritis of knees (Bilateral); Tricompartment osteoarthritis of knees (Bilateral);  Grade 1 Retrolisthesis (23m) of L5 over S1;  Chronic peripheral neuropathic pain; and Type 2 diabetes mellitus with peripheral neuropathy (HCC) on their pertinent problem list. Pain Assessment: Severity of Chronic pain is reported as a 4 /10. Location: Hand Left, Right/ . Onset: More than a month ago. Quality: Shooting. Timing: Constant. Modifying factor(s): Gabapentin, Percocet. Vitals:  height is 5' 10"  (1.778 m) and weight is 235 lb (106.6 kg). Her temporal temperature is 97.2 F (36.2 C) (abnormal). Her blood pressure is 109/77 and her pulse is 93. Her respiration is 16 and oxygen saturation is 98%.   Reason for encounter: medication Marsh.   Patient presents today for refill of her Percocet 5 mg which she takes 2-3 times a day.  This is for severe diabetic peripheral neuropathy.  She was previously seen by my partner, Dr. NDossie Arbouron the last 2 occasions.  Unfortunately he is not able to take on any additional patients for medication Marsh as his practice is already full.  He has requested that I consider her case and after review of his detailed notes, I will take on Gabriella Marsh.  We discussed Qutenza for painful diabetic neuropathy as she does have painful paresthesias of both of her feet.  Risk and benefits of this procedure were discussed and patient would like to move forward with this.  Pharmacotherapy Assessment  Analgesic: Percocet 5 mg TID as needed, quantity 75/month   Monitoring: [place] PMP: PDMP reviewed during this encounter.       Pharmacotherapy: No side-effects or adverse reactions reported. Compliance: No problems identified. Effectiveness: Clinically acceptable.  WLandis Martins RN  02/03/2022  2:14 PM  Sign when Signing Visit Nursing Pain Medication Assessment:  Safety precautions to be maintained throughout the outpatient stay will include: orient to surroundings, keep bed in low position, maintain call bell within reach at all times, provide assistance  with transfer out of bed and  ambulation.  Medication Inspection Compliance: Pill count conducted under aseptic conditions, in front of the patient. Neither the pills nor the bottle was removed from the patient's sight at any time. Once count was completed pills were immediately returned to the patient in their original bottle.  Medication: Oxycodone/APAP Pill/Patch Count:  67 of 120 pills remain Pill/Patch Appearance: Markings consistent with prescribed medication Bottle Appearance: Standard pharmacy container. Clearly labeled. Filled Date: 01 / 23 / 2023 Last Medication intake:  Today     UDS:  Summary  Date Value Ref Range Status  11/12/2021 Note  Final    Comment:    ==================================================================== Compliance Drug Analysis, Ur ==================================================================== Test                             Result       Flag       Units  Drug Present and Declared for Prescription Verification   Oxycodone                      1136         EXPECTED   ng/mg creat   Oxymorphone                    1753         EXPECTED   ng/mg creat   Noroxycodone                   1215         EXPECTED   ng/mg creat   Noroxymorphone                 779          EXPECTED   ng/mg creat    Sources of oxycodone are scheduled prescription medications.    Oxymorphone, noroxycodone, and noroxymorphone are expected    metabolites of oxycodone. Oxymorphone is also available as a    scheduled prescription medication.    Gabapentin                     PRESENT      EXPECTED   Cyclobenzaprine                PRESENT      EXPECTED   Desmethylcyclobenzaprine       PRESENT      EXPECTED    Desmethylcyclobenzaprine is an expected metabolite of    cyclobenzaprine.    Nortriptyline                  PRESENT      EXPECTED    Nortriptyline may be administered as a prescription drug; it is also    an expected metabolite of amitriptyline.    Trazodone                      PRESENT       EXPECTED   1,3 chlorophenyl piperazine    PRESENT      EXPECTED    1,3-chlorophenyl piperazine is an expected metabolite of trazodone.    Acetaminophen                  PRESENT      EXPECTED ==================================================================== Test                      Result  Flag   Units      Ref Range   Creatinine              165              mg/dL      >=20 ==================================================================== Declared Medications:  The flagging and interpretation on this report are based on the  following declared medications.  Unexpected results may arise from  inaccuracies in the declared medications.   **Note: The testing scope of this panel includes these medications:   Cyclobenzaprine (Flexeril)  Gabapentin (Neurontin)  Nortriptyline (Pamelor)  Oxycodone (Percocet)  Trazodone (Desyrel)   **Note: The testing scope of this panel does not include small to  moderate amounts of these reported medications:   Acetaminophen (Percocet)   **Note: The testing scope of this panel does not include the  following reported medications:   Atorvastatin  Clopidogrel  Dulaglutide (Trulicity)  Glipizide (Glucotrol)  Hydrochlorothiazide  Levothyroxine  Lisinopril (Zestril)  Melatonin  Metformin (Glucophage)  Omeprazole  Triamterene ==================================================================== For clinical consultation, please call [phone]. ====================================================================      ROS  Constitutional: Denies any fever or chills Gastrointestinal: No reported hemesis, hematochezia, vomiting, or acute GI distress Musculoskeletal: Denies any acute onset joint swelling, redness, loss of ROM, or weakness Neurological:  Paresthesias of bilateral feet  Medication Review  Dulaglutide, Insulin Pen Needle, Melatonin, atorvastatin, clopidogrel, cyclobenzaprine, ergocalciferol, fluticasone, furosemide,  gabapentin, glipiZIDE, insulin glargine, levothyroxine, lisinopril, metFORMIN, nortriptyline, omeprazole, oxyCODONE-acetaminophen, traZODone, and triamterene-hydrochlorothiazide  History Review  Allergy: Ms. Huesman is allergic to celecoxib, pregabalin, buspar [buspirone], and citalopram. Drug: Ms. Retana  reports no history of drug use. Alcohol:  reports no history of alcohol use. Tobacco:  reports that she quit smoking about 10 years ago. Her smoking use included cigarettes. She has never used smokeless tobacco. Social: Ms. Echevarria  reports that she quit smoking about 10 years ago. Her smoking use included cigarettes. She has never used smokeless tobacco. She reports that she does not drink alcohol and does not use drugs. Medical:  has a past medical history of Arthritis, Blood clotting disorder ([place]), Degenerative disc disease, lumbar, Diabetes mellitus without complication ([place]), GERD (gastroesophageal reflux disease), Headache, Hyperlipidemia, Hypertension, Neuropathy, and Vertigo. Surgical: Ms. Waldren  has a past surgical history that includes Cesarean section; Shoulder surgery (Left, 12/26/014); Abdominal hysterectomy; Tonsillectomy and adenoidectomy; Uterine fibroid surgery; Ovarian cyst surgery; Cholecystectomy; left arm frature (10/12/2020); Thyroidectomy (N/A, 03/28/2021); Parathyroidectomy (03/28/2021); and Amputation toe (Right, 07/24/2021). Family: family history includes Congestive Heart Failure in her father; Diabetes in her father; Healthy in her brother; Heart disease in her father; Hyperlipidemia in her father; Hypertension in her father; Irritable bowel syndrome in her mother; Osteoporosis in her maternal grandmother and mother.  Laboratory Chemistry Profile   Renal Lab Results  Component Value Date   BUN 14 11/12/2021   CREATININE 0.71 11/12/2021   BCR 20 11/12/2021   GFR 111.18 06/05/2015   GFRAA 92 01/03/2021   GFRNONAA >60 07/25/2021    Hepatic Lab Results  Component  Value Date   AST 23 11/12/2021   ALT 12 07/24/2021   ALBUMIN 4.4 11/12/2021   ALKPHOS 75 11/12/2021   LIPASE 47 05/04/2015    Electrolytes Lab Results  Component Value Date   NA 135 11/12/2021   K 4.2 11/12/2021   CL 89 (L) 11/12/2021   CALCIUM 8.2 (L) 11/12/2021   MG 1.8 11/12/2021    Bone Lab Results  Component Value Date   25OHVITD1 14 (L) 11/12/2021  25OHVITD2 <1.0 11/12/2021   25OHVITD3 13 11/12/2021    Inflammation (CRP: Acute Phase) (ESR: Chronic Phase) Lab Results  Component Value Date   CRP 4 11/12/2021   ESRSEDRATE 39 11/12/2021   LATICACIDVEN 2.4 ([place]) 07/22/2021         Note: Above Lab results reviewed.  Recent Imaging Review  DG Shoulder Left CLINICAL DATA:  Shoulder pain  EXAM: LEFT SHOULDER - 2+ VIEW  COMPARISON:  10/12/2020  FINDINGS: No fracture or malalignment. Mild glenohumeral degenerative changes. Old fracture deformity of the proximal humerus.  IMPRESSION: No acute osseous abnormality.  Mild degenerative change  Electronically Signed   By: Donavan Foil M.D.   On: 11/12/2021 22:22 DG Knee Complete 4 Views Right CLINICAL DATA:  Knee pain  EXAM: RIGHT KNEE - COMPLETE 4+ VIEW  COMPARISON:  None.  FINDINGS: No fracture or malalignment. Mild tricompartment arthritis with trace knee effusion. Mild sclerosis in the proximal fibula may reflect small infarct or chondroid lesion.  IMPRESSION: No acute osseous abnormality.  Mild tricompartment arthritis  Electronically Signed   By: Donavan Foil M.D.   On: 11/12/2021 22:21 DG Lumbar Spine Complete W/Bend CLINICAL DATA:  Chronic back pain  EXAM: LUMBAR SPINE - COMPLETE WITH BENDING VIEWS  COMPARISON:  MRI 06/06/2015  FINDINGS: Mild levocurvature. Mild wedging of the L1 vertebral body suspect that this is chronic. 9 mm retrolisthesis L5 on S1 which does not significantly change between flexion or extension. Moderate disc space narrowing and degenerative change at L5-S1 with  mild degenerative change at L1-L2 and L2-L3. Aortic atherosclerosis.  IMPRESSION: 1. 9 mm retrolisthesis L5 on S1 with associated moderate degenerative changes 2. Mild degenerative changes elsewhere in the lumbar spine 3. Chronic appearing mild wedging of L1 vertebral body  Electronically Signed   By: Donavan Foil M.D.   On: 11/12/2021 22:20 DG Knee Complete 4 Views Left CLINICAL DATA:  Knee pain  EXAM: LEFT KNEE - COMPLETE 4+ VIEW  COMPARISON:  None.  FINDINGS: No fracture or malalignment. Mild tricompartment arthritis with trace effusion.  IMPRESSION: Mild tricompartment arthritis with trace knee effusion  Electronically Signed   By: Donavan Foil M.D.   On: 11/12/2021 22:17 Note: Reviewed        Physical Exam  General appearance: Well nourished, well developed, and well hydrated. In no apparent acute distress Mental status: Alert, oriented x 3 (person, place, & time)       Respiratory: No evidence of acute respiratory distress Eyes: PERLA Vitals: BP 109/77    Pulse 93    Temp (!) 97.2 F (36.2 C) (Temporal)    Resp 16    Ht 5' 10"  (1.778 m)    Wt 235 lb (106.6 kg)    SpO2 98%    BMI 33.72 kg/m  BMI: Estimated body mass index is 33.72 kg/m as calculated from the following:   Height as of this encounter: 5' 10"  (1.778 m).   Weight as of this encounter: 235 lb (106.6 kg). Ideal: Ideal body weight: 68.5 kg (151 lb 0.2 oz) Adjusted ideal body weight: 83.7 kg (184 lb 9.7 oz)  Lumbar Spine Area Exam  Skin & Axial Inspection: No masses, redness, or swelling Alignment: Symmetrical Functional ROM: Pain restricted ROM affecting both sides Stability: No instability detected Muscle Tone/Strength: Functionally intact. No obvious neuro-muscular anomalies detected. Sensory (Neurological): Neurogenic pain pattern Palpation: No palpable anomalies        Gait & Posture Assessment  Ambulation: Patient came in today in a wheel chair  Gait: Antalgic gait (limping) Posture:  Difficulty standing up straight, due to pain  Lower Extremity Exam    Side: Right lower extremity  Side: Left lower extremity  Stability: No instability observed          Stability: No instability observed          Skin & Extremity Inspection: Skin color, temperature, and hair growth are WNL. No peripheral edema or cyanosis. No masses, redness, swelling, asymmetry, or associated skin lesions. No contractures.  Skin & Extremity Inspection: Skin color, temperature, and hair growth are WNL. No peripheral edema or cyanosis. No masses, redness, swelling, asymmetry, or associated skin lesions. No contractures.  Functional ROM: Unrestricted ROM                  Functional ROM: Unrestricted ROM                  Muscle Tone/Strength: Functionally intact. No obvious neuro-muscular anomalies detected.  Muscle Tone/Strength: Functionally intact. No obvious neuro-muscular anomalies detected.  Sensory (Neurological): Neurogenic pain pattern        Sensory (Neurological): Neurogenic pain pattern        DTR: Patellar: deferred today Achilles: 0: absent Plantar: deferred today  DTR: Patellar: deferred today Achilles: 0: absent Plantar: deferred today  Palpation: No palpable anomalies  Palpation: No palpable anomalies    Assessment   Status Diagnosis  Persistent Persistent Persistent 1. Diabetic peripheral neuropathy ([place])   2. Chronic sensorimotor polyneuropathy with axonal and demyelinating features   3. Chronic peripheral neuropathic pain   4. Chronic shoulder pain (4th area of Pain) (Left)   5. History of proximal humerus fracture (Left)   6. Type 2 diabetes mellitus with peripheral neuropathy (HCC)   7. Tricompartment osteoarthritis of knees (Bilateral)   8. Chronic feet pain (3ry area of Pain) (Bilateral) (L>R)   9. Disorder of peripheral nervous system   10.  Grade 1 Retrolisthesis (16m) of L5 over S1   11. Lumbar facet syndrome (Bilateral)   12. Chronic pain syndrome   13. Chronic hand  pain (1ry area of Pain) (Bilateral) (L>R)   14. Chronic low back pain (2ry area of Pain) (Bilateral) (L>R) w/o sciatica   15. Chronic knee pain (5th area of Pain) (Bilateral) (L>R)   16. Osteoarthritis of knees (Bilateral)   17. Tricompartment osteoarthritis of knees (Bilateral)   18. Pharmacologic therapy   19. Chronic use of opiate for therapeutic purpose   20. Encounter for medication Marsh   21. Encounter for chronic pain Marsh      Updated Problems: Problem  Chronic sensorimotor polyneuropathy with axonal and demyelinating features   NCV & EMG Findings (DOS:02/07/2020): Extensive electrodiagnostic testing of the right upper extremity and additional studies of the left shows:  Bilateral median and radial sensory responses show reduced amplitude; additionally, the left median sensory response is mildly prolonged.  Bilateral ulnar sensory responses are absent. Bilateral median and ulnar motor responses reduced amplitude with slowed conduction velocity along the course of the nerve.  Additionally, bilateral median and left ulnar motor response shows prolonged latencies. Chronic motor axonal loss changes are seen affecting the intrinsic hand muscles, without accompanied active denervation.  Impression: The electrophysiologic findings are consistent with a chronic sensorimotor polyneuropathy, with axonal and demyelinating features.  Overall, these findings are moderate-to-severe in degree electrically.   Osteoarthritis of knees (Bilateral)   X-rays of the right knee show mild tricompartmental osteoarthritis.  X-rays of the left knee show mild tricompartmental osteoarthritis with  trace knee effusion.   Tricompartment osteoarthritis of knees (Bilateral)   X-rays of the right knee show mild tricompartmental osteoarthritis.  X-rays of the left knee show mild tricompartmental osteoarthritis with trace knee effusion.    Grade 1 Retrolisthesis (66m) of L5 over S1   Flexion and  extension views of the lumbar spine show a 9 mm retrolisthesis of L5 over S1 with associated moderate degenerative changes. The L5-S1 retrolisthesis seems to be stable.    Chronic Peripheral Neuropathic Pain  Type 2 Diabetes Mellitus With Peripheral Neuropathy (Hcc)  Pharmacologic Therapy  Disorder of Skeletal System  Problems Influencing Health Status  Chronic hand pain (1ry area of Pain) (Bilateral) (L>R)   Secondary to diabetic peripheral neuropathy   Chronic feet pain (3ry area of Pain) (Bilateral) (L>R)   Secondary to diabetic peripheral neuropathy   Chronic shoulder pain (4th area of Pain) (Left)   Aggravated after a fall that she suffered in 2021 where she broke her left arm.   Chronic knee pain (5th area of Pain) (Bilateral) (L>R)  Lumbar facet syndrome (Bilateral)  Chronic Pain Syndrome  Ddd (Degenerative Disc Disease), Lumbosacral  Disorder of Peripheral Nervous System  Diabetic Peripheral Neuropathy (Hcc)   Monofilament- No Feeling bilaterally   Rls (Restless Legs Syndrome)  Chronic low back pain (2ry area of Pain) (Bilateral) (L>R) w/o sciatica    Plan of Care   Ms. LShaila Gilchresthas a current medication list which includes the following long-term medication(s): atorvastatin, fluticasone, furosemide, gabapentin, gabapentin, glipizide, insulin glargine, levothyroxine, lisinopril, metformin, nortriptyline, omeprazole, trazodone, triamterene-hydrochlorothiazide, [START ON 02/24/2022] oxycodone-acetaminophen, and [START ON 03/26/2022] oxycodone-acetaminophen.  Pharmacotherapy (Medications Ordered): Meds ordered this encounter  Medications   oxyCODONE-acetaminophen (PERCOCET) 5-325 MG tablet    Sig: Take 1-2 tablets by mouth 2 (two) times daily as needed for severe pain. Must last 30 days.    Dispense:  75 tablet    Refill:  0    DO NOT: delete (not duplicate); no partial-fill (will deny script to complete), no refill request (F/U required). DISPENSE: 1 day early if  closed on fill date. WARN: No CNS-depressants within 8 hrs of med.   oxyCODONE-acetaminophen (PERCOCET) 5-325 MG tablet    Sig: Take 1-2 tablets by mouth 2 (two) times daily as needed for severe pain. Must last 30 days.    Dispense:  75 tablet    Refill:  0    DO NOT: delete (not duplicate); no partial-fill (will deny script to complete), no refill request (F/U required). DISPENSE: 1 day early if closed on fill date. WARN: No CNS-depressants within 8 hrs of med.   Orders:  Orders Placed This Encounter  Procedures   NEUROLYSIS    Please order Qutenza patches from pharmacy    Standing Status:   Future    Standing Expiration Date:   05/03/2022    Order Specific Question:   Where will this procedure be performed?    Answer:   [facility]     Consider lidocaine infusion in future.  Follow-up plan:   Return in about 3 months (around 04/23/2022) for Medication Marsh, in person.    Recent Visits Date Type Provider Dept  01/12/22 Office Visit NMilinda Pointer MD [facility]  11/12/21 Office Visit NMilinda Pointer MD [facility]  Showing recent visits within past 90 days and meeting all other requirements Today's Visits Date Type Provider Dept  02/03/22 Office Visit LGillis Santa MD [facility]  Showing today's visits and meeting all  other requirements Future Appointments Date Type Provider Dept  02/11/22 Appointment Gillis Santa, MD [facility]  04/21/22 Appointment Gillis Santa, MD [facility]  Showing future appointments within next 90 days and meeting all other requirements  I discussed the assessment and treatment plan with the patient. The patient was provided an opportunity to ask questions and all were answered. The patient agreed with the plan and demonstrated an understanding of the instructions.  Patient advised to call back or seek an in-person evaluation if the symptoms or condition worsens.  Duration  of encounter: 43mnutes.  Note by: BGillis Santa MD Date: 02/03/2022; Time: 3:50 PM

## 2022-02-09 ENCOUNTER — Telehealth: Payer: Self-pay

## 2022-02-09 NOTE — Telephone Encounter (Signed)
Resent blood thinner request

## 2022-02-10 ENCOUNTER — Telehealth: Payer: Self-pay

## 2022-02-10 NOTE — Telephone Encounter (Signed)
Blood thinner

## 2022-02-11 ENCOUNTER — Ambulatory Visit
Payer: BLUE CROSS/BLUE SHIELD | Attending: Student in an Organized Health Care Education/Training Program | Admitting: Student in an Organized Health Care Education/Training Program

## 2022-02-16 ENCOUNTER — Telehealth: Payer: Self-pay | Admitting: Family Medicine

## 2022-02-16 ENCOUNTER — Other Ambulatory Visit: Payer: Self-pay

## 2022-02-16 DIAGNOSIS — M62838 Other muscle spasm: Secondary | ICD-10-CM

## 2022-02-16 NOTE — Telephone Encounter (Signed)
[facility] pharmacy faxed refill request for the following medications:  cyclobenzaprine (FLEXERIL) 5 MG tablet   Please advise

## 2022-02-17 ENCOUNTER — Telehealth: Payer: Self-pay

## 2022-02-17 MED ORDER — CYCLOBENZAPRINE HCL 5 MG PO TABS: 5.0000 mg | ORAL_TABLET | Freq: Three times a day (TID) | ORAL | 1 refills | Status: DC | PRN

## 2022-02-17 NOTE — Telephone Encounter (Signed)
Called patient to reschedule still no answer for blood thinner patient stated she stopped her plavix two weeks ago on her own waiting to do the colonoscopy so I left it scheduled patient stated she will start plavix back after colonoscopy again patient stopped plavix on her own two weeks ago

## 2022-02-17 NOTE — Telephone Encounter (Signed)
Just received blood thinner clearance from DR Owens Shark states patient can stop her blood thinner 5 days prior to and restart 5 days after procedure called patient to tell her her she states she stopped 2 weeks ago her plavix and said  ok to clearance

## 2022-02-18 ENCOUNTER — Encounter: Payer: Self-pay | Admitting: Family Medicine

## 2022-02-19 ENCOUNTER — Encounter: Payer: Self-pay | Admitting: Gastroenterology

## 2022-02-20 ENCOUNTER — Ambulatory Visit: Payer: BLUE CROSS/BLUE SHIELD | Admitting: Anesthesiology

## 2022-02-20 ENCOUNTER — Encounter: Payer: Self-pay | Admitting: Gastroenterology

## 2022-02-20 ENCOUNTER — Other Ambulatory Visit: Payer: Self-pay

## 2022-02-20 ENCOUNTER — Encounter
Admission: RE | Disposition: A | Discharge status: Home / Self Care | Payer: Self-pay | Source: Home / Self Care | Attending: Gastroenterology

## 2022-02-20 ENCOUNTER — Ambulatory Visit
Admission: RE | Admit: 2022-02-20 | Discharge: 2022-02-20 | Disposition: A | Discharge status: Home / Self Care | Payer: BLUE CROSS/BLUE SHIELD | Attending: Gastroenterology | Admitting: Gastroenterology

## 2022-02-20 DIAGNOSIS — E1151 Type 2 diabetes mellitus with diabetic peripheral angiopathy without gangrene: Secondary | ICD-10-CM | POA: Diagnosis not present

## 2022-02-20 DIAGNOSIS — K219 Gastro-esophageal reflux disease without esophagitis: Secondary | ICD-10-CM | POA: Insufficient documentation

## 2022-02-20 DIAGNOSIS — K635 Polyp of colon: Secondary | ICD-10-CM

## 2022-02-20 DIAGNOSIS — I10 Essential (primary) hypertension: Secondary | ICD-10-CM | POA: Diagnosis not present

## 2022-02-20 DIAGNOSIS — Z87891 Personal history of nicotine dependence: Secondary | ICD-10-CM | POA: Diagnosis not present

## 2022-02-20 DIAGNOSIS — Z1211 Encounter for screening for malignant neoplasm of colon: Secondary | ICD-10-CM | POA: Insufficient documentation

## 2022-02-20 DIAGNOSIS — Z7984 Long term (current) use of oral hypoglycemic drugs: Secondary | ICD-10-CM | POA: Insufficient documentation

## 2022-02-20 DIAGNOSIS — Z6833 Body mass index (BMI) 33.0-33.9, adult: Secondary | ICD-10-CM | POA: Insufficient documentation

## 2022-02-20 DIAGNOSIS — M1990 Unspecified osteoarthritis, unspecified site: Secondary | ICD-10-CM | POA: Insufficient documentation

## 2022-02-20 DIAGNOSIS — D122 Benign neoplasm of ascending colon: Secondary | ICD-10-CM | POA: Diagnosis not present

## 2022-02-20 DIAGNOSIS — E1140 Type 2 diabetes mellitus with diabetic neuropathy, unspecified: Secondary | ICD-10-CM | POA: Diagnosis not present

## 2022-02-20 HISTORY — PX: COLONOSCOPY WITH PROPOFOL: SHX5780

## 2022-02-20 LAB — GLUCOSE, CAPILLARY: Glucose-Capillary: 362 mg/dL — ABNORMAL HIGH (ref 70–99)

## 2022-02-20 SURGERY — COLONOSCOPY WITH PROPOFOL
Anesthesia: General

## 2022-02-20 MED ORDER — PROPOFOL 10 MG/ML IV BOLUS
INTRAVENOUS | Status: AC
  Filled 2022-02-20: qty 20

## 2022-02-20 MED ORDER — PHENYLEPHRINE 40 MCG/ML (10ML) SYRINGE FOR IV PUSH (FOR BLOOD PRESSURE SUPPORT)
PREFILLED_SYRINGE | INTRAVENOUS | Status: DC | PRN
Start: 2022-02-20 — End: 2022-02-20
  Administered 2022-02-20: 120 ug via INTRAVENOUS
  Administered 2022-02-20 (×3): 80 ug via INTRAVENOUS
  Administered 2022-02-20: 40 ug via INTRAVENOUS

## 2022-02-20 MED ORDER — PROPOFOL 500 MG/50ML IV EMUL
INTRAVENOUS | Status: AC
  Filled 2022-02-20: qty 50

## 2022-02-20 MED ORDER — PROPOFOL 10 MG/ML IV BOLUS
INTRAVENOUS | Status: DC | PRN
  Administered 2022-02-20: 70 mg via INTRAVENOUS
  Administered 2022-02-20: 20 mg via INTRAVENOUS
  Administered 2022-02-20: 10 mg via INTRAVENOUS
  Administered 2022-02-20: 20 mg via INTRAVENOUS

## 2022-02-20 MED ORDER — LIDOCAINE HCL (PF) 2 % IJ SOLN
INTRAMUSCULAR | Status: AC
  Filled 2022-02-20: qty 5

## 2022-02-20 MED ORDER — PHENYLEPHRINE 40 MCG/ML (10ML) SYRINGE FOR IV PUSH (FOR BLOOD PRESSURE SUPPORT)
PREFILLED_SYRINGE | INTRAVENOUS | Status: AC
  Filled 2022-02-20: qty 10

## 2022-02-20 MED ORDER — PHENYLEPHRINE HCL (PRESSORS) 10 MG/ML IV SOLN
INTRAVENOUS | Status: DC | PRN
Start: 2022-02-20 — End: 2022-02-20

## 2022-02-20 MED ORDER — SODIUM CHLORIDE (PF) 0.9 % IJ SOLN
INTRAMUSCULAR | Status: DC | PRN
  Administered 2022-02-20: 8 mL via INTRAVENOUS

## 2022-02-20 MED ORDER — LIDOCAINE HCL (CARDIAC) PF 100 MG/5ML IV SOSY
PREFILLED_SYRINGE | INTRAVENOUS | Status: DC | PRN
Start: 2022-02-20 — End: 2022-02-20
  Administered 2022-02-20: 50 mg via INTRAVENOUS

## 2022-02-20 MED ORDER — PROPOFOL 500 MG/50ML IV EMUL
INTRAVENOUS | Status: DC | PRN
  Administered 2022-02-20: 100 ug/kg/min via INTRAVENOUS

## 2022-02-20 MED ORDER — SODIUM CHLORIDE 0.9 % IV SOLN: INTRAVENOUS | Status: DC

## 2022-02-20 NOTE — Op Note (Signed)
[facility] ?Gastroenterology ?Patient Name: Gabriella Marsh ?Procedure Date: 02/20/2022 6:48 AM ?MRN: [id number] ?Account #: [id number] ?Date of Birth: 07-25-1966 ?Admit Type: Outpatient ?Age: 56 ?Room: [facility] ENDO ROOM 4 ?Gender: Female ?Note Status: Finalized ?Instrument Name: Colonoscope [id number] ?Procedure:             Colonoscopy ?Indications:           Screening for colorectal malignant neoplasm ?Providers:             [name] MD, MD ?Referring MD:          Tally Joe NP ?Medicines:             Monitored Anesthesia Care ?Complications:         No immediate complications. ?Procedure:             Pre-Anesthesia Assessment: ?                       - Prior to the procedure, a History and Physical was  ?                       performed, and patient medications, allergies and  ?                       sensitivities were reviewed. The patient's tolerance  ?                       of previous anesthesia was reviewed. ?                       - The risks and benefits of the procedure and the  ?                       sedation options and risks were discussed with the  ?                       patient. All questions were answered and informed  ?                       consent was obtained. ?                       - ASA Grade Assessment: II - A patient with mild  ?                       systemic disease. ?                       - ASA Grade Assessment: II - A patient with mild  ?                       systemic disease. ?                       After obtaining informed consent, the colonoscope was  ?                       passed under direct vision. Throughout the procedure,  ?                       the patient's blood pressure, pulse, and  oxygen  ?                       saturations were monitored continuously. The  ?                       Colonoscope was introduced through the anus and  ?                       advanced to the the cecum, identified by the  ?                       appendiceal orifice. The patient  tolerated the  ?                       procedure well. The quality of the bowel preparation  ?                       was poor. The colonoscopy was extremely difficult due  ?                       to a tortuous colon. ?Findings: ?     The perianal and digital rectal examinations were normal. ?     A 7 mm polyp was found in the proximal ascending colon. The polyp was  ?     sessile. The polyp was removed with a cold snare. Resection and  ?     retrieval were complete. ?     A 30 mm polyp was found in the mid ascending colon. The polyp was  ?     sessile. Preparations were made for mucosal resection. Saline was  ?     injected to raise the lesion. Piecemeal mucosal resection using a snare  ?     was performed. Resection was complete, and retrieval was complete.  ?     Fulguration to ablate the lesion remnants by snare was successful. To  ?     prevent bleeding after the polypectomy, seven hemostatic clips were  ?     successfully placed. There was no bleeding during, or at the end, of the  ?     procedure. Borders of polyp marked using NBI or blue light ?     The exam was otherwise without abnormality. ?Impression:            - Preparation of the colon was poor. ?                       - One 7 mm polyp in the proximal ascending colon,  ?                       removed with a cold snare. Resected and retrieved. ?                       - One 30 mm polyp in the mid ascending colon, removed  ?                       with mucosal resection. Resected and retrieved.  ?                       Treated with a hot snare. Clips were placed. ?                       -  The examination was otherwise normal. ?                       - Mucosal resection was performed. Resection was  ?                       complete, and retrieval was complete. ?Recommendation:        - Discharge patient to home (with escort). ?                       - Resume previous diet. ?                       - Continue present medications. ?                       -  Await pathology results. ?                       - Repeat colonoscopy in 5 months because the bowel  ?                       preparation was suboptimal and for surveillance after  ?                       piecemeal polypectomy. ?                       - PRocedure took 57 minutes in comparison to a normal  ?                       15 minutes due to significant looping and large polyp  ?                       that requiredf multiple methods of resection ?Procedure Code(s):     --- Professional --- ?                       [phone], Colonoscopy, flexible; with endoscopic mucosal  ?                       resection ?                       60454, 59, Colonoscopy, flexible; with removal of  ?                       tumor(s), polyp(s), or other lesion(s) by snare  ?                       technique ?Diagnosis Code(s):     --- Professional --- ?                       Z12.11, Encounter for screening for malignant neoplasm  ?                       of colon ?                       K63.5, Polyp of colon ?CPT copyright 2019 [employer]. All rights reserved. ?The codes documented in  this report are preliminary and upon coder review may  ?be revised to meet current compliance requirements. ?[name], MD ?[name] MD, MD ?02/20/2022 8:51:53 AM ?This report has been signed electronically. ?Number of Addenda: 0 ?Note Initiated On: 02/20/2022 6:48 AM ?Scope Withdrawal Time: 0 hours 45 minutes 5 seconds  ?Total Procedure Duration: 0 hours 57 minutes 17 seconds  ?Estimated Blood Loss:  Estimated blood loss: none. ?     [facility] ?

## 2022-02-20 NOTE — Anesthesia Preprocedure Evaluation (Addendum)
Anesthesia Evaluation  ?Patient identified by MRN, date of birth, ID band ?Patient awake ? ? ? ?Reviewed: ?Allergy & Precautions, H&P , NPO status , Patient's Chart, lab work & pertinent test results, reviewed documented beta blocker date and time  ? ?Airway ?Mallampati: II ? ?TM Distance: >3 FB ?Neck ROM: full ? ? ? Dental ?no notable dental hx. ?(+) Teeth Intact ?  ?Pulmonary ?neg pulmonary ROS, neg shortness of breath, former smoker,  ?covid positive ?  ?Pulmonary exam normal ?breath sounds clear to auscultation ? ? ? ? ? ? Cardiovascular ?Exercise Tolerance: Poor ?hypertension, On Medications and Pt. on medications ?+ Peripheral Vascular Disease  ? ?Rhythm:regular Rate:Normal ? ? ?  ?Neuro/Psych ? Headaches, PSYCHIATRIC DISORDERS Anxiety Depression asensate to level of foot ? Neuromuscular disease (Neuropathy)   ? GI/Hepatic ?Neg liver ROS, GERD  Medicated and Controlled,  ?Endo/Other  ?diabetes, Poorly Controlled, Type 2, Oral Hypoglycemic Agents, Insulin DependentMorbid obesity ? Renal/GU ?  ? ?  ?Musculoskeletal ? ?(+) Arthritis ,  ? Abdominal ?(+) + obese,   ?Peds ? Hematology ?negative hematology ROS ?(+)   ?Anesthesia Other Findings ? ? Reproductive/Obstetrics ?negative OB ROS ? ?  ? ? ? ? ? ? ? ? ? ? ? ? ? ?  ?  ? ? ? ? ? ? ? ?Anesthesia Physical ? ?Anesthesia Plan ? ?ASA: 3 ? ?Anesthesia Plan: General  ? ?Post-op Pain Management:   ? ?Induction: Intravenous ? ?PONV Risk Score and Plan:  ? ?Airway Management Planned: Natural Airway and Simple Face Mask ? ?Additional Equipment:  ? ?Intra-op Plan:  ? ?Post-operative Plan:  ? ?Informed Consent: I have reviewed the patients History and Physical, chart, labs and discussed the procedure including the risks, benefits and alternatives for the proposed anesthesia with the patient or authorized representative who has indicated his/her understanding and acceptance.  ? ? ? ?Dental advisory given ? ?Plan Discussed with:  CRNA ? ?Anesthesia Plan Comments:   ? ? ? ? ? ?Anesthesia Quick Evaluation ? ?

## 2022-02-20 NOTE — Transfer of Care (Signed)
Immediate Anesthesia Transfer of Care Note ? ?Patient: Gabriella Marsh ? ?Procedure(s) Performed: COLONOSCOPY WITH PROPOFOL ? ?Patient Location: PACU ? ?Anesthesia Type:General ? ?Level of Consciousness: awake, alert  and sedated ? ?Airway & Oxygen Therapy: Patient Spontanous Breathing ? ?Post-op Assessment: Report given to RN and Post -op Vital signs reviewed and stable ? ?Post vital signs: Reviewed and stable ? ?Last Vitals:  ?Vitals Value Taken Time  ?BP 103/67 02/20/22 0856  ?Temp 35.6 ?C 02/20/22 0850  ?Pulse 83 02/20/22 0858  ?Resp 12 02/20/22 0858  ?SpO2 97 % 02/20/22 0858  ?Vitals shown include unvalidated device data. ? ?Last Pain:  ?Vitals:  ? 02/20/22 0850  ?TempSrc: Temporal  ?PainSc:   ?   ? ?  ? ?Complications: No notable events documented. ?

## 2022-02-20 NOTE — H&P (Signed)
? ? ? ?Gabriella Bellows, MD ?[address], [place], [place], [place], [address] ?[address] [place], [place], [place], [address] ?Phone: [phone]  ?Fax: [phone] ? ?Primary Care Physician:  [name], FNP ? ? ?Pre-Procedure History & Physical: ?HPI:  Gabriella Marsh is a 56 y.o. female is here for an colonoscopy. ?  ?Past Medical History:  ?Diagnosis Date  ? Arthritis   ? knees  ? Blood clotting disorder ([place])   ? on toe   ? Degenerative disc disease, lumbar   ? Diabetes mellitus without complication ([place])   ? GERD (gastroesophageal reflux disease)   ? Headache   ? daily - AM (has not been able to have SPG blocks lately)  ? Hyperlipidemia   ? Hypertension   ? Neuropathy   ? feet  ? Vertigo   ? 3-4x/yr  ? ? ?Past Surgical History:  ?Procedure Laterality Date  ? ABDOMINAL HYSTERECTOMY    ? But still has cervix  ? AMPUTATION TOE Right 07/24/2021  ? Procedure: AMPUTATION TOE;  Surgeon: Sharlotte Alamo, DPM;  Location: [facility] ORS;  Service: Podiatry;  Laterality: Right;  ? CESAREAN SECTION    ? CHOLECYSTECTOMY    ? left arm frature  10/12/2020  ? OVARIAN CYST SURGERY    ? PARATHYROIDECTOMY  03/28/2021  ? Procedure: PARATHYROIDECTOMY AUTOTRANSPLANT;  Surgeon: Fredirick Maudlin, MD;  Location: [facility] ORS;  Service: General;;  ? SHOULDER SURGERY Left 12/26/014  ? Dr. Little Ishikawa, [place]  ? THYROIDECTOMY N/A 03/28/2021  ? Procedure: THYROIDECTOMY, total;  Surgeon: Fredirick Maudlin, MD;  Location: [facility] ORS;  Service: General;  Laterality: N/A;  Provider requesting 3 hours /180 minutes for procedure  ? TONSILLECTOMY AND ADENOIDECTOMY    ? UTERINE FIBROID SURGERY    ? ? ?Prior to Admission medications   ?Medication Sig Start Date End Date Taking? Authorizing Provider  ?atorvastatin (LIPITOR) 20 MG tablet Take 1 tablet (20 mg total) by mouth daily. 07/17/21  Yes Bacigalupo, Dionne Bucy, MD  ?clopidogrel (PLAVIX) 75 MG tablet TAKE 1 TABLET(75 MG) BY MOUTH DAILY 10/03/21  Yes Kris Hartmann, NP  ?cyclobenzaprine (FLEXERIL) 5 MG  tablet Take 1 tablet (5 mg total) by mouth 3 (three) times daily as needed for muscle spasms. 02/17/22  Yes [name], FNP  ?ergocalciferol (VITAMIN D2) 1.25 MG (50000 UT) capsule Take 1 capsule (50,000 Units total) by mouth 2 (two) times a week. X 6 weeks. 01/12/22 02/23/22 Yes Milinda Pointer, MD  ?gabapentin (NEURONTIN) 100 MG capsule TAKE 2 CAPSULES THREE TIMES A DAY ALONG WITH 600 MG THREE TIMES A DAY FOR A TOTAL DAILY DOSE OF 2400 MG DAILY 07/17/21  Yes Bacigalupo, Dionne Bucy, MD  ?gabapentin (NEURONTIN) 600 MG tablet TAKE 1 TABLET BY MOUTH 3  TIMES DAILY 12/20/21  Yes [name], FNP  ?glipiZIDE (GLUCOTROL) 10 MG tablet TAKE ONE-HALF TABLET BY  MOUTH TWICE DAILY BEFORE  MEALS 12/26/21  Yes [name], FNP  ?levothyroxine (SYNTHROID) 150 MCG tablet Take 1 tablet (150 mcg total) by mouth daily at 6 (six) AM. 03/30/21  Yes Fredirick Maudlin, MD  ?lisinopril (ZESTRIL) 5 MG tablet TAKE 1 TABLET(5 MG) BY MOUTH DAILY 06/12/21  Yes Birdie Sons, MD  ?metFORMIN (GLUCOPHAGE) 500 MG tablet Take 2 tablets (1,000 mg total) by mouth 2 (two) times daily with a meal. 12/16/21  Yes Tally Joe T, FNP  ?nortriptyline (PAMELOR) 25 MG capsule TAKE 2 CAPSULES BY MOUTH AT BEDTIME 08/23/21  Yes [name], FNP  ?  omeprazole (PRILOSEC) 20 MG capsule Take 1 capsule (20 mg total) by mouth daily. 07/17/21  Yes Bacigalupo, Dionne Bucy, MD  ?oxyCODONE-acetaminophen (PERCOCET) 5-325 MG tablet Take 1-2 tablets by mouth 2 (two) times daily as needed for severe pain. Must last 30 days. 02/24/22 03/26/22 Yes Gillis Santa, MD  ?triamterene-hydrochlorothiazide (MAXZIDE-25) 37.5-25 MG tablet TAKE 1 TABLET BY MOUTH  DAILY 04/14/21  Yes Bacigalupo, Dionne Bucy, MD  ?fluticasone (FLONASE) 50 MCG/ACT nasal spray Place 2 sprays into both nostrils daily. 01/09/22   [name], FNP  ?furosemide (LASIX) 20 MG tablet TAKE 2 TABLETS(40 MG) BY MOUTH DAILY AS NEEDED FOR FLUID RETENTION OR SWELLING 11/25/21   [name], FNP  ?insulin glargine (LANTUS)  100 UNIT/ML Solostar Pen Inject 20 Units into the skin at bedtime. Titrate to a fasting blood sugar of 130-180. 01/27/22   [name], FNP  ?Insulin Pen Needle 31G X 5 MM MISC 1 each by Does not apply route daily. 01/27/22   [name], FNP  ?Melatonin 10 MG TABS Take 10 mg by mouth at bedtime.    [provider]  ?oxyCODONE-acetaminophen (PERCOCET) 5-325 MG tablet Take 1-2 tablets by mouth 2 (two) times daily as needed for severe pain. Must last 30 days. 03/26/22 04/25/22  Gillis Santa, MD  ?traZODone (DESYREL) 50 MG tablet Take 2 tablets (100 mg total) by mouth at bedtime. 12/08/21   [name], FNP  ?TRULICITY 1.5 TK/2.4OX SOPN ADMINISTER 1.5 MG UNDER THE SKIN 1 TIME A WEEK ?Patient not taking: Reported on 02/20/2022 12/16/21   [name], FNP  ? ? ?Allergies as of 01/27/2022 - Review Complete 01/27/2022  ?Allergen Reaction Noted  ? Celecoxib Shortness Of Breath 05/03/2015  ? Pregabalin Shortness Of Breath 05/03/2015  ? Buspar [buspirone] Other (See Comments) 06/29/2019  ? Citalopram Cough 02/26/2020  ? ? ?Family History  ?Problem Relation Age of Onset  ? Diabetes Father   ? Heart disease Father   ? Hypertension Father   ? Hyperlipidemia Father   ? Congestive Heart Failure Father   ? Healthy Brother   ? Osteoporosis Mother   ? Irritable bowel syndrome Mother   ? Osteoporosis Maternal Grandmother   ? ? ?Social History  ? ?Socioeconomic History  ? Marital status: Married  ?  Spouse name: Not on file  ? Number of children: 1  ? Years of education: H/S  ? Highest education level: Not on file  ?Occupational History  ? Not on file  ?Tobacco Use  ? Smoking status: Former  ?  Types: Cigarettes  ?  Quit date: 2013  ?  Years since quitting: 10.1  ? Smokeless tobacco: Never  ?Vaping Use  ? Vaping Use: Never used  ?Substance and Sexual Activity  ? Alcohol use: No  ?  Alcohol/week: 0.0 standard drinks  ? Drug use: Never  ? Sexual activity: Not on file  ?Other Topics Concern  ? Not on file  ?Social History  Narrative  ? Right handed  ? Lives in a single story home with husband and son.  ? ?Social Determinants of Health  ? ?Financial Resource Strain: Not on file  ?Food Insecurity: Not on file  ?Transportation Needs: Not on file  ?Physical Activity: Not on file  ?Stress: Not on file  ?Social Connections: Not on file  ?Intimate Partner Violence: Not on file  ? ? ?Review of Systems: ?See HPI, otherwise negative ROS ? ?Physical Exam: ?BP (!) 141/96   Pulse 92  Temp (!) 96.8 ?F (36 ?C) (Temporal)   Resp 18   Ht 5\' 10"  (1.778 m)   Wt 106.6 kg   SpO2 100%   BMI 33.72 kg/m?  ?General:   Alert,  pleasant and cooperative in NAD ?Head:  Normocephalic and atraumatic. ?Neck:  Supple; no masses or thyromegaly. ?Lungs:  Clear throughout to auscultation, normal respiratory effort.    ?Heart:  +S1, +S2, Regular rate and rhythm, No edema. ?Abdomen:  Soft, nontender and nondistended. Normal bowel sounds, without guarding, and without rebound.   ?Neurologic:  Alert and  oriented x4;  grossly normal neurologically. ? ?Impression/Plan: ?Claramae Rigdon is here for an colonoscopy to be performed for Screening colonoscopy average risk   ?Risks, benefits, limitations, and alternatives regarding  colonoscopy have been reviewed with the patient.  Questions have been answered.  All parties agreeable. ? ? ?Gabriella Bellows, MD  02/20/2022, 7:43 AM ? ?

## 2022-02-20 NOTE — Anesthesia Postprocedure Evaluation (Signed)
Anesthesia Post Note ? ?Patient: Gabriella Marsh ? ?Procedure(s) Performed: COLONOSCOPY WITH PROPOFOL ? ?Patient location during evaluation: Endoscopy ?Anesthesia Type: General ?Level of consciousness: awake and alert ?Pain management: pain level controlled ?Vital Signs Assessment: post-procedure vital signs reviewed and stable ?Respiratory status: spontaneous breathing, nonlabored ventilation and respiratory function stable ?Cardiovascular status: blood pressure returned to baseline and stable ?Postop Assessment: no apparent nausea or vomiting ?Anesthetic complications: no ? ? ?No notable events documented. ? ? ?Last Vitals:  ?Vitals:  ? 02/20/22 0906 02/20/22 0910  ?BP: 116/70 126/75  ?Pulse:    ?Resp:    ?Temp: (!) 35.9 ?C   ?SpO2:    ?  ?Last Pain:  ?Vitals:  ? 02/20/22 0906  ?TempSrc: Temporal  ?PainSc:   ? ? ?  ?  ?  ?  ?  ?  ? ?[place] ? ? ? ? ?

## 2022-02-21 ENCOUNTER — Other Ambulatory Visit: Payer: Self-pay | Admitting: Pain Medicine

## 2022-02-21 DIAGNOSIS — G894 Chronic pain syndrome: Secondary | ICD-10-CM

## 2022-02-21 DIAGNOSIS — E559 Vitamin D deficiency, unspecified: Secondary | ICD-10-CM

## 2022-02-23 ENCOUNTER — Encounter: Payer: Self-pay | Admitting: Gastroenterology

## 2022-02-23 LAB — SURGICAL PATHOLOGY

## 2022-02-25 ENCOUNTER — Other Ambulatory Visit: Payer: Self-pay

## 2022-02-25 ENCOUNTER — Encounter: Payer: Self-pay | Admitting: Student in an Organized Health Care Education/Training Program

## 2022-02-25 ENCOUNTER — Ambulatory Visit
Payer: BLUE CROSS/BLUE SHIELD | Attending: Student in an Organized Health Care Education/Training Program | Admitting: Student in an Organized Health Care Education/Training Program

## 2022-02-25 VITALS — BP 147/90 | HR 98 | Temp 96.8°F | Resp 18 | Ht 70.0 in | Wt 235.0 lb

## 2022-02-25 DIAGNOSIS — M792 Neuralgia and neuritis, unspecified: Secondary | ICD-10-CM | POA: Diagnosis present

## 2022-02-25 DIAGNOSIS — G8929 Other chronic pain: Secondary | ICD-10-CM | POA: Diagnosis present

## 2022-02-25 DIAGNOSIS — E1142 Type 2 diabetes mellitus with diabetic polyneuropathy: Secondary | ICD-10-CM | POA: Diagnosis not present

## 2022-02-25 MED ORDER — CAPSAICIN-CLEANSING GEL 8 % EX KIT
4.0000 | PACK | Freq: Once | CUTANEOUS | Status: AC
  Administered 2022-02-25: 4 via TOPICAL

## 2022-02-25 NOTE — Progress Notes (Signed)
PROVIDER NOTE: Interpretation of information contained herein should be left to medically-trained personnel. Specific patient instructions are provided elsewhere under "Patient Instructions" section of medical record. This document was created in part using STT-dictation technology, any transcriptional errors that may result from this process are unintentional.  Patient: Gabriella Marsh Type: Established DOB: August 19, 1966 MRN: [id number] PCP: [name], FNP  Service: Procedure DOS: 02/25/2022 Setting: Ambulatory Location: Ambulatory outpatient facility Delivery: Face-to-face Provider: Gillis Santa, MD Specialty: Interventional Pain Management Specialty designation: 09 Location: Outpatient facility Ref. Prov.: [name], FNP    Primary Reason for Visit: Interventional Pain Management Treatment. CC: Hand Pain and Foot Pain    Procedure #1:   Qutenza Neurolysis for PDN     1. Diabetic peripheral neuropathy ([place])   2. Chronic peripheral neuropathic pain    NAS-11 Pain score:   Pre-procedure: 3 /10   Post-procedure: 3 /10   Patient has a history of diabetic peripheral neuropathy.  We discussed Qutenza capsaicin topical therapy for her condition.  The patient does have multiple comorbidities.  We discussed the benefits of topical Qutenza which includes not systemic absorption minimizing drug drug interactions, sustained pain relief and nonopioid.  Risk and benefits reviewed and patient would like to proceed.     Pre-op H&P Assessment:  Gabriella Marsh is a 56 y.o. (year old), female patient, seen today for interventional treatment. She  has a past surgical history that includes Cesarean section; Shoulder surgery (Left, 12/26/014); Abdominal hysterectomy; Tonsillectomy and adenoidectomy; Uterine fibroid surgery; Ovarian cyst surgery; Cholecystectomy; left arm frature (10/12/2020); Thyroidectomy (N/A, 03/28/2021); Parathyroidectomy (03/28/2021); Amputation toe (Right, 07/24/2021); and  Colonoscopy with propofol (N/A, 02/20/2022). Gabriella Marsh has a current medication list which includes the following prescription(s): atorvastatin, clopidogrel, cyclobenzaprine, fluticasone, furosemide, gabapentin, gabapentin, glipizide, insulin glargine, insulin pen needle, levothyroxine, lisinopril, melatonin, metformin, nortriptyline, omeprazole, oxycodone-acetaminophen, [START ON 03/26/2022] oxycodone-acetaminophen, trazodone, triamterene-hydrochlorothiazide, and trulicity. Her primarily concern today is the Hand Pain and Foot Pain  Initial Vital Signs:  Pulse/HCG Rate: 98  Temp: (!) 96.8 F (36 C) Resp: 18 BP: (!) 147/90 SpO2: 100 %  BMI: Estimated body mass index is 33.72 kg/m as calculated from the following:   Height as of this encounter: '5\' 10"'$  (1.778 m).   Weight as of this encounter: 235 lb (106.6 kg).  Risk Assessment: Allergies: Reviewed. She is allergic to celecoxib, pregabalin, buspar [buspirone], citalopram, and other.  Allergy Precautions: None required Coagulopathies: Reviewed. None identified.  Blood-thinner therapy: None at this time Active Infection(s): Reviewed. None identified. Gabriella Marsh is afebrile  Site Confirmation: Gabriella Marsh was asked to confirm the procedure and laterality before marking the site Procedure checklist: Completed Consent: Before the procedure and under the influence of no sedative(s), amnesic(s), or anxiolytics, the patient was informed of the treatment options, risks and possible complications. To fulfill our ethical and legal obligations, as recommended by the American Medical Association's Code of Ethics, I have informed the patient of my clinical impression; the nature and purpose of the treatment or procedure; the risks, benefits, and possible complications of the intervention; the alternatives, including doing nothing; the risk(s) and benefit(s) of the alternative treatment(s) or procedure(s); and the risk(s) and benefit(s) of doing nothing. The  patient was provided information about the general risks and possible complications associated with the procedure. These may include, but are not limited to: failure to achieve desired goals, infection, bleeding, organ or nerve damage, allergic reactions, paralysis, and death. In addition, the patient was informed of those risks and  complications associated to the procedure, such as failure to decrease pain; infection; bleeding; organ or nerve damage with subsequent damage to sensory, motor, and/or autonomic systems, resulting in permanent pain, numbness, and/or weakness of one or several areas of the body; allergic reactions; (i.e.: anaphylactic reaction); and/or death. Furthermore, the patient was informed of those risks and complications associated with the medications. These include, but are not limited to: allergic reactions (i.e.: anaphylactic or anaphylactoid reaction(s)); adrenal axis suppression; blood sugar elevation that in diabetics may result in ketoacidosis or comma; water retention that in patients with history of congestive heart failure may result in shortness of breath, pulmonary edema, and decompensation with resultant heart failure; weight gain; swelling or edema; medication-induced neural toxicity; particulate matter embolism and blood vessel occlusion with resultant organ, and/or nervous system infarction; and/or aseptic necrosis of one or more joints. Finally, the patient was informed that Medicine is not an exact science; therefore, there is also the possibility of unforeseen or unpredictable risks and/or possible complications that may result in a catastrophic outcome. The patient indicated having understood very clearly. We have given the patient no guarantees and we have made no promises. Enough time was given to the patient to ask questions, all of which were answered to the patient's satisfaction. Gabriella Marsh has indicated that she wanted to continue with the procedure. Attestation:  I, the ordering provider, attest that I have discussed with the patient the benefits, risks, side-effects, alternatives, likelihood of achieving goals, and potential problems during recovery for the procedure that I have provided informed consent. Date   Time: 02/25/2022 11:39 AM  Pre-Procedure Preparation:  Monitoring: As per clinic protocol. Respiration, ETCO2, SpO2, BP, heart rate and rhythm monitor placed and checked for adequate function Safety Precautions: Patient was assessed for positional comfort and pressure points before starting the procedure. Time-out: I initiated and conducted the "Time-out" before starting the procedure, as per protocol. The patient was asked to participate by confirming the accuracy of the "Time Out" information. Verification of the correct person, site, and procedure were performed and confirmed by me, the nursing staff, and the patient. "Time-out" conducted as per Joint Commission's Universal Protocol (UP.01.01.01). Time: 1159  Description of Procedure:           Area Prepped: Entire foot Region DuraPrep (Iodine Povacrylex [0.7% available iodine] and Isopropyl Alcohol, 74% w/w)                   2,  8% Qutenza patches were applied to each foot, on the plantar and dorsal aspect covering areas that were painful. 4 total patches with used, 2 for each foot Patient tolerated the procedure well without any issues.    Vitals:   02/25/22 1140 02/25/22 1141  BP:  (!) 147/90  Pulse:  98  Resp:  18  Temp:  (!) 96.8 F (36 C)  SpO2:  100%  Weight: 235 lb (106.6 kg)   Height: '5\' 10"'$  (1.778 m)     Start Time: 1159 hrs. End Time: 1248 hrs.     Post-operative Assessment:  Post-procedure Vital Signs:  Pulse/HCG Rate: 98  Temp:  (!) 96.8 F (36 C) Resp: 18 BP:  (!) 147/90 SpO2: 100 %  EBL: None  Complications: No immediate post-treatment complications observed by team, or reported by patient.  Note: The patient tolerated the entire procedure  well. A repeat set of vitals were taken after the procedure and the patient was kept under observation following institutional policy, for this type of  procedure. Post-procedural neurological assessment was performed, showing return to baseline, prior to discharge. The patient was provided with post-procedure discharge instructions, including a section on how to identify potential problems. Should any problems arise concerning this procedure, the patient was given instructions to immediately contact us, at any time, without hesitation. In any case, we plan to contact the patient by telephone for a follow-up status report regarding this interventional procedure.  Comments:  No additional relevant information.  Plan of Care    Chronic Opioid Analgesic:  Percocet 5 mg TID as needed, quantity 75/month   Medications ordered for procedure: Meds ordered this encounter  Medications   capsaicin topical system 8 % patch 4 patch   Medications administered: We administered capsaicin topical system.  See the medical record for exact dosing, route, and time of administration.  Follow-up plan:   Return in about 4 weeks (around 03/25/2022) for Post Procedure Evaluation, virtual.      Recent Visits Date Type Provider Dept  02/03/22 Office Visit Gillis Santa, MD [facility]  01/12/22 Office Visit Milinda Pointer, MD [facility]  Showing recent visits within past 90 days and meeting all other requirements Today's Visits Date Type Provider Dept  02/25/22 Procedure visit Gillis Santa, MD [facility]  Showing today's visits and meeting all other requirements Future Appointments Date Type Provider Dept  04/21/22 Appointment Gillis Santa, MD [facility]  Showing future appointments within next 90 days and meeting all other requirements  Disposition: Discharge home  Discharge (Date   Time): 02/25/2022; 1300 hrs.   Primary Care Physician: [name],  FNP Location: [facility] Note by: Gillis Santa, MD Date: 02/25/2022; Time: 12:56 PM  Disclaimer:  Medicine is not an exact science. The only guarantee in medicine is that nothing is guaranteed. It is important to note that the decision to proceed with this intervention was based on the information collected from the patient. The Data and conclusions were drawn from the patient's questionnaire, the interview, and the physical examination. Because the information was provided in large part by the patient, it cannot be guaranteed that it has not been purposely or unconsciously manipulated. Every effort has been made to obtain as much relevant data as possible for this evaluation. It is important to note that the conclusions that lead to this procedure are derived in large part from the available data. Always take into account that the treatment will also be dependent on availability of resources and existing treatment guidelines, considered by other Pain Management Practitioners as being common knowledge and practice, at the time of the intervention. For Medico-Legal purposes, it is also important to point out that variation in procedural techniques and pharmacological choices are the acceptable norm. The indications, contraindications, technique, and results of the above procedure should only be interpreted and judged by a Board-Certified Interventional Pain Specialist with extensive familiarity and expertise in the same exact procedure and technique.

## 2022-02-25 NOTE — Patient Instructions (Signed)

## 2022-02-25 NOTE — Progress Notes (Signed)
Safety precautions to be maintained throughout the outpatient stay will include: orient to surroundings, keep bed in low position, maintain call bell within reach at all times, provide assistance with transfer out of bed and ambulation.  

## 2022-02-26 ENCOUNTER — Telehealth: Payer: Self-pay | Admitting: *Deleted

## 2022-02-26 NOTE — Telephone Encounter (Signed)
Post procedure call;  patient reports she is doing well.  No questions or concerns.  

## 2022-02-27 ENCOUNTER — Encounter: Payer: Self-pay | Admitting: Gastroenterology

## 2022-02-27 NOTE — Progress Notes (Signed)
Polyp was adenoma- prep was poor and taken out in pieces- repeat colonoscopy in 5 months with 2 day prep

## 2022-03-02 ENCOUNTER — Telehealth: Payer: Self-pay

## 2022-03-02 NOTE — Telephone Encounter (Signed)
Called patient to let her know what her surgical pathology stated and Dr. Georgeann Oppenheim recommendations. Patient agreed on repeating her colonoscopy. I told her that Dr. Vicente Males had stated that she had prepped poorly, therefore, she is to prep for 2 days when we give her a call to schedule her colonoscopy. Patient did not want to schedule it right now and asked to contact her when it got close to the date that he wants her to repeat it. I told her that we would put her on our recall list. Patient agreed and had no further questions. ?

## 2022-03-02 NOTE — Telephone Encounter (Signed)
-----   Message from [name], MD sent at 02/27/2022 11:16 AM EST ----- ?Polyp was adenoma- prep was poor and taken out in pieces- repeat colonoscopy in 5 months with 2 day prep  ?

## 2022-03-12 ENCOUNTER — Telehealth: Payer: Self-pay | Admitting: Family Medicine

## 2022-03-12 ENCOUNTER — Other Ambulatory Visit: Payer: Self-pay

## 2022-03-12 DIAGNOSIS — M62838 Other muscle spasm: Secondary | ICD-10-CM

## 2022-03-12 NOTE — Telephone Encounter (Signed)
[facility] faxed refill request for the following medications: ? ?cyclobenzaprine (FLEXERIL) 5 MG tablet  ? ?Please advise. ? ?

## 2022-03-12 NOTE — Telephone Encounter (Signed)
LOV 01/26/22 ?NOV 04/24/22 ?LRF 02/17/22 270 x 1 ? ?

## 2022-03-23 ENCOUNTER — Ambulatory Visit
Payer: BLUE CROSS/BLUE SHIELD | Attending: Student in an Organized Health Care Education/Training Program | Admitting: Student in an Organized Health Care Education/Training Program

## 2022-03-23 ENCOUNTER — Telehealth: Payer: Self-pay | Admitting: Family Medicine

## 2022-03-23 ENCOUNTER — Encounter: Payer: Self-pay | Admitting: Student in an Organized Health Care Education/Training Program

## 2022-03-23 DIAGNOSIS — E1142 Type 2 diabetes mellitus with diabetic polyneuropathy: Secondary | ICD-10-CM

## 2022-03-23 DIAGNOSIS — E1140 Type 2 diabetes mellitus with diabetic neuropathy, unspecified: Secondary | ICD-10-CM

## 2022-03-23 DIAGNOSIS — G608 Other hereditary and idiopathic neuropathies: Secondary | ICD-10-CM | POA: Diagnosis not present

## 2022-03-23 DIAGNOSIS — M792 Neuralgia and neuritis, unspecified: Secondary | ICD-10-CM | POA: Diagnosis not present

## 2022-03-23 DIAGNOSIS — G8929 Other chronic pain: Secondary | ICD-10-CM | POA: Diagnosis not present

## 2022-03-23 MED ORDER — GABAPENTIN 600 MG PO TABS: 600.0000 mg | ORAL_TABLET | Freq: Three times a day (TID) | ORAL | 1 refills | Status: DC

## 2022-03-23 NOTE — Telephone Encounter (Signed)
[facility] faxed refill request for the following medications: ? ?gabapentin (NEURONTIN) 600 MG tablet ? ? ?Please advise ? ?

## 2022-03-23 NOTE — Progress Notes (Signed)
Patient: Gabriella Marsh  Service Category: E/M  Provider: Gillis Santa, MD  ?DOB: 1966/12/03  DOS: 03/23/2022  Location: Office  ?MRN: [id number]  Setting: Ambulatory outpatient  Referring Provider: [name], FNP  ?Type: Established Patient  Specialty: Interventional Pain Management  PCP: [name], FNP  ?Location: Remote location  Delivery: TeleHealth    ? ?Virtual Encounter - Pain Management ?PROVIDER NOTE: Information contained herein reflects review and annotations entered in association with encounter. Interpretation of such information and data should be left to medically-trained personnel. Information provided to patient can be located elsewhere in the medical record under "Patient Instructions". Document created using STT-dictation technology, any transcriptional errors that may result from process are unintentional.  ?  ?Contact & Pharmacy ?Preferred: [phone] ?Home: [phone] (home) ?Mobile: [phone] (mobile) ?E-mail: [email or web address]  ?[facility] [place], [place] AT [facility] OF [address] ?[place] ?[place] [address] ?Phone: [phone] Fax: [phone] ? ?[facility] - [place], [place] ?[address] ?[place] [address] ?Phone: [phone] Fax: [phone] ? ?[place], [place] ?[address] ?[place] [address] ?Phone: [phone] Fax: [phone] ? ?[place] ([place]), [place] - [place] ?[address] ([place]) [place] [address] ?Phone: [phone] Fax: [phone] ? ?[facility] ([place], [place] ?[address] ?[address] ?[place] [address] ?Phone: [phone] Fax: [phone] ? ?[place] ([facility] ) - [place], [place] ?[address] ?[place] [address] ?Phone: [phone] Fax: [phone] ?  ?Pre-screening  ?Ms. Trolinger offered "in-person" vs "virtual" encounter. She indicated preferring virtual for this encounter.  ? ?Reason ?COVID-19*  Social distancing based on CDC and AMA recommendations.  ? ?I contacted Gabriella Marsh on 03/23/2022 via telephone.      I clearly identified myself as Gillis Santa, MD. I verified that I was speaking with the correct person using two identifiers (Name: Gabriella Marsh, and date of birth: 11-11-1966). ? ?Consent ?I sought verbal advanced consent from Gabriella Marsh for virtual visit interactions. I informed Ms. Delker of possible security and privacy concerns, risks, and limitations associated with providing "not-in-person" medical evaluation and management services. I also informed Ms. Watters of the availability of "in-person" appointments. Finally, I informed her that there would be a charge for the virtual visit and that she could be  personally, fully or partially, financially responsible for it. Ms. Bok expressed understanding and agreed to proceed.  ? ?Historic Elements   ?Gabriella Marsh is a 56 y.o. year old, female patient evaluated today after our last contact on 02/25/2022. Gabriella Marsh  has a past medical history of Arthritis, Blood clotting disorder ([place]), Degenerative disc disease, lumbar, Diabetes mellitus without complication ([place]), GERD (gastroesophageal reflux disease), Headache, Hyperlipidemia, Hypertension, Neuropathy, and Vertigo. She also  has a past surgical history that includes Cesarean section; Shoulder surgery (Left, 12/26/014); Abdominal hysterectomy; Tonsillectomy and adenoidectomy; Uterine fibroid surgery; Ovarian cyst surgery; Cholecystectomy; left arm frature (10/12/2020); Thyroidectomy (N/A, 03/28/2021); Parathyroidectomy (03/28/2021); Amputation toe (Right, 07/24/2021); and Colonoscopy with propofol (N/A, 02/20/2022). Gabriella Marsh has a current medication  list which includes the following prescription(s): atorvastatin, clopidogrel, cyclobenzaprine, fluticasone, furosemide, gabapentin, glipizide, insulin glargine, insulin  pen needle, levothyroxine, lisinopril, melatonin, metformin, nortriptyline, omeprazole, oxycodone-acetaminophen, [START ON 03/26/2022] oxycodone-acetaminophen, trazodone, triamterene-hydrochlorothiazide, gabapentin, and trulicity. She  reports that she quit smoking about 10 years ago. Her smoking use included cigarettes. She has never used smokeless tobacco. She reports that she does not drink alcohol and does not use drugs. [name] is allergic to celecoxib, pregabalin, buspar [buspirone], citalopram, and other.  ? ?HPI  ?Today, she is being contacted for a post-procedure assessment. ? ? ?Post-procedure evaluation  ?  ?Procedure #1:   ?Qutenza Neurolysis for PDN  ?  ? ?1. Diabetic peripheral neuropathy ([place])   ?2. Chronic peripheral neuropathic pain   ? ?NAS-11 Pain score:  ? Pre-procedure: 3 /10  ? Post-procedure: 3 /10  ? ?Patient has a history of diabetic peripheral neuropathy.  We discussed Qutenza capsaicin topical therapy for her condition.  The patient does have multiple comorbidities.  We discussed the benefits of topical Qutenza which includes not systemic absorption minimizing drug drug interactions, sustained pain relief and nonopioid.  Risk and benefits reviewed and patient would like to proceed.  ? ?   ?Effectiveness:  ?Initial hour after procedure: 95 %  ?Subsequent 4-6 hours post-procedure: 95 %  ?Analgesia past initial 6 hours: 95 % (has worked out really good.  has a few twinges here and now.  pain is now in the legs, cramping at night while sleeping.)  ?Ongoing improvement:  ?Analgesic:  85--95% ?Function: Gabriella Marsh reports improvement in function ?ROM: Gabriella Marsh reports improvement in ROM ? ? ?Pharmacotherapy Assessment  ? ?Opioid Analgesic: Percocet 5 mg TID as needed, quantity 75/month  ? ?Monitoring: ?[place] PMP: PDMP reviewed  during this encounter.       ?Pharmacotherapy: No side-effects or adverse reactions reported. ?Compliance: No problems identified. ?Effectiveness: Clinically acceptable. ?Plan: Refer to "POC". UDS:  ?Summary  ?Date Value Ref Range Status  ?11/12/2021 Note  Final  ?  Comment:  ?  ==================================================================== ?Compliance Drug Analysis, Ur ?==================================================================== ?Test                             Result       Flag       Units ? ?Drug Present and Declared for Prescription Verification ?  Oxycodone                      1136         EXPECTED   ng/mg creat ?  Oxymorphone                    1753         EXPECTED   ng/mg creat ?  Noroxycodone                   1215         EXPECTED   ng/mg creat ?  Noroxymorphone                 779          EXPECTED   ng/mg creat ?   Sources of oxycodone are scheduled prescription medications. ?   Oxymorphone, noroxycodone, and noroxymorphone are expected ?   metabolites of oxycodone. Oxymorphone is also available as a ?   scheduled prescription medication. ? ?  Gabapentin                     PRESENT      EXPECTED ?  Cyclobenzaprine                PRESENT      EXPECTED ?  Desmethylcyclobenzaprine       PRESENT      EXPECTED ?   Desmethylcyclobenzaprine is an expected metabolite of ?   cyclobenzaprine. ? ?  Nortriptyline                  PRESENT      EXPECTED ?   Nortriptyline may be administered as a prescription drug; it is also ?   an expected metabolite of amitriptyline. ? ?  Trazodone                      PRESENT      EXPECTED ?  1,3 chlorophenyl piperazine    PRESENT      EXPECTED ?   1,3-chlorophenyl piperazine is an expected metabolite of trazodone. ? ?  Acetaminophen                  PRESENT      EXPECTED ?==================================================================== ?Test                      Result    Flag   Units      Ref Range ?  Creatinine              165              mg/dL       >=20 ?==================================================================== ?Declared Medications: ? The flagging and interpretation on this report are based on the ? following declared medications.  Unexpecte

## 2022-03-28 ENCOUNTER — Encounter: Payer: Self-pay | Admitting: Student in an Organized Health Care Education/Training Program

## 2022-03-30 ENCOUNTER — Encounter: Payer: Self-pay | Admitting: *Deleted

## 2022-03-30 NOTE — Progress Notes (Unsigned)
Safety precautions to be maintained throughout the outpatient stay will include: orient to surroundings, keep bed in low position, maintain call bell within reach at all times, provide assistance with transfer out of bed and ambulation.  

## 2022-04-04 ENCOUNTER — Encounter: Payer: Self-pay | Admitting: Family Medicine

## 2022-04-06 ENCOUNTER — Other Ambulatory Visit: Payer: Self-pay | Admitting: Family Medicine

## 2022-04-06 DIAGNOSIS — R296 Repeated falls: Secondary | ICD-10-CM

## 2022-04-06 DIAGNOSIS — Z7409 Other reduced mobility: Secondary | ICD-10-CM

## 2022-04-07 NOTE — Telephone Encounter (Signed)
Requested Prescriptions  ?Pending Prescriptions Disp Refills  ?? triamterene-hydrochlorothiazide (MAXZIDE-25) 37.5-25 MG tablet [Pharmacy Med Name: Triamterene-HCTZ 37.5-25 MG Oral Tablet] 90 tablet 3  ?  Sig: TAKE 1 TABLET BY MOUTH  DAILY  ?  ? Cardiovascular: Diuretic Combos Failed - 04/06/2022 11:32 PM  ?  ?  Failed - Last BP in normal range  ?  BP Readings from Last 1 Encounters:  ?02/25/22 (!) 147/90  ?   ?  ?  Passed - K in normal range and within 180 days  ?  Potassium  ?Date Value Ref Range Status  ?11/12/2021 4.2 3.5 - 5.2 mmol/L Final  ?   ?  ?  Passed - Na in normal range and within 180 days  ?  Sodium  ?Date Value Ref Range Status  ?11/12/2021 135 134 - 144 mmol/L Final  ?   ?  ?  Passed - Cr in normal range and within 180 days  ?  Creatinine, Ser  ?Date Value Ref Range Status  ?11/12/2021 0.71 0.57 - 1.00 mg/dL Final  ? ?Creatinine,U  ?Date Value Ref Range Status  ?06/05/2015 194.5 mg/dL Final  ?   ?  ?  Passed - Valid encounter within last 6 months  ?  Recent Outpatient Visits   ?      ? 2 months ago Type 2 diabetes mellitus with microalbuminuria, without long-term current use of insulin ([place])  ? [facility] Tally Joe T, FNP  ? 2 months ago Left acute otitis media  ? [facility] [name], FNP  ? 5 months ago Type 2 diabetes mellitus with microalbuminuria, without long-term current use of insulin ([place])  ? [facility] [name], FNP  ? 8 months ago Disorder of peripheral nervous system  ? [facility] Bacigalupo, Dionne Bucy, MD  ? 1 year ago Pre-op exam  ? [facility] [name] M, [place]  ?  ?  ?Future Appointments   ?        ? In 2 weeks [name], [place], [facility]  ?  ? ?  ?  ?  ? ? ?

## 2022-04-08 ENCOUNTER — Other Ambulatory Visit: Payer: Self-pay | Admitting: Family Medicine

## 2022-04-08 DIAGNOSIS — G2581 Restless legs syndrome: Secondary | ICD-10-CM

## 2022-04-09 NOTE — Telephone Encounter (Signed)
Requested Prescriptions  ?Pending Prescriptions Disp Refills  ?? nortriptyline (PAMELOR) 25 MG capsule [Pharmacy Med Name: NORTRIPTYLINE  '25MG'$   CAP] 180 capsule 1  ?  Sig: TAKE 2 CAPSULES BY MOUTH AT BEDTIME  ?  ? Psychiatry:  Antidepressants - Heterocyclics (TCAs) Passed - 04/08/2022 10:43 PM  ?  ?  Passed - Completed PHQ-2 or PHQ-9 in the last 360 days  ?  ?  Passed - Valid encounter within last 6 months  ?  Recent Outpatient Visits   ?      ? 2 months ago Type 2 diabetes mellitus with microalbuminuria, without long-term current use of insulin ([place])  ? [facility] Tally Joe T, FNP  ? 3 months ago Left acute otitis media  ? [facility] [name], FNP  ? 5 months ago Type 2 diabetes mellitus with microalbuminuria, without long-term current use of insulin ([place])  ? [facility] [name], FNP  ? 8 months ago Disorder of peripheral nervous system  ? [facility] Bacigalupo, Dionne Bucy, MD  ? 1 year ago Pre-op exam  ? [facility] [name] M, [place]  ?  ?  ?Future Appointments   ?        ? In 2 weeks [name], [place], [facility]  ?  ? ?  ?  ?  ? ? ?

## 2022-04-13 ENCOUNTER — Encounter: Payer: Self-pay | Admitting: Family Medicine

## 2022-04-14 ENCOUNTER — Other Ambulatory Visit: Payer: Self-pay | Admitting: Family Medicine

## 2022-04-14 ENCOUNTER — Other Ambulatory Visit (INDEPENDENT_AMBULATORY_CARE_PROVIDER_SITE_OTHER): Payer: Self-pay | Admitting: Nurse Practitioner

## 2022-04-14 DIAGNOSIS — E039 Hypothyroidism, unspecified: Secondary | ICD-10-CM

## 2022-04-14 DIAGNOSIS — E1129 Type 2 diabetes mellitus with other diabetic kidney complication: Secondary | ICD-10-CM

## 2022-04-15 MED ORDER — LEVOTHYROXINE SODIUM 150 MCG PO TABS: 150.0000 ug | ORAL_TABLET | Freq: Every day | ORAL | 0 refills | Status: DC

## 2022-04-16 ENCOUNTER — Encounter: Payer: Self-pay | Admitting: Family Medicine

## 2022-04-16 NOTE — Progress Notes (Signed)
? ? ?I,Sha'taria Tyson,acting as a Education administrator for [employer], PA-C.,have documented all relevant documentation on the behalf of Mikey Kirschner, PA-C,as directed by  Mikey Kirschner, PA-C while in the presence of Mikey Kirschner, PA-C. ? ?MyChart Video Visit ? ? ? ?Virtual Visit via Video Note  ? ?This visit type was conducted due to national recommendations for restrictions regarding the COVID-19 Pandemic (e.g. social distancing) in an effort to limit this patient's exposure and mitigate transmission in our community. This patient is at least at moderate risk for complications without adequate follow up. This format is felt to be most appropriate for this patient at this time. Physical exam was limited by quality of the video and audio technology used for the visit.  ? ?Patient location: home ?Provider location: [place] ? ?I discussed the limitations of evaluation and management by telemedicine and the availability of in person appointments. The patient expressed understanding and agreed to proceed. ? ?Patient: Gabriella Marsh   DOB: June 06, 1966   56 y.o. Female  MRN: [id number] ?Visit Date: 04/17/2022 ? ?Today's healthcare provider: Mikey Kirschner, PA-C  ? ?Cc. Cough, feeling unwell ? ?Subjective  ?  ?Cough ?This is a new problem. The current episode started 1 to 4 weeks ago. The problem occurs constantly. The cough is Productive of sputum. Associated symptoms include chest pain. She has tried OTC cough suppressant for the symptoms. The treatment provided no relief.  Associated chest tightness/heaviness, some mild SOB, headache, nasal congestion. Taking flonase and claritin with no improvement. Denies fever, dizziness, persistent SOB. ? ?Medications: ?Outpatient Medications Prior to Visit  ?Medication Sig  ? atorvastatin (LIPITOR) 20 MG tablet Take 1 tablet (20 mg total) by mouth daily.  ? clopidogrel (PLAVIX) 75 MG tablet TAKE 1 TABLET(75 MG) BY MOUTH DAILY  ? cyclobenzaprine (FLEXERIL) 5 MG  tablet Take 1 tablet (5 mg total) by mouth 3 (three) times daily as needed for muscle spasms.  ? fluticasone (FLONASE) 50 MCG/ACT nasal spray Place 2 sprays into both nostrils daily.  ? furosemide (LASIX) 20 MG tablet TAKE 2 TABLETS(40 MG) BY MOUTH DAILY AS NEEDED FOR FLUID RETENTION OR SWELLING  ? gabapentin (NEURONTIN) 100 MG capsule TAKE 2 CAPSULES THREE TIMES A DAY ALONG WITH 600 MG THREE TIMES A DAY FOR A TOTAL DAILY DOSE OF 2400 MG DAILY  ? gabapentin (NEURONTIN) 600 MG tablet Take 1 tablet (600 mg total) by mouth 3 (three) times daily.  ? glipiZIDE (GLUCOTROL) 10 MG tablet TAKE ONE-HALF TABLET BY  MOUTH TWICE DAILY BEFORE  MEALS  ? insulin glargine (LANTUS) 100 UNIT/ML Solostar Pen Inject 20 Units into the skin at bedtime. Titrate to a fasting blood sugar of 130-180.  ? Insulin Pen Needle 31G X 5 MM MISC 1 each by Does not apply route daily.  ? levothyroxine (SYNTHROID) 150 MCG tablet Take 1 tablet (150 mcg total) by mouth daily at 6 (six) AM. Please keep next appt for labs.  ? lisinopril (ZESTRIL) 5 MG tablet TAKE 1 TABLET(5 MG) BY MOUTH DAILY  ? Melatonin 10 MG TABS Take 10 mg by mouth at bedtime.  ? metFORMIN (GLUCOPHAGE) 500 MG tablet Take 2 tablets (1,000 mg total) by mouth 2 (two) times daily with a meal.  ? nortriptyline (PAMELOR) 25 MG capsule TAKE 2 CAPSULES BY MOUTH AT BEDTIME  ? omeprazole (PRILOSEC) 20 MG capsule Take 1 capsule (20 mg total) by mouth daily.  ? oxyCODONE-acetaminophen (PERCOCET) 5-325 MG tablet Take 1-2 tablets by mouth 2 (two) times daily as  needed for severe pain. Must last 30 days.  ? oxyCODONE-acetaminophen (PERCOCET) 5-325 MG tablet Take 1-2 tablets by mouth 2 (two) times daily as needed for severe pain. Must last 30 days.  ? traZODone (DESYREL) 50 MG tablet Take 2 tablets (100 mg total) by mouth at bedtime.  ? triamterene-hydrochlorothiazide (MAXZIDE-25) 37.5-25 MG tablet TAKE 1 TABLET BY MOUTH  DAILY  ? TRULICITY 1.5 JF/3.5KT SOPN ADMINISTER 1.5 MG UNDER THE SKIN 1 TIME A  WEEK (Patient not taking: Reported on 02/20/2022)  ? ?No facility-administered medications prior to visit.  ? ? ?Review of Systems  ?Respiratory:  Positive for cough.   ?Cardiovascular:  Positive for chest pain.  ? ? ? ? Objective  ?  ?There were no vitals taken for this visit. ? ? ? ? ?Physical Exam ?Neurological:  ?   Mental Status: She is oriented to person, place, and time.  ?Psychiatric:     ?   Mood and Affect: Mood normal.     ?   Behavior: Behavior normal.  ?  ? ? ? Assessment & Plan  ?  ? ?Acute bronchitis 2. Acute sinusitis ?Rx augmentin 875 mg bid x 1 week to treat sinus infection and potential bacterial bronchitis infection.  ?Pt w/ significant comorbidity of obesity and DM ?Advised mucinex OTC twice daily, increase fluids ?If increased SOB, dizziness, fever, please call office ? ?I discussed the assessment and treatment plan with the patient. The patient was provided an opportunity to Marsh questions and all were answered. The patient agreed with the plan and demonstrated an understanding of the instructions. ?  ?The patient was advised to call back or seek an in-person evaluation if the symptoms worsen or if the condition fails to improve as anticipated. ? ?I provided 10 minutes of non-face-to-face time during this encounter. ? ?I, Mikey Kirschner, PA-C have reviewed all documentation for this visit. The documentation on 04/17/2022 for the exam, diagnosis, procedures, and orders are all accurate and complete. ? ?Mikey Kirschner, PA-C ?[place] ?[id number] ?[place], [place], [address] ?Office: [phone] ?Fax: [phone]  ? ?[facility]  ? ?

## 2022-04-17 ENCOUNTER — Telehealth (INDEPENDENT_AMBULATORY_CARE_PROVIDER_SITE_OTHER): Payer: BLUE CROSS/BLUE SHIELD | Admitting: Physician Assistant

## 2022-04-17 DIAGNOSIS — J0110 Acute frontal sinusitis, unspecified: Secondary | ICD-10-CM | POA: Diagnosis not present

## 2022-04-17 DIAGNOSIS — J219 Acute bronchiolitis, unspecified: Secondary | ICD-10-CM

## 2022-04-17 MED ORDER — AMOXICILLIN-POT CLAVULANATE 875-125 MG PO TABS: 1.0000 | ORAL_TABLET | Freq: Two times a day (BID) | ORAL | 0 refills | Status: DC

## 2022-04-20 ENCOUNTER — Encounter: Payer: Self-pay | Admitting: Physician Assistant

## 2022-04-21 ENCOUNTER — Encounter: Payer: Self-pay | Admitting: Student in an Organized Health Care Education/Training Program

## 2022-04-21 ENCOUNTER — Other Ambulatory Visit: Payer: Self-pay | Admitting: Physician Assistant

## 2022-04-21 ENCOUNTER — Ambulatory Visit
Payer: BLUE CROSS/BLUE SHIELD | Attending: Student in an Organized Health Care Education/Training Program | Admitting: Student in an Organized Health Care Education/Training Program

## 2022-04-21 VITALS — BP 124/83 | HR 86 | Temp 97.1°F | Ht 70.0 in | Wt 240.0 lb

## 2022-04-21 DIAGNOSIS — M79642 Pain in left hand: Secondary | ICD-10-CM | POA: Diagnosis present

## 2022-04-21 DIAGNOSIS — Z79899 Other long term (current) drug therapy: Secondary | ICD-10-CM

## 2022-04-21 DIAGNOSIS — Z8781 Personal history of (healed) traumatic fracture: Secondary | ICD-10-CM

## 2022-04-21 DIAGNOSIS — E1142 Type 2 diabetes mellitus with diabetic polyneuropathy: Secondary | ICD-10-CM | POA: Diagnosis not present

## 2022-04-21 DIAGNOSIS — Z79891 Long term (current) use of opiate analgesic: Secondary | ICD-10-CM | POA: Diagnosis present

## 2022-04-21 DIAGNOSIS — M25562 Pain in left knee: Secondary | ICD-10-CM | POA: Diagnosis present

## 2022-04-21 DIAGNOSIS — M79672 Pain in left foot: Secondary | ICD-10-CM | POA: Insufficient documentation

## 2022-04-21 DIAGNOSIS — G894 Chronic pain syndrome: Secondary | ICD-10-CM | POA: Diagnosis present

## 2022-04-21 DIAGNOSIS — M79671 Pain in right foot: Secondary | ICD-10-CM | POA: Insufficient documentation

## 2022-04-21 DIAGNOSIS — M4310 Spondylolisthesis, site unspecified: Secondary | ICD-10-CM

## 2022-04-21 DIAGNOSIS — M79641 Pain in right hand: Secondary | ICD-10-CM | POA: Diagnosis present

## 2022-04-21 DIAGNOSIS — M545 Low back pain, unspecified: Secondary | ICD-10-CM | POA: Diagnosis present

## 2022-04-21 DIAGNOSIS — M25561 Pain in right knee: Secondary | ICD-10-CM | POA: Insufficient documentation

## 2022-04-21 DIAGNOSIS — M170 Bilateral primary osteoarthritis of knee: Secondary | ICD-10-CM

## 2022-04-21 DIAGNOSIS — M25512 Pain in left shoulder: Secondary | ICD-10-CM | POA: Insufficient documentation

## 2022-04-21 DIAGNOSIS — G8929 Other chronic pain: Secondary | ICD-10-CM

## 2022-04-21 DIAGNOSIS — M792 Neuralgia and neuritis, unspecified: Secondary | ICD-10-CM | POA: Insufficient documentation

## 2022-04-21 DIAGNOSIS — G608 Other hereditary and idiopathic neuropathies: Secondary | ICD-10-CM | POA: Diagnosis not present

## 2022-04-21 DIAGNOSIS — J219 Acute bronchiolitis, unspecified: Secondary | ICD-10-CM

## 2022-04-21 MED ORDER — OXYCODONE-ACETAMINOPHEN 5-325 MG PO TABS: 1.0000 | ORAL_TABLET | Freq: Two times a day (BID) | ORAL | 0 refills | Status: DC | PRN

## 2022-04-21 MED ORDER — BENZONATATE 200 MG PO CAPS: 200.0000 mg | ORAL_CAPSULE | Freq: Two times a day (BID) | ORAL | 0 refills | Status: DC | PRN

## 2022-04-21 NOTE — Progress Notes (Signed)
PROVIDER NOTE: Information contained herein reflects review and annotations entered in association with encounter. Interpretation of such information and data should be left to medically-trained personnel. Information provided to patient can be located elsewhere in the medical record under "Patient Instructions". Document created using STT-dictation technology, any transcriptional errors that may result from process are unintentional.  ?  ?Patient: Gabriella Marsh  Service Category: E/M  Provider: Gillis Santa, MD  ?DOB: 1966-06-08  DOS: 04/21/2022  Specialty: Interventional Pain Management  ?MRN: [id number]  Setting: Ambulatory outpatient  PCP: Gabriella Sprout, FNP  ?Type: Established Patient    Referring Provider: [name], FNP  ?Location: Office  Delivery: Face-to-face    ? ?HPI  ?Gabriella Marsh, a 56 y.o. year old female, is here today because of her Diabetic peripheral neuropathy ([place]) [E11.42]. [name] primary complain today is Back Pain (lower) ?Last encounter: My last encounter with her was on 03/23/2022. ?Pertinent problems: [name] has Diabetic peripheral neuropathy ([place]); RLS (restless legs syndrome); Chronic low back pain (2ry area of Pain) (Bilateral) (L>R) w/o sciatica; DDD (degenerative disc disease), lumbosacral; Disorder of peripheral nervous system; Chronic pain syndrome; Pharmacologic therapy; Disorder of skeletal system; Problems influencing health status; Chronic hand pain (1ry area of Pain) (Bilateral) (L>R); Chronic feet pain (3ry area of Pain) (Bilateral) (L>R); Chronic shoulder pain (4th area of Pain) (Left); Chronic knee pain (5th area of Pain) (Bilateral) (L>R); Lumbar facet syndrome (Bilateral); Chronic sensorimotor polyneuropathy with axonal and demyelinating features; Osteoarthritis of knees (Bilateral); Tricompartment osteoarthritis of knees (Bilateral);  Grade 1 Retrolisthesis (14m) of L5 over S1; Chronic peripheral neuropathic pain; and Type 2 diabetes  mellitus with peripheral neuropathy ([place] on their pertinent problem list. ?Pain Assessment: Severity of Chronic pain is reported as a 7 /10. Location: Leg Left/Down her left leg. Onset: More than a month ago. Quality: Tingling, Aching, Constant, Pressure, Stabbing. Timing: Constant. Modifying factor(s): procedures, meds. ?Vitals:  height is '5\' 10"'$  (1.778 m) and weight is 240 lb (108.9 kg). Her temperature is 97.1 ?F (36.2 ?C) (abnormal). Her blood pressure is 124/83 and her pulse is 86. Her oxygen saturation is 98%.  ? ?Reason for encounter: medication management.  ? ?Patient follows up today for medication management.  She is finding benefit with her current dose of Percocet which she is takes 2-3 times a day as needed for pain.  She is on insulin and hoping to get her blood sugars better controlled.  She is obtaining significant pain relief from her Qutenza treatments and has another one scheduled in June.  I am pleased to hear that her bilateral foot paresthesias are so much better since the Qutenza treatment.  She is wondering if such treatments can be done for her hands and I told her that it is not approved for the upper extremity yet.  We also discussed spinal cord stimulation for lower extremity neuropathic pain.  Obviously she will need to have her A1c less than 7.5 for u[place]ato consider work-up and for her to be a candidate for spinal cord stimulator trial. ? ?Pharmacotherapy Assessment  ?Analgesic: Percocet 5 mg TID as needed, quantity 75/month  ? ?Monitoring: ?[place] PMP: PDMP reviewed during this encounter.       ?Pharmacotherapy: No side-effects or adverse reactions reported. ?Compliance: No problems identified. ?Effectiveness: Clinically acceptable. ? ?TDewayne Shorter RN  04/21/2022  2:39 PM  Sign when Signing Visit ?Nursing Pain Medication Assessment:  ?Safety precautions to be maintained throughout the outpatient stay will include: orient  to surroundings, keep bed in low position, maintain call bell within  reach at all times, provide assistance with transfer out of bed and ambulation.  ?Medication Inspection Compliance: Pill count conducted under aseptic conditions, in front of the patient. Neither the pills nor the bottle was removed from the patient's sight at any time. Once count was completed pills were immediately returned to the patient in their original bottle. ? ?Medication: Oxycodone/APAP ?Pill/Patch Count:  21 of 75 pills remain ?Pill/Patch Appearance: Markings consistent with prescribed medication ?Bottle Appearance: Standard pharmacy container. Clearly labeled. ?Filled Date: 4 / 8 / 2023 ?Last Medication intake:  TodaySafety precautions to be maintained throughout the outpatient stay will include: orient to surroundings, keep bed in low position, maintain call bell within reach at all times, provide assistance with transfer out of bed and ambulation. ?  UDS:  ?Summary  ?Date Value Ref Range Status  ?11/12/2021 Note  Final  ?  Comment:  ?  ==================================================================== ?Compliance Drug Analysis, Ur ?==================================================================== ?Test                             Result       Flag       Units ? ?Drug Present and Declared for Prescription Verification ?  Oxycodone                      1136         EXPECTED   ng/mg creat ?  Oxymorphone                    1753         EXPECTED   ng/mg creat ?  Noroxycodone                   1215         EXPECTED   ng/mg creat ?  Noroxymorphone                 779          EXPECTED   ng/mg creat ?   Sources of oxycodone are scheduled prescription medications. ?   Oxymorphone, noroxycodone, and noroxymorphone are expected ?   metabolites of oxycodone. Oxymorphone is also available as a ?   scheduled prescription medication. ? ?  Gabapentin                     PRESENT      EXPECTED ?  Cyclobenzaprine                PRESENT      EXPECTED ?  Desmethylcyclobenzaprine       PRESENT      EXPECTED ?    Desmethylcyclobenzaprine is an expected metabolite of ?   cyclobenzaprine. ? ?  Nortriptyline                  PRESENT      EXPECTED ?   Nortriptyline may be administered as a prescription drug; it is also ?   an expected metabolite of amitriptyline. ? ?  Trazodone                      PRESENT      EXPECTED ?  1,3 chlorophenyl piperazine    PRESENT      EXPECTED ?   1,3-chlorophenyl piperazine is an expected metabolite of trazodone. ? ?  Acetaminophen                  PRESENT      EXPECTED ?==================================================================== ?Test                      Result    Flag   Units      Ref Range ?  Creatinine              165              mg/dL      >=20 ?==================================================================== ?Declared Medications: ? The flagging and interpretation on this report are based on the ? following declared medications.  Unexpected results may arise from ? inaccuracies in the declared medications. ? ? **Note: The testing scope of this panel includes these medications: ? ? Cyclobenzaprine (Flexeril) ? Gabapentin (Neurontin) ? Nortriptyline (Pamelor) ? Oxycodone (Percocet) ? Trazodone (Desyrel) ? ? **Note: The testing scope of this panel does not include small to ? moderate amounts of these reported medications: ? ? Acetaminophen (Percocet) ? ? **Note: The testing scope of this panel does not include the ? following reported medications: ? ? Atorvastatin ? Clopidogrel ? Dulaglutide (Trulicity) ? Glipizide (Glucotrol) ? Hydrochlorothiazide ? Levothyroxine ? Lisinopril (Zestril) ? Melatonin ? Metformin (Glucophage) ? Omeprazole ? Triamterene ?==================================================================== ?For clinical consultation, please call [phone]. ?==================================================================== ?  ?  ? ?ROS  ?Constitutional: Denies any fever or chills ?Gastrointestinal: No reported hemesis, hematochezia, vomiting, or acute GI  distress ?Musculoskeletal: Denies any acute onset joint swelling, redness, loss of ROM, or weakness ?Neurological: No reported episodes of acute onset apraxia, aphasia, dysarthria, agnosia, amnesia, paralysis, loss of c

## 2022-04-21 NOTE — Progress Notes (Signed)
Nursing Pain Medication Assessment:  ?Safety precautions to be maintained throughout the outpatient stay will include: orient to surroundings, keep bed in low position, maintain call bell within reach at all times, provide assistance with transfer out of bed and ambulation.  ?Medication Inspection Compliance: Pill count conducted under aseptic conditions, in front of the patient. Neither the pills nor the bottle was removed from the patient's sight at any time. Once count was completed pills were immediately returned to the patient in their original bottle. ? ?Medication: Oxycodone/APAP ?Pill/Patch Count:  21 of 75 pills remain ?Pill/Patch Appearance: Markings consistent with prescribed medication ?Bottle Appearance: Standard pharmacy container. Clearly labeled. ?Filled Date: 4 / 8 / 2023 ?Last Medication intake:  TodaySafety precautions to be maintained throughout the outpatient stay will include: orient to surroundings, keep bed in low position, maintain call bell within reach at all times, provide assistance with transfer out of bed and ambulation. ?

## 2022-04-21 NOTE — Patient Instructions (Signed)
Magnesium for muscle cramps ?

## 2022-04-23 ENCOUNTER — Other Ambulatory Visit: Payer: Self-pay | Admitting: Family Medicine

## 2022-04-23 NOTE — Telephone Encounter (Signed)
Requested medication (s) are due for refill today:   No   Has an appt with Daneil Dan tomorrow 5/5 so has enough to last. ? ?Requested medication (s) are on the active medication list:   Yes ? ?Future visit scheduled:   Yes tomorrow with Daneil Dan 04/24/2022 ? ? ?Last ordered: 04/15/2022 #10, 0 refills. ? ?Returned because she has enough to get through her appt.   TSH is due.    ? ?Requested Prescriptions  ?Pending Prescriptions Disp Refills  ? levothyroxine (SYNTHROID) 150 MCG tablet [Pharmacy Med Name: LEVOTHYROXINE 0.'150MG'$  (150MCG) TAB] 10 tablet 0  ?  Sig: TAKE 1 TABLET BY MOUTH EVERY DAY AT 6:00 AM  ?  ? Endocrinology:  Hypothyroid Agents Failed - 04/23/2022  3:11 AM  ?  ?  Failed - TSH in normal range and within 360 days  ?  TSH  ?Date Value Ref Range Status  ?01/03/2021 1.750 0.450 - 4.500 uIU/mL Final  ?  ?  ?  ?  Passed - Valid encounter within last 12 months  ?  Recent Outpatient Visits   ? ?      ? 6 days ago Acute non-recurrent frontal sinusitis  ? [facility] Thedore Mins, Ria Comment, PA-C  ? 2 months ago Type 2 diabetes mellitus with microalbuminuria, without long-term current use of insulin ([place])  ? [facility] Tally Joe T, FNP  ? 3 months ago Left acute otitis media  ? [facility] Tally Joe T, FNP  ? 6 months ago Type 2 diabetes mellitus with microalbuminuria, without long-term current use of insulin ([place])  ? [facility] [name], FNP  ? 9 months ago Disorder of peripheral nervous system  ? [facility] Bacigalupo, Dionne Bucy, MD  ? ?  ?  ?Future Appointments   ? ?        ? Tomorrow [name], [place], [facility]  ? ?  ? ? ?  ?  ?  ? ?

## 2022-04-24 ENCOUNTER — Ambulatory Visit: Payer: BLUE CROSS/BLUE SHIELD | Admitting: Family Medicine

## 2022-04-24 ENCOUNTER — Other Ambulatory Visit: Payer: Self-pay | Admitting: Family Medicine

## 2022-04-24 ENCOUNTER — Encounter: Payer: Self-pay | Admitting: Family Medicine

## 2022-04-24 VITALS — BP 114/76 | HR 86 | Resp 18 | Wt 233.0 lb

## 2022-04-24 DIAGNOSIS — D509 Iron deficiency anemia, unspecified: Secondary | ICD-10-CM

## 2022-04-24 DIAGNOSIS — E1140 Type 2 diabetes mellitus with diabetic neuropathy, unspecified: Secondary | ICD-10-CM | POA: Diagnosis not present

## 2022-04-24 DIAGNOSIS — E1169 Type 2 diabetes mellitus with other specified complication: Secondary | ICD-10-CM | POA: Insufficient documentation

## 2022-04-24 DIAGNOSIS — K219 Gastro-esophageal reflux disease without esophagitis: Secondary | ICD-10-CM

## 2022-04-24 DIAGNOSIS — E039 Hypothyroidism, unspecified: Secondary | ICD-10-CM | POA: Insufficient documentation

## 2022-04-24 DIAGNOSIS — E1129 Type 2 diabetes mellitus with other diabetic kidney complication: Secondary | ICD-10-CM

## 2022-04-24 DIAGNOSIS — R809 Proteinuria, unspecified: Secondary | ICD-10-CM

## 2022-04-24 DIAGNOSIS — E785 Hyperlipidemia, unspecified: Secondary | ICD-10-CM

## 2022-04-24 DIAGNOSIS — R42 Dizziness and giddiness: Secondary | ICD-10-CM | POA: Insufficient documentation

## 2022-04-24 DIAGNOSIS — Z794 Long term (current) use of insulin: Secondary | ICD-10-CM | POA: Insufficient documentation

## 2022-04-24 DIAGNOSIS — W19XXXA Unspecified fall, initial encounter: Secondary | ICD-10-CM | POA: Insufficient documentation

## 2022-04-24 MED ORDER — LISINOPRIL 5 MG PO TABS: ORAL_TABLET | ORAL | 3 refills | Status: DC

## 2022-04-24 MED ORDER — OMEPRAZOLE 20 MG PO CPDR: 20.0000 mg | DELAYED_RELEASE_CAPSULE | Freq: Every day | ORAL | 3 refills | Status: DC

## 2022-04-24 NOTE — Assessment & Plan Note (Signed)
Will refer to PT d/t hx of dizziness and fall ?Acute on chronic condition, worsening  ?

## 2022-04-24 NOTE — Assessment & Plan Note (Signed)
Chronic, stable ?Due for refill of prilosec ?

## 2022-04-24 NOTE — Progress Notes (Signed)
?  ? ? ?Established patient visit ? ? ?I,April Miller,acting as a scribe for [name], FNP.,have documented all relevant documentation on the behalf of [name], FNP,as directed by  [name], FNP while in the presence of [name], FNP. ? ? ?Patient: Gabriella Marsh   DOB: [birth year]   56 y.o. Female  MRN: [id number] ?Visit Date: 04/24/2022 ? ?Today's healthcare provider: [name], FNP  ?Re Introduced to nurse practitioner role and practice setting.  All questions answered.  Discussed provider/patient relationship and expectations. ? ? ?Chief Complaint  ?Patient presents with  ?? Follow-up  ?? Diabetes  ?? Hypertension  ?? Hyperlipidemia  ?? Hypothyroidism  ? ?Subjective  ?  ?HPI  ?Diabetes Mellitus Type II, follow-up ? ?Lab Results  ?Component Value Date  ? HGBA1C 11.8 (H) 01/26/2022  ? HGBA1C 9.2 (A) 10/22/2021  ? HGBA1C 9.4 (H) 03/05/2021  ? ?Last seen for diabetes 3 months ago.  ?Management since then includes; started Lantus ?She reports fair compliance with treatment. ?She is not having side effects. none ? ?Home blood sugar records: fasting range: not checking ? ?Episodes of hypoglycemia? No none ?  ?Current insulin regiment: n/a ?Most Recent Eye Exam: due ? ?--------------------------------------------------------------------------------------------------- ?Hypertension, follow-up ? ?BP Readings from Last 3 Encounters:  ?04/24/22 114/76  ?04/21/22 124/83  ?02/25/22 (!) 147/90  ? Wt Readings from Last 3 Encounters:  ?04/24/22 233 lb (105.7 kg)  ?04/21/22 240 lb (108.9 kg)  ?02/25/22 235 lb (106.6 kg)  ?  ? ?She was last seen for hypertension 3 months ago.  ?BP at that visit was 107/73. ?Management since that visit includes; on lisinopril and triamterene-hydrochlorothiazide. ?She reports good compliance with treatment. ?She is not having side effects. none ?She is not exercising. ?She is not adherent to low salt diet.   ?Outside blood pressures are normal. ? ?She does not  smoke. ? ?Use of agents associated with hypertension: none.  ? ?--------------------------------------------------------------------------------------------------- ?Lipid/Cholesterol, follow-up ? ?Last Lipid Panel: ?Lab Results  ?Component Value Date  ? CHOL 150 01/03/2021  ? [place] 64 01/03/2021  ? HDL 38 (L) 01/03/2021  ? TRIG 307 (H) 01/03/2021  ? ? ?She was last seen for this 01/03/2021.  ?Management since that visit includes, on atorvastatin. ? ?She reports good compliance with treatment. ?She is not having side effects. none ? ?She is following a Regular diet. ?Current exercise: none ? ?Last metabolic panel ?Lab Results  ?Component Value Date  ? GLUCOSE 203 (H) 11/12/2021  ? NA 135 11/12/2021  ? K 4.2 11/12/2021  ? BUN 14 11/12/2021  ? CREATININE 0.71 11/12/2021  ? EGFR 101 11/12/2021  ? GFRNONAA >60 07/25/2021  ? CALCIUM 8.2 (L) 11/12/2021  ? AST 23 11/12/2021  ? ALT 12 07/24/2021  ? ?The 10-year ASCVD risk score (Arnett DK, et al., 2019) is: 4.3% ? ?--------------------------------------------------------------------------------------------------- ?Hypothyroid, follow-up ? ?Lab Results  ?Component Value Date  ? TSH 1.750 01/03/2021  ? TSH 1.530 01/23/2019  ? TSH 1.220 01/21/2018  ? ? ?Wt Readings from Last 3 Encounters:  ?04/24/22 233 lb (105.7 kg)  ?04/21/22 240 lb (108.9 kg)  ?02/25/22 235 lb (106.6 kg)  ? ? ?She was last seen for hypothyroid 01/03/2021.  ?Management since that visit includes; on levothyroxine 150 mcg. ?She reports good compliance with treatment. ?She is not having side effects. none ? ?----------------------------------------------------------------------------------------- ? ? ?Medications: ?Outpatient Medications Prior to Visit  ?Medication Sig  ?? atorvastatin (LIPITOR) 20 MG tablet Take 1 tablet (20 mg  total) by mouth daily.  ?? clopidogrel (PLAVIX) 75 MG tablet TAKE 1 TABLET(75 MG) BY MOUTH DAILY  ?? cyclobenzaprine (FLEXERIL) 5 MG tablet Take 1 tablet (5 mg total) by mouth 3 (three)  times daily as needed for muscle spasms.  ?? fluticasone (FLONASE) 50 MCG/ACT nasal spray Place 2 sprays into both nostrils daily.  ?? furosemide (LASIX) 20 MG tablet TAKE 2 TABLETS(40 MG) BY MOUTH DAILY AS NEEDED FOR FLUID RETENTION OR SWELLING  ?? gabapentin (NEURONTIN) 100 MG capsule TAKE 2 CAPSULES THREE TIMES A DAY ALONG WITH 600 MG THREE TIMES A DAY FOR A TOTAL DAILY DOSE OF 2400 MG DAILY  ?? gabapentin (NEURONTIN) 600 MG tablet Take 1 tablet (600 mg total) by mouth 3 (three) times daily.  ?? glipiZIDE (GLUCOTROL) 10 MG tablet TAKE ONE-HALF TABLET BY  MOUTH TWICE DAILY BEFORE  MEALS  ?? insulin glargine (LANTUS) 100 UNIT/ML Solostar Pen Inject 20 Units into the skin at bedtime. Titrate to a fasting blood sugar of 130-180.  ?? Insulin Pen Needle 31G X 5 MM MISC 1 each by Does not apply route daily.  ?? levothyroxine (SYNTHROID) 150 MCG tablet Take 1 tablet (150 mcg total) by mouth daily at 6 (six) AM. Please keep next appt for labs.  ?? Melatonin 10 MG TABS Take 10 mg by mouth at bedtime.  ?? metFORMIN (GLUCOPHAGE) 500 MG tablet Take 2 tablets (1,000 mg total) by mouth 2 (two) times daily with a meal.  ?? nortriptyline (PAMELOR) 25 MG capsule TAKE 2 CAPSULES BY MOUTH AT BEDTIME  ?? [START ON 04/27/2022] oxyCODONE-acetaminophen (PERCOCET) 5-325 MG tablet Take 1-2 tablets by mouth 2 (two) times daily as needed for severe pain. Must last 30 days.  ?? [START ON 05/27/2022] oxyCODONE-acetaminophen (PERCOCET) 5-325 MG tablet Take 1-2 tablets by mouth 2 (two) times daily as needed for severe pain. Must last 30 days.  ?? [START ON 06/26/2022] oxyCODONE-acetaminophen (PERCOCET) 5-325 MG tablet Take 1-2 tablets by mouth 2 (two) times daily as needed for severe pain. Must last 30 days.  ?? traZODone (DESYREL) 50 MG tablet Take 2 tablets (100 mg total) by mouth at bedtime.  ?? triamterene-hydrochlorothiazide (MAXZIDE-25) 37.5-25 MG tablet TAKE 1 TABLET BY MOUTH  DAILY  ?? [DISCONTINUED] amoxicillin-clavulanate (AUGMENTIN)  875-125 MG tablet Take 1 tablet by mouth 2 (two) times daily for 7 days.  ?? [DISCONTINUED] lisinopril (ZESTRIL) 5 MG tablet TAKE 1 TABLET(5 MG) BY MOUTH DAILY  ?? [DISCONTINUED] omeprazole (PRILOSEC) 20 MG capsule Take 1 capsule (20 mg total) by mouth daily.  ?? TRULICITY 1.5 UU/7.2ZD SOPN ADMINISTER 1.5 MG UNDER THE SKIN 1 TIME A WEEK (Patient not taking: Reported on 02/20/2022)  ?? [DISCONTINUED] benzonatate (TESSALON) 200 MG capsule Take 1 capsule (200 mg total) by mouth 2 (two) times daily as needed for cough. (Patient not taking: Reported on 04/21/2022)  ? ?No facility-administered medications prior to visit.  ? ? ?Review of Systems  ?Constitutional:  Negative for appetite change, chills, fatigue and fever.  ?Respiratory:  Negative for chest tightness and shortness of breath.   ?Cardiovascular:  Negative for chest pain and palpitations.  ?Gastrointestinal:  Negative for abdominal pain, nausea and vomiting.  ?Neurological:  Negative for dizziness and weakness.  ? ? ?  Objective  ?  ?BP 114/76 (BP Location: Right Arm, Patient Position: Sitting, Cuff Size: Large)   Pulse 86   Resp 18   Wt 233 lb (105.7 kg)   SpO2 90%   BMI 33.43 kg/m?  ? ? ?Physical Exam ?Vitals  and nursing note reviewed.  ?Constitutional:   ?   General: She is not in acute distress. ?   Appearance: Normal appearance. She is obese. She is not ill-appearing, toxic-appearing or diaphoretic.  ?HENT:  ?   Head: Normocephalic and atraumatic.  ?Cardiovascular:  ?   Rate and Rhythm: Normal rate and regular rhythm.  ?   Pulses: Normal pulses.  ?   Heart sounds: Normal heart sounds. No murmur heard. ?  No friction rub. No gallop.  ?Pulmonary:  ?   Effort: Pulmonary effort is normal. No respiratory distress.  ?   Breath sounds: Normal breath sounds. No stridor. No wheezing, rhonchi or rales.  ?Chest:  ?   Chest wall: No tenderness.  ?Abdominal:  ?   General: Bowel sounds are normal.  ?   Palpations: Abdomen is soft.  ?Musculoskeletal:     ?   General: No  swelling, tenderness, deformity or signs of injury. Normal range of motion.  ?   Right lower leg: Edema present.  ?   Left lower leg: No edema.  ?Skin: ?   General: Skin is warm and dry.  ?   Capillary Refill: Capillary

## 2022-04-24 NOTE — Assessment & Plan Note (Signed)
Chronic, unstable ?Repeat NFLP ?On lipitor at 20 mg ?The 10-year ASCVD risk score (Arnett DK, et al., 2019) is: 4.3% ?  Values used to calculate the score: ?    Age: 56 years ?    Sex: Female ?    Is Non-Hispanic African American: No ?    Diabetic: Yes ?    Tobacco smoker: No ?    Systolic Blood Pressure: 377 mmHg ?    Is BP treated: Yes ?    HDL Cholesterol: 38 mg/dL ?    Total Cholesterol: 150 mg/dL ? ?

## 2022-04-24 NOTE — Assessment & Plan Note (Signed)
Chronic, unstable ?Has not been checking her BG ?Has a CGM; not using ?Stopped her trulicity when started on lantus ?No side effects with lantus 20 u qHS ?

## 2022-04-24 NOTE — Assessment & Plan Note (Signed)
Fall at home; reports now healed abrasion to L knee ?Was not seen at ER/UC/PCP ?Will refer to PT to assist with balance ?

## 2022-04-24 NOTE — Assessment & Plan Note (Signed)
Chronic, stable ?Over due for TSH and Free T4 ?Ongoing neurological complaints including recent fall ?Previously taking 150 mcg/day ?

## 2022-04-24 NOTE — Assessment & Plan Note (Signed)
Chronic, unstable ?Repeat urine micro ?

## 2022-04-25 LAB — COMPREHENSIVE METABOLIC PANEL
ALT: 15 IU/L (ref 0–32)
AST: 15 IU/L (ref 0–40)
Albumin/Globulin Ratio: 1.7 (ref 1.2–2.2)
Albumin: 4.6 g/dL (ref 3.8–4.9)
Alkaline Phosphatase: 75 IU/L (ref 44–121)
BUN/Creatinine Ratio: 21 (ref 9–23)
BUN: 18 mg/dL (ref 6–24)
Bilirubin Total: 0.8 mg/dL (ref 0.0–1.2)
CO2: 27 mmol/L (ref 20–29)
Calcium: 8.9 mg/dL (ref 8.7–10.2)
Chloride: 84 mmol/L — ABNORMAL LOW (ref 96–106)
Creatinine, Ser: 0.85 mg/dL (ref 0.57–1.00)
Globulin, Total: 2.7 g/dL (ref 1.5–4.5)
Glucose: 327 mg/dL — ABNORMAL HIGH (ref 70–99)
Potassium: 4.4 mmol/L (ref 3.5–5.2)
Sodium: 130 mmol/L — ABNORMAL LOW (ref 134–144)
Total Protein: 7.3 g/dL (ref 6.0–8.5)
eGFR: 81 mL/min/{1.73_m2} (ref >59)

## 2022-04-25 LAB — TSH+FREE T4
Free T4: 1.26 ng/dL (ref 0.82–1.77)
TSH: 58.2 u[IU]/mL — ABNORMAL HIGH (ref 0.450–4.500)

## 2022-04-25 LAB — CBC WITH DIFFERENTIAL/PLATELET
Basophils Absolute: 0.1 10*3/uL (ref 0.0–0.2)
Basos: 1 %
EOS (ABSOLUTE): 0.2 10*3/uL (ref 0.0–0.4)
Eos: 2 %
Hematocrit: 41.4 % (ref 34.0–46.6)
Hemoglobin: 13.5 g/dL (ref 11.1–15.9)
Immature Grans (Abs): 0.2 10*3/uL — ABNORMAL HIGH (ref 0.0–0.1)
Immature Granulocytes: 2 %
Lymphocytes Absolute: 3.1 10*3/uL (ref 0.7–3.1)
Lymphs: 31 %
MCH: 27.2 pg (ref 26.6–33.0)
MCHC: 32.6 g/dL (ref 31.5–35.7)
MCV: 83 fL (ref 79–97)
Monocytes Absolute: 0.6 10*3/uL (ref 0.1–0.9)
Monocytes: 6 %
Neutrophils Absolute: 5.8 10*3/uL (ref 1.4–7.0)
Neutrophils: 58 %
Platelets: 448 10*3/uL (ref 150–450)
RBC: 4.97 x10E6/uL (ref 3.77–5.28)
RDW: 13.1 % (ref 11.7–15.4)
WBC: 10 10*3/uL (ref 3.4–10.8)

## 2022-04-25 LAB — HEMOGLOBIN A1C
Est. average glucose Bld gHb Est-mCnc: 329 mg/dL
Hgb A1c MFr Bld: 13.1 % — ABNORMAL HIGH (ref 4.8–5.6)

## 2022-04-25 LAB — LIPID PANEL
Chol/HDL Ratio: 5 ratio — ABNORMAL HIGH (ref 0.0–4.4)
Cholesterol, Total: 166 mg/dL (ref 100–199)
HDL: 33 mg/dL — ABNORMAL LOW (ref >39)
LDL Chol Calc (NIH): 66 mg/dL (ref 0–99)
Triglycerides: 430 mg/dL — ABNORMAL HIGH (ref 0–149)
VLDL Cholesterol Cal: 67 mg/dL — ABNORMAL HIGH (ref 5–40)

## 2022-04-25 LAB — MICROALBUMIN / CREATININE URINE RATIO
Creatinine, Urine: 102.3 mg/dL
Microalb/Creat Ratio: 7 mg/g creat (ref 0–29)
Microalbumin, Urine: 7.4 ug/mL

## 2022-04-26 ENCOUNTER — Encounter: Payer: Self-pay | Admitting: Family Medicine

## 2022-04-27 ENCOUNTER — Other Ambulatory Visit: Payer: Self-pay | Admitting: Family Medicine

## 2022-04-27 DIAGNOSIS — E039 Hypothyroidism, unspecified: Secondary | ICD-10-CM

## 2022-04-27 DIAGNOSIS — E1140 Type 2 diabetes mellitus with diabetic neuropathy, unspecified: Secondary | ICD-10-CM

## 2022-04-27 DIAGNOSIS — E1169 Type 2 diabetes mellitus with other specified complication: Secondary | ICD-10-CM

## 2022-04-27 MED ORDER — TRULICITY 0.75 MG/0.5ML ~~LOC~~ SOAJ: 0.7500 mg | SUBCUTANEOUS | 0 refills | Status: DC

## 2022-04-27 MED ORDER — GLIPIZIDE 10 MG PO TABS: 10.0000 mg | ORAL_TABLET | Freq: Two times a day (BID) | ORAL | 3 refills | Status: DC

## 2022-04-27 MED ORDER — TRULICITY 1.5 MG/0.5ML ~~LOC~~ SOAJ: 1.5000 mg | SUBCUTANEOUS | 0 refills | Status: DC

## 2022-04-27 MED ORDER — LEVOTHYROXINE SODIUM 200 MCG PO TABS: 200.0000 ug | ORAL_TABLET | Freq: Every day | ORAL | 3 refills | Status: DC

## 2022-04-28 ENCOUNTER — Other Ambulatory Visit: Payer: Self-pay | Admitting: Family Medicine

## 2022-05-04 ENCOUNTER — Encounter: Payer: Self-pay | Admitting: Family Medicine

## 2022-05-04 ENCOUNTER — Other Ambulatory Visit: Payer: Self-pay

## 2022-05-04 MED ORDER — TRULICITY 0.75 MG/0.5ML ~~LOC~~ SOAJ: 0.7500 mg | SUBCUTANEOUS | 0 refills | Status: DC

## 2022-05-04 NOTE — Telephone Encounter (Signed)
Patient sent my chart message requesting prescription for trulicity be sent to [facility] in [place]. Original prescription was sent in 04/27/22 [facility], will send trulicity to [facility]. KW ?

## 2022-05-05 ENCOUNTER — Ambulatory Visit: Payer: BLUE CROSS/BLUE SHIELD | Admitting: Podiatry

## 2022-05-05 DIAGNOSIS — E119 Type 2 diabetes mellitus without complications: Secondary | ICD-10-CM

## 2022-05-05 NOTE — Progress Notes (Signed)
? ?HPI: 56 y.o. female PMHx diabetes mellitus; uncontrolled.  Last A1c 04/24/2022 was 13.1.  Patient states that she was referred here from her primary care doctor for routine diabetic foot exam.  She does experience pins-and-needles burning sensation and neuropathy.  She presents for further treatment and evaluation ? ?Past Medical History:  ?Diagnosis Date  ? Arthritis   ? knees  ? Blood clotting disorder ([place])   ? on toe   ? Degenerative disc disease, lumbar   ? Diabetes mellitus without complication ([place])   ? GERD (gastroesophageal reflux disease)   ? Headache   ? daily - AM (has not been able to have SPG blocks lately)  ? Hyperlipidemia   ? Hypertension   ? Neuropathy   ? feet  ? Vertigo   ? 3-4x/yr  ? ? ?Past Surgical History:  ?Procedure Laterality Date  ? ABDOMINAL HYSTERECTOMY    ? But still has cervix  ? AMPUTATION TOE Right 07/24/2021  ? Procedure: AMPUTATION TOE;  Surgeon: Sharlotte Alamo, DPM;  Location: [facility] ORS;  Service: Podiatry;  Laterality: Right;  ? CESAREAN SECTION    ? CHOLECYSTECTOMY    ? COLONOSCOPY WITH PROPOFOL N/A 02/20/2022  ? Procedure: COLONOSCOPY WITH PROPOFOL;  Surgeon: [name], MD;  Location: [facility] ENDOSCOPY;  Service: Gastroenterology;  Laterality: N/A;  ? left arm frature  10/12/2020  ? OVARIAN CYST SURGERY    ? PARATHYROIDECTOMY  03/28/2021  ? Procedure: PARATHYROIDECTOMY AUTOTRANSPLANT;  Surgeon: Fredirick Maudlin, MD;  Location: [facility] ORS;  Service: General;;  ? SHOULDER SURGERY Left 12/26/014  ? Dr. Little Ishikawa, [place]  ? THYROIDECTOMY N/A 03/28/2021  ? Procedure: THYROIDECTOMY, total;  Surgeon: Fredirick Maudlin, MD;  Location: [facility] ORS;  Service: General;  Laterality: N/A;  Provider requesting 3 hours /180 minutes for procedure  ? TONSILLECTOMY AND ADENOIDECTOMY    ? UTERINE FIBROID SURGERY    ? ? ?Allergies  ?Allergen Reactions  ? Celecoxib Shortness Of Breath  ? Pregabalin Shortness Of Breath  ? Buspar [Buspirone] Other (See Comments)  ?  Tachycardia  ? Citalopram Cough  ? Other Cough  ?   [place]  ? ?  ?Physical Exam: ?General: The patient is alert and oriented x3 in no acute distress. ? ?Dermatology: Skin is warm, dry and supple bilateral lower extremities. Negative for open lesions or macerations.  Amputation site healed ? ?Vascular: Palpable pedal pulses bilaterally. Capillary refill within normal limits.  Negative for any significant edema or erythema ? ?Neurological: Light touch and protective threshold diminished bilateral ? ?Musculoskeletal Exam: History of prior amputation right great toe 07/24/2021 performed by Dr. Vickii Penna clinic ? ?Assessment: ?1.  Encounter for routine diabetic foot exam ?2.  Diabetes mellitus type 2; uncontrolled, with diabetic neuropathy ? ? ?Plan of Care:  ?1. Patient evaluated.  ?2.  Comprehensive diabetic foot exam performed today ?3.  The patient states that she has never had diabetic shoes or insoles.  Appointment with Pedorthist for diabetic shoes and insoles ?4.  Stressed the importance of reducing blood glucose levels and working with her PCP for better diabetic control which should help alleviate some of her neuropathy symptoms  ?5.  Return to clinic annually ?  ?  ?Edrick Kins, DPM ?[place] ? ?Dr. Edrick Kins, DPM  ?  ?[address]                                        ?  [place], [place] [address]                ?Office [phone]  ?Fax [phone] ? ? ? ? ?

## 2022-05-16 ENCOUNTER — Encounter: Payer: Self-pay | Admitting: Family Medicine

## 2022-05-16 DIAGNOSIS — W19XXXD Unspecified fall, subsequent encounter: Secondary | ICD-10-CM

## 2022-05-20 ENCOUNTER — Ambulatory Visit
Admission: RE | Admit: 2022-05-20 | Discharge: 2022-05-20 | Disposition: A | Discharge status: Home / Self Care | Payer: BLUE CROSS/BLUE SHIELD | Source: Ambulatory Visit | Attending: Family Medicine | Admitting: Family Medicine

## 2022-05-20 ENCOUNTER — Ambulatory Visit
Admission: RE | Admit: 2022-05-20 | Discharge: 2022-05-20 | Disposition: A | Discharge status: Home / Self Care | Payer: BLUE CROSS/BLUE SHIELD | Attending: Family Medicine | Admitting: Family Medicine

## 2022-05-20 DIAGNOSIS — R296 Repeated falls: Secondary | ICD-10-CM | POA: Insufficient documentation

## 2022-05-20 DIAGNOSIS — Z9181 History of falling: Secondary | ICD-10-CM | POA: Diagnosis not present

## 2022-05-20 DIAGNOSIS — R600 Localized edema: Secondary | ICD-10-CM | POA: Insufficient documentation

## 2022-05-20 DIAGNOSIS — M1712 Unilateral primary osteoarthritis, left knee: Secondary | ICD-10-CM | POA: Insufficient documentation

## 2022-05-20 DIAGNOSIS — M25462 Effusion, left knee: Secondary | ICD-10-CM | POA: Insufficient documentation

## 2022-05-20 DIAGNOSIS — W19XXXD Unspecified fall, subsequent encounter: Secondary | ICD-10-CM | POA: Diagnosis not present

## 2022-05-20 DIAGNOSIS — M25562 Pain in left knee: Secondary | ICD-10-CM | POA: Diagnosis not present

## 2022-05-22 ENCOUNTER — Encounter: Payer: Self-pay | Admitting: Family Medicine

## 2022-05-25 ENCOUNTER — Ambulatory Visit: Payer: BLUE CROSS/BLUE SHIELD

## 2022-05-25 NOTE — Progress Notes (Signed)
Patient seen for evaluation for diabetic shoes and insoles. Informed patient that private insurance does not typically cover shoes. Discussed financial obligation and patient mentioned she is going to be filing for disability paperwork to be placed on Medicare. Patient would like to table discussion of shoes and insoles at this time until her insurance situation changes. She will bring this back to Dr. Amalia Hailey' attention hopefully within the next year.

## 2022-05-26 ENCOUNTER — Encounter: Payer: Self-pay | Admitting: Family Medicine

## 2022-05-27 ENCOUNTER — Encounter: Payer: Self-pay | Admitting: Student in an Organized Health Care Education/Training Program

## 2022-05-27 ENCOUNTER — Emergency Department
Admission: EM | Admit: 2022-05-27 | Discharge: 2022-05-28 | Disposition: A | Discharge status: Home / Self Care | Payer: BLUE CROSS/BLUE SHIELD | Attending: Emergency Medicine | Admitting: Emergency Medicine

## 2022-05-27 ENCOUNTER — Emergency Department: Payer: BLUE CROSS/BLUE SHIELD

## 2022-05-27 ENCOUNTER — Ambulatory Visit (HOSPITAL_BASED_OUTPATIENT_CLINIC_OR_DEPARTMENT_OTHER): Payer: BLUE CROSS/BLUE SHIELD | Admitting: Student in an Organized Health Care Education/Training Program

## 2022-05-27 ENCOUNTER — Other Ambulatory Visit: Payer: Self-pay

## 2022-05-27 VITALS — BP 139/86 | HR 81 | Temp 97.4°F | Resp 16 | Ht 70.0 in | Wt 233.0 lb

## 2022-05-27 DIAGNOSIS — E1140 Type 2 diabetes mellitus with diabetic neuropathy, unspecified: Secondary | ICD-10-CM | POA: Insufficient documentation

## 2022-05-27 DIAGNOSIS — E1142 Type 2 diabetes mellitus with diabetic polyneuropathy: Secondary | ICD-10-CM | POA: Insufficient documentation

## 2022-05-27 DIAGNOSIS — M792 Neuralgia and neuritis, unspecified: Secondary | ICD-10-CM | POA: Insufficient documentation

## 2022-05-27 DIAGNOSIS — I10 Essential (primary) hypertension: Secondary | ICD-10-CM | POA: Diagnosis not present

## 2022-05-27 DIAGNOSIS — R29898 Other symptoms and signs involving the musculoskeletal system: Secondary | ICD-10-CM

## 2022-05-27 DIAGNOSIS — M5417 Radiculopathy, lumbosacral region: Secondary | ICD-10-CM | POA: Insufficient documentation

## 2022-05-27 DIAGNOSIS — Z23 Encounter for immunization: Secondary | ICD-10-CM | POA: Diagnosis not present

## 2022-05-27 DIAGNOSIS — G8929 Other chronic pain: Secondary | ICD-10-CM | POA: Insufficient documentation

## 2022-05-27 DIAGNOSIS — M6281 Muscle weakness (generalized): Secondary | ICD-10-CM | POA: Diagnosis present

## 2022-05-27 LAB — TSH: TSH: 115.542 u[IU]/mL — ABNORMAL HIGH (ref 0.350–4.500)

## 2022-05-27 LAB — COMPREHENSIVE METABOLIC PANEL
ALT: 20 U/L (ref 0–44)
AST: 24 U/L (ref 15–41)
Albumin: 4 g/dL (ref 3.5–5.0)
Alkaline Phosphatase: 55 U/L (ref 38–126)
Anion gap: 15 (ref 5–15)
BUN: 16 mg/dL (ref 6–20)
CO2: 32 mmol/L (ref 22–32)
Calcium: 9.4 mg/dL (ref 8.9–10.3)
Chloride: 87 mmol/L — ABNORMAL LOW (ref 98–111)
Creatinine, Ser: 0.84 mg/dL (ref 0.44–1.00)
GFR, Estimated: >60 mL/min (ref >60)
Glucose, Bld: 240 mg/dL — ABNORMAL HIGH (ref 70–99)
Potassium: 3.8 mmol/L (ref 3.5–5.1)
Sodium: 134 mmol/L — ABNORMAL LOW (ref 135–145)
Total Bilirubin: 1.5 mg/dL — ABNORMAL HIGH (ref 0.3–1.2)
Total Protein: 7.6 g/dL (ref 6.5–8.1)

## 2022-05-27 LAB — CBC WITH DIFFERENTIAL/PLATELET
Abs Immature Granulocytes: 0.14 10*3/uL — ABNORMAL HIGH (ref 0.00–0.07)
Basophils Absolute: 0.1 10*3/uL (ref 0.0–0.1)
Basophils Relative: 1 %
Eosinophils Absolute: 0.2 10*3/uL (ref 0.0–0.5)
Eosinophils Relative: 2 %
HCT: 42.3 % (ref 36.0–46.0)
Hemoglobin: 14 g/dL (ref 12.0–15.0)
Immature Granulocytes: 1 %
Lymphocytes Relative: 35 %
Lymphs Abs: 3.7 10*3/uL (ref 0.7–4.0)
MCH: 27.6 pg (ref 26.0–34.0)
MCHC: 33.1 g/dL (ref 30.0–36.0)
MCV: 83.4 fL (ref 80.0–100.0)
Monocytes Absolute: 0.6 10*3/uL (ref 0.1–1.0)
Monocytes Relative: 6 %
Neutro Abs: 5.9 10*3/uL (ref 1.7–7.7)
Neutrophils Relative %: 55 %
Platelets: 361 10*3/uL (ref 150–400)
RBC: 5.07 MIL/uL (ref 3.87–5.11)
RDW: 13.9 % (ref 11.5–15.5)
WBC: 10.6 10*3/uL — ABNORMAL HIGH (ref 4.0–10.5)
nRBC: 0 % (ref 0.0–0.2)

## 2022-05-27 LAB — MAGNESIUM: Magnesium: 1.7 mg/dL (ref 1.7–2.4)

## 2022-05-27 LAB — CK: Total CK: 122 U/L (ref 38–234)

## 2022-05-27 LAB — T4, FREE: Free T4: <0.25 ng/dL — ABNORMAL LOW (ref 0.61–1.12)

## 2022-05-27 MED ORDER — GADOBUTROL 1 MMOL/ML IV SOLN
10.0000 mL | Freq: Once | INTRAVENOUS | Status: AC | PRN
  Administered 2022-05-27: 10 mL via INTRAVENOUS

## 2022-05-27 MED ORDER — CAPSAICIN-CLEANSING GEL 8 % EX KIT
4.0000 | PACK | Freq: Once | CUTANEOUS | Status: AC
  Administered 2022-05-27: 4 via TOPICAL
  Filled 2022-05-27: qty 4

## 2022-05-27 MED ORDER — TETANUS-DIPHTH-ACELL PERTUSSIS 5-2.5-18.5 LF-MCG/0.5 IM SUSY
0.5000 mL | PREFILLED_SYRINGE | Freq: Once | INTRAMUSCULAR | Status: AC
  Administered 2022-05-27: 0.5 mL via INTRAMUSCULAR
  Filled 2022-05-27: qty 0.5

## 2022-05-27 MED ORDER — GABAPENTIN 800 MG PO TABS
800.0000 mg | ORAL_TABLET | Freq: Once | ORAL | Status: DC
  Filled 2022-05-27: qty 1

## 2022-05-27 MED ORDER — GABAPENTIN 400 MG PO CAPS
800.0000 mg | ORAL_CAPSULE | Freq: Once | ORAL | Status: AC
Start: 2022-05-27 — End: 2022-05-27
  Administered 2022-05-27: 800 mg via ORAL
  Filled 2022-05-27: qty 2

## 2022-05-27 NOTE — ED Triage Notes (Addendum)
Pt come with c/o left leg pain. Pt states she has had multiple falls and not able to really use her leg now. Pt states she can't stand or walk on her left leg now. Pt state she is now using walker and wheelchair. Pt states this has been going on for about 4 weeks now.  Pt denies any injuries prior to this. Pt states now it is just so weak. Pt did have xray that was negative. Pt denies any MRI or [place]. Pt states she also went to foot doc and was told she has good pulses and blood flow.

## 2022-05-27 NOTE — ED Provider Notes (Signed)
-----------------------------------------   8:46 PM on 05/27/2022 -----------------------------------------  Blood pressure 125/78, pulse 81, temperature 98 F (36.7 C), resp. rate 18, SpO2 100 %.  Assuming care from Dr. Jari Pigg.  In short, Gabriella Marsh is a 56 y.o. female with a chief complaint of Leg Pain .  Refer to the original H&P for additional details.  The current plan of care is to await MRI results and disposition appropriately.  Patient with some acute onset of left leg weakness and disability, presents for evaluation.  The current plan is to consider inpatient management if MRI is negative for neurology consultation urgently.  Patient with new onset leg weakness may be otherwise unsafe at home given her current presentation. ____________________________________________   RADIOLOGY  MRI Brain  IMPRESSION: 1. No acute intracranial abnormality. 2. Multifocal hyperintense T2-weighted signal within the white matter, nonspecific and unchanged.  MRI Thoracic   IMPRESSION: 1. Moderate right and mild left foraminal stenosis at T9-10 secondary to facet hypertrophy. 2. Shallow central disc protrusions at C5-6 and T6-7 without significant stenosis. 3. 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 4. Small right pleural effusion./   Lumbar Spine  IMPRESSION: 1. Progressive mild to moderate subarticular and foraminal stenosis at L5-S1, left greater than right. 2. Slight progression of retrolisthesis at L5-S1. 3. Rightward disc protrusion at L4-5 without significant stenosis. 4. Mild facet hypertrophy at L3-4 without significant disc protrusion or stenosis. ____________________________________________  PROCEDURES  Procedures ____________________________________________  INITIAL IMPRESSION / ASSESSMENT AND PLAN / ED COURSE  Patient to the ED for evaluation of 3+ weeks of left leg weakness.  Patient with a history of degenerative disc disease, presents for evaluation.   Evaluation MRI of the brain does not reveal any acute changes or brain lesions.  MRI of the thoracic and lumbar spine reveals some multilevel DDD without any significant spinal stenosis.  There is some evidence of facet hypertrophy, retrolisthesis, and some mild to moderate subarticular foraminal stenosis left greater than right at the L5-S1 level.  This may likely represent some the patient's progressive weakness.  Patient along with her husband were given the option to ask questions related to the MRI results.  We did discuss admission with the possibility of being evaluated by neurology sooner than her scheduled outpatient appointment.  They decided to discharge at this time, and will have close follow-up with neurology and hope that having dedicated imaging done will afford them earlier appointment.  Return precautions were again discussed, patient will be given a emergency prescription for her thyroid medicine as requested.  Patient discharged in stable condition with follow-up instructions.  Gabriella Marsh was evaluated in Emergency Department on 05/28/2022 for the symptoms described in the history of present illness. She was evaluated in the context of the global COVID-19 pandemic, which necessitated consideration that the patient might be at risk for infection with the SARS-CoV-2 virus that causes COVID-19. Institutional protocols and algorithms that pertain to the evaluation of patients at risk for COVID-19 are in a state of rapid change based on information released by regulatory bodies including the CDC and federal and state organizations. These policies and algorithms were followed during the patient's care in the ED. ____________________________________________  FINAL CLINICAL IMPRESSION(S) / ED DIAGNOSES  Final diagnoses:  Weakness of left lower extremity  Lumbosacral radiculopathy at S1      Jarelly Rinck, Dannielle Karvonen, PA-C 05/28/22 0054    Vanessa Lovington, MD 05/28/22 1358

## 2022-05-27 NOTE — Progress Notes (Signed)
PROVIDER NOTE: Interpretation of information contained herein should be left to medically-trained personnel. Specific patient instructions are provided elsewhere under "Patient Instructions" section of medical record. This document was created in part using STT-dictation technology, any transcriptional errors that may result from this process are unintentional.  Patient: Gabriella Marsh Type: Established DOB: Nov 14, 1966 MRN: [id number] PCP: [name], FNP  Service: Procedure DOS: 05/27/2022 Setting: Ambulatory Location: Ambulatory outpatient facility Delivery: Face-to-face Provider: Gillis Santa, MD Specialty: Interventional Pain Management Specialty designation: 09 Location: Outpatient facility Ref. Prov.: [name], FNP    Primary Reason for Visit: Interventional Pain Management Treatment. CC: foot pain    Procedure #1:   Qutenza Neurolysis for PDN     1. Diabetic peripheral neuropathy ([place])   2. Chronic peripheral neuropathic pain    NAS-11 Pain score:   Pre-procedure: 3 /10   Post-procedure: 3 /10   Patient has a history of diabetic peripheral neuropathy.  We discussed Qutenza capsaicin topical therapy for her condition.  The patient does have multiple comorbidities.  We discussed the benefits of topical Qutenza which includes not systemic absorption minimizing drug drug interactions, sustained pain relief and nonopioid.  Risk and benefits reviewed and patient would like to proceed.     Pre-op H&P Assessment:  Gabriella Marsh is a 56 y.o. (year old), female patient, seen today for interventional treatment. She  has a past surgical history that includes Cesarean section; Shoulder surgery (Left, 12/26/014); Abdominal hysterectomy; Tonsillectomy and adenoidectomy; Uterine fibroid surgery; Ovarian cyst surgery; Cholecystectomy; left arm frature (10/12/2020); Thyroidectomy (N/A, 03/28/2021); Parathyroidectomy (03/28/2021); Amputation toe (Right, 07/24/2021); and Colonoscopy with  propofol (N/A, 02/20/2022). Gabriella Marsh has a current medication list which includes the following prescription(s): atorvastatin, clopidogrel, cyclobenzaprine, trulicity, fluticasone, furosemide, gabapentin, gabapentin, glipizide, insulin glargine, insulin pen needle, levothyroxine, lisinopril, melatonin, metformin, nortriptyline, omeprazole, oxycodone-acetaminophen, oxycodone-acetaminophen, [START ON 06/26/2022] oxycodone-acetaminophen, trazodone, triamterene-hydrochlorothiazide, and trulicity, and the following Facility-Administered Medications: capsaicin topical system. Her primarily concern today is the No chief complaint on file.  Initial Vital Signs:  Pulse/HCG Rate: 81  Temp: (!) 97.4 F (36.3 C) Resp: 16 BP: 139/86 SpO2: 97 %  BMI: Estimated body mass index is 33.43 kg/m as calculated from the following:   Height as of this encounter: '5\' 10"'$  (1.778 m).   Weight as of this encounter: 233 lb (105.7 kg).  Risk Assessment: Allergies: Reviewed. She is allergic to celecoxib, pregabalin, buspar [buspirone], citalopram, and other.  Allergy Precautions: None required Coagulopathies: Reviewed. None identified.  Blood-thinner therapy: None at this time Active Infection(s): Reviewed. None identified. Gabriella Marsh is afebrile  Site Confirmation: Gabriella Marsh was asked to confirm the procedure and laterality before marking the site Procedure checklist: Completed Consent: Before the procedure and under the influence of no sedative(s), amnesic(s), or anxiolytics, the patient was informed of the treatment options, risks and possible complications. To fulfill our ethical and legal obligations, as recommended by the American Medical Association's Code of Ethics, I have informed the patient of my clinical impression; the nature and purpose of the treatment or procedure; the risks, benefits, and possible complications of the intervention; the alternatives, including doing nothing; the risk(s) and benefit(s) of  the alternative treatment(s) or procedure(s); and the risk(s) and benefit(s) of doing nothing. The patient was provided information about the general risks and possible complications associated with the procedure. These may include, but are not limited to: failure to achieve desired goals, infection, bleeding, organ or nerve damage, allergic reactions, paralysis, and death. In addition, the patient  was informed of those risks and complications associated to the procedure, such as failure to decrease pain; infection; bleeding; organ or nerve damage with subsequent damage to sensory, motor, and/or autonomic systems, resulting in permanent pain, numbness, and/or weakness of one or several areas of the body; allergic reactions; (i.e.: anaphylactic reaction); and/or death. Furthermore, the patient was informed of those risks and complications associated with the medications. These include, but are not limited to: allergic reactions (i.e.: anaphylactic or anaphylactoid reaction(s)); adrenal axis suppression; blood sugar elevation that in diabetics may result in ketoacidosis or comma; water retention that in patients with history of congestive heart failure may result in shortness of breath, pulmonary edema, and decompensation with resultant heart failure; weight gain; swelling or edema; medication-induced neural toxicity; particulate matter embolism and blood vessel occlusion with resultant organ, and/or nervous system infarction; and/or aseptic necrosis of one or more joints. Finally, the patient was informed that Medicine is not an exact science; therefore, there is also the possibility of unforeseen or unpredictable risks and/or possible complications that may result in a catastrophic outcome. The patient indicated having understood very clearly. We have given the patient no guarantees and we have made no promises. Enough time was given to the patient to ask questions, all of which were answered to the patient's  satisfaction. Gabriella Marsh has indicated that she wanted to continue with the procedure. Attestation: I, the ordering provider, attest that I have discussed with the patient the benefits, risks, side-effects, alternatives, likelihood of achieving goals, and potential problems during recovery for the procedure that I have provided informed consent. Date  Time: 05/27/2022  8:58 AM  Pre-Procedure Preparation:  Monitoring: As per clinic protocol. Respiration, ETCO2, SpO2, BP, heart rate and rhythm monitor placed and checked for adequate function Safety Precautions: Patient was assessed for positional comfort and pressure points before starting the procedure. Time-out: I initiated and conducted the "Time-out" before starting the procedure, as per protocol. The patient was asked to participate by confirming the accuracy of the "Time Out" information. Verification of the correct person, site, and procedure were performed and confirmed by me, the nursing staff, and the patient. "Time-out" conducted as per Joint Commission's Universal Protocol (UP.01.01.01). Time: 0920  Description of Procedure:           Area Prepped: Entire foot Region DuraPrep (Iodine Povacrylex [0.7% available iodine] and Isopropyl Alcohol, 74% w/w)                   2,  8% Qutenza patches were applied to each foot, on the plantar and dorsal aspect covering areas that were painful. 4 total patches with used, 2 for each foot Patient tolerated the procedure well without any issues.    Vitals:   05/27/22 0912  BP: 139/86  Pulse: 81  Resp: 16  Temp: (!) 97.4 F (36.3 C)  TempSrc: Temporal  SpO2: 97%  Weight: 233 lb (105.7 kg)  Height: '5\' 10"'$  (1.778 m)    Start Time: 0920 hrs. End Time:   hrs.     Post-operative Assessment:  Post-procedure Vital Signs:  Pulse/HCG Rate: 81  Temp:  (!) 97.4 F (36.3 C) Resp: 16 BP:  139/86 SpO2: 97 %  EBL: None  Complications: No immediate post-treatment complications  observed by team, or reported by patient.  Note: The patient tolerated the entire procedure well. A repeat set of vitals were taken after the procedure and the patient was kept under observation following institutional policy, for this type of procedure.  Post-procedural neurological assessment was performed, showing return to baseline, prior to discharge. The patient was provided with post-procedure discharge instructions, including a section on how to identify potential problems. Should any problems arise concerning this procedure, the patient was given instructions to immediately contact us, at any time, without hesitation. In any case, we plan to contact the patient by telephone for a follow-up status report regarding this interventional procedure.  Comments:  No additional relevant information.  Plan of Care    Chronic Opioid Analgesic:  Percocet 5 mg TID as needed, quantity 75/month   Medications ordered for procedure: Meds ordered this encounter  Medications   capsaicin topical system 8 % patch 4 patch   Medications administered: Elmarie Shiley had no medications administered during this visit.  See the medical record for exact dosing, route, and time of administration.  Follow-up plan:   Return for [facility] appt in July.      Recent Visits Date Type Provider Dept  04/21/22 Office Visit Gillis Santa, MD [facility]  03/23/22 Office Visit Gillis Santa, MD [facility]  Showing recent visits within past 90 days and meeting all other requirements Today's Visits Date Type Provider Dept  05/27/22 Procedure visit Gillis Santa, MD [facility]  Showing today's visits and meeting all other requirements Future Appointments Date Type Provider Dept  07/16/22 Appointment Gillis Santa, MD [facility]  Showing future appointments within next 90 days and meeting all other requirements  Disposition: Discharge home  Discharge (Date  Time):  05/27/2022;   hrs.   Primary Care Physician: [name], FNP Location: [facility] Note by: Gillis Santa, MD Date: 05/27/2022; Time: 10:10 AM  Disclaimer:  Medicine is not an exact science. The only guarantee in medicine is that nothing is guaranteed. It is important to note that the decision to proceed with this intervention was based on the information collected from the patient. The Data and conclusions were drawn from the patient's questionnaire, the interview, and the physical examination. Because the information was provided in large part by the patient, it cannot be guaranteed that it has not been purposely or unconsciously manipulated. Every effort has been made to obtain as much relevant data as possible for this evaluation. It is important to note that the conclusions that lead to this procedure are derived in large part from the available data. Always take into account that the treatment will also be dependent on availability of resources and existing treatment guidelines, considered by other Pain Management Practitioners as being common knowledge and practice, at the time of the intervention. For Medico-Legal purposes, it is also important to point out that variation in procedural techniques and pharmacological choices are the acceptable norm. The indications, contraindications, technique, and results of the above procedure should only be interpreted and judged by a Board-Certified Interventional Pain Specialist with extensive familiarity and expertise in the same exact procedure and technique.

## 2022-05-27 NOTE — Progress Notes (Signed)
Safety precautions to be maintained throughout the outpatient stay will include: orient to surroundings, keep bed in low position, maintain call bell within reach at all times, provide assistance with transfer out of bed and ambulation.  

## 2022-05-27 NOTE — ED Provider Notes (Signed)
[facility] Provider Note    Event Date/Time   First MD Initiated Contact with Patient 05/27/22 1252     (approximate)   History   Leg Pain   HPI  Gabriella Marsh is a 56 y.o. female with history of diabetes, diabetic neuropathy, and multiple comorbidities presents to the emergency department for evaluation of weakness in the left lower extremity.  Patient states that she estimates that for the past 1 month she has had increasing weakness in the left leg.  She states that her knee buckles when she attempts to stand but she also feels weakness in her left thigh.  She also states that when she plants her foot on the floor she does not realize it but she is rolling her left ankle outward.  She does have diabetic neuropathy and does not have good sensation in her feet but this seemed to start with the other symptoms.  She has sustained 4 falls with the last fall being 3 days ago.  She did strike her head during that fall but was not evaluated.  She is currently on a blood thinner.  She has been using a walker and wheelchair while at home. Her primary care provider is aware of the weakness in the left lower extremity and has placed a referral for neurology, but she is unable to see neurology until late July and she feels that she needs to be evaluated sooner.  Past Medical History:  Diagnosis Date   Arthritis    knees   Blood clotting disorder ([place])    on toe    Degenerative disc disease, lumbar    Diabetes mellitus without complication (HCC)    GERD (gastroesophageal reflux disease)    Headache    daily - AM (has not been able to have SPG blocks lately)   Hyperlipidemia    Hypertension    Neuropathy    feet   Vertigo    3-4x/yr      Physical Exam   Triage Vital Signs: ED Triage Vitals  Enc Vitals Group     BP 05/27/22 1207 125/78     Pulse Rate 05/27/22 1207 81     Resp 05/27/22 1207 18     Temp 05/27/22 1207 98 F (36.7 C)     Temp src --       SpO2 05/27/22 1207 100 %     Weight --      Height --      Head Circumference --      Peak Flow --      Pain Score 05/27/22 1206 9     Pain Loc --      Pain Edu? --      Excl. in [place]? --     Most recent vital signs: Vitals:   05/27/22 1207  BP: 125/78  Pulse: 81  Resp: 18  Temp: 98 F (36.7 C)  SpO2: 100%    General: Awake, no distress.  CV:  Good peripheral perfusion.  Resp:  Normal effort.  Abd:  No distention.  Other:  Unable to perform active flexion of the left knee or hip.  Pain in the left hip with passive flexion.  Pain in the left lumbar area with movement.  No focal tenderness of the left knee with palpation.  No swelling of the left knee or left ankle.   ED Results / Procedures / Treatments   Labs (all labs ordered are listed, but only abnormal results are  displayed) Labs Reviewed  COMPREHENSIVE METABOLIC PANEL - Abnormal; Notable for the following components:      Result Value   Sodium 134 (*)    Chloride 87 (*)    Glucose, Bld 240 (*)    Total Bilirubin 1.5 (*)    All other components within normal limits  CBC WITH DIFFERENTIAL/PLATELET - Abnormal; Notable for the following components:   WBC 10.6 (*)    Abs Immature Granulocytes 0.14 (*)    All other components within normal limits  TSH - Abnormal; Notable for the following components:   TSH 115.542 (*)    All other components within normal limits  CK  MAGNESIUM  URINALYSIS, ROUTINE W REFLEX MICROSCOPIC  T4, FREE     EKG     RADIOLOGY  CT head normal Image of the lumbar spine is negative for acute concerns.  I have independently reviewed and interpreted imaging as well as reviewed report from radiology.  PROCEDURES:  Critical Care performed: No  Procedures   MEDICATIONS ORDERED IN ED:  Medications - No data to display   IMPRESSION / MDM / [place] / ED COURSE   I reviewed the triage vital signs and the nursing notes.                               Differential diagnosis includes, but is not limited to: Muscle wasting, CVA, multiple sclerosis, ALS, knee sprain.  Patient's presentation is most consistent with acute presentation with potential threat to life or bodily function.  56 year old female with multiple comorbidities presents to the emergency department for evaluation of weakness in the left lower extremity.  See HPI for further details.  On exam, she has no weakness in the upper extremities.  She has no facial droop, slurred speech, difficulty swallowing or other neurological abnormalities.  She is able to demonstrate full range of motion of the right lower extremity.  Left lower extremity is dependent and she is unable to perform flexion of the knee/hip while lying down.   Plan will be to get a head CT initially as well as image of the lumbar spine.  She had negative imaging of the left knee about a week ago and currently has no bony tenderness.  Clinical Course as of 05/27/22 1539  Wed May 27, 2022  1508 CT head negative for acute concerns. Image of the lumbar spine also negative. Labs thus far are overall reassuring. Awaiting TSH and urinalysis.  [CT]  C[place]rane relinquished to Dr. Jari Pigg who will follow up on MRIs and remainder of labs. [CT]    Clinical Course User Index [CT] Avrey Hyser B, FNP     FINAL CLINICAL IMPRESSION(S) / ED DIAGNOSES   Final diagnoses:  Weakness of left lower extremity     Rx / DC Orders   ED Discharge Orders     None        Note:  This document was prepared using Dragon voice recognition software and may include unintentional dictation errors.   Victorino Dike, FNP 05/27/22 1539    Vanessa [place], MD 05/28/22 1355

## 2022-05-27 NOTE — Telephone Encounter (Signed)
Patient called back- patient notified of provider recommendation and she is going to go to ED today for evaluation.

## 2022-05-27 NOTE — ED Provider Notes (Signed)
5:48 PM Assumed care for off going team.   Blood pressure 125/78, pulse 81, temperature 98 F (36.7 C), resp. rate 18, SpO2 100 %.  See their HPI for full report but in brief pending MRI    I personally evaluated patient who states that she has been off of her thyroid medicine for the past 2 weeks.  She states that it is like her knee is giving out on her but she extremely weak in the leg with inability to flex and extend the ankle.  She has been needing to use a walker and wheelchair but is had recurrent falls.  When I try to lift up the leg she denies any hip pain it is more just like she feels weak and she cannot lift it.  Patient does report that she is on gabapentin and due for her dose.  She is followed by pain management.  I reviewed the records and patient had a thyroidectomy back in April 2022.  I reviewed the records and she supposed be on 200 of levothyroxine daily.  DVT negative Xray negative hip and leg negative  Patient has no evidence of myxedema coma on examination.  I reviewed and it appears that she has refills of her medication with her optem mail service and she is going to call them and try to figure out why she has not gotten.  It would be odd that the thyroid would be causing her 1 leg not to be raised.  I suspect that patient will need endocrine follow-up as well as neurology follow-up.  Patient be handed off pending MRIs, urine.  If MRI is negative can do ambulatory trial and see if patient would need admission due to sudden onset of left leg weakness for neurology consultation as well as restarting her oral endocrine medications.     Vanessa [place], MD 05/27/22 Karl [place]

## 2022-05-27 NOTE — Patient Instructions (Signed)

## 2022-05-28 ENCOUNTER — Telehealth: Payer: Self-pay

## 2022-05-28 ENCOUNTER — Encounter: Payer: Self-pay | Admitting: Family Medicine

## 2022-05-28 MED ORDER — LEVOTHYROXINE SODIUM 200 MCG PO TABS: 200.0000 ug | ORAL_TABLET | Freq: Every day | ORAL | 0 refills | Status: DC

## 2022-05-28 NOTE — Discharge Instructions (Addendum)
Follow-up with Neurology as discussed. Return to the ED as needed.

## 2022-05-28 NOTE — Telephone Encounter (Signed)
Post procedure phone call.  LM 

## 2022-06-04 ENCOUNTER — Encounter: Payer: Self-pay | Admitting: Family Medicine

## 2022-06-05 NOTE — Progress Notes (Unsigned)
MyChart Video Visit    Virtual Visit via Video Note   This visit type was conducted due to national recommendations for restrictions regarding the COVID-19 Pandemic (e.g. social distancing) in an effort to limit this patient's exposure and mitigate transmission in our community. This patient is at least at moderate risk for complications without adequate follow up. This format is felt to be most appropriate for this patient at this time. Physical exam was limited by quality of the video and audio technology used for the visit.   Patient location: *** Provider location: ***  I discussed the limitations of evaluation and management by telemedicine and the availability of in person appointments. The patient expressed understanding and agreed to proceed.  Patient: Gabriella Marsh   DOB: [birth year]   56 y.o. Female  MRN: [id number] Visit Date: 06/09/2022  Today's healthcare provider: [name], FNP   No chief complaint on file.  Subjective    HPI  ***   Medications: Outpatient Medications Prior to Visit  Medication Sig   atorvastatin (LIPITOR) 20 MG tablet Take 1 tablet (20 mg total) by mouth daily.   clopidogrel (PLAVIX) 75 MG tablet TAKE 1 TABLET(75 MG) BY MOUTH DAILY   cyclobenzaprine (FLEXERIL) 5 MG tablet Take 1 tablet (5 mg total) by mouth 3 (three) times daily as needed for muscle spasms.   Dulaglutide (TRULICITY) 1.49 FW/2.6VZ SOPN Inject 0.75 mg into the skin once a week. (Patient not taking: Reported on 05/27/2022)   Dulaglutide (TRULICITY) 1.5 CH/8.8FO SOPN Inject 1.5 mg into the skin once a week.   fluticasone (FLONASE) 50 MCG/ACT nasal spray Place 2 sprays into both nostrils daily.   furosemide (LASIX) 20 MG tablet TAKE 2 TABLETS(40 MG) BY MOUTH DAILY AS NEEDED FOR FLUID RETENTION OR SWELLING   gabapentin (NEURONTIN) 100 MG capsule TAKE 2 CAPSULES THREE TIMES A DAY ALONG WITH 600 MG THREE TIMES A DAY FOR A TOTAL DAILY DOSE OF 2400 MG DAILY   gabapentin  (NEURONTIN) 600 MG tablet Take 1 tablet (600 mg total) by mouth 3 (three) times daily.   glipiZIDE (GLUCOTROL) 10 MG tablet Take 1 tablet (10 mg total) by mouth 2 (two) times daily before a meal.   insulin glargine (LANTUS) 100 UNIT/ML Solostar Pen Inject 20 Units into the skin at bedtime. Titrate to a fasting blood sugar of 130-180.   Insulin Pen Needle 31G X 5 MM MISC 1 each by Does not apply route daily.   levothyroxine (SYNTHROID) 200 MCG tablet Take 1 tablet (200 mcg total) by mouth daily.   levothyroxine (SYNTHROID) 200 MCG tablet Take 1 tablet (200 mcg total) by mouth daily for 10 days.   lisinopril (ZESTRIL) 5 MG tablet TAKE 1 TABLET(5 MG) BY MOUTH DAILY   Melatonin 10 MG TABS Take 10 mg by mouth at bedtime.   metFORMIN (GLUCOPHAGE) 500 MG tablet Take 2 tablets (1,000 mg total) by mouth 2 (two) times daily with a meal.   nortriptyline (PAMELOR) 25 MG capsule TAKE 2 CAPSULES BY MOUTH AT BEDTIME   omeprazole (PRILOSEC) 20 MG capsule Take 1 capsule (20 mg total) by mouth daily.   oxyCODONE-acetaminophen (PERCOCET) 5-325 MG tablet Take 1-2 tablets by mouth 2 (two) times daily as needed for severe pain. Must last 30 days.   oxyCODONE-acetaminophen (PERCOCET) 5-325 MG tablet Take 1-2 tablets by mouth 2 (two) times daily as needed for severe pain. Must last 30 days.   [START ON 06/26/2022] oxyCODONE-acetaminophen (PERCOCET) 5-325 MG tablet Take 1-2 tablets  by mouth 2 (two) times daily as needed for severe pain. Must last 30 days.   traZODone (DESYREL) 50 MG tablet Take 2 tablets (100 mg total) by mouth at bedtime.   triamterene-hydrochlorothiazide (MAXZIDE-25) 37.5-25 MG tablet TAKE 1 TABLET BY MOUTH  DAILY   No facility-administered medications prior to visit.    Review of Systems  {Labs  Heme  Chem  Endocrine  Serology  Results Review (optional):23779}   Objective    There were no vitals taken for this visit.  {Show previous vital signs (optional):23777}   Physical Exam      Assessment & Plan     ***  No follow-ups on file.     I discussed the assessment and treatment plan with the patient. The patient was provided an opportunity to ask questions and all were answered. The patient agreed with the plan and demonstrated an understanding of the instructions.   The patient was advised to call back or seek an in-person evaluation if the symptoms worsen or if the condition fails to improve as anticipated.  I provided *** minutes of non-face-to-face time during this encounter.  {provider attestation***:1}  [name], [place] [phone] (phone) [phone] (fax)  [place]

## 2022-06-09 ENCOUNTER — Telehealth (INDEPENDENT_AMBULATORY_CARE_PROVIDER_SITE_OTHER): Payer: BLUE CROSS/BLUE SHIELD | Admitting: Family Medicine

## 2022-06-09 ENCOUNTER — Other Ambulatory Visit: Payer: Self-pay | Admitting: Family Medicine

## 2022-06-09 ENCOUNTER — Encounter: Payer: Self-pay | Admitting: Family Medicine

## 2022-06-09 ENCOUNTER — Telehealth: Payer: Self-pay

## 2022-06-09 DIAGNOSIS — E1140 Type 2 diabetes mellitus with diabetic neuropathy, unspecified: Secondary | ICD-10-CM

## 2022-06-09 DIAGNOSIS — W19XXXD Unspecified fall, subsequent encounter: Secondary | ICD-10-CM

## 2022-06-09 DIAGNOSIS — Z7409 Other reduced mobility: Secondary | ICD-10-CM | POA: Diagnosis not present

## 2022-06-09 DIAGNOSIS — R42 Dizziness and giddiness: Secondary | ICD-10-CM | POA: Diagnosis not present

## 2022-06-09 DIAGNOSIS — Z79891 Long term (current) use of opiate analgesic: Secondary | ICD-10-CM | POA: Insufficient documentation

## 2022-06-09 NOTE — Assessment & Plan Note (Signed)
Chronic, worsening Continues to use walker at home Use of w/c out Does not have permanent ramp at home for transportation Cannot get out of chair/off couch without assistance

## 2022-06-09 NOTE — Assessment & Plan Note (Signed)
Chronic, worsening No longer can get OOB or out of chair without assistance Has had to call non-emergent EMS line 2x in the last 2 weeks to get up as her family could not assist

## 2022-06-09 NOTE — Telephone Encounter (Signed)
Copied from [place] [id number]. Topic: General - Other >> Jun 09, 2022  2:06 PM Burman Freestone wrote: Reason for CRM: Pt is requesting to go to [facility] Physical therapy on [address]. She will have a huge copay for home health. I will close the referral for home health

## 2022-06-09 NOTE — Assessment & Plan Note (Signed)
Chronic, uncontrolled Provided with number for endocrinology to call for an appt Hx of neuropathy with hx of DM ulcerations and amputation

## 2022-06-09 NOTE — Assessment & Plan Note (Signed)
Acute on chronic, has not been able to establish with PT or neurology d/t transportation limitations  Referral placed for [facility] assistance with PT

## 2022-06-13 ENCOUNTER — Other Ambulatory Visit: Payer: Self-pay | Admitting: Family Medicine

## 2022-06-13 DIAGNOSIS — E1165 Type 2 diabetes mellitus with hyperglycemia: Secondary | ICD-10-CM

## 2022-06-17 ENCOUNTER — Other Ambulatory Visit: Payer: Self-pay | Admitting: Family Medicine

## 2022-06-17 DIAGNOSIS — E1140 Type 2 diabetes mellitus with diabetic neuropathy, unspecified: Secondary | ICD-10-CM

## 2022-06-18 ENCOUNTER — Encounter: Payer: Self-pay | Admitting: Family Medicine

## 2022-06-18 NOTE — Telephone Encounter (Signed)
Requested medication (s) are due for refill today:   Yes  Requested medication (s) are on the active medication list:   Yes  Future visit scheduled:   Yes   Last ordered: 03/23/2022 #270, 1 refill  Returned because a 1 yr supply is being requested    Requested Prescriptions  Pending Prescriptions Disp Refills   gabapentin (NEURONTIN) 600 MG tablet [Pharmacy Med Name: Gabapentin 600 MG Oral Tablet] 270 tablet 3    Sig: TAKE 1 TABLET BY MOUTH 3 TIMES  DAILY     Neurology: Anticonvulsants - gabapentin Passed - 06/17/2022 10:53 PM      Passed - Cr in normal range and within 360 days    Creatinine, Ser  Date Value Ref Range Status  05/27/2022 0.84 0.44 - 1.00 mg/dL Final   Creatinine,U  Date Value Ref Range Status  06/05/2015 194.5 mg/dL Final         Passed - Completed PHQ-2 or PHQ-9 in the last 360 days      Passed - Valid encounter within last 12 months    Recent Outpatient Visits           1 week ago Fall, subsequent encounter   [facility] Tally Joe T, FNP   1 month ago Type 2 diabetes mellitus with diabetic neuropathy, without long-term current use of insulin [facility])   [facility] Tally Joe T, FNP   2 months ago Acute non-recurrent frontal sinusitis   [facility] Thedore Mins, [place], PA-C   4 months ago Type 2 diabetes mellitus with microalbuminuria, without long-term current use of insulin [facility])   [facility] Tally Joe T, FNP   5 months ago Left acute otitis media   [facility] [name], FNP       Future Appointments             In 1 month [name], [place], [place]

## 2022-06-21 ENCOUNTER — Other Ambulatory Visit: Payer: Self-pay | Admitting: Family Medicine

## 2022-06-22 ENCOUNTER — Encounter: Payer: Self-pay | Admitting: Student in an Organized Health Care Education/Training Program

## 2022-06-22 ENCOUNTER — Encounter: Payer: Self-pay | Admitting: Family Medicine

## 2022-07-05 ENCOUNTER — Other Ambulatory Visit: Payer: Self-pay | Admitting: Family Medicine

## 2022-07-05 DIAGNOSIS — K219 Gastro-esophageal reflux disease without esophagitis: Secondary | ICD-10-CM

## 2022-07-16 ENCOUNTER — Ambulatory Visit
Payer: BLUE CROSS/BLUE SHIELD | Attending: Student in an Organized Health Care Education/Training Program | Admitting: Student in an Organized Health Care Education/Training Program

## 2022-07-16 ENCOUNTER — Encounter: Payer: Self-pay | Admitting: Student in an Organized Health Care Education/Training Program

## 2022-07-16 VITALS — BP 98/69 | HR 97 | Temp 98.0°F | Ht 70.0 in | Wt 223.0 lb

## 2022-07-16 DIAGNOSIS — E1142 Type 2 diabetes mellitus with diabetic polyneuropathy: Secondary | ICD-10-CM

## 2022-07-16 DIAGNOSIS — M25512 Pain in left shoulder: Secondary | ICD-10-CM | POA: Insufficient documentation

## 2022-07-16 DIAGNOSIS — Z8781 Personal history of (healed) traumatic fracture: Secondary | ICD-10-CM

## 2022-07-16 DIAGNOSIS — Z79891 Long term (current) use of opiate analgesic: Secondary | ICD-10-CM

## 2022-07-16 DIAGNOSIS — M545 Low back pain, unspecified: Secondary | ICD-10-CM

## 2022-07-16 DIAGNOSIS — M25561 Pain in right knee: Secondary | ICD-10-CM | POA: Insufficient documentation

## 2022-07-16 DIAGNOSIS — M79641 Pain in right hand: Secondary | ICD-10-CM

## 2022-07-16 DIAGNOSIS — G8929 Other chronic pain: Secondary | ICD-10-CM

## 2022-07-16 DIAGNOSIS — M79671 Pain in right foot: Secondary | ICD-10-CM | POA: Insufficient documentation

## 2022-07-16 DIAGNOSIS — G894 Chronic pain syndrome: Secondary | ICD-10-CM

## 2022-07-16 DIAGNOSIS — M79672 Pain in left foot: Secondary | ICD-10-CM | POA: Diagnosis present

## 2022-07-16 DIAGNOSIS — Z79899 Other long term (current) drug therapy: Secondary | ICD-10-CM | POA: Diagnosis present

## 2022-07-16 DIAGNOSIS — M170 Bilateral primary osteoarthritis of knee: Secondary | ICD-10-CM | POA: Diagnosis not present

## 2022-07-16 DIAGNOSIS — M79642 Pain in left hand: Secondary | ICD-10-CM | POA: Diagnosis present

## 2022-07-16 DIAGNOSIS — M4310 Spondylolisthesis, site unspecified: Secondary | ICD-10-CM | POA: Diagnosis present

## 2022-07-16 DIAGNOSIS — M4317 Spondylolisthesis, lumbosacral region: Secondary | ICD-10-CM

## 2022-07-16 DIAGNOSIS — M792 Neuralgia and neuritis, unspecified: Secondary | ICD-10-CM | POA: Insufficient documentation

## 2022-07-16 DIAGNOSIS — G608 Other hereditary and idiopathic neuropathies: Secondary | ICD-10-CM | POA: Diagnosis not present

## 2022-07-16 DIAGNOSIS — M25562 Pain in left knee: Secondary | ICD-10-CM | POA: Diagnosis present

## 2022-07-16 MED ORDER — OXYCODONE-ACETAMINOPHEN 5-325 MG PO TABS: 1.0000 | ORAL_TABLET | Freq: Three times a day (TID) | ORAL | 0 refills | Status: DC | PRN

## 2022-07-16 NOTE — Progress Notes (Signed)
Nursing Pain Medication Assessment:  Safety precautions to be maintained throughout the outpatient stay will include: orient to surroundings, keep bed in low position, maintain call bell within reach at all times, provide assistance with transfer out of bed and ambulation.  Medication Inspection Compliance: Pill count conducted under aseptic conditions, in front of the patient. Neither the pills nor the bottle was removed from the patient's sight at any time. Once count was completed pills were immediately returned to the patient in their original bottle.  Medication: Oxycodone/APAP Pill/Patch Count:  4 of 75 pills remain Pill/Patch Appearance: Markings consistent with prescribed medication Bottle Appearance: Standard pharmacy container. Clearly labeled. Filled Date: 06 / 03 / 2023 Last Medication intake:   06-18-2022

## 2022-07-16 NOTE — Progress Notes (Signed)
PROVIDER NOTE: Information contained herein reflects review and annotations entered in association with encounter. Interpretation of such information and data should be left to medically-trained personnel. Information provided to patient can be located elsewhere in the medical record under "Patient Instructions". Document created using STT-dictation technology, any transcriptional errors that may result from process are unintentional.    Patient: Gabriella Marsh  Service Category: E/M  Provider: Gillis Santa, MD  DOB: [birth year]  DOS: 07/16/2022  Referring Provider: [name], FNP  MRN: [id number]  Specialty: Interventional Pain Management  PCP: [name], FNP  Type: Established Patient  Setting: Ambulatory outpatient    Location: Office  Delivery: Face-to-face     HPI  [name], a 56 y.o. year old female, is here today because of her Diabetic peripheral neuropathy ([place]) [E11.42]. [name] primary complain today is Foot Pain, Leg Pain (bilateral), and Back Pain (low) Last encounter: My last encounter with her was on 05/27/2022. Pertinent problems: Ms. Canny has Diabetic peripheral neuropathy ([place]); RLS (restless legs syndrome); Chronic low back pain (2ry area of Pain) (Bilateral) (L>R) w/o sciatica; DDD (degenerative disc disease), lumbosacral; Disorder of peripheral nervous system; Chronic pain syndrome; Pharmacologic therapy; Disorder of skeletal system; Problems influencing health status; Chronic hand pain (1ry area of Pain) (Bilateral) (L>R); Chronic feet pain (3ry area of Pain) (Bilateral) (L>R); Chronic shoulder pain (4th area of Pain) (Left); Chronic knee pain (5th area of Pain) (Bilateral) (L>R); Lumbar facet syndrome (Bilateral); Chronic sensorimotor polyneuropathy with axonal and demyelinating features; Osteoarthritis of knees (Bilateral); Tricompartment osteoarthritis of knees (Bilateral);  Grade 1 Retrolisthesis (81mm) of L5 over S1; Chronic peripheral  neuropathic pain; and Type 2 diabetes mellitus with peripheral neuropathy (HCC) on their pertinent problem list. Pain Assessment: Severity of Chronic pain is reported as a 6 /10. Location: Foot Right, Left/radiates from back to legs. Onset: More than a month ago. Quality: Discomfort, Constant, Nagging, Burning, Numbness. Timing: Constant. Modifying factor(s): warm baths. Vitals:  height is $RemoveB'5\' 10"'NHxbVLML$  (1.778 m) and weight is 223 lb (101.2 kg). Her temperature is 98 F (36.7 C). Her blood pressure is 98/69 and her pulse is 97. Her oxygen saturation is 99%.   Reason for encounter: medication management.   Patient presents today for medication management.  Unfortunately, she sustained a right distal femoral shaft fracture and underwent definitive fixation.  She has been at a skilled nursing facility recovering.  She states that she has been receiving pain medications there.  She states that her A1c at the time of her admission was 13.1.  She had a retrograde nail fixation of the right distal femoral shaft fracture on June 19, 2022.  I will increase her oxycodone to 5 mg every 8 hours as needed, quantity 80/month as she recovers from her acute injury in the context of having chronic pain syndrome with diabetic peripheral neuropathy.  I have emphasized the importance of getting her blood sugars better managed as this will help reduce her risk of falls in the future as she will have better proprioception.  She has a steady bowel regimen.  She continues multimodal analgesics as below.  Follow-up in 8 weeks to reassess her progress.  Pharmacotherapy Assessment  Analgesic: Percocet 5 mg TID as needed, quantity 75/month   Monitoring: [place] PMP: PDMP reviewed during this encounter.       Pharmacotherapy: No side-effects or adverse reactions reported. Compliance: No problems identified. Effectiveness: Clinically acceptable.  Dewayne Shorter, RN  07/16/2022  1:28 PM  Sign  when Signing Visit Nursing Pain Medication  Assessment:  Safety precautions to be maintained throughout the outpatient stay will include: orient to surroundings, keep bed in low position, maintain call bell within reach at all times, provide assistance with transfer out of bed and ambulation.  Medication Inspection Compliance: Pill count conducted under aseptic conditions, in front of the patient. Neither the pills nor the bottle was removed from the patient's sight at any time. Once count was completed pills were immediately returned to the patient in their original bottle.  Medication: Oxycodone/APAP Pill/Patch Count:  4 of 75 pills remain Pill/Patch Appearance: Markings consistent with prescribed medication Bottle Appearance: Standard pharmacy container. Clearly labeled. Filled Date: 06 / 03 / 2023 Last Medication intake:   06-18-2022  UDS:  Summary  Date Value Ref Range Status  11/12/2021 Note  Final    Comment:    ==================================================================== Compliance Drug Analysis, Ur ==================================================================== Test                             Result       Flag       Units  Drug Present and Declared for Prescription Verification   Oxycodone                      1136         EXPECTED   ng/mg creat   Oxymorphone                    1753         EXPECTED   ng/mg creat   Noroxycodone                   1215         EXPECTED   ng/mg creat   Noroxymorphone                 779          EXPECTED   ng/mg creat    Sources of oxycodone are scheduled prescription medications.    Oxymorphone, noroxycodone, and noroxymorphone are expected    metabolites of oxycodone. Oxymorphone is also available as a    scheduled prescription medication.    Gabapentin                     PRESENT      EXPECTED   Cyclobenzaprine                PRESENT      EXPECTED   Desmethylcyclobenzaprine       PRESENT      EXPECTED    Desmethylcyclobenzaprine is an expected metabolite of     cyclobenzaprine.    Nortriptyline                  PRESENT      EXPECTED    Nortriptyline may be administered as a prescription drug; it is also    an expected metabolite of amitriptyline.    Trazodone                      PRESENT      EXPECTED   1,3 chlorophenyl piperazine    PRESENT      EXPECTED    1,3-chlorophenyl piperazine is an expected metabolite of trazodone.    Acetaminophen  PRESENT      EXPECTED ==================================================================== Test                      Result    Flag   Units      Ref Range   Creatinine              165              mg/dL      >=20 ==================================================================== Declared Medications:  The flagging and interpretation on this report are based on the  following declared medications.  Unexpected results may arise from  inaccuracies in the declared medications.   **Note: The testing scope of this panel includes these medications:   Cyclobenzaprine (Flexeril)  Gabapentin (Neurontin)  Nortriptyline (Pamelor)  Oxycodone (Percocet)  Trazodone (Desyrel)   **Note: The testing scope of this panel does not include small to  moderate amounts of these reported medications:   Acetaminophen (Percocet)   **Note: The testing scope of this panel does not include the  following reported medications:   Atorvastatin  Clopidogrel  Dulaglutide (Trulicity)  Glipizide (Glucotrol)  Hydrochlorothiazide  Levothyroxine  Lisinopril (Zestril)  Melatonin  Metformin (Glucophage)  Omeprazole  Triamterene ==================================================================== For clinical consultation, please call [phone]. ====================================================================      ROS  Constitutional: Denies any fever or chills Gastrointestinal: No reported hemesis, hematochezia, vomiting, or acute GI distress Musculoskeletal:  Right thigh femur  fracture Neurological: No reported episodes of acute onset apraxia, aphasia, dysarthria, agnosia, amnesia, paralysis, loss of coordination, or loss of consciousness  Medication Review  Insulin Pen Needle, Melatonin, atorvastatin, clopidogrel, cyclobenzaprine, fluticasone, furosemide, gabapentin, insulin glargine, levothyroxine, lisinopril, metFORMIN, nortriptyline, omeprazole, oxyCODONE-acetaminophen, traZODone, and triamterene-hydrochlorothiazide  History Review  Allergy: [name] is allergic to celecoxib, pregabalin, buspar [buspirone], citalopram, and other. Drug: [name]  reports no history of drug use. Alcohol:  reports no history of alcohol use. Tobacco:  reports that she quit smoking about 10 years ago. Her smoking use included cigarettes. She has never used smokeless tobacco. Social: [name]  reports that she quit smoking about 10 years ago. Her smoking use included cigarettes. She has never used smokeless tobacco. She reports that she does not drink alcohol and does not use drugs. Medical:  has a past medical history of Arthritis, Blood clotting disorder ([place]), Degenerative disc disease, lumbar, Diabetes mellitus without complication ([place]), GERD (gastroesophageal reflux disease), Headache, Hyperlipidemia, Hypertension, Neuropathy, and Vertigo. Surgical: [name]  has a past surgical history that includes Cesarean section; Shoulder surgery (Left, 12/26/014); Abdominal hysterectomy; Tonsillectomy and adenoidectomy; Uterine fibroid surgery; Ovarian cyst surgery; Cholecystectomy; left arm frature (10/12/2020); Thyroidectomy (N/A, 03/28/2021); Parathyroidectomy (03/28/2021); Amputation toe (Right, 07/24/2021); Colonoscopy with propofol (N/A, 02/20/2022); and right femur surgery (Right). Family: family history includes Congestive Heart Failure in her father; Diabetes in her father; Healthy in her brother; Heart disease in her father; Hyperlipidemia in her father; Hypertension in her  father; Irritable bowel syndrome in her mother; Osteoporosis in her maternal grandmother and mother.  Laboratory Chemistry Profile   Renal Lab Results  Component Value Date   BUN 16 05/27/2022   CREATININE 0.84 05/27/2022   BCR 21 04/24/2022   GFR 111.18 06/05/2015   GFRAA 92 01/03/2021   GFRNONAA >60 05/27/2022    Hepatic Lab Results  Component Value Date   AST 24 05/27/2022   ALT 20 05/27/2022   ALBUMIN 4.0 05/27/2022   ALKPHOS 55 05/27/2022   LIPASE 47 05/04/2015    Electrolytes Lab  Results  Component Value Date   NA 134 (L) 05/27/2022   K 3.8 05/27/2022   CL 87 (L) 05/27/2022   CALCIUM 9.4 05/27/2022   MG 1.7 05/27/2022    Bone Lab Results  Component Value Date   25OHVITD1 14 (L) 11/12/2021   25OHVITD2 <1.0 11/12/2021   25OHVITD3 13 11/12/2021    Inflammation (CRP: Acute Phase) (ESR: Chronic Phase) Lab Results  Component Value Date   CRP 4 11/12/2021   ESRSEDRATE 39 11/12/2021   LATICACIDVEN 2.4 ([place]) 07/22/2021         Note: Above Lab results reviewed.  Recent Imaging Review  MR Brain W and Wo Contrast CLINICAL DATA:  Left lower extremity weakness  EXAM: MRI HEAD WITHOUT AND WITH CONTRAST  TECHNIQUE: Multiplanar, multiecho pulse sequences of the brain and surrounding structures were obtained without and with intravenous contrast.  CONTRAST:  28mL GADAVIST GADOBUTROL 1 MMOL/ML IV SOLN  COMPARISON:  02/12/2017  FINDINGS: Brain: No acute infarct, mass effect or extra-axial collection. No acute or chronic hemorrhage. There is multifocal hyperintense T2-weighted signal within the white matter. Parenchymal volume and CSF spaces are normal. The midline structures are normal. There is no abnormal contrast enhancement.  Vascular: Major flow voids are preserved.  Skull and upper cervical spine: Normal calvarium and skull base. Visualized upper cervical spine and soft tissues are normal.  Sinuses/Orbits:No paranasal sinus fluid levels or advanced  mucosal thickening. No mastoid or middle ear effusion. Normal orbits.  IMPRESSION: 1. No acute intracranial abnormality. 2. Multifocal hyperintense T2-weighted signal within the white matter, nonspecific and unchanged.  Electronically Signed   By: Ulyses Jarred M.D.   On: 05/27/2022 22:31 MR THORACIC SPINE WO CONTRAST CLINICAL DATA:  Mid back pain. Left lower extremity weakness.  EXAM: MRI THORACIC SPINE WITHOUT CONTRAST  TECHNIQUE: Multiplanar, multisequence MR imaging of the thoracic spine was performed. No intravenous contrast was administered.  COMPARISON:  Thoracic spine radiographs 04/02/2020  FINDINGS: Alignment: No significant listhesis is present. Mild leftward curvature is again noted in the upper thoracic spine.  Vertebrae: 2 cm hemangioma is present at T8. 11 mm hemangioma is noted inferiorly at T10. Hemangiomas are present at T12. Marrow signal and vertebral body heights are otherwise normal.  Cord:  Normal signal and morphology.  Paraspinal and other soft tissues: A small right pleural effusion is present. Minimal atelectasis is present. Mediastinum is unremarkable. Paraspinous musculature demonstrates mild atrophy.  Disc levels:  Shallow central disc protrusions are present at C5-6 and T6-7 without significant stenosis. Facet hypertrophy contributes to moderate right and mild left foraminal narrowing at T9-10. Foramina are otherwise patent.  IMPRESSION: 1. Moderate right and mild left foraminal stenosis at T9-10 secondary to facet hypertrophy. 2. Shallow central disc protrusions at C5-6 and T6-7 without significant stenosis. 3. 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 4. Small right pleural effusion.  Electronically Signed   By: San Morelle M.D.   On: 05/27/2022 21:20 MR LUMBAR SPINE WO CONTRAST CLINICAL DATA:  Low back pain. Cauda equina syndrome suspected.  EXAM: MRI LUMBAR SPINE WITHOUT CONTRAST  TECHNIQUE: Multiplanar,  multisequence MR imaging of the lumbar spine was performed. No intravenous contrast was administered.  COMPARISON:  Lumbar spine radiographs 05/27/2022 and 11/12/2021. MRI of the lumbar spine 06/06/2015  FINDINGS: Segmentation: 5 non rib-bearing lumbar type vertebral bodies are present. The lowest fully formed vertebral body is L5.  Alignment: Slight progression of retrolisthesis noted at L5-S1, now measuring 8 mm. No other significant listhesis is present. Straightening of the  normal lumbar lordosis is present.  Vertebrae: Remote wedge deformity of L1 is stable. Chronic fatty endplate marrow changes again noted at L5-S1. Marrow signal and vertebral body heights are otherwise normal.  Conus medullaris and cauda equina: Conus extends to the T12-L1 level. Conus and cauda equina appear normal.  Paraspinal and other soft tissues: Limited imaging the abdomen is unremarkable. There is no significant adenopathy. No solid organ lesions are present.  Disc levels:  L1-2: Negative.  L2-3: Negative.  L3-4: Mild facet hypertrophy is present. No significant disc protrusion or stenosis is present.  L4-5: A rightward disc protrusion is present. No significant focal stenosis is present. Mild facet hypertrophy has progressed slightly.  L5-S1: Uncovering of a central disc protrusion demonstrates some progression. Mild to moderate subarticular and foraminal stenosis has progressed, left greater than right.  IMPRESSION: 1. Progressive mild to moderate subarticular and foraminal stenosis at L5-S1, left greater than right. 2. Slight progression of retrolisthesis at L5-S1. 3. Rightward disc protrusion at L4-5 without significant stenosis. 4. Mild facet hypertrophy at L3-4 without significant disc protrusion or stenosis.  Electronically Signed   By: San Morelle M.D.   On: 05/27/2022 21:15 US Venous Img Lower Unilateral Left CLINICAL DATA:  Left lower extremity  swelling.  EXAM: LEFT LOWER EXTREMITY VENOUS DOPPLER ULTRASOUND  TECHNIQUE: Gray-scale sonography with graded compression, as well as color Doppler and duplex ultrasound were performed to evaluate the lower extremity deep venous systems from the level of the common femoral vein and including the common femoral, femoral, profunda femoral, popliteal and calf veins including the posterior tibial, peroneal and gastrocnemius veins when visible. The superficial great saphenous vein was also interrogated. Spectral Doppler was utilized to evaluate flow at rest and with distal augmentation maneuvers in the common femoral, femoral and popliteal veins.  COMPARISON:  None Available.  FINDINGS: Contralateral Common Femoral Vein: Respiratory phasicity is normal and symmetric with the symptomatic side. No evidence of thrombus. Normal compressibility.  Common Femoral Vein: No evidence of thrombus. Normal compressibility, respiratory phasicity and response to augmentation.  Saphenofemoral Junction: No evidence of thrombus. Normal compressibility and flow on color Doppler imaging.  Profunda Femoral Vein: No evidence of thrombus. Normal compressibility and flow on color Doppler imaging.  Femoral Vein: No evidence of thrombus. Normal compressibility, respiratory phasicity and response to augmentation.  Popliteal Vein: No evidence of thrombus. Normal compressibility, respiratory phasicity and response to augmentation.  Calf Veins: No evidence of thrombus. Normal compressibility and flow on color Doppler imaging.  Other Findings:  None.  IMPRESSION: Negative for deep venous thrombosis in left lower extremity.  Electronically Signed   By: Markus Daft M.D.   On: 05/27/2022 17:16 DG Pelvis 1-2 Views CLINICAL DATA:  Left leg pain, multiple falls  EXAM: PELVIS - 1-2 VIEW  COMPARISON:  None Available.  FINDINGS: There is no evidence of pelvic fracture or diastasis. No pelvic  bone lesions are seen.  IMPRESSION: Negative.  Electronically Signed   By: Lucrezia Europe M.D.   On: 05/27/2022 16:56 DG FEMUR MIN 2 VIEWS LEFT CLINICAL DATA:  Pain, multiple falls  EXAM: LEFT FEMUR 2 VIEWS  COMPARISON:  05/20/2022 and previous  FINDINGS: There is no evidence of fracture or other focal bone lesions. Early marginal spurs involving the lateral femoral condyle, medial tibial plateau, and patellar articular surface. No joint effusion. Soft tissues are unremarkable.  IMPRESSION: 1. No acute findings. 2. Early tricompartment knee DJD  Electronically Signed   By: Lucrezia Europe M.D.   On:  05/27/2022 16:56 DG Lumbar Spine 2-3 Views CLINICAL DATA:  [place].  Back pain.  EXAM: LUMBAR SPINE - 2-3 VIEW  COMPARISON:  11/12/2021  FINDINGS: Normal alignment of the lumbar vertebral bodies. There are mild stable compression deformities but no acute compression fracture. The facets are normally aligned. Stable moderate degenerative disc disease at L5-S1. The visualized bony pelvis is intact. The lower left ribs are intact. Stable age advanced vascular calcifications.  IMPRESSION: 1. Stable mild compression deformities. 2. No acute bony findings. 3. Stable moderate degenerative disc disease at L5-S1.  Electronically Signed   By: Marijo Sanes M.D.   On: 05/27/2022 14:23 CT Head Wo Contrast CLINICAL DATA:  Head trauma, focal neuro findings (Age 31-64y)  EXAM: CT HEAD WITHOUT CONTRAST  TECHNIQUE: Contiguous axial images were obtained from the base of the skull through the vertex without intravenous contrast.  RADIATION DOSE REDUCTION: This exam was performed according to the departmental dose-optimization program which includes automated exposure control, adjustment of the mA and/or kV according to patient size and/or use of iterative reconstruction technique.  COMPARISON:  CT 05/04/2015.  FINDINGS: Brain: No evidence of acute intracranial hemorrhage or  extra-axial collection.No evidence of mass lesion/concerning mass effect.The ventricles are normal in size.  Vascular: No hyperdense vessel or unexpected calcification.  Skull: Hyperostosis frontalis internus. Negative for skull fracture.  Sinuses/Orbits: Minimal ethmoid air cell mucosal thickening. Orbits are unremarkable.  Other: None.  IMPRESSION: No acute intracranial abnormality.  Electronically Signed   By: Maurine Simmering M.D.   On: 05/27/2022 14:07 Note: Reviewed        Physical Exam  General appearance: Well nourished, well developed, and well hydrated. In no apparent acute distress Mental status: Alert, oriented x 3 (person, place, & time)       Respiratory: No evidence of acute respiratory distress Eyes: PERLA Vitals: BP 98/69   Pulse 97   Temp 98 F (36.7 C)   Ht $R'5\' 10"'ek$  (1.778 m)   Wt 223 lb (101.2 kg)   SpO2 99%   BMI 32.00 kg/m  BMI: Estimated body mass index is 32 kg/m as calculated from the following:   Height as of this encounter: $RemoveBeforeD'5\' 10"'goGjoteLIUkire$  (1.778 m).   Weight as of this encounter: 223 lb (101.2 kg). Ideal: Ideal body weight: 68.5 kg (151 lb 0.2 oz) Adjusted ideal body weight: 81.6 kg (179 lb 12.9 oz)  Patient presents in wheelchair.  Right femur support device in place.  Assessment   Diagnosis Status  1. Diabetic peripheral neuropathy ([place])   2. Chronic peripheral neuropathic pain   3. Chronic sensorimotor polyneuropathy with axonal and demyelinating features   4. Chronic shoulder pain (4th area of Pain) (Left)   5. History of proximal humerus fracture (Left)   6. Type 2 diabetes mellitus with peripheral neuropathy (HCC)   7. Chronic feet pain (3ry area of Pain) (Bilateral) (L>R)   8. Chronic pain syndrome   9.  Grade 1 Retrolisthesis (40mm) of L5 over S1   10. Chronic hand pain (1ry area of Pain) (Bilateral) (L>R)   11. Chronic low back pain (2ry area of Pain) (Bilateral) (L>R) w/o sciatica   12. Chronic knee pain (5th area of Pain) (Bilateral)  (L>R)   13. Osteoarthritis of knees (Bilateral)   14. Tricompartment osteoarthritis of knees (Bilateral)   15. Pharmacologic therapy   16. Chronic use of opiate for therapeutic purpose   17. Encounter for medication management   18. Encounter for chronic pain management    Controlled  Controlled Controlled    Plan of Care    [name] has a current medication list which includes the following long-term medication(s): atorvastatin, fluticasone, furosemide, gabapentin, gabapentin, lantus solostar, levothyroxine, lisinopril, metformin, nortriptyline, omeprazole, trazodone, triamterene-hydrochlorothiazide, levothyroxine, and [START ON 09/02/2022] oxycodone-acetaminophen.  Pharmacotherapy (Medications Ordered): Meds ordered this encounter  Medications   oxyCODONE-acetaminophen (PERCOCET) 5-325 MG tablet    Sig: Take 1 tablet by mouth every 8 (eight) hours as needed for severe pain. Must last 30 days.    Dispense:  90 tablet    Refill:  0    DO NOT: delete (not duplicate); no partial-fill (will deny script to complete), no refill request (F/U required). DISPENSE: 1 day early if closed on fill date. WARN: No CNS-depressants within 8 hrs of med.   Orders:  No orders of the defined types were placed in this encounter.  Follow-up plan:   Return in about 3 months (around 10/16/2022) for Medication Management, in person.    Recent Visits Date Type Provider Dept  05/27/22 Procedure visit Gillis Santa, MD [facility]  04/21/22 Office Visit Gillis Santa, MD [facility]  Showing recent visits within past 90 days and meeting all other requirements Today's Visits Date Type Provider Dept  07/16/22 Office Visit Gillis Santa, MD [facility]  Showing today's visits and meeting all other requirements Future Appointments Date Type Provider Dept  10/06/22 Appointment Gillis Santa, MD [facility]  Showing future appointments within next 90  days and meeting all other requirements  I discussed the assessment and treatment plan with the patient. The patient was provided an opportunity to ask questions and all were answered. The patient agreed with the plan and demonstrated an understanding of the instructions.  Patient advised to call back or seek an in-person evaluation if the symptoms or condition worsens.  Duration of encounter: 11mnutes.  Total time on encounter, as per AMA guidelines included both the face-to-face and non-face-to-face time personally spent by the physician and/or other qualified health care professional(s) on the day of the encounter (includes time in activities that require the physician or other qualified health care professional and does not include time in activities normally performed by clinical staff). Physician's time may include the following activities when performed: preparing to see the patient (eg, review of tests, pre-charting review of records) obtaining and/or reviewing separately obtained history performing a medically appropriate examination and/or evaluation counseling and educating the patient/family/caregiver ordering medications, tests, or procedures referring and communicating with other health care professionals (when not separately reported) documenting clinical information in the electronic or other health record independently interpreting results (not separately reported) and communicating results to the patient/ family/caregiver care coordination (not separately reported)  Note by: BGillis Santa MD Date: 07/16/2022; Time: 2:39 PM

## 2022-07-17 ENCOUNTER — Telehealth: Payer: Self-pay | Admitting: Family Medicine

## 2022-07-17 ENCOUNTER — Other Ambulatory Visit: Payer: Self-pay

## 2022-07-17 MED ORDER — ATORVASTATIN CALCIUM 20 MG PO TABS: 20.0000 mg | ORAL_TABLET | Freq: Every day | ORAL | 0 refills | Status: DC

## 2022-07-17 NOTE — Telephone Encounter (Signed)
[facility] faxed refill request for the following medications:  atorvastatin (LIPITOR) 20 MG tablet   Please advise.

## 2022-07-17 NOTE — Telephone Encounter (Signed)
Refill sent in.  KP

## 2022-07-23 ENCOUNTER — Encounter: Payer: BLUE CROSS/BLUE SHIELD | Admitting: Family Medicine

## 2022-07-27 ENCOUNTER — Telehealth: Payer: Self-pay

## 2022-07-27 NOTE — Telephone Encounter (Signed)
Copied from [place] [phone]. Topic: General - Inquiry >> Jul 27, 2022 12:50 PM Marcellus Scott wrote: Reason for CRM: Pt requested I send a message to PCP and ask, if possible, for her upcoming appointment to be virtual 08/15. Pt state this is a follow up from rehab facility and is now on a wheelchair and cannot walk. If this cannot be done virtually pt is okay with date and time to come in person.   Please advise.

## 2022-07-28 NOTE — Telephone Encounter (Signed)
Spoke with patient, states she will come into the office on 8/15 as scheduled.

## 2022-07-30 ENCOUNTER — Encounter: Payer: Self-pay | Admitting: Family Medicine

## 2022-07-30 ENCOUNTER — Ambulatory Visit: Payer: BLUE CROSS/BLUE SHIELD | Admitting: Family Medicine

## 2022-07-30 NOTE — Progress Notes (Deleted)
      Established patient visit   Patient: Gabriella Marsh   DOB: 19-Dec-1966   56 y.o. Female  MRN: [id number] Visit Date: 08/04/2022  Today's healthcare provider: [name], FNP   No chief complaint on file.  Subjective    HPI  ***  Medications: Outpatient Medications Prior to Visit  Medication Sig   atorvastatin (LIPITOR) 20 MG tablet Take 1 tablet (20 mg total) by mouth daily.   clopidogrel (PLAVIX) 75 MG tablet TAKE 1 TABLET(75 MG) BY MOUTH DAILY   cyclobenzaprine (FLEXERIL) 5 MG tablet Take 1 tablet (5 mg total) by mouth 3 (three) times daily as needed for muscle spasms.   fluticasone (FLONASE) 50 MCG/ACT nasal spray Place 2 sprays into both nostrils daily.   furosemide (LASIX) 20 MG tablet TAKE 2 TABLETS(40 MG) BY MOUTH DAILY AS NEEDED FOR FLUID RETENTION OR SWELLING   gabapentin (NEURONTIN) 100 MG capsule TAKE 2 CAPSULES THREE TIMES A DAY ALONG WITH 600 MG THREE TIMES A DAY FOR A TOTAL DAILY DOSE OF 2400 MG DAILY   gabapentin (NEURONTIN) 600 MG tablet TAKE 1 TABLET BY MOUTH 3 TIMES  DAILY   Insulin Pen Needle 31G X 5 MM MISC 1 each by Does not apply route daily.   LANTUS SOLOSTAR 100 UNIT/ML Solostar Pen INJECT 20 UNITS UNDER THE SKIN AT BEDTIME, TITRATE TO FASTING SUGAR OF 130-180 AS DIRECTED (Patient taking differently: 49 units daily)   levothyroxine (SYNTHROID) 200 MCG tablet Take 1 tablet (200 mcg total) by mouth daily.   levothyroxine (SYNTHROID) 200 MCG tablet Take 1 tablet (200 mcg total) by mouth daily for 10 days.   lisinopril (ZESTRIL) 5 MG tablet TAKE 1 TABLET(5 MG) BY MOUTH DAILY   Melatonin 10 MG TABS Take 10 mg by mouth at bedtime.   metFORMIN (GLUCOPHAGE) 500 MG tablet Take 2 tablets (1,000 mg total) by mouth 2 (two) times daily with a meal.   nortriptyline (PAMELOR) 25 MG capsule TAKE 2 CAPSULES BY MOUTH AT BEDTIME   omeprazole (PRILOSEC) 20 MG capsule TAKE 1 CAPSULE(20 MG) BY MOUTH DAILY   [START ON 09/02/2022] oxyCODONE-acetaminophen (PERCOCET)  5-325 MG tablet Take 1 tablet by mouth every 8 (eight) hours as needed for severe pain. Must last 30 days.   traZODone (DESYREL) 50 MG tablet Take 2 tablets (100 mg total) by mouth at bedtime.   triamterene-hydrochlorothiazide (MAXZIDE-25) 37.5-25 MG tablet TAKE 1 TABLET BY MOUTH  DAILY   No facility-administered medications prior to visit.    Review of Systems  {Labs  Heme  Chem  Endocrine  Serology  Results Review (optional):23779}   Objective    There were no vitals taken for this visit. {Show previous vital signs (optional):23777}  Physical Exam  ***  No results found for any visits on 08/04/22.  Assessment & Plan     ***  No follow-ups on file.      {provider attestation***:1}   [name], [place] [phone] (phone) [phone] (fax)  [place]

## 2022-08-04 ENCOUNTER — Ambulatory Visit: Payer: BLUE CROSS/BLUE SHIELD | Admitting: Family Medicine

## 2022-08-05 ENCOUNTER — Telehealth: Payer: Self-pay

## 2022-08-05 ENCOUNTER — Ambulatory Visit: Payer: BLUE CROSS/BLUE SHIELD | Admitting: Urology

## 2022-08-05 NOTE — Telephone Encounter (Signed)
So the Qutenza is not covered, there is a plan exclusion. The auth from June was for the actual procedure code [phone], not for the medicine. We will not be able to get Qutenza approved, it is not covered.

## 2022-08-05 NOTE — Telephone Encounter (Signed)
I faxed in the form to [employer] for prior auth for Qutenza. They are saying it is not a covered medicine. I asked her why it was approved 3 months ago if it was not covered. She said patient needs to call member services. I called [place] and she is going to call them and let me know what they say.

## 2022-08-06 ENCOUNTER — Encounter: Payer: Self-pay | Admitting: Family Medicine

## 2022-08-16 ENCOUNTER — Encounter: Payer: Self-pay | Admitting: Family Medicine

## 2022-08-18 ENCOUNTER — Ambulatory Visit (INDEPENDENT_AMBULATORY_CARE_PROVIDER_SITE_OTHER): Payer: BLUE CROSS/BLUE SHIELD | Admitting: Urology

## 2022-08-18 ENCOUNTER — Encounter: Payer: Self-pay | Admitting: Urology

## 2022-08-18 ENCOUNTER — Ambulatory Visit: Payer: BLUE CROSS/BLUE SHIELD | Admitting: Physician Assistant

## 2022-08-18 VITALS — BP 113/76 | HR 106 | Ht 70.0 in | Wt 222.0 lb

## 2022-08-18 DIAGNOSIS — R339 Retention of urine, unspecified: Secondary | ICD-10-CM

## 2022-08-18 NOTE — Progress Notes (Signed)
08/18/2022 9:46 AM   Gabriella Marsh 05-15-1966 [id number]  Referring provider: Wyonia Hough [facility] [address] [place],  [place] [address]  Chief Complaint  Patient presents with   New Patient (Initial Visit)    Urinary retention    HPI: 56 year old female with a personal history of urinary retention who presents today to establish care.  She recently had a femur fracture in the end of June.  She underwent surgical repair with placement of a rod in her right femur on 06/19/2022 at [facility].  She has limited mobility of her left leg secondary to neuropathy and essentially has been wheelchair-bound, not weightbearing since.  She was briefly at [facility] but now back at home with her husband who helps her transfer.    She developed urinary retention requiring I&O x3 while at [facility].Marland Kitchen  Ultimately, a catheter was placed.  She had a voiding trial and failed.  She also treated for presumed UTIx 1.  Her primary concerns today are about toileting and mobilization.  She has not supposed start working with physical therapy for another 2 weeks.  She is able to transfer with assistance but is not able to ambulate.  Part of the reason she thinks that she was in retention, unable to urinate was secondary to psychological issues voiding in a bedpan.  Currently, she is not able to get to her potty chair.  She denies a personal history of urinary retention.  She does not know if she is on Flomax.  She denies any baseline urinary symptoms other than for frequency after taking Lasix.  She also has a personal history of poorly controlled diabetes.  Her last hemoglobin A1c was 11 2 months ago.  No issues with constipation.   PMH: Past Medical History:  Diagnosis Date   Arthritis    knees   Blood clotting disorder (English)    on toe    Degenerative disc disease, lumbar    Diabetes mellitus without complication (HCC)    GERD (gastroesophageal reflux disease)    Headache     daily - AM (has not been able to have SPG blocks lately)   Hyperlipidemia    Hypertension    Neuropathy    feet   Vertigo    3-4x/yr    Surgical History: Past Surgical History:  Procedure Laterality Date   ABDOMINAL HYSTERECTOMY     But still has cervix   AMPUTATION TOE Right 07/24/2021   Procedure: AMPUTATION TOE;  Surgeon: Sharlotte Alamo, DPM;  Location: [facility] ORS;  Service: Podiatry;  Laterality: Right;   CESAREAN SECTION     CHOLECYSTECTOMY     COLONOSCOPY WITH PROPOFOL N/A 02/20/2022   Procedure: COLONOSCOPY WITH PROPOFOL;  Surgeon: [name], MD;  Location: [facility] ENDOSCOPY;  Service: Gastroenterology;  Laterality: N/A;   left arm frature  10/12/2020   OVARIAN CYST SURGERY     PARATHYROIDECTOMY  03/28/2021   Procedure: PARATHYROIDECTOMY AUTOTRANSPLANT;  Surgeon: Fredirick Maudlin, MD;  Location: [facility] ORS;  Service: General;;   right femur surgery Right    SHOULDER SURGERY Left 12/26/014   Dr. Little Ishikawa, [place] N/A 03/28/2021   Procedure: THYROIDECTOMY, total;  Surgeon: Fredirick Maudlin, MD;  Location: [facility] ORS;  Service: General;  Laterality: N/A;  Provider requesting 3 hours /180 minutes for procedure   TONSILLECTOMY AND ADENOIDECTOMY     UTERINE FIBROID SURGERY      Home Medications:  Allergies as of 08/18/2022  Reactions   Celecoxib Shortness Of Breath   Pregabalin Shortness Of Breath   Buspar [buspirone] Other (See Comments)   Tachycardia   Citalopram Cough   Other Cough   [place] pear trees        Medication List        Accurate as of August 18, 2022  9:46 AM. If you have any questions, ask your nurse or doctor.          atorvastatin 20 MG tablet Commonly known as: LIPITOR Take 1 tablet (20 mg total) by mouth daily.   clopidogrel 75 MG tablet Commonly known as: PLAVIX TAKE 1 TABLET(75 MG) BY MOUTH DAILY   cyclobenzaprine 5 MG tablet Commonly known as: FLEXERIL Take 1 tablet (5 mg total) by mouth 3 (three) times daily as  needed for muscle spasms.   fluticasone 50 MCG/ACT nasal spray Commonly known as: FLONASE Place 2 sprays into both nostrils daily.   furosemide 20 MG tablet Commonly known as: LASIX TAKE 2 TABLETS(40 MG) BY MOUTH DAILY AS NEEDED FOR FLUID RETENTION OR SWELLING   gabapentin 100 MG capsule Commonly known as: NEURONTIN TAKE 2 CAPSULES THREE TIMES A DAY ALONG WITH 600 MG THREE TIMES A DAY FOR A TOTAL DAILY DOSE OF 2400 MG DAILY   gabapentin 600 MG tablet Commonly known as: NEURONTIN TAKE 1 TABLET BY MOUTH 3 TIMES  DAILY   Insulin Pen Needle 31G X 5 MM Misc 1 each by Does not apply route daily.   Lantus SoloStar 100 UNIT/ML Solostar Pen Generic drug: insulin glargine INJECT 20 UNITS UNDER THE SKIN AT BEDTIME, TITRATE TO FASTING SUGAR OF 130-180 AS DIRECTED What changed: See the new instructions.   levothyroxine 200 MCG tablet Commonly known as: SYNTHROID Take 1 tablet (200 mcg total) by mouth daily.   levothyroxine 200 MCG tablet Commonly known as: Synthroid Take 1 tablet (200 mcg total) by mouth daily for 10 days.   lisinopril 5 MG tablet Commonly known as: ZESTRIL TAKE 1 TABLET(5 MG) BY MOUTH DAILY   Melatonin 10 MG Tabs Take 10 mg by mouth at bedtime.   metFORMIN 500 MG tablet Commonly known as: GLUCOPHAGE Take 2 tablets (1,000 mg total) by mouth 2 (two) times daily with a meal.   nortriptyline 25 MG capsule Commonly known as: PAMELOR TAKE 2 CAPSULES BY MOUTH AT BEDTIME   omeprazole 20 MG capsule Commonly known as: PRILOSEC TAKE 1 CAPSULE(20 MG) BY MOUTH DAILY   oxyCODONE-acetaminophen 5-325 MG tablet Commonly known as: Percocet Take 1 tablet by mouth every 8 (eight) hours as needed for severe pain. Must last 30 days. Start taking on: September 02, 2022   traZODone 50 MG tablet Commonly known as: DESYREL Take 2 tablets (100 mg total) by mouth at bedtime.   triamterene-hydrochlorothiazide 37.5-25 MG tablet Commonly known as: MAXZIDE-25 TAKE 1 TABLET BY  MOUTH  DAILY        Allergies:  Allergies  Allergen Reactions   Celecoxib Shortness Of Breath   Pregabalin Shortness Of Breath   Buspar [Buspirone] Other (See Comments)    Tachycardia   Citalopram Cough   Other Cough    [place] pear trees    Family History: Family History  Problem Relation Age of Onset   Diabetes Father    Heart disease Father    Hypertension Father    Hyperlipidemia Father    Congestive Heart Failure Father    Healthy Brother    Osteoporosis Mother    Irritable bowel syndrome Mother  Osteoporosis Maternal Grandmother     Social History:  reports that she quit smoking about 10 years ago. Her smoking use included cigarettes. She has never used smokeless tobacco. She reports that she does not drink alcohol and does not use drugs.   Physical Exam: BP 113/76   Pulse (!) 106   Ht '5\' 10"'$  (1.778 m)   Wt 222 lb (100.7 kg)   BMI 31.85 kg/m   Constitutional:  Alert and oriented, No acute distress.  Wheelchair-bound.  Accompanied by husband today. HEENT: [place] AT, moist mucus membranes.  Trachea midline, no masses. Cardiovascular: No clubbing, cyanosis, or edema. Respiratory: Normal respiratory effort, no increased work of breathing. GI: Abdomen is soft, nontender, nondistended, no abdominal masses GU: Foley catheter bag in place draining clear yellow urine Skin: No rashes, bruises or suspicious lesions. Psychiatric: Normal mood and affect.  Laboratory Data: Lab Results  Component Value Date   WBC 10.6 (H) 05/27/2022   HGB 14.0 05/27/2022   HCT 42.3 05/27/2022   MCV 83.4 05/27/2022   PLT 361 05/27/2022    Lab Results  Component Value Date   CREATININE 0.84 05/27/2022    Lab Results  Component Value Date   HGBA1C 13.1 (H) 04/24/2022     Assessment & Plan:    1. Urinary retention -Urinary retention likely multifactorial including psychological, immobility, medical disease, possible component of bladder neuropathy and polypharmacy with  narcotics, etc.  -We discussed various options today.  Given her limited mobility, concern for toileting and transferring now that she is at home, we discussed Foley catheter exchange given that has been 30 days since her last exchange with plans to attempt voiding trial once she has regained at least some of her mobility and improved transfer ability  -Alternatively, we discussed voiding trial today and other bladder management strategies including incontinence devices or strategies for facilitating toileting  -She does not feel like she be able to self cath due to texture issues and upper extremity neuropathy  -Altered, she elected Foley catheter exchange today which was performed by our office staff.  She will return next month for consideration of voiding trial at that period time.  She understands the increased risk of infection with a chronic indwelling Foley.  All questions were answered.   F/u 1 mo possible VT  Hollice Espy, MD  [facility] [address] [place], [place] [address] [phone]  I spent 47 total minutes on the day of the encounter including pre-visit review of the medical record, face-to-face time with the patient, and post visit ordering of labs/imaging/tests.  50% of time was spent chart reviewing patient's extensive history and remainder time face-to-face with the patient discussing alternatives risk and benefits.

## 2022-09-08 ENCOUNTER — Other Ambulatory Visit: Payer: Self-pay | Admitting: Family Medicine

## 2022-09-08 DIAGNOSIS — G2581 Restless legs syndrome: Secondary | ICD-10-CM

## 2022-09-09 NOTE — Progress Notes (Unsigned)
Established patient visit   Patient: Gabriella Marsh   DOB: Jan 14, 1966   56 y.o. Female  MRN: [id number] Visit Date: 09/10/2022  Today's healthcare provider: [name], FNP  Re Introduced to nurse practitioner role and practice setting.  All questions answered.  Discussed provider/patient relationship and expectations.  I,Marissah Vandemark J Dushawn Pusey,acting as a scribe for [name], FNP.,have documented all relevant documentation on the behalf of [name], FNP,as directed by  [name], FNP while in the presence of [name], FNP.   Chief Complaint  Patient presents with   Diabetes   Hyperlipidemia   Subjective    HPI  Diabetes Mellitus Type II, Follow-up  Lab Results  Component Value Date   HGBA1C 8.6 (A) 09/10/2022   HGBA1C 13.1 (H) 04/24/2022   HGBA1C 11.8 (H) 01/26/2022   Wt Readings from Last 3 Encounters:  09/10/22 220 lb (99.8 kg)  08/18/22 222 lb (100.7 kg)  07/16/22 223 lb (101.2 kg)   Last seen for diabetes 4 months ago.  Management since then includes lantus 20 u qHS. She reports excellent compliance with treatment. She is not having side effects.   Symptoms: No fatigue No foot ulcerations  No appetite changes No nausea  Yes paresthesia of the feet  Yes polydipsia  No polyuria No visual disturbances   No vomiting     Home blood sugar records:  not checked  Episodes of hypoglycemia? No    Current insulin regiment: lantus Most Recent Eye Exam: due Current exercise: none, but needs PT referral for [facility] outpatient rehab. Current diet habits: well balanced  Pertinent Labs: Lab Results  Component Value Date   CHOL 166 04/24/2022   HDL 33 (L) 04/24/2022   LDLCALC 66 04/24/2022   TRIG 430 (H) 04/24/2022   CHOLHDL 5.0 (H) 04/24/2022   Lab Results  Component Value Date   NA 134 (L) 05/27/2022   K 3.8 05/27/2022   CREATININE 0.84 05/27/2022   GFRNONAA >60 05/27/2022   MICROALBUR 50 10/21/2017   LABMICR 7.4 04/24/2022      ---------------------------------------------------------------------------------------------------  Lipid/Cholesterol, Follow-up  Last lipid panel Other pertinent labs  Lab Results  Component Value Date   CHOL 166 04/24/2022   HDL 33 (L) 04/24/2022   LDLCALC 66 04/24/2022   TRIG 430 (H) 04/24/2022   CHOLHDL 5.0 (H) 04/24/2022   Lab Results  Component Value Date   ALT 20 05/27/2022   AST 24 05/27/2022   PLT 361 05/27/2022   TSH 115.542 (H) 05/27/2022     She was last seen for this 4 months ago.  Management since that visit includes continue lipitor '20mg'$ .Marland Kitchen  She reports excellent compliance with treatment. She is not having side effects.   Symptoms: No chest pain No chest pressure/discomfort  No dyspnea No lower extremity edema  No numbness or tingling of extremity No orthopnea  No palpitations No paroxysmal nocturnal dyspnea  No speech difficulty No syncope   Current diet: well balanced Current exercise: none  The 10-year ASCVD risk score (Arnett DK, et al., 2019) is: 5.5%  ---------------------------------------------------------------------------------------------------   Medications: Outpatient Medications Prior to Visit  Medication Sig   atorvastatin (LIPITOR) 20 MG tablet Take 1 tablet (20 mg total) by mouth daily.   clopidogrel (PLAVIX) 75 MG tablet TAKE 1 TABLET(75 MG) BY MOUTH DAILY   cyclobenzaprine (FLEXERIL) 5 MG tablet Take 1 tablet (5 mg total) by mouth 3 (three) times daily as needed for muscle spasms.   fluticasone (FLONASE)  50 MCG/ACT nasal spray Place 2 sprays into both nostrils daily.   furosemide (LASIX) 20 MG tablet TAKE 2 TABLETS(40 MG) BY MOUTH DAILY AS NEEDED FOR FLUID RETENTION OR SWELLING   gabapentin (NEURONTIN) 100 MG capsule TAKE 2 CAPSULES THREE TIMES A DAY ALONG WITH 600 MG THREE TIMES A DAY FOR A TOTAL DAILY DOSE OF 2400 MG DAILY   gabapentin (NEURONTIN) 600 MG tablet TAKE 1 TABLET BY MOUTH 3 TIMES  DAILY   Insulin Pen Needle 31G X 5  MM MISC 1 each by Does not apply route daily.   LANTUS SOLOSTAR 100 UNIT/ML Solostar Pen INJECT 20 UNITS UNDER THE SKIN AT BEDTIME, TITRATE TO FASTING SUGAR OF 130-180 AS DIRECTED (Patient taking differently: 49 units daily)   Melatonin 10 MG TABS Take 10 mg by mouth at bedtime.   metFORMIN (GLUCOPHAGE) 500 MG tablet Take 2 tablets (1,000 mg total) by mouth 2 (two) times daily with a meal.   nortriptyline (PAMELOR) 25 MG capsule TAKE 2 CAPSULES BY MOUTH AT  BEDTIME   omeprazole (PRILOSEC) 20 MG capsule TAKE 1 CAPSULE(20 MG) BY MOUTH DAILY   oxyCODONE-acetaminophen (PERCOCET) 5-325 MG tablet Take 1 tablet by mouth every 8 (eight) hours as needed for severe pain. Must last 30 days.   triamterene-hydrochlorothiazide (MAXZIDE-25) 37.5-25 MG tablet TAKE 1 TABLET BY MOUTH  DAILY   [DISCONTINUED] levothyroxine (SYNTHROID) 200 MCG tablet Take 1 tablet (200 mcg total) by mouth daily.   [DISCONTINUED] lisinopril (ZESTRIL) 5 MG tablet TAKE 1 TABLET(5 MG) BY MOUTH DAILY   [DISCONTINUED] traZODone (DESYREL) 50 MG tablet Take 2 tablets (100 mg total) by mouth at bedtime.   [DISCONTINUED] levothyroxine (SYNTHROID) 200 MCG tablet Take 1 tablet (200 mcg total) by mouth daily for 10 days.   No facility-administered medications prior to visit.    Review of Systems    Objective    BP 115/74 (BP Location: Right Arm, Patient Position: Sitting, Cuff Size: Normal)   Pulse 99   Resp 16   Wt 220 lb (99.8 kg)   SpO2 99%   BMI 31.57 kg/m   Physical Exam Vitals and nursing note reviewed.  Constitutional:      General: She is not in acute distress.    Appearance: Normal appearance. She is obese. She is not ill-appearing, toxic-appearing or diaphoretic.  HENT:     Head: Normocephalic and atraumatic.  Cardiovascular:     Rate and Rhythm: Normal rate and regular rhythm.     Pulses: Normal pulses.     Heart sounds: Normal heart sounds. No murmur heard.    No friction rub. No gallop.  Pulmonary:     Effort:  Pulmonary effort is normal. No respiratory distress.     Breath sounds: Normal breath sounds. No stridor. No wheezing, rhonchi or rales.  Chest:     Chest wall: No tenderness.  Abdominal:     General: Bowel sounds are normal.     Palpations: Abdomen is soft.  Genitourinary:    Comments: Foley catheter in place Musculoskeletal:        General: No swelling, tenderness, deformity or signs of injury. Normal range of motion.     Right lower leg: No edema.     Left lower leg: No edema.  Skin:    General: Skin is warm and dry.     Capillary Refill: Capillary refill takes less than 2 seconds.     Coloration: Skin is not jaundiced or pale.     Findings: No bruising, erythema,  lesion or rash.  Neurological:     General: No focal deficit present.     Mental Status: She is alert and oriented to person, place, and time. Mental status is at baseline.     Cranial Nerves: No cranial nerve deficit.     Sensory: No sensory deficit.     Motor: No weakness.     Coordination: Coordination normal.  Psychiatric:        Mood and Affect: Mood normal.        Behavior: Behavior normal.        Thought Content: Thought content normal.        Judgment: Judgment normal.     Results for orders placed or performed in visit on 09/10/22  POCT glycosylated hemoglobin (Hb A1C)  Result Value Ref Range   Hemoglobin A1C 8.6 (A) 4.0 - 5.6 %   Est. average glucose Bld gHb Est-mCnc 200     Assessment & Plan     Problem List Items Addressed This Visit       Endocrine   Acquired hypothyroidism    Complete labs today given previous elevation in TSH; previously on 200 mcg synthroid       Relevant Medications   levothyroxine (SYNTHROID) 200 MCG tablet   Other Relevant Orders   TSH + free T4   Type 2 diabetes mellitus with diabetic neuropathy, without long-term current use of insulin (HCC) - Primary    Chronic, improved; however, not at goal Unable to perform PT with [facility] due to inability to stand/no  assistive devices to assist pt with standing at their facility       Relevant Medications   lisinopril (ZESTRIL) 5 MG tablet   Other Relevant Orders   POCT glycosylated hemoglobin (Hb A1C) (Completed)   AMB Referral to Chronic Care Management Services   Type 2 diabetes mellitus with microalbuminuria, without long-term current use of insulin (HCC)    Chronic, improved Recommend titration of lantus to meeting FBG goals; however, unable to self titrate at this time d/t chronic hand pain and inability to stick herself for BG checks  Continue to recommend balanced, lower carb meals. Smaller meal size, adding snacks. Choosing water as drink of choice and increasing purposeful exercise.       Relevant Medications   lisinopril (ZESTRIL) 5 MG tablet     Other   Fall    S/p fall with fracture of R femur requiring both inpatient and SNF stay; now w/c bound d/t poor muscle strength      Relevant Orders   Ambulatory referral to Physical Therapy   Need for influenza vaccination    Consented; VIS made available; no immediate side effects following administration; plan to repeat annually        Relevant Orders   Flu Vaccine QUAD 6+ mos PF IM (Fluarix Quad PF) (Completed)   Primary insomnia    Chronic, stable Continued use of trazodone 50 mg to assist       Relevant Medications   traZODone (DESYREL) 50 MG tablet    Return in about 3 months (around 12/10/2022) for chonic disease management.      Vonna Kotyk, FNP, have reviewed all documentation for this visit. The documentation on 09/10/22 for the exam, diagnosis, procedures, and orders are all accurate and complete.   [name], [place] [phone] (phone) [phone] (fax)  [place]

## 2022-09-10 ENCOUNTER — Other Ambulatory Visit: Payer: Self-pay | Admitting: Family Medicine

## 2022-09-10 ENCOUNTER — Ambulatory Visit (INDEPENDENT_AMBULATORY_CARE_PROVIDER_SITE_OTHER): Payer: BLUE CROSS/BLUE SHIELD | Admitting: Family Medicine

## 2022-09-10 ENCOUNTER — Encounter: Payer: Self-pay | Admitting: Family Medicine

## 2022-09-10 VITALS — BP 115/74 | HR 99 | Resp 16 | Wt 220.0 lb

## 2022-09-10 DIAGNOSIS — Z23 Encounter for immunization: Secondary | ICD-10-CM | POA: Insufficient documentation

## 2022-09-10 DIAGNOSIS — W19XXXD Unspecified fall, subsequent encounter: Secondary | ICD-10-CM | POA: Diagnosis not present

## 2022-09-10 DIAGNOSIS — E1129 Type 2 diabetes mellitus with other diabetic kidney complication: Secondary | ICD-10-CM

## 2022-09-10 DIAGNOSIS — F5101 Primary insomnia: Secondary | ICD-10-CM

## 2022-09-10 DIAGNOSIS — E1140 Type 2 diabetes mellitus with diabetic neuropathy, unspecified: Secondary | ICD-10-CM

## 2022-09-10 DIAGNOSIS — R809 Proteinuria, unspecified: Secondary | ICD-10-CM

## 2022-09-10 DIAGNOSIS — E039 Hypothyroidism, unspecified: Secondary | ICD-10-CM

## 2022-09-10 LAB — POCT GLYCOSYLATED HEMOGLOBIN (HGB A1C)
Est. average glucose Bld gHb Est-mCnc: 200
Hemoglobin A1C: 8.6 % — AB (ref 4.0–5.6)

## 2022-09-10 MED ORDER — LEVOTHYROXINE SODIUM 200 MCG PO TABS: 200.0000 ug | ORAL_TABLET | Freq: Every day | ORAL | 0 refills | Status: DC

## 2022-09-10 MED ORDER — LISINOPRIL 5 MG PO TABS: ORAL_TABLET | ORAL | 0 refills | Status: DC

## 2022-09-10 MED ORDER — TRAZODONE HCL 50 MG PO TABS: 100.0000 mg | ORAL_TABLET | Freq: Every day | ORAL | 0 refills | Status: DC

## 2022-09-10 NOTE — Assessment & Plan Note (Signed)
Consented; VIS made available; no immediate side effects following administration; plan to repeat annually   

## 2022-09-10 NOTE — Assessment & Plan Note (Signed)
S/p fall with fracture of R femur requiring both inpatient and SNF stay; now w/c bound d/t poor muscle strength

## 2022-09-10 NOTE — Assessment & Plan Note (Signed)
Complete labs today given previous elevation in TSH; previously on 200 mcg synthroid

## 2022-09-10 NOTE — Assessment & Plan Note (Signed)
Chronic, stable Continued use of trazodone 50 mg to assist

## 2022-09-10 NOTE — Assessment & Plan Note (Signed)
Chronic, improved; however, not at goal Unable to perform PT with [facility] due to inability to stand/no assistive devices to assist pt with standing at their facility

## 2022-09-10 NOTE — Assessment & Plan Note (Signed)
Chronic, improved Recommend titration of lantus to meeting FBG goals; however, unable to self titrate at this time d/t chronic hand pain and inability to stick herself for BG checks  Continue to recommend balanced, lower carb meals. Smaller meal size, adding snacks. Choosing water as drink of choice and increasing purposeful exercise.

## 2022-09-11 ENCOUNTER — Telehealth: Payer: Self-pay

## 2022-09-11 LAB — TSH+FREE T4
Free T4: 2.39 ng/dL — ABNORMAL HIGH (ref 0.82–1.77)
TSH: 13.2 u[IU]/mL — ABNORMAL HIGH (ref 0.450–4.500)

## 2022-09-11 NOTE — Telephone Encounter (Signed)
Copied from [place]. Topic: General - Other >> Sep 10, 2022  2:08 PM Ludger Nutting wrote: Patient called to provide fax number of where to send medical records for work [phone] Attn: Mable Fill

## 2022-09-11 NOTE — Telephone Encounter (Signed)
Spoke with patient. She stated FMLA for her needed her after visit summary for yesterday's visit. It has been faxed to [facility].

## 2022-09-13 ENCOUNTER — Other Ambulatory Visit: Payer: Self-pay | Admitting: Family Medicine

## 2022-09-13 DIAGNOSIS — E039 Hypothyroidism, unspecified: Secondary | ICD-10-CM

## 2022-09-13 MED ORDER — LEVOTHYROXINE SODIUM 25 MCG PO TABS: 25.0000 ug | ORAL_TABLET | Freq: Every day | ORAL | 0 refills | Status: DC

## 2022-09-13 NOTE — Progress Notes (Signed)
Thyroid remains overstimulated and low functioning; however, improved from 3 months ago. New Rx will be called in.  [name], [place] [address] [place], [place] [address] [phone] (phone) [phone] (fax) [place]

## 2022-09-14 ENCOUNTER — Other Ambulatory Visit: Payer: Self-pay | Admitting: Family Medicine

## 2022-09-15 ENCOUNTER — Ambulatory Visit: Payer: BLUE CROSS/BLUE SHIELD | Admitting: Physician Assistant

## 2022-09-15 DIAGNOSIS — R339 Retention of urine, unspecified: Secondary | ICD-10-CM | POA: Diagnosis not present

## 2022-09-15 NOTE — Progress Notes (Signed)
Cath Change/ Replacement  Patient is present today for a catheter change due to urinary retention.  59m of water was removed from the balloon, a 16FR foley cath was removed without difficulty.  Patient was cleaned and prepped in a sterile fashion with betadine. A 16 FR foley cath was replaced into the bladder, no complications were noted. Urine return was not noted but the catheter was palpated in the urethra via the anterior vaginal wall. The balloon was filled with 156mof sterile water. A night bag was attached for drainage.  Patient tolerated well.  Performed by: SaDebroah LoopPA-C  Additional notes: Patient reports she is still unable to get to her potty chair; further PT is pending. She prefers to keep her Foley in place at least 1 more month. She reports cloudy urine but denies lower abdominal pain, low back pain, fever, chills, nausea, and vomiting. Low concern for infection today.  Follow up: Return in about 4 weeks (around 10/13/2022) for Cath change vs voiding trial.

## 2022-09-16 ENCOUNTER — Telehealth: Payer: Self-pay | Admitting: *Deleted

## 2022-09-16 NOTE — Chronic Care Management (AMB) (Signed)
  Care Management   Note  09/16/2022 Name: Gabriella Marsh MRN: [id number] DOB: 04/18/1966  Gabriella Marsh is a 56 y.o. year old female who is a primary care patient of [name], FNP. I reached out to [name] by phone today offer care coordination services.   [name] was given information about care management services today including:  Care management services include personalized support from designated clinical staff supervised by her physician, including individualized plan of care and coordination with other care providers 24/7 contact phone numbers for assistance for urgent and routine care needs. The patient may stop care management services at any time by phone call to the office staff.  Patient agreed to services and verbal consent obtained.   Follow up plan: Telephone appointment with care management team member scheduled for:09/18/2022 and 09/29/2022  Julian Hy, [place] Direct Dial: [phone]

## 2022-09-17 ENCOUNTER — Encounter: Payer: Self-pay | Admitting: Family Medicine

## 2022-09-18 ENCOUNTER — Ambulatory Visit: Payer: Self-pay

## 2022-09-18 NOTE — Patient Instructions (Signed)
Visit Information  Thank you for taking time to visit with me today. Please don't hesitate to contact me if I can be of assistance to you.   Following are the goals we discussed today:   Goals Addressed             This Visit's Progress    RNCM: "My biggest concern is my mobility"       Care Coordination Interventions: Evaluation of current treatment plan related to mobility issues, multiple factors impacting her health and well being and patient's adherence to plan as established by provider Advised patient to call the office for changes in mobility, decreased functionality of extremities, uncontrolled pain and discomfort, new falls, new factors that impact her ability to be mobile in her home environment Provided education to patient re: support and education of the care coordination program, use of mindfulness when feeling down or anxious about her limited mobility Reviewed medications with patient and discussed compliance and concerns Provided patient with exercise and stretching, cubi  educational materials related to helping with increasing activity and mobility Reviewed scheduled/upcoming provider appointments including 12-09-2022 at 1040 am Discussed plans with patient for ongoing care management follow up and provided patient with direct contact information for care management team Advised patient to discuss new concerns related to mobility and PT options  with provider Screening for signs and symptoms of depression related to chronic disease state  Assessed social determinant of health barriers The patient was not able to do PT at [facility] as they did not have the equipment to help with her being able to stand. The patient is going to call [facility] today to find out when she can work with PT at that site. The order for PT has been sent there. She feels if she could work with PT that would help her to be more mobile. She has not been able to walk for over 3 months now due to a fall with  a femur fracture. Education and support provided.  Has help in the home 2 hours a day and another helper that takes her to appointments. Her husband is her biggest support and help.   Active listening / Reflection utilized  Emotional Support Provided      RNCM: Effective Management of DM       Care Coordination Interventions:  Lab Results  Component Value Date   HGBA1C 8.6 (A) 09/10/2022    Provided education to patient about basic DM disease process Reviewed medications with patient and discussed importance of medication adherence. Has a message in to the pcp about Lantus dosage. Read the notes and instructed the patient to take as directed and wait to hear back from the pcp.  Counseled on importance of regular laboratory monitoring as prescribed Discussed plans with patient for ongoing care management follow up and provided patient with direct contact information for care management team Provided patient with written educational materials related to hypo and hyperglycemia and importance of correct treatment. Review and education provided. The patient states when her sugars get low that she gets fidgety. Has an indwelling catheter currently due to mobility issues Reviewed scheduled/upcoming provider appointments including: 12-09-2022 at 1040 am Advised patient, providing education and rationale, to check cbg as directed and record, calling pcp for findings outside established parameters. The patient is not checking her blood sugars. The patient states that it hurts her fingers and she is in contact with the insurance company to see if she can get a continuous reader. Education provided.  Review of patient status, including review of consultants reports, relevant laboratory and other test results, and medications completed Screening for signs and symptoms of depression related to chronic disease state  Assessed social determinant of health barriers The patient agrees to the care coordination  program. Review of the goals of care and the program criteria. The patient given the direct number to call the [facility]. Education and support given           Our next appointment is by telephone on 11-20-2022 at 11 am  Please call the care guide team at [phone] if you need to cancel or reschedule your appointment.   If you are experiencing a Mental Health or [place] or need someone to talk to, please call the Suicide and Crisis Lifeline: 988 call the [place] National Suicide Prevention Lifeline: 845-728-5331 or TTY: 337-646-3747 TTY 6846736031) to talk to a trained counselor call 1-800-273-TALK (toll free, 24 hour hotline)  Patient verbalizes understanding of instructions and care plan provided today and agrees to view in [place]. Active MyChart status and patient understanding of how to access instructions and care plan via MyChart confirmed with patient.     Telephone follow up appointment with care management team member scheduled for: 11-20-2022 at 43 am  [place], MSN, [employer]  Mobile: [phone]    Mindfulness-Based Stress Reduction Mindfulness-based stress reduction (MBSR) is a program that helps people learn to practice mindfulness. Mindfulness is the practice of consciously paying attention to the present moment. MBSR focuses on developing self-awareness, which lets you respond to life stress without judgment or negative feelings. It can be learned and practiced through techniques such as education, breathing exercises, meditation, and yoga. MBSR includes several mindfulness techniques in one program. MBSR works best when you understand the treatment, are willing to try new things, and can commit to spending time practicing what you learn. MBSR training may include learning about: How your feelings, thoughts, and reactions affect your body. New ways to respond to things that cause negative thoughts to start  (triggers). How to notice your thoughts and let go of them. Practicing awareness of everyday things that you normally do without thinking. The techniques and goals of different types of meditation. What are the benefits of MBSR? MBSR can have many benefits, which include helping you to: Develop self-awareness. This means knowing and understanding yourself. Learn skills and attitudes that help you to take part in your own health care. Learn new ways to care for yourself. Be more accepting about how things are, and let things go. Be less judgmental and approach things with an open mind. Be patient with yourself and trust yourself more. MBSR has also been shown to: Reduce negative emotions, such as sadness, overwhelm, and worry. Improve memory and focus. Change how you sense and react to pain. Boost your body's ability to fight infections. Help you connect better with other people. Improve your sense of well-being. How to practice mindfulness To do a basic awareness exercise: Find a comfortable place to sit. Pay attention to the present moment. Notice your thoughts, feelings, and surroundings just as they are. Avoid judging yourself, your feelings, or your surroundings. Make note of any judgment that comes up and let it go. Your mind may wander, and that is okay. Make note of when your thoughts drift, and return your attention to the present moment. To do basic mindfulness meditation: Find a comfortable place to sit. This may include a stable chair  or a firm floor cushion. Sit upright with your back straight. Let your arms fall next to your sides, with your hands resting on your legs. If you are sitting in a chair, rest your feet flat on the floor. If you are sitting on a cushion, cross your legs in front of you. Keep your head in a neutral position with your chin dropped slightly. Relax your jaw and rest the tip of your tongue on the roof of your mouth. Drop your gaze to the floor or  close your eyes. Breathe normally and pay attention to your breath. Feel the air moving in and out of your nose. Feel your belly expanding and relaxing with each breath. Your mind may wander, and that is okay. Make note of when your thoughts drift, and return your attention to your breath. Avoid judging yourself, your feelings, or your surroundings. Make note of any judgment or feelings that come up, let them go, and bring your attention back to your breath. When you are ready, lift your gaze or open your eyes. Pay attention to how your body feels after the meditation. Follow these instructions at home:  Find a local in-person or online MBSR program. Set aside some time regularly for mindfulness practice. Practice every day if you can. Even 10 minutes of practice is helpful. Find a mindfulness practice that works best for you. This may include one or more of the following: Meditation. This involves focusing your mind on a certain thought or activity. Breathing awareness exercises. These help you to stay present by focusing on your breath. Body scan. For this practice, you lie down and pay attention to each part of your body from head to toe. You can identify tension and soreness and consciously relax parts of your body. Yoga. Yoga involves stretching and breathing, and it can improve your ability to move and be flexible. It can also help you to test your body's limits, which can help you release stress. Mindful eating. This way of eating involves focusing on the taste, texture, color, and smell of each bite of food. This slows down eating and helps you feel full sooner. For this reason, it can be an important part of a weight loss plan. Find a podcast or recording that provides guidance for breathing awareness, body scan, or meditation exercises. You can listen to these any time when you have a free moment to rest without distractions. Follow your treatment plan as told by your health care provider.  This may include taking regular medicines and making changes to your diet or lifestyle as recommended. Where to find more information You can find more information about MBSR from: Your health care provider. Community-based meditation centers or programs. Programs offered near you. Summary Mindfulness-based stress reduction (MBSR) is a program that teaches you how to consciously pay attention to the present moment. It is used to help you deal better with daily stress, feelings, and pain. MBSR focuses on developing self-awareness, which allows you to respond to life stress without judgment or negative feelings. MBSR programs may involve learning different mindfulness practices, such as breathing exercises, meditation, yoga, body scan, or mindful eating. Find a mindfulness practice that works best for you, and set aside time for it on a regular basis. This information is not intended to replace advice given to you by your health care provider. Make sure you discuss any questions you have with your health care provider. Document Revised: 07/17/2021 Document Reviewed: 07/17/2021 Elsevier Patient Education  2023 Elsevier  Inc.   Exercise Information for Aging Adults Staying physically active is important as you age. Physical activity and exercise can help in maintaining quality of life, health, physical function, and reducing falls. The four types of exercises that are best for older adults are endurance, strength, balance, and flexibility. Contact your health care provider before you start any exercise routine. Ask your health care provider what activities are safe for you. What are the risks? Risks associated with exercising include: Overdoing it. This may lead to sore muscles or fatigue. Falls. Injuries. Dehydration. How to do these exercises Endurance exercises Endurance (aerobic) exercises raise your breathing rate and heart rate. Increasing your endurance helps you do everyday tasks and  stay healthy. By improving the health of your body system that includes your heart, lungs, and blood vessels (circulatory system), you may also delay or prevent diseases such as heart disease, diabetes, and weak bones (osteoporosis). Types of endurance exercises include: Sports. Indoor activities, such as using gym equipment, doing water aerobics, or dancing. Outdoor activities, such as biking or jogging. Tasks around the house, such as gardening, yard work, and heavy household chores like cleaning. Walking, such as hiking or walking around your neighborhood. When doing endurance exercises, make sure you: Are aware of your surroundings. Use safety equipment as directed. Dress in layers when exercising outdoors. Drink plenty of water to stay well hydrated. Build up endurance slowly. Start with 10 minutes at a time, and gradually build up to doing 30 minutes at a time. Unless your health care provider gave you different instructions, aim to exercise for a total of 150 minutes a week. Spread out that time so you are working on endurance 3 or more days a week. Strength exercises Lifting, pulling, or pushing weights helps to strengthen muscles. Having stronger muscles makes it easier to do everyday activities, such as getting up from a chair, climbing stairs, carrying groceries, and playing with grandchildren. Strength exercises include arm and leg exercises that may be done: With weights. Without weights (using your own body weight). With a resistance band. When doing strength exercises: Move smoothly and steadily. Do not suddenly thrust or jerk the weights, the resistance band, or your body. Start with no weights or with light weights, and gradually add more weight over time. Eventually, aim to use weights that are hard or very hard for you to lift. This means that you are able to do 8 repetitions with the weight, and the last few repetitions are very challenging. Lift or push weights into position  for 3 seconds, hold the position for 1 second, and then take 3 seconds to return to your starting position. Breathe out (exhale) during difficult movements, like lifting or pushing weights. Breathe in (inhale) to relax your muscles before the next repetition. Consider alternating arms or legs, especially when you first start strength exercises. Expect some slight muscle soreness after each session. Do strength exercises on 2 or more days a week, for 30 minutes at a time. Avoid exercising the same muscle groups two days in a row. For example, if you work on your leg muscles one day, work on your arm muscles the next day. When you can do two sets of 10-15 repetitions with a certain weight, increase the amount of weight. Balance exercises Balance exercises can help to prevent falls. Balance exercises include: Standing on one foot. Heel-to-toe walk. Balance walk. Tai chi. Make sure you have something sturdy to hold onto while doing balance exercises, such as a sturdy  chair. As your balance improves, challenge yourself by holding on to the chair with one hand instead of two, and then with no hands. Trying exercises with your eyes closed also challenges your balance, but be sure to have a sturdy surface (like a countertop) close by in case you need it. Do balance exercises as often as you want, or as often as directed by your health care provider. Flexibility exercises  Flexibility exercises improve how far you can bend, straighten, move, or rotate parts of your body (range of motion). These exercises also help you do everyday activities such as getting dressed or reaching for objects. Flexibility exercises include stretching different parts of the body, and they may be done in a standing or seated position or on the floor. When stretching, make sure you: Keep a slight bend in your arms and legs. Avoid completely straightening ("locking") your joints. Do not stretch so far that you feel pain. You should  feel a mild stretching feeling. You may try stretching farther as you become more flexible over time. Relax and breathe between stretches. Hold on to something sturdy for balance as needed. Hold each stretch for 10-30 seconds. Repeat each stretch 3-5 times. General safety tips Exercise in well-lit areas. Do not hold your breath during exercises or stretches. Warm up before exercising, and cool down after exercising. This can help prevent injury. Drink plenty of water during exercise or any activity that makes you sweat. If you are not sure if an exercise is safe for you, or you are not sure how to do an exercise, talk with your health care provider. This is especially important if you have had surgery on muscles, bones, or joints (orthopedic surgery). Where to find more information You can find more information about exercise for older adults from: Your local health department, fitness center, or community center. These facilities may have programs for aging adults. [employer] on Aging: http://kim-miller.com/ National Council on Aging: www.ncoa.org Summary Staying physically active is important as you age. Doing endurance, strength, balance, and flexibility exercises can help in maintaining quality of life, health, physical function, and reducing falls. Make sure to contact your health care provider before you start any exercise routine. Ask your health care provider what activities are safe for you. This information is not intended to replace advice given to you by your health care provider. Make sure you discuss any questions you have with your health care provider. Document Revised: 04/21/2021 Document Reviewed: 04/21/2021 Elsevier Patient Education  [place].    It is important to avoid accidents which may result in broken bones.  Here are a few ideas on how to make your home safer so you will be less likely to trip or fall.  Use nonskid mats or non slip strips in your  shower or tub, on your bathroom floor and around sinks.  If you know that you have spilled water, wipe it up! In the bathroom, it is important to have properly installed grab bars on the walls or on the edge of the tub.  Towel racks are NOT strong enough for you to hold onto or to pull on for support. Stairs and hallways should have enough light.  Add lamps or night lights if you need ore light. It is good to have handrails on both sides of the stairs if possible.  Always fix broken handrails right away. It is important to see the edges of steps.  Paint the edges of outdoor steps white so  you can see them better.  Put colored tape on the edge of inside steps. Throw-rugs are dangerous because they can slide.  Removing the rugs is the best idea, but if they must stay, add adhesive carpet tape to prevent slipping. Do not keep things on stairs or in the halls.  Remove small furniture that blocks the halls as it may cause you to trip.  Keep telephone and electrical cords out of the way where you walk. Always were sturdy, rubber-soled shoes for good support.  Never wear just socks, especially on the stairs.  Socks may cause you to slip or fall.  Do not wear full-length housecoats as you can easily trip on the bottom.  Place the things you use the most on the shelves that are the easiest to reach.  If you use a stepstool, make sure it is in good condition.  If you feel unsteady, DO NOT climb, ask for help. If a health professional advises you to use a cane or walker, do not be ashamed.  These items can keep you from falling and breaking your bones.Mobility: Wheelchair    Wear sturdy shoes that stay securely on feet and have non-skid soles. Use heel to steer chair when turning and going over thresholds. Ask for help on steep inclines.  Copyright  VHI. All rights reserved.    Diabetes Mellitus and Nutrition, Adult When you have diabetes, or diabetes mellitus, it is very important to have healthy eating  habits because your blood sugar (glucose) levels are greatly affected by what you eat and drink. Eating healthy foods in the right amounts, at about the same times every day, can help you: Manage your blood glucose. Lower your risk of heart disease. Improve your blood pressure. Reach or maintain a healthy weight. What can affect my meal plan? Every person with diabetes is different, and each person has different needs for a meal plan. Your health care provider may recommend that you work with a dietitian to make a meal plan that is best for you. Your meal plan may vary depending on factors such as: The calories you need. The medicines you take. Your weight. Your blood glucose, blood pressure, and cholesterol levels. Your activity level. Other health conditions you have, such as heart or kidney disease. How do carbohydrates affect me? Carbohydrates, also called carbs, affect your blood glucose level more than any other type of food. Eating carbs raises the amount of glucose in your blood. It is important to know how many carbs you can safely have in each meal. This is different for every person. Your dietitian can help you calculate how many carbs you should have at each meal and for each snack. How does alcohol affect me? Alcohol can cause a decrease in blood glucose (hypoglycemia), especially if you use insulin or take certain diabetes medicines by mouth. Hypoglycemia can be a life-threatening condition. Symptoms of hypoglycemia, such as sleepiness, dizziness, and confusion, are similar to symptoms of having too much alcohol. Do not drink alcohol if: Your health care provider tells you not to drink. You are pregnant, may be pregnant, or are planning to become pregnant. If you drink alcohol: Limit how much you have to: 0-1 drink a day for women. 0-2 drinks a day for men. Know how much alcohol is in your drink. In the [place] one drink equals one 12 oz bottle of beer (355 mL), one 5 oz glass of  wine (148 mL), or one 1 oz glass of hard liquor (  44 mL). Keep yourself hydrated with water, diet soda, or unsweetened iced tea. Keep in mind that regular soda, juice, and other mixers may contain a lot of sugar and must be counted as carbs. What are tips for following this plan?  Reading food labels Start by checking the serving size on the Nutrition Facts label of packaged foods and drinks. The number of calories and the amount of carbs, fats, and other nutrients listed on the label are based on one serving of the item. Many items contain more than one serving per package. Check the total grams (g) of carbs in one serving. Check the number of grams of saturated fats and trans fats in one serving. Choose foods that have a low amount or none of these fats. Check the number of milligrams (mg) of salt (sodium) in one serving. Most people should limit total sodium intake to less than 2,300 mg per day. Always check the nutrition information of foods labeled as "low-fat" or "nonfat." These foods may be higher in added sugar or refined carbs and should be avoided. Talk to your dietitian to identify your daily goals for nutrients listed on the label. Shopping Avoid buying canned, pre-made, or processed foods. These foods tend to be high in fat, sodium, and added sugar. Shop around the outside edge of the grocery store. This is where you will most often find fresh fruits and vegetables, bulk grains, fresh meats, and fresh dairy products. Cooking Use low-heat cooking methods, such as baking, instead of high-heat cooking methods, such as deep frying. Cook using healthy oils, such as olive, canola, or sunflower oil. Avoid cooking with butter, cream, or high-fat meats. Meal planning Eat meals and snacks regularly, preferably at the same times every day. Avoid going long periods of time without eating. Eat foods that are high in fiber, such as fresh fruits, vegetables, beans, and whole grains. Eat 4-6 oz  (112-168 g) of lean protein each day, such as lean meat, chicken, fish, eggs, or tofu. One ounce (oz) (28 g) of lean protein is equal to: 1 oz (28 g) of meat, chicken, or fish. 1 egg.  cup (62 g) of tofu. Eat some foods each day that contain healthy fats, such as avocado, nuts, seeds, and fish. What foods should I eat? Fruits Berries. Apples. Oranges. Peaches. Apricots. Plums. Grapes. Mangoes. Papayas. Pomegranates. Kiwi. Cherries. Vegetables Leafy greens, including lettuce, spinach, kale, chard, collard greens, mustard greens, and cabbage. Beets. Cauliflower. Broccoli. Carrots. Green beans. Tomatoes. Peppers. Onions. Cucumbers. Brussels sprouts. Grains Whole grains, such as whole-wheat or whole-grain bread, crackers, tortillas, cereal, and pasta. Unsweetened oatmeal. Quinoa. Brown or wild rice. Meats and other proteins Seafood. Poultry without skin. Lean cuts of poultry and beef. Tofu. Nuts. Seeds. Dairy Low-fat or fat-free dairy products such as milk, yogurt, and cheese. The items listed above may not be a complete list of foods and beverages you can eat and drink. Contact a dietitian for more information. What foods should I avoid? Fruits Fruits canned with syrup. Vegetables Canned vegetables. Frozen vegetables with butter or cream sauce. Grains Refined white flour and flour products such as bread, pasta, snack foods, and cereals. Avoid all processed foods. Meats and other proteins Fatty cuts of meat. Poultry with skin. Breaded or fried meats. Processed meat. Avoid saturated fats. Dairy Full-fat yogurt, cheese, or milk. Beverages Sweetened drinks, such as soda or iced tea. The items listed above may not be a complete list of foods and beverages you should avoid. Contact a dietitian  for more information. Questions to ask a health care provider Do I need to meet with a certified diabetes care and education specialist? Do I need to meet with a dietitian? What number can I call if I  have questions? When are the best times to check my blood glucose? Where to find more information: American Diabetes Association: diabetes.org Academy of Nutrition and Dietetics: eatright.[employer] of Diabetes and Digestive and Kidney Diseases: AmenCredit.is Association of Diabetes Care & Education Specialists: diabeteseducator.org Summary It is important to have healthy eating habits because your blood sugar (glucose) levels are greatly affected by what you eat and drink. It is important to use alcohol carefully. A healthy meal plan will help you manage your blood glucose and lower your risk of heart disease. Your health care provider may recommend that you work with a dietitian to make a meal plan that is best for you. This information is not intended to replace advice given to you by your health care provider. Make sure you discuss any questions you have with your health care provider. Document Revised: 07/10/2020 Document Reviewed: 07/10/2020 Elsevier Patient Education  [place].

## 2022-09-18 NOTE — Patient Outreach (Signed)
Care Coordination   Initial Visit Note   09/18/2022 Name: Gabriella Marsh MRN: [id number] DOB: [birth year]  Gabriella Marsh is a 56 y.o. year old female who sees [name], FNP for primary care. I spoke with  Gabriella Marsh by phone today.  What matters to the patients health and wellness today?  Mobility is the biggest concern for the patient, also control of DM    Goals Addressed             This Visit's Progress    RNCM: "My biggest concern is my mobility"       Care Coordination Interventions: Evaluation of current treatment plan related to mobility issues, multiple factors impacting her health and well being and patient's adherence to plan as established by provider Advised patient to call the office for changes in mobility, decreased functionality of extremities, uncontrolled pain and discomfort, new falls, new factors that impact her ability to be mobile in her home environment Provided education to patient re: support and education of the care coordination program, use of mindfulness when feeling down or anxious about her limited mobility Reviewed medications with patient and discussed compliance and concerns Provided patient with exercise and stretching, cubi  educational materials related to helping with increasing activity and mobility Reviewed scheduled/upcoming provider appointments including 12-09-2022 at 1040 am Discussed plans with patient for ongoing care management follow up and provided patient with direct contact information for care management team Advised patient to discuss new concerns related to mobility and PT options  with provider Screening for signs and symptoms of depression related to chronic disease state  Assessed social determinant of health barriers The patient was not able to do PT at [facility] as they did not have the equipment to help with her being able to stand. The patient is going to call [facility] today to find out when she can  work with PT at that site. The order for PT has been sent there. She feels if she could work with PT that would help her to be more mobile. She has not been able to walk for over 3 months now due to a fall with a femur fracture. Education and support provided.  Has help in the home 2 hours a day and another helper that takes her to appointments. Her husband is her biggest support and help.   Active listening / Reflection utilized  Emotional Support Provided      RNCM: Effective Management of DM       Care Coordination Interventions:  Lab Results  Component Value Date   HGBA1C 8.6 (A) 09/10/2022    Provided education to patient about basic DM disease process Reviewed medications with patient and discussed importance of medication adherence. Has a message in to the pcp about Lantus dosage. Read the notes and instructed the patient to take as directed and wait to hear back from the pcp.  Counseled on importance of regular laboratory monitoring as prescribed Discussed plans with patient for ongoing care management follow up and provided patient with direct contact information for care management team Provided patient with written educational materials related to hypo and hyperglycemia and importance of correct treatment. Review and education provided. The patient states when her sugars get low that she gets fidgety. Has an indwelling catheter currently due to mobility issues Reviewed scheduled/upcoming provider appointments including: 12-09-2022 at 1040 am Advised patient, providing education and rationale, to check cbg as directed and record, calling pcp for findings outside established parameters.  The patient is not checking her blood sugars. The patient states that it hurts her fingers and she is in contact with the insurance company to see if she can get a continuous reader. Education provided.  Review of patient status, including review of consultants reports, relevant laboratory and other test  results, and medications completed Screening for signs and symptoms of depression related to chronic disease state  Assessed social determinant of health barriers The patient agrees to the care coordination program. Review of the goals of care and the program criteria. The patient given the direct number to call the [facility]. Education and support given           SDOH assessments and interventions completed:  Yes  SDOH Interventions Today    Flowsheet Row Most Recent Value  SDOH Interventions   Food Insecurity Interventions Intervention Not Indicated  Housing Interventions Intervention Not Indicated  Transportation Interventions Intervention Not Indicated  Utilities Interventions Intervention Not Indicated  Alcohol Usage Interventions Intervention Not Indicated (Score <7)  Stress Interventions Intervention Not Indicated        Care Coordination Interventions Activated:  Yes  Care Coordination Interventions:  Yes, provided   Follow up plan: Follow up call scheduled for 11-20-2022 at 11 am    Encounter Outcome:  Pt. Visit Completed   Noreene Larsson RN, MSN, [place]  Mobile: [phone]

## 2022-09-22 ENCOUNTER — Ambulatory Visit: Payer: BLUE CROSS/BLUE SHIELD | Admitting: Physical Therapy

## 2022-09-28 ENCOUNTER — Encounter: Payer: Self-pay | Admitting: Physical Therapy

## 2022-09-28 ENCOUNTER — Ambulatory Visit: Payer: BLUE CROSS/BLUE SHIELD | Attending: Family Medicine | Admitting: Physical Therapy

## 2022-09-28 DIAGNOSIS — R2689 Other abnormalities of gait and mobility: Secondary | ICD-10-CM | POA: Diagnosis present

## 2022-09-28 DIAGNOSIS — R262 Difficulty in walking, not elsewhere classified: Secondary | ICD-10-CM | POA: Insufficient documentation

## 2022-09-28 DIAGNOSIS — W19XXXD Unspecified fall, subsequent encounter: Secondary | ICD-10-CM | POA: Insufficient documentation

## 2022-09-28 DIAGNOSIS — M6281 Muscle weakness (generalized): Secondary | ICD-10-CM | POA: Insufficient documentation

## 2022-09-28 NOTE — Therapy (Signed)
OUTPATIENT PHYSICAL THERAPY LOWER EXTREMITY EVALUATION   Patient Name: Gabriella Marsh MRN: [id number] DOB:January 23, 1966, 56 y.o., female Today's Date: 09/28/2022   PT End of Session - 09/28/22 1710     Visit Number 1    Number of Visits 24    Date for PT Re-Evaluation 12/21/22    Authorization Type 20 visits per calandar year    PT Start Time 47    PT Stop Time 1656    PT Time Calculation (min) 60 min    Activity Tolerance Patient tolerated treatment well    Behavior During Therapy WFL for tasks assessed/performed             Past Medical History:  Diagnosis Date   Arthritis    knees   Blood clotting disorder ([place])    on toe    Degenerative disc disease, lumbar    Diabetes mellitus without complication (HCC)    GERD (gastroesophageal reflux disease)    Headache    daily - AM (has not been able to have SPG blocks lately)   Hyperlipidemia    Hypertension    Neuropathy    feet   Vertigo    3-4x/yr   Past Surgical History:  Procedure Laterality Date   ABDOMINAL HYSTERECTOMY     But still has cervix   AMPUTATION TOE Right 07/24/2021   Procedure: AMPUTATION TOE;  Surgeon: Sharlotte Alamo, DPM;  Location: [facility] ORS;  Service: Podiatry;  Laterality: Right;   CESAREAN SECTION     CHOLECYSTECTOMY     COLONOSCOPY WITH PROPOFOL N/A 02/20/2022   Procedure: COLONOSCOPY WITH PROPOFOL;  Surgeon: [name], MD;  Location: [facility] ENDOSCOPY;  Service: Gastroenterology;  Laterality: N/A;   left arm frature  10/12/2020   OVARIAN CYST SURGERY     PARATHYROIDECTOMY  03/28/2021   Procedure: PARATHYROIDECTOMY AUTOTRANSPLANT;  Surgeon: Fredirick Maudlin, MD;  Location: [facility] ORS;  Service: General;;   right femur surgery Right    SHOULDER SURGERY Left 12/26/014   Dr. Little Ishikawa, [place] N/A 03/28/2021   Procedure: THYROIDECTOMY, total;  Surgeon: Fredirick Maudlin, MD;  Location: [facility] ORS;  Service: General;  Laterality: N/A;  Provider requesting 3 hours /180 minutes for  procedure   TONSILLECTOMY AND ADENOIDECTOMY     UTERINE FIBROID SURGERY     Patient Active Problem List   Diagnosis Date Noted   Need for influenza vaccination 09/10/2022   Primary insomnia 09/10/2022   Impaired functional mobility, balance, gait, and endurance 06/09/2022   Type 2 diabetes mellitus with diabetic neuropathy, without long-term current use of insulin ([place]) 04/24/2022   Acquired hypothyroidism 04/24/2022   Hyperlipidemia associated with type 2 diabetes mellitus ([place]) 04/24/2022   Fall 04/24/2022   Dizziness 04/24/2022   Iron deficiency anemia 04/24/2022   Colon cancer screening 01/26/2022   Encounter for screening mammogram for malignant neoplasm of breast 01/26/2022   Chronic sensorimotor polyneuropathy with axonal and demyelinating features 01/12/2022   Osteoarthritis of knees (Bilateral) 01/12/2022   Tricompartment osteoarthritis of knees (Bilateral) 01/12/2022    Grade 1 Retrolisthesis (25m) of L5 over S1 01/12/2022   History of proximal humerus fracture (Left) 01/12/2022   Vitamin D deficiency 01/12/2022   Elevated hemoglobin A1c 01/12/2022   Hypochloremia 01/12/2022   Hyperglycemia 01/12/2022   Chronic peripheral neuropathic pain 01/12/2022   Type 2 diabetes mellitus with peripheral neuropathy ([place] 01/12/2022   Glands swollen 01/09/2022   Acute cough 01/09/2022   Acute non-recurrent pansinusitis 01/09/2022   Rhinorrhea 01/09/2022   Left  acute otitis media 01/09/2022   Pharmacologic therapy 11/12/2021   Disorder of skeletal system 11/12/2021   Problems influencing health status 11/12/2021   Chronic hand pain (1ry area of Pain) (Bilateral) (L>R) 11/12/2021   Chronic feet pain (3ry area of Pain) (Bilateral) (L>R) 11/12/2021   Chronic shoulder pain (4th area of Pain) (Left) 11/12/2021   Chronic knee pain (5th area of Pain) (Bilateral) (L>R) 11/12/2021   Lumbar facet syndrome (Bilateral) 11/12/2021   Muscle spasm 10/22/2021   Type 2 diabetes mellitus with  microalbuminuria, without long-term current use of insulin ([place]) 10/22/2021   Diabetic foot infection ([place]) 07/22/2021   Depression with anxiety 07/22/2021   Sepsis ([place]) 07/22/2021   COVID-19 virus infection 07/22/2021   Hypocalcemia 07/17/2021   Chronic pain syndrome 07/17/2021   S/P total thyroidectomy 03/28/2021   Thyroid nodule    Blue toe syndrome of left lower extremity ([place]) 10/01/2020   Schamberg's purpura 10/09/2019   Leg swelling 10/09/2019   Benign positional vertigo 02/03/2016   Common migraine with intractable migraine 06/18/2015   Anxiety 05/04/2015   Adaptation reaction 05/03/2015   DDD (degenerative disc disease), lumbosacral 05/03/2015   Hypercholesteremia 05/03/2015   Insomnia due to medical condition 05/03/2015   Adiposity 05/03/2015   Disorder of peripheral nervous system 05/03/2015   Snores 05/03/2015   Diabetic peripheral neuropathy ([place]) 03/06/2015   Essential hypertension 03/06/2015   Elevated liver function tests 03/06/2015   Obesity 03/06/2015   GERD (gastroesophageal reflux disease) 03/06/2015   Multinodular goiter 03/06/2015   RLS (restless legs syndrome) 03/06/2015   Chronic low back pain (2ry area of Pain) (Bilateral) (L>R) w/o sciatica 03/06/2015   Leg cramps 03/06/2015    PCP: [name], FNP  REFERRING PROVIDER: [name], FNP  REFERRING DIAG: [phone] (ICD-10-CM) - Fall, subsequent encounter  THERAPY DIAG:  Muscle weakness (generalized)  Difficulty in walking, not elsewhere classified  Other abnormalities of gait and mobility  Rationale for Evaluation and Treatment Rehabilitation  ONSET DATE: 06/18/22  SUBJECTIVE:   SUBJECTIVE STATEMENT: Pt reports her fall initially occurred in the evening when her husband was not home. Pt got up and had her Rolator and she walked toward her hallway. When she turned around to turn the light on she went completely down on her R leg and broke her femur distally and twisted her knee. When  EMT arrived she had to straighten her LE to immobilize it. Pt went to [facility] hospital for the trauma.   Upon arrival she was evaluated and had to get MRI and imaging. Progressed to 06/19/22. Pt had surgery that night. Pt had to have plate and screws in her knee and rod in her femur. Pt was in hospital for 11 days then progressed to [facility]. Pt [facility] rough time at [facility].   Upon coming home with [facility] pt came home with catheter. Pt came home and was relieved. Pt helps with everything doing "everything" for her except she helps with cooking.   Pt considered home health physical therapy but the cost of care would be too high ( $125 per visit) and this.   Prior to injury pt was ambulating with rolator. Pt used bedside commode at that time. Pt reports 6 falls during this year. Pt reports 2-3 falls with rolator and 2-3 falls walking between bathroom and bedroom and 1 fall in bedroom.   Pt had another fall at [place] where she broke her left humerus about 2  years ago.     PERTINENT HISTORY: Pt reports her fall initially occurred in the evening when her husband was not home. Pt got up and had her Rolator and she walked toward her hallway. When she turned around to turn the light on she went completely down on her R leg and broke her femur distally and twisted her knee. When EMT arrived she had to straighten her LE to immobilize it. Pt went to [facility] hospital for the trauma.   Upon arrival she was evaluated and had to get MRI and imaging. Progressed to 06/19/22. Pt had surgery that night. Pt had to have plate and screws in her knee and rod in her femur. Pt was in hospital for 11 days then progressed to [facility] for 30 days. Pt had rough time at [facility].   Upon coming home with [facility] pt came home with catheter. Pt came home and was relieved. Pt helps with everything doing "everything" for her except she helps with cooking.   Pt considered home  health physical therapy but the cost of care would be too high ( $125 per visit) and this.   Prior to injury pt was ambulating with rolator. Pt used bedside commode at that time. Pt reports 6 falls during this year. Pt reports 2-3 falls with rolator and 2-3 falls walking between bathroom and bedroom and 1 fall in bedroom.   Pt had another fall at [facility] where she broke her left humerus about 2 years ago.   PAIN:  Are you having pain? No  PRECAUTIONS: Other: WBAT  WEIGHT BEARING RESTRICTIONS     Gardenia Phlegm, MD MPH - 09/03/2022 3:15 PM EDT   Formatting of this note might be different from the original. At this time, you can continue weightbearing as tolerated to right lower extremity. Please continue working on range of motion and strengthening. We have given you prescription for physical therapy.   FALLS:  Has patient fallen in last 6 months? Yes. 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 Pt does not know if she will be able to sit in one position for that long. Not sure if it will be feasible to get back to work.   PLOF: Requires assistive device for independence, Needs assistance with ADLs, Needs assistance with homemaking, and Needs assistance with gait  PATIENT GOALS Pt would like to get legs moving a little bit more. Pt wants to be able to get to bathroom on her own.  Get upper body strength improved.     OBJECTIVE:   DIAGNOSTIC FINDINGS:  From 9/14 visit with Flonnie Hailstone, MD, 09/03/2022 3:36 PM EDT   Healing internally fixed distal femoral fracture without hardware complication or change in alignment.    PATIENT SURVEYS:  FOTO 19 risk adjusted goal of 49%  COGNITION:  Overall cognitive status: Within functional limits for tasks assessed     SENSATION: Impaired, not formally tested this date     POSTURE: rounded shoulders and forward head  PALPATION: B LE felt cool to the touch.   LOWER EXTREMITY ROM: Limited AROM on the left in seated position,  has initiation of hip abduction and adduction but unabel to lift L LE into hip flexion or knee extension against gravity   LOWER EXTREMITY MMT:  MMT Right eval Left eval  Hip flexion 3 2  Hip extension    Hip abduction 3+ 2+  Hip adduction 3+ 2  Hip internal rotation 3   Hip external rotation 3   Knee flexion 3+ 1+  Knee extension 3+ 1  Ankle dorsiflexion 3 0  Ankle plantarflexion 3+ 2  Ankle inversion    Ankle eversion     (Blank rows = not tested)  LOWER EXTREMITY SPECIAL TESTS:  N/a  FUNCTIONAL TESTS: Consider function in sitting test at next visit.  Patient unable to perform standing functional test this date secondary to significant left lower extremity weakness and risk of falling.   GAIT: Distance walked: Pt unable to ambulate at this time.      TODAY'S TREATMENT: Access Code: [email or web address] URL: [email or web address] Date: 09/28/2022 Prepared by: Rivka Barbara  Exercises - Seated March  - 1 x daily - 7 x weekly - 2 sets - 10 reps - Seated Long Arc Quad  - 1 x daily - 7 x weekly - 2 sets - 10 reps - Seated Heel Raise  - 1 x daily - 7 x weekly - 2 sets - 20 reps - Seated hip abduction with band  - 1 x daily - 7 x weekly - 2 sets - 10 reps - Seated Hip Adduction Isometrics with Ball  - 1 x daily - 7 x weekly - 2 sets - 10 reps   Attempted left lower extremity long arc quad as well as her lower extremity dorsiflexion but these were not feasible for performance at home and patient required min assist for active assisted range of motion was able to perform some minimal movement of her joints in  this range.  PATIENT EDUCATION:  Education details: HEP, POC Person educated: Patient Education method: Explanation Education comprehension: verbalized understanding   HOME EXERCISE PROGRAM: Access Code: [email or web address] URL: [email or web address] Date: 09/28/2022 Prepared by: Rivka Barbara  Exercises - Seated March  - 1 x daily - 7 x weekly - 2 sets - 10 reps - Seated Long Arc Quad  - 1 x daily - 7 x weekly - 2 sets - 10 reps - Seated Heel Raise  - 1 x daily - 7 x weekly - 2 sets - 20 reps - Seated hip abduction with band  - 1 x daily - 7 x weekly - 2 sets - 10 reps - Seated Hip Adduction Isometrics with Ball  - 1 x daily - 7 x weekly - 2 sets - 10 reps  ASSESSMENT:  CLINICAL IMPRESSION: Patient is a 56y.o. female who was seen today for physical therapy evaluation and treatment for right hip fracture and IM nail fixation on 06-19-2022.  Patient currently unable to stand independently and requires slide board transfers to and from wheelchair with assistance.  Following discharge from hospital patient stayed in nursing home where she received intermittent physical therapy.  Upon discharge from this patient has not received therapy but has been trying to perform some lower extremity muscle activation within her home.  Patient presents with significant weakness in her lower extremities bilaterally with greater weakness on the left side compared to the right.  Patient is currently unable to perform transfers on her own and requires assistance as well as a slide board to transfer to and from her bed to wheelchair.  Patient also  requires significant assistance with car transfers.  Patient has had many falls in the last 6 months likely due to lower extremity weakness as well as polyneuropathy.  Patient will benefit from skilled physical therapy interventions in order to improve her lower extremity strength, improve her overall mobility, decrease her risk of falls, improve her ability to  perform ADLs independently, and in order to improve her quality of life.   OBJECTIVE IMPAIRMENTS Abnormal gait, decreased activity tolerance, decreased balance, decreased endurance, decreased mobility, difficulty walking, decreased ROM, decreased strength, impaired perceived functional ability, and impaired sensation.   ACTIVITY LIMITATIONS carrying, lifting, bending, standing, squatting, stairs, transfers, bed mobility, bathing, toileting, dressing, self feeding, hygiene/grooming, and locomotion level  PARTICIPATION LIMITATIONS: meal prep, cleaning, laundry, driving, shopping, community activity, occupation, and yard work  PERSONAL FACTORS 3+ comorbidities: Polyneuropathy, hypertension, hyperlipidemia, diabetes mellitus, arthritis  are also affecting patient's functional outcome.   REHAB POTENTIAL: Fair secondary to comorbidities above particularly polyneuropathy affecting her lower extremity strength and muscle activation  CLINICAL DECISION MAKING: Evolving/moderate complexity  EVALUATION COMPLEXITY: Moderate   GOALS: Goals reviewed with patient? Yes  SHORT TERM GOALS: Target date: 10/26/2022     Patient will be independent in home exercise program to improve strength/mobility for better functional independence with ADLs. Baseline: No HEP currently  Goal status: INITIAL   LONG TERM GOALS: Target date: 12/21/2022   Patient will be modified independent for slide board transfers in order to improve her ability to transfer to and from wheelchair as well as bedside commode for improvement in ADLs. Baseline: Patient currently requires assistance with slide board transfers is unable to utilize bedside commode and requires use of catheter and bedpan Goal status: INITIAL  2.  Patient will perform sit to and from stand with min assist from physical therapist in order to indicate improved lower extremity functional strength and improved ability to perform functional transfers and functional  leg activities within her home. Baseline: Patient unable to stand independently at this time due to lower extremity weakness Goal status: INITIAL  3.  Patient will improve focus on therapeutic outcome score to 35 or greater in order to indicate improved perceived functional ability Baseline: 19 Goal status: INITIAL  4.  Patient will perform stand pivot transfer from wheelchair to mat table with moderate assistance or less in order to improve her ability to perform transfers within the home to improve her overall functional capacity. Baseline:  Goal status: INITIAL     PLAN: PT FREQUENCY: 2x/week  PT DURATION: 12 weeks  PLANNED INTERVENTIONS: Therapeutic exercises, Therapeutic activity, Neuromuscular re-education, Balance training, Gait training, Patient/Family education, Self Care, Joint mobilization, Stair training, DME instructions, and Electrical stimulation  PLAN FOR NEXT SESSION: Perform slide board transfer in order to transition to mat table for potential for function and sitting test.  Could be beneficial to have sit to stand or another therapist for assistance depending on patient's safety with this.   Particia Lather, PT 09/28/2022, 5:26 PM

## 2022-09-29 ENCOUNTER — Telehealth: Payer: BLUE CROSS/BLUE SHIELD

## 2022-10-05 ENCOUNTER — Ambulatory Visit: Payer: BLUE CROSS/BLUE SHIELD | Admitting: Physical Therapy

## 2022-10-05 DIAGNOSIS — M6281 Muscle weakness (generalized): Secondary | ICD-10-CM

## 2022-10-05 DIAGNOSIS — R262 Difficulty in walking, not elsewhere classified: Secondary | ICD-10-CM

## 2022-10-05 DIAGNOSIS — R2689 Other abnormalities of gait and mobility: Secondary | ICD-10-CM

## 2022-10-05 NOTE — Therapy (Signed)
OUTPATIENT PHYSICAL THERAPY LOWER EXTREMITY treatment    Patient Name: Gabriella Marsh MRN: [id number] DOB:10/03/1966, 56 y.o., female Today's Date: 10/06/2022   PT End of Session - 10/05/22 1609     Visit Number 2    Number of Visits 24    Date for PT Re-Evaluation 12/21/22    Authorization Type 20 visits per calandar year    Progress Note Due on Visit 10    PT Start Time 1603    PT Stop Time 1644    PT Time Calculation (min) 41 min    Equipment Utilized During Treatment Gait belt    Activity Tolerance Patient tolerated treatment well    Behavior During Therapy WFL for tasks assessed/performed              Past Medical History:  Diagnosis Date   Arthritis    knees   Blood clotting disorder ([place])    on toe    Degenerative disc disease, lumbar    Diabetes mellitus without complication (HCC)    GERD (gastroesophageal reflux disease)    Headache    daily - AM (has not been able to have SPG blocks lately)   Hyperlipidemia    Hypertension    Neuropathy    feet   Vertigo    3-4x/yr   Past Surgical History:  Procedure Laterality Date   ABDOMINAL HYSTERECTOMY     But still has cervix   AMPUTATION TOE Right 07/24/2021   Procedure: AMPUTATION TOE;  Surgeon: Sharlotte Alamo, DPM;  Location: [facility] ORS;  Service: Podiatry;  Laterality: Right;   CESAREAN SECTION     CHOLECYSTECTOMY     COLONOSCOPY WITH PROPOFOL N/A 02/20/2022   Procedure: COLONOSCOPY WITH PROPOFOL;  Surgeon: [name], MD;  Location: [facility] ENDOSCOPY;  Service: Gastroenterology;  Laterality: N/A;   left arm frature  10/12/2020   OVARIAN CYST SURGERY     PARATHYROIDECTOMY  03/28/2021   Procedure: PARATHYROIDECTOMY AUTOTRANSPLANT;  Surgeon: Fredirick Maudlin, MD;  Location: [facility] ORS;  Service: General;;   right femur surgery Right    SHOULDER SURGERY Left 12/26/014   Dr. Little Ishikawa, [place] N/A 03/28/2021   Procedure: THYROIDECTOMY, total;  Surgeon: Fredirick Maudlin, MD;  Location: [facility]  ORS;  Service: General;  Laterality: N/A;  Provider requesting 3 hours /180 minutes for procedure   TONSILLECTOMY AND ADENOIDECTOMY     UTERINE FIBROID SURGERY     Patient Active Problem List   Diagnosis Date Noted   Need for influenza vaccination 09/10/2022   Primary insomnia 09/10/2022   Impaired functional mobility, balance, gait, and endurance 06/09/2022   Type 2 diabetes mellitus with diabetic neuropathy, without long-term current use of insulin ([place]) 04/24/2022   Acquired hypothyroidism 04/24/2022   Hyperlipidemia associated with type 2 diabetes mellitus ([place]) 04/24/2022   Fall 04/24/2022   Dizziness 04/24/2022   Iron deficiency anemia 04/24/2022   Colon cancer screening 01/26/2022   Encounter for screening mammogram for malignant neoplasm of breast 01/26/2022   Chronic sensorimotor polyneuropathy with axonal and demyelinating features 01/12/2022   Osteoarthritis of knees (Bilateral) 01/12/2022   Tricompartment osteoarthritis of knees (Bilateral) 01/12/2022    Grade 1 Retrolisthesis (61m) of L5 over S1 01/12/2022   History of proximal humerus fracture (Left) 01/12/2022   Vitamin D deficiency 01/12/2022   Elevated hemoglobin A1c 01/12/2022   Hypochloremia 01/12/2022   Hyperglycemia 01/12/2022   Chronic peripheral neuropathic pain 01/12/2022   Type 2 diabetes mellitus with peripheral neuropathy ([place] 01/12/2022   Glands  swollen 01/09/2022   Acute cough 01/09/2022   Acute non-recurrent pansinusitis 01/09/2022   Rhinorrhea 01/09/2022   Left acute otitis media 01/09/2022   Pharmacologic therapy 11/12/2021   Disorder of skeletal system 11/12/2021   Problems influencing health status 11/12/2021   Chronic hand pain (1ry area of Pain) (Bilateral) (L>R) 11/12/2021   Chronic feet pain (3ry area of Pain) (Bilateral) (L>R) 11/12/2021   Chronic shoulder pain (4th area of Pain) (Left) 11/12/2021   Chronic knee pain (5th area of Pain) (Bilateral) (L>R) 11/12/2021   Lumbar facet syndrome  (Bilateral) 11/12/2021   Muscle spasm 10/22/2021   Type 2 diabetes mellitus with microalbuminuria, without long-term current use of insulin ([place]) 10/22/2021   Diabetic foot infection ([place]) 07/22/2021   Depression with anxiety 07/22/2021   Sepsis ([place]) 07/22/2021   COVID-19 virus infection 07/22/2021   Hypocalcemia 07/17/2021   Chronic pain syndrome 07/17/2021   S/P total thyroidectomy 03/28/2021   Thyroid nodule    Blue toe syndrome of left lower extremity ([place]) 10/01/2020   Schamberg's purpura 10/09/2019   Leg swelling 10/09/2019   Benign positional vertigo 02/03/2016   Common migraine with intractable migraine 06/18/2015   Anxiety 05/04/2015   Adaptation reaction 05/03/2015   DDD (degenerative disc disease), lumbosacral 05/03/2015   Hypercholesteremia 05/03/2015   Insomnia due to medical condition 05/03/2015   Adiposity 05/03/2015   Disorder of peripheral nervous system 05/03/2015   Snores 05/03/2015   Diabetic peripheral neuropathy ([place]) 03/06/2015   Essential hypertension 03/06/2015   Elevated liver function tests 03/06/2015   Obesity 03/06/2015   GERD (gastroesophageal reflux disease) 03/06/2015   Multinodular goiter 03/06/2015   RLS (restless legs syndrome) 03/06/2015   Chronic low back pain (2ry area of Pain) (Bilateral) (L>R) w/o sciatica 03/06/2015   Leg cramps 03/06/2015    PCP: [name], FNP  REFERRING PROVIDER: [name], FNP  REFERRING DIAG: [phone] (ICD-10-CM) - Fall, subsequent encounter  THERAPY DIAG:  Muscle weakness (generalized)  Difficulty in walking, not elsewhere classified  Other abnormalities of gait and mobility  Rationale for Evaluation and Treatment Rehabilitation  ONSET DATE: 06/18/22  SUBJECTIVE:   SUBJECTIVE STATEMENT: Pt reports she has been working hard on her exercises since last session.Pt presents in wheelchair with slideboard for transfer and with catheter donned under clothing on her L LE.     PERTINENT  HISTORY: Pt reports her fall initially occurred in the evening when her husband was not home. Pt got up and had her Rolator and she walked toward her hallway. When she turned around to turn the light on she went completely down on her R leg and broke her femur distally and twisted her knee. When EMT arrived she had to straighten her LE to immobilize it. Pt went to [facility] for the trauma.   Upon arrival she was evaluated and had to get MRI and imaging. Progressed to 06/19/22. Pt had surgery that night. Pt had to have plate and screws in her knee and rod in her femur. Pt was in hospital for 11 days then progressed to [facility] for [facility] days. [facility] rough [facility] at [facility].   Upon coming home with [facility] pt came home with catheter. Pt came home and was relieved. Pt husband helps with everything doing "everything" for her except she helps with cooking.   Pt considered home health physical therapy but the cost of care would be too high ( $125 per visit) and this.   Prior  to injury pt was ambulating with rolator. Pt used bedside commode at that time. Pt reports 6 falls during this year. Pt reports 2-3 falls with rolator and 2-3 falls walking between bathroom and bedroom and 1 fall in bedroom.   Pt had another fall at [place] where she broke her left humerus about 2 years ago.   PAIN:  Are you having pain? No  PRECAUTIONS: Other: WBAT  WEIGHT BEARING RESTRICTIONS     Gardenia Phlegm, MD MPH - 09/03/2022 3:15 PM EDT   Formatting of this note might be different from the original. At this time, you can continue weightbearing as tolerated to right lower extremity. Please continue working on range of motion and strengthening. We have given you prescription for physical therapy.   FALLS:  Has patient fallen in last 6 months? Yes. 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 Pt does not know if she will be able to sit in one position for that long. Not sure if it will be feasible to get back to work.   PLOF: Requires assistive device for independence, Needs assistance with ADLs, Needs assistance with homemaking, and Needs assistance with gait  PATIENT GOALS Pt would like to get legs moving a little bit more. Pt wants to be able to get to bathroom on her own.  Get upper body strength improved.     OBJECTIVE:   DIAGNOSTIC FINDINGS:  From 9/14 visit with Flonnie Hailstone, MD, 09/03/2022 3:36 PM EDT   Healing internally fixed distal femoral fracture without hardware complication or change in alignment.   PATIENT SURVEYS:  FOTO 19 risk adjusted goal of 49%  COGNITION:  Overall cognitive status: Within functional limits for tasks assessed     SENSATION: Impaired, not formally tested this date     POSTURE: rounded shoulders and forward head  PALPATION: B LE felt cool to the touch.   LOWER EXTREMITY ROM: Limited AROM on the left in seated position, has initiation of hip abduction and adduction but unabel to lift L LE into hip flexion or knee extension against gravity   LOWER EXTREMITY MMT:  MMT Right eval Left eval  Hip flexion 3 2  Hip extension    Hip abduction 3+ 2+  Hip adduction 3+ 2  Hip internal rotation 3   Hip external rotation 3   Knee flexion 3+ 1+  Knee extension 3+ 1  Ankle dorsiflexion 3 0  Ankle plantarflexion 3+ 2  Ankle inversion    Ankle eversion     (Blank rows = not tested)  LOWER EXTREMITY SPECIAL TESTS:  N/a  FUNCTIONAL TESTS: FIST completed 10/16: pt scores 42/56   GAIT: Distance walked: Pt unable to ambulate at this time.      TODAY'S  TREATMENT:   Slide board transfer: pt requires set up assist for WC positioning, and slide board placement.   - Min A for LE placement and for safety and CGA providedthroughout.   Pt does have some c/o of impaired bed mobility which may be evaluated and targeted for intervention in futire ( husband assisting with bed mobility.   Function in sitting test ( FIST)  81/56  LAQ 1.5# AW on R AROm, PROM on L Heel raises with balls of feet on 1/2 foam 2 x 10 B LE with 1.5# AW  Marching with 1.5# AW on R and PROM/ AAROM on L   PATIENT EDUCATION:  Education details: HEP, POC Person educated: Patient Education method: Explanation Education comprehension: verbalized understanding   HOME EXERCISE PROGRAM: Access Code: [email or web address] URL: [email or web address] Date: 09/28/2022 Prepared by: Rivka Barbara  Exercises - Seated March  - 1 x daily - 7 x weekly - 2 sets - 10 reps - Seated Long Arc Quad  - 1 x daily - 7 x weekly - 2 sets - 10 reps - Seated Heel Raise  - 1 x daily - 7 x weekly - 2 sets - 20 reps - Seated hip abduction with band  - 1 x daily - 7 x weekly - 2 sets - 10 reps - Seated Hip Adduction Isometrics with Ball  - 1 x daily - 7 x weekly - 2 sets - 10 reps  ASSESSMENT:  CLINICAL IMPRESSION: Patient is a 56 y.o. female who was seen today for physical therapy treatment for right hip fracture and IM nail fixation on 06-19-2022.  Patient currently unable to stand independently and requires slide board transfers to and from wheelchair with assistance.  Tested pt ability to utilize slide board for transfers and pt requires set up assist for positioning and min assist for safe completion of transfer. Pt continues to demonstrate significant weakness in her LE's bilaterally, particularly her L LE. Pt provided signed order from MD for L AFO and instructed in how to make appointment for evaluation and fitting for proper L LE orhtotic.  Patient will benefit from skilled physical therapy  interventions in order to improve her lower extremity strength, improve her overall mobility, decrease her risk of falls, improve her ability to perform ADLs independently, and in order to improve her quality of life.   OBJECTIVE IMPAIRMENTS Abnormal gait, decreased activity tolerance, decreased balance, decreased endurance, decreased mobility, difficulty walking, decreased ROM, decreased strength, impaired perceived functional ability, and impaired sensation.   ACTIVITY LIMITATIONS carrying, lifting, bending, standing, squatting, stairs, transfers, bed mobility, bathing, toileting, dressing, self feeding, hygiene/grooming, and locomotion level  PARTICIPATION LIMITATIONS: meal prep, cleaning, laundry, driving, shopping, community activity, occupation, and yard work  PERSONAL FACTORS 3+ comorbidities: Polyneuropathy, hypertension, hyperlipidemia, diabetes mellitus, arthritis  are also affecting patient's functional outcome.   REHAB POTENTIAL: Fair secondary to comorbidities above particularly polyneuropathy affecting her lower extremity strength and muscle activation  CLINICAL DECISION MAKING: Evolving/moderate complexity  EVALUATION COMPLEXITY: Moderate   GOALS: Goals reviewed with patient? Yes  SHORT TERM GOALS: Target date: 10/26/2022     Patient will be independent in home exercise program to improve strength/mobility for better functional independence with ADLs. Baseline: No HEP currently  Goal status: INITIAL   LONG TERM GOALS: Target date: 12/29/2022   Patient will be modified independent for slide board transfers in order to improve her ability to transfer to and from wheelchair as well as bedside commode for improvement in ADLs. Baseline: Patient currently requires assistance with slide board transfers is unable to utilize bedside commode and requires use of catheter and bedpan Goal status: INITIAL  2.  Patient will perform sit to and from stand with min assist from physical  therapist in order to indicate improved lower extremity functional strength and improved ability to perform functional transfers and functional leg activities within her home. Baseline: Patient unable to stand independently  at this time due to lower extremity weakness Goal status: INITIAL  3.  Patient will improve focus on therapeutic outcome score to 35 or greater in order to indicate improved perceived functional ability Baseline: 19 Goal status: INITIAL  4.  Patient will perform stand pivot transfer from wheelchair to mat table with moderate assistance or less in order to improve her ability to perform transfers within the home to improve her overall functional capacity. Baseline:  Goal status: INITIAL     PLAN: PT FREQUENCY: 2x/week  PT DURATION: 12 weeks  PLANNED INTERVENTIONS: Therapeutic exercises, Therapeutic activity, Neuromuscular re-education, Balance training, Gait training, Patient/Family education, Self Care, Joint mobilization, Stair training, DME instructions, and Electrical stimulation  PLAN FOR NEXT SESSION: interventions to improve slide board transfer efficacy independently as well as intervention to improve strength and efficacy for future completion of stand pivot transfers.    Particia Lather, PT 10/06/2022, 8:17 AM

## 2022-10-06 ENCOUNTER — Encounter: Payer: Self-pay | Admitting: Student in an Organized Health Care Education/Training Program

## 2022-10-06 ENCOUNTER — Ambulatory Visit
Payer: BLUE CROSS/BLUE SHIELD | Attending: Student in an Organized Health Care Education/Training Program | Admitting: Student in an Organized Health Care Education/Training Program

## 2022-10-06 ENCOUNTER — Encounter: Payer: Self-pay | Admitting: Physical Therapy

## 2022-10-06 VITALS — BP 106/71 | HR 104 | Temp 97.2°F | Resp 18 | Ht 70.0 in | Wt 215.0 lb

## 2022-10-06 DIAGNOSIS — M170 Bilateral primary osteoarthritis of knee: Secondary | ICD-10-CM | POA: Diagnosis not present

## 2022-10-06 DIAGNOSIS — M79642 Pain in left hand: Secondary | ICD-10-CM | POA: Insufficient documentation

## 2022-10-06 DIAGNOSIS — Z79891 Long term (current) use of opiate analgesic: Secondary | ICD-10-CM | POA: Diagnosis present

## 2022-10-06 DIAGNOSIS — G608 Other hereditary and idiopathic neuropathies: Secondary | ICD-10-CM

## 2022-10-06 DIAGNOSIS — Z79899 Other long term (current) drug therapy: Secondary | ICD-10-CM

## 2022-10-06 DIAGNOSIS — M79671 Pain in right foot: Secondary | ICD-10-CM | POA: Diagnosis present

## 2022-10-06 DIAGNOSIS — M79672 Pain in left foot: Secondary | ICD-10-CM | POA: Insufficient documentation

## 2022-10-06 DIAGNOSIS — M25561 Pain in right knee: Secondary | ICD-10-CM | POA: Diagnosis present

## 2022-10-06 DIAGNOSIS — G64 Other disorders of peripheral nervous system: Secondary | ICD-10-CM | POA: Diagnosis present

## 2022-10-06 DIAGNOSIS — M79641 Pain in right hand: Secondary | ICD-10-CM

## 2022-10-06 DIAGNOSIS — E1142 Type 2 diabetes mellitus with diabetic polyneuropathy: Secondary | ICD-10-CM

## 2022-10-06 DIAGNOSIS — M47816 Spondylosis without myelopathy or radiculopathy, lumbar region: Secondary | ICD-10-CM | POA: Diagnosis not present

## 2022-10-06 DIAGNOSIS — M4310 Spondylolisthesis, site unspecified: Secondary | ICD-10-CM

## 2022-10-06 DIAGNOSIS — M25562 Pain in left knee: Secondary | ICD-10-CM | POA: Diagnosis present

## 2022-10-06 DIAGNOSIS — M545 Low back pain, unspecified: Secondary | ICD-10-CM | POA: Diagnosis present

## 2022-10-06 DIAGNOSIS — Z8781 Personal history of (healed) traumatic fracture: Secondary | ICD-10-CM | POA: Diagnosis not present

## 2022-10-06 DIAGNOSIS — M25512 Pain in left shoulder: Secondary | ICD-10-CM | POA: Insufficient documentation

## 2022-10-06 DIAGNOSIS — M792 Neuralgia and neuritis, unspecified: Secondary | ICD-10-CM | POA: Diagnosis not present

## 2022-10-06 DIAGNOSIS — G894 Chronic pain syndrome: Secondary | ICD-10-CM | POA: Diagnosis present

## 2022-10-06 DIAGNOSIS — G8929 Other chronic pain: Secondary | ICD-10-CM

## 2022-10-06 MED ORDER — OXYCODONE-ACETAMINOPHEN 5-325 MG PO TABS: 1.0000 | ORAL_TABLET | Freq: Two times a day (BID) | ORAL | 0 refills | Status: DC | PRN

## 2022-10-06 MED ORDER — OXYCODONE-ACETAMINOPHEN 5-325 MG PO TABS: 1.0000 | ORAL_TABLET | Freq: Three times a day (TID) | ORAL | 0 refills | Status: DC | PRN

## 2022-10-06 NOTE — Progress Notes (Signed)
PROVIDER NOTE: Information contained herein reflects review and annotations entered in association with encounter. Interpretation of such information and data should be left to medically-trained personnel. Information provided to patient can be located elsewhere in the medical record under "Patient Instructions". Document created using STT-dictation technology, any transcriptional errors that may result from process are unintentional.    Patient: Gabriella Marsh  Service Category: E/M  Provider: Gillis Santa, MD  DOB: 07/15/1966  DOS: 10/06/2022  Referring Provider: [name], FNP  MRN: [id number]  Specialty: Interventional Pain Management  PCP: [name], FNP  Type: Established Patient  Setting: Ambulatory outpatient    Location: Office  Delivery: Face-to-face     HPI  [name], a 56 y.o. year old female, is here today because of her Diabetic peripheral neuropathy ([place]) [E11.42]. [name] primary complain today is 56 Leg Pain (Bilateral, left is worse) Last encounter: My last encounter with her was on 07/16/22 Pertinent problems: Ms. Zale has Diabetic peripheral neuropathy ([place]); RLS (restless legs syndrome); Chronic low back pain (2ry area of Pain) (Bilateral) (L>R) w/o sciatica; DDD (degenerative disc disease), lumbosacral; Disorder of peripheral nervous system; Chronic pain syndrome; Pharmacologic therapy; Disorder of skeletal system; Problems influencing health status; Chronic hand pain (1ry area of Pain) (Bilateral) (L>R); Chronic feet pain (3ry area of Pain) (Bilateral) (L>R); Chronic shoulder pain (4th area of Pain) (Left); Chronic knee pain (5th area of Pain) (Bilateral) (L>R); Lumbar facet syndrome (Bilateral); Chronic sensorimotor polyneuropathy with axonal and demyelinating features; Osteoarthritis of knees (Bilateral); Tricompartment osteoarthritis of knees (Bilateral);  Grade 1 Retrolisthesis (36m) of L5 over S1; Chronic peripheral neuropathic pain; and  Type 2 diabetes mellitus with peripheral neuropathy (HCC) on their pertinent problem list. Pain Assessment: Severity of Chronic pain is reported as a 3 /10. Location: Leg Right, Left/radiates from thighs to knees. Onset: More than a month ago. Quality: Dull, Throbbing. Timing: Intermittent. Modifying factor(s): lying down flat on stomach. Vitals:  height is _0  (1.778 m) and weight is 215 lb (97.5 kg). Her temperature is 97.2 F (36.2 C) (abnormal). Her blood pressure is 106/71 and her pulse is 104 (abnormal). Her respiration is 18 and oxygen saturation is 97%.   Reason for encounter: medication management.   Dyanara follows up today for medication management.  She is working with physical therapy.  She has no weight restrictions for her right leg.  She is supposed to see orthopedic surgeon back in 6 weeks with follow-up right femur x-ray. Has been home for 3 months from her SNF Is frustrated that [facility] not cover repeat Qutenza even though prior treatment was very successful. She has tried to appeal the decision without any success. Refill of Percocet as below.  07/16/22 Patient presents today for medication management.  Unfortunately, she sustained a right distal femoral shaft fracture and underwent definitive fixation.  She has been at a skilled nursing facility recovering.  She states that she has been receiving pain medications there.  She states that her A1c at the time of her admission was 13.1.  She had a retrograde nail fixation of the right distal femoral shaft fracture on June 19, 2022.  I will increase her oxycodone to 5 mg every 8 hours as needed, quantity 90/month as she recovers from her acute injury in the context of having chronic pain syndrome with diabetic peripheral neuropathy.  I have emphasized the importance of getting her blood sugars better managed as this will help reduce her risk of falls in  the future as she will have better proprioception.  She has a steady bowel regimen.   She continues multimodal analgesics as below.  Follow-up in 8 weeks to reassess her progress.  Pharmacotherapy Assessment  Analgesic: Percocet 5 mg TID as needed, quantity 90/month   Monitoring: [place] PMP: PDMP reviewed during this encounter.       Pharmacotherapy: No side-effects or adverse reactions reported. Compliance: No problems identified. Effectiveness: Clinically acceptable.  Dewayne Shorter, RN  10/06/2022  2:27 PM  Sign when Signing Visit Nursing Pain Medication Assessment:  Safety precautions to be maintained throughout the outpatient stay will include: orient to surroundings, keep bed in low position, maintain call bell within reach at all times, provide assistance with transfer out of bed and ambulation.  Medication Inspection Compliance: Pill count conducted under aseptic conditions, in front of the patient. Neither the pills nor the bottle was removed from the patient's sight at any time. Once count was completed pills were immediately returned to the patient in their original bottle.  Medication: Oxycodone/APAP Pill/Patch Count:  15 of 90 pills remain Pill/Patch Appearance: Markings consistent with prescribed medication Bottle Appearance: Standard pharmacy container. Clearly labeled. Filled Date: 09 / 13 / 2023 Last Medication intake:  Today    UDS:  Summary  Date Value Ref Range Status  11/12/2021 Note  Final    Comment:    ==================================================================== Compliance Drug Analysis, Ur ==================================================================== Test                             Result       Flag       Units  Drug Present and Declared for Prescription Verification   Oxycodone                      1136         EXPECTED   ng/mg creat   Oxymorphone                    1753         EXPECTED   ng/mg creat   Noroxycodone                   1215         EXPECTED   ng/mg creat   Noroxymorphone                 779          EXPECTED    ng/mg creat    Sources of oxycodone are scheduled prescription medications.    Oxymorphone, noroxycodone, and noroxymorphone are expected    metabolites of oxycodone. Oxymorphone is also available as a    scheduled prescription medication.    Gabapentin                     PRESENT      EXPECTED   Cyclobenzaprine                PRESENT      EXPECTED   Desmethylcyclobenzaprine       PRESENT      EXPECTED    Desmethylcyclobenzaprine is an expected metabolite of    cyclobenzaprine.    Nortriptyline                  PRESENT      EXPECTED    Nortriptyline may be administered as a prescription drug;  it is also    an expected metabolite of amitriptyline.    Trazodone                      PRESENT      EXPECTED   1,3 chlorophenyl piperazine    PRESENT      EXPECTED    1,3-chlorophenyl piperazine is an expected metabolite of trazodone.    Acetaminophen                  PRESENT      EXPECTED ==================================================================== Test                      Result    Flag   Units      Ref Range   Creatinine              165              mg/dL      >=20 ==================================================================== Declared Medications:  The flagging and interpretation on this report are based on the  following declared medications.  Unexpected results may arise from  inaccuracies in the declared medications.   **Note: The testing scope of this panel includes these medications:   Cyclobenzaprine (Flexeril)  Gabapentin (Neurontin)  Nortriptyline (Pamelor)  Oxycodone (Percocet)  Trazodone (Desyrel)   **Note: The testing scope of this panel does not include small to  moderate amounts of these reported medications:   Acetaminophen (Percocet)   **Note: The testing scope of this panel does not include the  following reported medications:   Atorvastatin  Clopidogrel  Dulaglutide (Trulicity)  Glipizide (Glucotrol)  Hydrochlorothiazide  Levothyroxine   Lisinopril (Zestril)  Melatonin  Metformin (Glucophage)  Omeprazole  Triamterene ==================================================================== For clinical consultation, please call [phone]. ====================================================================      ROS  Constitutional: Denies any fever or chills Gastrointestinal: No reported hemesis, hematochezia, vomiting, or acute GI distress Musculoskeletal: right thigh & femur pain Neurological: No reported episodes of acute onset apraxia, aphasia, dysarthria, agnosia, amnesia, paralysis, loss of coordination, or loss of consciousness  Medication Review  Insulin Pen Needle, Melatonin, atorvastatin, clopidogrel, cyclobenzaprine, fluticasone, furosemide, gabapentin, insulin glargine, levothyroxine, lisinopril, metFORMIN, nortriptyline, omeprazole, oxyCODONE-acetaminophen, traZODone, and triamterene-hydrochlorothiazide  History Review  Allergy: [name] is allergic to celecoxib, pregabalin, buspar [buspirone], citalopram, and other. Drug: [name]  reports no history of drug use. Alcohol:  reports no history of alcohol use. Tobacco:  reports that she quit smoking about 10 years ago. Her smoking use included cigarettes. She has never used smokeless tobacco. Social: [name]  reports that she quit smoking about 10 years ago. Her smoking use included cigarettes. She has never used smokeless tobacco. She reports that she does not drink alcohol and does not use drugs. Medical:  has a past medical history of Arthritis, Blood clotting disorder ([place]), Degenerative disc disease, lumbar, Diabetes mellitus without complication ([place]), GERD (gastroesophageal reflux disease), Headache, Hyperlipidemia, Hypertension, Neuropathy, and Vertigo. Surgical: [name]  has a past surgical history that includes Cesarean section; Shoulder surgery (Left, 12/26/014); Abdominal hysterectomy; Tonsillectomy and adenoidectomy; Uterine fibroid  surgery; Ovarian cyst surgery; Cholecystectomy; left arm frature (10/12/2020); Thyroidectomy (N/A, 03/28/2021); Parathyroidectomy (03/28/2021); Amputation toe (Right, 07/24/2021); Colonoscopy with propofol (N/A, 02/20/2022); and right femur surgery (Right). Family: family history includes Congestive Heart Failure in her father; Diabetes in her father; Healthy in her brother; Heart disease in her father; Hyperlipidemia in her father; Hypertension in her father; Irritable bowel syndrome in her mother;  Osteoporosis in her maternal grandmother and mother.  Laboratory Chemistry Profile   Renal Lab Results  Component Value Date   BUN 16 05/27/2022   CREATININE 0.84 05/27/2022   BCR 21 04/24/2022   GFR 111.18 06/05/2015   GFRAA 92 01/03/2021   GFRNONAA >60 05/27/2022    Hepatic Lab Results  Component Value Date   AST 24 05/27/2022   ALT 20 05/27/2022   ALBUMIN 4.0 05/27/2022   ALKPHOS 55 05/27/2022   LIPASE 47 05/04/2015    Electrolytes Lab Results  Component Value Date   NA 134 (L) 05/27/2022   K 3.8 05/27/2022   CL 87 (L) 05/27/2022   CALCIUM 9.4 05/27/2022   MG 1.7 05/27/2022    Bone Lab Results  Component Value Date   25OHVITD1 14 (L) 11/12/2021   25OHVITD2 <1.0 11/12/2021   25OHVITD3 13 11/12/2021    Inflammation (CRP: Acute Phase) (ESR: Chronic Phase) Lab Results  Component Value Date   CRP 4 11/12/2021   ESRSEDRATE 39 11/12/2021   LATICACIDVEN 2.4 ([place]) 07/22/2021         Note: Above Lab results reviewed.  Recent Imaging Review  MR Brain W and Wo Contrast CLINICAL DATA:  Left lower extremity weakness  EXAM: MRI HEAD WITHOUT AND WITH CONTRAST  TECHNIQUE: Multiplanar, multiecho pulse sequences of the brain and surrounding structures were obtained without and with intravenous contrast.  CONTRAST:  77m GADAVIST GADOBUTROL 1 MMOL/ML IV SOLN  COMPARISON:  02/12/2017  FINDINGS: Brain: No acute infarct, mass effect or extra-axial collection. No acute or  chronic hemorrhage. There is multifocal hyperintense T2-weighted signal within the white matter. Parenchymal volume and CSF spaces are normal. The midline structures are normal. There is no abnormal contrast enhancement.  Vascular: Major flow voids are preserved.  Skull and upper cervical spine: Normal calvarium and skull base. Visualized upper cervical spine and soft tissues are normal.  Sinuses/Orbits:No paranasal sinus fluid levels or advanced mucosal thickening. No mastoid or middle ear effusion. Normal orbits.  IMPRESSION: 1. No acute intracranial abnormality. 2. Multifocal hyperintense T2-weighted signal within the white matter, nonspecific and unchanged.  Electronically Signed   By: KUlyses JarredM.D.   On: 05/27/2022 22:31 MR THORACIC SPINE WO CONTRAST CLINICAL DATA:  Mid back pain. Left lower extremity weakness.  EXAM: MRI THORACIC SPINE WITHOUT CONTRAST  TECHNIQUE: Multiplanar, multisequence MR imaging of the thoracic spine was performed. No intravenous contrast was administered.  COMPARISON:  Thoracic spine radiographs 04/02/2020  FINDINGS: Alignment: No significant listhesis is present. Mild leftward curvature is again noted in the upper thoracic spine.  Vertebrae: 2 cm hemangioma is present at T8. 11 mm hemangioma is noted inferiorly at T10. Hemangiomas are present at T12. Marrow signal and vertebral body heights are otherwise normal.  Cord:  Normal signal and morphology.  Paraspinal and other soft tissues: A small right pleural effusion is present. Minimal atelectasis is present. Mediastinum is unremarkable. Paraspinous musculature demonstrates mild atrophy.  Disc levels:  Shallow central disc protrusions are present at C5-6 and T6-7 without significant stenosis. Facet hypertrophy contributes to moderate right and mild left foraminal narrowing at T9-10. Foramina are otherwise patent.  IMPRESSION: 1. Moderate right and mild left foraminal stenosis  at T9-10 secondary to facet hypertrophy. 2. Shallow central disc protrusions at C5-6 and T6-7 without significant stenosis. 3. 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 4. Small right pleural effusion.  Electronically Signed   By: CSan MorelleM.D.   On: 05/27/2022 21:20 MR LUMBAR SPINE WO CONTRAST CLINICAL  DATA:  Low back pain. Cauda equina syndrome suspected.  EXAM: MRI LUMBAR SPINE WITHOUT CONTRAST  TECHNIQUE: Multiplanar, multisequence MR imaging of the lumbar spine was performed. No intravenous contrast was administered.  COMPARISON:  Lumbar spine radiographs 05/27/2022 and 11/12/2021. MRI of the lumbar spine 06/06/2015  FINDINGS: Segmentation: 5 non rib-bearing lumbar type vertebral bodies are present. The lowest fully formed vertebral body is L5.  Alignment: Slight progression of retrolisthesis noted at L5-S1, now measuring 8 mm. No other significant listhesis is present. Straightening of the normal lumbar lordosis is present.  Vertebrae: Remote wedge deformity of L1 is stable. Chronic fatty endplate marrow changes again noted at L5-S1. Marrow signal and vertebral body heights are otherwise normal.  Conus medullaris and cauda equina: Conus extends to the T12-L1 level. Conus and cauda equina appear normal.  Paraspinal and other soft tissues: Limited imaging the abdomen is unremarkable. There is no significant adenopathy. No solid organ lesions are present.  Disc levels:  L1-2: Negative.  L2-3: Negative.  L3-4: Mild facet hypertrophy is present. No significant disc protrusion or stenosis is present.  L4-5: A rightward disc protrusion is present. No significant focal stenosis is present. Mild facet hypertrophy has progressed slightly.  L5-S1: Uncovering of a central disc protrusion demonstrates some progression. Mild to moderate subarticular and foraminal stenosis has progressed, left greater than right.  IMPRESSION: 1. Progressive mild to  moderate subarticular and foraminal stenosis at L5-S1, left greater than right. 2. Slight progression of retrolisthesis at L5-S1. 3. Rightward disc protrusion at L4-5 without significant stenosis. 4. Mild facet hypertrophy at L3-4 without significant disc protrusion or stenosis.  Electronically Signed   By: San Morelle M.D.   On: 05/27/2022 21:15 US Venous Img Lower Unilateral Left CLINICAL DATA:  Left lower extremity swelling.  EXAM: LEFT LOWER EXTREMITY VENOUS DOPPLER ULTRASOUND  TECHNIQUE: Gray-scale sonography with graded compression, as well as color Doppler and duplex ultrasound were performed to evaluate the lower extremity deep venous systems from the level of the common femoral vein and including the common femoral, femoral, profunda femoral, popliteal and calf veins including the posterior tibial, peroneal and gastrocnemius veins when visible. The superficial great saphenous vein was also interrogated. Spectral Doppler was utilized to evaluate flow at rest and with distal augmentation maneuvers in the common femoral, femoral and popliteal veins.  COMPARISON:  None Available.  FINDINGS: Contralateral Common Femoral Vein: Respiratory phasicity is normal and symmetric with the symptomatic side. No evidence of thrombus. Normal compressibility.  Common Femoral Vein: No evidence of thrombus. Normal compressibility, respiratory phasicity and response to augmentation.  Saphenofemoral Junction: No evidence of thrombus. Normal compressibility and flow on color Doppler imaging.  Profunda Femoral Vein: No evidence of thrombus. Normal compressibility and flow on color Doppler imaging.  Femoral Vein: No evidence of thrombus. Normal compressibility, respiratory phasicity and response to augmentation.  Popliteal Vein: No evidence of thrombus. Normal compressibility, respiratory phasicity and response to augmentation.  Calf Veins: No evidence of thrombus. Normal  compressibility and flow on color Doppler imaging.  Other Findings:  None.  IMPRESSION: Negative for deep venous thrombosis in left lower extremity.  Electronically Signed   By: Markus Daft M.D.   On: 05/27/2022 17:16 DG Pelvis 1-2 Views CLINICAL DATA:  Left leg pain, multiple falls  EXAM: PELVIS - 1-2 VIEW  COMPARISON:  None Available.  FINDINGS: There is no evidence of pelvic fracture or diastasis. No pelvic bone lesions are seen.  IMPRESSION: Negative.  Electronically Signed   By: Keturah Barre  Vernard Gambles M.D.   On: 05/27/2022 16:56 DG FEMUR MIN 2 VIEWS LEFT CLINICAL DATA:  Pain, multiple falls  EXAM: LEFT FEMUR 2 VIEWS  COMPARISON:  05/20/2022 and previous  FINDINGS: There is no evidence of fracture or other focal bone lesions. Early marginal spurs involving the lateral femoral condyle, medial tibial plateau, and patellar articular surface. No joint effusion. Soft tissues are unremarkable.  IMPRESSION: 1. No acute findings. 2. Early tricompartment knee DJD  Electronically Signed   By: Lucrezia Europe M.D.   On: 05/27/2022 16:56 DG Lumbar Spine 2-3 Views CLINICAL DATA:  [place].  Back pain.  EXAM: LUMBAR SPINE - 2-3 VIEW  COMPARISON:  11/12/2021  FINDINGS: Normal alignment of the lumbar vertebral bodies. There are mild stable compression deformities but no acute compression fracture. The facets are normally aligned. Stable moderate degenerative disc disease at L5-S1. The visualized bony pelvis is intact. The lower left ribs are intact. Stable age advanced vascular calcifications.  IMPRESSION: 1. Stable mild compression deformities. 2. No acute bony findings. 3. Stable moderate degenerative disc disease at L5-S1.  Electronically Signed   By: Marijo Sanes M.D.   On: 05/27/2022 14:23 CT Head Wo Contrast CLINICAL DATA:  Head trauma, focal neuro findings (Age 22-64y)  EXAM: CT HEAD WITHOUT CONTRAST  TECHNIQUE: Contiguous axial images were obtained from the base  of the skull through the vertex without intravenous contrast.  RADIATION DOSE REDUCTION: This exam was performed according to the departmental dose-optimization program which includes automated exposure control, adjustment of the mA and/or kV according to patient size and/or use of iterative reconstruction technique.  COMPARISON:  CT 05/04/2015.  FINDINGS: Brain: No evidence of acute intracranial hemorrhage or extra-axial collection.No evidence of mass lesion/concerning mass effect.The ventricles are normal in size.  Vascular: No hyperdense vessel or unexpected calcification.  Skull: Hyperostosis frontalis internus. Negative for skull fracture.  Sinuses/Orbits: Minimal ethmoid air cell mucosal thickening. Orbits are unremarkable.  Other: None.  IMPRESSION: No acute intracranial abnormality.  Electronically Signed   By: Maurine Simmering M.D.   On: 05/27/2022 14:07 Note: Reviewed        Physical Exam  General appearance: Well nourished, well developed, and well hydrated. In no apparent acute distress Mental status: Alert, oriented x 3 (person, place, & time)       Respiratory: No evidence of acute respiratory distress Eyes: PERLA Vitals: BP 106/71   Pulse (!) 104   Temp (!) 97.2 F (36.2 C)   Resp 18   Ht _0  (1.778 m)   Wt 215 lb (97.5 kg)   SpO2 97%   BMI 30.85 kg/m  BMI: Estimated body mass index is 30.85 kg/m as calculated from the following:   Height as of this encounter: _1  (1.778 m).   Weight as of this encounter: 215 lb (97.5 kg). Ideal: Ideal body weight: 68.5 kg (151 lb 0.2 oz) Adjusted ideal body weight: 80.1 kg (176 lb 9.7 oz)  Patient presents in wheelchair.  Right femur pain Foley catheter present  Assessment   Diagnosis Status  1. Diabetic peripheral neuropathy ([place])   2. Chronic peripheral neuropathic pain   3. Chronic sensorimotor polyneuropathy with axonal and demyelinating features   4. History of proximal humerus fracture (Left)   5.  Type 2 diabetes mellitus with peripheral neuropathy (HCC)   6. Osteoarthritis of knees (Bilateral)   7. Chronic use of opiate for therapeutic purpose   8. Disorder of peripheral nervous system   9. Lumbar facet syndrome (Bilateral)  10. Chronic shoulder pain (4th area of Pain) (Left)   11. Chronic pain syndrome   12. Chronic feet pain (3ry area of Pain) (Bilateral) (L>R)   13.  Grade 1 Retrolisthesis (53m) of L5 over S1   14. Chronic hand pain (1ry area of Pain) (Bilateral) (L>R)   15. Chronic low back pain (2ry area of Pain) (Bilateral) (L>R) w/o sciatica   16. Chronic knee pain (5th area of Pain) (Bilateral) (L>R)   17. Tricompartment osteoarthritis of knees (Bilateral)   18. Pharmacologic therapy   19. Encounter for medication management   20. Encounter for chronic pain management     Controlled Controlled Controlled    Plan of Care    Ms. LClotee Schlickerhas a current medication list which includes the following long-term medication(s): atorvastatin, fluticasone, furosemide, gabapentin, gabapentin, lantus solostar, levothyroxine, levothyroxine, lisinopril, metformin, nortriptyline, omeprazole, trazodone, triamterene-hydrochlorothiazide, [START ON 10/10/2022] oxycodone-acetaminophen, [START ON 11/09/2022] oxycodone-acetaminophen, and [START ON 12/09/2022] oxycodone-acetaminophen.  Pharmacotherapy (Medications Ordered): Meds ordered this encounter  Medications   oxyCODONE-acetaminophen (PERCOCET) 5-325 MG tablet    Sig: Take 1 tablet by mouth every 8 (eight) hours as needed for severe pain. Must last 30 days.    Dispense:  90 tablet    Refill:  0    DO NOT: delete (not duplicate); no partial-fill (will deny script to complete), no refill request (F/U required). DISPENSE: 1 day early if closed on fill date. WARN: No CNS-depressants within 8 hrs of med.   oxyCODONE-acetaminophen (PERCOCET) 5-325 MG tablet    Sig: Take 1 tablet by mouth every 8 (eight) hours as needed for  severe pain. Must last 30 days.    Dispense:  90 tablet    Refill:  0    DO NOT: delete (not duplicate); no partial-fill (will deny script to complete), no refill request (F/U required). DISPENSE: 1 day early if closed on fill date. WARN: No CNS-depressants within 8 hrs of med.   oxyCODONE-acetaminophen (PERCOCET) 5-325 MG tablet    Sig: Take 1-2 tablets by mouth every 12 (twelve) hours as needed for severe pain. Must last 30 days.    Dispense:  75 tablet    Refill:  0    DO NOT: delete (not duplicate); no partial-fill (will deny script to complete), no refill request (F/U required). DISPENSE: 1 day early if closed on fill date. WARN: No CNS-depressants within 8 hrs of med.   Continue multimodal analgesics as prescribed Continue with PT May re consider [facility] Follow-up plan:   Return in about 3 months (around 01/06/2023) for Medication Management, in person.    Recent Visits Date Type Provider Dept  07/16/22 Office Visit LGillis Santa MD [facility] [facility]  Showing recent visits within past 90 days and meeting all other requirements Today's Visits Date Type Provider Dept  10/06/22 Office Visit LGillis Santa MD [facility]  Showing today's visits and meeting all other requirements Future Appointments No visits were found meeting these conditions. Showing future appointments within next 90 days and meeting all other requirements  I discussed the assessment and treatment plan with the patient. The patient was provided an opportunity to ask questions and all were answered. The patient agreed with the plan and demonstrated an understanding of the instructions.  Patient advised to call back or seek an in-person evaluation if the symptoms or condition worsens.  Duration of encounter: 331mutes.  Total time on encounter, as per AMA guidelines included both the  face-to-face and non-face-to-face time personally spent by the physician and/or  other qualified health care professional(s) on the day of the encounter (includes time in activities that require the physician or other qualified health care professional and does not include time in activities normally performed by clinical staff). Physician's time may include the following activities when performed: preparing to see the patient (eg, review of tests, pre-charting review of records) obtaining and/or reviewing separately obtained history performing a medically appropriate examination and/or evaluation counseling and educating the patient/family/caregiver ordering medications, tests, or procedures referring and communicating with other health care professionals (when not separately reported) documenting clinical information in the electronic or other health record independently interpreting results (not separately reported) and communicating results to the patient/ family/caregiver care coordination (not separately reported)  Note by: Gillis Santa, MD Date: 10/06/2022; Time: 2:44 PM

## 2022-10-06 NOTE — Progress Notes (Signed)
Nursing Pain Medication Assessment:  Safety precautions to be maintained throughout the outpatient stay will include: orient to surroundings, keep bed in low position, maintain call bell within reach at all times, provide assistance with transfer out of bed and ambulation.  Medication Inspection Compliance: Pill count conducted under aseptic conditions, in front of the patient. Neither the pills nor the bottle was removed from the patient's sight at any time. Once count was completed pills were immediately returned to the patient in their original bottle.  Medication: Oxycodone/APAP Pill/Patch Count:  15 of 90 pills remain Pill/Patch Appearance: Markings consistent with prescribed medication Bottle Appearance: Standard pharmacy container. Clearly labeled. Filled Date: 09 / 13 / 2023 Last Medication intake:  Today

## 2022-10-07 ENCOUNTER — Encounter: Payer: Self-pay | Admitting: Physical Therapy

## 2022-10-07 ENCOUNTER — Ambulatory Visit: Payer: BLUE CROSS/BLUE SHIELD | Admitting: Physical Therapy

## 2022-10-07 DIAGNOSIS — M6281 Muscle weakness (generalized): Secondary | ICD-10-CM | POA: Diagnosis not present

## 2022-10-07 DIAGNOSIS — R2689 Other abnormalities of gait and mobility: Secondary | ICD-10-CM

## 2022-10-07 DIAGNOSIS — R262 Difficulty in walking, not elsewhere classified: Secondary | ICD-10-CM

## 2022-10-07 NOTE — Therapy (Signed)
OUTPATIENT PHYSICAL THERAPY LOWER EXTREMITY treatment    Patient Name: Gabriella Marsh MRN: [id number] DOB:Dec 27, 1965, 56 y.o., female Today's Date: 10/07/2022   PT End of Session - 10/07/22 1416     Visit Number 3    Number of Visits 24    Date for PT Re-Evaluation 12/21/22    Authorization Type 20 visits per calandar year    Progress Note Due on Visit 10    PT Start Time 1432    PT Stop Time 1514    PT Time Calculation (min) 42 min    Equipment Utilized During Treatment Gait belt    Activity Tolerance Patient tolerated treatment well    Behavior During Therapy WFL for tasks assessed/performed               Past Medical History:  Diagnosis Date   Arthritis    knees   Blood clotting disorder ([place])    on toe    Degenerative disc disease, lumbar    Diabetes mellitus without complication (HCC)    GERD (gastroesophageal reflux disease)    Headache    daily - AM (has not been able to have SPG blocks lately)   Hyperlipidemia    Hypertension    Neuropathy    feet   Vertigo    3-4x/yr   Past Surgical History:  Procedure Laterality Date   ABDOMINAL HYSTERECTOMY     But still has cervix   AMPUTATION TOE Right 07/24/2021   Procedure: AMPUTATION TOE;  Surgeon: Sharlotte Alamo, DPM;  Location: [facility] ORS;  Service: Podiatry;  Laterality: Right;   CESAREAN SECTION     CHOLECYSTECTOMY     COLONOSCOPY WITH PROPOFOL N/A 02/20/2022   Procedure: COLONOSCOPY WITH PROPOFOL;  Surgeon: [name], MD;  Location[facility] ENDOSCOPY;  Service: Gastroenterology;  Laterality: N/A;   left arm frature  10/12/2020   OVARIAN CYST SURGERY     PARATHYROIDECTOMY  03/28/2021   Procedure: PARATHYROIDECTOMY AUTOTRANSPLANT;  Surgeon: Fredirick Maudlin, MD;  Location: [facility] ORS;  Service: General;;   right femur surgery Right    SHOULDER SURGERY Left 12/26/014   Dr. Little Ishikawa, [place] N/A 03/28/2021   Procedure: THYROIDECTOMY, total;  Surgeon: Fredirick Maudlin, MD;  Location:  [facility] ORS;  Service: General;  Laterality: N/A;  Provider requesting 3 hours /180 minutes for procedure   TONSILLECTOMY AND ADENOIDECTOMY     UTERINE FIBROID SURGERY     Patient Active Problem List   Diagnosis Date Noted   Need for influenza vaccination 09/10/2022   Primary insomnia 09/10/2022   Impaired functional mobility, balance, gait, and endurance 06/09/2022   Type 2 diabetes mellitus with diabetic neuropathy, without long-term current use of insulin ([place]) 04/24/2022   Acquired hypothyroidism 04/24/2022   Hyperlipidemia associated with type 2 diabetes mellitus ([place]) 04/24/2022   Fall 04/24/2022   Dizziness 04/24/2022   Iron deficiency anemia 04/24/2022   Colon cancer screening 01/26/2022   Encounter for screening mammogram for malignant neoplasm of breast 01/26/2022   Chronic sensorimotor polyneuropathy with axonal and demyelinating features 01/12/2022   Osteoarthritis of knees (Bilateral) 01/12/2022   Tricompartment osteoarthritis of knees (Bilateral) 01/12/2022    Grade 1 Retrolisthesis (37m) of L5 over S1 01/12/2022   History of proximal humerus fracture (Left) 01/12/2022   Vitamin D deficiency 01/12/2022   Elevated hemoglobin A1c 01/12/2022   Hypochloremia 01/12/2022   Hyperglycemia 01/12/2022   Chronic peripheral neuropathic pain 01/12/2022   Type 2 diabetes mellitus with peripheral neuropathy [place] 01/12/2022  Glands swollen 01/09/2022   Acute cough 01/09/2022   Acute non-recurrent pansinusitis 01/09/2022   Rhinorrhea 01/09/2022   Left acute otitis media 01/09/2022   Pharmacologic therapy 11/12/2021   Disorder of skeletal system 11/12/2021   Problems influencing health status 11/12/2021   Chronic hand pain (1ry area of Pain) (Bilateral) (L>R) 11/12/2021   Chronic feet pain (3ry area of Pain) (Bilateral) (L>R) 11/12/2021   Chronic shoulder pain (4th area of Pain) (Left) 11/12/2021   Chronic knee pain (5th area of Pain) (Bilateral) (L>R) 11/12/2021   Lumbar facet  syndrome (Bilateral) 11/12/2021   Muscle spasm 10/22/2021   Type 2 diabetes mellitus with microalbuminuria, without long-term current use of insulin ([place]) 10/22/2021   Diabetic foot infection ([place]) 07/22/2021   Depression with anxiety 07/22/2021   Sepsis ([place]) 07/22/2021   COVID-19 virus infection 07/22/2021   Hypocalcemia 07/17/2021   Chronic pain syndrome 07/17/2021   S/P total thyroidectomy 03/28/2021   Thyroid nodule    Blue toe syndrome of left lower extremity ([place]) 10/01/2020   Schamberg's purpura 10/09/2019   Leg swelling 10/09/2019   Benign positional vertigo 02/03/2016   Common migraine with intractable migraine 06/18/2015   Anxiety 05/04/2015   Adaptation reaction 05/03/2015   DDD (degenerative disc disease), lumbosacral 05/03/2015   Hypercholesteremia 05/03/2015   Insomnia due to medical condition 05/03/2015   Adiposity 05/03/2015   Disorder of peripheral nervous system 05/03/2015   Snores 05/03/2015   Diabetic peripheral neuropathy ([place]) 03/06/2015   Essential hypertension 03/06/2015   Elevated liver function tests 03/06/2015   Obesity 03/06/2015   GERD (gastroesophageal reflux disease) 03/06/2015   Multinodular goiter 03/06/2015   RLS (restless legs syndrome) 03/06/2015   Chronic low back pain (2ry area of Pain) (Bilateral) (L>R) w/o sciatica 03/06/2015   Leg cramps 03/06/2015    PCP: [name], FNP  REFERRING PROVIDER: [name], FNP  REFERRING DIAG: [phone] (ICD-10-CM) - Fall, subsequent encounter  THERAPY DIAG:  Muscle weakness (generalized)  Difficulty in walking, not elsewhere classified  Other abnormalities of gait and mobility  Rationale for Evaluation and Treatment Rehabilitation  ONSET DATE: 06/18/22  SUBJECTIVE:   SUBJECTIVE STATEMENT: Pt reports she has been working hard on her exercises since last session.  Patient reports she has scheduled appointment with orthotist for early November.  Patient was informed physical therapist  received documents she dropped off but they had to be signed by her position so they were faxed over to physician along with physical therapist last 2 notes.   PERTINENT HISTORY: Pt reports her fall initially occurred in the evening when her husband was not home. Pt got up and had her Rolator and she walked toward her hallway. When she turned around to turn the light on she went completely down on her R leg and broke her femur distally and twisted her knee. When EMT arrived she had to straighten her LE to immobilize it. Pt went to [facility] for the trauma.   Upon arrival she was evaluated and had to get MRI and imaging. Progressed to 06/19/22. Pt had surgery that night. Pt had to have plate and screws in her knee and rod in her femur. Pt was in hospital for 11 days then progressed to [facility] for [facility] days. [facility].   Upon coming home with [facility] pt came home with catheter. Pt came home and was relieved. Pt husband helps with everything doing "everything" for her except she helps  with cooking.   Pt considered home health physical therapy but the cost of care would be too high ( $125 per visit) and this.   Prior to injury pt was ambulating with rolator. Pt used bedside commode at that time. Pt reports 6 falls during this year. Pt reports 2-3 falls with rolator and 2-3 falls walking between bathroom and bedroom and 1 fall in bedroom.   Pt had another fall at [facility] where she broke her left humerus about 2 years ago.   PAIN:  Are you having pain? No  PRECAUTIONS: Other: WBAT  WEIGHT BEARING RESTRICTIONS     Gardenia Phlegm, MD MPH - 09/03/2022 3:15 PM EDT   Formatting of this note might be different from the original. At this time, you can continue weightbearing as tolerated to right lower extremity. Please continue working on range of motion and strengthening. We have given you prescription for physical therapy.   FALLS:  Has patient  fallen in last 6 months? Yes. 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 Pt does not know if she will be able to sit in one position for that long. Not sure if it will be feasible to get back to work.   PLOF: Requires assistive device for independence, Needs assistance with ADLs, Needs assistance with homemaking, and Needs assistance with gait  PATIENT GOALS Pt would like to get legs moving a little bit more. Pt wants to be able to get to bathroom on her own.  Get upper body strength improved.     OBJECTIVE:   DIAGNOSTIC FINDINGS:  From 9/14 visit with Flonnie Hailstone, MD, 09/03/2022 3:36 PM EDT   Healing internally fixed distal femoral fracture without hardware complication or change in alignment.   PATIENT SURVEYS:  FOTO 19 risk adjusted goal of 49%  COGNITION:  Overall cognitive status: Within functional limits for tasks assessed     SENSATION: Impaired, not formally tested this date     POSTURE: rounded shoulders and forward head  PALPATION: B LE felt cool to the touch.   LOWER EXTREMITY ROM: Limited AROM on the left in seated position, has initiation of hip abduction and adduction but unabel to lift L LE into hip flexion or knee extension against gravity   LOWER EXTREMITY MMT:  MMT Right eval Left eval  Hip flexion 3 2  Hip extension    Hip abduction 3+ 2+  Hip adduction 3+ 2  Hip internal rotation 3   Hip external rotation 3   Knee flexion 3+ 1+  Knee extension 3+ 1  Ankle dorsiflexion 3 0  Ankle plantarflexion 3+ 2  Ankle inversion    Ankle eversion     (Blank rows = not tested)  LOWER EXTREMITY  SPECIAL TESTS:  N/a  FUNCTIONAL TESTS: FIST completed 10/16: pt scores 42/56   GAIT: Distance walked: Pt unable to ambulate at this time.      TODAY'S TREATMENT: LAQ 1.5# AW on R AROm, PROM on L Heel raises with balls of feet on 1/2 foam 2 x 10 B LE with 1.5# AW  Marching with 1.5# AW on R and PROM/ AAROM on L  Dynadisc press down x 10 ea LE    Parallel bar STS attempt from [facility] - all attempts in // bars.  -weight shifting, UE support heavy. Heel lifts if able.   - first attempt pt unable to stand but is able to lift off of chair. Pt is anxious with stand transition and this was evident in first attempt  - second attempt closer to transition but hand slipped on // bars   -Third attempt patient still lifts hips out of chair but is very reluctant to put weight through her lower extremities.  Physical therapy is utilized sit to stand lift in order to allow patient successful standing opportunity in order to allow improved comfort with weightbearing to her legs bilaterally.  Patient able to tolerate this well reports it feels really good on her back patient is able to stand for 10 minutes but does have some fatigue in her lower extremities at this time.     PATIENT EDUCATION:  Education details: HEP, POC Person educated: Patient Education method: Explanation Education comprehension: verbalized understanding   HOME EXERCISE PROGRAM: Access Code: [email or web address] URL: [email or web address] Date: 09/28/2022 Prepared by: Rivka Barbara  Exercises - Seated March  - 1 x daily - 7 x weekly - 2 sets - 10 reps - Seated Long Arc Quad  - 1 x daily - 7 x weekly - 2 sets - 10 reps - Seated Heel Raise  - 1 x daily - 7 x weekly - 2 sets - 20 reps - Seated hip abduction with band  - 1 x daily - 7 x weekly - 2 sets - 10 reps - Seated Hip Adduction Isometrics with Ball  - 1 x daily - 7 x weekly - 2 sets - 10 reps  ASSESSMENT:  CLINICAL IMPRESSION: Patient is a 56 y.o. female who  was seen today for physical therapy treatment for right hip fracture and IM nail fixation on 06-19-2022.  Patient currently unable to stand independently and requires slide board transfers to and from wheelchair with assistance.  Attempted standing in // bars with patient with 1 times max assist with patient unable to stand successfully.  Patient was able to stand utilizing [place] sit to stand and able to bear some weight through her lower extremities but it should be noted that [place] sit to stand is a max assist and transfer into standing.  Patient happy with her progress at this time but will continue to need significant lower extremity strengthening to improve her ability to stand and perform functional activities.  During standing patient did have some signs and symptoms of atrophy in her thighs and glutes which are likely limiting her ability to stand independently.Pt will continue to benefit from skilled physical therapy intervention to address impairments, improve QOL, and attain therapy goals.     OBJECTIVE IMPAIRMENTS Abnormal gait, decreased activity tolerance, decreased balance, decreased endurance, decreased mobility, difficulty walking, decreased ROM, decreased strength, impaired perceived functional ability, and impaired sensation.   ACTIVITY LIMITATIONS carrying, lifting, bending, standing, squatting, stairs, transfers, bed mobility, bathing, toileting, dressing, self feeding, hygiene/grooming, and locomotion level  PARTICIPATION LIMITATIONS: meal prep, cleaning, laundry, driving, shopping, community activity, occupation, and yard work  PERSONAL FACTORS 3+ comorbidities: Polyneuropathy, hypertension, hyperlipidemia, diabetes mellitus, arthritis  are also affecting patient's functional outcome.   REHAB POTENTIAL: Fair secondary to comorbidities above particularly polyneuropathy affecting her lower extremity strength and muscle activation  CLINICAL DECISION MAKING: Evolving/moderate  complexity  EVALUATION COMPLEXITY: Moderate   GOALS: Goals reviewed with patient?  Yes  SHORT TERM GOALS: Target date: 10/26/2022     Patient will be independent in home exercise program to improve strength/mobility for better functional independence with ADLs. Baseline: No HEP currently  Goal status: INITIAL   LONG TERM GOALS: Target date: 12/30/2022   Patient will be modified independent for slide board transfers in order to improve her ability to transfer to and from wheelchair as well as bedside commode for improvement in ADLs. Baseline: Patient currently requires assistance with slide board transfers is unable to utilize bedside commode and requires use of catheter and bedpan Goal status: INITIAL  2.  Patient will perform sit to and from stand with min assist from physical therapist in order to indicate improved lower extremity functional strength and improved ability to perform functional transfers and functional leg activities within her home. Baseline: Patient unable to stand independently at this time due to lower extremity weakness Goal status: INITIAL  3.  Patient will improve focus on therapeutic outcome score to 35 or greater in order to indicate improved perceived functional ability Baseline: 19 Goal status: INITIAL  4.  Patient will perform stand pivot transfer from wheelchair to mat table with moderate assistance or less in order to improve her ability to perform transfers within the home to improve her overall functional capacity. Baseline:  Goal status: INITIAL     PLAN: PT FREQUENCY: 2x/week  PT DURATION: 12 weeks  PLANNED INTERVENTIONS: Therapeutic exercises, Therapeutic activity, Neuromuscular re-education, Balance training, Gait training, Patient/Family education, Self Care, Joint mobilization, Stair training, DME instructions, and Electrical stimulation  PLAN FOR NEXT SESSION: interventions to improve slide board transfer efficacy independently as well  as intervention to improve strength and efficacy for future completion of stand pivot transfers. Bed mobility and transfer training as appropriate. May need x 2 max for standing initially.    Particia Lather, PT 10/07/2022, 5:00 PM

## 2022-10-12 ENCOUNTER — Other Ambulatory Visit: Payer: Self-pay | Admitting: Family Medicine

## 2022-10-12 DIAGNOSIS — R809 Proteinuria, unspecified: Secondary | ICD-10-CM

## 2022-10-13 ENCOUNTER — Ambulatory Visit (INDEPENDENT_AMBULATORY_CARE_PROVIDER_SITE_OTHER): Payer: BLUE CROSS/BLUE SHIELD | Admitting: Physician Assistant

## 2022-10-13 ENCOUNTER — Ambulatory Visit: Payer: BLUE CROSS/BLUE SHIELD

## 2022-10-13 ENCOUNTER — Ambulatory Visit: Payer: BLUE CROSS/BLUE SHIELD | Admitting: Physician Assistant

## 2022-10-13 DIAGNOSIS — R339 Retention of urine, unspecified: Secondary | ICD-10-CM | POA: Diagnosis not present

## 2022-10-13 DIAGNOSIS — R2689 Other abnormalities of gait and mobility: Secondary | ICD-10-CM

## 2022-10-13 DIAGNOSIS — M6281 Muscle weakness (generalized): Secondary | ICD-10-CM

## 2022-10-13 DIAGNOSIS — R262 Difficulty in walking, not elsewhere classified: Secondary | ICD-10-CM

## 2022-10-13 NOTE — Progress Notes (Signed)
Cath Change/ Replacement  Patient is present today for a catheter change due to urinary retention.  68m of water was removed from the balloon, a 16FR foley cath was removed without difficulty.  Patient was cleaned and prepped in a sterile fashion with betadine. A 16 FR foley cath was replaced into the bladder, no complications were noted. Urine return was not noted, catheter was palpated in the urethra via the anterior vaginal wall, and patient was counseled to return to clinic for evaluation if no drainage within 2 hours. The balloon was filled with 141mof sterile water. A leg bag was attached for drainage.  A night bag was also given to the patient. Patient tolerated well.    Performed by: SaDebroah LoopPA-C and CaElberta LeatherwoodCMA  Additional notes: Making progress in PT, was able to stand for 4 minutes earlier this week. Still not sufficiently mobile, prefers to keep indwelling Foley for now. Will plan for Foley change vs VT next month depending on rehab progress.  Follow up: Return in about 4 weeks (around 11/10/2022) for Cath change vs voiding trial.

## 2022-10-13 NOTE — Therapy (Signed)
OUTPATIENT PHYSICAL THERAPY LOWER EXTREMITY treatment    Patient Name: Gabriella Marsh MRN: [id number] DOB:12-27-1965, 56 y.o., female Today's Date: 10/13/2022   PT End of Session - 10/13/22 1525     Visit Number 4    Number of Visits 24    Date for PT Re-Evaluation 12/21/22    Authorization Type [employer] Other    Authorization Time Period 09/28/22-12/21/22    Progress Note Due on Visit 10    PT Start Time 1515    PT Stop Time 1555    PT Time Calculation (min) 40 min    Equipment Utilized During Treatment Gait belt    Activity Tolerance Patient tolerated treatment well;No increased pain    Behavior During Therapy WFL for tasks assessed/performed               Past Medical History:  Diagnosis Date   Arthritis    knees   Blood clotting disorder ([place])    on toe    Degenerative disc disease, lumbar    Diabetes mellitus without complication (HCC)    GERD (gastroesophageal reflux disease)    Headache    daily - AM (has not been able to have SPG blocks lately)   Hyperlipidemia    Hypertension    Neuropathy    feet   Vertigo    3-4x/yr   Past Surgical History:  Procedure Laterality Date   ABDOMINAL HYSTERECTOMY     But still has cervix   AMPUTATION TOE Right 07/24/2021   Procedure: AMPUTATION TOE;  Surgeon: Sharlotte Alamo, DPM;  Location: [facility] ORS;  Service: Podiatry;  Laterality: Right;   CESAREAN SECTION     CHOLECYSTECTOMY     COLONOSCOPY WITH PROPOFOL N/A 02/20/2022   Procedure: COLONOSCOPY WITH PROPOFOL;  Surgeon: [name], MD;  Location: [facility] ENDOSCOPY;  Service: Gastroenterology;  Laterality: N/A;   left arm frature  10/12/2020   OVARIAN CYST SURGERY     PARATHYROIDECTOMY  03/28/2021   Procedure: PARATHYROIDECTOMY AUTOTRANSPLANT;  Surgeon: Fredirick Maudlin, MD;  Location: [facility] ORS;  Service: General;;   right femur surgery Right    SHOULDER SURGERY Left 12/26/014   Dr. Little Ishikawa, [place] N/A 03/28/2021   Procedure: THYROIDECTOMY, total;   Surgeon: Fredirick Maudlin, MD;  Location: [facility] ORS;  Service: General;  Laterality: N/A;  Provider requesting 3 hours /180 minutes for procedure   TONSILLECTOMY AND ADENOIDECTOMY     UTERINE FIBROID SURGERY     Patient Active Problem List   Diagnosis Date Noted   Need for influenza vaccination 09/10/2022   Primary insomnia 09/10/2022   Impaired functional mobility, balance, gait, and endurance 06/09/2022   Type 2 diabetes mellitus with diabetic neuropathy, without long-term current use of insulin ([place]) 04/24/2022   Acquired hypothyroidism 04/24/2022   Hyperlipidemia associated with type 2 diabetes mellitus ([place]) 04/24/2022   Fall 04/24/2022   Dizziness 04/24/2022   Iron deficiency anemia 04/24/2022   Colon cancer screening 01/26/2022   Encounter for screening mammogram for malignant neoplasm of breast 01/26/2022   Chronic sensorimotor polyneuropathy with axonal and demyelinating features 01/12/2022   Osteoarthritis of knees (Bilateral) 01/12/2022   Tricompartment osteoarthritis of knees (Bilateral) 01/12/2022    Grade 1 Retrolisthesis (2m) of L5 over S1 01/12/2022   History of proximal humerus fracture (Left) 01/12/2022   Vitamin D deficiency 01/12/2022   Elevated hemoglobin A1c 01/12/2022   Hypochloremia 01/12/2022   Hyperglycemia 01/12/2022   Chronic peripheral neuropathic pain 01/12/2022   Type 2 diabetes mellitus with  peripheral neuropathy (HCC) 01/12/2022   Glands swollen 01/09/2022   Acute cough 01/09/2022   Acute non-recurrent pansinusitis 01/09/2022   Rhinorrhea 01/09/2022   Left acute otitis media 01/09/2022   Pharmacologic therapy 11/12/2021   Disorder of skeletal system 11/12/2021   Problems influencing health status 11/12/2021   Chronic hand pain (1ry area of Pain) (Bilateral) (L>R) 11/12/2021   Chronic feet pain (3ry area of Pain) (Bilateral) (L>R) 11/12/2021   Chronic shoulder pain (4th area of Pain) (Left) 11/12/2021   Chronic knee pain (5th area of Pain)  (Bilateral) (L>R) 11/12/2021   Lumbar facet syndrome (Bilateral) 11/12/2021   Muscle spasm 10/22/2021   Type 2 diabetes mellitus with microalbuminuria, without long-term current use of insulin ([place]) 10/22/2021   Diabetic foot infection ([place]) 07/22/2021   Depression with anxiety 07/22/2021   Sepsis ([place]) 07/22/2021   COVID-19 virus infection 07/22/2021   Hypocalcemia 07/17/2021   Chronic pain syndrome 07/17/2021   S/P total thyroidectomy 03/28/2021   Thyroid nodule    Blue toe syndrome of left lower extremity ([place]) 10/01/2020   Schamberg's purpura 10/09/2019   Leg swelling 10/09/2019   Benign positional vertigo 02/03/2016   Common migraine with intractable migraine 06/18/2015   Anxiety 05/04/2015   Adaptation reaction 05/03/2015   DDD (degenerative disc disease), lumbosacral 05/03/2015   Hypercholesteremia 05/03/2015   Insomnia due to medical condition 05/03/2015   Adiposity 05/03/2015   Disorder of peripheral nervous system 05/03/2015   Snores 05/03/2015   Diabetic peripheral neuropathy ([place]) 03/06/2015   Essential hypertension 03/06/2015   Elevated liver function tests 03/06/2015   Obesity 03/06/2015   GERD (gastroesophageal reflux disease) 03/06/2015   Multinodular goiter 03/06/2015   RLS (restless legs syndrome) 03/06/2015   Chronic low back pain (2ry area of Pain) (Bilateral) (L>R) w/o sciatica 03/06/2015   Leg cramps 03/06/2015    PCP: [name], FNP  REFERRING PROVIDER: [name], FNP  REFERRING DIAG: [phone] (ICD-10-CM) - Fall, subsequent encounter  THERAPY DIAG:  Muscle weakness (generalized)  Difficulty in walking, not elsewhere classified  Other abnormalities of gait and mobility  Rationale for Evaluation and Treatment Rehabilitation  ONSET DATE: 06/18/22  SUBJECTIVE:   SUBJECTIVE STATEMENT: Pt doing well today. No updates. Wants to make sure she gets the correct leg brace on the 8th.    PERTINENT HISTORY: Pt reports her fall initially  occurred in the evening when her husband was not home. Pt got up and had her Rolator and she walked toward her hallway. When she turned around to turn the light on she went completely down on her R leg and broke her femur distally and twisted her knee. When EMT arrived she had to straighten her LE to immobilize it. Pt went to [facility] hospital for the trauma.   Upon arrival she was evaluated and had to get MRI and imaging. Progressed to 06/19/22. Pt had surgery that night. Pt had to have plate and screws in her knee and rod in her femur. Pt was in hospital for 11 days then progressed to [facility] for 30 days. Pt [facility] at [facility]   Upon coming home with [facility] pt came home with catheter. Pt came home and was relieved. Pt husband helps with everything doing "everything" for her except she helps with cooking.   Pt considered home health physical therapy but the cost of care would be too high ( $125 per visit) and this.   Prior to injury pt was ambulating  with rolator. Pt used bedside commode at that time. Pt reports 6 falls during this year. Pt reports 2-3 falls with rolator and 2-3 falls walking between bathroom and bedroom and 1 fall in bedroom.   Pt had another fall at [facility] where she broke her left humerus about 2 years ago.   PAIN:  Are you having pain? No  PRECAUTIONS: Other: WBAT  WEIGHT BEARING RESTRICTIONS     Gardenia Phlegm, MD MPH - 09/03/2022 3:15 PM EDT   Formatting of this note might be different from the original. At this time, you can continue weightbearing as tolerated to right lower extremity. Please continue working on range of motion and strengthening. We have given you prescription for physical therapy.   FALLS:  Has patient fallen in last 6 months? Yes. 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 Pt does not know if she will be able to sit in one position for that long. Not sure if it will be feasible to get back to work.   PLOF: Requires assistive device for independence, Needs assistance with ADLs, Needs assistance with homemaking, and Needs assistance with gait  PATIENT GOALS Pt would like to get legs moving a little bit more. Pt wants to be able to get to bathroom on her own.  Get upper body strength improved.          TODAY'S TREATMENT: -slide board transfer to wide mat, lateral scoot transfer from one side to the opposite, then back; lateral slideboard trasnfer to [place].  Cable resisted strenghtenin gRLE:  -seated hip extension 12.5lb 1x15 -seated hamstrings flexion 7.5lb x15 -LAQ x15, AA/ROM to TKE 1x15 -seated hip extension 12.5lb 1x15 -seated hamstrings flexion 7.5lb x15 -LAQ x15, AA/ROM to TKE 1x15 -seated RDL yellow ball x20      PATIENT EDUCATION:  Education details: HEP, POC Person educated: Patient Education method: Explanation Education comprehension: verbalized understanding   HOME EXERCISE PROGRAM: Access Code: [email or web address] URL: [email or web address] Date: 09/28/2022 Prepared by: Rivka Barbara  Exercises - Seated March  - 1 x daily - 7 x weekly - 2 sets - 10 reps - Seated Long Arc Quad  - 1 x daily - 7 x weekly - 2 sets - 10 reps - Seated Heel Raise  - 1 x daily - 7 x weekly - 2 sets - 20 reps - Seated hip abduction with band  - 1 x daily - 7 x weekly - 2 sets - 10 reps - Seated Hip Adduction Isometrics with Ball  - 1 x daily - 7 x weekly - 2 sets - 10 reps  ASSESSMENT:  CLINICAL IMPRESSION: First portion focused on function strength and motor control for transfers technique. Finished with cable resisted RLE  strengthening. Pt will continue to benefit from skilled physical therapy intervention to address impairments, improve QOL, and attain therapy goals.     OBJECTIVE IMPAIRMENTS Abnormal gait, decreased activity tolerance, decreased balance, decreased endurance, decreased mobility, difficulty walking, decreased ROM, decreased strength, impaired perceived functional ability, and impaired sensation.   ACTIVITY LIMITATIONS carrying, lifting, bending, standing, squatting, stairs, transfers, bed mobility, bathing, toileting, dressing, self feeding, hygiene/grooming, and locomotion level  PARTICIPATION LIMITATIONS: meal prep, cleaning, laundry, driving, shopping, community activity, occupation, and yard work  PERSONAL FACTORS 3+ comorbidities: Polyneuropathy, hypertension, hyperlipidemia, diabetes mellitus, arthritis  are also affecting patient's functional outcome.   REHAB POTENTIAL: Fair secondary to comorbidities above particularly polyneuropathy affecting her lower extremity strength and muscle activation  CLINICAL DECISION MAKING: Evolving/moderate complexity  EVALUATION COMPLEXITY: Moderate   GOALS: Goals reviewed with patient? Yes  SHORT TERM GOALS: Target date: 10/26/2022     Patient will be independent in home exercise program to improve strength/mobility for better functional independence with ADLs. Baseline: No HEP currently  Goal status: INITIAL   LONG TERM GOALS: Target date: 01/05/2023   Patient will be modified independent for slide board transfers in order to improve her ability to transfer to and from wheelchair as well as bedside commode for improvement in ADLs. Baseline: Patient currently requires assistance with slide board transfers is unable to utilize bedside commode and requires use of catheter and bedpan Goal status: INITIAL  2.  Patient will perform sit to and from stand with min assist from physical therapist in order to indicate improved lower extremity functional  strength and improved ability to perform functional transfers and functional leg activities within her home. Baseline: Patient unable to stand independently at this time due to lower extremity weakness Goal status: INITIAL  3.  Patient will improve focus on therapeutic outcome score to 35 or greater in order to indicate improved perceived functional ability Baseline: 19 Goal status: INITIAL  4.  Patient will perform stand pivot transfer from wheelchair to mat table with moderate assistance or less in order to improve her ability to perform transfers within the home to improve her overall functional capacity. Baseline:  Goal status: INITIAL     PLAN: PT FREQUENCY: 2x/week  PT DURATION: 12 weeks  PLANNED INTERVENTIONS: Therapeutic exercises, Therapeutic activity, Neuromuscular re-education, Balance training, Gait training, Patient/Family education, Self Care, Joint mobilization, Stair training, DME instructions, and Electrical stimulation  PLAN FOR NEXT SESSION: interventions to improve slide board transfer efficacy independently as well as intervention to improve strength and efficacy for future completion of stand pivot transfers. Bed mobility and transfer training as appropriate. May need x 2 max for standing initially.    4:06 PM, 10/13/22 Etta Grandchild, PT, DPT Physical Therapist - [facility]  Outpatient Physical Therapy- [facility] [phone]     Severance C, PT 10/13/2022, 3:38 PM

## 2022-10-15 ENCOUNTER — Ambulatory Visit: Payer: BLUE CROSS/BLUE SHIELD | Admitting: Physical Therapy

## 2022-10-19 ENCOUNTER — Encounter (INDEPENDENT_AMBULATORY_CARE_PROVIDER_SITE_OTHER): Payer: Self-pay

## 2022-10-19 ENCOUNTER — Ambulatory Visit: Payer: BLUE CROSS/BLUE SHIELD

## 2022-10-19 DIAGNOSIS — R2689 Other abnormalities of gait and mobility: Secondary | ICD-10-CM

## 2022-10-19 DIAGNOSIS — M6281 Muscle weakness (generalized): Secondary | ICD-10-CM

## 2022-10-19 DIAGNOSIS — R262 Difficulty in walking, not elsewhere classified: Secondary | ICD-10-CM

## 2022-10-19 NOTE — Therapy (Signed)
OUTPATIENT PHYSICAL THERAPY LOWER EXTREMITY treatment    Patient Name: Gabriella Marsh MRN: [id number] DOB:[birth year], 56 y.o., female Today's Date: 10/19/2022   PT End of Session - 10/19/22 1611     Visit Number 5    Number of Visits 24    Date for PT Re-Evaluation 12/21/22    Authorization Type BCBS Other    Authorization Time Period 09/28/22-12/21/22    Progress Note Due on Visit 10    PT Start Time 1602    PT Stop Time 1642    PT Time Calculation (min) 40 min    Activity Tolerance Patient tolerated treatment well;No increased pain    Behavior During Therapy WFL for tasks assessed/performed               Past Medical History:  Diagnosis Date   Arthritis    knees   Blood clotting disorder ([place])    on toe    Degenerative disc disease, lumbar    Diabetes mellitus without complication (HCC)    GERD (gastroesophageal reflux disease)    Headache    daily - AM (has not been able to have SPG blocks lately)   Hyperlipidemia    Hypertension    Neuropathy    feet   Vertigo    3-4x/yr   Past Surgical History:  Procedure Laterality Date   ABDOMINAL HYSTERECTOMY     But still has cervix   AMPUTATION TOE Right 07/24/2021   Procedure: AMPUTATION TOE;  Surgeon: Sharlotte Alamo, DPM;  Location: [facility] ORS;  Service: Podiatry;  Laterality: Right;   CESAREAN SECTION     CHOLECYSTECTOMY     COLONOSCOPY WITH PROPOFOL N/A 02/20/2022   Procedure: COLONOSCOPY WITH PROPOFOL;  Surgeon: [name], MD;  Location: [facility] ENDOSCOPY;  Service: Gastroenterology;  Laterality: N/A;   left arm frature  10/12/2020   OVARIAN CYST SURGERY     PARATHYROIDECTOMY  03/28/2021   Procedure: PARATHYROIDECTOMY AUTOTRANSPLANT;  Surgeon: Fredirick Maudlin, MD;  Location: [facility] ORS;  Service: General;;   right femur surgery Right    SHOULDER SURGERY Left 12/26/014   Dr. Little Ishikawa, [place] N/A 03/28/2021   Procedure: THYROIDECTOMY, total;  Surgeon: Fredirick Maudlin, MD;  Location: [facility]  ORS;  Service: General;  Laterality: N/A;  Provider requesting 3 hours /180 minutes for procedure   TONSILLECTOMY AND ADENOIDECTOMY     UTERINE FIBROID SURGERY     Patient Active Problem List   Diagnosis Date Noted   Need for influenza vaccination 09/10/2022   Primary insomnia 09/10/2022   Impaired functional mobility, balance, gait, and endurance 06/09/2022   Type 2 diabetes mellitus with diabetic neuropathy, without long-term current use of insulin ([place]) 04/24/2022   Acquired hypothyroidism 04/24/2022   Hyperlipidemia associated with type 2 diabetes mellitus ([place]) 04/24/2022   Fall 04/24/2022   Dizziness 04/24/2022   Iron deficiency anemia 04/24/2022   Colon cancer screening 01/26/2022   Encounter for screening mammogram for malignant neoplasm of breast 01/26/2022   Chronic sensorimotor polyneuropathy with axonal and demyelinating features 01/12/2022   Osteoarthritis of knees (Bilateral) 01/12/2022   Tricompartment osteoarthritis of knees (Bilateral) 01/12/2022    Grade 1 Retrolisthesis (35m) of L5 over S1 01/12/2022   History of proximal humerus fracture (Left) 01/12/2022   Vitamin D deficiency 01/12/2022   Elevated hemoglobin A1c 01/12/2022   Hypochloremia 01/12/2022   Hyperglycemia 01/12/2022   Chronic peripheral neuropathic pain 01/12/2022   Type 2 diabetes mellitus with peripheral neuropathy [place] 01/12/2022   Glands swollen 01/09/2022  Acute cough 01/09/2022   Acute non-recurrent pansinusitis 01/09/2022   Rhinorrhea 01/09/2022   Left acute otitis media 01/09/2022   Pharmacologic therapy 11/12/2021   Disorder of skeletal system 11/12/2021   Problems influencing health status 11/12/2021   Chronic hand pain (1ry area of Pain) (Bilateral) (L>R) 11/12/2021   Chronic feet pain (3ry area of Pain) (Bilateral) (L>R) 11/12/2021   Chronic shoulder pain (4th area of Pain) (Left) 11/12/2021   Chronic knee pain (5th area of Pain) (Bilateral) (L>R) 11/12/2021   Lumbar facet syndrome  (Bilateral) 11/12/2021   Muscle spasm 10/22/2021   Type 2 diabetes mellitus with microalbuminuria, without long-term current use of insulin ([place]) 10/22/2021   Diabetic foot infection ([place]) 07/22/2021   Depression with anxiety 07/22/2021   Sepsis ([place]) 07/22/2021   COVID-19 virus infection 07/22/2021   Hypocalcemia 07/17/2021   Chronic pain syndrome 07/17/2021   S/P total thyroidectomy 03/28/2021   Thyroid nodule    Blue toe syndrome of left lower extremity ([place]) 10/01/2020   Schamberg's purpura 10/09/2019   Leg swelling 10/09/2019   Benign positional vertigo 02/03/2016   Common migraine with intractable migraine 06/18/2015   Anxiety 05/04/2015   Adaptation reaction 05/03/2015   DDD (degenerative disc disease), lumbosacral 05/03/2015   Hypercholesteremia 05/03/2015   Insomnia due to medical condition 05/03/2015   Adiposity 05/03/2015   Disorder of peripheral nervous system 05/03/2015   Snores 05/03/2015   Diabetic peripheral neuropathy ([place]) 03/06/2015   Essential hypertension 03/06/2015   Elevated liver function tests 03/06/2015   Obesity 03/06/2015   GERD (gastroesophageal reflux disease) 03/06/2015   Multinodular goiter 03/06/2015   RLS (restless legs syndrome) 03/06/2015   Chronic low back pain (2ry area of Pain) (Bilateral) (L>R) w/o sciatica 03/06/2015   Leg cramps 03/06/2015    PCP: [name], FNP  REFERRING PROVIDER: [name], FNP  REFERRING DIAG: [phone] (ICD-10-CM) - Fall, subsequent encounter  THERAPY DIAG:  Muscle weakness (generalized)  Difficulty in walking, not elsewhere classified  Other abnormalities of gait and mobility  Rationale for Evaluation and Treatment Rehabilitation  ONSET DATE: 06/18/22  SUBJECTIVE:   SUBJECTIVE STATEMENT: Doing ok. Screws are painful today. She was surprisingly sore after last session. She feels better today since she missed last session.   PERTINENT HISTORY: Pt reports her fall initially occurred in the  evening when her husband was not home. Pt got up and had her Rolator and she walked toward her hallway. When she turned around to turn the light on she went completely down on her R leg and broke her femur distally and twisted her knee. When EMT arrived she had to straighten her LE to immobilize it. Pt went to [facility] hospital for the trauma.   Upon arrival she was evaluated and had to get MRI and imaging. Progressed to 06/19/22. Pt had surgery that night. Pt had to have plate and screws in her knee and rod in her femur. Pt was in hospital for 11 days then progressed to [facility] for [facility] days. [facility].   Upon coming home with [facility] pt came home with catheter. Pt came home and was relieved. Pt husband helps with everything doing "everything" for her except she helps with cooking.   Pt considered home health physical therapy but the cost of care would be too high ( $125 per visit) and this.   Prior to injury pt was ambulating with rolator. Pt used bedside commode at that time.  Pt reports 6 falls during this year. Pt reports 2-3 falls with rolator and 2-3 falls walking between bathroom and bedroom and 1 fall in bedroom.   Pt had another fall at [place] where she broke her left humerus about 2 years ago.   PAIN:  Are you having pain? No  PRECAUTIONS: Other: WBAT  WEIGHT BEARING RESTRICTIONS     Gardenia Phlegm, MD MPH - 09/03/2022 3:15 PM EDT   Formatting of this note might be different from the original. At this time, you can continue weightbearing as tolerated to right lower extremity. Please continue working on range of motion and strengthening. We have given you prescription for physical therapy.   FALLS:  Has patient fallen in last 6 months? Yes. 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 Pt does not know if she will be able to sit in one position for that long. Not sure if it will be feasible to get back to work.   PLOF: Requires assistive device for independence, Needs assistance with ADLs, Needs assistance with homemaking, and Needs assistance with gait  PATIENT GOALS Pt would like to get legs moving a little bit more. Pt wants to be able to get to bathroom on her own.  Get upper body strength improved.      TODAY'S TREATMENT:  Cable resisted strenghtening from WC:  -seated hamstrings flexion RLE 7.5lb  1x15, 4.5lb LLE 1x15 (pillowcase under foot)  -LAQ x15, A/ROM RLE; LLE heel slide into TKE 1x15 -seated hamstrings flexion RLE 7.5lb  1x15, 4.5lb LLE 1x15 (pillowcase under foot)  -LAQ x15, A/ROM RLE; LLE heel slide into TKE 1x15 -seated hamstrings flexion RLE 7.5lb  1x15 LAQ x15, A/ROM RLE  ----- -seated RLE marching AA/ROM 1x15 bilat -seated hip extension 17.5lb 1x15, 1x15 @ 12.5 LLE -seated RLE marching AA/ROM 1x15 bilat -seated hip extension 17.5lb 1x15, 1x15 @ 12.5 LLE  ----- Legs strapped in to STS lift, attempted a few variations of a pull-to-stand transfer. Pt lacks sufficient for a pull transfer with modA.   Seated downward row:  -1x25 @ 17.5lb  -1x15 @ 22.5lb      PATIENT EDUCATION:  Education details: HEP, POC Person educated: Patient Education method: Explanation Education comprehension: verbalized understanding   HOME EXERCISE PROGRAM: Access Code: [email or web address] URL: [email or web address] Date: 09/28/2022 Prepared by: Rivka Barbara  Exercises - Seated March  - 1 x daily - 7 x weekly - 2 sets - 10 reps - Seated Long Arc Quad  - 1 x daily - 7 x weekly - 2 sets - 10 reps - Seated Heel Raise  - 1 x daily - 7 x  weekly - 2 sets - 20 reps - Seated hip abduction with band  - 1 x daily - 7 x weekly - 2 sets - 10 reps - Seated Hip Adduction Isometrics with Ball  - 1 x daily - 7 x weekly - 2 sets - 10 reps  ASSESSMENT:  CLINICAL IMPRESSION: Continued with isolated strengthening via cable resisted RLE strengthening. Attempted a pull to stand transfers at end of session with bila tknee block, but pt's arm strength is not adequate for  this type of transfer. Integrated some pull strengthening into program for future pull transfers. Pt will continue to benefit from skilled physical therapy intervention to address impairments, improve QOL, and attain therapy goals.     OBJECTIVE IMPAIRMENTS Abnormal gait, decreased activity tolerance, decreased balance, decreased endurance, decreased mobility, difficulty walking, decreased ROM, decreased strength, impaired perceived functional ability, and impaired sensation.   ACTIVITY LIMITATIONS carrying, lifting, bending, standing, squatting, stairs, transfers, bed mobility, bathing, toileting, dressing, self feeding, hygiene/grooming, and locomotion level  PARTICIPATION LIMITATIONS: meal prep, cleaning, laundry, driving, shopping, community activity, occupation, and yard work  PERSONAL FACTORS 3+ comorbidities: Polyneuropathy, hypertension, hyperlipidemia, diabetes mellitus, arthritis  are also affecting patient's functional outcome.   REHAB POTENTIAL: Fair secondary to comorbidities above particularly polyneuropathy affecting her lower extremity strength and muscle activation  CLINICAL DECISION MAKING: Evolving/moderate complexity  EVALUATION COMPLEXITY: Moderate   GOALS: Goals reviewed with patient? Yes  SHORT TERM GOALS: Target date: 10/26/2022     Patient will be independent in home exercise program to improve strength/mobility for better functional independence with ADLs. Baseline: No HEP currently  Goal status: INITIAL   LONG TERM GOALS: Target date:  01/11/2023   Patient will be modified independent for slide board transfers in order to improve her ability to transfer to and from wheelchair as well as bedside commode for improvement in ADLs. Baseline: Patient currently requires assistance with slide board transfers is unable to utilize bedside commode and requires use of catheter and bedpan Goal status: INITIAL  2.  Patient will perform sit to and from stand with min assist from physical therapist in order to indicate improved lower extremity functional strength and improved ability to perform functional transfers and functional leg activities within her home. Baseline: Patient unable to stand independently at this time due to lower extremity weakness Goal status: INITIAL  3.  Patient will improve focus on therapeutic outcome score to 35 or greater in order to indicate improved perceived functional ability Baseline: 19 Goal status: INITIAL  4.  Patient will perform stand pivot transfer from wheelchair to mat table with moderate assistance or less in order to improve her ability to perform transfers within the home to improve her overall functional capacity. Baseline:  Goal status: INITIAL     PLAN: PT FREQUENCY: 2x/week  PT DURATION: 12 weeks  PLANNED INTERVENTIONS: Therapeutic exercises, Therapeutic activity, Neuromuscular re-education, Balance training, Gait training, Patient/Family education, Self Care, Joint mobilization, Stair training, DME instructions, and Electrical stimulation  PLAN FOR NEXT SESSION: interventions to improve slide board transfer efficacy independently as well as intervention to improve strength and efficacy for future completion of stand pivot transfers. Bed mobility and transfer training as appropriate. May need x 2 max for standing initially.    4:13 PM, 10/19/22 Etta Grandchild, PT, DPT Physical Therapist - [place] [phone]     [place] C, PT 10/19/2022, 4:13 PM

## 2022-10-20 ENCOUNTER — Other Ambulatory Visit: Payer: Self-pay | Admitting: Family Medicine

## 2022-10-21 ENCOUNTER — Ambulatory Visit: Payer: BLUE CROSS/BLUE SHIELD | Attending: Orthopedic Surgery | Admitting: Physical Therapy

## 2022-10-21 DIAGNOSIS — R2689 Other abnormalities of gait and mobility: Secondary | ICD-10-CM | POA: Diagnosis present

## 2022-10-21 DIAGNOSIS — R262 Difficulty in walking, not elsewhere classified: Secondary | ICD-10-CM | POA: Diagnosis present

## 2022-10-21 DIAGNOSIS — M6281 Muscle weakness (generalized): Secondary | ICD-10-CM | POA: Insufficient documentation

## 2022-10-21 NOTE — Therapy (Signed)
OUTPATIENT PHYSICAL THERAPY LOWER EXTREMITY treatment    Patient Name: Gabriella Marsh MRN: [id number] DOB:1966-12-12, 56 y.o., female Today's Date: 10/21/2022   PT End of Session - 10/21/22 1140     Visit Number 6    Number of Visits 24    Date for PT Re-Evaluation 12/21/22    Authorization Type BCBS Other    Authorization Time Period 09/28/22-12/21/22    Progress Note Due on Visit 10    PT Start Time 5638    PT Stop Time 1225    PT Time Calculation (min) 43 min    Activity Tolerance Patient tolerated treatment well;No increased pain    Behavior During Therapy WFL for tasks assessed/performed               Past Medical History:  Diagnosis Date   Arthritis    knees   Blood clotting disorder ([place])    on toe    Degenerative disc disease, lumbar    Diabetes mellitus without complication (HCC)    GERD (gastroesophageal reflux disease)    Headache    daily - AM (has not been able to have SPG blocks lately)   Hyperlipidemia    Hypertension    Neuropathy    feet   Vertigo    3-4x/yr   Past Surgical History:  Procedure Laterality Date   ABDOMINAL HYSTERECTOMY     But still has cervix   AMPUTATION TOE Right 07/24/2021   Procedure: AMPUTATION TOE;  Surgeon: Sharlotte Alamo, DPM;  Location: [facility] ORS;  Service: Podiatry;  Laterality: Right;   CESAREAN SECTION     CHOLECYSTECTOMY     COLONOSCOPY WITH PROPOFOL N/A 02/20/2022   Procedure: COLONOSCOPY WITH PROPOFOL;  Surgeon: [name], MD;  Location: [facility];  Service: Gastroenterology;  Laterality: N/A;   left arm frature  10/12/2020   OVARIAN CYST SURGERY     PARATHYROIDECTOMY  03/28/2021   Procedure: PARATHYROIDECTOMY AUTOTRANSPLANT;  Surgeon: Fredirick Maudlin, MD;  Location: [facility] ORS;  Service: General;;   right femur surgery Right    SHOULDER SURGERY Left 12/26/014   Dr. Little Ishikawa, [place] N/A 03/28/2021   Procedure: THYROIDECTOMY, total;  Surgeon: Fredirick Maudlin, MD;  Location: [facility]  ORS;  Service: General;  Laterality: N/A;  Provider requesting 3 hours /180 minutes for procedure   TONSILLECTOMY AND ADENOIDECTOMY     UTERINE FIBROID SURGERY     Patient Active Problem List   Diagnosis Date Noted   Need for influenza vaccination 09/10/2022   Primary insomnia 09/10/2022   Impaired functional mobility, balance, gait, and endurance 06/09/2022   Type 2 diabetes mellitus with diabetic neuropathy, without long-term current use of insulin ([place]) 04/24/2022   Acquired hypothyroidism 04/24/2022   Hyperlipidemia associated with type 2 diabetes mellitus ([place]) 04/24/2022   Fall 04/24/2022   Dizziness 04/24/2022   Iron deficiency anemia 04/24/2022   Colon cancer screening 01/26/2022   Encounter for screening mammogram for malignant neoplasm of breast 01/26/2022   Chronic sensorimotor polyneuropathy with axonal and demyelinating features 01/12/2022   Osteoarthritis of knees (Bilateral) 01/12/2022   Tricompartment osteoarthritis of knees (Bilateral) 01/12/2022    Grade 1 Retrolisthesis (47m) of L5 over S1 01/12/2022   History of proximal humerus fracture (Left) 01/12/2022   Vitamin D deficiency 01/12/2022   Elevated hemoglobin A1c 01/12/2022   Hypochloremia 01/12/2022   Hyperglycemia 01/12/2022   Chronic peripheral neuropathic pain 01/12/2022   Type 2 diabetes mellitus with peripheral neuropathy ([place] 01/12/2022   Glands swollen 01/09/2022  Acute cough 01/09/2022   Acute non-recurrent pansinusitis 01/09/2022   Rhinorrhea 01/09/2022   Left acute otitis media 01/09/2022   Pharmacologic therapy 11/12/2021   Disorder of skeletal system 11/12/2021   Problems influencing health status 11/12/2021   Chronic hand pain (1ry area of Pain) (Bilateral) (L>R) 11/12/2021   Chronic feet pain (3ry area of Pain) (Bilateral) (L>R) 11/12/2021   Chronic shoulder pain (4th area of Pain) (Left) 11/12/2021   Chronic knee pain (5th area of Pain) (Bilateral) (L>R) 11/12/2021   Lumbar facet syndrome  (Bilateral) 11/12/2021   Muscle spasm 10/22/2021   Type 2 diabetes mellitus with microalbuminuria, without long-term current use of insulin ([place]) 10/22/2021   Diabetic foot infection ([place]) 07/22/2021   Depression with anxiety 07/22/2021   Sepsis ([place]) 07/22/2021   COVID-19 virus infection 07/22/2021   Hypocalcemia 07/17/2021   Chronic pain syndrome 07/17/2021   S/P total thyroidectomy 03/28/2021   Thyroid nodule    Blue toe syndrome of left lower extremity ([place]) 10/01/2020   Schamberg's purpura 10/09/2019   Leg swelling 10/09/2019   Benign positional vertigo 02/03/2016   Common migraine with intractable migraine 06/18/2015   Anxiety 05/04/2015   Adaptation reaction 05/03/2015   DDD (degenerative disc disease), lumbosacral 05/03/2015   Hypercholesteremia 05/03/2015   Insomnia due to medical condition 05/03/2015   Adiposity 05/03/2015   Disorder of peripheral nervous system 05/03/2015   Snores 05/03/2015   Diabetic peripheral neuropathy ([place]) 03/06/2015   Essential hypertension 03/06/2015   Elevated liver function tests 03/06/2015   Obesity 03/06/2015   GERD (gastroesophageal reflux disease) 03/06/2015   Multinodular goiter 03/06/2015   RLS (restless legs syndrome) 03/06/2015   Chronic low back pain (2ry area of Pain) (Bilateral) (L>R) w/o sciatica 03/06/2015   Leg cramps 03/06/2015    PCP: [name], FNP  REFERRING PROVIDER: [name], FNP  REFERRING DIAG: [phone] (ICD-10-CM) - Fall, subsequent encounter  THERAPY DIAG:  Muscle weakness (generalized)  Difficulty in walking, not elsewhere classified  Other abnormalities of gait and mobility  Rationale for Evaluation and Treatment Rehabilitation  ONSET DATE: 06/18/22  SUBJECTIVE:   SUBJECTIVE STATEMENT: Pt reports she is doing well today but had severe pain limiting her ability to sleep or rest following last session. Pt educated regarding DOMS and the location of pain was evident to be muscular in origin.    PERTINENT HISTORY: Pt reports her fall initially occurred in the evening when her husband was not home. Pt got up and had her Rolator and she walked toward her hallway. When she turned around to turn the light on she went completely down on her R leg and broke her femur distally and twisted her knee. When EMT arrived she had to straighten her LE to immobilize it. Pt went to [facility] hospital for the trauma.   Upon arrival she was evaluated and had to get MRI and imaging. Progressed to 06/19/22. Pt had surgery that night. Pt had to have plate and screws in her knee and rod in her femur. Pt was in hospital for 11 days then progressed to [facility] days. [facility].   Upon coming home with [facility] pt came home with catheter. Pt came home and was relieved. Pt husband helps with everything doing "everything" for her except she helps with cooking.   Pt considered home health physical therapy but the cost of care would be too high ( $125 per visit) and this.  Prior to injury pt was ambulating with rolator. Pt used bedside commode at that time. Pt reports 6 falls during this year. Pt reports 2-3 falls with rolator and 2-3 falls walking between bathroom and bedroom and 1 fall in bedroom.   Pt had another fall at [place] where she broke her left humerus about 2 years ago.   PAIN:  Are you having pain? No  PRECAUTIONS: Other: WBAT  WEIGHT BEARING RESTRICTIONS     Gardenia Phlegm, MD MPH - 09/03/2022 3:15 PM EDT   Formatting of this note might be different from the original. At this time, you can continue weightbearing as tolerated to right lower extremity. Please continue working on range of motion and strengthening. We have given you prescription for physical therapy.   FALLS:  Has patient fallen in last 6 months? Yes. 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 Pt does not know if she will be able to sit in one position for that long. Not sure if it will be feasible to get back to work.   PLOF: Requires assistive device for independence, Needs assistance with ADLs, Needs assistance with homemaking, and Needs assistance with gait  PATIENT GOALS Pt would like to get legs moving a little bit more. Pt wants to be able to get to bathroom on her own.  Get upper body strength improved.      TODAY'S TREATMENT:  LAQ 3 x 10 2# on R and AAROM on L  ----- -seated RLE marching AA/ROM 1x15 bilat 2# AW on R AARom on left 3 x 15 reps ea LE   HS curls RTB 3 x 15 R LE, YTB 3 x 15 L LE     ----- STS lift to standing position for total of 2 minutes.  During 2-minute time patient completed 30 seconds of glutes sets as well as 45 seconds of left to right lateral weight shifting with focus on hip muscle activation for weight shifts.  Patient unable to   Seated downward row:  -1 x 15 @ 17.5lb  -2 x 15 @ 22.5lb      PATIENT EDUCATION:  Education details: HEP, POC Person educated: Patient Education method: Explanation Education comprehension: verbalized understanding   HOME EXERCISE PROGRAM: Access Code: [email or web address] URL: [email or web address] Date: 09/28/2022 Prepared by: Rivka Barbara  Exercises - Seated March  - 1 x daily - 7 x weekly - 2 sets - 10 reps - Seated Long Arc Quad  - 1 x daily - 7 x weekly - 2 sets - 10 reps - Seated Heel Raise  - 1 x daily - 7 x weekly - 2 sets - 20 reps - Seated hip abduction with band  - 1 x daily - 7 x weekly - 2 sets - 10 reps - Seated Hip Adduction Isometrics with Ball  - 1 x daily - 7 x weekly - 2 sets - 10  reps  ASSESSMENT:  CLINICAL IMPRESSION: Patient continues with lower extremity generalized strengthening this session.  Patient reports significant pain and discomfort in her hamstrings following last session to several exercises were lightened up and resistance in order to improve complaints  of significant delayed onset muscle soreness.  Patient also completes several minutes of standing and sit to stand stander in order to improve her lower extremity weightbearing capacity.  In standing patient unable to straighten left knee into full extension but is able to shift her weight fairly.Pt will continue to benefit from skilled physical therapy intervention to address impairments, improve QOL, and attain therapy goals.     OBJECTIVE IMPAIRMENTS Abnormal gait, decreased activity tolerance, decreased balance, decreased endurance, decreased mobility, difficulty walking, decreased ROM, decreased strength, impaired perceived functional ability, and impaired sensation.   ACTIVITY LIMITATIONS carrying, lifting, bending, standing, squatting, stairs, transfers, bed mobility, bathing, toileting, dressing, self feeding, hygiene/grooming, and locomotion level  PARTICIPATION LIMITATIONS: meal prep, cleaning, laundry, driving, shopping, community activity, occupation, and yard work  PERSONAL FACTORS 3+ comorbidities: Polyneuropathy, hypertension, hyperlipidemia, diabetes mellitus, arthritis  are also affecting patient's functional outcome.   REHAB POTENTIAL: Fair secondary to comorbidities above particularly polyneuropathy affecting her lower extremity strength and muscle activation  CLINICAL DECISION MAKING: Evolving/moderate complexity  EVALUATION COMPLEXITY: Moderate   GOALS: Goals reviewed with patient? Yes  SHORT TERM GOALS: Target date: 10/26/2022     Patient will be independent in home exercise program to improve strength/mobility for better functional independence with ADLs. Baseline: No HEP  currently  Goal status: INITIAL   LONG TERM GOALS: Target date: 01/13/2023   Patient will be modified independent for slide board transfers in order to improve her ability to transfer to and from wheelchair as well as bedside commode for improvement in ADLs. Baseline: Patient currently requires assistance with slide board transfers is unable to utilize bedside commode and requires use of catheter and bedpan Goal status: INITIAL  2.  Patient will perform sit to and from stand with min assist from physical therapist in order to indicate improved lower extremity functional strength and improved ability to perform functional transfers and functional leg activities within her home. Baseline: Patient unable to stand independently at this time due to lower extremity weakness Goal status: INITIAL  3.  Patient will improve focus on therapeutic outcome score to 35 or greater in order to indicate improved perceived functional ability Baseline: 19 Goal status: INITIAL  4.  Patient will perform stand pivot transfer from wheelchair to mat table with moderate assistance or less in order to improve her ability to perform transfers within the home to improve her overall functional capacity. Baseline:  Goal status: INITIAL     PLAN: PT FREQUENCY: 2x/week  PT DURATION: 12 weeks  PLANNED INTERVENTIONS: Therapeutic exercises, Therapeutic activity, Neuromuscular re-education, Balance training, Gait training, Patient/Family education, Self Care, Joint mobilization, Stair training, DME instructions, and Electrical stimulation  PLAN FOR NEXT SESSION: interventions to improve slide board transfer efficacy independently as well as intervention to improve strength and efficacy for future completion of stand pivot transfers. Bed mobility and transfer training as appropriate. May need x 2 max for standing initially.    2:42 PM, 10/21/22   Particia Lather, PT 10/21/2022, 2:42 PM

## 2022-10-26 ENCOUNTER — Encounter: Payer: Self-pay | Admitting: Physical Therapy

## 2022-10-26 ENCOUNTER — Ambulatory Visit: Payer: BLUE CROSS/BLUE SHIELD

## 2022-10-26 DIAGNOSIS — M6281 Muscle weakness (generalized): Secondary | ICD-10-CM

## 2022-10-26 DIAGNOSIS — R262 Difficulty in walking, not elsewhere classified: Secondary | ICD-10-CM

## 2022-10-26 DIAGNOSIS — R2689 Other abnormalities of gait and mobility: Secondary | ICD-10-CM

## 2022-10-26 NOTE — Therapy (Signed)
OUTPATIENT PHYSICAL THERAPY LOWER EXTREMITY TREATMENT   Patient Name: Gabriella Marsh MRN: [id number] DOB:01-02-1966, 56 y.o., female Today's Date: 10/26/2022   PT End of Session - 10/26/22 1603     Visit Number 7    Number of Visits 24    Date for PT Re-Evaluation 12/21/22    Authorization Type [employer] Other    Authorization Time Period 09/28/22-12/21/22    Progress Note Due on Visit 10    PT Start Time 1602    PT Stop Time 1324    PT Time Calculation (min) 43 min    Activity Tolerance Patient tolerated treatment well;No increased pain    Behavior During Therapy WFL for tasks assessed/performed               Past Medical History:  Diagnosis Date   Arthritis    knees   Blood clotting disorder ([place])    on toe    Degenerative disc disease, lumbar    Diabetes mellitus without complication (HCC)    GERD (gastroesophageal reflux disease)    Headache    daily - AM (has not been able to have SPG blocks lately)   Hyperlipidemia    Hypertension    Neuropathy    feet   Vertigo    3-4x/yr   Past Surgical History:  Procedure Laterality Date   ABDOMINAL HYSTERECTOMY     But still has cervix   AMPUTATION TOE Right 07/24/2021   Procedure: AMPUTATION TOE;  Surgeon: Sharlotte Alamo, DPM;  Location: [facility] ORS;  Service: Podiatry;  Laterality: Right;   CESAREAN SECTION     CHOLECYSTECTOMY     COLONOSCOPY WITH PROPOFOL N/A 02/20/2022   Procedure: COLONOSCOPY WITH PROPOFOL;  Surgeon: [name], MD;  Location: [facility] ENDOSCOPY;  Service: Gastroenterology;  Laterality: N/A;   left arm frature  10/12/2020   OVARIAN CYST SURGERY     PARATHYROIDECTOMY  03/28/2021   Procedure: PARATHYROIDECTOMY AUTOTRANSPLANT;  Surgeon: Fredirick Maudlin, MD;  Location: [facility] ORS;  Service: General;;   right femur surgery Right    SHOULDER SURGERY Left 12/26/014   Dr. Little Ishikawa, [place] N/A 03/28/2021   Procedure: THYROIDECTOMY, total;  Surgeon: Fredirick Maudlin, MD;  Location: [facility] ORS;   Service: General;  Laterality: N/A;  Provider requesting 3 hours /180 minutes for procedure   TONSILLECTOMY AND ADENOIDECTOMY     UTERINE FIBROID SURGERY     Patient Active Problem List   Diagnosis Date Noted   Need for influenza vaccination 09/10/2022   Primary insomnia 09/10/2022   Impaired functional mobility, balance, gait, and endurance 06/09/2022   Type 2 diabetes mellitus with diabetic neuropathy, without long-term current use of insulin ([place]) 04/24/2022   Acquired hypothyroidism 04/24/2022   Hyperlipidemia associated with type 2 diabetes mellitus ([place]) 04/24/2022   Fall 04/24/2022   Dizziness 04/24/2022   Iron deficiency anemia 04/24/2022   Colon cancer screening 01/26/2022   Encounter for screening mammogram for malignant neoplasm of breast 01/26/2022   Chronic sensorimotor polyneuropathy with axonal and demyelinating features 01/12/2022   Osteoarthritis of knees (Bilateral) 01/12/2022   Tricompartment osteoarthritis of knees (Bilateral) 01/12/2022    Grade 1 Retrolisthesis (56m) of L5 over S1 01/12/2022   History of proximal humerus fracture (Left) 01/12/2022   Vitamin D deficiency 01/12/2022   Elevated hemoglobin A1c 01/12/2022   Hypochloremia 01/12/2022   Hyperglycemia 01/12/2022   Chronic peripheral neuropathic pain 01/12/2022   Type 2 diabetes mellitus with peripheral neuropathy ([place] 01/12/2022   Glands swollen 01/09/2022  Acute cough 01/09/2022   Acute non-recurrent pansinusitis 01/09/2022   Rhinorrhea 01/09/2022   Left acute otitis media 01/09/2022   Pharmacologic therapy 11/12/2021   Disorder of skeletal system 11/12/2021   Problems influencing health status 11/12/2021   Chronic hand pain (1ry area of Pain) (Bilateral) (L>R) 11/12/2021   Chronic feet pain (3ry area of Pain) (Bilateral) (L>R) 11/12/2021   Chronic shoulder pain (4th area of Pain) (Left) 11/12/2021   Chronic knee pain (5th area of Pain) (Bilateral) (L>R) 11/12/2021   Lumbar facet syndrome  (Bilateral) 11/12/2021   Muscle spasm 10/22/2021   Type 2 diabetes mellitus with microalbuminuria, without long-term current use of insulin ([place]) 10/22/2021   Diabetic foot infection ([place]) 07/22/2021   Depression with anxiety 07/22/2021   Sepsis ([place]) 07/22/2021   COVID-19 virus infection 07/22/2021   Hypocalcemia 07/17/2021   Chronic pain syndrome 07/17/2021   S/P total thyroidectomy 03/28/2021   Thyroid nodule    Blue toe syndrome of left lower extremity ([place]) 10/01/2020   Schamberg's purpura 10/09/2019   Leg swelling 10/09/2019   Benign positional vertigo 02/03/2016   Common migraine with intractable migraine 06/18/2015   Anxiety 05/04/2015   Adaptation reaction 05/03/2015   DDD (degenerative disc disease), lumbosacral 05/03/2015   Hypercholesteremia 05/03/2015   Insomnia due to medical condition 05/03/2015   Adiposity 05/03/2015   Disorder of peripheral nervous system 05/03/2015   Snores 05/03/2015   Diabetic peripheral neuropathy ([place]) 03/06/2015   Essential hypertension 03/06/2015   Elevated liver function tests 03/06/2015   Obesity 03/06/2015   GERD (gastroesophageal reflux disease) 03/06/2015   Multinodular goiter 03/06/2015   RLS (restless legs syndrome) 03/06/2015   Chronic low back pain (2ry area of Pain) (Bilateral) (L>R) w/o sciatica 03/06/2015   Leg cramps 03/06/2015    PCP: [name], FNP  REFERRING PROVIDER: [name], FNP  REFERRING DIAG: [phone] (ICD-10-CM) - Fall, subsequent encounter  THERAPY DIAG:  Muscle weakness (generalized)  Other abnormalities of gait and mobility  Difficulty in walking, not elsewhere classified  Rationale for Evaluation and Treatment Rehabilitation  ONSET DATE: 06/18/22  SUBJECTIVE:   SUBJECTIVE STATEMENT: Pt reports able to attend a race over the weekend. No pain, just stiffness in R hip. Felt really good after last session.   PERTINENT HISTORY: Pt reports her fall initially occurred in the evening when  her husband was not home. Pt got up and had her Rolator and she walked toward her hallway. When she turned around to turn the light on she went completely down on her R leg and broke her femur distally and twisted her knee. When EMT arrived she had to straighten her LE to immobilize it. Pt went to [facility] hospital for the trauma.   Upon arrival she was evaluated and had to get MRI and imaging. Progressed to 06/19/22. Pt had surgery that night. Pt had to have plate and screws in her knee and rod in her femur. Pt was in hospital for 11 days then progressed to [facility] for [facility] days. [facility].   Upon coming home with [facility] pt came home with catheter. Pt came home and was relieved. Pt husband helps with everything doing "everything" for her except she helps with cooking.   Pt considered home health physical therapy but the cost of care would be too high ( $125 per visit) and this.   Prior to injury pt was ambulating with rolator. Pt used bedside commode at that  time. Pt reports 6 falls during this year. Pt reports 2-3 falls with rolator and 2-3 falls walking between bathroom and bedroom and 1 fall in bedroom.   Pt had another fall at [place] where she broke her left humerus about 2 years ago.   PAIN:  Are you having pain? No  PRECAUTIONS: Other: WBAT  WEIGHT BEARING RESTRICTIONS     Gardenia Phlegm, MD MPH - 09/03/2022 3:15 PM EDT   Formatting of this note might be different from the original. At this time, you can continue weightbearing as tolerated to right lower extremity. Please continue working on range of motion and strengthening. We have given you prescription for physical therapy.   FALLS:  Has patient fallen in last 6 months? Yes. 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 Pt does not know if she will be able to sit in one position for that long. Not sure if it will be feasible to get back to work.   PLOF: Requires assistive device for independence, Needs assistance with ADLs, Needs assistance with homemaking, and Needs assistance with gait  PATIENT GOALS Pt would like to get legs moving a little bit more. Pt wants to be able to get to bathroom on her own.  Get upper body strength improved.      TODAY'S TREATMENT:  LAQ 3 x 10 2# on R and AAROM on L no resistance.  HS curls GTB 3 x 15 RLE, YTB 1 x 15 LLE, 2x15 RLE.  Use of pillow case underneath each shoe to reduce friction  Tricep dips in w/c: 2x10. PT behind w/c to prevent post tilting.  Forward ball roll outs for skin integrity and anterior weight shifting for future STS efforts. X20.   STS lift to standing position for total of 4 minutes. Performing R/L lateral weight shifts and glut sets during standing bout. Tried for 5 min but pt endorsing LE fatigue and need to return to w/c.   PATIENT EDUCATION:  Education details: HEP, POC Person educated: Patient Education method: Explanation Education comprehension: verbalized understanding   HOME EXERCISE PROGRAM: Access Code: [email or web address] URL: [email or web address] Date: 09/28/2022 Prepared by: Rivka Barbara  Exercises - Seated March  - 1 x daily - 7 x weekly - 2 sets - 10 reps - Seated Long Arc Quad  - 1 x daily - 7 x weekly - 2 sets - 10 reps - Seated Heel Raise  - 1 x daily - 7 x weekly - 2 sets - 20 reps - Seated hip abduction with band  - 1 x daily - 7 x weekly - 2 sets - 10 reps - Seated Hip Adduction Isometrics with Ball  - 1 x daily - 7 x weekly - 2 sets - 10 reps  ASSESSMENT:  CLINICAL IMPRESSION: Maintaining current PT  POC with addition of anterior weight shifts and w/c dips to assist in skin integrity and improve mechanics with STS transfers. Pt progressing in tolerance for STS lift for a total of 4 minutes today before needing seated rest break. Used to improve tolerance in weightbearing on RLE and LLE but remains limited in L quad function with  knee varus needing a couple bouts of manual facilitation to position in more neutral position. Pt will continue to benefit from skilled physical therapy intervention to address impairments, improve QOL, and attain therapy goals.   OBJECTIVE IMPAIRMENTS Abnormal gait, decreased activity tolerance, decreased balance, decreased endurance, decreased mobility, difficulty walking, decreased ROM, decreased strength, impaired perceived functional ability, and impaired sensation.   ACTIVITY LIMITATIONS carrying, lifting, bending, standing, squatting, stairs, transfers, bed mobility, bathing, toileting, dressing, self feeding, hygiene/grooming, and locomotion level  PARTICIPATION LIMITATIONS: meal prep, cleaning, laundry, driving, shopping, community activity, occupation, and yard work  PERSONAL FACTORS 3+ comorbidities: Polyneuropathy, hypertension, hyperlipidemia, diabetes mellitus, arthritis  are also affecting patient's functional outcome.   REHAB POTENTIAL: Fair secondary to comorbidities above particularly polyneuropathy affecting her lower extremity strength and muscle activation  CLINICAL DECISION MAKING: Evolving/moderate complexity  EVALUATION COMPLEXITY: Moderate   GOALS: Goals reviewed with patient? Yes  SHORT TERM GOALS: Target date: 10/26/2022     Patient will be independent in home exercise program to improve strength/mobility for better functional independence with ADLs. Baseline: No HEP currently  Goal status: INITIAL   LONG TERM GOALS: Target date: 01/18/2023   Patient will be modified independent for slide board transfers in order to improve her  ability to transfer to and from wheelchair as well as bedside commode for improvement in ADLs. Baseline: Patient currently requires assistance with slide board transfers is unable to utilize bedside commode and requires use of catheter and bedpan Goal status: INITIAL  2.  Patient will perform sit to and from stand with min assist from physical therapist in order to indicate improved lower extremity functional strength and improved ability to perform functional transfers and functional leg activities within her home. Baseline: Patient unable to stand independently at this time due to lower extremity weakness Goal status: INITIAL  3.  Patient will improve focus on therapeutic outcome score to 35 or greater in order to indicate improved perceived functional ability Baseline: 19 Goal status: INITIAL  4.  Patient will perform stand pivot transfer from wheelchair to mat table with moderate assistance or less in order to improve her ability to perform transfers within the home to improve her overall functional capacity. Baseline:  Goal status: INITIAL     PLAN: PT FREQUENCY: 2x/week  PT DURATION: 12 weeks  PLANNED INTERVENTIONS: Therapeutic exercises, Therapeutic activity, Neuromuscular re-education, Balance training, Gait training, Patient/Family education, Self Care, Joint mobilization, Stair training, DME instructions, and Electrical stimulation  PLAN FOR NEXT SESSION: interventions to improve slide board transfer efficacy independently as well as intervention to improve strength and efficacy for future completion of stand pivot transfers. Bed mobility and transfer training as appropriate. May need x 2 max for standing initially.    Salem Caster. Fairly IV, PT, DPT Physical Therapist- [facility]  10/26/2022, 6:36 PM

## 2022-10-29 ENCOUNTER — Encounter: Payer: Self-pay | Admitting: Physical Therapy

## 2022-10-29 ENCOUNTER — Ambulatory Visit: Payer: BLUE CROSS/BLUE SHIELD | Admitting: Physical Therapy

## 2022-10-29 DIAGNOSIS — R262 Difficulty in walking, not elsewhere classified: Secondary | ICD-10-CM

## 2022-10-29 DIAGNOSIS — M6281 Muscle weakness (generalized): Secondary | ICD-10-CM

## 2022-10-29 DIAGNOSIS — R2689 Other abnormalities of gait and mobility: Secondary | ICD-10-CM

## 2022-10-29 NOTE — Therapy (Signed)
OUTPATIENT PHYSICAL THERAPY LOWER EXTREMITY TREATMENT   Patient Name: Gabriella Marsh MRN: [id number] DOB:1966/12/11, 55 y.o., female Today's Date: 10/29/2022   PT End of Session - 10/29/22 1546     Visit Number 8    Number of Visits 24    Date for PT Re-Evaluation 12/21/22    Authorization Type [employer] Other    Authorization Time Period 09/28/22-12/21/22    Progress Note Due on Visit 10    PT Start Time 2831    PT Stop Time 5176    PT Time Calculation (min) 45 min    Activity Tolerance Patient tolerated treatment well;No increased pain    Behavior During Therapy WFL for tasks assessed/performed                Past Medical History:  Diagnosis Date   Arthritis    knees   Blood clotting disorder ([place])    on toe    Degenerative disc disease, lumbar    Diabetes mellitus without complication (HCC)    GERD (gastroesophageal reflux disease)    Headache    daily - AM (has not been able to have SPG blocks lately)   Hyperlipidemia    Hypertension    Neuropathy    feet   Vertigo    3-4x/yr   Past Surgical History:  Procedure Laterality Date   ABDOMINAL HYSTERECTOMY     But still has cervix   AMPUTATION TOE Right 07/24/2021   Procedure: AMPUTATION TOE;  Surgeon: Sharlotte Alamo, DPM;  Location: [facility] ORS;  Service: Podiatry;  Laterality: Right;   CESAREAN SECTION     CHOLECYSTECTOMY     COLONOSCOPY WITH PROPOFOL N/A 02/20/2022   Procedure: COLONOSCOPY WITH PROPOFOL;  Surgeon: [name], MD;  Location: [facility] ENDOSCOPY;  Service: Gastroenterology;  Laterality: N/A;   left arm frature  10/12/2020   OVARIAN CYST SURGERY     PARATHYROIDECTOMY  03/28/2021   Procedure: PARATHYROIDECTOMY AUTOTRANSPLANT;  Surgeon: Fredirick Maudlin, MD;  Location: [facility] ORS;  Service: General;;   right femur surgery Right    SHOULDER SURGERY Left 12/26/014   Dr. Little Ishikawa, [place] N/A 03/28/2021   Procedure: THYROIDECTOMY, total;  Surgeon: Fredirick Maudlin, MD;  Location: [facility]  ORS;  Service: General;  Laterality: N/A;  Provider requesting 3 hours /180 minutes for procedure   TONSILLECTOMY AND ADENOIDECTOMY     UTERINE FIBROID SURGERY     Patient Active Problem List   Diagnosis Date Noted   Need for influenza vaccination 09/10/2022   Primary insomnia 09/10/2022   Impaired functional mobility, balance, gait, and endurance 06/09/2022   Type 2 diabetes mellitus with diabetic neuropathy, without long-term current use of insulin ([place]) 04/24/2022   Acquired hypothyroidism 04/24/2022   Hyperlipidemia associated with type 2 diabetes mellitus ([place]) 04/24/2022   Fall 04/24/2022   Dizziness 04/24/2022   Iron deficiency anemia 04/24/2022   Colon cancer screening 01/26/2022   Encounter for screening mammogram for malignant neoplasm of breast 01/26/2022   Chronic sensorimotor polyneuropathy with axonal and demyelinating features 01/12/2022   Osteoarthritis of knees (Bilateral) 01/12/2022   Tricompartment osteoarthritis of knees (Bilateral) 01/12/2022    Grade 1 Retrolisthesis (47m) of L5 over S1 01/12/2022   History of proximal humerus fracture (Left) 01/12/2022   Vitamin D deficiency 01/12/2022   Elevated hemoglobin A1c 01/12/2022   Hypochloremia 01/12/2022   Hyperglycemia 01/12/2022   Chronic peripheral neuropathic pain 01/12/2022   Type 2 diabetes mellitus with peripheral neuropathy ([place] 01/12/2022   Glands swollen 01/09/2022  Acute cough 01/09/2022   Acute non-recurrent pansinusitis 01/09/2022   Rhinorrhea 01/09/2022   Left acute otitis media 01/09/2022   Pharmacologic therapy 11/12/2021   Disorder of skeletal system 11/12/2021   Problems influencing health status 11/12/2021   Chronic hand pain (1ry area of Pain) (Bilateral) (L>R) 11/12/2021   Chronic feet pain (3ry area of Pain) (Bilateral) (L>R) 11/12/2021   Chronic shoulder pain (4th area of Pain) (Left) 11/12/2021   Chronic knee pain (5th area of Pain) (Bilateral) (L>R) 11/12/2021   Lumbar facet syndrome  (Bilateral) 11/12/2021   Muscle spasm 10/22/2021   Type 2 diabetes mellitus with microalbuminuria, without long-term current use of insulin ([place]) 10/22/2021   Diabetic foot infection ([place]) 07/22/2021   Depression with anxiety 07/22/2021   Sepsis ([place]) 07/22/2021   COVID-19 virus infection 07/22/2021   Hypocalcemia 07/17/2021   Chronic pain syndrome 07/17/2021   S/P total thyroidectomy 03/28/2021   Thyroid nodule    Blue toe syndrome of left lower extremity ([place]) 10/01/2020   Schamberg's purpura 10/09/2019   Leg swelling 10/09/2019   Benign positional vertigo 02/03/2016   Common migraine with intractable migraine 06/18/2015   Anxiety 05/04/2015   Adaptation reaction 05/03/2015   DDD (degenerative disc disease), lumbosacral 05/03/2015   Hypercholesteremia 05/03/2015   Insomnia due to medical condition 05/03/2015   Adiposity 05/03/2015   Disorder of peripheral nervous system 05/03/2015   Snores 05/03/2015   Diabetic peripheral neuropathy ([place]) 03/06/2015   Essential hypertension 03/06/2015   Elevated liver function tests 03/06/2015   Obesity 03/06/2015   GERD (gastroesophageal reflux disease) 03/06/2015   Multinodular goiter 03/06/2015   RLS (restless legs syndrome) 03/06/2015   Chronic low back pain (2ry area of Pain) (Bilateral) (L>R) w/o sciatica 03/06/2015   Leg cramps 03/06/2015    PCP: [name], FNP  REFERRING PROVIDER: [name], FNP  REFERRING DIAG: [phone] (ICD-10-CM) - Fall, subsequent encounter  THERAPY DIAG:  Muscle weakness (generalized)  Other abnormalities of gait and mobility  Difficulty in walking, not elsewhere classified  Rationale for Evaluation and Treatment Rehabilitation  ONSET DATE: 06/18/22  SUBJECTIVE:   SUBJECTIVE STATEMENT: Pt reports able to attend a race over the weekend. No pain, just stiffness in R hip. Felt really good after last session.   PERTINENT HISTORY: Pt reports her fall initially occurred in the evening when  her husband was not home. Pt got up and had her Rolator and she walked toward her hallway. When she turned around to turn the light on she went completely down on her R leg and broke her femur distally and twisted her knee. When EMT arrived she had to straighten her LE to immobilize it. Pt went to [facility] hospital for the trauma.   Upon arrival she was evaluated and had to get MRI and imaging. Progressed to 06/19/22. Pt had surgery that night. Pt had to have plate and screws in her knee and rod in her femur. Pt was in hospital for 11 days then progressed to [facility] for 30 days. [facility].   Upon coming home with [facility] pt came home with catheter. Pt came home and was relieved. Pt husband helps with everything doing "everything" for her except she helps with cooking.   Pt considered home health physical therapy but the cost of care would be too high ( $125 per visit) and this.   Prior to injury pt was ambulating with rolator. Pt used bedside commode at that  time. Pt reports 6 falls during this year. Pt reports 2-3 falls with rolator and 2-3 falls walking between bathroom and bedroom and 1 fall in bedroom.   Pt had another fall at [facility] where she broke her left humerus about 2 years ago.   PAIN:  Are you having pain? No  PRECAUTIONS: Other: WBAT  WEIGHT BEARING RESTRICTIONS     Gardenia Phlegm, MD MPH - 09/03/2022 3:15 PM EDT   Formatting of this note might be different from the original.  At this time, you can continue weightbearing as tolerated to right lower extremity. Please continue working on range of motion and strengthening. We have given you prescription for physical therapy.   FALLS:  Has patient fallen in last 6 months? Yes. 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 Pt does not know if she will be able to sit in one position for that long. Not sure if it will be feasible to get back to work.   PLOF: Requires assistive device for independence, Needs assistance with ADLs, Needs assistance with homemaking, and Needs assistance with gait  PATIENT GOALS Pt would like to get legs moving a little bit more. Pt wants to be able to get to bathroom on her own.  Get upper body strength improved.      TODAY'S TREATMENT:  LAQ 2 x 10 2# on R and AAROM on L no resistance.  HS curls GTB 3 x 15 RLE, 3 x 12 LLE YTB .  Use of pillow case underneath each shoe to reduce friction  Glute set with manual sphygmotonometer under heel to provide feedback 2 x 10 x 3 sec   Tricep dips in w/c: x10. PT behind w/c to prevent post tilting.  Forward ball roll outs for skin integrity and anterior weight shifting for future STS efforts. 2x10x5 sec holds   Hip adductor squeeze with ball 2 x 10 ea LE   Hip abduction with band .   STS lift to standing position for total of 5 minutes, pt instructed for weight shifting and working on standing with full erect posture. Pt assisted with maintaining L LE knee alignment. Longest time standing this date.    PATIENT EDUCATION:  Education details: HEP, POC Person educated: Patient Education method: Explanation Education comprehension: verbalized understanding   HOME EXERCISE PROGRAM: Access Code: [email or web address] URL: [email or web address] Date: 09/28/2022 Prepared by: Rivka Barbara  Exercises - Seated March  - 1 x daily - 7 x weekly - 2 sets - 10 reps - Seated Long Arc Quad  - 1 x daily - 7 x weekly - 2 sets - 10 reps - Seated Heel Raise  - 1 x daily - 7 x weekly - 2 sets - 20 reps - Seated hip abduction with band  - 1 x  daily - 7 x weekly - 2 sets - 10 reps - Seated Hip Adduction Isometrics with Ball  - 1 x daily - 7 x weekly - 2 sets - 10 reps  ASSESSMENT:  CLINICAL IMPRESSION: Patient continues to progress with lower extremity strengthening this date.  Patient has improvement in left lower extremity hip muscle activation  this date as evidenced by progression of interventions.  Patient also able to tolerate prolonged standing for longer duration this session indicating improved ability to weight-bear through her lower extremities.  Patient still unable to perform standing without assist of lift machine.Pt will continue to benefit from skilled physical therapy intervention to address impairments, improve QOL, and attain therapy goals.    OBJECTIVE IMPAIRMENTS Abnormal gait, decreased activity tolerance, decreased balance, decreased endurance, decreased mobility, difficulty walking, decreased ROM, decreased strength, impaired perceived functional ability, and impaired sensation.   ACTIVITY LIMITATIONS carrying, lifting, bending, standing, squatting, stairs, transfers, bed mobility, bathing, toileting, dressing, self feeding, hygiene/grooming, and locomotion level  PARTICIPATION LIMITATIONS: meal prep, cleaning, laundry, driving, shopping, community activity, occupation, and yard work  PERSONAL FACTORS 3+ comorbidities: Polyneuropathy, hypertension, hyperlipidemia, diabetes mellitus, arthritis  are also affecting patient's functional outcome.   REHAB POTENTIAL: Fair secondary to comorbidities above particularly polyneuropathy affecting her lower extremity strength and muscle activation  CLINICAL DECISION MAKING: Evolving/moderate complexity  EVALUATION COMPLEXITY: Moderate   GOALS: Goals reviewed with patient? Yes  SHORT TERM GOALS: Target date: 10/26/2022     Patient will be independent in home exercise program to improve strength/mobility for better functional independence with ADLs. Baseline: No HEP  currently  Goal status: INITIAL   LONG TERM GOALS: Target date: 01/21/2023   Patient will be modified independent for slide board transfers in order to improve her ability to transfer to and from wheelchair as well as bedside commode for improvement in ADLs. Baseline: Patient currently requires assistance with slide board transfers is unable to utilize bedside commode and requires use of catheter and bedpan Goal status: INITIAL  2.  Patient will perform sit to and from stand with min assist from physical therapist in order to indicate improved lower extremity functional strength and improved ability to perform functional transfers and functional leg activities within her home. Baseline: Patient unable to stand independently at this time due to lower extremity weakness Goal status: INITIAL  3.  Patient will improve focus on therapeutic outcome score to 35 or greater in order to indicate improved perceived functional ability Baseline: 19 Goal status: INITIAL  4.  Patient will perform stand pivot transfer from wheelchair to mat table with moderate assistance or less in order to improve her ability to perform transfers within the home to improve her overall functional capacity. Baseline:  Goal status: INITIAL     PLAN: PT FREQUENCY: 2x/week  PT DURATION: 12 weeks  PLANNED INTERVENTIONS: Therapeutic exercises, Therapeutic activity, Neuromuscular re-education, Balance training, Gait training, Patient/Family education, Self Care, Joint mobilization, Stair training, DME instructions, and Electrical stimulation  PLAN FOR NEXT SESSION: interventions to improve slide board transfer efficacy independently as well as intervention to improve strength and efficacy for future completion of stand pivot transfers. Bed mobility and transfer training as appropriate. May need x 2 max for standing initially.    Particia Lather PT  10/29/2022, 4:26 PM

## 2022-10-31 ENCOUNTER — Other Ambulatory Visit: Payer: Self-pay | Admitting: Family Medicine

## 2022-10-31 DIAGNOSIS — E1165 Type 2 diabetes mellitus with hyperglycemia: Secondary | ICD-10-CM

## 2022-11-02 ENCOUNTER — Ambulatory Visit: Payer: BLUE CROSS/BLUE SHIELD | Admitting: Physical Therapy

## 2022-11-02 DIAGNOSIS — M6281 Muscle weakness (generalized): Secondary | ICD-10-CM | POA: Diagnosis not present

## 2022-11-02 DIAGNOSIS — R2689 Other abnormalities of gait and mobility: Secondary | ICD-10-CM

## 2022-11-02 DIAGNOSIS — R262 Difficulty in walking, not elsewhere classified: Secondary | ICD-10-CM

## 2022-11-02 NOTE — Therapy (Signed)
OUTPATIENT PHYSICAL THERAPY LOWER EXTREMITY TREATMENT   Patient Name: Gabriella Marsh MRN: [id number] DOB:Apr 15, 1966, 56 y.o., female Today's Date: 11/02/2022   PT End of Session - 11/02/22 1608     Visit Number 9    Number of Visits 24    Date for PT Re-Evaluation 12/21/22    Authorization Type BCBS Other    Authorization Time Period 09/28/22-12/21/22    Progress Note Due on Visit 10    PT Start Time 1602    PT Stop Time 1642    PT Time Calculation (min) 40 min    Activity Tolerance Patient tolerated treatment well;No increased pain    Behavior During Therapy WFL for tasks assessed/performed                 Past Medical History:  Diagnosis Date   Arthritis    knees   Blood clotting disorder ([place])    on toe    Degenerative disc disease, lumbar    Diabetes mellitus without complication (HCC)    GERD (gastroesophageal reflux disease)    Headache    daily - AM (has not been able to have SPG blocks lately)   Hyperlipidemia    Hypertension    Neuropathy    feet   Vertigo    3-4x/yr   Past Surgical History:  Procedure Laterality Date   ABDOMINAL HYSTERECTOMY     But still has cervix   AMPUTATION TOE Right 07/24/2021   Procedure: AMPUTATION TOE;  Surgeon: Sharlotte Alamo, DPM;  Location: [facility] ORS;  Service: Podiatry;  Laterality: Right;   CESAREAN SECTION     CHOLECYSTECTOMY     COLONOSCOPY WITH PROPOFOL N/A 02/20/2022   Procedure: COLONOSCOPY WITH PROPOFOL;  Surgeon: [name], MD;  Location: [facility] ENDOSCOPY;  Service: Gastroenterology;  Laterality: N/A;   left arm frature  10/12/2020   OVARIAN CYST SURGERY     PARATHYROIDECTOMY  03/28/2021   Procedure: PARATHYROIDECTOMY AUTOTRANSPLANT;  Surgeon: Fredirick Maudlin, MD;  Location: [facility] ORS;  Service: General;;   right femur surgery Right    SHOULDER SURGERY Left 12/26/014   Dr. Little Ishikawa, [place] N/A 03/28/2021   Procedure: THYROIDECTOMY, total;  Surgeon: Fredirick Maudlin, MD;  Location: [facility]  ORS;  Service: General;  Laterality: N/A;  Provider requesting 3 hours /180 minutes for procedure   TONSILLECTOMY AND ADENOIDECTOMY     UTERINE FIBROID SURGERY     Patient Active Problem List   Diagnosis Date Noted   Need for influenza vaccination 09/10/2022   Primary insomnia 09/10/2022   Impaired functional mobility, balance, gait, and endurance 06/09/2022   Type 2 diabetes mellitus with diabetic neuropathy, without long-term current use of insulin ([place]) 04/24/2022   Acquired hypothyroidism 04/24/2022   Hyperlipidemia associated with type 2 diabetes mellitus ([place]) 04/24/2022   Fall 04/24/2022   Dizziness 04/24/2022   Iron deficiency anemia 04/24/2022   Colon cancer screening 01/26/2022   Encounter for screening mammogram for malignant neoplasm of breast 01/26/2022   Chronic sensorimotor polyneuropathy with axonal and demyelinating features 01/12/2022   Osteoarthritis of knees (Bilateral) 01/12/2022   Tricompartment osteoarthritis of knees (Bilateral) 01/12/2022    Grade 1 Retrolisthesis (63m) of L5 over S1 01/12/2022   History of proximal humerus fracture (Left) 01/12/2022   Vitamin D deficiency 01/12/2022   Elevated hemoglobin A1c 01/12/2022   Hypochloremia 01/12/2022   Hyperglycemia 01/12/2022   Chronic peripheral neuropathic pain 01/12/2022   Type 2 diabetes mellitus with peripheral neuropathy ([place] 01/12/2022   Glands swollen  01/09/2022   Acute cough 01/09/2022   Acute non-recurrent pansinusitis 01/09/2022   Rhinorrhea 01/09/2022   Left acute otitis media 01/09/2022   Pharmacologic therapy 11/12/2021   Disorder of skeletal system 11/12/2021   Problems influencing health status 11/12/2021   Chronic hand pain (1ry area of Pain) (Bilateral) (L>R) 11/12/2021   Chronic feet pain (3ry area of Pain) (Bilateral) (L>R) 11/12/2021   Chronic shoulder pain (4th area of Pain) (Left) 11/12/2021   Chronic knee pain (5th area of Pain) (Bilateral) (L>R) 11/12/2021   Lumbar facet syndrome  (Bilateral) 11/12/2021   Muscle spasm 10/22/2021   Type 2 diabetes mellitus with microalbuminuria, without long-term current use of insulin ([place]) 10/22/2021   Diabetic foot infection ([place]) 07/22/2021   Depression with anxiety 07/22/2021   Sepsis ([place]) 07/22/2021   COVID-19 virus infection 07/22/2021   Hypocalcemia 07/17/2021   Chronic pain syndrome 07/17/2021   S/P total thyroidectomy 03/28/2021   Thyroid nodule    Blue toe syndrome of left lower extremity ([place]) 10/01/2020   Schamberg's purpura 10/09/2019   Leg swelling 10/09/2019   Benign positional vertigo 02/03/2016   Common migraine with intractable migraine 06/18/2015   Anxiety 05/04/2015   Adaptation reaction 05/03/2015   DDD (degenerative disc disease), lumbosacral 05/03/2015   Hypercholesteremia 05/03/2015   Insomnia due to medical condition 05/03/2015   Adiposity 05/03/2015   Disorder of peripheral nervous system 05/03/2015   Snores 05/03/2015   Diabetic peripheral neuropathy ([place]) 03/06/2015   Essential hypertension 03/06/2015   Elevated liver function tests 03/06/2015   Obesity 03/06/2015   GERD (gastroesophageal reflux disease) 03/06/2015   Multinodular goiter 03/06/2015   RLS (restless legs syndrome) 03/06/2015   Chronic low back pain (2ry area of Pain) (Bilateral) (L>R) w/o sciatica 03/06/2015   Leg cramps 03/06/2015    PCP: [name], FNP  REFERRING PROVIDER: [name], FNP  REFERRING DIAG: [phone] (ICD-10-CM) - Fall, subsequent encounter  THERAPY DIAG:  Muscle weakness (generalized)  Other abnormalities of gait and mobility  Difficulty in walking, not elsewhere classified  Rationale for Evaluation and Treatment Rehabilitation  ONSET DATE: 06/18/22  SUBJECTIVE:   SUBJECTIVE STATEMENT: Pt reports continued pain in her feet and she has also been wearing her drop foot boots. Pt was updated regarding state of pending signed order from MD for AFOs.   PERTINENT HISTORY: Pt reports her fall  initially occurred in the evening when her husband was not home. Pt got up and had her Rolator and she walked toward her hallway. When she turned around to turn the light on she went completely down on her R leg and broke her femur distally and twisted her knee. When EMT arrived she had to straighten her LE to immobilize it. Pt went to [facility] hospital for the trauma.   Upon arrival she was evaluated and had to get MRI and imaging. Progressed to 06/19/22. Pt had surgery that night. Pt had to have plate and screws in her knee and rod in her femur. Pt was in hospital for 11 days then progressed to [facility]. Pt had [facility] at [facility].   Upon coming home with [facility] pt came home with catheter. Pt came home and was relieved. Pt husband helps with everything doing "everything" for her except she helps with cooking.   Pt considered home health physical therapy but the cost of care would be too high ( $125 per visit) and this.   Prior to injury pt  was ambulating with rolator. Pt used bedside commode at that time. Pt reports 6 falls during this year. Pt reports 2-3 falls with rolator and 2-3 falls walking between bathroom and bedroom and 1 fall in bedroom.   Pt had another fall at [place] where she broke her left humerus about 2 years ago.   PAIN:  Are you having pain? No  PRECAUTIONS: Other: WBAT  WEIGHT BEARING RESTRICTIONS     Gardenia Phlegm, MD MPH - 09/03/2022 3:15 PM EDT   Formatting of this note might be different from the original.  At this time, you can continue weightbearing as tolerated to right lower extremity. Please continue working on range of motion and strengthening. We have given you prescription for physical therapy.   FALLS:  Has patient fallen in last 6 months? Yes. 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 Pt does not know if she will be able to sit in one position for that long. Not sure if it will be feasible to get back to work.   PLOF: Requires assistive device for independence, Needs assistance with ADLs, Needs assistance with homemaking, and Needs assistance with gait  PATIENT GOALS Pt would like to get legs moving a little bit more. Pt wants to be able to get to bathroom on her own.  Get upper body strength improved.      TODAY'S TREATMENT:  Slide board transfer, set up assistance     LAQ 3 x 10 2# on R and AAROM on L no resistance.  Seated march 3 x 10 with 3# AW   HS curls GTB 3 x 15 RLE, 3 x 12 LLE YTB .    Glute set with manual sphygmotonometer under heel to provide feedback 2 x 10 x 5 sec   Forward ball roll outs for skin integrity and anterior weight shifting for future STS efforts. 2x10x5 sec holds with glute set at end range     Hip abduction with band RTB 2 x 10 seated.   Slide board transfer from [facility] to mat table. Assistance only with set up of WC and with slide board placement. Good strategy and safety.     PATIENT EDUCATION:  Education details: HEP, POC Person educated: Patient Education method: Explanation Education comprehension: verbalized understanding   HOME EXERCISE PROGRAM: Access Code: [email or web address] URL: [email or web address] Date: 09/28/2022 Prepared by: Rivka Barbara  Exercises - Seated March  - 1 x daily - 7 x weekly - 2 sets - 10 reps - Seated Long Arc Quad  - 1 x daily - 7 x weekly - 2 sets - 10 reps - Seated Heel Raise  - 1 x daily - 7 x weekly - 2 sets - 20 reps - Seated hip abduction with band  - 1 x daily - 7 x weekly - 2 sets - 10 reps - Seated Hip Adduction Isometrics with Ball  - 1 x  daily - 7 x weekly - 2 sets - 10 reps  ASSESSMENT:  CLINICAL IMPRESSION: Patient continues to progress with lower extremity strengthening this date.  Patient has improvement in left lower extremity hip muscle activation this date as  evidenced by progression of interventions. Pt showing improved independence with slide board transfers but still requires some assistance for set up.  Patient still unable to perform standing without assist of lift machine.Pt will continue to benefit from skilled physical therapy intervention to address impairments, improve QOL, and attain therapy goals.    OBJECTIVE IMPAIRMENTS Abnormal gait, decreased activity tolerance, decreased balance, decreased endurance, decreased mobility, difficulty walking, decreased ROM, decreased strength, impaired perceived functional ability, and impaired sensation.   ACTIVITY LIMITATIONS carrying, lifting, bending, standing, squatting, stairs, transfers, bed mobility, bathing, toileting, dressing, self feeding, hygiene/grooming, and locomotion level  PARTICIPATION LIMITATIONS: meal prep, cleaning, laundry, driving, shopping, community activity, occupation, and yard work  PERSONAL FACTORS 3+ comorbidities: Polyneuropathy, hypertension, hyperlipidemia, diabetes mellitus, arthritis  are also affecting patient's functional outcome.   REHAB POTENTIAL: Fair secondary to comorbidities above particularly polyneuropathy affecting her lower extremity strength and muscle activation  CLINICAL DECISION MAKING: Evolving/moderate complexity  EVALUATION COMPLEXITY: Moderate   GOALS: Goals reviewed with patient? Yes  SHORT TERM GOALS: Target date: 10/26/2022     Patient will be independent in home exercise program to improve strength/mobility for better functional independence with ADLs. Baseline: No HEP currently  Goal status: INITIAL   LONG TERM GOALS: Target date: 01/25/2023   Patient will be modified independent for slide board  transfers in order to improve her ability to transfer to and from wheelchair as well as bedside commode for improvement in ADLs. Baseline: Patient currently requires assistance with slide board transfers is unable to utilize bedside commode and requires use of catheter and bedpan Goal status: INITIAL  2.  Patient will perform sit to and from stand with min assist from physical therapist in order to indicate improved lower extremity functional strength and improved ability to perform functional transfers and functional leg activities within her home. Baseline: Patient unable to stand independently at this time due to lower extremity weakness Goal status: INITIAL  3.  Patient will improve focus on therapeutic outcome score to 35 or greater in order to indicate improved perceived functional ability Baseline: 19 Goal status: INITIAL  4.  Patient will perform stand pivot transfer from wheelchair to mat table with moderate assistance or less in order to improve her ability to perform transfers within the home to improve her overall functional capacity. Baseline:  Goal status: INITIAL     PLAN: PT FREQUENCY: 2x/week  PT DURATION: 12 weeks  PLANNED INTERVENTIONS: Therapeutic exercises, Therapeutic activity, Neuromuscular re-education, Balance training, Gait training, Patient/Family education, Self Care, Joint mobilization, Stair training, DME instructions, and Electrical stimulation  PLAN FOR NEXT SESSION: interventions to improve slide board transfer efficacy independently as well as intervention to improve strength and efficacy for future completion of stand pivot transfers. Bed mobility and transfer training as appropriate. May need x 2 max for standing initially.    Particia Lather PT  11/02/2022, 4:09 PM

## 2022-11-02 NOTE — Telephone Encounter (Signed)
Requested Prescriptions  Pending Prescriptions Disp Refills   LANTUS SOLOSTAR 100 UNIT/ML Solostar Pen [Pharmacy Med Name: LANTUS SOLOSTAR PEN INJ 3ML] 15 mL 1    Sig: INJECT 20 UNITS UNDER THE SKIN AT BEDTIME, TITRATE TO FASTING SUGAR OF 130-180 AS DIRECTED     Endocrinology:  Diabetes - Insulins Failed - 10/31/2022  3:14 AM      Failed - HBA1C is between 0 and 7.9 and within 180 days    Hemoglobin A1C  Date Value Ref Range Status  09/10/2022 8.6 (A) 4.0 - 5.6 % Final   Hgb A1c MFr Bld  Date Value Ref Range Status  04/24/2022 13.1 (H) 4.8 - 5.6 % Final    Comment:             Prediabetes: 5.7 - 6.4          Diabetes: >6.4          Glycemic control for adults with diabetes: <7.0          Passed - Valid encounter within last 6 months    Recent Outpatient Visits           1 month ago Type 2 diabetes mellitus with diabetic neuropathy, without long-term current use of insulin [facility])   [facility] [name], FNP   4 months ago Fall, subsequent encounter   [facility] Tally Joe T, FNP   6 months ago Type 2 diabetes mellitus with diabetic neuropathy, without long-term current use of insulin [facility])   [facility] Tally Joe T, FNP   6 months ago Acute non-recurrent frontal sinusitis   [facility] Thedore Mins, [place], PA-C   9 months ago Type 2 diabetes mellitus with microalbuminuria, without long-term current use of insulin [facility])   [facility] [name], FNP       Future Appointments             In 2 weeks Debroah Loop, PA-C [place]   In 2 weeks Debroah Loop, [facility]   In 1 month Rollene Rotunda, Jaci Standard, [place], [place]

## 2022-11-04 ENCOUNTER — Ambulatory Visit: Payer: BLUE CROSS/BLUE SHIELD | Admitting: Physical Therapy

## 2022-11-04 ENCOUNTER — Encounter: Payer: Self-pay | Admitting: Physical Therapy

## 2022-11-04 DIAGNOSIS — R2689 Other abnormalities of gait and mobility: Secondary | ICD-10-CM

## 2022-11-04 DIAGNOSIS — M6281 Muscle weakness (generalized): Secondary | ICD-10-CM

## 2022-11-04 DIAGNOSIS — R262 Difficulty in walking, not elsewhere classified: Secondary | ICD-10-CM

## 2022-11-04 NOTE — Therapy (Signed)
OUTPATIENT PHYSICAL THERAPY LOWER EXTREMITY TREATMENT/ Physical Therapy Progress Note   Dates of reporting period  09/28/22   to   11/04/22    Patient Name: Gabriella Marsh MRN: [id number] DOB:[birth year], 56 y.o., female Today's Date: 11/04/2022   PT End of Session - 11/04/22 1513     Visit Number 10    Number of Visits 24    Date for PT Re-Evaluation 12/21/22    Authorization Type [employer] Other    Authorization Time Period 09/28/22-12/21/22    Progress Note Due on Visit 10    PT Start Time 6967    PT Stop Time 1558    PT Time Calculation (min) 43 min    Activity Tolerance Patient tolerated treatment well;No increased pain    Behavior During Therapy WFL for tasks assessed/performed                 Past Medical History:  Diagnosis Date   Arthritis    knees   Blood clotting disorder ([place])    on toe    Degenerative disc disease, lumbar    Diabetes mellitus without complication (HCC)    GERD (gastroesophageal reflux disease)    Headache    daily - AM (has not been able to have SPG blocks lately)   Hyperlipidemia    Hypertension    Neuropathy    feet   Vertigo    3-4x/yr   Past Surgical History:  Procedure Laterality Date   ABDOMINAL HYSTERECTOMY     But still has cervix   AMPUTATION TOE Right 07/24/2021   Procedure: AMPUTATION TOE;  Surgeon: Sharlotte Alamo, DPM;  Location: [facility] ORS;  Service: Podiatry;  Laterality: Right;   CESAREAN SECTION     CHOLECYSTECTOMY     COLONOSCOPY WITH PROPOFOL N/A 02/20/2022   Procedure: COLONOSCOPY WITH PROPOFOL;  Surgeon: [name], MD;  Location: [facility] ENDOSCOPY;  Service: Gastroenterology;  Laterality: N/A;   left arm frature  10/12/2020   OVARIAN CYST SURGERY     PARATHYROIDECTOMY  03/28/2021   Procedure: PARATHYROIDECTOMY AUTOTRANSPLANT;  Surgeon: Fredirick Maudlin, MD;  Location: [facility] ORS;  Service: General;;   right femur surgery Right    SHOULDER SURGERY Left 12/26/014   Dr. Little Ishikawa, [place] N/A  03/28/2021   Procedure: THYROIDECTOMY, total;  Surgeon: Fredirick Maudlin, MD;  Location: [facility] ORS;  Service: General;  Laterality: N/A;  Provider requesting 3 hours /180 minutes for procedure   TONSILLECTOMY AND ADENOIDECTOMY     UTERINE FIBROID SURGERY     Patient Active Problem List   Diagnosis Date Noted   Need for influenza vaccination 09/10/2022   Primary insomnia 09/10/2022   Impaired functional mobility, balance, gait, and endurance 06/09/2022   Type 2 diabetes mellitus with diabetic neuropathy, without long-term current use of insulin ([place]) 04/24/2022   Acquired hypothyroidism 04/24/2022   Hyperlipidemia associated with type 2 diabetes mellitus ([place]) 04/24/2022   Fall 04/24/2022   Dizziness 04/24/2022   Iron deficiency anemia 04/24/2022   Colon cancer screening 01/26/2022   Encounter for screening mammogram for malignant neoplasm of breast 01/26/2022   Chronic sensorimotor polyneuropathy with axonal and demyelinating features 01/12/2022   Osteoarthritis of knees (Bilateral) 01/12/2022   Tricompartment osteoarthritis of knees (Bilateral) 01/12/2022    Grade 1 Retrolisthesis (43m) of L5 over S1 01/12/2022   History of proximal humerus fracture (Left) 01/12/2022   Vitamin D deficiency 01/12/2022   Elevated hemoglobin A1c 01/12/2022   Hypochloremia 01/12/2022   Hyperglycemia 01/12/2022   Chronic  peripheral neuropathic pain 01/12/2022   Type 2 diabetes mellitus with peripheral neuropathy ([place]) 01/12/2022   Glands swollen 01/09/2022   Acute cough 01/09/2022   Acute non-recurrent pansinusitis 01/09/2022   Rhinorrhea 01/09/2022   Left acute otitis media 01/09/2022   Pharmacologic therapy 11/12/2021   Disorder of skeletal system 11/12/2021   Problems influencing health status 11/12/2021   Chronic hand pain (1ry area of Pain) (Bilateral) (L>R) 11/12/2021   Chronic feet pain (3ry area of Pain) (Bilateral) (L>R) 11/12/2021   Chronic shoulder pain (4th area of Pain) (Left)  11/12/2021   Chronic knee pain (5th area of Pain) (Bilateral) (L>R) 11/12/2021   Lumbar facet syndrome (Bilateral) 11/12/2021   Muscle spasm 10/22/2021   Type 2 diabetes mellitus with microalbuminuria, without long-term current use of insulin ([place]) 10/22/2021   Diabetic foot infection ([place]) 07/22/2021   Depression with anxiety 07/22/2021   Sepsis ([place]) 07/22/2021   COVID-19 virus infection 07/22/2021   Hypocalcemia 07/17/2021   Chronic pain syndrome 07/17/2021   S/P total thyroidectomy 03/28/2021   Thyroid nodule    Blue toe syndrome of left lower extremity ([place]) 10/01/2020   Schamberg's purpura 10/09/2019   Leg swelling 10/09/2019   Benign positional vertigo 02/03/2016   Common migraine with intractable migraine 06/18/2015   Anxiety 05/04/2015   Adaptation reaction 05/03/2015   DDD (degenerative disc disease), lumbosacral 05/03/2015   Hypercholesteremia 05/03/2015   Insomnia due to medical condition 05/03/2015   Adiposity 05/03/2015   Disorder of peripheral nervous system 05/03/2015   Snores 05/03/2015   Diabetic peripheral neuropathy ([place]) 03/06/2015   Essential hypertension 03/06/2015   Elevated liver function tests 03/06/2015   Obesity 03/06/2015   GERD (gastroesophageal reflux disease) 03/06/2015   Multinodular goiter 03/06/2015   RLS (restless legs syndrome) 03/06/2015   Chronic low back pain (2ry area of Pain) (Bilateral) (L>R) w/o sciatica 03/06/2015   Leg cramps 03/06/2015    PCP: [name], FNP  REFERRING PROVIDER: [name], FNP  REFERRING DIAG: [phone].[phone] (ICD-10-CM) - Fall, subsequent encounter  THERAPY DIAG:  Muscle weakness (generalized)  Other abnormalities of gait and mobility  Difficulty in walking, not elsewhere classified  Rationale for Evaluation and Treatment Rehabilitation  ONSET DATE: 06/18/22  SUBJECTIVE:   SUBJECTIVE STATEMENT: Pt reports she has been progressing with bed mobility tasks as well as furthering her independence  with slide board transfers.   PERTINENT HISTORY: Pt reports her fall initially occurred in the evening when her husband was not home. Pt got up and had her Rolator and she walked toward her hallway. When she turned around to turn the light on she went completely down on her R leg and broke her femur distally and twisted her knee. When EMT arrived she had to straighten her LE to immobilize it. Pt went to [facility] hospital for the trauma.   Upon arrival she was evaluated and had to get MRI and imaging. Progressed to 06/19/22. Pt had surgery that night. Pt had to have plate and screws in her knee and rod in her femur. Pt was in hospital for 11 days then progressed to [facility] for [facility] days. Pt [facility].   Upon coming home with [facility] pt came home with catheter. Pt came home and was relieved. Pt husband helps with everything doing "everything" for her except she helps with cooking.   Pt considered home health physical therapy but the cost of care would be too high ( $125 per  visit) and this.   Prior to injury pt was ambulating with rolator. Pt used bedside commode at that time. Pt reports 6 falls during this year. Pt reports 2-3 falls with rolator and 2-3 falls walking between bathroom and bedroom and 1 fall in bedroom.   Pt had another fall at [place] where she broke her left humerus about 2 years ago.   PAIN:  Are you having pain? No  PRECAUTIONS: Other: WBAT  WEIGHT BEARING RESTRICTIONS     Gardenia Phlegm, MD MPH - 09/03/2022 3:15 PM EDT   Formatting of this note might be different from the original.  At this time, you can continue weightbearing as tolerated to right lower extremity. Please continue working on range of motion and strengthening. We have given you prescription for physical therapy.   FALLS:  Has patient fallen in last 6 months? Yes. 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 Pt does not know if she will be able to sit in one position for that long. Not sure if it will be feasible to get back to work.   PLOF: Requires assistive device for independence, Needs assistance with ADLs, Needs assistance with homemaking, and Needs assistance with gait  PATIENT GOALS Pt would like to get legs moving a little bit more. Pt wants to be able to get to bathroom on her own.  Get upper body strength improved.      TODAY'S TREATMENT:  Physical therapy treatment session today consisted of completing assessment of goals and administration of testing as demonstrated and documented in flow sheet, treatment, and goals section of this note. Addition treatments may be found below.   LEFS completed: does not include running/hopping questions due to irrelevance to pt goals and predicted function 9/64   LAQ 1 x 10 2# on R and AAROM on L no resistance.  Seated march 1 x 10 with 3# AW   HS curls to propel WC to // bars. Difficulty with L LE movement.   Forward lean and pull with UE and cues for gluteal activation  2 x 10 reps   Slide board transfer, set up assistance for slide board transfers to Nustep.   Level 1 on Nustep x 4 minutes with L hip abduction stabilizer donned  Slide board transfer from nustep to [facility] with set up assistance from PT     PATIENT EDUCATION:  Education details: HEP, POC Person educated: Patient Education method: Explanation Education comprehension: verbalized understanding   HOME EXERCISE PROGRAM: Access Code: [email or web address] URL: [email or web address] Date: 09/28/2022 Prepared by: Rivka Barbara  Exercises - Seated March  - 1 x  daily - 7 x weekly - 2 sets - 10 reps - Seated Long Arc Quad  - 1 x daily - 7 x weekly - 2 sets - 10 reps - Seated Heel Raise  - 1 x daily - 7 x weekly - 2 sets - 20 reps - Seated hip abduction with band  - 1 x daily - 7 x weekly - 2 sets - 10 reps - Seated Hip Adduction Isometrics with Ball  - 1 x daily - 7 x weekly - 2 sets -  10 reps  ASSESSMENT:  CLINICAL IMPRESSION: Patient presents to physical therapy for progress note this date.  Patient has made progress with her home exercise program is made completing regularly.  Patient's lower extremity strength continues to improve as evidenced by improved resistance with activities.  Patient has progressed ability to perform slide board transfers and can now complete with modified independence as long as she has assistance with set up and removal of slide board and positioning of her wheelchair.  Patient is still unable to stand successfully but has been provided with referral to orthotist and is in process of receiving lower extremity orthotics and will assist her with standing but she will still continue to require significant therapeutic interventions to make standing and taking steps possible.  Patient's focus on therapeutic outcome score has improved and patient also completes lower extremity functional scale to further quantify her restrictions.  Based on physical therapy progress note patient will continue to benefit from skilled physical therapy interventions to improve her lower extremity strength, mobility, function, quality of life. Patient's condition has the potential to improve in response to therapy. Maximum improvement is yet to be obtained. The anticipated improvement is attainable and reasonable in a generally predictable time.      OBJECTIVE IMPAIRMENTS Abnormal gait, decreased activity tolerance, decreased balance, decreased endurance, decreased mobility, difficulty walking, decreased ROM, decreased strength, impaired perceived  functional ability, and impaired sensation.   ACTIVITY LIMITATIONS carrying, lifting, bending, standing, squatting, stairs, transfers, bed mobility, bathing, toileting, dressing, self feeding, hygiene/grooming, and locomotion level  PARTICIPATION LIMITATIONS: meal prep, cleaning, laundry, driving, shopping, community activity, occupation, and yard work  PERSONAL FACTORS 3+ comorbidities: Polyneuropathy, hypertension, hyperlipidemia, diabetes mellitus, arthritis  are also affecting patient's functional outcome.   REHAB POTENTIAL: Fair secondary to comorbidities above particularly polyneuropathy affecting her lower extremity strength and muscle activation  CLINICAL DECISION MAKING: Evolving/moderate complexity  EVALUATION COMPLEXITY: Moderate   GOALS: Goals reviewed with patient? Yes  SHORT TERM GOALS: Target date: 10/26/2022     Patient will be independent in home exercise program to improve strength/mobility for better functional independence with ADLs. Baseline: No HEP currently 11/15: Completing Hep regularly.  Goal status: Met    LONG TERM GOALS: Target date: 01/27/2023   Patient will be modified independent for slide board transfers in order to improve her ability to transfer to and from wheelchair as well as bedside commode for improvement in ADLs. Baseline: Patient currently requires assistance with slide board transfers is unable to utilize bedside commode and requires use of catheter and bedpan 11/15: Pt completing slide board transfers and only requires assistance with placing slide board and maneuvering WC Goal status: IN PROGRESS  2.  Patient will perform sit to and from stand with min assist from physical therapist in order to indicate improved lower extremity functional strength and improved ability to perform functional transfers and functional leg activities within her home. Baseline: Patient unable to stand independently at this time due to lower extremity weakness  11/04/2022: Patient is able to perform anterior lean and pull on parallel bars and activate gluteal muscles was unable to lift weight from wheelchair Goal status: INITIAL  3.  Patient will improve focus on therapeutic outcome score to 35 or greater in order to indicate improved perceived functional ability Baseline: 19 11/15: 40 Goal status: MET  4.  Patient will perform stand pivot transfer from wheelchair to mat table with moderate assistance or less in order to improve her ability to perform transfers within the  home to improve her overall functional capacity. Baseline: Unable to complete 11/04/2022: Patient is able to perform anterior lean and pull on parallel bars and activate gluteal muscles was unable to lift weight from wheelchair Goal status: INITIAL  4.  Pt will improve LEFS by 10 points or more in order to indicate improved LE function and mobility.  Baseline: 9 Goal status: INITIAL    PLAN: PT FREQUENCY: 2x/week  PT DURATION: 12 weeks  PLANNED INTERVENTIONS: Therapeutic exercises, Therapeutic activity, Neuromuscular re-education, Balance training, Gait training, Patient/Family education, Self Care, Joint mobilization, Stair training, DME instructions, and Electrical stimulation  PLAN FOR NEXT SESSION: interventions to improve slide board transfer efficacy independently as well as intervention to improve strength and efficacy for future completion of stand pivot transfers. Bed mobility and transfer training as appropriate. May need x 2 max for standing initially.    Particia Lather PT  11/04/2022, 5:20 PM

## 2022-11-09 ENCOUNTER — Encounter: Payer: Self-pay | Admitting: Physical Therapy

## 2022-11-09 ENCOUNTER — Ambulatory Visit: Payer: BLUE CROSS/BLUE SHIELD | Admitting: Physical Therapy

## 2022-11-09 DIAGNOSIS — R2689 Other abnormalities of gait and mobility: Secondary | ICD-10-CM

## 2022-11-09 DIAGNOSIS — M6281 Muscle weakness (generalized): Secondary | ICD-10-CM | POA: Diagnosis not present

## 2022-11-09 DIAGNOSIS — R262 Difficulty in walking, not elsewhere classified: Secondary | ICD-10-CM

## 2022-11-09 NOTE — Therapy (Signed)
OUTPATIENT PHYSICAL THERAPY LOWER EXTREMITY TREATMENT    Patient Name: Gabriella Marsh MRN: [id number] DOB:Dec 17, 1966, 56 y.o., female Today's Date: 11/09/2022   PT End of Session - 11/09/22 1617     Visit Number 11    Number of Visits 24    Date for PT Re-Evaluation 12/21/22    Authorization Type [employer] Other    Authorization Time Period 09/28/22-12/21/22    Progress Note Due on Visit 10    PT Start Time 1603    PT Stop Time 6979    PT Time Calculation (min) 41 min    Activity Tolerance Patient tolerated treatment well;No increased pain    Behavior During Therapy WFL for tasks assessed/performed                  Past Medical History:  Diagnosis Date   Arthritis    knees   Blood clotting disorder ([place])    on toe    Degenerative disc disease, lumbar    Diabetes mellitus without complication (HCC)    GERD (gastroesophageal reflux disease)    Headache    daily - AM (has not been able to have SPG blocks lately)   Hyperlipidemia    Hypertension    Neuropathy    feet   Vertigo    3-4x/yr   Past Surgical History:  Procedure Laterality Date   ABDOMINAL HYSTERECTOMY     But still has cervix   AMPUTATION TOE Right 07/24/2021   Procedure: AMPUTATION TOE;  Surgeon: Sharlotte Alamo, DPM;  Location: [facility] ORS;  Service: Podiatry;  Laterality: Right;   CESAREAN SECTION     CHOLECYSTECTOMY     COLONOSCOPY WITH PROPOFOL N/A 02/20/2022   Procedure: COLONOSCOPY WITH PROPOFOL;  Surgeon: [name], MD;  Location: [facility] ENDOSCOPY;  Service: Gastroenterology;  Laterality: N/A;   left arm frature  10/12/2020   OVARIAN CYST SURGERY     PARATHYROIDECTOMY  03/28/2021   Procedure: PARATHYROIDECTOMY AUTOTRANSPLANT;  Surgeon: Fredirick Maudlin, MD;  Location: [facility] ORS;  Service: General;;   right femur surgery Right    SHOULDER SURGERY Left 12/26/014   Dr. Little Ishikawa, [place] N/A 03/28/2021   Procedure: THYROIDECTOMY, total;  Surgeon: Fredirick Maudlin, MD;  Location:  [facility] ORS;  Service: General;  Laterality: N/A;  Provider requesting 3 hours /180 minutes for procedure   TONSILLECTOMY AND ADENOIDECTOMY     UTERINE FIBROID SURGERY     Patient Active Problem List   Diagnosis Date Noted   Need for influenza vaccination 09/10/2022   Primary insomnia 09/10/2022   Impaired functional mobility, balance, gait, and endurance 06/09/2022   Type 2 diabetes mellitus with diabetic neuropathy, without long-term current use of insulin ([place]) 04/24/2022   Acquired hypothyroidism 04/24/2022   Hyperlipidemia associated with type 2 diabetes mellitus ([place]) 04/24/2022   Fall 04/24/2022   Dizziness 04/24/2022   Iron deficiency anemia 04/24/2022   Colon cancer screening 01/26/2022   Encounter for screening mammogram for malignant neoplasm of breast 01/26/2022   Chronic sensorimotor polyneuropathy with axonal and demyelinating features 01/12/2022   Osteoarthritis of knees (Bilateral) 01/12/2022   Tricompartment osteoarthritis of knees (Bilateral) 01/12/2022    Grade 1 Retrolisthesis (63m) of L5 over S1 01/12/2022   History of proximal humerus fracture (Left) 01/12/2022   Vitamin D deficiency 01/12/2022   Elevated hemoglobin A1c 01/12/2022   Hypochloremia 01/12/2022   Hyperglycemia 01/12/2022   Chronic peripheral neuropathic pain 01/12/2022   Type 2 diabetes mellitus with peripheral neuropathy [place] 01/12/2022  Glands swollen 01/09/2022   Acute cough 01/09/2022   Acute non-recurrent pansinusitis 01/09/2022   Rhinorrhea 01/09/2022   Left acute otitis media 01/09/2022   Pharmacologic therapy 11/12/2021   Disorder of skeletal system 11/12/2021   Problems influencing health status 11/12/2021   Chronic hand pain (1ry area of Pain) (Bilateral) (L>R) 11/12/2021   Chronic feet pain (3ry area of Pain) (Bilateral) (L>R) 11/12/2021   Chronic shoulder pain (4th area of Pain) (Left) 11/12/2021   Chronic knee pain (5th area of Pain) (Bilateral) (L>R) 11/12/2021   Lumbar facet  syndrome (Bilateral) 11/12/2021   Muscle spasm 10/22/2021   Type 2 diabetes mellitus with microalbuminuria, without long-term current use of insulin ([place]) 10/22/2021   Diabetic foot infection ([place]) 07/22/2021   Depression with anxiety 07/22/2021   Sepsis ([place]) 07/22/2021   COVID-19 virus infection 07/22/2021   Hypocalcemia 07/17/2021   Chronic pain syndrome 07/17/2021   S/P total thyroidectomy 03/28/2021   Thyroid nodule    Blue toe syndrome of left lower extremity ([place]) 10/01/2020   Schamberg's purpura 10/09/2019   Leg swelling 10/09/2019   Benign positional vertigo 02/03/2016   Common migraine with intractable migraine 06/18/2015   Anxiety 05/04/2015   Adaptation reaction 05/03/2015   DDD (degenerative disc disease), lumbosacral 05/03/2015   Hypercholesteremia 05/03/2015   Insomnia due to medical condition 05/03/2015   Adiposity 05/03/2015   Disorder of peripheral nervous system 05/03/2015   Snores 05/03/2015   Diabetic peripheral neuropathy ([place]) 03/06/2015   Essential hypertension 03/06/2015   Elevated liver function tests 03/06/2015   Obesity 03/06/2015   GERD (gastroesophageal reflux disease) 03/06/2015   Multinodular goiter 03/06/2015   RLS (restless legs syndrome) 03/06/2015   Chronic low back pain (2ry area of Pain) (Bilateral) (L>R) w/o sciatica 03/06/2015   Leg cramps 03/06/2015    PCP: [name], FNP  REFERRING PROVIDER: [name], FNP  REFERRING DIAG: [phone] (ICD-10-CM) - Fall, subsequent encounter  THERAPY DIAG:  Other abnormalities of gait and mobility  Muscle weakness (generalized)  Difficulty in walking, not elsewhere classified  Rationale for Evaluation and Treatment Rehabilitation  ONSET DATE: 06/18/22  SUBJECTIVE:   SUBJECTIVE STATEMENT: Pt reports she has had continued discomfort in her LEs in area of her quad musculature at night particularly. Not sure if it is related to PT or not.   PERTINENT HISTORY: Pt reports her fall  initially occurred in the evening when her husband was not home. Pt got up and had her Rolator and she walked toward her hallway. When she turned around to turn the light on she went completely down on her R leg and broke her femur distally and twisted her knee. When EMT arrived she had to straighten her LE to immobilize it. Pt went to [facility] hospital for the trauma.   Upon arrival she was evaluated and had to get MRI and imaging. Progressed to 06/19/22. Pt had surgery that night. Pt had to have plate and screws in her knee and rod in her femur. Pt was in hospital for 11 days then progressed to [facility] for 30 days. Pt [facility] rough time at [facility].   Upon coming home with [facility] pt came home with catheter. Pt came home and was relieved. Pt husband helps with everything doing "everything" for her except she helps with cooking.   Pt considered home health physical therapy but the cost of care would be too high ( $125 per visit) and this.   Prior to injury  pt was ambulating with rolator. Pt used bedside commode at that time. Pt reports 6 falls during this year. Pt reports 2-3 falls with rolator and 2-3 falls walking between bathroom and bedroom and 1 fall in bedroom.   Pt had another fall at [place] where she broke her left humerus about 2 years ago.   PAIN:  Are you having pain? No  PRECAUTIONS: Other: WBAT  WEIGHT BEARING RESTRICTIONS     Gardenia Phlegm, MD MPH - 09/03/2022 3:15 PM EDT   Formatting of this note might be different from the original.  At this time, you can continue weightbearing as tolerated to right lower extremity. Please continue working on range of motion and strengthening. We have given you prescription for physical therapy.   FALLS:  Has patient fallen in last 6 months? Yes. 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 Pt does not know if she will be able to sit in one position for that long. Not sure if it will be feasible to get back to work.   PLOF: Requires assistive device for independence, Needs assistance with ADLs, Needs assistance with homemaking, and Needs assistance with gait  PATIENT GOALS Pt would like to get legs moving a little bit more. Pt wants to be able to get to bathroom on her own.  Get upper body strength improved.      TODAY'S TREATMENT: TE  STS sabina transfers to/from [employer].   Level 1 on Nustep x 6 minutes with L hip abduction stabilizer donned and R LE braced with theraband to keep foot in place.   -with transfer from nustep to [facility] placed airex pad in [place] seat to ease difficulty of STS transfer   Forward leans x 10 to work on ant weight shift for STS  Maneuvered WC with foot rests removed to // bars.  TA STS transfer with max A x 2 for initial phase of standing and with min/mod A for maintenance of standing. Pt able to stand for 30 seconds on first round and 60 seconds on second round. Pt required assist at glutes to ensure forward weight shift in standing position. Pt also has assistance of B UE on // bars throughout standing duration.       PATIENT EDUCATION:  Education details: HEP, POC Person educated: Patient Education method: Explanation Education comprehension: verbalized understanding   HOME EXERCISE PROGRAM: Access Code: [email or web address] URL: [email or web address] Date: 09/28/2022 Prepared by: Rivka Barbara  Exercises - Seated March  - 1 x daily - 7 x weekly - 2 sets - 10 reps - Seated Long Arc Quad  - 1 x daily - 7 x weekly - 2 sets - 10 reps - Seated Heel Raise  - 1 x daily - 7 x weekly - 2 sets - 20  reps - Seated hip abduction with band  - 1 x daily - 7 x weekly - 2 sets - 10 reps - Seated Hip Adduction Isometrics with Ball  - 1 x daily - 7 x weekly - 2 sets - 10 reps  ASSESSMENT:  CLINICAL IMPRESSION: Pt presents with great motivation for completion of PT activities. Pt able  to stand with max A x 2 from PT this date but was first time standing for 5 months. Pt emotional and happy with progress to this point. Pt still not able to perform standing in her home environment but the progress is encouraging. Pt will continue to benefit from skilled Pt interventions to improve LE strength, mobility and QOL.    OBJECTIVE IMPAIRMENTS Abnormal gait, decreased activity tolerance, decreased balance, decreased endurance, decreased mobility, difficulty walking, decreased ROM, decreased strength, impaired perceived functional ability, and impaired sensation.   ACTIVITY LIMITATIONS carrying, lifting, bending, standing, squatting, stairs, transfers, bed mobility, bathing, toileting, dressing, self feeding, hygiene/grooming, and locomotion level  PARTICIPATION LIMITATIONS: meal prep, cleaning, laundry, driving, shopping, community activity, occupation, and yard work  PERSONAL FACTORS 3+ comorbidities: Polyneuropathy, hypertension, hyperlipidemia, diabetes mellitus, arthritis  are also affecting patient's functional outcome.   REHAB POTENTIAL: Fair secondary to comorbidities above particularly polyneuropathy affecting her lower extremity strength and muscle activation  CLINICAL DECISION MAKING: Evolving/moderate complexity  EVALUATION COMPLEXITY: Moderate   GOALS: Goals reviewed with patient? Yes  SHORT TERM GOALS: Target date: 10/26/2022     Patient will be independent in home exercise program to improve strength/mobility for better functional independence with ADLs. Baseline: No HEP currently 11/15: Completing Hep regularly.  Goal status: Met    LONG TERM GOALS: Target date: 01/27/2023    Patient will be modified independent for slide board transfers in order to improve her ability to transfer to and from wheelchair as well as bedside commode for improvement in ADLs. Baseline: Patient currently requires assistance with slide board transfers is unable to utilize bedside commode and requires use of catheter and bedpan 11/15: Pt completing slide board transfers and only requires assistance with placing slide board and maneuvering WC Goal status: IN PROGRESS  2.  Patient will perform sit to and from stand with min assist from physical therapist in order to indicate improved lower extremity functional strength and improved ability to perform functional transfers and functional leg activities within her home. Baseline: Patient unable to stand independently at this time due to lower extremity weakness 11/04/2022: Patient is able to perform anterior lean and pull on parallel bars and activate gluteal muscles was unable to lift weight from wheelchair Goal status: INITIAL  3.  Patient will improve focus on therapeutic outcome score to 35 or greater in order to indicate improved perceived functional ability Baseline: 19 11/15: 40 Goal status: MET  4.  Patient will perform stand pivot transfer from wheelchair to mat table with moderate assistance or less in order to improve her ability to perform transfers within the home to improve her overall functional capacity. Baseline: Unable to complete 11/04/2022: Patient is able to perform anterior lean and pull on parallel bars and activate gluteal muscles was unable to lift weight from wheelchair Goal status: INITIAL  4.  Pt will improve LEFS by 10 points or more in order to indicate improved LE function and mobility.  Baseline: 9 Goal status: INITIAL    PLAN: PT FREQUENCY: 2x/week  PT DURATION: 12 weeks  PLANNED INTERVENTIONS: Therapeutic exercises, Therapeutic activity, Neuromuscular re-education, Balance training, Gait training,  Patient/Family education, Self Care, Joint mobilization, Stair training, DME instructions, and Electrical stimulation  PLAN FOR NEXT SESSION: Nustep, standing in // bars as able, LE strength    Particia Lather PT  11/09/2022, 5:08 PM

## 2022-11-10 ENCOUNTER — Ambulatory Visit: Payer: BLUE CROSS/BLUE SHIELD | Admitting: Urology

## 2022-11-16 ENCOUNTER — Ambulatory Visit: Payer: BLUE CROSS/BLUE SHIELD | Admitting: Physician Assistant

## 2022-11-16 ENCOUNTER — Encounter: Payer: BLUE CROSS/BLUE SHIELD | Admitting: Physical Therapy

## 2022-11-18 ENCOUNTER — Ambulatory Visit: Payer: BLUE CROSS/BLUE SHIELD

## 2022-11-18 NOTE — Therapy (Incomplete)
OUTPATIENT PHYSICAL THERAPY LOWER EXTREMITY TREATMENT    Patient Name: Gabriella Marsh MRN: [id number] DOB:[birth year], 56 y.o., female Today's Date: 11/18/2022          Past Medical History:  Diagnosis Date   Arthritis    knees   Blood clotting disorder ([place])    on toe    Degenerative disc disease, lumbar    Diabetes mellitus without complication (HCC)    GERD (gastroesophageal reflux disease)    Headache    daily - AM (has not been able to have SPG blocks lately)   Hyperlipidemia    Hypertension    Neuropathy    feet   Vertigo    3-4x/yr   Past Surgical History:  Procedure Laterality Date   ABDOMINAL HYSTERECTOMY     But still has cervix   AMPUTATION TOE Right 07/24/2021   Procedure: AMPUTATION TOE;  Surgeon: Sharlotte Alamo, DPM;  Location: [facility] ORS;  Service: Podiatry;  Laterality: Right;   CESAREAN SECTION     CHOLECYSTECTOMY     COLONOSCOPY WITH PROPOFOL N/A 02/20/2022   Procedure: COLONOSCOPY WITH PROPOFOL;  Surgeon: [name], MD;  Location: [facility] ENDOSCOPY;  Service: Gastroenterology;  Laterality: N/A;   left arm frature  10/12/2020   OVARIAN CYST SURGERY     PARATHYROIDECTOMY  03/28/2021   Procedure: PARATHYROIDECTOMY AUTOTRANSPLANT;  Surgeon: Fredirick Maudlin, MD;  Location: [facility] ORS;  Service: General;;   right femur surgery Right    SHOULDER SURGERY Left 12/26/014   Dr. Little Ishikawa, [place] N/A 03/28/2021   Procedure: THYROIDECTOMY, total;  Surgeon: Fredirick Maudlin, MD;  Location: [facility] ORS;  Service: General;  Laterality: N/A;  Provider requesting 3 hours /180 minutes for procedure   TONSILLECTOMY AND ADENOIDECTOMY     UTERINE FIBROID SURGERY     Patient Active Problem List   Diagnosis Date Noted   Need for influenza vaccination 09/10/2022   Primary insomnia 09/10/2022   Impaired functional mobility, balance, gait, and endurance 06/09/2022   Type 2 diabetes mellitus with diabetic neuropathy, without long-term current use of  insulin ([place]) 04/24/2022   Acquired hypothyroidism 04/24/2022   Hyperlipidemia associated with type 2 diabetes mellitus ([place]) 04/24/2022   Fall 04/24/2022   Dizziness 04/24/2022   Iron deficiency anemia 04/24/2022   Colon cancer screening 01/26/2022   Encounter for screening mammogram for malignant neoplasm of breast 01/26/2022   Chronic sensorimotor polyneuropathy with axonal and demyelinating features 01/12/2022   Osteoarthritis of knees (Bilateral) 01/12/2022   Tricompartment osteoarthritis of knees (Bilateral) 01/12/2022    Grade 1 Retrolisthesis (36m) of L5 over S1 01/12/2022   History of proximal humerus fracture (Left) 01/12/2022   Vitamin D deficiency 01/12/2022   Elevated hemoglobin A1c 01/12/2022   Hypochloremia 01/12/2022   Hyperglycemia 01/12/2022   Chronic peripheral neuropathic pain 01/12/2022   Type 2 diabetes mellitus with peripheral neuropathy ([place] 01/12/2022   Glands swollen 01/09/2022   Acute cough 01/09/2022   Acute non-recurrent pansinusitis 01/09/2022   Rhinorrhea 01/09/2022   Left acute otitis media 01/09/2022   Pharmacologic therapy 11/12/2021   Disorder of skeletal system 11/12/2021   Problems influencing health status 11/12/2021   Chronic hand pain (1ry area of Pain) (Bilateral) (L>R) 11/12/2021   Chronic feet pain (3ry area of Pain) (Bilateral) (L>R) 11/12/2021   Chronic shoulder pain (4th area of Pain) (Left) 11/12/2021   Chronic knee pain (5th area of Pain) (Bilateral) (L>R) 11/12/2021   Lumbar facet syndrome (Bilateral) 11/12/2021   Muscle spasm 10/22/2021   Type  2 diabetes mellitus with microalbuminuria, without long-term current use of insulin ([place]) 10/22/2021   Diabetic foot infection ([place]) 07/22/2021   Depression with anxiety 07/22/2021   Sepsis ([place]) 07/22/2021   COVID-19 virus infection 07/22/2021   Hypocalcemia 07/17/2021   Chronic pain syndrome 07/17/2021   S/P total thyroidectomy 03/28/2021   Thyroid nodule    Blue toe syndrome of left  lower extremity ([place]) 10/01/2020   Schamberg's purpura 10/09/2019   Leg swelling 10/09/2019   Benign positional vertigo 02/03/2016   Common migraine with intractable migraine 06/18/2015   Anxiety 05/04/2015   Adaptation reaction 05/03/2015   DDD (degenerative disc disease), lumbosacral 05/03/2015   Hypercholesteremia 05/03/2015   Insomnia due to medical condition 05/03/2015   Adiposity 05/03/2015   Disorder of peripheral nervous system 05/03/2015   Snores 05/03/2015   Diabetic peripheral neuropathy ([place]) 03/06/2015   Essential hypertension 03/06/2015   Elevated liver function tests 03/06/2015   Obesity 03/06/2015   GERD (gastroesophageal reflux disease) 03/06/2015   Multinodular goiter 03/06/2015   RLS (restless legs syndrome) 03/06/2015   Chronic low back pain (2ry area of Pain) (Bilateral) (L>R) w/o sciatica 03/06/2015   Leg cramps 03/06/2015    PCP: [name], FNP  REFERRING PROVIDER: [name], FNP  REFERRING DIAG: [phone] (ICD-10-CM) - Fall, subsequent encounter  THERAPY DIAG:  No diagnosis found.  Rationale for Evaluation and Treatment Rehabilitation  ONSET DATE: 06/18/22  SUBJECTIVE:   SUBJECTIVE STATEMENT: *** Pt reports she has had continued discomfort in her LEs in area of her quad musculature at night particularly. Not sure if it is related to PT or not.   PERTINENT HISTORY: Pt reports her fall initially occurred in the evening when her husband was not home. Pt got up and had her Rolator and she walked toward her hallway. When she turned around to turn the light on she went completely down on her R leg and broke her femur distally and twisted her knee. When EMT arrived she had to straighten her LE to immobilize it. Pt went to [facility] hospital for the trauma.   Upon arrival she was evaluated and had to get MRI and imaging. Progressed to 06/19/22. Pt had surgery that night. Pt had to have plate and screws in her knee and rod in her femur. Pt was in hospital  for 11 days then progressed to [facility] for 30 days. Pt [facility].   Upon coming home with [facility] pt came home with catheter. Pt came home and was relieved. Pt husband helps with everything doing "everything" for her except she helps with cooking.   Pt considered home health physical therapy but the cost of care would be too high ( $125 per visit) and this.   Prior to injury pt was ambulating with rolator. Pt used bedside commode at that time. Pt reports 6 falls during this year. Pt reports 2-3 falls with rolator and 2-3 falls walking between bathroom and bedroom and 1 fall in bedroom.   Pt had another fall at [place] where she broke her left humerus about 2 years ago.   PAIN:  Are you having pain? No  PRECAUTIONS: Other: WBAT  WEIGHT BEARING RESTRICTIONS     Gardenia Phlegm, MD MPH - 09/03/2022 3:15 PM EDT   Formatting of this note might be different from the original.  At this time, you can continue weightbearing as tolerated to right lower extremity. Please continue working on range  of motion and strengthening. We have given you prescription for physical therapy.   FALLS:  Has patient fallen in last 6 months? Yes. 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 Pt does not know if she will be able to sit in one position for that long. Not sure if it will be feasible to get back to work.   PLOF: Requires assistive device for independence, Needs assistance with ADLs, Needs assistance with homemaking, and Needs assistance with gait  PATIENT GOALS Pt would like to get legs moving a  little bit more. Pt wants to be able to get to bathroom on her own.  Get upper body strength improved.      TODAY'S TREATMENT:  ***  STS sabina transfers to/from [employer].   Level 1 on Nustep x 6 minutes with L hip abduction stabilizer donned and R LE braced with theraband to keep foot in place.   -with transfer from nustep to [facility] placed airex pad in [place] seat to ease difficulty of STS transfer   Forward leans x 10 to work on ant weight shift for STS  Maneuvered WC with foot rests removed to // bars.  TA STS transfer with max A x 2 for initial phase of standing and with min/mod A for maintenance of standing. Pt able to stand for 30 seconds on first round and 60 seconds on second round. Pt required assist at glutes to ensure forward weight shift in standing position. Pt also has assistance of B UE on // bars throughout standing duration.       PATIENT EDUCATION:  Education details: HEP, POC Person educated: Patient Education method: Explanation Education comprehension: verbalized understanding   HOME EXERCISE PROGRAM: Access Code: [email or web address] URL: [email or web address] Date: 09/28/2022 Prepared by: Rivka Barbara  Exercises - Seated March  - 1 x daily - 7 x weekly - 2 sets - 10 reps - Seated Long Arc Quad  - 1 x daily - 7 x weekly - 2 sets - 10 reps - Seated Heel Raise  - 1 x daily - 7 x weekly - 2 sets - 20 reps - Seated hip abduction with band  - 1 x daily - 7 x weekly - 2 sets - 10 reps - Seated Hip Adduction Isometrics with Ball  - 1 x daily - 7 x weekly - 2 sets - 10 reps    ASSESSMENT:  CLINICAL IMPRESSION: *** Pt presents with great motivation for completion of PT activities. Pt able to stand with max A x 2 from PT this date but was first time standing for 5 months. Pt emotional and happy with progress to this point. Pt still not able to perform standing in her home environment but the progress is encouraging. Pt will continue to benefit from skilled  Pt interventions to improve LE strength, mobility and QOL.    OBJECTIVE IMPAIRMENTS Abnormal gait, decreased activity tolerance, decreased balance, decreased endurance, decreased mobility, difficulty walking, decreased ROM, decreased strength, impaired perceived functional ability, and impaired sensation.   ACTIVITY LIMITATIONS carrying, lifting, bending, standing, squatting, stairs, transfers, bed mobility, bathing, toileting, dressing, self feeding, hygiene/grooming, and locomotion level  PARTICIPATION LIMITATIONS: meal  prep, cleaning, laundry, driving, shopping, community activity, occupation, and yard work  PERSONAL FACTORS 3+ comorbidities: Polyneuropathy, hypertension, hyperlipidemia, diabetes mellitus, arthritis  are also affecting patient's functional outcome.   REHAB POTENTIAL: Fair secondary to comorbidities above particularly polyneuropathy affecting her lower extremity strength and muscle activation  CLINICAL DECISION MAKING: Evolving/moderate complexity  EVALUATION COMPLEXITY: Moderate   GOALS: Goals reviewed with patient? Yes  SHORT TERM GOALS: Target date: 10/26/2022     Patient will be independent in home exercise program to improve strength/mobility for better functional independence with ADLs. Baseline: No HEP currently 11/15: Completing Hep regularly.  Goal status: Met    LONG TERM GOALS: Target date: 01/27/2023   Patient will be modified independent for slide board transfers in order to improve her ability to transfer to and from wheelchair as well as bedside commode for improvement in ADLs. Baseline: Patient currently requires assistance with slide board transfers is unable to utilize bedside commode and requires use of catheter and bedpan 11/15: Pt completing slide board transfers and only requires assistance with placing slide board and maneuvering WC Goal status: IN PROGRESS  2.  Patient will perform sit to and from stand with min assist from physical therapist  in order to indicate improved lower extremity functional strength and improved ability to perform functional transfers and functional leg activities within her home. Baseline: Patient unable to stand independently at this time due to lower extremity weakness 11/04/2022: Patient is able to perform anterior lean and pull on parallel bars and activate gluteal muscles was unable to lift weight from wheelchair Goal status: INITIAL  3.  Patient will improve focus on therapeutic outcome score to 35 or greater in order to indicate improved perceived functional ability Baseline: 19 11/15: 40 Goal status: MET  4.  Patient will perform stand pivot transfer from wheelchair to mat table with moderate assistance or less in order to improve her ability to perform transfers within the home to improve her overall functional capacity. Baseline: Unable to complete 11/04/2022: Patient is able to perform anterior lean and pull on parallel bars and activate gluteal muscles was unable to lift weight from wheelchair Goal status: INITIAL  4.  Pt will improve LEFS by 10 points or more in order to indicate improved LE function and mobility.  Baseline: 9 Goal status: INITIAL    PLAN: PT FREQUENCY: 2x/week  PT DURATION: 12 weeks  PLANNED INTERVENTIONS: Therapeutic exercises, Therapeutic activity, Neuromuscular re-education, Balance training, Gait training, Patient/Family education, Self Care, Joint mobilization, Stair training, DME instructions, and Electrical stimulation  PLAN FOR NEXT SESSION:  *** Nustep, standing in // bars as able, LE strength    Gwenlyn Saran, PT, DPT Physical Therapist- [facility]  11/18/22, 9:38 AM

## 2022-11-20 ENCOUNTER — Telehealth: Payer: BLUE CROSS/BLUE SHIELD | Admitting: Family Medicine

## 2022-11-20 ENCOUNTER — Ambulatory Visit: Payer: BLUE CROSS/BLUE SHIELD | Admitting: Physical Therapy

## 2022-11-20 ENCOUNTER — Ambulatory Visit: Payer: Self-pay | Admitting: *Deleted

## 2022-11-20 DIAGNOSIS — R6889 Other general symptoms and signs: Secondary | ICD-10-CM

## 2022-11-20 MED ORDER — OSELTAMIVIR PHOSPHATE 75 MG PO CAPS: 75.0000 mg | ORAL_CAPSULE | Freq: Two times a day (BID) | ORAL | 0 refills | Status: DC

## 2022-11-20 NOTE — Therapy (Deleted)
OUTPATIENT PHYSICAL THERAPY LOWER EXTREMITY TREATMENT    Patient Name: Gabriella Marsh MRN: [id number] DOB:Nov 30, 1966, 56 y.o., female Today's Date: 11/20/2022          Past Medical History:  Diagnosis Date   Arthritis    knees   Blood clotting disorder ([place])    on toe    Degenerative disc disease, lumbar    Diabetes mellitus without complication (HCC)    GERD (gastroesophageal reflux disease)    Headache    daily - AM (has not been able to have SPG blocks lately)   Hyperlipidemia    Hypertension    Neuropathy    feet   Vertigo    3-4x/yr   Past Surgical History:  Procedure Laterality Date   ABDOMINAL HYSTERECTOMY     But still has cervix   AMPUTATION TOE Right 07/24/2021   Procedure: AMPUTATION TOE;  Surgeon: Sharlotte Alamo, DPM;  Location: [facility] ORS;  Service: Podiatry;  Laterality: Right;   CESAREAN SECTION     CHOLECYSTECTOMY     COLONOSCOPY WITH PROPOFOL N/A 02/20/2022   Procedure: COLONOSCOPY WITH PROPOFOL;  Surgeon: [name], MD;  Location: [facility] ENDOSCOPY;  Service: Gastroenterology;  Laterality: N/A;   left arm frature  10/12/2020   OVARIAN CYST SURGERY     PARATHYROIDECTOMY  03/28/2021   Procedure: PARATHYROIDECTOMY AUTOTRANSPLANT;  Surgeon: Fredirick Maudlin, MD;  Location: [facility] ORS;  Service: General;;   right femur surgery Right    SHOULDER SURGERY Left 12/26/014   Dr. Little Ishikawa, [place] N/A 03/28/2021   Procedure: THYROIDECTOMY, total;  Surgeon: Fredirick Maudlin, MD;  Location: [facility] ORS;  Service: General;  Laterality: N/A;  Provider requesting 3 hours /180 minutes for procedure   TONSILLECTOMY AND ADENOIDECTOMY     UTERINE FIBROID SURGERY     Patient Active Problem List   Diagnosis Date Noted   Need for influenza vaccination 09/10/2022   Primary insomnia 09/10/2022   Impaired functional mobility, balance, gait, and endurance 06/09/2022   Type 2 diabetes mellitus with diabetic neuropathy, without long-term current use of  insulin ([place]) 04/24/2022   Acquired hypothyroidism 04/24/2022   Hyperlipidemia associated with type 2 diabetes mellitus ([place]) 04/24/2022   Fall 04/24/2022   Dizziness 04/24/2022   Iron deficiency anemia 04/24/2022   Colon cancer screening 01/26/2022   Encounter for screening mammogram for malignant neoplasm of breast 01/26/2022   Chronic sensorimotor polyneuropathy with axonal and demyelinating features 01/12/2022   Osteoarthritis of knees (Bilateral) 01/12/2022   Tricompartment osteoarthritis of knees (Bilateral) 01/12/2022    Grade 1 Retrolisthesis (8m) of L5 over S1 01/12/2022   History of proximal humerus fracture (Left) 01/12/2022   Vitamin D deficiency 01/12/2022   Elevated hemoglobin A1c 01/12/2022   Hypochloremia 01/12/2022   Hyperglycemia 01/12/2022   Chronic peripheral neuropathic pain 01/12/2022   Type 2 diabetes mellitus with peripheral neuropathy ([place] 01/12/2022   Glands swollen 01/09/2022   Acute cough 01/09/2022   Acute non-recurrent pansinusitis 01/09/2022   Rhinorrhea 01/09/2022   Left acute otitis media 01/09/2022   Pharmacologic therapy 11/12/2021   Disorder of skeletal system 11/12/2021   Problems influencing health status 11/12/2021   Chronic hand pain (1ry area of Pain) (Bilateral) (L>R) 11/12/2021   Chronic feet pain (3ry area of Pain) (Bilateral) (L>R) 11/12/2021   Chronic shoulder pain (4th area of Pain) (Left) 11/12/2021   Chronic knee pain (5th area of Pain) (Bilateral) (L>R) 11/12/2021   Lumbar facet syndrome (Bilateral) 11/12/2021   Muscle spasm 10/22/2021   Type  2 diabetes mellitus with microalbuminuria, without long-term current use of insulin ([place]) 10/22/2021   Diabetic foot infection ([place]) 07/22/2021   Depression with anxiety 07/22/2021   Sepsis ([place]) 07/22/2021   COVID-19 virus infection 07/22/2021   Hypocalcemia 07/17/2021   Chronic pain syndrome 07/17/2021   S/P total thyroidectomy 03/28/2021   Thyroid nodule    Blue toe syndrome of left  lower extremity ([place]) 10/01/2020   Schamberg's purpura 10/09/2019   Leg swelling 10/09/2019   Benign positional vertigo 02/03/2016   Common migraine with intractable migraine 06/18/2015   Anxiety 05/04/2015   Adaptation reaction 05/03/2015   DDD (degenerative disc disease), lumbosacral 05/03/2015   Hypercholesteremia 05/03/2015   Insomnia due to medical condition 05/03/2015   Adiposity 05/03/2015   Disorder of peripheral nervous system 05/03/2015   Snores 05/03/2015   Diabetic peripheral neuropathy ([place]) 03/06/2015   Essential hypertension 03/06/2015   Elevated liver function tests 03/06/2015   Obesity 03/06/2015   GERD (gastroesophageal reflux disease) 03/06/2015   Multinodular goiter 03/06/2015   RLS (restless legs syndrome) 03/06/2015   Chronic low back pain (2ry area of Pain) (Bilateral) (L>R) w/o sciatica 03/06/2015   Leg cramps 03/06/2015    PCP: [name], FNP  REFERRING PROVIDER: [name], FNP  REFERRING DIAG: [phone] (ICD-10-CM) - Fall, subsequent encounter  THERAPY DIAG:  Other abnormalities of gait and mobility  Muscle weakness (generalized)  Difficulty in walking, not elsewhere classified  Rationale for Evaluation and Treatment Rehabilitation  ONSET DATE: 06/18/22  SUBJECTIVE:   SUBJECTIVE STATEMENT: Pt reports she has had continued discomfort in her LEs in area of her quad musculature at night particularly. Not sure if it is related to PT or not.   PERTINENT HISTORY: Pt reports her fall initially occurred in the evening when her husband was not home. Pt got up and had her Rolator and she walked toward her hallway. When she turned around to turn the light on she went completely down on her R leg and broke her femur distally and twisted her knee. When EMT arrived she had to straighten her LE to immobilize it. Pt went to [facility] for the trauma.   Upon arrival she was evaluated and had to get MRI and imaging. Progressed to 06/19/22. Pt had  surgery that night. Pt had to have plate and screws in her knee and rod in her femur. Pt was in hospital for 11 days then progressed to [facility] for 30 days. Pt [facility].   Upon coming home with [facility] pt came home with catheter. Pt came home and was relieved. Pt husband helps with everything doing "everything" for her except she helps with cooking.   Pt considered home health physical therapy but the cost of care would be too high ( $125 per visit) and this.   Prior to injury pt was ambulating with rolator. Pt used bedside commode at that time. Pt reports 6 falls during this year. Pt reports 2-3 falls with rolator and 2-3 falls walking between bathroom and bedroom and 1 fall in bedroom.   Pt had another fall at [facility] where she broke her left humerus about 2 years ago.   PAIN:  Are you having pain? No  PRECAUTIONS: Other: WBAT  WEIGHT BEARING RESTRICTIONS     Gardenia Phlegm, MD MPH - 09/03/2022 3:15 PM EDT   Formatting of this note might be different from the original.  At this time, you can  continue weightbearing as tolerated to right lower extremity. Please continue working on range of motion and strengthening. We have given you prescription for physical therapy.   FALLS:  Has patient fallen in last 6 months? Yes. 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 Pt does not know if she will be able to sit in one position for that long. Not sure if it will be feasible to get back to work.   PLOF: Requires assistive device for independence, Needs assistance with ADLs, Needs assistance  with homemaking, and Needs assistance with gait  PATIENT GOALS Pt would like to get legs moving a little bit more. Pt wants to be able to get to bathroom on her own.  Get upper body strength improved.      TODAY'S TREATMENT: TE  STS sabina transfers to/from [employer].   Level 1 on Nustep x 6 minutes with L hip abduction stabilizer donned and R LE braced with theraband to keep foot in place.   -with transfer from nustep to [facility] placed airex pad in [place] seat to ease difficulty of STS transfer   Forward leans x 10 to work on ant weight shift for STS  Maneuvered WC with foot rests removed to // bars.  TA STS transfer with max A x 2 for initial phase of standing and with min/mod A for maintenance of standing. Pt able to stand for 30 seconds on first round and 60 seconds on second round. Pt required assist at glutes to ensure forward weight shift in standing position. Pt also has assistance of B UE on // bars throughout standing duration.   LAQ 3 x 10 2# on R and AAROM on L no resistance.  Seated march 3 x 10 with 3# AW   HS curls GTB 3 x 15 RLE, 3 x 12 LLE YTB .    Glute set with manual sphygmotonometer under heel to provide feedback 2 x 10 x 5 sec   Forward ball roll outs for skin integrity and anterior weight shifting for future STS efforts. 2x10x5 sec holds with glute set at end range     Hip abduction with band RTB 2 x 10 seated.     PATIENT EDUCATION:  Education details: HEP, POC Person educated: Patient Education method: Explanation Education comprehension: verbalized understanding   HOME EXERCISE PROGRAM: Access Code: [email or web address] URL: [email or web address] Date: 09/28/2022 Prepared by: Rivka Barbara  Exercises - Seated March  - 1 x daily - 7 x weekly - 2 sets - 10 reps - Seated Long Arc Quad  - 1 x daily - 7 x weekly - 2 sets - 10 reps - Seated Heel Raise  - 1 x daily - 7 x weekly - 2 sets - 20 reps - Seated hip abduction with band  - 1 x daily - 7 x  weekly - 2 sets - 10 reps - Seated Hip Adduction Isometrics with Ball  - 1 x daily - 7 x weekly - 2 sets - 10 reps  ASSESSMENT:  CLINICAL IMPRESSION: Pt presents with great motivation for completion of PT activities. Pt able to stand with max A x 2 from PT this date  but was first time standing for 5 months. Pt emotional and happy with progress to this point. Pt still not able to perform standing in her home environment but the progress is encouraging. Pt will continue to benefit from skilled Pt interventions to improve LE strength, mobility and QOL.    OBJECTIVE IMPAIRMENTS Abnormal gait, decreased activity tolerance, decreased balance, decreased endurance, decreased mobility, difficulty walking, decreased ROM, decreased strength, impaired perceived functional ability, and impaired sensation.   ACTIVITY LIMITATIONS carrying, lifting, bending, standing, squatting, stairs, transfers, bed mobility, bathing, toileting, dressing, self feeding, hygiene/grooming, and locomotion level  PARTICIPATION LIMITATIONS: meal prep, cleaning, laundry, driving, shopping, community activity, occupation, and yard work  PERSONAL FACTORS 3+ comorbidities: Polyneuropathy, hypertension, hyperlipidemia, diabetes mellitus, arthritis  are also affecting patient's functional outcome.   REHAB POTENTIAL: Fair secondary to comorbidities above particularly polyneuropathy affecting her lower extremity strength and muscle activation  CLINICAL DECISION MAKING: Evolving/moderate complexity  EVALUATION COMPLEXITY: Moderate   GOALS: Goals reviewed with patient? Yes  SHORT TERM GOALS: Target date: 10/26/2022     Patient will be independent in home exercise program to improve strength/mobility for better functional independence with ADLs. Baseline: No HEP currently 11/15: Completing Hep regularly.  Goal status: Met    LONG TERM GOALS: Target date: 01/27/2023   Patient will be modified independent for slide board transfers  in order to improve her ability to transfer to and from wheelchair as well as bedside commode for improvement in ADLs. Baseline: Patient currently requires assistance with slide board transfers is unable to utilize bedside commode and requires use of catheter and bedpan 11/15: Pt completing slide board transfers and only requires assistance with placing slide board and maneuvering WC Goal status: IN PROGRESS  2.  Patient will perform sit to and from stand with min assist from physical therapist in order to indicate improved lower extremity functional strength and improved ability to perform functional transfers and functional leg activities within her home. Baseline: Patient unable to stand independently at this time due to lower extremity weakness 11/04/2022: Patient is able to perform anterior lean and pull on parallel bars and activate gluteal muscles was unable to lift weight from wheelchair Goal status: INITIAL  3.  Patient will improve focus on therapeutic outcome score to 35 or greater in order to indicate improved perceived functional ability Baseline: 19 11/15: 40 Goal status: MET  4.  Patient will perform stand pivot transfer from wheelchair to mat table with moderate assistance or less in order to improve her ability to perform transfers within the home to improve her overall functional capacity. Baseline: Unable to complete 11/04/2022: Patient is able to perform anterior lean and pull on parallel bars and activate gluteal muscles was unable to lift weight from wheelchair Goal status: INITIAL  4.  Pt will improve LEFS by 10 points or more in order to indicate improved LE function and mobility.  Baseline: 9 Goal status: INITIAL    PLAN: PT FREQUENCY: 2x/week  PT DURATION: 12 weeks  PLANNED INTERVENTIONS: Therapeutic exercises, Therapeutic activity, Neuromuscular re-education, Balance training, Gait training, Patient/Family education, Self Care, Joint mobilization, Stair training,  DME instructions, and Electrical stimulation  PLAN FOR NEXT SESSION: Nustep, standing in // bars as able, LE strength    Particia Lather PT  11/20/2022, 8:07 AM

## 2022-11-20 NOTE — Progress Notes (Signed)
Virtual Visit Consent   Gabriella Marsh, you are scheduled for a virtual visit with a [facility] provider today. Just as with appointments in the office, your consent must be obtained to participate. Your consent will be active for this visit and any virtual visit you may have with one of our providers in the next 365 days. If you have a MyChart account, a copy of this consent can be sent to you electronically.  As this is a virtual visit, video technology does not allow for your provider to perform a traditional examination. This may limit your provider's ability to fully assess your condition. If your provider identifies any concerns that need to be evaluated in person or the need to arrange testing (such as labs, EKG, etc.), we will make arrangements to do so. Although advances in technology are sophisticated, we cannot ensure that it will always work on either your end or our end. If the connection with a video visit is poor, the visit may have to be switched to a telephone visit. With either a video or telephone visit, we are not always able to ensure that we have a secure connection.  By engaging in this virtual visit, you consent to the provision of healthcare and authorize for your insurance to be billed (if applicable) for the services provided during this visit. Depending on your insurance coverage, you may receive a charge related to this service.  I need to obtain your verbal consent now. Are you willing to proceed with your visit today? Gabriella Marsh has provided verbal consent on 11/20/2022 for a virtual visit (video or telephone). [name], NP  Date: 11/20/2022 11:44 AM  Virtual Visit via Video Note   I, [name], connected with  Gabriella Marsh  ([id number], 29-Nov-1966) on 11/20/22 at  1:00 PM EST by a video-enabled telemedicine application and verified that I am speaking with the correct person using two identifiers.  Location: Patient: Virtual Visit  Location Patient: Home Provider: Virtual Visit Location Provider: Home Office   I discussed the limitations of evaluation and management by telemedicine and the availability of in person appointments. The patient expressed understanding and agreed to proceed.    History of Present Illness: Gabriella Marsh is a 56 y.o. who identifies as a female who was assigned female at birth, and is being seen today for head congestion- onset was Wednesday of this week- 3 days ago. Husband was diagnosed with Flu- this week.    Problems:  Patient Active Problem List   Diagnosis Date Noted   Need for influenza vaccination 09/10/2022   Primary insomnia 09/10/2022   Impaired functional mobility, balance, gait, and endurance 06/09/2022   Type 2 diabetes mellitus with diabetic neuropathy, without long-term current use of insulin ([place]) 04/24/2022   Acquired hypothyroidism 04/24/2022   Hyperlipidemia associated with type 2 diabetes mellitus ([place]) 04/24/2022   Fall 04/24/2022   Dizziness 04/24/2022   Iron deficiency anemia 04/24/2022   Colon cancer screening 01/26/2022   Encounter for screening mammogram for malignant neoplasm of breast 01/26/2022   Chronic sensorimotor polyneuropathy with axonal and demyelinating features 01/12/2022   Osteoarthritis of knees (Bilateral) 01/12/2022   Tricompartment osteoarthritis of knees (Bilateral) 01/12/2022    Grade 1 Retrolisthesis (76m) of L5 over S1 01/12/2022   History of proximal humerus fracture (Left) 01/12/2022   Vitamin D deficiency 01/12/2022   Elevated hemoglobin A1c 01/12/2022   Hypochloremia 01/12/2022   Hyperglycemia 01/12/2022   Chronic peripheral neuropathic pain  01/12/2022   Type 2 diabetes mellitus with peripheral neuropathy (HCC) 01/12/2022   Glands swollen 01/09/2022   Acute cough 01/09/2022   Acute non-recurrent pansinusitis 01/09/2022   Rhinorrhea 01/09/2022   Left acute otitis media 01/09/2022   Pharmacologic therapy 11/12/2021    Disorder of skeletal system 11/12/2021   Problems influencing health status 11/12/2021   Chronic hand pain (1ry area of Pain) (Bilateral) (L>R) 11/12/2021   Chronic feet pain (3ry area of Pain) (Bilateral) (L>R) 11/12/2021   Chronic shoulder pain (4th area of Pain) (Left) 11/12/2021   Chronic knee pain (5th area of Pain) (Bilateral) (L>R) 11/12/2021   Lumbar facet syndrome (Bilateral) 11/12/2021   Muscle spasm 10/22/2021   Type 2 diabetes mellitus with microalbuminuria, without long-term current use of insulin ([place]) 10/22/2021   Diabetic foot infection ([place]) 07/22/2021   Depression with anxiety 07/22/2021   Sepsis ([place]) 07/22/2021   COVID-19 virus infection 07/22/2021   Hypocalcemia 07/17/2021   Chronic pain syndrome 07/17/2021   S/P total thyroidectomy 03/28/2021   Thyroid nodule    Blue toe syndrome of left lower extremity ([place]) 10/01/2020   Schamberg's purpura 10/09/2019   Leg swelling 10/09/2019   Benign positional vertigo 02/03/2016   Common migraine with intractable migraine 06/18/2015   Anxiety 05/04/2015   Adaptation reaction 05/03/2015   DDD (degenerative disc disease), lumbosacral 05/03/2015   Hypercholesteremia 05/03/2015   Insomnia due to medical condition 05/03/2015   Adiposity 05/03/2015   Disorder of peripheral nervous system 05/03/2015   Snores 05/03/2015   Diabetic peripheral neuropathy ([place]) 03/06/2015   Essential hypertension 03/06/2015   Elevated liver function tests 03/06/2015   Obesity 03/06/2015   GERD (gastroesophageal reflux disease) 03/06/2015   Multinodular goiter 03/06/2015   RLS (restless legs syndrome) 03/06/2015   Chronic low back pain (2ry area of Pain) (Bilateral) (L>R) w/o sciatica 03/06/2015   Leg cramps 03/06/2015    Allergies:  Allergies  Allergen Reactions   Celecoxib Shortness Of Breath   Pregabalin Shortness Of Breath   Buspar [Buspirone] Other (See Comments)    Tachycardia   Citalopram Cough   Other Cough    [place] pear trees    Medications:  Current Outpatient Medications:    atorvastatin (LIPITOR) 20 MG tablet, TAKE 1 TABLET(20 MG) BY MOUTH DAILY, Disp: 90 tablet, Rfl: 0   clopidogrel (PLAVIX) 75 MG tablet, TAKE 1 TABLET(75 MG) BY MOUTH DAILY, Disp: 30 tablet, Rfl: 6   cyclobenzaprine (FLEXERIL) 5 MG tablet, Take 1 tablet (5 mg total) by mouth 3 (three) times daily as needed for muscle spasms., Disp: 270 tablet, Rfl: 1   fluticasone (FLONASE) 50 MCG/ACT nasal spray, Place 2 sprays into both nostrils daily., Disp: 16 g, Rfl: 6   furosemide (LASIX) 20 MG tablet, TAKE 2 TABLETS(40 MG) BY MOUTH DAILY AS NEEDED FOR FLUID RETENTION OR SWELLING, Disp: 180 tablet, Rfl: 3   gabapentin (NEURONTIN) 100 MG capsule, TAKE 2 CAPSULES THREE TIMES A DAY ALONG WITH 600 MG THREE TIMES A DAY FOR A TOTAL DAILY DOSE OF 2400 MG DAILY, Disp: 360 capsule, Rfl: 3   gabapentin (NEURONTIN) 600 MG tablet, TAKE 1 TABLET BY MOUTH 3 TIMES  DAILY, Disp: 270 tablet, Rfl: 3   Insulin Pen Needle 31G X 5 MM MISC, 1 each by Does not apply route daily., Disp: 100 each, Rfl: 1   LANTUS SOLOSTAR 100 UNIT/ML Solostar Pen, INJECT 20 UNITS UNDER THE SKIN AT BEDTIME, TITRATE TO FASTING SUGAR OF 130-180 AS DIRECTED, Disp: 15 mL, Rfl: 1  levothyroxine (SYNTHROID) 200 MCG tablet, Take 1 tablet (200 mcg total) by mouth daily., Disp: 30 tablet, Rfl: 0   levothyroxine (SYNTHROID) 25 MCG tablet, Take 1 tablet (25 mcg total) by mouth daily. Please take WITH your 200 mcg dose. Recommend labs in 6-8 weeks., Disp: 90 tablet, Rfl: 0   lisinopril (ZESTRIL) 5 MG tablet, TAKE 1 TABLET(5 MG) BY MOUTH DAILY, Disp: 90 tablet, Rfl: 1   Melatonin 10 MG TABS, Take 10 mg by mouth at bedtime., Disp: , Rfl:    metFORMIN (GLUCOPHAGE) 500 MG tablet, Take 2 tablets (1,000 mg total) by mouth 2 (two) times daily with a meal., Disp: 360 tablet, Rfl: 3   nortriptyline (PAMELOR) 25 MG capsule, TAKE 2 CAPSULES BY MOUTH AT  BEDTIME, Disp: 180 capsule, Rfl: 3   omeprazole (PRILOSEC) 20 MG  capsule, TAKE 1 CAPSULE(20 MG) BY MOUTH DAILY, Disp: 90 capsule, Rfl: 0   oxyCODONE-acetaminophen (PERCOCET) 5-325 MG tablet, Take 1 tablet by mouth every 8 (eight) hours as needed for severe pain. Must last 30 days., Disp: 90 tablet, Rfl: 0   oxyCODONE-acetaminophen (PERCOCET) 5-325 MG tablet, Take 1 tablet by mouth every 8 (eight) hours as needed for severe pain. Must last 30 days., Disp: 90 tablet, Rfl: 0   [START ON 12/09/2022] oxyCODONE-acetaminophen (PERCOCET) 5-325 MG tablet, Take 1-2 tablets by mouth every 12 (twelve) hours as needed for severe pain. Must last 30 days., Disp: 75 tablet, Rfl: 0   traZODone (DESYREL) 50 MG tablet, Take 2 tablets (100 mg total) by mouth at bedtime., Disp: 60 tablet, Rfl: 0   triamterene-hydrochlorothiazide (MAXZIDE-25) 37.5-25 MG tablet, TAKE 1 TABLET BY MOUTH  DAILY, Disp: 90 tablet, Rfl: 3  Observations/Objective: Patient is well-developed, well-nourished in no acute distress.  Resting comfortably  at home.  Head is normocephalic, atraumatic.  No labored breathing.  Speech is clear and coherent with logical content.  Patient is alert and oriented at baseline.  Nasal tone noted  Assessment and Plan:  1. Flu-like symptoms  - oseltamivir (TAMIFLU) 75 MG capsule; Take 1 capsule (75 mg total) by mouth 2 (two) times daily for 5 days.  Dispense: 10 capsule; Refill: 0  -Take meds as prescribed -Rest -flonase -Use a cool mist humidifier especially during the winter months when heat dries out the air. - Use saline nose sprays frequently to help soothe nasal passages and promote drainage. -stay hydrated by drinking plenty of fluids - Keep thermostat turn down low to prevent drying out sinuses - For any cough or congestion- robitussin DM or Delsym as needed - For fever or aches or pains- take tylenol or ibuprofen as directed on bottle             * for fevers greater than 101 orally you may alternate ibuprofen and tylenol every 3 hours.  If you do not  improve you will need a follow up visit in person.               Reviewed side effects, risks and benefits of medication.    Patient acknowledged agreement and understanding of the plan.   Past Medical, Surgical, Social History, Allergies, and Medications have been Reviewed.   Follow Up Instructions: I discussed the assessment and treatment plan with the patient. The patient was provided an opportunity to ask questions and all were answered. The patient agreed with the plan and demonstrated an understanding of the instructions.  A copy of instructions were sent to the patient via MyChart unless otherwise noted below.  The patient was advised to call back or seek an in-person evaluation if the symptoms worsen or if the condition fails to improve as anticipated.  Time:  I spent 9  minutes with the patient via telehealth technology discussing the above problems/concerns.    [name], NP

## 2022-11-20 NOTE — Patient Instructions (Signed)
Visit Information  Thank you for taking time to visit with me today. Please don't hesitate to contact me if I can be of assistance to you before our next scheduled telephone appointment.  Following are the goals we discussed today:  Call or send message in my chart to PCP regarding concern for sinus infection.  Notify them husband was recently diagnoses with flu.  Our next appointment is by telephone on 12/14  Please call the care guide team at [phone] if you need to cancel or reschedule your appointment.   Please call the Suicide and Crisis Lifeline: 988 call the [place] National Suicide Prevention Lifeline: 9125510158 or TTY: (765)355-0793 TTY 657 110 6409) to talk to a trained counselor call 1-800-273-TALK (toll free, 24 hour hotline) call 911 if you are experiencing a Mental Health or [place] or need someone to talk to.  Patient verbalizes understanding of instructions and care plan provided today and agrees to view in [facility]. Active MyChart status and patient understanding of how to access instructions and care plan via MyChart confirmed with patient.     The patient has been provided with contact information for the care management team and has been advised to call with any health related questions or concerns.   Valente David, RN, MSN, [facility] Care Management Care Management Coordinator [phone]

## 2022-11-20 NOTE — Patient Instructions (Signed)
Gabriella Marsh, thank you for joining [name], NP for today's virtual visit.  While this provider is not your primary care provider (PCP), if your PCP is located in our provider database this encounter information will be shared with them immediately following your visit.   [facility] account gives you access to today's visit and all your visits, tests, and labs performed at [facility] " click here if you don't have a [facility] account or go to [email or web address]  Consent: (Patient) Gabriella Marsh provided verbal consent for this virtual visit at the beginning of the encounter.  Current Medications:  Current Outpatient Medications:    oseltamivir (TAMIFLU) 75 MG capsule, Take 1 capsule (75 mg total) by mouth 2 (two) times daily for 5 days., Disp: 10 capsule, Rfl: 0   atorvastatin (LIPITOR) 20 MG tablet, TAKE 1 TABLET(20 MG) BY MOUTH DAILY, Disp: 90 tablet, Rfl: 0   clopidogrel (PLAVIX) 75 MG tablet, TAKE 1 TABLET(75 MG) BY MOUTH DAILY, Disp: 30 tablet, Rfl: 6   cyclobenzaprine (FLEXERIL) 5 MG tablet, Take 1 tablet (5 mg total) by mouth 3 (three) times daily as needed for muscle spasms., Disp: 270 tablet, Rfl: 1   fluticasone (FLONASE) 50 MCG/ACT nasal spray, Place 2 sprays into both nostrils daily., Disp: 16 g, Rfl: 6   furosemide (LASIX) 20 MG tablet, TAKE 2 TABLETS(40 MG) BY MOUTH DAILY AS NEEDED FOR FLUID RETENTION OR SWELLING, Disp: 180 tablet, Rfl: 3   gabapentin (NEURONTIN) 100 MG capsule, TAKE 2 CAPSULES THREE TIMES A DAY ALONG WITH 600 MG THREE TIMES A DAY FOR A TOTAL DAILY DOSE OF 2400 MG DAILY, Disp: 360 capsule, Rfl: 3   gabapentin (NEURONTIN) 600 MG tablet, TAKE 1 TABLET BY MOUTH 3 TIMES  DAILY, Disp: 270 tablet, Rfl: 3   Insulin Pen Needle 31G X 5 MM MISC, 1 each by Does not apply route daily., Disp: 100 each, Rfl: 1   LANTUS SOLOSTAR 100 UNIT/ML Solostar Pen, INJECT 20 UNITS UNDER THE SKIN AT BEDTIME, TITRATE TO FASTING  SUGAR OF 130-180 AS DIRECTED, Disp: 15 mL, Rfl: 1   levothyroxine (SYNTHROID) 200 MCG tablet, Take 1 tablet (200 mcg total) by mouth daily., Disp: 30 tablet, Rfl: 0   levothyroxine (SYNTHROID) 25 MCG tablet, Take 1 tablet (25 mcg total) by mouth daily. Please take WITH your 200 mcg dose. Recommend labs in 6-8 weeks., Disp: 90 tablet, Rfl: 0   lisinopril (ZESTRIL) 5 MG tablet, TAKE 1 TABLET(5 MG) BY MOUTH DAILY, Disp: 90 tablet, Rfl: 1   Melatonin 10 MG TABS, Take 10 mg by mouth at bedtime., Disp: , Rfl:    metFORMIN (GLUCOPHAGE) 500 MG tablet, Take 2 tablets (1,000 mg total) by mouth 2 (two) times daily with a meal., Disp: 360 tablet, Rfl: 3   nortriptyline (PAMELOR) 25 MG capsule, TAKE 2 CAPSULES BY MOUTH AT  BEDTIME, Disp: 180 capsule, Rfl: 3   omeprazole (PRILOSEC) 20 MG capsule, TAKE 1 CAPSULE(20 MG) BY MOUTH DAILY, Disp: 90 capsule, Rfl: 0   oxyCODONE-acetaminophen (PERCOCET) 5-325 MG tablet, Take 1 tablet by mouth every 8 (eight) hours as needed for severe pain. Must last 30 days., Disp: 90 tablet, Rfl: 0   oxyCODONE-acetaminophen (PERCOCET) 5-325 MG tablet, Take 1 tablet by mouth every 8 (eight) hours as needed for severe pain. Must last 30 days., Disp: 90 tablet, Rfl: 0   [START ON 12/09/2022] oxyCODONE-acetaminophen (PERCOCET) 5-325 MG tablet, Take 1-2 tablets by mouth every 12 (twelve) hours as  needed for severe pain. Must last 30 days., Disp: 75 tablet, Rfl: 0   traZODone (DESYREL) 50 MG tablet, Take 2 tablets (100 mg total) by mouth at bedtime., Disp: 60 tablet, Rfl: 0   triamterene-hydrochlorothiazide (MAXZIDE-25) 37.5-25 MG tablet, TAKE 1 TABLET BY MOUTH  DAILY, Disp: 90 tablet, Rfl: 3   Medications ordered in this encounter:  Meds ordered this encounter  Medications   oseltamivir (TAMIFLU) 75 MG capsule    Sig: Take 1 capsule (75 mg total) by mouth 2 (two) times daily for 5 days.    Dispense:  10 capsule    Refill:  0    Order Specific Question:   Supervising Provider    Answer:    Chase Picket [id number]     *If you need refills on other medications prior to your next appointment, please contact your pharmacy*  Follow-Up: Call back or seek an in-person evaluation if the symptoms worsen or if the condition fails to improve as anticipated.  [facility] [phone]  Other Instructions Influenza, Adult Influenza, also called "the flu," is a viral infection that mainly affects the respiratory tract. This includes the lungs, nose, and throat. The flu spreads easily from person to person (is contagious). It causes common cold symptoms, along with high fever and body aches. What are the causes? This condition is caused by the influenza virus. You can get the virus by: Breathing in droplets that are in the air from an infected person's cough or sneeze. Touching something that has the virus on it (has been contaminated) and then touching your mouth, nose, or eyes. What increases the risk? The following factors may make you more likely to get the flu: Not washing or sanitizing your hands often. Having close contact with many people during cold and flu season. Touching your mouth, eyes, or nose without first washing or sanitizing your hands. Not getting an annual flu shot. You may have a higher risk for the flu, including serious problems, such as a lung infection (pneumonia), if you: Are older than 65. Are pregnant. Have a weakened disease-fighting system (immune system). This includes people who have HIV or AIDS, are on chemotherapy, or are taking medicines that reduce (suppress) the immune system. Have a long-term (chronic) illness, such as heart disease, kidney disease, diabetes, or lung disease. Have a liver disorder. Are severely overweight (morbidly obese). Have anemia. Have asthma. What are the signs or symptoms? Symptoms of this condition usually begin suddenly and last 4-14 days. These may include: Fever and chills. Headaches, body aches,  or muscle aches. Sore throat. Cough. Runny or stuffy (congested) nose. Chest discomfort. Poor appetite. Weakness or fatigue. Dizziness. Nausea or vomiting. How is this diagnosed? This condition may be diagnosed based on: Your symptoms and medical history. A physical exam. Swabbing your nose or throat and testing the fluid for the influenza virus. How is this treated? If the flu is diagnosed early, you can be treated with antiviral medicine that is given by mouth (orally) or through an IV. This can help reduce how severe the illness is and how long it lasts. Taking care of yourself at home can help relieve symptoms. Your health care provider may recommend: Taking over-the-counter medicines. Drinking plenty of fluids. In many cases, the flu goes away on its own. If you have severe symptoms or complications, you may be treated in a hospital. Follow these instructions at home: Activity Rest as needed and get plenty of sleep. Stay home from work  or school as told by your health care provider. Unless you are visiting your health care provider, avoid leaving home until your fever has been gone for 24 hours without taking medicine. Eating and drinking Take an oral rehydration solution (ORS). This is a drink that is sold at pharmacies and retail stores. Drink enough fluid to keep your urine pale yellow. Drink clear fluids in small amounts as you are able. Clear fluids include water, ice chips, fruit juice mixed with water, and low-calorie sports drinks. Eat bland, easy-to-digest foods in small amounts as you are able. These foods include bananas, applesauce, rice, lean meats, toast, and crackers. Avoid drinking fluids that contain a lot of sugar or caffeine, such as energy drinks, regular sports drinks, and soda. Avoid alcohol. Avoid spicy or fatty foods. General instructions     Take over-the-counter and prescription medicines only as told by your health care provider. Use a cool mist  humidifier to add humidity to the air in your home. This can make it easier to breathe. When using a cool mist humidifier, clean it daily. Empty the water and replace it with clean water. Cover your mouth and nose when you cough or sneeze. Wash your hands with soap and water often and for at least 20 seconds, especially after you cough or sneeze. If soap and water are not available, use alcohol-based hand sanitizer. Keep all follow-up visits. This is important. How is this prevented?  Get an annual flu shot. This is usually available in late summer, fall, or winter. Ask your health care provider when you should get your flu shot. Avoid contact with people who are sick during cold and flu season. This is generally fall and winter. Contact a health care provider if: You develop new symptoms. You have: Chest pain. Diarrhea. A fever. Your cough gets worse. You produce more mucus. You feel nauseous or you vomit. Get help right away if you: Develop shortness of breath or have difficulty breathing. Have skin or nails that turn a bluish color. Have severe pain or stiffness in your neck. Develop a sudden headache or sudden pain in your face or ear. Cannot eat or drink without vomiting. These symptoms may represent a serious problem that is an emergency. Do not wait to see if the symptoms will go away. Get medical help right away. Call your local emergency services (911 in the [place] Do not drive yourself to the hospital. Summary Influenza, also called "the flu," is a viral infection that primarily affects your respiratory tract. Symptoms of the flu usually begin suddenly and last 4-14 days. Getting an annual flu shot is the best way to prevent getting the flu. Stay home from work or school as told by your health care provider. Unless you are visiting your health care provider, avoid leaving home until your fever has been gone for 24 hours without taking medicine. Keep all follow-up visits. This  is important. This information is not intended to replace advice given to you by your health care provider. Make sure you discuss any questions you have with your health care provider. Document Revised: 07/26/2020 Document Reviewed: 07/26/2020 Elsevier Patient Education  [place].    If you have been instructed to have an in-person evaluation today at a local Urgent Care facility, please use the link below. It will take you to a list of all of our available [facility], including address, phone number and hours of operation. Please do not delay care.  [facility]  If you or a family member do not have a primary care provider, use the link below to schedule a visit and establish care. When you choose a [facility] primary care physician or advanced practice provider, you gain a long-term partner in health. Find a Primary Care Provider  Learn more about [facility]'s in-office and virtual care options: [facility]

## 2022-11-20 NOTE — Patient Outreach (Signed)
  Care Coordination   Follow Up Visit Note   11/20/2022 Name: Gabriella Marsh MRN: [id number] DOB: 11/13/1966  Gabriella Marsh is a 56 y.o. year old female who sees [name], FNP for primary care. I spoke with  [name] by phone today.  What matters to the patients health and wellness today?  Feels she may have a sinus infection.  Husband was recently diagnosed with the flu, she has not had any fever.  She will reach out to PCP for possible virtual visit.     Goals Addressed             This Visit's Progress    RNCM: "My biggest concern is my mobility"   On track    Care Coordination Interventions: Evaluation of current treatment plan related to mobility issues, multiple factors impacting her health and well being, currently concern for sinus infection and patient's adherence to plan as established by provider Advised patient to call the office for changes in mobility, decreased functionality of extremities, uncontrolled pain and discomfort, new falls Provided education to patient re: support and education of the care coordination program Reviewed medications with patient and discussed compliance and affordability Provided patient with sinus infection and flu educational materials related to ongoing recovery for maintaining activity and mobility Reviewed scheduled/upcoming provider appointments including 12-09-2022 at 1040 am Discussed plans with patient for ongoing care management follow up and provided patient with direct contact information for care management team Advised patient to discuss new concerns related to mobility and PT options  with provider Screening for signs and symptoms of depression related to chronic disease state  Assessed social determinant of health barriers The patient was able to start outpatient PT sessions. State she had to cancel this week due to no feeling well  Has help in the home 2 hours a day and another helper that takes  her to appointments. Her husband is her biggest support and help.   Active listening / Reflection utilized  Emotional Support Provided      RNCM: Effective Management of DM   On track    Care Coordination Interventions:  Lab Results  Component Value Date   HGBA1C 8.6 (A) 09/10/2022    Provided education to patient about basic DM disease process Reviewed medications with patient and discussed importance of medication adherence.  Counseled on importance of regular laboratory monitoring as prescribed Discussed plans with patient for ongoing care management follow up and provided patient with direct contact information for care management team Reviewed scheduled/upcoming provider appointments including: 12-09-2022 at 1040 am Advised patient, providing education and rationale, to check cbg as directed and record, calling pcp for findings outside established parameters. The patient is not checking her blood sugars. The patient states that it hurts. Education provided.  Review of patient status, including review of consultants reports, relevant laboratory and other test results, and medications completed The patient agrees to the care coordination program. Review of the goals of care and the program criteria. The patient given the direct number to call the [facility]. Education and support given           SDOH assessments and interventions completed:  No     Care Coordination Interventions:  Yes, provided   Follow up plan: Follow up call scheduled for 12/14    Encounter Outcome:  Pt. Visit Completed   Valente David, RN, MSN, [facility] Care Management Care Management Coordinator [phone]

## 2022-11-23 ENCOUNTER — Encounter: Payer: Self-pay | Admitting: Physical Therapy

## 2022-11-23 ENCOUNTER — Ambulatory Visit: Payer: BLUE CROSS/BLUE SHIELD | Attending: Orthopedic Surgery | Admitting: Physical Therapy

## 2022-11-23 DIAGNOSIS — R2689 Other abnormalities of gait and mobility: Secondary | ICD-10-CM | POA: Insufficient documentation

## 2022-11-23 DIAGNOSIS — M6281 Muscle weakness (generalized): Secondary | ICD-10-CM | POA: Insufficient documentation

## 2022-11-23 DIAGNOSIS — R262 Difficulty in walking, not elsewhere classified: Secondary | ICD-10-CM | POA: Diagnosis present

## 2022-11-23 NOTE — Therapy (Signed)
OUTPATIENT PHYSICAL THERAPY LOWER EXTREMITY TREATMENT    Patient Name: Gabriella Marsh MRN: [id number] DOB:07/21/1966, 56 y.o., female Today's Date: 11/23/2022   PT End of Session - 11/23/22 1600     Visit Number 12    Number of Visits 24    Date for PT Re-Evaluation 12/21/22    Authorization Type BCBS Other    Authorization Time Period 09/28/22-12/21/22    Progress Note Due on Visit 20    PT Start Time 1602    PT Stop Time 1660    PT Time Calculation (min) 42 min    Equipment Utilized During Treatment Gait belt    Activity Tolerance Patient tolerated treatment well;No increased pain    Behavior During Therapy WFL for tasks assessed/performed                   Past Medical History:  Diagnosis Date   Arthritis    knees   Blood clotting disorder ([place])    on toe    Degenerative disc disease, lumbar    Diabetes mellitus without complication (HCC)    GERD (gastroesophageal reflux disease)    Headache    daily - AM (has not been able to have SPG blocks lately)   Hyperlipidemia    Hypertension    Neuropathy    feet   Vertigo    3-4x/yr   Past Surgical History:  Procedure Laterality Date   ABDOMINAL HYSTERECTOMY     But still has cervix   AMPUTATION TOE Right 07/24/2021   Procedure: AMPUTATION TOE;  Surgeon: Sharlotte Alamo, DPM;  Location: [facility] ORS;  Service: Podiatry;  Laterality: Right;   CESAREAN SECTION     CHOLECYSTECTOMY     COLONOSCOPY WITH PROPOFOL N/A 02/20/2022   Procedure: COLONOSCOPY WITH PROPOFOL;  Surgeon: [name], MD;  Location: [facility] ENDOSCOPY;  Service: Gastroenterology;  Laterality: N/A;   left arm frature  10/12/2020   OVARIAN CYST SURGERY     PARATHYROIDECTOMY  03/28/2021   Procedure: PARATHYROIDECTOMY AUTOTRANSPLANT;  Surgeon: Fredirick Maudlin, MD;  Location: [facility] ORS;  Service: General;;   right femur surgery Right    SHOULDER SURGERY Left 12/26/014   Dr. Little Ishikawa, [place] N/A 03/28/2021   Procedure:  THYROIDECTOMY, total;  Surgeon: Fredirick Maudlin, MD;  Location: [facility] ORS;  Service: General;  Laterality: N/A;  Provider requesting 3 hours /180 minutes for procedure   TONSILLECTOMY AND ADENOIDECTOMY     UTERINE FIBROID SURGERY     Patient Active Problem List   Diagnosis Date Noted   Need for influenza vaccination 09/10/2022   Primary insomnia 09/10/2022   Impaired functional mobility, balance, gait, and endurance 06/09/2022   Type 2 diabetes mellitus with diabetic neuropathy, without long-term current use of insulin ([place]) 04/24/2022   Acquired hypothyroidism 04/24/2022   Hyperlipidemia associated with type 2 diabetes mellitus ([place]) 04/24/2022   Fall 04/24/2022   Dizziness 04/24/2022   Iron deficiency anemia 04/24/2022   Colon cancer screening 01/26/2022   Encounter for screening mammogram for malignant neoplasm of breast 01/26/2022   Chronic sensorimotor polyneuropathy with axonal and demyelinating features 01/12/2022   Osteoarthritis of knees (Bilateral) 01/12/2022   Tricompartment osteoarthritis of knees (Bilateral) 01/12/2022    Grade 1 Retrolisthesis (83m) of L5 over S1 01/12/2022   History of proximal humerus fracture (Left) 01/12/2022   Vitamin D deficiency 01/12/2022   Elevated hemoglobin A1c 01/12/2022   Hypochloremia 01/12/2022   Hyperglycemia 01/12/2022   Chronic peripheral neuropathic pain 01/12/2022   Type  2 diabetes mellitus with peripheral neuropathy (HCC) 01/12/2022   Glands swollen 01/09/2022   Acute cough 01/09/2022   Acute non-recurrent pansinusitis 01/09/2022   Rhinorrhea 01/09/2022   Left acute otitis media 01/09/2022   Pharmacologic therapy 11/12/2021   Disorder of skeletal system 11/12/2021   Problems influencing health status 11/12/2021   Chronic hand pain (1ry area of Pain) (Bilateral) (L>R) 11/12/2021   Chronic feet pain (3ry area of Pain) (Bilateral) (L>R) 11/12/2021   Chronic shoulder pain (4th area of Pain) (Left) 11/12/2021   Chronic knee pain  (5th area of Pain) (Bilateral) (L>R) 11/12/2021   Lumbar facet syndrome (Bilateral) 11/12/2021   Muscle spasm 10/22/2021   Type 2 diabetes mellitus with microalbuminuria, without long-term current use of insulin ([place]) 10/22/2021   Diabetic foot infection ([place]) 07/22/2021   Depression with anxiety 07/22/2021   Sepsis ([place]) 07/22/2021   COVID-19 virus infection 07/22/2021   Hypocalcemia 07/17/2021   Chronic pain syndrome 07/17/2021   S/P total thyroidectomy 03/28/2021   Thyroid nodule    Blue toe syndrome of left lower extremity ([place]) 10/01/2020   Schamberg's purpura 10/09/2019   Leg swelling 10/09/2019   Benign positional vertigo 02/03/2016   Common migraine with intractable migraine 06/18/2015   Anxiety 05/04/2015   Adaptation reaction 05/03/2015   DDD (degenerative disc disease), lumbosacral 05/03/2015   Hypercholesteremia 05/03/2015   Insomnia due to medical condition 05/03/2015   Adiposity 05/03/2015   Disorder of peripheral nervous system 05/03/2015   Snores 05/03/2015   Diabetic peripheral neuropathy ([place]) 03/06/2015   Essential hypertension 03/06/2015   Elevated liver function tests 03/06/2015   Obesity 03/06/2015   GERD (gastroesophageal reflux disease) 03/06/2015   Multinodular goiter 03/06/2015   RLS (restless legs syndrome) 03/06/2015   Chronic low back pain (2ry area of Pain) (Bilateral) (L>R) w/o sciatica 03/06/2015   Leg cramps 03/06/2015    PCP: [name], FNP  REFERRING PROVIDER: [name], FNP  REFERRING DIAG: [id number] (ICD-10-CM) - Fall, subsequent encounter  THERAPY DIAG:  Other abnormalities of gait and mobility  Difficulty in walking, not elsewhere classified  Rationale for Evaluation and Treatment Rehabilitation  ONSET DATE: 06/18/22  SUBJECTIVE:   SUBJECTIVE STATEMENT: Pt reports continued aches and pains in her LE. Pt reports her husband had flu and she thought she had it but was very sick last week. Pt reports her symptoms were  mimicking the Flu with coughing and achiness. Pt took tamaflu. Pt had vaccines for flu and COVID. Pt reports she will lose her health insurance benefits at the end of the year. Pt will have a new insurance next year and expresses concerns with this as she will have a copay at that time.   PERTINENT HISTORY: Pt reports her fall initially occurred in the evening when her husband was not home. Pt got up and had her Rolator and she walked toward her hallway. When she turned around to turn the light on she went completely down on her R leg and broke her femur distally and twisted her knee. When EMT arrived she had to straighten her LE to immobilize it. Pt went to [facility] hospital for the trauma.   Upon arrival she was evaluated and had to get MRI and imaging. Progressed to 06/19/22. Pt had surgery that night. Pt had to have plate and screws in her knee and rod in her femur. Pt was in hospital for 11 days then progressed to [facility] for 30 days. Pt had rough time at [facility].   Upon coming home with [facility] pt came home with catheter. Pt came home and was relieved. Pt husband helps with everything doing "everything" for her except she helps with cooking.   Pt considered home health physical therapy but the cost of care would be too high ( $125 per visit) and this.   Prior to injury pt was ambulating with rolator. Pt used bedside commode at that time. Pt reports 6 falls during this year. Pt reports 2-3 falls with rolator and 2-3 falls walking between bathroom and bedroom and 1 fall in bedroom.   Pt had another fall at [place] where she broke her left humerus about 2 years ago.   PAIN:  Are you having pain? No  PRECAUTIONS: Other: WBAT  WEIGHT BEARING RESTRICTIONS     Gardenia Phlegm, MD MPH - 09/03/2022 3:15 PM EDT   Formatting of this note might be different from the original.  At this time, you can continue weightbearing as tolerated to right lower extremity. Please  continue working on range of motion and strengthening. We have given you prescription for physical therapy.   FALLS:  Has patient fallen in last 6 months? Yes. 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 Pt does not know if she will be able to sit in one position for that long. Not sure if it will be feasible to get back to work.   PLOF: Requires assistive device for independence, Needs assistance with ADLs, Needs assistance with homemaking, and Needs assistance with gait  PATIENT GOALS Pt would like to get legs moving a little bit more. Pt wants to be able to get to bathroom on her own.  Get upper body strength improved.      TODAY'S TREATMENT: TE  STS sabina transfers to/from [employer].     Forward leans x 10 to work on ant weight shift for STS  Maneuvered WC with foot rests removed to // bars.    LAQ 3 x 10,  3# on R and AAROM on L no resistance.  Seated march 3 x 10 with 3# AW   Heel to toe raises seated with feet on rocker board 3 x 10   HS curls GTB 3 x 15 RLE, 3 x 12 LLE YTB .    Glute set with manual sphygmotonometer under heel to provide feedback 2 x 10 x 5 sec   Forward ball roll outs for skin integrity and anterior weight shifting for future STS efforts. 2x10x5 sec holds with glute set at end range   Level 1 on Nustep x 6 minutes with L hip abduction stabilizer donned and R LE braced with theraband to keep foot in place.  -with transfer from nustep to [facility] placed airex pad in [place] seat to ease difficulty of STS transfer       PATIENT EDUCATION:  Education details: HEP, POC Person educated:  Patient Education method: Explanation Education comprehension: verbalized understanding   HOME EXERCISE PROGRAM: Access Code: [email or web address] URL: [email or web address] Date: 09/28/2022 Prepared by: Rivka Barbara  Exercises - Seated March  - 1 x daily - 7 x weekly - 2 sets -  10 reps - Seated Long Arc Quad  - 1 x daily - 7 x weekly - 2 sets - 10 reps - Seated Heel Raise  - 1 x daily - 7 x weekly - 2 sets - 20 reps - Seated hip abduction with band  - 1 x daily - 7 x weekly - 2 sets - 10 reps - Seated Hip Adduction Isometrics with Ball  - 1 x daily - 7 x weekly - 2 sets - 10 reps  ASSESSMENT:  CLINICAL IMPRESSION: Pt presents with great motivation for completion of PT activities. Pt coming off recent illness so higher intensity activities ( standing) were deferred in order to ensure pt fully recovers from recent illness with flu like symptoms. Pt continues to progress with LE strength and shows improved muscle activation but continue profound muscle weakness in her Les. Pt will continue to benefit from skilled physical therapy intervention to address impairments, improve QOL, and attain therapy goals.     OBJECTIVE IMPAIRMENTS Abnormal gait, decreased activity tolerance, decreased balance, decreased endurance, decreased mobility, difficulty walking, decreased ROM, decreased strength, impaired perceived functional ability, and impaired sensation.   ACTIVITY LIMITATIONS carrying, lifting, bending, standing, squatting, stairs, transfers, bed mobility, bathing, toileting, dressing, self feeding, hygiene/grooming, and locomotion level  PARTICIPATION LIMITATIONS: meal prep, cleaning, laundry, driving, shopping, community activity, occupation, and yard work  PERSONAL FACTORS 3+ comorbidities: Polyneuropathy, hypertension, hyperlipidemia, diabetes mellitus, arthritis  are also affecting patient's functional outcome.   REHAB POTENTIAL: Fair secondary to comorbidities above particularly  polyneuropathy affecting her lower extremity strength and muscle activation  CLINICAL DECISION MAKING: Evolving/moderate complexity  EVALUATION COMPLEXITY: Moderate   GOALS: Goals reviewed with patient? Yes  SHORT TERM GOALS: Target date: 10/26/2022     Patient will be independent in home exercise program to improve strength/mobility for better functional independence with ADLs. Baseline: No HEP currently 11/15: Completing Hep regularly.  Goal status: Met    LONG TERM GOALS: Target date: 01/27/2023   Patient will be modified independent for slide board transfers in order to improve her ability to transfer to and from wheelchair as well as bedside commode for improvement in ADLs. Baseline: Patient currently requires assistance with slide board transfers is unable to utilize bedside commode and requires use of catheter and bedpan 11/15: Pt completing slide board transfers and only requires assistance with placing slide board and maneuvering WC Goal status: IN PROGRESS  2.  Patient will perform sit to and from stand with min assist from physical therapist in order to indicate improved lower extremity functional strength and improved ability to perform functional transfers and functional leg activities within her home. Baseline: Patient unable to stand independently at this time due to lower extremity weakness 11/04/2022: Patient is able to perform anterior lean and pull on parallel bars and activate gluteal muscles was unable to lift weight from wheelchair Goal status: INITIAL  3.  Patient will improve focus on therapeutic outcome score to 35 or greater in order to indicate improved perceived functional ability Baseline: 19 11/15: 40 Goal status: MET  4.  Patient will perform stand pivot transfer from wheelchair to mat table with moderate assistance or less in order to improve her ability to perform transfers within the home to improve her overall functional capacity. Baseline: Unable to  complete 11/04/2022: Patient is able to perform anterior lean and pull on parallel bars and activate gluteal muscles was unable to lift weight from wheelchair Goal status: INITIAL  4.  Pt will improve LEFS by  10 points or more in order to indicate improved LE function and mobility.  Baseline: 9 Goal status: INITIAL    PLAN: PT FREQUENCY: 2x/week  PT DURATION: 12 weeks  PLANNED INTERVENTIONS: Therapeutic exercises, Therapeutic activity, Neuromuscular re-education, Balance training, Gait training, Patient/Family education, Self Care, Joint mobilization, Stair training, DME instructions, and Electrical stimulation  PLAN FOR NEXT SESSION: Nustep, standing in // bars as able, LE strength    Particia Lather PT  11/23/2022, 4:59 PM

## 2022-11-24 ENCOUNTER — Encounter: Payer: Self-pay | Admitting: Family Medicine

## 2022-11-24 ENCOUNTER — Ambulatory Visit (INDEPENDENT_AMBULATORY_CARE_PROVIDER_SITE_OTHER): Payer: BLUE CROSS/BLUE SHIELD | Admitting: Physician Assistant

## 2022-11-24 ENCOUNTER — Ambulatory Visit: Payer: BLUE CROSS/BLUE SHIELD | Admitting: Physician Assistant

## 2022-11-24 DIAGNOSIS — R339 Retention of urine, unspecified: Secondary | ICD-10-CM | POA: Diagnosis not present

## 2022-11-24 NOTE — Progress Notes (Signed)
Cath Change/ Replacement  Patient is present today for a catheter change due to urinary retention.  13m of water was removed from the balloon, a 16FR foley cath was removed without difficulty.  Patient was cleaned and prepped in a sterile fashion with betadine and 2% lidocaine jelly was instilled into the urethra. A 16 FR foley cath was replaced into the bladder, no complications were noted. Urine return was noted minimal and urine was yellow in color. The balloon was filled with 127mof sterile water. A leg bag was attached for drainage.  A night bag was also given to the patient and patient was given instruction on how to change from one bag to another. Patient was given proper instruction on catheter care.    Performed by: CaElberta LeatherwoodCMA  Follow up: 1 month for v&t

## 2022-11-25 ENCOUNTER — Other Ambulatory Visit: Payer: Self-pay | Admitting: Family Medicine

## 2022-11-25 MED ORDER — TRULICITY 0.75 MG/0.5ML ~~LOC~~ SOAJ: 1.5000 mg | SUBCUTANEOUS | 3 refills | Status: DC

## 2022-11-26 ENCOUNTER — Ambulatory Visit: Payer: BLUE CROSS/BLUE SHIELD

## 2022-11-26 DIAGNOSIS — R2689 Other abnormalities of gait and mobility: Secondary | ICD-10-CM

## 2022-11-26 DIAGNOSIS — R262 Difficulty in walking, not elsewhere classified: Secondary | ICD-10-CM

## 2022-11-26 DIAGNOSIS — M6281 Muscle weakness (generalized): Secondary | ICD-10-CM

## 2022-11-26 NOTE — Therapy (Signed)
OUTPATIENT PHYSICAL THERAPY LOWER EXTREMITY TREATMENT    Patient Name: Gabriella Marsh MRN: [id number] DOB:11/23/1966, 56 y.o., female Today's Date: 11/26/2022   PT End of Session - 11/26/22 1522     Visit Number 13    Number of Visits 24    Date for PT Re-Evaluation 12/21/22    Authorization Type [employer] Other    Authorization Time Period 09/28/22-12/21/22    Progress Note Due on Visit 20    PT Start Time 1518    PT Stop Time 1600    PT Time Calculation (min) 42 min    Equipment Utilized During Treatment Gait belt    Activity Tolerance Patient tolerated treatment well;No increased pain    Behavior During Therapy WFL for tasks assessed/performed              Past Medical History:  Diagnosis Date   Arthritis    knees   Blood clotting disorder ([place])    on toe    Degenerative disc disease, lumbar    Diabetes mellitus without complication (HCC)    GERD (gastroesophageal reflux disease)    Headache    daily - AM (has not been able to have SPG blocks lately)   Hyperlipidemia    Hypertension    Neuropathy    feet   Vertigo    3-4x/yr   Past Surgical History:  Procedure Laterality Date   ABDOMINAL HYSTERECTOMY     But still has cervix   AMPUTATION TOE Right 07/24/2021   Procedure: AMPUTATION TOE;  Surgeon: Sharlotte Alamo, DPM;  Location: [facility] ORS;  Service: Podiatry;  Laterality: Right;   CESAREAN SECTION     CHOLECYSTECTOMY     COLONOSCOPY WITH PROPOFOL N/A 02/20/2022   Procedure: COLONOSCOPY WITH PROPOFOL;  Surgeon: [name], MD;  Location: [facility] ENDOSCOPY;  Service: Gastroenterology;  Laterality: N/A;   left arm frature  10/12/2020   OVARIAN CYST SURGERY     PARATHYROIDECTOMY  03/28/2021   Procedure: PARATHYROIDECTOMY AUTOTRANSPLANT;  Surgeon: Fredirick Maudlin, MD;  Location: [facility] ORS;  Service: General;;   right femur surgery Right    SHOULDER SURGERY Left 12/26/014   Dr. Little Ishikawa, [place] N/A 03/28/2021   Procedure: THYROIDECTOMY, total;   Surgeon: Fredirick Maudlin, MD;  Location: [facility] ORS;  Service: General;  Laterality: N/A;  Provider requesting 3 hours /180 minutes for procedure   TONSILLECTOMY AND ADENOIDECTOMY     UTERINE FIBROID SURGERY     Patient Active Problem List   Diagnosis Date Noted   Need for influenza vaccination 09/10/2022   Primary insomnia 09/10/2022   Impaired functional mobility, balance, gait, and endurance 06/09/2022   Type 2 diabetes mellitus with diabetic neuropathy, without long-term current use of insulin ([place]) 04/24/2022   Acquired hypothyroidism 04/24/2022   Hyperlipidemia associated with type 2 diabetes mellitus ([place]) 04/24/2022   Fall 04/24/2022   Dizziness 04/24/2022   Iron deficiency anemia 04/24/2022   Colon cancer screening 01/26/2022   Encounter for screening mammogram for malignant neoplasm of breast 01/26/2022   Chronic sensorimotor polyneuropathy with axonal and demyelinating features 01/12/2022   Osteoarthritis of knees (Bilateral) 01/12/2022   Tricompartment osteoarthritis of knees (Bilateral) 01/12/2022    Grade 1 Retrolisthesis (39m) of L5 over S1 01/12/2022   History of proximal humerus fracture (Left) 01/12/2022   Vitamin D deficiency 01/12/2022   Elevated hemoglobin A1c 01/12/2022   Hypochloremia 01/12/2022   Hyperglycemia 01/12/2022   Chronic peripheral neuropathic pain 01/12/2022   Type 2 diabetes mellitus with peripheral  neuropathy ([place]) 01/12/2022   Glands swollen 01/09/2022   Acute cough 01/09/2022   Acute non-recurrent pansinusitis 01/09/2022   Rhinorrhea 01/09/2022   Left acute otitis media 01/09/2022   Pharmacologic therapy 11/12/2021   Disorder of skeletal system 11/12/2021   Problems influencing health status 11/12/2021   Chronic hand pain (1ry area of Pain) (Bilateral) (L>R) 11/12/2021   Chronic feet pain (3ry area of Pain) (Bilateral) (L>R) 11/12/2021   Chronic shoulder pain (4th area of Pain) (Left) 11/12/2021   Chronic knee pain (5th area of Pain)  (Bilateral) (L>R) 11/12/2021   Lumbar facet syndrome (Bilateral) 11/12/2021   Muscle spasm 10/22/2021   Type 2 diabetes mellitus with microalbuminuria, without long-term current use of insulin ([place]) 10/22/2021   Diabetic foot infection ([place]) 07/22/2021   Depression with anxiety 07/22/2021   Sepsis ([place]) 07/22/2021   COVID-19 virus infection 07/22/2021   Hypocalcemia 07/17/2021   Chronic pain syndrome 07/17/2021   S/P total thyroidectomy 03/28/2021   Thyroid nodule    Blue toe syndrome of left lower extremity ([place]) 10/01/2020   Schamberg's purpura 10/09/2019   Leg swelling 10/09/2019   Benign positional vertigo 02/03/2016   Common migraine with intractable migraine 06/18/2015   Anxiety 05/04/2015   Adaptation reaction 05/03/2015   DDD (degenerative disc disease), lumbosacral 05/03/2015   Hypercholesteremia 05/03/2015   Insomnia due to medical condition 05/03/2015   Adiposity 05/03/2015   Disorder of peripheral nervous system 05/03/2015   Snores 05/03/2015   Diabetic peripheral neuropathy ([place]) 03/06/2015   Essential hypertension 03/06/2015   Elevated liver function tests 03/06/2015   Obesity 03/06/2015   GERD (gastroesophageal reflux disease) 03/06/2015   Multinodular goiter 03/06/2015   RLS (restless legs syndrome) 03/06/2015   Chronic low back pain (2ry area of Pain) (Bilateral) (L>R) w/o sciatica 03/06/2015   Leg cramps 03/06/2015    PCP: [name], FNP  REFERRING PROVIDER: [name], FNP  REFERRING DIAG: [phone] (ICD-10-CM) - Fall, subsequent encounter  THERAPY DIAG:  Other abnormalities of gait and mobility  Difficulty in walking, not elsewhere classified  Muscle weakness (generalized)  Rationale for Evaluation and Treatment Rehabilitation  ONSET DATE: 06/18/22  SUBJECTIVE:   SUBJECTIVE STATEMENT:  Pt reports she is no longer feeling sick.  She is feeling better and has been able to get her legs a little stronger as well.  Pt notes that she does  not think she will get her braces before the new year and have to pay out of pocket.  PERTINENT HISTORY: Pt reports her fall initially occurred in the evening when her husband was not home. Pt got up and had her Rolator and she walked toward her hallway. When she turned around to turn the light on she went completely down on her R leg and broke her femur distally and twisted her knee. When EMT arrived she had to straighten her LE to immobilize it. Pt went to [facility] hospital for the trauma.   Upon arrival she was evaluated and had to get MRI and imaging. Progressed to 06/19/22. Pt had surgery that night. Pt had to have plate and screws in her knee and rod in her femur. Pt was in hospital for 11 days then progressed to [facility] for [facility] days. [facility].   Upon coming home with [facility] pt came home with catheter. Pt came home and was relieved. Pt husband helps with everything doing "everything" for her except she helps with cooking.  Pt considered home health physical therapy but the cost of care would be too high ( $125 per visit) and this.   Prior to injury pt was ambulating with rolator. Pt used bedside commode at that time. Pt reports 6 falls during this year. Pt reports 2-3 falls with rolator and 2-3 falls walking between bathroom and bedroom and 1 fall in bedroom.   Pt had another fall at [place] where she broke her left humerus about 2 years ago.   PAIN:  Are you having pain? No  PRECAUTIONS: Other: WBAT  WEIGHT BEARING RESTRICTIONS     Gardenia Phlegm, MD MPH - 09/03/2022 3:15 PM EDT   Formatting of this note might be different from the original.  At this time, you can continue weightbearing as tolerated to right lower extremity. Please continue working on range of motion and strengthening. We have given you prescription for physical therapy.   FALLS:  Has patient fallen in last 6 months? Yes. 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 Pt does not know if she will be able to sit in one position for that long. Not sure if it will be feasible to get back to work.   PLOF: Requires assistive device for independence, Needs assistance with ADLs, Needs assistance with homemaking, and Needs assistance with gait  PATIENT GOALS Pt would like to get legs moving a little bit more. Pt wants to be able to get to bathroom on her own.  Get upper body strength improved.      TODAY'S TREATMENT:  TherEx:  Maneuvered WC with foot rests removed to // bars:  Performed STS with min-modA +2, blocking the L knee for 3 bouts; 1st bout: 1.5 minutes 2nd bout: 2.5 minutes 3rd bout: 4 minutes  LAQ 3 x 10,  3# on R and AAROM on L with lifting the thigh for foot clearance, no resistance  Seated march 3x10 with 3# AW.   Seated hamstring curls, 2x15 with GTB resistance.      PATIENT EDUCATION:  Education details: HEP, POC Person educated: Patient Education method: Explanation Education comprehension: verbalized understanding   HOME EXERCISE PROGRAM: Access Code: [email or web address] URL: [email or web address] Date: 09/28/2022 Prepared by: Rivka Barbara  Exercises - Seated March  - 1 x daily - 7 x weekly - 2 sets - 10 reps - Seated Long Arc Quad  - 1 x daily - 7 x weekly - 2 sets - 10 reps - Seated Heel Raise  - 1 x daily - 7 x weekly - 2 sets - 20 reps - Seated hip abduction with band  - 1 x daily - 7 x weekly - 2 sets - 10 reps - Seated Hip Adduction Isometrics with Ball  - 1 x daily - 7 x weekly - 2 sets - 10 reps  ASSESSMENT:  CLINICAL IMPRESSION: Pt performed  with great effort throughout the session and performed well with the STS attempts.  Pt required +2 assistance for safety measures, but once in standing, required minA to stay in standing.  Pt able to progress standing time with each subsequent standing attempt.  Pt also utilized less UE support and  was able to tolerate increased LE weight shifts in B LE's.  Pt then performed seated exercises to conclude session with verbal instruction on how to perform at home.   Pt will continue to benefit from skilled therapy to address remaining deficits in order to improve overall QoL and return to PLOF.      OBJECTIVE IMPAIRMENTS Abnormal gait, decreased activity tolerance, decreased balance, decreased endurance, decreased mobility, difficulty walking, decreased ROM, decreased strength, impaired perceived functional ability, and impaired sensation.   ACTIVITY LIMITATIONS carrying, lifting, bending, standing, squatting, stairs, transfers, bed mobility, bathing, toileting, dressing, self feeding, hygiene/grooming, and locomotion level  PARTICIPATION LIMITATIONS: meal prep, cleaning, laundry, driving, shopping, community activity, occupation, and yard work  PERSONAL FACTORS 3+ comorbidities: Polyneuropathy, hypertension, hyperlipidemia, diabetes mellitus, arthritis  are also affecting patient's functional outcome.   REHAB POTENTIAL: Fair secondary to comorbidities above particularly polyneuropathy affecting her lower extremity strength and muscle activation  CLINICAL DECISION MAKING: Evolving/moderate complexity  EVALUATION COMPLEXITY: Moderate   GOALS: Goals reviewed with patient? Yes  SHORT TERM GOALS: Target date: 10/26/2022     Patient will be independent in home exercise program to improve strength/mobility for better functional independence with ADLs. Baseline: No HEP currently 11/15: Completing Hep regularly.  Goal status: Met    LONG TERM GOALS: Target date: 01/27/2023   Patient will be  modified independent for slide board transfers in order to improve her ability to transfer to and from wheelchair as well as bedside commode for improvement in ADLs. Baseline: Patient currently requires assistance with slide board transfers is unable to utilize bedside commode and requires use of catheter and bedpan 11/15: Pt completing slide board transfers and only requires assistance with placing slide board and maneuvering WC Goal status: IN PROGRESS  2.  Patient will perform sit to and from stand with min assist from physical therapist in order to indicate improved lower extremity functional strength and improved ability to perform functional transfers and functional leg activities within her home. Baseline: Patient unable to stand independently at this time due to lower extremity weakness 11/04/2022: Patient is able to perform anterior lean and pull on parallel bars and activate gluteal muscles was unable to lift weight from wheelchair Goal status: INITIAL  3.  Patient will improve focus on therapeutic outcome score to 35 or greater in order to indicate improved perceived functional ability Baseline: 19 11/15: 40 Goal status: MET  4.  Patient will perform stand pivot transfer from wheelchair to mat table with moderate assistance or less in order to improve her ability to perform transfers within the home to improve her overall functional capacity. Baseline: Unable to complete 11/04/2022: Patient is able to perform anterior lean and pull on parallel bars and activate gluteal muscles was unable to lift weight from wheelchair Goal status: INITIAL  4.  Pt will improve LEFS by 10 points or more in order to indicate improved LE function and mobility.  Baseline: 9 Goal status: INITIAL    PLAN: PT FREQUENCY: 2x/week  PT DURATION: 12 weeks  PLANNED INTERVENTIONS: Therapeutic exercises, Therapeutic activity, Neuromuscular re-education, Balance training, Gait training, Patient/Family education,  Self Care, Joint mobilization, Stair training, DME instructions, and Electrical stimulation  PLAN FOR NEXT SESSION:    Nustep, standing in // bars as able, LE strength    Gwenlyn Saran, PT, DPT Physical Therapist- [facility]  11/26/22, 3:23 PM

## 2022-11-30 ENCOUNTER — Encounter: Payer: Self-pay | Admitting: Physical Therapy

## 2022-11-30 ENCOUNTER — Ambulatory Visit: Payer: BLUE CROSS/BLUE SHIELD | Admitting: Physical Therapy

## 2022-11-30 ENCOUNTER — Other Ambulatory Visit: Payer: Self-pay | Admitting: Family Medicine

## 2022-11-30 DIAGNOSIS — R2689 Other abnormalities of gait and mobility: Secondary | ICD-10-CM

## 2022-11-30 DIAGNOSIS — M6281 Muscle weakness (generalized): Secondary | ICD-10-CM

## 2022-11-30 DIAGNOSIS — R262 Difficulty in walking, not elsewhere classified: Secondary | ICD-10-CM

## 2022-11-30 MED ORDER — TRULICITY 3 MG/0.5ML ~~LOC~~ SOAJ: 3.0000 mg | SUBCUTANEOUS | 0 refills | Status: DC

## 2022-11-30 NOTE — Therapy (Signed)
OUTPATIENT PHYSICAL THERAPY LOWER EXTREMITY TREATMENT    Patient Name: Gabriella Marsh MRN: [id number] DOB:11/25/1966, 56 y.o., female Today's Date: 11/30/2022   PT End of Session - 11/30/22 1612     Visit Number 14    Number of Visits 24    Date for PT Re-Evaluation 12/21/22    Authorization Type [employer] Other    Authorization Time Period 09/28/22-12/21/22    Progress Note Due on Visit 20    PT Start Time 1601    PT Stop Time 1645    PT Time Calculation (min) 44 min    Equipment Utilized During Treatment Gait belt    Activity Tolerance Patient tolerated treatment well;No increased pain    Behavior During Therapy WFL for tasks assessed/performed                    Past Medical History:  Diagnosis Date   Arthritis    knees   Blood clotting disorder ([place])    on toe    Degenerative disc disease, lumbar    Diabetes mellitus without complication (HCC)    GERD (gastroesophageal reflux disease)    Headache    daily - AM (has not been able to have SPG blocks lately)   Hyperlipidemia    Hypertension    Neuropathy    feet   Vertigo    3-4x/yr   Past Surgical History:  Procedure Laterality Date   ABDOMINAL HYSTERECTOMY     But still has cervix   AMPUTATION TOE Right 07/24/2021   Procedure: AMPUTATION TOE;  Surgeon: Sharlotte Alamo, DPM;  Location: [facility] ORS;  Service: Podiatry;  Laterality: Right;   CESAREAN SECTION     CHOLECYSTECTOMY     COLONOSCOPY WITH PROPOFOL N/A 02/20/2022   Procedure: COLONOSCOPY WITH PROPOFOL;  Surgeon: [name], MD;  Location: [facility];  Service: Gastroenterology;  Laterality: N/A;   left arm frature  10/12/2020   OVARIAN CYST SURGERY     PARATHYROIDECTOMY  03/28/2021   Procedure: PARATHYROIDECTOMY AUTOTRANSPLANT;  Surgeon: Fredirick Maudlin, MD;  Location: [facility] ORS;  Service: General;;   right femur surgery Right    SHOULDER SURGERY Left 12/26/014   Dr. Little Ishikawa, [place] N/A 03/28/2021   Procedure:  THYROIDECTOMY, total;  Surgeon: Fredirick Maudlin, MD;  Location: [facility] ORS;  Service: General;  Laterality: N/A;  Provider requesting 3 hours /180 minutes for procedure   TONSILLECTOMY AND ADENOIDECTOMY     UTERINE FIBROID SURGERY     Patient Active Problem List   Diagnosis Date Noted   Need for influenza vaccination 09/10/2022   Primary insomnia 09/10/2022   Impaired functional mobility, balance, gait, and endurance 06/09/2022   Type 2 diabetes mellitus with diabetic neuropathy, without long-term current use of insulin ([place]) 04/24/2022   Acquired hypothyroidism 04/24/2022   Hyperlipidemia associated with type 2 diabetes mellitus ([place]) 04/24/2022   Fall 04/24/2022   Dizziness 04/24/2022   Iron deficiency anemia 04/24/2022   Colon cancer screening 01/26/2022   Encounter for screening mammogram for malignant neoplasm of breast 01/26/2022   Chronic sensorimotor polyneuropathy with axonal and demyelinating features 01/12/2022   Osteoarthritis of knees (Bilateral) 01/12/2022   Tricompartment osteoarthritis of knees (Bilateral) 01/12/2022    Grade 1 Retrolisthesis (43m) of L5 over S1 01/12/2022   History of proximal humerus fracture (Left) 01/12/2022   Vitamin D deficiency 01/12/2022   Elevated hemoglobin A1c 01/12/2022   Hypochloremia 01/12/2022   Hyperglycemia 01/12/2022   Chronic peripheral neuropathic pain 01/12/2022  Type 2 diabetes mellitus with peripheral neuropathy (HCC) 01/12/2022   Glands swollen 01/09/2022   Acute cough 01/09/2022   Acute non-recurrent pansinusitis 01/09/2022   Rhinorrhea 01/09/2022   Left acute otitis media 01/09/2022   Pharmacologic therapy 11/12/2021   Disorder of skeletal system 11/12/2021   Problems influencing health status 11/12/2021   Chronic hand pain (1ry area of Pain) (Bilateral) (L>R) 11/12/2021   Chronic feet pain (3ry area of Pain) (Bilateral) (L>R) 11/12/2021   Chronic shoulder pain (4th area of Pain) (Left) 11/12/2021   Chronic knee pain  (5th area of Pain) (Bilateral) (L>R) 11/12/2021   Lumbar facet syndrome (Bilateral) 11/12/2021   Muscle spasm 10/22/2021   Type 2 diabetes mellitus with microalbuminuria, without long-term current use of insulin ([place]) 10/22/2021   Diabetic foot infection ([place]) 07/22/2021   Depression with anxiety 07/22/2021   Sepsis ([place]) 07/22/2021   COVID-19 virus infection 07/22/2021   Hypocalcemia 07/17/2021   Chronic pain syndrome 07/17/2021   S/P total thyroidectomy 03/28/2021   Thyroid nodule    Blue toe syndrome of left lower extremity ([place]) 10/01/2020   Schamberg's purpura 10/09/2019   Leg swelling 10/09/2019   Benign positional vertigo 02/03/2016   Common migraine with intractable migraine 06/18/2015   Anxiety 05/04/2015   Adaptation reaction 05/03/2015   DDD (degenerative disc disease), lumbosacral 05/03/2015   Hypercholesteremia 05/03/2015   Insomnia due to medical condition 05/03/2015   Adiposity 05/03/2015   Disorder of peripheral nervous system 05/03/2015   Snores 05/03/2015   Diabetic peripheral neuropathy ([place]) 03/06/2015   Essential hypertension 03/06/2015   Elevated liver function tests 03/06/2015   Obesity 03/06/2015   GERD (gastroesophageal reflux disease) 03/06/2015   Multinodular goiter 03/06/2015   RLS (restless legs syndrome) 03/06/2015   Chronic low back pain (2ry area of Pain) (Bilateral) (L>R) w/o sciatica 03/06/2015   Leg cramps 03/06/2015    PCP: [name], FNP  REFERRING PROVIDER: [name], FNP  REFERRING DIAG: [phone] (ICD-10-CM) - Fall, subsequent encounter  THERAPY DIAG:  Other abnormalities of gait and mobility  Difficulty in walking, not elsewhere classified  Muscle weakness (generalized)  Rationale for Evaluation and Treatment Rehabilitation  ONSET DATE: 06/18/22  SUBJECTIVE:   SUBJECTIVE STATEMENT: Pt reports one near fall when using slide board over the weekend but her husband caught her to prevent fall. This occurred when  transferring from [facility] to lift chair.   PERTINENT HISTORY: Pt reports her fall initially occurred in the evening when her husband was not home. Pt got up and had her Rolator and she walked toward her hallway. When she turned around to turn the light on she went completely down on her R leg and broke her femur distally and twisted her knee. When EMT arrived she had to straighten her LE to immobilize it. Pt went to [facility] for the trauma.   Upon arrival she was evaluated and had to get MRI and imaging. Progressed to 06/19/22. Pt had surgery that night. Pt had to have plate and screws in her knee and rod in her femur. Pt was in hospital for 11 days then progressed to [facility] days. [facility].   Upon coming home with [facility] pt came home with catheter. Pt came home and was relieved. Pt husband helps with everything doing "everything" for her except she helps with cooking.   Pt considered home health physical therapy but the cost of care would be too high ( $  125 per visit) and this.   Prior to injury pt was ambulating with rolator. Pt used bedside commode at that time. Pt reports 6 falls during this year. Pt reports 2-3 falls with rolator and 2-3 falls walking between bathroom and bedroom and 1 fall in bedroom.   Pt had another fall at [place] where she broke her left humerus about 2 years ago.   PAIN:  Are you having pain? No  PRECAUTIONS: Other: WBAT  WEIGHT BEARING RESTRICTIONS     Gardenia Phlegm, MD MPH - 09/03/2022 3:15 PM EDT   Formatting of this note might be different from the original.  At this time, you can continue weightbearing as tolerated to right lower extremity. Please continue working on range of motion and strengthening. We have given you prescription for physical therapy.   FALLS:  Has patient fallen in last 6 months? Yes. 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 Pt does not know if she will be able to sit in one position for that long. Not sure if it will be feasible to get back to work.   PLOF: Requires assistive device for independence, Needs assistance with ADLs, Needs assistance with homemaking, and Needs assistance with gait  PATIENT GOALS Pt would like to get legs moving a little bit more. Pt wants to be able to get to bathroom on her own.  Get upper body strength improved.      TODAY'S TREATMENT: 11/30/22  TE Maneuvered WC with foot rests removed to // bars:  Performed STS with min-modA +2, blocking the L knee for 3 bouts; 1st bout: 1.5 minutes 2nd bout: 1.5 minutes 3rd bout: 3 minutes  LAQ 3 x 10,  3# on R and AAROM on L with lifting the thigh for foot clearance, no resistance   Seated hamstring curls, 2x15 with GTB resistance.    Level 1 on Nustep x 6 minutes with L hip abduction stabilizer donned and R LE braced with theraband to keep foot in place.  -transferred with slide board and set up assistance from PT -LE only, 1 min pushing then 30 seconds of rest for 6 minutes.        PATIENT EDUCATION:  Education details: HEP, POC Person educated: Patient Education method: Explanation Education comprehension: verbalized understanding   HOME EXERCISE PROGRAM: Access Code: [email or web address] URL: [email or web address] Date: 09/28/2022 Prepared by: Rivka Barbara  Exercises - Seated March  - 1 x daily - 7 x weekly - 2 sets - 10 reps - Seated Long Arc Quad  - 1 x daily - 7 x weekly - 2 sets - 10 reps - Seated Heel Raise  - 1 x daily - 7 x weekly - 2 sets - 20 reps - Seated hip abduction with band  - 1 x  daily - 7 x weekly - 2 sets - 10 reps - Seated Hip Adduction Isometrics with Ball  - 1 x daily - 7 x weekly - 2 sets - 10 reps  ASSESSMENT:  CLINICAL IMPRESSION: Pt presents with great motivation for completion of PT activities. Pt progresses with nustep activities this session and is able to use nustep  with only Les showing improved gluteal and quad muscle activation bilaterally. Pt continues to show progress with standing activities showing improved standing balance with UE support and CGA in // bars. Pt still unable to take steps and required mod-max A x 2 for transitioning to standing position.  Pt will continue to benefit from skilled physical therapy intervention to address impairments, improve QOL, and attain therapy goals.     OBJECTIVE IMPAIRMENTS Abnormal gait, decreased activity tolerance, decreased balance, decreased endurance, decreased mobility, difficulty walking, decreased ROM, decreased strength, impaired perceived functional ability, and impaired sensation.   ACTIVITY LIMITATIONS carrying, lifting, bending, standing, squatting, stairs, transfers, bed mobility, bathing, toileting, dressing, self feeding, hygiene/grooming, and locomotion level  PARTICIPATION LIMITATIONS: meal prep, cleaning, laundry, driving, shopping, community activity, occupation, and yard work  PERSONAL FACTORS 3+ comorbidities: Polyneuropathy, hypertension, hyperlipidemia, diabetes mellitus, arthritis  are also affecting patient's functional outcome.   REHAB POTENTIAL: Fair secondary to comorbidities above particularly polyneuropathy affecting her lower extremity strength and muscle activation  CLINICAL DECISION MAKING: Evolving/moderate complexity  EVALUATION COMPLEXITY: Moderate   GOALS: Goals reviewed with patient? Yes  SHORT TERM GOALS: Target date: 10/26/2022     Patient will be independent in home exercise program to improve strength/mobility for better functional independence with  ADLs. Baseline: No HEP currently 11/15: Completing Hep regularly.  Goal status: Met    LONG TERM GOALS: Target date: 01/27/2023   Patient will be modified independent for slide board transfers in order to improve her ability to transfer to and from wheelchair as well as bedside commode for improvement in ADLs. Baseline: Patient currently requires assistance with slide board transfers is unable to utilize bedside commode and requires use of catheter and bedpan 11/15: Pt completing slide board transfers and only requires assistance with placing slide board and maneuvering WC Goal status: IN PROGRESS  2.  Patient will perform sit to and from stand with min assist from physical therapist in order to indicate improved lower extremity functional strength and improved ability to perform functional transfers and functional leg activities within her home. Baseline: Patient unable to stand independently at this time due to lower extremity weakness 11/04/2022: Patient is able to perform anterior lean and pull on parallel bars and activate gluteal muscles was unable to lift weight from wheelchair Goal status: INITIAL  3.  Patient will improve focus on therapeutic outcome score to 35 or greater in order to indicate improved perceived functional ability Baseline: 19 11/15: 40 Goal status: MET  4.  Patient will perform stand pivot transfer from wheelchair to mat table with moderate assistance or less in order to improve her ability to perform transfers within the home to improve her overall functional capacity. Baseline: Unable to complete 11/04/2022: Patient is able to perform anterior lean and pull on parallel bars and activate gluteal muscles was unable to lift weight from wheelchair Goal status: INITIAL  4.  Pt will improve LEFS by 10 points or more in order to indicate improved LE function and mobility.  Baseline: 9 Goal status: INITIAL    PLAN: PT FREQUENCY: 2x/week  PT DURATION: 12  weeks  PLANNED INTERVENTIONS: Therapeutic exercises, Therapeutic activity, Neuromuscular re-education, Balance training, Gait training, Patient/Family education, Self Care, Joint mobilization, Stair training, DME instructions, and Electrical stimulation  PLAN FOR NEXT SESSION: Nustep, standing in // bars as able, LE strength    Particia Lather PT  11/30/2022, 4:56 PM

## 2022-12-02 ENCOUNTER — Ambulatory Visit: Payer: BLUE CROSS/BLUE SHIELD | Admitting: Physical Therapy

## 2022-12-02 ENCOUNTER — Encounter: Payer: Self-pay | Admitting: Physical Therapy

## 2022-12-02 ENCOUNTER — Other Ambulatory Visit: Payer: Self-pay | Admitting: Family Medicine

## 2022-12-02 DIAGNOSIS — R2689 Other abnormalities of gait and mobility: Secondary | ICD-10-CM | POA: Diagnosis not present

## 2022-12-02 DIAGNOSIS — M6281 Muscle weakness (generalized): Secondary | ICD-10-CM

## 2022-12-02 DIAGNOSIS — R262 Difficulty in walking, not elsewhere classified: Secondary | ICD-10-CM

## 2022-12-02 NOTE — Therapy (Signed)
OUTPATIENT PHYSICAL THERAPY LOWER EXTREMITY TREATMENT    Patient Name: Gabriella Marsh MRN: [id number] DOB:April 07, 1966, 56 y.o., female Today's Date: 12/02/2022   PT End of Session - 12/02/22 1159     Visit Number 15    Number of Visits 24    Date for PT Re-Evaluation 12/21/22    Authorization Type BCBS Other    Authorization Time Period 09/28/22-12/21/22    Progress Note Due on Visit 20    PT Start Time 1137    PT Stop Time 1217    PT Time Calculation (min) 40 min    Equipment Utilized During Treatment Gait belt    Activity Tolerance Patient tolerated treatment well;No increased pain    Behavior During Therapy WFL for tasks assessed/performed                    Past Medical History:  Diagnosis Date   Arthritis    knees   Blood clotting disorder ([place])    on toe    Degenerative disc disease, lumbar    Diabetes mellitus without complication (HCC)    GERD (gastroesophageal reflux disease)    Headache    daily - AM (has not been able to have SPG blocks lately)   Hyperlipidemia    Hypertension    Neuropathy    feet   Vertigo    3-4x/yr   Past Surgical History:  Procedure Laterality Date   ABDOMINAL HYSTERECTOMY     But still has cervix   AMPUTATION TOE Right 07/24/2021   Procedure: AMPUTATION TOE;  Surgeon: Sharlotte Alamo, DPM;  Location: [facility] ORS;  Service: Podiatry;  Laterality: Right;   CESAREAN SECTION     CHOLECYSTECTOMY     COLONOSCOPY WITH PROPOFOL N/A 02/20/2022   Procedure: COLONOSCOPY WITH PROPOFOL;  Surgeon: [name], MD;  Location: [facility] ENDOSCOPY;  Service: Gastroenterology;  Laterality: N/A;   left arm frature  10/12/2020   OVARIAN CYST SURGERY     PARATHYROIDECTOMY  03/28/2021   Procedure: PARATHYROIDECTOMY AUTOTRANSPLANT;  Surgeon: Fredirick Maudlin, MD;  Location: [facility] ORS;  Service: General;;   right femur surgery Right    SHOULDER SURGERY Left 12/26/014   Dr. Little Ishikawa, [place] N/A 03/28/2021   Procedure:  THYROIDECTOMY, total;  Surgeon: Fredirick Maudlin, MD;  Location: [facility] ORS;  Service: General;  Laterality: N/A;  Provider requesting 3 hours /180 minutes for procedure   TONSILLECTOMY AND ADENOIDECTOMY     UTERINE FIBROID SURGERY     Patient Active Problem List   Diagnosis Date Noted   Need for influenza vaccination 09/10/2022   Primary insomnia 09/10/2022   Impaired functional mobility, balance, gait, and endurance 06/09/2022   Type 2 diabetes mellitus with diabetic neuropathy, without long-term current use of insulin ([place]) 04/24/2022   Acquired hypothyroidism 04/24/2022   Hyperlipidemia associated with type 2 diabetes mellitus ([place]) 04/24/2022   Fall 04/24/2022   Dizziness 04/24/2022   Iron deficiency anemia 04/24/2022   Colon cancer screening 01/26/2022   Encounter for screening mammogram for malignant neoplasm of breast 01/26/2022   Chronic sensorimotor polyneuropathy with axonal and demyelinating features 01/12/2022   Osteoarthritis of knees (Bilateral) 01/12/2022   Tricompartment osteoarthritis of knees (Bilateral) 01/12/2022    Grade 1 Retrolisthesis (68m) of L5 over S1 01/12/2022   History of proximal humerus fracture (Left) 01/12/2022   Vitamin D deficiency 01/12/2022   Elevated hemoglobin A1c 01/12/2022   Hypochloremia 01/12/2022   Hyperglycemia 01/12/2022   Chronic peripheral neuropathic pain 01/12/2022  Type 2 diabetes mellitus with peripheral neuropathy (HCC) 01/12/2022   Glands swollen 01/09/2022   Acute cough 01/09/2022   Acute non-recurrent pansinusitis 01/09/2022   Rhinorrhea 01/09/2022   Left acute otitis media 01/09/2022   Pharmacologic therapy 11/12/2021   Disorder of skeletal system 11/12/2021   Problems influencing health status 11/12/2021   Chronic hand pain (1ry area of Pain) (Bilateral) (L>R) 11/12/2021   Chronic feet pain (3ry area of Pain) (Bilateral) (L>R) 11/12/2021   Chronic shoulder pain (4th area of Pain) (Left) 11/12/2021   Chronic knee pain  (5th area of Pain) (Bilateral) (L>R) 11/12/2021   Lumbar facet syndrome (Bilateral) 11/12/2021   Muscle spasm 10/22/2021   Type 2 diabetes mellitus with microalbuminuria, without long-term current use of insulin ([place]) 10/22/2021   Diabetic foot infection ([place]) 07/22/2021   Depression with anxiety 07/22/2021   Sepsis ([place]) 07/22/2021   COVID-19 virus infection 07/22/2021   Hypocalcemia 07/17/2021   Chronic pain syndrome 07/17/2021   S/P total thyroidectomy 03/28/2021   Thyroid nodule    Blue toe syndrome of left lower extremity ([place]) 10/01/2020   Schamberg's purpura 10/09/2019   Leg swelling 10/09/2019   Benign positional vertigo 02/03/2016   Common migraine with intractable migraine 06/18/2015   Anxiety 05/04/2015   Adaptation reaction 05/03/2015   DDD (degenerative disc disease), lumbosacral 05/03/2015   Hypercholesteremia 05/03/2015   Insomnia due to medical condition 05/03/2015   Adiposity 05/03/2015   Disorder of peripheral nervous system 05/03/2015   Snores 05/03/2015   Diabetic peripheral neuropathy ([place]) 03/06/2015   Essential hypertension 03/06/2015   Elevated liver function tests 03/06/2015   Obesity 03/06/2015   GERD (gastroesophageal reflux disease) 03/06/2015   Multinodular goiter 03/06/2015   RLS (restless legs syndrome) 03/06/2015   Chronic low back pain (2ry area of Pain) (Bilateral) (L>R) w/o sciatica 03/06/2015   Leg cramps 03/06/2015    PCP: [name], FNP  REFERRING PROVIDER: [name], FNP  REFERRING DIAG: [phone] (ICD-10-CM) - Fall, subsequent encounter  THERAPY DIAG:  Other abnormalities of gait and mobility  Difficulty in walking, not elsewhere classified  Muscle weakness (generalized)  Rationale for Evaluation and Treatment Rehabilitation  ONSET DATE: 06/18/22  SUBJECTIVE:   SUBJECTIVE STATEMENT: Pt reports no significant changes since last session where she pushed harder as her husband was present for the treatments. Reports  her legs were very sore/ painful after last session but she has recovered some today.   PERTINENT HISTORY: Pt reports her fall initially occurred in the evening when her husband was not home. Pt got up and had her Rolator and she walked toward her hallway. When she turned around to turn the light on she went completely down on her R leg and broke her femur distally and twisted her knee. When EMT arrived she had to straighten her LE to immobilize it. Pt went to [facility] hospital for the trauma.   Upon arrival she was evaluated and had to get MRI and imaging. Progressed to 06/19/22. Pt had surgery that night. Pt had to have plate and screws in her knee and rod in her femur. Pt was in hospital for 11 days then progressed to [facility] for [facility] days. [facility].   Upon coming home with [facility] pt came home with catheter. Pt came home and was relieved. Pt husband helps with everything doing "everything" for her except she helps with cooking.   Pt considered home health physical therapy but the  cost of care would be too high ( $125 per visit) and this.   Prior to injury pt was ambulating with rolator. Pt used bedside commode at that time. Pt reports 6 falls during this year. Pt reports 2-3 falls with rolator and 2-3 falls walking between bathroom and bedroom and 1 fall in bedroom.   Pt had another fall at [facility] where she broke her left humerus about 2 years ago.   PAIN:  Are you having pain? No  PRECAUTIONS: Other: WBAT  WEIGHT BEARING RESTRICTIONS     Gardenia Phlegm, MD MPH - 09/03/2022 3:15 PM EDT   Formatting of this note might be different from the original.  At this time, you can continue weightbearing as tolerated to right lower extremity. Please continue working on range of motion and strengthening. We have given you prescription for physical therapy.   FALLS:  Has patient fallen in last 6 months? Yes. 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 Pt does not know if she will be able to sit in one position for that long. Not sure if it will be feasible to get back to work.   PLOF: Requires assistive device for independence, Needs assistance with ADLs, Needs assistance with homemaking, and Needs assistance with gait  PATIENT GOALS Pt would like to get legs moving a little bit more. Pt wants to be able to get to bathroom on her own.  Get upper body strength improved.      TODAY'S TREATMENT: 12/02/22  TE  Level 1 on Nustep x 6 minutes with L hip abduction stabilizer donned and R LE braced with theraband to keep foot in place.  -transferred with slide board and set up assistance from PT -LE only, 4 min LE and UE, 1 min LE only, 1 min UE/ LE    Rocker board rocks in seated x 20 each directions, cues for glute sets to rock ankle into DF   LAQ 3 x 15,  3# on R and AAROM on L with lifting the thigh for foot clearance, no resistance - Adductor squeeze 2 x 15 reps with ball   Abduction hip with GTB 2 x 15 reps   Seated hamstring curls, 2x15 with GTB resistance.           PATIENT EDUCATION:  Education details: HEP, POC Person educated: Patient Education method: Explanation Education comprehension: verbalized understanding   HOME EXERCISE PROGRAM: Access Code: [email or web address] URL: [email or web address] Date: 09/28/2022 Prepared by: Rivka Barbara  Exercises - Seated March  - 1 x daily - 7 x weekly - 2 sets - 10 reps - Seated Long Arc Quad  - 1 x daily - 7 x weekly - 2 sets - 10 reps - Seated Heel Raise  - 1 x daily - 7 x weekly - 2  sets - 20 reps - Seated hip abduction with band  - 1 x daily - 7 x weekly - 2 sets - 10 reps - Seated Hip Adduction Isometrics with Ball  - 1 x daily - 7 x weekly - 2 sets - 10 reps  ASSESSMENT:  CLINICAL IMPRESSION: Pt presents with great motivation  for completion of PT activities. Pt continues with lower extremity strengthening interventions in order to improve her lower extremity muscle strength.  We continues to progress with lower extremity strengthening exercises evidenced by increased resistance and increased repetitions with various activities.  Patient still unable to perform full long arc quad on the left lower extremity but her active motion is improving on the side.Pt will continue to benefit from skilled physical therapy intervention to address impairments, improve QOL, and attain therapy goals.     OBJECTIVE IMPAIRMENTS Abnormal gait, decreased activity tolerance, decreased balance, decreased endurance, decreased mobility, difficulty walking, decreased ROM, decreased strength, impaired perceived functional ability, and impaired sensation.   ACTIVITY LIMITATIONS carrying, lifting, bending, standing, squatting, stairs, transfers, bed mobility, bathing, toileting, dressing, self feeding, hygiene/grooming, and locomotion level  PARTICIPATION LIMITATIONS: meal prep, cleaning, laundry, driving, shopping, community activity, occupation, and yard work  PERSONAL FACTORS 3+ comorbidities: Polyneuropathy, hypertension, hyperlipidemia, diabetes mellitus, arthritis  are also affecting patient's functional outcome.   REHAB POTENTIAL: Fair secondary to comorbidities above particularly polyneuropathy affecting her lower extremity strength and muscle activation  CLINICAL DECISION MAKING: Evolving/moderate complexity  EVALUATION COMPLEXITY: Moderate   GOALS: Goals reviewed with patient? Yes  SHORT TERM GOALS: Target date: 10/26/2022     Patient will be independent in home exercise program  to improve strength/mobility for better functional independence with ADLs. Baseline: No HEP currently 11/15: Completing Hep regularly.  Goal status: Met    LONG TERM GOALS: Target date: 01/27/2023   Patient will be modified independent for slide board transfers in order to improve her ability to transfer to and from wheelchair as well as bedside commode for improvement in ADLs. Baseline: Patient currently requires assistance with slide board transfers is unable to utilize bedside commode and requires use of catheter and bedpan 11/15: Pt completing slide board transfers and only requires assistance with placing slide board and maneuvering WC Goal status: IN PROGRESS  2.  Patient will perform sit to and from stand with min assist from physical therapist in order to indicate improved lower extremity functional strength and improved ability to perform functional transfers and functional leg activities within her home. Baseline: Patient unable to stand independently at this time due to lower extremity weakness 11/04/2022: Patient is able to perform anterior lean and pull on parallel bars and activate gluteal muscles was unable to lift weight from wheelchair Goal status: INITIAL  3.  Patient will improve focus on therapeutic outcome score to 35 or greater in order to indicate improved perceived functional ability Baseline: 19 11/15: 40 Goal status: MET  4.  Patient will perform stand pivot transfer from wheelchair to mat table with moderate assistance or less in order to improve her ability to perform transfers within the home to improve her overall functional capacity. Baseline: Unable to complete 11/04/2022: Patient is able to perform anterior lean and pull on parallel bars and activate gluteal muscles was unable to lift weight from wheelchair Goal status: INITIAL  4.  Pt will improve LEFS by 10 points or more in order to indicate improved LE function and mobility.  Baseline: 9 Goal status:  INITIAL    PLAN: PT FREQUENCY: 2x/week  PT DURATION: 12 weeks  PLANNED INTERVENTIONS: Therapeutic exercises, Therapeutic activity, Neuromuscular re-education, Balance training, Gait training, Patient/Family education, Self Care, Joint mobilization, Stair training, DME instructions, and Electrical stimulation  PLAN FOR NEXT SESSION: Nustep, standing in // bars as able, LE strength    Particia Lather PT  12/02/2022, 3:03 PM

## 2022-12-03 ENCOUNTER — Other Ambulatory Visit: Payer: Self-pay | Admitting: Family Medicine

## 2022-12-03 DIAGNOSIS — E1140 Type 2 diabetes mellitus with diabetic neuropathy, unspecified: Secondary | ICD-10-CM

## 2022-12-03 NOTE — Telephone Encounter (Signed)
Med d/c'd 07/16/2022. Not on med list. Requested Prescriptions  Pending Prescriptions Disp Refills   glipiZIDE (GLUCOTROL) 10 MG tablet [Pharmacy Med Name: glipiZIDE 10 MG Oral Tablet] 180 tablet 3    Sig: TAKE 1 TABLET BY MOUTH TWICE  DAILY BEFORE MEALS     Endocrinology:  Diabetes - Sulfonylureas Failed - 12/03/2022  2:44 AM      Failed - HBA1C is between 0 and 7.9 and within 180 days    Hemoglobin A1C  Date Value Ref Range Status  09/10/2022 8.6 (A) 4.0 - 5.6 % Final   Hgb A1c MFr Bld  Date Value Ref Range Status  04/24/2022 13.1 (H) 4.8 - 5.6 % Final    Comment:             Prediabetes: 5.7 - 6.4          Diabetes: >6.4          Glycemic control for adults with diabetes: <7.0          Passed - Cr in normal range and within 360 days    Creatinine, Ser  Date Value Ref Range Status  05/27/2022 0.84 0.44 - 1.00 mg/dL Final   Creatinine,U  Date Value Ref Range Status  06/05/2015 194.5 mg/dL Final         Passed - Valid encounter within last 6 months    Recent Outpatient Visits           2 months ago Type 2 diabetes mellitus with diabetic neuropathy, without long-term current use of insulin [facility])   [facility] [name], FNP   5 months ago Fall, subsequent encounter   [facility] Tally Joe T, FNP   7 months ago Type 2 diabetes mellitus with diabetic neuropathy, without long-term current use of insulin [facility])   [facility] Tally Joe T, FNP   7 months ago Acute non-recurrent frontal sinusitis   [facility] Thedore Mins, [place], PA-C   10 months ago Type 2 diabetes mellitus with microalbuminuria, without long-term current use of insulin [facility])   [facility] [name], FNP       Future Appointments             In 6 days [name], [place], [place]   In 3 weeks Debroah Loop, PA-C [place]   In 3 weeks Debroah Loop, [place]

## 2022-12-07 ENCOUNTER — Ambulatory Visit: Payer: BLUE CROSS/BLUE SHIELD | Admitting: Physical Therapy

## 2022-12-07 DIAGNOSIS — M6281 Muscle weakness (generalized): Secondary | ICD-10-CM

## 2022-12-07 DIAGNOSIS — R262 Difficulty in walking, not elsewhere classified: Secondary | ICD-10-CM

## 2022-12-07 DIAGNOSIS — R2689 Other abnormalities of gait and mobility: Secondary | ICD-10-CM | POA: Diagnosis not present

## 2022-12-07 NOTE — Therapy (Signed)
OUTPATIENT PHYSICAL THERAPY LOWER EXTREMITY TREATMENT    Patient Name: Gabriella Marsh MRN: [id number] DOB:1966-09-16, 56 y.o., female Today's Date: 12/07/2022   PT End of Session - 12/07/22 1700     Visit Number 16    Number of Visits 24    Date for PT Re-Evaluation 12/21/22    Authorization Type BCBS Other    Authorization Time Period 09/28/22-12/21/22    Progress Note Due on Visit 20    PT Start Time 1601    PT Stop Time 1644    PT Time Calculation (min) 43 min    Equipment Utilized During Treatment Gait belt    Activity Tolerance Patient tolerated treatment well;No increased pain    Behavior During Therapy WFL for tasks assessed/performed                     Past Medical History:  Diagnosis Date   Arthritis    knees   Blood clotting disorder ([place])    on toe    Degenerative disc disease, lumbar    Diabetes mellitus without complication (HCC)    GERD (gastroesophageal reflux disease)    Headache    daily - AM (has not been able to have SPG blocks lately)   Hyperlipidemia    Hypertension    Neuropathy    feet   Vertigo    3-4x/yr   Past Surgical History:  Procedure Laterality Date   ABDOMINAL HYSTERECTOMY     But still has cervix   AMPUTATION TOE Right 07/24/2021   Procedure: AMPUTATION TOE;  Surgeon: Sharlotte Alamo, DPM;  Location: [facility] ORS;  Service: Podiatry;  Laterality: Right;   CESAREAN SECTION     CHOLECYSTECTOMY     COLONOSCOPY WITH PROPOFOL N/A 02/20/2022   Procedure: COLONOSCOPY WITH PROPOFOL;  Surgeon: [name], MD;  Location: [facility] ENDOSCOPY;  Service: Gastroenterology;  Laterality: N/A;   left arm frature  10/12/2020   OVARIAN CYST SURGERY     PARATHYROIDECTOMY  03/28/2021   Procedure: PARATHYROIDECTOMY AUTOTRANSPLANT;  Surgeon: Fredirick Maudlin, MD;  Location: [facility] ORS;  Service: General;;   right femur surgery Right    SHOULDER SURGERY Left 12/26/014   Dr. Little Ishikawa, [place] N/A 03/28/2021   Procedure:  THYROIDECTOMY, total;  Surgeon: Fredirick Maudlin, MD;  Location: [facility] ORS;  Service: General;  Laterality: N/A;  Provider requesting 3 hours /180 minutes for procedure   TONSILLECTOMY AND ADENOIDECTOMY     UTERINE FIBROID SURGERY     Patient Active Problem List   Diagnosis Date Noted   Need for influenza vaccination 09/10/2022   Primary insomnia 09/10/2022   Impaired functional mobility, balance, gait, and endurance 06/09/2022   Type 2 diabetes mellitus with diabetic neuropathy, without long-term current use of insulin ([place]) 04/24/2022   Acquired hypothyroidism 04/24/2022   Hyperlipidemia associated with type 2 diabetes mellitus ([place]) 04/24/2022   Fall 04/24/2022   Dizziness 04/24/2022   Iron deficiency anemia 04/24/2022   Colon cancer screening 01/26/2022   Encounter for screening mammogram for malignant neoplasm of breast 01/26/2022   Chronic sensorimotor polyneuropathy with axonal and demyelinating features 01/12/2022   Osteoarthritis of knees (Bilateral) 01/12/2022   Tricompartment osteoarthritis of knees (Bilateral) 01/12/2022    Grade 1 Retrolisthesis (80m) of L5 over S1 01/12/2022   History of proximal humerus fracture (Left) 01/12/2022   Vitamin D deficiency 01/12/2022   Elevated hemoglobin A1c 01/12/2022   Hypochloremia 01/12/2022   Hyperglycemia 01/12/2022   Chronic peripheral neuropathic pain 01/12/2022  Type 2 diabetes mellitus with peripheral neuropathy (HCC) 01/12/2022   Glands swollen 01/09/2022   Acute cough 01/09/2022   Acute non-recurrent pansinusitis 01/09/2022   Rhinorrhea 01/09/2022   Left acute otitis media 01/09/2022   Pharmacologic therapy 11/12/2021   Disorder of skeletal system 11/12/2021   Problems influencing health status 11/12/2021   Chronic hand pain (1ry area of Pain) (Bilateral) (L>R) 11/12/2021   Chronic feet pain (3ry area of Pain) (Bilateral) (L>R) 11/12/2021   Chronic shoulder pain (4th area of Pain) (Left) 11/12/2021   Chronic knee pain  (5th area of Pain) (Bilateral) (L>R) 11/12/2021   Lumbar facet syndrome (Bilateral) 11/12/2021   Muscle spasm 10/22/2021   Type 2 diabetes mellitus with microalbuminuria, without long-term current use of insulin ([place]) 10/22/2021   Diabetic foot infection ([place]) 07/22/2021   Depression with anxiety 07/22/2021   Sepsis ([place]) 07/22/2021   COVID-19 virus infection 07/22/2021   Hypocalcemia 07/17/2021   Chronic pain syndrome 07/17/2021   S/P total thyroidectomy 03/28/2021   Thyroid nodule    Blue toe syndrome of left lower extremity ([place]) 10/01/2020   Schamberg's purpura 10/09/2019   Leg swelling 10/09/2019   Benign positional vertigo 02/03/2016   Common migraine with intractable migraine 06/18/2015   Anxiety 05/04/2015   Adaptation reaction 05/03/2015   DDD (degenerative disc disease), lumbosacral 05/03/2015   Hypercholesteremia 05/03/2015   Insomnia due to medical condition 05/03/2015   Adiposity 05/03/2015   Disorder of peripheral nervous system 05/03/2015   Snores 05/03/2015   Diabetic peripheral neuropathy ([place]) 03/06/2015   Essential hypertension 03/06/2015   Elevated liver function tests 03/06/2015   Obesity 03/06/2015   GERD (gastroesophageal reflux disease) 03/06/2015   Multinodular goiter 03/06/2015   RLS (restless legs syndrome) 03/06/2015   Chronic low back pain (2ry area of Pain) (Bilateral) (L>R) w/o sciatica 03/06/2015   Leg cramps 03/06/2015    PCP: [name], FNP  REFERRING PROVIDER: [name], FNP  REFERRING DIAG: [phone].XXXD (ICD-10-CM) - Fall, subsequent encounter  THERAPY DIAG:  Other abnormalities of gait and mobility  Difficulty in walking, not elsewhere classified  Muscle weakness (generalized)  Rationale for Evaluation and Treatment Rehabilitation  ONSET DATE: 06/18/22  SUBJECTIVE:   SUBJECTIVE STATEMENT: Pt reports she is feeling tired today but is motivated to continue with strengthening. Still been having pain in her lower legs but  she is working on getting a Diplomatic Services operational officer.   PERTINENT HISTORY: Pt reports her fall initially occurred in the evening when her husband was not home. Pt got up and had her Rolator and she walked toward her hallway. When she turned around to turn the light on she went completely down on her R leg and broke her femur distally and twisted her knee. When EMT arrived she had to straighten her LE to immobilize it. Pt went to [facility] hospital for the trauma.   Upon arrival she was evaluated and had to get MRI and imaging. Progressed to 06/19/22. Pt had surgery that night. Pt had to have plate and screws in her knee and rod in her femur. Pt was in hospital for 11 days then progressed to [facility] for [facility] days. [facility]   Upon coming home with [facility] pt came home with catheter. Pt came home and was relieved. Pt husband helps with everything doing "everything" for her except she helps with cooking.   Pt considered home health physical therapy but the cost of care would be  too high ( $125 per visit) and this.   Prior to injury pt was ambulating with rolator. Pt used bedside commode at that time. Pt reports 6 falls during this year. Pt reports 2-3 falls with rolator and 2-3 falls walking between bathroom and bedroom and 1 fall in bedroom.   Pt had another fall at [place] where she broke her left humerus about 2 years ago.   PAIN:  Are you having pain? No  PRECAUTIONS: Other: WBAT  WEIGHT BEARING RESTRICTIONS     Gardenia Phlegm, MD MPH - 09/03/2022 3:15 PM EDT   Formatting of this note might be different from the original.  At this time, you can continue weightbearing as tolerated to right lower extremity. Please continue working on range of motion and strengthening. We have given you prescription for physical therapy.   FALLS:  Has patient fallen in last 6 months? Yes. 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 Pt does not know if she will be able to sit in one position for that long. Not sure if it will be feasible to get back to work.   PLOF: Requires assistive device for independence, Needs assistance with ADLs, Needs assistance with homemaking, and Needs assistance with gait  PATIENT GOALS Pt would like to get legs moving a little bit more. Pt wants to be able to get to bathroom on her own.  Get upper body strength improved.      TODAY'S TREATMENT: 12/07/22  TE  Level 1 on Nustep x 6 minutes with L hip abduction stabilizer donned and R LE braced with theraband to keep foot in place.  -transferred with slide board and set up assistance from PT -LE only, 4 min LE and UE, 1 min LE only, 1 min UE/ LE    All of the above in seated position    Ball roll outs with glute set at end range for 5 seconds 2 x 15    Rocker board rocks in seated x 20 each directions, cues for glute sets to rock ankle into DF   LAQ x 15,  3# on R and AAROM on L with lifting the thigh for foot clearance, no resistance  Marching in seated   Abduction hip with GTB 2 x 15 reps   Seated hamstring curls, 2x15 with GTB resistance.           PATIENT EDUCATION:  Education details: HEP, POC Person educated: Patient Education method: Explanation Education comprehension: verbalized understanding   HOME EXERCISE PROGRAM: Access Code: [email or web address] URL: [email or web address] Date: 09/28/2022 Prepared by: Rivka Barbara  Exercises - Seated March  - 1 x daily - 7 x weekly - 2 sets - 10 reps - Seated Long Arc Quad  - 1 x daily - 7 x weekly - 2 sets - 10 reps - Seated Heel Raise  - 1  x daily - 7 x weekly - 2 sets - 20 reps - Seated hip abduction with band  - 1 x daily - 7 x weekly - 2 sets - 10 reps - Seated Hip Adduction Isometrics with Ball  - 1 x daily - 7 x  weekly - 2 sets - 10 reps  ASSESSMENT:  CLINICAL IMPRESSION: Pt presents with great motivation for completion of PT activities. Pt continues with lower extremity strengthening interventions in order to improve her lower extremity muscle strength.  Pt had increased difficulty with hip march this session but was toward end of strengthening intervention. Pt did have bad night of sleep which she related to increased weakness this date. Pt will continue to benefit from skilled physical therapy intervention to address impairments, improve QOL, and attain therapy goals.     OBJECTIVE IMPAIRMENTS Abnormal gait, decreased activity tolerance, decreased balance, decreased endurance, decreased mobility, difficulty walking, decreased ROM, decreased strength, impaired perceived functional ability, and impaired sensation.   ACTIVITY LIMITATIONS carrying, lifting, bending, standing, squatting, stairs, transfers, bed mobility, bathing, toileting, dressing, self feeding, hygiene/grooming, and locomotion level  PARTICIPATION LIMITATIONS: meal prep, cleaning, laundry, driving, shopping, community activity, occupation, and yard work  PERSONAL FACTORS 3+ comorbidities: Polyneuropathy, hypertension, hyperlipidemia, diabetes mellitus, arthritis  are also affecting patient's functional outcome.   REHAB POTENTIAL: Fair secondary to comorbidities above particularly polyneuropathy affecting her lower extremity strength and muscle activation  CLINICAL DECISION MAKING: Evolving/moderate complexity  EVALUATION COMPLEXITY: Moderate   GOALS: Goals reviewed with patient? Yes  SHORT TERM GOALS: Target date: 10/26/2022     Patient will be independent in home exercise program to improve strength/mobility for better functional independence with  ADLs. Baseline: No HEP currently 11/15: Completing Hep regularly.  Goal status: Met    LONG TERM GOALS: Target date: 01/27/2023   Patient will be modified independent for slide board transfers in order to improve her ability to transfer to and from wheelchair as well as bedside commode for improvement in ADLs. Baseline: Patient currently requires assistance with slide board transfers is unable to utilize bedside commode and requires use of catheter and bedpan 11/15: Pt completing slide board transfers and only requires assistance with placing slide board and maneuvering WC Goal status: IN PROGRESS  2.  Patient will perform sit to and from stand with min assist from physical therapist in order to indicate improved lower extremity functional strength and improved ability to perform functional transfers and functional leg activities within her home. Baseline: Patient unable to stand independently at this time due to lower extremity weakness 11/04/2022: Patient is able to perform anterior lean and pull on parallel bars and activate gluteal muscles was unable to lift weight from wheelchair Goal status: INITIAL  3.  Patient will improve focus on therapeutic outcome score to 35 or greater in order to indicate improved perceived functional ability Baseline: 19 11/15: 40 Goal status: MET  4.  Patient will perform stand pivot transfer from wheelchair to mat table with moderate assistance or less in order to improve her ability to perform transfers within the home to improve her overall functional capacity. Baseline: Unable to complete 11/04/2022: Patient is able to perform anterior lean and pull on parallel bars and activate gluteal muscles was unable to lift weight from wheelchair Goal status: INITIAL  4.  Pt will improve LEFS by 10 points or more in order to indicate improved LE function and mobility.  Baseline: 9 Goal status: INITIAL    PLAN: PT FREQUENCY: 2x/week  PT DURATION: 12  weeks  PLANNED INTERVENTIONS: Therapeutic exercises, Therapeutic activity, Neuromuscular re-education, Balance training, Gait training, Patient/Family education, Self Care, Joint mobilization, Stair training, DME instructions, and Electrical stimulation  PLAN FOR NEXT SESSION: Nustep, standing in // bars as able, LE strength    Sathvika Ojo B  Lucah Petta PT  12/07/2022, 5:00 PM

## 2022-12-09 ENCOUNTER — Encounter: Payer: Self-pay | Admitting: Family Medicine

## 2022-12-09 ENCOUNTER — Ambulatory Visit (INDEPENDENT_AMBULATORY_CARE_PROVIDER_SITE_OTHER): Payer: BLUE CROSS/BLUE SHIELD | Admitting: Family Medicine

## 2022-12-09 VITALS — BP 86/60 | HR 86 | Temp 97.8°F | Wt 217.0 lb

## 2022-12-09 DIAGNOSIS — I10 Essential (primary) hypertension: Secondary | ICD-10-CM | POA: Diagnosis not present

## 2022-12-09 DIAGNOSIS — R531 Weakness: Secondary | ICD-10-CM | POA: Diagnosis not present

## 2022-12-09 DIAGNOSIS — R627 Adult failure to thrive: Secondary | ICD-10-CM | POA: Insufficient documentation

## 2022-12-09 DIAGNOSIS — E1140 Type 2 diabetes mellitus with diabetic neuropathy, unspecified: Secondary | ICD-10-CM | POA: Diagnosis not present

## 2022-12-09 NOTE — Assessment & Plan Note (Signed)
Chronic, uncontrolled A1c 8.7% up from 3 months ago Goal <8% Continue to recommend balanced, lower carb meals. Smaller meal size, adding snacks. Choosing water as drink of choice and increasing purposeful exercise. Encourage DM eye exam; pt reports "too many appts" and having to "rely on family/friends to assist with transport" On Statin 20 mg On Trulicity 3 mg Gabapentin to assist with neuropathy 800 mg TID On Lantus 20 units qHS; does not check BG to titrate On Metformin 1000 mg BID On lisinopril 5 mg

## 2022-12-09 NOTE — Assessment & Plan Note (Signed)
Chronic, w/c bound Slow improvement Can stand assisted for 5 mins Continues to see PT 2x/week Has AFOs and brace coming Continue to use IUC in s/o w/c

## 2022-12-09 NOTE — Patient Instructions (Signed)
The CDC recommends two doses of Shingrix (the new shingles vaccine) separated by 2 to 6 months for adults age 55 years and older. I recommend checking with your insurance plan regarding coverage for this vaccine.    

## 2022-12-09 NOTE — Progress Notes (Signed)
Established patient visit   Patient: Gabriella Marsh   DOB: 08-31-1966   56 y.o. Female  MRN: [id number] Visit Date: 12/09/2022  Today's healthcare provider: [name], FNP  Re Introduced to nurse practitioner role and practice setting.  All questions answered.  Discussed provider/patient relationship and expectations.   CC: Check A1c and help me get a bedside commode with removable arms  Subjective    HPI  Diabetes Mellitus Type II, Follow-up  Lab Results  Component Value Date   HGBA1C 8.6 (A) 09/10/2022   HGBA1C 13.1 (H) 04/24/2022   HGBA1C 11.8 (H) 01/26/2022   Wt Readings from Last 3 Encounters:  12/09/22 217 lb (98.4 kg)  10/06/22 215 lb (97.5 kg)  09/10/22 220 lb (99.8 kg)   Last seen for diabetes 3 months ago.  Management since then includes monitoring at home. She reports excellent compliance with treatment. She is not having side effects.  Symptoms: No fatigue No foot ulcerations  No appetite changes No nausea  No paresthesia of the feet  No polydipsia  No polyuria Yes visual disturbances   No vomiting     Home blood sugar records:  not being taken  Episodes of hypoglycemia? No    Current insulin regiment: 24 units  Most Recent Eye Exam: 3 years ago Current exercise: PT 2x week  Current diet habits: in general, a "healthy" diet    Pertinent Labs: Lab Results  Component Value Date   CHOL 166 04/24/2022   HDL 33 (L) 04/24/2022   LDLCALC 66 04/24/2022   TRIG 430 (H) 04/24/2022   CHOLHDL 5.0 (H) 04/24/2022   Lab Results  Component Value Date   NA 134 (L) 05/27/2022   K 3.8 05/27/2022   CREATININE 0.84 05/27/2022   GFRNONAA >60 05/27/2022   MICROALBUR 50 10/21/2017   LABMICR 7.4 04/24/2022     ---------------------------------------------------------------------------------------------------  Medications: Outpatient Medications Prior to Visit  Medication Sig Note   atorvastatin (LIPITOR) 20 MG tablet TAKE 1 TABLET(20 MG) BY  MOUTH DAILY    clopidogrel (PLAVIX) 75 MG tablet TAKE 1 TABLET(75 MG) BY MOUTH DAILY    cyclobenzaprine (FLEXERIL) 5 MG tablet Take 1 tablet (5 mg total) by mouth 3 (three) times daily as needed for muscle spasms.    Dulaglutide (TRULICITY) 3 RK/2.7CW SOPN Inject 3 mg as directed once a week.    fluticasone (FLONASE) 50 MCG/ACT nasal spray Place 2 sprays into both nostrils daily.    furosemide (LASIX) 20 MG tablet TAKE 2 TABLETS(40 MG) BY MOUTH DAILY AS NEEDED FOR FLUID RETENTION OR SWELLING    gabapentin (NEURONTIN) 100 MG capsule TAKE 2 CAPSULES THREE TIMES A DAY ALONG WITH 600 MG THREE TIMES A DAY FOR A TOTAL DAILY DOSE OF 2400 MG DAILY    gabapentin (NEURONTIN) 600 MG tablet TAKE 1 TABLET BY MOUTH 3 TIMES  DAILY    Insulin Pen Needle 31G X 5 MM MISC 1 each by Does not apply route daily.    LANTUS SOLOSTAR 100 UNIT/ML Solostar Pen INJECT 20 UNITS UNDER THE SKIN AT BEDTIME, TITRATE TO FASTING SUGAR OF 130-180 AS DIRECTED    levothyroxine (SYNTHROID) 25 MCG tablet Take 1 tablet (25 mcg total) by mouth daily. Please take WITH your 200 mcg dose. Recommend labs in 6-8 weeks.    lisinopril (ZESTRIL) 5 MG tablet TAKE 1 TABLET(5 MG) BY MOUTH DAILY    Melatonin 10 MG TABS Take 10 mg by mouth at bedtime.    metFORMIN (  GLUCOPHAGE) 500 MG tablet Take 2 tablets (1,000 mg total) by mouth 2 (two) times daily with a meal.    nortriptyline (PAMELOR) 25 MG capsule TAKE 2 CAPSULES BY MOUTH AT  BEDTIME    omeprazole (PRILOSEC) 20 MG capsule TAKE 1 CAPSULE(20 MG) BY MOUTH DAILY    oxyCODONE-acetaminophen (PERCOCET) 5-325 MG tablet Take 1 tablet by mouth every 8 (eight) hours as needed for severe pain. Must last 30 days.    oxyCODONE-acetaminophen (PERCOCET) 5-325 MG tablet Take 1-2 tablets by mouth every 12 (twelve) hours as needed for severe pain. Must last 30 days.    traZODone (DESYREL) 50 MG tablet Take 2 tablets (100 mg total) by mouth at bedtime.    [DISCONTINUED] triamterene-hydrochlorothiazide (MAXZIDE-25)  37.5-25 MG tablet TAKE 1 TABLET BY MOUTH  DAILY 12/09/2022: low BP   No facility-administered medications prior to visit.   Review of Systems  Last CBC Lab Results  Component Value Date   WBC 10.6 (H) 05/27/2022   HGB 14.0 05/27/2022   HCT 42.3 05/27/2022   MCV 83.4 05/27/2022   MCH 27.6 05/27/2022   RDW 13.9 05/27/2022   PLT 361 69/67/8938   Last metabolic panel Lab Results  Component Value Date   GLUCOSE 240 (H) 05/27/2022   NA 134 (L) 05/27/2022   K 3.8 05/27/2022   CL 87 (L) 05/27/2022   CO2 32 05/27/2022   BUN 16 05/27/2022   CREATININE 0.84 05/27/2022   GFRNONAA >60 05/27/2022   CALCIUM 9.4 05/27/2022   PROT 7.6 05/27/2022   ALBUMIN 4.0 05/27/2022   LABGLOB 2.7 04/24/2022   AGRATIO 1.7 04/24/2022   BILITOT 1.5 (H) 05/27/2022   ALKPHOS 55 05/27/2022   AST 24 05/27/2022   ALT 20 05/27/2022   ANIONGAP 15 05/27/2022   Last lipids Lab Results  Component Value Date   CHOL 166 04/24/2022   HDL 33 (L) 04/24/2022   LDLCALC 66 04/24/2022   TRIG 430 (H) 04/24/2022   CHOLHDL 5.0 (H) 04/24/2022   Last hemoglobin A1c Lab Results  Component Value Date   HGBA1C 8.6 (A) 09/10/2022      Objective    BP (!) 86/60 (BP Location: Right Arm, Patient Position: Sitting, Cuff Size: Normal)   Pulse 86   Temp 97.8 F (36.6 C) (Oral)   Wt 217 lb (98.4 kg)   SpO2 100%   BMI 31.14 kg/m   BP Readings from Last 3 Encounters:  12/09/22 (!) 86/60  10/06/22 106/71  09/10/22 115/74   Wt Readings from Last 3 Encounters:  12/09/22 217 lb (98.4 kg)  10/06/22 215 lb (97.5 kg)  09/10/22 220 lb (99.8 kg)   SpO2 Readings from Last 3 Encounters:  12/09/22 100%  10/06/22 97%  09/10/22 99%   Physical Exam Vitals and nursing note reviewed.  Constitutional:      General: She is not in acute distress.    Appearance: Normal appearance. She is obese. She is not ill-appearing, toxic-appearing or diaphoretic.  HENT:     Head: Normocephalic and atraumatic.  Cardiovascular:      Rate and Rhythm: Normal rate and regular rhythm.     Pulses: Normal pulses.     Heart sounds: Normal heart sounds. No murmur heard.    No friction rub. No gallop.  Pulmonary:     Effort: Pulmonary effort is normal. No respiratory distress.     Breath sounds: Normal breath sounds. No stridor. No wheezing, rhonchi or rales.  Chest:     Chest wall: No tenderness.  Abdominal:     General: Bowel sounds are normal.     Palpations: Abdomen is soft.  Musculoskeletal:        General: No swelling, tenderness, deformity or signs of injury.     Right lower leg: No edema.     Left lower leg: No edema.     Comments: Asthenia in setting of advanced DM neuropathy and chronic pain   Skin:    General: Skin is warm and dry.     Capillary Refill: Capillary refill takes less than 2 seconds.     Coloration: Skin is not jaundiced or pale.     Findings: No bruising, erythema, lesion or rash.  Neurological:     Mental Status: She is alert and oriented to person, place, and time. Mental status is at baseline.     Cranial Nerves: No cranial nerve deficit.     Sensory: Sensory deficit present.     Motor: Weakness present.     Coordination: Coordination normal.     Gait: Gait abnormal.  Psychiatric:        Mood and Affect: Mood normal.        Behavior: Behavior normal.        Thought Content: Thought content normal.        Judgment: Judgment normal.     No results found for any visits on 12/09/22.  Assessment & Plan     Problem List Items Addressed This Visit       Cardiovascular and Mediastinum   Essential hypertension    Chronic, borderline low today Pt reports poor PO intake Stop Maxzide 25 daily; Continue lisinopril  May drink 30 oz/day Continue to recommend 48-64 oz/water per day to assist with kidney function and DM RTC if symptoms worsen; however, pt remains w/c bound following orthopedic injury/rehab.         Endocrine   Type 2 diabetes mellitus with diabetic neuropathy, without  long-term current use of insulin (HCC) - Primary    Chronic, uncontrolled A1c 8.7% up from 3 months ago Goal <8% Continue to recommend balanced, lower carb meals. Smaller meal size, adding snacks. Choosing water as drink of choice and increasing purposeful exercise. Encourage DM eye exam; pt reports "too many appts" and having to "rely on family/friends to assist with transport" On Statin 20 mg On Trulicity 3 mg Gabapentin to assist with neuropathy 800 mg TID On Lantus 20 units qHS; does not check BG to titrate On Metformin 1000 mg BID On lisinopril 5 mg         Other   Asthenia    Chronic, w/c bound Slow improvement Can stand assisted for 5 mins Continues to see PT 2x/week Has AFOs and brace coming Continue to use IUC in s/o w/c       Return in about 3 months (around 03/10/2023) for T2DM management.     Vonna Kotyk, FNP, have reviewed all documentation for this visit. The documentation on 12/09/22 for the exam, diagnosis, procedures, and orders are all accurate and complete.  [name], [place] [phone] (phone) [phone] (fax)  [place]

## 2022-12-09 NOTE — Assessment & Plan Note (Addendum)
Chronic, borderline low today Pt reports poor PO intake Stop Maxzide 25 daily; Continue lisinopril  May drink 30 oz/day Continue to recommend 48-64 oz/water per day to assist with kidney function and DM RTC if symptoms worsen; however, pt remains w/c bound following orthopedic injury/rehab.

## 2022-12-10 ENCOUNTER — Ambulatory Visit: Payer: BLUE CROSS/BLUE SHIELD | Admitting: Physical Therapy

## 2022-12-10 DIAGNOSIS — R2689 Other abnormalities of gait and mobility: Secondary | ICD-10-CM

## 2022-12-10 DIAGNOSIS — R262 Difficulty in walking, not elsewhere classified: Secondary | ICD-10-CM

## 2022-12-10 DIAGNOSIS — M6281 Muscle weakness (generalized): Secondary | ICD-10-CM

## 2022-12-10 NOTE — Therapy (Signed)
OUTPATIENT PHYSICAL THERAPY LOWER EXTREMITY TREATMENT    Patient Name: Gabriella Marsh MRN: [id number] DOB:18-May-1966, 56 y.o., female Today's Date: 12/11/2022   PT End of Session - 12/11/22 0753     Visit Number 17    Number of Visits 24    Date for PT Re-Evaluation 12/21/22    Authorization Type BCBS Other    Authorization Time Period 09/28/22-12/21/22    Progress Note Due on Visit 20    PT Start Time 1517    PT Stop Time 1559    PT Time Calculation (min) 42 min    Equipment Utilized During Treatment Gait belt    Activity Tolerance Patient tolerated treatment well;No increased pain    Behavior During Therapy WFL for tasks assessed/performed                      Past Medical History:  Diagnosis Date   Arthritis    knees   Blood clotting disorder ([place])    on toe    Degenerative disc disease, lumbar    Diabetes mellitus without complication (HCC)    GERD (gastroesophageal reflux disease)    Headache    daily - AM (has not been able to have SPG blocks lately)   Hyperlipidemia    Hypertension    Neuropathy    feet   Vertigo    3-4x/yr   Past Surgical History:  Procedure Laterality Date   ABDOMINAL HYSTERECTOMY     But still has cervix   AMPUTATION TOE Right 07/24/2021   Procedure: AMPUTATION TOE;  Surgeon: Sharlotte Alamo, DPM;  Location: [facility] ORS;  Service: Podiatry;  Laterality: Right;   CESAREAN SECTION     CHOLECYSTECTOMY     COLONOSCOPY WITH PROPOFOL N/A 02/20/2022   Procedure: COLONOSCOPY WITH PROPOFOL;  Surgeon: [name], MD;  Location: [facility] ENDOSCOPY;  Service: Gastroenterology;  Laterality: N/A;   left arm frature  10/12/2020   OVARIAN CYST SURGERY     PARATHYROIDECTOMY  03/28/2021   Procedure: PARATHYROIDECTOMY AUTOTRANSPLANT;  Surgeon: Fredirick Maudlin, MD;  Location: [facility] ORS;  Service: General;;   right femur surgery Right    SHOULDER SURGERY Left 12/26/014   Dr. Little Ishikawa, [place] N/A 03/28/2021   Procedure:  THYROIDECTOMY, total;  Surgeon: Fredirick Maudlin, MD;  Location: [facility] ORS;  Service: General;  Laterality: N/A;  Provider requesting 3 hours /180 minutes for procedure   TONSILLECTOMY AND ADENOIDECTOMY     UTERINE FIBROID SURGERY     Patient Active Problem List   Diagnosis Date Noted   FTT (failure to thrive) in adult 12/09/2022   Asthenia 12/09/2022   Need for influenza vaccination 09/10/2022   Primary insomnia 09/10/2022   Impaired functional mobility, balance, gait, and endurance 06/09/2022   Type 2 diabetes mellitus with diabetic neuropathy, without long-term current use of insulin ([place]) 04/24/2022   Acquired hypothyroidism 04/24/2022   Hyperlipidemia associated with type 2 diabetes mellitus ([place]) 04/24/2022   Fall 04/24/2022   Dizziness 04/24/2022   Iron deficiency anemia 04/24/2022   Colon cancer screening 01/26/2022   Encounter for screening mammogram for malignant neoplasm of breast 01/26/2022   Chronic sensorimotor polyneuropathy with axonal and demyelinating features 01/12/2022   Osteoarthritis of knees (Bilateral) 01/12/2022   Tricompartment osteoarthritis of knees (Bilateral) 01/12/2022    Grade 1 Retrolisthesis (70m) of L5 over S1 01/12/2022   History of proximal humerus fracture (Left) 01/12/2022   Vitamin D deficiency 01/12/2022   Elevated hemoglobin A1c 01/12/2022  Hypochloremia 01/12/2022   Hyperglycemia 01/12/2022   Chronic peripheral neuropathic pain 01/12/2022   Type 2 diabetes mellitus with peripheral neuropathy ([place]) 01/12/2022   Glands swollen 01/09/2022   Acute cough 01/09/2022   Acute non-recurrent pansinusitis 01/09/2022   Rhinorrhea 01/09/2022   Left acute otitis media 01/09/2022   Pharmacologic therapy 11/12/2021   Disorder of skeletal system 11/12/2021   Problems influencing health status 11/12/2021   Chronic hand pain (1ry area of Pain) (Bilateral) (L>R) 11/12/2021   Chronic feet pain (3ry area of Pain) (Bilateral) (L>R) 11/12/2021   Chronic  shoulder pain (4th area of Pain) (Left) 11/12/2021   Chronic knee pain (5th area of Pain) (Bilateral) (L>R) 11/12/2021   Lumbar facet syndrome (Bilateral) 11/12/2021   Muscle spasm 10/22/2021   Type 2 diabetes mellitus with microalbuminuria, without long-term current use of insulin ([place]) 10/22/2021   Diabetic foot infection ([place]) 07/22/2021   Depression with anxiety 07/22/2021   Sepsis ([place]) 07/22/2021   COVID-19 virus infection 07/22/2021   Hypocalcemia 07/17/2021   Chronic pain syndrome 07/17/2021   S/P total thyroidectomy 03/28/2021   Thyroid nodule    Schamberg's purpura 10/09/2019   Leg swelling 10/09/2019   Benign positional vertigo 02/03/2016   Common migraine with intractable migraine 06/18/2015   Anxiety 05/04/2015   Adaptation reaction 05/03/2015   DDD (degenerative disc disease), lumbosacral 05/03/2015   Hypercholesteremia 05/03/2015   Insomnia due to medical condition 05/03/2015   Adiposity 05/03/2015   Disorder of peripheral nervous system 05/03/2015   Snores 05/03/2015   Diabetic peripheral neuropathy ([place]) 03/06/2015   Essential hypertension 03/06/2015   Elevated liver function tests 03/06/2015   Obesity 03/06/2015   GERD (gastroesophageal reflux disease) 03/06/2015   Multinodular goiter 03/06/2015   RLS (restless legs syndrome) 03/06/2015   Chronic low back pain (2ry area of Pain) (Bilateral) (L>R) w/o sciatica 03/06/2015   Leg cramps 03/06/2015    PCP: [name], FNP  REFERRING PROVIDER: [name], FNP  REFERRING DIAG: [phone] (ICD-10-CM) - Fall, subsequent encounter  THERAPY DIAG:  Other abnormalities of gait and mobility  Difficulty in walking, not elsewhere classified  Muscle weakness (generalized)  Rationale for Evaluation and Treatment Rehabilitation  ONSET DATE: 06/18/22  SUBJECTIVE:   SUBJECTIVE STATEMENT:  Pt reports she is tired today but no worse than usual. Reports difficult time at [employer] due to inaccessibility of  handicap parking  PERTINENT HISTORY: Pt reports her fall initially occurred in the evening when her husband was not home. Pt got up and had her Rolator and she walked toward her hallway. When she turned around to turn the light on she went completely down on her R leg and broke her femur distally and twisted her knee. When EMT arrived she had to straighten her LE to immobilize it. Pt went to [facility] hospital for the trauma.   Upon arrival she was evaluated and had to get MRI and imaging. Progressed to 06/19/22. Pt had surgery that night. Pt had to have plate and screws in her knee and rod in her femur. Pt was in hospital for 11 days then progressed to [facility] for 30 days. [facility].   Upon coming home with [facility] pt came home with catheter. Pt came home and was relieved. Pt husband helps with everything doing "everything" for her except she helps with cooking.   Pt considered home health physical therapy but the cost of care would be too high ( $125  per visit) and this.   Prior to injury pt was ambulating with rolator. Pt used bedside commode at that time. Pt reports 6 falls during this year. Pt reports 2-3 falls with rolator and 2-3 falls walking between bathroom and bedroom and 1 fall in bedroom.   Pt had another fall at [facility] where she broke her left humerus about 2 years ago.   PAIN:  Are you having pain? No  PRECAUTIONS: Other: WBAT  WEIGHT BEARING RESTRICTIONS     Gardenia Phlegm, MD MPH - 09/03/2022 3:15 PM EDT   Formatting of this note might be different from the original.  At this time, you can continue weightbearing as tolerated to right lower extremity. Please continue working on range of motion and strengthening. We have given you prescription for physical therapy.   FALLS:  Has patient fallen in last 6 months? Yes. 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 Pt does not know if she will be able to sit in one position for that long. Not sure if it will be feasible to get back to work.   PLOF: Requires assistive device for independence, Needs assistance with ADLs, Needs assistance with homemaking, and Needs assistance with gait  PATIENT GOALS Pt would like to get legs moving a little bit more. Pt wants to be able to get to bathroom on her own.  Get upper body strength improved.      TODAY'S TREATMENT: 12/11/22  TE Ball roll outs with glute set at end range for 5 seconds 2 x 15   STS with 2 x Max A for transition then Min A in standing to maintain balance Stands for 5 min 30 sec with UE support and CGA to Min A. Pt anterior blocking knee for safety.   LAQ x 8 on R ann 15 AAROM on L ,  4# on R and AAROM on L with lifting the thigh for foot clearance, no resistance  Marching in seated with AROM on R and AAROM on L 2 x 15 ea   Seaded hip Adduction with ball 2 x 10 reps   Chair press up with glute activation 2 x 5 reps, instructed to complete as Hep for improvement in STS muscle activation.           PATIENT EDUCATION:  Education details: HEP, POC Person educated: Patient Education method: Explanation Education comprehension: verbalized understanding   HOME EXERCISE PROGRAM: Access Code: [email or web address] URL: [email or web address] Date: 09/28/2022 Prepared by: Rivka Barbara  Exercises - Seated March  - 1 x daily - 7 x weekly - 2 sets - 10 reps - Seated Long Arc Quad  - 1 x daily - 7 x weekly - 2 sets - 10 reps - Seated Heel Raise  - 1 x daily - 7 x weekly - 2 sets - 20 reps - Seated hip abduction with band  - 1 x daily - 7 x  weekly - 2 sets - 10 reps - Seated Hip Adduction Isometrics with Ball  - 1 x daily - 7 x weekly - 2 sets - 10 reps  ASSESSMENT:  CLINICAL IMPRESSION: Pt presents with great  motivation for completion of PT activities. Pt continues with lower extremity strengthening interventions in order to improve her lower extremity muscle strength.  Pt had increased difficulty with hip march this session but was toward end of strengthening intervention. Pt did have bad night of sleep which she related to increased weakness this date. Pt will continue to benefit from skilled physical therapy intervention to address impairments, improve QOL, and attain therapy goals.     OBJECTIVE IMPAIRMENTS Abnormal gait, decreased activity tolerance, decreased balance, decreased endurance, decreased mobility, difficulty walking, decreased ROM, decreased strength, impaired perceived functional ability, and impaired sensation.   ACTIVITY LIMITATIONS carrying, lifting, bending, standing, squatting, stairs, transfers, bed mobility, bathing, toileting, dressing, self feeding, hygiene/grooming, and locomotion level  PARTICIPATION LIMITATIONS: meal prep, cleaning, laundry, driving, shopping, community activity, occupation, and yard work  PERSONAL FACTORS 3+ comorbidities: Polyneuropathy, hypertension, hyperlipidemia, diabetes mellitus, arthritis  are also affecting patient's functional outcome.   REHAB POTENTIAL: Fair secondary to comorbidities above particularly polyneuropathy affecting her lower extremity strength and muscle activation  CLINICAL DECISION MAKING: Evolving/moderate complexity  EVALUATION COMPLEXITY: Moderate   GOALS: Goals reviewed with patient? Yes  SHORT TERM GOALS: Target date: 10/26/2022     Patient will be independent in home exercise program to improve strength/mobility for better functional independence with ADLs. Baseline: No HEP currently 11/15: Completing Hep regularly.  Goal status: Met     LONG TERM GOALS: Target date: 01/27/2023   Patient will be modified independent for slide board transfers in order to improve her ability to transfer to and from wheelchair as well as bedside commode for improvement in ADLs. Baseline: Patient currently requires assistance with slide board transfers is unable to utilize bedside commode and requires use of catheter and bedpan 11/15: Pt completing slide board transfers and only requires assistance with placing slide board and maneuvering WC Goal status: IN PROGRESS  2.  Patient will perform sit to and from stand with min assist from physical therapist in order to indicate improved lower extremity functional strength and improved ability to perform functional transfers and functional leg activities within her home. Baseline: Patient unable to stand independently at this time due to lower extremity weakness 11/04/2022: Patient is able to perform anterior lean and pull on parallel bars and activate gluteal muscles was unable to lift weight from wheelchair Goal status: INITIAL  3.  Patient will improve focus on therapeutic outcome score to 35 or greater in order to indicate improved perceived functional ability Baseline: 19 11/15: 40 Goal status: MET  4.  Patient will perform stand pivot transfer from wheelchair to mat table with moderate assistance or less in order to improve her ability to perform transfers within the home to improve her overall functional capacity. Baseline: Unable to complete 11/04/2022: Patient is able to perform anterior lean and pull on parallel bars and activate gluteal muscles was unable to lift weight from wheelchair Goal status: INITIAL  4.  Pt will improve LEFS by 10 points or more in order to indicate improved LE function and mobility.  Baseline: 9 Goal status: INITIAL    PLAN: PT FREQUENCY: 2x/week  PT DURATION: 12 weeks  PLANNED INTERVENTIONS: Therapeutic exercises, Therapeutic activity, Neuromuscular  re-education, Balance training, Gait training, Patient/Family education, Self Care, Joint mobilization, Stair training, DME instructions, and Electrical stimulation  PLAN FOR NEXT SESSION: Nustep, standing in // bars as able, LE strength, supine strengthening potentially    Particia Lather PT  12/11/2022, 9:13 AM

## 2022-12-11 ENCOUNTER — Encounter: Payer: Self-pay | Admitting: Physical Therapy

## 2022-12-16 NOTE — Therapy (Signed)
OUTPATIENT PHYSICAL THERAPY LOWER EXTREMITY TREATMENT    Patient Name: Gabriella Marsh MRN: [id number] DOB:1966/11/24, 56 y.o., female Today's Date: 12/17/2022   PT End of Session - 12/17/22 1351     Visit Number 18    Number of Visits 24    Date for PT Re-Evaluation 12/21/22    Authorization Type [employer] Other    Authorization Time Period 09/28/22-12/21/22    Progress Note Due on Visit 20    PT Start Time 1348    PT Stop Time 1429    PT Time Calculation (min) 41 min    Equipment Utilized During Treatment Gait belt    Activity Tolerance Patient tolerated treatment well;No increased pain    Behavior During Therapy WFL for tasks assessed/performed                       Past Medical History:  Diagnosis Date   Arthritis    knees   Blood clotting disorder (HCC)    on toe    Degenerative disc disease, lumbar    Diabetes mellitus without complication (HCC)    GERD (gastroesophageal reflux disease)    Headache    daily - AM (has not been able to have SPG blocks lately)   Hyperlipidemia    Hypertension    Neuropathy    feet   Vertigo    3-4x/yr   Past Surgical History:  Procedure Laterality Date   ABDOMINAL HYSTERECTOMY     But still has cervix   AMPUTATION TOE Right 07/24/2021   Procedure: AMPUTATION TOE;  Surgeon: Linus Galas, DPM;  Location: [facility] ORS;  Service: Podiatry;  Laterality: Right;   CESAREAN SECTION     CHOLECYSTECTOMY     COLONOSCOPY WITH PROPOFOL N/A 02/20/2022   Procedure: COLONOSCOPY WITH PROPOFOL;  Surgeon: Wyline Mood, MD;  Location: [facility] ENDOSCOPY;  Service: Gastroenterology;  Laterality: N/A;   left arm frature  10/12/2020   OVARIAN CYST SURGERY     PARATHYROIDECTOMY  03/28/2021   Procedure: PARATHYROIDECTOMY AUTOTRANSPLANT;  Surgeon: Duanne Guess, MD;  Location: [facility] ORS;  Service: General;;   right femur surgery Right    SHOULDER SURGERY Left 12/26/014   Dr. Elfredia Nevins, [facility]   THYROIDECTOMY N/A 03/28/2021   Procedure:  THYROIDECTOMY, total;  Surgeon: Duanne Guess, MD;  Location: [facility] ORS;  Service: General;  Laterality: N/A;  Provider requesting 3 hours /180 minutes for procedure   TONSILLECTOMY AND ADENOIDECTOMY     UTERINE FIBROID SURGERY     Patient Active Problem List   Diagnosis Date Noted   FTT (failure to thrive) in adult 12/09/2022   Asthenia 12/09/2022   Need for influenza vaccination 09/10/2022   Primary insomnia 09/10/2022   Impaired functional mobility, balance, gait, and endurance 06/09/2022   Type 2 diabetes mellitus with diabetic neuropathy, without long-term current use of insulin (HCC) 04/24/2022   Acquired hypothyroidism 04/24/2022   Hyperlipidemia associated with type 2 diabetes mellitus (HCC) 04/24/2022   Fall 04/24/2022   Dizziness 04/24/2022   Iron deficiency anemia 04/24/2022   Colon cancer screening 01/26/2022   Encounter for screening mammogram for malignant neoplasm of breast 01/26/2022   Chronic sensorimotor polyneuropathy with axonal and demyelinating features 01/12/2022   Osteoarthritis of knees (Bilateral) 01/12/2022   Tricompartment osteoarthritis of knees (Bilateral) 01/12/2022    Grade 1 Retrolisthesis (9mm) of L5 over S1 01/12/2022   History of proximal humerus fracture (Left) 01/12/2022   Vitamin D deficiency 01/12/2022   Elevated hemoglobin A1c 01/12/2022  Hypochloremia 01/12/2022   Hyperglycemia 01/12/2022   Chronic peripheral neuropathic pain 01/12/2022   Type 2 diabetes mellitus with peripheral neuropathy (HCC) 01/12/2022   Glands swollen 01/09/2022   Acute cough 01/09/2022   Acute non-recurrent pansinusitis 01/09/2022   Rhinorrhea 01/09/2022   Left acute otitis media 01/09/2022   Pharmacologic therapy 11/12/2021   Disorder of skeletal system 11/12/2021   Problems influencing health status 11/12/2021   Chronic hand pain (1ry area of Pain) (Bilateral) (L>R) 11/12/2021   Chronic feet pain (3ry area of Pain) (Bilateral) (L>R) 11/12/2021   Chronic  shoulder pain (4th area of Pain) (Left) 11/12/2021   Chronic knee pain (5th area of Pain) (Bilateral) (L>R) 11/12/2021   Lumbar facet syndrome (Bilateral) 11/12/2021   Muscle spasm 10/22/2021   Type 2 diabetes mellitus with microalbuminuria, without long-term current use of insulin (HCC) 10/22/2021   Diabetic foot infection (HCC) 07/22/2021   Depression with anxiety 07/22/2021   Sepsis (HCC) 07/22/2021   COVID-19 virus infection 07/22/2021   Hypocalcemia 07/17/2021   Chronic pain syndrome 07/17/2021   S/P total thyroidectomy 03/28/2021   Thyroid nodule    Schamberg's purpura 10/09/2019   Leg swelling 10/09/2019   Benign positional vertigo 02/03/2016   Common migraine with intractable migraine 06/18/2015   Anxiety 05/04/2015   Adaptation reaction 05/03/2015   DDD (degenerative disc disease), lumbosacral 05/03/2015   Hypercholesteremia 05/03/2015   Insomnia due to medical condition 05/03/2015   Adiposity 05/03/2015   Disorder of peripheral nervous system 05/03/2015   Snores 05/03/2015   Diabetic peripheral neuropathy (HCC) 03/06/2015   Essential hypertension 03/06/2015   Elevated liver function tests 03/06/2015   Obesity 03/06/2015   GERD (gastroesophageal reflux disease) 03/06/2015   Multinodular goiter 03/06/2015   RLS (restless legs syndrome) 03/06/2015   Chronic low back pain (2ry area of Pain) (Bilateral) (L>R) w/o sciatica 03/06/2015   Leg cramps 03/06/2015    PCP: Jacky Kindle, FNP  REFERRING PROVIDER: Jacky Kindle, FNP  REFERRING DIAG: [phone] (ICD-10-CM) - Fall, subsequent encounter  THERAPY DIAG:  Other abnormalities of gait and mobility  Difficulty in walking, not elsewhere classified  Muscle weakness (generalized)  Rationale for Evaluation and Treatment Rehabilitation  ONSET DATE: 06/18/22  SUBJECTIVE:   SUBJECTIVE STATEMENT:  Pt reports she is doing well today with no significant changes since last session. Pt report she had a good christmas.    PERTINENT HISTORY: Pt reports her fall initially occurred in the evening when her husband was not home. Pt got up and had her Rolator and she walked toward her hallway. When she turned around to turn the light on she went completely down on her R leg and broke her femur distally and twisted her knee. When EMT arrived she had to straighten her LE to immobilize it. Pt went to [facility] hospital for the trauma.   Upon arrival she was evaluated and had to get MRI and imaging. Progressed to 06/19/22. Pt had surgery that night. Pt had to have plate and screws in her knee and rod in her femur. Pt was in hospital for 11 days then progressed to [facility] for 30 days. Pt had rough time [facility].   Upon coming home with [facility] pt came home with catheter. Pt came home and was relieved. Pt husband helps with everything doing "everything" for her except she helps with cooking.   Pt considered home health physical therapy but the cost of care would be too high ( $125 per  visit) and this.   Prior to injury pt was ambulating with rolator. Pt used bedside commode at that time. Pt reports 6 falls during this year. Pt reports 2-3 falls with rolator and 2-3 falls walking between bathroom and bedroom and 1 fall in bedroom.   Pt had another fall at [employer] where she broke her left humerus about 2 years ago.   PAIN:  Are you having pain? No  PRECAUTIONS: Other: WBAT  WEIGHT BEARING RESTRICTIONS     Ashley Mariner, MD MPH - 09/03/2022 3:15 PM EDT   Formatting of this note might be different from the original.  At this time, you can continue weightbearing as tolerated to right lower extremity. Please continue working on range of motion and strengthening. We have given you prescription for physical therapy.   FALLS:  Has patient fallen in last 6 months? Yes. 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 Pt does not know if she will be able to sit in one position for that long. Not sure if it will be feasible to get back to work.   PLOF: Requires assistive device for independence, Needs assistance with ADLs, Needs assistance with homemaking, and Needs assistance with gait  PATIENT GOALS Pt would like to get legs moving a little bit more. Pt wants to be able to get to bathroom on her own.  Get upper body strength improved.      TODAY'S TREATMENT: 12/17/22  TE - all performed in [facility] with leg supports removed.   Ball roll outs with glute set at end range for 5 seconds 2 x 15   Heel raises 3 x 10 seated   Marching 3 x 10 in seated  Marching in seated with AROM on R and AROM on L but has difficulty with keeping hip orientation 2 x 15 ea   STS with 2 x Max A for transition then Min A in standing to maintain balance -Pt able to walk 6 feet in // bars with heavy UE assist and 2 x mod A for balance and stability, not a safe activity for home without skilled PT for assistance. Cues for heel lift prior to step. Pt takes step with R LE then uses step to with L LE then repeats for ambulatory bout.  Seated rest Repeat above with larger steps but quicker to fatigue. WC follow throughout for safety.Difficulty with L LE foot clearance due to foot drop and weakness.    LAQ x 10 on R ann 15 AAROM on L ,  4# on R and AAROM on L with assistance lifting the thigh for foot clearance, no resistance  Seated hip Adduction with ball 2 x 10 reps            PATIENT EDUCATION:  Education details: HEP, POC Person educated: Patient Education method: Explanation Education comprehension: verbalized  understanding   HOME EXERCISE PROGRAM: Access Code: [email or web address] URL: [email or web address] Date: 09/28/2022 Prepared by: Thresa Ross  Exercises - Seated March  - 1 x daily - 7 x weekly - 2 sets - 10 reps - Seated Long Arc Quad  - 1 x daily - 7 x  weekly - 2 sets - 10 reps - Seated Heel Raise  - 1 x daily - 7 x weekly - 2 sets - 20 reps - Seated hip abduction with band  - 1 x daily - 7 x weekly - 2 sets - 10 reps - Seated Hip Adduction Isometrics with Ball  - 1 x daily - 7 x weekly - 2 sets - 10 reps  ASSESSMENT:  CLINICAL IMPRESSION: Pt presents with great motivation for completion of PT activities. Pt continues with lower extremity strengthening interventions in order to improve her lower extremity muscle strength.  Pt able to take small steps with significant UE support and with significant assistance from skilled PT to make task safe. Pt will continue to benefit from skilled physical therapy intervention to address impairments, improve QOL, and attain therapy goals.     OBJECTIVE IMPAIRMENTS Abnormal gait, decreased activity tolerance, decreased balance, decreased endurance, decreased mobility, difficulty walking, decreased ROM, decreased strength, impaired perceived functional ability, and impaired sensation.   ACTIVITY LIMITATIONS carrying, lifting, bending, standing, squatting, stairs, transfers, bed mobility, bathing, toileting, dressing, self feeding, hygiene/grooming, and locomotion level  PARTICIPATION LIMITATIONS: meal prep, cleaning, laundry, driving, shopping, community activity, occupation, and yard work  PERSONAL FACTORS 3+ comorbidities: Polyneuropathy, hypertension, hyperlipidemia, diabetes mellitus, arthritis  are also affecting patient's functional outcome.   REHAB POTENTIAL: Fair secondary to comorbidities above particularly polyneuropathy affecting her lower extremity strength and muscle activation  CLINICAL DECISION MAKING: Evolving/moderate  complexity  EVALUATION COMPLEXITY: Moderate   GOALS: Goals reviewed with patient? Yes  SHORT TERM GOALS: Target date: 10/26/2022     Patient will be independent in home exercise program to improve strength/mobility for better functional independence with ADLs. Baseline: No HEP currently 11/15: Completing Hep regularly.  Goal status: Met    LONG TERM GOALS: Target date: 01/27/2023   Patient will be modified independent for slide board transfers in order to improve her ability to transfer to and from wheelchair as well as bedside commode for improvement in ADLs. Baseline: Patient currently requires assistance with slide board transfers is unable to utilize bedside commode and requires use of catheter and bedpan 11/15: Pt completing slide board transfers and only requires assistance with placing slide board and maneuvering WC Goal status: IN PROGRESS  2.  Patient will perform sit to and from stand with min assist from physical therapist in order to indicate improved lower extremity functional strength and improved ability to perform functional transfers and functional leg activities within her home. Baseline: Patient unable to stand independently at this time due to lower extremity weakness 11/04/2022: Patient is able to perform anterior lean and pull on parallel bars and activate gluteal muscles was unable to lift weight from wheelchair Goal status: INITIAL  3.  Patient will improve focus on therapeutic outcome score to 35 or greater in order to indicate improved perceived functional ability Baseline: 19 11/15: 40 Goal status: MET  4.  Patient will perform stand pivot transfer from wheelchair to mat table with moderate assistance or less in order to improve her ability to perform transfers within the home to improve her overall functional capacity. Baseline: Unable to complete 11/04/2022: Patient is able to perform anterior lean and pull on parallel bars and activate gluteal muscles was  unable to lift weight from wheelchair Goal status: INITIAL  4.  Pt will improve LEFS by 10 points or more in order to indicate improved LE function and mobility.  Baseline: 9 Goal status: INITIAL    PLAN: PT  FREQUENCY: 2x/week  PT DURATION: 12 weeks  PLANNED INTERVENTIONS: Therapeutic exercises, Therapeutic activity, Neuromuscular re-education, Balance training, Gait training, Patient/Family education, Self Care, Joint mobilization, Stair training, DME instructions, and Electrical stimulation  PLAN FOR NEXT SESSION: Nustep, standing in // bars as able, LE strength, supine strengthening potentially    Norman Herrlich PT  12/17/2022, 4:56 PM

## 2022-12-17 ENCOUNTER — Ambulatory Visit: Payer: BLUE CROSS/BLUE SHIELD | Admitting: Physical Therapy

## 2022-12-17 ENCOUNTER — Encounter: Payer: Self-pay | Admitting: Physical Therapy

## 2022-12-17 DIAGNOSIS — R2689 Other abnormalities of gait and mobility: Secondary | ICD-10-CM

## 2022-12-17 DIAGNOSIS — R262 Difficulty in walking, not elsewhere classified: Secondary | ICD-10-CM

## 2022-12-17 DIAGNOSIS — M6281 Muscle weakness (generalized): Secondary | ICD-10-CM

## 2022-12-22 ENCOUNTER — Ambulatory Visit: Payer: BC Managed Care – PPO | Attending: Orthopedic Surgery | Admitting: Physical Therapy

## 2022-12-22 DIAGNOSIS — M6281 Muscle weakness (generalized): Secondary | ICD-10-CM | POA: Diagnosis not present

## 2022-12-22 DIAGNOSIS — R262 Difficulty in walking, not elsewhere classified: Secondary | ICD-10-CM | POA: Insufficient documentation

## 2022-12-22 DIAGNOSIS — R2689 Other abnormalities of gait and mobility: Secondary | ICD-10-CM | POA: Diagnosis not present

## 2022-12-22 NOTE — Therapy (Unsigned)
OUTPATIENT PHYSICAL THERAPY LOWER EXTREMITY TREATMENT/ Recert ***    Patient Name: Gabriella Marsh MRN: [id number] DOB:04-Nov-1966, 57 y.o., female Today's Date: 12/22/2022               Past Medical History:  Diagnosis Date   Arthritis    knees   Blood clotting disorder ([place])    on toe    Degenerative disc disease, lumbar    Diabetes mellitus without complication (HCC)    GERD (gastroesophageal reflux disease)    Headache    daily - AM (has not been able to have SPG blocks lately)   Hyperlipidemia    Hypertension    Neuropathy    feet   Vertigo    3-4x/yr   Past Surgical History:  Procedure Laterality Date   ABDOMINAL HYSTERECTOMY     But still has cervix   AMPUTATION TOE Right 07/24/2021   Procedure: AMPUTATION TOE;  Surgeon: Sharlotte Alamo, DPM;  Location: [facility] ORS;  Service: Podiatry;  Laterality: Right;   CESAREAN SECTION     CHOLECYSTECTOMY     COLONOSCOPY WITH PROPOFOL N/A 02/20/2022   Procedure: COLONOSCOPY WITH PROPOFOL;  Surgeon: [name], MD;  Location: [facility] ENDOSCOPY;  Service: Gastroenterology;  Laterality: N/A;   left arm frature  10/12/2020   OVARIAN CYST SURGERY     PARATHYROIDECTOMY  03/28/2021   Procedure: PARATHYROIDECTOMY AUTOTRANSPLANT;  Surgeon: Fredirick Maudlin, MD;  Location: [facility] ORS;  Service: General;;   right femur surgery Right    SHOULDER SURGERY Left 12/26/014   Dr. Little Ishikawa, [place] N/A 03/28/2021   Procedure: THYROIDECTOMY, total;  Surgeon: Fredirick Maudlin, MD;  Location: [facility] ORS;  Service: General;  Laterality: N/A;  Provider requesting 3 hours /180 minutes for procedure   TONSILLECTOMY AND ADENOIDECTOMY     UTERINE FIBROID SURGERY     Patient Active Problem List   Diagnosis Date Noted   FTT (failure to thrive) in adult 12/09/2022   Asthenia 12/09/2022   Need for influenza vaccination 09/10/2022   Primary insomnia 09/10/2022   Impaired functional mobility, balance, gait, and endurance 06/09/2022    Type 2 diabetes mellitus with diabetic neuropathy, without long-term current use of insulin ([place]) 04/24/2022   Acquired hypothyroidism 04/24/2022   Hyperlipidemia associated with type 2 diabetes mellitus ([place]) 04/24/2022   Fall 04/24/2022   Dizziness 04/24/2022   Iron deficiency anemia 04/24/2022   Colon cancer screening 01/26/2022   Encounter for screening mammogram for malignant neoplasm of breast 01/26/2022   Chronic sensorimotor polyneuropathy with axonal and demyelinating features 01/12/2022   Osteoarthritis of knees (Bilateral) 01/12/2022   Tricompartment osteoarthritis of knees (Bilateral) 01/12/2022    Grade 1 Retrolisthesis (68m) of L5 over S1 01/12/2022   History of proximal humerus fracture (Left) 01/12/2022   Vitamin D deficiency 01/12/2022   Elevated hemoglobin A1c 01/12/2022   Hypochloremia 01/12/2022   Hyperglycemia 01/12/2022   Chronic peripheral neuropathic pain 01/12/2022   Type 2 diabetes mellitus with peripheral neuropathy ([place] 01/12/2022   Glands swollen 01/09/2022   Acute cough 01/09/2022   Acute non-recurrent pansinusitis 01/09/2022   Rhinorrhea 01/09/2022   Left acute otitis media 01/09/2022   Pharmacologic therapy 11/12/2021   Disorder of skeletal system 11/12/2021   Problems influencing health status 11/12/2021   Chronic hand pain (1ry area of Pain) (Bilateral) (L>R) 11/12/2021   Chronic feet pain (3ry area of Pain) (Bilateral) (L>R) 11/12/2021   Chronic shoulder pain (4th area of Pain) (Left) 11/12/2021   Chronic knee pain (5th area  of Pain) (Bilateral) (L>R) 11/12/2021   Lumbar facet syndrome (Bilateral) 11/12/2021   Muscle spasm 10/22/2021   Type 2 diabetes mellitus with microalbuminuria, without long-term current use of insulin ([place]) 10/22/2021   Diabetic foot infection ([place]) 07/22/2021   Depression with anxiety 07/22/2021   Sepsis ([place]) 07/22/2021   COVID-19 virus infection 07/22/2021   Hypocalcemia 07/17/2021   Chronic pain syndrome  07/17/2021   S/P total thyroidectomy 03/28/2021   Thyroid nodule    Schamberg's purpura 10/09/2019   Leg swelling 10/09/2019   Benign positional vertigo 02/03/2016   Common migraine with intractable migraine 06/18/2015   Anxiety 05/04/2015   Adaptation reaction 05/03/2015   DDD (degenerative disc disease), lumbosacral 05/03/2015   Hypercholesteremia 05/03/2015   Insomnia due to medical condition 05/03/2015   Adiposity 05/03/2015   Disorder of peripheral nervous system 05/03/2015   Snores 05/03/2015   Diabetic peripheral neuropathy ([place]) 03/06/2015   Essential hypertension 03/06/2015   Elevated liver function tests 03/06/2015   Obesity 03/06/2015   GERD (gastroesophageal reflux disease) 03/06/2015   Multinodular goiter 03/06/2015   RLS (restless legs syndrome) 03/06/2015   Chronic low back pain (2ry area of Pain) (Bilateral) (L>R) w/o sciatica 03/06/2015   Leg cramps 03/06/2015    PCP: [name], FNP  REFERRING PROVIDER: [name], FNP  REFERRING DIAG: [phone].XXXD (ICD-10-CM) - Fall, subsequent encounter  THERAPY DIAG:  No diagnosis found.  Rationale for Evaluation and Treatment Rehabilitation  ONSET DATE: 06/18/22  SUBJECTIVE:   SUBJECTIVE STATEMENT:  Pt reports she is doing well today with no significant changes since last session. Pt report she had a good christmas.   PERTINENT HISTORY: Pt reports her fall initially occurred in the evening when her husband was not home. Pt got up and had her Rolator and she walked toward her hallway. When she turned around to turn the light on she went completely down on her R leg and broke her femur distally and twisted her knee. When EMT arrived she had to straighten her LE to immobilize it. Pt went to [facility] hospital for the trauma.   Upon arrival she was evaluated and had to get MRI and imaging. Progressed to 06/19/22. Pt had surgery that night. Pt had to have plate and screws in her knee and rod in her femur. Pt was in hospital  for 11 days then progressed to [facility] for [facility] days. [facility].   Upon coming home with [facility] pt came home with catheter. Pt came home and was relieved. Pt husband helps with everything doing "everything" for her except she helps with cooking.   Pt considered home health physical therapy but the cost of care would be too high ( $125 per visit) and this.   Prior to injury pt was ambulating with rolator. Pt used bedside commode at that time. Pt reports 6 falls during this year. Pt reports 2-3 falls with rolator and 2-3 falls walking between bathroom and bedroom and 1 fall in bedroom.   Pt had another fall at [facility] where she broke her left humerus about 2 years ago.   PAIN:  Are you having pain? No  PRECAUTIONS: Other: WBAT  WEIGHT BEARING RESTRICTIONS     Gardenia Phlegm, MD MPH - 09/03/2022 3:15 PM EDT   Formatting of this note might be different from the original.  At this time, you can continue weightbearing as tolerated to right lower extremity. Please continue working on  range of motion and strengthening. We have given you prescription for physical therapy.   FALLS:  Has patient fallen in last 6 months? Yes. 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 Pt does not know if she will be able to sit in one position for that long. Not sure if it will be feasible to get back to work.   PLOF: Requires assistive device for independence, Needs assistance with ADLs, Needs assistance with homemaking, and Needs assistance with gait  PATIENT GOALS Pt would like to get legs moving a  little bit more. Pt wants to be able to get to bathroom on her own.  Get upper body strength improved.      TODAY'S TREATMENT: 12/22/22  TE - all performed in [facility] with leg supports removed.  Ball roll outs with glute set at end range for 5 seconds 2 x 15   Heel raises 3 x 10 seated   Marching 3 x 10 in seated  Marching in seated with AROM on R and AROM on L but has difficulty with keeping hip orientation 2 x 15 ea   STS with 2 x Max A for transition then Min A in standing to maintain balance -Pt able to walk 6 feet in // bars with heavy UE assist and 2 x mod A for balance and stability, not a safe activity for home without skilled PT for assistance. Cues for heel lift prior to step. Pt takes step with R LE then uses step to with L LE then repeats for ambulatory bout.  Seated rest Repeat above with larger steps but quicker to fatigue. WC follow throughout for safety.Difficulty with L LE foot clearance due to foot drop and weakness.    LAQ x 10 on R ann 15 AAROM on L ,  4# on R and AAROM on L with assistance lifting the thigh for foot clearance, no resistance  Seated hip Adduction with ball 2 x 10 reps            PATIENT EDUCATION:  Education details: HEP, POC Person educated: Patient Education method: Explanation Education comprehension: verbalized understanding   HOME EXERCISE PROGRAM: Access Code: [email or web address] URL: [email or web address] Date: 09/28/2022 Prepared by: Rivka Barbara  Exercises - Seated March  - 1 x daily - 7 x weekly - 2 sets - 10 reps - Seated Long Arc Quad  - 1 x daily - 7 x weekly - 2 sets - 10 reps - Seated Heel Raise  - 1 x daily - 7 x weekly - 2 sets - 20 reps - Seated hip abduction with band  - 1 x daily - 7 x weekly - 2 sets - 10 reps - Seated Hip Adduction Isometrics with Ball  - 1 x daily - 7 x weekly - 2 sets - 10 reps  ASSESSMENT:  CLINICAL IMPRESSION: Pt presents with great motivation for completion of PT activities.  Pt continues with lower extremity strengthening interventions in order to improve her lower extremity muscle strength.  Pt able to take small steps with significant UE support and with significant assistance from skilled PT to make task safe. Pt will continue to benefit  from skilled physical therapy intervention to address impairments, improve QOL, and attain therapy goals.     OBJECTIVE IMPAIRMENTS Abnormal gait, decreased activity tolerance, decreased balance, decreased endurance, decreased mobility, difficulty walking, decreased ROM, decreased strength, impaired perceived functional ability, and impaired sensation.   ACTIVITY LIMITATIONS carrying, lifting, bending, standing, squatting, stairs, transfers, bed mobility, bathing, toileting, dressing, self feeding, hygiene/grooming, and locomotion level  PARTICIPATION LIMITATIONS: meal prep, cleaning, laundry, driving, shopping, community activity, occupation, and yard work  PERSONAL FACTORS 3+ comorbidities: Polyneuropathy, hypertension, hyperlipidemia, diabetes mellitus, arthritis  are also affecting patient's functional outcome.   REHAB POTENTIAL: Fair secondary to comorbidities above particularly polyneuropathy affecting her lower extremity strength and muscle activation  CLINICAL DECISION MAKING: Evolving/moderate complexity  EVALUATION COMPLEXITY: Moderate   GOALS: Goals reviewed with patient? Yes  SHORT TERM GOALS: Target date: 10/26/2022     Patient will be independent in home exercise program to improve strength/mobility for better functional independence with ADLs. Baseline: No HEP currently 11/15: Completing Hep regularly.  Goal status: Met    LONG TERM GOALS: Target date: 01/27/2023   Patient will be modified independent for slide board transfers in order to improve her ability to transfer to and from wheelchair as well as bedside commode for improvement in ADLs. Baseline: Patient currently requires assistance with slide  board transfers is unable to utilize bedside commode and requires use of catheter and bedpan 11/15: Pt completing slide board transfers and only requires assistance with placing slide board and maneuvering WC Goal status: IN PROGRESS  2.  Patient will perform sit to and from stand with min assist from physical therapist in order to indicate improved lower extremity functional strength and improved ability to perform functional transfers and functional leg activities within her home. Baseline: Patient unable to stand independently at this time due to lower extremity weakness 11/04/2022: Patient is able to perform anterior lean and pull on parallel bars and activate gluteal muscles was unable to lift weight from wheelchair Goal status: INITIAL  3.  Patient will improve focus on therapeutic outcome score to 35 or greater in order to indicate improved perceived functional ability Baseline: 19 11/15: 40 12/22/22: 43 Goal status: MET  4.  Patient will perform stand pivot transfer from wheelchair to mat table with moderate assistance or less in order to improve her ability to perform transfers within the home to improve her overall functional capacity. Baseline: Unable to complete 11/04/2022: Patient is able to perform anterior lean and pull on parallel bars and activate gluteal muscles was unable to lift weight from wheelchair 12/22/22: Able to stand in // bars with B UE support and with 2 x Mod-Max A from PT and with PT stabilizing LE at knee.  Goal status: INITIAL  4.  Pt will improve LEFS by 10 points or more in order to indicate improved LE function and mobility.  Baseline: 9 : 12/22/22: 26 Goal status: INITIAL  5. *** Goal for standing with walker with husband support from elevated plinth to simulate lift chair    PLAN: PT FREQUENCY: 2x/week  PT DURATION: 12 weeks  PLANNED INTERVENTIONS: Therapeutic exercises, Therapeutic activity, Neuromuscular re-education, Balance training, Gait training,  Patient/Family education, Self Care, Joint mobilization, Stair training, DME instructions, and Electrical stimulation  PLAN FOR NEXT SESSION: Nustep, standing in // bars as able, LE strength, supine strengthening potentially    Particia Lather PT  12/22/2022, 11:37 AM

## 2022-12-23 ENCOUNTER — Encounter: Payer: Self-pay | Admitting: Physical Therapy

## 2022-12-24 ENCOUNTER — Ambulatory Visit: Payer: BC Managed Care – PPO | Attending: Family Medicine | Admitting: Physical Therapy

## 2022-12-24 ENCOUNTER — Encounter: Payer: Self-pay | Admitting: Physical Therapy

## 2022-12-24 DIAGNOSIS — R2689 Other abnormalities of gait and mobility: Secondary | ICD-10-CM | POA: Insufficient documentation

## 2022-12-24 DIAGNOSIS — R262 Difficulty in walking, not elsewhere classified: Secondary | ICD-10-CM | POA: Insufficient documentation

## 2022-12-24 DIAGNOSIS — M6281 Muscle weakness (generalized): Secondary | ICD-10-CM | POA: Diagnosis not present

## 2022-12-24 NOTE — Therapy (Signed)
OUTPATIENT PHYSICAL THERAPY LOWER EXTREMITY TREATMENT/ RECERT    Patient Name: Gabriella Marsh MRN: [id number] DOB:02-27-1966, 57 y.o., female Today's Date: 12/24/2022   PT End of Session - 12/24/22 1343     Visit Number 20    Number of Visits 24    Date for PT Re-Evaluation 03/16/23    Authorization Type [employer] Other    Authorization Time Period 09/28/22-12/21/22    Progress Note Due on Visit 30    PT Start Time 1345    PT Stop Time 1428    PT Time Calculation (min) 43 min    Equipment Utilized During Treatment Gait belt    Activity Tolerance Patient tolerated treatment well;No increased pain    Behavior During Therapy WFL for tasks assessed/performed                        Past Medical History:  Diagnosis Date   Arthritis    knees   Blood clotting disorder ([place])    on toe    Degenerative disc disease, lumbar    Diabetes mellitus without complication (HCC)    GERD (gastroesophageal reflux disease)    Headache    daily - AM (has not been able to have SPG blocks lately)   Hyperlipidemia    Hypertension    Neuropathy    feet   Vertigo    3-4x/yr   Past Surgical History:  Procedure Laterality Date   ABDOMINAL HYSTERECTOMY     But still has cervix   AMPUTATION TOE Right 07/24/2021   Procedure: AMPUTATION TOE;  Surgeon: Sharlotte Alamo, DPM;  Location: [facility] ORS;  Service: Podiatry;  Laterality: Right;   CESAREAN SECTION     CHOLECYSTECTOMY     COLONOSCOPY WITH PROPOFOL N/A 02/20/2022   Procedure: COLONOSCOPY WITH PROPOFOL;  Surgeon: [name], MD;  Location: [facility] END[facility]COPY;  Service: Gastroenterology;  Laterality: N/A;   left arm frature  10/12/2020   OVARIAN CYST SURGERY     PARATHYROIDECTOMY  03/28/2021   Procedure: PARATHYROIDECTOMY AUTOTRANSPLANT;  Surgeon: Fredirick Maudlin, MD;  Location: [facility] ORS;  Service: General;;   right femur surgery Right    SHOULDER SURGERY Left 12/26/014   Dr. Little Ishikawa, [place] N/A 03/28/2021    Procedure: THYROIDECTOMY, total;  Surgeon: Fredirick Maudlin, MD;  Location: [facility] ORS;  Service: General;  Laterality: N/A;  Provider requesting 3 hours /180 minutes for procedure   TONSILLECTOMY AND ADENOIDECTOMY     UTERINE FIBROID SURGERY     Patient Active Problem List   Diagnosis Date Noted   FTT (failure to thrive) in adult 12/09/2022   Asthenia 12/09/2022   Need for influenza vaccination 09/10/2022   Primary insomnia 09/10/2022   Impaired functional mobility, balance, gait, and endurance 06/09/2022   Type 2 diabetes mellitus with diabetic neuropathy, without long-term current use of insulin ([place]) 04/24/2022   Acquired hypothyroidism 04/24/2022   Hyperlipidemia associated with type 2 diabetes mellitus ([place]) 04/24/2022   Fall 04/24/2022   Dizziness 04/24/2022   Iron deficiency anemia 04/24/2022   Colon cancer screening 01/26/2022   Encounter for screening mammogram for malignant neoplasm of breast 01/26/2022   Chronic sensorimotor polyneuropathy with axonal and demyelinating features 01/12/2022   Osteoarthritis of knees (Bilateral) 01/12/2022   Tricompartment osteoarthritis of knees (Bilateral) 01/12/2022    Grade 1 Retrolisthesis (71m) of L5 over S1 01/12/2022   History of proximal humerus fracture (Left) 01/12/2022   Vitamin D deficiency 01/12/2022   Elevated hemoglobin A1c  01/12/2022   Hypochloremia 01/12/2022   Hyperglycemia 01/12/2022   Chronic peripheral neuropathic pain 01/12/2022   Type 2 diabetes mellitus with peripheral neuropathy ([place]) 01/12/2022   Glands swollen 01/09/2022   Acute cough 01/09/2022   Acute non-recurrent pansinusitis 01/09/2022   Rhinorrhea 01/09/2022   Left acute otitis media 01/09/2022   Pharmacologic therapy 11/12/2021   Disorder of skeletal system 11/12/2021   Problems influencing health status 11/12/2021   Chronic hand pain (1ry area of Pain) (Bilateral) (L>R) 11/12/2021   Chronic feet pain (3ry area of Pain) (Bilateral) (L>R) 11/12/2021    Chronic shoulder pain (4th area of Pain) (Left) 11/12/2021   Chronic knee pain (5th area of Pain) (Bilateral) (L>R) 11/12/2021   Lumbar facet syndrome (Bilateral) 11/12/2021   Muscle spasm 10/22/2021   Type 2 diabetes mellitus with microalbuminuria, without long-term current use of insulin ([place]) 10/22/2021   Diabetic foot infection ([place]) 07/22/2021   Depression with anxiety 07/22/2021   Sepsis ([place]) 07/22/2021   COVID-19 virus infection 07/22/2021   Hypocalcemia 07/17/2021   Chronic pain syndrome 07/17/2021   S/P total thyroidectomy 03/28/2021   Thyroid nodule    Schamberg's purpura 10/09/2019   Leg swelling 10/09/2019   Benign positional vertigo 02/03/2016   Common migraine with intractable migraine 06/18/2015   Anxiety 05/04/2015   Adaptation reaction 05/03/2015   DDD (degenerative disc disease), lumbosacral 05/03/2015   Hypercholesteremia 05/03/2015   Insomnia due to medical condition 05/03/2015   Adiposity 05/03/2015   Disorder of peripheral nervous system 05/03/2015   Snores 05/03/2015   Diabetic peripheral neuropathy ([place]) 03/06/2015   Essential hypertension 03/06/2015   Elevated liver function tests 03/06/2015   Obesity 03/06/2015   GERD (gastroesophageal reflux disease) 03/06/2015   Multinodular goiter 03/06/2015   RLS (restless legs syndrome) 03/06/2015   Chronic low back pain (2ry area of Pain) (Bilateral) (L>R) w/o sciatica 03/06/2015   Leg cramps 03/06/2015    PCP: [name], FNP  REFERRING PROVIDER: [name], FNP  REFERRING DIAG: [phone] (ICD-10-CM) - Fall, subsequent encounter  THERAPY DIAG:  Other abnormalities of gait and mobility  Difficulty in walking, not elsewhere classified  Muscle weakness (generalized)  Rationale for Evaluation and Treatment Rehabilitation  ONSET DATE: 06/18/22  SUBJECTIVE:   SUBJECTIVE STATEMENT: Pt reports she is doing well today with no significant changes since last session. Pt educated regarding new  insurance and visit limitations.  Patient educated we will be going down to 1 time a week in order to conserve visits and we will try to stick with later appointment times her caregiver can be present for better translation to participating in these activities at home.  Patient verbalizes understanding.  PERTINENT HISTORY: Pt reports her fall initially occurred in the evening when her husband was not home. Pt got up and had her Rolator and she walked toward her hallway. When she turned around to turn the light on she went completely down on her R leg and broke her femur distally and twisted her knee. When EMT arrived she had to straighten her LE to immobilize it. Pt went to [facility] hospital for the trauma.   Upon arrival she was evaluated and had to get MRI and imaging. Progressed to 06/19/22. Pt had surgery that night. Pt had to have plate and screws in her knee and rod in her femur. Pt was in hospital for 11 days then progressed to [facility] for 30 days. Pt had rough time at [facility].   Upon coming home  with [facility] pt came home with catheter. Pt came home and was relieved. Pt husband helps with everything doing "everything" for her except she helps with cooking.   Pt considered home health physical therapy but the cost of care would be too high ( $125 per visit) and this.   Prior to injury pt was ambulating with rolator. Pt used bedside commode at that time. Pt reports 6 falls during this year. Pt reports 2-3 falls with rolator and 2-3 falls walking between bathroom and bedroom and 1 fall in bedroom.   Pt had another fall at [place] where she broke her left humerus about 2 years ago.   PAIN:  Are you having pain? No  PRECAUTIONS: Other: WBAT  WEIGHT BEARING RESTRICTIONS     Gardenia Phlegm, MD MPH - 09/03/2022 3:15 PM EDT   Formatting of this note might be different from the original.  At this time, you can continue weightbearing as tolerated to right lower  extremity. Please continue working on range of motion and strengthening. We have given you prescription for physical therapy.   FALLS:  Has patient fallen in last 6 months? Yes. 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 Pt does not know if she will be able to sit in one position for that long. Not sure if it will be feasible to get back to work.   PLOF: Requires assistive device for independence, Needs assistance with ADLs, Needs assistance with homemaking, and Needs assistance with gait  PATIENT GOALS Pt would like to get legs moving a little bit more. Pt wants to be able to get to bathroom on her own.  Get upper body strength improved.      TODAY'S TREATMENT: 12/24/22     STS with 2 x Max A for transition then Min A in standing to maintain balance -Pt able to walk 4 feet in // bars with heavy UE assist and 2 x mod A for balance and stability, not a safe activity for home without skilled PT for assistance. Cues for heel lift prior to step. Pt takes step with R LE then uses step to with L LE then repeats for ambulatory bout.  Seated rest  Transferred via slide board to up and down large mat table and then raised mat table to height approximately to that of which patient has a lift chair at home.  Then physical therapist positioned standard walker in front of patient then patient and 2 times physical therapist transitions to standing with min to mod assist standing for approximately 30 seconds completing 5 reps.  Towards rep 3 patient able to stand with min assist but with repetitions 4 and 5 patient requires more min to mod assist  in order to get to standing position.  On last repetition patient encouraged to stand as long as she is able to was able to stand for 3 minutes with standard walker.  Patient also able to bear weight or weight through her lower extremities as evidenced by utilizing very light grip and finger tip assistance on walker on the right upper extremity.   LAQ 2 x 10 on R  ann 10 AAROM on L ,  4# on R and AAROM on L with assistance lifting the thigh for foot clearance, no resistance  Seated hip flexion x 10 reps and 2 sets on each lower extremity with 3# AW on the R   Unless otherwise stated, CGA was provided and gait belt donned in order to ensure pt safety       PATIENT EDUCATION:  Education details: HEP, POC Person educated: Patient Education method: Explanation Education comprehension: verbalized understanding   HOME EXERCISE PROGRAM: Access Code: [email or web address] URL: [email or web address] Date: 09/28/2022 Prepared by: Rivka Barbara  Exercises - Seated March  - 1 x daily - 7 x weekly - 2 sets - 10 reps - Seated Long Arc Quad  - 1 x daily - 7 x weekly - 2 sets - 10 reps - Seated Heel Raise  - 1 x daily - 7 x weekly - 2 sets - 20 reps - Seated hip abduction with band  - 1 x daily - 7 x weekly - 2 sets - 10 reps - Seated Hip Adduction Isometrics with Ball  - 1 x daily - 7 x weekly - 2 sets - 10 reps  ASSESSMENT:  CLINICAL IMPRESSION:  Patient demonstrates good progress with therapeutic interventions this session.  Patient able to continue to progress with taking steps in parallel bar although patient still needs significant assistance this is not yet a safe activity for her to complete at home.  Patient progresses with sit to stands from elevated mat table with good overall results requiring less assistance with physical therapist.  Physical therapist would like to introduce this to patient's caregiver so it can be completed safely within the home.  Caregiver will require  significant education for safety with this activity within the home.  Patient making good progress at this time will continue to benefit from skilled physical therapy to improve her lower extremity strength, function, quality of life.   OBJECTIVE IMPAIRMENTS Abnormal gait, decreased activity tolerance, decreased balance, decreased endurance, decreased mobility, difficulty walking, decreased ROM, decreased strength, impaired perceived functional ability, and impaired sensation.   ACTIVITY LIMITATIONS carrying, lifting, bending, standing, squatting, stairs, transfers, bed mobility, bathing, toileting, dressing, self feeding, hygiene/grooming, and locomotion level  PARTICIPATION LIMITATIONS: meal prep, cleaning, laundry, driving, shopping, community activity, occupation, and yard work  PERSONAL FACTORS 3+ comorbidities: Polyneuropathy, hypertension, hyperlipidemia, diabetes mellitus, arthritis  are also affecting patient's functional outcome.   REHAB POTENTIAL: Fair secondary to comorbidities above particularly polyneuropathy affecting her lower extremity strength and muscle activation  CLINICAL DECISION MAKING: Evolving/moderate complexity  EVALUATION COMPLEXITY: Moderate   GOALS: Goals reviewed with patient? Yes  SHORT TERM GOALS: Target date: 10/26/2022     Patient will be independent in home exercise program to improve strength/mobility for better functional independence with ADLs. Baseline: No HEP currently 11/15: Completing Hep regularly.  Goal status: Met    LONG TERM GOALS: Target date: 03/16/2023    Patient will be modified independent for slide board transfers in order to improve her ability to transfer to and from wheelchair as well as bedside commode for improvement in ADLs. Baseline: Patient currently requires assistance with slide board transfers is unable to utilize bedside commode and requires use of catheter and bedpan 11/15: Pt completing slide board transfers and only  requires assistance with placing slide board and maneuvering WC 1/3: Patient able to demonstrate modified independent slide board transfer from mat table to wheelchair demonstrating ability to place slide board appropriately and perform transfer with  only assistance needed for chair positioning prior to transfer Goal status: MET  2.  Patient will perform sit to and from stand with min assist from physical therapist in order to indicate improved lower extremity functional strength and improved ability to perform functional transfers and functional leg activities within her home. Baseline: Patient unable to stand independently at this time due to lower extremity weakness 11/04/2022: Patient is able to perform anterior lean and pull on parallel bars and activate gluteal muscles was unable to lift weight from wheelchair Goal status: IN PROGRESS  3.  Patient will improve focus on therapeutic outcome score to 35 or greater in order to indicate improved perceived functional ability Baseline: 19 11/15: 40 12/22/22: 43 Goal status: MET  4.  Patient will perform stand pivot transfer from wheelchair to mat table with moderate assistance or less in order to improve her ability to perform transfers within the home to improve her overall functional capacity. Baseline: Unable to complete 11/04/2022: Patient is able to perform anterior lean and pull on parallel bars and activate gluteal muscles was unable to lift weight from wheelchair 12/22/22: Able to stand in // bars with B UE support and with 2 x Mod-Max A from PT and with PT stabilizing LE at knee.  Goal status: IN PROGRESS  4.  Pt will improve LEFS by 10 points or more in order to indicate improved LE function and mobility.  Baseline: 9 : 12/22/22: 26 Goal status: MET  5.  Patient will demonstrate ability to stand from elevated mat table with x 1 mod to max assist at rolling walker in order to show progress with stand pivot transfer with caregiver to bedside  commode.  Baseline: 1/3: able to stand in // bars with x 2 mod-max A consistently Goal status: INITIAL   6.  Patient will improve LEFS score by 10 points or more in order to indicate improved lower extremity function and mobility  Baseline: 12/23/22: 26  Goal status: INITIAL    PLAN: PT FREQUENCY: 2x/week  PT DURATION: 12 weeks  PLANNED INTERVENTIONS: Therapeutic exercises, Therapeutic activity, Neuromuscular re-education, Balance training, Gait training, Patient/Family education, Self Care, Joint mobilization, Stair training, DME instructions, and Electrical stimulation  PLAN FOR NEXT SESSION: Nustep, standing in // bars as able, LE strength, supine strengthening potentially, sit to stand transfers from elevated mat table/ plinth using RW and instructing husband in assistance techniques when able ( when he is able to come to appointments, he works during some appointment dates/ times)    Particia Lather PT  12/24/2022, 3:01 PM

## 2022-12-26 ENCOUNTER — Encounter: Payer: Self-pay | Admitting: Family Medicine

## 2022-12-26 DIAGNOSIS — M62838 Other muscle spasm: Secondary | ICD-10-CM

## 2022-12-26 DIAGNOSIS — K219 Gastro-esophageal reflux disease without esophagitis: Secondary | ICD-10-CM

## 2022-12-28 ENCOUNTER — Ambulatory Visit: Payer: BC Managed Care – PPO | Admitting: Physical Therapy

## 2022-12-28 DIAGNOSIS — R2689 Other abnormalities of gait and mobility: Secondary | ICD-10-CM

## 2022-12-28 DIAGNOSIS — M6281 Muscle weakness (generalized): Secondary | ICD-10-CM

## 2022-12-28 DIAGNOSIS — R262 Difficulty in walking, not elsewhere classified: Secondary | ICD-10-CM | POA: Diagnosis not present

## 2022-12-28 NOTE — Therapy (Unsigned)
OUTPATIENT PHYSICAL THERAPY LOWER EXTREMITY TREATMENT    Patient Name: Gabriella Marsh MRN: [id number] DOB:1966/11/05, 57 y.o., female Today's Date: 12/29/2022   PT End of Session - 12/28/22 1513     Visit Number 21    Number of Visits 42    Date for PT Re-Evaluation 03/16/23    Authorization Type BCBS Other    Authorization Time Period 09/28/22-12/21/22    Progress Note Due on Visit 30    PT Start Time 1515    PT Stop Time 1558    PT Time Calculation (min) 43 min    Equipment Utilized During Treatment Gait belt    Activity Tolerance Patient tolerated treatment well;No increased pain    Behavior During Therapy WFL for tasks assessed/performed                         Past Medical History:  Diagnosis Date   Arthritis    knees   Blood clotting disorder ([place])    on toe    Degenerative disc disease, lumbar    Diabetes mellitus without complication (HCC)    GERD (gastroesophageal reflux disease)    Headache    daily - AM (has not been able to have SPG blocks lately)   Hyperlipidemia    Hypertension    Neuropathy    feet   Vertigo    3-4x/yr   Past Surgical History:  Procedure Laterality Date   ABDOMINAL HYSTERECTOMY     But still has cervix   AMPUTATION TOE Right 07/24/2021   Procedure: AMPUTATION TOE;  Surgeon: Sharlotte Alamo, DPM;  Location: [facility] ORS;  Service: Podiatry;  Laterality: Right;   CESAREAN SECTION     CHOLECYSTECTOMY     COLONOSCOPY WITH PROPOFOL N/A 02/20/2022   Procedure: COLONOSCOPY WITH PROPOFOL;  Surgeon: [name], MD;  Location: [facility] ENDOSCOPY;  Service: Gastroenterology;  Laterality: N/A;   left arm frature  10/12/2020   OVARIAN CYST SURGERY     PARATHYROIDECTOMY  03/28/2021   Procedure: PARATHYROIDECTOMY AUTOTRANSPLANT;  Surgeon: Fredirick Maudlin, MD;  Location: [facility] ORS;  Service: General;;   right femur surgery Right    SHOULDER SURGERY Left 12/26/014   Dr. Little Ishikawa, [place] N/A 03/28/2021   Procedure:  THYROIDECTOMY, total;  Surgeon: Fredirick Maudlin, MD;  Location: [facility] ORS;  Service: General;  Laterality: N/A;  Provider requesting 3 hours /180 minutes for procedure   TONSILLECTOMY AND ADENOIDECTOMY     UTERINE FIBROID SURGERY     Patient Active Problem List   Diagnosis Date Noted   FTT (failure to thrive) in adult 12/09/2022   Asthenia 12/09/2022   Need for influenza vaccination 09/10/2022   Primary insomnia 09/10/2022   Impaired functional mobility, balance, gait, and endurance 06/09/2022   Type 2 diabetes mellitus with diabetic neuropathy, without long-term current use of insulin ([place]) 04/24/2022   Acquired hypothyroidism 04/24/2022   Hyperlipidemia associated with type 2 diabetes mellitus ([place]) 04/24/2022   Fall 04/24/2022   Dizziness 04/24/2022   Iron deficiency anemia 04/24/2022   Colon cancer screening 01/26/2022   Encounter for screening mammogram for malignant neoplasm of breast 01/26/2022   Chronic sensorimotor polyneuropathy with axonal and demyelinating features 01/12/2022   Osteoarthritis of knees (Bilateral) 01/12/2022   Tricompartment osteoarthritis of knees (Bilateral) 01/12/2022    Grade 1 Retrolisthesis (63m) of L5 over S1 01/12/2022   History of proximal humerus fracture (Left) 01/12/2022   Vitamin D deficiency 01/12/2022   Elevated hemoglobin A1c  01/12/2022   Hypochloremia 01/12/2022   Hyperglycemia 01/12/2022   Chronic peripheral neuropathic pain 01/12/2022   Type 2 diabetes mellitus with peripheral neuropathy ([place]) 01/12/2022   Glands swollen 01/09/2022   Acute cough 01/09/2022   Acute non-recurrent pansinusitis 01/09/2022   Rhinorrhea 01/09/2022   Left acute otitis media 01/09/2022   Pharmacologic therapy 11/12/2021   Disorder of skeletal system 11/12/2021   Problems influencing health status 11/12/2021   Chronic hand pain (1ry area of Pain) (Bilateral) (L>R) 11/12/2021   Chronic feet pain (3ry area of Pain) (Bilateral) (L>R) 11/12/2021   Chronic  shoulder pain (4th area of Pain) (Left) 11/12/2021   Chronic knee pain (5th area of Pain) (Bilateral) (L>R) 11/12/2021   Lumbar facet syndrome (Bilateral) 11/12/2021   Muscle spasm 10/22/2021   Type 2 diabetes mellitus with microalbuminuria, without long-term current use of insulin ([place]) 10/22/2021   Diabetic foot infection ([place]) 07/22/2021   Depression with anxiety 07/22/2021   Sepsis ([place]) 07/22/2021   COVID-19 virus infection 07/22/2021   Hypocalcemia 07/17/2021   Chronic pain syndrome 07/17/2021   S/P total thyroidectomy 03/28/2021   Thyroid nodule    Schamberg's purpura 10/09/2019   Leg swelling 10/09/2019   Benign positional vertigo 02/03/2016   Common migraine with intractable migraine 06/18/2015   Anxiety 05/04/2015   Adaptation reaction 05/03/2015   DDD (degenerative disc disease), lumbosacral 05/03/2015   Hypercholesteremia 05/03/2015   Insomnia due to medical condition 05/03/2015   Adiposity 05/03/2015   Disorder of peripheral nervous system 05/03/2015   Snores 05/03/2015   Diabetic peripheral neuropathy ([place]) 03/06/2015   Essential hypertension 03/06/2015   Elevated liver function tests 03/06/2015   Obesity 03/06/2015   GERD (gastroesophageal reflux disease) 03/06/2015   Multinodular goiter 03/06/2015   RLS (restless legs syndrome) 03/06/2015   Chronic low back pain (2ry area of Pain) (Bilateral) (L>R) w/o sciatica 03/06/2015   Leg cramps 03/06/2015    PCP: [name], FNP  REFERRING PROVIDER: [name], FNP  REFERRING DIAG: [phone] (ICD-10-CM) - Fall, subsequent encounter  THERAPY DIAG:  Difficulty in walking, not elsewhere classified  Other abnormalities of gait and mobility  Muscle weakness (generalized)  Rationale for Evaluation and Treatment Rehabilitation  ONSET DATE: 06/18/22  SUBJECTIVE:   SUBJECTIVE STATEMENT: Pt reports feeling weak this date. She thinks it could be from having a lazy weekend but she still did her exercises.    PERTINENT HISTORY: Pt reports her fall initially occurred in the evening when her husband was not home. Pt got up and had her Rolator and she walked toward her hallway. When she turned around to turn the light on she went completely down on her R leg and broke her femur distally and twisted her knee. When EMT arrived she had to straighten her LE to immobilize it. Pt went to [facility] for the trauma.   Upon arrival she was evaluated and had to get MRI and imaging. Progressed to 06/19/22. Pt had surgery that night. Pt had to have plate and screws in her knee and rod in her femur. Pt was in hospital for 11 days then progressed to [facility] days. [facility] time at [facility]   Upon coming home with [facility] pt came home with catheter. Pt came home and was relieved. Pt husband helps with everything doing "everything" for her except she helps with cooking.   Pt considered home health physical therapy but the cost of care would be too high ( $  125 per visit) and this.   Prior to injury pt was ambulating with rolator. Pt used bedside commode at that time. Pt reports 6 falls during this year. Pt reports 2-3 falls with rolator and 2-3 falls walking between bathroom and bedroom and 1 fall in bedroom.   Pt had another fall at [place] where she broke her left humerus about 2 years ago.   PAIN:  Are you having pain? No  PRECAUTIONS: Other: WBAT  WEIGHT BEARING RESTRICTIONS     Gardenia Phlegm, MD MPH - 09/03/2022 3:15 PM EDT   Formatting of this note might be different from the original.  At this time, you can continue weightbearing as tolerated to right lower extremity. Please continue working on range of motion and strengthening. We have given you prescription for physical therapy.   FALLS:  Has patient fallen in last 6 months? Yes. 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 Pt does not know if she will be able to sit in one position for that long. Not sure if it will be feasible to get back to work.   PLOF: Requires assistive device for independence, Needs assistance with ADLs, Needs assistance with homemaking, and Needs assistance with gait  PATIENT GOALS Pt would like to get legs moving a little bit more. Pt wants to be able to get to bathroom on her own.  Get upper body strength improved.      TODAY'S TREATMENT: 12/29/22   Level 1 on Nustep x 6 minutes with L hip abduction stabilizer donned and R LE braced with theraband to keep foot in place.  -transferred to/from  with slide board with mod A from pt getting to the nustep (higher surface than WC and set up assistance from PT going from nustep to [place]. -LE only, 6 min LE and UE    Transferred via slide board to up and down large mat table and then raised mat table to height approximately to that of which patient has a lift chair at home.  Then physical therapist positioned standard walker in front of patient then patient and 2 physical therapist transitions to standing with min to mod assist standing for approximately 30 seconds completing 1 reps.  Following this one PT was replaced by pt caregiver on her R ( and weaker) side to work on assisting pt with standing interventions. Caregiver educated regarding foot position for pt and the caregiver.  Caregiver does well but requires cues for proper positioning and hand placement for safety. Pt encouraged to complete 10 heel lifts on the R LE on each standing repetition to work on weight shifting. On last repetition patient encouraged to stand as long as she is able to was able to  stand for 2 minutes with standard walker.  Patient also able to bear weight or weight through her lower extremities as evidenced by utilizing very light grip and finger tip assistance on walker on the right upper extremity.  Seated PF/ DF on wobble board 2 x 20 ea to demonstrate to Pt caregiver device.   Pt transferred to [facility] via slide board and set up assistance.   Unless  otherwise stated, CGA was provided and gait belt donned in order to ensure pt safety       PATIENT EDUCATION:  Education details: HEP, POC Person educated: Patient Education method: Explanation Education comprehension: verbalized understanding   HOME EXERCISE PROGRAM: Access Code: [email or web address] URL: [email or web address] Date: 09/28/2022 Prepared by: Rivka Barbara  Exercises - Seated March  - 1 x daily - 7 x weekly - 2 sets - 10 reps - Seated Long Arc Quad  - 1 x daily - 7 x weekly - 2 sets - 10 reps - Seated Heel Raise  - 1 x daily - 7 x weekly - 2 sets - 20 reps - Seated hip abduction with band  - 1 x daily - 7 x weekly - 2 sets - 10 reps - Seated Hip Adduction Isometrics with Ball  - 1 x daily - 7 x weekly - 2 sets - 10 reps  ASSESSMENT:  CLINICAL IMPRESSION:  Patient demonstrates good progress with therapeutic interventions this session.    Patient progresses with sit to stands from elevated mat table with good overall results requiring less assistance with physical therapist. PT began to educate pt caregiver/husband regarding safe guarding techniques and Husband able to help with standing activities on a couple attempts this date with good results. Will continue education in future sessions so caregiver can play a bigger role in STS assistance with guidance of PT. Patient making good progress at this time will continue to benefit from skilled physical therapy to improve her lower extremity strength, function, quality of life.   OBJECTIVE IMPAIRMENTS Abnormal gait, decreased activity  tolerance, decreased balance, decreased endurance, decreased mobility, difficulty walking, decreased ROM, decreased strength, impaired perceived functional ability, and impaired sensation.   ACTIVITY LIMITATIONS carrying, lifting, bending, standing, squatting, stairs, transfers, bed mobility, bathing, toileting, dressing, self feeding, hygiene/grooming, and locomotion level  PARTICIPATION LIMITATIONS: meal prep, cleaning, laundry, driving, shopping, community activity, occupation, and yard work  PERSONAL FACTORS 3+ comorbidities: Polyneuropathy, hypertension, hyperlipidemia, diabetes mellitus, arthritis  are also affecting patient's functional outcome.   REHAB POTENTIAL: Fair secondary to comorbidities above particularly polyneuropathy affecting her lower extremity strength and muscle activation  CLINICAL DECISION MAKING: Evolving/moderate complexity  EVALUATION COMPLEXITY: Moderate   GOALS: Goals reviewed with patient? Yes  SHORT TERM GOALS: Target date: 10/26/2022     Patient will be independent in home exercise program to improve strength/mobility for better functional independence with ADLs. Baseline: No HEP currently 11/15: Completing Hep regularly.  Goal status: Met    LONG TERM GOALS: Target date: 03/16/2023    Patient will be modified independent for slide board transfers in order to improve her ability to transfer to and from wheelchair as well as bedside commode for improvement in ADLs. Baseline: Patient currently requires assistance with slide board transfers is unable to utilize bedside commode and requires use of catheter and bedpan 11/15: Pt completing slide board transfers and only requires assistance with placing slide board and maneuvering WC 1/3: Patient able to demonstrate modified independent slide board transfer from mat table to wheelchair demonstrating ability to place slide board appropriately and perform transfer with only assistance needed for chair positioning  prior to transfer Goal status: MET  2.  Patient will perform sit to and from stand with min assist from physical therapist in order to indicate improved lower extremity functional strength and improved ability to perform functional transfers and functional leg activities within her home. Baseline: Patient unable to stand independently at this time due to lower extremity  weakness 11/04/2022: Patient is able to perform anterior lean and pull on parallel bars and activate gluteal muscles was unable to lift weight from wheelchair Goal status: IN PROGRESS  3.  Patient will improve focus on therapeutic outcome score to 35 or greater in order to indicate improved perceived functional ability Baseline: 19 11/15: 40 12/22/22: 43 Goal status: MET  4.  Patient will perform stand pivot transfer from wheelchair to mat table with moderate assistance or less in order to improve her ability to perform transfers within the home to improve her overall functional capacity. Baseline: Unable to complete 11/04/2022: Patient is able to perform anterior lean and pull on parallel bars and activate gluteal muscles was unable to lift weight from wheelchair 12/22/22: Able to stand in // bars with B UE support and with 2 x Mod-Max A from PT and with PT stabilizing LE at knee.  Goal status: IN PROGRESS  4.  Pt will improve LEFS by 10 points or more in order to indicate improved LE function and mobility.  Baseline: 9 : 12/22/22: 26 Goal status: MET  5.  Patient will demonstrate ability to stand from elevated mat table with x 1 mod to max assist at rolling walker in order to show progress with stand pivot transfer with caregiver to bedside commode.  Baseline: 1/3: able to stand in // bars with x 2 mod-max A consistently Goal status: INITIAL   6.  Patient will improve LEFS score by 10 points or more in order to indicate improved lower extremity function and mobility  Baseline: 12/23/22: 26  Goal status: INITIAL    PLAN: PT  FREQUENCY: 2x/week  PT DURATION: 12 weeks  PLANNED INTERVENTIONS: Therapeutic exercises, Therapeutic activity, Neuromuscular re-education, Balance training, Gait training, Patient/Family education, Self Care, Joint mobilization, Stair training, DME instructions, and Electrical stimulation  PLAN FOR NEXT SESSION: Nustep, standing in // bars as able, LE strength, supine strengthening potentially, sit to stand transfers from elevated mat table/ plinth using RW and instructing husband in assistance techniques when able ( when he is able to come to appointments, he works during some appointment dates/ times)    Particia Lather PT  12/29/2022, 8:09 AM

## 2022-12-28 NOTE — Therapy (Deleted)
OUTPATIENT PHYSICAL THERAPY LOWER EXTREMITY TREATMENT    Patient Name: Gabriella Marsh MRN: [id number] DOB:June 04, 1966, 57 y.o., female Today's Date: 12/28/2022                Past Medical History:  Diagnosis Date   Arthritis    knees   Blood clotting disorder ([place])    on toe    Degenerative disc disease, lumbar    Diabetes mellitus without complication (HCC)    GERD (gastroesophageal reflux disease)    Headache    daily - AM (has not been able to have SPG blocks lately)   Hyperlipidemia    Hypertension    Neuropathy    feet   Vertigo    3-4x/yr   Past Surgical History:  Procedure Laterality Date   ABDOMINAL HYSTERECTOMY     But still has cervix   AMPUTATION TOE Right 07/24/2021   Procedure: AMPUTATION TOE;  Surgeon: Sharlotte Alamo, DPM;  Location: [facility] ORS;  Service: Podiatry;  Laterality: Right;   CESAREAN SECTION     CHOLECYSTECTOMY     COLONOSCOPY WITH PROPOFOL N/A 02/20/2022   Procedure: COLONOSCOPY WITH PROPOFOL;  Surgeon: [name], MD;  Location: [facility] ENDOSCOPY;  Service: Gastroenterology;  Laterality: N/A;   left arm frature  10/12/2020   OVARIAN CYST SURGERY     PARATHYROIDECTOMY  03/28/2021   Procedure: PARATHYROIDECTOMY AUTOTRANSPLANT;  Surgeon: Fredirick Maudlin, MD;  Location: [facility] ORS;  Service: General;;   right femur surgery Right    SHOULDER SURGERY Left 12/26/014   Dr. Little Ishikawa, [place] N/A 03/28/2021   Procedure: THYROIDECTOMY, total;  Surgeon: Fredirick Maudlin, MD;  Location: [facility] ORS;  Service: General;  Laterality: N/A;  Provider requesting 3 hours /180 minutes for procedure   TONSILLECTOMY AND ADENOIDECTOMY     UTERINE FIBROID SURGERY     Patient Active Problem List   Diagnosis Date Noted   FTT (failure to thrive) in adult 12/09/2022   Asthenia 12/09/2022   Need for influenza vaccination 09/10/2022   Primary insomnia 09/10/2022   Impaired functional mobility, balance, gait, and endurance 06/09/2022   Type 2  diabetes mellitus with diabetic neuropathy, without long-term current use of insulin ([place]) 04/24/2022   Acquired hypothyroidism 04/24/2022   Hyperlipidemia associated with type 2 diabetes mellitus ([place]) 04/24/2022   Fall 04/24/2022   Dizziness 04/24/2022   Iron deficiency anemia 04/24/2022   Colon cancer screening 01/26/2022   Encounter for screening mammogram for malignant neoplasm of breast 01/26/2022   Chronic sensorimotor polyneuropathy with axonal and demyelinating features 01/12/2022   Osteoarthritis of knees (Bilateral) 01/12/2022   Tricompartment osteoarthritis of knees (Bilateral) 01/12/2022    Grade 1 Retrolisthesis (8m) of L5 over S1 01/12/2022   History of proximal humerus fracture (Left) 01/12/2022   Vitamin D deficiency 01/12/2022   Elevated hemoglobin A1c 01/12/2022   Hypochloremia 01/12/2022   Hyperglycemia 01/12/2022   Chronic peripheral neuropathic pain 01/12/2022   Type 2 diabetes mellitus with peripheral neuropathy ([place] 01/12/2022   Glands swollen 01/09/2022   Acute cough 01/09/2022   Acute non-recurrent pansinusitis 01/09/2022   Rhinorrhea 01/09/2022   Left acute otitis media 01/09/2022   Pharmacologic therapy 11/12/2021   Disorder of skeletal system 11/12/2021   Problems influencing health status 11/12/2021   Chronic hand pain (1ry area of Pain) (Bilateral) (L>R) 11/12/2021   Chronic feet pain (3ry area of Pain) (Bilateral) (L>R) 11/12/2021   Chronic shoulder pain (4th area of Pain) (Left) 11/12/2021   Chronic knee pain (5th area of  Pain) (Bilateral) (L>R) 11/12/2021   Lumbar facet syndrome (Bilateral) 11/12/2021   Muscle spasm 10/22/2021   Type 2 diabetes mellitus with microalbuminuria, without long-term current use of insulin ([place]) 10/22/2021   Diabetic foot infection ([place]) 07/22/2021   Depression with anxiety 07/22/2021   Sepsis ([place]) 07/22/2021   COVID-19 virus infection 07/22/2021   Hypocalcemia 07/17/2021   Chronic pain syndrome 07/17/2021   S/P  total thyroidectomy 03/28/2021   Thyroid nodule    Schamberg's purpura 10/09/2019   Leg swelling 10/09/2019   Benign positional vertigo 02/03/2016   Common migraine with intractable migraine 06/18/2015   Anxiety 05/04/2015   Adaptation reaction 05/03/2015   DDD (degenerative disc disease), lumbosacral 05/03/2015   Hypercholesteremia 05/03/2015   Insomnia due to medical condition 05/03/2015   Adiposity 05/03/2015   Disorder of peripheral nervous system 05/03/2015   Snores 05/03/2015   Diabetic peripheral neuropathy ([place]) 03/06/2015   Essential hypertension 03/06/2015   Elevated liver function tests 03/06/2015   Obesity 03/06/2015   GERD (gastroesophageal reflux disease) 03/06/2015   Multinodular goiter 03/06/2015   RLS (restless legs syndrome) 03/06/2015   Chronic low back pain (2ry area of Pain) (Bilateral) (L>R) w/o sciatica 03/06/2015   Leg cramps 03/06/2015    PCP: [name], FNP  REFERRING PROVIDER: [name], FNP  REFERRING DIAG: [phone] (ICD-10-CM) - Fall, subsequent encounter  THERAPY DIAG:  No diagnosis found.  Rationale for Evaluation and Treatment Rehabilitation  ONSET DATE: 06/18/22  SUBJECTIVE:   SUBJECTIVE STATEMENT: Pt reports she is doing well today with no significant changes since last session. Pt educated regarding new insurance and visit limitations.  Patient educated we will be going down to 1 time a week in order to conserve visits and we will try to stick with later appointment times her caregiver can be present for better translation to participating in these activities at home.  Patient verbalizes understanding.  PERTINENT HISTORY: Pt reports her fall initially occurred in the evening when her husband was not home. Pt got up and had her Rolator and she walked toward her hallway. When she turned around to turn the light on she went completely down on her R leg and broke her femur distally and twisted her knee. When EMT arrived she had to  straighten her LE to immobilize it. Pt went to [facility] hospital for the trauma.   Upon arrival she was evaluated and had to get MRI and imaging. Progressed to 06/19/22. Pt had surgery that night. Pt had to have plate and screws in her knee and rod in her femur. Pt was in hospital for 11 days then progressed to [facility] days. [facility].   Upon coming home with [facility] pt came home with catheter. Pt came home and was relieved. Pt husband helps with everything doing "everything" for her except she helps with cooking.   Pt considered home health physical therapy but the cost of care would be too high ( $125 per visit) and this.   Prior to injury pt was ambulating with rolator. Pt used bedside commode at that time. Pt reports 6 falls during this year. Pt reports 2-3 falls with rolator and 2-3 falls walking between bathroom and bedroom and 1 fall in bedroom.   Pt had another fall at [facility] where she broke her left humerus about 2 years ago.   PAIN:  Are you having pain? No  PRECAUTIONS: Other: WBAT  WEIGHT BEARING  RESTRICTIONS     Gardenia Phlegm, MD MPH - 09/03/2022 3:15 PM EDT   Formatting of this note might be different from the original.  At this time, you can continue weightbearing as tolerated to right lower extremity. Please continue working on range of motion and strengthening. We have given you prescription for physical therapy.   FALLS:  Has patient fallen in last 6 months? Yes. 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 Pt does not know if she will be able to sit in one  position for that long. Not sure if it will be feasible to get back to work.   PLOF: Requires assistive device for independence, Needs assistance with ADLs, Needs assistance with homemaking, and Needs assistance with gait  PATIENT GOALS Pt would like to get legs moving a little bit more. Pt wants to be able to get to bathroom on her own.  Get upper body strength improved.      TODAY'S TREATMENT: 12/28/22     STS with 2 x Max A for transition then Min A in standing to maintain balance -Pt able to walk 4 feet in // bars with heavy UE assist and 2 x mod A for balance and stability, not a safe activity for home without skilled PT for assistance. Cues for heel lift prior to step. Pt takes step with R LE then uses step to with L LE then repeats for ambulatory bout.  Seated rest  Transferred via slide board to up and down large mat table and then raised mat table to height approximately to that of which patient has a lift chair at home.  Then physical therapist positioned standard walker in front of patient then patient and 2 times physical therapist transitions to standing with min to mod assist standing for approximately 30 seconds completing 5 reps.  Towards rep 3 patient able to stand with min assist but with repetitions 4 and 5 patient requires more min to mod assist in order to get to standing position.  On last repetition patient encouraged to stand as long as she is able to was able to stand for 3 minutes with standard walker.  Patient also able to bear weight or weight through her lower extremities as evidenced by utilizing very light grip and finger tip assistance on walker on the right upper extremity.   LAQ 2 x 10 on R ann 10 AAROM on L ,  4# on R and AAROM on L with assistance lifting the thigh for foot clearance, no resistance  Seated hip flexion x 10 reps and 2 sets on each lower extremity with 3# AW on the R   Unless otherwise stated, CGA was provided and gait belt donned in order  to ensure pt safety       PATIENT EDUCATION:  Education details: HEP, POC Person educated: Patient Education method: Explanation Education comprehension: verbalized understanding   HOME EXERCISE PROGRAM: Access Code: [email or web address] URL: [email or web address] Date: 09/28/2022 Prepared by: Rivka Barbara  Exercises - Seated March  - 1 x daily - 7 x weekly - 2 sets - 10 reps - Seated Long Arc Quad  - 1 x  daily - 7 x weekly - 2 sets - 10 reps - Seated Heel Raise  - 1 x daily - 7 x weekly - 2 sets - 20 reps - Seated hip abduction with band  - 1 x daily - 7 x weekly - 2 sets - 10 reps - Seated Hip Adduction Isometrics with Ball  - 1 x daily - 7 x weekly - 2 sets - 10 reps  ASSESSMENT:  CLINICAL IMPRESSION:  Patient demonstrates good progress with therapeutic interventions this session.  Patient able to continue to progress with taking steps in parallel bar although patient still needs significant assistance this is not yet a safe activity for her to complete at home.  Patient progresses with sit to stands from elevated mat table with good overall results requiring less assistance with physical therapist.  Physical therapist would like to introduce this to patient's caregiver so it can be completed safely within the home.  Caregiver will require significant education for safety with this activity within the home.  Patient making good progress at this time will continue to benefit from skilled physical therapy to improve her lower extremity strength, function, quality of life.   OBJECTIVE IMPAIRMENTS Abnormal gait, decreased activity tolerance, decreased balance, decreased endurance, decreased mobility, difficulty walking, decreased ROM, decreased strength, impaired perceived functional ability, and impaired sensation.   ACTIVITY LIMITATIONS carrying, lifting, bending, standing, squatting, stairs, transfers, bed mobility, bathing, toileting, dressing, self feeding,  hygiene/grooming, and locomotion level  PARTICIPATION LIMITATIONS: meal prep, cleaning, laundry, driving, shopping, community activity, occupation, and yard work  PERSONAL FACTORS 3+ comorbidities: Polyneuropathy, hypertension, hyperlipidemia, diabetes mellitus, arthritis  are also affecting patient's functional outcome.   REHAB POTENTIAL: Fair secondary to comorbidities above particularly polyneuropathy affecting her lower extremity strength and muscle activation  CLINICAL DECISION MAKING: Evolving/moderate complexity  EVALUATION COMPLEXITY: Moderate   GOALS: Goals reviewed with patient? Yes  SHORT TERM GOALS: Target date: 10/26/2022     Patient will be independent in home exercise program to improve strength/mobility for better functional independence with ADLs. Baseline: No HEP currently 11/15: Completing Hep regularly.  Goal status: Met    LONG TERM GOALS: Target date: 03/16/2023    Patient will be modified independent for slide board transfers in order to improve her ability to transfer to and from wheelchair as well as bedside commode for improvement in ADLs. Baseline: Patient currently requires assistance with slide board transfers is unable to utilize bedside commode and requires use of catheter and bedpan 11/15: Pt completing slide board transfers and only requires assistance with placing slide board and maneuvering WC 1/3: Patient able to demonstrate modified independent slide board transfer from mat table to wheelchair demonstrating ability to place slide board appropriately and perform transfer with only assistance needed for chair positioning prior to transfer Goal status: MET  2.  Patient will perform sit to and from stand with min assist from physical therapist in order to indicate improved lower extremity functional strength and improved ability to perform functional transfers and functional leg activities within her home. Baseline: Patient unable to stand independently  at this time due to lower extremity weakness 11/04/2022: Patient is able to perform anterior lean and pull on parallel bars and activate gluteal muscles was unable to lift weight from wheelchair Goal status: IN PROGRESS  3.  Patient will improve focus on therapeutic outcome score to 35 or greater in order to indicate improved perceived functional ability Baseline: 19 11/15: 40 12/22/22: 43 Goal status: MET  4.  Patient  will perform stand pivot transfer from wheelchair to mat table with moderate assistance or less in order to improve her ability to perform transfers within the home to improve her overall functional capacity. Baseline: Unable to complete 11/04/2022: Patient is able to perform anterior lean and pull on parallel bars and activate gluteal muscles was unable to lift weight from wheelchair 12/22/22: Able to stand in // bars with B UE support and with 2 x Mod-Max A from PT and with PT stabilizing LE at knee.  Goal status: IN PROGRESS  4.  Pt will improve LEFS by 10 points or more in order to indicate improved LE function and mobility.  Baseline: 9 : 12/22/22: 26 Goal status: MET  5.  Patient will demonstrate ability to stand from elevated mat table with x 1 mod to max assist at rolling walker in order to show progress with stand pivot transfer with caregiver to bedside commode.  Baseline: 1/3: able to stand in // bars with x 2 mod-max A consistently Goal status: INITIAL   6.  Patient will improve LEFS score by 10 points or more in order to indicate improved lower extremity function and mobility  Baseline: 12/23/22: 26  Goal status: INITIAL    PLAN: PT FREQUENCY: 2x/week  PT DURATION: 12 weeks  PLANNED INTERVENTIONS: Therapeutic exercises, Therapeutic activity, Neuromuscular re-education, Balance training, Gait training, Patient/Family education, Self Care, Joint mobilization, Stair training, DME instructions, and Electrical stimulation  PLAN FOR NEXT SESSION: Nustep, standing in //  bars as able, LE strength, supine strengthening potentially, sit to stand transfers from elevated mat table/ plinth using RW and instructing husband in assistance techniques when able ( when he is able to come to appointments, he works during some appointment dates/ times)    Particia Lather PT  12/28/2022, 1:56 PM

## 2022-12-29 ENCOUNTER — Encounter: Payer: Self-pay | Admitting: Physical Therapy

## 2022-12-29 ENCOUNTER — Ambulatory Visit: Payer: BLUE CROSS/BLUE SHIELD | Admitting: Physician Assistant

## 2022-12-29 MED ORDER — CLOPIDOGREL BISULFATE 75 MG PO TABS: ORAL_TABLET | ORAL | 6 refills | Status: DC

## 2022-12-29 MED ORDER — OMEPRAZOLE 20 MG PO CPDR: DELAYED_RELEASE_CAPSULE | ORAL | 0 refills | Status: DC

## 2022-12-29 MED ORDER — CYCLOBENZAPRINE HCL 5 MG PO TABS: 5.0000 mg | ORAL_TABLET | Freq: Three times a day (TID) | ORAL | 1 refills | Status: DC | PRN

## 2022-12-30 ENCOUNTER — Encounter: Payer: BLUE CROSS/BLUE SHIELD | Admitting: Physical Therapy

## 2022-12-31 ENCOUNTER — Ambulatory Visit
Payer: BC Managed Care – PPO | Attending: Student in an Organized Health Care Education/Training Program | Admitting: Student in an Organized Health Care Education/Training Program

## 2022-12-31 ENCOUNTER — Encounter: Payer: Self-pay | Admitting: Student in an Organized Health Care Education/Training Program

## 2022-12-31 ENCOUNTER — Encounter: Payer: Self-pay | Admitting: Family Medicine

## 2022-12-31 VITALS — BP 74/55 | HR 93 | Temp 97.2°F | Resp 16 | Ht 70.0 in | Wt 215.0 lb

## 2022-12-31 DIAGNOSIS — Z8781 Personal history of (healed) traumatic fracture: Secondary | ICD-10-CM | POA: Diagnosis not present

## 2022-12-31 DIAGNOSIS — M79672 Pain in left foot: Secondary | ICD-10-CM | POA: Diagnosis not present

## 2022-12-31 DIAGNOSIS — M25562 Pain in left knee: Secondary | ICD-10-CM | POA: Insufficient documentation

## 2022-12-31 DIAGNOSIS — G894 Chronic pain syndrome: Secondary | ICD-10-CM | POA: Diagnosis not present

## 2022-12-31 DIAGNOSIS — Z79891 Long term (current) use of opiate analgesic: Secondary | ICD-10-CM | POA: Diagnosis not present

## 2022-12-31 DIAGNOSIS — M792 Neuralgia and neuritis, unspecified: Secondary | ICD-10-CM | POA: Diagnosis not present

## 2022-12-31 DIAGNOSIS — M25512 Pain in left shoulder: Secondary | ICD-10-CM | POA: Diagnosis not present

## 2022-12-31 DIAGNOSIS — M79642 Pain in left hand: Secondary | ICD-10-CM | POA: Insufficient documentation

## 2022-12-31 DIAGNOSIS — M545 Low back pain, unspecified: Secondary | ICD-10-CM | POA: Diagnosis not present

## 2022-12-31 DIAGNOSIS — M79641 Pain in right hand: Secondary | ICD-10-CM | POA: Insufficient documentation

## 2022-12-31 DIAGNOSIS — M79671 Pain in right foot: Secondary | ICD-10-CM

## 2022-12-31 DIAGNOSIS — M47816 Spondylosis without myelopathy or radiculopathy, lumbar region: Secondary | ICD-10-CM | POA: Diagnosis present

## 2022-12-31 DIAGNOSIS — Z79899 Other long term (current) drug therapy: Secondary | ICD-10-CM | POA: Insufficient documentation

## 2022-12-31 DIAGNOSIS — M25561 Pain in right knee: Secondary | ICD-10-CM | POA: Diagnosis not present

## 2022-12-31 DIAGNOSIS — G608 Other hereditary and idiopathic neuropathies: Secondary | ICD-10-CM | POA: Insufficient documentation

## 2022-12-31 DIAGNOSIS — E1142 Type 2 diabetes mellitus with diabetic polyneuropathy: Secondary | ICD-10-CM | POA: Diagnosis not present

## 2022-12-31 DIAGNOSIS — G64 Other disorders of peripheral nervous system: Secondary | ICD-10-CM | POA: Insufficient documentation

## 2022-12-31 DIAGNOSIS — G8929 Other chronic pain: Secondary | ICD-10-CM | POA: Insufficient documentation

## 2022-12-31 DIAGNOSIS — M4310 Spondylolisthesis, site unspecified: Secondary | ICD-10-CM | POA: Diagnosis not present

## 2022-12-31 DIAGNOSIS — M170 Bilateral primary osteoarthritis of knee: Secondary | ICD-10-CM | POA: Diagnosis not present

## 2022-12-31 MED ORDER — OXYCODONE-ACETAMINOPHEN 5-325 MG PO TABS: 1.0000 | ORAL_TABLET | Freq: Three times a day (TID) | ORAL | 0 refills | Status: DC

## 2022-12-31 NOTE — Telephone Encounter (Signed)
Please review.  KP

## 2022-12-31 NOTE — Telephone Encounter (Signed)
Pt response.  KP

## 2022-12-31 NOTE — Progress Notes (Signed)
PROVIDER NOTE: Information contained herein reflects review and annotations entered in association with encounter. Interpretation of such information and data should be left to medically-trained personnel. Information provided to patient can be located elsewhere in the medical record under "Patient Instructions". Document created using STT-dictation technology, any transcriptional errors that may result from process are unintentional.    Patient: Gabriella Marsh  Service Category: E/M  Provider: Gillis Santa, MD  DOB: 23-Apr-1966  DOS: 12/31/2022  Referring Provider: [name], FNP  MRN: [id number]  Specialty: Interventional Pain Management  PCP: [name], FNP  Type: Established Patient  Setting: Ambulatory outpatient    Location: Office  Delivery: Face-to-face     HPI  Gabriella Marsh, a 57 y.o. year old female, is here today because of her Diabetic peripheral neuropathy ([place]) [E11.42]. Ms. Druckenmiller primary complain today is Foot Pain (bilateral) Last encounter: My last encounter with her was on 10/06/2022 Pertinent problems: Ms. Munford has Diabetic peripheral neuropathy ([place]); RLS (restless legs syndrome); Chronic low back pain (2ry area of Pain) (Bilateral) (L>R) w/o sciatica; DDD (degenerative disc disease), lumbosacral; Disorder of peripheral nervous system; Chronic pain syndrome; Pharmacologic therapy; Disorder of skeletal system; Problems influencing health status; Chronic hand pain (1ry area of Pain) (Bilateral) (L>R); Chronic feet pain (3ry area of Pain) (Bilateral) (L>R); Chronic shoulder pain (4th area of Pain) (Left); Chronic knee pain (5th area of Pain) (Bilateral) (L>R); Lumbar facet syndrome (Bilateral); Chronic sensorimotor polyneuropathy with axonal and demyelinating features; Osteoarthritis of knees (Bilateral); Tricompartment osteoarthritis of knees (Bilateral);  Grade 1 Retrolisthesis (24m) of L5 over S1; Chronic peripheral neuropathic pain; and Type 2 diabetes  mellitus with peripheral neuropathy (HCC) on their pertinent problem list. Pain Assessment: Severity of Chronic pain is reported as a 2 /10. Location: Foot Right, Left/denies. Onset: More than a month ago. Quality: Stabbing. Timing: Intermittent. Modifying factor(s): medication. Vitals:  height is '5\' 10"'$  (1.778 m) and weight is 215 lb (97.5 kg). Her temperature is 97.2 F (36.2 C) (abnormal). Her blood pressure is 74/55 (abnormal) and her pulse is 93. Her respiration is 16 and oxygen saturation is 95%.   Reason for encounter: medication management.   LBrennynpresents today for medication management.  Continues to have persistent and severe bilateral foot pain related to diabetic neuropathy.  She would like to try and reattempt approval for Qutenza as she found that very beneficial. Otherwise, we will continue her medication as prescribed, Percocet 5 mg 3 times a day as needed. She has been working with physical therapy more frequently and states that while that has been helpful for her mobility and strength, she does have increased pain on those evenings.  07/16/22 Patient presents today for medication management.  Unfortunately, she sustained a right distal femoral shaft fracture and underwent definitive fixation.  She has been at a skilled nursing facility recovering.  She states that she has been receiving pain medications there.  She states that her A1c at the time of her admission was 13.1.  She had a retrograde nail fixation of the right distal femoral shaft fracture on June 19, 2022.  I will increase her oxycodone to 5 mg every 8 hours as needed, quantity 90/month as she recovers from her acute injury in the context of having chronic pain syndrome with diabetic peripheral neuropathy.  I have emphasized the importance of getting her blood sugars better managed as this will help reduce her risk of falls in the future as she will have better proprioception.  She has a steady bowel regimen.  She  continues multimodal analgesics as below.  Follow-up in 8 weeks to reassess her progress.  Pharmacotherapy Assessment  Analgesic: Percocet 5 mg TID as needed, quantity 90/month   Monitoring: [place] PMP: PDMP reviewed during this encounter.       Pharmacotherapy: No side-effects or adverse reactions reported. Compliance: No problems identified. Effectiveness: Clinically acceptable.  Dewayne Shorter, RN  12/31/2022  1:49 PM  Sign when Signing Visit Nursing Pain Medication Assessment:  Safety precautions to be maintained throughout the outpatient stay will include: orient to surroundings, keep bed in low position, maintain call bell within reach at all times, provide assistance with transfer out of bed and ambulation.  Medication Inspection Compliance: Pill count conducted under aseptic conditions, in front of the patient. Neither the pills nor the bottle was removed from the patient's sight at any time. Once count was completed pills were immediately returned to the patient in their original bottle.  Medication: Oxycodone/APAP Pill/Patch Count:  19 of 75 pills remain Pill/Patch Appearance: Markings consistent with prescribed medication Bottle Appearance: Standard pharmacy container. Clearly labeled. Filled Date: 59 / 23 / 2023 Last Medication intake:  Yesterday    UDS:  Summary  Date Value Ref Range Status  11/12/2021 Note  Final    Comment:    ==================================================================== Compliance Drug Analysis, Ur ==================================================================== Test                             Result       Flag       Units  Drug Present and Declared for Prescription Verification   Oxycodone                      1136         EXPECTED   ng/mg creat   Oxymorphone                    1753         EXPECTED   ng/mg creat   Noroxycodone                   1215         EXPECTED   ng/mg creat   Noroxymorphone                 779          EXPECTED   ng/mg  creat    Sources of oxycodone are scheduled prescription medications.    Oxymorphone, noroxycodone, and noroxymorphone are expected    metabolites of oxycodone. Oxymorphone is also available as a    scheduled prescription medication.    Gabapentin                     PRESENT      EXPECTED   Cyclobenzaprine                PRESENT      EXPECTED   Desmethylcyclobenzaprine       PRESENT      EXPECTED    Desmethylcyclobenzaprine is an expected metabolite of    cyclobenzaprine.    Nortriptyline                  PRESENT      EXPECTED    Nortriptyline may be administered as a prescription drug; it is also    an expected metabolite  of amitriptyline.    Trazodone                      PRESENT      EXPECTED   1,3 chlorophenyl piperazine    PRESENT      EXPECTED    1,3-chlorophenyl piperazine is an expected metabolite of trazodone.    Acetaminophen                  PRESENT      EXPECTED ==================================================================== Test                      Result    Flag   Units      Ref Range   Creatinine              165              mg/dL      >=20 ==================================================================== Declared Medications:  The flagging and interpretation on this report are based on the  following declared medications.  Unexpected results may arise from  inaccuracies in the declared medications.   **Note: The testing scope of this panel includes these medications:   Cyclobenzaprine (Flexeril)  Gabapentin (Neurontin)  Nortriptyline (Pamelor)  Oxycodone (Percocet)  Trazodone (Desyrel)   **Note: The testing scope of this panel does not include small to  moderate amounts of these reported medications:   Acetaminophen (Percocet)   **Note: The testing scope of this panel does not include the  following reported medications:   Atorvastatin  Clopidogrel  Dulaglutide (Trulicity)  Glipizide (Glucotrol)  Hydrochlorothiazide  Levothyroxine   Lisinopril (Zestril)  Melatonin  Metformin (Glucophage)  Omeprazole  Triamterene ==================================================================== For clinical consultation, please call [phone]. ====================================================================      ROS  Constitutional: Denies any fever or chills Gastrointestinal: No reported hemesis, hematochezia, vomiting, or acute GI distress Musculoskeletal: right thigh & femur pain Neurological:  Bilateral feet paresthesias  Medication Review  Dulaglutide, Insulin Pen Needle, Melatonin, atorvastatin, clopidogrel, cyclobenzaprine, fluticasone, furosemide, gabapentin, insulin glargine, levothyroxine, lisinopril, metFORMIN, nortriptyline, omeprazole, oxyCODONE-acetaminophen, and traZODone  History Review  Allergy: Ms. Kleinman is allergic to celecoxib, pregabalin, buspar [buspirone], citalopram, and other. Drug: Ms. Mcinnis  reports no history of drug use. Alcohol:  reports no history of alcohol use. Tobacco:  reports that she quit smoking about 11 years ago. Her smoking use included cigarettes. She has never used smokeless tobacco. Social: Ms. Vultaggio  reports that she quit smoking about 11 years ago. Her smoking use included cigarettes. She has never used smokeless tobacco. She reports that she does not drink alcohol and does not use drugs. Medical:  has a past medical history of Arthritis, Blood clotting disorder ([place]), Degenerative disc disease, lumbar, Diabetes mellitus without complication ([place]), GERD (gastroesophageal reflux disease), Headache, Hyperlipidemia, Hypertension, Neuropathy, and Vertigo. Surgical: Ms. Kiner  has a past surgical history that includes Cesarean section; Shoulder surgery (Left, 12/26/014); Abdominal hysterectomy; Tonsillectomy and adenoidectomy; Uterine fibroid surgery; Ovarian cyst surgery; Cholecystectomy; left arm frature (10/12/2020); Thyroidectomy (N/A, 03/28/2021); Parathyroidectomy  (03/28/2021); Amputation toe (Right, 07/24/2021); Colonoscopy with propofol (N/A, 02/20/2022); and right femur surgery (Right). Family: family history includes Congestive Heart Failure in her father; Diabetes in her father; Healthy in her brother; Heart disease in her father; Hyperlipidemia in her father; Hypertension in her father; Irritable bowel syndrome in her mother; Osteoporosis in her maternal grandmother and mother.  Laboratory Chemistry Profile   Renal Lab Results  Component Value Date   BUN 16  05/27/2022   CREATININE 0.84 05/27/2022   BCR 21 04/24/2022   GFR 111.18 06/05/2015   GFRAA 92 01/03/2021   GFRNONAA >60 05/27/2022    Hepatic Lab Results  Component Value Date   AST 24 05/27/2022   ALT 20 05/27/2022   ALBUMIN 4.0 05/27/2022   ALKPHOS 55 05/27/2022   LIPASE 47 05/04/2015    Electrolytes Lab Results  Component Value Date   NA 134 (L) 05/27/2022   K 3.8 05/27/2022   CL 87 (L) 05/27/2022   CALCIUM 9.4 05/27/2022   MG 1.7 05/27/2022    Bone Lab Results  Component Value Date   25OHVITD1 14 (L) 11/12/2021   25OHVITD2 <1.0 11/12/2021   25OHVITD3 13 11/12/2021    Inflammation (CRP: Acute Phase) (ESR: Chronic Phase) Lab Results  Component Value Date   CRP 4 11/12/2021   ESRSEDRATE 39 11/12/2021   LATICACIDVEN 2.4 ([place]) 07/22/2021         Note: Above Lab results reviewed.  Recent Imaging Review  MR Brain W and Wo Contrast CLINICAL DATA:  Left lower extremity weakness  EXAM: MRI HEAD WITHOUT AND WITH CONTRAST  TECHNIQUE: Multiplanar, multiecho pulse sequences of the brain and surrounding structures were obtained without and with intravenous contrast.  CONTRAST:  61m GADAVIST GADOBUTROL 1 MMOL/ML IV SOLN  COMPARISON:  02/12/2017  FINDINGS: Brain: No acute infarct, mass effect or extra-axial collection. No acute or chronic hemorrhage. There is multifocal hyperintense T2-weighted signal within the white matter. Parenchymal volume and CSF spaces  are normal. The midline structures are normal. There is no abnormal contrast enhancement.  Vascular: Major flow voids are preserved.  Skull and upper cervical spine: Normal calvarium and skull base. Visualized upper cervical spine and soft tissues are normal.  Sinuses/Orbits:No paranasal sinus fluid levels or advanced mucosal thickening. No mastoid or middle ear effusion. Normal orbits.  IMPRESSION: 1. No acute intracranial abnormality. 2. Multifocal hyperintense T2-weighted signal within the white matter, nonspecific and unchanged.  Electronically Signed   By: KUlyses JarredM.D.   On: 05/27/2022 22:31 MR THORACIC SPINE WO CONTRAST CLINICAL DATA:  Mid back pain. Left lower extremity weakness.  EXAM: MRI THORACIC SPINE WITHOUT CONTRAST  TECHNIQUE: Multiplanar, multisequence MR imaging of the thoracic spine was performed. No intravenous contrast was administered.  COMPARISON:  Thoracic spine radiographs 04/02/2020  FINDINGS: Alignment: No significant listhesis is present. Mild leftward curvature is again noted in the upper thoracic spine.  Vertebrae: 2 cm hemangioma is present at T8. 11 mm hemangioma is noted inferiorly at T10. Hemangiomas are present at T12. Marrow signal and vertebral body heights are otherwise normal.  Cord:  Normal signal and morphology.  Paraspinal and other soft tissues: A small right pleural effusion is present. Minimal atelectasis is present. Mediastinum is unremarkable. Paraspinous musculature demonstrates mild atrophy.  Disc levels:  Shallow central disc protrusions are present at C5-6 and T6-7 without significant stenosis. Facet hypertrophy contributes to moderate right and mild left foraminal narrowing at T9-10. Foramina are otherwise patent.  IMPRESSION: 1. Moderate right and mild left foraminal stenosis at T9-10 secondary to facet hypertrophy. 2. Shallow central disc protrusions at C5-6 and T6-7 without significant stenosis. 3.  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 4. Small right pleural effusion.  Electronically Signed   By: CSan MorelleM.D.   On: 05/27/2022 21:20 MR LUMBAR SPINE WO CONTRAST CLINICAL DATA:  Low back pain. Cauda equina syndrome suspected.  EXAM: MRI LUMBAR SPINE WITHOUT CONTRAST  TECHNIQUE: Multiplanar, multisequence MR imaging of the  lumbar spine was performed. No intravenous contrast was administered.  COMPARISON:  Lumbar spine radiographs 05/27/2022 and 11/12/2021. MRI of the lumbar spine 06/06/2015  FINDINGS: Segmentation: 5 non rib-bearing lumbar type vertebral bodies are present. The lowest fully formed vertebral body is L5.  Alignment: Slight progression of retrolisthesis noted at L5-S1, now measuring 8 mm. No other significant listhesis is present. Straightening of the normal lumbar lordosis is present.  Vertebrae: Remote wedge deformity of L1 is stable. Chronic fatty endplate marrow changes again noted at L5-S1. Marrow signal and vertebral body heights are otherwise normal.  Conus medullaris and cauda equina: Conus extends to the T12-L1 level. Conus and cauda equina appear normal.  Paraspinal and other soft tissues: Limited imaging the abdomen is unremarkable. There is no significant adenopathy. No solid organ lesions are present.  Disc levels:  L1-2: Negative.  L2-3: Negative.  L3-4: Mild facet hypertrophy is present. No significant disc protrusion or stenosis is present.  L4-5: A rightward disc protrusion is present. No significant focal stenosis is present. Mild facet hypertrophy has progressed slightly.  L5-S1: Uncovering of a central disc protrusion demonstrates some progression. Mild to moderate subarticular and foraminal stenosis has progressed, left greater than right.  IMPRESSION: 1. Progressive mild to moderate subarticular and foraminal stenosis at L5-S1, left greater than right. 2. Slight progression of retrolisthesis at L5-S1. 3.  Rightward disc protrusion at L4-5 without significant stenosis. 4. Mild facet hypertrophy at L3-4 without significant disc protrusion or stenosis.  Electronically Signed   By: San Morelle M.D.   On: 05/27/2022 21:15 US Venous Img Lower Unilateral Left CLINICAL DATA:  Left lower extremity swelling.  EXAM: LEFT LOWER EXTREMITY VENOUS DOPPLER ULTRASOUND  TECHNIQUE: Gray-scale sonography with graded compression, as well as color Doppler and duplex ultrasound were performed to evaluate the lower extremity deep venous systems from the level of the common femoral vein and including the common femoral, femoral, profunda femoral, popliteal and calf veins including the posterior tibial, peroneal and gastrocnemius veins when visible. The superficial great saphenous vein was also interrogated. Spectral Doppler was utilized to evaluate flow at rest and with distal augmentation maneuvers in the common femoral, femoral and popliteal veins.  COMPARISON:  None Available.  FINDINGS: Contralateral Common Femoral Vein: Respiratory phasicity is normal and symmetric with the symptomatic side. No evidence of thrombus. Normal compressibility.  Common Femoral Vein: No evidence of thrombus. Normal compressibility, respiratory phasicity and response to augmentation.  Saphenofemoral Junction: No evidence of thrombus. Normal compressibility and flow on color Doppler imaging.  Profunda Femoral Vein: No evidence of thrombus. Normal compressibility and flow on color Doppler imaging.  Femoral Vein: No evidence of thrombus. Normal compressibility, respiratory phasicity and response to augmentation.  Popliteal Vein: No evidence of thrombus. Normal compressibility, respiratory phasicity and response to augmentation.  Calf Veins: No evidence of thrombus. Normal compressibility and flow on color Doppler imaging.  Other Findings:  None.  IMPRESSION: Negative for deep venous thrombosis in left  lower extremity.  Electronically Signed   By: Markus Daft M.D.   On: 05/27/2022 17:16 DG Pelvis 1-2 Views CLINICAL DATA:  Left leg pain, multiple falls  EXAM: PELVIS - 1-2 VIEW  COMPARISON:  None Available.  FINDINGS: There is no evidence of pelvic fracture or diastasis. No pelvic bone lesions are seen.  IMPRESSION: Negative.  Electronically Signed   By: Lucrezia Europe M.D.   On: 05/27/2022 16:56 DG FEMUR MIN 2 VIEWS LEFT CLINICAL DATA:  Pain, multiple falls  EXAM: LEFT FEMUR 2  VIEWS  COMPARISON:  05/20/2022 and previous  FINDINGS: There is no evidence of fracture or other focal bone lesions. Early marginal spurs involving the lateral femoral condyle, medial tibial plateau, and patellar articular surface. No joint effusion. Soft tissues are unremarkable.  IMPRESSION: 1. No acute findings. 2. Early tricompartment knee DJD  Electronically Signed   By: Lucrezia Europe M.D.   On: 05/27/2022 16:56 DG Lumbar Spine 2-3 Views CLINICAL DATA:  [place].  Back pain.  EXAM: LUMBAR SPINE - 2-3 VIEW  COMPARISON:  11/12/2021  FINDINGS: Normal alignment of the lumbar vertebral bodies. There are mild stable compression deformities but no acute compression fracture. The facets are normally aligned. Stable moderate degenerative disc disease at L5-S1. The visualized bony pelvis is intact. The lower left ribs are intact. Stable age advanced vascular calcifications.  IMPRESSION: 1. Stable mild compression deformities. 2. No acute bony findings. 3. Stable moderate degenerative disc disease at L5-S1.  Electronically Signed   By: Marijo Sanes M.D.   On: 05/27/2022 14:23 CT Head Wo Contrast CLINICAL DATA:  Head trauma, focal neuro findings (Age 29-64y)  EXAM: CT HEAD WITHOUT CONTRAST  TECHNIQUE: Contiguous axial images were obtained from the base of the skull through the vertex without intravenous contrast.  RADIATION DOSE REDUCTION: This exam was performed according to  the departmental dose-optimization program which includes automated exposure control, adjustment of the mA and/or kV according to patient size and/or use of iterative reconstruction technique.  COMPARISON:  CT 05/04/2015.  FINDINGS: Brain: No evidence of acute intracranial hemorrhage or extra-axial collection.No evidence of mass lesion/concerning mass effect.The ventricles are normal in size.  Vascular: No hyperdense vessel or unexpected calcification.  Skull: Hyperostosis frontalis internus. Negative for skull fracture.  Sinuses/Orbits: Minimal ethmoid air cell mucosal thickening. Orbits are unremarkable.  Other: None.  IMPRESSION: No acute intracranial abnormality.  Electronically Signed   By: Maurine Simmering M.D.   On: 05/27/2022 14:07 Note: Reviewed        Physical Exam  General appearance: Well nourished, well developed, and well hydrated. In no apparent acute distress Mental status: Alert, oriented x 3 (person, place, & time)       Respiratory: No evidence of acute respiratory distress Eyes: PERLA Vitals: BP (!) 74/55 Comment: Patient is on 2 blood pressure meds and it makes her feel dizzy.  Patient has informed MD that her blood pressures are running low.y  Pulse 93   Temp (!) 97.2 F (36.2 C)   Resp 16   Ht '5\' 10"'$  (1.778 m)   Wt 215 lb (97.5 kg)   SpO2 95%   BMI 30.85 kg/m  BMI: Estimated body mass index is 30.85 kg/m as calculated from the following:   Height as of this encounter: '5\' 10"'$  (1.778 m).   Weight as of this encounter: 215 lb (97.5 kg). Ideal: Ideal body weight: 68.5 kg (151 lb 0.2 oz) Adjusted ideal body weight: 80.1 kg (176 lb 9.7 oz)  Patient presents in wheelchair.  Right femur pain Bilateral feet paresthesias Foley catheter present  Assessment   Diagnosis Status  1. Diabetic peripheral neuropathy ([place])   2. Chronic peripheral neuropathic pain   3. Chronic sensorimotor polyneuropathy with axonal and demyelinating features   4. History of  proximal humerus fracture (Left)   5. Type 2 diabetes mellitus with peripheral neuropathy (HCC)   6. Osteoarthritis of knees (Bilateral)   7. Chronic use of opiate for therapeutic purpose   8. Disorder of peripheral nervous system   9. Lumbar  facet syndrome (Bilateral)   10. Chronic pain syndrome   11. Pharmacologic therapy   12. Chronic shoulder pain (4th area of Pain) (Left)   13.  Grade 1 Retrolisthesis (33m) of L5 over S1   14. Chronic hand pain (1ry area of Pain) (Bilateral) (L>R)   15. Encounter for medication management   16. Encounter for chronic pain management   17. Chronic knee pain (5th area of Pain) (Bilateral) (L>R)   18. Tricompartment osteoarthritis of knees (Bilateral)   19. Chronic low back pain (2ry area of Pain) (Bilateral) (L>R) w/o sciatica   20. Chronic feet pain (3ry area of Pain) (Bilateral) (L>R)     Worsening Worsening Persistent    Plan of Care    Ms. LElisheva Fallashas a current medication list which includes the following long-term medication(s): atorvastatin, fluticasone, furosemide, gabapentin, gabapentin, lantus solostar, levothyroxine, lisinopril, metformin, nortriptyline, omeprazole, trazodone, [START ON 01/09/2023] oxycodone-acetaminophen, [START ON 02/08/2023] oxycodone-acetaminophen, and [START ON 03/10/2023] oxycodone-acetaminophen.  Pharmacotherapy (Medications Ordered): Meds ordered this encounter  Medications   oxyCODONE-acetaminophen (PERCOCET) 5-325 MG tablet    Sig: Take 1 tablet by mouth every 8 (eight) hours. Must last 30 days.    Dispense:  90 tablet    Refill:  0    DO NOT: delete (not duplicate); no partial-fill (will deny script to complete), no refill request (F/U required). DISPENSE: 1 day early if closed on fill date. WARN: No CNS-depressants within 8 hrs of med.   oxyCODONE-acetaminophen (PERCOCET) 5-325 MG tablet    Sig: Take 1 tablet by mouth every 8 (eight) hours. Must last 30 days.    Dispense:  90 tablet    Refill:   0    DO NOT: delete (not duplicate); no partial-fill (will deny script to complete), no refill request (F/U required). DISPENSE: 1 day early if closed on fill date. WARN: No CNS-depressants within 8 hrs of med.   oxyCODONE-acetaminophen (PERCOCET) 5-325 MG tablet    Sig: Take 1 tablet by mouth every 8 (eight) hours. Must last 30 days.    Dispense:  90 tablet    Refill:  0    DO NOT: delete (not duplicate); no partial-fill (will deny script to complete), no refill request (F/U required). DISPENSE: 1 day early if closed on fill date. WARN: No CNS-depressants within 8 hrs of med.   Orders Placed This Encounter  Procedures   NEUROLYSIS    Please order Qutenza patches from pharmacy    Standing Status:   Future    Standing Expiration Date:   04/01/2023    Order Specific Question:   Where will this procedure be performed?    Answer:   [facility]   ToxASSURE Select 13 (MW), Urine    Volume: 30 ml(s). Minimum 3 ml of urine is needed. Document temperature of fresh sample. Indications: Long term (current) use of opiate analgesic (Z79.891)    Order Specific Question:   Release to patient    Answer:   Immediate     Continue multimodal analgesics as prescribed Continue with PT May re consider [facility] Follow[facility]up plan:   Return in about 3 months (around 04/01/2023) for Medication Management, in person.    Recent Visits Date Type Provider Dept  10/06/22 Office Visit LGillis Santa MD [facility]  Showing recent visits within past 90 days and meeting all other requirements Today's Visits Date Type Provider Dept  12/31/22 Office Visit LGillis Santa MD [facility]  Showing today's visits and meeting all other requirements Future Appointments Date Type Provider Dept  03/25/23 Appointment Gillis Santa, MD [facility]  Showing future appointments within next 90 days and meeting all other requirements  I discussed the  assessment and treatment plan with the patient. The patient was provided an opportunity to ask questions and all were answered. The patient agreed with the plan and demonstrated an understanding of the instructions.  Patient advised to call back or seek an in-person evaluation if the symptoms or condition worsens.  Duration of encounter: 37mnutes.  Total time on encounter, as per AMA guidelines included both the face-to-face and non-face-to-face time personally spent by the physician and/or other qualified health care professional(s) on the day of the encounter (includes time in activities that require the physician or other qualified health care professional and does not include time in activities normally performed by clinical staff). Physician's time may include the following activities when performed: preparing to see the patient (eg, review of tests, pre-charting review of records) obtaining and/or reviewing separately obtained history performing a medically appropriate examination and/or evaluation counseling and educating the patient/family/caregiver ordering medications, tests, or procedures referring and communicating with other health care professionals (when not separately reported) documenting clinical information in the electronic or other health record independently interpreting results (not separately reported) and communicating results to the patient/ family/caregiver care coordination (not separately reported)  Note by: BGillis Santa MD Date: 12/31/2022; Time: 3:10 PM

## 2022-12-31 NOTE — Progress Notes (Signed)
Nursing Pain Medication Assessment:  Safety precautions to be maintained throughout the outpatient stay will include: orient to surroundings, keep bed in low position, maintain call bell within reach at all times, provide assistance with transfer out of bed and ambulation.  Medication Inspection Compliance: Pill count conducted under aseptic conditions, in front of the patient. Neither the pills nor the bottle was removed from the patient's sight at any time. Once count was completed pills were immediately returned to the patient in their original bottle.  Medication: Oxycodone/APAP Pill/Patch Count:  19 of 75 pills remain Pill/Patch Appearance: Markings consistent with prescribed medication Bottle Appearance: Standard pharmacy container. Clearly labeled. Filled Date: 49 / 23 / 2023 Last Medication intake:  Yesterday

## 2023-01-04 ENCOUNTER — Encounter: Payer: BLUE CROSS/BLUE SHIELD | Admitting: Physical Therapy

## 2023-01-06 ENCOUNTER — Encounter: Payer: Self-pay | Admitting: Physical Therapy

## 2023-01-06 ENCOUNTER — Ambulatory Visit: Payer: BC Managed Care – PPO | Admitting: Physical Therapy

## 2023-01-06 DIAGNOSIS — M6281 Muscle weakness (generalized): Secondary | ICD-10-CM

## 2023-01-06 DIAGNOSIS — R262 Difficulty in walking, not elsewhere classified: Secondary | ICD-10-CM

## 2023-01-06 DIAGNOSIS — R2689 Other abnormalities of gait and mobility: Secondary | ICD-10-CM | POA: Diagnosis not present

## 2023-01-06 NOTE — Therapy (Signed)
OUTPATIENT PHYSICAL THERAPY LOWER EXTREMITY TREATMENT    Patient Name: Gabriella Marsh MRN: [id number] DOB:October 15, 1966, 57 y.o., female Today's Date: 01/07/2023   PT End of Session - 01/06/23 1513     Visit Number 22    Number of Visits 42    Date for PT Re-Evaluation 03/16/23    Authorization Type BCBS Other    Authorization Time Period 09/28/22-12/21/22    Progress Note Due on Visit 30    PT Start Time 1515    PT Stop Time 1558    PT Time Calculation (min) 43 min    Equipment Utilized During Treatment Gait belt    Activity Tolerance Patient tolerated treatment well;No increased pain    Behavior During Therapy WFL for tasks assessed/performed            Past Medical History:  Diagnosis Date   Arthritis    knees   Blood clotting disorder ([place])    on toe    Degenerative disc disease, lumbar    Diabetes mellitus without complication (HCC)    GERD (gastroesophageal reflux disease)    Headache    daily - AM (has not been able to have SPG blocks lately)   Hyperlipidemia    Hypertension    Neuropathy    feet   Vertigo    3-4x/yr   Past Surgical History:  Procedure Laterality Date   ABDOMINAL HYSTERECTOMY     But still has cervix   AMPUTATION TOE Right 07/24/2021   Procedure: AMPUTATION TOE;  Surgeon: Sharlotte Alamo, DPM;  Location: [facility] ORS;  Service: Podiatry;  Laterality: Right;   CESAREAN SECTION     CHOLECYSTECTOMY     COLONOSCOPY WITH PROPOFOL N/A 02/20/2022   Procedure: COLONOSCOPY WITH PROPOFOL;  Surgeon: [name], MD;  Location: [facility] ENDOSCOPY;  Service: Gastroenterology;  Laterality: N/A;   left arm frature  10/12/2020   OVARIAN CYST SURGERY     PARATHYROIDECTOMY  03/28/2021   Procedure: PARATHYROIDECTOMY AUTOTRANSPLANT;  Surgeon: Fredirick Maudlin, MD;  Location: [facility] ORS;  Service: General;;   right femur surgery Right    SHOULDER SURGERY Left 12/26/014   Dr. Little Ishikawa, [place] N/A 03/28/2021   Procedure: THYROIDECTOMY, total;   Surgeon: Fredirick Maudlin, MD;  Location: [facility] ORS;  Service: General;  Laterality: N/A;  Provider requesting 3 hours /180 minutes for procedure   TONSILLECTOMY AND ADENOIDECTOMY     UTERINE FIBROID SURGERY     Patient Active Problem List   Diagnosis Date Noted   FTT (failure to thrive) in adult 12/09/2022   Asthenia 12/09/2022   Need for influenza vaccination 09/10/2022   Primary insomnia 09/10/2022   Impaired functional mobility, balance, gait, and endurance 06/09/2022   Type 2 diabetes mellitus with diabetic neuropathy, without long-term current use of insulin ([place]) 04/24/2022   Acquired hypothyroidism 04/24/2022   Hyperlipidemia associated with type 2 diabetes mellitus ([place]) 04/24/2022   Fall 04/24/2022   Dizziness 04/24/2022   Iron deficiency anemia 04/24/2022   Colon cancer screening 01/26/2022   Encounter for screening mammogram for malignant neoplasm of breast 01/26/2022   Chronic sensorimotor polyneuropathy with axonal and demyelinating features 01/12/2022   Osteoarthritis of knees (Bilateral) 01/12/2022   Tricompartment osteoarthritis of knees (Bilateral) 01/12/2022    Grade 1 Retrolisthesis (14m) of L5 over S1 01/12/2022   History of proximal humerus fracture (Left) 01/12/2022   Vitamin D deficiency 01/12/2022   Elevated hemoglobin A1c 01/12/2022   Hypochloremia 01/12/2022   Hyperglycemia 01/12/2022   Chronic peripheral  neuropathic pain 01/12/2022   Type 2 diabetes mellitus with peripheral neuropathy ([place]) 01/12/2022   Glands swollen 01/09/2022   Acute cough 01/09/2022   Acute non-recurrent pansinusitis 01/09/2022   Rhinorrhea 01/09/2022   Left acute otitis media 01/09/2022   Pharmacologic therapy 11/12/2021   Disorder of skeletal system 11/12/2021   Problems influencing health status 11/12/2021   Chronic hand pain (1ry area of Pain) (Bilateral) (L>R) 11/12/2021   Chronic feet pain (3ry area of Pain) (Bilateral) (L>R) 11/12/2021   Chronic shoulder pain (4th area of  Pain) (Left) 11/12/2021   Chronic knee pain (5th area of Pain) (Bilateral) (L>R) 11/12/2021   Lumbar facet syndrome (Bilateral) 11/12/2021   Muscle spasm 10/22/2021   Type 2 diabetes mellitus with microalbuminuria, without long-term current use of insulin ([place]) 10/22/2021   Diabetic foot infection ([place]) 07/22/2021   Depression with anxiety 07/22/2021   Sepsis ([place]) 07/22/2021   COVID-19 virus infection 07/22/2021   Hypocalcemia 07/17/2021   Chronic pain syndrome 07/17/2021   S/P total thyroidectomy 03/28/2021   Thyroid nodule    Schamberg's purpura 10/09/2019   Leg swelling 10/09/2019   Benign positional vertigo 02/03/2016   Common migraine with intractable migraine 06/18/2015   Anxiety 05/04/2015   Adaptation reaction 05/03/2015   DDD (degenerative disc disease), lumbosacral 05/03/2015   Hypercholesteremia 05/03/2015   Insomnia due to medical condition 05/03/2015   Adiposity 05/03/2015   Disorder of peripheral nervous system 05/03/2015   Snores 05/03/2015   Diabetic peripheral neuropathy ([place]) 03/06/2015   Essential hypertension 03/06/2015   Elevated liver function tests 03/06/2015   Obesity 03/06/2015   GERD (gastroesophageal reflux disease) 03/06/2015   Multinodular goiter 03/06/2015   RLS (restless legs syndrome) 03/06/2015   Chronic low back pain (2ry area of Pain) (Bilateral) (L>R) w/o sciatica 03/06/2015   Leg cramps 03/06/2015    PCP: [name], FNP  REFERRING PROVIDER: [name], FNP  REFERRING DIAG: [phone].[id number] (ICD-10-CM) - Fall, subsequent encounter  THERAPY DIAG:  Difficulty in walking, not elsewhere classified  Other abnormalities of gait and mobility  Muscle weakness (generalized)  Rationale for Evaluation and Treatment Rehabilitation  ONSET DATE: 06/18/22  SUBJECTIVE:   SUBJECTIVE STATEMENT: Pt reports feeling weak this date. She thinks it could be from having a lazy weekend but she still did her exercises.   PERTINENT HISTORY: Pt  reports her fall initially occurred in the evening when her husband was not home. Pt got up and had her Rolator and she walked toward her hallway. When she turned around to turn the light on she went completely down on her R leg and broke her femur distally and twisted her knee. When EMT arrived she had to straighten her LE to immobilize it. Pt went to [facility] hospital for the trauma.   Upon arrival she was evaluated and had to get MRI and imaging. Progressed to 06/19/22. Pt had surgery that night. Pt had to have plate and screws in her knee and rod in her femur. Pt was in hospital for 11 days then progressed to [facility] for 30 days. [facility].   Upon coming home with [facility] pt came home with catheter. Pt came home and was relieved. Pt husband helps with everything doing "everything" for her except she helps with cooking.   Pt considered home health physical therapy but the cost of care would be too high ( $125 per visit) and this.   Prior to injury pt was  ambulating with rolator. Pt used bedside commode at that time. Pt reports 6 falls during this year. Pt reports 2-3 falls with rolator and 2-3 falls walking between bathroom and bedroom and 1 fall in bedroom.   Pt had another fall at [place] where she broke her left humerus about 2 years ago.   PAIN:  Are you having pain? No  PRECAUTIONS: Other: WBAT  WEIGHT BEARING RESTRICTIONS     Gardenia Phlegm, MD MPH - 09/03/2022 3:15 PM EDT   Formatting of this note might be different from the original.  At this time, you can continue weightbearing as tolerated to right lower extremity. Please continue working on range of motion and strengthening. We have given you prescription for physical therapy.   FALLS:  Has patient fallen in last 6 months? Yes. 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 Pt does not know if she will be able to sit in one position for that long. Not sure if it will be feasible to get back to work.   PLOF: Requires assistive device for independence, Needs assistance with ADLs, Needs assistance with homemaking, and Needs assistance with gait  PATIENT GOALS Pt would like to get legs moving a little bit more. Pt wants to be able to get to bathroom on her own.  Get upper body strength improved.      TODAY'S TREATMENT: 01/07/23    Patient is positioned next to mat table and transfers via slide board with min assist from physical therapist due to increased height of mat table.  Patient performs the following exercises at the mat table  Seated LAQ AROM on the R and AAROM on the left 3 x 10 each lower extremity  Seated heel raise 2 x 20 reps ea LE   Marching 2 pound ankle weight on right lower extremity completes active range of motion with resistance on the right for 3 sets of 10 repetitions active assisted range of motion on the left with no weight for 3 x 10 repetitions  Physical therapist attempt sit to stand with +1 max assist with patient unable to complete secondary to not feeling comfortable.  For all of these mat table is raised to increased ease of sit to stand transfer and also to mimic height of her power recliner type chair at home.  Patient does not adequately shift weight onto her lower extremities for successful completion and shift weight posteriorly secondary to fear of falling.  Physical therapist recruits a second physical therapist to increase patient confidence and patient able to complete with mod assist x 1 and contact-guard assist x 1 from physical therapist and into standing with bilateral upper  extremity assist -In standing patient completes several heel raises on each side and then transitions to sidestepping in order to work toward stand pivot transfer with walker.  Patient able to complete right sidestepping easier than left was able to complete to either side without loss of balance but with close contact-guard assist throughout.   Seated hip abduction  10x with ball 10 x no resistance from ball but through larger ROM, AAROM on the left from the PT  Patient instructed in steps for stand pivot transfer involving moving the walker a small amount followed by moving her lower extremities to square up with walker and repeating until she is at her destination.  Patient verbalizes understanding  Physical therapist positions wheelchair to right of patient in positions standard walker anterior to patient.  Physical therapy prescription another physical therapist for assistance with this task.  Patient completes sit to stand transfer with min assist from physical therapist, contact-guard assist from another physical therapist and with assistance from walker as well as with height of mat table raised.  Patient encouraged to take walker and moved slightly and then step towards that in order to take steps towards transition to her wheelchair.  Following several steps approximately 5-6 patient is able to score up with her wheelchair is encouraged to reach upper extremity posterior to control stand to sit transition.  Patient does all of this well but with high fear of falling throughout and high effort.   Unless otherwise stated, CGA was provided and gait belt donned in order to ensure pt safety with all standing interventions and with all transfers       PATIENT EDUCATION:  Education details: HEP, POC Person educated: Patient Education method: Explanation Education comprehension: verbalized understanding   HOME EXERCISE PROGRAM: Access Code: [email or web address] URL:  [email or web address] Date: 09/28/2022 Prepared by: Rivka Barbara  Exercises - Seated March  - 1 x daily - 7 x weekly - 2 sets - 10 reps - Seated Long Arc Quad  - 1 x daily - 7 x weekly - 2 sets - 10 reps - Seated Heel Raise  - 1 x daily - 7 x weekly - 2 sets - 20 reps - Seated hip abduction with band  - 1 x daily - 7 x weekly - 2 sets - 10 reps - Seated Hip Adduction Isometrics with Ball  - 1 x daily - 7 x weekly - 2 sets - 10 reps  ASSESSMENT:  CLINICAL IMPRESSION:  Patient demonstrates good progress with therapeutic interventions this session.  Patient in decreased spirits this session secondary to insurance limitations and only being able to come once a week she feels she is not progressing at the same rate as she was prior to insurance change.  Patient also reports her husband has been having to work more and has been able to help her with her exercises or get her up and down.  Patient encouraged patient to continue to be diligent with home exercise program as best that she is able.  Patient does make good progress as evidenced by ability to perform stand pivot transfer with min assist from physical therapist for transition as well as with contact-guard assist from PT x 2 and set up assistance.  This is the first time patient has been able to make this transfer will continue to benefit from skilled physical therapy working on transfers lower extremity strength and lower extremity stability.  Patient making good progress at this time will continue to benefit from skilled physical therapy to improve her lower extremity strength, function, quality of life.   OBJECTIVE IMPAIRMENTS Abnormal gait, decreased activity tolerance, decreased balance, decreased endurance, decreased mobility, difficulty walking, decreased ROM, decreased strength, impaired perceived functional ability, and impaired sensation.   ACTIVITY LIMITATIONS carrying, lifting, bending, standing, squatting, stairs,  transfers, bed mobility, bathing, toileting, dressing, self feeding, hygiene/grooming, and locomotion level  PARTICIPATION LIMITATIONS: meal prep, cleaning, laundry, driving, shopping, community activity, occupation, and yard work  PERSONAL FACTORS 3+  comorbidities: Polyneuropathy, hypertension, hyperlipidemia, diabetes mellitus, arthritis  are also affecting patient's functional outcome.   REHAB POTENTIAL: Fair secondary to comorbidities above particularly polyneuropathy affecting her lower extremity strength and muscle activation  CLINICAL DECISION MAKING: Evolving/moderate complexity  EVALUATION COMPLEXITY: Moderate   GOALS: Goals reviewed with patient? Yes  SHORT TERM GOALS: Target date: 10/26/2022     Patient will be independent in home exercise program to improve strength/mobility for better functional independence with ADLs. Baseline: No HEP currently 11/15: Completing Hep regularly.  Goal status: Met    LONG TERM GOALS: Target date: 03/16/2023    Patient will be modified independent for slide board transfers in order to improve her ability to transfer to and from wheelchair as well as bedside commode for improvement in ADLs. Baseline: Patient currently requires assistance with slide board transfers is unable to utilize bedside commode and requires use of catheter and bedpan 11/15: Pt completing slide board transfers and only requires assistance with placing slide board and maneuvering WC 1/3: Patient able to demonstrate modified independent slide board transfer from mat table to wheelchair demonstrating ability to place slide board appropriately and perform transfer with only assistance needed for chair positioning prior to transfer Goal status: MET  2.  Patient will perform sit to and from stand with min assist from physical therapist in order to indicate improved lower extremity functional strength and improved ability to perform functional transfers and functional leg  activities within her home. Baseline: Patient unable to stand independently at this time due to lower extremity weakness 11/04/2022: Patient is able to perform anterior lean and pull on parallel bars and activate gluteal muscles was unable to lift weight from wheelchair Goal status: IN PROGRESS  3.  Patient will improve focus on therapeutic outcome score to 35 or greater in order to indicate improved perceived functional ability Baseline: 19 11/15: 40 12/22/22: 43 Goal status: MET  4.  Patient will perform stand pivot transfer from wheelchair to mat table with moderate assistance or less in order to improve her ability to perform transfers within the home to improve her overall functional capacity. Baseline: Unable to complete 11/04/2022: Patient is able to perform anterior lean and pull on parallel bars and activate gluteal muscles was unable to lift weight from wheelchair 12/22/22: Able to stand in // bars with B UE support and with 2 x Mod-Max A from PT and with PT stabilizing LE at knee.  Goal status: IN PROGRESS  4.  Pt will improve LEFS by 10 points or more in order to indicate improved LE function and mobility.  Baseline: 9 : 12/22/22: 26 Goal status: MET  5.  Patient will demonstrate ability to stand from elevated mat table with x 1 mod to max assist at rolling walker in order to show progress with stand pivot transfer with caregiver to bedside commode.  Baseline: 1/3: able to stand in // bars with x 2 mod-max A consistently Goal status: INITIAL   6.  Patient will improve LEFS score by 10 points or more in order to indicate improved lower extremity function and mobility  Baseline: 12/23/22: 26  Goal status: INITIAL    PLAN: PT FREQUENCY: 2x/week  PT DURATION: 12 weeks  PLANNED INTERVENTIONS: Therapeutic exercises, Therapeutic activity, Neuromuscular re-education, Balance training, Gait training, Patient/Family education, Self Care, Joint mobilization, Stair training, DME instructions,  and Electrical stimulation  PLAN FOR NEXT SESSION: Nustep, standing in // bars as able, LE strength, supine strengthening potentially, sit to stand transfers from elevated  mat table/ plinth using RW and instructing husband in assistance techniques when able ( when he is able to come to appointments, he works during some appointment dates/ times)    Particia Lather PT  01/07/2023, 7:50 AM

## 2023-01-06 NOTE — Progress Notes (Signed)
01/07/2023 7:41 PM   Gabriella Marsh 17-May-1966 [id number]  Referring provider: [name], [place],  [place] [address]  Urological history: 1.  Urinary retention -Contributing factors of not ambulation, uncontrolled diabetes, lumbar DDD obesity  Chief Complaint  Patient presents with   Follow-up    HPI: Gabriella Marsh is a 57 y.o. female who presents today for TOV.  Unfortunately when she presented to [place], we found her blood pressure to be 64/42.  She states that her low pressure was first discovered when she was at Dr. Precious Gilding office for pain management and it was 74/55 at that time.  She contacted her PCPs office and they discovered that she was on 3 medications for her blood pressure (furosemide, triamterene and lisinopril ).  She was told that she was only to be taking the lisinopril, but patient was taking the other medications because refills were given.  She had been feeling better after stopping the furosemide and the triamterene, but last night she felt that she did not have enough urine in her catheter bag so she took a furosemide.  This morning she states she is feeling "weird" and lightheaded.    Patient denies any modifying or aggravating factors.  Patient denies any gross hematuria, dysuria or suprapubic/flank pain.  Patient denies any fevers, chills, nausea or vomiting.    PMH: Past Medical History:  Diagnosis Date   Arthritis    knees   Blood clotting disorder ([place])    on toe    Degenerative disc disease, lumbar    Diabetes mellitus without complication (HCC)    GERD (gastroesophageal reflux disease)    Headache    daily - AM (has not been able to have SPG blocks lately)   Hyperlipidemia    Hypertension    Neuropathy    feet   Vertigo    3-4x/yr    Surgical History: Past Surgical History:  Procedure Laterality Date   ABDOMINAL HYSTERECTOMY     But still has cervix   AMPUTATION TOE Right 07/24/2021    Procedure: AMPUTATION TOE;  Surgeon: Sharlotte Alamo, DPM;  Location: [facility] ORS;  Service: Podiatry;  Laterality: Right;   CESAREAN SECTION     CHOLECYSTECTOMY     COLONOSCOPY WITH PROPOFOL N/A 02/20/2022   Procedure: COLONOSCOPY WITH PROPOFOL;  Surgeon: [name], MD;  Location: [facility] ENDOSCOPY;  Service: Gastroenterology;  Laterality: N/A;   left arm frature  10/12/2020   OVARIAN CYST SURGERY     PARATHYROIDECTOMY  03/28/2021   Procedure: PARATHYROIDECTOMY AUTOTRANSPLANT;  Surgeon: Fredirick Maudlin, MD;  Location: [facility] ORS;  Service: General;;   right femur surgery Right    SHOULDER SURGERY Left 12/26/014   Dr. Little Ishikawa, [place] N/A 03/28/2021   Procedure: THYROIDECTOMY, total;  Surgeon: Fredirick Maudlin, MD;  Location: [facility] ORS;  Service: General;  Laterality: N/A;  Provider requesting 3 hours /180 minutes for procedure   TONSILLECTOMY AND ADENOIDECTOMY     UTERINE FIBROID SURGERY      Home Medications:  Allergies as of 01/07/2023       Reactions   Celecoxib Shortness Of Breath   Pregabalin Shortness Of Breath   Buspar [buspirone] Other (See Comments)   Tachycardia   Citalopram Cough   Other Cough   [place] pear trees        Medication List        Accurate as of January 07, 2023 10:27 AM. If you have any questions, ask  your nurse or doctor.          atorvastatin 20 MG tablet Commonly known as: LIPITOR TAKE 1 TABLET(20 MG) BY MOUTH DAILY   clopidogrel 75 MG tablet Commonly known as: PLAVIX TAKE 1 TABLET(75 MG) BY MOUTH DAILY   cyclobenzaprine 5 MG tablet Commonly known as: FLEXERIL Take 1 tablet (5 mg total) by mouth 3 (three) times daily as needed for muscle spasms.   fluticasone 50 MCG/ACT nasal spray Commonly known as: FLONASE Place 2 sprays into both nostrils daily.   furosemide 20 MG tablet Commonly known as: LASIX TAKE 2 TABLETS(40 MG) BY MOUTH DAILY AS NEEDED FOR FLUID RETENTION OR SWELLING   gabapentin 100 MG capsule Commonly known  as: NEURONTIN TAKE 2 CAPSULES THREE TIMES A DAY ALONG WITH 600 MG THREE TIMES A DAY FOR A TOTAL DAILY DOSE OF 2400 MG DAILY   gabapentin 600 MG tablet Commonly known as: NEURONTIN TAKE 1 TABLET BY MOUTH 3 TIMES  DAILY   Insulin Pen Needle 31G X 5 MM Misc 1 each by Does not apply route daily.   Lantus SoloStar 100 UNIT/ML Solostar Pen Generic drug: insulin glargine INJECT 20 UNITS UNDER THE SKIN AT BEDTIME, TITRATE TO FASTING SUGAR OF 130-180 AS DIRECTED   levothyroxine 25 MCG tablet Commonly known as: SYNTHROID Take 1 tablet (25 mcg total) by mouth daily. Please take WITH your 200 mcg dose. Recommend labs in 6-8 weeks.   lisinopril 5 MG tablet Commonly known as: ZESTRIL TAKE 1 TABLET(5 MG) BY MOUTH DAILY   Melatonin 10 MG Tabs Take 10 mg by mouth at bedtime.   metFORMIN 500 MG tablet Commonly known as: GLUCOPHAGE Take 2 tablets (1,000 mg total) by mouth 2 (two) times daily with a meal.   nortriptyline 25 MG capsule Commonly known as: PAMELOR TAKE 2 CAPSULES BY MOUTH AT  BEDTIME   omeprazole 20 MG capsule Commonly known as: PRILOSEC TAKE 1 CAPSULE(20 MG) BY MOUTH DAILY   oxyCODONE-acetaminophen 5-325 MG tablet Commonly known as: Percocet Take 1 tablet by mouth every 8 (eight) hours. Must last 30 days.   oxyCODONE-acetaminophen 5-325 MG tablet Commonly known as: Percocet Take 1 tablet by mouth every 8 (eight) hours. Must last 30 days. Start taking on: February 08, 2023   oxyCODONE-acetaminophen 5-325 MG tablet Commonly known as: Percocet Take 1 tablet by mouth every 8 (eight) hours. Must last 30 days. Start taking on: March 10, 2023   traZODone 50 MG tablet Commonly known as: DESYREL Take 2 tablets (100 mg total) by mouth at bedtime.   Trulicity 3 JQ/7.3AL Sopn Generic drug: Dulaglutide Inject 3 mg as directed once a week.        Allergies:  Allergies  Allergen Reactions   Celecoxib Shortness Of Breath   Pregabalin Shortness Of Breath   Buspar  [Buspirone] Other (See Comments)    Tachycardia   Citalopram Cough   Other Cough    [place] pear trees    Family History: Family History  Problem Relation Age of Onset   Diabetes Father    Heart disease Father    Hypertension Father    Hyperlipidemia Father    Congestive Heart Failure Father    Healthy Brother    Osteoporosis Mother    Irritable bowel syndrome Mother    Osteoporosis Maternal Grandmother     Social History:  reports that she quit smoking about 11 years ago. Her smoking use included cigarettes. She has never used smokeless tobacco. She reports that she does not  drink alcohol and does not use drugs.  ROS: Pertinent ROS in HPI  Physical Exam: BP (!) 68/43 (BP Location: Left Arm, Patient Position: Sitting, Cuff Size: Normal)   Pulse 80   Constitutional:  Well nourished. Alert and oriented, No acute distress. HEENT: [place] AT, moist mucus membranes.  Trachea midline Cardiovascular: No clubbing, cyanosis, or edema. Respiratory: Normal respiratory effort, no increased work of breathing. Neurologic: Grossly intact, no focal deficits, moving all 4 extremities. Psychiatric: Normal mood and affect.    Laboratory Data: N/A   Pertinent Imaging: N/A  Assessment & Plan:    1.  Hypotension -Advised her to seek treatment in the emergency department as she is likely in need of IV fluid as well as labs to check renal function and blood sugar status as she is diabetic  2. Urinary retention -Foley catheter was not removed today and we will need to reschedule a voiding trial  Return for transferred to the ED .  These notes generated with voice recognition software. I apologize for typographical errors.  [place], [place] [address]  [place], [place] [address] [phone]

## 2023-01-07 ENCOUNTER — Emergency Department: Payer: BC Managed Care – PPO

## 2023-01-07 ENCOUNTER — Ambulatory Visit (INDEPENDENT_AMBULATORY_CARE_PROVIDER_SITE_OTHER): Payer: BC Managed Care – PPO | Admitting: Urology

## 2023-01-07 ENCOUNTER — Encounter: Payer: Self-pay | Admitting: Urology

## 2023-01-07 ENCOUNTER — Other Ambulatory Visit: Payer: Self-pay

## 2023-01-07 ENCOUNTER — Observation Stay
Admission: EM | Admit: 2023-01-07 | Discharge: 2023-01-09 | Disposition: A | Discharge status: Home / Self Care | Payer: BC Managed Care – PPO | Attending: Hospitalist | Admitting: Hospitalist

## 2023-01-07 ENCOUNTER — Encounter: Payer: Self-pay | Admitting: Physical Therapy

## 2023-01-07 ENCOUNTER — Ambulatory Visit: Payer: BLUE CROSS/BLUE SHIELD | Admitting: Physician Assistant

## 2023-01-07 VITALS — BP 68/43 | HR 80

## 2023-01-07 DIAGNOSIS — E1140 Type 2 diabetes mellitus with diabetic neuropathy, unspecified: Secondary | ICD-10-CM | POA: Insufficient documentation

## 2023-01-07 DIAGNOSIS — I10 Essential (primary) hypertension: Secondary | ICD-10-CM | POA: Diagnosis not present

## 2023-01-07 DIAGNOSIS — R339 Retention of urine, unspecified: Secondary | ICD-10-CM

## 2023-01-07 DIAGNOSIS — Z87891 Personal history of nicotine dependence: Secondary | ICD-10-CM | POA: Diagnosis not present

## 2023-01-07 DIAGNOSIS — E1142 Type 2 diabetes mellitus with diabetic polyneuropathy: Secondary | ICD-10-CM | POA: Diagnosis present

## 2023-01-07 DIAGNOSIS — M79604 Pain in right leg: Secondary | ICD-10-CM | POA: Diagnosis not present

## 2023-01-07 DIAGNOSIS — N179 Acute kidney failure, unspecified: Secondary | ICD-10-CM | POA: Diagnosis present

## 2023-01-07 DIAGNOSIS — E669 Obesity, unspecified: Secondary | ICD-10-CM | POA: Diagnosis present

## 2023-01-07 DIAGNOSIS — Z7901 Long term (current) use of anticoagulants: Secondary | ICD-10-CM | POA: Insufficient documentation

## 2023-01-07 DIAGNOSIS — K219 Gastro-esophageal reflux disease without esophagitis: Secondary | ICD-10-CM | POA: Diagnosis present

## 2023-01-07 DIAGNOSIS — Z79899 Other long term (current) drug therapy: Secondary | ICD-10-CM | POA: Diagnosis not present

## 2023-01-07 DIAGNOSIS — G894 Chronic pain syndrome: Secondary | ICD-10-CM | POA: Diagnosis present

## 2023-01-07 DIAGNOSIS — G608 Other hereditary and idiopathic neuropathies: Secondary | ICD-10-CM | POA: Diagnosis present

## 2023-01-07 DIAGNOSIS — A419 Sepsis, unspecified organism: Secondary | ICD-10-CM | POA: Diagnosis not present

## 2023-01-07 DIAGNOSIS — E039 Hypothyroidism, unspecified: Secondary | ICD-10-CM | POA: Insufficient documentation

## 2023-01-07 DIAGNOSIS — M79605 Pain in left leg: Secondary | ICD-10-CM | POA: Diagnosis not present

## 2023-01-07 DIAGNOSIS — I959 Hypotension, unspecified: Secondary | ICD-10-CM | POA: Insufficient documentation

## 2023-01-07 DIAGNOSIS — Z7984 Long term (current) use of oral hypoglycemic drugs: Secondary | ICD-10-CM | POA: Diagnosis not present

## 2023-01-07 DIAGNOSIS — N319 Neuromuscular dysfunction of bladder, unspecified: Secondary | ICD-10-CM | POA: Diagnosis present

## 2023-01-07 DIAGNOSIS — N390 Urinary tract infection, site not specified: Principal | ICD-10-CM

## 2023-01-07 DIAGNOSIS — E876 Hypokalemia: Secondary | ICD-10-CM | POA: Diagnosis present

## 2023-01-07 DIAGNOSIS — Z794 Long term (current) use of insulin: Secondary | ICD-10-CM | POA: Insufficient documentation

## 2023-01-07 DIAGNOSIS — I9589 Other hypotension: Secondary | ICD-10-CM | POA: Diagnosis not present

## 2023-01-07 DIAGNOSIS — R42 Dizziness and giddiness: Secondary | ICD-10-CM | POA: Diagnosis not present

## 2023-01-07 LAB — COMPREHENSIVE METABOLIC PANEL
ALT: 76 U/L — ABNORMAL HIGH (ref 0–44)
AST: 57 U/L — ABNORMAL HIGH (ref 15–41)
Albumin: 4 g/dL (ref 3.5–5.0)
Alkaline Phosphatase: 55 U/L (ref 38–126)
Anion gap: 14 (ref 5–15)
BUN: 18 mg/dL (ref 6–20)
CO2: 24 mmol/L (ref 22–32)
Calcium: 8.2 mg/dL — ABNORMAL LOW (ref 8.9–10.3)
Chloride: 97 mmol/L — ABNORMAL LOW (ref 98–111)
Creatinine, Ser: 1.06 mg/dL — ABNORMAL HIGH (ref 0.44–1.00)
GFR, Estimated: >60 mL/min (ref >60)
Glucose, Bld: 169 mg/dL — ABNORMAL HIGH (ref 70–99)
Potassium: 3.4 mmol/L — ABNORMAL LOW (ref 3.5–5.1)
Sodium: 135 mmol/L (ref 135–145)
Total Bilirubin: 1.2 mg/dL (ref 0.3–1.2)
Total Protein: 7.3 g/dL (ref 6.5–8.1)

## 2023-01-07 LAB — TROPONIN I (HIGH SENSITIVITY)
Troponin I (High Sensitivity): 3 ng/L (ref <18)
Troponin I (High Sensitivity): 3 ng/L (ref <18)

## 2023-01-07 LAB — HIV ANTIBODY (ROUTINE TESTING W REFLEX): HIV Screen 4th Generation wRfx: NONREACTIVE

## 2023-01-07 LAB — CBC
HCT: 39.8 % (ref 36.0–46.0)
Hemoglobin: 13 g/dL (ref 12.0–15.0)
MCH: 28.9 pg (ref 26.0–34.0)
MCHC: 32.7 g/dL (ref 30.0–36.0)
MCV: 88.4 fL (ref 80.0–100.0)
Platelets: 391 10*3/uL (ref 150–400)
RBC: 4.5 MIL/uL (ref 3.87–5.11)
RDW: 14.6 % (ref 11.5–15.5)
WBC: 13.5 10*3/uL — ABNORMAL HIGH (ref 4.0–10.5)
nRBC: 0 % (ref 0.0–0.2)

## 2023-01-07 LAB — URINE DRUG SCREEN, QUALITATIVE (ARMC ONLY)
Amphetamines, Ur Screen: NOT DETECTED
Barbiturates, Ur Screen: NOT DETECTED
Benzodiazepine, Ur Scrn: NOT DETECTED
Cannabinoid 50 Ng, Ur ~~LOC~~: NOT DETECTED
Cocaine Metabolite,Ur ~~LOC~~: NOT DETECTED
MDMA (Ecstasy)Ur Screen: NOT DETECTED
Methadone Scn, Ur: NOT DETECTED
Opiate, Ur Screen: NOT DETECTED
Phencyclidine (PCP) Ur S: NOT DETECTED
Tricyclic, Ur Screen: POSITIVE — AB

## 2023-01-07 LAB — URINALYSIS, ROUTINE W REFLEX MICROSCOPIC
Bilirubin Urine: NEGATIVE
Glucose, UA: NEGATIVE mg/dL
Hgb urine dipstick: NEGATIVE
Ketones, ur: NEGATIVE mg/dL
Nitrite: NEGATIVE
Protein, ur: 30 mg/dL — AB
Specific Gravity, Urine: 1.021 (ref 1.005–1.030)
pH: 5 (ref 5.0–8.0)

## 2023-01-07 LAB — TSH: TSH: 166 u[IU]/mL — ABNORMAL HIGH (ref 0.350–4.500)

## 2023-01-07 LAB — LACTIC ACID, PLASMA
Lactic Acid, Venous: 4.1 mmol/L (ref 0.5–1.9)
Lactic Acid, Venous: 2.5 mmol/L (ref 0.5–1.9)
Lactic Acid, Venous: 2.9 mmol/L (ref 0.5–1.9)

## 2023-01-07 LAB — MAGNESIUM: Magnesium: 1.8 mg/dL (ref 1.7–2.4)

## 2023-01-07 LAB — PREGNANCY, URINE: Preg Test, Ur: NEGATIVE

## 2023-01-07 LAB — D-DIMER, QUANTITATIVE: D-Dimer, Quant: 0.34 ug/mL-FEU (ref 0.00–0.50)

## 2023-01-07 LAB — CBG MONITORING, ED
Glucose-Capillary: 135 mg/dL — ABNORMAL HIGH (ref 70–99)
Glucose-Capillary: 135 mg/dL — ABNORMAL HIGH (ref 70–99)

## 2023-01-07 LAB — T4, FREE: Free T4: 0.28 ng/dL — ABNORMAL LOW (ref 0.61–1.12)

## 2023-01-07 MED ORDER — GABAPENTIN 400 MG PO CAPS
800.0000 mg | ORAL_CAPSULE | Freq: Three times a day (TID) | ORAL | Status: DC
  Administered 2023-01-07 – 2023-01-09 (×6): 800 mg via ORAL
  Filled 2023-01-07 (×6): qty 2

## 2023-01-07 MED ORDER — OXYCODONE-ACETAMINOPHEN 5-325 MG PO TABS: 1.0000 | ORAL_TABLET | Freq: Three times a day (TID) | ORAL | Status: DC

## 2023-01-07 MED ORDER — SODIUM CHLORIDE 0.9 % IV SOLN
1.0000 g | Freq: Once | INTRAVENOUS | Status: AC
  Administered 2023-01-07: 1 g via INTRAVENOUS
  Filled 2023-01-07: qty 10

## 2023-01-07 MED ORDER — ENOXAPARIN SODIUM 40 MG/0.4ML IJ SOSY
40.0000 mg | PREFILLED_SYRINGE | Freq: Every day | INTRAMUSCULAR | Status: DC
  Filled 2023-01-07 (×2): qty 0.4

## 2023-01-07 MED ORDER — MELATONIN 5 MG PO TABS
10.0000 mg | ORAL_TABLET | Freq: Every day | ORAL | Status: DC
  Administered 2023-01-07 – 2023-01-08 (×2): 10 mg via ORAL
  Filled 2023-01-07 (×2): qty 2

## 2023-01-07 MED ORDER — LEVOTHYROXINE SODIUM 50 MCG PO TABS: 25.0000 ug | ORAL_TABLET | Freq: Every day | ORAL | Status: DC

## 2023-01-07 MED ORDER — LEVOTHYROXINE SODIUM 50 MCG PO TABS
225.0000 ug | ORAL_TABLET | Freq: Every day | ORAL | Status: DC
  Administered 2023-01-08 – 2023-01-09 (×2): 225 ug via ORAL
  Filled 2023-01-07 (×2): qty 1

## 2023-01-07 MED ORDER — POTASSIUM CHLORIDE CRYS ER 20 MEQ PO TBCR
40.0000 meq | EXTENDED_RELEASE_TABLET | Freq: Once | ORAL | Status: AC
  Administered 2023-01-07: 40 meq via ORAL
  Filled 2023-01-07: qty 2

## 2023-01-07 MED ORDER — PANTOPRAZOLE SODIUM 40 MG PO TBEC
40.0000 mg | DELAYED_RELEASE_TABLET | Freq: Every day | ORAL | Status: DC
  Administered 2023-01-07 – 2023-01-09 (×3): 40 mg via ORAL
  Filled 2023-01-07 (×3): qty 1

## 2023-01-07 MED ORDER — LACTATED RINGERS IV BOLUS
500.0000 mL | Freq: Once | INTRAVENOUS | Status: AC
  Administered 2023-01-07: 500 mL via INTRAVENOUS

## 2023-01-07 MED ORDER — TRAZODONE HCL 100 MG PO TABS
100.0000 mg | ORAL_TABLET | Freq: Every day | ORAL | Status: DC
  Administered 2023-01-07 – 2023-01-08 (×2): 100 mg via ORAL
  Filled 2023-01-07 (×2): qty 1

## 2023-01-07 MED ORDER — ACETAMINOPHEN 325 MG PO TABS
650.0000 mg | ORAL_TABLET | Freq: Four times a day (QID) | ORAL | Status: DC | PRN
  Administered 2023-01-07 – 2023-01-08 (×2): 650 mg via ORAL
  Filled 2023-01-07 (×2): qty 2

## 2023-01-07 MED ORDER — LACTATED RINGERS IV SOLN: INTRAVENOUS | Status: DC

## 2023-01-07 MED ORDER — GABAPENTIN 100 MG PO CAPS: 200.0000 mg | ORAL_CAPSULE | Freq: Three times a day (TID) | ORAL | Status: DC

## 2023-01-07 MED ORDER — GABAPENTIN 600 MG PO TABS: 600.0000 mg | ORAL_TABLET | Freq: Three times a day (TID) | ORAL | Status: DC

## 2023-01-07 MED ORDER — NORTRIPTYLINE HCL 25 MG PO CAPS
50.0000 mg | ORAL_CAPSULE | Freq: Every day | ORAL | Status: DC
  Administered 2023-01-07 – 2023-01-08 (×2): 50 mg via ORAL
  Filled 2023-01-07 (×2): qty 2

## 2023-01-07 MED ORDER — ACETAMINOPHEN 650 MG RE SUPP: 650.0000 mg | Freq: Four times a day (QID) | RECTAL | Status: DC | PRN

## 2023-01-07 MED ORDER — CYCLOBENZAPRINE HCL 10 MG PO TABS: 5.0000 mg | ORAL_TABLET | Freq: Three times a day (TID) | ORAL | Status: DC | PRN

## 2023-01-07 MED ORDER — CLOPIDOGREL BISULFATE 75 MG PO TABS
75.0000 mg | ORAL_TABLET | Freq: Every day | ORAL | Status: DC
  Administered 2023-01-07 – 2023-01-09 (×3): 75 mg via ORAL
  Filled 2023-01-07 (×3): qty 1

## 2023-01-07 MED ORDER — FLUTICASONE PROPIONATE 50 MCG/ACT NA SUSP: 2.0000 | Freq: Every day | NASAL | Status: DC | PRN

## 2023-01-07 MED ORDER — ONDANSETRON HCL 4 MG/2ML IJ SOLN: 4.0000 mg | Freq: Four times a day (QID) | INTRAMUSCULAR | Status: DC | PRN

## 2023-01-07 MED ORDER — INSULIN GLARGINE-YFGN 100 UNIT/ML ~~LOC~~ SOLN
15.0000 [IU] | Freq: Every day | SUBCUTANEOUS | Status: DC
  Administered 2023-01-07 – 2023-01-08 (×2): 15 [IU] via SUBCUTANEOUS
  Filled 2023-01-07 (×3): qty 0.15

## 2023-01-07 MED ORDER — ATORVASTATIN CALCIUM 20 MG PO TABS
20.0000 mg | ORAL_TABLET | Freq: Every day | ORAL | Status: DC
  Administered 2023-01-07 – 2023-01-09 (×3): 20 mg via ORAL
  Filled 2023-01-07 (×3): qty 1

## 2023-01-07 MED ORDER — SODIUM CHLORIDE 0.9 % IV SOLN: 2.0000 g | Freq: Once | INTRAVENOUS | Status: DC

## 2023-01-07 MED ORDER — SODIUM CHLORIDE 0.9 % IV BOLUS
1000.0000 mL | Freq: Once | INTRAVENOUS | Status: AC
  Administered 2023-01-07: 1000 mL via INTRAVENOUS

## 2023-01-07 MED ORDER — INSULIN ASPART 100 UNIT/ML IJ SOLN
0.0000 [IU] | Freq: Three times a day (TID) | INTRAMUSCULAR | Status: DC
  Administered 2023-01-07 – 2023-01-08 (×2): 2 [IU] via SUBCUTANEOUS
  Administered 2023-01-08 – 2023-01-09 (×4): 3 [IU] via SUBCUTANEOUS
  Filled 2023-01-07 (×6): qty 1

## 2023-01-07 MED ORDER — SODIUM CHLORIDE 0.9 % IV SOLN
1.0000 g | Freq: Once | INTRAVENOUS | Status: AC
  Administered 2023-01-08: 1 g via INTRAVENOUS
  Filled 2023-01-07 (×2): qty 10

## 2023-01-07 MED ORDER — ONDANSETRON HCL 4 MG PO TABS: 4.0000 mg | ORAL_TABLET | Freq: Four times a day (QID) | ORAL | Status: DC | PRN

## 2023-01-07 NOTE — ED Provider Notes (Signed)
[facility] Provider Note    Event Date/Time   First MD Initiated Contact with Patient 01/07/23 1032     (approximate)   History   Dizziness   HPI  Gabriella Marsh is a 57 y.o. female   Past medical history of diabetes and diabetic neuropathy, hypertension, hyperlipidemia, presents to the emergency department with low blood pressure.  She states that she gets lightheaded with her rehab, mostly bedbound, making good progress at rehab where she is recovering from a femoral shaft fracture status post fixation.  She has been on multiple antihypertensives and noted that her blood pressure which normally runs in the 93Z systolic has been low to the 16R systolic over the last week or 2.  During this time she becomes lightheaded with exertion.  She denies chest pain or shortness of breath.  On review of systems, she has no respiratory infectious symptoms and has baseline urinary spasms but no dysuria or changes.  No fevers or chills.  She denies bleeding.  She takes multiple pain medications.  She told her doctor this past week about her symptoms and low blood pressure after which she was advised to stop taking 2 of the antihypertensives and remains on lisinopril only.  She was previously on diuretic for peripheral edema as well which she stopped in the last several days.  She has had no history of PE or DVT and is on antiplatelet only.  She has some pain behind her knees bilaterally but these are subacute/chronic.  She has no chest pain or dyspnea.   External Medical Documents Reviewed: Urology note from earlier today which documents multiple blood pressure medications furosemide, triamterene, lisinopril and patient report of discontinuing furosemide and triamterene by PCP recommendations.      Physical Exam   Triage Vital Signs: ED Triage Vitals [01/07/23 1028]  Enc Vitals Group     BP      Pulse      Resp      Temp      Temp src      SpO2       Weight      Height      Head Circumference      Peak Flow      Pain Score 7     Pain Loc      Pain Edu?      Excl. in [place]?     Most recent vital signs: Vitals:   01/07/23 1130 01/07/23 1200  BP: 90/60 104/65  Pulse: 70 72  Resp: (!) 23 13  Temp:    SpO2: 98% 100%    General: Awake, no distress.  CV:  Good peripheral perfusion.  Resp:  Normal effort.  Abd:  No distention.  Other:  Ranted pleasant.  Blood pressure was low initially 75/53, no significant peripheral edema, lungs clear to auscultation without wheezing or focalities, abdomen soft and nontender, mucous membranes slightly dry looks a bit dehydrated.  Radial pulses intact bilaterally heart sounds normal   ED Results / Procedures / Treatments   Labs (all labs ordered are listed, but only abnormal results are displayed) Labs Reviewed  CBC - Abnormal; Notable for the following components:      Result Value   WBC 13.5 (*)    All other components within normal limits  URINALYSIS, ROUTINE W REFLEX MICROSCOPIC - Abnormal; Notable for the following components:   Color, Urine YELLOW (*)    APPearance HAZY (*)    Protein, ur  30 (*)    Leukocytes,Ua LARGE (*)    Bacteria, UA RARE (*)    All other components within normal limits  COMPREHENSIVE METABOLIC PANEL - Abnormal; Notable for the following components:   Potassium 3.4 (*)    Chloride 97 (*)    Glucose, Bld 169 (*)    Creatinine, Ser 1.06 (*)    Calcium 8.2 (*)    AST 57 (*)    ALT 76 (*)    All other components within normal limits  LACTIC ACID, PLASMA - Abnormal; Notable for the following components:   Lactic Acid, Venous 4.1 (*)    All other components within normal limits  URINE DRUG SCREEN, QUALITATIVE ([facility] ONLY) - Abnormal; Notable for the following components:   Tricyclic, Ur Screen POSITIVE (*)    All other components within normal limits  TSH - Abnormal; Notable for the following components:   TSH 166.000 (*)    All other components within normal  limits  T4, FREE - Abnormal; Notable for the following components:   Free T4 0.28 (*)    All other components within normal limits  CULTURE, BLOOD (ROUTINE X 2)  CULTURE, BLOOD (ROUTINE X 2)  URINE CULTURE  D-DIMER, QUANTITATIVE  LACTIC ACID, PLASMA  CBG MONITORING, ED  POC URINE PREG, ED  TROPONIN I (HIGH SENSITIVITY)  TROPONIN I (HIGH SENSITIVITY)     I ordered and reviewed the above labs they are notable for hemoglobin is 13.  EKG  ED ECG REPORT I, Lucillie Garfinkel, the attending physician, personally viewed and interpreted this ECG.   Date: 01/07/2023  EKG Time: 1039  Rate: 89  Rhythm: nsr  Axis: nl  Intervals:none  ST&T Change: No acute ischemic changes.  Low voltage.     PROCEDURES:  Critical Care performed: Yes, see critical care procedure note(s)  .Critical Care  Performed by: Lucillie Garfinkel, MD Authorized by: Lucillie Garfinkel, MD   Critical care provider statement:    Critical care time (minutes):  30   Critical care was necessary to treat or prevent imminent or life-threatening deterioration of the following conditions:  Sepsis   Critical care was time spent personally by me on the following activities:  Development of treatment plan with patient or surrogate, discussions with consultants, evaluation of patient's response to treatment, examination of patient, ordering and review of laboratory studies, ordering and review of radiographic studies, ordering and performing treatments and interventions, pulse oximetry, re-evaluation of patient's condition and review of old charts    MEDICATIONS ORDERED IN ED: Medications  cefTRIAXone (ROCEPHIN) 1 g in sodium chloride 0.9 % 100 mL IVPB (has no administration in time range)  sodium chloride 0.9 % bolus 1,000 mL (has no administration in time range)  sodium chloride 0.9 % bolus 1,000 mL (0 mLs Intravenous Stopped 01/07/23 1140)    External physician / consultants:  I spoke with hospitliast for admission & regarding care  plan for this patient.   IMPRESSION / MDM / ASSESSMENT AND PLAN / ED COURSE  I reviewed the triage vital signs and the nursing notes.                                Patient's presentation is most consistent with acute presentation with potential threat to life or bodily function.  Differential diagnosis includes, but is not limited to, hypotension due to medication effects, dehydration, infection, PE or ACS, blood loss, cardiogenic, thyroid dysfunction  The patient is on the cardiac monitor to evaluate for evidence of arrhythmia and/or significant heart rate changes.  MDM: This is a patient with symptomatic hypotension most likely from overmedication with her antihypertensives.  She has since stopped and remains on lisinopril looks a little bit dehydrated as well already responsive to 1 L of fluids now close to her baseline  with repeat blood pressure 96/63.  Considered other more emergent causes of hypotension including infection, thyroid dysfunction, PE, ACS but less likely given no symptoms consistent with prior.  She has had some subacute or chronic bilateral lower extremity pain and is higher risk for blood clots given her bedbound status and will check DVT scans and D-dimer.  Check EKG and troponin for cardiac/ACS.  Fortunately bedside ReSound shows no pericardial effusion, good systolic function, no right heart strain, and an IVC that is nearly fully collapsible with inspiration that is most consistent with dehydration.  There is no free fluid in the hepatorenal space and her abdomen soft and nontender with normal hemoglobin so I doubt this is blood loss.   Came back with a lactic acidosis above 4 and evidence of UTI w catheter in place will treat given hypotension white blood cell count lactic acidosis and potential source of infection urinary tract infection will complete sepsis fluid bolus 30 cc/kg ideal body weight as well as ceftriaxone through the IV to cover for urinary tract  infection and admit to the hospitalist service.  Her thyroid Free T4 was low, continue Synthroid while inpatient and continue to track.          FINAL CLINICAL IMPRESSION(S) / ED DIAGNOSES   Final diagnoses:  Urinary tract infection without hematuria, site unspecified  Sepsis, due to unspecified organism, unspecified whether acute organ dysfunction present [facility])     Rx / DC Orders   ED Discharge Orders     None        Note:  This document was prepared using Dragon voice recognition software and may include unintentional dictation errors.    Lucillie Garfinkel, MD 01/07/23 1257

## 2023-01-07 NOTE — Assessment & Plan Note (Signed)
Patient has a history of neurogenic bladder with a chronic indwelling Foley catheter Will need to have Foley catheter changed during this hospitalization

## 2023-01-07 NOTE — ED Notes (Signed)
Pt given crackers. Pt husband at bedside.

## 2023-01-07 NOTE — Assessment & Plan Note (Signed)
Chronic Continue gabapentin

## 2023-01-07 NOTE — Assessment & Plan Note (Signed)
Continue oxycodone 

## 2023-01-07 NOTE — Assessment & Plan Note (Signed)
Secondary to ATN from hypotension most likely related to sepsis as well as medication induced Patient has a baseline serum creatinine of 0.84 and it is 1.06 on admission today Continue aggressive IV fluid resuscitation Repeat renal parameters in a.m.

## 2023-01-07 NOTE — ED Notes (Signed)
MD at bedside at this time.

## 2023-01-07 NOTE — ED Notes (Signed)
Patient's foley catheter replaced at this time due to prior catheter being placed over 30 days ago.

## 2023-01-07 NOTE — ED Notes (Signed)
Pt states she is a diabetic and takes insulin. No orders for diabetic protocol. MD Agbata notified.

## 2023-01-07 NOTE — ED Triage Notes (Signed)
Pt comes with c/o dizziness, cramping in legs and low BP. Pt states urologist sent her here.

## 2023-01-07 NOTE — Assessment & Plan Note (Signed)
Secondary to diuretic use Supplement potassium Check magnesium levels 

## 2023-01-07 NOTE — ED Notes (Signed)
US at bedside

## 2023-01-07 NOTE — Assessment & Plan Note (Signed)
Complicates overall prognosis and care ?Lifestyle modification and exercise has been discussed with patient in detail ?

## 2023-01-07 NOTE — Assessment & Plan Note (Signed)
Continue PPI

## 2023-01-07 NOTE — ED Notes (Signed)
Per MD Agbata give patient 553m bolus LR.

## 2023-01-07 NOTE — ED Notes (Signed)
Pt did not receive dinner meal tray. RN called dietary to have tray delivered.

## 2023-01-07 NOTE — Assessment & Plan Note (Addendum)
In a patient with neurogenic bladder and chronic indwelling Foley catheter who presents to the ER for evaluation of hypotension that responded to IV fluid resuscitation, marked leukocytosis and lactic acidosis with pyuria and complications of acute kidney injury secondary to ATN from hypotension Prior urine culture yielded Enterobacter [place] sensitive to cephalosporins Patient received a dose of IV Rocephin in the ER Continue aggressive IV fluid resuscitation Trend lactic acid levels Place patient on Rocephin 1 g IV daily until urine culture results become available

## 2023-01-07 NOTE — ED Triage Notes (Signed)
Arrives from Urology for ED evaluation of hypotension.  Per report, patient recently decreased her blood pressure medication and then took lasix this morning for lower urine output.  SBP:  60's

## 2023-01-07 NOTE — H&P (Addendum)
History and Physical    Patient: Gabriella Marsh ZOX:[id number] DOB: July 21, 1966 DOA: 01/07/2023 DOS: the patient was seen and examined on 01/07/2023 PCP: Gabriella Sprout, FNP  Patient coming from: Home  Chief Complaint:  Chief Complaint  Patient presents with   Dizziness   HPI: Gabriella Marsh is a 57 y.o. female with medical history significant for diabetic ischemic lumbosacral plexopathy with bilateral lower extremity weakness and numbness (Left > Right), status post right femur fracture, wheelchair-bound, poorly controlled diabetes mellitus, status post thyroidectomy with hypothyroidism, depression, obesity, neurogenic bladder status post chronic indwelling Foley catheter who was sent to the ER from the urologist office for evaluation of hypotension. Patient states that she has felt very weak over the last several days.  She usually gets outpatient physical therapy and her last session was 1 day prior to admission.  She noted she was very weak and unable to participate fully in the physical therapy session.  She denies any falls or any loss of consciousness.  She denies feeling dizzy or lightheaded. Per patient she has been on triamterene and furosemide and that was recently discontinued about a week ago by her primary care provider due to hypotension which was documented during an appointment at the pain clinic.  She took 1 tablet of furosemide 20 mg the day prior to her admission because of decreased urine output. She had gone for scheduled urology appointment for her monthly Foley catheter exchange and was sent to the ER because she was hypotensive. She complains of constipation and feeling cold but denies having any fever, no chills, no nausea, no hematuria, no vomiting, no headache, no palpitations, no diaphoresis, no recent falls, no leg swelling, no blurred vision or focal deficit. Upon arrival to the ER she had a systolic blood pressure of 64 and received 1 L IV fluid bolus  with improvement in her blood pressure.  Second liter of fluid is still pending Abnormal labs include TSH 166, free T40.28, potassium 3.4, creatinine 1.06 compared to baseline of 0.84, AST 57, ALT 76, lactic acid 4.1, white count 13.5 Patient has pyuria She received a dose of Rocephin 1 g IV and is scheduled to receive 2 L of IV fluid based on (IBW of 67kg) She will be admitted to the hospital for presumed sepsis from a urinary source.     Review of Systems: As mentioned in the history of present illness. All other systems reviewed and are negative. Past Medical History:  Diagnosis Date   Arthritis    knees   Blood clotting disorder ([place])    on toe    Degenerative disc disease, lumbar    Diabetes mellitus without complication (HCC)    GERD (gastroesophageal reflux disease)    Headache    daily - AM (has not been able to have SPG blocks lately)   Hyperlipidemia    Hypertension    Neuropathy    feet   Vertigo    3-4x/yr   Past Surgical History:  Procedure Laterality Date   ABDOMINAL HYSTERECTOMY     But still has cervix   AMPUTATION TOE Right 07/24/2021   Procedure: AMPUTATION TOE;  Surgeon: Gabriella Marsh, DPM;  Location: [facility] ORS;  Service: Podiatry;  Laterality: Right;   CESAREAN SECTION     CHOLECYSTECTOMY     COLONOSCOPY WITH PROPOFOL N/A 02/20/2022   Procedure: COLONOSCOPY WITH PROPOFOL;  Surgeon: Gabriella Bellows, MD;  Location: [facility] ENDOSCOPY;  Service: Gastroenterology;  Laterality: N/A;   left arm frature  10/12/2020   OVARIAN CYST SURGERY     PARATHYROIDECTOMY  03/28/2021   Procedure: PARATHYROIDECTOMY AUTOTRANSPLANT;  Surgeon: Gabriella Maudlin, MD;  Location: [facility] ORS;  Service: General;;   right femur surgery Right    SHOULDER SURGERY Left 12/26/014   Dr. Little Marsh, [place] N/A 03/28/2021   Procedure: THYROIDECTOMY, total;  Surgeon: Gabriella Maudlin, MD;  Location: [facility] ORS;  Service: General;  Laterality: N/A;  Provider requesting 3 hours /180 minutes  for procedure   TONSILLECTOMY AND ADENOIDECTOMY     UTERINE FIBROID SURGERY     Social History:  reports that she quit smoking about 11 years ago. Her smoking use included cigarettes. She has never used smokeless tobacco. She reports that she does not drink alcohol and does not use drugs.  Allergies  Allergen Reactions   Celecoxib Shortness Of Breath   Pregabalin Shortness Of Breath   Buspar [Buspirone] Other (See Comments)    Tachycardia   Citalopram Cough   Other Cough    Gabriella Marsh    Family History  Problem Relation Age of Onset   Diabetes Father    Heart disease Father    Hypertension Father    Hyperlipidemia Father    Congestive Heart Failure Father    Healthy Brother    Osteoporosis Mother    Irritable bowel syndrome Mother    Osteoporosis Maternal Grandmother     Prior to Admission medications   Medication Sig Start Date End Date Taking? Authorizing Provider  atorvastatin (LIPITOR) 20 MG tablet TAKE 1 TABLET(20 MG) BY MOUTH DAILY 10/20/22   Gabriella Joe T, FNP  clopidogrel (PLAVIX) 75 MG tablet TAKE 1 TABLET(75 MG) BY MOUTH DAILY 12/29/22   Gabriella Sprout, FNP  cyclobenzaprine (FLEXERIL) 5 MG tablet Take 1 tablet (5 mg total) by mouth 3 (three) times daily as needed for muscle spasms. 12/29/22   Gabriella Sprout, FNP  Dulaglutide (TRULICITY) 3 UY/4.0HK SOPN Inject 3 mg as directed once a week. 11/30/22   Gabriella Sprout, FNP  fluticasone (FLONASE) 50 MCG/ACT nasal spray Place 2 sprays into both nostrils daily. 01/09/22   Gabriella Sprout, FNP  furosemide (LASIX) 20 MG tablet TAKE 2 TABLETS(40 MG) BY MOUTH DAILY AS NEEDED FOR FLUID RETENTION OR SWELLING 11/25/21   Gabriella Joe T, FNP  gabapentin (NEURONTIN) 100 MG capsule TAKE 2 CAPSULES THREE TIMES A DAY ALONG WITH 600 MG THREE TIMES A DAY FOR A TOTAL DAILY DOSE OF 2400 MG DAILY 07/17/21   Gabriella Crews, MD  gabapentin (NEURONTIN) 600 MG tablet TAKE 1 TABLET BY MOUTH 3 TIMES  DAILY 06/18/22   Gabriella Joe T, FNP   Insulin Pen Needle 31G X 5 MM MISC 1 each by Does not apply route daily. 01/27/22   Gabriella Sprout, FNP  LANTUS SOLOSTAR 100 UNIT/ML Solostar Pen INJECT 20 UNITS UNDER THE SKIN AT BEDTIME, TITRATE TO FASTING SUGAR OF 130-180 AS DIRECTED 11/02/22   Gabriella Sprout, FNP  levothyroxine (SYNTHROID) 25 MCG tablet Take 1 tablet (25 mcg total) by mouth daily. Please take WITH your 200 mcg dose. Recommend labs in 6-8 weeks. 09/13/22   Gabriella Sprout, FNP  lisinopril (ZESTRIL) 5 MG tablet TAKE 1 TABLET(5 MG) BY MOUTH DAILY 10/12/22   Gabriella Joe T, FNP  Melatonin 10 MG TABS Take 10 mg by mouth at bedtime.    [provider]  metFORMIN (GLUCOPHAGE) 500 MG tablet Take 2 tablets (1,000 mg total) by mouth 2 (two) times  daily with a meal. 12/16/21   Gabriella Joe T, FNP  nortriptyline (PAMELOR) 25 MG capsule TAKE 2 CAPSULES BY MOUTH AT  BEDTIME 09/09/22   Gabriella Joe T, FNP  omeprazole (PRILOSEC) 20 MG capsule TAKE 1 CAPSULE(20 MG) BY MOUTH DAILY 12/29/22   Gabriella Joe T, FNP  oxyCODONE-acetaminophen (PERCOCET) 5-325 MG tablet Take 1 tablet by mouth every 8 (eight) hours. Must last 30 days. 01/09/23 02/08/23  Gillis Santa, MD  oxyCODONE-acetaminophen (PERCOCET) 5-325 MG tablet Take 1 tablet by mouth every 8 (eight) hours. Must last 30 days. 02/08/23 03/10/23  Gillis Santa, MD  oxyCODONE-acetaminophen (PERCOCET) 5-325 MG tablet Take 1 tablet by mouth every 8 (eight) hours. Must last 30 days. 03/10/23 04/09/23  Gillis Santa, MD  traZODone (DESYREL) 50 MG tablet Take 2 tablets (100 mg total) by mouth at bedtime. 09/10/22   Gabriella Sprout, FNP    Physical Exam: Vitals:   01/07/23 1200 01/07/23 1300 01/07/23 1315 01/07/23 1345  BP: 104/65 109/68 116/72   Pulse: 72 73 70 71  Resp: '13 12 11 13  '$ Temp:      TempSrc:      SpO2: 100% 92% 100% 100%   Physical Exam Vitals and nursing note reviewed.  Constitutional:      Appearance: She is obese.     Comments: Chronically ill-appearing  HENT:     Head:  Normocephalic and atraumatic.     Nose: Nose normal.     Mouth/Throat:     Mouth: Mucous membranes are dry.  Eyes:     Conjunctiva/sclera: Conjunctivae normal.  Cardiovascular:     Rate and Rhythm: Normal rate and regular rhythm.  Pulmonary:     Effort: Pulmonary effort is normal.     Breath sounds: Normal breath sounds.  Abdominal:     General: Bowel sounds are normal.     Comments: Central adiposity  Genitourinary:    Comments: Foley catheter in place Musculoskeletal:        General: Normal range of motion.     Cervical back: Normal range of motion and neck supple.  Skin:    General: Skin is warm and dry.  Neurological:     Mental Status: She is alert and oriented to person, place, and time.     Motor: Weakness present.  Psychiatric:        Mood and Affect: Mood normal.        Behavior: Behavior normal.     Data Reviewed: Relevant notes from primary care and specialist visits, past discharge summaries as available in EHR, including Care Everywhere. Prior diagnostic testing as pertinent to current admission diagnoses Updated medications and problem lists for reconciliation ED course, including vitals, labs, imaging, treatment and response to treatment Triage notes, nursing and pharmacy notes and ED provider's notes Notable results as noted in HPI Labs reviewed.  TSH 166, free T40.28, urine drug screen is positive for tricyclic's, sodium 188, potassium 3.4, chloride 97, bicarb 24, glucose 169, BUN 18, creatinine 1.06 compared to baseline of 0.84, calcium 8.2, total protein 7.3, albumin 4.0, AST 57, ALT 76, alkaline phosphatase 55, total bilirubin 1.2, lactic acid 4.1, D-dimer 0.34, white count 13.5, hemoglobin 13.0, hematocrit 39.8, platelet count 391 Chest x-ray reviewed by me shows no evidence of acute cardiopulmonary disease Right lower extremity venous Doppler shows no evidence of femoropopliteal DVT or superficial thrombophlebitis within either lower extremity. There are  no new results to review at this time.  Assessment and Plan: * Sepsis secondary to UTI (  [place]) In a patient with neurogenic bladder and chronic indwelling Foley catheter who presents to the ER for evaluation of hypotension that responded to IV fluid resuscitation, marked leukocytosis and lactic acidosis with pyuria and complications of acute kidney injury secondary to ATN from hypotension Prior urine culture yielded Enterobacter [place] sensitive to cephalosporins Patient received a dose of IV Rocephin in the ER Continue aggressive IV fluid resuscitation Trend lactic acid levels Place patient on Rocephin 1 g IV daily until urine culture results become available  AKI (acute kidney injury) ([place]) Secondary to ATN from hypotension most likely related to sepsis as well as medication induced Patient has a baseline serum creatinine of 0.84 and it is 1.06 on admission today Continue aggressive IV fluid resuscitation Repeat renal parameters in a.m.   Chronic sensorimotor polyneuropathy with axonal and demyelinating features Chronic Continue gabapentin    Type 2 diabetes mellitus with peripheral neuropathy ([place]) Patient has diabetes mellitus type 2 with peripheral neuropathy Continue consistent carbohydrate diet Continue long acting insulin with sliding scale coverage     Neurogenic bladder Patient has a history of neurogenic bladder with a chronic indwelling Foley catheter Will need to have Foley catheter changed during this hospitalization  Hypokalemia Secondary to diuretic use Supplement potassium Check magnesium levels  Hypothyroidism Acquired hypothyroidism following thyroidectomy ??  Compliance as patient has significant elevated TSH levels and low free Marsh T4 levels Continue Synthroid 225 mcg daily Repeat TSH in 6 to 8 weeks  Chronic pain syndrome Continue oxycodone  GERD (gastroesophageal reflux disease) Continue PPI  Obesity Complicates overall prognosis and  care Lifestyle modification and exercise has been discussed with patient in detail      Advance Care Planning:   Code Status: Full Code   Consults: None  Family Communication: Greater then 50% of time was spent discussing patient's condition and plan of care with her at the bedside.  All questions and concerns have been addressed.  She verbalizes understanding and agrees with the plan.  Severity of Illness: The appropriate patient status for this patient is INPATIENT. Inpatient status is judged to be reasonable and necessary in order to provide the required intensity of service to ensure the patient's safety. The patient's presenting symptoms, physical exam findings, and initial radiographic and laboratory data in the context of their chronic comorbidities is felt to place them at high risk for further clinical deterioration. Furthermore, it is not anticipated that the patient will be medically stable for discharge from the hospital within 2 midnights of admission.   * I certify that at the point of admission it is my clinical judgment that the patient will require inpatient hospital care spanning beyond 2 midnights from the point of admission due to high intensity of service, high risk for further deterioration and high frequency of surveillance required.*  Author: Collier Bullock, MD 01/07/2023 3:13 PM  For on call review [email or web address].

## 2023-01-07 NOTE — ED Notes (Signed)
Leg bag switched out for standard foley bag at this time.

## 2023-01-07 NOTE — ED Notes (Signed)
Assumed care from [facility]. Pt resting comfortably in bed at this time. Pt denies any current needs or questions. Call light with in reach.  Family at bedside.

## 2023-01-07 NOTE — Assessment & Plan Note (Signed)
Patient has diabetes mellitus type 2 with peripheral neuropathy Continue consistent carbohydrate diet Continue long acting insulin with sliding scale coverage

## 2023-01-07 NOTE — Assessment & Plan Note (Signed)
Acquired hypothyroidism following thyroidectomy ??  Compliance as patient has significant elevated TSH levels and low free T T4 levels Continue Synthroid 225 mcg daily Repeat TSH in 6 to 8 weeks

## 2023-01-07 NOTE — ED Notes (Signed)
MD Agbata notified of patient lactic increase.

## 2023-01-07 NOTE — ED Notes (Addendum)
Disregard note.

## 2023-01-08 ENCOUNTER — Encounter: Payer: Self-pay | Admitting: Internal Medicine

## 2023-01-08 DIAGNOSIS — I959 Hypotension, unspecified: Secondary | ICD-10-CM | POA: Diagnosis present

## 2023-01-08 DIAGNOSIS — A419 Sepsis, unspecified organism: Secondary | ICD-10-CM | POA: Diagnosis not present

## 2023-01-08 DIAGNOSIS — N390 Urinary tract infection, site not specified: Secondary | ICD-10-CM | POA: Diagnosis not present

## 2023-01-08 LAB — GLUCOSE, CAPILLARY
Glucose-Capillary: 161 mg/dL — ABNORMAL HIGH (ref 70–99)
Glucose-Capillary: 143 mg/dL — ABNORMAL HIGH (ref 70–99)
Glucose-Capillary: 146 mg/dL — ABNORMAL HIGH (ref 70–99)

## 2023-01-08 LAB — BASIC METABOLIC PANEL
Anion gap: 8 (ref 5–15)
BUN: 8 mg/dL (ref 6–20)
CO2: 27 mmol/L (ref 22–32)
Calcium: 7.8 mg/dL — ABNORMAL LOW (ref 8.9–10.3)
Chloride: 99 mmol/L (ref 98–111)
Creatinine, Ser: 0.7 mg/dL (ref 0.44–1.00)
GFR, Estimated: >60 mL/min (ref >60)
Glucose, Bld: 140 mg/dL — ABNORMAL HIGH (ref 70–99)
Potassium: 3.8 mmol/L (ref 3.5–5.1)
Sodium: 134 mmol/L — ABNORMAL LOW (ref 135–145)

## 2023-01-08 LAB — CBG MONITORING, ED: Glucose-Capillary: 152 mg/dL — ABNORMAL HIGH (ref 70–99)

## 2023-01-08 LAB — CBC
HCT: 38.1 % (ref 36.0–46.0)
Hemoglobin: 12.8 g/dL (ref 12.0–15.0)
MCH: 29.4 pg (ref 26.0–34.0)
MCHC: 33.6 g/dL (ref 30.0–36.0)
MCV: 87.6 fL (ref 80.0–100.0)
Platelets: 343 10*3/uL (ref 150–400)
RBC: 4.35 MIL/uL (ref 3.87–5.11)
RDW: 14.4 % (ref 11.5–15.5)
WBC: 7.4 10*3/uL (ref 4.0–10.5)
nRBC: 0 % (ref 0.0–0.2)

## 2023-01-08 LAB — PROCALCITONIN: Procalcitonin: 0.55 ng/mL

## 2023-01-08 LAB — CORTISOL-AM, BLOOD: Cortisol - AM: 10.7 ug/dL (ref 6.7–22.6)

## 2023-01-08 LAB — PROTIME-INR
INR: 1 (ref 0.8–1.2)
Prothrombin Time: 13.4 seconds (ref 11.4–15.2)

## 2023-01-08 MED ORDER — OXYCODONE-ACETAMINOPHEN 5-325 MG PO TABS: 1.0000 | ORAL_TABLET | Freq: Three times a day (TID) | ORAL | Status: DC | PRN

## 2023-01-08 NOTE — Plan of Care (Signed)
  Problem: Respiratory: Goal: Ability to maintain adequate ventilation will improve Outcome: Progressing   Problem: Fluid Volume: Goal: Ability to maintain a balanced intake and output will improve Outcome: Progressing   Problem: Skin Integrity: Goal: Risk for impaired skin integrity will decrease Outcome: Progressing   Problem: Clinical Measurements: Goal: Ability to maintain clinical measurements within normal limits will improve Outcome: Progressing   Problem: Education: Goal: Knowledge of General Education information will improve Description: Including pain rating scale, medication(s)/side effects and non-pharmacologic comfort measures Outcome: Progressing

## 2023-01-08 NOTE — Progress Notes (Signed)
  PROGRESS NOTE    Gabriella Marsh  [id number]:[id number] DOB: 12-02-1966 DOA: 01/07/2023 PCP: [name], FNP  139A/139A-AA  LOS: 1 day   Brief hospital course:   Assessment & Plan: Gabriella Marsh is a 57 y.o. female with medical history significant for diabetic ischemic lumbosacral plexopathy with bilateral lower extremity weakness and numbness (Left > Right), status post right femur fracture, wheelchair-bound, poorly controlled diabetes mellitus, status post thyroidectomy with hypothyroidism, depression, obesity, neurogenic bladder status post chronic indwelling Foley catheter who was sent to the ER from the urologist office for evaluation of hypotension.    UTI [facility]) Neurogenic bladder with Chronic Foley In a patient with neurogenic bladder and chronic indwelling Foley catheter who presents to the ER for evaluation of hypotension that responded to IV fluid resuscitation, leukocytosis and lactic acidosis with pyuria. --started on ceftriaxone on presentation --Foley exchanged after presentation. Plan: --cont ceftriaxone pending urine cx  AKI (acute kidney injury) ([place]) Secondary to ATN from hypotension. Patient has a baseline serum creatinine of 0.84 and it is 1.06 on admission  --Cr improved with IVF --cont MIVF  Chronic sensorimotor polyneuropathy with axonal and demyelinating features Chronic Continue gabapentin  Type 2 diabetes mellitus with peripheral neuropathy (HCC) --cont glargine 15u nightly --ACHS and SSI  Hypokalemia --monitor and replete PRN  Hypothyroidism Acquired hypothyroidism following thyroidectomy ??  Compliance as TSH 166, and free T4 0.28 on presentation. Continue Synthroid 225 mcg daily Repeat TSH in 6 to 8 weeks  Chronic pain syndrome Continue home Percocet  GERD (gastroesophageal reflux disease) Continue PPI  Obesity Complicates overall prognosis and care Lifestyle modification and exercise has been discussed with patient in  detail by admitting physician.  Sepsis, ruled out --did not meet criteria.  Only had leukocytosis.   DVT prophylaxis: Lovenox SQ Code Status: Full code  Family Communication:  Level of care: Med-Surg Dispo:   The patient is from: home Anticipated d/c is to: home Anticipated d/c date is: likely tomorrow   Subjective and Interval History:  Pt reported feeling better.   BP improved after receiving IVF.   Objective: Vitals:   01/08/23 1020 01/08/23 1022 01/08/23 1030 01/08/23 1150  BP:  107/67  123/75  Pulse: 77 77  76  Resp: '14 15  19  '$ Temp:    97.7 F (36.5 C)  TempSrc:      SpO2: 98% 97%  99%  Height:   '5\' 10"'$  (1.778 m)     Intake/Output Summary (Last 24 hours) at 01/08/2023 1428 Last data filed at 01/08/2023 1418 Gross per 24 hour  Intake 1340 ml  Output 650 ml  Net 690 ml   There were no vitals filed for this visit.  Examination:   Constitutional: NAD, AAOx3 HEENT: conjunctivae and lids normal, EOMI CV: No cyanosis.   RESP: normal respiratory effort, on RA Extremities: No effusions, edema in BLE SKIN: warm, dry Neuro: II - XII grossly intact.   Psych: Normal mood and affect.  Appropriate judgement and reason Foley present   Data Reviewed: I have personally reviewed labs and imaging studies  Time spent: 50 minutes  Enzo Bi, MD [facility] If 7PM-7AM, please contact night-coverage 01/08/2023, 2:28 PM

## 2023-01-09 DIAGNOSIS — A419 Sepsis, unspecified organism: Secondary | ICD-10-CM | POA: Diagnosis not present

## 2023-01-09 DIAGNOSIS — N390 Urinary tract infection, site not specified: Secondary | ICD-10-CM | POA: Diagnosis not present

## 2023-01-09 LAB — BASIC METABOLIC PANEL
Anion gap: 10 (ref 5–15)
BUN: 7 mg/dL (ref 6–20)
CO2: 27 mmol/L (ref 22–32)
Calcium: 8.3 mg/dL — ABNORMAL LOW (ref 8.9–10.3)
Chloride: 99 mmol/L (ref 98–111)
Creatinine, Ser: 0.61 mg/dL (ref 0.44–1.00)
GFR, Estimated: >60 mL/min (ref >60)
Glucose, Bld: 146 mg/dL — ABNORMAL HIGH (ref 70–99)
Potassium: 3.5 mmol/L (ref 3.5–5.1)
Sodium: 136 mmol/L (ref 135–145)

## 2023-01-09 LAB — CBC
HCT: 36.7 % (ref 36.0–46.0)
Hemoglobin: 12.4 g/dL (ref 12.0–15.0)
MCH: 29.2 pg (ref 26.0–34.0)
MCHC: 33.8 g/dL (ref 30.0–36.0)
MCV: 86.4 fL (ref 80.0–100.0)
Platelets: 368 10*3/uL (ref 150–400)
RBC: 4.25 MIL/uL (ref 3.87–5.11)
RDW: 14.6 % (ref 11.5–15.5)
WBC: 7.3 10*3/uL (ref 4.0–10.5)
nRBC: 0 % (ref 0.0–0.2)

## 2023-01-09 LAB — GLUCOSE, CAPILLARY
Glucose-Capillary: 139 mg/dL — ABNORMAL HIGH (ref 70–99)
Glucose-Capillary: 176 mg/dL — ABNORMAL HIGH (ref 70–99)

## 2023-01-09 LAB — MAGNESIUM: Magnesium: 1.8 mg/dL (ref 1.7–2.4)

## 2023-01-09 MED ORDER — SODIUM CHLORIDE 0.9 % IV SOLN
1.0000 g | Freq: Once | INTRAVENOUS | Status: AC
  Administered 2023-01-09: 1 g via INTRAVENOUS
  Filled 2023-01-09: qty 1

## 2023-01-09 MED ORDER — CHLORHEXIDINE GLUCONATE CLOTH 2 % EX PADS
6.0000 | MEDICATED_PAD | Freq: Every day | CUTANEOUS | Status: DC
  Administered 2023-01-09: 6 via TOPICAL

## 2023-01-09 NOTE — Plan of Care (Signed)

## 2023-01-09 NOTE — Plan of Care (Signed)
  Problem: Fluid Volume: Goal: Hemodynamic stability will improve Outcome: Adequate for Discharge   Problem: Clinical Measurements: Goal: Diagnostic test results will improve Outcome: Adequate for Discharge   Problem: Clinical Measurements: Goal: Signs and symptoms of infection will decrease Outcome: Adequate for Discharge   Problem: Respiratory: Goal: Ability to maintain adequate ventilation will improve Outcome: Adequate for Discharge   Problem: Health Behavior/Discharge Planning: Goal: Ability to identify and utilize available resources and services will improve Outcome: Adequate for Discharge   Problem: Fluid Volume: Goal: Ability to maintain a balanced intake and output will improve Outcome: Adequate for Discharge   Problem: Coping: Goal: Ability to adjust to condition or change in health will improve Outcome: Adequate for Discharge   Problem: Tissue Perfusion: Goal: Adequacy of tissue perfusion will improve Outcome: Adequate for Discharge   Problem: Education: Goal: Knowledge of General Education information will improve Description: Including pain rating scale, medication(s)/side effects and non-pharmacologic comfort measures Outcome: Adequate for Discharge

## 2023-01-09 NOTE — Progress Notes (Signed)
Pt discharged from hospital with belongings bag and discharge paperwork. Discharge instructions provided by nurse and IV taken out. Pt wheel downed and transportation provided via husband.

## 2023-01-09 NOTE — Discharge Summary (Incomplete)
Physician Discharge Summary   Gabriella Marsh  female DOB: July 21, 1966  SHF:[id number]  PCP: [name], FNP  Admit date: 01/07/2023 Discharge date: 01/09/2023  Admitted From: home Disposition:  home CODE STATUS: Full code   Hospital Course:  For full details, please see H&P, progress notes, consult notes and ancillary notes.  Briefly,  Gabriella Marsh is a 57 y.o. female with medical history significant for diabetic ischemic lumbosacral plexopathy with bilateral lower extremity weakness and numbness (Left > Right), right femur fracture, wheelchair-bound, poorly controlled diabetes mellitus, thyroidectomy with hypothyroidism, neurogenic bladder status post chronic indwelling Foley catheter who was sent to the ER from the urologist office for evaluation of hypotension.    Hypotension --BP responded to IVF.   --Home lasix and lisinopril d/c'ed.  UTI [facility]), POA Neurogenic bladder with Chronic Foley Pt had leukocytosis and lactic acidosis with pyuria, and was started treatment for presumed UTI given pt presented with chronic Foley and hypotension. --Foley exchanged after presentation. --pt received 3 days of ceftriaxone to complete the course. --Per pt, chronic Foley was not started for urinary retention, rather for urinary incontinence, and pt did not want it removed.  Pt has monthly urology f/u for it.     AKI (acute kidney injury) ([place]) Secondary to ATN from hypotension. --Cr 1.06 on presentation, improved to 0.61 after IVF.   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   Hypokalemia --monitored and repleted PRN   Hypothyroidism Acquired hypothyroidism following thyroidectomy. Question compliance as TSH 166, and free T4 0.28 on presentation. Continue Synthroid 225 mcg daily Repeat TSH in 6 to 8  weeks   Chronic pain syndrome Continue home Percocet   GERD (gastroesophageal reflux disease) Continue PPI   Obesity Complicates overall prognosis and care Lifestyle modification and exercise has been discussed with patient in detail by admitting physician.   Sepsis, ruled out --did not meet criteria.  Only had leukocytosis.    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 The score is based on dignosis, age, lab data, medications, orders, and past utilization.   Low:  0-14.9   Medium: 15-21.9   High: 22-29.9   Extreme: 30 and above         Discharge Instructions:  Allergies as of 01/09/2023       Reactions   Celecoxib Shortness Of Breath   Pregabalin Shortness Of Breath   Buspar [buspirone] Other (See Comments)   Tachycardia   Citalopram Cough   Other Cough   [place] pear trees        Medication List     STOP taking these medications    furosemide 20 MG tablet Commonly  known as: LASIX   lisinopril 5 MG tablet Commonly known as: ZESTRIL       TAKE these medications    atorvastatin 20 MG tablet Commonly known as: LIPITOR TAKE 1 TABLET(20 MG) BY MOUTH DAILY   clopidogrel 75 MG tablet Commonly known as: PLAVIX TAKE 1 TABLET(75 MG) BY MOUTH  DAILY   cyclobenzaprine 5 MG tablet Commonly known as: FLEXERIL Take 1 tablet (5 mg total) by mouth 3 (three) times daily as needed for muscle spasms.   fluticasone 50 MCG/ACT nasal spray Commonly known as: FLONASE Place 2 sprays into both nostrils daily.   gabapentin 100 MG capsule Commonly known as: NEURONTIN TAKE 2 CAPSULES THREE TIMES A DAY ALONG WITH 600 MG THREE TIMES A DAY FOR A TOTAL DAILY DOSE OF 2400 MG DAILY   gabapentin 600 MG tablet Commonly known as: NEURONTIN TAKE 1 TABLET BY MOUTH 3 TIMES  DAILY   Lantus SoloStar 100 UNIT/ML Solostar Pen Generic drug: insulin glargine INJECT 20 UNITS UNDER THE SKIN AT BEDTIME, TITRATE TO FASTING SUGAR OF 130-180 AS DIRECTED What changed: See the new instructions.   levothyroxine 200 MCG tablet Commonly known as: SYNTHROID Take 200 mcg by mouth daily before breakfast.   levothyroxine 25 MCG tablet Commonly known as: SYNTHROID Take 1 tablet (25 mcg total) by mouth daily. Please take WITH your 200 mcg dose. Recommend labs in 6-8 weeks.   Melatonin 10 MG Tabs Take 10 mg by mouth at bedtime.   metFORMIN 500 MG tablet Commonly known as: GLUCOPHAGE Take 2 tablets (1,000 mg total) by mouth 2 (two) times daily with a meal. What changed: when to take this   nortriptyline 25 MG capsule Commonly known as: PAMELOR TAKE 2 CAPSULES BY MOUTH AT  BEDTIME   omeprazole 20 MG capsule Commonly known as: PRILOSEC TAKE 1 CAPSULE(20 MG) BY MOUTH DAILY   oxyCODONE-acetaminophen 5-325 MG tablet Commonly known as: Percocet Take 1 tablet by mouth every 8 (eight) hours. Must last 30 days.   oxyCODONE-acetaminophen 5-325 MG tablet Commonly known as: Percocet Take 1 tablet by mouth every 8 (eight) hours. Must last 30 days. Start taking on: February 08, 2023   oxyCODONE-acetaminophen 5-325 MG tablet Commonly known as: Percocet Take 1 tablet by mouth every 8 (eight) hours. Must last 30 days. Start taking on: March 10, 2023   traZODone 50  MG tablet Commonly known as: DESYREL Take 2 tablets (100 mg total) by mouth at bedtime.   Trulicity 3 JJ/0.0XF Sopn Generic drug: Dulaglutide Inject 3 mg as directed once a week.         Follow-up Information     Tally Joe T, FNP Follow up in 1 week(s).   Specialty: Family Medicine Contact information: [place] [address] [phone]                 Allergies  Allergen Reactions   Celecoxib Shortness Of Breath   Pregabalin Shortness Of Breath   Buspar [Buspirone] Other (See Comments)    Tachycardia   Citalopram Cough   Other Cough    [place] pear trees     The results of significant diagnostics from this hospitalization (including imaging, microbiology, ancillary and laboratory) are listed below for reference.   Consultations:   Procedures/Studies: US Venous Img Lower Bilateral  Result Date: 01/07/2023 CLINICAL DATA:  pain EXAM: BILATERAL LOWER EXTREMITY VENOUS DOPPLER ULTRASOUND TECHNIQUE: Gray-scale sonography with graded compression, as well as color Doppler and duplex ultrasound were performed to evaluate the lower  extremity deep venous systems from the level of the common femoral vein and including the common femoral, femoral, profunda femoral, popliteal and calf veins including the posterior tibial, peroneal and gastrocnemius veins when visible. The superficial great saphenous vein was also interrogated. Spectral Doppler was utilized to evaluate flow at rest and with distal augmentation maneuvers in the common femoral, femoral and popliteal veins. COMPARISON:  LEFT lower extremity venous duplex, 05/27/2022. FINDINGS: RIGHT LOWER EXTREMITY VENOUS Normal compressibility of the RIGHT common femoral, superficial femoral, and popliteal veins, as well as the visualized calf veins. Visualized portions of profunda femoral vein and great saphenous vein unremarkable. No filling defects to suggest DVT on grayscale or color Doppler imaging.  Doppler waveforms show normal direction of venous flow, normal respiratory plasticity and response to augmentation. OTHER No evidence of superficial thrombophlebitis or abnormal fluid collection. Limitations: Patient body habitus LEFT LOWER EXTREMITY VENOUS Normal compressibility of the LEFT common femoral, superficial femoral, and popliteal veins, as well as the visualized calf veins. Visualized portions of profunda femoral vein and great saphenous vein unremarkable. No filling defects to suggest DVT on grayscale or color Doppler imaging. Doppler waveforms show normal direction of venous flow, normal respiratory plasticity and response to augmentation. OTHER No evidence of superficial thrombophlebitis or abnormal fluid collection. Limitations: Patient body habitus IMPRESSION: No evidence of femoropopliteal DVT or superficial thrombophlebitis within either lower extremity. Michaelle Birks, MD [facility] Radiology Electronically Signed   By: Michaelle Birks M.D.   On: 01/07/2023 12:23   DG Chest Port 1 View  Result Date: 01/07/2023 CLINICAL DATA:  Hypotension. EXAM: PORTABLE CHEST 1 VIEW COMPARISON:  07/22/2021 FINDINGS: The lungs are clear without focal pneumonia, edema, pneumothorax or pleural effusion. The cardiopericardial silhouette is within normal limits for size. The visualized bony structures of the thorax are unremarkable. Telemetry leads overlie the chest. IMPRESSION: No active disease. Electronically Signed   By: Misty Stanley M.D.   On: 01/07/2023 11:21      Labs: BNP (last 3 results) No results for input(s): "BNP" in the last 8760 hours. Basic Metabolic Panel: Recent Labs  Lab 01/07/23 1041 01/08/23 0720 01/09/23 0445  NA 135 134* 136  K 3.4* 3.8 3.5  CL 97* 99 99  CO2 '24 27 27  '$ GLUCOSE 169* 140* 146*  BUN '18 8 7  '$ CREATININE 1.06* 0.70 0.61  CALCIUM 8.2* 7.8* 8.3*  MG 1.8  --  1.8   Liver Function Tests: Recent Labs  Lab  01/07/23 1041  AST 57*  ALT 76*  ALKPHOS 55  BILITOT 1.2  PROT 7.3  ALBUMIN 4.0   No results for input(s): "LIPASE", "AMYLASE" in the last 168 hours. No results for input(s): "AMMONIA" in the last 168 hours. CBC: Recent Labs  Lab 01/07/23 1041 01/08/23 0720 01/09/23 0445  WBC 13.5* 7.4 7.3  HGB 13.0 12.8 12.4  HCT 39.8 38.1 36.7  MCV 88.4 87.6 86.4  PLT 391 343 368   Cardiac Enzymes: No results for input(s): "CKTOTAL", "CKMB", "CKMBINDEX", "TROPONINI" in the last 168 hours. BNP: Invalid input(s): "POCBNP" CBG: Recent Labs  Lab 01/08/23 0807 01/08/23 1204 01/08/23 1616 01/08/23 2126 01/09/23 0743  GLUCAP 152* 161* 143* 146* 139*   D-Dimer Recent Labs    01/07/23 1041  DDIMER 0.34   Hgb A1c No results for input(s): "HGBA1C" in the last 72 hours. Lipid Profile No results for input(s): "CHOL", "HDL", "LDLCALC", "TRIG", "CHOLHDL", "LDLDIRECT" in the last 72 hours. Thyroid function studies Recent Labs  01/07/23 1041  TSH 166.000*   Anemia work up No results for input(s): "VITAMINB12", "FOLATE", "FERRITIN", "TIBC", "IRON", "RETICCTPCT" in the last 72 hours. Urinalysis    Component Value Date/Time   COLORURINE YELLOW (A) 01/07/2023 1041   APPEARANCEUR HAZY (A) 01/07/2023 1041   LABSPEC 1.021 01/07/2023 1041   PHURINE 5.0 01/07/2023 1041   GLUCOSEU NEGATIVE 01/07/2023 1041   HGBUR NEGATIVE 01/07/2023 [place] 01/07/2023 1041   BILIRUBINUR Negative 05/22/2020 0839   KETONESUR NEGATIVE 01/07/2023 1041   PROTEINUR 30 (A) 01/07/2023 1041   UROBILINOGEN 0.2 05/22/2020 0839   NITRITE NEGATIVE 01/07/2023 1041   LEUKOCYTESUR LARGE (A) 01/07/2023 1041   Sepsis Labs Recent Labs  Lab 01/07/23 1041 01/08/23 0720 01/09/23 0445  WBC 13.5* 7.4 7.3   Microbiology Recent Results (from the past 240 hour(s))  Urine Culture     Status: Abnormal (Preliminary result)   Collection Time: 01/07/23 10:41 AM   Specimen: Urine, Random  Result Value  Ref Range Status   Specimen Description   Final    URINE, RANDOM Performed at [facility], [address] [place], [place] [address]    Special Requests   Final    NONE Performed at [facility], [address] [place], [place] [address]    Culture (A)  Final    >=100,000 COLONIES/mL GRAM NEGATIVE RODS IDENTIFICATION AND SUSCEPTIBILITIES TO FOLLOW CULTURE REINCUBATED FOR BETTER GROWTH Performed at [facility], [place] [address] [place], [place] [address]    Report Status PENDING  Incomplete  Blood Culture (routine x 2)     Status: None (Preliminary result)   Collection Time: 01/07/23 12:53 PM   Specimen: BLOOD  Result Value Ref Range Status   Specimen Description BLOOD LEFT ANTECUBITAL  Final   Special Requests   Final    BOTTLES DRAWN AEROBIC AND ANAEROBIC Blood Culture results may not be optimal due to an inadequate volume of blood received in culture bottles   Culture   Final    NO GROWTH 2 DAYS Performed at [facility], [address], [place], [place] [address]    Report Status PENDING  Incomplete  Blood Culture (routine x 2)     Status: None (Preliminary result)   Collection Time: 01/07/23 12:58 PM   Specimen: BLOOD  Result Value Ref Range Status   Specimen Description BLOOD BLOOD LEFT FOREARM  Final   Special Requests   Final    BOTTLES DRAWN AEROBIC AND ANAEROBIC Blood Culture adequate volume   Culture   Final    NO GROWTH 2 DAYS Performed at [facility], [address] [place], [place] [address]    Report Status PENDING  Incomplete     Total time spend on discharging this patient, including the last patient exam, discussing the hospital stay, instructions for ongoing care as it relates to all pertinent caregivers, as well as preparing the medical discharge records, prescriptions, and/or referrals as applicable, is 35 minutes.    Enzo Bi, MD  [facility] 01/09/2023, 10:58 AM

## 2023-01-10 ENCOUNTER — Telehealth: Payer: Self-pay | Admitting: Urology

## 2023-01-10 LAB — URINE CULTURE: Culture: >=100000 — AB

## 2023-01-10 NOTE — Telephone Encounter (Signed)
We need to get Gabriella Marsh back in for a voiding trial in the next few weeks.

## 2023-01-11 ENCOUNTER — Telehealth: Payer: Self-pay

## 2023-01-11 ENCOUNTER — Encounter: Payer: BLUE CROSS/BLUE SHIELD | Admitting: Physical Therapy

## 2023-01-11 NOTE — Telephone Encounter (Signed)
Transition Care Management Follow-up Telephone Call Date of discharge and from where: [place] 1/202/2024 How have you been since you were released from the hospital? better Any questions or concerns? Yes  Items Reviewed: Did the pt receive and understand the discharge instructions provided? Yes  Medications obtained and verified? Yes  Other? No  Any new allergies since your discharge? No  Dietary orders reviewed? Yes Do you have support at home? Yes   Home Care and Equipment/Supplies: Were home health services ordered? no If so, what is the name of the agency? N/a  Has the agency set up a time to come to the patient's home? no Were any new equipment or medical supplies ordered?  No What is the name of the medical supply agency? N/a Were you able to get the supplies/equipment? no Do you have any questions related to the use of the equipment or supplies? No  Functional Questionnaire: (I = Independent and D = Dependent) ADLs: I  Bathing/Dressing- I  Meal Prep- I  Eating- I  Maintaining continence- I  Transferring/Ambulation- I  Managing Meds- I  Follow up appointments reviewed:  PCP Hospital f/u appt confirmed? No  Patient declined appt [facility] f/u appt confirmed? No  Are transportation arrangements needed? No  If their condition worsens, is the pt aware to call PCP or go to the Emergency Dept.? Yes Was the patient provided with contact information for the PCP's office or ED? Yes Was to pt encouraged to call back with questions or concerns? Yes Juanda Crumble, LPN [place] Direct Dial [phone]

## 2023-01-11 NOTE — Telephone Encounter (Signed)
Pt aware.   Appt made 2/13 830 and 330.

## 2023-01-12 LAB — CULTURE, BLOOD (ROUTINE X 2)
Culture: NO GROWTH
Culture: NO GROWTH
Special Requests: ADEQUATE

## 2023-01-13 ENCOUNTER — Ambulatory Visit: Payer: BC Managed Care – PPO | Admitting: Physical Therapy

## 2023-01-14 ENCOUNTER — Telehealth (INDEPENDENT_AMBULATORY_CARE_PROVIDER_SITE_OTHER): Payer: BC Managed Care – PPO | Admitting: Physician Assistant

## 2023-01-14 ENCOUNTER — Encounter: Payer: Self-pay | Admitting: Physician Assistant

## 2023-01-14 DIAGNOSIS — L299 Pruritus, unspecified: Secondary | ICD-10-CM | POA: Diagnosis not present

## 2023-01-14 MED ORDER — HYDROXYZINE PAMOATE 50 MG PO CAPS: 50.0000 mg | ORAL_CAPSULE | Freq: Three times a day (TID) | ORAL | 0 refills | Status: DC | PRN

## 2023-01-14 MED ORDER — HYDROCORTISONE 1 % EX LOTN: 1.0000 | TOPICAL_LOTION | Freq: Two times a day (BID) | CUTANEOUS | 0 refills | Status: DC

## 2023-01-14 NOTE — Progress Notes (Signed)
MyChart Video Visit    Virtual Visit via Video Note   This format is felt to be most appropriate for this patient at this time. Physical exam was limited by quality of the video and audio technology used for the visit.   Patient location: home Provider location: [facility]  I discussed the limitations of evaluation and management by telemedicine and the availability of in person appointments. The patient expressed understanding and agreed to proceed.  Patient: Gabriella Marsh   DOB: 28-Dec-1965   57 y.o. Female  MRN: [id number] Visit Date: 01/14/2023  Today's healthcare provider: Mikey Kirschner, PA-C   Cc. Itching x 6 days  Subjective    HPI   Pt was in the ED 1/18 for UTI/sepsis and hypotension. Reports not being given her usual pain medications -- percocet. The day she was discharged reports feeling itching on her arms, legs, abdomen. She re-started her opioid 6 days ago. Since then the itching has not improved. Denies rashes, lesions, new medications. Denies SOB, wheezing, throat swelling.  Medications: Outpatient Medications Prior to Visit  Medication Sig   atorvastatin (LIPITOR) 20 MG tablet TAKE 1 TABLET(20 MG) BY MOUTH DAILY   clopidogrel (PLAVIX) 75 MG tablet TAKE 1 TABLET(75 MG) BY MOUTH DAILY   cyclobenzaprine (FLEXERIL) 5 MG tablet Take 1 tablet (5 mg total) by mouth 3 (three) times daily as needed for muscle spasms.   Dulaglutide (TRULICITY) 3 HF/0.2OV SOPN Inject 3 mg as directed once a week.   fluticasone (FLONASE) 50 MCG/ACT nasal spray Place 2 sprays into both nostrils daily.   gabapentin (NEURONTIN) 100 MG capsule TAKE 2 CAPSULES THREE TIMES A DAY ALONG WITH 600 MG THREE TIMES A DAY FOR A TOTAL DAILY DOSE OF 2400 MG DAILY   gabapentin (NEURONTIN) 600 MG tablet TAKE 1 TABLET BY MOUTH 3 TIMES  DAILY   LANTUS SOLOSTAR 100 UNIT/ML Solostar Pen INJECT 20 UNITS UNDER THE SKIN AT BEDTIME, TITRATE TO FASTING SUGAR OF 130-180 AS DIRECTED (Patient  taking differently: 24 Units at bedtime.)   levothyroxine (SYNTHROID) 200 MCG tablet Take 200 mcg by mouth daily before breakfast.   levothyroxine (SYNTHROID) 25 MCG tablet Take 1 tablet (25 mcg total) by mouth daily. Please take WITH your 200 mcg dose. Recommend labs in 6-8 weeks.   Melatonin 10 MG TABS Take 10 mg by mouth at bedtime.   metFORMIN (GLUCOPHAGE) 500 MG tablet Take 2 tablets (1,000 mg total) by mouth 2 (two) times daily with a meal. (Patient taking differently: Take 1,000 mg by mouth daily with breakfast.)   nortriptyline (PAMELOR) 25 MG capsule TAKE 2 CAPSULES BY MOUTH AT  BEDTIME   omeprazole (PRILOSEC) 20 MG capsule TAKE 1 CAPSULE(20 MG) BY MOUTH DAILY   oxyCODONE-acetaminophen (PERCOCET) 5-325 MG tablet Take 1 tablet by mouth every 8 (eight) hours. Must last 30 days.   [START ON 02/08/2023] oxyCODONE-acetaminophen (PERCOCET) 5-325 MG tablet Take 1 tablet by mouth every 8 (eight) hours. Must last 30 days.   [START ON 03/10/2023] oxyCODONE-acetaminophen (PERCOCET) 5-325 MG tablet Take 1 tablet by mouth every 8 (eight) hours. Must last 30 days.   traZODone (DESYREL) 50 MG tablet Take 2 tablets (100 mg total) by mouth at bedtime.   No facility-administered medications prior to visit.     Objective    There were no vitals taken for this visit.   Physical Exam Skin:    Comments: Able to see linear excoriation marks on b/l forearms  Assessment & Plan     Pruritus Will treat symptomatically -- hydroxyzine tid prn itching, rx topical hydrocortisone.  Return if symptoms worsen or fail to improve--f/u with pcp.     I discussed the assessment and treatment plan with the patient. The patient was provided an opportunity to ask questions and all were answered. The patient agreed with the plan and demonstrated an understanding of the instructions.   The patient was advised to call back or seek an in-person evaluation if the symptoms worsen or if the condition fails to  improve as anticipated.  I provided 15 minutes of non-face-to-face time during this encounter.  I, Mikey Kirschner, PA-C have reviewed all documentation for this visit. The documentation on  01/14/23 for the exam, diagnosis, procedures, and orders are all accurate and complete.  Mikey Kirschner, PA-C [facility] [address] [place], [place], [address] Office: [phone] Fax: [place]

## 2023-01-18 ENCOUNTER — Other Ambulatory Visit: Payer: Self-pay | Admitting: Family Medicine

## 2023-01-18 DIAGNOSIS — K219 Gastro-esophageal reflux disease without esophagitis: Secondary | ICD-10-CM

## 2023-01-18 MED ORDER — OMEPRAZOLE 20 MG PO CPDR: DELAYED_RELEASE_CAPSULE | ORAL | 0 refills | Status: DC

## 2023-01-19 ENCOUNTER — Encounter: Payer: BLUE CROSS/BLUE SHIELD | Admitting: Physical Therapy

## 2023-01-21 ENCOUNTER — Ambulatory Visit: Payer: BC Managed Care – PPO | Admitting: Physical Therapy

## 2023-01-21 DIAGNOSIS — I959 Hypotension, unspecified: Secondary | ICD-10-CM | POA: Diagnosis not present

## 2023-01-21 DIAGNOSIS — R29898 Other symptoms and signs involving the musculoskeletal system: Secondary | ICD-10-CM | POA: Diagnosis not present

## 2023-01-21 DIAGNOSIS — R32 Unspecified urinary incontinence: Secondary | ICD-10-CM | POA: Diagnosis not present

## 2023-01-21 DIAGNOSIS — E1144 Type 2 diabetes mellitus with diabetic amyotrophy: Secondary | ICD-10-CM | POA: Diagnosis not present

## 2023-01-21 IMAGING — MR MR THORACIC SPINE W/O CM
6 series · 32 of 48 positions shown · non-contrast
Comparison: Thoracic spine radiographs 04/02/2020

CLINICAL DATA: Mid back pain. Left lower extremity weakness.

EXAM:
MRI THORACIC SPINE WITHOUT CONTRAST
TECHNIQUE: Multiplanar, multisequence MR imaging of the thoracic spine was
performed. No intravenous contrast was administered.

[Series 16: T1 · sagittal · 5.0mm · 1.88mm/px · 4 of 9 slices shown (1 of 2)]
[im 1/9]
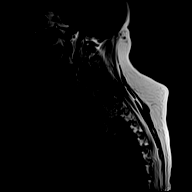
[im 3/9]
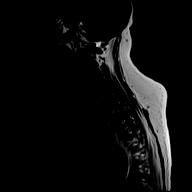
[im 6/9]
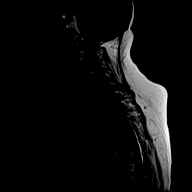
[im 9/9]
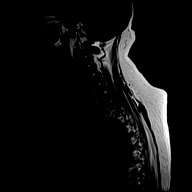

[Series 17: T2 · sagittal · 3.0mm · 1.06mm/px · 6 of 19 slices shown (1 of 2)]
[im 1/19]
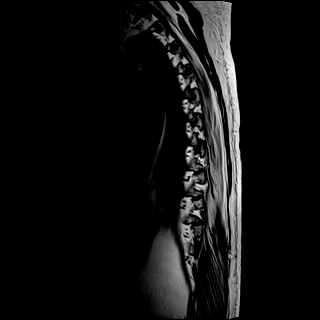
[im 4/19]
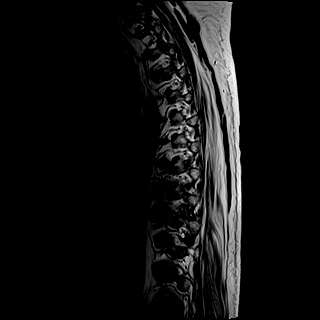
[im 8/19]
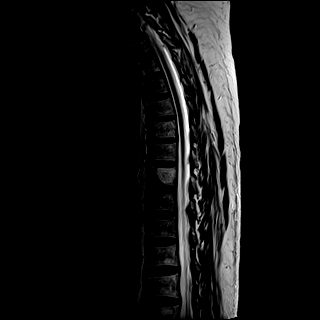
[im 11/19]
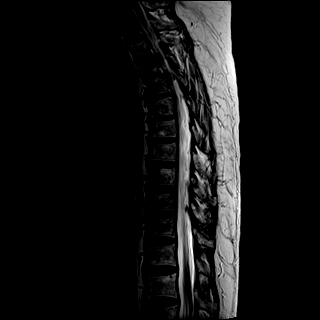
[im 15/19]
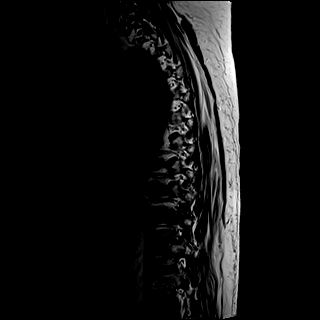
[im 19/19]
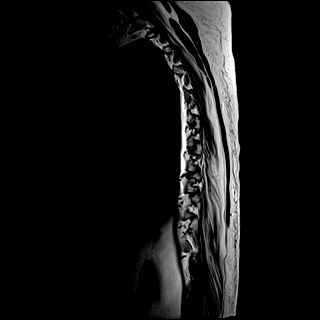

[Series 18: T1 · sagittal · 3.0mm · 1.06mm/px · 6 of 19 slices shown (2 of 2)]
[im 1/19]
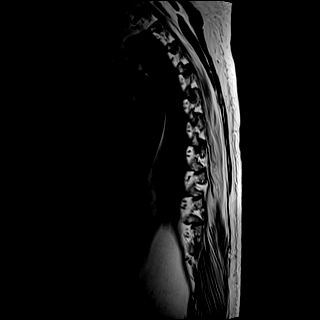
[im 4/19]
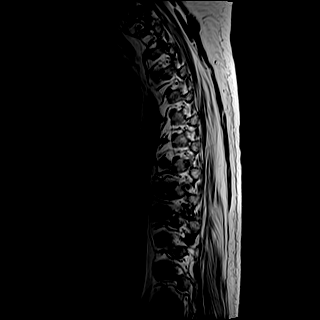
[im 8/19]
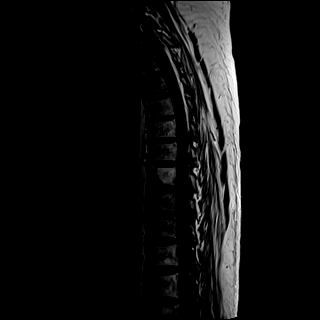
[im 11/19]
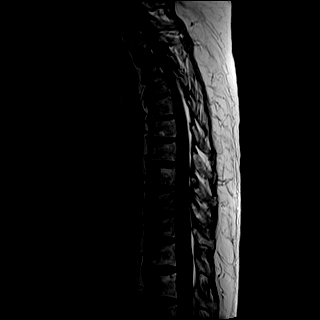
[im 15/19]
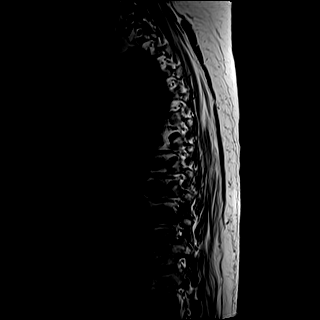
[im 19/19]
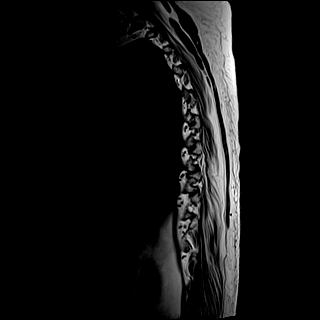

[Series 19: STIR · sagittal · 3.0mm · 0.53mm/px · 6 of 19 slices shown]
[im 1/19]
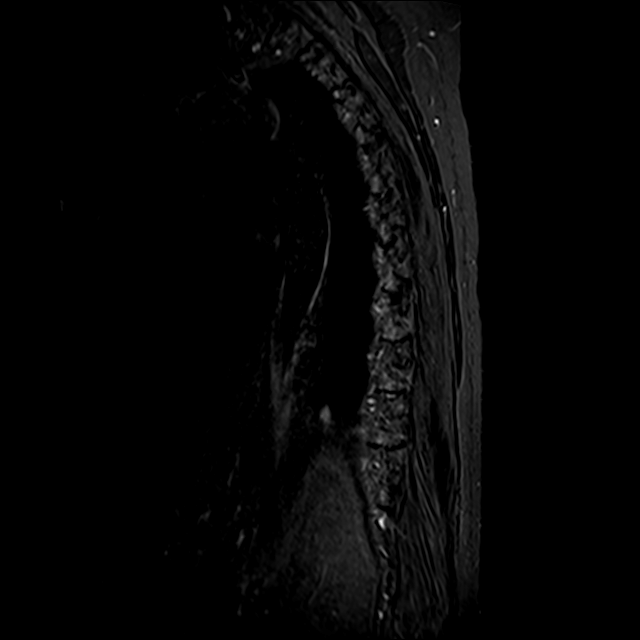
[im 4/19]
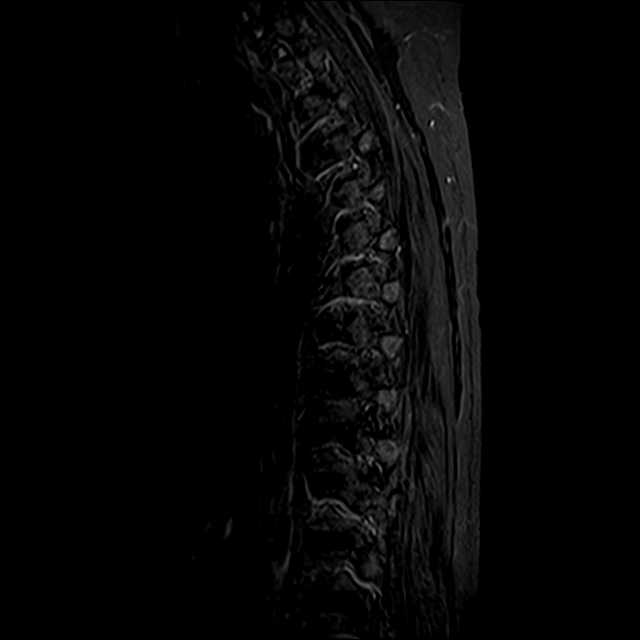
[im 8/19]
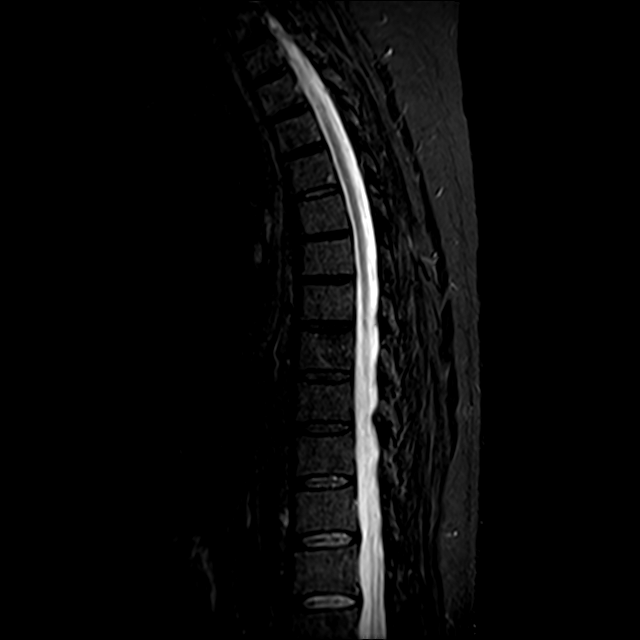
[im 11/19]
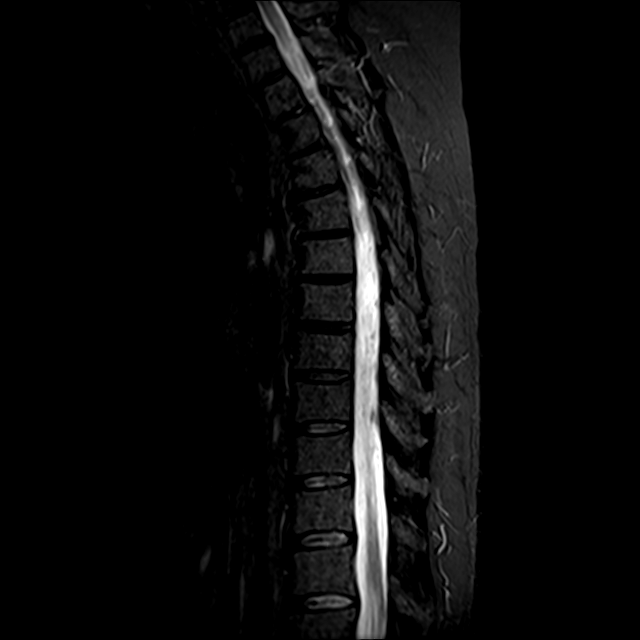
[im 15/19]
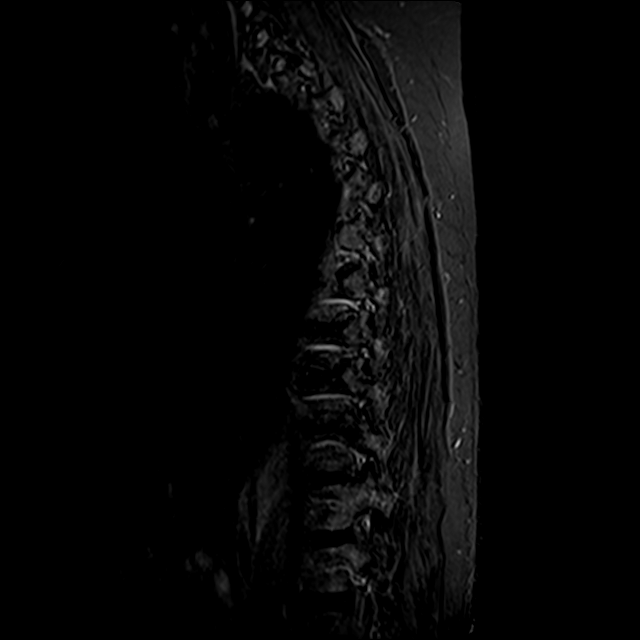
[im 19/19]
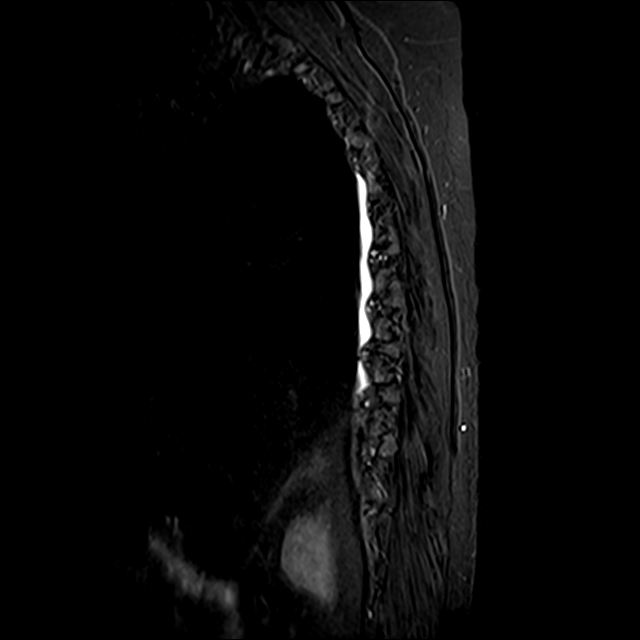

[Series 20: T2 · axial · 4.0mm · 0.59mm/px · z∈[-389,-135]mm · 9 of 39 slices shown (2 of 2)]
[im 1/39]
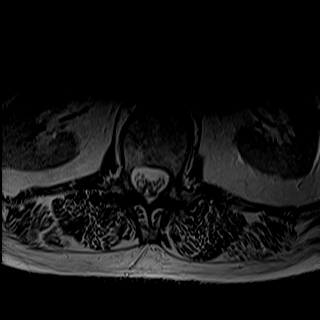
[im 7/39]
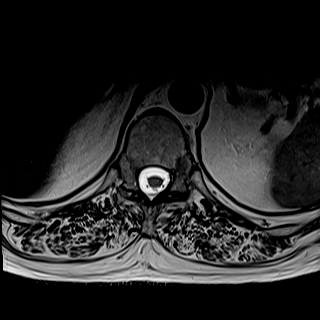
[im 13/39]
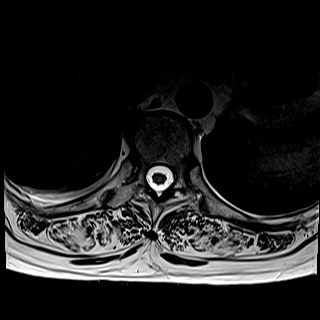
[im 16/39]
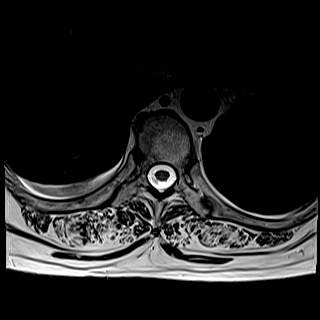
[im 20/39]
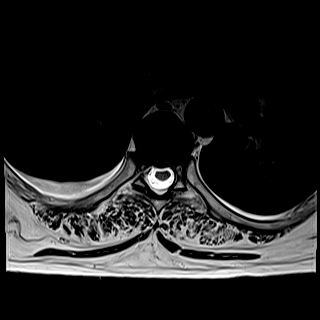
[im 23/39]
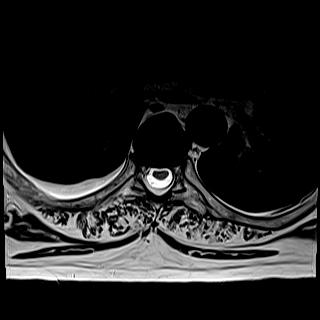
[im 26/39]
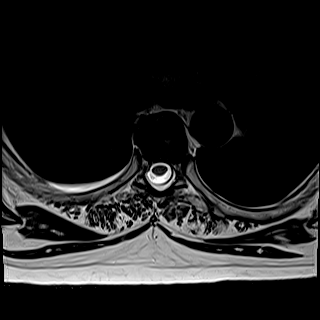
[im 32/39]
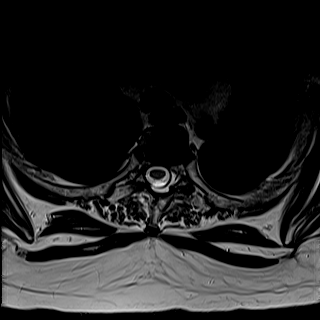
[im 39/39]
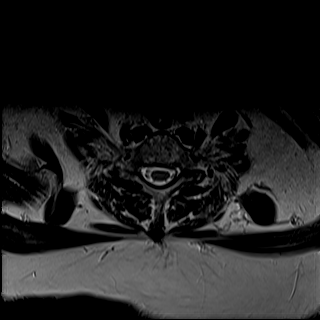

[Series 21: GRE · axial · 4.0mm · 0.37mm/px · 1 of 39 slices shown]
[im 1/39]
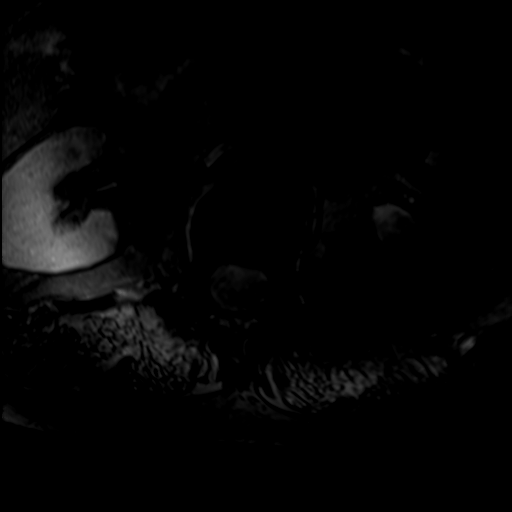

[32 of 48 positions shown; findings below may reference images not displayed]

FINDINGS: Alignment: No significant listhesis is present. Mild leftward
curvature is again noted in the upper thoracic spine.

Vertebrae: 2 cm hemangioma is present at T8. 11 mm hemangioma is
noted inferiorly at T10. Hemangiomas are present at T12. Marrow
signal and vertebral body heights are otherwise normal.

Cord:  Normal signal and morphology.

Paraspinal and other soft tissues: A small right pleural effusion is
present. Minimal atelectasis is present. Mediastinum is
unremarkable. Paraspinous musculature demonstrates mild atrophy.

Disc levels:

Shallow central disc protrusions are present at C5-6 and T6-7
without significant stenosis. Facet hypertrophy contributes to
moderate right and mild left foraminal narrowing at T9-10. Foramina
are otherwise patent.
IMPRESSION: 1. Moderate right and mild left foraminal stenosis at T9-10
secondary to facet hypertrophy.
2. Shallow central disc protrusions at C5-6 and T6-7 without
significant stenosis.
3. Multiple hemangiomas in the thoracic spine.
4. Small right pleural effusion.

## 2023-01-21 IMAGING — CR DG LUMBAR SPINE 2-3V
3 series · 3 of 3 positions shown · non-contrast
Comparison: 11/12/2021

CLINICAL DATA: Fell.  Back pain.

EXAM:
LUMBAR SPINE - 2-3 VIEW

[l-spine ap]
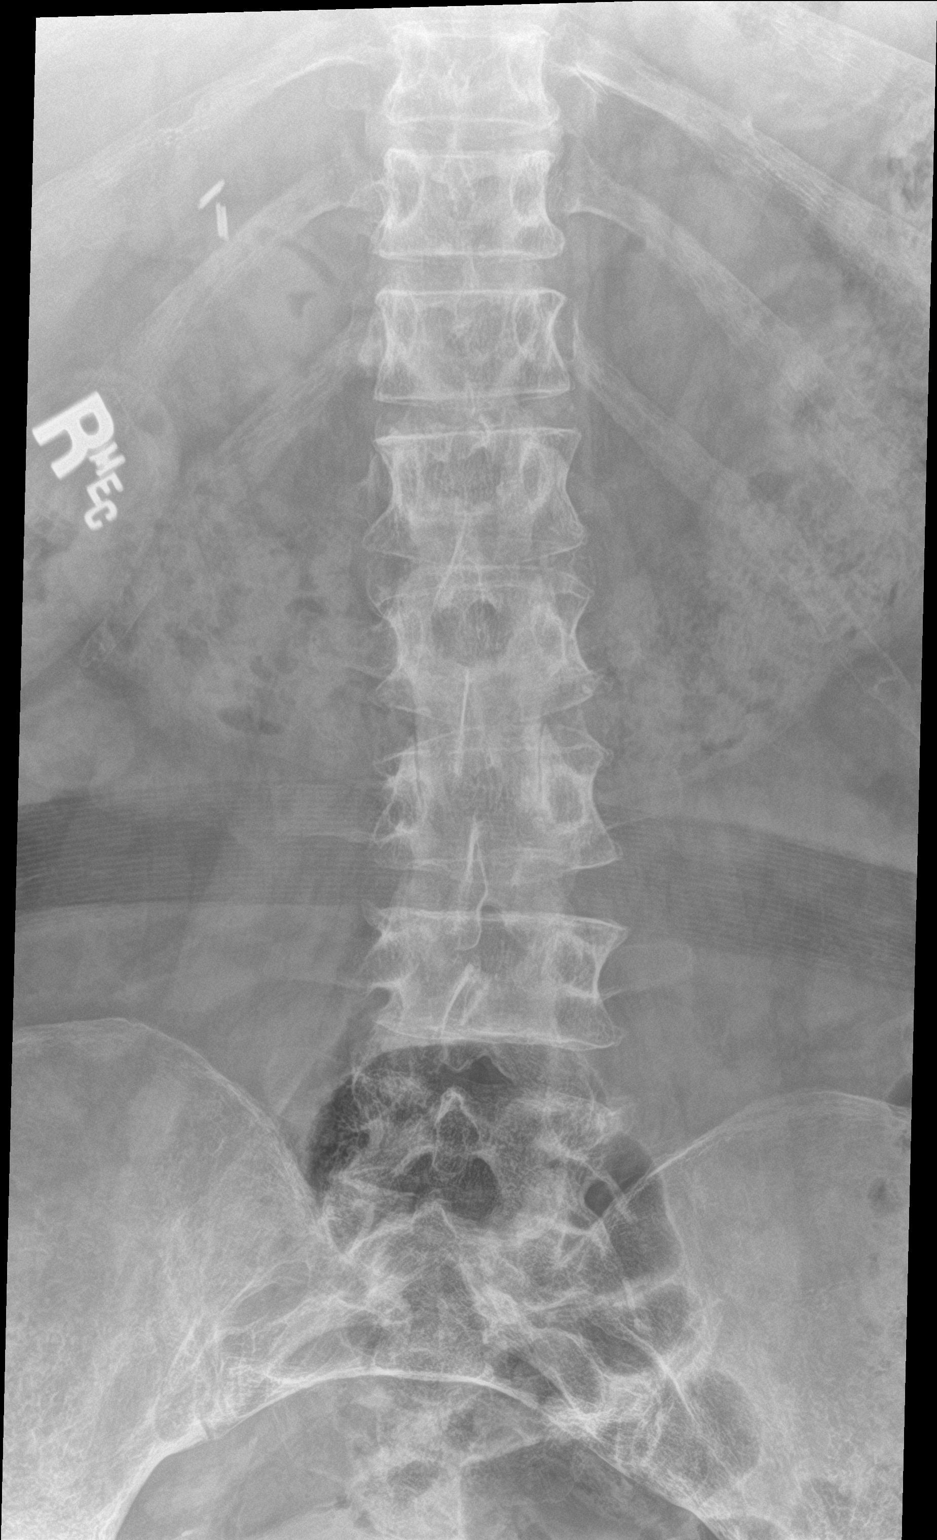

[l-spine lat]
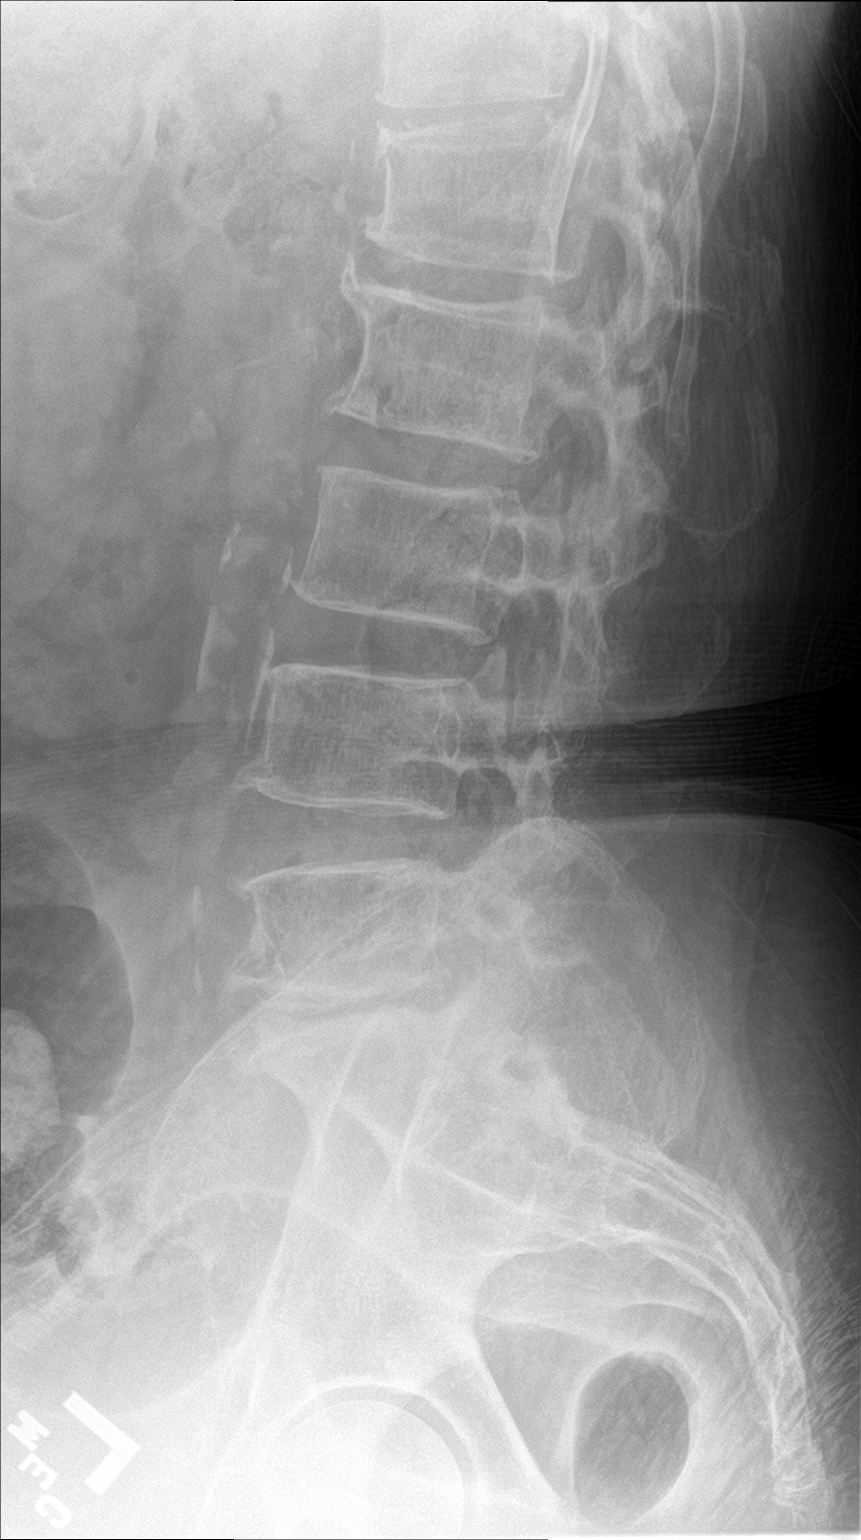

[l-spine spot]
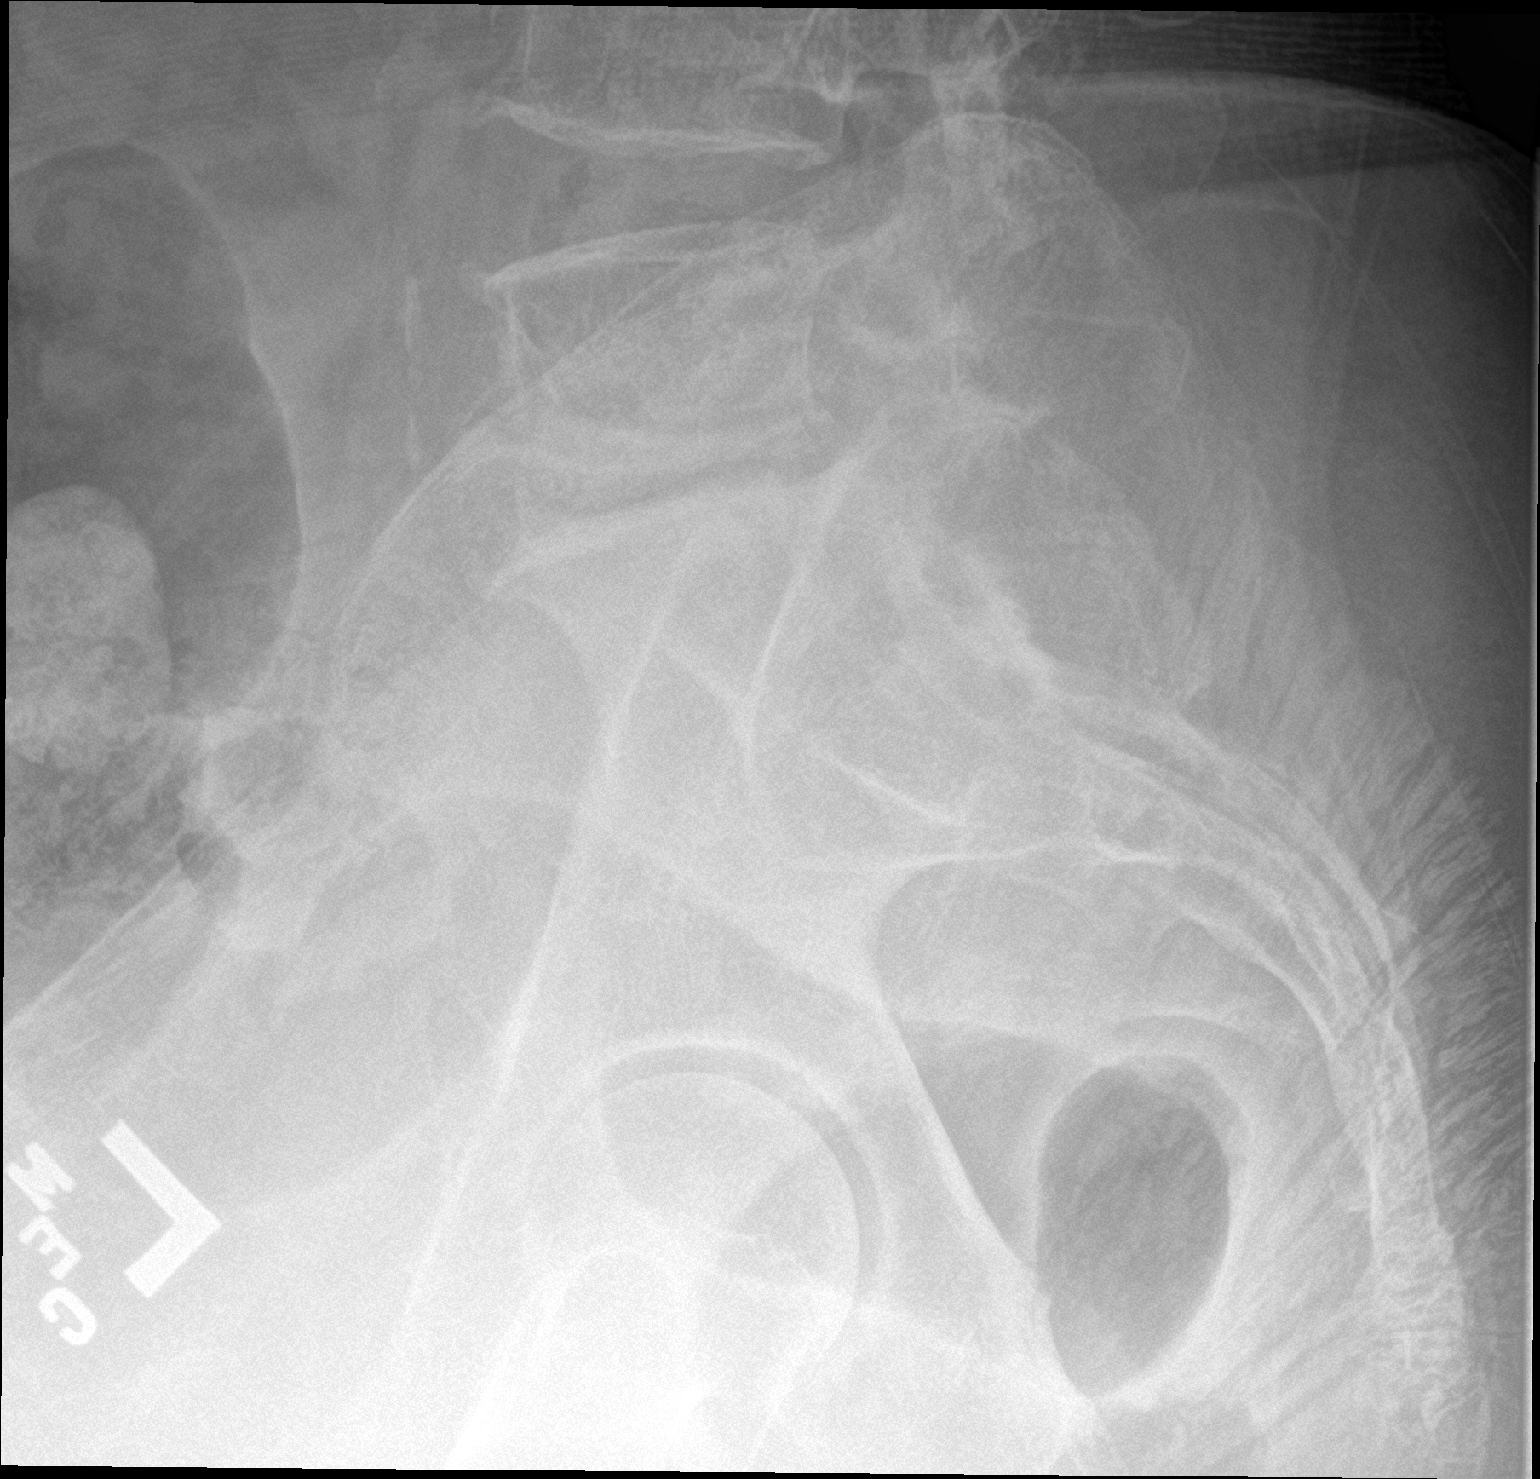

[3 of 3 positions shown; findings below may reference images not displayed]

FINDINGS: Normal alignment of the lumbar vertebral bodies. There are mild
stable compression deformities but no acute compression fracture.
The facets are normally aligned. Stable moderate degenerative disc
disease at L5-S1. The visualized bony pelvis is intact. The lower
left ribs are intact. Stable age advanced vascular calcifications.
IMPRESSION: 1. Stable mild compression deformities.
2. No acute bony findings.
3. Stable moderate degenerative disc disease at L5-S1.

## 2023-01-21 IMAGING — MR MR LUMBAR SPINE W/O CM
5 series · 31 of 48 positions shown · non-contrast
Comparison: Lumbar spine radiographs 05/27/2022 and 11/12/2021. MRI
of the lumbar spine 06/06/2015

CLINICAL DATA: Low back pain. Cauda equina syndrome suspected.

EXAM:
MRI LUMBAR SPINE WITHOUT CONTRAST
TECHNIQUE: Multiplanar, multisequence MR imaging of the lumbar spine was
performed. No intravenous contrast was administered.

[Series 1: T2 · sagittal · 4.0mm · 0.81mm/px · 6 of 17 slices shown (1 of 2)]
[im 1/17]
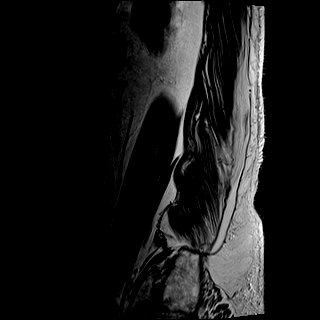
[im 4/17]
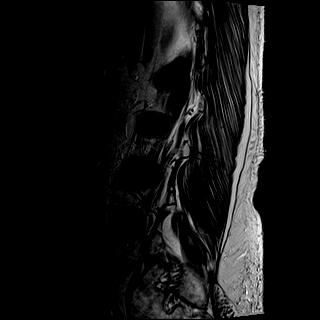
[im 7/17]
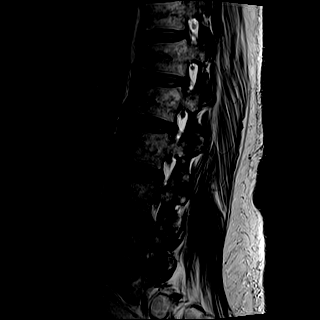
[im 10/17]
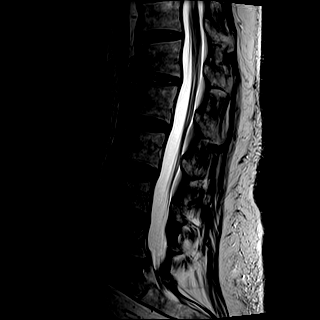
[im 13/17]
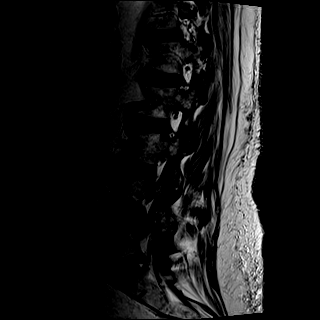
[im 17/17]
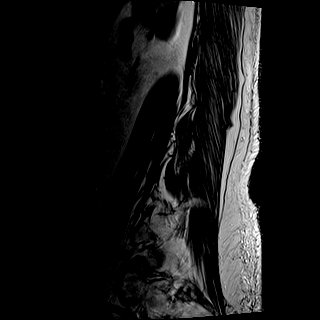

[Series 2: T1 · sagittal · 4.0mm · 0.81mm/px · 6 of 17 slices shown (1 of 2)]
[im 1/17]
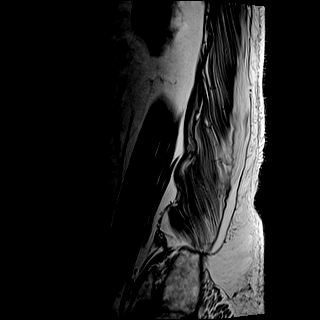
[im 4/17]
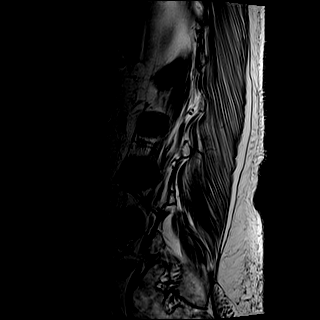
[im 7/17]
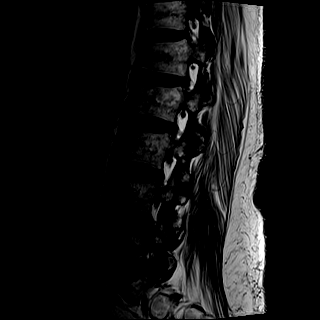
[im 10/17]
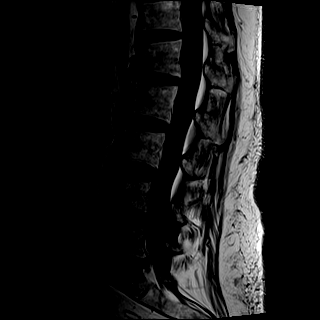
[im 13/17]
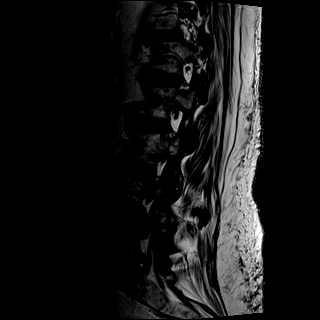
[im 17/17]
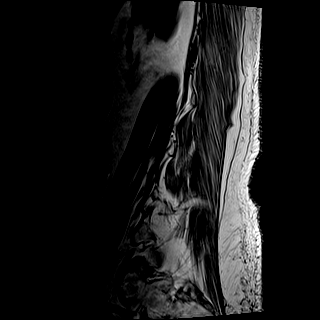

[Series 3: STIR · sagittal · 4.0mm · 0.41mm/px · 1 of 17 slices shown]
[im 1/17]
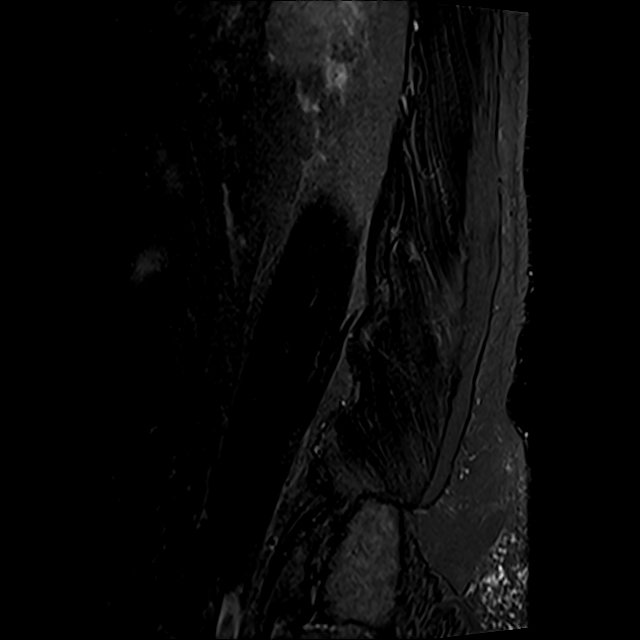

[Series 4: T2 · axial · 4.0mm · 0.78mm/px · z∈[-579,-355]mm · 9 of 40 slices shown (2 of 2)]
[im 1/40]
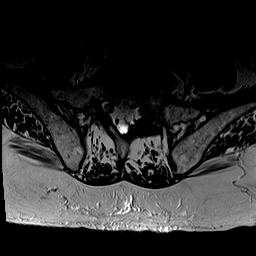
[im 6/40]
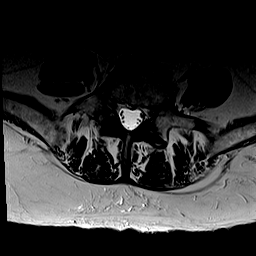
[im 12/40]
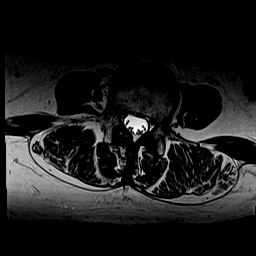
[im 17/40]
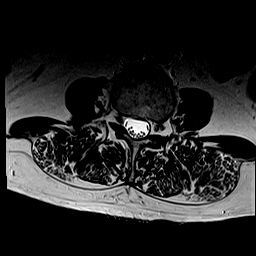
[im 20/40]
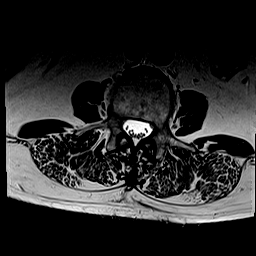
[im 23/40]
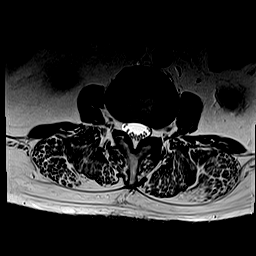
[im 28/40]
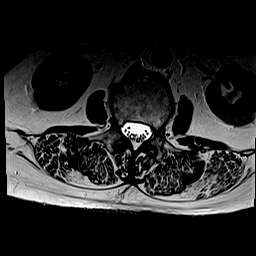
[im 34/40]
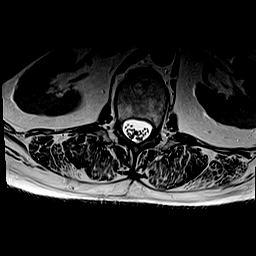
[im 40/40]
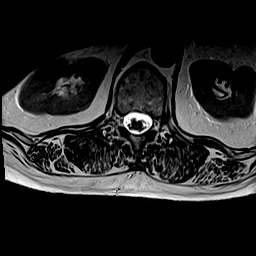

[Series 5: T1 · axial · 4.0mm · 0.39mm/px · z∈[-579,-355]mm · 9 of 40 slices shown (2 of 2)]
[im 1/40]
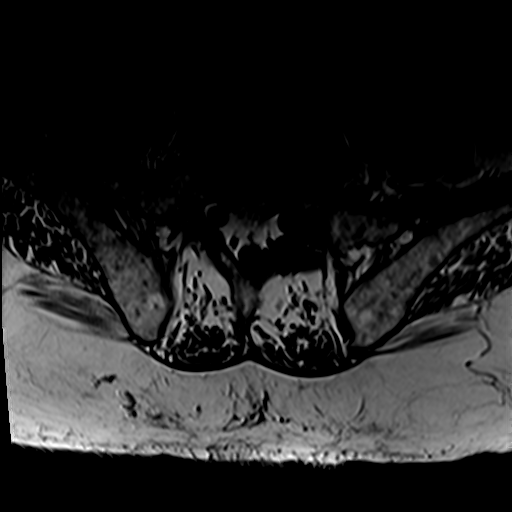
[im 6/40]
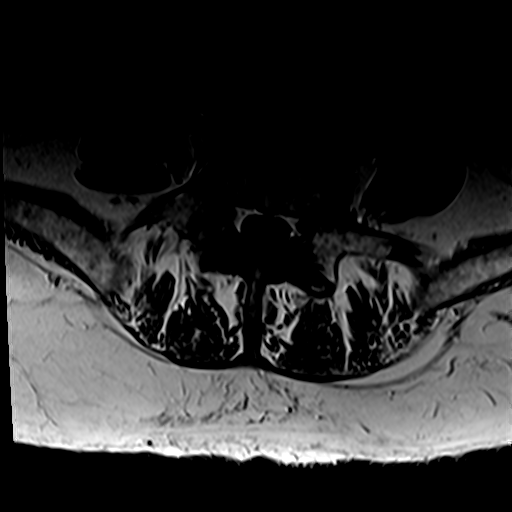
[im 12/40]
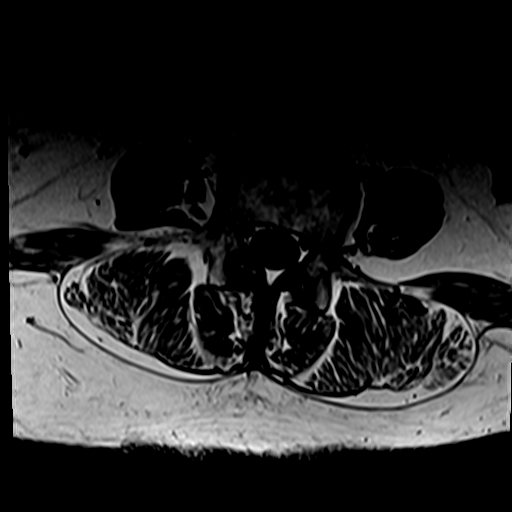
[im 17/40]
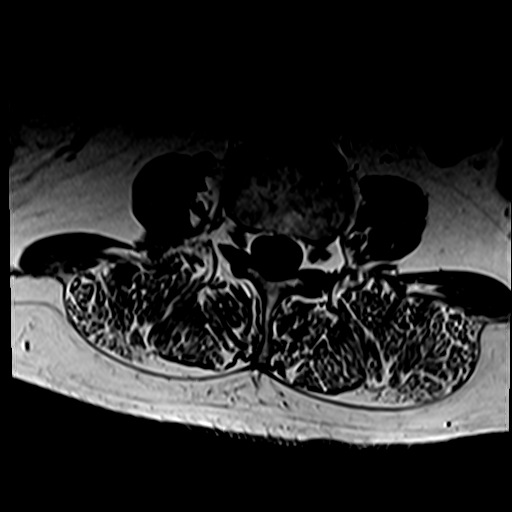
[im 20/40]
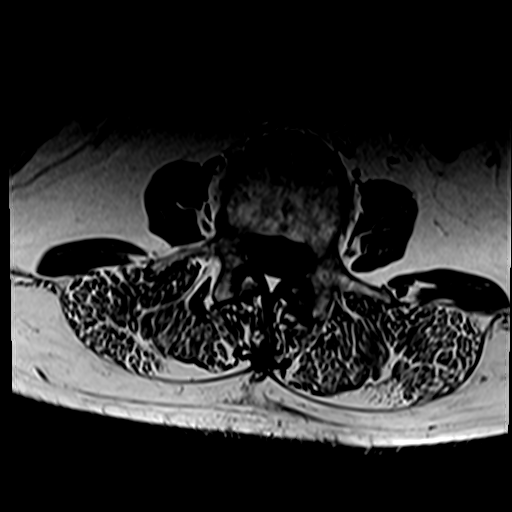
[im 23/40]
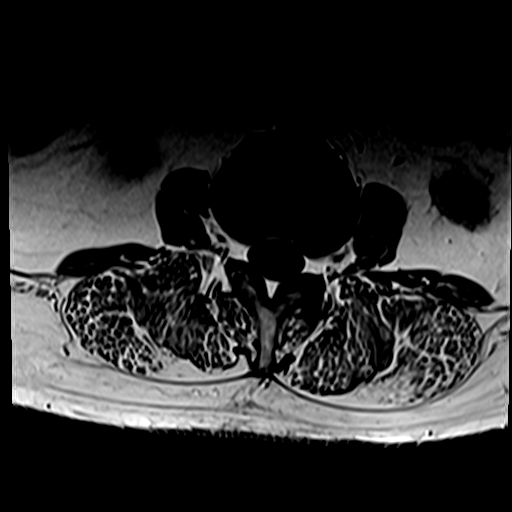
[im 28/40]
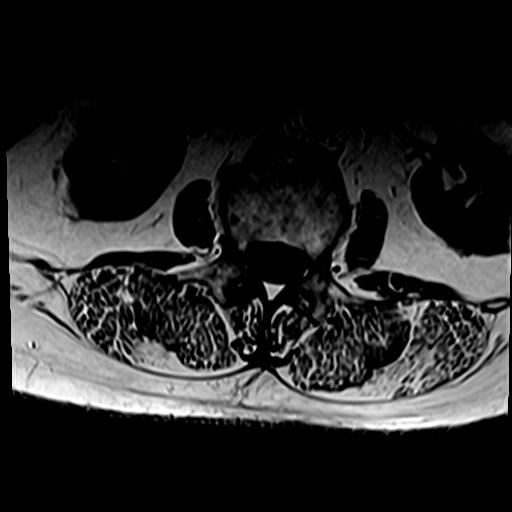
[im 34/40]
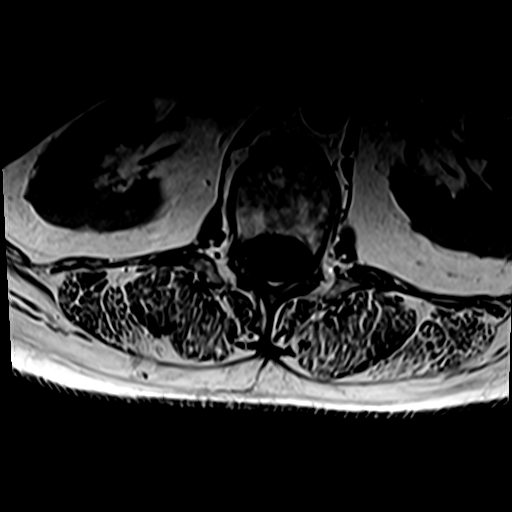
[im 40/40]
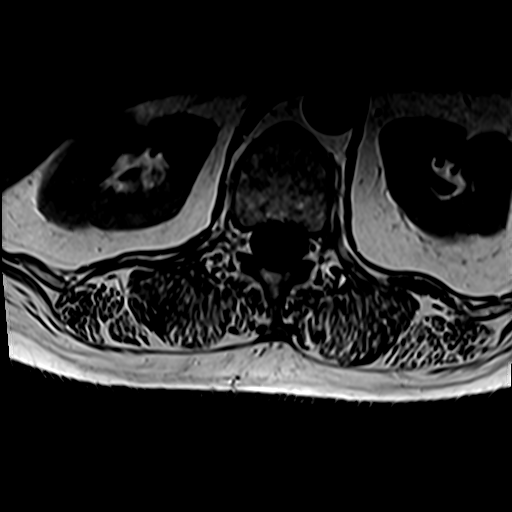

[31 of 48 positions shown; findings below may reference images not displayed]

FINDINGS: Segmentation: 5 non rib-bearing lumbar type vertebral bodies are
present. The lowest fully formed vertebral body is L5.

Alignment: Slight progression of retrolisthesis noted at L5-S1, now
measuring 8 mm. No other significant listhesis is present.
Straightening of the normal lumbar lordosis is present.

Vertebrae: Remote wedge deformity of L1 is stable. Chronic fatty
endplate marrow changes again noted at L5-S1. Marrow signal and
vertebral body heights are otherwise normal.

Conus medullaris and cauda equina: Conus extends to the T12-L1
level. Conus and cauda equina appear normal.

Paraspinal and other soft tissues: Limited imaging the abdomen is
unremarkable. There is no significant adenopathy. No solid organ
lesions are present.

Disc levels:

L1-2: Negative.

L2-3: Negative.

L3-4: Mild facet hypertrophy is present. No significant disc
protrusion or stenosis is present.

L4-5: A rightward disc protrusion is present. No significant focal
stenosis is present. Mild facet hypertrophy has progressed slightly.

L5-S1: Uncovering of a central disc protrusion demonstrates some
progression. Mild to moderate subarticular and foraminal stenosis
has progressed, left greater than right.
IMPRESSION: 1. Progressive mild to moderate subarticular and foraminal stenosis
at L5-S1, left greater than right.
2. Slight progression of retrolisthesis at L5-S1.
3. Rightward disc protrusion at L4-5 without significant stenosis.
4. Mild facet hypertrophy at L3-4 without significant disc
protrusion or stenosis.

## 2023-01-23 ENCOUNTER — Encounter: Payer: Self-pay | Admitting: Family Medicine

## 2023-01-25 ENCOUNTER — Other Ambulatory Visit: Payer: Self-pay

## 2023-01-25 ENCOUNTER — Encounter: Payer: BLUE CROSS/BLUE SHIELD | Admitting: Physical Therapy

## 2023-01-25 MED ORDER — METFORMIN HCL 500 MG PO TABS: 1000.0000 mg | ORAL_TABLET | Freq: Two times a day (BID) | ORAL | 3 refills | Status: DC

## 2023-01-26 ENCOUNTER — Ambulatory Visit: Payer: BC Managed Care – PPO | Admitting: Physical Therapy

## 2023-01-27 ENCOUNTER — Encounter: Payer: Self-pay | Admitting: Student in an Organized Health Care Education/Training Program

## 2023-01-27 ENCOUNTER — Ambulatory Visit
Payer: BC Managed Care – PPO | Attending: Student in an Organized Health Care Education/Training Program | Admitting: Student in an Organized Health Care Education/Training Program

## 2023-01-27 ENCOUNTER — Other Ambulatory Visit: Payer: Self-pay

## 2023-01-27 ENCOUNTER — Telehealth: Payer: Self-pay

## 2023-01-27 DIAGNOSIS — W19XXXD Unspecified fall, subsequent encounter: Secondary | ICD-10-CM

## 2023-01-27 DIAGNOSIS — E1142 Type 2 diabetes mellitus with diabetic polyneuropathy: Secondary | ICD-10-CM | POA: Diagnosis not present

## 2023-01-27 DIAGNOSIS — G8929 Other chronic pain: Secondary | ICD-10-CM | POA: Diagnosis not present

## 2023-01-27 DIAGNOSIS — M792 Neuralgia and neuritis, unspecified: Secondary | ICD-10-CM | POA: Insufficient documentation

## 2023-01-27 MED ORDER — CAPSAICIN-CLEANSING GEL 8 % EX KIT
4.0000 | PACK | Freq: Once | CUTANEOUS | Status: AC
  Administered 2023-01-27: 4 via TOPICAL
  Filled 2023-01-27: qty 4

## 2023-01-27 NOTE — Progress Notes (Signed)
Safety precautions to be maintained throughout the outpatient stay will include: orient to surroundings, keep bed in low position, maintain call bell within reach at all times, provide assistance with transfer out of bed and ambulation.  

## 2023-01-27 NOTE — Progress Notes (Signed)
PROVIDER NOTE: Interpretation of information contained herein should be left to medically-trained personnel. Specific patient instructions are provided elsewhere under "Patient Instructions" section of medical record. This document was created in part using STT-dictation technology, any transcriptional errors that may result from this process are unintentional.  Patient: Gabriella Marsh Type: Established DOB: 16-Jul-1966 MRN: [id number] PCP: [name], FNP  Service: Procedure DOS: 01/27/2023 Setting: Ambulatory Location: Ambulatory outpatient facility Delivery: Face-to-face Provider: Gillis Santa, MD Specialty: Interventional Pain Management Specialty designation: 09 Location: Outpatient facility Ref. Prov.: Gillis Santa, MD    Primary Reason for Visit: Interventional Pain Management Treatment. CC: foot pain    Procedure #1:   Qutenza Neurolysis for PDN     1. Diabetic peripheral neuropathy ([place])   2. Chronic peripheral neuropathic pain    NAS-11 Pain score:   Pre-procedure: 6 /10   Post-procedure: 6 /10     Pre-op H&P Assessment:  Gabriella Marsh is a 57 y.o. (year old), female patient, seen today for interventional treatment. She  has a past surgical history that includes Cesarean section; Shoulder surgery (Left, 12/26/014); Abdominal hysterectomy; Tonsillectomy and adenoidectomy; Uterine fibroid surgery; Ovarian cyst surgery; Cholecystectomy; left arm frature (10/12/2020); Thyroidectomy (N/A, 03/28/2021); Parathyroidectomy (03/28/2021); Amputation toe (Right, 07/24/2021); Colonoscopy with propofol (N/A, 02/20/2022); and right femur surgery (Right). Gabriella Marsh has a current medication list which includes the following prescription(s): atorvastatin, clopidogrel, cyclobenzaprine, trulicity, fluticasone, gabapentin, gabapentin, hydrocortisone, hydroxyzine, lantus solostar, levothyroxine, levothyroxine, melatonin, metformin, nortriptyline, omeprazole, oxycodone-acetaminophen, [START  ON 02/08/2023] oxycodone-acetaminophen, [START ON 03/10/2023] oxycodone-acetaminophen, and trazodone. Her primarily concern today is the Foot Pain (bilateral)  Initial Vital Signs:  Pulse/HCG Rate: 87  Temp: (!) 97 F (36.1 C) Resp: 16 BP: 136/86 SpO2: 96 %  BMI: Estimated body mass index is 39.32 kg/m as calculated from the following:   Height as of this encounter: '5\' 2"'$  (1.575 m).   Weight as of this encounter: 215 lb (97.5 kg).  Risk Assessment: Allergies: Reviewed. She is allergic to celecoxib, pregabalin, buspar [buspirone], citalopram, and other.  Allergy Precautions: None required Coagulopathies: Reviewed. None identified.  Blood-thinner therapy: None at this time Active Infection(s): Reviewed. None identified. Gabriella Marsh is afebrile  Site Confirmation: Gabriella Marsh was asked to confirm the procedure and laterality before marking the site Procedure checklist: Completed Consent: Before the procedure and under the influence of no sedative(s), amnesic(s), or anxiolytics, the patient was informed of the treatment options, risks and possible complications. To fulfill our ethical and legal obligations, as recommended by the American Medical Association's Code of Ethics, I have informed the patient of my clinical impression; the nature and purpose of the treatment or procedure; the risks, benefits, and possible complications of the intervention; the alternatives, including doing nothing; the risk(s) and benefit(s) of the alternative treatment(s) or procedure(s); and the risk(s) and benefit(s) of doing nothing. The patient was provided information about the general risks and possible complications associated with the procedure. These may include, but are not limited to: failure to achieve desired goals, infection, bleeding, organ or nerve damage, allergic reactions, paralysis, and death. In addition, the patient was informed of those risks and complications associated to the procedure, such as  failure to decrease pain; infection; bleeding; organ or nerve damage with subsequent damage to sensory, motor, and/or autonomic systems, resulting in permanent pain, numbness, and/or weakness of one or several areas of the body; allergic reactions; (i.e.: anaphylactic reaction); and/or death. Furthermore, the patient was informed of those risks and complications associated with the medications. These  include, but are not limited to: allergic reactions (i.e.: anaphylactic or anaphylactoid reaction(s)); adrenal axis suppression; blood sugar elevation that in diabetics may result in ketoacidosis or comma; water retention that in patients with history of congestive heart failure may result in shortness of breath, pulmonary edema, and decompensation with resultant heart failure; weight gain; swelling or edema; medication-induced neural toxicity; particulate matter embolism and blood vessel occlusion with resultant organ, and/or nervous system infarction; and/or aseptic necrosis of one or more joints. Finally, the patient was informed that Medicine is not an exact science; therefore, there is also the possibility of unforeseen or unpredictable risks and/or possible complications that may result in a catastrophic outcome. The patient indicated having understood very clearly. We have given the patient no guarantees and we have made no promises. Enough time was given to the patient to ask questions, all of which were answered to the patient's satisfaction. Gabriella Marsh has indicated that she wanted to continue with the procedure. Attestation: I, the ordering provider, attest that I have discussed with the patient the benefits, risks, side-effects, alternatives, likelihood of achieving goals, and potential problems during recovery for the procedure that I have provided informed consent. Date  Time: 01/27/2023 10:22 AM  Pre-Procedure Preparation:  Monitoring: As per clinic protocol. Respiration, ETCO2, SpO2, BP, heart  rate and rhythm monitor placed and checked for adequate function Safety Precautions: Patient was assessed for positional comfort and pressure points before starting the procedure. Time-out: I initiated and conducted the "Time-out" before starting the procedure, as per protocol. The patient was asked to participate by confirming the accuracy of the "Time Out" information. Verification of the correct person, site, and procedure were performed and confirmed by me, the nursing staff, and the patient. "Time-out" conducted as per Joint Commission's Universal Protocol (UP.01.01.01). Time: 1036  Description of Procedure:           Area Prepped: Entire foot Region DuraPrep (Iodine Povacrylex [0.7% available iodine] and Isopropyl Alcohol, 74% w/w)                   2,  8% Qutenza patches were applied to each foot, on the plantar and dorsal aspect covering areas that were painful. 4 total patches with used, 2 for each foot Patient tolerated the procedure well without any issues.    Vitals:   01/27/23 1033  BP: 136/86  Pulse: 87  Resp: 16  Temp: (!) 97 F (36.1 C)  SpO2: 96%  Weight: 215 lb (97.5 kg)  Height: '5\' 2"'$  (1.575 m)    Start Time: 1040 hrs. End Time:   hrs.     Post-operative Assessment:  Post-procedure Vital Signs:  Pulse/HCG Rate: 87  Temp:  (!) 97 F (36.1 C) Resp: 16 BP:  136/86 SpO2: 96 %  EBL: None  Complications: No immediate post-treatment complications observed by team, or reported by patient.  Note: The patient tolerated the entire procedure well. A repeat set of vitals were taken after the procedure and the patient was kept under observation following institutional policy, for this type of procedure. Post-procedural neurological assessment was performed, showing return to baseline, prior to discharge. The patient was provided with post-procedure discharge instructions, including a section on how to identify potential problems. Should any problems arise  concerning this procedure, the patient was given instructions to immediately contact us, at any time, without hesitation. In any case, we plan to contact the patient by telephone for a follow-up status report regarding this interventional procedure.  Comments:  No additional relevant information.  Plan of Care    Chronic Opioid Analgesic:  Percocet 5 mg TID as needed, quantity 75/month   Medications ordered for procedure: Meds ordered this encounter  Medications   capsaicin topical system 8 % patch 4 patch   Medications administered: We administered capsaicin topical system.  See the medical record for exact dosing, route, and time of administration.  Follow-up plan:   Return for Keep sch. appt.      Recent Visits Date Type Provider Dept  12/31/22 Office Visit Gillis Santa, MD [facility]  Showing recent visits within past 90 days and meeting all other requirements Today's Visits Date Type Provider Dept  01/27/23 Procedure visit Gillis Santa, MD [facility]  Showing today's visits and meeting all other requirements Future Appointments Date Type Provider Dept  03/23/23 Appointment Gillis Santa, MD [facility]  Showing future appointments within next 90 days and meeting all other requirements  Disposition: Discharge home  Discharge (Date  Time): 01/27/2023;   hrs.   Primary Care Physician: [name], FNP Location: [facility] Note by: Gillis Santa, MD Date: 01/27/2023; Time: 11:13 AM  Disclaimer:  Medicine is not an exact science. The only guarantee in medicine is that nothing is guaranteed. It is important to note that the decision to proceed with this intervention was based on the information collected from the patient. The Data and conclusions were drawn from the patient's questionnaire, the interview, and the physical examination. Because the information was provided in large part by the patient, it cannot  be guaranteed that it has not been purposely or unconsciously manipulated. Every effort has been made to obtain as much relevant data as possible for this evaluation. It is important to note that the conclusions that lead to this procedure are derived in large part from the available data. Always take into account that the treatment will also be dependent on availability of resources and existing treatment guidelines, considered by other Pain Management Practitioners as being common knowledge and practice, at the time of the intervention. For Medico-Legal purposes, it is also important to point out that variation in procedural techniques and pharmacological choices are the acceptable norm. The indications, contraindications, technique, and results of the above procedure should only be interpreted and judged by a Board-Certified Interventional Pain Specialist with extensive familiarity and expertise in the same exact procedure and technique.

## 2023-01-27 NOTE — Patient Instructions (Signed)

## 2023-01-27 NOTE — Telephone Encounter (Signed)
-----   Message from [name], FNP sent at 01/25/2023  4:55 PM EST ----- Regarding: FW: new order to return to PT upon hospital dc OK for orders to resume care ----- Message ----- From: Tera Partridge Sent: 01/25/2023   2:16 PM EST To: [name], FNP Subject: new order to return to PT upon hospital dc     Good afternoon.  This is Pharmacist, hospital with outpatient rehab at [facility]. Since, Gabriella Marsh was discharged from the hospital, we will need a new order to resume therapy.  If you concur, can you please fax or send internal.  Thank you so much!

## 2023-01-27 NOTE — Telephone Encounter (Signed)
Orders placed.

## 2023-01-28 ENCOUNTER — Telehealth: Payer: Self-pay

## 2023-01-28 NOTE — Telephone Encounter (Signed)
Post procedure follow up.  Patient states she is doing well.   ?

## 2023-02-01 ENCOUNTER — Encounter: Payer: BLUE CROSS/BLUE SHIELD | Admitting: Physical Therapy

## 2023-02-01 NOTE — Progress Notes (Deleted)
02/02/2023 8:51 AM   Gabriella Marsh Sep 29, 1966 [id number]  Referring provider: [name], [address] [place],  [place] [address]  Urological history: 1.     No chief complaint on file.   HPI: Gabriella Marsh is a 57 y.o. *** who presents today for ****   PMH: Past Medical History:  Diagnosis Date   Arthritis    knees   Blood clotting disorder ([place])    on toe    Degenerative disc disease, lumbar    Diabetes mellitus without complication (HCC)    GERD (gastroesophageal reflux disease)    Headache    daily - AM (has not been able to have SPG blocks lately)   Hyperlipidemia    Hypertension    Neuropathy    feet   Vertigo    3-4x/yr    Surgical History: Past Surgical History:  Procedure Laterality Date   ABDOMINAL HYSTERECTOMY     But still has cervix   AMPUTATION TOE Right 07/24/2021   Procedure: AMPUTATION TOE;  Surgeon: Sharlotte Alamo, DPM;  Location: [facility] ORS;  Service: Podiatry;  Laterality: Right;   CESAREAN SECTION     CHOLECYSTECTOMY     COLONOSCOPY WITH PROPOFOL N/A 02/20/2022   Procedure: COLONOSCOPY WITH PROPOFOL;  Surgeon: [name], MD;  Location: [facility] ENDOSCOPY;  Service: Gastroenterology;  Laterality: N/A;   left arm frature  10/12/2020   OVARIAN CYST SURGERY     PARATHYROIDECTOMY  03/28/2021   Procedure: PARATHYROIDECTOMY AUTOTRANSPLANT;  Surgeon: Fredirick Maudlin, MD;  Location: [facility] ORS;  Service: General;;   right femur surgery Right    SHOULDER SURGERY Left 12/26/014   Dr. Little Ishikawa, [place] N/A 03/28/2021   Procedure: THYROIDECTOMY, total;  Surgeon: Fredirick Maudlin, MD;  Location: [facility] ORS;  Service: General;  Laterality: N/A;  Provider requesting 3 hours /180 minutes for procedure   TONSILLECTOMY AND ADENOIDECTOMY     UTERINE FIBROID SURGERY      Home Medications:  Allergies as of 02/02/2023       Reactions   Celecoxib Shortness Of Breath   Pregabalin Shortness Of Breath   Buspar  [buspirone] Other (See Comments)   Tachycardia   Citalopram Cough   Other Cough   [place] pear trees        Medication List        Accurate as of February 01, 2023  8:51 AM. If you have any questions, ask your nurse or doctor.          atorvastatin 20 MG tablet Commonly known as: LIPITOR TAKE 1 TABLET(20 MG) BY MOUTH DAILY   clopidogrel 75 MG tablet Commonly known as: PLAVIX TAKE 1 TABLET(75 MG) BY MOUTH DAILY   cyclobenzaprine 5 MG tablet Commonly known as: FLEXERIL Take 1 tablet (5 mg total) by mouth 3 (three) times daily as needed for muscle spasms.   fluticasone 50 MCG/ACT nasal spray Commonly known as: FLONASE Place 2 sprays into both nostrils daily.   gabapentin 100 MG capsule Commonly known as: NEURONTIN TAKE 2 CAPSULES THREE TIMES A DAY ALONG WITH 600 MG THREE TIMES A DAY FOR A TOTAL DAILY DOSE OF 2400 MG DAILY   gabapentin 600 MG tablet Commonly known as: NEURONTIN TAKE 1 TABLET BY MOUTH 3 TIMES  DAILY   hydrocortisone 1 % lotion Apply 1 Application topically 2 (two) times daily.   hydrOXYzine 50 MG capsule Commonly known as: VISTARIL Take 1 capsule (50 mg total) by mouth 3 (three) times daily  as needed for itching.   Lantus SoloStar 100 UNIT/ML Solostar Pen Generic drug: insulin glargine INJECT 20 UNITS UNDER THE SKIN AT BEDTIME, TITRATE TO FASTING SUGAR OF 130-180 AS DIRECTED What changed: See the new instructions.   levothyroxine 200 MCG tablet Commonly known as: SYNTHROID Take 200 mcg by mouth daily before breakfast.   levothyroxine 25 MCG tablet Commonly known as: SYNTHROID Take 1 tablet (25 mcg total) by mouth daily. Please take WITH your 200 mcg dose. Recommend labs in 6-8 weeks.   Melatonin 10 MG Tabs Take 10 mg by mouth at bedtime.   metFORMIN 500 MG tablet Commonly known as: GLUCOPHAGE Take 2 tablets (1,000 mg total) by mouth 2 (two) times daily with a meal.   nortriptyline 25 MG capsule Commonly known as: PAMELOR TAKE 2  CAPSULES BY MOUTH AT  BEDTIME   omeprazole 20 MG capsule Commonly known as: PRILOSEC TAKE 1 CAPSULE(20 MG) BY MOUTH DAILY   oxyCODONE-acetaminophen 5-325 MG tablet Commonly known as: Percocet Take 1 tablet by mouth every 8 (eight) hours. Must last 30 days.   oxyCODONE-acetaminophen 5-325 MG tablet Commonly known as: Percocet Take 1 tablet by mouth every 8 (eight) hours. Must last 30 days. Start taking on: February 08, 2023   oxyCODONE-acetaminophen 5-325 MG tablet Commonly known as: Percocet Take 1 tablet by mouth every 8 (eight) hours. Must last 30 days. Start taking on: March 10, 2023   traZODone 50 MG tablet Commonly known as: DESYREL Take 2 tablets (100 mg total) by mouth at bedtime.   Trulicity 3 0000000 Sopn Generic drug: Dulaglutide Inject 3 mg as directed once a week.        Allergies:  Allergies  Allergen Reactions   Celecoxib Shortness Of Breath   Pregabalin Shortness Of Breath   Buspar [Buspirone] Other (See Comments)    Tachycardia   Citalopram Cough   Other Cough    [place] pear trees    Family History: Family History  Problem Relation Age of Onset   Diabetes Father    Heart disease Father    Hypertension Father    Hyperlipidemia Father    Congestive Heart Failure Father    Healthy Brother    Osteoporosis Mother    Irritable bowel syndrome Mother    Osteoporosis Maternal Grandmother     Social History:  reports that she quit smoking about 11 years ago. Her smoking use included cigarettes. She has never used smokeless tobacco. She reports that she does not drink alcohol and does not use drugs.  ROS: Pertinent ROS in HPI  Physical Exam: There were no vitals taken for this visit.  Constitutional:  Well nourished. Alert and oriented, No acute distress. HEENT: [place] AT, moist mucus membranes.  Trachea midline, no masses. Cardiovascular: No clubbing, cyanosis, or edema. Respiratory: Normal respiratory effort, no increased work of  breathing. GI: Abdomen is soft, non tender, non distended, no abdominal masses. Liver and spleen not palpable.  No hernias appreciated.  Stool sample for occult testing is not indicated.   GU: No CVA tenderness.  No bladder fullness or masses.  Patient with circumcised/uncircumcised phallus. ***Foreskin easily retracted***  Urethral meatus is patent.  No penile discharge. No penile lesions or rashes. Scrotum without lesions, cysts, rashes and/or edema.  Testicles are located scrotally bilaterally. No masses are appreciated in the testicles. Left and right epididymis are normal. Rectal: Patient with  normal sphincter tone. Anus and perineum without scarring or rashes. No rectal masses are appreciated. Prostate is approximately *** grams, ***  nodules are appreciated. Seminal vesicles are normal. Skin: No rashes, bruises or suspicious lesions. Lymph: No cervical or inguinal adenopathy. Neurologic: Grossly intact, no focal deficits, moving all 4 extremities. Psychiatric: Normal mood and affect.  Laboratory Data: Lab Results  Component Value Date   WBC 7.3 01/09/2023   HGB 12.4 01/09/2023   HCT 36.7 01/09/2023   MCV 86.4 01/09/2023   PLT 368 01/09/2023    Lab Results  Component Value Date   CREATININE 0.61 01/09/2023    No results found for: "PSA"  No results found for: "TESTOSTERONE"  Lab Results  Component Value Date   HGBA1C 8.6 (A) 09/10/2022    Lab Results  Component Value Date   TSH 166.000 (H) 01/07/2023       Component Value Date/Time   CHOL 166 04/24/2022 1553   HDL 33 (L) 04/24/2022 1553   CHOLHDL 5.0 (H) 04/24/2022 1553   LDLCALC 66 04/24/2022 1553    Lab Results  Component Value Date   AST 57 (H) 01/07/2023   Lab Results  Component Value Date   ALT 76 (H) 01/07/2023   No components found for: "ALKALINEPHOPHATASE" No components found for: "BILIRUBINTOTAL"  No results found for: "ESTRADIOL"  Urinalysis    Component Value Date/Time   COLORURINE YELLOW  (A) 01/07/2023 1041   APPEARANCEUR HAZY (A) 01/07/2023 1041   LABSPEC 1.021 01/07/2023 1041   PHURINE 5.0 01/07/2023 1041   GLUCOSEU NEGATIVE 01/07/2023 1041   [place] 01/07/2023 [place] 01/07/2023 1041   BILIRUBINUR Negative 05/22/2020 0839   KETONESUR NEGATIVE 01/07/2023 1041   PROTEINUR 30 (A) 01/07/2023 1041   UROBILINOGEN 0.2 05/22/2020 0839   NITRITE NEGATIVE 01/07/2023 1041   LEUKOCYTESUR LARGE (A) 01/07/2023 1041    I have reviewed the labs.   Pertinent Imaging: @CT$ @ @ultrasound$ @ @KUB$ @ I have independently reviewed the films.    Assessment & Plan:  ***  There are no diagnoses linked to this encounter.  No follow-ups on file.  These notes generated with voice recognition software. I apologize for typographical errors.  [place], [place] [address]  [place], [place] [address] [phone]

## 2023-02-01 NOTE — Progress Notes (Deleted)
01/07/2023 7:41 PM   Gabriella Marsh March 19, 1966 [id number]  Referring provider: [name], [address] [place],  [place] [address]  Urological history: 1.  Urinary retention -Contributing factors of not ambulation, uncontrolled diabetes, lumbar DDD obesity  Chief Complaint  Patient presents with   Follow-up    HPI: Gabriella Marsh is a 57 y.o. female who presents today for TOV.  At her visit on January 07, 2023, her blood pressure to be 64/42.  She states that her low pressure was first discovered when she was at Dr. Precious Gilding office for pain management and it was 74/55 at that time.  She contacted her PCPs office and they discovered that she was on 3 medications for her blood pressure (furosemide, triamterene and lisinopril ).  She was told that she was only to be taking the lisinopril, but patient was taking the other medications because refills were given.  She had been feeling better after stopping the furosemide and the triamterene, but last night she felt that she did not have enough urine in her catheter bag so she took a furosemide.  This morning she states she is feeling "weird" and lightheaded.  She was admitted to the hospital for hypotension, UTI and AKI.  She presents today for trial of void.   PMH: Past Medical History:  Diagnosis Date   Arthritis    knees   Blood clotting disorder ([place])    on toe    Degenerative disc disease, lumbar    Diabetes mellitus without complication (HCC)    GERD (gastroesophageal reflux disease)    Headache    daily - AM (has not been able to have SPG blocks lately)   Hyperlipidemia    Hypertension    Neuropathy    feet   Vertigo    3-4x/yr    Surgical History: Past Surgical History:  Procedure Laterality Date   ABDOMINAL HYSTERECTOMY     But still has cervix   AMPUTATION TOE Right 07/24/2021   Procedure: AMPUTATION TOE;  Surgeon: Sharlotte Alamo, DPM;  Location: [facility] ORS;  Service: Podiatry;   Laterality: Right;   CESAREAN SECTION     CHOLECYSTECTOMY     COLONOSCOPY WITH PROPOFOL N/A 02/20/2022   Procedure: COLONOSCOPY WITH PROPOFOL;  Surgeon: [name], MD;  Location: [facility] ENDOSCOPY;  Service: Gastroenterology;  Laterality: N/A;   left arm frature  10/12/2020   OVARIAN CYST SURGERY     PARATHYROIDECTOMY  03/28/2021   Procedure: PARATHYROIDECTOMY AUTOTRANSPLANT;  Surgeon: Fredirick Maudlin, MD;  Location: [facility] ORS;  Service: General;;   right femur surgery Right    SHOULDER SURGERY Left 12/26/014   Dr. Little Ishikawa, [place] N/A 03/28/2021   Procedure: THYROIDECTOMY, total;  Surgeon: Fredirick Maudlin, MD;  Location: [facility] ORS;  Service: General;  Laterality: N/A;  Provider requesting 3 hours /180 minutes for procedure   TONSILLECTOMY AND ADENOIDECTOMY     UTERINE FIBROID SURGERY      Home Medications:  Allergies as of 01/07/2023       Reactions   Celecoxib Shortness Of Breath   Pregabalin Shortness Of Breath   Buspar [buspirone] Other (See Comments)   Tachycardia   Citalopram Cough   Other Cough   [place] pear trees        Medication List        Accurate as of January 07, 2023 10:27 AM. If you have any questions, ask your nurse or doctor.  atorvastatin 20 MG tablet Commonly known as: LIPITOR TAKE 1 TABLET(20 MG) BY MOUTH DAILY   clopidogrel 75 MG tablet Commonly known as: PLAVIX TAKE 1 TABLET(75 MG) BY MOUTH DAILY   cyclobenzaprine 5 MG tablet Commonly known as: FLEXERIL Take 1 tablet (5 mg total) by mouth 3 (three) times daily as needed for muscle spasms.   fluticasone 50 MCG/ACT nasal spray Commonly known as: FLONASE Place 2 sprays into both nostrils daily.   furosemide 20 MG tablet Commonly known as: LASIX TAKE 2 TABLETS(40 MG) BY MOUTH DAILY AS NEEDED FOR FLUID RETENTION OR SWELLING   gabapentin 100 MG capsule Commonly known as: NEURONTIN TAKE 2 CAPSULES THREE TIMES A DAY ALONG WITH 600 MG THREE TIMES A DAY FOR A TOTAL  DAILY DOSE OF 2400 MG DAILY   gabapentin 600 MG tablet Commonly known as: NEURONTIN TAKE 1 TABLET BY MOUTH 3 TIMES  DAILY   Insulin Pen Needle 31G X 5 MM Misc 1 each by Does not apply route daily.   Lantus SoloStar 100 UNIT/ML Solostar Pen Generic drug: insulin glargine INJECT 20 UNITS UNDER THE SKIN AT BEDTIME, TITRATE TO FASTING SUGAR OF 130-180 AS DIRECTED   levothyroxine 25 MCG tablet Commonly known as: SYNTHROID Take 1 tablet (25 mcg total) by mouth daily. Please take WITH your 200 mcg dose. Recommend labs in 6-8 weeks.   lisinopril 5 MG tablet Commonly known as: ZESTRIL TAKE 1 TABLET(5 MG) BY MOUTH DAILY   Melatonin 10 MG Tabs Take 10 mg by mouth at bedtime.   metFORMIN 500 MG tablet Commonly known as: GLUCOPHAGE Take 2 tablets (1,000 mg total) by mouth 2 (two) times daily with a meal.   nortriptyline 25 MG capsule Commonly known as: PAMELOR TAKE 2 CAPSULES BY MOUTH AT  BEDTIME   omeprazole 20 MG capsule Commonly known as: PRILOSEC TAKE 1 CAPSULE(20 MG) BY MOUTH DAILY   oxyCODONE-acetaminophen 5-325 MG tablet Commonly known as: Percocet Take 1 tablet by mouth every 8 (eight) hours. Must last 30 days.   oxyCODONE-acetaminophen 5-325 MG tablet Commonly known as: Percocet Take 1 tablet by mouth every 8 (eight) hours. Must last 30 days. Start taking on: February 08, 2023   oxyCODONE-acetaminophen 5-325 MG tablet Commonly known as: Percocet Take 1 tablet by mouth every 8 (eight) hours. Must last 30 days. Start taking on: March 10, 2023   traZODone 50 MG tablet Commonly known as: DESYREL Take 2 tablets (100 mg total) by mouth at bedtime.   Trulicity 3 0000000 Sopn Generic drug: Dulaglutide Inject 3 mg as directed once a week.        Allergies:  Allergies  Allergen Reactions   Celecoxib Shortness Of Breath   Pregabalin Shortness Of Breath   Buspar [Buspirone] Other (See Comments)    Tachycardia   Citalopram Cough   Other Cough    [place] pear  trees    Family History: Family History  Problem Relation Age of Onset   Diabetes Father    Heart disease Father    Hypertension Father    Hyperlipidemia Father    Congestive Heart Failure Father    Healthy Brother    Osteoporosis Mother    Irritable bowel syndrome Mother    Osteoporosis Maternal Grandmother     Social History:  reports that she quit smoking about 11 years ago. Her smoking use included cigarettes. She has never used smokeless tobacco. She reports that she does not drink alcohol and does not use drugs.  ROS: Pertinent ROS in HPI  Physical Exam: BP (!) 68/43 (BP Location: Left Arm, Patient Position: Sitting, Cuff Size: Normal)   Pulse 80   Constitutional:  Well nourished. Alert and oriented, No acute distress. HEENT: [place] AT, moist mucus membranes.  Trachea midline, no masses. Cardiovascular: No clubbing, cyanosis, or edema. Respiratory: Normal respiratory effort, no increased work of breathing. GU: No CVA tenderness.  No bladder fullness or masses. Vulvovaginal atrophy w/ pallor, loss of rugae, introital retraction, excoriations.  Vulvar thinning, fusion of labia, clitoral hood retraction, prominent urethral meatus.   *** external genitalia, *** pubic hair distribution, no lesions.  Normal urethral meatus, no lesions, no prolapse, no discharge.   No urethral masses, tenderness and/or tenderness. No bladder fullness, tenderness or masses. *** vagina mucosa, *** estrogen effect, no discharge, no lesions, *** pelvic support, *** cystocele and *** rectocele noted.  No cervical motion tenderness.  Uterus is freely mobile and non-fixed.  No adnexal/parametria masses or tenderness noted.  Anus and perineum are without rashes or lesions.   ***  Neurologic: Grossly intact, no focal deficits, moving all 4 extremities. Psychiatric: Normal mood and affect.    Laboratory Data:    Latest Ref Rng & Units 01/09/2023    4:45 AM 01/08/2023    7:20 AM 01/07/2023   10:41 AM  BMP   Glucose 70 - 99 mg/dL 146  140  169   BUN 6 - 20 mg/dL 7  8  18   $ Creatinine 0.44 - 1.00 mg/dL 0.61  0.70  1.06   Sodium 135 - 145 mmol/L 136  134  135   Potassium 3.5 - 5.1 mmol/L 3.5  3.8  3.4   Chloride 98 - 111 mmol/L 99  99  97   CO2 22 - 32 mmol/L 27  27  24   $ Calcium 8.9 - 10.3 mg/dL 8.3  7.8  8.2   I have reviewed the labs.    Pertinent Imaging: ***  Assessment & Plan:    1. Urinary retention -Foley catheter was not removed today and we will need to reschedule a voiding trial  Return for transferred to the ED .  These notes generated with voice recognition software. I apologize for typographical errors.  [place], [place] [address]  [place], [place] [address] [phone]

## 2023-02-02 ENCOUNTER — Ambulatory Visit: Payer: BC Managed Care – PPO | Admitting: Urology

## 2023-02-02 ENCOUNTER — Encounter: Payer: BC Managed Care – PPO | Admitting: Urology

## 2023-02-02 DIAGNOSIS — R339 Retention of urine, unspecified: Secondary | ICD-10-CM

## 2023-02-03 DIAGNOSIS — B351 Tinea unguium: Secondary | ICD-10-CM | POA: Diagnosis not present

## 2023-02-03 DIAGNOSIS — M21371 Foot drop, right foot: Secondary | ICD-10-CM | POA: Diagnosis not present

## 2023-02-03 DIAGNOSIS — L97509 Non-pressure chronic ulcer of other part of unspecified foot with unspecified severity: Secondary | ICD-10-CM | POA: Diagnosis not present

## 2023-02-03 DIAGNOSIS — E11621 Type 2 diabetes mellitus with foot ulcer: Secondary | ICD-10-CM | POA: Diagnosis not present

## 2023-02-04 ENCOUNTER — Ambulatory Visit: Payer: BC Managed Care – PPO | Attending: Orthopedic Surgery | Admitting: Physical Therapy

## 2023-02-04 DIAGNOSIS — R2689 Other abnormalities of gait and mobility: Secondary | ICD-10-CM

## 2023-02-04 DIAGNOSIS — R262 Difficulty in walking, not elsewhere classified: Secondary | ICD-10-CM

## 2023-02-04 DIAGNOSIS — M6281 Muscle weakness (generalized): Secondary | ICD-10-CM

## 2023-02-04 NOTE — Therapy (Signed)
OUTPATIENT PHYSICAL THERAPY LOWER EXTREMITY TREATMENT    Patient Name: Gabriella Marsh MRN: [id number] DOB:1966-07-07, 57 y.o., female Today's Date: 02/04/2023   PT End of Session - 02/04/23 1549     Visit Number 23    Number of Visits 42    Date for PT Re-Evaluation 03/16/23    Authorization Type BCBS Other    Progress Note Due on Visit 72    PT Start Time 1601    PT Stop Time 1641    PT Time Calculation (min) 40 min    Equipment Utilized During Treatment Gait belt    Activity Tolerance Patient tolerated treatment well;No increased pain    Behavior During Therapy WFL for tasks assessed/performed             Past Medical History:  Diagnosis Date   Arthritis    knees   Blood clotting disorder ([place])    on toe    Degenerative disc disease, lumbar    Diabetes mellitus without complication (HCC)    GERD (gastroesophageal reflux disease)    Headache    daily - AM (has not been able to have SPG blocks lately)   Hyperlipidemia    Hypertension    Neuropathy    feet   Vertigo    3-4x/yr   Past Surgical History:  Procedure Laterality Date   ABDOMINAL HYSTERECTOMY     But still has cervix   AMPUTATION TOE Right 07/24/2021   Procedure: AMPUTATION TOE;  Surgeon: Sharlotte Alamo, DPM;  Location: [facility] ORS;  Service: Podiatry;  Laterality: Right;   CESAREAN SECTION     CHOLECYSTECTOMY     COLONOSCOPY WITH PROPOFOL N/A 02/20/2022   Procedure: COLONOSCOPY WITH PROPOFOL;  Surgeon: [name], MD;  Location: [facility];  Service: Gastroenterology;  Laterality: N/A;   left arm frature  10/12/2020   OVARIAN CYST SURGERY     PARATHYROIDECTOMY  03/28/2021   Procedure: PARATHYROIDECTOMY AUTOTRANSPLANT;  Surgeon: Fredirick Maudlin, MD;  Location: [facility] ORS;  Service: General;;   right femur surgery Right    SHOULDER SURGERY Left 12/26/014   Dr. Little Ishikawa, Learned N/A 03/28/2021   Procedure: THYROIDECTOMY, total;  Surgeon: Fredirick Maudlin, MD;  Location: [facility]  ORS;  Service: General;  Laterality: N/A;  Provider requesting 3 hours /180 minutes for procedure   TONSILLECTOMY AND ADENOIDECTOMY     UTERINE FIBROID SURGERY     Patient Active Problem List   Diagnosis Date Noted   Hypotension 01/08/2023   Sepsis secondary to UTI ([place]) 01/07/2023   AKI (acute kidney injury) ([place]) 01/07/2023   Hypokalemia 01/07/2023   Neurogenic bladder 01/07/2023   FTT (failure to thrive) in adult 12/09/2022   Asthenia 12/09/2022   Need for influenza vaccination 09/10/2022   Primary insomnia 09/10/2022   Impaired functional mobility, balance, gait, and endurance 06/09/2022   Type 2 diabetes mellitus with diabetic neuropathy, without long-term current use of insulin ([place]) 04/24/2022   Hypothyroidism 04/24/2022   Hyperlipidemia associated with type 2 diabetes mellitus ([place]) 04/24/2022   Fall 04/24/2022   Dizziness 04/24/2022   Iron deficiency anemia 04/24/2022   Colon cancer screening 01/26/2022   Encounter for screening mammogram for malignant neoplasm of breast 01/26/2022   Chronic sensorimotor polyneuropathy with axonal and demyelinating features 01/12/2022   Osteoarthritis of knees (Bilateral) 01/12/2022   Tricompartment osteoarthritis of knees (Bilateral) 01/12/2022    Grade 1 Retrolisthesis (35m) of L5 over S1 01/12/2022   History of proximal humerus fracture (Left) 01/12/2022  Vitamin D deficiency 01/12/2022   Elevated hemoglobin A1c 01/12/2022   Hypochloremia 01/12/2022   Hyperglycemia 01/12/2022   Chronic peripheral neuropathic pain 01/12/2022   Type 2 diabetes mellitus with peripheral neuropathy ([place]) 01/12/2022   Glands swollen 01/09/2022   Acute cough 01/09/2022   Acute non-recurrent pansinusitis 01/09/2022   Rhinorrhea 01/09/2022   Left acute otitis media 01/09/2022   Pharmacologic therapy 11/12/2021   Disorder of skeletal system 11/12/2021   Problems influencing health status 11/12/2021   Chronic hand pain (1ry area of Pain) (Bilateral) (L>R)  11/12/2021   Chronic feet pain (3ry area of Pain) (Bilateral) (L>R) 11/12/2021   Chronic shoulder pain (4th area of Pain) (Left) 11/12/2021   Chronic knee pain (5th area of Pain) (Bilateral) (L>R) 11/12/2021   Lumbar facet syndrome (Bilateral) 11/12/2021   Muscle spasm 10/22/2021   Type 2 diabetes mellitus with microalbuminuria, without long-term current use of insulin ([place]) 10/22/2021   Diabetic foot infection ([place]) 07/22/2021   Depression with anxiety 07/22/2021   Sepsis ([place]) 07/22/2021   COVID-19 virus infection 07/22/2021   Hypocalcemia 07/17/2021   Chronic pain syndrome 07/17/2021   S/P total thyroidectomy 03/28/2021   Thyroid nodule    Schamberg's purpura 10/09/2019   Leg swelling 10/09/2019   Benign positional vertigo 02/03/2016   Common migraine with intractable migraine 06/18/2015   Anxiety 05/04/2015   Adaptation reaction 05/03/2015   DDD (degenerative disc disease), lumbosacral 05/03/2015   Hypercholesteremia 05/03/2015   Insomnia due to medical condition 05/03/2015   Adiposity 05/03/2015   Disorder of peripheral nervous system 05/03/2015   Snores 05/03/2015   Diabetic peripheral neuropathy ([place]) 03/06/2015   Essential hypertension 03/06/2015   Elevated liver function tests 03/06/2015   Obesity 03/06/2015   GERD (gastroesophageal reflux disease) 03/06/2015   Multinodular goiter 03/06/2015   RLS (restless legs syndrome) 03/06/2015   Chronic low back pain (2ry area of Pain) (Bilateral) (L>R) w/o sciatica 03/06/2015   Leg cramps 03/06/2015    PCP: [name], FNP  REFERRING PROVIDER: [name], FNP  REFERRING DIAG: [phone] (ICD-10-CM) - Fall, subsequent encounter  THERAPY DIAG:  Difficulty in walking, not elsewhere classified  Other abnormalities of gait and mobility  Muscle weakness (generalized)  Rationale for Evaluation and Treatment Rehabilitation  ONSET DATE: 06/18/22  SUBJECTIVE:   SUBJECTIVE STATEMENT: Patient reports she is happy to  be back at therapy following several weeks of missed appointment secondary to illness, hospital stay, difficulties obtaining order for return to therapy.  Patient reports she feels her legs are getting weaker she has not been able to stand.  She did not attempt standing in the home but was not comfortable completing this with her son and her husband only.  In addition patient reports her husband injured his arm is on difficulties with helping her with transfers now.  PERTINENT HISTORY: Pt reports her fall initially occurred in the evening when her husband was not home. Pt got up and had her Rolator and she walked toward her hallway. When she turned around to turn the light on she went completely down on her R leg and broke her femur distally and twisted her knee. When EMT arrived she had to straighten her LE to immobilize it. Pt went to [facility] hospital for the trauma.   Upon arrival she was evaluated and had to get MRI and imaging. Progressed to 06/19/22. Pt had surgery that night. Pt had to have plate and screws in her knee and rod in her femur. Pt was in  hospital for 11 days then progressed to [facility] for 30 days. [facility].   Upon coming home with [facility] pt came home with catheter. Pt came home and was relieved. Pt husband helps with everything doing "everything" for her except she helps with cooking.   Pt considered home health physical therapy but the cost of care would be too high ( $125 per visit) and this.   Prior to injury pt was ambulating with rolator. Pt used bedside commode at that time. Pt reports 6 falls during this year. Pt reports 2-3 falls with rolator and 2-3 falls walking between bathroom and bedroom and 1 fall in bedroom.   Pt had another fall at [facility] where she broke her left humerus about 2 years ago.   PAIN:  Are you having pain? No  PRECAUTIONS: Other: WBAT  WEIGHT BEARING RESTRICTIONS     Gardenia Phlegm, MD MPH -  09/03/2022 3:15 PM EDT   Formatting of this note might be different from the original.  At this time, you can continue weightbearing as tolerated to right lower extremity. Please continue working on range of motion and strengthening. We have given you prescription for physical therapy.   FALLS:  Has patient fallen in last 6 months? Yes. 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 Pt does not know if she will be able to sit in one position for that long. Not sure if it will be feasible to get back to work.   PLOF: Requires assistive device for independence, Needs assistance with ADLs, Needs assistance with homemaking, and Needs assistance with gait  PATIENT GOALS Pt would like to get legs moving a little bit more. Pt wants to be able to get to bathroom on her own.  Get upper body strength improved.      TODAY'S TREATMENT: 02/04/23     Seated LAQ AROM on the R and AAROM on the left 2 x 10 each lower extremity Seated hip abduction  10x with ball 10 x no resistance from ball but through larger ROM, AAROM on the left from the PT   Gluteal press down into theraband 2 x 10 reps with RTB in seated position  Seated heel raise 2 x 20 reps ea LE   Marching 3 x 10 repetitions in seated position   Using [place] sit to stand patient tends 3 stands.  Per stand patient had back discomfort and straps were adjusted to account for this.  Second and third stand patient was more comfortable but on second stand patient was able to obtain good erect posture for proper lower extremity weightbearing.  Removed footplate from machine and Patient able  to better weight-bear in full erect posture.  Sit here for 1 minute physical therapist assisted with some anterior weight shift to improve hip positioning and patient reports significant fatigue at this time and request to sit.       PATIENT EDUCATION:  Education details: HEP, POC Person educated: Patient Education method: Explanation Education comprehension: verbalized understanding   HOME EXERCISE PROGRAM: Access Code: [email or web address] URL: [email or web address] Date: 09/28/2022 Prepared by:  Rivka Barbara  Exercises - Seated March  - 1 x daily - 7 x weekly - 2 sets - 10 reps - Seated Long Arc Quad  - 1 x daily - 7 x weekly - 2 sets - 10 reps - Seated Heel Raise  - 1 x daily - 7 x weekly - 2 sets - 20 reps - Seated hip abduction with band  - 1 x daily - 7 x weekly - 2 sets - 10 reps - Seated Hip Adduction Isometrics with Ball  - 1 x daily - 7 x weekly - 2 sets - 10 reps  ASSESSMENT:  CLINICAL IMPRESSION:  Pt presents to PT following several weeks of lack of treatment.  Patient demonstrates improved weakness compared to her last treatment session and also decreased ability to tolerate standing for prolonged periods compared to when she was last seen.  Continue to work on lower extremity strengthening with progression to functional standing and transfers in future sessions.Pt will continue to benefit from skilled physical therapy intervention to address impairments, improve QOL, and attain therapy goals.     OBJECTIVE IMPAIRMENTS Abnormal gait, decreased activity tolerance, decreased balance, decreased endurance, decreased mobility, difficulty walking, decreased ROM, decreased strength, impaired perceived functional ability, and impaired sensation.   ACTIVITY LIMITATIONS carrying, lifting, bending, standing, squatting, stairs, transfers, bed mobility, bathing, toileting, dressing, self feeding, hygiene/grooming, and locomotion level  PARTICIPATION LIMITATIONS: meal prep,  cleaning, laundry, driving, shopping, community activity, occupation, and yard work  PERSONAL FACTORS 3+ comorbidities: Polyneuropathy, hypertension, hyperlipidemia, diabetes mellitus, arthritis  are also affecting patient's functional outcome.   REHAB POTENTIAL: Fair secondary to comorbidities above particularly polyneuropathy affecting her lower extremity strength and muscle activation  CLINICAL DECISION MAKING: Evolving/moderate complexity  EVALUATION COMPLEXITY: Moderate   GOALS: Goals reviewed with patient? Yes  SHORT TERM GOALS: Target date: 10/26/2022     Patient will be independent in home exercise program to improve strength/mobility for better functional independence with ADLs. Baseline: No HEP currently 11/15: Completing Hep regularly.  Goal status: Met    LONG TERM GOALS: Target date: 03/16/2023    Patient will be modified independent for slide board transfers in order to improve her ability to transfer to and from wheelchair as well as bedside commode for improvement in ADLs. Baseline: Patient currently requires assistance with slide board transfers is unable to utilize bedside commode and requires use of catheter and bedpan 11/15: Pt completing slide board transfers and only requires assistance with placing slide board and maneuvering WC 1/3: Patient able to demonstrate modified independent slide board transfer from mat table to wheelchair demonstrating ability to place slide board appropriately and perform transfer with only assistance needed for chair positioning prior to transfer Goal status: MET  2.  Patient will perform sit to and from stand with min assist from physical therapist in order to indicate improved lower extremity functional strength and improved ability to perform functional transfers and functional leg activities within her home. Baseline: Patient unable to stand independently at this time due to lower extremity weakness 11/04/2022: Patient is able to  perform anterior lean and pull on parallel bars and activate gluteal muscles was unable to lift weight from wheelchair Goal status: IN PROGRESS  3.  Patient will improve focus on therapeutic outcome score to 35 or greater in order to indicate improved perceived functional ability Baseline: 19 11/15: 40 12/22/22: 43 Goal status: MET  4.  Patient will perform stand pivot transfer from wheelchair to mat table with moderate assistance or  less in order to improve her ability to perform transfers within the home to improve her overall functional capacity. Baseline: Unable to complete 11/04/2022: Patient is able to perform anterior lean and pull on parallel bars and activate gluteal muscles was unable to lift weight from wheelchair 12/22/22: Able to stand in // bars with B UE support and with 2 x Mod-Max A from PT and with PT stabilizing LE at knee.  Goal status: IN PROGRESS  4.  Pt will improve LEFS by 10 points or more in order to indicate improved LE function and mobility.  Baseline: 9 : 12/22/22: 26 Goal status: MET  5.  Patient will demonstrate ability to stand from elevated mat table with x 1 mod to max assist at rolling walker in order to show progress with stand pivot transfer with caregiver to bedside commode.  Baseline: 1/3: able to stand in // bars with x 2 mod-max A consistently Goal status: INITIAL   6.  Patient will improve LEFS score by 10 points or more in order to indicate improved lower extremity function and mobility  Baseline: 12/23/22: 26  Goal status: INITIAL    PLAN: PT FREQUENCY: 2x/week  PT DURATION: 12 weeks  PLANNED INTERVENTIONS: Therapeutic exercises, Therapeutic activity, Neuromuscular re-education, Balance training, Gait training, Patient/Family education, Self Care, Joint mobilization, Stair training, DME instructions, and Electrical stimulation  PLAN FOR NEXT SESSION: Nustep, standing in // bars as able, LE strength, supine strengthening potentially, sit to stand  transfers from elevated mat table/ plinth using RW and instructing husband in assistance techniques when able ( when he is able to come to appointments, he works during some appointment dates/ times)    Particia Lather PT  02/04/2023, 4:46 PM

## 2023-02-05 ENCOUNTER — Other Ambulatory Visit: Payer: Self-pay | Admitting: Family Medicine

## 2023-02-05 ENCOUNTER — Encounter: Payer: Self-pay | Admitting: Family Medicine

## 2023-02-05 DIAGNOSIS — F5101 Primary insomnia: Secondary | ICD-10-CM

## 2023-02-08 ENCOUNTER — Encounter: Payer: BLUE CROSS/BLUE SHIELD | Admitting: Physical Therapy

## 2023-02-09 ENCOUNTER — Other Ambulatory Visit: Payer: Self-pay | Admitting: Family Medicine

## 2023-02-09 ENCOUNTER — Ambulatory Visit: Payer: BC Managed Care – PPO

## 2023-02-09 DIAGNOSIS — M6281 Muscle weakness (generalized): Secondary | ICD-10-CM | POA: Diagnosis not present

## 2023-02-09 DIAGNOSIS — R2689 Other abnormalities of gait and mobility: Secondary | ICD-10-CM

## 2023-02-09 DIAGNOSIS — R262 Difficulty in walking, not elsewhere classified: Secondary | ICD-10-CM

## 2023-02-09 DIAGNOSIS — D239 Other benign neoplasm of skin, unspecified: Secondary | ICD-10-CM

## 2023-02-09 NOTE — Therapy (Signed)
OUTPATIENT PHYSICAL THERAPY LOWER EXTREMITY TREATMENT    Patient Name: Gabriella Marsh MRN: [id number] DOB:Nov 04, 1966, 57 y.o., female Today's Date: 02/09/2023   PT End of Session - 02/09/23 1429     Visit Number 24    Number of Visits 42    Date for PT Re-Evaluation 03/16/23    Authorization Type BCBS Other    Progress Note Due on Visit 30    PT Start Time 1430    PT Stop Time 1515    PT Time Calculation (min) 45 min    Equipment Utilized During Treatment Gait belt    Activity Tolerance Patient tolerated treatment well;No increased pain    Behavior During Therapy WFL for tasks assessed/performed             Past Medical History:  Diagnosis Date   Arthritis    knees   Blood clotting disorder ([place])    on toe    Degenerative disc disease, lumbar    Diabetes mellitus without complication (HCC)    GERD (gastroesophageal reflux disease)    Headache    daily - AM (has not been able to have SPG blocks lately)   Hyperlipidemia    Hypertension    Neuropathy    feet   Vertigo    3-4x/yr   Past Surgical History:  Procedure Laterality Date   ABDOMINAL HYSTERECTOMY     But still has cervix   AMPUTATION TOE Right 07/24/2021   Procedure: AMPUTATION TOE;  Surgeon: Sharlotte Alamo, DPM;  Location: [facility] ORS;  Service: Podiatry;  Laterality: Right;   CESAREAN SECTION     CHOLECYSTECTOMY     COLONOSCOPY WITH PROPOFOL N/A 02/20/2022   Procedure: COLONOSCOPY WITH PROPOFOL;  Surgeon: [name], MD;  Location: [facility] ENDOSCOPY;  Service: Gastroenterology;  Laterality: N/A;   left arm frature  10/12/2020   OVARIAN CYST SURGERY     PARATHYROIDECTOMY  03/28/2021   Procedure: PARATHYROIDECTOMY AUTOTRANSPLANT;  Surgeon: Fredirick Maudlin, MD;  Location: [facility] ORS;  Service: General;;   right femur surgery Right    SHOULDER SURGERY Left 12/26/014   Dr. Little Ishikawa, [place] N/A 03/28/2021   Procedure: THYROIDECTOMY, total;  Surgeon: Fredirick Maudlin, MD;  Location: [facility]  ORS;  Service: General;  Laterality: N/A;  Provider requesting 3 hours /180 minutes for procedure   TONSILLECTOMY AND ADENOIDECTOMY     UTERINE FIBROID SURGERY     Patient Active Problem List   Diagnosis Date Noted   Hypotension 01/08/2023   Sepsis secondary to UTI ([place]) 01/07/2023   AKI (acute kidney injury) ([place]) 01/07/2023   Hypokalemia 01/07/2023   Neurogenic bladder 01/07/2023   FTT (failure to thrive) in adult 12/09/2022   Asthenia 12/09/2022   Need for influenza vaccination 09/10/2022   Primary insomnia 09/10/2022   Impaired functional mobility, balance, gait, and endurance 06/09/2022   Type 2 diabetes mellitus with diabetic neuropathy, without long-term current use of insulin ([place]) 04/24/2022   Hypothyroidism 04/24/2022   Hyperlipidemia associated with type 2 diabetes mellitus ([place]) 04/24/2022   Fall 04/24/2022   Dizziness 04/24/2022   Iron deficiency anemia 04/24/2022   Colon cancer screening 01/26/2022   Encounter for screening mammogram for malignant neoplasm of breast 01/26/2022   Chronic sensorimotor polyneuropathy with axonal and demyelinating features 01/12/2022   Osteoarthritis of knees (Bilateral) 01/12/2022   Tricompartment osteoarthritis of knees (Bilateral) 01/12/2022    Grade 1 Retrolisthesis (57m) of L5 over S1 01/12/2022   History of proximal humerus fracture (Left) 01/12/2022  Vitamin D deficiency 01/12/2022   Elevated hemoglobin A1c 01/12/2022   Hypochloremia 01/12/2022   Hyperglycemia 01/12/2022   Chronic peripheral neuropathic pain 01/12/2022   Type 2 diabetes mellitus with peripheral neuropathy ([place]) 01/12/2022   Glands swollen 01/09/2022   Acute cough 01/09/2022   Acute non-recurrent pansinusitis 01/09/2022   Rhinorrhea 01/09/2022   Left acute otitis media 01/09/2022   Pharmacologic therapy 11/12/2021   Disorder of skeletal system 11/12/2021   Problems influencing health status 11/12/2021   Chronic hand pain (1ry area of Pain) (Bilateral) (L>R)  11/12/2021   Chronic feet pain (3ry area of Pain) (Bilateral) (L>R) 11/12/2021   Chronic shoulder pain (4th area of Pain) (Left) 11/12/2021   Chronic knee pain (5th area of Pain) (Bilateral) (L>R) 11/12/2021   Lumbar facet syndrome (Bilateral) 11/12/2021   Muscle spasm 10/22/2021   Type 2 diabetes mellitus with microalbuminuria, without long-term current use of insulin ([place]) 10/22/2021   Diabetic foot infection ([place]) 07/22/2021   Depression with anxiety 07/22/2021   Sepsis ([place]) 07/22/2021   COVID-19 virus infection 07/22/2021   Hypocalcemia 07/17/2021   Chronic pain syndrome 07/17/2021   S/P total thyroidectomy 03/28/2021   Thyroid nodule    Schamberg's purpura 10/09/2019   Leg swelling 10/09/2019   Benign positional vertigo 02/03/2016   Common migraine with intractable migraine 06/18/2015   Anxiety 05/04/2015   Adaptation reaction 05/03/2015   DDD (degenerative disc disease), lumbosacral 05/03/2015   Hypercholesteremia 05/03/2015   Insomnia due to medical condition 05/03/2015   Adiposity 05/03/2015   Disorder of peripheral nervous system 05/03/2015   Snores 05/03/2015   Diabetic peripheral neuropathy ([place]) 03/06/2015   Essential hypertension 03/06/2015   Elevated liver function tests 03/06/2015   Obesity 03/06/2015   GERD (gastroesophageal reflux disease) 03/06/2015   Multinodular goiter 03/06/2015   RLS (restless legs syndrome) 03/06/2015   Chronic low back pain (2ry area of Pain) (Bilateral) (L>R) w/o sciatica 03/06/2015   Leg cramps 03/06/2015    PCP: [name], FNP  REFERRING PROVIDER: [name], FNP  REFERRING DIAG: [phone] (ICD-10-CM) - Fall, subsequent encounter  THERAPY DIAG:  Difficulty in walking, not elsewhere classified  Other abnormalities of gait and mobility  Muscle weakness (generalized)  Rationale for Evaluation and Treatment Rehabilitation  ONSET DATE: 06/18/22  SUBJECTIVE:   SUBJECTIVE STATEMENT:  Pt is reporting that she may  end up getting her catheter out tomorrow.  Pt notes that she otherwise is doing fair.     PERTINENT HISTORY:  Pt reports her fall initially occurred in the evening when her husband was not home. Pt got up and had her Rolator and she walked toward her hallway. When she turned around to turn the light on she went completely down on her R leg and broke her femur distally and twisted her knee. When EMT arrived she had to straighten her LE to immobilize it. Pt went to [facility] hospital for the trauma.   Upon arrival she was evaluated and had to get MRI and imaging. Progressed to 06/19/22. Pt had surgery that night. Pt had to have plate and screws in her knee and rod in her femur. Pt was in hospital for 11 days then progressed to [facility] for [facility] days. [facility].   Upon coming home with [facility] pt came home with catheter. Pt came home and was relieved. Pt husband helps with everything doing "everything" for her except she helps with cooking.   Pt considered  home health physical therapy but the cost of care would be too high ( $125 per visit) and this.   Prior to injury pt was ambulating with rolator. Pt used bedside commode at that time. Pt reports 6 falls during this year. Pt reports 2-3 falls with rolator and 2-3 falls walking between bathroom and bedroom and 1 fall in bedroom.   Pt had another fall at [place] where she broke her left humerus about 2 years ago.    PAIN:  Are you having pain? No   PRECAUTIONS: Other: WBAT   WEIGHT BEARING RESTRICTIONS     Gardenia Phlegm, MD MPH - 09/03/2022 3:15 PM EDT   Formatting of this note might be different from the original.  At this time, you can continue weightbearing as tolerated to right lower extremity. Please continue working on range of motion and strengthening. We have given you prescription for physical therapy.   FALLS:  Has patient fallen in last 6 months? Yes. 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 Pt does not know if she will be able to sit in one position for that long. Not sure if it will be feasible to get back to work.   PLOF: Requires assistive device for independence, Needs assistance with ADLs, Needs assistance with homemaking, and Needs assistance with gait  PATIENT GOALS Pt would like to get legs moving a little bit more. Pt wants to be able to get to bathroom on her own.  Get upper body strength improved.      TODAY'S TREATMENT: 02/09/23   Seated marches AROM on the R and AAROM on the left 2 x 10 each lower extremity Seated LAQ AROM on the R and AAROM on the left 2 x 10 each lower extremity Seated hip adduction  10x with ball 10 x no resistance from ball but through larger ROM, AAROM on the left from the PT   Gluteal press down into theraband 2 x 10 reps with GTB in seated position  Seated heel raise with rainbow physioball between knees 2 x 15 reps ea LE   Marching 3 x 10 repetitions in seated position   Attempted to perform STS x5 attempts total.  Pt required +2 assistance with maximal effort, however was able to come more upright on the final two attempts with support under the arms.      PATIENT EDUCATION:  Education details: HEP, POC Person educated: Patient Education method: Explanation Education comprehension: verbalized understanding   HOME EXERCISE PROGRAM: Access Code: [email or web address] URL: [email or web address] Date: 09/28/2022 Prepared by: Rivka Barbara  Exercises - Seated March  - 1 x daily - 7 x weekly - 2 sets - 10 reps - Seated Long Arc Quad  - 1 x daily  - 7 x weekly - 2 sets - 10 reps - Seated Heel Raise  - 1 x daily - 7 x weekly - 2 sets - 20 reps - Seated hip abduction with band  - 1 x daily - 7 x weekly - 2 sets - 10 reps - Seated Hip Adduction Isometrics with Ball  - 1 x daily - 7 x weekly - 2  sets - 10 reps  ASSESSMENT:  CLINICAL IMPRESSION:  Pt responded well to the seated exercises and is requiring less AAROM on the L side at times.  Pt does still require +2 assistance due to the weakness present in the L LE and will continue to be addressed in subsequent sessions.  Pt to continue with exercises that incorporate strengthening of the L LE in order for the pt to be more stable in standing position.   Pt will continue to benefit from skilled therapy to address remaining deficits in order to improve overall QoL and return to PLOF.     OBJECTIVE IMPAIRMENTS Abnormal gait, decreased activity tolerance, decreased balance, decreased endurance, decreased mobility, difficulty walking, decreased ROM, decreased strength, impaired perceived functional ability, and impaired sensation.   ACTIVITY LIMITATIONS carrying, lifting, bending, standing, squatting, stairs, transfers, bed mobility, bathing, toileting, dressing, self feeding, hygiene/grooming, and locomotion level  PARTICIPATION LIMITATIONS: meal prep, cleaning, laundry, driving, shopping, community activity, occupation, and yard work  PERSONAL FACTORS 3+ comorbidities: Polyneuropathy, hypertension, hyperlipidemia, diabetes mellitus, arthritis  are also affecting patient's functional outcome.   REHAB POTENTIAL: Fair secondary to comorbidities above particularly polyneuropathy affecting her lower extremity strength and muscle activation  CLINICAL DECISION MAKING: Evolving/moderate complexity  EVALUATION COMPLEXITY: Moderate   GOALS: Goals reviewed with patient? Yes  SHORT TERM GOALS: Target date: 10/26/2022     Patient will be independent in home exercise program to improve  strength/mobility for better functional independence with ADLs. Baseline: No HEP currently 11/15: Completing Hep regularly.  Goal status: Met    LONG TERM GOALS: Target date: 03/16/2023    Patient will be modified independent for slide board transfers in order to improve her ability to transfer to and from wheelchair as well as bedside commode for improvement in ADLs. Baseline: Patient currently requires assistance with slide board transfers is unable to utilize bedside commode and requires use of catheter and bedpan 11/15: Pt completing slide board transfers and only requires assistance with placing slide board and maneuvering WC 1/3: Patient able to demonstrate modified independent slide board transfer from mat table to wheelchair demonstrating ability to place slide board appropriately and perform transfer with only assistance needed for chair positioning prior to transfer Goal status: MET  2.  Patient will perform sit to and from stand with min assist from physical therapist in order to indicate improved lower extremity functional strength and improved ability to perform functional transfers and functional leg activities within her home. Baseline: Patient unable to stand independently at this time due to lower extremity weakness 11/04/2022: Patient is able to perform anterior lean and pull on parallel bars and activate gluteal muscles was unable to lift weight from wheelchair Goal status: IN PROGRESS  3.  Patient will improve focus on therapeutic outcome score to 35 or greater in order to indicate improved perceived functional ability Baseline: 19 11/15: 40 12/22/22: 43 Goal status: MET  4.  Patient will perform stand pivot transfer from wheelchair to mat table with moderate assistance or less in order to improve her ability to perform transfers within the home to improve her overall functional capacity. Baseline: Unable to complete 11/04/2022: Patient is able to perform anterior lean and pull  on parallel bars and activate gluteal muscles was unable to lift weight from wheelchair 12/22/22: Able to stand in // bars with B UE support and with 2 x Mod-Max A from PT and with PT stabilizing LE at knee.  Goal status: IN PROGRESS  4.  Pt will  improve LEFS by 10 points or more in order to indicate improved LE function and mobility.  Baseline: 9 : 12/22/22: 26 Goal status: MET  5.  Patient will demonstrate ability to stand from elevated mat table with x 1 mod to max assist at rolling walker in order to show progress with stand pivot transfer with caregiver to bedside commode.  Baseline: 1/3: able to stand in // bars with x 2 mod-max A consistently Goal status: INITIAL   6.  Patient will improve LEFS score by 10 points or more in order to indicate improved lower extremity function and mobility  Baseline: 12/23/22: 26  Goal status: INITIAL    PLAN: PT FREQUENCY: 2x/week  PT DURATION: 12 weeks  PLANNED INTERVENTIONS: Therapeutic exercises, Therapeutic activity, Neuromuscular re-education, Balance training, Gait training, Patient/Family education, Self Care, Joint mobilization, Stair training, DME instructions, and Electrical stimulation  PLAN FOR NEXT SESSION:   Nustep, standing in // bars as able, LE strength, supine strengthening potentially, sit to stand transfers from elevated mat table/ plinth using RW and instructing husband in assistance techniques when able ( when he is able to come to appointments, he works during some appointment dates/ times)     Gwenlyn Saran, PT, DPT Physical Therapist - [facility]  02/09/23, 4:40 PM

## 2023-02-09 NOTE — Progress Notes (Unsigned)
02/10/2023 9:56 PM   Gabriella Marsh 08/30/1966 [id number]  Referring provider: [name], [place],  [place] [address]  Urological history: 1.  Urinary retention -Contributing factors of not ambulation, uncontrolled diabetes, lumbar DDD obesity  Chief Complaint  Patient presents with   Urinary Retention    Voiding trial will return at 4     HPI: Gabriella Marsh is a 57 y.o. female who presents today for TOV.  At her visit on January 07, 2023, her blood pressure to be 64/42.  She states that her low pressure was first discovered when she was at Dr. Precious Gilding office for pain management and it was 74/55 at that time.  She contacted her PCPs office and they discovered that she was on 3 medications for her blood pressure (furosemide, triamterene and lisinopril ).  She was told that she was only to be taking the lisinopril, but patient was taking the other medications because refills were given.  She had been feeling better after stopping the furosemide and the triamterene, but last night she felt that she did not have enough urine in her catheter bag so she took a furosemide.  This morning she states she is feeling "weird" and lightheaded.  She was admitted to the hospital for hypotension, UTI and AKI.  She presents today for trial of void.  Foley catheter was removed this am.  She has been voiding well and feels she is emptying well.  Patient denies any modifying or aggravating factors.  Patient denies any gross hematuria, dysuria or suprapubic/flank pain.  Patient denies any fevers, chills, nausea or vomiting.   PVR 72 mL   PMH: Past Medical History:  Diagnosis Date   Arthritis    knees   Blood clotting disorder (HCC)    on toe    Degenerative disc disease, lumbar    Diabetes mellitus without complication (HCC)    GERD (gastroesophageal reflux disease)    Headache    daily - AM (has not been able to have SPG blocks lately)   Hyperlipidemia     Hypertension    Neuropathy    feet   Vertigo    3-4x/yr    Surgical History: Past Surgical History:  Procedure Laterality Date   ABDOMINAL HYSTERECTOMY     But still has cervix   AMPUTATION TOE Right 07/24/2021   Procedure: AMPUTATION TOE;  Surgeon: Sharlotte Alamo, DPM;  Location: [facility] ORS;  Service: Podiatry;  Laterality: Right;   CESAREAN SECTION     CHOLECYSTECTOMY     COLONOSCOPY WITH PROPOFOL N/A 02/20/2022   Procedure: COLONOSCOPY WITH PROPOFOL;  Surgeon: [name], MD;  Location: [facility] ENDOSCOPY;  Service: Gastroenterology;  Laterality: N/A;   left arm frature  10/12/2020   OVARIAN CYST SURGERY     PARATHYROIDECTOMY  03/28/2021   Procedure: PARATHYROIDECTOMY AUTOTRANSPLANT;  Surgeon: Fredirick Maudlin, MD;  Location: [facility] ORS;  Service: General;;   right femur surgery Right    SHOULDER SURGERY Left 12/26/014   Dr. Little Ishikawa, [place] N/A 03/28/2021   Procedure: THYROIDECTOMY, total;  Surgeon: Fredirick Maudlin, MD;  Location: [facility] ORS;  Service: General;  Laterality: N/A;  Provider requesting 3 hours /180 minutes for procedure   TONSILLECTOMY AND ADENOIDECTOMY     UTERINE FIBROID SURGERY      Home Medications:  Allergies as of 02/10/2023       Reactions   Celecoxib Shortness Of Breath   Pregabalin Shortness Of Breath  Buspar [buspirone] Other (See Comments)   Tachycardia   Citalopram Cough   Other Cough   [place] pear trees        Medication List        Accurate as of February 10, 2023 11:59 PM. If you have any questions, ask your nurse or doctor.          STOP taking these medications    hydrOXYzine 50 MG capsule Commonly known as: VISTARIL Stopped by: Arthelia Callicott, PA-C       TAKE these medications    atorvastatin 20 MG tablet Commonly known as: LIPITOR TAKE 1 TABLET(20 MG) BY MOUTH DAILY   clopidogrel 75 MG tablet Commonly known as: PLAVIX TAKE 1 TABLET(75 MG) BY MOUTH DAILY   cyclobenzaprine 5 MG tablet Commonly  known as: FLEXERIL Take 1 tablet (5 mg total) by mouth 3 (three) times daily as needed for muscle spasms.   fluticasone 50 MCG/ACT nasal spray Commonly known as: FLONASE Place 2 sprays into both nostrils daily.   gabapentin 100 MG capsule Commonly known as: NEURONTIN TAKE 2 CAPSULES THREE TIMES A DAY ALONG WITH 600 MG THREE TIMES A DAY FOR A TOTAL DAILY DOSE OF 2400 MG DAILY   gabapentin 600 MG tablet Commonly known as: NEURONTIN TAKE 1 TABLET BY MOUTH 3 TIMES  DAILY   hydrocortisone 1 % lotion Apply 1 Application topically 2 (two) times daily.   Lantus SoloStar 100 UNIT/ML Solostar Pen Generic drug: insulin glargine INJECT 20 UNITS UNDER THE SKIN AT BEDTIME, TITRATE TO FASTING SUGAR OF 130-180 AS DIRECTED What changed: See the new instructions.   levothyroxine 200 MCG tablet Commonly known as: SYNTHROID Take 200 mcg by mouth daily before breakfast.   levothyroxine 25 MCG tablet Commonly known as: SYNTHROID Take 1 tablet (25 mcg total) by mouth daily. Please take WITH your 200 mcg dose. Recommend labs in 6-8 weeks.   lisinopril 5 MG tablet Commonly known as: ZESTRIL Take 5 mg by mouth daily.   Melatonin 10 MG Tabs Take 10 mg by mouth at bedtime.   metFORMIN 500 MG tablet Commonly known as: GLUCOPHAGE Take 2 tablets (1,000 mg total) by mouth 2 (two) times daily with a meal.   nortriptyline 25 MG capsule Commonly known as: PAMELOR TAKE 2 CAPSULES BY MOUTH AT  BEDTIME   omeprazole 20 MG capsule Commonly known as: PRILOSEC TAKE 1 CAPSULE(20 MG) BY MOUTH DAILY   oxyCODONE-acetaminophen 5-325 MG tablet Commonly known as: Percocet Take 1 tablet by mouth every 8 (eight) hours. Must last 30 days.   oxyCODONE-acetaminophen 5-325 MG tablet Commonly known as: Percocet Take 1 tablet by mouth every 8 (eight) hours. Must last 30 days.   oxyCODONE-acetaminophen 5-325 MG tablet Commonly known as: Percocet Take 1 tablet by mouth every 8 (eight) hours. Must last 30  days. Start taking on: March 10, 2023   traZODone 50 MG tablet Commonly known as: DESYREL TAKE 2 TABLETS(100 MG) BY MOUTH AT BEDTIME   Trulicity 3 0000000 Sopn Generic drug: Dulaglutide Inject 3 mg as directed once a week.        Allergies:  Allergies  Allergen Reactions   Celecoxib Shortness Of Breath   Pregabalin Shortness Of Breath   Buspar [Buspirone] Other (See Comments)    Tachycardia   Citalopram Cough   Other Cough    [place] pear trees    Family History: Family History  Problem Relation Age of Onset   Diabetes Father    Heart disease Father  Hypertension Father    Hyperlipidemia Father    Congestive Heart Failure Father    Healthy Brother    Osteoporosis Mother    Irritable bowel syndrome Mother    Osteoporosis Maternal Grandmother     Social History:  reports that she quit smoking about 11 years ago. Her smoking use included cigarettes. She has never used smokeless tobacco. She reports that she does not drink alcohol and does not use drugs.  ROS: Pertinent ROS in HPI  Physical Exam: BP 90/61   Pulse 100   Ht 5' 2"$  (1.575 m)   Wt 215 lb (97.5 kg)   BMI 39.32 kg/m   Constitutional:  Well nourished. Alert and oriented, No acute distress. HEENT: [place] AT, moist mucus membranes.  Trachea midline Cardiovascular: No clubbing, cyanosis, or edema. Respiratory: Normal respiratory effort, no increased work of breathing. Neurologic: Grossly intact, no focal deficits, moving all 4 extremities.  In wheelchair.  Psychiatric: Normal mood and affect.    Laboratory Data: N/A  Pertinent Imaging:  02/10/23 15:51  Scan Result 72    Assessment & Plan:    1. Urinary retention -Foley catheter removed and patient voiding well -reviewed return precaution   Return in about 1 month (around 03/11/2023) for PVR and OAB questionnaire.  These notes generated with voice recognition software. I apologize for typographical errors.  [place], [place] [address]  [place], [place] [address] [phone]

## 2023-02-10 ENCOUNTER — Telehealth: Payer: BC Managed Care – PPO

## 2023-02-10 ENCOUNTER — Ambulatory Visit (INDEPENDENT_AMBULATORY_CARE_PROVIDER_SITE_OTHER): Payer: BC Managed Care – PPO | Admitting: Urology

## 2023-02-10 ENCOUNTER — Encounter: Payer: BC Managed Care – PPO | Admitting: Urology

## 2023-02-10 VITALS — BP 90/61 | HR 100 | Ht 62.0 in | Wt 215.0 lb

## 2023-02-10 DIAGNOSIS — R339 Retention of urine, unspecified: Secondary | ICD-10-CM

## 2023-02-10 LAB — BLADDER SCAN AMB NON-IMAGING: Scan Result: 72

## 2023-02-10 NOTE — Progress Notes (Unsigned)
Catheter Removal  Patient is present today for a catheter removal.  41m of water was drained from the balloon. A 16FR foley cath was removed from the bladder, no complications were noted. Patient tolerated well.  Performed by: HMariam Dollar CMA  Follow up/ Additional notes: 4 pm  on 02/10/2023

## 2023-02-10 NOTE — Telephone Encounter (Signed)
Attempted to do PA on CMM and was unable to get it completed.  Called [facility] and was on hold over 45 mins.  Tried to call patient to get BIN#, PCN# and GRP #. LM for patient to call us back with these numbers.

## 2023-02-11 ENCOUNTER — Encounter: Payer: Self-pay | Admitting: Urology

## 2023-02-11 DIAGNOSIS — I959 Hypotension, unspecified: Secondary | ICD-10-CM | POA: Diagnosis not present

## 2023-02-11 DIAGNOSIS — E7800 Pure hypercholesterolemia, unspecified: Secondary | ICD-10-CM | POA: Diagnosis not present

## 2023-02-11 DIAGNOSIS — R531 Weakness: Secondary | ICD-10-CM | POA: Diagnosis not present

## 2023-02-11 DIAGNOSIS — E119 Type 2 diabetes mellitus without complications: Secondary | ICD-10-CM | POA: Diagnosis not present

## 2023-02-12 ENCOUNTER — Telehealth: Payer: Self-pay | Admitting: Student in an Organized Health Care Education/Training Program

## 2023-02-12 NOTE — Telephone Encounter (Signed)
PT stated that [facility] need a PA done so patient can pick up refilled. Please give patient a call. TY

## 2023-02-15 ENCOUNTER — Ambulatory Visit: Payer: BC Managed Care – PPO

## 2023-02-15 DIAGNOSIS — R2689 Other abnormalities of gait and mobility: Secondary | ICD-10-CM | POA: Diagnosis not present

## 2023-02-15 DIAGNOSIS — M6281 Muscle weakness (generalized): Secondary | ICD-10-CM | POA: Diagnosis not present

## 2023-02-15 DIAGNOSIS — R262 Difficulty in walking, not elsewhere classified: Secondary | ICD-10-CM

## 2023-02-15 NOTE — Therapy (Signed)
OUTPATIENT PHYSICAL THERAPY LOWER EXTREMITY TREATMENT    Patient Name: Gabriella Marsh MRN: [id number] DOB:11-14-1966, 57 y.o., female Today's Date: 02/15/2023   PT End of Session - 02/15/23 1440     Visit Number 25    Number of Visits 42    Date for PT Re-Evaluation 03/16/23    Authorization Type BCBS Other    Progress Note Due on Visit 57    PT Start Time 1433    PT Stop Time 1511    PT Time Calculation (min) 38 min    Equipment Utilized During Treatment Gait belt    Activity Tolerance Patient tolerated treatment well;No increased pain;Patient limited by fatigue    Behavior During Therapy [facility] for tasks assessed/performed             Past Medical History:  Diagnosis Date   Arthritis    knees   Blood clotting disorder ([place])    on toe    Degenerative disc disease, lumbar    Diabetes mellitus without complication (HCC)    GERD (gastroesophageal reflux disease)    Headache    daily - AM (has not been able to have SPG blocks lately)   Hyperlipidemia    Hypertension    Neuropathy    feet   Vertigo    3-4x/yr   Past Surgical History:  Procedure Laterality Date   ABDOMINAL HYSTERECTOMY     But still has cervix   AMPUTATION TOE Right 07/24/2021   Procedure: AMPUTATION TOE;  Surgeon: Sharlotte Alamo, DPM;  Location: [facility] ORS;  Service: Podiatry;  Laterality: Right;   CESAREAN SECTION     CHOLECYSTECTOMY     COLONOSCOPY WITH PROPOFOL N/A 02/20/2022   Procedure: COLONOSCOPY WITH PROPOFOL;  Surgeon: [name], MD;  Location: [facility] ENDOSCOPY;  Service: Gastroenterology;  Laterality: N/A;   left arm frature  10/12/2020   OVARIAN CYST SURGERY     PARATHYROIDECTOMY  03/28/2021   Procedure: PARATHYROIDECTOMY AUTOTRANSPLANT;  Surgeon: Fredirick Maudlin, MD;  Location: [facility] ORS;  Service: General;;   right femur surgery Right    SHOULDER SURGERY Left 12/26/014   Dr. Little Ishikawa, [place] N/A 03/28/2021   Procedure: THYROIDECTOMY, total;  Surgeon: Fredirick Maudlin, MD;  Location: [facility] ORS;  Service: General;  Laterality: N/A;  Provider requesting 3 hours /180 minutes for procedure   TONSILLECTOMY AND ADENOIDECTOMY     UTERINE FIBROID SURGERY     Patient Active Problem List   Diagnosis Date Noted   Hypotension 01/08/2023   Sepsis secondary to UTI ([place]) 01/07/2023   AKI (acute kidney injury) ([place]) 01/07/2023   Hypokalemia 01/07/2023   Neurogenic bladder 01/07/2023   FTT (failure to thrive) in adult 12/09/2022   Asthenia 12/09/2022   Need for influenza vaccination 09/10/2022   Primary insomnia 09/10/2022   Impaired functional mobility, balance, gait, and endurance 06/09/2022   Type 2 diabetes mellitus with diabetic neuropathy, without long-term current use of insulin ([place]) 04/24/2022   Hypothyroidism 04/24/2022   Hyperlipidemia associated with type 2 diabetes mellitus ([place]) 04/24/2022   Fall 04/24/2022   Dizziness 04/24/2022   Iron deficiency anemia 04/24/2022   Colon cancer screening 01/26/2022   Encounter for screening mammogram for malignant neoplasm of breast 01/26/2022   Chronic sensorimotor polyneuropathy with axonal and demyelinating features 01/12/2022   Osteoarthritis of knees (Bilateral) 01/12/2022   Tricompartment osteoarthritis of knees (Bilateral) 01/12/2022    Grade 1 Retrolisthesis (40m) of L5 over S1 01/12/2022   History of proximal humerus fracture (Left)  01/12/2022   Vitamin D deficiency 01/12/2022   Elevated hemoglobin A1c 01/12/2022   Hypochloremia 01/12/2022   Hyperglycemia 01/12/2022   Chronic peripheral neuropathic pain 01/12/2022   Type 2 diabetes mellitus with peripheral neuropathy ([place]) 01/12/2022   Glands swollen 01/09/2022   Acute cough 01/09/2022   Acute non-recurrent pansinusitis 01/09/2022   Rhinorrhea 01/09/2022   Left acute otitis media 01/09/2022   Pharmacologic therapy 11/12/2021   Disorder of skeletal system 11/12/2021   Problems influencing health status 11/12/2021   Chronic hand pain (1ry  area of Pain) (Bilateral) (L>R) 11/12/2021   Chronic feet pain (3ry area of Pain) (Bilateral) (L>R) 11/12/2021   Chronic shoulder pain (4th area of Pain) (Left) 11/12/2021   Chronic knee pain (5th area of Pain) (Bilateral) (L>R) 11/12/2021   Lumbar facet syndrome (Bilateral) 11/12/2021   Muscle spasm 10/22/2021   Type 2 diabetes mellitus with microalbuminuria, without long-term current use of insulin ([place]) 10/22/2021   Diabetic foot infection ([place]) 07/22/2021   Depression with anxiety 07/22/2021   Sepsis ([place]) 07/22/2021   COVID-19 virus infection 07/22/2021   Hypocalcemia 07/17/2021   Chronic pain syndrome 07/17/2021   S/P total thyroidectomy 03/28/2021   Thyroid nodule    Schamberg's purpura 10/09/2019   Leg swelling 10/09/2019   Benign positional vertigo 02/03/2016   Common migraine with intractable migraine 06/18/2015   Anxiety 05/04/2015   Adaptation reaction 05/03/2015   DDD (degenerative disc disease), lumbosacral 05/03/2015   Hypercholesteremia 05/03/2015   Insomnia due to medical condition 05/03/2015   Adiposity 05/03/2015   Disorder of peripheral nervous system 05/03/2015   Snores 05/03/2015   Diabetic peripheral neuropathy ([place]) 03/06/2015   Essential hypertension 03/06/2015   Elevated liver function tests 03/06/2015   Obesity 03/06/2015   GERD (gastroesophageal reflux disease) 03/06/2015   Multinodular goiter 03/06/2015   RLS (restless legs syndrome) 03/06/2015   Chronic low back pain (2ry area of Pain) (Bilateral) (L>R) w/o sciatica 03/06/2015   Leg cramps 03/06/2015    PCP: [name], FNP  REFERRING PROVIDER: [name], FNP  REFERRING DIAG: [phone] (ICD-10-CM) - Fall, subsequent encounter  THERAPY DIAG:  Difficulty in walking, not elsewhere classified  Other abnormalities of gait and mobility  Muscle weakness (generalized)  Rationale for Evaluation and Treatment Rehabilitation  ONSET DATE: 06/18/22  SUBJECTIVE:   SUBJECTIVE  STATEMENT: Pt doing well since catheter out. Has been using bed pan for ADL performance. No other updates since prior session.     PERTINENT HISTORY:  Pt reports her fall initially occurred in the evening when her husband was not home. Pt got up and had her Rolator and she walked toward her hallway. When she turned around to turn the light on she went completely down on her R leg and broke her femur distally and twisted her knee. When EMT arrived she had to straighten her LE to immobilize it. Pt went to [facility] hospital for the trauma. Upon arrival she was evaluated and had to get MRI and imaging. Progressed to 06/19/22. Pt had surgery that night. Pt had to have plate and screws in her knee and rod in her femur. Pt was in hospital for 11 days then progressed to [facility] for 30 days. Upon coming home with [facility] pt came home with catheter.  Pt husband helps with everything doing "everything" for her except she helps with cooking. Pt considered home health physical therapy but the cost of care would be too high ( $125 per visit) and this.  Prior to injury pt was ambulating with rolator. Pt used bedside commode at that time. Pt reports 6 falls during this year. Pt reports 2-3 falls with rollator and 2-3 falls walking between bathroom and bedroom and 1 fall in bedroom. Pt had another fall at [place] where she broke her left humerus about 2 years ago.    PAIN:  Are you having pain? No   PRECAUTIONS: Other: WBAT   WEIGHT BEARING RESTRICTIONS     Gardenia Phlegm, MD MPH - 09/03/2022 3:15 PM EDT   Formatting of this note might be different from the original. At this time, you can continue weightbearing as tolerated to right lower extremity. Please continue working on range of motion and strengthening. We have given you prescription for physical therapy.   FALLS:  Has patient fallen in last 6 months? Yes. Number of falls 6  LIVING ENVIRONMENT: Lives with: lives with their family and  lives with their spouse Lives in: House/apartment Stairs: No Has following equipment at home: Environmental consultant - 2 wheeled, Wheelchair (manual), Shower bench, bed side commode, Grab bars, Ramped entry, and standard walker ( no wheels)  PLOF: Requires assistive device for independence, Needs assistance with ADLs, Needs assistance with homemaking, and Needs assistance with gait  PATIENT GOALS Pt would like to get legs moving a little bit more. Pt wants to be able to get to bathroom on her own.  Get upper body strength improved.      TODAY'S TREATMENT: 02/15/23 Seated marches AROM on the R and AAROM on the left 2 x 10 each lower extremity Seated LAQ AROM on the R and AAROM on the left 2 x 10 each lower extremity Seated RDL, no weight, fingers to hedgehog on yoga block 1x15  Seated ball squeeze 15x3secH  Seated RDL, no weight, fingers to hedgehog on yoga block 1x15  Seated ball squeeze 15x3secH   Heel slides x15 bilat (12 Left) Seated downward row x15 @ 12.5lb Seated triceps rope 1x10@ 12.5  (not lineds up very well for full ROM) Silver physioball russian twist x8 bilat, alternating, depth as tolerated Heel slides x15 bilat (12 left)  Seated downward row x15 @ 17.5lb Silver physioball russian twist x8 bilat, alternating, depth as tolerated        PATIENT EDUCATION:  Education details: HEP, POC Person educated: Patient Education method: Explanation Education comprehension: verbalized understanding   HOME EXERCISE PROGRAM: Access Code: [email or web address] URL: [email or web address] Date: 09/28/2022 Prepared by: Rivka Barbara  Exercises - Seated March  - 1 x daily - 7 x weekly - 2 sets - 10 reps - Seated Long Arc Quad  - 1 x daily - 7 x weekly - 2 sets - 10 reps - Seated Heel Raise  - 1 x daily - 7 x weekly - 2 sets - 20 reps - Seated hip abduction with band  - 1 x daily - 7 x weekly - 2 sets - 10 reps - Seated Hip Adduction Isometrics with Ball  - 1 x daily - 7 x weekly - 2  sets - 10 reps  ASSESSMENT:  CLINICAL IMPRESSION: Heavy emphasis on focal leg strength and core motor control training. Breaks provided as needed due to fatigue. Active assistance requires more often on Left side. Pt will continue to benefit from skilled therapy to address remaining deficits in order to improve overall QoL and return to PLOF.     OBJECTIVE IMPAIRMENTS Abnormal gait, decreased activity tolerance, decreased balance, decreased endurance, decreased mobility, difficulty walking, decreased  ROM, decreased strength, impaired perceived functional ability, and impaired sensation.   ACTIVITY LIMITATIONS carrying, lifting, bending, standing, squatting, stairs, transfers, bed mobility, bathing, toileting, dressing, self feeding, hygiene/grooming, and locomotion level  PARTICIPATION LIMITATIONS: meal prep, cleaning, laundry, driving, shopping, community activity, occupation, and yard work  PERSONAL FACTORS 3+ comorbidities: Polyneuropathy, hypertension, hyperlipidemia, diabetes mellitus, arthritis  are also affecting patient's functional outcome.   REHAB POTENTIAL: Fair secondary to comorbidities above particularly polyneuropathy affecting her lower extremity strength and muscle activation  CLINICAL DECISION MAKING: Evolving/moderate complexity  EVALUATION COMPLEXITY: Moderate   GOALS: Goals reviewed with patient? Yes  SHORT TERM GOALS: Target date: 10/26/2022     Patient will be independent in home exercise program to improve strength/mobility for better functional independence with ADLs. Baseline: No HEP currently 11/15: Completing Hep regularly.  Goal status: Met    LONG TERM GOALS: Target date: 03/16/2023    Patient will be modified independent for slide board transfers in order to improve her ability to transfer to and from wheelchair as well as bedside commode for improvement in ADLs. Baseline: Patient currently requires assistance with slide board transfers is unable to  utilize bedside commode and requires use of catheter and bedpan 11/15: Pt completing slide board transfers and only requires assistance with placing slide board and maneuvering WC 1/3: Patient able to demonstrate modified independent slide board transfer from mat table to wheelchair demonstrating ability to place slide board appropriately and perform transfer with only assistance needed for chair positioning prior to transfer Goal status: MET  2.  Patient will perform sit to and from stand with min assist from physical therapist in order to indicate improved lower extremity functional strength and improved ability to perform functional transfers and functional leg activities within her home. Baseline: Patient unable to stand independently at this time due to lower extremity weakness 11/04/2022: Patient is able to perform anterior lean and pull on parallel bars and activate gluteal muscles was unable to lift weight from wheelchair Goal status: IN PROGRESS  3.  Patient will improve focus on therapeutic outcome score to 35 or greater in order to indicate improved perceived functional ability Baseline: 19 11/15: 40 12/22/22: 43 Goal status: MET  4.  Patient will perform stand pivot transfer from wheelchair to mat table with moderate assistance or less in order to improve her ability to perform transfers within the home to improve her overall functional capacity. Baseline: Unable to complete 11/04/2022: Patient is able to perform anterior lean and pull on parallel bars and activate gluteal muscles was unable to lift weight from wheelchair 12/22/22: Able to stand in // bars with B UE support and with 2 x Mod-Max A from PT and with PT stabilizing LE at knee.  Goal status: IN PROGRESS  4.  Pt will improve LEFS by 10 points or more in order to indicate improved LE function and mobility.  Baseline: 9 : 12/22/22: 26 Goal status: MET  5.  Patient will demonstrate ability to stand from elevated mat table with x 1  mod to max assist at rolling walker in order to show progress with stand pivot transfer with caregiver to bedside commode.  Baseline: 1/3: able to stand in // bars with x 2 mod-max A consistently Goal status: INITIAL   6.  Patient will improve LEFS score by 10 points or more in order to indicate improved lower extremity function and mobility  Baseline: 12/23/22: 26  Goal status: INITIAL    PLAN: PT FREQUENCY: 2x/week  PT DURATION:  12 weeks  PLANNED INTERVENTIONS: Therapeutic exercises, Therapeutic activity, Neuromuscular re-education, Balance training, Gait training, Patient/Family education, Self Care, Joint mobilization, Stair training, DME instructions, and Electrical stimulation  PLAN FOR NEXT SESSION:  Nustep, standing in // bars as able, LE strength, supine strengthening potentially, sit to stand transfers from elevated mat table/ plinth using RW and instructing husband in assistance techniques when able (when he is able to come to appointments, he works during some appointment dates/ times)   2:41 PM, 02/15/23 Etta Grandchild, PT, DPT Physical Therapist - [place] [phone]     02/15/23, 2:41 PM

## 2023-02-15 NOTE — Telephone Encounter (Signed)
PA was done on 02/21/20214.

## 2023-02-17 ENCOUNTER — Encounter: Payer: BLUE CROSS/BLUE SHIELD | Admitting: Physical Therapy

## 2023-02-18 ENCOUNTER — Encounter: Payer: Self-pay | Admitting: Family Medicine

## 2023-02-19 ENCOUNTER — Other Ambulatory Visit: Payer: Self-pay | Admitting: Family Medicine

## 2023-02-19 DIAGNOSIS — E1140 Type 2 diabetes mellitus with diabetic neuropathy, unspecified: Secondary | ICD-10-CM

## 2023-02-19 MED ORDER — GABAPENTIN 100 MG PO CAPS: ORAL_CAPSULE | ORAL | 3 refills | Status: DC

## 2023-02-19 MED ORDER — GABAPENTIN 600 MG PO TABS: 600.0000 mg | ORAL_TABLET | Freq: Three times a day (TID) | ORAL | 3 refills | Status: DC

## 2023-02-22 ENCOUNTER — Ambulatory Visit: Payer: BC Managed Care – PPO | Attending: Orthopedic Surgery | Admitting: Physical Therapy

## 2023-02-22 DIAGNOSIS — R262 Difficulty in walking, not elsewhere classified: Secondary | ICD-10-CM | POA: Diagnosis not present

## 2023-02-22 DIAGNOSIS — R2689 Other abnormalities of gait and mobility: Secondary | ICD-10-CM | POA: Insufficient documentation

## 2023-02-22 DIAGNOSIS — M6281 Muscle weakness (generalized): Secondary | ICD-10-CM | POA: Diagnosis not present

## 2023-02-22 NOTE — Therapy (Signed)
OUTPATIENT PHYSICAL THERAPY LOWER EXTREMITY TREATMENT    Patient Name: Gabriella Marsh MRN: [id number] DOB:February 03, 1966, 57 y.o., female Today's Date: 02/22/2023   PT End of Session - 02/22/23 1607     Visit Number 26    Number of Visits 42    Date for PT Re-Evaluation 03/16/23    Authorization Type BCBS Other    Progress Note Due on Visit 30    PT Start Time 1300    PT Stop Time 1345    PT Time Calculation (min) 45 min    Equipment Utilized During Treatment Gait belt    Activity Tolerance Patient tolerated treatment well;No increased pain;Patient limited by fatigue    Behavior During Therapy [facility] for tasks assessed/performed              Past Medical History:  Diagnosis Date   Arthritis    knees   Blood clotting disorder ([place])    on toe    Degenerative disc disease, lumbar    Diabetes mellitus without complication (HCC)    GERD (gastroesophageal reflux disease)    Headache    daily - AM (has not been able to have SPG blocks lately)   Hyperlipidemia    Hypertension    Neuropathy    feet   Vertigo    3-4x/yr   Past Surgical History:  Procedure Laterality Date   ABDOMINAL HYSTERECTOMY     But still has cervix   AMPUTATION TOE Right 07/24/2021   Procedure: AMPUTATION TOE;  Surgeon: Sharlotte Alamo, DPM;  Location: [facility] ORS;  Service: Podiatry;  Laterality: Right;   CESAREAN SECTION     CHOLECYSTECTOMY     COLONOSCOPY WITH PROPOFOL N/A 02/20/2022   Procedure: COLONOSCOPY WITH PROPOFOL;  Surgeon: [name], MD;  Location: [facility] ENDOSCOPY;  Service: Gastroenterology;  Laterality: N/A;   left arm frature  10/12/2020   OVARIAN CYST SURGERY     PARATHYROIDECTOMY  03/28/2021   Procedure: PARATHYROIDECTOMY AUTOTRANSPLANT;  Surgeon: Fredirick Maudlin, MD;  Location: [facility] ORS;  Service: General;;   right femur surgery Right    SHOULDER SURGERY Left 12/26/014   Dr. Little Ishikawa, [place] N/A 03/28/2021   Procedure: THYROIDECTOMY, total;  Surgeon: Fredirick Maudlin, MD;  Location: [facility] ORS;  Service: General;  Laterality: N/A;  Provider requesting 3 hours /180 minutes for procedure   TONSILLECTOMY AND ADENOIDECTOMY     UTERINE FIBROID SURGERY     Patient Active Problem List   Diagnosis Date Noted   Hypotension 01/08/2023   Sepsis secondary to UTI ([place]) 01/07/2023   AKI (acute kidney injury) ([place]) 01/07/2023   Hypokalemia 01/07/2023   Neurogenic bladder 01/07/2023   FTT (failure to thrive) in adult 12/09/2022   Asthenia 12/09/2022   Need for influenza vaccination 09/10/2022   Primary insomnia 09/10/2022   Impaired functional mobility, balance, gait, and endurance 06/09/2022   Type 2 diabetes mellitus with diabetic neuropathy, without long-term current use of insulin ([place]) 04/24/2022   Hypothyroidism 04/24/2022   Hyperlipidemia associated with type 2 diabetes mellitus ([place]) 04/24/2022   Fall 04/24/2022   Dizziness 04/24/2022   Iron deficiency anemia 04/24/2022   Colon cancer screening 01/26/2022   Encounter for screening mammogram for malignant neoplasm of breast 01/26/2022   Chronic sensorimotor polyneuropathy with axonal and demyelinating features 01/12/2022   Osteoarthritis of knees (Bilateral) 01/12/2022   Tricompartment osteoarthritis of knees (Bilateral) 01/12/2022    Grade 1 Retrolisthesis (11m) of L5 over S1 01/12/2022   History of proximal humerus fracture (  Left) 01/12/2022   Vitamin D deficiency 01/12/2022   Elevated hemoglobin A1c 01/12/2022   Hypochloremia 01/12/2022   Hyperglycemia 01/12/2022   Chronic peripheral neuropathic pain 01/12/2022   Type 2 diabetes mellitus with peripheral neuropathy ([place]) 01/12/2022   Glands swollen 01/09/2022   Acute cough 01/09/2022   Acute non-recurrent pansinusitis 01/09/2022   Rhinorrhea 01/09/2022   Left acute otitis media 01/09/2022   Pharmacologic therapy 11/12/2021   Disorder of skeletal system 11/12/2021   Problems influencing health status 11/12/2021   Chronic hand pain (1ry  area of Pain) (Bilateral) (L>R) 11/12/2021   Chronic feet pain (3ry area of Pain) (Bilateral) (L>R) 11/12/2021   Chronic shoulder pain (4th area of Pain) (Left) 11/12/2021   Chronic knee pain (5th area of Pain) (Bilateral) (L>R) 11/12/2021   Lumbar facet syndrome (Bilateral) 11/12/2021   Muscle spasm 10/22/2021   Type 2 diabetes mellitus with microalbuminuria, without long-term current use of insulin ([place]) 10/22/2021   Diabetic foot infection ([place]) 07/22/2021   Depression with anxiety 07/22/2021   Sepsis ([place]) 07/22/2021   COVID-19 virus infection 07/22/2021   Hypocalcemia 07/17/2021   Chronic pain syndrome 07/17/2021   S/P total thyroidectomy 03/28/2021   Thyroid nodule    Schamberg's purpura 10/09/2019   Leg swelling 10/09/2019   Benign positional vertigo 02/03/2016   Common migraine with intractable migraine 06/18/2015   Anxiety 05/04/2015   Adaptation reaction 05/03/2015   DDD (degenerative disc disease), lumbosacral 05/03/2015   Hypercholesteremia 05/03/2015   Insomnia due to medical condition 05/03/2015   Adiposity 05/03/2015   Disorder of peripheral nervous system 05/03/2015   Snores 05/03/2015   Diabetic peripheral neuropathy ([place]) 03/06/2015   Essential hypertension 03/06/2015   Elevated liver function tests 03/06/2015   Obesity 03/06/2015   GERD (gastroesophageal reflux disease) 03/06/2015   Multinodular goiter 03/06/2015   RLS (restless legs syndrome) 03/06/2015   Chronic low back pain (2ry area of Pain) (Bilateral) (L>R) w/o sciatica 03/06/2015   Leg cramps 03/06/2015    PCP: [name], FNP  REFERRING PROVIDER: [name], FNP  REFERRING DIAG: [phone] (ICD-10-CM) - Fall, subsequent encounter  THERAPY DIAG:  Difficulty in walking, not elsewhere classified  Other abnormalities of gait and mobility  Muscle weakness (generalized)  Rationale for Evaluation and Treatment Rehabilitation  ONSET DATE: 06/18/22  SUBJECTIVE:   SUBJECTIVE  STATEMENT:  Pt doing well since catheter out. Has been continuing with her leg exercises and has dropped off her shoes for orthotic fit.   PERTINENT HISTORY:  Pt reports her fall initially occurred in the evening when her husband was not home. Pt got up and had her Rolator and she walked toward her hallway. When she turned around to turn the light on she went completely down on her R leg and broke her femur distally and twisted her knee. When EMT arrived she had to straighten her LE to immobilize it. Pt went to [facility] hospital for the trauma. Upon arrival she was evaluated and had to get MRI and imaging. Progressed to 06/19/22. Pt had surgery that night. Pt had to have plate and screws in her knee and rod in her femur. Pt was in hospital for 11 days then progressed to [facility] for 30 days. Upon coming home with [facility] pt came home with catheter.  Pt husband helps with everything doing "everything" for her except she helps with cooking. Pt considered home health physical therapy but the cost of care would be too high ( $125 per visit)  and this. Prior to injury pt was ambulating with rolator. Pt used bedside commode at that time. Pt reports 6 falls during this year. Pt reports 2-3 falls with rollator and 2-3 falls walking between bathroom and bedroom and 1 fall in bedroom. Pt had another fall at [facility] where she broke her left humerus about 2 years ago.    PAIN:  Are you having pain? No   PRECAUTIONS: Other: WBAT   WEIGHT BEARING RESTRICTIONS     Gardenia Phlegm, MD MPH - 09/03/2022 3:15 PM EDT   Formatting of this note might be different from the original. At this time, you can continue weightbearing as tolerated to right lower extremity. Please continue working on range of motion and strengthening. We have given you prescription for physical therapy.   FALLS:  Has patient fallen in last 6 months? Yes. Number of falls 6  LIVING ENVIRONMENT: Lives with: lives with their  family and lives with their spouse Lives in: House/apartment Stairs: No Has following equipment at home: Environmental consultant - 2 wheeled, Wheelchair (manual), Shower bench, bed side commode, Grab bars, Ramped entry, and standard walker ( no wheels)  PLOF: Requires assistive device for independence, Needs assistance with ADLs, Needs assistance with homemaking, and Needs assistance with gait  PATIENT GOALS Pt would like to get legs moving a little bit more. Pt wants to be able to get to bathroom on her own.  Get upper body strength improved.      TODAY'S TREATMENT: 02/22/23 TA  Slideboard transfer from [facility] to adjustable mat table, raised mat table to height where feet are able to be flat on floor but barely STS with standard walker and x 2 min A in transition, pt unable to fully streighten L knee holds for 45 seconds - second trial only able to hold 30 seconds and was a struggle, also required mod A x 2 to get to standing.   TE Seated marches AROM on the R and AAROM on the left 2 x 10 with 2.5# AW  Seated LAQ AROM on the R and AAROM on the left 2 x 10 each lower extremity Seated RDL, no weight, fingers to hedgehog on yoga block 1x15  Seated ball squeeze 15x3secH  Seated RDL, no weight, fingers to hedgehog on yoga block 1x10 anterior then 5 anterolateral  - difficulty with leaning forward, uses some UE support  for stability, gait belt for pt comfort Seated ball squeeze 15x3secH  Heel slides x15 bilat         PATIENT EDUCATION:  Education details: Pt educated throughout session about proper posture and technique with exercises. Improved exercise technique, movement at target joints, use of target muscles after min to mod verbal, visual, tactile cues.  Person educated: Patient Education method: Explanation Education comprehension: verbalized understanding   HOME EXERCISE PROGRAM: Access Code: [email or web address] URL: [email or web address] Date: 09/28/2022 Prepared by: Rivka Barbara  Exercises - Seated March  - 1 x daily - 7 x weekly - 2 sets - 10 reps - Seated Long Arc Quad  - 1 x daily - 7 x weekly - 2 sets - 10 reps - Seated Heel Raise  - 1 x daily - 7 x weekly - 2 sets - 20 reps - Seated hip abduction with band  - 1 x daily - 7 x weekly - 2 sets - 10 reps - Seated Hip Adduction Isometrics with Ball  - 1 x daily - 7 x weekly - 2 sets - 10 reps  ASSESSMENT:  CLINICAL IMPRESSION: Pt demonstrates excellent motivation for PT activities. Pt able to stand with min A x 2 from elevated plinth with standard walker with good stability on first round but fatigued quickly. Will continue to need to work on standing endurance in future session to progress to standing transfers again. Pt will continue to benefit from skilled physical therapy intervention to address impairments, improve QOL, and attain therapy goals.     OBJECTIVE IMPAIRMENTS Abnormal gait, decreased activity tolerance, decreased balance, decreased endurance, decreased mobility, difficulty walking, decreased ROM, decreased strength, impaired perceived functional ability, and impaired sensation.   ACTIVITY LIMITATIONS carrying, lifting, bending, standing, squatting, stairs, transfers, bed mobility, bathing, toileting, dressing, self feeding, hygiene/grooming, and locomotion level  PARTICIPATION LIMITATIONS: meal prep, cleaning, laundry, driving, shopping, community activity, occupation, and yard work  PERSONAL FACTORS 3+ comorbidities: Polyneuropathy, hypertension, hyperlipidemia, diabetes mellitus, arthritis  are also affecting patient's functional outcome.   REHAB POTENTIAL: Fair secondary to comorbidities above particularly polyneuropathy affecting her lower extremity strength and muscle activation  CLINICAL DECISION MAKING: Evolving/moderate complexity  EVALUATION COMPLEXITY: Moderate   GOALS: Goals reviewed with patient? Yes  SHORT TERM GOALS: Target date: 10/26/2022     Patient will be  independent in home exercise program to improve strength/mobility for better functional independence with ADLs. Baseline: No HEP currently 11/15: Completing Hep regularly.  Goal status: Met    LONG TERM GOALS: Target date: 03/16/2023    Patient will be modified independent for slide board transfers in order to improve her ability to transfer to and from wheelchair as well as bedside commode for improvement in ADLs. Baseline: Patient currently requires assistance with slide board transfers is unable to utilize bedside commode and requires use of catheter and bedpan 11/15: Pt completing slide board transfers and only requires assistance with placing slide board and maneuvering WC 1/3: Patient able to demonstrate modified independent slide board transfer from mat table to wheelchair demonstrating ability to place slide board appropriately and perform transfer with only assistance needed for chair positioning prior to transfer Goal status: MET  2.  Patient will perform sit to and from stand with min assist from physical therapist in order to indicate improved lower extremity functional strength and improved ability to perform functional transfers and functional leg activities within her home. Baseline: Patient unable to stand independently at this time due to lower extremity weakness 11/04/2022: Patient is able to perform anterior lean and pull on parallel bars and activate gluteal muscles was unable to lift weight from wheelchair Goal status: IN PROGRESS  3.  Patient will improve focus on therapeutic outcome score to 35 or greater in order to indicate improved perceived functional ability Baseline: 19 11/15: 40 12/22/22: 43 Goal status: MET  4.  Patient will perform stand pivot transfer from wheelchair to mat table with moderate assistance or less in order to improve her ability to perform transfers within the home to improve her overall functional capacity. Baseline: Unable to complete 11/04/2022:  Patient is able to perform anterior lean and pull on parallel bars and activate gluteal muscles was unable to lift weight from wheelchair 12/22/22: Able to stand in // bars with B UE support and with 2 x Mod-Max A from PT and with PT stabilizing LE at knee.  Goal status: IN PROGRESS  4.  Pt will improve LEFS by 10 points or more in order to indicate improved LE function and mobility.  Baseline: 9 : 12/22/22: 26 Goal status: MET  5.  Patient will  demonstrate ability to stand from elevated mat table with x 1 mod to max assist at rolling walker in order to show progress with stand pivot transfer with caregiver to bedside commode.  Baseline: 1/3: able to stand in // bars with x 2 mod-max A consistently Goal status: INITIAL   6.  Patient will improve LEFS score by 10 points or more in order to indicate improved lower extremity function and mobility  Baseline: 12/23/22: 26  Goal status: INITIAL    PLAN: PT FREQUENCY: 2x/week  PT DURATION: 12 weeks  PLANNED INTERVENTIONS: Therapeutic exercises, Therapeutic activity, Neuromuscular re-education, Balance training, Gait training, Patient/Family education, Self Care, Joint mobilization, Stair training, DME instructions, and Electrical stimulation  PLAN FOR NEXT SESSION:  Nustep, standing in // bars as able, LE strength, supine strengthening potentially, sit to stand transfers from elevated mat table/ plinth using RW and instructing husband in assistance techniques when able (when he is able to come to appointments, he works during some appointment dates/ times)   4:08 PM, 02/22/23 Particia Lather PT ,DPT Physical Therapist- [facility]     02/22/23, 4:08 PM

## 2023-02-25 ENCOUNTER — Encounter: Payer: BLUE CROSS/BLUE SHIELD | Admitting: Physical Therapy

## 2023-03-01 ENCOUNTER — Encounter: Payer: BLUE CROSS/BLUE SHIELD | Admitting: Physical Therapy

## 2023-03-02 ENCOUNTER — Telehealth: Payer: Self-pay

## 2023-03-02 NOTE — Telephone Encounter (Signed)
Nira Conn, [facility] calling from [facility] wanting to make sure it was ok to fill the rx for gabapentin since the pt has an allergy on file for Lyrica. Advised her reviewing chart, pt has been on gabapentin for a while now with no issues. She verbalized understanding and will go ahead and fill. No further assistance needed.

## 2023-03-04 ENCOUNTER — Ambulatory Visit: Payer: BC Managed Care – PPO | Admitting: Physical Therapy

## 2023-03-04 ENCOUNTER — Encounter: Payer: Self-pay | Admitting: Physical Therapy

## 2023-03-04 DIAGNOSIS — R2689 Other abnormalities of gait and mobility: Secondary | ICD-10-CM | POA: Diagnosis not present

## 2023-03-04 DIAGNOSIS — R262 Difficulty in walking, not elsewhere classified: Secondary | ICD-10-CM

## 2023-03-04 DIAGNOSIS — M6281 Muscle weakness (generalized): Secondary | ICD-10-CM | POA: Diagnosis not present

## 2023-03-04 NOTE — Therapy (Signed)
OUTPATIENT PHYSICAL THERAPY LOWER EXTREMITY TREATMENT    Patient Name: Gabriella Marsh MRN: [id number] DOB:06-10-1966, 57 y.o., female Today's Date: 03/05/2023   PT End of Session - 03/04/23 1547     Visit Number 27    Number of Visits 42    Date for PT Re-Evaluation 03/16/23    Authorization Type BCBS Other    Progress Note Due on Visit 30    PT Start Time 1600    PT Stop Time 1640    PT Time Calculation (min) 40 min    Equipment Utilized During Treatment Gait belt    Activity Tolerance Patient tolerated treatment well;No increased pain;Patient limited by fatigue    Behavior During Therapy [facility] for tasks assessed/performed               Past Medical History:  Diagnosis Date   Arthritis    knees   Blood clotting disorder ([place])    on toe    Degenerative disc disease, lumbar    Diabetes mellitus without complication (HCC)    GERD (gastroesophageal reflux disease)    Headache    daily - AM (has not been able to have SPG blocks lately)   Hyperlipidemia    Hypertension    Neuropathy    feet   Vertigo    3-4x/yr   Past Surgical History:  Procedure Laterality Date   ABDOMINAL HYSTERECTOMY     But still has cervix   AMPUTATION TOE Right 07/24/2021   Procedure: AMPUTATION TOE;  Surgeon: Sharlotte Alamo, DPM;  Location: [facility] ORS;  Service: Podiatry;  Laterality: Right;   CESAREAN SECTION     CHOLECYSTECTOMY     COLONOSCOPY WITH PROPOFOL N/A 02/20/2022   Procedure: COLONOSCOPY WITH PROPOFOL;  Surgeon: [name], MD;  Location: [facility] ENDOSCOPY;  Service: Gastroenterology;  Laterality: N/A;   left arm frature  10/12/2020   OVARIAN CYST SURGERY     PARATHYROIDECTOMY  03/28/2021   Procedure: PARATHYROIDECTOMY AUTOTRANSPLANT;  Surgeon: Fredirick Maudlin, MD;  Location: [facility] ORS;  Service: General;;   right femur surgery Right    SHOULDER SURGERY Left 12/26/014   Dr. Little Ishikawa, [place] N/A 03/28/2021   Procedure: THYROIDECTOMY, total;  Surgeon: Fredirick Maudlin, MD;  Location: [facility] ORS;  Service: General;  Laterality: N/A;  Provider requesting 3 hours /180 minutes for procedure   TONSILLECTOMY AND ADENOIDECTOMY     UTERINE FIBROID SURGERY     Patient Active Problem List   Diagnosis Date Noted   Hypotension 01/08/2023   Sepsis secondary to UTI ([place]) 01/07/2023   AKI (acute kidney injury) ([place]) 01/07/2023   Hypokalemia 01/07/2023   Neurogenic bladder 01/07/2023   FTT (failure to thrive) in adult 12/09/2022   Asthenia 12/09/2022   Need for influenza vaccination 09/10/2022   Primary insomnia 09/10/2022   Impaired functional mobility, balance, gait, and endurance 06/09/2022   Type 2 diabetes mellitus with diabetic neuropathy, without long-term current use of insulin ([place]) 04/24/2022   Hypothyroidism 04/24/2022   Hyperlipidemia associated with type 2 diabetes mellitus ([place]) 04/24/2022   Fall 04/24/2022   Dizziness 04/24/2022   Iron deficiency anemia 04/24/2022   Colon cancer screening 01/26/2022   Encounter for screening mammogram for malignant neoplasm of breast 01/26/2022   Chronic sensorimotor polyneuropathy with axonal and demyelinating features 01/12/2022   Osteoarthritis of knees (Bilateral) 01/12/2022   Tricompartment osteoarthritis of knees (Bilateral) 01/12/2022    Grade 1 Retrolisthesis (29mm) of L5 over S1 01/12/2022   History of proximal humerus  fracture (Left) 01/12/2022   Vitamin D deficiency 01/12/2022   Elevated hemoglobin A1c 01/12/2022   Hypochloremia 01/12/2022   Hyperglycemia 01/12/2022   Chronic peripheral neuropathic pain 01/12/2022   Type 2 diabetes mellitus with peripheral neuropathy ([place]) 01/12/2022   Glands swollen 01/09/2022   Acute cough 01/09/2022   Acute non-recurrent pansinusitis 01/09/2022   Rhinorrhea 01/09/2022   Left acute otitis media 01/09/2022   Pharmacologic therapy 11/12/2021   Disorder of skeletal system 11/12/2021   Problems influencing health status 11/12/2021   Chronic hand pain (1ry  area of Pain) (Bilateral) (L>R) 11/12/2021   Chronic feet pain (3ry area of Pain) (Bilateral) (L>R) 11/12/2021   Chronic shoulder pain (4th area of Pain) (Left) 11/12/2021   Chronic knee pain (5th area of Pain) (Bilateral) (L>R) 11/12/2021   Lumbar facet syndrome (Bilateral) 11/12/2021   Muscle spasm 10/22/2021   Type 2 diabetes mellitus with microalbuminuria, without long-term current use of insulin ([place]) 10/22/2021   Diabetic foot infection ([place]) 07/22/2021   Depression with anxiety 07/22/2021   Sepsis ([place]) 07/22/2021   COVID-19 virus infection 07/22/2021   Hypocalcemia 07/17/2021   Chronic pain syndrome 07/17/2021   S/P total thyroidectomy 03/28/2021   Thyroid nodule    Schamberg's purpura 10/09/2019   Leg swelling 10/09/2019   Benign positional vertigo 02/03/2016   Common migraine with intractable migraine 06/18/2015   Anxiety 05/04/2015   Adaptation reaction 05/03/2015   DDD (degenerative disc disease), lumbosacral 05/03/2015   Hypercholesteremia 05/03/2015   Insomnia due to medical condition 05/03/2015   Adiposity 05/03/2015   Disorder of peripheral nervous system 05/03/2015   Snores 05/03/2015   Diabetic peripheral neuropathy ([place]) 03/06/2015   Essential hypertension 03/06/2015   Elevated liver function tests 03/06/2015   Obesity 03/06/2015   GERD (gastroesophageal reflux disease) 03/06/2015   Multinodular goiter 03/06/2015   RLS (restless legs syndrome) 03/06/2015   Chronic low back pain (2ry area of Pain) (Bilateral) (L>R) w/o sciatica 03/06/2015   Leg cramps 03/06/2015    PCP: [name], FNP  REFERRING PROVIDER: [name], FNP  REFERRING DIAG: [phone] (ICD-10-CM) - Fall, subsequent encounter  THERAPY DIAG:  Difficulty in walking, not elsewhere classified  Other abnormalities of gait and mobility  Muscle weakness (generalized)  Rationale for Evaluation and Treatment Rehabilitation  ONSET DATE: 06/18/22  SUBJECTIVE:   SUBJECTIVE  STATEMENT:  Pt reports continued difficulty getting her LE AFO and KAFOs. No other changes sice last session. Has been working diligently with her HEP.   PERTINENT HISTORY:  Pt reports her fall initially occurred in the evening when her husband was not home. Pt got up and had her Rolator and she walked toward her hallway. When she turned around to turn the light on she went completely down on her R leg and broke her femur distally and twisted her knee. When EMT arrived she had to straighten her LE to immobilize it. Pt went to [facility] hospital for the trauma. Upon arrival she was evaluated and had to get MRI and imaging. Progressed to 06/19/22. Pt had surgery that night. Pt had to have plate and screws in her knee and rod in her femur. Pt was in hospital for 11 days then progressed to [facility] for 30 days. Upon coming home with [facility] pt came home with catheter.  Pt husband helps with everything doing "everything" for her except she helps with cooking. Pt considered home health physical therapy but the cost of care would be too high ( $125  per visit) and this. Prior to injury pt was ambulating with rolator. Pt used bedside commode at that time. Pt reports 6 falls during this year. Pt reports 2-3 falls with rollator and 2-3 falls walking between bathroom and bedroom and 1 fall in bedroom. Pt had another fall at [place] where she broke her left humerus about 2 years ago.    PAIN:  Are you having pain? No   PRECAUTIONS: Other: WBAT   WEIGHT BEARING RESTRICTIONS     Gardenia Phlegm, MD MPH - 09/03/2022 3:15 PM EDT   Formatting of this note might be different from the original. At this time, you can continue weightbearing as tolerated to right lower extremity. Please continue working on range of motion and strengthening. We have given you prescription for physical therapy.   FALLS:  Has patient fallen in last 6 months? Yes. Number of falls 6  LIVING ENVIRONMENT: Lives with: lives  with their family and lives with their spouse Lives in: House/apartment Stairs: No Has following equipment at home: Environmental consultant - 2 wheeled, Wheelchair (manual), Shower bench, bed side commode, Grab bars, Ramped entry, and standard walker ( no wheels)  PLOF: Requires assistive device for independence, Needs assistance with ADLs, Needs assistance with homemaking, and Needs assistance with gait  PATIENT GOALS Pt would like to get legs moving a little bit more. Pt wants to be able to get to bathroom on her own.  Get upper body strength improved.      TODAY'S TREATMENT: 03/05/23 TA  Slideboard transfer from [facility] to adjustable mat table, raised mat table to height where feet are able to be flat on floor but barely Performed sit to stand transition with to physical therapist assistance 1 min assist on the right and 1 mod assist on the left with cues for forward weight shift as well as education to caregiver regarding what physical therapist on the left side was doing specifically.  Then transition to caregiver assisting on the left and weaker side.  Performed successful sit to stand transfer in this manner and patient stood for approximately 30 seconds prior to seated rest break.  Following seated rest break attempted another sit to stand physical therapist provided cues to caregiver regarding hand placement on gait belt as well as foot placement and patient Caryl Pina had difficulty but then with cues for forward scooting to edge of mat table and forward weight shift patient was able to transition to standing for the third time was only able to maintain 30 seconds due to fatigue.  TE Seated marches AROM on the R and AROM on the left ( min) with 1# AW 2 x 10 with 3# AW  Seated LAQ AROM on the R left ( min ROM ) with 1# AW 2 x 15 with 3# AW  Transfer with slide board to [facility] then to Nustep with Max A  Nustep with LLE hip stabilizer x 5 min level 1 and seat level 10, assistance with R LE hip stabilization and pt  uses UE only when needed ( 90% using LE movement)  Note: Portions of this document were prepared using Dragon voice recognition software and although reviewed may contain unintentional dictation errors in syntax, grammar, or spelling.        PATIENT EDUCATION:  Education details: Pt educated throughout session about proper posture and technique with exercises. Improved exercise technique, movement at target joints, use of target muscles after min to mod verbal, visual, tactile cues.  Person educated: Patient Education  method: Explanation Education comprehension: verbalized understanding   HOME EXERCISE PROGRAM: Access Code: [email or web address] URL: [email or web address] Date: 09/28/2022 Prepared by: Rivka Barbara  Exercises - Seated March  - 1 x daily - 7 x weekly - 2 sets - 10 reps - Seated Long Arc Quad  - 1 x daily - 7 x weekly - 2 sets - 10 reps - Seated Heel Raise  - 1 x daily - 7 x weekly - 2 sets - 20 reps - Seated hip abduction with band  - 1 x daily - 7 x weekly - 2 sets - 10 reps - Seated Hip Adduction Isometrics with Ball  - 1 x daily - 7 x weekly - 2 sets - 10 reps  ASSESSMENT:  CLINICAL IMPRESSION: Pt demonstrates excellent motivation for PT activities. Pt able to stand with min A- mod x 2 from elevated plinth with standard walker with good stability and her husband was able to assist on several repetitions for carryover at home. Pt and caregiver verbalize understanding of importance of this. Pt showing progress with LE strength showing great carryover with home program. Much better LE muscle activation this date and increased resistance and reps with many activities. Pt will continue to benefit from skilled physical therapy intervention to address impairments, improve QOL, and attain therapy goals.     OBJECTIVE IMPAIRMENTS Abnormal gait, decreased activity tolerance, decreased balance, decreased endurance, decreased mobility, difficulty walking, decreased ROM,  decreased strength, impaired perceived functional ability, and impaired sensation.   ACTIVITY LIMITATIONS carrying, lifting, bending, standing, squatting, stairs, transfers, bed mobility, bathing, toileting, dressing, self feeding, hygiene/grooming, and locomotion level  PARTICIPATION LIMITATIONS: meal prep, cleaning, laundry, driving, shopping, community activity, occupation, and yard work  PERSONAL FACTORS 3+ comorbidities: Polyneuropathy, hypertension, hyperlipidemia, diabetes mellitus, arthritis  are also affecting patient's functional outcome.   REHAB POTENTIAL: Fair secondary to comorbidities above particularly polyneuropathy affecting her lower extremity strength and muscle activation  CLINICAL DECISION MAKING: Evolving/moderate complexity  EVALUATION COMPLEXITY: Moderate   GOALS: Goals reviewed with patient? Yes  SHORT TERM GOALS: Target date: 10/26/2022     Patient will be independent in home exercise program to improve strength/mobility for better functional independence with ADLs. Baseline: No HEP currently 11/15: Completing Hep regularly.  Goal status: Met    LONG TERM GOALS: Target date: 03/16/2023    Patient will be modified independent for slide board transfers in order to improve her ability to transfer to and from wheelchair as well as bedside commode for improvement in ADLs. Baseline: Patient currently requires assistance with slide board transfers is unable to utilize bedside commode and requires use of catheter and bedpan 11/15: Pt completing slide board transfers and only requires assistance with placing slide board and maneuvering WC 1/3: Patient able to demonstrate modified independent slide board transfer from mat table to wheelchair demonstrating ability to place slide board appropriately and perform transfer with only assistance needed for chair positioning prior to transfer Goal status: MET  2.  Patient will perform sit to and from stand with min assist from  physical therapist in order to indicate improved lower extremity functional strength and improved ability to perform functional transfers and functional leg activities within her home. Baseline: Patient unable to stand independently at this time due to lower extremity weakness 11/04/2022: Patient is able to perform anterior lean and pull on parallel bars and activate gluteal muscles was unable to lift weight from wheelchair Goal status: IN PROGRESS  3.  Patient will improve focus on therapeutic outcome score  to 35 or greater in order to indicate improved perceived functional ability Baseline: 19 11/15: 40 12/22/22: 43 Goal status: MET  4.  Patient will perform stand pivot transfer from wheelchair to mat table with moderate assistance or less in order to improve her ability to perform transfers within the home to improve her overall functional capacity. Baseline: Unable to complete 11/04/2022: Patient is able to perform anterior lean and pull on parallel bars and activate gluteal muscles was unable to lift weight from wheelchair 12/22/22: Able to stand in // bars with B UE support and with 2 x Mod-Max A from PT and with PT stabilizing LE at knee.  Goal status: IN PROGRESS  4.  Pt will improve LEFS by 10 points or more in order to indicate improved LE function and mobility.  Baseline: 9 : 12/22/22: 26 Goal status: MET  5.  Patient will demonstrate ability to stand from elevated mat table with x 1 mod to max assist at rolling walker in order to show progress with stand pivot transfer with caregiver to bedside commode.  Baseline: 1/3: able to stand in // bars with x 2 mod-max A consistently Goal status: INITIAL   6.  Patient will improve LEFS score by 10 points or more in order to indicate improved lower extremity function and mobility  Baseline: 12/23/22: 26  Goal status: INITIAL    PLAN: PT FREQUENCY: 2x/week  PT DURATION: 12 weeks  PLANNED INTERVENTIONS: Therapeutic exercises, Therapeutic  activity, Neuromuscular re-education, Balance training, Gait training, Patient/Family education, Self Care, Joint mobilization, Stair training, DME instructions, and Electrical stimulation  PLAN FOR NEXT SESSION:  Nustep, standing in // bars as able, LE strength, supine strengthening potentially, sit to stand transfers from elevated mat table/ plinth using RW  8:15 AM, 03/05/23 Particia Lather PT ,DPT Physical Therapist- [facility]     03/05/23, 8:15 AM

## 2023-03-08 ENCOUNTER — Encounter: Payer: BLUE CROSS/BLUE SHIELD | Admitting: Physical Therapy

## 2023-03-09 NOTE — Progress Notes (Unsigned)
03/11/2023 9:17 AM   Gabriella Marsh [birth year] [id number]  Referring provider: [name], [address],  [place] [address]  Urological history: 1.  Urinary retention -Contributing factors of not ambulating, uncontrolled diabetes, lumbar DDD obesity  Chief Complaint  Patient presents with   Over Active Bladder    HPI: Gabriella Marsh is a 57 y.o. female who presents today for a one month follow up after successfully passing a trial of void last month.  She is having 1-7 daytime voids with nocturia 1-2 with a strong urge to urinate.  She does engage in toilet mapping, but mostly due to her nonambulatory status.  Patient denies any modifying or aggravating factors.  Patient denies any gross hematuria, dysuria or suprapubic/flank pain.  Patient denies any fevers, chills, nausea or vomiting.    PVR 434 mL   We transferred her to the commode in the room and she voided 400 cc into that toilet.  PMH: Past Medical History:  Diagnosis Date   Arthritis    knees   Blood clotting disorder ([place])    on toe    Degenerative disc disease, lumbar    Diabetes mellitus without complication (HCC)    GERD (gastroesophageal reflux disease)    Headache    daily - AM (has not been able to have SPG blocks lately)   Hyperlipidemia    Hypertension    Neuropathy    feet   Vertigo    3-4x/yr    Surgical History: Past Surgical History:  Procedure Laterality Date   ABDOMINAL HYSTERECTOMY     But still has cervix   AMPUTATION TOE Right 07/24/2021   Procedure: AMPUTATION TOE;  Surgeon: Sharlotte Alamo, DPM;  Location: [facility] ORS;  Service: Podiatry;  Laterality: Right;   CESAREAN SECTION     CHOLECYSTECTOMY     COLONOSCOPY WITH PROPOFOL N/A 02/20/2022   Procedure: COLONOSCOPY WITH PROPOFOL;  Surgeon: [name], MD;  Location: [facility] ENDOSCOPY;  Service: Gastroenterology;  Laterality: N/A;   left arm frature  10/12/2020   OVARIAN CYST SURGERY      PARATHYROIDECTOMY  03/28/2021   Procedure: PARATHYROIDECTOMY AUTOTRANSPLANT;  Surgeon: Fredirick Maudlin, MD;  Location: [facility] ORS;  Service: General;;   right femur surgery Right    SHOULDER SURGERY Left 12/26/014   Dr. Little Ishikawa, [place] N/A 03/28/2021   Procedure: THYROIDECTOMY, total;  Surgeon: Fredirick Maudlin, MD;  Location: [facility] ORS;  Service: General;  Laterality: N/A;  Provider requesting 3 hours /180 minutes for procedure   TONSILLECTOMY AND ADENOIDECTOMY     UTERINE FIBROID SURGERY      Home Medications:  Allergies as of 03/11/2023       Reactions   Celecoxib Shortness Of Breath   Pregabalin Shortness Of Breath   Buspar [buspirone] Other (See Comments)   Tachycardia   Citalopram Cough   Other Cough   [place] pear trees        Medication List        Accurate as of March 11, 2023  9:17 AM. If you have any questions, ask your nurse or doctor.          atorvastatin 20 MG tablet Commonly known as: LIPITOR TAKE 1 TABLET(20 MG) BY MOUTH DAILY   clopidogrel 75 MG tablet Commonly known as: PLAVIX TAKE 1 TABLET(75 MG) BY MOUTH DAILY   cyclobenzaprine 5 MG tablet Commonly known as: FLEXERIL Take 1 tablet (5 mg total) by mouth 3 (three) times daily as  needed for muscle spasms.   fluticasone 50 MCG/ACT nasal spray Commonly known as: FLONASE Place 2 sprays into both nostrils daily.   gabapentin 100 MG capsule Commonly known as: NEURONTIN TAKE 2 CAPSULES THREE TIMES A DAY ALONG WITH 600 MG THREE TIMES A DAY FOR A TOTAL DAILY DOSE OF 2400 MG DAILY   gabapentin 600 MG tablet Commonly known as: NEURONTIN Take 1 tablet (600 mg total) by mouth 3 (three) times daily.   Lantus SoloStar 100 UNIT/ML Solostar Pen Generic drug: insulin glargine INJECT 20 UNITS UNDER THE SKIN AT BEDTIME, TITRATE TO FASTING SUGAR OF 130-180 AS DIRECTED What changed: See the new instructions.   levothyroxine 200 MCG tablet Commonly known as: SYNTHROID Take 200 mcg by  mouth daily before breakfast.   levothyroxine 25 MCG tablet Commonly known as: SYNTHROID Take 1 tablet (25 mcg total) by mouth daily. Please take WITH your 200 mcg dose. Recommend labs in 6-8 weeks.   lisinopril 2.5 MG tablet Commonly known as: ZESTRIL Take 1 tablet (2.5 mg total) by mouth daily.   Melatonin 10 MG Tabs Take 10 mg by mouth at bedtime.   metFORMIN 500 MG tablet Commonly known as: GLUCOPHAGE Take 2 tablets (1,000 mg total) by mouth 2 (two) times daily with a meal.   nortriptyline 25 MG capsule Commonly known as: PAMELOR TAKE 2 CAPSULES BY MOUTH AT  BEDTIME   omeprazole 20 MG capsule Commonly known as: PRILOSEC TAKE 1 CAPSULE(20 MG) BY MOUTH DAILY   oxyCODONE-acetaminophen 5-325 MG tablet Commonly known as: Percocet Take 1 tablet by mouth every 8 (eight) hours. Must last 30 days.   oxyCODONE-acetaminophen 5-325 MG tablet Commonly known as: Percocet Take 1 tablet by mouth every 8 (eight) hours. Must last 30 days.   oxyCODONE-acetaminophen 5-325 MG tablet Commonly known as: Percocet Take 1 tablet by mouth every 8 (eight) hours. Must last 30 days.   traZODone 50 MG tablet Commonly known as: DESYREL TAKE 2 TABLETS(100 MG) BY MOUTH AT BEDTIME   Trulicity 3 0000000 Sopn Generic drug: Dulaglutide Inject 3 mg as directed once a week.        Allergies:  Allergies  Allergen Reactions   Celecoxib Shortness Of Breath   Pregabalin Shortness Of Breath   Buspar [Buspirone] Other (See Comments)    Tachycardia   Citalopram Cough   Other Cough    [place] pear trees    Family History: Family History  Problem Relation Age of Onset   Diabetes Father    Heart disease Father    Hypertension Father    Hyperlipidemia Father    Congestive Heart Failure Father    Healthy Brother    Osteoporosis Mother    Irritable bowel syndrome Mother    Osteoporosis Maternal Grandmother     Social History:  reports that she quit smoking about 11 years ago. Her smoking  use included cigarettes. She has never used smokeless tobacco. She reports that she does not drink alcohol and does not use drugs.  ROS: Pertinent ROS in HPI  Physical Exam: BP 101/70   Pulse 94   Ht 5\' 10"  (1.778 m)   Wt 215 lb (97.5 kg)   BMI 30.85 kg/m   Constitutional:  Well nourished. Alert and oriented, No acute distress. HEENT: [place] AT, moist mucus membranes.  Trachea midline Cardiovascular: No clubbing, cyanosis, or edema. Respiratory: Normal respiratory effort, no increased work of breathing. Neurologic: Grossly intact, no focal deficits, moving all 4 extremities. Psychiatric: Normal mood and affect.  Laboratory Data: N/A  Pertinent Imaging:  03/11/23 08:31  Scan Result 466ml    Assessment & Plan:    1. Urinary retention -resolved  -Demonstrated adequate voiding in the room by voiding 400 cc -Prescription written for drop arm bedside commode  Return in about 6 months (around 09/11/2023) for PVR and OAB questionnaire.  These notes generated with voice recognition software. I apologize for typographical errors.  [place], [place] [address]  [place], [place] [address] [phone]

## 2023-03-09 NOTE — Progress Notes (Signed)
I,[name],acting as a scribe for [name], FNP.,have documented all relevant documentation on the behalf of [name], FNP,as directed by  [name], FNP while in the presence of [name], FNP.  Established patient visit  Patient: Gabriella Marsh   DOB: July 04, 1966   57 y.o. Female  MRN: [id number] Visit Date: 03/10/2023  Today's healthcare provider: [name], FNP  Re Introduced to nurse practitioner role and practice setting.  All questions answered.  Discussed provider/patient relationship and expectations.  Chief Complaint  Patient presents with   Diabetes   abnormal mole    Has an appt 04/06/2023 with Dermatologists referred to by Tally Joe    Subjective    HPI HPI     abnormal mole    Additional comments: Has an appt 04/06/2023 with Dermatologists referred to by Tally Joe       Last edited by Babette Relic, CMA on 03/10/2023 10:27 AM.      Diabetes Mellitus Type II, Follow-up  Lab Results  Component Value Date   HGBA1C 7.7 (A) 03/10/2023   HGBA1C 8.6 (A) 09/10/2022   HGBA1C 13.1 (H) 04/24/2022   Wt Readings from Last 3 Encounters:  03/10/23 215 lb (97.5 kg)  02/10/23 215 lb (97.5 kg)  01/27/23 215 lb (97.5 kg)   Last seen for diabetes 4 months ago.  Management since then includes Statin 20 mg, Trulicity 3 mg, Gabapentin to assist with neuropathy 800 mg TID, Lantus 20 units qHS, Metformin 1000 mg BID, lisinopril 5 mg. Along with diet changes and exercise.   She reports fair compliance with treatment. She is not having side effects.  Symptoms: Yes fatigue No foot ulcerations  Yes appetite changes No nausea  No paresthesia of the feet  No polydipsia  No polyuria No visual disturbances   No vomiting     Home blood sugar records:  Patient does not check blood sugar at home.  Episodes of hypoglycemia? No   Current insulin regiment: lantus 24 units qHS Most Recent Eye Exam: Patient must schedule eye appointment has not  done it; encouraged to call for DM eye exam or come to office for sponsored DM eye exam appts.  Pertinent Labs: Lab Results  Component Value Date   CHOL 166 04/24/2022   HDL 33 (L) 04/24/2022   LDLCALC 66 04/24/2022   TRIG 430 (H) 04/24/2022   CHOLHDL 5.0 (H) 04/24/2022   Lab Results  Component Value Date   NA 136 01/09/2023   K 3.5 01/09/2023   CREATININE 0.61 01/09/2023   GFRNONAA >60 01/09/2023   LABMICR 7.4 04/24/2022   MICRALBCREAT 7 04/24/2022     ---------------------------------------------------------------------------------------------------   Medications: Outpatient Medications Prior to Visit  Medication Sig   atorvastatin (LIPITOR) 20 MG tablet TAKE 1 TABLET(20 MG) BY MOUTH DAILY   clopidogrel (PLAVIX) 75 MG tablet TAKE 1 TABLET(75 MG) BY MOUTH DAILY   cyclobenzaprine (FLEXERIL) 5 MG tablet Take 1 tablet (5 mg total) by mouth 3 (three) times daily as needed for muscle spasms.   fluticasone (FLONASE) 50 MCG/ACT nasal spray Place 2 sprays into both nostrils daily.   gabapentin (NEURONTIN) 100 MG capsule TAKE 2 CAPSULES THREE TIMES A DAY ALONG WITH 600 MG THREE TIMES A DAY FOR A TOTAL DAILY DOSE OF 2400 MG DAILY   gabapentin (NEURONTIN) 600 MG tablet Take 1 tablet (600 mg total) by mouth 3 (three) times daily.   levothyroxine (SYNTHROID) 200 MCG tablet Take 200 mcg  by mouth daily before breakfast.   levothyroxine (SYNTHROID) 25 MCG tablet Take 1 tablet (25 mcg total) by mouth daily. Please take WITH your 200 mcg dose. Recommend labs in 6-8 weeks.   Melatonin 10 MG TABS Take 10 mg by mouth at bedtime.   metFORMIN (GLUCOPHAGE) 500 MG tablet Take 2 tablets (1,000 mg total) by mouth 2 (two) times daily with a meal.   nortriptyline (PAMELOR) 25 MG capsule TAKE 2 CAPSULES BY MOUTH AT  BEDTIME   omeprazole (PRILOSEC) 20 MG capsule TAKE 1 CAPSULE(20 MG) BY MOUTH DAILY   oxyCODONE-acetaminophen (PERCOCET) 5-325 MG tablet Take 1 tablet by mouth every 8 (eight) hours. Must last 30  days.   oxyCODONE-acetaminophen (PERCOCET) 5-325 MG tablet Take 1 tablet by mouth every 8 (eight) hours. Must last 30 days.   traZODone (DESYREL) 50 MG tablet TAKE 2 TABLETS(100 MG) BY MOUTH AT BEDTIME   [DISCONTINUED] Dulaglutide (TRULICITY) 3 0000000 SOPN Inject 3 mg as directed once a week.   LANTUS SOLOSTAR 100 UNIT/ML Solostar Pen INJECT 20 UNITS UNDER THE SKIN AT BEDTIME, TITRATE TO FASTING SUGAR OF 130-180 AS DIRECTED (Patient taking differently: 24 Units at bedtime.)   oxyCODONE-acetaminophen (PERCOCET) 5-325 MG tablet Take 1 tablet by mouth every 8 (eight) hours. Must last 30 days.   [DISCONTINUED] hydrocortisone 1 % lotion Apply 1 Application topically 2 (two) times daily. (Patient not taking: Reported on 03/10/2023)   [DISCONTINUED] lisinopril (ZESTRIL) 5 MG tablet Take 5 mg by mouth daily. (Patient not taking: Reported on 03/10/2023)   No facility-administered medications prior to visit.    Review of Systems     Objective    BP 93/60 (BP Location: Right Arm, Patient Position: Sitting, Cuff Size: Normal)   Pulse 91   Temp 98.5 F (36.9 C) (Oral)   Resp 10   Ht 5\' 2"  (1.575 m)   Wt 215 lb (97.5 kg)   SpO2 96%   BMI 39.32 kg/m    Physical Exam Vitals and nursing note reviewed.  Constitutional:      General: She is not in acute distress.    Appearance: Normal appearance. She is obese. She is not ill-appearing, toxic-appearing or diaphoretic.  HENT:     Head: Normocephalic and atraumatic.  Cardiovascular:     Rate and Rhythm: Normal rate and regular rhythm.     Pulses: Normal pulses.     Heart sounds: Normal heart sounds. No murmur heard.    No friction rub. No gallop.  Pulmonary:     Effort: Pulmonary effort is normal. No respiratory distress.     Breath sounds: Normal breath sounds. No stridor. No wheezing, rhonchi or rales.  Chest:     Chest wall: No tenderness.  Musculoskeletal:        General: Tenderness present. No swelling, deformity or signs of injury.  Normal range of motion.     Right lower leg: No edema.     Left lower leg: No edema.  Skin:    General: Skin is warm and dry.     Capillary Refill: Capillary refill takes less than 2 seconds.     Coloration: Skin is not jaundiced or pale.     Findings: Lesion present. No bruising, erythema or rash.          Comments: Inflamed SQ to R back along bra line; encouraged to keep derm appt in 3 weeks  Neurological:     General: No focal deficit present.     Mental Status: She is alert  and oriented to person, place, and time.     Cranial Nerves: No cranial nerve deficit.     Sensory: Sensory deficit present.     Motor: Weakness present.     Coordination: Coordination abnormal.     Gait: Gait abnormal.  Psychiatric:        Mood and Affect: Mood normal.        Behavior: Behavior normal.        Thought Content: Thought content normal.        Judgment: Judgment normal.     Results for orders placed or performed in visit on 03/10/23  POCT glycosylated hemoglobin (Hb A1C)  Result Value Ref Range   Hemoglobin A1C 7.7 (A) 4.0 - 5.6 %   Est. average glucose Bld gHb Est-mCnc 174     Assessment & Plan     Problem List Items Addressed This Visit       Cardiovascular and Mediastinum   Hypertension associated with diabetes ([place])   Relevant Medications   Dulaglutide (TRULICITY) 3 0000000 SOPN   lisinopril (ZESTRIL) 2.5 MG tablet     Endocrine   Type 2 diabetes mellitus with diabetic neuropathy, without long-term current use of insulin ([facility]) - Primary    Chronic, improved (lowest read in 8 years) however, remains elevated above goal of 7% Continue to recommend balanced, lower carb meals. Smaller meal size, adding snacks. Choosing water as drink of choice and increasing purposeful exercise. Recommend restart of baby dose of lisinopril at 2.5 mg On lipitor at 20 mg On lantus at 24 u/night On Trulicity at 3 mg/wk On metformin 1000 mg BID       Relevant Medications   Dulaglutide  (TRULICITY) 3 0000000 SOPN   lisinopril (ZESTRIL) 2.5 MG tablet   Other Relevant Orders   POCT glycosylated hemoglobin (Hb A1C) (Completed)     Musculoskeletal and Integument   Seborrheic keratoses, inflamed    Self limiting; reassurance provided Has derm appt mid April        Other   Breast screening declined    Unable to stand at this time for breast cancer screening       Pap smear of cervix declined    Unable to transfer from w/c to table for PAP smear; continue to work with [facility] and PT      Wheelchair dependence    Chronic, iso fall, debility, and neuropathy Working with PT Has been referred by neuro for new equipment      No follow-ups on file.     Vonna Kotyk, FNP, have reviewed all documentation for this visit. The documentation on 03/10/23 for the exam, diagnosis, procedures, and orders are all accurate and complete.  [name], [place] [phone] (phone) [phone] (fax)  [place]

## 2023-03-10 ENCOUNTER — Ambulatory Visit (INDEPENDENT_AMBULATORY_CARE_PROVIDER_SITE_OTHER): Payer: BC Managed Care – PPO | Admitting: Family Medicine

## 2023-03-10 ENCOUNTER — Other Ambulatory Visit: Payer: Self-pay | Admitting: Family Medicine

## 2023-03-10 ENCOUNTER — Encounter: Payer: Self-pay | Admitting: Family Medicine

## 2023-03-10 ENCOUNTER — Telehealth: Payer: Self-pay | Admitting: Family Medicine

## 2023-03-10 VITALS — BP 93/60 | HR 91 | Temp 98.5°F | Resp 10 | Ht 62.0 in | Wt 215.0 lb

## 2023-03-10 DIAGNOSIS — E1140 Type 2 diabetes mellitus with diabetic neuropathy, unspecified: Secondary | ICD-10-CM | POA: Diagnosis not present

## 2023-03-10 DIAGNOSIS — Z5320 Procedure and treatment not carried out because of patient's decision for unspecified reasons: Secondary | ICD-10-CM

## 2023-03-10 DIAGNOSIS — E1159 Type 2 diabetes mellitus with other circulatory complications: Secondary | ICD-10-CM

## 2023-03-10 DIAGNOSIS — L820 Inflamed seborrheic keratosis: Secondary | ICD-10-CM

## 2023-03-10 DIAGNOSIS — Z993 Dependence on wheelchair: Secondary | ICD-10-CM

## 2023-03-10 DIAGNOSIS — I152 Hypertension secondary to endocrine disorders: Secondary | ICD-10-CM | POA: Insufficient documentation

## 2023-03-10 LAB — POCT GLYCOSYLATED HEMOGLOBIN (HGB A1C)
Est. average glucose Bld gHb Est-mCnc: 174
Hemoglobin A1C: 7.7 % — AB (ref 4.0–5.6)

## 2023-03-10 MED ORDER — TRULICITY 3 MG/0.5ML ~~LOC~~ SOAJ: 3.0000 mg | SUBCUTANEOUS | 3 refills | Status: DC

## 2023-03-10 MED ORDER — LISINOPRIL 2.5 MG PO TABS: 2.5000 mg | ORAL_TABLET | Freq: Every day | ORAL | 1 refills | Status: DC

## 2023-03-10 NOTE — Assessment & Plan Note (Signed)
Chronic, improved (lowest read in 8 years) however, remains elevated above goal of 7% Continue to recommend balanced, lower carb meals. Smaller meal size, adding snacks. Choosing water as drink of choice and increasing purposeful exercise. Recommend restart of baby dose of lisinopril at 2.5 mg On lipitor at 20 mg On lantus at 24 u/night On Trulicity at 3 mg/wk On metformin 1000 mg BID

## 2023-03-10 NOTE — Progress Notes (Signed)
A1c completed; improved- now 7.7% Continue all previous medications. Goal is <7%; however, this is much improved and the lowest reading in 8 years.

## 2023-03-10 NOTE — Assessment & Plan Note (Signed)
Unable to stand at this time for breast cancer screening

## 2023-03-10 NOTE — Assessment & Plan Note (Signed)
Self limiting; reassurance provided Has derm appt mid April

## 2023-03-10 NOTE — Assessment & Plan Note (Signed)
Chronic, iso fall, debility, and neuropathy Working with PT Has been referred by neuro for new equipment

## 2023-03-10 NOTE — Assessment & Plan Note (Signed)
Unable to transfer from w/c to table for PAP smear; continue to work with [facility] and PT

## 2023-03-10 NOTE — Telephone Encounter (Signed)
[facility] requesting prior authorization  Rx # [id number] Name: Gabriella Marsh SD Pen [id number]

## 2023-03-11 ENCOUNTER — Encounter: Payer: Self-pay | Admitting: Urology

## 2023-03-11 ENCOUNTER — Ambulatory Visit: Payer: BC Managed Care – PPO | Admitting: Physical Therapy

## 2023-03-11 ENCOUNTER — Encounter: Payer: Self-pay | Admitting: Physical Therapy

## 2023-03-11 ENCOUNTER — Ambulatory Visit (INDEPENDENT_AMBULATORY_CARE_PROVIDER_SITE_OTHER): Payer: BC Managed Care – PPO | Admitting: Urology

## 2023-03-11 VITALS — BP 101/70 | HR 94 | Ht 70.0 in | Wt 215.0 lb

## 2023-03-11 DIAGNOSIS — M6281 Muscle weakness (generalized): Secondary | ICD-10-CM | POA: Diagnosis not present

## 2023-03-11 DIAGNOSIS — R262 Difficulty in walking, not elsewhere classified: Secondary | ICD-10-CM

## 2023-03-11 DIAGNOSIS — R339 Retention of urine, unspecified: Secondary | ICD-10-CM | POA: Diagnosis not present

## 2023-03-11 DIAGNOSIS — R2689 Other abnormalities of gait and mobility: Secondary | ICD-10-CM | POA: Diagnosis not present

## 2023-03-11 LAB — BLADDER SCAN AMB NON-IMAGING

## 2023-03-11 NOTE — Therapy (Signed)
OUTPATIENT PHYSICAL THERAPY LOWER EXTREMITY TREATMENT    Patient Name: Gabriella Marsh MRN: [id number] DOB:10-Dec-1966, 57 y.o., female Today's Date: 03/11/2023   PT End of Session - 03/11/23 1600     Visit Number 28    Number of Visits 42    Date for PT Re-Evaluation 03/16/23    Authorization Type BCBS Other    Authorization - Visit Number 10    Authorization - Number of Visits 18    Progress Note Due on Visit 30    PT Start Time 1600    PT Stop Time 1644    PT Time Calculation (min) 44 min    Equipment Utilized During Treatment Gait belt    Activity Tolerance Patient tolerated treatment well;No increased pain;Patient limited by fatigue    Behavior During Therapy [facility] for tasks assessed/performed                Past Medical History:  Diagnosis Date   Arthritis    knees   Blood clotting disorder ([place])    on toe    Degenerative disc disease, lumbar    Diabetes mellitus without complication (HCC)    GERD (gastroesophageal reflux disease)    Headache    daily - AM (has not been able to have SPG blocks lately)   Hyperlipidemia    Hypertension    Neuropathy    feet   Vertigo    3-4x/yr   Past Surgical History:  Procedure Laterality Date   ABDOMINAL HYSTERECTOMY     But still has cervix   AMPUTATION TOE Right 07/24/2021   Procedure: AMPUTATION TOE;  Surgeon: Sharlotte Alamo, DPM;  Location: [facility] ORS;  Service: Podiatry;  Laterality: Right;   CESAREAN SECTION     CHOLECYSTECTOMY     COLONOSCOPY WITH PROPOFOL N/A 02/20/2022   Procedure: COLONOSCOPY WITH PROPOFOL;  Surgeon: [name], MD;  Location: [facility] ENDOSCOPY;  Service: Gastroenterology;  Laterality: N/A;   left arm frature  10/12/2020   OVARIAN CYST SURGERY     PARATHYROIDECTOMY  03/28/2021   Procedure: PARATHYROIDECTOMY AUTOTRANSPLANT;  Surgeon: Fredirick Maudlin, MD;  Location: [facility] ORS;  Service: General;;   right femur surgery Right    SHOULDER SURGERY Left 12/26/014   Dr. Little Ishikawa, [place] N/A 03/28/2021   Procedure: THYROIDECTOMY, total;  Surgeon: Fredirick Maudlin, MD;  Location: [facility] ORS;  Service: General;  Laterality: N/A;  Provider requesting 3 hours /180 minutes for procedure   TONSILLECTOMY AND ADENOIDECTOMY     UTERINE FIBROID SURGERY     Patient Active Problem List   Diagnosis Date Noted   Seborrheic keratoses, inflamed 03/10/2023   Breast screening declined 03/10/2023   Pap smear of cervix declined 03/10/2023   Wheelchair dependence 03/10/2023   Hypertension associated with diabetes ([place]) 03/10/2023   Sepsis secondary to UTI ([place]) 01/07/2023   AKI (acute kidney injury) ([place]) 01/07/2023   Hypokalemia 01/07/2023   Neurogenic bladder 01/07/2023   FTT (failure to thrive) in adult 12/09/2022   Asthenia 12/09/2022   Need for influenza vaccination 09/10/2022   Primary insomnia 09/10/2022   Impaired functional mobility, balance, gait, and endurance 06/09/2022   Type 2 diabetes mellitus with diabetic neuropathy, without long-term current use of insulin ([place]) 04/24/2022   Hypothyroidism 04/24/2022   Hyperlipidemia associated with type 2 diabetes mellitus ([place]) 04/24/2022   Fall 04/24/2022   Dizziness 04/24/2022   Iron deficiency anemia 04/24/2022   Colon cancer screening 01/26/2022   Chronic sensorimotor polyneuropathy with axonal and demyelinating  features 01/12/2022   Osteoarthritis of knees (Bilateral) 01/12/2022   Tricompartment osteoarthritis of knees (Bilateral) 01/12/2022    Grade 1 Retrolisthesis (89mm) of L5 over S1 01/12/2022   History of proximal humerus fracture (Left) 01/12/2022   Vitamin D deficiency 01/12/2022   Hypochloremia 01/12/2022   Chronic peripheral neuropathic pain 01/12/2022   Type 2 diabetes mellitus with peripheral neuropathy ([place]) 01/12/2022   Glands swollen 01/09/2022   Acute cough 01/09/2022   Rhinorrhea 01/09/2022   Left acute otitis media 01/09/2022   Pharmacologic therapy 11/12/2021   Disorder of skeletal system  11/12/2021   Problems influencing health status 11/12/2021   Chronic hand pain (1ry area of Pain) (Bilateral) (L>R) 11/12/2021   Chronic feet pain (3ry area of Pain) (Bilateral) (L>R) 11/12/2021   Chronic shoulder pain (4th area of Pain) (Left) 11/12/2021   Chronic knee pain (5th area of Pain) (Bilateral) (L>R) 11/12/2021   Lumbar facet syndrome (Bilateral) 11/12/2021   Muscle spasm 10/22/2021   Type 2 diabetes mellitus with microalbuminuria, without long-term current use of insulin ([place]) 10/22/2021   Diabetic foot infection ([place]) 07/22/2021   Depression with anxiety 07/22/2021   Sepsis ([place]) 07/22/2021   COVID-19 virus infection 07/22/2021   Hypocalcemia 07/17/2021   Chronic pain syndrome 07/17/2021   S/P total thyroidectomy 03/28/2021   Thyroid nodule    Schamberg's purpura 10/09/2019   Leg swelling 10/09/2019   Benign positional vertigo 02/03/2016   Common migraine with intractable migraine 06/18/2015   Anxiety 05/04/2015   Adaptation reaction 05/03/2015   DDD (degenerative disc disease), lumbosacral 05/03/2015   Insomnia due to medical condition 05/03/2015   Adiposity 05/03/2015   Disorder of peripheral nervous system 05/03/2015   Snores 05/03/2015   Diabetic peripheral neuropathy ([place]) 03/06/2015   Elevated liver function tests 03/06/2015   Obesity 03/06/2015   GERD (gastroesophageal reflux disease) 03/06/2015   Multinodular goiter 03/06/2015   RLS (restless legs syndrome) 03/06/2015   Chronic low back pain (2ry area of Pain) (Bilateral) (L>R) w/o sciatica 03/06/2015   Leg cramps 03/06/2015    PCP: [name], FNP  REFERRING PROVIDER: [name], FNP  REFERRING DIAG: [phone] (ICD-10-CM) - Fall, subsequent encounter  THERAPY DIAG:  Difficulty in walking, not elsewhere classified  Other abnormalities of gait and mobility  Muscle weakness (generalized)  Rationale for Evaluation and Treatment Rehabilitation  ONSET DATE: 06/18/22  SUBJECTIVE:    SUBJECTIVE STATEMENT:  Pt reports she is upset because she was almost dropped this morning at her urology appointment. She reports the nurse on her L side completely leg go of her despite specific instruction from her nurse and from patient. Pt still upset from experience.   PERTINENT HISTORY:  Pt reports her fall initially occurred in the evening when her husband was not home. Pt got up and had her Rolator and she walked toward her hallway. When she turned around to turn the light on she went completely down on her R leg and broke her femur distally and twisted her knee. When EMT arrived she had to straighten her LE to immobilize it. Pt went to [facility] hospital for the trauma. Upon arrival she was evaluated and had to get MRI and imaging. Progressed to 06/19/22. Pt had surgery that night. Pt had to have plate and screws in her knee and rod in her femur. Pt was in hospital for 11 days then progressed to [facility] for 30 days. Upon coming home with [facility] pt came home with catheter.  Pt  husband helps with everything doing "everything" for her except she helps with cooking. Pt considered home health physical therapy but the cost of care would be too high ($125 per visit) and this. Prior to injury pt was ambulating with rolator. Pt used bedside commode at that time. Pt reports 6 falls during this year. Pt reports 2-3 falls with rollator and 2-3 falls walking between bathroom and bedroom and 1 fall in bedroom. Pt had another fall at [place] where she broke her left humerus about 2 years ago.    PAIN:  Are you having pain? No   PRECAUTIONS: Other: WBAT   WEIGHT BEARING RESTRICTIONS     Gardenia Phlegm, MD MPH - 09/03/2022 3:15 PM EDT   Formatting of this note might be different from the original. At this time, you can continue weightbearing as tolerated to right lower extremity. Please continue working on range of motion and strengthening. We have given you prescription for  physical therapy.   FALLS:  Has patient fallen in last 6 months? Yes. Number of falls 6  LIVING ENVIRONMENT: Lives with: lives with their family and lives with their spouse Lives in: House/apartment Stairs: No Has following equipment at home: Environmental consultant - 2 wheeled, Wheelchair (manual), Shower bench, bed side commode, Grab bars, Ramped entry, and standard walker ( no wheels)  PLOF: Requires assistive device for independence, Needs assistance with ADLs, Needs assistance with homemaking, and Needs assistance with gait  PATIENT GOALS Pt would like to get legs moving a little bit more. Pt wants to be able to get to bathroom on her own.  Get upper body strength improved.      TODAY'S TREATMENT: 03/11/23   TE Seated marches AROM on the R and AROM on the left ( min) with 1# AW 2 x 10 with 3# AW  Seated LAQ AROM on the R left ( min ROM ) with 1# AW 2 x 15 with 3# AW  Seated glute press 3 x 10 R GTB and 3 x 10 L RTB Seated clamshell GTB x 20 reps  Seated hip adduction with RTB resistance 3 x 10 reps ea LE  Seated heel raise 3 x 12 reps with 3 sec hold and slow eccentric portion   Transfer with slide board to [facility] then to Nustep with Max A  Nustep with LLE hip stabilizer x 5 min level 1 and seat level 10, assistance with R LE hip stabilization and pt uses UE only when needed ( 75% using LE movement) CGA for slide board transfer to nustep   Note: Portions of this document were prepared using Dragon voice recognition software and although reviewed may contain unintentional dictation errors in syntax, grammar, or spelling.        PATIENT EDUCATION:   Education details: Pt educated throughout session about proper posture and technique with exercises. Improved exercise technique, movement at target joints, use of target muscles after min to mod verbal, visual, tactile cues.  Person educated: Patient Education method: Explanation Education comprehension: verbalized understanding   HOME  EXERCISE PROGRAM: Access Code: [email or web address] URL: [email or web address] Date: 09/28/2022 Prepared by: Rivka Barbara  Exercises - Seated March  - 1 x daily - 7 x weekly - 2 sets - 10 reps - Seated Long Arc Quad  - 1 x daily - 7 x weekly - 2 sets - 10 reps - Seated Heel Raise  - 1 x daily - 7 x weekly - 2 sets - 20 reps - Seated hip abduction  with band  - 1 x daily - 7 x weekly - 2 sets - 10 reps - Seated Hip Adduction Isometrics with Ball  - 1 x daily - 7 x weekly - 2 sets - 10 reps  ASSESSMENT:  CLINICAL IMPRESSION:  Pt demonstrates good motivation for completion of physical therapy activities.  Patient elects not to perform standing activities today secondary to near fall earlier when she was being transferred at urology clinic and nurse let go of her during transfer.  Patient reports she is sore and feeling weak from this and would be very stressed with standing.  Patient does perform lower extremity strengthening and seated as well as NuStep activities today good overall results but does show some signs of weakness compared to last time on the NuStep.  Neck session we plan to perform more standing functional activities including potentially taking a few steps in the parallel bars. Pt will continue to benefit from skilled physical therapy intervention to address impairments, improve QOL, and attain therapy goals.      OBJECTIVE IMPAIRMENTS Abnormal gait, decreased activity tolerance, decreased balance, decreased endurance, decreased mobility, difficulty walking, decreased ROM, decreased strength, impaired perceived functional ability, and impaired sensation.   ACTIVITY LIMITATIONS carrying, lifting, bending, standing, squatting, stairs, transfers, bed mobility, bathing, toileting, dressing, self feeding, hygiene/grooming, and locomotion level  PARTICIPATION LIMITATIONS: meal prep, cleaning, laundry, driving, shopping, community activity, occupation, and yard work  PERSONAL  FACTORS 3+ comorbidities: Polyneuropathy, hypertension, hyperlipidemia, diabetes mellitus, arthritis  are also affecting patient's functional outcome.   REHAB POTENTIAL: Fair secondary to comorbidities above particularly polyneuropathy affecting her lower extremity strength and muscle activation  CLINICAL DECISION MAKING: Evolving/moderate complexity  EVALUATION COMPLEXITY: Moderate   GOALS: Goals reviewed with patient? Yes  SHORT TERM GOALS: Target date: 10/26/2022     Patient will be independent in home exercise program to improve strength/mobility for better functional independence with ADLs. Baseline: No HEP currently 11/15: Completing Hep regularly.  Goal status: Met    LONG TERM GOALS: Target date: 03/16/2023    Patient will be modified independent for slide board transfers in order to improve her ability to transfer to and from wheelchair as well as bedside commode for improvement in ADLs. Baseline: Patient currently requires assistance with slide board transfers is unable to utilize bedside commode and requires use of catheter and bedpan 11/15: Pt completing slide board transfers and only requires assistance with placing slide board and maneuvering WC 1/3: Patient able to demonstrate modified independent slide board transfer from mat table to wheelchair demonstrating ability to place slide board appropriately and perform transfer with only assistance needed for chair positioning prior to transfer Goal status: MET  2.  Patient will perform sit to and from stand with min assist from physical therapist in order to indicate improved lower extremity functional strength and improved ability to perform functional transfers and functional leg activities within her home. Baseline: Patient unable to stand independently at this time due to lower extremity weakness 11/04/2022: Patient is able to perform anterior lean and pull on parallel bars and activate gluteal muscles was unable to lift  weight from wheelchair Goal status: IN PROGRESS  3.  Patient will improve focus on therapeutic outcome score to 35 or greater in order to indicate improved perceived functional ability Baseline: 19 11/15: 40 12/22/22: 43 Goal status: MET  4.  Patient will perform stand pivot transfer from wheelchair to mat table with moderate assistance or less in order to improve her ability to perform  transfers within the home to improve her overall functional capacity. Baseline: Unable to complete 11/04/2022: Patient is able to perform anterior lean and pull on parallel bars and activate gluteal muscles was unable to lift weight from wheelchair 12/22/22: Able to stand in // bars with B UE support and with 2 x Mod-Max A from PT and with PT stabilizing LE at knee.  Goal status: IN PROGRESS  4.  Pt will improve LEFS by 10 points or more in order to indicate improved LE function and mobility.  Baseline: 9 : 12/22/22: 26 Goal status: MET  5.  Patient will demonstrate ability to stand from elevated mat table with x 1 mod to max assist at rolling walker in order to show progress with stand pivot transfer with caregiver to bedside commode.  Baseline: 1/3: able to stand in // bars with x 2 mod-max A consistently Goal status: INITIAL   6.  Patient will improve LEFS score by 10 points or more in order to indicate improved lower extremity function and mobility  Baseline: 12/23/22: 26  Goal status: INITIAL    PLAN: PT FREQUENCY: 2x/week  PT DURATION: 12 weeks  PLANNED INTERVENTIONS: Therapeutic exercises, Therapeutic activity, Neuromuscular re-education, Balance training, Gait training, Patient/Family education, Self Care, Joint mobilization, Stair training, DME instructions, and Electrical stimulation  PLAN FOR NEXT SESSION:  Nustep, standing in // bars as able, LE strength, supine strengthening potentially, sit to stand transfers from elevated mat table/ plinth using RW Lite Gait discussion with patient  4:47 PM,  03/11/23 Particia Lather PT ,DPT Physical Therapist- [facility]     03/11/23, 4:47 PM

## 2023-03-12 ENCOUNTER — Encounter: Payer: Self-pay | Admitting: Family Medicine

## 2023-03-12 NOTE — Telephone Encounter (Signed)
Patient is going to send Rx card for [employer] through [place]

## 2023-03-15 ENCOUNTER — Encounter: Payer: Self-pay | Admitting: Physical Therapy

## 2023-03-15 ENCOUNTER — Ambulatory Visit: Payer: BC Managed Care – PPO | Admitting: Physical Therapy

## 2023-03-15 DIAGNOSIS — R262 Difficulty in walking, not elsewhere classified: Secondary | ICD-10-CM | POA: Diagnosis not present

## 2023-03-15 DIAGNOSIS — R2689 Other abnormalities of gait and mobility: Secondary | ICD-10-CM | POA: Diagnosis not present

## 2023-03-15 DIAGNOSIS — M6281 Muscle weakness (generalized): Secondary | ICD-10-CM | POA: Diagnosis not present

## 2023-03-15 NOTE — Therapy (Addendum)
OUTPATIENT PHYSICAL THERAPY LOWER EXTREMITY TREATMENT    Patient Name: Gabriella Marsh MRN: [id number] DOB:01/28/1966, 57 y.o., female Today's Date: 03/25/2023   PT End of Session - 03/25/23 0909     Visit Number 29    Number of Visits 42    Date for PT Re-Evaluation 03/16/23    Authorization Type [employer] Other    Authorization - Number of Visits 18    Progress Note Due on Visit 30    PT Start Time 1430    PT Stop Time 1514    PT Time Calculation (min) 44 min    Equipment Utilized During Treatment Gait belt    Activity Tolerance Patient tolerated treatment well;No increased pain;Patient limited by fatigue    Behavior During Therapy [place] for tasks assessed/performed                 Past Medical History:  Diagnosis Date   Arthritis    knees   Blood clotting disorder    on toe    Degenerative disc disease, lumbar    Diabetes mellitus without complication    GERD (gastroesophageal reflux disease)    Headache    daily - AM (has not been able to have SPG blocks lately)   Hyperlipidemia    Hypertension    Neuropathy    feet   Vertigo    3-4x/yr   Past Surgical History:  Procedure Laterality Date   ABDOMINAL HYSTERECTOMY     But still has cervix   AMPUTATION TOE Right 07/24/2021   Procedure: AMPUTATION TOE;  Surgeon: Sharlotte Alamo, DPM;  Location: [facility] ORS;  Service: Podiatry;  Laterality: Right;   CESAREAN SECTION     CHOLECYSTECTOMY     COLONOSCOPY WITH PROPOFOL N/A 02/20/2022   Procedure: COLONOSCOPY WITH PROPOFOL;  Surgeon: [name], MD;  Location: [facility] ENDOSCOPY;  Service: Gastroenterology;  Laterality: N/A;   left arm frature  10/12/2020   OVARIAN CYST SURGERY     PARATHYROIDECTOMY  03/28/2021   Procedure: PARATHYROIDECTOMY AUTOTRANSPLANT;  Surgeon: Fredirick Maudlin, MD;  Location: [facility] ORS;  Service: General;;   right femur surgery Right    SHOULDER SURGERY Left 12/26/014   Dr. Little Ishikawa, [place] N/A 03/28/2021   Procedure:  THYROIDECTOMY, total;  Surgeon: Fredirick Maudlin, MD;  Location: [facility] ORS;  Service: General;  Laterality: N/A;  Provider requesting 3 hours /180 minutes for procedure   TONSILLECTOMY AND ADENOIDECTOMY     UTERINE FIBROID SURGERY     Patient Active Problem List   Diagnosis Date Noted   Seborrheic keratoses, inflamed 03/10/2023   Breast screening declined 03/10/2023   Pap smear of cervix declined 03/10/2023   Wheelchair dependence 03/10/2023   Hypertension associated with diabetes 03/10/2023   Sepsis secondary to UTI 01/07/2023   AKI (acute kidney injury) 01/07/2023   Hypokalemia 01/07/2023   Neurogenic bladder 01/07/2023   FTT (failure to thrive) in adult 12/09/2022   Asthenia 12/09/2022   Need for influenza vaccination 09/10/2022   Primary insomnia 09/10/2022   Impaired functional mobility, balance, gait, and endurance 06/09/2022   Type 2 diabetes mellitus with diabetic neuropathy, without long-term current use of insulin 04/24/2022   Hypothyroidism 04/24/2022   Hyperlipidemia associated with type 2 diabetes mellitus 04/24/2022   Fall 04/24/2022   Dizziness 04/24/2022   Iron deficiency anemia 04/24/2022   Colon cancer screening 01/26/2022   Chronic sensorimotor polyneuropathy with axonal and demyelinating features 01/12/2022   Osteoarthritis of knees (Bilateral) 01/12/2022   Tricompartment osteoarthritis of  knees (Bilateral) 01/12/2022    Grade 1 Retrolisthesis (59mm) of L5 over S1 01/12/2022   History of proximal humerus fracture (Left) 01/12/2022   Vitamin D deficiency 01/12/2022   Hypochloremia 01/12/2022   Chronic peripheral neuropathic pain 01/12/2022   Type 2 diabetes mellitus with peripheral neuropathy 01/12/2022   Glands swollen 01/09/2022   Acute cough 01/09/2022   Rhinorrhea 01/09/2022   Left acute otitis media 01/09/2022   Pharmacologic therapy 11/12/2021   Disorder of skeletal system 11/12/2021   Problems influencing health status 11/12/2021   Chronic hand pain  (1ry area of Pain) (Bilateral) (L>R) 11/12/2021   Chronic feet pain (3ry area of Pain) (Bilateral) (L>R) 11/12/2021   Chronic shoulder pain (4th area of Pain) (Left) 11/12/2021   Chronic knee pain (5th area of Pain) (Bilateral) (L>R) 11/12/2021   Lumbar facet syndrome (Bilateral) 11/12/2021   Muscle spasm 10/22/2021   Type 2 diabetes mellitus with microalbuminuria, without long-term current use of insulin 10/22/2021   Diabetic foot infection 07/22/2021   Depression with anxiety 07/22/2021   Sepsis 07/22/2021   COVID-19 virus infection 07/22/2021   Hypocalcemia 07/17/2021   Chronic pain syndrome 07/17/2021   S/P total thyroidectomy 03/28/2021   Thyroid nodule    Schamberg's purpura 10/09/2019   Leg swelling 10/09/2019   Benign positional vertigo 02/03/2016   Common migraine with intractable migraine 06/18/2015   Anxiety 05/04/2015   Adaptation reaction 05/03/2015   DDD (degenerative disc disease), lumbosacral 05/03/2015   Insomnia due to medical condition 05/03/2015   Adiposity 05/03/2015   Disorder of peripheral nervous system 05/03/2015   Snores 05/03/2015   Diabetic peripheral neuropathy 03/06/2015   Elevated liver function tests 03/06/2015   Obesity 03/06/2015   GERD (gastroesophageal reflux disease) 03/06/2015   Multinodular goiter 03/06/2015   RLS (restless legs syndrome) 03/06/2015   Chronic low back pain (2ry area of Pain) (Bilateral) (L>R) w/o sciatica 03/06/2015   Leg cramps 03/06/2015    PCP: [name], FNP  REFERRING PROVIDER: [name], FNP  REFERRING DIAG: [phone] (ICD-10-CM) - Fall, subsequent encounter  THERAPY DIAG:  Difficulty in walking, not elsewhere classified  Other abnormalities of gait and mobility  Muscle weakness (generalized)  Rationale for Evaluation and Treatment Rehabilitation  ONSET DATE: 06/18/22  SUBJECTIVE:   SUBJECTIVE STATEMENT:  Pt reports she is doing well today with no new complaints.   PERTINENT HISTORY:  Pt  reports her fall initially occurred in the evening when her husband was not home. Pt got up and had her Rolator and she walked toward her hallway. When she turned around to turn the light on she went completely down on her R leg and broke her femur distally and twisted her knee. When EMT arrived she had to straighten her LE to immobilize it. Pt went to [facility] hospital for the trauma. Upon arrival she was evaluated and had to get MRI and imaging. Progressed to 06/19/22. Pt had surgery that night. Pt had to have plate and screws in her knee and rod in her femur. Pt was in hospital for 11 days then progressed to [facility] for 30 days. Upon coming home with [facility] pt came home with catheter.  Pt husband helps with everything doing "everything" for her except she helps with cooking. Pt considered home health physical therapy but the cost of care would be too high ($125 per visit) and this. Prior to injury pt was ambulating with rolator. Pt used bedside commode at that time. Pt reports 6  falls during this year. Pt reports 2-3 falls with rollator and 2-3 falls walking between bathroom and bedroom and 1 fall in bedroom. Pt had another fall at [place] where she broke her left humerus about 2 years ago.    PAIN:  Are you having pain? No   PRECAUTIONS: Other: WBAT   WEIGHT BEARING RESTRICTIONS     Gardenia Phlegm, MD MPH - 09/03/2022 3:15 PM EDT   Formatting of this note might be different from the original. At this time, you can continue weightbearing as tolerated to right lower extremity. Please continue working on range of motion and strengthening. We have given you prescription for physical therapy.   FALLS:  Has patient fallen in last 6 months? Yes. Number of falls 6  LIVING ENVIRONMENT: Lives with: lives with their family and lives with their spouse Lives in: House/apartment Stairs: No Has following equipment at home: Environmental consultant - 2 wheeled, Wheelchair (manual), Shower bench, bed side  commode, Grab bars, Ramped entry, and standard walker ( no wheels)  PLOF: Requires assistive device for independence, Needs assistance with ADLs, Needs assistance with homemaking, and Needs assistance with gait  PATIENT GOALS Pt would like to get legs moving a little bit more. Pt wants to be able to get to bathroom on her own.  Get upper body strength improved.      TODAY'S TREATMENT: 03/25/23   TA  Slide board transfer with mod A from PT to transition to mat table,  10 x glute press down with RTB and 1 min heel/ toe raise  Raised mat table to appropriate height ( what pt reports as height of her lift chair) for STS and brought standard walker  STS with 1 x PT assist on the left side ( max A) with 3 side steps to the left, then pt LLE "gives out" and pt comes to sitting STS with 1 x PT assist on the left side ( max A) with 2 side steps to the right, then pt LLE "gives out" and pt comes to sitting Third attempt at sit to stand patient unable to come to full erect standing and feels like her weight shifting forward proceed to below TherEX following this   Seated marches assistance to maintain neutral hip orientation with 1# AW 2 x 10 with 3# AW  Seated LAQ AROM on the R left ( min ROM ) with 1# AW 2 x 15 with 3# AW -improved in range to left lower extremity range of motion but still has assistance to maintain each additional range of motion achieved Seated hip adduction with RTB resistance 3 x 10 reps ea LE     Note: Portions of this document were prepared using Dragon voice recognition software and although reviewed may contain unintentional dictation errors in syntax, grammar, or spelling.        PATIENT EDUCATION:   Education details: Pt educated throughout session about proper posture and technique with exercises. Improved exercise technique, movement at target joints, use of target muscles after min to mod verbal, visual, tactile cues.  Person educated: Patient Education  method: Explanation Education comprehension: verbalized understanding   HOME EXERCISE PROGRAM: Access Code: [email or web address] URL: [email or web address] Date: 09/28/2022 Prepared by: Rivka Barbara  Exercises - Seated March  - 1 x daily - 7 x weekly - 2 sets - 10 reps - Seated Long Arc Quad  - 1 x daily - 7 x weekly - 2 sets - 10 reps - Seated Heel Raise  -  1 x daily - 7 x weekly - 2 sets - 20 reps - Seated hip abduction with band  - 1 x daily - 7 x weekly - 2 sets - 10 reps - Seated Hip Adduction Isometrics with Ball  - 1 x daily - 7 x weekly - 2 sets - 10 reps  ASSESSMENT:  CLINICAL IMPRESSION:  Patient presents with great motivation for completion of physical therapy activities.  Patient able to take several steps to the left into the right today although she did have issues with her left lower extremity "giving out" on her requiring her to come to seated position.  We did have mat table posterior to patient for all of these for safety with this activity.  With KAFO patient plans to receive this will help with the left knee giving out on her in the future.  Patient continues to improve progress with therapeutic exercise as well as with her therapy overall will continue to benefit from skilled physical therapy to improve her lower extremity strength. Pt will continue to benefit from skilled physical therapy intervention to address impairments, improve QOL, and attain therapy goals.     OBJECTIVE IMPAIRMENTS Abnormal gait, decreased activity tolerance, decreased balance, decreased endurance, decreased mobility, difficulty walking, decreased ROM, decreased strength, impaired perceived functional ability, and impaired sensation.   ACTIVITY LIMITATIONS carrying, lifting, bending, standing, squatting, stairs, transfers, bed mobility, bathing, toileting, dressing, self feeding, hygiene/grooming, and locomotion level  PARTICIPATION LIMITATIONS: meal prep, cleaning, laundry, driving,  shopping, community activity, occupation, and yard work  PERSONAL FACTORS 3+ comorbidities: Polyneuropathy, hypertension, hyperlipidemia, diabetes mellitus, arthritis  are also affecting patient's functional outcome.   REHAB POTENTIAL: Fair secondary to comorbidities above particularly polyneuropathy affecting her lower extremity strength and muscle activation  CLINICAL DECISION MAKING: Evolving/moderate complexity  EVALUATION COMPLEXITY: Moderate   GOALS: Goals reviewed with patient? Yes  SHORT TERM GOALS: Target date: 10/26/2022     Patient will be independent in home exercise program to improve strength/mobility for better functional independence with ADLs. Baseline: No HEP currently 11/15: Completing Hep regularly.  Goal status: Met    LONG TERM GOALS: Target date: 03/16/2023    Patient will be modified independent for slide board transfers in order to improve her ability to transfer to and from wheelchair as well as bedside commode for improvement in ADLs. Baseline: Patient currently requires assistance with slide board transfers is unable to utilize bedside commode and requires use of catheter and bedpan 11/15: Pt completing slide board transfers and only requires assistance with placing slide board and maneuvering WC 1/3: Patient able to demonstrate modified independent slide board transfer from mat table to wheelchair demonstrating ability to place slide board appropriately and perform transfer with only assistance needed for chair positioning prior to transfer Goal status: MET  2.  Patient will perform sit to and from stand with min assist from physical therapist in order to indicate improved lower extremity functional strength and improved ability to perform functional transfers and functional leg activities within her home. Baseline: Patient unable to stand independently at this time due to lower extremity weakness 11/04/2022: Patient is able to perform anterior lean and pull  on parallel bars and activate gluteal muscles was unable to lift weight from wheelchair Goal status: IN PROGRESS  3.  Patient will improve focus on therapeutic outcome score to 35 or greater in order to indicate improved perceived functional ability Baseline: 19 11/15: 40 12/22/22: 43 Goal status: MET  4.  Patient will perform stand  pivot transfer from wheelchair to mat table with moderate assistance or less in order to improve her ability to perform transfers within the home to improve her overall functional capacity. Baseline: Unable to complete 11/04/2022: Patient is able to perform anterior lean and pull on parallel bars and activate gluteal muscles was unable to lift weight from wheelchair 12/22/22: Able to stand in // bars with B UE support and with 2 x Mod-Max A from PT and with PT stabilizing LE at knee.  Goal status: IN PROGRESS  4.  Pt will improve LEFS by 10 points or more in order to indicate improved LE function and mobility.  Baseline: 9 : 12/22/22: 26 Goal status: MET  5.  Patient will demonstrate ability to stand from elevated mat table with x 1 mod to max assist at rolling walker in order to show progress with stand pivot transfer with caregiver to bedside commode.  Baseline: 1/3: able to stand in // bars with x 2 mod-max A consistently Goal status: INITIAL   6.  Patient will improve LEFS score by 10 points or more in order to indicate improved lower extremity function and mobility  Baseline: 12/23/22: 26  Goal status: INITIAL    PLAN: PT FREQUENCY: 2x/week  PT DURATION: 12 weeks  PLANNED INTERVENTIONS: Therapeutic exercises, Therapeutic activity, Neuromuscular re-education, Balance training, Gait training, Patient/Family education, Self Care, Joint mobilization, Stair training, DME instructions, and Electrical stimulation  PLAN FOR NEXT SESSION:  Nustep, standing in // bars as able, LE strength, supine strengthening potentially, sit to stand transfers from elevated mat  table/ plinth using RW Lite Gait discussion with patient  9:09 AM, 03/25/23 Particia Lather PT ,DPT Physical Therapist- [facility]     03/25/23, 9:09 AM

## 2023-03-17 NOTE — Telephone Encounter (Signed)
Called insurance an PA was created for  Request Number [phone] Outcome:Pending Will be contact via fax if any additional information is needed.

## 2023-03-18 ENCOUNTER — Encounter: Payer: BLUE CROSS/BLUE SHIELD | Admitting: Physical Therapy

## 2023-03-19 NOTE — Telephone Encounter (Signed)
Called insurance an PA was created for  Request Number [id number] Outcome:Pending Will be contact via fax if any additional information is needed. 

## 2023-03-23 ENCOUNTER — Encounter: Payer: BLUE CROSS/BLUE SHIELD | Admitting: Physical Therapy

## 2023-03-23 ENCOUNTER — Encounter: Payer: Self-pay | Admitting: Student in an Organized Health Care Education/Training Program

## 2023-03-23 ENCOUNTER — Ambulatory Visit
Payer: BC Managed Care – PPO | Attending: Student in an Organized Health Care Education/Training Program | Admitting: Student in an Organized Health Care Education/Training Program

## 2023-03-23 VITALS — BP 72/52 | HR 95 | Temp 96.8°F | Resp 16 | Ht 70.0 in | Wt 215.0 lb

## 2023-03-23 DIAGNOSIS — G608 Other hereditary and idiopathic neuropathies: Secondary | ICD-10-CM

## 2023-03-23 DIAGNOSIS — M170 Bilateral primary osteoarthritis of knee: Secondary | ICD-10-CM

## 2023-03-23 DIAGNOSIS — M792 Neuralgia and neuritis, unspecified: Secondary | ICD-10-CM | POA: Insufficient documentation

## 2023-03-23 DIAGNOSIS — G8929 Other chronic pain: Secondary | ICD-10-CM

## 2023-03-23 DIAGNOSIS — M25561 Pain in right knee: Secondary | ICD-10-CM | POA: Diagnosis not present

## 2023-03-23 DIAGNOSIS — Z7984 Long term (current) use of oral hypoglycemic drugs: Secondary | ICD-10-CM

## 2023-03-23 DIAGNOSIS — Z8781 Personal history of (healed) traumatic fracture: Secondary | ICD-10-CM | POA: Diagnosis present

## 2023-03-23 DIAGNOSIS — M545 Low back pain, unspecified: Secondary | ICD-10-CM | POA: Diagnosis not present

## 2023-03-23 DIAGNOSIS — M4310 Spondylolisthesis, site unspecified: Secondary | ICD-10-CM | POA: Diagnosis not present

## 2023-03-23 DIAGNOSIS — E1142 Type 2 diabetes mellitus with diabetic polyneuropathy: Secondary | ICD-10-CM | POA: Diagnosis not present

## 2023-03-23 DIAGNOSIS — G894 Chronic pain syndrome: Secondary | ICD-10-CM

## 2023-03-23 DIAGNOSIS — Z79891 Long term (current) use of opiate analgesic: Secondary | ICD-10-CM | POA: Diagnosis not present

## 2023-03-23 DIAGNOSIS — M25562 Pain in left knee: Secondary | ICD-10-CM | POA: Insufficient documentation

## 2023-03-23 DIAGNOSIS — Z794 Long term (current) use of insulin: Secondary | ICD-10-CM

## 2023-03-23 DIAGNOSIS — M79671 Pain in right foot: Secondary | ICD-10-CM | POA: Insufficient documentation

## 2023-03-23 DIAGNOSIS — G64 Other disorders of peripheral nervous system: Secondary | ICD-10-CM | POA: Diagnosis not present

## 2023-03-23 DIAGNOSIS — M79672 Pain in left foot: Secondary | ICD-10-CM | POA: Diagnosis not present

## 2023-03-23 DIAGNOSIS — M79642 Pain in left hand: Secondary | ICD-10-CM | POA: Insufficient documentation

## 2023-03-23 DIAGNOSIS — M79641 Pain in right hand: Secondary | ICD-10-CM

## 2023-03-23 DIAGNOSIS — M4317 Spondylolisthesis, lumbosacral region: Secondary | ICD-10-CM

## 2023-03-23 DIAGNOSIS — M25512 Pain in left shoulder: Secondary | ICD-10-CM

## 2023-03-23 DIAGNOSIS — Z79899 Other long term (current) drug therapy: Secondary | ICD-10-CM

## 2023-03-23 DIAGNOSIS — Z7985 Long-term (current) use of injectable non-insulin antidiabetic drugs: Secondary | ICD-10-CM

## 2023-03-23 MED ORDER — OXYCODONE-ACETAMINOPHEN 5-325 MG PO TABS: 1.0000 | ORAL_TABLET | Freq: Three times a day (TID) | ORAL | 0 refills | Status: DC

## 2023-03-23 NOTE — Progress Notes (Signed)
PROVIDER NOTE: Information contained herein reflects review and annotations entered in association with encounter. Interpretation of such information and data should be left to medically-trained personnel. Information provided to patient can be located elsewhere in the medical record under "Patient Instructions". Document created using STT-dictation technology, any transcriptional errors that may result from process are unintentional.    Patient: Gabriella Marsh  Service Category: E/M  Provider: Gillis Santa, MD  DOB: November 08, 1966  DOS: 03/23/2023  Referring Provider: [name], FNP  MRN: [id number]  Specialty: Interventional Pain Management  PCP: [name], FNP  Type: Established Patient  Setting: Ambulatory outpatient    Location: Office  Delivery: Face-to-face     HPI  Gabriella Marsh, a 57 y.o. year old female, is here today because of her Diabetic peripheral neuropathy [E11.42]. Gabriella Marsh primary complain today is Back Pain (Thoracic right, growth between shoulder blades, will see dermatologist April 16) and Hand Pain (Bilateral ) Last encounter: My last encounter with her was on 01/27/23 Pertinent problems: Gabriella Marsh has Diabetic peripheral neuropathy; RLS (restless legs syndrome); Chronic low back pain (2ry area of Pain) (Bilateral) (L>R) w/o sciatica; DDD (degenerative disc disease), lumbosacral; Disorder of peripheral nervous system; Chronic pain syndrome; Pharmacologic therapy; Disorder of skeletal system; Problems influencing health status; Chronic hand pain (1ry area of Pain) (Bilateral) (L>R); Chronic feet pain (3ry area of Pain) (Bilateral) (L>R); Chronic shoulder pain (4th area of Pain) (Left); Chronic knee pain (5th area of Pain) (Bilateral) (L>R); Lumbar facet syndrome (Bilateral); Chronic sensorimotor polyneuropathy with axonal and demyelinating features; Osteoarthritis of knees (Bilateral); Tricompartment osteoarthritis of knees (Bilateral);  Grade 1  Retrolisthesis (39mm) of L5 over S1; Chronic peripheral neuropathic pain; and Type 2 diabetes mellitus with peripheral neuropathy on their pertinent problem list. Pain Assessment: Severity of Chronic pain is reported as a 6 /10. Location: Back (bilateral hands) Upper, Right/to hands bilat. Onset: More than a month ago. Quality: Aching, Tingling, Discomfort, Shooting, Stabbing, Burning. Timing: Constant. Modifying factor(s): meds. Vitals:  height is 5\' 10"  (1.778 m) and weight is 215 lb (97.5 kg). Her temporal temperature is 96.8 F (36 C) (abnormal). Her blood pressure is 72/52 (abnormal) and her pulse is 95. Her respiration is 16 and oxygen saturation is 98%.   Reason for encounter: both, medication management and post-procedure evaluation and assessment.   -very low BP due to starting new medication yesterday, Lisinopril 2.5. Patient feeling light-headed up arrival to clinic. Was given water and she started to feel better.  -Plans to inform PCP today about low BP  -Patient did receive benefit with Qutenza for LE pain related to PDN, would like to try for her hands -Is doing PT right now (1x/week), hopes to continue with [facility] after her 18 sessions are up with [facility] PT      Pharmacotherapy Assessment  Analgesic: Percocet 5 mg TID as needed, quantity 90/month   Monitoring: [place] PMP: PDMP reviewed during this encounter.       Pharmacotherapy: No side-effects or adverse reactions reported. Compliance: No problems identified. Effectiveness: Clinically acceptable.  Rise Patience, RN  03/23/2023 11:12 AM  Sign when Signing Visit Nursing Pain Medication Assessment:  Safety precautions to be maintained throughout the outpatient stay will include: orient to surroundings, keep bed in low position, maintain call bell within reach at all times, provide assistance with transfer out of bed and ambulation.  Medication Inspection Compliance: Pill count conducted under aseptic conditions, in front of the  patient. Neither the pills nor the  bottle was removed from the patient's sight at any time. Once count was completed pills were immediately returned to the patient in their original bottle.  Medication: Oxycodone/APAP Pill/Patch Count:  52 of 90 pills remain Pill/Patch Appearance: Markings consistent with prescribed medication Bottle Appearance: Standard pharmacy container. Clearly labeled. Filled Date: 03 / 22 / 2024 Last Medication intake:  Today    UDS:  Summary  Date Value Ref Range Status  11/12/2021 Note  Final    Comment:    ==================================================================== Compliance Drug Analysis, Ur ==================================================================== Test                             Result       Flag       Units  Drug Present and Declared for Prescription Verification   Oxycodone                      1136         EXPECTED   ng/mg creat   Oxymorphone                    1753         EXPECTED   ng/mg creat   Noroxycodone                   1215         EXPECTED   ng/mg creat   Noroxymorphone                 779          EXPECTED   ng/mg creat    Sources of oxycodone are scheduled prescription medications.    Oxymorphone, noroxycodone, and noroxymorphone are expected    metabolites of oxycodone. Oxymorphone is also available as a    scheduled prescription medication.    Gabapentin                     PRESENT      EXPECTED   Cyclobenzaprine                PRESENT      EXPECTED   Desmethylcyclobenzaprine       PRESENT      EXPECTED    Desmethylcyclobenzaprine is an expected metabolite of    cyclobenzaprine.    Nortriptyline                  PRESENT      EXPECTED    Nortriptyline may be administered as a prescription drug; it is also    an expected metabolite of amitriptyline.    Trazodone                      PRESENT      EXPECTED   1,3 chlorophenyl piperazine    PRESENT      EXPECTED    1,3-chlorophenyl piperazine is an expected  metabolite of trazodone.    Acetaminophen                  PRESENT      EXPECTED ==================================================================== Test                      Result    Flag   Units      Ref Range   Creatinine  165              mg/dL      >=20 ==================================================================== Declared Medications:  The flagging and interpretation on this report are based on the  following declared medications.  Unexpected results may arise from  inaccuracies in the declared medications.   **Note: The testing scope of this panel includes these medications:   Cyclobenzaprine (Flexeril)  Gabapentin (Neurontin)  Nortriptyline (Pamelor)  Oxycodone (Percocet)  Trazodone (Desyrel)   **Note: The testing scope of this panel does not include small to  moderate amounts of these reported medications:   Acetaminophen (Percocet)   **Note: The testing scope of this panel does not include the  following reported medications:   Atorvastatin  Clopidogrel  Dulaglutide (Trulicity)  Glipizide (Glucotrol)  Hydrochlorothiazide  Levothyroxine  Lisinopril (Zestril)  Melatonin  Metformin (Glucophage)  Omeprazole  Triamterene ==================================================================== For clinical consultation, please call [phone]. ====================================================================      ROS  Constitutional: Denies any fever or chills Gastrointestinal: No reported hemesis, hematochezia, vomiting, or acute GI distress Musculoskeletal: bilateral hand pand pain Neurological:  Bilateral feet paresthesias  Medication Review  Dulaglutide, Melatonin, atorvastatin, clopidogrel, cyclobenzaprine, fluticasone, gabapentin, insulin glargine, levothyroxine, lisinopril, metFORMIN, nortriptyline, omeprazole, oxyCODONE-acetaminophen, and traZODone  History Review  Allergy: Gabriella Marsh is allergic to celecoxib, pregabalin,  buspar [buspirone], citalopram, and other. Drug: Gabriella Marsh  reports no history of drug use. Alcohol:  reports no history of alcohol use. Tobacco:  reports that she quit smoking about 11 years ago. Her smoking use included cigarettes. She has never used smokeless tobacco. Social: Gabriella Marsh  reports that she quit smoking about 11 years ago. Her smoking use included cigarettes. She has never used smokeless tobacco. She reports that she does not drink alcohol and does not use drugs. Medical:  has a past medical history of Arthritis, Blood clotting disorder, Degenerative disc disease, lumbar, Diabetes mellitus without complication, GERD (gastroesophageal reflux disease), Headache, Hyperlipidemia, Hypertension, Neuropathy, and Vertigo. Surgical: Gabriella Marsh  has a past surgical history that includes Cesarean section; Shoulder surgery (Left, 12/26/014); Abdominal hysterectomy; Tonsillectomy and adenoidectomy; Uterine fibroid surgery; Ovarian cyst surgery; Cholecystectomy; left arm frature (10/12/2020); Thyroidectomy (N/A, 03/28/2021); Parathyroidectomy (03/28/2021); Amputation toe (Right, 07/24/2021); Colonoscopy with propofol (N/A, 02/20/2022); and right femur surgery (Right). Family: family history includes Congestive Heart Failure in her father; Diabetes in her father; Healthy in her brother; Heart disease in her father; Hyperlipidemia in her father; Hypertension in her father; Irritable bowel syndrome in her mother; Osteoporosis in her maternal grandmother and mother.  Laboratory Chemistry Profile   Renal Lab Results  Component Value Date   BUN 7 01/09/2023   CREATININE 0.61 01/09/2023   BCR 21 04/24/2022   GFR 111.18 06/05/2015   GFRAA 92 01/03/2021   GFRNONAA >60 01/09/2023    Hepatic Lab Results  Component Value Date   AST 57 (H) 01/07/2023   ALT 76 (H) 01/07/2023   ALBUMIN 4.0 01/07/2023   ALKPHOS 55 01/07/2023   LIPASE 47 05/04/2015    Electrolytes Lab Results  Component Value  Date   NA 136 01/09/2023   K 3.5 01/09/2023   CL 99 01/09/2023   CALCIUM 8.3 (L) 01/09/2023   MG 1.8 01/09/2023    Bone Lab Results  Component Value Date   25OHVITD1 14 (L) 11/12/2021   25OHVITD2 <1.0 11/12/2021   25OHVITD3 13 11/12/2021    Inflammation (CRP: Acute Phase) (ESR: Chronic Phase) Lab Results  Component Value Date   CRP 4 11/12/2021   ESRSEDRATE  39 11/12/2021   LATICACIDVEN 2.9 ([place]) 01/07/2023         Note: Above Lab results reviewed.  Recent Imaging Review  US Venous Img Lower Bilateral CLINICAL DATA:  pain  EXAM: BILATERAL LOWER EXTREMITY VENOUS DOPPLER ULTRASOUND  TECHNIQUE: Gray-scale sonography with graded compression, as well as color Doppler and duplex ultrasound were performed to evaluate the lower extremity deep venous systems from the level of the common femoral vein and including the common femoral, femoral, profunda femoral, popliteal and calf veins including the posterior tibial, peroneal and gastrocnemius veins when visible. The superficial great saphenous vein was also interrogated. Spectral Doppler was utilized to evaluate flow at rest and with distal augmentation maneuvers in the common femoral, femoral and popliteal veins.  COMPARISON:  LEFT lower extremity venous duplex, 05/27/2022.  FINDINGS: RIGHT LOWER EXTREMITY  VENOUS  Normal compressibility of the RIGHT common femoral, superficial femoral, and popliteal veins, as well as the visualized calf veins. Visualized portions of profunda femoral vein and great saphenous vein unremarkable. No filling defects to suggest DVT on grayscale or color Doppler imaging. Doppler waveforms show normal direction of venous flow, normal respiratory plasticity and response to augmentation.  OTHER  No evidence of superficial thrombophlebitis or abnormal fluid collection.  Limitations: Patient body habitus  LEFT LOWER EXTREMITY  VENOUS  Normal compressibility of the LEFT common femoral,  superficial femoral, and popliteal veins, as well as the visualized calf veins. Visualized portions of profunda femoral vein and great saphenous vein unremarkable. No filling defects to suggest DVT on grayscale or color Doppler imaging. Doppler waveforms show normal direction of venous flow, normal respiratory plasticity and response to augmentation.  OTHER  No evidence of superficial thrombophlebitis or abnormal fluid collection.  Limitations: Patient body habitus  IMPRESSION: No evidence of femoropopliteal DVT or superficial thrombophlebitis within either lower extremity.  Michaelle Birks, MD  [facility]  [facility]  Electronically Signed   By: Michaelle Birks M.D.   On: 01/07/2023 12:23 DG Chest Port 1 View CLINICAL DATA:  Hypotension.  EXAM: PORTABLE CHEST 1 VIEW  COMPARISON:  07/22/2021  FINDINGS: The lungs are clear without focal pneumonia, edema, pneumothorax or pleural effusion. The cardiopericardial silhouette is within normal limits for size. The visualized bony structures of the thorax are unremarkable. Telemetry leads overlie the chest.  IMPRESSION: No active disease.  Electronically Signed   By: Misty Stanley M.D.   On: 01/07/2023 11:21 Note: Reviewed        Physical Exam  General appearance: Well nourished, well developed, and well hydrated. In no apparent acute distress Mental status: Alert, oriented x 3 (person, place, & time)       Respiratory: No evidence of acute respiratory distress Eyes: PERLA Vitals: BP (!) 72/52   Pulse 95   Temp (!) 96.8 F (36 C) (Temporal)   Resp 16   Ht 5\' 10"  (1.778 m)   Wt 215 lb (97.5 kg)   SpO2 98%   BMI 30.85 kg/m  BMI: Estimated body mass index is 30.85 kg/m as calculated from the following:   Height as of this encounter: 5\' 10"  (1.778 m).   Weight as of this encounter: 215 lb (97.5 kg). Ideal: Ideal body weight: 68.5 kg (151 lb 0.2 oz) Adjusted ideal body  weight: 80.1 kg (176 lb 9.7 oz)  Patient presents in wheelchair.   Bilateral hand and feet paresthesias   Assessment   Diagnosis Status  1. Diabetic peripheral neuropathy   2. Chronic peripheral  neuropathic pain   3. Chronic sensorimotor polyneuropathy with axonal and demyelinating features   4. History of proximal humerus fracture (Left)   5. Type 2 diabetes mellitus with peripheral neuropathy   6. Osteoarthritis of knees (Bilateral)   7. Disorder of peripheral nervous system   8. Chronic pain syndrome   9. Pharmacologic therapy   10. Chronic shoulder pain (4th area of Pain) (Left)   11.  Grade 1 Retrolisthesis (31mm) of L5 over S1   12. Chronic hand pain (1ry area of Pain) (Bilateral) (L>R)   13. Encounter for medication management   14. Encounter for chronic pain management   15. Chronic use of opiate for therapeutic purpose   16. Chronic knee pain (5th area of Pain) (Bilateral) (L>R)   17. Tricompartment osteoarthritis of knees (Bilateral)   18. Chronic low back pain (2ry area of Pain) (Bilateral) (L>R) w/o sciatica   19. Chronic feet pain (3ry area of Pain) (Bilateral) (L>R)      Worsening Worsening Persistent    Plan of Care    Ms. Jatavia Marsh has a current medication list which includes the following long-term medication(s): atorvastatin, fluticasone, gabapentin, gabapentin, lantus solostar, levothyroxine, lisinopril, metformin, nortriptyline, omeprazole, trazodone, levothyroxine, [START ON 04/14/2023] oxycodone-acetaminophen, [START ON 05/14/2023] oxycodone-acetaminophen, and [START ON 06/13/2023] oxycodone-acetaminophen.  Pharmacotherapy (Medications Ordered): Meds ordered this encounter  Medications   oxyCODONE-acetaminophen (PERCOCET) 5-325 MG tablet    Sig: Take 1 tablet by mouth every 8 (eight) hours. Must last 30 days.    Dispense:  90 tablet    Refill:  0    DO NOT: delete (not duplicate); no partial-fill (will deny script to complete), no refill  request (F/U required). DISPENSE: 1 day early if closed on fill date. WARN: No CNS-depressants within 8 hrs of med.   oxyCODONE-acetaminophen (PERCOCET) 5-325 MG tablet    Sig: Take 1 tablet by mouth every 8 (eight) hours. Must last 30 days.    Dispense:  90 tablet    Refill:  0    DO NOT: delete (not duplicate); no partial-fill (will deny script to complete), no refill request (F/U required). DISPENSE: 1 day early if closed on fill date. WARN: No CNS-depressants within 8 hrs of med.   oxyCODONE-acetaminophen (PERCOCET) 5-325 MG tablet    Sig: Take 1 tablet by mouth every 8 (eight) hours. Must last 30 days.    Dispense:  90 tablet    Refill:  0    DO NOT: delete (not duplicate); no partial-fill (will deny script to complete), no refill request (F/U required). DISPENSE: 1 day early if closed on fill date. WARN: No CNS-depressants within 8 hrs of med.   Orders Placed This Encounter  Procedures   NEUROLYSIS    Please order Qutenza patches from pharmacy    Standing Status:   Future    Standing Expiration Date:   06/22/2023    Order Specific Question:   Where will this procedure be performed?    Answer:   [facility]     Continue multimodal analgesics as prescribed Continue with PT   Follow-up plan:   No follow-ups on file.    Recent Visits Date Type Provider Dept  01/27/23 Procedure visit Gillis Santa, MD [facility]  12/31/22 Office Visit Gillis Santa, MD [facility]  Showing recent visits within past 90 days and meeting all other requirements Today's Visits Date Type Provider Dept  03/23/23 Office Visit Gillis Santa, MD [facility]  Showing today's visits  and meeting all other requirements Future Appointments No visits were found meeting these conditions. Showing future appointments within next 90 days and meeting all other requirements  I discussed the assessment and treatment plan with the patient. The patient was provided an  opportunity to ask questions and all were answered. The patient agreed with the plan and demonstrated an understanding of the instructions.  Patient advised to call back or seek an in-person evaluation if the symptoms or condition worsens.  Duration of encounter: 59minutes.  Total time on encounter, as per AMA guidelines included both the face-to-face and non-face-to-face time personally spent by the physician and/or other qualified health care professional(s) on the day of the encounter (includes time in activities that require the physician or other qualified health care professional and does not include time in activities normally performed by clinical staff). Physician's time may include the following activities when performed: preparing to see the patient (eg, review of tests, pre-charting review of records) obtaining and/or reviewing separately obtained history performing a medically appropriate examination and/or evaluation counseling and educating the patient/family/caregiver ordering medications, tests, or procedures referring and communicating with other health care professionals (when not separately reported) documenting clinical information in the electronic or other health record independently interpreting results (not separately reported) and communicating results to the patient/ family/caregiver care coordination (not separately reported)  Note by: Gillis Santa, MD Date: 03/23/2023; Time: 11:17 AM

## 2023-03-23 NOTE — Progress Notes (Signed)
Nursing Pain Medication Assessment:  Safety precautions to be maintained throughout the outpatient stay will include: orient to surroundings, keep bed in low position, maintain call bell within reach at all times, provide assistance with transfer out of bed and ambulation.  Medication Inspection Compliance: Pill count conducted under aseptic conditions, in front of the patient. Neither the pills nor the bottle was removed from the patient's sight at any time. Once count was completed pills were immediately returned to the patient in their original bottle.  Medication: Oxycodone/APAP Pill/Patch Count:  52 of 90 pills remain Pill/Patch Appearance: Markings consistent with prescribed medication Bottle Appearance: Standard pharmacy container. Clearly labeled. Filled Date: 03 / 22 / 2024 Last Medication intake:  Today

## 2023-03-25 ENCOUNTER — Encounter: Payer: BC Managed Care – PPO | Admitting: Student in an Organized Health Care Education/Training Program

## 2023-03-25 ENCOUNTER — Encounter: Payer: Self-pay | Admitting: Physical Therapy

## 2023-03-25 ENCOUNTER — Ambulatory Visit: Payer: BC Managed Care – PPO | Attending: Family Medicine | Admitting: Physical Therapy

## 2023-03-25 DIAGNOSIS — R2689 Other abnormalities of gait and mobility: Secondary | ICD-10-CM | POA: Diagnosis not present

## 2023-03-25 DIAGNOSIS — M6281 Muscle weakness (generalized): Secondary | ICD-10-CM | POA: Insufficient documentation

## 2023-03-25 DIAGNOSIS — R262 Difficulty in walking, not elsewhere classified: Secondary | ICD-10-CM | POA: Diagnosis not present

## 2023-03-25 NOTE — Therapy (Signed)
OUTPATIENT PHYSICAL THERAPY LOWER EXTREMITY TREATMENT /Physical Therapy Progress Note/ RECERT    Dates of reporting period  12/28/22   to   03/25/23     Patient Name: Gabriella Marsh MRN: [id number] DOB:08-14-1966, 57 y.o., female Today's Date: 03/25/2023   PT End of Session - 03/25/23 1559     Visit Number 30    Number of Visits 42    Date for PT Re-Evaluation 06/17/23    Authorization Type BC[employer] Other    Authorization - Number of Visits 18    Progress Note Due on Visit 30    PT Start Time 1602    PT Stop Time 1644    PT Time Calculation (min) 42 min    Equipment Utilized During Treatment Gait belt    Activity Tolerance Patient tolerated treatment well;No increased pain;Patient limited by fatigue    Behavior During Therapy [facility] for tasks assessed/performed                Past Medical History:  Diagnosis Date   Arthritis    knees   Blood clotting disorder    on toe    Degenerative disc disease, lumbar    Diabetes mellitus without complication    GERD (gastroesophageal reflux disease)    Headache    daily - AM (has not been able to have SPG blocks lately)   Hyperlipidemia    Hypertension    Neuropathy    feet   Vertigo    3-4x/yr   Past Surgical History:  Procedure Laterality Date   ABDOMINAL HYSTERECTOMY     But still has cervix   AMPUTATION TOE Right 07/24/2021   Procedure: AMPUTATION TOE;  Surgeon: Sharlotte Alamo, DPM;  Location: [facility] ORS;  Service: Podiatry;  Laterality: Right;   CESAREAN SECTION     CHOLECYSTECTOMY     COLONOSCOPY WITH PROPOFOL N/A 02/20/2022   Procedure: COLONOSCOPY WITH PROPOFOL;  Surgeon: [name], MD;  Location: [facility] ENDOSCOPY;  Service: Gastroenterology;  Laterality: N/A;   left arm frature  10/12/2020   OVARIAN CYST SURGERY     PARATHYROIDECTOMY  03/28/2021   Procedure: PARATHYROIDECTOMY AUTOTRANSPLANT;  Surgeon: Fredirick Maudlin, MD;  Location: [facility] ORS;  Service: General;;   right femur surgery Right    SHOULDER SURGERY  Left 12/26/014   Dr. Little Ishikawa, [place] N/A 03/28/2021   Procedure: THYROIDECTOMY, total;  Surgeon: Fredirick Maudlin, MD;  Location: [facility] ORS;  Service: General;  Laterality: N/A;  Provider requesting 3 hours /180 minutes for procedure   TONSILLECTOMY AND ADENOIDECTOMY     UTERINE FIBROID SURGERY     Patient Active Problem List   Diagnosis Date Noted   Seborrheic keratoses, inflamed 03/10/2023   Breast screening declined 03/10/2023   Pap smear of cervix declined 03/10/2023   Wheelchair dependence 03/10/2023   Hypertension associated with diabetes 03/10/2023   Sepsis secondary to UTI 01/07/2023   AKI (acute kidney injury) 01/07/2023   Hypokalemia 01/07/2023   Neurogenic bladder 01/07/2023   FTT (failure to thrive) in adult 12/09/2022   Asthenia 12/09/2022   Need for influenza vaccination 09/10/2022   Primary insomnia 09/10/2022   Impaired functional mobility, balance, gait, and endurance 06/09/2022   Type 2 diabetes mellitus with diabetic neuropathy, without long-term current use of insulin 04/24/2022   Hypothyroidism 04/24/2022   Hyperlipidemia associated with type 2 diabetes mellitus 04/24/2022   Fall 04/24/2022   Dizziness 04/24/2022   Iron deficiency anemia 04/24/2022   Colon cancer screening 01/26/2022   Chronic  sensorimotor polyneuropathy with axonal and demyelinating features 01/12/2022   Osteoarthritis of knees (Bilateral) 01/12/2022   Tricompartment osteoarthritis of knees (Bilateral) 01/12/2022    Grade 1 Retrolisthesis (59mm) of L5 over S1 01/12/2022   History of proximal humerus fracture (Left) 01/12/2022   Vitamin D deficiency 01/12/2022   Hypochloremia 01/12/2022   Chronic peripheral neuropathic pain 01/12/2022   Type 2 diabetes mellitus with peripheral neuropathy 01/12/2022   Glands swollen 01/09/2022   Acute cough 01/09/2022   Rhinorrhea 01/09/2022   Left acute otitis media 01/09/2022   Pharmacologic therapy 11/12/2021   Disorder of skeletal  system 11/12/2021   Problems influencing health status 11/12/2021   Chronic hand pain (1ry area of Pain) (Bilateral) (L>R) 11/12/2021   Chronic feet pain (3ry area of Pain) (Bilateral) (L>R) 11/12/2021   Chronic shoulder pain (4th area of Pain) (Left) 11/12/2021   Chronic knee pain (5th area of Pain) (Bilateral) (L>R) 11/12/2021   Lumbar facet syndrome (Bilateral) 11/12/2021   Muscle spasm 10/22/2021   Type 2 diabetes mellitus with microalbuminuria, without long-term current use of insulin 10/22/2021   Diabetic foot infection 07/22/2021   Depression with anxiety 07/22/2021   Sepsis 07/22/2021   COVID-19 virus infection 07/22/2021   Hypocalcemia 07/17/2021   Chronic pain syndrome 07/17/2021   S/P total thyroidectomy 03/28/2021   Thyroid nodule    Schamberg's purpura 10/09/2019   Leg swelling 10/09/2019   Benign positional vertigo 02/03/2016   Common migraine with intractable migraine 06/18/2015   Anxiety 05/04/2015   Adaptation reaction 05/03/2015   DDD (degenerative disc disease), lumbosacral 05/03/2015   Insomnia due to medical condition 05/03/2015   Adiposity 05/03/2015   Disorder of peripheral nervous system 05/03/2015   Snores 05/03/2015   Diabetic peripheral neuropathy 03/06/2015   Elevated liver function tests 03/06/2015   Obesity 03/06/2015   GERD (gastroesophageal reflux disease) 03/06/2015   Multinodular goiter 03/06/2015   RLS (restless legs syndrome) 03/06/2015   Chronic low back pain (2ry area of Pain) (Bilateral) (L>R) w/o sciatica 03/06/2015   Leg cramps 03/06/2015    PCP: [name], FNP  REFERRING PROVIDER: [name], FNP  REFERRING DIAG: [phone] (ICD-10-CM) - Fall, subsequent encounter  THERAPY DIAG:  Difficulty in walking, not elsewhere classified  Other abnormalities of gait and mobility  Muscle weakness (generalized)  Rationale for Evaluation and Treatment Rehabilitation  ONSET DATE: 06/18/22  SUBJECTIVE:   SUBJECTIVE  STATEMENT:  Patient reports improved ability to perform slide board transfers on her own.  Patient reports her form to slide board transfers from wheelchair to car over the last couple days.  Patient was able to stand with 2 times assist from son and husband since last visit and was able to maintain standing for about 30 seconds with upper extremity support on walker.  Patient has been continue to work diligently on home exercise program and have noticed improvements in her strength with several functional activities as listed above.  PERTINENT HISTORY:  Pt reports her fall initially occurred in the evening when her husband was not home. Pt got up and had her Rolator and she walked toward her hallway. When she turned around to turn the light on she went completely down on her R leg and broke her femur distally and twisted her knee. When EMT arrived she had to straighten her LE to immobilize it. Pt went to [facility] hospital for the trauma. Upon arrival she was evaluated and had to get MRI and imaging. Progressed to 06/19/22. Pt had surgery that  night. Pt had to have plate and screws in her knee and rod in her femur. Pt was in hospital for 11 days then progressed to [facility] for 30 days. Upon coming home with [facility] pt came home with catheter.  Pt husband helps with everything doing "everything" for her except she helps with cooking. Pt considered home health physical therapy but the cost of care would be too high ( $125 per visit) and this. Prior to injury pt was ambulating with rolator. Pt used bedside commode at that time. Pt reports 6 falls during this year. Pt reports 2-3 falls with rollator and 2-3 falls walking between bathroom and bedroom and 1 fall in bedroom. Pt had another fall at [place] where she broke her left humerus about 2 years ago.    PAIN:  Are you having pain? No   PRECAUTIONS: Other: WBAT   WEIGHT BEARING RESTRICTIONS     Gardenia Phlegm, MD MPH - 09/03/2022 3:15  PM EDT   Formatting of this note might be different from the original. At this time, you can continue weightbearing as tolerated to right lower extremity. Please continue working on range of motion and strengthening. We have given you prescription for physical therapy.   FALLS:  Has patient fallen in last 6 months? Yes. Number of falls 6  LIVING ENVIRONMENT: Lives with: lives with their family and lives with their spouse Lives in: House/apartment Stairs: No Has following equipment at home: Environmental consultant - 2 wheeled, Wheelchair (manual), Shower bench, bed side commode, Grab bars, Ramped entry, and standard walker ( no wheels)  PLOF: Requires assistive device for independence, Needs assistance with ADLs, Needs assistance with homemaking, and Needs assistance with gait  PATIENT GOALS Pt would like to get legs moving a little bit more. Pt wants to be able to get to bathroom on her own.  Get upper body strength improved.      TODAY'S TREATMENT: 03/25/23 TA  Slideboard transfer from [facility] to adjustable mat table, raised mat table to height where feet are able to be flat on floor but barely Performed sit to stand transition with to physical therapist assistance   For goal assessment patient reports on sit to stand transfers from raised height mat table.  Patient able to perform on first repetition with min to mod assist x 2 and able to take 3 marches on each lower extremity prior to seated rest break.  Following this patient completes this another 2 rounds with seated rest breaks between.  Patient utilizes upper extremities on walker for stability and in standing patient only requires contact-guard assist to maintain balance.  Set up wheelchair for attempt at stand pivot transfer.  Physical therapist and patient attempt several tries to perform sit to stand transfer including mod to max assist x 2 and patient unable to come to standing again this date.  Likely due to significant fatigue from a following  standing interventions and testing.  Will consider attempting this at next session.  Transfer with slide board from mat table to wheelchair patient completes with standby assist with some set up assistance for positioning of wheelchair and slide board.  Patient a little disappointed and sad that she was unable to perform the stand pivot transfer but was encouraged that she does show progress with many other interventions including her ability to stand with increased independence although still requiring min assist x 2 to transition to standing and raised height of mat table.  Note: Portions  of this document were prepared using Dragon voice recognition software and although reviewed may contain unintentional dictation errors in syntax, grammar, or spelling.        PATIENT EDUCATION:  Education details: Pt educated throughout session about proper posture and technique with exercises. Improved exercise technique, movement at target joints, use of target muscles after min to mod verbal, visual, tactile cues.  Person educated: Patient Education method: Explanation Education comprehension: verbalized understanding   HOME EXERCISE PROGRAM: Access Code: [email or web address] URL: [email or web address] Date: 09/28/2022 Prepared by: Rivka Barbara  Exercises - Seated March  - 1 x daily - 7 x weekly - 2 sets - 10 reps - Seated Long Arc Quad  - 1 x daily - 7 x weekly - 2 sets - 10 reps - Seated Heel Raise  - 1 x daily - 7 x weekly - 2 sets - 20 reps - Seated hip abduction with band  - 1 x daily - 7 x weekly - 2 sets - 10 reps - Seated Hip Adduction Isometrics with Ball  - 1 x daily - 7 x weekly - 2 sets - 10 reps  ASSESSMENT:  CLINICAL IMPRESSION: Patient presents to physical therapy for recert note as well as progress note this date.  Patient has made great progress with physical therapy thus far showing improved independence with several activities including transfers and has progressed  to not having to utilize a catheter for her urination needs.  Patient slide board transfer has improved significantly where she only needs set up assistance at this time and stand by assistance for safety.  Patient has progressed with her ability to transition from sit to stand from an elevated surface as well as ability to take several marching steps and standing all while holding onto standard walker for stability and with contact-guard assist x 2 when patient is in standing position.  Patient also makes progress on lower extremity functional scale showing improved independence with several activities but is still working toward this goal. Patient's condition has the potential to improve in response to therapy. Maximum improvement is yet to be obtained. The anticipated improvement is attainable and reasonable in a generally predictable time.  Pt will continue to benefit from skilled physical therapy intervention to address impairments, improve QOL, and attain therapy goals.      OBJECTIVE IMPAIRMENTS Abnormal gait, decreased activity tolerance, decreased balance, decreased endurance, decreased mobility, difficulty walking, decreased ROM, decreased strength, impaired perceived functional ability, and impaired sensation.   ACTIVITY LIMITATIONS carrying, lifting, bending, standing, squatting, stairs, transfers, bed mobility, bathing, toileting, dressing, self feeding, hygiene/grooming, and locomotion level  PARTICIPATION LIMITATIONS: meal prep, cleaning, laundry, driving, shopping, community activity, occupation, and yard work  PERSONAL FACTORS 3+ comorbidities: Polyneuropathy, hypertension, hyperlipidemia, diabetes mellitus, arthritis  are also affecting patient's functional outcome.   REHAB POTENTIAL: Fair secondary to comorbidities above particularly polyneuropathy affecting her lower extremity strength and muscle activation  CLINICAL DECISION MAKING: Evolving/moderate complexity  EVALUATION  COMPLEXITY: Moderate   GOALS: Goals reviewed with patient? Yes  SHORT TERM GOALS: Target date: 10/26/2022     Patient will be independent in home exercise program to improve strength/mobility for better functional independence with ADLs. Baseline: No HEP currently 11/15: Completing Hep regularly.  Goal status: Met    LONG TERM GOALS: Target date: 06/17/2023      Patient will be modified independent for slide board transfers in order to improve her ability to transfer to and from wheelchair as well as bedside commode for improvement in  ADLs. Baseline: Patient currently requires assistance with slide board transfers is unable to utilize bedside commode and requires use of catheter and bedpan 11/15: Pt completing slide board transfers and only requires assistance with placing slide board and maneuvering WC 1/3: Patient able to demonstrate modified independent slide board transfer from mat table to wheelchair demonstrating ability to place slide board appropriately and perform transfer with only assistance needed for chair positioning prior to transfer Goal status: MET  2.  Patient will perform sit to and from stand with min assist from physical therapist in order to indicate improved lower extremity functional strength and improved ability to perform functional transfers and functional leg activities within her home. Baseline: Patient unable to stand independently at this time due to lower extremity weakness 11/04/2022: Patient is able to perform anterior lean and pull on parallel bars and activate gluteal muscles was unable to lift weight from wheelchair 4/4: Pt able to stand with 2 x Min A from PT and from caregiver using standard walker  Goal status: IN PROGRESS  3.  Patient will improve focus on therapeutic outcome score to 35 or greater in order to indicate improved perceived functional ability Baseline: 19 11/15: 40 12/22/22: 43 Goal status: MET  4.  Patient will perform stand pivot  transfer from wheelchair to mat table with moderate assistance or less in order to improve her ability to perform transfers within the home to improve her overall functional capacity. Baseline: Unable to complete 11/04/2022: Patient is able to perform anterior lean and pull on parallel bars and activate gluteal muscles was unable to lift weight from wheelchair 12/22/22: Able to stand in // bars with B UE support and with 2 x Mod-Max A from PT and with PT stabilizing LE at knee.  03/25/23: Unable to attend at this time secondary to fatigue and lower extremities and unable to get to standing position following 3 stands earlier in session.  Continue to practice in future sessions with patient is less fatigued Goal status: IN PROGRESS  4.  Pt will improve LEFS by 10 points or more in order to indicate improved LE function and mobility.  Baseline: 9 : 12/22/22: 26 Goal status: MET  5.  Patient will demonstrate ability to stand from elevated mat table with x 1 mod to max assist at rolling walker in order to show progress with stand pivot transfer with caregiver to bedside commode.  Baseline: 1/3: able to stand in // bars with x 2 mod-max A consistently 03/25/23: Able to stand with 2 times min assist on 2 attempts this session but still is unable to consistently stand with x 1 mod to max assist with standard Goal status: IN PROGRESS   6.  Patient will improve LEFS score by 10 points or more in order to indicate improved lower extremity function and mobility  Baseline: 12/23/22: 26    03/25/23:31/80  Goal status: Ongoing    PLAN: PT FREQUENCY: 2x/week  PT DURATION: 12 weeks  PLANNED INTERVENTIONS: Therapeutic exercises, Therapeutic activity, Neuromuscular re-education, Balance training, Gait training, Patient/Family education, Self Care, Joint mobilization, Stair training, DME instructions, and Electrical stimulation  PLAN FOR NEXT SESSION:  Nustep, standing in // bars as able, LE strength, supine  strengthening potentially, sit to stand transfers from elevated mat table/ plinth using RW, ensure patient feels safe with significant mitigation of fall risk during all standing activities  5:08 PM, 03/25/23 Particia Lather PT ,DPT Physical Therapist- [facility]  03/25/23, 5:08 PM

## 2023-03-30 ENCOUNTER — Encounter: Payer: BLUE CROSS/BLUE SHIELD | Admitting: Physical Therapy

## 2023-04-01 ENCOUNTER — Ambulatory Visit: Payer: BC Managed Care – PPO | Admitting: Physical Therapy

## 2023-04-01 DIAGNOSIS — R2689 Other abnormalities of gait and mobility: Secondary | ICD-10-CM

## 2023-04-01 DIAGNOSIS — R262 Difficulty in walking, not elsewhere classified: Secondary | ICD-10-CM | POA: Diagnosis not present

## 2023-04-01 DIAGNOSIS — M6281 Muscle weakness (generalized): Secondary | ICD-10-CM

## 2023-04-01 NOTE — Therapy (Signed)
OUTPATIENT PHYSICAL THERAPY LOWER EXTREMITY TREATMENT     Patient Name: Gabriella Marsh MRN: [id number] DOB:30-Jan-1966, 57 y.o., female Today's Date: 04/01/2023   PT End of Session - 04/01/23 1624     Visit Number 31    Number of Visits 42    Date for PT Re-Evaluation 06/17/23    Authorization Type [employer] Other    Authorization - Number of Visits 18    Progress Note Due on Visit 30    PT Start Time 1546    PT Stop Time 1630    PT Time Calculation (min) 44 min    Equipment Utilized During Treatment Gait belt    Activity Tolerance Patient tolerated treatment well;No increased pain;Patient limited by fatigue    Behavior During Therapy [facility] for tasks assessed/performed                Past Medical History:  Diagnosis Date   Arthritis    knees   Blood clotting disorder    on toe    Degenerative disc disease, lumbar    Diabetes mellitus without complication    GERD (gastroesophageal reflux disease)    Headache    daily - AM (has not been able to have SPG blocks lately)   Hyperlipidemia    Hypertension    Neuropathy    feet   Vertigo    3-4x/yr   Past Surgical History:  Procedure Laterality Date   ABDOMINAL HYSTERECTOMY     But still has cervix   AMPUTATION TOE Right 07/24/2021   Procedure: AMPUTATION TOE;  Surgeon: Linus Galas, DPM;  Location: [facility] ORS;  Service: Podiatry;  Laterality: Right;   CESAREAN SECTION     CHOLECYSTECTOMY     COLONOSCOPY WITH PROPOFOL N/A 02/20/2022   Procedure: COLONOSCOPY WITH PROPOFOL;  Surgeon: Wyline Mood, MD;  Location: [facility] ENDOSCOPY;  Service: Gastroenterology;  Laterality: N/A;   left arm frature  10/12/2020   OVARIAN CYST SURGERY     PARATHYROIDECTOMY  03/28/2021   Procedure: PARATHYROIDECTOMY AUTOTRANSPLANT;  Surgeon: Duanne Guess, MD;  Location: [facility] ORS;  Service: General;;   right femur surgery Right    SHOULDER SURGERY Left 12/26/014   Dr. Elfredia Nevins, [facility]   THYROIDECTOMY N/A 03/28/2021   Procedure:  THYROIDECTOMY, total;  Surgeon: Duanne Guess, MD;  Location: [facility] ORS;  Service: General;  Laterality: N/A;  Provider requesting 3 hours /180 minutes for procedure   TONSILLECTOMY AND ADENOIDECTOMY     UTERINE FIBROID SURGERY     Patient Active Problem List   Diagnosis Date Noted   Seborrheic keratoses, inflamed 03/10/2023   Breast screening declined 03/10/2023   Pap smear of cervix declined 03/10/2023   Wheelchair dependence 03/10/2023   Hypertension associated with diabetes 03/10/2023   Sepsis secondary to UTI 01/07/2023   AKI (acute kidney injury) 01/07/2023   Hypokalemia 01/07/2023   Neurogenic bladder 01/07/2023   FTT (failure to thrive) in adult 12/09/2022   Asthenia 12/09/2022   Need for influenza vaccination 09/10/2022   Primary insomnia 09/10/2022   Impaired functional mobility, balance, gait, and endurance 06/09/2022   Type 2 diabetes mellitus with diabetic neuropathy, without long-term current use of insulin 04/24/2022   Hypothyroidism 04/24/2022   Hyperlipidemia associated with type 2 diabetes mellitus 04/24/2022   Fall 04/24/2022   Dizziness 04/24/2022   Iron deficiency anemia 04/24/2022   Colon cancer screening 01/26/2022   Chronic sensorimotor polyneuropathy with axonal and demyelinating features 01/12/2022   Osteoarthritis of knees (Bilateral) 01/12/2022   Tricompartment osteoarthritis of  knees (Bilateral) 01/12/2022    Grade 1 Retrolisthesis (51mm) of L5 over S1 01/12/2022   History of proximal humerus fracture (Left) 01/12/2022   Vitamin D deficiency 01/12/2022   Hypochloremia 01/12/2022   Chronic peripheral neuropathic pain 01/12/2022   Type 2 diabetes mellitus with peripheral neuropathy 01/12/2022   Glands swollen 01/09/2022   Acute cough 01/09/2022   Rhinorrhea 01/09/2022   Left acute otitis media 01/09/2022   Pharmacologic therapy 11/12/2021   Disorder of skeletal system 11/12/2021   Problems influencing health status 11/12/2021   Chronic hand pain  (1ry area of Pain) (Bilateral) (L>R) 11/12/2021   Chronic feet pain (3ry area of Pain) (Bilateral) (L>R) 11/12/2021   Chronic shoulder pain (4th area of Pain) (Left) 11/12/2021   Chronic knee pain (5th area of Pain) (Bilateral) (L>R) 11/12/2021   Lumbar facet syndrome (Bilateral) 11/12/2021   Muscle spasm 10/22/2021   Type 2 diabetes mellitus with microalbuminuria, without long-term current use of insulin 10/22/2021   Diabetic foot infection 07/22/2021   Depression with anxiety 07/22/2021   Sepsis 07/22/2021   COVID-19 virus infection 07/22/2021   Hypocalcemia 07/17/2021   Chronic pain syndrome 07/17/2021   S/P total thyroidectomy 03/28/2021   Thyroid nodule    Schamberg's purpura 10/09/2019   Leg swelling 10/09/2019   Benign positional vertigo 02/03/2016   Common migraine with intractable migraine 06/18/2015   Anxiety 05/04/2015   Adaptation reaction 05/03/2015   DDD (degenerative disc disease), lumbosacral 05/03/2015   Insomnia due to medical condition 05/03/2015   Adiposity 05/03/2015   Disorder of peripheral nervous system 05/03/2015   Snores 05/03/2015   Diabetic peripheral neuropathy 03/06/2015   Elevated liver function tests 03/06/2015   Obesity 03/06/2015   GERD (gastroesophageal reflux disease) 03/06/2015   Multinodular goiter 03/06/2015   RLS (restless legs syndrome) 03/06/2015   Chronic low back pain (2ry area of Pain) (Bilateral) (L>R) w/o sciatica 03/06/2015   Leg cramps 03/06/2015    PCP: Jacky Kindle, FNP  REFERRING PROVIDER: Jacky Kindle, FNP  REFERRING DIAG: [phone] (ICD-10-CM) - Fall, subsequent encounter  THERAPY DIAG:  No diagnosis found.  Rationale for Evaluation and Treatment Rehabilitation  ONSET DATE: 06/18/22  SUBJECTIVE:   SUBJECTIVE STATEMENT:  Pt reports doing well at this time. She has had continued improvements in function. She is getting her KAFO and AFO next week and is very excited they will be paid for.   PERTINENT  HISTORY:  Pt reports her fall initially occurred in the evening when her husband was not home. Pt got up and had her Rolator and she walked toward her hallway. When she turned around to turn the light on she went completely down on her R leg and broke her femur distally and twisted her knee. When EMT arrived she had to straighten her LE to immobilize it. Pt went to [facility] hospital for the trauma. Upon arrival she was evaluated and had to get MRI and imaging. Progressed to 06/19/22. Pt had surgery that night. Pt had to have plate and screws in her knee and rod in her femur. Pt was in hospital for 11 days then progressed to [facility] for 30 days. Upon coming home with [facility] pt came home with catheter.  Pt husband helps with everything doing "everything" for her except she helps with cooking. Pt considered home health physical therapy but the cost of care would be too high ( $125 per visit) and this. Prior to injury pt was ambulating with rolator. Pt used  bedside commode at that time. Pt reports 6 falls during this year. Pt reports 2-3 falls with rollator and 2-3 falls walking between bathroom and bedroom and 1 fall in bedroom. Pt had another fall at [employer] where she broke her left humerus about 2 years ago.    PAIN:  Are you having pain? No   PRECAUTIONS: Other: WBAT   WEIGHT BEARING RESTRICTIONS     Ashley Mariner, MD MPH - 09/03/2022 3:15 PM EDT   Formatting of this note might be different from the original. At this time, you can continue weightbearing as tolerated to right lower extremity. Please continue working on range of motion and strengthening. We have given you prescription for physical therapy.   FALLS:  Has patient fallen in last 6 months? Yes. Number of falls 6  LIVING ENVIRONMENT: Lives with: lives with their family and lives with their spouse Lives in: House/apartment Stairs: No Has following equipment at home: Environmental consultant - 2 wheeled, Wheelchair (manual), Shower  bench, bed side commode, Grab bars, Ramped entry, and standard walker ( no wheels)  PLOF: Requires assistive device for independence, Needs assistance with ADLs, Needs assistance with homemaking, and Needs assistance with gait  PATIENT GOALS Pt would like to get legs moving a little bit more. Pt wants to be able to get to bathroom on her own.  Get upper body strength improved.      TODAY'S TREATMENT: 04/01/23 TA  Slideboard transfer from [facility] to adjustable mat table, raised mat table to height where feet are able to be flat on floor but barely  Pt transitions to supine with mod A for LE movement from PT.  Donned lite gait harness in supine with PT bridging enough to get it positioned without rolling to left and R for adjustments.   Transitioned to sitting with Mod A x 2 from PT for Trunk and LE support   Donned life gait harness to lite gait machine  Transitioned to standing with RW ant to pt for support and comfort with task.  - stayed in standing and performed 10 marches Bilaterally; pt requires assistance with R LE placement and LLE maintenance of knee extension but overall tolerates well,   Seated rest   Transitioned to standing without RW anterior ( pt holding Lite gait machine) pt completes 30 marches bilaterally then takes another seated rest break, improved LE control without need for PT managing LE placement of knee motion   Seated rest   Transitioned to standing and pt ambulates 11 feet with lite gait. PT managing A/P and rotational movement of lite gait to allow pt more stability and focus on stepping.   Transitions to WC and lite gait harness doffed   Seated heel raises 2 x 15 ea LE    Note: Portions of this document were prepared using Dragon voice recognition software and although reviewed may contain unintentional dictation errors in syntax, grammar, or spelling.        PATIENT EDUCATION:  Education details: Pt educated throughout session about proper posture  and technique with exercises. Improved exercise technique, movement at target joints, use of target muscles after min to mod verbal, visual, tactile cues.  Person educated: Patient Education method: Explanation Education comprehension: verbalized understanding   HOME EXERCISE PROGRAM: Access Code: [email or web address] URL: [email or web address] Date: 09/28/2022 Prepared by: Thresa Ross  Exercises - Seated March  - 1 x daily - 7 x weekly - 2 sets - 10 reps - Seated Long Arc Quad  - 1  x daily - 7 x weekly - 2 sets - 10 reps - Seated Heel Raise  - 1 x daily - 7 x weekly - 2 sets - 20 reps - Seated hip abduction with band  - 1 x daily - 7 x weekly - 2 sets - 10 reps - Seated Hip Adduction Isometrics with Ball  - 1 x daily - 7 x weekly - 2 sets - 10 reps  ASSESSMENT:  CLINICAL IMPRESSION: Pt presents to PT with excellent motivation for PT interventions. Pt able to use lite gait trainer with excellent results and able to take several steps with assistance of lite gait. Pt also able to tolerate much longer standing times compared to sabina stander in past. Pt will be getting AFO and KAFO next week and will have them at next session. Will work on use of these and use lite gait again as pt tolerates. Pt will continue to benefit from skilled physical therapy intervention to address impairments, improve QOL, and attain therapy goals.       OBJECTIVE IMPAIRMENTS Abnormal gait, decreased activity tolerance, decreased balance, decreased endurance, decreased mobility, difficulty walking, decreased ROM, decreased strength, impaired perceived functional ability, and impaired sensation.   ACTIVITY LIMITATIONS carrying, lifting, bending, standing, squatting, stairs, transfers, bed mobility, bathing, toileting, dressing, self feeding, hygiene/grooming, and locomotion level  PARTICIPATION LIMITATIONS: meal prep, cleaning, laundry, driving, shopping, community activity, occupation, and yard  work  PERSONAL FACTORS 3+ comorbidities: Polyneuropathy, hypertension, hyperlipidemia, diabetes mellitus, arthritis  are also affecting patient's functional outcome.   REHAB POTENTIAL: Fair secondary to comorbidities above particularly polyneuropathy affecting her lower extremity strength and muscle activation  CLINICAL DECISION MAKING: Evolving/moderate complexity  EVALUATION COMPLEXITY: Moderate   GOALS: Goals reviewed with patient? Yes  SHORT TERM GOALS: Target date: 10/26/2022     Patient will be independent in home exercise program to improve strength/mobility for better functional independence with ADLs. Baseline: No HEP currently 11/15: Completing Hep regularly.  Goal status: Met    LONG TERM GOALS: Target date: 06/17/2023      Patient will be modified independent for slide board transfers in order to improve her ability to transfer to and from wheelchair as well as bedside commode for improvement in ADLs. Baseline: Patient currently requires assistance with slide board transfers is unable to utilize bedside commode and requires use of catheter and bedpan 11/15: Pt completing slide board transfers and only requires assistance with placing slide board and maneuvering WC 1/3: Patient able to demonstrate modified independent slide board transfer from mat table to wheelchair demonstrating ability to place slide board appropriately and perform transfer with only assistance needed for chair positioning prior to transfer Goal status: MET  2.  Patient will perform sit to and from stand with min assist from physical therapist in order to indicate improved lower extremity functional strength and improved ability to perform functional transfers and functional leg activities within her home. Baseline: Patient unable to stand independently at this time due to lower extremity weakness 11/04/2022: Patient is able to perform anterior lean and pull on parallel bars and activate gluteal muscles  was unable to lift weight from wheelchair 4/4: Pt able to stand with 2 x Min A from PT and from caregiver using standard walker  Goal status: IN PROGRESS  3.  Patient will improve focus on therapeutic outcome score to 35 or greater in order to indicate improved perceived functional ability Baseline: 19 11/15: 40 12/22/22: 43 Goal status: MET  4.  Patient will perform  stand pivot transfer from wheelchair to mat table with moderate assistance or less in order to improve her ability to perform transfers within the home to improve her overall functional capacity. Baseline: Unable to complete 11/04/2022: Patient is able to perform anterior lean and pull on parallel bars and activate gluteal muscles was unable to lift weight from wheelchair 12/22/22: Able to stand in // bars with B UE support and with 2 x Mod-Max A from PT and with PT stabilizing LE at knee.  03/25/23: Unable to attend at this time secondary to fatigue and lower extremities and unable to get to standing position following 3 stands earlier in session.  Continue to practice in future sessions with patient is less fatigued Goal status: IN PROGRESS  4.  Pt will improve LEFS by 10 points or more in order to indicate improved LE function and mobility.  Baseline: 9 : 12/22/22: 26 Goal status: MET  5.  Patient will demonstrate ability to stand from elevated mat table with x 1 mod to max assist at rolling walker in order to show progress with stand pivot transfer with caregiver to bedside commode.  Baseline: 1/3: able to stand in // bars with x 2 mod-max A consistently 03/25/23: Able to stand with 2 times min assist on 2 attempts this session but still is unable to consistently stand with x 1 mod to max assist with standard Goal status: IN PROGRESS   6.  Patient will improve LEFS score by 10 points or more in order to indicate improved lower extremity function and mobility  Baseline: 12/23/22: 26    03/25/23:31/80  Goal status: Ongoing    PLAN: PT  FREQUENCY: 2x/week  PT DURATION: 12 weeks  PLANNED INTERVENTIONS: Therapeutic exercises, Therapeutic activity, Neuromuscular re-education, Balance training, Gait training, Patient/Family education, Self Care, Joint mobilization, Stair training, DME instructions, and Electrical stimulation  PLAN FOR NEXT SESSION:  Nustep, standing in // bars as able, LE strength, supine strengthening potentially, sit to stand transfers from elevated mat table/ plinth using RW, ensure patient feels safe with significant mitigation of fall risk during all standing activities, lite gait with AFOs  4:32 PM, 04/01/23 Norman Herrlich PT ,DPT Physical Therapist- [facility]     04/01/23, 4:32 PM

## 2023-04-05 ENCOUNTER — Telehealth: Payer: Self-pay

## 2023-04-05 NOTE — Telephone Encounter (Signed)
Copied from CRM [id number]. Topic: General - Other >> Apr 05, 2023 10:48 AM Geoffry Paradise G wrote: Reason for CRM:  Leta Jungling with [facility] is requesting a letter of medical necessity and authorization for the following item: L2020 knee ankle foot orthosis brace (left)

## 2023-04-05 NOTE — Telephone Encounter (Signed)
Advised 

## 2023-04-06 ENCOUNTER — Encounter: Payer: BLUE CROSS/BLUE SHIELD | Admitting: Physical Therapy

## 2023-04-06 DIAGNOSIS — L853 Xerosis cutis: Secondary | ICD-10-CM | POA: Diagnosis not present

## 2023-04-06 DIAGNOSIS — L821 Other seborrheic keratosis: Secondary | ICD-10-CM | POA: Diagnosis not present

## 2023-04-06 DIAGNOSIS — L298 Other pruritus: Secondary | ICD-10-CM | POA: Diagnosis not present

## 2023-04-08 ENCOUNTER — Ambulatory Visit: Payer: BC Managed Care – PPO

## 2023-04-08 ENCOUNTER — Telehealth: Payer: Self-pay

## 2023-04-08 DIAGNOSIS — M6281 Muscle weakness (generalized): Secondary | ICD-10-CM | POA: Diagnosis not present

## 2023-04-08 DIAGNOSIS — R262 Difficulty in walking, not elsewhere classified: Secondary | ICD-10-CM | POA: Diagnosis not present

## 2023-04-08 DIAGNOSIS — R2689 Other abnormalities of gait and mobility: Secondary | ICD-10-CM | POA: Diagnosis not present

## 2023-04-08 NOTE — Telephone Encounter (Signed)
So this is the second patient that insurance has authorized the [employer] but denied auth for the cpt code [phone] which we have to include in  the [employer]. There is no option on this one for a P2P, I can appeal it, if you want to write up a letter of necessity and maybe why we have to use an RFA code? Let me know when and I will fax the appeal.

## 2023-04-08 NOTE — Therapy (Signed)
OUTPATIENT PHYSICAL THERAPY LOWER EXTREMITY TREATMENT     Patient Name: Gabriella Marsh MRN: [id number] DOB:1965-12-29, 57 y.o., female Today's Date: 04/08/2023   PT End of Session - 04/08/23 1742     Visit Number 32    Number of Visits 42    Date for PT Re-Evaluation 06/17/23    Authorization Type [employer] Other    Authorization - Number of Visits 18    Progress Note Due on Visit 30    PT Start Time 1548    PT Stop Time 1630    PT Time Calculation (min) 42 min    Equipment Utilized During Treatment Gait belt    Activity Tolerance Patient tolerated treatment well;No increased pain;Patient limited by fatigue    Behavior During Therapy [facility] for tasks assessed/performed                Past Medical History:  Diagnosis Date   Arthritis    knees   Blood clotting disorder    on toe    Degenerative disc disease, lumbar    Diabetes mellitus without complication    GERD (gastroesophageal reflux disease)    Headache    daily - AM (has not been able to have SPG blocks lately)   Hyperlipidemia    Hypertension    Neuropathy    feet   Vertigo    3-4x/yr   Past Surgical History:  Procedure Laterality Date   ABDOMINAL HYSTERECTOMY     But still has cervix   AMPUTATION TOE Right 07/24/2021   Procedure: AMPUTATION TOE;  Surgeon: Linus Galas, DPM;  Location: [facility] ORS;  Service: Podiatry;  Laterality: Right;   CESAREAN SECTION     CHOLECYSTECTOMY     COLONOSCOPY WITH PROPOFOL N/A 02/20/2022   Procedure: COLONOSCOPY WITH PROPOFOL;  Surgeon: Wyline Mood, MD;  Location: [facility] ENDOSCOPY;  Service: Gastroenterology;  Laterality: N/A;   left arm frature  10/12/2020   OVARIAN CYST SURGERY     PARATHYROIDECTOMY  03/28/2021   Procedure: PARATHYROIDECTOMY AUTOTRANSPLANT;  Surgeon: Duanne Guess, MD;  Location: [facility] ORS;  Service: General;;   right femur surgery Right    SHOULDER SURGERY Left 12/26/014   Dr. Elfredia Nevins, [facility]   THYROIDECTOMY N/A 03/28/2021   Procedure:  THYROIDECTOMY, total;  Surgeon: Duanne Guess, MD;  Location: [facility] ORS;  Service: General;  Laterality: N/A;  Provider requesting 3 hours /180 minutes for procedure   TONSILLECTOMY AND ADENOIDECTOMY     UTERINE FIBROID SURGERY     Patient Active Problem List   Diagnosis Date Noted   Seborrheic keratoses, inflamed 03/10/2023   Breast screening declined 03/10/2023   Pap smear of cervix declined 03/10/2023   Wheelchair dependence 03/10/2023   Hypertension associated with diabetes 03/10/2023   Sepsis secondary to UTI 01/07/2023   AKI (acute kidney injury) 01/07/2023   Hypokalemia 01/07/2023   Neurogenic bladder 01/07/2023   FTT (failure to thrive) in adult 12/09/2022   Asthenia 12/09/2022   Need for influenza vaccination 09/10/2022   Primary insomnia 09/10/2022   Impaired functional mobility, balance, gait, and endurance 06/09/2022   Type 2 diabetes mellitus with diabetic neuropathy, without long-term current use of insulin 04/24/2022   Hypothyroidism 04/24/2022   Hyperlipidemia associated with type 2 diabetes mellitus 04/24/2022   Fall 04/24/2022   Dizziness 04/24/2022   Iron deficiency anemia 04/24/2022   Colon cancer screening 01/26/2022   Chronic sensorimotor polyneuropathy with axonal and demyelinating features 01/12/2022   Osteoarthritis of knees (Bilateral) 01/12/2022   Tricompartment osteoarthritis of  knees (Bilateral) 01/12/2022    Grade 1 Retrolisthesis (9mm) of L5 over S1 01/12/2022   History of proximal humerus fracture (Left) 01/12/2022   Vitamin D deficiency 01/12/2022   Hypochloremia 01/12/2022   Chronic peripheral neuropathic pain 01/12/2022   Type 2 diabetes mellitus with peripheral neuropathy 01/12/2022   Glands swollen 01/09/2022   Acute cough 01/09/2022   Rhinorrhea 01/09/2022   Left acute otitis media 01/09/2022   Pharmacologic therapy 11/12/2021   Disorder of skeletal system 11/12/2021   Problems influencing health status 11/12/2021   Chronic hand pain  (1ry area of Pain) (Bilateral) (L>R) 11/12/2021   Chronic feet pain (3ry area of Pain) (Bilateral) (L>R) 11/12/2021   Chronic shoulder pain (4th area of Pain) (Left) 11/12/2021   Chronic knee pain (5th area of Pain) (Bilateral) (L>R) 11/12/2021   Lumbar facet syndrome (Bilateral) 11/12/2021   Muscle spasm 10/22/2021   Type 2 diabetes mellitus with microalbuminuria, without long-term current use of insulin 10/22/2021   Diabetic foot infection 07/22/2021   Depression with anxiety 07/22/2021   Sepsis 07/22/2021   COVID-19 virus infection 07/22/2021   Hypocalcemia 07/17/2021   Chronic pain syndrome 07/17/2021   S/P total thyroidectomy 03/28/2021   Thyroid nodule    Schamberg's purpura 10/09/2019   Leg swelling 10/09/2019   Benign positional vertigo 02/03/2016   Common migraine with intractable migraine 06/18/2015   Anxiety 05/04/2015   Adaptation reaction 05/03/2015   DDD (degenerative disc disease), lumbosacral 05/03/2015   Insomnia due to medical condition 05/03/2015   Adiposity 05/03/2015   Disorder of peripheral nervous system 05/03/2015   Snores 05/03/2015   Diabetic peripheral neuropathy 03/06/2015   Elevated liver function tests 03/06/2015   Obesity 03/06/2015   GERD (gastroesophageal reflux disease) 03/06/2015   Multinodular goiter 03/06/2015   RLS (restless legs syndrome) 03/06/2015   Chronic low back pain (2ry area of Pain) (Bilateral) (L>R) w/o sciatica 03/06/2015   Leg cramps 03/06/2015    PCP: Jacky Kindle, FNP  REFERRING PROVIDER: Jacky Kindle, FNP  REFERRING DIAG: [phone] (ICD-10-CM) - Fall, subsequent encounter  THERAPY DIAG:  Muscle weakness (generalized)  Rationale for Evaluation and Treatment Rehabilitation  ONSET DATE: 06/18/22  SUBJECTIVE:   SUBJECTIVE STATEMENT:  Pt reports her insurance wont cover the LE KAFOs until she has an appointment with ortho doc. She reports mild pain R thigh (not new)  PERTINENT HISTORY:  Pt reports her fall  initially occurred in the evening when her husband was not home. Pt got up and had her Rolator and she walked toward her hallway. When she turned around to turn the light on she went completely down on her R leg and broke her femur distally and twisted her knee. When EMT arrived she had to straighten her LE to immobilize it. Pt went to [facility] hospital for the trauma. Upon arrival she was evaluated and had to get MRI and imaging. Progressed to 06/19/22. Pt had surgery that night. Pt had to have plate and screws in her knee and rod in her femur. Pt was in hospital for 11 days then progressed to [facility] for 30 days. Upon coming home with [facility] pt came home with catheter.  Pt husband helps with everything doing "everything" for her except she helps with cooking. Pt considered home health physical therapy but the cost of care would be too high ( $125 per visit) and this. Prior to injury pt was ambulating with rolator. Pt used bedside commode at that time. Pt reports 6  falls during this year. Pt reports 2-3 falls with rollator and 2-3 falls walking between bathroom and bedroom and 1 fall in bedroom. Pt had another fall at [employer] where she broke her left humerus about 2 years ago.    PAIN:  Are you having pain? No   PRECAUTIONS: Other: WBAT   WEIGHT BEARING RESTRICTIONS     Ashley Mariner, MD MPH - 09/03/2022 3:15 PM EDT   Formatting of this note might be different from the original. At this time, you can continue weightbearing as tolerated to right lower extremity. Please continue working on range of motion and strengthening. We have given you prescription for physical therapy.   FALLS:  Has patient fallen in last 6 months? Yes. Number of falls 6  LIVING ENVIRONMENT: Lives with: lives with their family and lives with their spouse Lives in: House/apartment Stairs: No Has following equipment at home: Environmental consultant - 2 wheeled, Wheelchair (manual), Shower bench, bed side commode, Grab  bars, Ramped entry, and standard walker ( no wheels)  PLOF: Requires assistive device for independence, Needs assistance with ADLs, Needs assistance with homemaking, and Needs assistance with gait  PATIENT GOALS Pt would like to get legs moving a little bit more. Pt wants to be able to get to bathroom on her own.  Get upper body strength improved.      TODAY'S TREATMENT: 04/08/23  Pt requests to perform seated interventions today   Discussion at beginning of session regarding plan of care as pt with few covered visits left (unbilled)  TE:  3# AW donned RLE only for the following LAQ 3x10 each LE. PT assists LLE through concentric component cues for pt to control/decrease speed with eccentric component  Seated adductor squeezes with p.ball 4x10 bilat with 3 sec hold/rep Seated soccer ball kicks and kicks into cones  x multiple reps each LE, pt then performs another set with 1.5# weight donned to LLE. Pt exhibits improved quad activation with use of soccer ball.  Seated march 4x10 each LE BTB clamshells  1x10, 1x20 rates easy Hip IR with pball adductor squeeze isometric 2x10 each LE- use of towel under LLE to assist with movement    PATIENT EDUCATION:  Education details: Pt educated throughout session about proper posture and technique with exercises. Improved exercise technique, movement at target joints, use of target muscles after min to mod verbal, visual, tactile cues. Plan  Person educated: Patient Education method: Explanation Education comprehension: verbalized understanding   HOME EXERCISE PROGRAM: Access Code: [email or web address] URL: [email or web address] Date: 09/28/2022 Prepared by: Thresa Ross  Exercises - Seated March  - 1 x daily - 7 x weekly - 2 sets - 10 reps - Seated Long Arc Quad  - 1 x daily - 7 x weekly - 2 sets - 10 reps - Seated Heel Raise  - 1 x daily - 7 x weekly - 2 sets - 20 reps - Seated hip abduction with band  - 1 x daily - 7 x weekly  - 2 sets - 10 reps - Seated Hip Adduction Isometrics with Ball  - 1 x daily - 7 x weekly - 2 sets - 10 reps  ASSESSMENT:  CLINICAL IMPRESSION: Treatment session focused on seated therex today per pt request and due to reports of fatigue. Pt tolerated interventions well without pain. She showed improvement in L quad activation with use of external cue, such as kicking a soccer ball, and was even able to perform this with 1.5# AW donned. The  pt will continue to benefit from skilled physical therapy intervention to address impairments, improve QOL, and attain therapy goals.       OBJECTIVE IMPAIRMENTS Abnormal gait, decreased activity tolerance, decreased balance, decreased endurance, decreased mobility, difficulty walking, decreased ROM, decreased strength, impaired perceived functional ability, and impaired sensation.   ACTIVITY LIMITATIONS carrying, lifting, bending, standing, squatting, stairs, transfers, bed mobility, bathing, toileting, dressing, self feeding, hygiene/grooming, and locomotion level  PARTICIPATION LIMITATIONS: meal prep, cleaning, laundry, driving, shopping, community activity, occupation, and yard work  PERSONAL FACTORS 3+ comorbidities: Polyneuropathy, hypertension, hyperlipidemia, diabetes mellitus, arthritis  are also affecting patient's functional outcome.   REHAB POTENTIAL: Fair secondary to comorbidities above particularly polyneuropathy affecting her lower extremity strength and muscle activation  CLINICAL DECISION MAKING: Evolving/moderate complexity  EVALUATION COMPLEXITY: Moderate   GOALS: Goals reviewed with patient? Yes  SHORT TERM GOALS: Target date: 10/26/2022     Patient will be independent in home exercise program to improve strength/mobility for better functional independence with ADLs. Baseline: No HEP currently 11/15: Completing Hep regularly.  Goal status: Met    LONG TERM GOALS: Target date: 06/17/2023      Patient will be modified  independent for slide board transfers in order to improve her ability to transfer to and from wheelchair as well as bedside commode for improvement in ADLs. Baseline: Patient currently requires assistance with slide board transfers is unable to utilize bedside commode and requires use of catheter and bedpan 11/15: Pt completing slide board transfers and only requires assistance with placing slide board and maneuvering WC 1/3: Patient able to demonstrate modified independent slide board transfer from mat table to wheelchair demonstrating ability to place slide board appropriately and perform transfer with only assistance needed for chair positioning prior to transfer Goal status: MET  2.  Patient will perform sit to and from stand with min assist from physical therapist in order to indicate improved lower extremity functional strength and improved ability to perform functional transfers and functional leg activities within her home. Baseline: Patient unable to stand independently at this time due to lower extremity weakness 11/04/2022: Patient is able to perform anterior lean and pull on parallel bars and activate gluteal muscles was unable to lift weight from wheelchair 4/4: Pt able to stand with 2 x Min A from PT and from caregiver using standard walker  Goal status: IN PROGRESS  3.  Patient will improve focus on therapeutic outcome score to 35 or greater in order to indicate improved perceived functional ability Baseline: 19 11/15: 40 12/22/22: 43 Goal status: MET  4.  Patient will perform stand pivot transfer from wheelchair to mat table with moderate assistance or less in order to improve her ability to perform transfers within the home to improve her overall functional capacity. Baseline: Unable to complete 11/04/2022: Patient is able to perform anterior lean and pull on parallel bars and activate gluteal muscles was unable to lift weight from wheelchair 12/22/22: Able to stand in // bars with B UE  support and with 2 x Mod-Max A from PT and with PT stabilizing LE at knee.  03/25/23: Unable to attend at this time secondary to fatigue and lower extremities and unable to get to standing position following 3 stands earlier in session.  Continue to practice in future sessions with patient is less fatigued Goal status: IN PROGRESS  4.  Pt will improve LEFS by 10 points or more in order to indicate improved LE function and mobility.  Baseline: 9 : 12/22/22: 26  Goal status: MET  5.  Patient will demonstrate ability to stand from elevated mat table with x 1 mod to max assist at rolling walker in order to show progress with stand pivot transfer with caregiver to bedside commode.  Baseline: 1/3: able to stand in // bars with x 2 mod-max A consistently 03/25/23: Able to stand with 2 times min assist on 2 attempts this session but still is unable to consistently stand with x 1 mod to max assist with standard Goal status: IN PROGRESS   6.  Patient will improve LEFS score by 10 points or more in order to indicate improved lower extremity function and mobility  Baseline: 12/23/22: 26    03/25/23:31/80  Goal status: Ongoing    PLAN: PT FREQUENCY: 2x/week  PT DURATION: 12 weeks  PLANNED INTERVENTIONS: Therapeutic exercises, Therapeutic activity, Neuromuscular re-education, Balance training, Gait training, Patient/Family education, Self Care, Joint mobilization, Stair training, DME instructions, and Electrical stimulation  PLAN FOR NEXT SESSION:  Nustep, standing in // bars as able, LE strength, supine strengthening potentially, sit to stand transfers from elevated mat table/ plinth using RW, ensure patient feels safe with significant mitigation of fall risk during all standing activities, lite gait with AFOs  5:43 PM, 04/08/23 Temple Pacini PT, DPT  Physical Therapist- [facility]     04/08/23, 5:43 PM

## 2023-04-13 ENCOUNTER — Encounter: Payer: BLUE CROSS/BLUE SHIELD | Admitting: Physical Therapy

## 2023-04-15 ENCOUNTER — Ambulatory Visit: Payer: BC Managed Care – PPO | Admitting: Physical Therapy

## 2023-04-20 ENCOUNTER — Encounter: Payer: BLUE CROSS/BLUE SHIELD | Admitting: Physical Therapy

## 2023-04-21 ENCOUNTER — Encounter: Payer: Self-pay | Admitting: Family Medicine

## 2023-04-21 DIAGNOSIS — E1165 Type 2 diabetes mellitus with hyperglycemia: Secondary | ICD-10-CM

## 2023-04-21 DIAGNOSIS — G2581 Restless legs syndrome: Secondary | ICD-10-CM

## 2023-04-22 ENCOUNTER — Ambulatory Visit: Payer: BC Managed Care – PPO | Admitting: Physical Therapy

## 2023-04-22 ENCOUNTER — Other Ambulatory Visit: Payer: Self-pay | Admitting: Family Medicine

## 2023-04-22 ENCOUNTER — Encounter: Payer: Self-pay | Admitting: Family Medicine

## 2023-04-22 DIAGNOSIS — E1140 Type 2 diabetes mellitus with diabetic neuropathy, unspecified: Secondary | ICD-10-CM

## 2023-04-22 MED ORDER — NORTRIPTYLINE HCL 25 MG PO CAPS: 50.0000 mg | ORAL_CAPSULE | Freq: Every day | ORAL | 3 refills | Status: DC

## 2023-04-22 MED ORDER — LANTUS SOLOSTAR 100 UNIT/ML ~~LOC~~ SOPN: 24.0000 [IU] | PEN_INJECTOR | Freq: Every day | SUBCUTANEOUS | 1 refills | Status: DC

## 2023-04-22 MED ORDER — TRULICITY 3 MG/0.5ML ~~LOC~~ SOAJ: 3.0000 mg | SUBCUTANEOUS | 3 refills | Status: DC

## 2023-04-22 MED ORDER — GABAPENTIN 600 MG PO TABS: 600.0000 mg | ORAL_TABLET | Freq: Three times a day (TID) | ORAL | 0 refills | Status: DC

## 2023-04-22 MED ORDER — ATORVASTATIN CALCIUM 20 MG PO TABS: ORAL_TABLET | ORAL | 0 refills | Status: DC

## 2023-04-22 NOTE — Telephone Encounter (Signed)
[name] I have pended the medicines for you to review. She has Synthroid 200 mcg and 25 mcg?

## 2023-04-27 ENCOUNTER — Encounter: Payer: BLUE CROSS/BLUE SHIELD | Admitting: Physical Therapy

## 2023-04-28 DIAGNOSIS — E1321 Other specified diabetes mellitus with diabetic nephropathy: Secondary | ICD-10-CM | POA: Diagnosis not present

## 2023-04-29 ENCOUNTER — Ambulatory Visit: Payer: BC Managed Care – PPO | Attending: Family Medicine | Admitting: Physical Therapy

## 2023-04-29 ENCOUNTER — Other Ambulatory Visit: Payer: Self-pay | Admitting: Family Medicine

## 2023-04-29 DIAGNOSIS — R262 Difficulty in walking, not elsewhere classified: Secondary | ICD-10-CM

## 2023-04-29 DIAGNOSIS — M6281 Muscle weakness (generalized): Secondary | ICD-10-CM

## 2023-04-29 DIAGNOSIS — R2689 Other abnormalities of gait and mobility: Secondary | ICD-10-CM | POA: Diagnosis not present

## 2023-04-29 MED ORDER — SEMAGLUTIDE (1 MG/DOSE) 4 MG/3ML ~~LOC~~ SOPN: 1.0000 mg | PEN_INJECTOR | SUBCUTANEOUS | 0 refills | Status: DC

## 2023-04-29 NOTE — Therapy (Signed)
OUTPATIENT PHYSICAL THERAPY LOWER EXTREMITY TREATMENT     Patient Name: Gabriella Marsh MRN: [id number] DOB:Oct 16, 1966, 57 y.o., female Today's Date: 04/30/2023   PT End of Session - 04/29/23 1540     Visit Number 33    Number of Visits 42    Date for PT Re-Evaluation 06/17/23    Authorization Type BCBS Other    Authorization - Visit Number 16    Authorization - Number of Visits 18    Progress Note Due on Visit 30    PT Start Time 1545    PT Stop Time 1630    PT Time Calculation (min) 45 min    Equipment Utilized During Treatment Gait belt    Activity Tolerance Patient tolerated treatment well;No increased pain;Patient limited by fatigue    Behavior During Therapy [facility] for tasks assessed/performed                 Past Medical History:  Diagnosis Date   Arthritis    knees   Blood clotting disorder (HCC)    on toe    Degenerative disc disease, lumbar    Diabetes mellitus without complication (HCC)    GERD (gastroesophageal reflux disease)    Headache    daily - AM (has not been able to have SPG blocks lately)   Hyperlipidemia    Hypertension    Neuropathy    feet   Vertigo    3-4x/yr   Past Surgical History:  Procedure Laterality Date   ABDOMINAL HYSTERECTOMY     But still has cervix   AMPUTATION TOE Right 07/24/2021   Procedure: AMPUTATION TOE;  Surgeon: Linus Galas, DPM;  Location: [facility] ORS;  Service: Podiatry;  Laterality: Right;   CESAREAN SECTION     CHOLECYSTECTOMY     COLONOSCOPY WITH PROPOFOL N/A 02/20/2022   Procedure: COLONOSCOPY WITH PROPOFOL;  Surgeon: Wyline Mood, MD;  Location: [facility] ENDOSCOPY;  Service: Gastroenterology;  Laterality: N/A;   left arm frature  10/12/2020   OVARIAN CYST SURGERY     PARATHYROIDECTOMY  03/28/2021   Procedure: PARATHYROIDECTOMY AUTOTRANSPLANT;  Surgeon: Duanne Guess, MD;  Location: [facility] ORS;  Service: General;;   right femur surgery Right    SHOULDER SURGERY Left 12/26/014   Dr. Elfredia Nevins, [facility]    THYROIDECTOMY N/A 03/28/2021   Procedure: THYROIDECTOMY, total;  Surgeon: Duanne Guess, MD;  Location: [facility] ORS;  Service: General;  Laterality: N/A;  Provider requesting 3 hours /180 minutes for procedure   TONSILLECTOMY AND ADENOIDECTOMY     UTERINE FIBROID SURGERY     Patient Active Problem List   Diagnosis Date Noted   Seborrheic keratoses, inflamed 03/10/2023   Breast screening declined 03/10/2023   Pap smear of cervix declined 03/10/2023   Wheelchair dependence 03/10/2023   Hypertension associated with diabetes (HCC) 03/10/2023   Sepsis secondary to UTI (HCC) 01/07/2023   AKI (acute kidney injury) (HCC) 01/07/2023   Hypokalemia 01/07/2023   Neurogenic bladder 01/07/2023   FTT (failure to thrive) in adult 12/09/2022   Asthenia 12/09/2022   Need for influenza vaccination 09/10/2022   Primary insomnia 09/10/2022   Impaired functional mobility, balance, gait, and endurance 06/09/2022   Type 2 diabetes mellitus with diabetic neuropathy, without long-term current use of insulin (HCC) 04/24/2022   Hypothyroidism 04/24/2022   Hyperlipidemia associated with type 2 diabetes mellitus (HCC) 04/24/2022   Fall 04/24/2022   Dizziness 04/24/2022   Iron deficiency anemia 04/24/2022   Colon cancer screening 01/26/2022   Chronic sensorimotor polyneuropathy with axonal  and demyelinating features 01/12/2022   Osteoarthritis of knees (Bilateral) 01/12/2022   Tricompartment osteoarthritis of knees (Bilateral) 01/12/2022    Grade 1 Retrolisthesis (9mm) of L5 over S1 01/12/2022   History of proximal humerus fracture (Left) 01/12/2022   Vitamin D deficiency 01/12/2022   Hypochloremia 01/12/2022   Chronic peripheral neuropathic pain 01/12/2022   Type 2 diabetes mellitus with peripheral neuropathy (HCC) 01/12/2022   Glands swollen 01/09/2022   Acute cough 01/09/2022   Rhinorrhea 01/09/2022   Left acute otitis media 01/09/2022   Pharmacologic therapy 11/12/2021   Disorder of skeletal system  11/12/2021   Problems influencing health status 11/12/2021   Chronic hand pain (1ry area of Pain) (Bilateral) (L>R) 11/12/2021   Chronic feet pain (3ry area of Pain) (Bilateral) (L>R) 11/12/2021   Chronic shoulder pain (4th area of Pain) (Left) 11/12/2021   Chronic knee pain (5th area of Pain) (Bilateral) (L>R) 11/12/2021   Lumbar facet syndrome (Bilateral) 11/12/2021   Muscle spasm 10/22/2021   Type 2 diabetes mellitus with microalbuminuria, without long-term current use of insulin (HCC) 10/22/2021   Diabetic foot infection (HCC) 07/22/2021   Depression with anxiety 07/22/2021   Sepsis (HCC) 07/22/2021   COVID-19 virus infection 07/22/2021   Hypocalcemia 07/17/2021   Chronic pain syndrome 07/17/2021   S/P total thyroidectomy 03/28/2021   Thyroid nodule    Schamberg's purpura 10/09/2019   Leg swelling 10/09/2019   Benign positional vertigo 02/03/2016   Common migraine with intractable migraine 06/18/2015   Anxiety 05/04/2015   Adaptation reaction 05/03/2015   DDD (degenerative disc disease), lumbosacral 05/03/2015   Insomnia due to medical condition 05/03/2015   Adiposity 05/03/2015   Disorder of peripheral nervous system 05/03/2015   Snores 05/03/2015   Diabetic peripheral neuropathy (HCC) 03/06/2015   Elevated liver function tests 03/06/2015   Obesity 03/06/2015   GERD (gastroesophageal reflux disease) 03/06/2015   Multinodular goiter 03/06/2015   RLS (restless legs syndrome) 03/06/2015   Chronic low back pain (2ry area of Pain) (Bilateral) (L>R) w/o sciatica 03/06/2015   Leg cramps 03/06/2015    PCP: Jacky Kindle, FNP  REFERRING PROVIDER: Jacky Kindle, FNP  REFERRING DIAG: [phone] (ICD-10-CM) - Fall, subsequent encounter  THERAPY DIAG:  Difficulty in walking, not elsewhere classified  Other abnormalities of gait and mobility  Muscle weakness (generalized)  Rationale for Evaluation and Treatment Rehabilitation  ONSET DATE: 06/18/22  SUBJECTIVE:    SUBJECTIVE STATEMENT:  Patient reports she has been doing fairly well and got her KAFO and her AFO yesterday.  Patient reports frustration with the orthotist and insurance company as they were not communicating well with the faxes sent and received to get her equipment in a timely manner.  PERTINENT HISTORY:  Pt reports her fall initially occurred in the evening when her husband was not home. Pt got up and had her Rolator and she walked toward her hallway. When she turned around to turn the light on she went completely down on her R leg and broke her femur distally and twisted her knee. When EMT arrived she had to straighten her LE to immobilize it. Pt went to [facility] hospital for the trauma. Upon arrival she was evaluated and had to get MRI and imaging. Progressed to 06/19/22. Pt had surgery that night. Pt had to have plate and screws in her knee and rod in her femur. Pt was in hospital for 11 days then progressed to [facility] for 30 days. Upon coming home with [facility] pt came home  with catheter.  Pt husband helps with everything doing "everything" for her except she helps with cooking. Pt considered home health physical therapy but the cost of care would be too high ( $125 per visit) and this. Prior to injury pt was ambulating with rolator. Pt used bedside commode at that time. Pt reports 6 falls during this year. Pt reports 2-3 falls with rollator and 2-3 falls walking between bathroom and bedroom and 1 fall in bedroom. Pt had another fall at [employer] where she broke her left humerus about 2 years ago.    PAIN:  Are you having pain? No   PRECAUTIONS: Other: WBAT   WEIGHT BEARING RESTRICTIONS     Ashley Mariner, MD MPH - 09/03/2022 3:15 PM EDT   Formatting of this note might be different from the original. At this time, you can continue weightbearing as tolerated to right lower extremity. Please continue working on range of motion and strengthening. We have given you  prescription for physical therapy.   FALLS:  Has patient fallen in last 6 months? Yes. Number of falls 6  LIVING ENVIRONMENT: Lives with: lives with their family and lives with their spouse Lives in: House/apartment Stairs: No Has following equipment at home: Environmental consultant - 2 wheeled, Wheelchair (manual), Shower bench, bed side commode, Grab bars, Ramped entry, and standard walker ( no wheels)  PLOF: Requires assistive device for independence, Needs assistance with ADLs, Needs assistance with homemaking, and Needs assistance with gait  PATIENT GOALS Pt would like to get legs moving a little bit more. Pt wants to be able to get to bathroom on her own.  Get upper body strength improved.      TODAY'S TREATMENT: 04/30/23 Therapeutic activities for all of the below as they were focused on improving patient's ambulatory capacity. Slide board transfer from wheelchair to mat table  Min assist for trunk support with transition to supine for donning of light gait harness  Min assist transition from supine to sitting  Donned bilateral orthotic devices with KAFO on the left and AFO on the right  Stretch patient into a light gait and performed 3 ambulatory bouts with light gait  Patient's initial ambulatory bout using the light gait to support her patient ambulated approximately 3 feet and was having significant difficulty with left lower extremity swing phase of gait likely due to increased weight due to KAFO.  Patient then transition to seated position lowering the light gait and then was positioned for second and longer ambulatory bout in a straight line to increase the ease of turning. Second ambulatory bout using the light gait to support her patient completes 32 feet of ambulation with moderate assistance for initial swing phase of left lower extremity once the left lower extremity is off the ground and friction from the shoes are reduced patient is able to swing the left leg forward was unable to  do this independently.  Following this patient lowered with light gait and allowed another seated rest break and was in position for another ambulatory bout in a straight line in the long hallway  With her ambulatory utilizing the light gait to support her bout patient was assisted with weight shifting to her right side at the hips and was allowed to independently swing left lower extremity forward.  This was effective for ambulation but did require increased effort on the patient's part patient only ambulate ambulate 21 feet prior to fatigue.  Patient then lowered on light gait and allowed another seated  rest break  With her third and final ambulatory bout patient using a light gait to support her was able to ambulate 30 feet but required assistance with either weight shift or left lower extremity initial swing phase patient began to experience some fatigue.  Patient is right lower extremity with fatigue began to experience some slight ankle inversion and instability as well as decreased knee stability.  This is a long as patient is ambulated in a significant amount of time and also required the assistance of the light gait and assistance of physical therapist in order to safely ambulate.  Patient requires assistance with unlocking the brace with each ambulatory bowel has a light gait holds her to a height where she cannot bend to reach the unlocking mechanism of her left KAFO.       PATIENT EDUCATION:  Education details: Pt educated throughout session about proper posture and technique with exercises. Improved exercise technique, movement at target joints, use of target muscles after min to mod verbal, visual, tactile cues. Plan  Person educated: Patient Education method: Explanation Education comprehension: verbalized understanding   HOME EXERCISE PROGRAM: Access Code: [email or web address] URL: [email or web address] Date: 09/28/2022 Prepared by: Thresa Ross  Exercises - Seated  March  - 1 x daily - 7 x weekly - 2 sets - 10 reps - Seated Long Arc Quad  - 1 x daily - 7 x weekly - 2 sets - 10 reps - Seated Heel Raise  - 1 x daily - 7 x weekly - 2 sets - 20 reps - Seated hip abduction with band  - 1 x daily - 7 x weekly - 2 sets - 10 reps - Seated Hip Adduction Isometrics with Ball  - 1 x daily - 7 x weekly - 2 sets - 10 reps  ASSESSMENT:  CLINICAL IMPRESSION:  Patient presents with good motivation for completion of physical therapy activities.  Patient began with light gait training in her initial utilization of her KAFO and AFO devices on her left and right legs respectively.  Patient does have a lot of difficulty with swing phase of the left lower extremity likely due to increased weight to the KAFO causes as well as the friction with the new shoes of the KAFOs are gone to has with the floor makes it increased difficulty with the initial swing phase of gait.  Patient is able to swing the leg independently once the leg is off the floor and is able to place it for 4 momentum but does require the assistance of the light gait as well as with assistance from the physical therapist for either adequate weight shifting or for lifting the leg off the floor in the initial swing phase.  Patient provided with various cues throughout the session to increase the ease of lower extremity movement but will require significant additional ambulatory training and functional strengthening to increase the ease of ambulation to make this more of a functional task of patient can perform transfers and ambulation short distances without significant assistance from equipment or from physical therapist or caregivers.Pt will continue to benefit from skilled physical therapy intervention to address impairments, improve QOL, and attain therapy goals.     OBJECTIVE IMPAIRMENTS Abnormal gait, decreased activity tolerance, decreased balance, decreased endurance, decreased mobility, difficulty walking,  decreased ROM, decreased strength, impaired perceived functional ability, and impaired sensation.   ACTIVITY LIMITATIONS carrying, lifting, bending, standing, squatting, stairs, transfers, bed mobility, bathing, toileting, dressing, self feeding, hygiene/grooming, and locomotion level  PARTICIPATION LIMITATIONS: meal  prep, cleaning, laundry, driving, shopping, community activity, occupation, and yard work  PERSONAL FACTORS 3+ comorbidities: Polyneuropathy, hypertension, hyperlipidemia, diabetes mellitus, arthritis  are also affecting patient's functional outcome.   REHAB POTENTIAL: Fair secondary to comorbidities above particularly polyneuropathy affecting her lower extremity strength and muscle activation  CLINICAL DECISION MAKING: Evolving/moderate complexity  EVALUATION COMPLEXITY: Moderate   GOALS: Goals reviewed with patient? Yes  SHORT TERM GOALS: Target date: 10/26/2022     Patient will be independent in home exercise program to improve strength/mobility for better functional independence with ADLs. Baseline: No HEP currently 11/15: Completing Hep regularly.  Goal status: Met    LONG TERM GOALS: Target date: 06/17/2023      Patient will be modified independent for slide board transfers in order to improve her ability to transfer to and from wheelchair as well as bedside commode for improvement in ADLs. Baseline: Patient currently requires assistance with slide board transfers is unable to utilize bedside commode and requires use of catheter and bedpan 11/15: Pt completing slide board transfers and only requires assistance with placing slide board and maneuvering WC 1/3: Patient able to demonstrate modified independent slide board transfer from mat table to wheelchair demonstrating ability to place slide board appropriately and perform transfer with only assistance needed for chair positioning prior to transfer Goal status: MET  2.  Patient will perform sit to and from stand  with min assist from physical therapist in order to indicate improved lower extremity functional strength and improved ability to perform functional transfers and functional leg activities within her home. Baseline: Patient unable to stand independently at this time due to lower extremity weakness 11/04/2022: Patient is able to perform anterior lean and pull on parallel bars and activate gluteal muscles was unable to lift weight from wheelchair 4/4: Pt able to stand with 2 x Min A from PT and from caregiver using standard walker  Goal status: IN PROGRESS  3.  Patient will improve focus on therapeutic outcome score to 35 or greater in order to indicate improved perceived functional ability Baseline: 19 11/15: 40 12/22/22: 43 Goal status: MET  4.  Patient will perform stand pivot transfer from wheelchair to mat table with moderate assistance or less in order to improve her ability to perform transfers within the home to improve her overall functional capacity. Baseline: Unable to complete 11/04/2022: Patient is able to perform anterior lean and pull on parallel bars and activate gluteal muscles was unable to lift weight from wheelchair 12/22/22: Able to stand in // bars with B UE support and with 2 x Mod-Max A from PT and with PT stabilizing LE at knee.  03/25/23: Unable to attend at this time secondary to fatigue and lower extremities and unable to get to standing position following 3 stands earlier in session.  Continue to practice in future sessions with patient is less fatigued Goal status: IN PROGRESS  4.  Pt will improve LEFS by 10 points or more in order to indicate improved LE function and mobility.  Baseline: 9 : 12/22/22: 26 Goal status: MET  5.  Patient will demonstrate ability to stand from elevated mat table with x 1 mod to max assist at rolling walker in order to show progress with stand pivot transfer with caregiver to bedside commode.  Baseline: 1/3: able to stand in // bars with x 2 mod-max A  consistently 03/25/23: Able to stand with 2 times min assist on 2 attempts this session but still is unable to consistently stand with  x 1 mod to max assist with standard Goal status: IN PROGRESS   6.  Patient will improve LEFS score by 10 points or more in order to indicate improved lower extremity function and mobility  Baseline: 12/23/22: 26    03/25/23:31/80  Goal status: Ongoing    PLAN: PT FREQUENCY: 2x/week  PT DURATION: 12 weeks  PLANNED INTERVENTIONS: Therapeutic exercises, Therapeutic activity, Neuromuscular re-education, Balance training, Gait training, Patient/Family education, Self Care, Joint mobilization, Stair training, DME instructions, and Electrical stimulation  PLAN FOR NEXT SESSION:  Nustep, standing in // bars as able, LE strength, supine strengthening potentially, sit to stand transfers from elevated mat table/ plinth using RW, ensure patient feels safe with significant mitigation of fall risk during all standing activities, lite gait with AFOs  8:01 AM, 04/30/23 Norman Herrlich PT ,DPT Physical Therapist- [facility]      04/30/23, 8:01 AM

## 2023-04-30 ENCOUNTER — Telehealth: Payer: Self-pay | Admitting: Family Medicine

## 2023-04-30 ENCOUNTER — Encounter: Payer: Self-pay | Admitting: Physical Therapy

## 2023-04-30 NOTE — Telephone Encounter (Signed)
Prior authorization required  Name: Jahniece Oshana pen 1mg /dose No key code provided

## 2023-05-03 ENCOUNTER — Telehealth: Payer: Self-pay

## 2023-05-03 NOTE — Telephone Encounter (Signed)
Insurance Treatment Denial Note  Date order was entered:  Order entered by: Edward Jolly, MD Requested treatment: Qutenza Reason for denial: Treatment not covered by plan. Recommended for approval:  see below   I tried to get authorization for CPT code 40981 per Bobs advice, after they denied the [phone] and they denied that one as well. They say the treatment is investigational and therefore not covered. I have no other options.

## 2023-05-04 ENCOUNTER — Telehealth: Payer: Self-pay

## 2023-05-04 ENCOUNTER — Encounter: Payer: BLUE CROSS/BLUE SHIELD | Admitting: Physical Therapy

## 2023-05-04 NOTE — Telephone Encounter (Signed)
For the Qutenza Her insurance needs you to write a letter stating exactly what this procedure entails, that it is not a back procedure or injection. Describe what you will be doing. And how many patches you will need, although I already had put in there four. They are getting confused because the CPT codes we use describe an actual injection and not just a topical.

## 2023-05-05 ENCOUNTER — Other Ambulatory Visit: Payer: Self-pay | Admitting: Family Medicine

## 2023-05-05 DIAGNOSIS — K219 Gastro-esophageal reflux disease without esophagitis: Secondary | ICD-10-CM

## 2023-05-05 NOTE — Telephone Encounter (Signed)
Requested Prescriptions  Pending Prescriptions Disp Refills   omeprazole (PRILOSEC) 20 MG capsule [Pharmacy Med Name: OMEPRAZOLE DR CAPS 20MG ] 90 capsule 3    Sig: TAKE 1 CAPSULE DAILY     Gastroenterology: Proton Pump Inhibitors Passed - 05/05/2023  5:32 PM      Passed - Valid encounter within last 12 months    Recent Outpatient Visits           1 month ago Type 2 diabetes mellitus with diabetic neuropathy, without long-term current use of insulin [facility])   [place] [facility] Merita Norton T, FNP   3 months ago [facility]   [place] [facility] Alfredia Ferguson, PA-C   4 months ago Type 2 diabetes mellitus with diabetic neuropathy, without long-term current use of insulin [facility])   [place] Merita Norton T, FNP   7 months ago Type 2 diabetes mellitus with diabetic neuropathy, without long-term current use of insulin [facility])   [facility] Jacky Kindle, FNP   11 months ago Fall, subsequent encounter   [facility] Jacky Kindle, FNP       Future Appointments             In 1 month Suzie Portela, Daryl Eastern, FNP [facility]   In 4 months [place], Elana Alm [facility]

## 2023-05-05 NOTE — Telephone Encounter (Signed)
PA started

## 2023-05-06 ENCOUNTER — Ambulatory Visit: Payer: BC Managed Care – PPO | Admitting: Physical Therapy

## 2023-05-06 ENCOUNTER — Encounter: Payer: Self-pay | Admitting: Student in an Organized Health Care Education/Training Program

## 2023-05-06 DIAGNOSIS — R262 Difficulty in walking, not elsewhere classified: Secondary | ICD-10-CM

## 2023-05-06 DIAGNOSIS — R2689 Other abnormalities of gait and mobility: Secondary | ICD-10-CM

## 2023-05-06 DIAGNOSIS — M6281 Muscle weakness (generalized): Secondary | ICD-10-CM | POA: Diagnosis not present

## 2023-05-06 NOTE — Telephone Encounter (Signed)
Approved.  

## 2023-05-06 NOTE — Telephone Encounter (Signed)
Message from [employer]. Authorization Expiration Date: May 03, 2024.

## 2023-05-06 NOTE — Therapy (Addendum)
OUTPATIENT PHYSICAL THERAPY LOWER EXTREMITY TREATMENT     Patient Name: Gabriella Marsh MRN: [id number] DOB:Jul 25, 1966, 57 y.o., female Today's Date: 05/06/2023   PT End of Session - 05/06/23 1702     Visit Number 34    Number of Visits 42    Date for PT Re-Evaluation 06/17/23    Authorization Type BCBS Other    Authorization - Number of Visits 18    Progress Note Due on Visit 40    PT Start Time 1605    PT Stop Time 1655    PT Time Calculation (min) 50 min    Equipment Utilized During Treatment Gait belt    Activity Tolerance Patient tolerated treatment well;No increased pain;Patient limited by fatigue    Behavior During Therapy [facility] for tasks assessed/performed                  Past Medical History:  Diagnosis Date   Arthritis    knees   Blood clotting disorder (HCC)    on toe    Degenerative disc disease, lumbar    Diabetes mellitus without complication (HCC)    GERD (gastroesophageal reflux disease)    Headache    daily - AM (has not been able to have SPG blocks lately)   Hyperlipidemia    Hypertension    Neuropathy    feet   Vertigo    3-4x/yr   Past Surgical History:  Procedure Laterality Date   ABDOMINAL HYSTERECTOMY     But still has cervix   AMPUTATION TOE Right 07/24/2021   Procedure: AMPUTATION TOE;  Surgeon: Linus Galas, DPM;  Location: [facility] ORS;  Service: Podiatry;  Laterality: Right;   CESAREAN SECTION     CHOLECYSTECTOMY     COLONOSCOPY WITH PROPOFOL N/A 02/20/2022   Procedure: COLONOSCOPY WITH PROPOFOL;  Surgeon: Wyline Mood, MD;  Location: [facility] ENDOSCOPY;  Service: Gastroenterology;  Laterality: N/A;   left arm frature  10/12/2020   OVARIAN CYST SURGERY     PARATHYROIDECTOMY  03/28/2021   Procedure: PARATHYROIDECTOMY AUTOTRANSPLANT;  Surgeon: Duanne Guess, MD;  Location: [facility] ORS;  Service: General;;   right femur surgery Right    SHOULDER SURGERY Left 12/26/014   Dr. Elfredia Nevins, [facility]   THYROIDECTOMY N/A 03/28/2021    Procedure: THYROIDECTOMY, total;  Surgeon: Duanne Guess, MD;  Location: [facility] ORS;  Service: General;  Laterality: N/A;  Provider requesting 3 hours /180 minutes for procedure   TONSILLECTOMY AND ADENOIDECTOMY     UTERINE FIBROID SURGERY     Patient Active Problem List   Diagnosis Date Noted   Seborrheic keratoses, inflamed 03/10/2023   Breast screening declined 03/10/2023   Pap smear of cervix declined 03/10/2023   Wheelchair dependence 03/10/2023   Hypertension associated with diabetes (HCC) 03/10/2023   Sepsis secondary to UTI (HCC) 01/07/2023   AKI (acute kidney injury) (HCC) 01/07/2023   Hypokalemia 01/07/2023   Neurogenic bladder 01/07/2023   FTT (failure to thrive) in adult 12/09/2022   Asthenia 12/09/2022   Need for influenza vaccination 09/10/2022   Primary insomnia 09/10/2022   Impaired functional mobility, balance, gait, and endurance 06/09/2022   Type 2 diabetes mellitus with diabetic neuropathy, without long-term current use of insulin (HCC) 04/24/2022   Hypothyroidism 04/24/2022   Hyperlipidemia associated with type 2 diabetes mellitus (HCC) 04/24/2022   Fall 04/24/2022   Dizziness 04/24/2022   Iron deficiency anemia 04/24/2022   Colon cancer screening 01/26/2022   Chronic sensorimotor polyneuropathy with axonal and demyelinating features 01/12/2022   Osteoarthritis  of knees (Bilateral) 01/12/2022   Tricompartment osteoarthritis of knees (Bilateral) 01/12/2022    Grade 1 Retrolisthesis (9mm) of L5 over S1 01/12/2022   History of proximal humerus fracture (Left) 01/12/2022   Vitamin D deficiency 01/12/2022   Hypochloremia 01/12/2022   Chronic peripheral neuropathic pain 01/12/2022   Type 2 diabetes mellitus with peripheral neuropathy (HCC) 01/12/2022   Glands swollen 01/09/2022   Acute cough 01/09/2022   Rhinorrhea 01/09/2022   Left acute otitis media 01/09/2022   Pharmacologic therapy 11/12/2021   Disorder of skeletal system 11/12/2021   Problems influencing  health status 11/12/2021   Chronic hand pain (1ry area of Pain) (Bilateral) (L>R) 11/12/2021   Chronic feet pain (3ry area of Pain) (Bilateral) (L>R) 11/12/2021   Chronic shoulder pain (4th area of Pain) (Left) 11/12/2021   Chronic knee pain (5th area of Pain) (Bilateral) (L>R) 11/12/2021   Lumbar facet syndrome (Bilateral) 11/12/2021   Muscle spasm 10/22/2021   Type 2 diabetes mellitus with microalbuminuria, without long-term current use of insulin (HCC) 10/22/2021   Diabetic foot infection (HCC) 07/22/2021   Depression with anxiety 07/22/2021   Sepsis (HCC) 07/22/2021   COVID-19 virus infection 07/22/2021   Hypocalcemia 07/17/2021   Chronic pain syndrome 07/17/2021   S/P total thyroidectomy 03/28/2021   Thyroid nodule    Schamberg's purpura 10/09/2019   Leg swelling 10/09/2019   Benign positional vertigo 02/03/2016   Common migraine with intractable migraine 06/18/2015   Anxiety 05/04/2015   Adaptation reaction 05/03/2015   DDD (degenerative disc disease), lumbosacral 05/03/2015   Insomnia due to medical condition 05/03/2015   Adiposity 05/03/2015   Disorder of peripheral nervous system 05/03/2015   Snores 05/03/2015   Diabetic peripheral neuropathy (HCC) 03/06/2015   Elevated liver function tests 03/06/2015   Obesity 03/06/2015   GERD (gastroesophageal reflux disease) 03/06/2015   Multinodular goiter 03/06/2015   RLS (restless legs syndrome) 03/06/2015   Chronic low back pain (2ry area of Pain) (Bilateral) (L>R) w/o sciatica 03/06/2015   Leg cramps 03/06/2015    PCP: Jacky Kindle, FNP  REFERRING PROVIDER: Jacky Kindle, FNP  REFERRING DIAG: [phone] (ICD-10-CM) - Fall, subsequent encounter  THERAPY DIAG:  Difficulty in walking, not elsewhere classified  Other abnormalities of gait and mobility  Muscle weakness (generalized)  Rationale for Evaluation and Treatment Rehabilitation  ONSET DATE: 06/18/22  SUBJECTIVE:   SUBJECTIVE STATEMENT: Patient reports no  significant changes since last session.  Reports everything is stable tingled.  Has not been able to perform any standing since last session.  But she has been continuing to work on her exercises.  PERTINENT HISTORY:  Pt reports her fall initially occurred in the evening when her husband was not home. Pt got up and had her Rolator and she walked toward her hallway. When she turned around to turn the light on she went completely down on her R leg and broke her femur distally and twisted her knee. When EMT arrived she had to straighten her LE to immobilize it. Pt went to [facility] hospital for the trauma. Upon arrival she was evaluated and had to get MRI and imaging. Progressed to 06/19/22. Pt had surgery that night. Pt had to have plate and screws in her knee and rod in her femur. Pt was in hospital for 11 days then progressed to [facility] for 30 days. Upon coming home with [facility] pt came home with catheter.  Pt husband helps with everything doing "everything" for her except she helps with cooking.  Pt considered home health physical therapy but the cost of care would be too high ( $125 per visit) and this. Prior to injury pt was ambulating with rolator. Pt used bedside commode at that time. Pt reports 6 falls during this year. Pt reports 2-3 falls with rollator and 2-3 falls walking between bathroom and bedroom and 1 fall in bedroom. Pt had another fall at [employer] where she broke her left humerus about 2 years ago.    PAIN:  Are you having pain? No   PRECAUTIONS: Other: WBAT   WEIGHT BEARING RESTRICTIONS     Ashley Mariner, MD MPH - 09/03/2022 3:15 PM EDT   Formatting of this note might be different from the original. At this time, you can continue weightbearing as tolerated to right lower extremity. Please continue working on range of motion and strengthening. We have given you prescription for physical therapy.   FALLS:  Has patient fallen in last 6 months? Yes. Number of falls  6  LIVING ENVIRONMENT: Lives with: lives with their family and lives with their spouse Lives in: House/apartment Stairs: No Has following equipment at home: Environmental consultant - 2 wheeled, Wheelchair (manual), Shower bench, bed side commode, Grab bars, Ramped entry, and standard walker ( no wheels)  PLOF: Requires assistive device for independence, Needs assistance with ADLs, Needs assistance with homemaking, and Needs assistance with gait  PATIENT GOALS Pt would like to get legs moving a little bit more. Pt wants to be able to get to bathroom on her own.  Get upper body strength improved.      TODAY'S TREATMENT: 05/06/23 Therapeutic activities for all of the below as they were focused on improving patient's ambulatory capacity.  Slide board transfer from wheelchair to mat table  Min assist for trunk support with transition to supine for donning of light gait harness  Min assist transition from supine to sitting  Donned bilateral orthotic devices with KAFO on the left and AFO on the right  Transferred patient into a light gait trainer which max assists patient into standing and supports patient's body weight.  -On the first session in light gait patient stands with right lower extremity elevated on step and left lower extremity dangling in the air and patient practices with flexion and relaxation of the left lower extremity for transfer to get related activities.  Following this patient practices with knee flexion and extension for practice with locking her KAFO.  Patient performs 2 sets of 10 repetitions of each of the above following this patient perform seated rest break  ALL AMBULATORY BOUTS COMPLETED WITH ASSISTANCE OF LITE GAIT TRAINER, LITE GAIT PERFORMS STS TRANSFER FOR PATIENT Following this practice patient completes 4 ambulatory bouts in the light gait.  First ambulatory bout included a left turn.  With all ambulatory bout physical therapist provides between min and max assist for left  lower extremity propulsion.  Physical therapist also provides cues for weight shift to the right side as well as for adequate step length on the right to improve elasticity of the left hip with gait.  Ambulatory bouts were measured as below.  Patient's husband provides support by steadying the light gait. -Patient did have good success with offloading the left lower extremity by adjusting the leg gait straps to increase weightbearing on the right lower extremity for improving her swing phase.  Did this on the third repetition of walking and had improved success with patient's independence with swing phase of gait.  On the last  round of doing light gait patient required max assist for left lower extremity propulsion likely secondary to fatigue in the left lower extremity from prolonged ambulation. 17 ft 11 ft 37 ft- when straps adjusted as above.  12 ft      PATIENT EDUCATION:  Education details: Pt educated throughout session about proper posture and technique with exercises. Improved exercise technique, movement at target joints, use of target muscles after min to mod verbal, visual, tactile cues. Plan  Person educated: Patient Education method: Explanation Education comprehension: verbalized understanding   HOME EXERCISE PROGRAM: Access Code: [email or web address] URL: [email or web address] Date: 09/28/2022 Prepared by: Thresa Ross  Exercises - Seated March  - 1 x daily - 7 x weekly - 2 sets - 10 reps - Seated Long Arc Quad  - 1 x daily - 7 x weekly - 2 sets - 10 reps - Seated Heel Raise  - 1 x daily - 7 x weekly - 2 sets - 20 reps - Seated hip abduction with band  - 1 x daily - 7 x weekly - 2 sets - 10 reps - Seated Hip Adduction Isometrics with Ball  - 1 x daily - 7 x weekly - 2 sets - 10 reps  ASSESSMENT:  CLINICAL IMPRESSION:  Patient presents with good motivation for completion of physical therapy activities.  Patient continues with light gait training in her initial  utilization of her KAFO and AFO devices on her left and right legs respectively.  Patient does have a lot of difficulty with swing phase of the left lower extremity likely due to increased weight to the KAFO causes as well as the friction with the new shoes of the KAFOs are gone to has with the floor makes it increased difficulty with the initial swing phase of gait.   Patient provided with various cues throughout the session to increase the ease of lower extremity movement but will require significant additional ambulatory training and functional strengthening to increase the ease of ambulation to make this more of a functional task of patient can perform transfers and ambulation short distances without significant assistance from equipment or from physical therapist or caregivers.next physical therapy session as patient's last approved visit with her insurance limitations.  Following this patient will be referred to the [facility] clinic at [facility] for further physical therapy treatment.  Pt will continue to benefit from skilled physical therapy intervention to address impairments, improve QOL, and attain therapy goals.     OBJECTIVE IMPAIRMENTS Abnormal gait, decreased activity tolerance, decreased balance, decreased endurance, decreased mobility, difficulty walking, decreased ROM, decreased strength, impaired perceived functional ability, and impaired sensation.   ACTIVITY LIMITATIONS carrying, lifting, bending, standing, squatting, stairs, transfers, bed mobility, bathing, toileting, dressing, self feeding, hygiene/grooming, and locomotion level  PARTICIPATION LIMITATIONS: meal prep, cleaning, laundry, driving, shopping, community activity, occupation, and yard work  PERSONAL FACTORS 3+ comorbidities: Polyneuropathy, hypertension, hyperlipidemia, diabetes mellitus, arthritis  are also affecting patient's functional outcome.   REHAB POTENTIAL: Fair secondary to comorbidities above particularly polyneuropathy  affecting her lower extremity strength and muscle activation  CLINICAL DECISION MAKING: Evolving/moderate complexity  EVALUATION COMPLEXITY: Moderate   GOALS: Goals reviewed with patient? Yes  SHORT TERM GOALS: Target date: 10/26/2022     Patient will be independent in home exercise program to improve strength/mobility for better functional independence with ADLs. Baseline: No HEP currently 11/15: Completing Hep regularly.  Goal status: Met    LONG TERM GOALS: Target date: 06/17/2023      Patient will be modified  independent for slide board transfers in order to improve her ability to transfer to and from wheelchair as well as bedside commode for improvement in ADLs. Baseline: Patient currently requires assistance with slide board transfers is unable to utilize bedside commode and requires use of catheter and bedpan 11/15: Pt completing slide board transfers and only requires assistance with placing slide board and maneuvering WC 1/3: Patient able to demonstrate modified independent slide board transfer from mat table to wheelchair demonstrating ability to place slide board appropriately and perform transfer with only assistance needed for chair positioning prior to transfer Goal status: MET  2.  Patient will perform sit to and from stand with min assist from physical therapist in order to indicate improved lower extremity functional strength and improved ability to perform functional transfers and functional leg activities within her home. Baseline: Patient unable to stand independently at this time due to lower extremity weakness 11/04/2022: Patient is able to perform anterior lean and pull on parallel bars and activate gluteal muscles was unable to lift weight from wheelchair 4/4: Pt able to stand with 2 x Min A from PT and from caregiver using standard walker  Goal status: IN PROGRESS  3.  Patient will improve focus on therapeutic outcome score to 35 or greater in order to indicate  improved perceived functional ability Baseline: 19 11/15: 40 12/22/22: 43 Goal status: MET  4.  Patient will perform stand pivot transfer from wheelchair to mat table with moderate assistance or less in order to improve her ability to perform transfers within the home to improve her overall functional capacity. Baseline: Unable to complete 11/04/2022: Patient is able to perform anterior lean and pull on parallel bars and activate gluteal muscles was unable to lift weight from wheelchair 12/22/22: Able to stand in // bars with B UE support and with 2 x Mod-Max A from PT and with PT stabilizing LE at knee.  03/25/23: Unable to attend at this time secondary to fatigue and lower extremities and unable to get to standing position following 3 stands earlier in session.  Continue to practice in future sessions with patient is less fatigued Goal status: IN PROGRESS  4.  Pt will improve LEFS by 10 points or more in order to indicate improved LE function and mobility.  Baseline: 9 : 12/22/22: 26 Goal status: MET  5.  Patient will demonstrate ability to stand from elevated mat table with x 1 mod to max assist at rolling walker in order to show progress with stand pivot transfer with caregiver to bedside commode.  Baseline: 1/3: able to stand in // bars with x 2 mod-max A consistently 03/25/23: Able to stand with 2 times min assist on 2 attempts this session but still is unable to consistently stand with x 1 mod to max assist with standard Goal status: IN PROGRESS   6.  Patient will improve LEFS score by 10 points or more in order to indicate improved lower extremity function and mobility  Baseline: 12/23/22: 26    03/25/23:31/80  Goal status: Ongoing    PLAN: PT FREQUENCY: 2x/week  PT DURATION: 12 weeks  PLANNED INTERVENTIONS: Therapeutic exercises, Therapeutic activity, Neuromuscular re-education, Balance training, Gait training, Patient/Family education, Self Care, Joint mobilization, Stair training, DME  instructions, and Electrical stimulation  PLAN FOR NEXT SESSION:  Nustep, standing in // bars as able, LE strength, supine strengthening potentially, sit to stand transfers from elevated mat table/ plinth using RW, ensure patient feels safe with significant mitigation of fall  risk during all standing activities, lite gait with AFOs  5:03 PM, 05/06/23 Norman Herrlich PT ,DPT Physical Therapist- [facility]      05/06/23, 5:03 PM

## 2023-05-06 NOTE — Telephone Encounter (Signed)
Patient and pharmacy notified

## 2023-05-09 ENCOUNTER — Encounter: Payer: Self-pay | Admitting: Student in an Organized Health Care Education/Training Program

## 2023-05-09 ENCOUNTER — Other Ambulatory Visit: Payer: Self-pay | Admitting: Family Medicine

## 2023-05-09 DIAGNOSIS — E1140 Type 2 diabetes mellitus with diabetic neuropathy, unspecified: Secondary | ICD-10-CM

## 2023-05-11 ENCOUNTER — Other Ambulatory Visit: Payer: Self-pay | Admitting: Family Medicine

## 2023-05-11 ENCOUNTER — Encounter: Payer: BLUE CROSS/BLUE SHIELD | Admitting: Physical Therapy

## 2023-05-11 DIAGNOSIS — F5101 Primary insomnia: Secondary | ICD-10-CM

## 2023-05-11 NOTE — Telephone Encounter (Signed)
Requested Prescriptions  Pending Prescriptions Disp Refills   traZODone (DESYREL) 50 MG tablet [Pharmacy Med Name: TRAZODONE 50MG  TABLETS] 180 tablet 0    Sig: TAKE 2 TABLETS(100 MG) BY MOUTH AT BEDTIME     Psychiatry: Antidepressants - Serotonin Modulator Passed - 05/11/2023  3:11 AM      Passed - Completed PHQ-2 or PHQ-9 in the last 360 days      Passed - Valid encounter within last 6 months    Recent Outpatient Visits           2 months ago Type 2 diabetes mellitus with diabetic neuropathy, without long-term current use of insulin [facility])   [facility] Merita Norton T, FNP   3 months ago [facility]   [facility] Alfredia Ferguson, PA-C   5 months ago Type 2 diabetes mellitus with diabetic neuropathy, without long-term current use of insulin [facility])   [facility] Merita Norton T, FNP   8 months ago Type 2 diabetes mellitus with diabetic neuropathy, without long-term current use of insulin [facility])   [facility] Jacky Kindle, FNP   11 months ago Fall, subsequent encounter   [facility] Jacky Kindle, FNP       Future Appointments             In 1 month Suzie Portela, Daryl Eastern, FNP [facility]   In 4 months [place], [facility]

## 2023-05-13 ENCOUNTER — Ambulatory Visit: Payer: BC Managed Care – PPO | Admitting: Physical Therapy

## 2023-05-13 ENCOUNTER — Encounter: Payer: Self-pay | Admitting: Physical Therapy

## 2023-05-13 DIAGNOSIS — R262 Difficulty in walking, not elsewhere classified: Secondary | ICD-10-CM | POA: Diagnosis not present

## 2023-05-13 DIAGNOSIS — S72351D Displaced comminuted fracture of shaft of right femur, subsequent encounter for closed fracture with routine healing: Secondary | ICD-10-CM | POA: Diagnosis not present

## 2023-05-13 DIAGNOSIS — R2689 Other abnormalities of gait and mobility: Secondary | ICD-10-CM | POA: Diagnosis not present

## 2023-05-13 DIAGNOSIS — S7291XD Unspecified fracture of right femur, subsequent encounter for closed fracture with routine healing: Secondary | ICD-10-CM | POA: Diagnosis not present

## 2023-05-13 DIAGNOSIS — X58XXXD Exposure to other specified factors, subsequent encounter: Secondary | ICD-10-CM | POA: Diagnosis not present

## 2023-05-13 DIAGNOSIS — M6281 Muscle weakness (generalized): Secondary | ICD-10-CM | POA: Diagnosis not present

## 2023-05-13 DIAGNOSIS — S72401D Unspecified fracture of lower end of right femur, subsequent encounter for closed fracture with routine healing: Secondary | ICD-10-CM | POA: Diagnosis not present

## 2023-05-13 NOTE — Therapy (Signed)
OUTPATIENT PHYSICAL THERAPY LOWER EXTREMITY TREATMENT/discharge therapy     Patient Name: Gabriella Marsh MRN: [id number] DOB:1966-11-17, 57 y.o., female Today's Date: 05/13/2023   PT End of Session - 05/13/23 1653     Visit Number 35    Number of Visits 42    Date for PT Re-Evaluation 06/17/23    Authorization Type BCBS Other    Authorization - Visit Number 18    Authorization - Number of Visits 18    Progress Note Due on Visit 40    PT Start Time 1603    PT Stop Time 1648    PT Time Calculation (min) 45 min    Equipment Utilized During Treatment Gait belt    Activity Tolerance Patient tolerated treatment well;No increased pain;Patient limited by fatigue    Behavior During Therapy [facility] for tasks assessed/performed                   Past Medical History:  Diagnosis Date   Arthritis    knees   Blood clotting disorder (HCC)    on toe    Degenerative disc disease, lumbar    Diabetes mellitus without complication (HCC)    GERD (gastroesophageal reflux disease)    Headache    daily - AM (has not been able to have SPG blocks lately)   Hyperlipidemia    Hypertension    Neuropathy    feet   Vertigo    3-4x/yr   Past Surgical History:  Procedure Laterality Date   ABDOMINAL HYSTERECTOMY     But still has cervix   AMPUTATION TOE Right 07/24/2021   Procedure: AMPUTATION TOE;  Surgeon: Linus Galas, DPM;  Location: [facility] ORS;  Service: Podiatry;  Laterality: Right;   CESAREAN SECTION     CHOLECYSTECTOMY     COLONOSCOPY WITH PROPOFOL N/A 02/20/2022   Procedure: COLONOSCOPY WITH PROPOFOL;  Surgeon: Wyline Mood, MD;  Location: [facility] ENDOSCOPY;  Service: Gastroenterology;  Laterality: N/A;   left arm frature  10/12/2020   OVARIAN CYST SURGERY     PARATHYROIDECTOMY  03/28/2021   Procedure: PARATHYROIDECTOMY AUTOTRANSPLANT;  Surgeon: Duanne Guess, MD;  Location: [facility] ORS;  Service: General;;   right femur surgery Right    SHOULDER SURGERY Left 12/26/014   Dr.  Elfredia Nevins, [facility]   THYROIDECTOMY N/A 03/28/2021   Procedure: THYROIDECTOMY, total;  Surgeon: Duanne Guess, MD;  Location: [facility] ORS;  Service: General;  Laterality: N/A;  Provider requesting 3 hours /180 minutes for procedure   TONSILLECTOMY AND ADENOIDECTOMY     UTERINE FIBROID SURGERY     Patient Active Problem List   Diagnosis Date Noted   Seborrheic keratoses, inflamed 03/10/2023   Breast screening declined 03/10/2023   Pap smear of cervix declined 03/10/2023   Wheelchair dependence 03/10/2023   Hypertension associated with diabetes (HCC) 03/10/2023   Sepsis secondary to UTI (HCC) 01/07/2023   AKI (acute kidney injury) (HCC) 01/07/2023   Hypokalemia 01/07/2023   Neurogenic bladder 01/07/2023   FTT (failure to thrive) in adult 12/09/2022   Asthenia 12/09/2022   Need for influenza vaccination 09/10/2022   Primary insomnia 09/10/2022   Impaired functional mobility, balance, gait, and endurance 06/09/2022   Type 2 diabetes mellitus with diabetic neuropathy, without long-term current use of insulin (HCC) 04/24/2022   Hypothyroidism 04/24/2022   Hyperlipidemia associated with type 2 diabetes mellitus (HCC) 04/24/2022   Fall 04/24/2022   Dizziness 04/24/2022   Iron deficiency anemia 04/24/2022   Colon cancer screening 01/26/2022   Chronic sensorimotor  polyneuropathy with axonal and demyelinating features 01/12/2022   Osteoarthritis of knees (Bilateral) 01/12/2022   Tricompartment osteoarthritis of knees (Bilateral) 01/12/2022    Grade 1 Retrolisthesis (9mm) of L5 over S1 01/12/2022   History of proximal humerus fracture (Left) 01/12/2022   Vitamin D deficiency 01/12/2022   Hypochloremia 01/12/2022   Chronic peripheral neuropathic pain 01/12/2022   Type 2 diabetes mellitus with peripheral neuropathy (HCC) 01/12/2022   Glands swollen 01/09/2022   Acute cough 01/09/2022   Rhinorrhea 01/09/2022   Left acute otitis media 01/09/2022   Pharmacologic therapy 11/12/2021   Disorder  of skeletal system 11/12/2021   Problems influencing health status 11/12/2021   Chronic hand pain (1ry area of Pain) (Bilateral) (L>R) 11/12/2021   Chronic feet pain (3ry area of Pain) (Bilateral) (L>R) 11/12/2021   Chronic shoulder pain (4th area of Pain) (Left) 11/12/2021   Chronic knee pain (5th area of Pain) (Bilateral) (L>R) 11/12/2021   Lumbar facet syndrome (Bilateral) 11/12/2021   Muscle spasm 10/22/2021   Type 2 diabetes mellitus with microalbuminuria, without long-term current use of insulin (HCC) 10/22/2021   Diabetic foot infection (HCC) 07/22/2021   Depression with anxiety 07/22/2021   Sepsis (HCC) 07/22/2021   COVID-19 virus infection 07/22/2021   Hypocalcemia 07/17/2021   Chronic pain syndrome 07/17/2021   S/P total thyroidectomy 03/28/2021   Thyroid nodule    Schamberg's purpura 10/09/2019   Leg swelling 10/09/2019   Benign positional vertigo 02/03/2016   Common migraine with intractable migraine 06/18/2015   Anxiety 05/04/2015   Adaptation reaction 05/03/2015   DDD (degenerative disc disease), lumbosacral 05/03/2015   Insomnia due to medical condition 05/03/2015   Adiposity 05/03/2015   Disorder of peripheral nervous system 05/03/2015   Snores 05/03/2015   Diabetic peripheral neuropathy (HCC) 03/06/2015   Elevated liver function tests 03/06/2015   Obesity 03/06/2015   GERD (gastroesophageal reflux disease) 03/06/2015   Multinodular goiter 03/06/2015   RLS (restless legs syndrome) 03/06/2015   Chronic low back pain (2ry area of Pain) (Bilateral) (L>R) w/o sciatica 03/06/2015   Leg cramps 03/06/2015    PCP: Jacky Kindle, FNP  REFERRING PROVIDER: Jacky Kindle, FNP  REFERRING DIAG: [id number] (ICD-10-CM) - Fall, subsequent encounter  THERAPY DIAG:  Difficulty in walking, not elsewhere classified  Other abnormalities of gait and mobility  Muscle weakness (generalized)  Rationale for Evaluation and Treatment Rehabilitation  ONSET DATE:  06/18/22  SUBJECTIVE:   SUBJECTIVE STATEMENT:  Patient presents with new order for physical therapy but physical therapist explains that her visit limit has been reached and the new order will not add any visits per her insurance to the best of physical therapist knowledge.  Physical therapist does provide patient with referral to [facility] clinic and this was sent to patient's medical doctor for signature following her visit.  PERTINENT HISTORY:  Pt reports her fall initially occurred in the evening when her husband was not home. Pt got up and had her Rolator and she walked toward her hallway. When she turned around to turn the light on she went completely down on her R leg and broke her femur distally and twisted her knee. When EMT arrived she had to straighten her LE to immobilize it. Pt went to [facility] hospital for the trauma. Upon arrival she was evaluated and had to get MRI and imaging. Progressed to 06/19/22. Pt had surgery that night. Pt had to have plate and screws in her knee and rod in her femur. Pt was in hospital for 11 days  then progressed to [facility] for 30 days. Upon coming home with [facility] pt came home with catheter.  Pt husband helps with everything doing "everything" for her except she helps with cooking. Pt considered home health physical therapy but the cost of care would be too high ( $125 per visit) and this. Prior to injury pt was ambulating with rolator. Pt used bedside commode at that time. Pt reports 6 falls during this year. Pt reports 2-3 falls with rollator and 2-3 falls walking between bathroom and bedroom and 1 fall in bedroom. Pt had another fall at [employer] where she broke her left humerus about 2 years ago.    PAIN:  Are you having pain? No   PRECAUTIONS: Other: WBAT   WEIGHT BEARING RESTRICTIONS     Ashley Mariner, MD MPH - 09/03/2022 3:15 PM EDT   Formatting of this note might be different from the original. At this time, you can continue  weightbearing as tolerated to right lower extremity. Please continue working on range of motion and strengthening. We have given you prescription for physical therapy.   FALLS:  Has patient fallen in last 6 months? Yes. Number of falls 6  LIVING ENVIRONMENT: Lives with: lives with their family and lives with their spouse Lives in: House/apartment Stairs: No Has following equipment at home: Environmental consultant - 2 wheeled, Wheelchair (manual), Shower bench, bed side commode, Grab bars, Ramped entry, and standard walker ( no wheels)  PLOF: Requires assistive device for independence, Needs assistance with ADLs, Needs assistance with homemaking, and Needs assistance with gait  PATIENT GOALS Pt would like to get legs moving a little bit more. Pt wants to be able to get to bathroom on her own.  Get upper body strength improved.      TODAY'S TREATMENT: 05/13/23 Therapeutic activities for all of the below as they were focused on improving patient's ambulatory capacity.  Slide board transfer from wheelchair to mat table With all transfers patient has mod assist x 2 and standard walker for upper extremity support  Treatment today focused on patient's sit to stand transition as well as locking left knee KAFO for functional transfers.  Patient had 2 attempts at stand pivot transfer on first attempt patient has difficulty with lower extremity weightbearing and has to sit appropriately.  With 2 times contact-guard assist physical therapist was able to assist patient with maintaining safe seated position following stand.  For stand patient required min assist x 2 in order to safely come to standing and utilized standard walker throughout for upper extremity support.  Patient was able to make a couple small walker turns and pivots but was unable to stay up long enough to get to complete stand pivot transfer.  Following brief seated rest break patient completes another attempt patient again requires min assist x 2 to get  to standing but does not require assistance with locking left KAFO and she able to do it independently.  Following this patient makes 1 large pivot with walker pivoting on left lower extremity.  Following this patient is fatigued and makes a couple small pivots prior to needing to sit.  Patient did attempt to sit while left knee KAFO is still locked and her husband assist with unlocking knee brace while physical therapist ensures patient comes to seated position safely.  Patient nearly makes it to the chair but part of her right gluteal is on her wheelchair armrest of physical therapist provides mod assist to shift patient's  hips completely into wheelchair.  Following another seated rest break patient completes 2 more to stand transfers with mod assist x 2 with key focus on locking knee when coming to standing position with KAFO as well as allowing physical therapist to unlock knee while patient remained steady and then coming to seated position.  Patient requires more mod assist as repetitions progress in her lower extremities fatigue and mod assist is still x 2.  Patient and caregiver provided with education regarding ways to continue to progress at home as well as education regarding safe use of KAFO for transfers.  Patient caregiver verbalized understanding   PATIENT EDUCATION:  Education details: Pt educated throughout session about proper posture and technique with exercises. Improved exercise technique, movement at target joints, use of target muscles after min to mod verbal, visual, tactile cues. Plan  Person educated: Patient Education method: Explanation Education comprehension: verbalized understanding   HOME EXERCISE PROGRAM: Access Code: [email or web address] URL: [email or web address] Date: 09/28/2022 Prepared by: Thresa Ross  Exercises - Seated March  - 1 x daily - 7 x weekly - 2 sets - 10 reps - Seated Long Arc Quad  - 1 x daily - 7 x weekly - 2 sets - 10 reps - Seated  Heel Raise  - 1 x daily - 7 x weekly - 2 sets - 20 reps - Seated hip abduction with band  - 1 x daily - 7 x weekly - 2 sets - 10 reps - Seated Hip Adduction Isometrics with Ball  - 1 x daily - 7 x weekly - 2 sets - 10 reps  ASSESSMENT:  CLINICAL IMPRESSION:  Patient presents to physical therapy for discharge note this session.  Patient has made significant progress throughout her physical therapy interventions but her insurance visit limit has been met at this time.  Patient will be provided with referral to [facility] clinic this has been sent and faxed to her physician for signature when it is return to clinic we will send it to the [facility] clinic for future scheduling of patient.  Patient is very motivated to complete all activities and therapy but is very hesitant to come to standing due to significant fear of falling.  Patient still requires x 2 assistance for transfers mostly due to her fear of falling but with the assistance she requires to come to standing it could be done with +1 assistance only.  Patient will be discharged from physical therapy clinic at this time but she will benefit from further physical therapy to continue to improve her lower extremity strength, improve her related to transfer, and improve her overall quality of life and independence.   OBJECTIVE IMPAIRMENTS Abnormal gait, decreased activity tolerance, decreased balance, decreased endurance, decreased mobility, difficulty walking, decreased ROM, decreased strength, impaired perceived functional ability, and impaired sensation.   ACTIVITY LIMITATIONS carrying, lifting, bending, standing, squatting, stairs, transfers, bed mobility, bathing, toileting, dressing, self feeding, hygiene/grooming, and locomotion level  PARTICIPATION LIMITATIONS: meal prep, cleaning, laundry, driving, shopping, community activity, occupation, and yard work  PERSONAL FACTORS 3+ comorbidities: Polyneuropathy, hypertension, hyperlipidemia, diabetes  mellitus, arthritis  are also affecting patient's functional outcome.   REHAB POTENTIAL: Fair secondary to comorbidities above particularly polyneuropathy affecting her lower extremity strength and muscle activation  CLINICAL DECISION MAKING: Evolving/moderate complexity  EVALUATION COMPLEXITY: Moderate   GOALS: Goals reviewed with patient? Yes  SHORT TERM GOALS: Target date: 10/26/2022     Patient will be independent in home exercise program to improve strength/mobility for better functional independence  with ADLs. Baseline: No HEP currently 11/15: Completing Hep regularly.  Goal status: Met    LONG TERM GOALS: Target date: 06/17/2023      Patient will be modified independent for slide board transfers in order to improve her ability to transfer to and from wheelchair as well as bedside commode for improvement in ADLs. Baseline: Patient currently requires assistance with slide board transfers is unable to utilize bedside commode and requires use of catheter and bedpan 11/15: Pt completing slide board transfers and only requires assistance with placing slide board and maneuvering WC 1/3: Patient able to demonstrate modified independent slide board transfer from mat table to wheelchair demonstrating ability to place slide board appropriately and perform transfer with only assistance needed for chair positioning prior to transfer Goal status: MET  2.  Patient will perform sit to and from stand with min assist from physical therapist in order to indicate improved lower extremity functional strength and improved ability to perform functional transfers and functional leg activities within her home. Baseline: Patient unable to stand independently at this time due to lower extremity weakness 11/04/2022: Patient is able to perform anterior lean and pull on parallel bars and activate gluteal muscles was unable to lift weight from wheelchair 4/4: Pt able to stand with 2 x Min A from PT and from  caregiver using standard walker 5/24: Pt performs with min A x 2  Goal status: MET  3.  Patient will improve focus on therapeutic outcome score to 35 or greater in order to indicate improved perceived functional ability Baseline: 19 11/15: 40 12/22/22: 43 Goal status: MET  4.  Patient will perform stand pivot transfer from wheelchair to mat table with moderate assistance or less in order to improve her ability to perform transfers within the home to improve her overall functional capacity. Baseline: Unable to complete 11/04/2022: Patient is able to perform anterior lean and pull on parallel bars and activate gluteal muscles was unable to lift weight from wheelchair 12/22/22: Able to stand in // bars with B UE support and with 2 x Mod-Max A from PT and with PT stabilizing LE at knee.  03/25/23: Unable to attend at this time secondary to fatigue and lower extremities and unable to get to standing position following 3 stands earlier in session.  Continue to practice in future sessions with patient is less fatigued 5/24: Patient able to nearly perform stand pivot transfer but her lower extremities fatigue prior to getting Square with chair and requires min assist for her hips to get in proper position to sit squarely in wheelchair. Goal status: NOT MET  4.  Pt will improve LEFS by 10 points or more in order to indicate improved LE function and mobility.  Baseline: 9 : 12/22/22: 26 Goal status: MET  5.  Patient will demonstrate ability to stand from elevated mat table with x 1 mod to max assist at rolling walker in order to show progress with stand pivot transfer with caregiver to bedside commode.  Baseline: 1/3: able to stand in // bars with x 2 mod-max A consistently 03/25/23: Able to stand with 2 times min assist on 2 attempts this session but still is unable to consistently stand with x 1 mod to max assist with standard 5/24: Goal status: NOT MET   6.  Patient will improve LEFS score by 10 points or more in  order to indicate improved lower extremity function and mobility  Baseline: 12/23/22: 26    03/25/23:31/80  Goal status:  Not met   PLAN: PT FREQUENCY: 2x/week  PT DURATION: 12 weeks  PLANNED INTERVENTIONS: Therapeutic exercises, Therapeutic activity, Neuromuscular re-education, Balance training, Gait training, Patient/Family education, Self Care, Joint mobilization, Stair training, DME instructions, and Electrical stimulation  PLAN FOR NEXT SESSION:  [facility] visit limit met 4:54 PM, 05/13/23 Norman Herrlich PT ,DPT Physical Therapist- [facility]      05/13/23, 4:54 PM

## 2023-05-14 ENCOUNTER — Encounter: Payer: Self-pay | Admitting: Physical Therapy

## 2023-05-18 ENCOUNTER — Encounter: Payer: BLUE CROSS/BLUE SHIELD | Admitting: Physical Therapy

## 2023-05-20 ENCOUNTER — Ambulatory Visit: Payer: BC Managed Care – PPO | Admitting: Physical Therapy

## 2023-05-21 DIAGNOSIS — R2689 Other abnormalities of gait and mobility: Secondary | ICD-10-CM | POA: Diagnosis not present

## 2023-05-21 DIAGNOSIS — I10 Essential (primary) hypertension: Secondary | ICD-10-CM | POA: Diagnosis not present

## 2023-05-25 ENCOUNTER — Encounter: Payer: BLUE CROSS/BLUE SHIELD | Admitting: Physical Therapy

## 2023-05-27 ENCOUNTER — Ambulatory Visit: Payer: BC Managed Care – PPO | Admitting: Physical Therapy

## 2023-06-01 ENCOUNTER — Encounter: Payer: BLUE CROSS/BLUE SHIELD | Admitting: Physical Therapy

## 2023-06-03 ENCOUNTER — Ambulatory Visit: Payer: BC Managed Care – PPO | Admitting: Physical Therapy

## 2023-06-07 ENCOUNTER — Encounter: Payer: Self-pay | Admitting: Family Medicine

## 2023-06-08 ENCOUNTER — Encounter: Payer: BLUE CROSS/BLUE SHIELD | Admitting: Physical Therapy

## 2023-06-10 ENCOUNTER — Ambulatory Visit: Payer: BC Managed Care – PPO | Admitting: Physical Therapy

## 2023-06-11 ENCOUNTER — Ambulatory Visit: Payer: BC Managed Care – PPO | Admitting: Family Medicine

## 2023-06-20 DIAGNOSIS — R2689 Other abnormalities of gait and mobility: Secondary | ICD-10-CM | POA: Diagnosis not present

## 2023-06-20 DIAGNOSIS — I10 Essential (primary) hypertension: Secondary | ICD-10-CM | POA: Diagnosis not present

## 2023-06-28 ENCOUNTER — Ambulatory Visit: Payer: BC Managed Care – PPO | Admitting: Family Medicine

## 2023-06-29 ENCOUNTER — Ambulatory Visit: Payer: BC Managed Care – PPO | Admitting: Family Medicine

## 2023-07-06 ENCOUNTER — Encounter: Payer: Self-pay | Admitting: Student in an Organized Health Care Education/Training Program

## 2023-07-06 ENCOUNTER — Encounter: Payer: Self-pay | Admitting: Family Medicine

## 2023-07-06 ENCOUNTER — Ambulatory Visit
Payer: BC Managed Care – PPO | Attending: Student in an Organized Health Care Education/Training Program | Admitting: Student in an Organized Health Care Education/Training Program

## 2023-07-06 ENCOUNTER — Ambulatory Visit (INDEPENDENT_AMBULATORY_CARE_PROVIDER_SITE_OTHER): Payer: BC Managed Care – PPO | Admitting: Family Medicine

## 2023-07-06 VITALS — BP 121/81 | HR 93 | Ht 70.0 in | Wt 220.0 lb

## 2023-07-06 VITALS — BP 123/78 | HR 91 | Temp 97.4°F | Resp 16 | Ht 70.0 in | Wt 225.0 lb

## 2023-07-06 DIAGNOSIS — M79642 Pain in left hand: Secondary | ICD-10-CM | POA: Diagnosis not present

## 2023-07-06 DIAGNOSIS — E1140 Type 2 diabetes mellitus with diabetic neuropathy, unspecified: Secondary | ICD-10-CM

## 2023-07-06 DIAGNOSIS — Z8781 Personal history of (healed) traumatic fracture: Secondary | ICD-10-CM | POA: Diagnosis present

## 2023-07-06 DIAGNOSIS — M4310 Spondylolisthesis, site unspecified: Secondary | ICD-10-CM | POA: Insufficient documentation

## 2023-07-06 DIAGNOSIS — Z79899 Other long term (current) drug therapy: Secondary | ICD-10-CM | POA: Insufficient documentation

## 2023-07-06 DIAGNOSIS — E039 Hypothyroidism, unspecified: Secondary | ICD-10-CM

## 2023-07-06 DIAGNOSIS — E1169 Type 2 diabetes mellitus with other specified complication: Secondary | ICD-10-CM

## 2023-07-06 DIAGNOSIS — M4316 Spondylolisthesis, lumbar region: Secondary | ICD-10-CM

## 2023-07-06 DIAGNOSIS — I152 Hypertension secondary to endocrine disorders: Secondary | ICD-10-CM | POA: Diagnosis not present

## 2023-07-06 DIAGNOSIS — E1159 Type 2 diabetes mellitus with other circulatory complications: Secondary | ICD-10-CM | POA: Diagnosis not present

## 2023-07-06 DIAGNOSIS — M5137 Other intervertebral disc degeneration, lumbosacral region: Secondary | ICD-10-CM | POA: Diagnosis not present

## 2023-07-06 DIAGNOSIS — M79672 Pain in left foot: Secondary | ICD-10-CM | POA: Diagnosis not present

## 2023-07-06 DIAGNOSIS — G608 Other hereditary and idiopathic neuropathies: Secondary | ICD-10-CM

## 2023-07-06 DIAGNOSIS — Z79891 Long term (current) use of opiate analgesic: Secondary | ICD-10-CM | POA: Diagnosis not present

## 2023-07-06 DIAGNOSIS — G8929 Other chronic pain: Secondary | ICD-10-CM | POA: Diagnosis not present

## 2023-07-06 DIAGNOSIS — G894 Chronic pain syndrome: Secondary | ICD-10-CM | POA: Diagnosis not present

## 2023-07-06 DIAGNOSIS — M79641 Pain in right hand: Secondary | ICD-10-CM | POA: Diagnosis not present

## 2023-07-06 DIAGNOSIS — M25512 Pain in left shoulder: Secondary | ICD-10-CM | POA: Diagnosis not present

## 2023-07-06 DIAGNOSIS — M79671 Pain in right foot: Secondary | ICD-10-CM | POA: Diagnosis not present

## 2023-07-06 DIAGNOSIS — M25561 Pain in right knee: Secondary | ICD-10-CM | POA: Insufficient documentation

## 2023-07-06 DIAGNOSIS — M25562 Pain in left knee: Secondary | ICD-10-CM | POA: Diagnosis not present

## 2023-07-06 DIAGNOSIS — M792 Neuralgia and neuritis, unspecified: Secondary | ICD-10-CM | POA: Insufficient documentation

## 2023-07-06 DIAGNOSIS — M170 Bilateral primary osteoarthritis of knee: Secondary | ICD-10-CM | POA: Diagnosis not present

## 2023-07-06 DIAGNOSIS — Z794 Long term (current) use of insulin: Secondary | ICD-10-CM

## 2023-07-06 DIAGNOSIS — E1142 Type 2 diabetes mellitus with diabetic polyneuropathy: Secondary | ICD-10-CM | POA: Diagnosis not present

## 2023-07-06 DIAGNOSIS — M545 Low back pain, unspecified: Secondary | ICD-10-CM | POA: Diagnosis not present

## 2023-07-06 DIAGNOSIS — E785 Hyperlipidemia, unspecified: Secondary | ICD-10-CM

## 2023-07-06 DIAGNOSIS — Z7985 Long-term (current) use of injectable non-insulin antidiabetic drugs: Secondary | ICD-10-CM

## 2023-07-06 LAB — POCT GLYCOSYLATED HEMOGLOBIN (HGB A1C): Hemoglobin A1C: 9.2 % — AB (ref 4.0–5.6)

## 2023-07-06 MED ORDER — OXYCODONE-ACETAMINOPHEN 5-325 MG PO TABS
1.0000 | ORAL_TABLET | Freq: Three times a day (TID) | ORAL | 0 refills | Status: DC
Start: 2023-08-12 — End: 2023-10-07

## 2023-07-06 MED ORDER — OXYCODONE-ACETAMINOPHEN 5-325 MG PO TABS
1.0000 | ORAL_TABLET | Freq: Three times a day (TID) | ORAL | 0 refills | Status: DC
Start: 2023-09-11 — End: 2023-10-07

## 2023-07-06 MED ORDER — OXYCODONE-ACETAMINOPHEN 5-325 MG PO TABS
1.0000 | ORAL_TABLET | Freq: Three times a day (TID) | ORAL | 0 refills | Status: DC
Start: 2023-07-13 — End: 2023-10-07

## 2023-07-06 NOTE — Assessment & Plan Note (Signed)
Chronic; variable labs previously on 225 mcg  Repeat dose to reassess TSH

## 2023-07-06 NOTE — Assessment & Plan Note (Signed)
Chronic, stable A1c not at goal Goal <8% Has not been taking her insulin; only using ozempic 1 mg Restart 10 units; wishes to defer use of CGM d/t cost concerns

## 2023-07-06 NOTE — Progress Notes (Signed)
PROVIDER NOTE: Information contained herein reflects review and annotations entered in association with encounter. Interpretation of such information and data should be left to medically-trained personnel. Information provided to patient can be located elsewhere in the medical record under "Patient Instructions". Document created using STT-dictation technology, any transcriptional errors that may result from process are unintentional.    Patient: Gabriella Marsh  Service Category: E/M  Provider: Edward Jolly, MD  DOB: [birth year]  DOS: 07/06/2023  Referring Provider: Jacky Kindle, FNP  MRN: [id number]  Specialty: Interventional Pain Management  PCP: Jacky Kindle, FNP  Type: Established Patient  Setting: Ambulatory outpatient    Location: Office  Delivery: Face-to-face     HPI  [name], a 57 y.o. year old female, is here today because of her Diabetic peripheral neuropathy (HCC) [E11.42]. [name] primary complain today is Foot Pain (Bilateral ), Back Pain (Thoracic, between scapulas right side ), and Leg Pain (Bilateral ) Last encounter: My last encounter with her was on 03/23/23 Pertinent problems: [name] has Diabetic peripheral neuropathy (HCC); RLS (restless legs syndrome); Chronic low back pain (2ry area of Pain) (Bilateral) (L>R) w/o sciatica; DDD (degenerative disc disease), lumbosacral; Disorder of peripheral nervous system; Chronic pain syndrome; Pharmacologic therapy; Disorder of skeletal system; Problems influencing health status; Chronic hand pain (1ry area of Pain) (Bilateral) (L>R); Chronic feet pain (3ry area of Pain) (Bilateral) (L>R); Chronic shoulder pain (4th area of Pain) (Left); Chronic knee pain (5th area of Pain) (Bilateral) (L>R); Lumbar facet syndrome (Bilateral); Chronic sensorimotor polyneuropathy with axonal and demyelinating features; Osteoarthritis of knees (Bilateral); Tricompartment osteoarthritis of knees (Bilateral);  Grade 1 Retrolisthesis  (9mm) of L5 over S1; Chronic peripheral neuropathic pain; and Type 2 diabetes mellitus with peripheral neuropathy (HCC) on their pertinent problem list. Pain Assessment: Severity of Chronic pain is reported as a 7 /10. Location: Foot Left, Right/pain travels up and down both legs. Onset: More than a month ago. Quality: Discomfort, Constant, Other (Comment), Dull (feels like muscles that are loose and need to tighten up). Timing: Constant. Modifying factor(s): laying in the bed and stretch legs out.. Vitals:  height is 5\' 10"  (1.778 m) and weight is 225 lb (102.1 kg). Her temporal temperature is 97.4 F (36.3 C) (abnormal). Her blood pressure is 123/78 and her pulse is 91. Her respiration is 16 and oxygen saturation is 99%.   Reason for encounter: both, medication management and post-procedure evaluation and assessment.   -Having increased mid back pain from inferior scapula to mid spine -BP has improved -Patient did receive benefit with Qutenza for LE pain related to PDN, would like to try for her hands however insurance is not covering the procedure   Pharmacotherapy Assessment  Analgesic: Percocet 5 mg TID as needed, quantity 90/month   Monitoring: [place] PMP: PDMP reviewed during this encounter.       Pharmacotherapy: No side-effects or adverse reactions reported. Compliance: No problems identified. Effectiveness: Clinically acceptable.  Vernie Ammons, RN  07/06/2023 11:26 AM  Sign when Signing Visit Nursing Pain Medication Assessment:  Safety precautions to be maintained throughout the outpatient stay will include: orient to surroundings, keep bed in low position, maintain call bell within reach at all times, provide assistance with transfer out of bed and ambulation.  Medication Inspection Compliance: Pill count conducted under aseptic conditions, in front of the patient. Neither the pills nor the bottle was removed from the patient's sight at any time. Once count was completed pills  were immediately returned  to the patient in their original bottle.  Medication: Oxycodone/APAP Pill/Patch Count:  39 of 90 pills remain Pill/Patch Appearance: Markings consistent with prescribed medication Bottle Appearance: Standard pharmacy container. Clearly labeled. Filled Date: 06 / 23 / 2024 Last Medication intake:  Yesterday    UDS:  Summary  Date Value Ref Range Status  11/12/2021 Note  Final    Comment:    ==================================================================== Compliance Drug Analysis, Ur ==================================================================== Test                             Result       Flag       Units  Drug Present and Declared for Prescription Verification   Oxycodone                      1136         EXPECTED   ng/mg creat   Oxymorphone                    1753         EXPECTED   ng/mg creat   Noroxycodone                   1215         EXPECTED   ng/mg creat   Noroxymorphone                 779          EXPECTED   ng/mg creat    Sources of oxycodone are scheduled prescription medications.    Oxymorphone, noroxycodone, and noroxymorphone are expected    metabolites of oxycodone. Oxymorphone is also available as a    scheduled prescription medication.    Gabapentin                     PRESENT      EXPECTED   Cyclobenzaprine                PRESENT      EXPECTED   Desmethylcyclobenzaprine       PRESENT      EXPECTED    Desmethylcyclobenzaprine is an expected metabolite of    cyclobenzaprine.    Nortriptyline                  PRESENT      EXPECTED    Nortriptyline may be administered as a prescription drug; it is also    an expected metabolite of amitriptyline.    Trazodone                      PRESENT      EXPECTED   1,3 chlorophenyl piperazine    PRESENT      EXPECTED    1,3-chlorophenyl piperazine is an expected metabolite of trazodone.    Acetaminophen                  PRESENT       EXPECTED ==================================================================== Test                      Result    Flag   Units      Ref Range   Creatinine              165              mg/dL      >=  20 ==================================================================== Declared Medications:  The flagging and interpretation on this report are based on the  following declared medications.  Unexpected results may arise from  inaccuracies in the declared medications.   **Note: The testing scope of this panel includes these medications:   Cyclobenzaprine (Flexeril)  Gabapentin (Neurontin)  Nortriptyline (Pamelor)  Oxycodone (Percocet)  Trazodone (Desyrel)   **Note: The testing scope of this panel does not include small to  moderate amounts of these reported medications:   Acetaminophen (Percocet)   **Note: The testing scope of this panel does not include the  following reported medications:   Atorvastatin  Clopidogrel  Dulaglutide (Trulicity)  Glipizide (Glucotrol)  Hydrochlorothiazide  Levothyroxine  Lisinopril (Zestril)  Melatonin  Metformin (Glucophage)  Omeprazole  Triamterene ==================================================================== For clinical consultation, please call [phone]. ====================================================================      ROS  Constitutional: Denies any fever or chills Gastrointestinal: No reported hemesis, hematochezia, vomiting, or acute GI distress Musculoskeletal: bilateral hand pand pain Neurological:  Bilateral feet paresthesias  Medication Review  Melatonin, Semaglutide (1 MG/DOSE), acetaminophen, atorvastatin, clopidogrel, cyclobenzaprine, fluticasone, gabapentin, insulin glargine, levothyroxine, lisinopril, metFORMIN, nortriptyline, omeprazole, oxyCODONE-acetaminophen, and traZODone  History Review  Allergy: [name] is allergic to celecoxib, pregabalin, buspar [buspirone], citalopram, and other. Drug:  [name]  reports no history of drug use. Alcohol:  reports no history of alcohol use. Tobacco:  reports that she quit smoking about 11 years ago. Her smoking use included cigarettes. She has never used smokeless tobacco. Social: [name]  reports that she quit smoking about 11 years ago. Her smoking use included cigarettes. She has never used smokeless tobacco. She reports that she does not drink alcohol and does not use drugs. Medical:  has a past medical history of Arthritis, Blood clotting disorder (HCC), Degenerative disc disease, lumbar, Diabetes mellitus without complication (HCC), GERD (gastroesophageal reflux disease), Headache, Hyperlipidemia, Hypertension, Neuropathy, and Vertigo. Surgical: [name]  has a past surgical history that includes Cesarean section; Shoulder surgery (Left, 12/26/014); Abdominal hysterectomy; Tonsillectomy and adenoidectomy; Uterine fibroid surgery; Ovarian cyst surgery; Cholecystectomy; left arm frature (10/12/2020); Thyroidectomy (N/A, 03/28/2021); Parathyroidectomy (03/28/2021); Amputation toe (Right, 07/24/2021); Colonoscopy with propofol (N/A, 02/20/2022); and right femur surgery (Right). Family: family history includes Congestive Heart Failure in her father; Diabetes in her father; Healthy in her brother; Heart disease in her father; Hyperlipidemia in her father; Hypertension in her father; Irritable bowel syndrome in her mother; Osteoporosis in her maternal grandmother and mother.  Laboratory Chemistry Profile   Renal Lab Results  Component Value Date   BUN 7 01/09/2023   CREATININE 0.61 01/09/2023   BCR 21 04/24/2022   GFR 111.18 06/05/2015   GFRAA 92 01/03/2021   GFRNONAA >60 01/09/2023    Hepatic Lab Results  Component Value Date   AST 57 (H) 01/07/2023   ALT 76 (H) 01/07/2023   ALBUMIN 4.0 01/07/2023   ALKPHOS 55 01/07/2023   LIPASE 47 05/04/2015    Electrolytes Lab Results  Component Value Date   NA 136 01/09/2023   K 3.5  01/09/2023   CL 99 01/09/2023   CALCIUM 8.3 (L) 01/09/2023   MG 1.8 01/09/2023    Bone Lab Results  Component Value Date   25OHVITD1 14 (L) 11/12/2021   25OHVITD2 <1.0 11/12/2021   25OHVITD3 13 11/12/2021    Inflammation (CRP: Acute Phase) (ESR: Chronic Phase) Lab Results  Component Value Date   CRP 4 11/12/2021   ESRSEDRATE 39 11/12/2021   LATICACIDVEN 2.9 (HH) 01/07/2023  Note: Above Lab results reviewed.  Recent Imaging Review  US Venous Img Lower Bilateral CLINICAL DATA:  pain  EXAM: BILATERAL LOWER EXTREMITY VENOUS DOPPLER ULTRASOUND  TECHNIQUE: Gray-scale sonography with graded compression, as well as color Doppler and duplex ultrasound were performed to evaluate the lower extremity deep venous systems from the level of the common femoral vein and including the common femoral, femoral, profunda femoral, popliteal and calf veins including the posterior tibial, peroneal and gastrocnemius veins when visible. The superficial great saphenous vein was also interrogated. Spectral Doppler was utilized to evaluate flow at rest and with distal augmentation maneuvers in the common femoral, femoral and popliteal veins.  COMPARISON:  LEFT lower extremity venous duplex, 05/27/2022.  FINDINGS: RIGHT LOWER EXTREMITY  VENOUS  Normal compressibility of the RIGHT common femoral, superficial femoral, and popliteal veins, as well as the visualized calf veins. Visualized portions of profunda femoral vein and great saphenous vein unremarkable. No filling defects to suggest DVT on grayscale or color Doppler imaging. Doppler waveforms show normal direction of venous flow, normal respiratory plasticity and response to augmentation.  OTHER  No evidence of superficial thrombophlebitis or abnormal fluid collection.  Limitations: Patient body habitus  LEFT LOWER EXTREMITY  VENOUS  Normal compressibility of the LEFT common femoral, superficial femoral, and popliteal  veins, as well as the visualized calf veins. Visualized portions of profunda femoral vein and great saphenous vein unremarkable. No filling defects to suggest DVT on grayscale or color Doppler imaging. Doppler waveforms show normal direction of venous flow, normal respiratory plasticity and response to augmentation.  OTHER  No evidence of superficial thrombophlebitis or abnormal fluid collection.  Limitations: Patient body habitus  IMPRESSION: No evidence of femoropopliteal DVT or superficial thrombophlebitis within either lower extremity.  Roanna Banning, MD  [facility]  [facility]  Electronically Signed   By: Roanna Banning M.D.   On: 01/07/2023 12:23 DG Chest Port 1 View CLINICAL DATA:  Hypotension.  EXAM: PORTABLE CHEST 1 VIEW  COMPARISON:  07/22/2021  FINDINGS: The lungs are clear without focal pneumonia, edema, pneumothorax or pleural effusion. The cardiopericardial silhouette is within normal limits for size. The visualized bony structures of the thorax are unremarkable. Telemetry leads overlie the chest.  IMPRESSION: No active disease.  Electronically Signed   By: Kennith Center M.D.   On: 01/07/2023 11:21 Note: Reviewed        Physical Exam  General appearance: Well nourished, well developed, and well hydrated. In no apparent acute distress Mental status: Alert, oriented x 3 (person, place, & time)       Respiratory: No evidence of acute respiratory distress Eyes: PERLA Vitals: BP 123/78 (BP Location: Right Arm, Patient Position: Sitting, Cuff Size: Normal)   Pulse 91   Temp (!) 97.4 F (36.3 C) (Temporal)   Resp 16   Ht 5\' 10"  (1.778 m)   Wt 225 lb (102.1 kg)   SpO2 99%   BMI 32.28 kg/m  BMI: Estimated body mass index is 32.28 kg/m as calculated from the following:   Height as of this encounter: 5\' 10"  (1.778 m).   Weight as of this encounter: 225 lb (102.1 kg). Ideal: Ideal body weight: 68.5 kg  (151 lb 0.2 oz) Adjusted ideal body weight: 81.9 kg (180 lb 9.7 oz)  Patient presents in wheelchair.   Bilateral hand and feet paresthesias   Assessment   Diagnosis Status  1. Diabetic peripheral neuropathy (HCC)   2. Chronic peripheral neuropathic pain   3. Chronic  sensorimotor polyneuropathy with axonal and demyelinating features   4. History of proximal humerus fracture (Left)   5. Type 2 diabetes mellitus with peripheral neuropathy (HCC)   6. Chronic pain syndrome   7. Osteoarthritis of knees (Bilateral)   8. Pharmacologic therapy   9. Chronic shoulder pain (4th area of Pain) (Left)   10.  Grade 1 Retrolisthesis (9mm) of L5 over S1   11. Chronic hand pain (1ry area of Pain) (Bilateral) (L>R)   12. Encounter for medication management   13. Encounter for chronic pain management   14. Chronic use of opiate for therapeutic purpose   15. Chronic knee pain (5th area of Pain) (Bilateral) (L>R)   16. Tricompartment osteoarthritis of knees (Bilateral)   17. Chronic low back pain (2ry area of Pain) (Bilateral) (L>R) w/o sciatica   18. Chronic feet pain (3ry area of Pain) (Bilateral) (L>R)       Worsening Worsening Persistent    Plan of Care    [name] has a current medication list which includes the following long-term medication(s): atorvastatin, fluticasone, gabapentin, gabapentin, lantus solostar, levothyroxine, lisinopril, metformin, nortriptyline, omeprazole, trazodone, [START ON 07/13/2023] oxycodone-acetaminophen, [START ON 08/12/2023] oxycodone-acetaminophen, and [START ON 09/11/2023] oxycodone-acetaminophen.  Pharmacotherapy (Medications Ordered): Meds ordered this encounter  Medications   oxyCODONE-acetaminophen (PERCOCET) 5-325 MG tablet    Sig: Take 1 tablet by mouth every 8 (eight) hours. Must last 30 days.    Dispense:  90 tablet    Refill:  0    DO NOT: delete (not duplicate); no partial-fill (will deny script to complete), no refill request  (F/U required). DISPENSE: 1 day early if closed on fill date. WARN: No CNS-depressants within 8 hrs of med.   oxyCODONE-acetaminophen (PERCOCET) 5-325 MG tablet    Sig: Take 1 tablet by mouth every 8 (eight) hours. Must last 30 days.    Dispense:  90 tablet    Refill:  0    DO NOT: delete (not duplicate); no partial-fill (will deny script to complete), no refill request (F/U required). DISPENSE: 1 day early if closed on fill date. WARN: No CNS-depressants within 8 hrs of med.   oxyCODONE-acetaminophen (PERCOCET) 5-325 MG tablet    Sig: Take 1 tablet by mouth every 8 (eight) hours. Must last 30 days.    Dispense:  90 tablet    Refill:  0    DO NOT: delete (not duplicate); no partial-fill (will deny script to complete), no refill request (F/U required). DISPENSE: 1 day early if closed on fill date. WARN: No CNS-depressants within 8 hrs of med.   Orders Placed This Encounter  Procedures   NEUROLYSIS    Please order Qutenza patches from pharmacy    Standing Status:   Future    Standing Expiration Date:   10/06/2023    Order Specific Question:   Where will this procedure be performed?    Answer:   [facility]   ToxASSURE Select 13 (MW), Urine    Volume: 30 ml(s). Minimum 3 ml of urine is needed. Document temperature of fresh sample. Indications: Long term (current) use of opiate analgesic (Z79.891)    Order Specific Question:   Release to patient    Answer:   Immediate   Creatinine, urine, random   Sodium, urine, random   Potassium, urine, random     Continue multimodal analgesics as prescribed Continue with PT   Follow-up plan:   Return in about 8 days (around 07/14/2023) for Qutenza .  Recent Visits No visits were found meeting these conditions. Showing recent visits within past 90 days and meeting all other requirements Today's Visits Date Type Provider Dept  07/06/23 Office Visit Edward Jolly, MD [facility]  Showing today's visits and meeting all  other requirements Future Appointments No visits were found meeting these conditions. Showing future appointments within next 90 days and meeting all other requirements  I discussed the assessment and treatment plan with the patient. The patient was provided an opportunity to ask questions and all were answered. The patient agreed with the plan and demonstrated an understanding of the instructions.  Patient advised to call back or seek an in-person evaluation if the symptoms or condition worsens.  Duration of encounter: .  Total time on encounter, as per AMA guidelines included both the face-to-face and non-face-to-face time personally spent by the physician and/or other qualified health care professional(s) on the day of the encounter (includes time in activities that require the physician or other qualified health care professional and does not include time in activities normally performed by clinical staff). Physician's time may include the following activities when performed: preparing to see the patient (eg, review of tests, pre-charting review of records) obtaining and/or reviewing separately obtained history performing a medically appropriate examination and/or evaluation counseling and educating the patient/family/caregiver ordering medications, tests, or procedures referring and communicating with other health care professionals (when not separately reported) documenting clinical information in the electronic or other health record independently interpreting results (not separately reported) and communicating results to the patient/ family/caregiver care coordination (not separately reported)  Note by: Edward Jolly, MD Date: 07/06/2023; Time: 2:20 PM

## 2023-07-06 NOTE — Progress Notes (Signed)
Nursing Pain Medication Assessment:  Safety precautions to be maintained throughout the outpatient stay will include: orient to surroundings, keep bed in low position, maintain call bell within reach at all times, provide assistance with transfer out of bed and ambulation.  Medication Inspection Compliance: Pill count conducted under aseptic conditions, in front of the patient. Neither the pills nor the bottle was removed from the patient's sight at any time. Once count was completed pills were immediately returned to the patient in their original bottle.  Medication: Oxycodone/APAP Pill/Patch Count:  39 of 90 pills remain Pill/Patch Appearance: Markings consistent with prescribed medication Bottle Appearance: Standard pharmacy container. Clearly labeled. Filled Date: 06 / 23 / 2024 Last Medication intake:  Yesterday

## 2023-07-06 NOTE — Assessment & Plan Note (Signed)
Chronic, remains on lipitor 20 mg Repeat LP The 10-year ASCVD risk score (Arnett DK, et al., 2019) is: 6.8%

## 2023-07-06 NOTE — Progress Notes (Signed)
Established patient visit   Patient: Gabriella Marsh   DOB: October 13, 1966   57 y.o. Female  MRN: [id number] Visit Date: 07/06/2023  Today's healthcare provider: Jacky Kindle, FNP  Introduced to nurse practitioner role and practice setting.  All questions answered.  Discussed provider/patient relationship and expectations.  Pt in w/c; presents with husband. Husband requests FMLA for intermittent leave given no other caregivers can assist with transportation and cannot afford cost of insurance coverage, noting 130$/drive.  Chief Complaint  Patient presents with   Medical Management of Chronic Issues    Patient is present for 3 month dm II f/u. Patient was last seen on 03/10/23 and advised to restart of baby dose of lisinopril at 2.5 mg, continue lipitor at 20 mg, lantus at 24 u/night, Trulicity at 3 mg/wk and metformin 1000 mg BID.  Patient reports symptoms of fatigue, visual changes, nausea & excessive thirst. Reports checking bp at home (100-120/50-80).    Subjective    HPI HPI     Medical Management of Chronic Issues    Additional comments: Patient is present for 3 month dm II f/u. Patient was last seen on 03/10/23 and advised to restart of baby dose of lisinopril at 2.5 mg, continue lipitor at 20 mg, lantus at 24 u/night, Trulicity at 3 mg/wk and metformin 1000 mg BID.  Patient reports symptoms of fatigue, visual changes, nausea & excessive thirst. Reports checking bp at home (100-120/50-80).       Last edited by Acey Lav, CMA on 07/06/2023 10:02 AM.      Medications: Outpatient Medications Prior to Visit  Medication Sig   atorvastatin (LIPITOR) 20 MG tablet TAKE 1 TABLET(20 MG) BY MOUTH DAILY   clopidogrel (PLAVIX) 75 MG tablet TAKE 1 TABLET(75 MG) BY MOUTH DAILY   cyclobenzaprine (FLEXERIL) 5 MG tablet Take 1 tablet (5 mg total) by mouth 3 (three) times daily as needed for muscle spasms.   fluticasone (FLONASE) 50 MCG/ACT nasal spray Place 2 sprays into both  nostrils daily.   gabapentin (NEURONTIN) 100 MG capsule TAKE 2 CAPSULES THREE TIMES A DAY ALONG WITH 600 MG THREE TIMES A DAY FOR A TOTAL DAILY DOSE OF 2400 MG DAILY   gabapentin (NEURONTIN) 600 MG tablet Take 1 tablet (600 mg total) by mouth 3 (three) times daily.   insulin glargine (LANTUS SOLOSTAR) 100 UNIT/ML Solostar Pen Inject 24 Units into the skin at bedtime.   levothyroxine (SYNTHROID) 200 MCG tablet Take 200 mcg by mouth daily before breakfast.   lisinopril (ZESTRIL) 2.5 MG tablet Take 1 tablet (2.5 mg total) by mouth daily. (Patient taking differently: Take 2.5 mg by mouth daily as needed.)   Melatonin 10 MG TABS Take 10 mg by mouth at bedtime.   metFORMIN (GLUCOPHAGE) 500 MG tablet Take 2 tablets (1,000 mg total) by mouth 2 (two) times daily with a meal.   nortriptyline (PAMELOR) 25 MG capsule Take 2 capsules (50 mg total) by mouth at bedtime.   omeprazole (PRILOSEC) 20 MG capsule TAKE 1 CAPSULE DAILY   Semaglutide, 1 MG/DOSE, 4 MG/3ML SOPN Inject 1 mg as directed once a week.   traZODone (DESYREL) 50 MG tablet TAKE 2 TABLETS(100 MG) BY MOUTH AT BEDTIME   [DISCONTINUED] levothyroxine (SYNTHROID) 25 MCG tablet Take 1 tablet (25 mcg total) by mouth daily. Please take WITH your 200 mcg dose. Recommend labs in 6-8 weeks. (Patient not taking: Reported on 03/23/2023)   [DISCONTINUED] oxyCODONE-acetaminophen (PERCOCET) 5-325 MG tablet Take 1 tablet by  mouth every 8 (eight) hours. Must last 30 days.   [DISCONTINUED] oxyCODONE-acetaminophen (PERCOCET) 5-325 MG tablet Take 1 tablet by mouth every 8 (eight) hours. Must last 30 days.   [DISCONTINUED] oxyCODONE-acetaminophen (PERCOCET) 5-325 MG tablet Take 1 tablet by mouth every 8 (eight) hours. Must last 30 days.   No facility-administered medications prior to visit.     Objective    BP 121/81 (BP Location: Left Arm, Patient Position: Sitting, Cuff Size: Normal)   Pulse 93   Ht 5\' 10"  (1.778 m)   Wt 220 lb (99.8 kg)   SpO2 98%   BMI 31.57  kg/m   Physical Exam Vitals and nursing note reviewed.  Constitutional:      General: She is not in acute distress.    Appearance: Normal appearance. She is obese. She is not ill-appearing, toxic-appearing or diaphoretic.  HENT:     Head: Normocephalic and atraumatic.  Cardiovascular:     Rate and Rhythm: Normal rate and regular rhythm.     Pulses: Normal pulses.     Heart sounds: Normal heart sounds. No murmur heard.    No friction rub. No gallop.  Pulmonary:     Effort: Pulmonary effort is normal. No respiratory distress.     Breath sounds: Normal breath sounds. No stridor. No wheezing, rhonchi or rales.  Chest:     Chest wall: No tenderness.  Musculoskeletal:        General: No swelling, tenderness, deformity or signs of injury. Normal range of motion.     Right lower leg: No edema.     Left lower leg: No edema.  Skin:    General: Skin is warm and dry.     Capillary Refill: Capillary refill takes less than 2 seconds.     Coloration: Skin is not jaundiced or pale.     Findings: No bruising, erythema, lesion or rash.  Neurological:     General: No focal deficit present.     Mental Status: She is alert and oriented to person, place, and time. Mental status is at baseline.     Cranial Nerves: No cranial nerve deficit.     Sensory: No sensory deficit.     Motor: Weakness present.     Coordination: Coordination normal.     Gait: Gait abnormal.  Psychiatric:        Mood and Affect: Mood normal.        Behavior: Behavior normal.        Thought Content: Thought content normal.        Judgment: Judgment normal.     Results for orders placed or performed in visit on 07/06/23  POCT HgB A1C  Result Value Ref Range   Hemoglobin A1C 9.2 (A) 4.0 - 5.6 %   HbA1c POC (<> result, manual entry)     HbA1c, POC (prediabetic range)     HbA1c, POC (controlled diabetic range)      Assessment & Plan     Problem List Items Addressed This Visit       Cardiovascular and Mediastinum    Hypertension associated with diabetes (HCC)    Chronic, stable Continues on 2.5 mg lisinopril      Relevant Orders   CBC with Differential/Platelet   Comprehensive Metabolic Panel (CMET)     Endocrine   Hyperlipidemia associated with type 2 diabetes mellitus (HCC)    Chronic, remains on lipitor 20 mg Repeat LP The 10-year ASCVD risk score (Arnett DK, et al., 2019) is: 6.8%  Relevant Orders   Lipid panel   Hypothyroidism    Chronic; variable labs previously on 225 mcg  Repeat dose to reassess TSH      Relevant Orders   TSH+T4F+T3Free   Type 2 diabetes mellitus with diabetic neuropathy, with long-term current use of insulin (HCC) - Primary    Chronic, stable A1c not at goal Goal <8% Has not been taking her insulin; only using ozempic 1 mg Restart 10 units; wishes to defer use of CGM d/t cost concerns      Return in about 3 months (around 10/06/2023) for chonic disease management.     Leilani Merl, FNP, have reviewed all documentation for this visit. The documentation on 07/06/23 for the exam, diagnosis, procedures, and orders are all accurate and complete.  Jacky Kindle, FNP  [facility] [phone] (phone) [phone] (fax)  [facility]

## 2023-07-06 NOTE — Assessment & Plan Note (Signed)
Chronic, stable Continues on 2.5 mg lisinopril

## 2023-07-07 ENCOUNTER — Other Ambulatory Visit: Payer: Self-pay | Admitting: Family Medicine

## 2023-07-07 DIAGNOSIS — E1142 Type 2 diabetes mellitus with diabetic polyneuropathy: Secondary | ICD-10-CM | POA: Diagnosis not present

## 2023-07-07 LAB — COMPREHENSIVE METABOLIC PANEL
ALT: 23 IU/L (ref 0–32)
AST: 17 IU/L (ref 0–40)
Albumin: 4.1 g/dL (ref 3.8–4.9)
Alkaline Phosphatase: 72 IU/L (ref 44–121)
BUN/Creatinine Ratio: 32 — ABNORMAL HIGH (ref 9–23)
BUN: 16 mg/dL (ref 6–24)
Bilirubin Total: 1 mg/dL (ref 0.0–1.2)
CO2: 20 mmol/L (ref 20–29)
Calcium: 8.8 mg/dL (ref 8.7–10.2)
Chloride: 96 mmol/L (ref 96–106)
Creatinine, Ser: 0.5 mg/dL — ABNORMAL LOW (ref 0.57–1.00)
Globulin, Total: 2.4 g/dL (ref 1.5–4.5)
Glucose: 181 mg/dL — ABNORMAL HIGH (ref 70–99)
Potassium: 4.4 mmol/L (ref 3.5–5.2)
Sodium: 135 mmol/L (ref 134–144)
Total Protein: 6.5 g/dL (ref 6.0–8.5)
eGFR: 110 mL/min/{1.73_m2} (ref >59)

## 2023-07-07 LAB — CBC WITH DIFFERENTIAL/PLATELET
Basophils Absolute: 0.1 10*3/uL (ref 0.0–0.2)
Basos: 1 %
EOS (ABSOLUTE): 0.2 10*3/uL (ref 0.0–0.4)
Eos: 2 %
Hematocrit: 39 % (ref 34.0–46.6)
Hemoglobin: 13 g/dL (ref 11.1–15.9)
Immature Grans (Abs): 0.1 10*3/uL (ref 0.0–0.1)
Immature Granulocytes: 1 %
Lymphocytes Absolute: 3.7 10*3/uL — ABNORMAL HIGH (ref 0.7–3.1)
Lymphs: 37 %
MCH: 28.2 pg (ref 26.6–33.0)
MCHC: 33.3 g/dL (ref 31.5–35.7)
MCV: 85 fL (ref 79–97)
Monocytes Absolute: 0.6 10*3/uL (ref 0.1–0.9)
Monocytes: 6 %
Neutrophils Absolute: 5.2 10*3/uL (ref 1.4–7.0)
Neutrophils: 53 %
Platelets: 254 10*3/uL (ref 150–450)
RBC: 4.61 x10E6/uL (ref 3.77–5.28)
RDW: 13.1 % (ref 11.7–15.4)
WBC: 9.8 10*3/uL (ref 3.4–10.8)

## 2023-07-07 LAB — TSH+T4F+T3FREE
Free T4: 2.09 ng/dL — ABNORMAL HIGH (ref 0.82–1.77)
T3, Free: 2.4 pg/mL (ref 2.0–4.4)
TSH: 11.9 u[IU]/mL — ABNORMAL HIGH (ref 0.450–4.500)

## 2023-07-07 LAB — LIPID PANEL
Chol/HDL Ratio: 3.8 ratio (ref 0.0–4.4)
Cholesterol, Total: 132 mg/dL (ref 100–199)
HDL: 35 mg/dL — ABNORMAL LOW (ref >39)
LDL Chol Calc (NIH): 40 mg/dL (ref 0–99)
Triglycerides: 386 mg/dL — ABNORMAL HIGH (ref 0–149)
VLDL Cholesterol Cal: 57 mg/dL — ABNORMAL HIGH (ref 5–40)

## 2023-07-07 MED ORDER — LEVOTHYROXINE SODIUM 112 MCG PO TABS: 112.0000 ug | ORAL_TABLET | Freq: Every day | ORAL | 0 refills | Status: DC

## 2023-07-07 MED ORDER — LEVOTHYROXINE SODIUM 125 MCG PO TABS: 125.0000 ug | ORAL_TABLET | Freq: Every day | ORAL | 0 refills | Status: DC

## 2023-07-07 NOTE — Telephone Encounter (Signed)
Unable to refill per protocol, Rx request was refilled 07/07/23, duplicate request.  Requested Prescriptions  Pending Prescriptions Disp Refills   levothyroxine (SYNTHROID) 125 MCG tablet [Pharmacy Med Name: LEVOTHYROXINE 0.125MG  ( ) TAB] 90 tablet     Sig: TAKE 1 TABLET BY MOUTH DAILY, TAKE WITH 112 MCG DOSE 9REPLACES 200 TAKE 1 TABLET BY MOUTH 25 DOSING).     Endocrinology:  Hypothyroid Agents Failed - 07/07/2023  9:58 AM      Failed - TSH in normal range and within 360 days    TSH  Date Value Ref Range Status  07/06/2023 11.900 (H) 0.450 - 4.500 uIU/mL Final         Passed - Valid encounter within last 12 months    Recent Outpatient Visits           Yesterday Type 2 diabetes mellitus with diabetic neuropathy, without long-term current use of insulin [facility])   [place] [facility] Merita Norton T, FNP   3 months ago Type 2 diabetes mellitus with diabetic neuropathy, without long-term current use of insulin [facility])   [place] [facility] Merita Norton T, FNP   5 months ago [place]   [place] [facility] Alfredia Ferguson, PA-C   7 months ago Type 2 diabetes mellitus with diabetic neuropathy, without long-term current use of insulin [facility])   [place] [facility] Merita Norton T, FNP   10 months ago Type 2 diabetes mellitus with diabetic neuropathy, without long-term current use of insulin [facility])   [place] [facility] Merita Norton T, FNP       Future Appointments             In 2 months McGowan, Elana Alm [facility] [place]   In 3 months Jacky Kindle, FNP [facility]             EUTHYROX 112 MCG tablet [place] [facility] Name: EUTHYROX 0.112MG  ( ) TABLETS] 90 tablet     Sig: TAKE 1 TABLET BY MOUTH DAILY, TAKE WITH 125 MCG     Endocrinology:  Hypothyroid Agents Failed - 07/07/2023  9:58 AM      Failed - TSH in normal range and within 360  days    TSH  Date Value Ref Range Status  07/06/2023 11.900 (H) 0.450 - 4.500 uIU/mL Final         Passed - Valid encounter within last 12 months    Recent Outpatient Visits           Yesterday Type 2 diabetes mellitus with diabetic neuropathy, without long-term current use of insulin [facility])   [place] [facility] Merita Norton T, FNP   3 months ago Type 2 diabetes mellitus with diabetic neuropathy, without long-term current use of insulin [facility])   [place] [facility] Merita Norton T, FNP   5 months ago Pr[place]itus   [place] [facility] Alfredia Ferguson, PA-C   7 months ago Type 2 diabetes mellitus with diabetic neuropathy, without long-term current use of insulin [facility])   [place] [facility] Merita Norton T, FNP   10 months ago Type 2 diabetes mellitus with diabetic neuropathy, without long-term current use of insulin [facility])    [facility] Jacky Kindle, FNP       Future Appointments             In 2 months McGowan, [place], PA-C [facility] [place]   In 3 months Suzie Portela, Daryl [place], FNP [facility], [facility]

## 2023-07-08 LAB — CREATININE, URINE, RANDOM: Creatinine, Urine: 252 mg/dL

## 2023-07-08 LAB — POTASSIUM, URINE, RANDOM: Potassium Urine: 72 mmol/L

## 2023-07-08 LAB — SODIUM, URINE, RANDOM: Sodium, Ur: 33 mmol/L

## 2023-07-09 ENCOUNTER — Other Ambulatory Visit: Payer: Self-pay | Admitting: Family Medicine

## 2023-07-09 LAB — TOXASSURE SELECT 13 (MW), URINE

## 2023-07-19 ENCOUNTER — Encounter: Payer: Self-pay | Admitting: Family Medicine

## 2023-07-21 DIAGNOSIS — R2689 Other abnormalities of gait and mobility: Secondary | ICD-10-CM | POA: Diagnosis not present

## 2023-07-21 DIAGNOSIS — I10 Essential (primary) hypertension: Secondary | ICD-10-CM | POA: Diagnosis not present

## 2023-08-02 ENCOUNTER — Encounter: Payer: Self-pay | Admitting: Student in an Organized Health Care Education/Training Program

## 2023-08-02 ENCOUNTER — Ambulatory Visit
Payer: BC Managed Care – PPO | Attending: Student in an Organized Health Care Education/Training Program | Admitting: Student in an Organized Health Care Education/Training Program

## 2023-08-02 DIAGNOSIS — M792 Neuralgia and neuritis, unspecified: Secondary | ICD-10-CM | POA: Diagnosis not present

## 2023-08-02 DIAGNOSIS — E1142 Type 2 diabetes mellitus with diabetic polyneuropathy: Secondary | ICD-10-CM | POA: Insufficient documentation

## 2023-08-02 DIAGNOSIS — G8929 Other chronic pain: Secondary | ICD-10-CM | POA: Diagnosis not present

## 2023-08-02 MED ORDER — CAPSAICIN-CLEANSING GEL 8 % EX KIT
4.0000 | PACK | Freq: Once | CUTANEOUS | Status: AC
  Administered 2023-08-02: 4 via TOPICAL
  Filled 2023-08-02: qty 4

## 2023-08-02 NOTE — Progress Notes (Signed)
Safety precautions to be maintained throughout the outpatient stay will include: orient to surroundings, keep bed in low position, maintain call bell within reach at all times, provide assistance with transfer out of bed and ambulation.  

## 2023-08-02 NOTE — Progress Notes (Signed)
PROVIDER NOTE: Interpretation of information contained herein should be left to medically-trained personnel. Specific patient instructions are provided elsewhere under "Patient Instructions" section of medical record. This document was created in part using STT-dictation technology, any transcriptional errors that may result from this process are unintentional.  Patient: Gabriella Marsh Type: Established DOB: 03-02-1966 MRN: [id number] PCP: Jacky Kindle, FNP  Service: Procedure DOS: 08/02/2023 Setting: Ambulatory Location: Ambulatory outpatient facility Delivery: Face-to-face Provider: Edward Jolly, MD Specialty: Interventional Pain Management Specialty designation: 09 Location: Outpatient facility Ref. Prov.: Edward Jolly, MD    Primary Reason for Visit: Interventional Pain Management Treatment. CC: foot pain    Procedure #1:   Qutenza Neurolysis for PDN     1. Diabetic peripheral neuropathy (HCC)   2. Chronic peripheral neuropathic pain    NAS-11 Pain score:   Pre-procedure: 8 /10   Post-procedure: 8 /10     Pre-op H&P Assessment:  Gabriella Marsh is a 57 y.o. (year old), female patient, seen today for interventional treatment. She  has a past surgical history that includes Cesarean section; Shoulder surgery (Left, 12/26/014); Abdominal hysterectomy; Tonsillectomy and adenoidectomy; Uterine fibroid surgery; Ovarian cyst surgery; Cholecystectomy; left arm frature (10/12/2020); Thyroidectomy (N/A, 03/28/2021); Parathyroidectomy (03/28/2021); Amputation toe (Right, 07/24/2021); Colonoscopy with propofol (N/A, 02/20/2022); and right femur surgery (Right). Gabriella Marsh has a current medication list which includes the following prescription(s): acetaminophen, atorvastatin, cyclobenzaprine, fluticasone, gabapentin, gabapentin, lantus solostar, levothyroxine, levothyroxine, lisinopril, melatonin, metformin, nortriptyline, omeprazole, oxycodone-acetaminophen, [START ON 08/12/2023]  oxycodone-acetaminophen, [START ON 09/11/2023] oxycodone-acetaminophen, semaglutide (1 mg/dose), trazodone, and clopidogrel. Her primarily concern today is the Foot Pain (bilat)  Initial Vital Signs:  Pulse/HCG Rate: 87  Temp: (!) 97.1 F (36.2 C) Resp: 16 BP: (!) 144/79 SpO2: 99 %  BMI: Estimated body mass index is 31.57 kg/m as calculated from the following:   Height as of this encounter: 5\' 10"  (1.778 m).   Weight as of this encounter: 220 lb (99.8 kg).  Risk Assessment: Allergies: Reviewed. She is allergic to celecoxib, pregabalin, buspar [buspirone], citalopram, and other.  Allergy Precautions: None required Coagulopathies: Reviewed. None identified.  Blood-thinner therapy: None at this time Active Infection(s): Reviewed. None identified. Gabriella Marsh is afebrile  Site Confirmation: [name] was asked to confirm the procedure and laterality before marking the site Procedure checklist: Completed Consent: Before the procedure and under the influence of no sedative(s), amnesic(s), or anxiolytics, the patient was informed of the treatment options, risks and possible complications. To fulfill our ethical and legal obligations, as recommended by the American Medical Association's Code of Ethics, I have informed the patient of my clinical impression; the nature and purpose of the treatment or procedure; the risks, benefits, and possible complications of the intervention; the alternatives, including doing nothing; the risk(s) and benefit(s) of the alternative treatment(s) or procedure(s); and the risk(s) and benefit(s) of doing nothing. The patient was provided information about the general risks and possible complications associated with the procedure. These may include, but are not limited to: failure to achieve desired goals, infection, bleeding, organ or nerve damage, allergic reactions, paralysis, and death. In addition, the patient was informed of those risks and complications  associated to the procedure, such as failure to decrease pain; infection; bleeding; organ or nerve damage with subsequent damage to sensory, motor, and/or autonomic systems, resulting in permanent pain, numbness, and/or weakness of one or several areas of the body; allergic reactions; (i.e.: anaphylactic reaction); and/or death. Furthermore, the patient was informed of those risks and complications associated with  the medications. These include, but are not limited to: allergic reactions (i.e.: anaphylactic or anaphylactoid reaction(s)); adrenal axis suppression; blood sugar elevation that in diabetics may result in ketoacidosis or comma; water retention that in patients with history of congestive heart failure may result in shortness of breath, pulmonary edema, and decompensation with resultant heart failure; weight gain; swelling or edema; medication-induced neural toxicity; particulate matter embolism and blood vessel occlusion with resultant organ, and/or nervous system infarction; and/or aseptic necrosis of one or more joints. Finally, the patient was informed that Medicine is not an exact science; therefore, there is also the possibility of unforeseen or unpredictable risks and/or possible complications that may result in a catastrophic outcome. The patient indicated having understood very clearly. We have given the patient no guarantees and we have made no promises. Enough time was given to the patient to ask questions, all of which were answered to the patient's satisfaction. Gabriella Marsh has indicated that she wanted to continue with the procedure. Attestation: I, the ordering provider, attest that I have discussed with the patient the benefits, risks, side-effects, alternatives, likelihood of achieving goals, and potential problems during recovery for the procedure that I have provided informed consent. Date  Time: 08/02/2023 10:57 AM  Pre-Procedure Preparation:  Monitoring: As per clinic protocol.  Respiration, ETCO2, SpO2, BP, heart rate and rhythm monitor placed and checked for adequate function Safety Precautions: Patient was assessed for positional comfort and pressure points before starting the procedure. Time-out: I initiated and conducted the "Time-out" before starting the procedure, as per protocol. The patient was asked to participate by confirming the accuracy of the "Time Out" information. Verification of the correct person, site, and procedure were performed and confirmed by me, the nursing staff, and the patient. "Time-out" conducted as per Joint Commission's Universal Protocol (UP.01.01.01). Time: 1122  Description of Procedure:           Area Prepped: Entire foot Region DuraPrep (Iodine Povacrylex [0.7% available iodine] and Isopropyl Alcohol, 74% w/w)                   2,  8% Qutenza patches were applied to each foot, on the plantar and dorsal aspect covering areas that were painful. 4 total patches with used, 2 for each foot Patient tolerated the procedure well without any issues.    Vitals:   08/02/23 1105 08/02/23 1107  BP: (!) 144/79 138/88  Pulse: 87 81  Resp: 16 16  Temp: (!) 97.1 F (36.2 C)   SpO2: 99% 99%  Weight: 220 lb (99.8 kg)   Height: 5\' 10"  (1.778 m)     Start Time: 1127 hrs. End Time: 1205 hrs.     Post-operative Assessment:  Post-procedure Vital Signs:  Pulse/HCG Rate: 81  Temp:  (!) 97.1 F (36.2 C) Resp: 16 BP:  138/88 SpO2: 99 %  EBL: None  Complications: No immediate post-treatment complications observed by team, or reported by patient.  Note: The patient tolerated the entire procedure well. A repeat set of vitals were taken after the procedure and the patient was kept under observation following institutional policy, for this type of procedure. Post-procedural neurological assessment was performed, showing return to baseline, prior to discharge. The patient was provided with post-procedure discharge instructions,  including a section on how to identify potential problems. Should any problems arise concerning this procedure, the patient was given instructions to immediately contact us, at any time, without hesitation. In any case, we plan to contact the patient by telephone  for a follow-up status report regarding this interventional procedure.  Comments:  No additional relevant information.  Plan of Care    Chronic Opioid Analgesic:  Percocet 5 mg TID as needed, quantity 75/month   Medications ordered for procedure: Meds ordered this encounter  Medications   capsaicin topical system 8 % patch 4 patch   Medications administered: We administered capsaicin topical system.  See the medical record for exact dosing, route, and time of administration.  Follow-up plan:   Return for Keep sch. appt.      Recent Visits Date Type Provider Dept  07/06/23 Office Visit Edward Jolly, MD [facility]  Showing recent visits within past 90 days and meeting all other requirements Today's Visits Date Type Provider Dept  08/02/23 Procedure visit Edward Jolly, MD [facility]  Showing today's visits and meeting all other requirements Future Appointments Date Type Provider Dept  10/07/23 Appointment Edward Jolly, MD [facility]  Showing future appointments within next 90 days and meeting all other requirements  Disposition: Discharge home  Discharge (Date  Time): 08/02/2023; 1206 hrs.   Primary Care Physician: Jacky Kindle, FNP Location: [facility] Note by: Edward Jolly, MD Date: 08/02/2023; Time: 1:11 PM  Disclaimer:  Medicine is not an exact science. The only guarantee in medicine is that nothing is guaranteed. It is important to note that the decision to proceed with this intervention was based on the information collected from the patient. The Data and conclusions were drawn from the patient's questionnaire, the interview, and the physical  examination. Because the information was provided in large part by the patient, it cannot be guaranteed that it has not been purposely or unconsciously manipulated. Every effort has been made to obtain as much relevant data as possible for this evaluation. It is important to note that the conclusions that lead to this procedure are derived in large part from the available data. Always take into account that the treatment will also be dependent on availability of resources and existing treatment guidelines, considered by other Pain Management Practitioners as being common knowledge and practice, at the time of the intervention. For Medico-Legal purposes, it is also important to point out that variation in procedural techniques and pharmacological choices are the acceptable norm. The indications, contraindications, technique, and results of the above procedure should only be interpreted and judged by a Board-Certified Interventional Pain Specialist with extensive familiarity and expertise in the same exact procedure and technique.

## 2023-08-03 ENCOUNTER — Telehealth: Payer: Self-pay

## 2023-08-03 NOTE — Telephone Encounter (Signed)
Post procedure follow up.  Patient states she is doing good.  

## 2023-08-09 ENCOUNTER — Other Ambulatory Visit: Payer: Self-pay | Admitting: Family Medicine

## 2023-08-09 DIAGNOSIS — F5101 Primary insomnia: Secondary | ICD-10-CM

## 2023-08-15 ENCOUNTER — Encounter: Payer: Self-pay | Admitting: Family Medicine

## 2023-08-21 DIAGNOSIS — R2689 Other abnormalities of gait and mobility: Secondary | ICD-10-CM | POA: Diagnosis not present

## 2023-08-21 DIAGNOSIS — I10 Essential (primary) hypertension: Secondary | ICD-10-CM | POA: Diagnosis not present

## 2023-09-08 NOTE — Progress Notes (Unsigned)
09/09/2023 9:44 AM   Gabriella Marsh 10/18/1966 [id number]  Referring provider: Jacky Kindle, FNP [address] [place],  [place] [address]  Urological history: 1.  Urinary retention -Contributing factors of not ambulating, uncontrolled diabetes, lumbar DDD and obesity  Chief Complaint  Patient presents with   Follow-up   HPI: Gabriella Marsh is a 57 y.o. female who presents today for a three month follow up.    Previous records reviewed.  NO acute changes.   She is having 8 or more daytime voids, nocturia x 1-2 with a mild urge to urinate.  She is not having urinary incontinence.  She does limit fluid intake.  She does engage in toilet mapping.  Patient denies any modifying or aggravating factors.  Patient denies any recent UTI's, gross hematuria, dysuria or suprapubic/flank pain.  Patient denies any fevers, chills, nausea or vomiting.    PVR 0 mL    PMH: Past Medical History:  Diagnosis Date   Arthritis    knees   Blood clotting disorder (HCC)    on toe    Degenerative disc disease, lumbar    Diabetes mellitus without complication (HCC)    GERD (gastroesophageal reflux disease)    Headache    daily - AM (has not been able to have SPG blocks lately)   Hyperlipidemia    Hypertension    Neuropathy    feet   Vertigo    3-4x/yr    Surgical History: Past Surgical History:  Procedure Laterality Date   ABDOMINAL HYSTERECTOMY     But still has cervix   AMPUTATION TOE Right 07/24/2021   Procedure: AMPUTATION TOE;  Surgeon: Linus Galas, DPM;  Location: [facility] ORS;  Service: Podiatry;  Laterality: Right;   CESAREAN SECTION     CHOLECYSTECTOMY     COLONOSCOPY WITH PROPOFOL N/A 02/20/2022   Procedure: COLONOSCOPY WITH PROPOFOL;  Surgeon: Wyline Mood, MD;  [facility];  Service: Gastroenterology;  Laterality: N/A;   left arm frature  10/12/2020   OVARIAN CYST SURGERY     PARATHYROIDECTOMY  03/28/2021   Procedure: PARATHYROIDECTOMY  AUTOTRANSPLANT;  Surgeon: Duanne Guess, MD;  Location: [facility] ORS;  Service: General;;   right femur surgery Right    SHOULDER SURGERY Left 12/26/014   Dr. Elfredia Nevins, [facility]   THYROIDECTOMY N/A 03/28/2021   Procedure: THYROIDECTOMY, total;  Surgeon: Duanne Guess, MD;  Location: [facility] ORS;  Service: General;  Laterality: N/A;  Provider requesting 3 hours /180 minutes for procedure   TONSILLECTOMY AND ADENOIDECTOMY     UTERINE FIBROID SURGERY      Home Medications:  Allergies as of 09/09/2023       Reactions   Celecoxib Shortness Of Breath   Pregabalin Shortness Of Breath   Buspar [buspirone] Other (See Comments)   Tachycardia   Citalopram Cough   Other Cough   [place] pear trees        Medication List        Accurate as of September 09, 2023  9:44 AM. If you have any questions, ask your nurse or doctor.          STOP taking these medications    clopidogrel 75 MG tablet Commonly known as: PLAVIX Stopped by: Karey Stucki       TAKE these medications    acetaminophen 500 MG tablet Commonly known as: TYLENOL Take 1,000 mg by mouth every 12 (twelve) hours as needed for moderate pain.   atorvastatin 20 MG tablet Commonly known as:  LIPITOR TAKE 1 TABLET(20 MG) BY MOUTH DAILY   cyclobenzaprine 5 MG tablet Commonly known as: FLEXERIL Take 1 tablet (5 mg total) by mouth 3 (three) times daily as needed for muscle spasms.   fluticasone 50 MCG/ACT nasal spray Commonly known as: FLONASE Place 2 sprays into both nostrils daily.   gabapentin 100 MG capsule Commonly known as: NEURONTIN TAKE 2 CAPSULES THREE TIMES A DAY ALONG WITH 600 MG THREE TIMES A DAY FOR A TOTAL DAILY DOSE OF 2400 MG DAILY   gabapentin 600 MG tablet Commonly known as: NEURONTIN Take 1 tablet (600 mg total) by mouth 3 (three) times daily.   Lantus SoloStar 100 UNIT/ML Solostar Pen Generic drug: insulin glargine Inject 24 Units into the skin at bedtime.   levothyroxine 112 MCG  tablet Commonly known as: SYNTHROID Take 1 tablet (112 mcg total) by mouth daily. Take with 125 mcg for dose of 237; replaces 200+25 dosing given TSH >11   levothyroxine 125 MCG tablet Commonly known as: SYNTHROID Take 1 tablet (125 mcg total) by mouth daily. Take with 112 mcg for dose of 237; replaces 200+25 dosing given TSH >11   lisinopril 2.5 MG tablet Commonly known as: ZESTRIL Take 1 tablet (2.5 mg total) by mouth daily. What changed:  when to take this reasons to take this   Melatonin 10 MG Tabs Take 10 mg by mouth at bedtime.   metFORMIN 500 MG tablet Commonly known as: GLUCOPHAGE Take 2 tablets (1,000 mg total) by mouth 2 (two) times daily with a meal.   nortriptyline 25 MG capsule Commonly known as: PAMELOR Take 2 capsules (50 mg total) by mouth at bedtime.   omeprazole 20 MG capsule Commonly known as: PRILOSEC TAKE 1 CAPSULE DAILY   oxyCODONE-acetaminophen 5-325 MG tablet Commonly known as: Percocet Take 1 tablet by mouth every 8 (eight) hours. Must last 30 days.   oxyCODONE-acetaminophen 5-325 MG tablet Commonly known as: Percocet Take 1 tablet by mouth every 8 (eight) hours. Must last 30 days.   oxyCODONE-acetaminophen 5-325 MG tablet Commonly known as: Percocet Take 1 tablet by mouth every 8 (eight) hours. Must last 30 days. Start taking on: September 11, 2023   Semaglutide (1 MG/DOSE) 4 MG/3ML Sopn Inject 1 mg as directed once a week.   traZODone 50 MG tablet Commonly known as: DESYREL TAKE 2 TABLETS(100 MG) BY MOUTH AT BEDTIME        Allergies:  Allergies  Allergen Reactions   Celecoxib Shortness Of Breath   Pregabalin Shortness Of Breath   Buspar [Buspirone] Other (See Comments)    Tachycardia   Citalopram Cough   Other Cough    [place] pear trees    Family History: Family History  Problem Relation Age of Onset   Diabetes Father    Heart disease Father    Hypertension Father    Hyperlipidemia Father    Congestive Heart Failure  Father    Healthy Brother    Osteoporosis Mother    Irritable bowel syndrome Mother    Osteoporosis Maternal Grandmother     Social History:  reports that she quit smoking about 11 years ago. Her smoking use included cigarettes. She has never used smokeless tobacco. She reports that she does not drink alcohol and does not use drugs.  ROS: Pertinent ROS in HPI  Physical Exam: BP 105/69   Pulse 91   Constitutional:  Well nourished. Alert and oriented, No acute distress. HEENT: [place] AT, moist mucus membranes.  Trachea midline Cardiovascular: No clubbing,  cyanosis, or edema. Respiratory: Normal respiratory effort, no increased work of breathing. Neurologic: Grossly intact, no focal deficits, moving all 4 extremities.  In Wheel chair Psychiatric: Normal mood and affect.    Laboratory Data: N/A  Pertinent Imaging:  09/09/23 09:04  Scan Result 0 ml     Assessment & Plan:    1. Urinary retention -resolved   Return for PVR and OAB questionnaire.  These notes generated with voice recognition software. I apologize for typographical errors.  Cloretta Ned  [facility] [address]  [address] [place], [place] [address] [phone]

## 2023-09-09 ENCOUNTER — Ambulatory Visit (INDEPENDENT_AMBULATORY_CARE_PROVIDER_SITE_OTHER): Payer: BC Managed Care – PPO | Admitting: Urology

## 2023-09-09 ENCOUNTER — Encounter: Payer: Self-pay | Admitting: Urology

## 2023-09-09 VITALS — BP 105/69 | HR 91

## 2023-09-09 DIAGNOSIS — R339 Retention of urine, unspecified: Secondary | ICD-10-CM | POA: Diagnosis not present

## 2023-09-09 LAB — BLADDER SCAN AMB NON-IMAGING: Scan Result: 0

## 2023-09-13 ENCOUNTER — Encounter: Payer: Self-pay | Admitting: Family Medicine

## 2023-09-13 DIAGNOSIS — Z794 Long term (current) use of insulin: Secondary | ICD-10-CM

## 2023-09-13 DIAGNOSIS — F5101 Primary insomnia: Secondary | ICD-10-CM

## 2023-09-13 DIAGNOSIS — E1140 Type 2 diabetes mellitus with diabetic neuropathy, unspecified: Secondary | ICD-10-CM

## 2023-09-13 DIAGNOSIS — K219 Gastro-esophageal reflux disease without esophagitis: Secondary | ICD-10-CM

## 2023-09-13 DIAGNOSIS — M62838 Other muscle spasm: Secondary | ICD-10-CM

## 2023-09-13 DIAGNOSIS — G2581 Restless legs syndrome: Secondary | ICD-10-CM

## 2023-09-13 NOTE — Telephone Encounter (Signed)
Have pended meds for you just to sign. Patient has a question about her needles. Please review patient message.

## 2023-09-14 MED ORDER — TRAZODONE HCL 100 MG PO TABS
100.0000 mg | ORAL_TABLET | Freq: Every day | ORAL | 3 refills | Status: DC
Start: 2023-09-14 — End: 2024-06-28

## 2023-09-14 MED ORDER — GABAPENTIN 100 MG PO CAPS: ORAL_CAPSULE | ORAL | 3 refills | Status: DC

## 2023-09-14 MED ORDER — LANTUS SOLOSTAR 100 UNIT/ML ~~LOC~~ SOPN
24.0000 [IU] | PEN_INJECTOR | Freq: Every day | SUBCUTANEOUS | 1 refills | Status: DC
Start: 2023-09-14 — End: 2024-05-16

## 2023-09-14 MED ORDER — GABAPENTIN 600 MG PO TABS
600.0000 mg | ORAL_TABLET | Freq: Three times a day (TID) | ORAL | 3 refills | Status: DC
Start: 2023-09-14 — End: 2024-01-06

## 2023-09-14 MED ORDER — NORTRIPTYLINE HCL 50 MG PO CAPS
50.0000 mg | ORAL_CAPSULE | Freq: Every day | ORAL | 3 refills | Status: DC
Start: 2023-09-14 — End: 2024-09-13

## 2023-09-14 MED ORDER — METFORMIN HCL 500 MG PO TABS: 1000.0000 mg | ORAL_TABLET | Freq: Two times a day (BID) | ORAL | 3 refills | Status: DC

## 2023-09-14 MED ORDER — OMEPRAZOLE 20 MG PO CPDR
20.0000 mg | DELAYED_RELEASE_CAPSULE | Freq: Every day | ORAL | 3 refills | Status: DC
Start: 2023-09-14 — End: 2024-01-13

## 2023-09-14 MED ORDER — CYCLOBENZAPRINE HCL 5 MG PO TABS
5.0000 mg | ORAL_TABLET | Freq: Three times a day (TID) | ORAL | 3 refills | Status: DC | PRN
Start: 2023-09-14 — End: 2024-10-17

## 2023-09-14 MED ORDER — ATORVASTATIN CALCIUM 20 MG PO TABS: ORAL_TABLET | ORAL | 3 refills | Status: DC

## 2023-09-14 MED ORDER — LISINOPRIL 2.5 MG PO TABS: 2.5000 mg | ORAL_TABLET | Freq: Every day | ORAL | 1 refills | Status: DC

## 2023-09-16 ENCOUNTER — Other Ambulatory Visit: Payer: Self-pay | Admitting: Family Medicine

## 2023-09-16 ENCOUNTER — Other Ambulatory Visit: Payer: Self-pay

## 2023-09-16 MED ORDER — LEVOTHYROXINE SODIUM 125 MCG PO TABS: 125.0000 ug | ORAL_TABLET | Freq: Every day | ORAL | 0 refills | Status: DC

## 2023-09-16 MED ORDER — LEVOTHYROXINE SODIUM 112 MCG PO TABS: 112.0000 ug | ORAL_TABLET | Freq: Every day | ORAL | 0 refills | Status: DC

## 2023-09-16 NOTE — Telephone Encounter (Signed)
Medication Refill - Medication: levothyroxine (SYNTHROID) 112 MCG tablet [[id number]] levothyroxine (SYNTHROID) 125 MCG tablet [[id number]]   Has the patient contacted their pharmacy? Yes.    (Agent: If yes, when and what did the pharmacy advise?) Contact PCP   Preferred Pharmacy (with phone number or street name):  [id number] - [place], [place] - [address] AT [facility] [address]    Has the patient been seen for an appointment in the last year OR does the patient have an upcoming appointment? Yes.    Agent: Please be advised that RX refills may take up to 3 business days. We ask that you follow-up with your pharmacy.

## 2023-09-16 NOTE — Telephone Encounter (Signed)
Requested medication (s) are due for refill today: yes  Requested medication (s) are on the active medication list: yes   Last refill:  07/07/23 #60 0 refills   Future visit scheduled: yes in 2 weeks   Notes to clinic:  patient requesting 90 day supply. Do you want to refill Rx or wait until after OV in 2 weeks?     Requested Prescriptions  Pending Prescriptions Disp Refills   levothyroxine (SYNTHROID) 112 MCG tablet [Pharmacy Med Name: LEVOTHYROXINE 0.112MG  ( ) TABS] 90 tablet     Sig: TAKE 1 TABLET BY MOUTH DAILY, TAKE WITH 125 MCG     Endocrinology:  Hypothyroid Agents Failed - 09/16/2023  9:23 AM      Failed - TSH in normal range and within 360 days    TSH  Date Value Ref Range Status  07/06/2023 11.900 (H) 0.450 - 4.500 uIU/mL Final         Passed - Valid encounter within last 12 months    Recent Outpatient Visits           2 months ago Type 2 diabetes mellitus with diabetic neuropathy, without long-term current use of insulin (HCC)   [place] [facility] Merita Norton T, FNP   6 months ago Type 2 diabetes mellitus with diabetic neuropathy, without long-term current use of insulin [facility])   [place] [facility] Norton T, FNP   8 months ago [place]   [place] [facility] Alfredia Ferguson, PA-C   9 months ago Type 2 diabetes mellitus with diabetic neuropathy, without long-term current use of insulin [facility])   [place] [facility] Mer[facility] Norton T, FNP   1 year ago Type 2 diabetes mellitus with diabetic neuropathy, without long-term current use of insulin [facility])   [facility] Jacky Kindle, FNP       Future Appointments             In 2 weeks Jacky Kindle, FNP [facility], [facility]   In 11 months McGowan, Elana Alm [facility]

## 2023-09-16 NOTE — Telephone Encounter (Signed)
Requested medication (s) are due for refill today: see request  Requested medication (s) are on the active medication list: yes   Last refill:  07/07/23 #60 0 refills  Future visit scheduled: yes in 2 weeks   Notes to clinic:  patient requesting refills. Do you want to refill #60 or 90 day supply as previously requested for 0.112 mcg dose prior to appt? Please advise      Requested Prescriptions  Pending Prescriptions Disp Refills   levothyroxine (SYNTHROID) 125 MCG tablet 60 tablet 0    Sig: Take 1 tablet (125 mcg total) by mouth daily. Take with 112 mcg for dose of 237; replaces 200+25 dosing given TSH >11     Endocrinology:  Hypothyroid Agents Failed - 09/16/2023 10:11 AM      Failed - TSH in normal range and within 360 days    TSH  Date Value Ref Range Status  07/06/2023 11.900 (H) 0.450 - 4.500 uIU/mL Final         Passed - Valid encounter within last 12 months    Recent Outpatient Visits           2 months ago Type 2 diabetes mellitus with diabetic neuropathy, without long-term current use of insulin (HCC)   [place] [facility] Merita Norton T, FNP   6 months ago Type 2 diabetes mellitus with diabetic neuropathy, without long-term current use of insulin [facility])   [place] [facility] Merita Norton T, FNP   8 months ago [place]   [place] [facility] Alfredia Ferguson, PA-C   9 months ago Type 2 diabetes mellitus with diabetic neuropathy, without long-term current use of insulin [facility])   [place] [facility] Merita Norton T, FNP   1 year ago Type 2 diabetes mellitus with diabetic neuropathy, without long-term current use of insulin [facility])   [place] [facility] Jacky Kindle, FNP       Future Appointments             In 2 weeks Jacky Kindle, FNP [facility], [facility]   In 11 months McGowan, Elana Alm [facility] [place]            Refused  Prescriptions Disp Refills   levothyroxine (SYNTHROID) 112 MCG tablet 60 tablet 0    Sig: Take 1 tablet (112 mcg total) by mouth daily. Take with 125 mcg for dose of 237; replaces 200+25 dosing given TSH >11     Endocrinology:  Hypothyroid Agents Failed - 09/16/2023 10:11 AM      Failed - TSH in normal range and within 360 days    TSH  Date Value Ref Range Status  07/06/2023 11.900 (H) 0.450 - 4.500 uIU/mL Final         Passed - Valid encounter within last 12 months    Recent Outpatient Visits           2 months ago Type 2 diabetes mellitus with diabetic neuropathy, without long-term current use of insulin [facility])   [place] [facility] Merita Norton T, FNP   6 months ago Type 2 diabetes mellitus with diabetic neuropathy, without long-term current use of insulin [facility])   [place] [facility] Merita Norton T, FNP   8 months ago [place]   [place] [facility] Alfredia Ferguson, PA-C   9 months ago Type 2 diabetes mellitus with diabetic neuropathy, without long-term current use of  insulin [facility])   [place] [facility] Merita Norton T, FNP   1 year ago Type 2 diabetes mellitus with diabetic neuropathy, without long-term current use of insulin [facility])   [place] [facility] Jacky Kindle, FNP       Future Appointments             In 2 weeks Jacky Kindle, FNP [facility], [facility]   In 11 months McGowan, [facility]

## 2023-09-16 NOTE — Telephone Encounter (Signed)
Requested refill. Duplicate request today .  Requested Prescriptions  Pending Prescriptions Disp Refills   levothyroxine (SYNTHROID) 125 MCG tablet 60 tablet 0    Sig: Take 1 tablet (125 mcg total) by mouth daily. Take with 112 mcg for dose of 237; replaces 200+25 dosing given TSH >11     Endocrinology:  Hypothyroid Agents Failed - 09/16/2023 10:11 AM      Failed - TSH in normal range and within 360 days    TSH  Date Value Ref Range Status  07/06/2023 11.900 (H) 0.450 - 4.500 uIU/mL Final         Passed - Valid encounter within last 12 months    Recent Outpatient Visits           2 months ago Type 2 diabetes mellitus with diabetic neuropathy, without long-term current use of insulin (HCC)   [place] [facility] Merita Norton T, FNP   6 months ago Type 2 diabetes mellitus with diabetic neuropathy, without long-term current use of insulin [facility])   [place] [facility] Merita Norton T, FNP   8 months ago [place]ur[place]   [place] [facility] Alfredia Ferguson, PA-C   9 months ago Type 2 diabetes mellitus with diabetic neuropathy, without long-term current use of insulin [facility])   [place] [facility] Merita Norton T, FNP   1 year ago Type 2 diabetes mellitus with diabetic neuropathy, without long-term current use of insulin [facility])   [place] [facility] Jacky Kindle, FNP       Future Appointments             In 2 weeks Jacky Kindle, FNP [facility], [facility]   In 11 months McGowan, Elana Alm [facility] [place]            Refused Prescriptions Disp Refills   levothyroxine (SYNTHROID) 112 MCG tablet 60 tablet 0    Sig: Take 1 tablet (112 mcg total) by mouth daily. Take with 125 mcg for dose of 237; replaces 200+25 dosing given TSH >11     Endocrinology:  Hypothyroid Agents Failed - 09/16/2023 10:11 AM      Failed - TSH in normal range and within 360 days     TSH  Date Value Ref Range Status  07/06/2023 11.900 (H) 0.450 - 4.500 uIU/mL Final         Passed - Valid encounter within last 12 months    Recent Outpatient Visits           2 months ago Type 2 diabetes mellitus with diabetic neuropathy, without long-term current use of insulin [facility])   [place] [facility] Merita Norton T, FNP   6 months ago Type 2 diabetes mellitus with diabetic neuropathy, without long-term current use of insulin [facility])   [place] [facility] Merita Norton T, FNP   8 months ago Pruritus   [place] [facility] Alfredia Ferguson, PA-C   9 months ago Type 2 diabetes mellitus with diabetic neuropathy, without long-term current use of insulin [facility])   [place] [facility] Merita Norton T, FNP   1 year ago Type 2 diabetes mellitus with diabetic neuropathy, without long-term current use of insulin [facility])   [place] [facility] Jacky Kindle, FNP       Future Appointments             In 2 weeks Suzie Portela,  Daryl Eastern, FNP [place] [facility], [facility]   In 11 months McGowan, Elana Alm [facility] [place]

## 2023-09-17 NOTE — Telephone Encounter (Signed)
Requested medications are due for refill today.  no  Requested medications are on the active medications list.  yes  Last refill. 09/16/2023 #30 0 rf  Future visit scheduled.   yes  Notes to clinic.  Abnormal labs.    Requested Prescriptions  Pending Prescriptions Disp Refills   levothyroxine (SYNTHROID) 125 MCG tablet [Pharmacy Med Name: LEVOTHYROXINE 0.125MG  ( ) TAB] 90 tablet     Sig: TAKE 1 TABLET BY MOUTH EVERY DAY ALONG WITH 112 MCG TABLET TO TOTAL 237 MCG DAILY     Endocrinology:  Hypothyroid Agents Failed - 09/16/2023  7:43 PM      Failed - TSH in normal range and within 360 days    TSH  Date Value Ref Range Status  07/06/2023 11.900 (H) 0.450 - 4.500 uIU/mL Final         Passed - Valid encounter within last 12 months    Recent Outpatient Visits           2 months ago Type 2 diabetes mellitus with diabetic neuropathy, without long-term current use of insulin (HCC)   [place] [facility] Merita Norton T, FNP   6 months ago Type 2 diabetes mellitus with diabetic neuropathy, without long-term current use of insulin [facility])   [place] [facility] Merita Norton T, FNP   8 months ago Pruritus   [place] [facility] Alfredia Ferguson, PA-C   9 months ago Type 2 diabetes mellitus with diabetic neuropathy, without long-term current use of insulin [facility])   [place] [facility] Merita Norton T, FNP   1 year ago Type 2 diabetes mellitus with diabetic neuropathy, without long-term current use of insulin [facility])   [place] [facility] Jacky Kindle, FNP       Future Appointments             In 2 weeks Jacky Kindle, FNP [facility], [facility]   In 11 months Marvel Plan, Elana Alm [facility] [place]             levothyroxine (SYNTHROID) 112 MCG tablet [Pharmacy Med Name: LEVOTHYROXINE 0.112MG  ( ) TABS] 90 tablet     Sig: TAKE 1 TABLET BY MOUTH  DAILY. TAKE WITH 125 MCG DOSE, FOR A TOTAL DOSE OF     Endocrinology:  Hypothyroid Agents Failed - 09/16/2023  7:43 PM      Failed - TSH in normal range and within 360 days    TSH  Date Value Ref Range Status  07/06/2023 11.900 (H) 0.450 - 4.500 uIU/mL Final         Passed - Valid encounter within last 12 months    Recent Outpatient Visits           2 months ago Type 2 diabetes mellitus with diabetic neuropathy, without long-term current use of insulin [facility])   [place] [facility] Merita Norton T, FNP   6 months ago Type 2 diabetes mellitus with diabetic neuropathy, without long-term current use of insulin [facility])   [place] [facility] Merita Norton T, FNP   8 months ago Pruritus   [place] Alfredia Ferguson, PA-C   9 months ago Type 2 diabetes mellitus with diabetic neuropathy, without long-term current use of insulin [facility])   [place] [facility] Mer[facility] Norton T, FNP   1 year ago Type 2 diabetes mellitus with diabetic neuropathy, without long-term current use of insulin (  [facility])   [place] [facility] Jacky Kindle, FNP       Future Appointments             In 2 weeks Jacky Kindle, FNP [facility], [facility]   In 11 months Marvel Plan, Elana Alm [facility] [place]

## 2023-09-20 DIAGNOSIS — R2689 Other abnormalities of gait and mobility: Secondary | ICD-10-CM | POA: Diagnosis not present

## 2023-09-20 DIAGNOSIS — I10 Essential (primary) hypertension: Secondary | ICD-10-CM | POA: Diagnosis not present

## 2023-10-06 ENCOUNTER — Other Ambulatory Visit: Payer: Self-pay | Admitting: Family Medicine

## 2023-10-06 ENCOUNTER — Encounter: Payer: Self-pay | Admitting: Family Medicine

## 2023-10-06 ENCOUNTER — Ambulatory Visit: Payer: BC Managed Care – PPO | Admitting: Family Medicine

## 2023-10-06 MED ORDER — SEMAGLUTIDE(0.25 OR 0.5MG/DOS) 2 MG/3ML ~~LOC~~ SOPN: 0.5000 mg | PEN_INJECTOR | SUBCUTANEOUS | 0 refills | Status: DC

## 2023-10-07 ENCOUNTER — Ambulatory Visit
Payer: BC Managed Care – PPO | Attending: Student in an Organized Health Care Education/Training Program | Admitting: Student in an Organized Health Care Education/Training Program

## 2023-10-07 ENCOUNTER — Encounter: Payer: Self-pay | Admitting: Student in an Organized Health Care Education/Training Program

## 2023-10-07 VITALS — BP 144/85 | HR 79 | Temp 97.2°F | Resp 16 | Ht 70.0 in | Wt 220.0 lb

## 2023-10-07 DIAGNOSIS — G894 Chronic pain syndrome: Secondary | ICD-10-CM | POA: Diagnosis not present

## 2023-10-07 DIAGNOSIS — M79671 Pain in right foot: Secondary | ICD-10-CM | POA: Diagnosis not present

## 2023-10-07 DIAGNOSIS — M79642 Pain in left hand: Secondary | ICD-10-CM | POA: Diagnosis not present

## 2023-10-07 DIAGNOSIS — M79672 Pain in left foot: Secondary | ICD-10-CM | POA: Diagnosis not present

## 2023-10-07 DIAGNOSIS — M4310 Spondylolisthesis, site unspecified: Secondary | ICD-10-CM | POA: Insufficient documentation

## 2023-10-07 DIAGNOSIS — M25561 Pain in right knee: Secondary | ICD-10-CM | POA: Diagnosis not present

## 2023-10-07 DIAGNOSIS — M25512 Pain in left shoulder: Secondary | ICD-10-CM | POA: Diagnosis not present

## 2023-10-07 DIAGNOSIS — Z79899 Other long term (current) drug therapy: Secondary | ICD-10-CM | POA: Insufficient documentation

## 2023-10-07 DIAGNOSIS — M545 Low back pain, unspecified: Secondary | ICD-10-CM | POA: Diagnosis not present

## 2023-10-07 DIAGNOSIS — M25562 Pain in left knee: Secondary | ICD-10-CM | POA: Diagnosis not present

## 2023-10-07 DIAGNOSIS — Z8781 Personal history of (healed) traumatic fracture: Secondary | ICD-10-CM | POA: Insufficient documentation

## 2023-10-07 DIAGNOSIS — M792 Neuralgia and neuritis, unspecified: Secondary | ICD-10-CM | POA: Insufficient documentation

## 2023-10-07 DIAGNOSIS — E1142 Type 2 diabetes mellitus with diabetic polyneuropathy: Secondary | ICD-10-CM | POA: Insufficient documentation

## 2023-10-07 DIAGNOSIS — M79641 Pain in right hand: Secondary | ICD-10-CM | POA: Insufficient documentation

## 2023-10-07 DIAGNOSIS — Z79891 Long term (current) use of opiate analgesic: Secondary | ICD-10-CM | POA: Insufficient documentation

## 2023-10-07 DIAGNOSIS — G8929 Other chronic pain: Secondary | ICD-10-CM | POA: Insufficient documentation

## 2023-10-07 DIAGNOSIS — M170 Bilateral primary osteoarthritis of knee: Secondary | ICD-10-CM | POA: Insufficient documentation

## 2023-10-07 DIAGNOSIS — G608 Other hereditary and idiopathic neuropathies: Secondary | ICD-10-CM | POA: Diagnosis not present

## 2023-10-07 DIAGNOSIS — Z7984 Long term (current) use of oral hypoglycemic drugs: Secondary | ICD-10-CM

## 2023-10-07 DIAGNOSIS — M4316 Spondylolisthesis, lumbar region: Secondary | ICD-10-CM

## 2023-10-07 MED ORDER — OXYCODONE-ACETAMINOPHEN 5-325 MG PO TABS: 1.0000 | ORAL_TABLET | Freq: Three times a day (TID) | ORAL | 0 refills | Status: DC

## 2023-10-07 MED ORDER — OXYCODONE-ACETAMINOPHEN 5-325 MG PO TABS
1.0000 | ORAL_TABLET | Freq: Three times a day (TID) | ORAL | 0 refills | Status: DC
Start: 2023-11-14 — End: 2024-01-06

## 2023-10-07 NOTE — Progress Notes (Signed)
PROVIDER NOTE: Information contained herein reflects review and annotations entered in association with encounter. Interpretation of such information and data should be left to medically-trained personnel. Information provided to patient can be located elsewhere in the medical record under "Patient Instructions". Document created using STT-dictation technology, any transcriptional errors that may result from process are unintentional.    Patient: Gabriella Marsh  Service Category: E/M  Provider: Edward Jolly, MD  DOB: 04/29/1966  DOS: 10/07/2023  Referring Provider: Jacky Kindle, FNP  MRN: [id number]  Specialty: Interventional Pain Management  PCP: Jacky Kindle, FNP  Type: Established Patient  Setting: Ambulatory outpatient    Location: Office  Delivery: Face-to-face     HPI  [name], a 57 y.o. year old female, is here today because of her Diabetic peripheral neuropathy (HCC) [E11.42]. [name] primary complain today is Foot Pain (Bilat neuropathy), Back Pain (upper), Shoulder Pain (right), and Hand Pain (bilat)  Pertinent problems: [name] has Diabetic peripheral neuropathy (HCC); RLS (restless legs syndrome); Chronic low back pain (2ry area of Pain) (Bilateral) (L>R) w/o sciatica; DDD (degenerative disc disease), lumbosacral; Disorder of peripheral nervous system; Chronic pain syndrome; Disorder of skeletal system; Problems influencing health status; Chronic hand pain (1ry area of Pain) (Bilateral) (L>R); Chronic feet pain (3ry area of Pain) (Bilateral) (L>R); Chronic shoulder pain (4th area of Pain) (Left); Chronic knee pain (5th area of Pain) (Bilateral) (L>R); Lumbar facet syndrome (Bilateral); Chronic sensorimotor polyneuropathy with axonal and demyelinating features; Osteoarthritis of knees (Bilateral); Tricompartment osteoarthritis of knees (Bilateral);  Grade 1 Retrolisthesis (9mm) of L5 over S1; and Chronic peripheral neuropathic pain on their pertinent problem  list. Pain Assessment: Severity of Chronic pain is reported as a 5 /10. Location: Foot Right, Left/denies. Onset: More than a month ago. Quality: Dull (sometimes at night it feels like pain shoots from heel to toes bilat). Timing:  . Modifying factor(s): Qutenza. Vitals:  height is 5\' 10"  (1.778 m) and weight is 220 lb (99.8 kg). Her temperature is 97.2 F (36.2 C) (abnormal). Her blood pressure is 144/85 (abnormal) and her pulse is 79. Her respiration is 16 and oxygen saturation is 99%.  BMI: Estimated body mass index is 31.57 kg/m as calculated from the following:   Height as of this encounter: 5\' 10"  (1.778 m).   Weight as of this encounter: 220 lb (99.8 kg). Last encounter: 07/06/2023. Last procedure: 08/02/2023.  Reason for encounter:    Patient presents today for medication management.  She is also experiencing increased right thoracic and subscapular pain.  She is in the process of transitioning to Medicare.  She would like to repeat Qutenza when that is done.  Pharmacotherapy Assessment  Analgesic: Percocet 5 mg TID as needed, quantity 12month   Monitoring: [place] PMP: PDMP not reviewed this encounter.       Pharmacotherapy: No side-effects or adverse reactions reported. Compliance: No problems identified. Effectiveness: Clinically acceptable.  Nonah Mattes, RN  10/07/2023  3:02 PM  Sign when Signing Visit Nursing Pain Medication Assessment:  Safety precautions to be maintained throughout the outpatient stay will include: orient to surroundings, keep bed in low position, maintain call bell within reach at all times, provide assistance with transfer out of bed and ambulation.  Medication Inspection Compliance: Pill count conducted under aseptic conditions, in front of the patient. Neither the pills nor the bottle was removed from the patient's sight at any time. Once count was completed pills were immediately returned to the patient in their original  bottle.  Medication:  Oxycodone/APAP Pill/Patch Count:  42 of 90 pills remain Pill/Patch Appearance: Markings consistent with prescribed medication Bottle Appearance: Standard pharmacy container. Clearly labeled. Filled Date: 45 / 26 / 2024 Last Medication intake:  Today  No results found for: "CBDTHCR" No results found for: "D8THCCBX" No results found for: "D9THCCBX"  UDS:  Summary  Date Value Ref Range Status  07/06/2023 Note  Final    Comment:    ==================================================================== [employer] [facility] 13 (MW) ==================================================================== Test                             Result       Flag       Units  Drug Present and Declared for Prescription Verification   Oxycodone                      614          EXPECTED   ng/mg creat   Oxymorphone                    223          EXPECTED   ng/mg creat   Noroxycodone                   1181         EXPECTED   ng/mg creat   Noroxymorphone                 273          EXPECTED   ng/mg creat    Sources of oxycodone are scheduled prescription medications.    Oxymorphone, noroxycodone, and noroxymorphone are expected    metabolites of oxycodone. Oxymorphone is also available as a    scheduled prescription medication.  ==================================================================== Test                      Result    Flag   Units      Ref Range   Creatinine              244              mg/dL      >=40 ==================================================================== Declared Medications:  The flagging and interpretation on this report are based on the  following declared medications.  Unexpected results may arise from  inaccuracies in the declared medications.   **Note: The testing scope of this panel includes these medications:   Oxycodone (Percocet)   **Note: The testing scope of this panel does not include the  following reported medications:   Acetaminophen (Tylenol)   Acetaminophen (Percocet)  Atorvastatin (Lipitor)  Clopidogrel (Plavix)  Cyclobenzaprine (Flexeril)  Fluticasone (Flonase)  Gabapentin  Insulin (Lantus)  Levothyroxine (Synthroid)  Lisinopril (Zestril)  Melatonin  Metformin  Nortriptyline (Pamelor)  Omeprazole (Prilosec)  Semaglutide  Trazodone (Desyrel) ==================================================================== For clinical consultation, please call [phone]. ====================================================================       ROS  Constitutional: Denies any fever or chills Gastrointestinal: No reported hemesis, hematochezia, vomiting, or acute GI distress Musculoskeletal:  Right thoracic and subscapular pain Neurological: No reported episodes of acute onset apraxia, aphasia, dysarthria, agnosia, amnesia, paralysis, loss of coordination, or loss of consciousness  Medication Review  Melatonin, Semaglutide(0.25 or 0.5MG /DOS), acetaminophen, atorvastatin, cyclobenzaprine, fluticasone, gabapentin, insulin glargine, levothyroxine, lisinopril, metFORMIN, nortriptyline, omeprazole, oxyCODONE-acetaminophen, and traZODone  History Review  Allergy: [name] is allergic to celecoxib, pregabalin, buspar [buspirone],  citalopram, and other. Drug: [name]  reports no history of drug use. Alcohol:  reports no history of alcohol use. Tobacco:  reports that she quit smoking about 11 years ago. Her smoking use included cigarettes. She has never used smokeless tobacco. Social: [name]  reports that she quit smoking about 11 years ago. Her smoking use included cigarettes. She has never used smokeless tobacco. She reports that she does not drink alcohol and does not use drugs. Medical:  has a past medical history of Arthritis, Blood clotting disorder (HCC), Degenerative disc disease, lumbar, Diabetes mellitus without complication (HCC), GERD (gastroesophageal reflux disease), Headache, Hyperlipidemia, Hypertension,  Neuropathy, and Vertigo. Surgical: [name]  has a past surgical history that includes Cesarean section; Shoulder surgery (Left, 12/26/014); Abdominal hysterectomy; Tonsillectomy and adenoidectomy; Uterine fibroid surgery; Ovarian cyst surgery; Cholecystectomy; left arm frature (10/12/2020); Thyroidectomy (N/A, 03/28/2021); Parathyroidectomy (03/28/2021); Amputation toe (Right, 07/24/2021); Colonoscopy with propofol (N/A, 02/20/2022); and right femur surgery (Right). Family: family history includes Congestive Heart Failure in her father; Diabetes in her father; Healthy in her brother; Heart disease in her father; Hyperlipidemia in her father; Hypertension in her father; Irritable bowel syndrome in her mother; Osteoporosis in her maternal grandmother and mother.  Laboratory Chemistry Profile   Renal Lab Results  Component Value Date   BUN 16 07/06/2023   CREATININE 0.50 (L) 07/06/2023   BCR 32 (H) 07/06/2023   GFR 111.18 06/05/2015   GFRAA 92 01/03/2021   GFRNONAA >60 01/09/2023    Hepatic Lab Results  Component Value Date   AST 17 07/06/2023   ALT 23 07/06/2023   ALBUMIN 4.1 07/06/2023   ALKPHOS 72 07/06/2023   LIPASE 47 05/04/2015    Electrolytes Lab Results  Component Value Date   NA 135 07/06/2023   K 4.4 07/06/2023   CL 96 07/06/2023   CALCIUM 8.8 07/06/2023   MG 1.8 01/09/2023    Bone Lab Results  Component Value Date   25OHVITD1 14 (L) 11/12/2021   25OHVITD2 <1.0 11/12/2021   25OHVITD3 13 11/12/2021    Inflammation (CRP: Acute Phase) (ESR: Chronic Phase) Lab Results  Component Value Date   CRP 4 11/12/2021   ESRSEDRATE 39 11/12/2021   LATICACIDVEN 2.9 (HH) 01/07/2023         Note: Above Lab results reviewed.  Recent Imaging Review  US Venous Img Lower Bilateral CLINICAL DATA:  pain  EXAM: BILATERAL LOWER EXTREMITY VENOUS DOPPLER ULTRASOUND  TECHNIQUE: Gray-scale sonography with graded compression, as well as color Doppler and duplex ultrasound were  performed to evaluate the lower extremity deep venous systems from the level of the common femoral vein and including the common femoral, femoral, profunda femoral, popliteal and calf veins including the posterior tibial, peroneal and gastrocnemius veins when visible. The superficial great saphenous vein was also interrogated. Spectral Doppler was utilized to evaluate flow at rest and with distal augmentation maneuvers in the common femoral, femoral and popliteal veins.  COMPARISON:  LEFT lower extremity venous duplex, 05/27/2022.  FINDINGS: RIGHT LOWER EXTREMITY  VENOUS  Normal compressibility of the RIGHT common femoral, superficial femoral, and popliteal veins, as well as the visualized calf veins. Visualized portions of profunda femoral vein and great saphenous vein unremarkable. No filling defects to suggest DVT on grayscale or color Doppler imaging. Doppler waveforms show normal direction of venous flow, normal respiratory plasticity and response to augmentation.  OTHER  No evidence of superficial thrombophlebitis or abnormal fluid collection.  Limitations: Patient body habitus  LEFT LOWER EXTREMITY  VENOUS  Normal compressibility of the LEFT common femoral, superficial femoral, and popliteal veins, as well as the visualized calf veins. Visualized portions of profunda femoral vein and great saphenous vein unremarkable. No filling defects to suggest DVT on grayscale or color Doppler imaging. Doppler waveforms show normal direction of venous flow, normal respiratory plasticity and response to augmentation.  OTHER  No evidence of superficial thrombophlebitis or abnormal fluid collection.  Limitations: Patient body habitus  IMPRESSION: No evidence of femoropopliteal DVT or superficial thrombophlebitis within either lower extremity.  Roanna Banning, MD  [facility]  [facility]  Electronically Signed   By: Roanna Banning M.D.   On: 01/07/2023 12:23 DG Chest Port 1 View CLINICAL DATA:  Hypotension.  EXAM: PORTABLE CHEST 1 VIEW  COMPARISON:  07/22/2021  FINDINGS: The lungs are clear without focal pneumonia, edema, pneumothorax or pleural effusion. The cardiopericardial silhouette is within normal limits for size. The visualized bony structures of the thorax are unremarkable. Telemetry leads overlie the chest.  IMPRESSION: No active disease.  Electronically Signed   By: Kennith Center M.D.   On: 01/07/2023 11:21  CLINICAL DATA:  Mid back pain. Left lower extremity weakness.   EXAM: MRI THORACIC SPINE WITHOUT CONTRAST   TECHNIQUE: Multiplanar, multisequence MR imaging of the thoracic spine was performed. No intravenous contrast was administered.   COMPARISON:  Thoracic spine radiographs 04/02/2020   FINDINGS: Alignment: No significant listhesis is present. Mild leftward curvature is again noted in the upper thoracic spine.   Vertebrae: 2 cm hemangioma is present at T8. 11 mm hemangioma is noted inferiorly at T10. Hemangiomas are present at T12. Marrow signal and vertebral body heights are otherwise normal.   Cord:  Normal signal and morphology.   Paraspinal and other soft tissues: A small right pleural effusion is present. Minimal atelectasis is present. Mediastinum is unremarkable. Paraspinous musculature demonstrates mild atrophy.   Disc levels:   Shallow central disc protrusions are present at C5-6 and T6-7 without significant stenosis. Facet hypertrophy contributes to moderate right and mild left foraminal narrowing at T9-10. Foramina are otherwise patent.   IMPRESSION: 1. Moderate right and mild left foraminal stenosis at T9-10 secondary to facet hypertrophy. 2. Shallow central disc protrusions at C5-6 and T6-7 without significant stenosis. 3. 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 4. Small right pleural effusion.  Note: Reviewed        Physical Exam   General appearance: Well nourished, well developed, and well hydrated. In no apparent acute distress Mental status: Alert, oriented x 3 (person, place, & time)       Respiratory: No evidence of acute respiratory distress Eyes: PERLA Vitals: BP (!) 144/85   Pulse 79   Temp (!) 97.2 F (36.2 C)   Resp 16   Ht 5\' 10"  (1.778 m)   Wt 220 lb (99.8 kg)   SpO2 99%   BMI 31.57 kg/m  BMI: Estimated body mass index is 31.57 kg/m as calculated from the following:   Height as of this encounter: 5\' 10"  (1.778 m).   Weight as of this encounter: 220 lb (99.8 kg). Ideal: Ideal body weight: 68.5 kg (151 lb 0.2 oz) Adjusted ideal body weight: 81 kg (178 lb 9.7 oz)  Thoracic Spine Area Exam  Skin & Axial Inspection: No masses, redness, or swelling Alignment: Symmetrical Functional ROM: Pain restricted ROM Stability: No instability detected Muscle Tone/Strength: Functionally intact. No obvious neuro-muscular anomalies detected. Sensory (Neurological): Musculoskeletal pain pattern and neurogenic Muscle strength & Tone: No  palpable anomalies  Assessment   Diagnosis Status  1. Diabetic peripheral neuropathy (HCC)   2. Chronic peripheral neuropathic pain   3. Chronic sensorimotor polyneuropathy with axonal and demyelinating features   4. History of proximal humerus fracture (Left)   5. Type 2 diabetes mellitus with peripheral neuropathy (HCC)   6. Chronic pain syndrome   7. Osteoarthritis of knees (Bilateral)   8. Pharmacologic therapy   9. Chronic shoulder pain (4th area of Pain) (Left)   10.  Grade 1 Retrolisthesis (9mm) of L5 over S1   11. Chronic hand pain (1ry area of Pain) (Bilateral) (L>R)   12. Encounter for medication management   13. Encounter for chronic pain management   14. Chronic use of opiate for therapeutic purpose   15. Chronic knee pain (5th area of Pain) (Bilateral) (L>R)   16. Tricompartment osteoarthritis of knees (Bilateral)   17. Chronic low back pain (2ry area of  Pain) (Bilateral) (L>R) w/o sciatica   18. Chronic feet pain (3ry area of Pain) (Bilateral) (L>R)    Controlled Controlled Controlled     Plan of Care    [name] has a current medication list which includes the following long-term medication(s): atorvastatin, fluticasone, gabapentin, gabapentin, lantus solostar, levothyroxine, levothyroxine, lisinopril, metformin, nortriptyline, omeprazole, trazodone, [START ON 10/15/2023] oxycodone-acetaminophen, [START ON 11/14/2023] oxycodone-acetaminophen, and [START ON 12/14/2023] oxycodone-acetaminophen.  I reviewed thoracic MRI with patient.  She does have moderate right foraminal stenosis at T9-T10 and facet hypertrophy.  We discussed a right thoracic facet medial branch nerve block for her pain.  She states that she would like to hold off until she transitions to Medicare.  We also discussed repeating Qutenza in the future.  Refill of oxycodone as below, no change in dose, UDS up-to-date and appropriate.   Pharmacotherapy (Medications Ordered): Meds ordered this encounter  Medications   oxyCODONE-acetaminophen (PERCOCET) 5-325 MG tablet    Sig: Take 1 tablet by mouth every 8 (eight) hours. Must last 30 days.    Dispense:  90 tablet    Refill:  0    DO NOT: delete (not duplicate); no partial-fill (will deny script to complete), no refill request (F/U required). DISPENSE: 1 day early if closed on fill date. WARN: No CNS-depressants within 8 hrs of med.   oxyCODONE-acetaminophen (PERCOCET) 5-325 MG tablet    Sig: Take 1 tablet by mouth every 8 (eight) hours. Must last 30 days.    Dispense:  90 tablet    Refill:  0    DO NOT: delete (not duplicate); no partial-fill (will deny script to complete), no refill request (F/U required). DISPENSE: 1 day early if closed on fill date. WARN: No CNS-depressants within 8 hrs of med.   oxyCODONE-acetaminophen (PERCOCET) 5-325 MG tablet    Sig: Take 1 tablet by mouth every 8 (eight) hours.  Must last 30 days.    Dispense:  90 tablet    Refill:  0    DO NOT: delete (not duplicate); no partial-fill (will deny script to complete), no refill request (F/U required). DISPENSE: 1 day early if closed on fill date. WARN: No CNS-depressants within 8 hrs of med.   Orders:  No orders of the defined types were placed in this encounter.  Follow-up plan:   Return in about 3 months (around 01/07/2024) for MM, F2F.     Recent Visits Date Type Provider Dept  08/02/23 Procedure visit Edward Jolly, MD [facility]  Showing recent visits within past 90 days and  meeting all other requirements Today's Visits Date Type Provider Dept  10/07/23 Office Visit Edward Jolly, MD [facility]  Showing today's visits and meeting all other requirements Future Appointments No visits were found meeting these conditions. Showing future appointments within next 90 days and meeting all other requirements  I discussed the assessment and treatment plan with the patient. The patient was provided an opportunity to ask questions and all were answered. The patient agreed with the plan and demonstrated an understanding of the instructions.  Patient advised to call back or seek an in-person evaluation if the symptoms or condition worsens.  Duration of encounter: .  Total time on encounter, as per AMA guidelines included both the face-to-face and non-face-to-face time personally spent by the physician and/or other qualified health care professional(s) on the day of the encounter (includes time in activities that require the physician or other qualified health care professional and does not include time in activities normally performed by clinical staff). Physician's time may include the following activities when performed: Preparing to see the patient (e.g., pre-charting review of records, searching for previously ordered imaging, lab work, and nerve conduction tests) Review of prior  analgesic pharmacotherapies. Reviewing PMP Interpreting ordered tests (e.g., lab work, imaging, nerve conduction tests) Performing post-procedure evaluations, including interpretation of diagnostic procedures Obtaining and/or reviewing separately obtained history Performing a medically appropriate examination and/or evaluation Counseling and educating the patient/family/caregiver Ordering medications, tests, or procedures Referring and communicating with other health care professionals (when not separately reported) Documenting clinical information in the electronic or other health record Independently interpreting results (not separately reported) and communicating results to the patient/ family/caregiver Care coordination (not separately reported)  Note by: Edward Jolly, MD Date: 10/07/2023; Time: 3:32 PM

## 2023-10-07 NOTE — Progress Notes (Signed)
Nursing Pain Medication Assessment:  Safety precautions to be maintained throughout the outpatient stay will include: orient to surroundings, keep bed in low position, maintain call bell within reach at all times, provide assistance with transfer out of bed and ambulation.  Medication Inspection Compliance: Pill count conducted under aseptic conditions, in front of the patient. Neither the pills nor the bottle was removed from the patient's sight at any time. Once count was completed pills were immediately returned to the patient in their original bottle.  Medication: Oxycodone/APAP Pill/Patch Count:  42 of 90 pills remain Pill/Patch Appearance: Markings consistent with prescribed medication Bottle Appearance: Standard pharmacy container. Clearly labeled. Filled Date: 41 / 26 / 2024 Last Medication intake:  Today

## 2023-10-11 ENCOUNTER — Encounter: Payer: Self-pay | Admitting: Family Medicine

## 2023-10-11 ENCOUNTER — Ambulatory Visit (INDEPENDENT_AMBULATORY_CARE_PROVIDER_SITE_OTHER): Payer: BC Managed Care – PPO | Admitting: Family Medicine

## 2023-10-11 VITALS — BP 168/83 | HR 84 | Wt 220.0 lb

## 2023-10-11 DIAGNOSIS — E1159 Type 2 diabetes mellitus with other circulatory complications: Secondary | ICD-10-CM

## 2023-10-11 DIAGNOSIS — E1165 Type 2 diabetes mellitus with hyperglycemia: Secondary | ICD-10-CM

## 2023-10-11 DIAGNOSIS — E1140 Type 2 diabetes mellitus with diabetic neuropathy, unspecified: Secondary | ICD-10-CM | POA: Diagnosis not present

## 2023-10-11 DIAGNOSIS — E039 Hypothyroidism, unspecified: Secondary | ICD-10-CM | POA: Diagnosis not present

## 2023-10-11 DIAGNOSIS — Z23 Encounter for immunization: Secondary | ICD-10-CM

## 2023-10-11 DIAGNOSIS — Z794 Long term (current) use of insulin: Secondary | ICD-10-CM

## 2023-10-11 DIAGNOSIS — I152 Hypertension secondary to endocrine disorders: Secondary | ICD-10-CM

## 2023-10-11 NOTE — Patient Instructions (Signed)
The CDC recommends two doses of Shingrix (the new shingles vaccine) separated by 2 to 6 months for adults age 56 years and older. I recommend checking with your insurance plan regarding coverage for this vaccine.    **Please schedule your diabetic eye exam**

## 2023-10-11 NOTE — Progress Notes (Signed)
Established patient visit   Patient: Gabriella Marsh   DOB: 1966-01-14   57 y.o. Female  MRN: [id number] Visit Date: 10/11/2023  Today's healthcare provider: Jacky Kindle, FNP  Introduced to nurse practitioner role and practice setting.  All questions answered.  Discussed provider/patient relationship and expectations.  Subjective    HPI HPI     Medical Management of Chronic Issues    Additional comments: 3 month follow-up      Last edited by Acey Lav, CMA on 10/11/2023  8:19 AM.     The patient, with a history of diabetes, hypothyroidism, and hypertension, presents with concerns about her medication regimen. She reports being without her Ozempic, a medication for diabetes, due to insurance denial. The patient expresses frustration with the communication process regarding this issue and the subsequent denial. She also mentions being out of her thyroid medication and is unsure of her current thyroid levels. The patient also reports occasional swelling in her feet, despite her blood pressure being "low".  In addition to these primary concerns, the patient mentions several other issues. She reports difficulty with mobility and expresses concern about the durability of her current wheelchair. She also mentions that she has recently been approved for disability and will be transitioning to Medicare soon. The patient also reports that she is due for an eye exam and is planning to get her shingles vaccine.  Medications: Outpatient Medications Prior to Visit  Medication Sig   acetaminophen (TYLENOL) 500 MG tablet Take 1,000 mg by mouth every 12 (twelve) hours as needed for moderate pain.   atorvastatin (LIPITOR) 20 MG tablet TAKE 1 TABLET(20 MG) BY MOUTH DAILY   cyclobenzaprine (FLEXERIL) 5 MG tablet Take 1 tablet (5 mg total) by mouth 3 (three) times daily as needed for muscle spasms.   fluticasone (FLONASE) 50 MCG/ACT nasal spray Place 2 sprays into both nostrils daily.    gabapentin (NEURONTIN) 100 MG capsule TAKE 2 CAPSULES THREE TIMES A DAY ALONG WITH 600 MG THREE TIMES A DAY FOR A TOTAL DAILY DOSE OF 2400 MG DAILY   gabapentin (NEURONTIN) 600 MG tablet Take 1 tablet (600 mg total) by mouth 3 (three) times daily.   insulin glargine (LANTUS SOLOSTAR) 100 UNIT/ML Solostar Pen Inject 24 Units into the skin at bedtime.   levothyroxine (SYNTHROID) 112 MCG tablet Take 1 tablet (112 mcg total) by mouth daily. Take with 125 mcg for dose of 237; replaces 200+25 dosing given TSH >11   levothyroxine (SYNTHROID) 125 MCG tablet Take 1 tablet (125 mcg total) by mouth daily. Take with 112 mcg for dose of 237; replaces 200+25 dosing given TSH >11   lisinopril (ZESTRIL) 2.5 MG tablet Take 1 tablet (2.5 mg total) by mouth daily.   Melatonin 10 MG TABS Take 10 mg by mouth at bedtime.   metFORMIN (GLUCOPHAGE) 500 MG tablet Take 2 tablets (1,000 mg total) by mouth 2 (two) times daily with a meal.   nortriptyline (PAMELOR) 50 MG capsule Take 1 capsule (50 mg total) by mouth at bedtime.   omeprazole (PRILOSEC) 20 MG capsule Take 1 capsule (20 mg total) by mouth daily. TAKE 1 CAPSULE DAILY   [START ON 10/15/2023] oxyCODONE-acetaminophen (PERCOCET) 5-325 MG tablet Take 1 tablet by mouth every 8 (eight) hours. Must last 30 days.   [START ON 11/14/2023] oxyCODONE-acetaminophen (PERCOCET) 5-325 MG tablet Take 1 tablet by mouth every 8 (eight) hours. Must last 30 days.   [START ON 12/14/2023] oxyCODONE-acetaminophen (PERCOCET) 5-325 MG tablet Take 1  tablet by mouth every 8 (eight) hours. Must last 30 days.   Semaglutide,0.25 or 0.5MG /DOS, 2 MG/3ML SOPN Inject 0.5 mg into the skin as directed.   traZODone (DESYREL) 100 MG tablet Take 1 tablet (100 mg total) by mouth at bedtime.   No facility-administered medications prior to visit.    Review of Systems  Last CBC Lab Results  Component Value Date   WBC 9.8 07/06/2023   HGB 13.0 07/06/2023   HCT 39.0 07/06/2023   MCV 85 07/06/2023    MCH 28.2 07/06/2023   RDW 13.1 07/06/2023   PLT 254 07/06/2023   Last metabolic panel Lab Results  Component Value Date   GLUCOSE 181 (H) 07/06/2023   NA 135 07/06/2023   K 4.4 07/06/2023   CL 96 07/06/2023   CO2 20 07/06/2023   BUN 16 07/06/2023   CREATININE 0.50 (L) 07/06/2023   EGFR 110 07/06/2023   CALCIUM 8.8 07/06/2023   PROT 6.5 07/06/2023   ALBUMIN 4.1 07/06/2023   LABGLOB 2.4 07/06/2023   AGRATIO 1.7 04/24/2022   BILITOT 1.0 07/06/2023   ALKPHOS 72 07/06/2023   AST 17 07/06/2023   ALT 23 07/06/2023   ANIONGAP 10 01/09/2023   Last lipids Lab Results  Component Value Date   CHOL 132 07/06/2023   HDL 35 (L) 07/06/2023   LDLCALC 40 07/06/2023   TRIG 386 (H) 07/06/2023   CHOLHDL 3.8 07/06/2023   Last hemoglobin A1c Lab Results  Component Value Date   HGBA1C 9.2 (A) 07/06/2023   Last thyroid functions Lab Results  Component Value Date   TSH 11.900 (H) 07/06/2023   Last vitamin D Lab Results  Component Value Date   25OHVITD2 <1.0 11/12/2021   25OHVITD3 13 11/12/2021   Last vitamin B12 and Folate Lab Results  Component Value Date   VITAMINB12 300 11/12/2021     Objective    There were no vitals taken for this visit. BP Readings from Last 3 Encounters:  10/07/23 (!) 144/85  09/09/23 105/69  08/02/23 138/88   Wt Readings from Last 3 Encounters:  10/07/23 220 lb (99.8 kg)  08/02/23 220 lb (99.8 kg)  07/06/23 225 lb (102.1 kg)   SpO2 Readings from Last 3 Encounters:  10/07/23 99%  08/02/23 99%  07/06/23 99%   Physical Exam Vitals and nursing note reviewed.  Constitutional:      General: She is not in acute distress.    Appearance: Normal appearance. She is obese. She is not ill-appearing, toxic-appearing or diaphoretic.  HENT:     Head: Normocephalic and atraumatic.  Cardiovascular:     Rate and Rhythm: Normal rate and regular rhythm.     Pulses: Normal pulses.     Heart sounds: Normal heart sounds. No murmur heard.    No friction rub.  No gallop.  Pulmonary:     Effort: Pulmonary effort is normal. No respiratory distress.     Breath sounds: Normal breath sounds. No stridor. No wheezing, rhonchi or rales.  Chest:     Chest wall: No tenderness.  Musculoskeletal:        General: No swelling, tenderness, deformity or signs of injury. Normal range of motion.     Right lower leg: No edema.     Left lower leg: No edema.  Skin:    General: Skin is warm and dry.     Capillary Refill: Capillary refill takes less than 2 seconds.     Coloration: Skin is not jaundiced or pale.  Findings: No bruising, erythema, lesion or rash.  Neurological:     Mental Status: She is alert and oriented to person, place, and time.     Cranial Nerves: No cranial nerve deficit.     Sensory: No sensory deficit.     Motor: Weakness present.     Coordination: Coordination normal.     Comments: Patient reports inability to stand without assistance; remains w/c bound  Psychiatric:        Mood and Affect: Mood normal. Affect is blunt.        Behavior: Behavior normal.        Thought Content: Thought content normal.        Judgment: Judgment normal.     No results found for any visits on 10/11/23.  Assessment & Plan     Problem List Items Addressed This Visit       Cardiovascular and Mediastinum   Hypertension associated with diabetes (HCC)   Other Visit Diagnoses     Type 2 diabetes mellitus with hyperglycemia, with long-term current use of insulin (HCC)    -  Primary   Type 2 diabetes mellitus with diabetic neuropathy, without long-term current use of insulin (HCC)         Diabetes Mellitus Chronic, Uncontrolled Type 2  Patient has been without Ozempic due to insurance denial and inconsistent dosing. Currently only on Lantus. Blood sugars likely elevated. Patient reports she does not check her blood sugar due to neuropathy as checking her blood sugar causes too much pain -Resolve insurance issue and reinstate Ozempic at appropriate  dose. -Check A1c today. -Continue lantus   Hypothyroidism Patient on two different strengths of thyroid medication- alternating between 112/125 mcg- due to history of thyroidectomy. Last TSH was elevated at ~12 in July 2024; however, patient did not present for additional labs due to transportation issues. -Check TSH today. -Refill thyroid medication as labs indicate.  Chronic, currently uncontrolled Hypertension in association with T2DM Blood pressure elevated at today's visit, possibly due to stress. Patient reports occasional swelling in feet. -Advise patient to take blood pressure medication as prescribed at not at her discretion; Lisinopril 2.5 is designed to assist with kidney function primarily and is not a blood pressure dose. -Encourage home in chair physical therapy to help with edema due to LE dependence. -goal of <129/<79  Acute on Chronic, Back Pain Patient reports arthritis in the thoracic vertebrae causing pain. Plans for nerve block injection at later date by chronic pain specialist. -No changes to current pain management plan.  General Health Maintenance -Administer influenza and shingles vaccines today. -Encourage patient to continue with eye exam today. -Advise patient to schedule mammogram and Pap smear when able. -Order urine test for kidney function today. -Schedule follow-up visit in three months.  Leilani Merl, FNP, have reviewed all documentation for this visit. The documentation on 10/11/23 for the exam, diagnosis, procedures, and orders are all accurate and complete.  Jacky Kindle, FNP  [facility] [phone] (phone) [phone] (fax)  [facility]

## 2023-10-12 ENCOUNTER — Other Ambulatory Visit: Payer: Self-pay | Admitting: Family Medicine

## 2023-10-12 LAB — HEMOGLOBIN A1C
Est. average glucose Bld gHb Est-mCnc: 214 mg/dL
Hgb A1c MFr Bld: 9.1 % — ABNORMAL HIGH (ref 4.8–5.6)

## 2023-10-12 LAB — TSH: TSH: 1.69 u[IU]/mL (ref 0.450–4.500)

## 2023-10-12 MED ORDER — LEVOTHYROXINE SODIUM 112 MCG PO TABS: 224.0000 ug | ORAL_TABLET | Freq: Every day | ORAL | 3 refills | Status: DC

## 2023-10-12 NOTE — Progress Notes (Signed)
Appears the patient is transferring PCP... she did not disclose this at her appt yesterday.  Her diabetes remains uncontrolled; recommend titration up from ozempic after PA is approved and use of CGM to assist with nightly lantus titration and start of mealtime insulin if desired.  TSH remains stable; refills sent to mail order.

## 2023-10-13 ENCOUNTER — Encounter: Payer: Self-pay | Admitting: Family Medicine

## 2023-10-15 ENCOUNTER — Telehealth: Payer: Self-pay

## 2023-10-15 NOTE — Telephone Encounter (Signed)
DME for patient Gabriella Marsh, DOB: 2066-01-17.  Prescription shows the patient's injecting insulin 4x day, but medical record states only 1x day.  Can you please update the medical record or send a new prescription?  [employer] [place] will not cover if patient is only injecting insulin once daily. This is for the Dexcom  [phone]

## 2023-10-22 ENCOUNTER — Telehealth: Payer: Self-pay | Admitting: Family Medicine

## 2023-10-22 ENCOUNTER — Other Ambulatory Visit: Payer: Self-pay | Admitting: Family Medicine

## 2023-10-22 NOTE — Telephone Encounter (Signed)
Kim with [employer] is calling in checking on the status of prescription request and a medical record addendum. Selena Batten can be reached at [phone]. Please follow up with Selena Batten to let her know if the form was received.

## 2023-10-26 NOTE — Telephone Encounter (Signed)
DME for patient Gabriella Marsh, DOB: 2066-01-04.  Prescription shows the patient's injecting insulin 4x day, but medical record states only 1x day.  Can you please update the medical record or send a new prescription?  [employer] [place] will not cover if patient is only injecting insulin once daily. This is for the Dexcom   [phone]  Please update medical record on how patient injecting insulin. This is foe her Dexcom to be approved. Notes that were sent were from 10/11/2023 or does patient needs to come in to another appointment?

## 2023-10-27 NOTE — Telephone Encounter (Signed)
On schedule for video visit tomorrow at 840

## 2023-10-28 ENCOUNTER — Encounter: Payer: Self-pay | Admitting: Family Medicine

## 2023-10-28 ENCOUNTER — Telehealth: Payer: BC Managed Care – PPO | Admitting: Family Medicine

## 2023-10-28 VITALS — BP 119/74

## 2023-10-28 DIAGNOSIS — I152 Hypertension secondary to endocrine disorders: Secondary | ICD-10-CM

## 2023-10-28 DIAGNOSIS — Z794 Long term (current) use of insulin: Secondary | ICD-10-CM

## 2023-10-28 DIAGNOSIS — E1159 Type 2 diabetes mellitus with other circulatory complications: Secondary | ICD-10-CM

## 2023-10-28 DIAGNOSIS — E113592 Type 2 diabetes mellitus with proliferative diabetic retinopathy without macular edema, left eye: Secondary | ICD-10-CM | POA: Diagnosis not present

## 2023-10-28 DIAGNOSIS — Z7985 Long-term (current) use of injectable non-insulin antidiabetic drugs: Secondary | ICD-10-CM

## 2023-10-28 DIAGNOSIS — E1142 Type 2 diabetes mellitus with diabetic polyneuropathy: Secondary | ICD-10-CM | POA: Diagnosis not present

## 2023-10-28 DIAGNOSIS — E1140 Type 2 diabetes mellitus with diabetic neuropathy, unspecified: Secondary | ICD-10-CM | POA: Diagnosis not present

## 2023-10-28 LAB — HM DIABETES EYE EXAM

## 2023-10-28 MED ORDER — INSULIN PEN NEEDLE 30G X 5 MM MISC: 1.0000 | Freq: Four times a day (QID) | 0 refills | Status: DC

## 2023-10-28 MED ORDER — INSULIN ASPART 100 UNIT/ML FLEXPEN: 4.0000 [IU] | PEN_INJECTOR | Freq: Three times a day (TID) | SUBCUTANEOUS | 0 refills | Status: DC

## 2023-10-28 NOTE — Assessment & Plan Note (Addendum)
Poorly controlled - A1c of 9.1 on 10/11/2023.  Patient has difficulty with self-monitoring of blood glucose due to neuropathy and associated pain with finger pricks. Patient was off Ozempic for a month due to insurance issues (prior to her October visit) but has resumed it along with nightly Lantus.  I placed a call to [employer] to determine what was needed for the patient to receive the Dexcom CGM; the representative will be faxing over a form to clarify whether the patient needs a reader or sensor.  We will fax this back with a copy of today's visit. Discussed the need for mealtime insulin, which patient is agreeable to; prescribed as noted. Ordered uACR to assess for diabetic nephropathy. Encouraged patient to continue with dietary modifications and chair exercises.

## 2023-10-28 NOTE — Progress Notes (Signed)
MyChart Video Visit    Virtual Visit via Video Note   This format is felt to be most appropriate for this patient at this time. Physical exam was limited by quality of the video and audio technology used for the visit.   Patient location: Home Provider location: [facility]  I discussed the limitations of evaluation and management by telemedicine and the availability of in person appointments. The patient expressed understanding and agreed to proceed.  Patient: Gabriella Marsh   DOB: 08-Nov-1966   57 y.o. Female  MRN: [id number] Visit Date: 10/28/2023  Today's healthcare provider: Sherlyn Hay, DO   No chief complaint on file.  Subjective    HPI  Discussed the use of AI scribe software for clinical note transcription with the patient, who gave verbal consent to proceed.  History of Present Illness   The patient, with a history of diabetes, neuropathy, and hypertension, presents with difficulty in self-monitoring blood glucose due to neuropathic pain in the hands and fingers. The neuropathy causes overactive nerves leading to significant pain and necessitating the patient wear gloves constantly. The pain is so severe that even the thought of pricking the finger for glucose testing causes distress.  The patient's diabetes management has been complicated by an almost month-long gap in taking Ozempic due to insurance issues, although she has resumed taking it once a week for the past three weeks. She continues to take Lantus every night. The patient does not currently self-monitor blood glucose due to the aforementioned neuropathic pain.  The patient's hypertension appears to be well-controlled, with a recent home reading of 119/74. She has been making dietary changes to manage her diabetes, including reducing starch intake and limiting consumption of ice cream (sugar free) to once a week. The patient is wheelchair-bound and unable to get up independently, but is  engaged in therapy once a week for her legs and does some upper body exercises at home.  The patient also reports a recent finding of bleeding behind the eyes, which she attributes to high blood sugars, and is scheduled to see another ophthalmologist for further evaluation.       Medications: Outpatient Medications Prior to Visit  Medication Sig   acetaminophen (TYLENOL) 500 MG tablet Take 1,000 mg by mouth every 12 (twelve) hours as needed for moderate pain.   atorvastatin (LIPITOR) 20 MG tablet TAKE 1 TABLET(20 MG) BY MOUTH DAILY   cyclobenzaprine (FLEXERIL) 5 MG tablet Take 1 tablet (5 mg total) by mouth 3 (three) times daily as needed for muscle spasms.   fluticasone (FLONASE) 50 MCG/ACT nasal spray Place 2 sprays into both nostrils daily.   gabapentin (NEURONTIN) 100 MG capsule TAKE 2 CAPSULES THREE TIMES A DAY ALONG WITH 600 MG THREE TIMES A DAY FOR A TOTAL DAILY DOSE OF 2400 MG DAILY   gabapentin (NEURONTIN) 600 MG tablet Take 1 tablet (600 mg total) by mouth 3 (three) times daily.   insulin glargine (LANTUS SOLOSTAR) 100 UNIT/ML Solostar Pen Inject 24 Units into the skin at bedtime.   levothyroxine (SYNTHROID) 112 MCG tablet Take 2 tablets (224 mcg total) by mouth daily.   lisinopril (ZESTRIL) 2.5 MG tablet Take 1 tablet (2.5 mg total) by mouth daily.   Melatonin 10 MG TABS Take 10 mg by mouth at bedtime.   metFORMIN (GLUCOPHAGE) 500 MG tablet Take 2 tablets (1,000 mg total) by mouth 2 (two) times daily with a meal.   nortriptyline (PAMELOR) 50 MG capsule Take 1 capsule (  50 mg total) by mouth at bedtime.   omeprazole (PRILOSEC) 20 MG capsule Take 1 capsule (20 mg total) by mouth daily. TAKE 1 CAPSULE DAILY   oxyCODONE-acetaminophen (PERCOCET) 5-325 MG tablet Take 1 tablet by mouth every 8 (eight) hours. Must last 30 days.   [START ON 11/14/2023] oxyCODONE-acetaminophen (PERCOCET) 5-325 MG tablet Take 1 tablet by mouth every 8 (eight) hours. Must last 30 days.   [START ON 12/14/2023]  oxyCODONE-acetaminophen (PERCOCET) 5-325 MG tablet Take 1 tablet by mouth every 8 (eight) hours. Must last 30 days.   Semaglutide,0.25 or 0.5MG /DOS, 2 MG/3ML SOPN Inject 0.5 mg into the skin as directed.   traZODone (DESYREL) 100 MG tablet Take 1 tablet (100 mg total) by mouth at bedtime.   No facility-administered medications prior to visit.        Objective    BP 119/74 Comment: pt home BP reading checked this morning      Physical Exam Constitutional:      General: She is not in acute distress.    Appearance: Normal appearance.  HENT:     Head: Normocephalic.  Pulmonary:     Effort: Pulmonary effort is normal. No respiratory distress.  Skin:    Comments: Wearing gloves over her hands  Neurological:     Mental Status: She is alert and oriented to person, place, and time. Mental status is at baseline.       Assessment & Plan    Type 2 diabetes mellitus with diabetic neuropathy, with long-term current use of insulin (HCC) Assessment & Plan: Poorly controlled - A1c of 9.1 on 10/11/2023.  Patient has difficulty with self-monitoring of blood glucose due to neuropathy and associated pain with finger pricks. Patient was off Ozempic for a month due to insurance issues (prior to her October visit) but has resumed it along with nightly Lantus.  I placed a call to [employer] to determine what was needed for the patient to receive the Dexcom CGM; the representative will be faxing over a form to clarify whether the patient needs a reader or sensor.  We will fax this back with a copy of today's visit. Discussed the need for mealtime insulin, which patient is agreeable to; prescribed as noted. Ordered uACR to assess for diabetic nephropathy. Encouraged patient to continue with dietary modifications and chair exercises.  Orders: -     Microalbumin / creatinine urine ratio  Diabetic peripheral neuropathy (HCC) Assessment & Plan: Severe, causing patient to wear gloves constantly due to  overactive nerves and associated pain. Also has arthritis and possible carpal tunnel syndrome. -Continue current management with neurologist and pain doctor.    Hypertension associated with diabetes [facility]) Assessment & Plan: Well controlled, with home readings within normal range. -Continue lisinopril 2.5 mg daily    Return in about 1 month (around 11/27/2023) for DM.     I discussed the assessment and treatment plan with the patient. The patient was provided an opportunity to ask questions and all were answered. The patient agreed with the plan and demonstrated an understanding of the instructions.   The patient was advised to call back or seek an in-person evaluation if the symptoms worsen or if the condition fails to improve as anticipated.  I provided 25 minutes of virtual-face-to-face time during this encounter.   Sherlyn Hay, DO [facility] [phone] (phone) [phone] (fax)  [facility]

## 2023-10-28 NOTE — Assessment & Plan Note (Signed)
Severe, causing patient to wear gloves constantly due to overactive nerves and associated pain. Also has arthritis and possible carpal tunnel syndrome. -Continue current management with neurologist and pain doctor.

## 2023-10-28 NOTE — Assessment & Plan Note (Signed)
Well controlled, with home readings within normal range. -Continue lisinopril 2.5 mg daily

## 2023-10-28 NOTE — Telephone Encounter (Signed)
Spoke with a Occupational psychologist at [facility] who informed me that the concern with the prescription was that it did not specify whether the patient needed the reader or the sensor for the Dexcom.  She also advised me that we will need to send a copy of her most recent visit as documentation of multiple times per day insulin dosing.  She will be re-faxing the form to clarify the Dexcom order.

## 2023-11-01 ENCOUNTER — Encounter: Payer: Self-pay | Admitting: Family Medicine

## 2023-11-01 DIAGNOSIS — E1140 Type 2 diabetes mellitus with diabetic neuropathy, unspecified: Secondary | ICD-10-CM

## 2023-11-04 MED ORDER — SEMAGLUTIDE(0.25 OR 0.5MG/DOS) 2 MG/3ML ~~LOC~~ SOPN: 0.5000 mg | PEN_INJECTOR | SUBCUTANEOUS | 0 refills | Status: DC

## 2023-11-09 DIAGNOSIS — E1165 Type 2 diabetes mellitus with hyperglycemia: Secondary | ICD-10-CM | POA: Diagnosis not present

## 2023-11-29 DIAGNOSIS — E113592 Type 2 diabetes mellitus with proliferative diabetic retinopathy without macular edema, left eye: Secondary | ICD-10-CM | POA: Diagnosis not present

## 2023-11-29 DIAGNOSIS — H2513 Age-related nuclear cataract, bilateral: Secondary | ICD-10-CM | POA: Diagnosis not present

## 2023-11-29 DIAGNOSIS — E113491 Type 2 diabetes mellitus with severe nonproliferative diabetic retinopathy without macular edema, right eye: Secondary | ICD-10-CM | POA: Diagnosis not present

## 2023-12-28 ENCOUNTER — Ambulatory Visit: Payer: BC Managed Care – PPO

## 2023-12-29 ENCOUNTER — Ambulatory Visit: Payer: BC Managed Care – PPO | Attending: Orthopedic Surgery

## 2023-12-29 DIAGNOSIS — M5459 Other low back pain: Secondary | ICD-10-CM | POA: Diagnosis not present

## 2023-12-29 DIAGNOSIS — M6281 Muscle weakness (generalized): Secondary | ICD-10-CM | POA: Insufficient documentation

## 2023-12-29 DIAGNOSIS — R262 Difficulty in walking, not elsewhere classified: Secondary | ICD-10-CM | POA: Diagnosis not present

## 2023-12-29 DIAGNOSIS — R2689 Other abnormalities of gait and mobility: Secondary | ICD-10-CM | POA: Insufficient documentation

## 2023-12-29 DIAGNOSIS — R2681 Unsteadiness on feet: Secondary | ICD-10-CM | POA: Insufficient documentation

## 2023-12-29 NOTE — Therapy (Signed)
 OUTPATIENT PHYSICAL THERAPY NEURO EVALUATION   Patient Name: Gabriella Marsh MRN: [id number] DOB:1966/10/23, 58 y.o., female Today's Date: 12/30/2023   PCP: Donzella Lauraine SAILOR, DO REFERRING PROVIDER: Laurence Prentice DASEN, MD  END OF SESSION:  PT End of Session - 12/29/23 1712     Visit Number 1    Number of Visits 25    Date for PT Re-Evaluation 03/22/24    PT Start Time 1616    PT Stop Time 1701    PT Time Calculation (min) 45 min    Equipment Utilized During Treatment Gait belt    Activity Tolerance Patient tolerated treatment well    Behavior During Therapy WFL for tasks assessed/performed             Past Medical History:  Diagnosis Date   Arthritis    knees   Blood clotting disorder (HCC)    on toe    Degenerative disc disease, lumbar    Diabetes mellitus without complication (HCC)    GERD (gastroesophageal reflux disease)    Headache    daily - AM (has not been able to have SPG blocks lately)   Hyperlipidemia    Hypertension    Neuropathy    feet   Vertigo    3-4x/yr   Past Surgical History:  Procedure Laterality Date   ABDOMINAL HYSTERECTOMY     But still has cervix   AMPUTATION TOE Right 07/24/2021   Procedure: AMPUTATION TOE;  Surgeon: Neill Boas, DPM;  Location: [facility] ORS;  Service: Podiatry;  Laterality: Right;   CESAREAN SECTION     CHOLECYSTECTOMY     COLONOSCOPY WITH PROPOFOL  N/A 02/20/2022   Procedure: COLONOSCOPY WITH PROPOFOL ;  Surgeon: Therisa Bi, MD;  Location: [facility] ENDOSCOPY;  Service: Gastroenterology;  Laterality: N/A;   left arm frature  10/12/2020   OVARIAN CYST SURGERY     PARATHYROIDECTOMY  03/28/2021   Procedure: PARATHYROIDECTOMY AUTOTRANSPLANT;  Surgeon: Marolyn Nest, MD;  Location: [facility] ORS;  Service: General;;   right femur surgery Right    SHOULDER SURGERY Left 12/26/014   Dr. CECILE Glenn, [facility]   THYROIDECTOMY N/A 03/28/2021   Procedure: THYROIDECTOMY, total;  Surgeon: Marolyn Nest, MD;  Location: [facility] ORS;   Service: General;  Laterality: N/A;  Provider requesting 3 hours /180 minutes for procedure   TONSILLECTOMY AND ADENOIDECTOMY     UTERINE FIBROID SURGERY     Patient Active Problem List   Diagnosis Date Noted   Wheelchair dependence 03/10/2023   Hypertension associated with diabetes (HCC) 03/10/2023   AKI (acute kidney injury) (HCC) 01/07/2023   Neurogenic bladder 01/07/2023   FTT (failure to thrive) in adult 12/09/2022   Primary insomnia 09/10/2022   Impaired functional mobility, balance, gait, and endurance 06/09/2022   Type 2 diabetes mellitus with diabetic neuropathy, with long-term current use of insulin  (HCC) 04/24/2022   Hypothyroidism 04/24/2022   Hyperlipidemia associated with type 2 diabetes mellitus (HCC) 04/24/2022   Iron deficiency anemia 04/24/2022   Chronic sensorimotor polyneuropathy with axonal and demyelinating features 01/12/2022   Osteoarthritis of knees (Bilateral) 01/12/2022   Tricompartment osteoarthritis of knees (Bilateral) 01/12/2022    Grade 1 Retrolisthesis (9mm) of L5 over S1 01/12/2022   History of proximal humerus fracture (Left) 01/12/2022   Chronic peripheral neuropathic pain 01/12/2022   Disorder of skeletal system 11/12/2021   Problems influencing health status 11/12/2021   Chronic hand pain (1ry area of Pain) (Bilateral) (L>R) 11/12/2021   Chronic feet pain (3ry area of Pain) (Bilateral) (L>R) 11/12/2021  Chronic shoulder pain (4th area of Pain) (Left) 11/12/2021   Chronic knee pain (5th area of Pain) (Bilateral) (L>R) 11/12/2021   Lumbar facet syndrome (Bilateral) 11/12/2021   COVID-19 virus infection 07/22/2021   Chronic pain syndrome 07/17/2021   S/P total thyroidectomy 03/28/2021   Thyroid  nodule    Schamberg's purpura 10/09/2019   Common migraine with intractable migraine 06/18/2015   DDD (degenerative disc disease), lumbosacral 05/03/2015   Insomnia due to medical condition 05/03/2015   Disorder of peripheral nervous system 05/03/2015    Diabetic peripheral neuropathy (HCC) 03/06/2015   Obesity 03/06/2015   GERD (gastroesophageal reflux disease) 03/06/2015   Multinodular goiter 03/06/2015   RLS (restless legs syndrome) 03/06/2015   Chronic low back pain (2ry area of Pain) (Bilateral) (L>R) w/o sciatica 03/06/2015    ONSET DATE: 06/18/22  REFERRING DIAG: S72.91XD (ICD-10-CM) - Unspecified fracture of right femur, subsequent encounter for closed fracture with routine healing   THERAPY DIAG:  Muscle weakness (generalized)  Unsteadiness on feet  Difficulty in walking, not elsewhere classified  Other low back pain  Rationale for Evaluation and Treatment: Rehabilitation  SUBJECTIVE:                                                                                                                                                                                             SUBJECTIVE STATEMENT: Pt is a pleasant 58 y/o female known to PT clinic, returning for impairments due to R femur fx sustained in 2023. Pt accompanied by:  Spouse   PERTINENT HISTORY: Pt is a pleasant 58 y/o female known to PT clinic, returning for impairments due to R femur fx sustained in 2023. Pt hx includes fall in June 2023 where pt suffered R femur fx followed by surgery where pt had plate and screws placed in R knee and rod in R femur. At the time pt with hx of multiple falls, now pt reports no falls in last 6 months. Since previous d/c from PT on 05/13/2023, pt has been consistently going to [facility] clinic 1x/week, and now returns to [facility] for new year. Pt reports she ambulated with RW while at [facility] clinic, has been able to perform STS to RW from higher surfaces. She would like to continue working on her mobility and strength, including both UE and LE strength. She still struggles with putting on her shoes and socks and getting from her bed to the commode. She reports standing at [facility] also limited due to chronic low back pain. She reports she'll likely get  injections to tx LBP in future. Pt still performing previous HEP given, typically she completes 1 set  of each exercise for 25-30 reps. Says this is easy. PMH per chart significant for arthritis, blood clotting disorder, DDD (lumbar), DM, headache, HTN, neuropathy, vertigo   PAIN:  Are you having pain? Yes: NPRS scale: not numerically rated Pain location: low back, bilat hand pain hurts a lot all the time  Pain description:   Aggravating factors: standing limited due to LBP in position Relieving factors: wearing gloves helps with hand pain  PRECAUTIONS: Fall  RED FLAGS: None   WEIGHT BEARING RESTRICTIONS: No  FALLS: Has patient fallen in last 6 months? No  LIVING ENVIRONMENT: via chart, pt confirms the following is still correct Lives with: lives with their family and lives with their spouse Lives in: House/apartment Stairs: No Has following equipment at home: Environmental Consultant - 2 wheeled, Wheelchair (manual), Shower bench, bed side commode, and Grab bars  PLOF: Needs assistance with ADLs  PATIENT GOALS: Get out from bed to walker to bedside commode with or without LE braces donned, improve strength in UE/LE  OBJECTIVE:  Note: Objective measures were completed at Evaluation unless otherwise noted.  DIAGNOSTIC FINDINGS:   Via chart [facility] care 05/13/2023 XR Femur 2 views right: Impression  Unchanged position and alignment with further osseous bridging at the distal femur fracture following intramedullary rod fixation. And findings FINDINGS:  Position and alignment of the comminuted distal femur fracture are unchanged after placement of a retrograde interlocking intramedullary rod. Proximal and distal interlocking components articulate appropriately with the rod. There is progressive osseous bridging with remodeling at the fracture site, there is no new fracture or dislocation.   COGNITION: Overall cognitive status: Within functional limits for tasks  assessed   SENSATION: Reports impaired BLE, does have sensation on tops of thighs per report   COORDINATION: WFL UE rapid alt movement WFL LE with heel to shin, but unable to perform rapid alt movement due to weakness in bilat DFs  EDEMA:  Reports no swelling    POSTURE: rounded shoulders and forward head  LOWER EXTREMITY ROM:     Slight impairment with R ankle DF > L with PROM Slight impairment of L knee extension and somewhat painful    LOWER EXTREMITY MMT:    Grossly 4/5 in BLE exception is pt unable to complete DF bilat, other areas of significant mm deficit include hip flexors, abductors and hamstrings bilat  TRANSFERS:  Attempted STS to RW from WC during eval. Unable to perform from wheelchair even with max assist. Pt reports she has been able to complete STS from higher surfaces and this is how she practiced in [facility]  Assistive device utilized: Walker - 2 wheeled  Sit to stand:  unable from standard chair height/WC see above  FUNCTIONAL TESTS:  Attempted STS from [facility], unable at this time. Will attempt functional test like 30 sec STS in future as pt able  PATIENT SURVEYS:  LEFS 50/80 FOTO 54  TREATMENT DATE: 12/29/2023  TA: Reviewed a few reps of each of the following during eval and discussed how to progress at home if still too easy: Access Code: [email or web address] URL: [email or web address] Date: 12/29/2023 Prepared by: Darryle Patten  Exercises - Seated Hamstring Curls with Resistance  - 1 x daily - 5-6 x weekly - 3-4 sets - 20-25 reps - Seated Hip Abduction with Resistance  - 1 x daily - 5-6 x weekly - 3-4 sets - 20-25 reps - Seated March  - 1 x daily - 5-6 x weekly - 3-4 sets - 20-25 reps    PATIENT EDUCATION: Education details: assessment findings, goals, plan, HEP Person educated: Patient and Spouse Education method:  Explanation, Verbal cues, and Handouts Education comprehension: verbalized understanding and returned demonstration  HOME EXERCISE PROGRAM: Access Code: [email or web address] URL: [email or web address] Date: 12/29/2023 Prepared by: Darryle Patten  Exercises - Seated Hamstring Curls with Resistance  - 1 x daily - 5-6 x weekly - 3-4 sets - 20-25 reps - Seated Hip Abduction with Resistance  - 1 x daily - 5-6 x weekly - 3-4 sets - 20-25 reps - Seated March  - 1 x daily - 5-6 x weekly - 3-4 sets - 20-25 reps  GOALS: Goals reviewed with patient? Yes  SHORT TERM GOALS: Target date: 02/09/2024   Patient will be independent in home exercise program to improve strength/mobility for better functional independence with ADLs. Baseline: initiated Goal status: INITIAL    LONG TERM GOALS: Target date: 03/22/2024   Patient will perform sit to stand with min-mod assist from standard chair height in order to indicate improved lower extremity functional strength and improved ability to perform functional transfers and functional leg activities within her home.  Baseline: unable from standard chair height Goal status: INITIAL  2.  Pt will improve LEFS by 10 points or more in order to indicate improved LE function and mobility.  Baseline: 50 Goal status: INITIAL  3.  Patient will increase FOTO score to equal to or greater than  59   to demonstrate statistically significant improvement in mobility and quality of life.  Baseline: 54 Goal status: INITIAL  4.  Pt will require no more than min assist for transfer from bed to bedside commode Baseline:  Goal status: INITIAL  5.  The pt will demo ability to don socks and shoes indep or mod I for increased ease with dressing and improved independence Baseline:  Goal status: INITIAL  6.  The pt will demo ability to complete 30 second STS test to indicate increased ease with transfers and BLE strength Baseline: unable  Goal status:  INITIAL   ASSESSMENT:  CLINICAL IMPRESSION: Patient is a pleasant 58 y.o. female who was seen today for physical therapy evaluation and treatment for impairments related to R femur fx sustained in 2023. Exam reveals deficits in pain, ROM, sensation, strength, mobility, and balance with perception of disability due to impaired LE function AEB LEFs score. The pt will benefit from further skilled PT to improve these impairments in order to increase ease with ADLs, QOL and to reduce fall risk.   OBJECTIVE IMPAIRMENTS: Abnormal gait, decreased activity tolerance, decreased balance, decreased coordination, decreased endurance, decreased mobility, difficulty walking, decreased ROM, decreased strength, hypomobility, impaired sensation, improper body mechanics, postural dysfunction, and pain.   ACTIVITY LIMITATIONS: carrying, lifting, bending, standing, squatting, stairs, transfers, bed mobility, bathing, toileting, dressing, and locomotion level  PARTICIPATION LIMITATIONS: meal prep, cleaning, laundry, driving, shopping, community activity, and yard work  PERSONAL FACTORS: Fitness,  Sex, Time since onset of injury/illness/exacerbation, and 3+ comorbidities: PMH per chart significant for arthritis, blood clotting disorder, DDD (lumbar), DM, headache, HTN, neuropathy, vertigo  are also affecting patient's functional outcome.   REHAB POTENTIAL: Good  CLINICAL DECISION MAKING: Evolving/moderate complexity  EVALUATION COMPLEXITY: Moderate  PLAN:  PT FREQUENCY: 1-2x/week  PT DURATION: 12 weeks  PLANNED INTERVENTIONS: 97164- PT Re-evaluation, 97110-Therapeutic exercises, 97530- Therapeutic activity, 97112- Neuromuscular re-education, 97535- Self Care, [id number]- Manual therapy, [phone]- Gait training, [phone]- Orthotic Fit/training, [phone]- Canalith repositioning, [id number]- Electrical stimulation (manual), [id number]- Ultrasound, Patient/Family education, Balance training, Stair training, Taping, Dry Needling, Joint  mobilization, Spinal mobilization, Vestibular training, DME instructions, Wheelchair mobility training, Cryotherapy, and Moist heat  PLAN FOR NEXT SESSION: update HEP as needed, complete further assessment, strengthening   Darryle JONELLE Patten, PT 12/30/2023, 9:56 AM

## 2024-01-03 ENCOUNTER — Ambulatory Visit: Payer: BC Managed Care – PPO

## 2024-01-04 ENCOUNTER — Ambulatory Visit: Payer: BC Managed Care – PPO | Admitting: Physical Therapy

## 2024-01-05 ENCOUNTER — Ambulatory Visit: Payer: BC Managed Care – PPO

## 2024-01-06 ENCOUNTER — Ambulatory Visit: Payer: BC Managed Care – PPO | Admitting: Physical Therapy

## 2024-01-06 ENCOUNTER — Ambulatory Visit
Payer: BC Managed Care – PPO | Attending: Student in an Organized Health Care Education/Training Program | Admitting: Student in an Organized Health Care Education/Training Program

## 2024-01-06 ENCOUNTER — Encounter: Payer: Self-pay | Admitting: Student in an Organized Health Care Education/Training Program

## 2024-01-06 DIAGNOSIS — G894 Chronic pain syndrome: Secondary | ICD-10-CM | POA: Diagnosis not present

## 2024-01-06 DIAGNOSIS — M25562 Pain in left knee: Secondary | ICD-10-CM | POA: Diagnosis not present

## 2024-01-06 DIAGNOSIS — M79671 Pain in right foot: Secondary | ICD-10-CM | POA: Diagnosis not present

## 2024-01-06 DIAGNOSIS — M5459 Other low back pain: Secondary | ICD-10-CM | POA: Diagnosis not present

## 2024-01-06 DIAGNOSIS — M6281 Muscle weakness (generalized): Secondary | ICD-10-CM

## 2024-01-06 DIAGNOSIS — Z79899 Other long term (current) drug therapy: Secondary | ICD-10-CM | POA: Insufficient documentation

## 2024-01-06 DIAGNOSIS — M79672 Pain in left foot: Secondary | ICD-10-CM | POA: Diagnosis not present

## 2024-01-06 DIAGNOSIS — Z79891 Long term (current) use of opiate analgesic: Secondary | ICD-10-CM | POA: Diagnosis not present

## 2024-01-06 DIAGNOSIS — M4316 Spondylolisthesis, lumbar region: Secondary | ICD-10-CM | POA: Diagnosis not present

## 2024-01-06 DIAGNOSIS — M79642 Pain in left hand: Secondary | ICD-10-CM | POA: Insufficient documentation

## 2024-01-06 DIAGNOSIS — M25561 Pain in right knee: Secondary | ICD-10-CM | POA: Diagnosis not present

## 2024-01-06 DIAGNOSIS — M25512 Pain in left shoulder: Secondary | ICD-10-CM | POA: Insufficient documentation

## 2024-01-06 DIAGNOSIS — M170 Bilateral primary osteoarthritis of knee: Secondary | ICD-10-CM | POA: Diagnosis not present

## 2024-01-06 DIAGNOSIS — R2689 Other abnormalities of gait and mobility: Secondary | ICD-10-CM | POA: Diagnosis not present

## 2024-01-06 DIAGNOSIS — M545 Low back pain, unspecified: Secondary | ICD-10-CM | POA: Insufficient documentation

## 2024-01-06 DIAGNOSIS — M79641 Pain in right hand: Secondary | ICD-10-CM | POA: Insufficient documentation

## 2024-01-06 DIAGNOSIS — M4310 Spondylolisthesis, site unspecified: Secondary | ICD-10-CM | POA: Diagnosis not present

## 2024-01-06 DIAGNOSIS — R2681 Unsteadiness on feet: Secondary | ICD-10-CM | POA: Diagnosis not present

## 2024-01-06 DIAGNOSIS — G8929 Other chronic pain: Secondary | ICD-10-CM | POA: Diagnosis not present

## 2024-01-06 DIAGNOSIS — R262 Difficulty in walking, not elsewhere classified: Secondary | ICD-10-CM

## 2024-01-06 MED ORDER — OXYCODONE-ACETAMINOPHEN 5-325 MG PO TABS: 1.0000 | ORAL_TABLET | Freq: Three times a day (TID) | ORAL | 0 refills | Status: DC

## 2024-01-06 MED ORDER — GABAPENTIN 800 MG PO TABS: 800.0000 mg | ORAL_TABLET | Freq: Three times a day (TID) | ORAL | 2 refills | Status: DC

## 2024-01-06 NOTE — Progress Notes (Signed)
PROVIDER NOTE: Information contained herein reflects review and annotations entered in association with encounter. Interpretation of such information and data should be left to medically-trained personnel. Information provided to patient can be located elsewhere in the medical record under "Patient Instructions". Document created using STT-dictation technology, any transcriptional errors that may result from process are unintentional.    Patient: Gabriella Marsh  Service Category: E/M  Provider: Edward Jolly, MD  DOB: Jan 12, 1966  DOS: 01/06/2024  Referring Provider: Jacky Kindle, FNP  MRN: [id number]  Specialty: Interventional Pain Management  PCP: Sherlyn Hay, DO  Type: Established Patient  Setting: Ambulatory outpatient    Location: Office  Delivery: Face-to-face     HPI  Gabriella Marsh, a 58 y.o. year old female, is here today because of her No primary diagnosis found.. Gabriella Marsh primary complain today is Other (Peripheral neuropathy hands and feet )  Pertinent problems: Gabriella Marsh has Diabetic peripheral neuropathy (HCC); RLS (restless legs syndrome); Chronic low back pain (2ry area of Pain) (Bilateral) (L>R) w/o sciatica; DDD (degenerative disc disease), lumbosacral; Disorder of peripheral nervous system; Chronic pain syndrome; Disorder of skeletal system; Problems influencing health status; Chronic hand pain (1ry area of Pain) (Bilateral) (L>R); Chronic feet pain (3ry area of Pain) (Bilateral) (L>R); Chronic shoulder pain (4th area of Pain) (Left); Chronic knee pain (5th area of Pain) (Bilateral) (L>R); Lumbar facet syndrome (Bilateral); Chronic sensorimotor polyneuropathy with axonal and demyelinating features; Osteoarthritis of knees (Bilateral); Tricompartment osteoarthritis of knees (Bilateral);  Grade 1 Retrolisthesis (9mm) of L5 over S1; and Chronic peripheral neuropathic pain on their pertinent problem list. Pain Assessment: Severity of Chronic pain is reported as a  5 /10. Location:  (hands and feet.  hands are worse than feet.) Left, Right/denies. Onset: More than a month ago. Quality: Numbness, Tingling, Discomfort, Other (Comment), Pins and needles (sensitive.). Timing: Constant. Modifying factor(s): quetenza helps feet, wanting to schedule another visit for that.. Vitals:  height is 5\' 10"  (1.778 m) and weight is 225 lb (102.1 kg). Her temporal temperature is 97.2 F (36.2 C) (abnormal). Her blood pressure is 130/84 and her pulse is 85. Her respiration is 16 and oxygen saturation is 96%.  BMI: Estimated body mass index is 32.28 kg/m as calculated from the following:   Height as of this encounter: 5\' 10"  (1.778 m).   Weight as of this encounter: 225 lb (102.1 kg). Last encounter: 07/06/2023. Last procedure: 08/02/2023.  Reason for encounter:   Has been working with PT which she states has been helpful Patient presents today for medication management.   She is in the process of transitioning to Medicare.  She would like to repeat Qutenza when that is done.  Pharmacotherapy Assessment  Analgesic: Percocet 5 mg TID as needed, quantity 20month   Monitoring: [place] PMP: PDMP reviewed during this encounter.       Pharmacotherapy: No side-effects or adverse reactions reported. Compliance: No problems identified. Effectiveness: Clinically acceptable.  Vernie Ammons, RN  01/06/2024  3:16 PM  Sign when Signing Visit Nursing Pain Medication Assessment:  Safety precautions to be maintained throughout the outpatient stay will include: orient to surroundings, keep bed in low position, maintain call bell within reach at all times, provide assistance with transfer out of bed and ambulation.  Medication Inspection Compliance: Pill count conducted under aseptic conditions, in front of the patient. Neither the pills nor the bottle was removed from the patient's sight at any time. Once count was completed pills were immediately returned to  the patient in their  original bottle.  Medication: Hydrocodone/APAP Pill/Patch Count:  39 of 90 pills remain Pill/Patch Appearance: Markings consistent with prescribed medication Bottle Appearance: Standard pharmacy container. Clearly labeled. Filled Date: 57 / 26 / 2024 Last Medication intake:  Today  Vernie Ammons, RN  01/06/2024  3:09 PM  Sign when Signing Visit Patient had an episode last evening where she felt as if everything slowed down and she was having a little trouble breathing.  Episode lasted approx 1 hour.  Did not seem to be brought on any medications or sugar issues.  Drank a cup of coffee and felt better.     No results found for: "CBDTHCR" No results found for: "D8THCCBX" No results found for: "D9THCCBX"  UDS:  Summary  Date Value Ref Range Status  07/06/2023 Note  Final    Comment:    ==================================================================== [employer] Select 13 (MW) ==================================================================== Test                             Result       Flag       Units  Drug Present and Declared for Prescription Verification   Oxycodone                      614          EXPECTED   ng/mg creat   Oxymorphone                    223          EXPECTED   ng/mg creat   Noroxycodone                   1181         EXPECTED   ng/mg creat   Noroxymorphone                 273          EXPECTED   ng/mg creat    Sources of oxycodone are scheduled prescription medications.    Oxymorphone, noroxycodone, and noroxymorphone are expected    metabolites of oxycodone. Oxymorphone is also available as a    scheduled prescription medication.  ==================================================================== Test                      Result    Flag   Units      Ref Range   Creatinine              244              mg/dL      >=24 ==================================================================== Declared Medications:  The flagging and interpretation on  this report are based on the  following declared medications.  Unexpected results may arise from  inaccuracies in the declared medications.   **Note: The testing scope of this panel includes these medications:   Oxycodone (Percocet)   **Note: The testing scope of this panel does not include the  following reported medications:   Acetaminophen (Tylenol)  Acetaminophen (Percocet)  Atorvastatin (Lipitor)  Clopidogrel (Plavix)  Cyclobenzaprine (Flexeril)  Fluticasone (Flonase)  Gabapentin  Insulin (Lantus)  Levothyroxine (Synthroid)  Lisinopril (Zestril)  Melatonin  Metformin  Nortriptyline (Pamelor)  Omeprazole (Prilosec)  Semaglutide  Trazodone (Desyrel) ==================================================================== For clinical consultation, please call [phone]. ====================================================================       ROS  Constitutional: Denies any fever or chills Gastrointestinal:  No reported hemesis, hematochezia, vomiting, or acute GI distress Musculoskeletal:  Right thoracic and subscapular pain Neurological: No reported episodes of acute onset apraxia, aphasia, dysarthria, agnosia, amnesia, paralysis, loss of coordination, or loss of consciousness  Medication Review  Insulin Pen Needle, Melatonin, Semaglutide(0.25 or 0.5MG /DOS), acetaminophen, atorvastatin, cyclobenzaprine, fluticasone, gabapentin, insulin aspart, insulin glargine, levothyroxine, lisinopril, metFORMIN, nortriptyline, omeprazole, oxyCODONE-acetaminophen, and traZODone  History Review  Allergy: Gabriella Marsh is allergic to celecoxib, pregabalin, buspar [buspirone], citalopram, and other. Drug: Gabriella Marsh  reports no history of drug use. Alcohol:  reports no history of alcohol use. Tobacco:  reports that she quit smoking about 12 years ago. Her smoking use included cigarettes. She has never used smokeless tobacco. Social: Gabriella Marsh  reports that she quit smoking about  12 years ago. Her smoking use included cigarettes. She has never used smokeless tobacco. She reports that she does not drink alcohol and does not use drugs. Medical:  has a past medical history of Arthritis, Blood clotting disorder (HCC), Degenerative disc disease, lumbar, Diabetes mellitus without complication (HCC), GERD (gastroesophageal reflux disease), Headache, Hyperlipidemia, Hypertension, Neuropathy, and Vertigo. Surgical: Gabriella Marsh  has a past surgical history that includes Cesarean section; Shoulder surgery (Left, 12/26/014); Abdominal hysterectomy; Tonsillectomy and adenoidectomy; Uterine fibroid surgery; Ovarian cyst surgery; Cholecystectomy; left arm frature (10/12/2020); Thyroidectomy (N/A, 03/28/2021); Parathyroidectomy (03/28/2021); Amputation toe (Right, 07/24/2021); Colonoscopy with propofol (N/A, 02/20/2022); and right femur surgery (Right). Family: family history includes Congestive Heart Failure in her father; Diabetes in her father; Healthy in her brother; Heart disease in her father; Hyperlipidemia in her father; Hypertension in her father; Irritable bowel syndrome in her mother; Osteoporosis in her maternal grandmother and mother.  Laboratory Chemistry Profile   Renal Lab Results  Component Value Date   BUN 16 07/06/2023   CREATININE 0.50 (L) 07/06/2023   BCR 32 (H) 07/06/2023   GFR 111.18 06/05/2015   GFRAA 92 01/03/2021   GFRNONAA >60 01/09/2023    Hepatic Lab Results  Component Value Date   AST 17 07/06/2023   ALT 23 07/06/2023   ALBUMIN 4.1 07/06/2023   ALKPHOS 72 07/06/2023   LIPASE 47 05/04/2015    Electrolytes Lab Results  Component Value Date   NA 135 07/06/2023   K 4.4 07/06/2023   CL 96 07/06/2023   CALCIUM 8.8 07/06/2023   MG 1.8 01/09/2023    Bone Lab Results  Component Value Date   25OHVITD1 14 (L) 11/12/2021   25OHVITD2 <1.0 11/12/2021   25OHVITD3 13 11/12/2021    Inflammation (CRP: Acute Phase) (ESR: Chronic Phase) Lab Results   Component Value Date   CRP 4 11/12/2021   ESRSEDRATE 39 11/12/2021   LATICACIDVEN 2.9 (HH) 01/07/2023         Note: Above Lab results reviewed.  Recent Imaging Review  US Venous Img Lower Bilateral CLINICAL DATA:  pain  EXAM: BILATERAL LOWER EXTREMITY VENOUS DOPPLER ULTRASOUND  TECHNIQUE: Gray-scale sonography with graded compression, as well as color Doppler and duplex ultrasound were performed to evaluate the lower extremity deep venous systems from the level of the common femoral vein and including the common femoral, femoral, profunda femoral, popliteal and calf veins including the posterior tibial, peroneal and gastrocnemius veins when visible. The superficial great saphenous vein was also interrogated. Spectral Doppler was utilized to evaluate flow at rest and with distal augmentation maneuvers in the common femoral, femoral and popliteal veins.  COMPARISON:  LEFT lower extremity venous duplex, 05/27/2022.  FINDINGS: RIGHT LOWER EXTREMITY  VENOUS  Normal compressibility  of the RIGHT common femoral, superficial femoral, and popliteal veins, as well as the visualized calf veins. Visualized portions of profunda femoral vein and great saphenous vein unremarkable. No filling defects to suggest DVT on grayscale or color Doppler imaging. Doppler waveforms show normal direction of venous flow, normal respiratory plasticity and response to augmentation.  OTHER  No evidence of superficial thrombophlebitis or abnormal fluid collection.  Limitations: Patient body habitus  LEFT LOWER EXTREMITY  VENOUS  Normal compressibility of the LEFT common femoral, superficial femoral, and popliteal veins, as well as the visualized calf veins. Visualized portions of profunda femoral vein and great saphenous vein unremarkable. No filling defects to suggest DVT on grayscale or color Doppler imaging. Doppler waveforms show normal direction of venous flow, normal respiratory  plasticity and response to augmentation.  OTHER  No evidence of superficial thrombophlebitis or abnormal fluid collection.  Limitations: Patient body habitus  IMPRESSION: No evidence of femoropopliteal DVT or superficial thrombophlebitis within either lower extremity.  Roanna Banning, MD  [facility]  [facility] [address] [facility]  Electronically Signed   By: Roanna Banning M.D.   On: 01/07/2023 12:23 DG Chest Port 1 View CLINICAL DATA:  Hypotension.  EXAM: PORTABLE CHEST 1 VIEW  COMPARISON:  07/22/2021  FINDINGS: The lungs are clear without focal pneumonia, edema, pneumothorax or pleural effusion. The cardiopericardial silhouette is within normal limits for size. The visualized bony structures of the thorax are unremarkable. Telemetry leads overlie the chest.  IMPRESSION: No active disease.  Electronically Signed   By: Kennith Center M.D.   On: 01/07/2023 11:21  CLINICAL DATA:  Mid back pain. Left lower extremity weakness.   EXAM: MRI THORACIC SPINE WITHOUT CONTRAST   TECHNIQUE: Multiplanar, multisequence MR imaging of the thoracic spine was performed. No intravenous contrast was administered.   COMPARISON:  Thoracic spine radiographs 04/02/2020   FINDINGS: Alignment: No significant listhesis is present. Mild leftward curvature is again noted in the upper thoracic spine.   Vertebrae: 2 cm hemangioma is present at T8. 11 mm hemangioma is noted inferiorly at T10. Hemangiomas are present at T12. Marrow signal and vertebral body heights are otherwise normal.   Cord:  Normal signal and morphology.   Paraspinal and other soft tissues: A small right pleural effusion is present. Minimal atelectasis is present. Mediastinum is unremarkable. Paraspinous musculature demonstrates mild atrophy.   Disc levels:   Shallow central disc protrusions are present at C5-6 and T6-7 without significant stenosis. Facet hypertrophy contributes  to moderate right and mild left foraminal narrowing at T9-10. Foramina are otherwise patent.   IMPRESSION: 1. Moderate right and mild left foraminal stenosis at T9-10 secondary to facet hypertrophy. 2. Shallow central disc protrusions at C5-6 and T6-7 without significant stenosis. 3. 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 4. Small right pleural effusion.  Note: Reviewed        Physical Exam  General appearance: Well nourished, well developed, and well hydrated. In no apparent acute distress Mental status: Alert, oriented x 3 (person, place, & time)       Respiratory: No evidence of acute respiratory distress Eyes: PERLA Vitals: BP 130/84 (BP Location: Right Arm, Patient Position: Sitting, Cuff Size: Normal)   Pulse 85   Temp (!) 97.2 F (36.2 C) (Temporal)   Resp 16   Ht 5\' 10"  (1.778 m)   Wt 225 lb (102.1 kg)   SpO2 96%   BMI 32.28 kg/m  BMI: Estimated body mass index is 32.28 kg/m as calculated from the following:  Height as of this encounter: 5\' 10"  (1.778 m).   Weight as of this encounter: 225 lb (102.1 kg). Ideal: Ideal body weight: 68.5 kg (151 lb 0.2 oz) Adjusted ideal body weight: 81.9 kg (180 lb 9.7 oz)  Thoracic Spine Area Exam  Skin & Axial Inspection: No masses, redness, or swelling Alignment: Symmetrical Functional ROM: Pain restricted ROM Stability: No instability detected Muscle Tone/Strength: Functionally intact. No obvious neuro-muscular anomalies detected. Sensory (Neurological): Musculoskeletal pain pattern and neurogenic Muscle strength & Tone: No palpable anomalies  Assessment   Diagnosis Status  1. Chronic pain syndrome   2. Osteoarthritis of knees (Bilateral)   3. Pharmacologic therapy   4. Chronic shoulder pain (4th area of Pain) (Left)   5.  Grade 1 Retrolisthesis (9mm) of L5 over S1   6. Chronic hand pain (1ry area of Pain) (Bilateral) (L>R)   7. Encounter for medication management   8. Encounter for chronic pain management   9.  Chronic use of opiate for therapeutic purpose   10. Chronic knee pain (5th area of Pain) (Bilateral) (L>R)   11. Tricompartment osteoarthritis of knees (Bilateral)   12. Chronic low back pain (2ry area of Pain) (Bilateral) (L>R) w/o sciatica   13. Chronic feet pain (3ry area of Pain) (Bilateral) (L>R)    Controlled Controlled Controlled     Plan of Care    Gabriella Marsh has a current medication list which includes the following long-term medication(s): atorvastatin, fluticasone, gabapentin, insulin aspart, lantus solostar, insulin pen needle, levothyroxine, lisinopril, metformin, nortriptyline, omeprazole, trazodone, [START ON 01/15/2024] oxycodone-acetaminophen, [START ON 02/14/2024] oxycodone-acetaminophen, and [START ON 03/15/2024] oxycodone-acetaminophen.   We also discussed repeating Qutenza in the future once she has transitioned to Medicare.  Refill of oxycodone & gabapentin as below, no change in dose, UDS up-to-date and appropriate.   Pharmacotherapy (Medications Ordered): Meds ordered this encounter  Medications   oxyCODONE-acetaminophen (PERCOCET) 5-325 MG tablet    Sig: Take 1 tablet by mouth every 8 (eight) hours. Must last 30 days.    Dispense:  90 tablet    Refill:  0    DO NOT: delete (not duplicate); no partial-fill (will deny script to complete), no refill request (F/U required). DISPENSE: 1 day early if closed on fill date. WARN: No CNS-depressants within 8 hrs of med.   oxyCODONE-acetaminophen (PERCOCET) 5-325 MG tablet    Sig: Take 1 tablet by mouth every 8 (eight) hours. Must last 30 days.    Dispense:  90 tablet    Refill:  0    DO NOT: delete (not duplicate); no partial-fill (will deny script to complete), no refill request (F/U required). DISPENSE: 1 day early if closed on fill date. WARN: No CNS-depressants within 8 hrs of med.   oxyCODONE-acetaminophen (PERCOCET) 5-325 MG tablet    Sig: Take 1 tablet by mouth every 8 (eight) hours. Must last 30  days.    Dispense:  90 tablet    Refill:  0    DO NOT: delete (not duplicate); no partial-fill (will deny script to complete), no refill request (F/U required). DISPENSE: 1 day early if closed on fill date. WARN: No CNS-depressants within 8 hrs of med.   gabapentin (NEURONTIN) 800 MG tablet    Sig: Take 1 tablet (800 mg total) by mouth 3 (three) times daily.    Dispense:  90 tablet    Refill:  2   Orders:  No orders of the defined types were placed in this encounter.  Follow-up plan:  Return in about 3 months (around 04/05/2024) for MM, F2F.     Recent Visits No visits were found meeting these conditions. Showing recent visits within past 90 days and meeting all other requirements Today's Visits Date Type Provider Dept  01/06/24 Office Visit Edward Jolly, MD [facility]  Showing today's visits and meeting all other requirements Future Appointments No visits were found meeting these conditions. Showing future appointments within next 90 days and meeting all other requirements  I discussed the assessment and treatment plan with the patient. The patient was provided an opportunity to ask questions and all were answered. The patient agreed with the plan and demonstrated an understanding of the instructions.  Patient advised to call back or seek an in-person evaluation if the symptoms or condition worsens.  Duration of encounter: .  Total time on encounter, as per AMA guidelines included both the face-to-face and non-face-to-face time personally spent by the physician and/or other qualified health care professional(s) on the day of the encounter (includes time in activities that require the physician or other qualified health care professional and does not include time in activities normally performed by clinical staff). Physician's time may include the following activities when performed: Preparing to see the patient (e.g., pre-charting review of records, searching  for previously ordered imaging, lab work, and nerve conduction tests) Review of prior analgesic pharmacotherapies. Reviewing PMP Interpreting ordered tests (e.g., lab work, imaging, nerve conduction tests) Performing post-procedure evaluations, including interpretation of diagnostic procedures Obtaining and/or reviewing separately obtained history Performing a medically appropriate examination and/or evaluation Counseling and educating the patient/family/caregiver Ordering medications, tests, or procedures Referring and communicating with other health care professionals (when not separately reported) Documenting clinical information in the electronic or other health record Independently interpreting results (not separately reported) and communicating results to the patient/ family/caregiver Care coordination (not separately reported)  Note by: Edward Jolly, MD Date: 01/06/2024; Time: 3:28 PM

## 2024-01-06 NOTE — Therapy (Signed)
OUTPATIENT PHYSICAL THERAPY NEURO EVALUATION   Patient Name: Gabriella Marsh MRN: [id number] DOB:July 15, 1966, 58 y.o., female Today's Date: 01/06/2024   PCP: Sherlyn Hay, DO REFERRING PROVIDER: Cannon Kettle, MD  END OF SESSION:  PT End of Session - 01/06/24 1617     Visit Number 2    Number of Visits 25    Date for PT Re-Evaluation 03/22/24    PT Start Time 1619    PT Stop Time 1700    PT Time Calculation (min) 41 min    Equipment Utilized During Treatment Gait belt    Activity Tolerance Patient tolerated treatment well    Behavior During Therapy WFL for tasks assessed/performed             Past Medical History:  Diagnosis Date   Arthritis    knees   Blood clotting disorder (HCC)    on toe    Degenerative disc disease, lumbar    Diabetes mellitus without complication (HCC)    GERD (gastroesophageal reflux disease)    Headache    daily - AM (has not been able to have SPG blocks lately)   Hyperlipidemia    Hypertension    Neuropathy    feet   Vertigo    3-4x/yr   Past Surgical History:  Procedure Laterality Date   ABDOMINAL HYSTERECTOMY     But still has cervix   AMPUTATION TOE Right 07/24/2021   Procedure: AMPUTATION TOE;  Surgeon: Linus Galas, DPM;  Location: [facility] ORS;  Service: Podiatry;  Laterality: Right;   CESAREAN SECTION     CHOLECYSTECTOMY     COLONOSCOPY WITH PROPOFOL N/A 02/20/2022   Procedure: COLONOSCOPY WITH PROPOFOL;  Surgeon: Wyline Mood, MD;  Location: [facility] ENDOSCOPY;  Service: Gastroenterology;  Laterality: N/A;   left arm frature  10/12/2020   OVARIAN CYST SURGERY     PARATHYROIDECTOMY  03/28/2021   Procedure: PARATHYROIDECTOMY AUTOTRANSPLANT;  Surgeon: Duanne Guess, MD;  Location: [facility] ORS;  Service: General;;   right femur surgery Right    SHOULDER SURGERY Left 12/26/014   Dr. Elfredia Nevins, [facility]   THYROIDECTOMY N/A 03/28/2021   Procedure: THYROIDECTOMY, total;  Surgeon: Duanne Guess, MD;  Location: [facility] ORS;   Service: General;  Laterality: N/A;  Provider requesting 3 hours /180 minutes for procedure   TONSILLECTOMY AND ADENOIDECTOMY     UTERINE FIBROID SURGERY     Patient Active Problem List   Diagnosis Date Noted   Wheelchair dependence 03/10/2023   Hypertension associated with diabetes (HCC) 03/10/2023   AKI (acute kidney injury) (HCC) 01/07/2023   Neurogenic bladder 01/07/2023   FTT (failure to thrive) in adult 12/09/2022   Primary insomnia 09/10/2022   Impaired functional mobility, balance, gait, and endurance 06/09/2022   Type 2 diabetes mellitus with diabetic neuropathy, with long-term current use of insulin (HCC) 04/24/2022   Hypothyroidism 04/24/2022   Hyperlipidemia associated with type 2 diabetes mellitus (HCC) 04/24/2022   Iron deficiency anemia 04/24/2022   Chronic sensorimotor polyneuropathy with axonal and demyelinating features 01/12/2022   Osteoarthritis of knees (Bilateral) 01/12/2022   Tricompartment osteoarthritis of knees (Bilateral) 01/12/2022    Grade 1 Retrolisthesis (9mm) of L5 over S1 01/12/2022   History of proximal humerus fracture (Left) 01/12/2022   Chronic peripheral neuropathic pain 01/12/2022   Disorder of skeletal system 11/12/2021   Problems influencing health status 11/12/2021   Chronic hand pain (1ry area of Pain) (Bilateral) (L>R) 11/12/2021   Chronic feet pain (3ry area of Pain) (Bilateral) (L>R) 11/12/2021  Chronic shoulder pain (4th area of Pain) (Left) 11/12/2021   Chronic knee pain (5th area of Pain) (Bilateral) (L>R) 11/12/2021   Lumbar facet syndrome (Bilateral) 11/12/2021   COVID-19 virus infection 07/22/2021   Chronic pain syndrome 07/17/2021   S/P total thyroidectomy 03/28/2021   Thyroid nodule    Schamberg's purpura 10/09/2019   Common migraine with intractable migraine 06/18/2015   DDD (degenerative disc disease), lumbosacral 05/03/2015   Insomnia due to medical condition 05/03/2015   Disorder of peripheral nervous system 05/03/2015    Diabetic peripheral neuropathy (HCC) 03/06/2015   Obesity 03/06/2015   GERD (gastroesophageal reflux disease) 03/06/2015   Multinodular goiter 03/06/2015   RLS (restless legs syndrome) 03/06/2015   Chronic low back pain (2ry area of Pain) (Bilateral) (L>R) w/o sciatica 03/06/2015    ONSET DATE: 06/18/22  REFERRING DIAG: S72.91XD (ICD-10-CM) - Unspecified fracture of right femur, subsequent encounter for closed fracture with routine healing   THERAPY DIAG:  Muscle weakness (generalized)  Unsteadiness on feet  Difficulty in walking, not elsewhere classified  Other low back pain  Other abnormalities of gait and mobility  Rationale for Evaluation and Treatment: Rehabilitation  SUBJECTIVE:                                                                                                                                                                                             SUBJECTIVE STATEMENT: Pt reports that she is doing well. No pain. Feels like she is getting stronger every day.  Pt accompanied by:  Spouse   PERTINENT HISTORY: Pt is a pleasant 58 y/o female known to PT clinic, returning for impairments due to R femur fx sustained in 2023. Pt hx includes fall in June 2023 where pt suffered R femur fx followed by surgery where pt had plate and screws placed in R knee and rod in R femur. At the time pt with hx of multiple falls, now pt reports no falls in last 6 months. Since previous d/c from PT on 05/13/2023, pt has been consistently going to [facility] clinic 1x/week, and now returns to [facility] for new year. Pt reports she ambulated with RW while at [facility] clinic, has been able to perform STS to RW from higher surfaces. She would like to continue working on her mobility and strength, including both UE and LE strength. She still struggles with putting on her shoes and socks and getting from her bed to the commode. She reports standing at [facility] also limited due to chronic low back pain. She reports  she'll likely get injections to tx LBP in future. Pt still performing previous HEP given, typically she  completes 1 set of each exercise for 25-30 reps. Says this is easy. PMH per chart significant for arthritis, blood clotting disorder, DDD (lumbar), DM, headache, HTN, neuropathy, vertigo   PAIN:  Are you having pain? Yes: NPRS scale: not numerically rated Pain location: low back, bilat hand pain "hurts a lot all the time"  Pain description:   Aggravating factors: standing limited due to LBP in position Relieving factors: wearing gloves helps with hand pain  PRECAUTIONS: Fall  RED FLAGS: None   WEIGHT BEARING RESTRICTIONS: No  FALLS: Has patient fallen in last 6 months? No  LIVING ENVIRONMENT: via chart, pt confirms the following is still correct Lives with: lives with their family and lives with their spouse Lives in: House/apartment Stairs: No Has following equipment at home: Environmental consultant - 2 wheeled, Wheelchair (manual), Shower bench, bed side commode, and Grab bars  PLOF: Needs assistance with ADLs  PATIENT GOALS: Get out from bed to walker to bedside commode with or without LE braces donned, improve strength in UE/LE  OBJECTIVE:  Note: Objective measures were completed at Evaluation unless otherwise noted.  DIAGNOSTIC FINDINGS:   Via chart [facility] 05/13/2023 XR Femur 2 views right: "Impression  Unchanged position and alignment with further osseous bridging at the distal femur fracture following intramedullary rod fixation." And findings "FINDINGS:  Position and alignment of the comminuted distal femur fracture are unchanged after placement of a retrograde interlocking intramedullary rod. Proximal and distal interlocking components articulate appropriately with the rod. There is progressive osseous bridging with remodeling at the fracture site, there is no new fracture or dislocation. "  COGNITION: Overall cognitive status: Within functional limits for tasks  assessed   SENSATION: Reports impaired BLE, does have sensation on tops of thighs per report   COORDINATION: WFL UE rapid alt movement WFL LE with heel to shin, but unable to perform rapid alt movement due to weakness in bilat DFs  EDEMA:  Reports no swelling    POSTURE: rounded shoulders and forward head  LOWER EXTREMITY ROM:     Slight impairment with R ankle DF > L with PROM Slight impairment of L knee extension and somewhat painful    LOWER EXTREMITY MMT:    Grossly 4/5 in BLE exception is pt unable to complete DF bilat, other areas of significant mm deficit include hip flexors, abductors and hamstrings bilat  TRANSFERS:  Attempted STS to RW from WC during eval. Unable to perform from wheelchair even with max assist. Pt reports she has been able to complete STS from higher surfaces and this is how she practiced in [facility]  Assistive device utilized: Walker - 2 wheeled  Sit to stand:  unable from standard chair height/WC see above  FUNCTIONAL TESTS:  Attempted STS from [facility], unable at this time. Will attempt functional test like 30 sec STS in future as pt able  PATIENT SURVEYS:  LEFS 50/80 FOTO 54  TREATMENT DATE: 12/29/2023  Slide board transfer to mat table. Sitting balance EOB while husband assisted don bil AFO and KAFO.   Sit<>stand from EOM with mod assist and +2 for safety to stabilize RW.  Gait with RW 44ft x2 with CGA-min assist and +2 for WC follow. Noted to have increased R ankle tone causing inversion with fatigue after second bout.   Seated LAQ x 10 bil  Reciprocal hip flexion x 10 bil  Hip abduction/adduction x 10 bil   30 sec stand test with +2 assist for safety: was able to complete 2 stands as listed in goals.   PATIENT EDUCATION: Education details: assessment findings, goals, plan, HEP. WC planning Person educated:  Patient and Spouse Education method: Explanation, Verbal cues, and Handouts Education comprehension: verbalized understanding and returned demonstration  HOME EXERCISE PROGRAM: Access Code: [email or web address] URL: [email or web address] Date: 12/29/2023 Prepared by: Temple Pacini  Exercises - Seated Hamstring Curls with Resistance  - 1 x daily - 5-6 x weekly - 3-4 sets - 20-25 reps - Seated Hip Abduction with Resistance  - 1 x daily - 5-6 x weekly - 3-4 sets - 20-25 reps - Seated March  - 1 x daily - 5-6 x weekly - 3-4 sets - 20-25 reps  GOALS: Goals reviewed with patient? Yes  SHORT TERM GOALS: Target date: 02/09/2024   Patient will be independent in home exercise program to improve strength/mobility for better functional independence with ADLs. Baseline: initiated Goal status: INITIAL    LONG TERM GOALS: Target date: 03/22/2024   Patient will perform sit to stand with min-mod assist from standard chair height in order to indicate improved lower extremity functional strength and improved ability to perform functional transfers and functional leg activities within her home.  Baseline: mod assist with +2 for safety.  Goal status: INITIAL  2.  Pt will improve LEFS by 10 points or more in order to indicate improved LE function and mobility.  Baseline: 50 Goal status: INITIAL  3.  Patient will increase FOTO score to equal to or greater than  59   to demonstrate statistically significant improvement in mobility and quality of life.  Baseline: 54 Goal status: INITIAL  4.  Pt will require no more than min assist for transfer from bed to bedside commode Baseline: supervision assist with  slide board transfer. To mat table  Goal status: INITIAL  5.  The pt will demo ability to don socks and shoes indep or mod I for increased ease with dressing and improved independence Baseline: required max assist from husband  Goal status: INITIAL  6.  The pt will demo ability to complete 30  second STS test to indicate increased ease with transfers and BLE strength Baseline: 2 sit<>stand from [facility] with cushion in place and  +2 for safety.  Goal status: INITIAL   ASSESSMENT:  CLINICAL IMPRESSION: Patient is a pleasant 58 y.o. female who was seen today for physical therapy treatment for impairments related to R femur fx sustained in 2023. Able to ambulate on this day with PT 2 x 41ft with KAFO, AFO, RW and 2 WC follow for safety. CGA-min assist from PT once in standing. 30 sec sit<>stand 2 reps with Mod A +2. Tolerated therex well with AFO/KAFO in place. The pt will benefit from further skilled PT to improve these impairments in order to increase ease with ADLs, QOL and to reduce fall risk.   OBJECTIVE IMPAIRMENTS: Abnormal gait, decreased activity tolerance, decreased balance, decreased coordination, decreased endurance, decreased  mobility, difficulty walking, decreased ROM, decreased strength, hypomobility, impaired sensation, improper body mechanics, postural dysfunction, and pain.   ACTIVITY LIMITATIONS: carrying, lifting, bending, standing, squatting, stairs, transfers, bed mobility, bathing, toileting, dressing, and locomotion level  PARTICIPATION LIMITATIONS: meal prep, cleaning, laundry, driving, shopping, community activity, and yard work  PERSONAL FACTORS: Fitness, Sex, Time since onset of injury/illness/exacerbation, and 3+ comorbidities: PMH per chart significant for arthritis, blood clotting disorder, DDD (lumbar), DM, headache, HTN, neuropathy, vertigo  are also affecting patient's functional outcome.   REHAB POTENTIAL: Good  CLINICAL DECISION MAKING: Evolving/moderate complexity  EVALUATION COMPLEXITY: Moderate  PLAN:  PT FREQUENCY: 1-2x/week  PT DURATION: 12 weeks  PLANNED INTERVENTIONS: 97164- PT Re-evaluation, 97110-Therapeutic exercises, 97530- Therapeutic activity, [id number]- Neuromuscular re-education, 97535- Self Care, [id number]- Manual therapy, [phone]- Gait  training, [phone]- Orthotic Fit/training, [phone]- Canalith repositioning, [id number]- Electrical stimulation (manual), [id number]- Ultrasound, Patient/Family education, Balance training, Stair training, Taping, Dry Needling, Joint mobilization, Spinal mobilization, Vestibular training, DME instructions, Wheelchair mobility training, Cryotherapy, and Moist heat  PLAN FOR NEXT SESSION:   update HEP as needed,  Activity tolerance training.   Golden Pop, PT 01/06/2024, 4:18 PM

## 2024-01-06 NOTE — Progress Notes (Signed)
Patient had an episode last evening where she felt as if everything slowed down and she was having a little trouble breathing.  Episode lasted approx 1 hour.  Did not seem to be brought on any medications or sugar issues.  Drank a cup of coffee and felt better.

## 2024-01-06 NOTE — Progress Notes (Signed)
Nursing Pain Medication Assessment:  Safety precautions to be maintained throughout the outpatient stay will include: orient to surroundings, keep bed in low position, maintain call bell within reach at all times, provide assistance with transfer out of bed and ambulation.  Medication Inspection Compliance: Pill count conducted under aseptic conditions, in front of the patient. Neither the pills nor the bottle was removed from the patient's sight at any time. Once count was completed pills were immediately returned to the patient in their original bottle.  Medication: Hydrocodone/APAP Pill/Patch Count:  39 of 90 pills remain Pill/Patch Appearance: Markings consistent with prescribed medication Bottle Appearance: Standard pharmacy container. Clearly labeled. Filled Date: 88 / 26 / 2024 Last Medication intake:  Today

## 2024-01-11 ENCOUNTER — Ambulatory Visit: Payer: BC Managed Care – PPO

## 2024-01-11 ENCOUNTER — Ambulatory Visit: Payer: BC Managed Care – PPO | Admitting: Family Medicine

## 2024-01-11 VITALS — BP 124/75 | HR 81 | Ht 70.0 in | Wt 255.0 lb

## 2024-01-11 DIAGNOSIS — E1140 Type 2 diabetes mellitus with diabetic neuropathy, unspecified: Secondary | ICD-10-CM | POA: Diagnosis not present

## 2024-01-11 DIAGNOSIS — E042 Nontoxic multinodular goiter: Secondary | ICD-10-CM

## 2024-01-11 DIAGNOSIS — K219 Gastro-esophageal reflux disease without esophagitis: Secondary | ICD-10-CM

## 2024-01-11 DIAGNOSIS — Z993 Dependence on wheelchair: Secondary | ICD-10-CM

## 2024-01-11 DIAGNOSIS — E1142 Type 2 diabetes mellitus with diabetic polyneuropathy: Secondary | ICD-10-CM

## 2024-01-11 DIAGNOSIS — Z23 Encounter for immunization: Secondary | ICD-10-CM

## 2024-01-11 DIAGNOSIS — E559 Vitamin D deficiency, unspecified: Secondary | ICD-10-CM

## 2024-01-11 DIAGNOSIS — Z09 Encounter for follow-up examination after completed treatment for conditions other than malignant neoplasm: Secondary | ICD-10-CM

## 2024-01-11 DIAGNOSIS — Z794 Long term (current) use of insulin: Secondary | ICD-10-CM

## 2024-01-11 NOTE — Patient Instructions (Addendum)
Please call the [facility] [phone]) to schedule a routine screening mammogram.   Recommended vaccines:  - covid booster  - shingrix (shingles)  - prevnar-20 or prevnar-21 (pneumonia)  Increase Ozempic to 0.5 mg weekly

## 2024-01-11 NOTE — Progress Notes (Signed)
Established patient visit   Patient: Gabriella Marsh   DOB: [birth year]   58 y.o. Female  MRN: [id number] Visit Date: 01/11/2024  Today's healthcare provider: Sherlyn Hay, DO   Chief Complaint  Patient presents with   Follow-up   Subjective    HPI Now on disability; waiting for medicare to start   The patient, a long-term wheelchair user with a history of diabetes, neuropathy, and a previous femur fracture, presented with concerns about her insulin management. She reported difficulty in applying the prescribed Dexcom G7 out of concern for limited areas available for injection due to her daily use of a lift chair and gait belt for mobility. The patient also expressed confusion about the correct dosage of her Ozempic medication, which was clarified during the consultation.  The patient's neuropathy has resulted in a complete loss of sensation in both feet, and she reported a recent episode of breathlessness, which caused significant concern. This was the second such episode in a month, and the patient's spouse expressed worry about these incidents. The patient also reported a history of a fall resulting in a femur fracture, which was attributed to the neuropathy in her left leg. She previously received Qutenza for her feet but has not been able to have more and is awaiting the start of her Medicare to do so. She also reports a plan to start back injections.  The patient's medications include Novolog, Lantus, Ozempic, thyroxine, omeprazole, Flexeril, trazodone, and nortriptyline. She also mentioned a reduction in her melatonin dosage to 5mg , which she reported as being effective.  The patient also reported a history of sensitivity in her hands due to neuropathy, which requires her to wear gloves 24/7. She also mentioned a previous hospital admission a year ago due to a significant drop in blood pressure, which she attributed to being on three different medications at the  time.  The patient also reported a need for a podiatrist visit for toenail care and expressed a desire to increase her Ozempic dosage.     Medications: Outpatient Medications Prior to Visit  Medication Sig   acetaminophen (TYLENOL) 500 MG tablet Take 1,000 mg by mouth every 12 (twelve) hours as needed for moderate pain.   atorvastatin (LIPITOR) 20 MG tablet TAKE 1 TABLET(20 MG) BY MOUTH DAILY   cyclobenzaprine (FLEXERIL) 5 MG tablet Take 1 tablet (5 mg total) by mouth 3 (three) times daily as needed for muscle spasms.   fluticasone (FLONASE) 50 MCG/ACT nasal spray Place 2 sprays into both nostrils daily.   gabapentin (NEURONTIN) 800 MG tablet Take 1 tablet (800 mg total) by mouth 3 (three) times daily.   insulin glargine (LANTUS SOLOSTAR) 100 UNIT/ML Solostar Pen Inject 24 Units into the skin at bedtime.   Insulin Pen Needle 30G X 5 MM MISC 1 each by Does not apply route in the morning, at noon, in the evening, and at bedtime.   levothyroxine (SYNTHROID) 112 MCG tablet Take 2 tablets (224 mcg total) by mouth daily.   lisinopril (ZESTRIL) 2.5 MG tablet Take 1 tablet (2.5 mg total) by mouth daily.   Melatonin 10 MG TABS Take 10 mg by mouth at bedtime.   metFORMIN (GLUCOPHAGE) 500 MG tablet Take 2 tablets (1,000 mg total) by mouth 2 (two) times daily with a meal.   nortriptyline (PAMELOR) 50 MG capsule Take 1 capsule (50 mg total) by mouth at bedtime.   oxyCODONE-acetaminophen (PERCOCET) 5-325 MG tablet Take 1 tablet by mouth every 8 (  eight) hours. Must last 30 days.   [START ON 02/14/2024] oxyCODONE-acetaminophen (PERCOCET) 5-325 MG tablet Take 1 tablet by mouth every 8 (eight) hours. Must last 30 days.   [START ON 03/15/2024] oxyCODONE-acetaminophen (PERCOCET) 5-325 MG tablet Take 1 tablet by mouth every 8 (eight) hours. Must last 30 days.   Semaglutide,0.25 or 0.5MG /DOS, 2 MG/3ML SOPN Inject 0.5 mg into the skin once a week.   traZODone (DESYREL) 100 MG tablet Take 1 tablet (100 mg total) by  mouth at bedtime.   [DISCONTINUED] omeprazole (PRILOSEC) 20 MG capsule Take 1 capsule (20 mg total) by mouth daily. TAKE 1 CAPSULE DAILY   insulin aspart (NOVOLOG) 100 UNIT/ML FlexPen Inject 4 Units into the skin 3 (three) times daily with meals. Hold if not eating. (Patient not taking: Reported on 01/11/2024)   No facility-administered medications prior to visit.        Objective    BP 124/75 (BP Location: Right Arm, Patient Position: Sitting, Cuff Size: Normal)   Pulse 81   Ht 5\' 10"  (1.778 m)   Wt 255 lb (115.7 kg)   SpO2 97%   BMI 36.59 kg/m     Physical Exam Constitutional:      Appearance: Normal appearance.  HENT:     Head: Normocephalic and atraumatic.  Eyes:     General: No scleral icterus.    Conjunctiva/sclera: Conjunctivae normal.  Cardiovascular:     Pulses:          Dorsalis pedis pulses are 1+ on the right side and 1+ on the left side.       Posterior tibial pulses are 1+ on the right side and 1+ on the left side.  Musculoskeletal:     Right foot: Decreased range of motion. Foot drop present. No deformity, bunion, Charcot foot or prominent metatarsal heads.     Left foot: Decreased range of motion. Foot drop present. No deformity, bunion, Charcot foot or prominent metatarsal heads.  Feet:     Right foot:     Protective Sensation: 10 sites tested.  0 sites sensed.     Skin integrity: No ulcer, blister, skin breakdown, erythema, warmth, callus, dry skin or fissure.     Toenail Condition: Right toenails are abnormally thick and long.     Left foot:     Protective Sensation: 10 sites tested.  0 sites sensed.     Skin integrity: No ulcer, blister, skin breakdown, erythema, warmth, callus, dry skin or fissure.     Toenail Condition: Left toenails are abnormally thick and long.  Neurological:     Mental Status: She is alert and oriented to person, place, and time. Mental status is at baseline.  Psychiatric:        Mood and Affect: Mood normal.        Behavior:  Behavior normal.      No results found for any visits on 01/11/24.  Assessment & Plan    Follow-up exam, less than 3 months since previous exam  Type 2 diabetes mellitus with diabetic neuropathy, with long-term current use of insulin (HCC) Assessment & Plan: Diabetes mellitus managed with Novolog, Ozempic, and nighttime insulin. Issues with Ozempic administration due to misunderstanding regarding dosage (Incorrect Ozempic dosage at 0.25 mg instead of 0.5 mg led to running out of administration needles too early). Discussed increasing Ozempic to 0.5 mg weekly, monitoring for side effects (nausea, vomiting, diarrhea, constipation), and as well as potential for future increases. - Educate on proper use of Ozempic and  injection sites - Increase Ozempic to 0.5 mg weekly - Educated on how to apply Dexcom CGM and applied first CGM for patient today. - Order blood work - Follow up in 3.5 to 4 weeks to assess Ozempic adjustment and tolerance - Monitor for side effects of Ozempic (nausea, vomiting, diarrhea, constipation)  Orders: -     Microalbumin / creatinine urine ratio -     Comprehensive metabolic panel -     Hemoglobin A1c -     Lipid panel  Multinodular goiter Assessment & Plan: Will check thyroid levels today.  Orders: -     TSH Rfx on Abnormal to Free T4  Wheelchair dependence Assessment & Plan: Chronic ISO hx falls, debility and neuropathy.   Continue physical therapy. Continue to monitor.   Gastroesophageal reflux disease without esophagitis Assessment & Plan: No acute concerns.  Continue to monitor. Refill today.  Orders: -     Omeprazole; Take 1 capsule (20 mg total) by mouth daily. TAKE 1 CAPSULE DAILY  Dispense: 90 capsule; Refill: 3  Diabetic peripheral neuropathy (HCC) Assessment & Plan: Peripheral neuropathy with significant loss of sensation in feet and hands. Wears gloves and boots for symptom management. Qutenza treatments on hold due to insurance issues.  Discussed resuming treatments once Medicare is active. - Continue wearing gloves and boots for symptom management - Resume Qutenza treatments with pain management once Medicare is active   Vitamin D deficiency -     VITAMIN D 25 Hydroxy (Vit-D Deficiency, Fractures)  Encounter for Prevnar pneumococcal vaccination -     Pneumococcal conjugate vaccine 20-valent  Need for shingles vaccine -     Varicella-zoster vaccine IM  Dyspnea Episodes of dyspnea twice in the past month, resolving spontaneously. Advised to check blood pressure during episodes. No pulse oximeter available at home. Discussed the importance of monitoring oxygen saturation during episodes. - Advise to check blood pressure during episodes - Recommend purchasing a pulse oximeter for home use  General Health Maintenance Overdue for mammogram. Requires shingles and pneumonia vaccinations. Received flu vaccine. Discussed the importance of these preventative measures. - Order mammogram - Administer Shingrix vaccine - Administer pneumonia vaccine (Prevnar 20 or Prevnar 21)   Return in about 3 months (around 04/19/2024) for DM.      I discussed the assessment and treatment plan with the patient  The patient was provided an opportunity to ask questions and all were answered. The patient agreed with the plan and demonstrated an understanding of the instructions.   The patient was advised to call back or seek an in-person evaluation if the symptoms worsen or if the condition fails to improve as anticipated.  Total time was 40 minutes. That includes chart review before the visit, the actual patient visit, and time spent on documentation after the visit.    Sherlyn Hay, DO  [facility] [phone] (phone) [phone] (fax)  [facility]

## 2024-01-13 MED ORDER — OMEPRAZOLE 20 MG PO CPDR: 20.0000 mg | DELAYED_RELEASE_CAPSULE | Freq: Every day | ORAL | 3 refills | Status: DC

## 2024-01-16 ENCOUNTER — Encounter: Payer: Self-pay | Admitting: Family Medicine

## 2024-01-16 NOTE — Assessment & Plan Note (Signed)
Will check thyroid levels today.

## 2024-01-16 NOTE — Assessment & Plan Note (Signed)
No acute concerns.  Continue to monitor. Refill today.

## 2024-01-16 NOTE — Assessment & Plan Note (Signed)
Peripheral neuropathy with significant loss of sensation in feet and hands. Wears gloves and boots for symptom management. Qutenza treatments on hold due to insurance issues. Discussed resuming treatments once Medicare is active. - Continue wearing gloves and boots for symptom management - Resume Qutenza treatments with pain management once Medicare is active

## 2024-01-16 NOTE — Assessment & Plan Note (Addendum)
Diabetes mellitus managed with Novolog, Ozempic, and nighttime insulin. Issues with Ozempic administration due to misunderstanding regarding dosage (Incorrect Ozempic dosage at 0.25 mg instead of 0.5 mg led to running out of administration needles too early). Discussed increasing Ozempic to 0.5 mg weekly, monitoring for side effects (nausea, vomiting, diarrhea, constipation), and as well as potential for future increases. - Educate on proper use of Ozempic and injection sites - Increase Ozempic to 0.5 mg weekly - Educated on how to apply Dexcom CGM and applied first CGM for patient today. - Order blood work - Follow up in 3.5 to 4 weeks to assess Ozempic adjustment and tolerance - Monitor for side effects of Ozempic (nausea, vomiting, diarrhea, constipation)

## 2024-01-16 NOTE — Assessment & Plan Note (Addendum)
Chronic ISO hx falls, debility and neuropathy.   Continue physical therapy. Continue to monitor.

## 2024-01-17 ENCOUNTER — Ambulatory Visit: Payer: BC Managed Care – PPO | Admitting: Physical Therapy

## 2024-01-17 ENCOUNTER — Encounter: Payer: Self-pay | Admitting: Family Medicine

## 2024-01-19 ENCOUNTER — Ambulatory Visit: Payer: BC Managed Care – PPO | Admitting: Physical Therapy

## 2024-01-20 ENCOUNTER — Ambulatory Visit: Payer: BC Managed Care – PPO | Admitting: Physical Therapy

## 2024-01-24 DIAGNOSIS — E113592 Type 2 diabetes mellitus with proliferative diabetic retinopathy without macular edema, left eye: Secondary | ICD-10-CM | POA: Diagnosis not present

## 2024-01-25 ENCOUNTER — Ambulatory Visit: Payer: BC Managed Care – PPO

## 2024-01-25 ENCOUNTER — Telehealth: Payer: Self-pay | Admitting: *Deleted

## 2024-01-25 NOTE — Progress Notes (Signed)
 Complex Care Management Note  Care Guide Note 01/25/2024 Name: Gabriella Marsh MRN: [id number] DOB: 08/03/1966  Gabriella Marsh is a 59 y.o. year old female who sees Pardue, Lauraine SAILOR, DO for primary care. I reached out to Rock Candi Bloomer by phone today to offer complex care management services.  [name] was given information about Complex Care Management services today including:   The Complex Care Management services include support from the care team which includes your Nurse Care Manager, Clinical Social Worker, or Pharmacist.  The Complex Care Management team is here to help remove barriers to the health concerns and goals most important to you. Complex Care Management services are voluntary, and the patient may decline or stop services at any time by request to their care team member.   Complex Care Management Consent Status: Patient did not agree to participate in complex care management services at this time.  Follow up plan:  pt declined need for services at this time.   Encounter Outcome:  Patient Refused  Thedford Franks, CMA, Care Guide [facility]  [facility], [facility] Guide Direct Dial : [phone]  Fax: [phone] Website: [email or web address]

## 2024-01-26 ENCOUNTER — Ambulatory Visit: Payer: BC Managed Care – PPO | Attending: Orthopedic Surgery | Admitting: Physical Therapy

## 2024-01-26 DIAGNOSIS — R262 Difficulty in walking, not elsewhere classified: Secondary | ICD-10-CM

## 2024-01-26 DIAGNOSIS — M6281 Muscle weakness (generalized): Secondary | ICD-10-CM | POA: Diagnosis not present

## 2024-01-26 DIAGNOSIS — R2681 Unsteadiness on feet: Secondary | ICD-10-CM | POA: Diagnosis not present

## 2024-01-26 DIAGNOSIS — R2689 Other abnormalities of gait and mobility: Secondary | ICD-10-CM | POA: Diagnosis not present

## 2024-01-26 DIAGNOSIS — M5459 Other low back pain: Secondary | ICD-10-CM

## 2024-01-26 NOTE — Therapy (Signed)
 OUTPATIENT PHYSICAL THERAPY NEURO EVALUATION   Patient Name: Gabriella Marsh MRN: [id number] DOB:May 08, 1966, 58 y.o., female Today's Date: 01/26/2024   PCP: Donzella Lauraine SAILOR, DO REFERRING PROVIDER: Laurence Prentice DASEN, MD  END OF SESSION:  PT End of Session - 01/26/24 1616     Visit Number 3    Number of Visits 25    Date for PT Re-Evaluation 03/22/24    PT Start Time 1618    PT Stop Time 1658    PT Time Calculation (min) 40 min    Equipment Utilized During Treatment Gait belt    Activity Tolerance Patient tolerated treatment well    Behavior During Therapy WFL for tasks assessed/performed             Past Medical History:  Diagnosis Date   Arthritis    knees   Blood clotting disorder (HCC)    on toe    Degenerative disc disease, lumbar    Diabetes mellitus without complication (HCC)    GERD (gastroesophageal reflux disease)    Headache    daily - AM (has not been able to have SPG blocks lately)   Hyperlipidemia    Hypertension    Neuropathy    feet   Vertigo    3-4x/yr   Past Surgical History:  Procedure Laterality Date   ABDOMINAL HYSTERECTOMY     But still has cervix   AMPUTATION TOE Right 07/24/2021   Procedure: AMPUTATION TOE;  Surgeon: Neill Boas, DPM;  Location: [facility] ORS;  Service: Podiatry;  Laterality: Right;   CESAREAN SECTION     CHOLECYSTECTOMY     COLONOSCOPY WITH PROPOFOL  N/A 02/20/2022   Procedure: COLONOSCOPY WITH PROPOFOL ;  Surgeon: Therisa Bi, MD;  Location: [facility] ENDOSCOPY;  Service: Gastroenterology;  Laterality: N/A;   left arm frature  10/12/2020   OVARIAN CYST SURGERY     PARATHYROIDECTOMY  03/28/2021   Procedure: PARATHYROIDECTOMY AUTOTRANSPLANT;  Surgeon: Marolyn Nest, MD;  Location: [facility] ORS;  Service: General;;   right femur surgery Right    SHOULDER SURGERY Left 12/26/014   Dr. CECILE Glenn, [facility]   THYROIDECTOMY N/A 03/28/2021   Procedure: THYROIDECTOMY, total;  Surgeon: Marolyn Nest, MD;  Location: [facility] ORS;   Service: General;  Laterality: N/A;  Provider requesting 3 hours /180 minutes for procedure   TONSILLECTOMY AND ADENOIDECTOMY     UTERINE FIBROID SURGERY     Patient Active Problem List   Diagnosis Date Noted   Wheelchair dependence 03/10/2023   Hypertension associated with diabetes (HCC) 03/10/2023   AKI (acute kidney injury) (HCC) 01/07/2023   Neurogenic bladder 01/07/2023   FTT (failure to thrive) in adult 12/09/2022   Primary insomnia 09/10/2022   Impaired functional mobility, balance, gait, and endurance 06/09/2022   Type 2 diabetes mellitus with diabetic neuropathy, with long-term current use of insulin  (HCC) 04/24/2022   Hypothyroidism 04/24/2022   Hyperlipidemia associated with type 2 diabetes mellitus (HCC) 04/24/2022   Iron deficiency anemia 04/24/2022   Chronic sensorimotor polyneuropathy with axonal and demyelinating features 01/12/2022   Osteoarthritis of knees (Bilateral) 01/12/2022   Tricompartment osteoarthritis of knees (Bilateral) 01/12/2022    Grade 1 Retrolisthesis (9mm) of L5 over S1 01/12/2022   History of proximal humerus fracture (Left) 01/12/2022   Vitamin D  deficiency 01/12/2022   Chronic peripheral neuropathic pain 01/12/2022   Disorder of skeletal system 11/12/2021   Problems influencing health status 11/12/2021   Chronic hand pain (1ry area of Pain) (Bilateral) (L>R) 11/12/2021   Chronic feet pain (3ry area  of Pain) (Bilateral) (L>R) 11/12/2021   Chronic shoulder pain (4th area of Pain) (Left) 11/12/2021   Chronic knee pain (5th area of Pain) (Bilateral) (L>R) 11/12/2021   Lumbar facet syndrome (Bilateral) 11/12/2021   COVID-19 virus infection 07/22/2021   Chronic pain syndrome 07/17/2021   S/P total thyroidectomy 03/28/2021   Thyroid  nodule    Schamberg's purpura 10/09/2019   Common migraine with intractable migraine 06/18/2015   DDD (degenerative disc disease), lumbosacral 05/03/2015   Insomnia due to medical condition 05/03/2015   Disorder of  peripheral nervous system 05/03/2015   Diabetic peripheral neuropathy (HCC) 03/06/2015   Obesity 03/06/2015   GERD (gastroesophageal reflux disease) 03/06/2015   Multinodular goiter 03/06/2015   RLS (restless legs syndrome) 03/06/2015   Chronic low back pain (2ry area of Pain) (Bilateral) (L>R) w/o sciatica 03/06/2015    ONSET DATE: 06/18/22  REFERRING DIAG: S72.91XD (ICD-10-CM) - Unspecified fracture of right femur, subsequent encounter for closed fracture with routine healing   THERAPY DIAG:  Muscle weakness (generalized)  Unsteadiness on feet  Difficulty in walking, not elsewhere classified  Other low back pain  Other abnormalities of gait and mobility  Rationale for Evaluation and Treatment: Rehabilitation  SUBJECTIVE:                                                                                                                                                                                             SUBJECTIVE STATEMENT: Pt reports that she is okay. Reports fall from bed last Sunday night into Monday morning. Required EMT for assist to recover from floor. States that she has she has a little soreness in ribs at bra-line. Was not seen by MD since fall. Will continue to monitor   Pt accompanied by:  Spouse   PERTINENT HISTORY: Pt is a pleasant 58 y/o female known to PT clinic, returning for impairments due to R femur fx sustained in 2023. Pt hx includes fall in June 2023 where pt suffered R femur fx followed by surgery where pt had plate and screws placed in R knee and rod in R femur. At the time pt with hx of multiple falls, now pt reports no falls in last 6 months. Since previous d/c from PT on 05/13/2023, pt has been consistently going to [facility] clinic 1x/week, and now returns to [facility] for new year. Pt reports she ambulated with RW while at [facility] clinic, has been able to perform STS to RW from higher surfaces. She would like to continue working on her mobility and strength,  including both UE and LE strength. She still struggles with putting on her shoes and socks and  getting from her bed to the commode. She reports standing at [facility] also limited due to chronic low back pain. She reports she'll likely get injections to tx LBP in future. Pt still performing previous HEP given, typically she completes 1 set of each exercise for 25-30 reps. Says this is easy. PMH per chart significant for arthritis, blood clotting disorder, DDD (lumbar), DM, headache, HTN, neuropathy, vertigo   PAIN:  Are you having pain? Yes: NPRS scale: not numerically rated Pain location: low back, bilat hand pain hurts a lot all the time  Pain description:   Aggravating factors: standing limited due to LBP in position Relieving factors: wearing gloves helps with hand pain  PRECAUTIONS: Fall  RED FLAGS: None   WEIGHT BEARING RESTRICTIONS: No  FALLS: Has patient fallen in last 6 months? No  LIVING ENVIRONMENT: via chart, pt confirms the following is still correct Lives with: lives with their family and lives with their spouse Lives in: House/apartment Stairs: No Has following equipment at home: Environmental Consultant - 2 wheeled, Wheelchair (manual), Shower bench, bed side commode, and Grab bars  PLOF: Needs assistance with ADLs  PATIENT GOALS: Get out from bed to walker to bedside commode with or without LE braces donned, improve strength in UE/LE  OBJECTIVE:  Note: Objective measures were completed at Evaluation unless otherwise noted.  DIAGNOSTIC FINDINGS:   Via chart [facility] care 05/13/2023 XR Femur 2 views right: Impression  Unchanged position and alignment with further osseous bridging at the distal femur fracture following intramedullary rod fixation. And findings FINDINGS:  Position and alignment of the comminuted distal femur fracture are unchanged after placement of a retrograde interlocking intramedullary rod. Proximal and distal interlocking components articulate appropriately with  the rod. There is progressive osseous bridging with remodeling at the fracture site, there is no new fracture or dislocation.   COGNITION: Overall cognitive status: Within functional limits for tasks assessed   SENSATION: Reports impaired BLE, does have sensation on tops of thighs per report   COORDINATION: WFL UE rapid alt movement WFL LE with heel to shin, but unable to perform rapid alt movement due to weakness in bilat DFs  EDEMA:  Reports no swelling    POSTURE: rounded shoulders and forward head  LOWER EXTREMITY ROM:     Slight impairment with R ankle DF > L with PROM Slight impairment of L knee extension and somewhat painful    LOWER EXTREMITY MMT:    Grossly 4/5 in BLE exception is pt unable to complete DF bilat, other areas of significant mm deficit include hip flexors, abductors and hamstrings bilat  TRANSFERS:  Attempted STS to RW from WC during eval. Unable to perform from wheelchair even with max assist. Pt reports she has been able to complete STS from higher surfaces and this is how she practiced in [facility]  Assistive device utilized: Walker - 2 wheeled  Sit to stand:  unable from standard chair height/WC see above  FUNCTIONAL TESTS:  Attempted STS from [facility], unable at this time. Will attempt functional test like 30 sec STS in future as pt able  PATIENT SURVEYS:  LEFS 50/80 FOTO 54  TREATMENT DATE: 12/29/2023  Slide board transfer to mat table. Set up assist from PT only.   Sit<>supine with supervision assist from PT with cues for BLE position.  Supine therex:  SAQ AROM x 12, and 23 AW 2 x 12  Heel slide 2x 12 Bridge x 10, mild pain from  rep8-10   Clam shell RTB  x 12  Reciprocal march x 12  SLR x 12  Ankle DF x 15   Sitting balance EOB while husband assisted don bil AFO and KAFO.   Sit<>stand from EOM with min assist   from elevated mat and +2 for safety to stabilize RW.  Gait with RW 81ft with CGA-min assist and +2 for WC follow. Noted to have increased R ankle tone causing inversion with fatigue  Stand pivot transfer to [facility] with CGA and RW and AFO/KAFO.   PATIENT EDUCATION: Education details: assessment findings, goals, plan, HEP. WC planning HEP expansion  Person educated: Patient and Spouse Education method: Explanation, Verbal cues, and Handouts Education comprehension: verbalized understanding and returned demonstration  HOME EXERCISE PROGRAM: Access Code: [email or web address] URL: [email or web address] Date: 12/29/2023 Prepared by: Darryle Patten  Exercises - Seated Hamstring Curls with Resistance  - 1 x daily - 5-6 x weekly - 3-4 sets - 20-25 reps - Seated Hip Abduction with Resistance  - 1 x daily - 5-6 x weekly - 3-4 sets - 20-25 reps - Seated March  - 1 x daily - 5-6 x weekly - 3-4 sets - 20-25 reps   Access Code: [email or web address] URL: [email or web address] Date: 01/26/2024 Prepared by: Massie Dollar  Exercises - Supine Heel Slide  - 1 x daily - 7 x weekly - 3 sets - 10 reps - Supine Straight Leg Raises  - 1 x daily - 7 x weekly - 3 sets - 10 reps - Hooklying Clamshell with Resistance  - 1 x daily - 7 x weekly - 3 sets - 10 reps - Supine Hip Flexion  - 1 x daily - 7 x weekly - 3 sets - 10 reps - Supine Single Leg Ankle Pumps  - 1 x daily - 7 x weekly - 3 sets - 10 reps - Supine Bridge  - 1 x daily - 7 x weekly - 3 sets - 10 reps GOALS: Goals reviewed with patient? Yes  SHORT TERM GOALS: Target date: 02/09/2024   Patient will be independent in home exercise program to improve strength/mobility for better functional independence with ADLs. Baseline: initiated Goal status: INITIAL    LONG TERM GOALS: Target date: 03/22/2024   Patient will perform sit to stand with min-mod assist from standard chair height in order to indicate improved lower extremity functional strength  and improved ability to perform functional transfers and functional leg activities within her home.  Baseline: mod assist with +2 for safety.  Goal status: INITIAL  2.  Pt will improve LEFS by 10 points or more in order to indicate improved LE function and mobility.  Baseline: 50 Goal status: INITIAL  3.  Patient will increase FOTO score to equal to or greater than  59   to demonstrate statistically significant improvement in mobility and quality of life.  Baseline: 54 Goal status: INITIAL  4.  Pt will require no more than min assist for transfer from bed to bedside commode Baseline: supervision assist with  slide board transfer. To mat table  Goal status: INITIAL  5.  The pt will demo ability to don socks and shoes indep or mod I  for increased ease with dressing and improved independence Baseline: required max assist from husband  Goal status: INITIAL  6.  The pt will demo ability to complete 30 second STS test to indicate increased ease with transfers and BLE strength Baseline: 2 sit<>stand from [facility] with cushion in place and  +2 for safety.  Goal status: INITIAL   ASSESSMENT:  CLINICAL IMPRESSION: Patient is a pleasant 58 y.o. female who was seen today for physical therapy treatment for impairments related to R femur fx sustained in 2023. Pt reports fall from bed while asleep 1.5 weeks ago. Mild pain at braline in ribs. Was not seen by MD. Instructed in HEP expansion at bed level for BLE strengthening. Tolerated gait for short disntance with RW and CGA for safety.  The pt will benefit from further skilled PT to improve these impairments in order to increase ease with ADLs, QOL and to reduce fall risk.   OBJECTIVE IMPAIRMENTS: Abnormal gait, decreased activity tolerance, decreased balance, decreased coordination, decreased endurance, decreased mobility, difficulty walking, decreased ROM, decreased strength, hypomobility, impaired sensation, improper body mechanics, postural dysfunction,  and pain.   ACTIVITY LIMITATIONS: carrying, lifting, bending, standing, squatting, stairs, transfers, bed mobility, bathing, toileting, dressing, and locomotion level  PARTICIPATION LIMITATIONS: meal prep, cleaning, laundry, driving, shopping, community activity, and yard work  PERSONAL FACTORS: Fitness, Sex, Time since onset of injury/illness/exacerbation, and 3+ comorbidities: PMH per chart significant for arthritis, blood clotting disorder, DDD (lumbar), DM, headache, HTN, neuropathy, vertigo  are also affecting patient's functional outcome.   REHAB POTENTIAL: Good  CLINICAL DECISION MAKING: Evolving/moderate complexity  EVALUATION COMPLEXITY: Moderate  PLAN:  PT FREQUENCY: 1-2x/week  PT DURATION: 12 weeks  PLANNED INTERVENTIONS: [id number]- PT Re-evaluation, 97110-Therapeutic exercises, 97530- Therapeutic activity, [id number]- Neuromuscular re-education, 97535- Self Care, [id number]- Manual therapy, [id number]- Gait training, [phone]- Orthotic Fit/training, [phone]- Canalith repositioning, [id number]- Electrical stimulation (manual), [id number]- Ultrasound, Patient/Family education, Balance training, Stair training, Taping, Dry Needling, Joint mobilization, Spinal mobilization, Vestibular training, DME instructions, Wheelchair mobility training, Cryotherapy, and Moist heat  PLAN FOR NEXT SESSION:   BLE strengthening. Gait as tolerated with AFO/KAFO  Massie FORBES Dollar, PT 01/26/2024, 4:17 PM

## 2024-01-27 ENCOUNTER — Ambulatory Visit: Payer: BC Managed Care – PPO

## 2024-01-28 ENCOUNTER — Encounter: Payer: Self-pay | Admitting: Family Medicine

## 2024-01-28 DIAGNOSIS — E1140 Type 2 diabetes mellitus with diabetic neuropathy, unspecified: Secondary | ICD-10-CM

## 2024-02-01 DIAGNOSIS — E1165 Type 2 diabetes mellitus with hyperglycemia: Secondary | ICD-10-CM | POA: Diagnosis not present

## 2024-02-01 MED ORDER — SEMAGLUTIDE(0.25 OR 0.5MG/DOS) 2 MG/3ML ~~LOC~~ SOPN: 0.5000 mg | PEN_INJECTOR | SUBCUTANEOUS | 0 refills | Status: DC

## 2024-02-02 ENCOUNTER — Ambulatory Visit: Payer: BC Managed Care – PPO | Admitting: Physical Therapy

## 2024-02-02 DIAGNOSIS — M5459 Other low back pain: Secondary | ICD-10-CM | POA: Diagnosis not present

## 2024-02-02 DIAGNOSIS — R2681 Unsteadiness on feet: Secondary | ICD-10-CM | POA: Diagnosis not present

## 2024-02-02 DIAGNOSIS — R262 Difficulty in walking, not elsewhere classified: Secondary | ICD-10-CM | POA: Diagnosis not present

## 2024-02-02 DIAGNOSIS — M6281 Muscle weakness (generalized): Secondary | ICD-10-CM

## 2024-02-02 DIAGNOSIS — R2689 Other abnormalities of gait and mobility: Secondary | ICD-10-CM | POA: Diagnosis not present

## 2024-02-02 NOTE — Therapy (Unsigned)
OUTPATIENT PHYSICAL THERAPY NEURO treatment   Patient Name: Gabriella Marsh MRN: [id number] DOB:12-16-1966, 58 y.o., female Today's Date: 02/02/2024   PCP: Sherlyn Hay, DO REFERRING PROVIDER: Cannon Kettle, MD  END OF SESSION:  PT End of Session - 02/02/24 1604     Visit Number 4    Number of Visits 25    Date for PT Re-Evaluation 03/22/24    PT Start Time 1615    PT Stop Time 1655    PT Time Calculation (min) 40 min    Equipment Utilized During Treatment Gait belt    Activity Tolerance Patient tolerated treatment well    Behavior During Therapy WFL for tasks assessed/performed             Past Medical History:  Diagnosis Date   Arthritis    knees   Blood clotting disorder (HCC)    on toe    Degenerative disc disease, lumbar    Diabetes mellitus without complication (HCC)    GERD (gastroesophageal reflux disease)    Headache    daily - AM (has not been able to have SPG blocks lately)   Hyperlipidemia    Hypertension    Neuropathy    feet   Vertigo    3-4x/yr   Past Surgical History:  Procedure Laterality Date   ABDOMINAL HYSTERECTOMY     But still has cervix   AMPUTATION TOE Right 07/24/2021   Procedure: AMPUTATION TOE;  Surgeon: Linus Galas, DPM;  Location: [facility];  Service: Podiatry;  Laterality: Right;   CESAREAN SECTION     CHOLECYSTECTOMY     COLONOSCOPY WITH PROPOFOL N/A 02/20/2022   Procedure: COLONOSCOPY WITH PROPOFOL;  Surgeon: Wyline Mood, MD;  Location: [facility];  Service: Gastroenterology;  Laterality: N/A;   left arm frature  10/12/2020   OVARIAN CYST SURGERY     PARATHYROIDECTOMY  03/28/2021   Procedure: PARATHYROIDECTOMY AUTOTRANSPLANT;  Surgeon: Duanne Guess, MD;  Location: [facility];  Service: General;;   right femur surgery Right    SHOULDER SURGERY Left 12/26/014   Dr. Elfredia Nevins, [facility]   THYROIDECTOMY N/A 03/28/2021   Procedure: THYROIDECTOMY, total;  Surgeon: Duanne Guess, MD;  Location: [facility] OR[facility];   Service: General;  Laterality: N/A;  Provider requesting 3 hours /180 minutes for procedure   TONSILLECTOMY AND ADENOIDECTOMY     UTERINE FIBROID SURGERY     Patient Active Problem List   Diagnosis Date Noted   Wheelchair dependence 03/10/2023   Hypertension associated with diabetes (HCC) 03/10/2023   AKI (acute kidney injury) (HCC) 01/07/2023   Neurogenic bladder 01/07/2023   FTT (failure to thrive) in adult 12/09/2022   Primary insomnia 09/10/2022   Impaired functional mobility, balance, gait, and endurance 06/09/2022   Type 2 diabetes mellitus with diabetic neuropathy, with long-term current use of insulin (HCC) 04/24/2022   Hypothyroidism 04/24/2022   Hyperlipidemia associated with type 2 diabetes mellitus (HCC) 04/24/2022   Iron deficiency anemia 04/24/2022   Chronic sensorimotor polyneuropathy with axonal and demyelinating features 01/12/2022   Osteoarthritis of knees (Bilateral) 01/12/2022   Tricompartment osteoarthritis of knees (Bilateral) 01/12/2022    Grade 1 Retrolisthesis (9mm) of L5 over S1 01/12/2022   History of proximal humerus fracture (Left) 01/12/2022   Vitamin D deficiency 01/12/2022   Chronic peripheral neuropathic pain 01/12/2022   Disorder of skeletal system 11/12/2021   Problems influencing health status 11/12/2021   Chronic hand pain (1ry area of Pain) (Bilateral) (L>R) 11/12/2021   Chronic feet pain (3ry area  of Pain) (Bilateral) (L>R) 11/12/2021   Chronic shoulder pain (4th area of Pain) (Left) 11/12/2021   Chronic knee pain (5th area of Pain) (Bilateral) (L>R) 11/12/2021   Lumbar facet syndrome (Bilateral) 11/12/2021   COVID-19 virus infection 07/22/2021   Chronic pain syndrome 07/17/2021   S/P total thyroidectomy 03/28/2021   Thyroid nodule    Schamberg's purpura 10/09/2019   Common migraine with intractable migraine 06/18/2015   DDD (degenerative disc disease), lumbosacral 05/03/2015   Insomnia due to medical condition 05/03/2015   Disorder of  peripheral nervous system 05/03/2015   Diabetic peripheral neuropathy (HCC) 03/06/2015   Obesity 03/06/2015   GERD (gastroesophageal reflux disease) 03/06/2015   Multinodular goiter 03/06/2015   RLS (restless legs syndrome) 03/06/2015   Chronic low back pain (2ry area of Pain) (Bilateral) (L>R) w/o sciatica 03/06/2015    ONSET DATE: 06/18/22  REFERRING DIAG: S72.91XD (ICD-10-CM) - Unspecified fracture of right femur, subsequent encounter for closed fracture with routine healing   THERAPY DIAG:  Muscle weakness (generalized)  Unsteadiness on feet  Difficulty in walking, not elsewhere classified  Other low back pain  Other abnormalities of gait and mobility  Rationale for Evaluation and Treatment: Rehabilitation  SUBJECTIVE:                                                                                                                                                                                             SUBJECTIVE STATEMENT:   Pt reports that she is doing well.  Reports no pain in torse from fall 3 weeks ago. States that she has been completing exercises from HEP daily.   Pt accompanied by:  Spouse   PERTINENT HISTORY: Pt is a pleasant 58 y/o female known to PT clinic, returning for impairments due to R femur fx sustained in 2023. Pt hx includes fall in June 2023 where pt suffered R femur fx followed by surgery where pt had plate and screws placed in R knee and rod in R femur. At the time pt with hx of multiple falls, now pt reports no falls in last 6 months. Since previous d/c from PT on 05/13/2023, pt has been consistently going to [facility] clinic 1x/week, and now returns to [facility] for new year. Pt reports she ambulated with RW while at [facility] clinic, has been able to perform STS to RW from higher surfaces. She would like to continue working on her mobility and strength, including both UE and LE strength. She still struggles with putting on her shoes and socks and getting from her bed  to the commode. She reports standing at [facility] also limited due to chronic low  back pain. She reports she'll likely get injections to tx LBP in future. Pt still performing previous HEP given, typically she completes 1 set of each exercise for 25-30 reps. Says this is easy. PMH per chart significant for arthritis, blood clotting disorder, DDD (lumbar), DM, headache, HTN, neuropathy, vertigo   PAIN:  Are you having pain? Yes: NPRS scale: not numerically rated Pain location: low back, bilat hand pain "hurts a lot all the time"  Pain description:   Aggravating factors: standing limited due to LBP in position Relieving factors: wearing gloves helps with hand pain  PRECAUTIONS: Fall  RED FLAGS: None   WEIGHT BEARING RESTRICTIONS: No  FALLS: Has patient fallen in last 6 months? No  LIVING ENVIRONMENT: via chart, pt confirms the following is still correct Lives with: lives with their family and lives with their spouse Lives in: House/apartment Stairs: No Has following equipment at home: Environmental consultant - 2 wheeled, Wheelchair (manual), Shower bench, bed side commode, and Grab bars  PLOF: Needs assistance with ADLs  PATIENT GOALS: Get out from bed to walker to bedside commode with or without LE braces donned, improve strength in UE/LE  OBJECTIVE:  Note: Objective measures were completed at Evaluation unless otherwise noted.  DIAGNOSTIC FINDINGS:   Via chart [facility] 05/13/2023 XR Femur 2 views right: "Impression  Unchanged position and alignment with further osseous bridging at the distal femur fracture following intramedullary rod fixation." And findings "FINDINGS:  Position and alignment of the comminuted distal femur fracture are unchanged after placement of a retrograde interlocking intramedullary rod. Proximal and distal interlocking components articulate appropriately with the rod. There is progressive osseous bridging with remodeling at the fracture site, there is no new fracture or  dislocation. "  COGNITION: Overall cognitive status: Within functional limits for tasks assessed   SENSATION: Reports impaired BLE, does have sensation on tops of thighs per report   COORDINATION: WFL UE rapid alt movement WFL LE with heel to shin, but unable to perform rapid alt movement due to weakness in bilat DFs  EDEMA:  Reports no swelling    POSTURE: rounded shoulders and forward head  LOWER EXTREMITY ROM:     Slight impairment with R ankle DF > L with PROM Slight impairment of L knee extension and somewhat painful    LOWER EXTREMITY MMT:    Grossly 4/5 in BLE exception is pt unable to complete DF bilat, other areas of significant mm deficit include hip flexors, abductors and hamstrings bilat  TRANSFERS:  Attempted STS to RW from WC during eval. Unable to perform from wheelchair even with max assist. Pt reports she has been able to complete STS from higher surfaces and this is how she practiced in [facility]  Assistive device utilized: Walker - 2 wheeled  Sit to stand:  unable from standard chair height/WC see above  FUNCTIONAL TESTS:  Attempted STS from [facility], unable at this time. Will attempt functional test like 30 sec STS in future as pt able  PATIENT SURVEYS:  LEFS 50/80 FOTO 54  TREATMENT DATE: 12/29/2023  Attempted Slide board transfer to mat table, but requesting stand pivot, as KAFO limiting position of SB. Min-mod assist provided by husband in transfer and PT assisted with management of KAFO locking mechanism.   Sit<>stand x 6 from EOB of elevated mat table  with cues for decreased support from EOB on back of knees.   Gait with RW, AFO and KAFO x 58ft and 54ft with min assist and WC follow for safety. 2 standing rest breaks on 23ft gait training. Min assist initially to prevent posterior LOB, but then no additional LOB throughout  session. Continues to demonstrate inversion on the RLE in stance.   Nustep level 1 x 5 min BLE only. Cues for hip position to improve neutral hip activation.    PATIENT EDUCATION: Education details: assessment findings, goals, plan, HEP. WC planning HEP expansion  Person educated: Patient and Spouse Education method: Explanation, Verbal cues, and Handouts Education comprehension: verbalized understanding and returned demonstration  HOME EXERCISE PROGRAM: Access Code: [email or web address] URL: [email or web address] Date: 12/29/2023 Prepared by: Temple Pacini  Exercises - Seated Hamstring Curls with Resistance  - 1 x daily - 5-6 x weekly - 3-4 sets - 20-25 reps - Seated Hip Abduction with Resistance  - 1 x daily - 5-6 x weekly - 3-4 sets - 20-25 reps - Seated March  - 1 x daily - 5-6 x weekly - 3-4 sets - 20-25 reps   Access Code: [email or web address] URL: [email or web address] Date: 01/26/2024 Prepared by: Grier Rocher  Exercises - Supine Heel Slide  - 1 x daily - 7 x weekly - 3 sets - 10 reps - Supine Straight Leg Raises  - 1 x daily - 7 x weekly - 3 sets - 10 reps - Hooklying Clamshell with Resistance  - 1 x daily - 7 x weekly - 3 sets - 10 reps - Supine Hip Flexion  - 1 x daily - 7 x weekly - 3 sets - 10 reps - Supine Single Leg Ankle Pumps  - 1 x daily - 7 x weekly - 3 sets - 10 reps - Supine Bridge  - 1 x daily - 7 x weekly - 3 sets - 10 reps GOALS: Goals reviewed with patient? Yes  SHORT TERM GOALS: Target date: 02/09/2024   Patient will be independent in home exercise program to improve strength/mobility for better functional independence with ADLs. Baseline: initiated Goal status: INITIAL    LONG TERM GOALS: Target date: 03/22/2024   Patient will perform sit to stand with min-mod assist from standard chair height in order to indicate improved lower extremity functional strength and improved ability to perform functional transfers and functional leg activities  within her home.  Baseline: mod assist with +2 for safety.  Goal status: INITIAL  2.  Pt will improve LEFS by 10 points or more in order to indicate improved LE function and mobility.  Baseline: 50 Goal status: INITIAL  3.  Patient will increase FOTO score to equal to or greater than  59   to demonstrate statistically significant improvement in mobility and quality of life.  Baseline: 54 Goal status: INITIAL  4.  Pt will require no more than min assist for transfer from bed to bedside commode Baseline: supervision assist with  slide board transfer. To mat table  Goal status: INITIAL  5.  The pt will demo ability to don socks and shoes indep or mod I for increased ease with dressing and improved independence Baseline: required max assist from husband  Goal status: INITIAL  6.  The pt will demo ability to complete 30 second STS test to indicate increased ease with transfers and BLE strength Baseline: 2 sit<>stand from [facility] with cushion in place and  +2 for safety.  Goal status: INITIAL   ASSESSMENT:  CLINICAL IMPRESSION: Patient is a pleasant 58 y.o. female who was seen today for physical therapy treatment for impairments related to R femur fx sustained in 2023. PT treatment focused on functional transfers from elevated surface as well as increased tolerance to standing through gait training with bracing on BLE. Improved activation of BLE with use of nustep on BLE and improved ability to maintain neutral hip position. Mod assist for transfers from [facility] and min assist from elevated mat table. The pt will benefit from further skilled PT to improve these impairments in order to increase ease with ADLs, QOL and to reduce fall risk.   OBJECTIVE IMPAIRMENTS: Abnormal gait, decreased activity tolerance, decreased balance, decreased coordination, decreased endurance, decreased mobility, difficulty walking, decreased ROM, decreased strength, hypomobility, impaired sensation, improper body mechanics,  postural dysfunction, and pain.   ACTIVITY LIMITATIONS: carrying, lifting, bending, standing, squatting, stairs, transfers, bed mobility, bathing, toileting, dressing, and locomotion level  PARTICIPATION LIMITATIONS: meal prep, cleaning, laundry, driving, shopping, community activity, and yard work  PERSONAL FACTORS: Fitness, Sex, Time since onset of injury/illness/exacerbation, and 3+ comorbidities: PMH per chart significant for arthritis, blood clotting disorder, DDD (lumbar), DM, headache, HTN, neuropathy, vertigo  are also affecting patient's functional outcome.   REHAB POTENTIAL: Good  CLINICAL DECISION MAKING: Evolving/moderate complexity  EVALUATION COMPLEXITY: Moderate  PLAN:  PT FREQUENCY: 1-2x/week  PT DURATION: 12 weeks  PLANNED INTERVENTIONS: 97164- PT Re-evaluation, 97110-Therapeutic exercises, 97530- Therapeutic activity, [id number]- Neuromuscular re-education, 97535- Self Care, 16109- Manual therapy, [id number]- Gait training, [phone]- Orthotic Fit/training, [phone]- Canalith repositioning, [id number]- Electrical stimulation (manual), [id number]- Ultrasound, Patient/Family education, Balance training, Stair training, Taping, Dry Needling, Joint mobilization, Spinal mobilization, Vestibular training, DME instructions, Wheelchair mobility training, Cryotherapy, and Moist heat  PLAN FOR NEXT SESSION:   BLE strengthening. Gait as tolerated with AFO/KAFO  Golden Pop, PT 02/02/2024, 4:05 PM

## 2024-02-09 ENCOUNTER — Ambulatory Visit: Payer: BC Managed Care – PPO | Admitting: Physical Therapy

## 2024-02-16 ENCOUNTER — Ambulatory Visit: Payer: BC Managed Care – PPO | Admitting: Physical Therapy

## 2024-02-16 DIAGNOSIS — R2689 Other abnormalities of gait and mobility: Secondary | ICD-10-CM | POA: Diagnosis not present

## 2024-02-16 DIAGNOSIS — R262 Difficulty in walking, not elsewhere classified: Secondary | ICD-10-CM | POA: Diagnosis not present

## 2024-02-16 DIAGNOSIS — R2681 Unsteadiness on feet: Secondary | ICD-10-CM

## 2024-02-16 DIAGNOSIS — M5459 Other low back pain: Secondary | ICD-10-CM | POA: Diagnosis not present

## 2024-02-16 DIAGNOSIS — M6281 Muscle weakness (generalized): Secondary | ICD-10-CM

## 2024-02-16 NOTE — Therapy (Signed)
 OUTPATIENT PHYSICAL THERAPY NEURO treatment   Patient Name: Gabriella Marsh MRN: [id number] DOB:June 06, 1966, 58 y.o., female Today's Date: 02/16/2024   PCP: Sherlyn Hay, DO REFERRING PROVIDER: Cannon Kettle, MD  END OF SESSION:  PT End of Session - 02/16/24 1649     Visit Number 5    Number of Visits 25    Date for PT Re-Evaluation 03/22/24    PT Start Time 1618    PT Stop Time 1658    PT Time Calculation (min) 40 min    Equipment Utilized During Treatment Gait belt    Activity Tolerance Patient tolerated treatment well    Behavior During Therapy WFL for tasks assessed/performed             Past Medical History:  Diagnosis Date   Arthritis    knees   Blood clotting disorder (HCC)    on toe    Degenerative disc disease, lumbar    Diabetes mellitus without complication (HCC)    GERD (gastroesophageal reflux disease)    Headache    daily - AM (has not been able to have SPG blocks lately)   Hyperlipidemia    Hypertension    Neuropathy    feet   Vertigo    3-4x/yr   Past Surgical History:  Procedure Laterality Date   ABDOMINAL HYSTERECTOMY     But still has cervix   AMPUTATION TOE Right 07/24/2021   Procedure: AMPUTATION TOE;  Surgeon: Linus Galas, DPM;  Location: [facility] ORS;  Service: Podiatry;  Laterality: Right;   CESAREAN SECTION     CHOLECYSTECTOMY     COLONOSCOPY WITH PROPOFOL N/A 02/20/2022   Procedure: COLONOSCOPY WITH PROPOFOL;  Surgeon: Wyline Mood, MD;  Location: [facility] ENDOSCOPY;  Service: Gastroenterology;  Laterality: N/A;   left arm frature  10/12/2020   OVARIAN CYST SURGERY     PARATHYROIDECTOMY  03/28/2021   Procedure: PARATHYROIDECTOMY AUTOTRANSPLANT;  Surgeon: Duanne Guess, MD;  Location: [facility] ORS;  Service: General;;   right femur surgery Right    SHOULDER SURGERY Left 12/26/014   Dr. Elfredia Nevins, [facility]   THYROIDECTOMY N/A 03/28/2021   Procedure: THYROIDECTOMY, total;  Surgeon: Duanne Guess, MD;  Location: [facility] ORS;   Service: General;  Laterality: N/A;  Provider requesting 3 hours /180 minutes for procedure   TONSILLECTOMY AND ADENOIDECTOMY     UTERINE FIBROID SURGERY     Patient Active Problem List   Diagnosis Date Noted   Wheelchair dependence 03/10/2023   Hypertension associated with diabetes (HCC) 03/10/2023   AKI (acute kidney injury) (HCC) 01/07/2023   Neurogenic bladder 01/07/2023   FTT (failure to thrive) in adult 12/09/2022   Primary insomnia 09/10/2022   Impaired functional mobility, balance, gait, and endurance 06/09/2022   Type 2 diabetes mellitus with diabetic neuropathy, with long-term current use of insulin (HCC) 04/24/2022   Hypothyroidism 04/24/2022   Hyperlipidemia associated with type 2 diabetes mellitus (HCC) 04/24/2022   Iron deficiency anemia 04/24/2022   Chronic sensorimotor polyneuropathy with axonal and demyelinating features 01/12/2022   Osteoarthritis of knees (Bilateral) 01/12/2022   Tricompartment osteoarthritis of knees (Bilateral) 01/12/2022    Grade 1 Retrolisthesis (9mm) of L5 over S1 01/12/2022   History of proximal humerus fracture (Left) 01/12/2022   Vitamin D deficiency 01/12/2022   Chronic peripheral neuropathic pain 01/12/2022   Disorder of skeletal system 11/12/2021   Problems influencing health status 11/12/2021   Chronic hand pain (1ry area of Pain) (Bilateral) (L>R) 11/12/2021   Chronic feet pain (3ry area  of Pain) (Bilateral) (L>R) 11/12/2021   Chronic shoulder pain (4th area of Pain) (Left) 11/12/2021   Chronic knee pain (5th area of Pain) (Bilateral) (L>R) 11/12/2021   Lumbar facet syndrome (Bilateral) 11/12/2021   COVID-19 virus infection 07/22/2021   Chronic pain syndrome 07/17/2021   S/P total thyroidectomy 03/28/2021   Thyroid nodule    Schamberg's purpura 10/09/2019   Common migraine with intractable migraine 06/18/2015   DDD (degenerative disc disease), lumbosacral 05/03/2015   Insomnia due to medical condition 05/03/2015   Disorder of  peripheral nervous system 05/03/2015   Diabetic peripheral neuropathy (HCC) 03/06/2015   Obesity 03/06/2015   GERD (gastroesophageal reflux disease) 03/06/2015   Multinodular goiter 03/06/2015   RLS (restless legs syndrome) 03/06/2015   Chronic low back pain (2ry area of Pain) (Bilateral) (L>R) w/o sciatica 03/06/2015    ONSET DATE: 06/18/22  REFERRING DIAG: S72.91XD (ICD-10-CM) - Unspecified fracture of right femur, subsequent encounter for closed fracture with routine healing   THERAPY DIAG:  Muscle weakness (generalized)  Unsteadiness on feet  Difficulty in walking, not elsewhere classified  Other low back pain  Other abnormalities of gait and mobility  Rationale for Evaluation and Treatment: Rehabilitation  SUBJECTIVE:                                                                                                                                                                                             SUBJECTIVE STATEMENT:   Pt reports that she is doing well, but feels a little give out, with specific reason. But reports that she has not eaten much today. No other issues.   Pt accompanied by:  Spouse   PERTINENT HISTORY: Pt is a pleasant 58 y/o female known to PT clinic, returning for impairments due to R femur fx sustained in 2023. Pt hx includes fall in June 2023 where pt suffered R femur fx followed by surgery where pt had plate and screws placed in R knee and rod in R femur. At the time pt with hx of multiple falls, now pt reports no falls in last 6 months. Since previous d/c from PT on 05/13/2023, pt has been consistently going to [facility] clinic 1x/week, and now returns to [facility] for new year. Pt reports she ambulated with RW while at [facility] clinic, has been able to perform STS to RW from higher surfaces. She would like to continue working on her mobility and strength, including both UE and LE strength. She still struggles with putting on her shoes and socks and getting from her  bed to the commode. She reports standing at [facility] also limited due to chronic low  back pain. She reports she'll likely get injections to tx LBP in future. Pt still performing previous HEP given, typically she completes 1 set of each exercise for 25-30 reps. Says this is easy. PMH per chart significant for arthritis, blood clotting disorder, DDD (lumbar), DM, headache, HTN, neuropathy, vertigo   PAIN:  Are you having pain? Yes: NPRS scale: not numerically rated Pain location: low back, bilat hand pain "hurts a lot all the time"  Pain description:   Aggravating factors: standing limited due to LBP in position Relieving factors: wearing gloves helps with hand pain  PRECAUTIONS: Fall  RED FLAGS: None   WEIGHT BEARING RESTRICTIONS: No  FALLS: Has patient fallen in last 6 months? No  LIVING ENVIRONMENT: via chart, pt confirms the following is still correct Lives with: lives with their family and lives with their spouse Lives in: House/apartment Stairs: No Has following equipment at home: Environmental consultant - 2 wheeled, Wheelchair (manual), Shower bench, bed side commode, and Grab bars  PLOF: Needs assistance with ADLs  PATIENT GOALS: Get out from bed to walker to bedside commode with or without LE braces donned, improve strength in UE/LE  OBJECTIVE:  Note: Objective measures were completed at Evaluation unless otherwise noted.  DIAGNOSTIC FINDINGS:   Via chart [facility] 05/13/2023 XR Femur 2 views right: "Impression  Unchanged position and alignment with further osseous bridging at the distal femur fracture following intramedullary rod fixation." And findings "FINDINGS:  Position and alignment of the comminuted distal femur fracture are unchanged after placement of a retrograde interlocking intramedullary rod. Proximal and distal interlocking components articulate appropriately with the rod. There is progressive osseous bridging with remodeling at the fracture site, there is no new fracture or  dislocation. "  COGNITION: Overall cognitive status: Within functional limits for tasks assessed   SENSATION: Reports impaired BLE, does have sensation on tops of thighs per report   COORDINATION: WFL UE rapid alt movement WFL LE with heel to shin, but unable to perform rapid alt movement due to weakness in bilat DFs  EDEMA:  Reports no swelling    POSTURE: rounded shoulders and forward head  LOWER EXTREMITY ROM:     Slight impairment with R ankle DF > L with PROM Slight impairment of L knee extension and somewhat painful    LOWER EXTREMITY MMT:    Grossly 4/5 in BLE exception is pt unable to complete DF bilat, other areas of significant mm deficit include hip flexors, abductors and hamstrings bilat  TRANSFERS:  Attempted STS to RW from WC during eval. Unable to perform from wheelchair even with max assist. Pt reports she has been able to complete STS from higher surfaces and this is how she practiced in [facility]  Assistive device utilized: Walker - 2 wheeled  Sit to stand:  unable from standard chair height/WC see above  FUNCTIONAL TESTS:  Attempted STS from [facility], unable at this time. Will attempt functional test like 30 sec STS in future as pt able  PATIENT SURVEYS:  LEFS 50/80 FOTO 54  TREATMENT DATE: 12/29/2023  Slide board transfer to mat table, set up assist only to the R side.  Sit<>stand x 6 from EOB of elevated mat table  with cues for decreased support from EOB on back of knees; performed with min/mod assist to prevent posterior bias.   Seated therex:  LAQ x 10  HS curls x 12 RTB  Hip abduction x 12 RTB    Gait with RW, AFO and KAFO 58ft x 2. Pt requesting seated rest break after short distances feeling "give out"  Cues for posture with gait due to flexed position in stance.   PT assessed BP due to excessive fatigue with gait:   Sitting 127/69 HR 94 Standing 113/68 HR 96. Unable to perform additional BP assessment in standing, as pt requesting to sit back in chair.  Of note; pt reports that she has only eaten a few crackers and some apple sauce today; Blood glucose monitor was also removed yesterday. Question possible low blood sugar.   Pt agreeable to perform nustep reciprocal movement and endurance training.   Stand pivot transfer  with RWto nustep with min-mod assist form elevated WC seat with airex in chair.    Nustep level 1 x 4+3 min BLE/BUE with L thigh support for last 3 min due to excessive hip ER.   Stand pivot transfer back to [facility] with mod assist to achieve full standing. UE supported on RWA   PATIENT EDUCATION: Education details: assessment findings, goals, plan, HEP. WC planning HEP expansion  Person educated: Patient and Spouse Education method: Explanation, Verbal cues, and Handouts Education comprehension: verbalized understanding and returned demonstration  HOME EXERCISE PROGRAM: Access Code: [email or web address] URL: [email or web address] Date: 12/29/2023 Prepared by: Temple Pacini  Exercises - Seated Hamstring Curls with Resistance  - 1 x daily - 5-6 x weekly - 3-4 sets - 20-25 reps - Seated Hip Abduction with Resistance  - 1 x daily - 5-6 x weekly - 3-4 sets - 20-25 reps - Seated March  - 1 x daily - 5-6 x weekly - 3-4 sets - 20-25 reps   Access Code: [email or web address] URL: [email or web address] Date: 01/26/2024 Prepared by: Grier Rocher  Exercises - Supine Heel Slide  - 1 x daily - 7 x weekly - 3 sets - 10 reps - Supine Straight Leg Raises  - 1 x daily - 7 x weekly - 3 sets - 10 reps - Hooklying Clamshell with Resistance  - 1 x daily - 7 x weekly - 3 sets - 10 reps - Supine Hip Flexion  - 1 x daily - 7 x weekly - 3 sets - 10 reps - Supine Single Leg Ankle Pumps  - 1 x daily - 7 x weekly - 3 sets - 10 reps - Supine Bridge  - 1 x daily - 7 x weekly - 3 sets - 10  reps GOALS: Goals reviewed with patient? Yes  SHORT TERM GOALS: Target date: 02/09/2024   Patient will be independent in home exercise program to improve strength/mobility for better functional independence with ADLs. Baseline: initiated Goal status: INITIAL    LONG TERM GOALS: Target date: 03/22/2024   Patient will perform sit to stand with min-mod assist from standard chair height in order to indicate improved lower extremity functional strength and improved ability to perform functional transfers and functional leg activities within her home.  Baseline: mod assist with +2 for safety.  Goal status: INITIAL  2.  Pt will improve LEFS by 10 points or more in order to  indicate improved LE function and mobility.  Baseline: 50 Goal status: INITIAL  3.  Patient will increase FOTO score to equal to or greater than  59   to demonstrate statistically significant improvement in mobility and quality of life.  Baseline: 54 Goal status: INITIAL  4.  Pt will require no more than min assist for transfer from bed to bedside commode Baseline: supervision assist with  slide board transfer. To mat table  Goal status: INITIAL  5.  The pt will demo ability to don socks and shoes indep or mod I for increased ease with dressing and improved independence Baseline: required max assist from husband  Goal status: INITIAL  6.  The pt will demo ability to complete 30 second STS test to indicate increased ease with transfers and BLE strength Baseline: 2 sit<>stand from [facility] with cushion in place and  +2 for safety.  Goal status: INITIAL   ASSESSMENT:  CLINICAL IMPRESSION: Patient is a pleasant 58 y.o. female who was seen today for physical therapy treatment for impairments related to R femur fx sustained in 2023. PT treatment focused on functional transfers from elevated surface, gait, and BLE strengthening. Pt limited due to feeling, "give out" . Question low blood sugar, as pt reports not eating much  today. Tolerated increased time on nustep for BLE strength and endurance. The pt will benefit from further skilled PT to improve these impairments in order to increase ease with ADLs, QOL and to reduce fall risk.   OBJECTIVE IMPAIRMENTS: Abnormal gait, decreased activity tolerance, decreased balance, decreased coordination, decreased endurance, decreased mobility, difficulty walking, decreased ROM, decreased strength, hypomobility, impaired sensation, improper body mechanics, postural dysfunction, and pain.   ACTIVITY LIMITATIONS: carrying, lifting, bending, standing, squatting, stairs, transfers, bed mobility, bathing, toileting, dressing, and locomotion level  PARTICIPATION LIMITATIONS: meal prep, cleaning, laundry, driving, shopping, community activity, and yard work  PERSONAL FACTORS: Fitness, Sex, Time since onset of injury/illness/exacerbation, and 3+ comorbidities: PMH per chart significant for arthritis, blood clotting disorder, DDD (lumbar), DM, headache, HTN, neuropathy, vertigo  are also affecting patient's functional outcome.   REHAB POTENTIAL: Good  CLINICAL DECISION MAKING: Evolving/moderate complexity  EVALUATION COMPLEXITY: Moderate  PLAN:  PT FREQUENCY: 1-2x/week  PT DURATION: 12 weeks  PLANNED INTERVENTIONS: 97164- PT Re-evaluation, 97110-Therapeutic exercises, 97530- Therapeutic activity, [id number]- Neuromuscular re-education, 97535- Self Care, 16109- Manual therapy, [id number]- Gait training, [phone]- Orthotic Fit/training, [id number]- Canalith repositioning, [id number]- Electrical stimulation (manual), [id number]- Ultrasound, Patient/Family education, Balance training, Stair training, Taping, Dry Needling, Joint mobilization, Spinal mobilization, Vestibular training, DME instructions, Wheelchair mobility training, Cryotherapy, and Moist heat  PLAN FOR NEXT SESSION:   Continue BLE strengthening. Gait as tolerated with AFO/KAFO  Golden Pop, PT 02/16/2024, 4:51 PM

## 2024-02-21 ENCOUNTER — Ambulatory Visit: Payer: BC Managed Care – PPO | Admitting: Physical Therapy

## 2024-02-21 DIAGNOSIS — E113592 Type 2 diabetes mellitus with proliferative diabetic retinopathy without macular edema, left eye: Secondary | ICD-10-CM | POA: Diagnosis not present

## 2024-02-22 ENCOUNTER — Ambulatory Visit: Payer: BC Managed Care – PPO | Admitting: Urology

## 2024-02-23 ENCOUNTER — Ambulatory Visit: Payer: BC Managed Care – PPO | Attending: Orthopedic Surgery | Admitting: Physical Therapy

## 2024-02-23 DIAGNOSIS — M5459 Other low back pain: Secondary | ICD-10-CM | POA: Insufficient documentation

## 2024-02-23 DIAGNOSIS — M6281 Muscle weakness (generalized): Secondary | ICD-10-CM | POA: Insufficient documentation

## 2024-02-23 DIAGNOSIS — R2689 Other abnormalities of gait and mobility: Secondary | ICD-10-CM | POA: Diagnosis not present

## 2024-02-23 DIAGNOSIS — R2681 Unsteadiness on feet: Secondary | ICD-10-CM | POA: Diagnosis not present

## 2024-02-23 DIAGNOSIS — R262 Difficulty in walking, not elsewhere classified: Secondary | ICD-10-CM | POA: Insufficient documentation

## 2024-02-23 NOTE — Progress Notes (Signed)
 02/24/2024 9:38 PM   Gabriella Marsh 06-May-1966 [id number]  Referring provider: Sherlyn Hay, DO [address] [place],  [place] [address]  Urological history: 1.  Urinary retention -Contributing factors of not ambulating, uncontrolled diabetes, lumbar DDD and obesity  Chief Complaint  Patient presents with   Other    Itching between legs    HPI: Gabriella Marsh is a 58 y.o. female who presents today for itching between the legs with her husband, Samuel Bouche.   Previous records reviewed.    She reached out to [place] via MyChart stating that she had developed an itching between her legs that she believes was from her bedpan and her husband not drying her off completely after cleansing.  She has become more diligent on her perineal care and the itching between her legs has basically abated.   She is becoming stronger and is able to walk with braces and a walker.  It is difficult for her though to get the braces on quickly, so when she has to urinate, she will use the bedpan.  Patient denies any modifying or aggravating factors.  Patient denies any recent UTI's, gross hematuria, dysuria or suprapubic/flank pain.  Patient denies any fevers, chills, nausea or vomiting.    PMH: Past Medical History:  Diagnosis Date   Arthritis    knees   Blood clotting disorder (HCC)    on toe    Degenerative disc disease, lumbar    Diabetes mellitus without complication (HCC)    GERD (gastroesophageal reflux disease)    Headache    daily - AM (has not been able to have SPG blocks lately)   Hyperlipidemia    Hypertension    Neuropathy    feet   Vertigo    3-4x/yr    Surgical History: Past Surgical History:  Procedure Laterality Date   ABDOMINAL HYSTERECTOMY     But still has cervix   AMPUTATION TOE Right 07/24/2021   Procedure: AMPUTATION TOE;  Surgeon: Linus Galas, DPM;  Location: [facility] ORS;  Service: Podiatry;  Laterality: Right;   CESAREAN SECTION      CHOLECYSTECTOMY     COLONOSCOPY WITH PROPOFOL N/A 02/20/2022   Procedure: COLONOSCOPY WITH PROPOFOL;  Surgeon: Wyline Mood, MD;  Location: [facility] ENDOSCOPY;  Service: Gastroenterology;  Laterality: N/A;   left arm frature  10/12/2020   OVARIAN CYST SURGERY     PARATHYROIDECTOMY  03/28/2021   Procedure: PARATHYROIDECTOMY AUTOTRANSPLANT;  Surgeon: Duanne Guess, MD;  Location: [facility] ORS;  Service: General;;   right femur surgery Right    SHOULDER SURGERY Left 12/26/014   Dr. Elfredia Nevins, [facility]   THYROIDECTOMY N/A 03/28/2021   Procedure: THYROIDECTOMY, total;  Surgeon: Duanne Guess, MD;  Location: [facility] ORS;  Service: General;  Laterality: N/A;  Provider requesting 3 hours /180 minutes for procedure   TONSILLECTOMY AND ADENOIDECTOMY     UTERINE FIBROID SURGERY      Home Medications:  Allergies as of 02/24/2024       Reactions   Celecoxib Shortness Of Breath   Pregabalin Shortness Of Breath   Buspar [buspirone] Other (See Comments)   Tachycardia   Citalopram Cough   Other Cough   [place] pear trees        Medication List        Accurate as of February 24, 2024 11:59 PM. If you have any questions, ask your nurse or doctor.          acetaminophen 500 MG tablet Commonly  known as: TYLENOL Take 1,000 mg by mouth every 12 (twelve) hours as needed for moderate pain.   atorvastatin 20 MG tablet Commonly known as: LIPITOR TAKE 1 TABLET(20 MG) BY MOUTH DAILY   cyclobenzaprine 5 MG tablet Commonly known as: FLEXERIL Take 1 tablet (5 mg total) by mouth 3 (three) times daily as needed for muscle spasms.   fluticasone 50 MCG/ACT nasal spray Commonly known as: FLONASE Place 2 sprays into both nostrils daily.   gabapentin 800 MG tablet Commonly known as: Neurontin Take 1 tablet (800 mg total) by mouth 3 (three) times daily.   insulin aspart 100 UNIT/ML FlexPen Commonly known as: NOVOLOG Inject 4 Units into the skin 3 (three) times daily with meals. Hold if not eating.    Insulin Pen Needle 30G X 5 MM Misc 1 each by Does not apply route in the morning, at noon, in the evening, and at bedtime.   Lantus SoloStar 100 UNIT/ML Solostar Pen Generic drug: insulin glargine Inject 24 Units into the skin at bedtime.   levothyroxine 112 MCG tablet Commonly known as: SYNTHROID Take 2 tablets (224 mcg total) by mouth daily.   lisinopril 2.5 MG tablet Commonly known as: ZESTRIL Take 1 tablet (2.5 mg total) by mouth daily.   Melatonin 10 MG Tabs Take 10 mg by mouth at bedtime.   metFORMIN 500 MG tablet Commonly known as: GLUCOPHAGE Take 2 tablets (1,000 mg total) by mouth 2 (two) times daily with a meal.   nortriptyline 50 MG capsule Commonly known as: PAMELOR Take 1 capsule (50 mg total) by mouth at bedtime.   nystatin-triamcinolone ointment Commonly known as: MYCOLOG Apply 1 Application topically 2 (two) times daily. Started by: Michiel Cowboy   omeprazole 20 MG capsule Commonly known as: PRILOSEC Take 1 capsule (20 mg total) by mouth daily. TAKE 1 CAPSULE DAILY   oxyCODONE-acetaminophen 5-325 MG tablet Commonly known as: Percocet Take 1 tablet by mouth every 8 (eight) hours. Must last 30 days.   oxyCODONE-acetaminophen 5-325 MG tablet Commonly known as: Percocet Take 1 tablet by mouth every 8 (eight) hours. Must last 30 days.   oxyCODONE-acetaminophen 5-325 MG tablet Commonly known as: Percocet Take 1 tablet by mouth every 8 (eight) hours. Must last 30 days. Start taking on: March 15, 2024   Semaglutide(0.25 or 0.5MG /DOS) 2 MG/3ML Sopn Inject 0.5 mg into the skin once a week.   traZODone 100 MG tablet Commonly known as: DESYREL Take 1 tablet (100 mg total) by mouth at bedtime.        Allergies:  Allergies  Allergen Reactions   Celecoxib Shortness Of Breath   Pregabalin Shortness Of Breath   Buspar [Buspirone] Other (See Comments)    Tachycardia   Citalopram Cough   Other Cough    [place] pear trees    Family  History: Family History  Problem Relation Age of Onset   Diabetes Father    Heart disease Father    Hypertension Father    Hyperlipidemia Father    Congestive Heart Failure Father    Healthy Brother    Osteoporosis Mother    Irritable bowel syndrome Mother    Osteoporosis Maternal Grandmother     Social History:  reports that she quit smoking about 12 years ago. Her smoking use included cigarettes. She has never used smokeless tobacco. She reports that she does not drink alcohol and does not use drugs.  ROS: Pertinent ROS in HPI  Physical Exam: BP 110/75   Pulse 87   Ht  5\' 10"  (1.778 m)   Wt 228 lb (103.4 kg)   BMI 32.71 kg/m   Constitutional:  Well nourished. Alert and oriented, No acute distress. HEENT: [place] AT, moist mucus membranes.  Trachea midline Cardiovascular: No clubbing, cyanosis, or edema. Respiratory: Normal respiratory effort, no increased work of breathing. GU: She has a light pink rash in the perineal area.   Neurologic: Grossly intact, no focal deficits, moving all 4 extremities. Psychiatric: Normal mood and affect.    Laboratory Data: Component     Latest Ref Rng 10/11/2023  Hemoglobin A1C     4.8 - 5.6 % 9.1 (H)   Est. average glucose Bld gHb Est-mCnc     mg/dL 981     Legend: (H) High I have reviewed the labs.  See HPI.      Pertinent Imaging: N/A    Assessment & Plan:    1. Urinary retention -resolved   2.  Tinea cruris -she has mostly solved the issue with changing her perineal care techniques -Mycolog cream BID prn   Return for keep September appointment .  These notes generated with voice recognition software. I apologize for typographical errors.  Cloretta Ned  [facility] [address]  [address] [place], [place] [address] [phone]

## 2024-02-23 NOTE — Therapy (Signed)
 OUTPATIENT PHYSICAL THERAPY NEURO treatment   Patient Name: Gabriella Marsh MRN: [id number] DOB:01-31-1966, 58 y.o., female Today's Date: 02/23/2024   PCP: Sherlyn Hay, DO REFERRING PROVIDER: Cannon Kettle, MD  END OF SESSION:  PT End of Session - 02/23/24 1628     Visit Number 6    Number of Visits 25    Date for PT Re-Evaluation 03/22/24    Progress Note Due on Visit 10    PT Start Time 1620    PT Stop Time 1700    PT Time Calculation (min) 40 min    Equipment Utilized During Treatment Gait belt    Activity Tolerance Patient tolerated treatment well    Behavior During Therapy WFL for tasks assessed/performed             Past Medical History:  Diagnosis Date   Arthritis    knees   Blood clotting disorder (HCC)    on toe    Degenerative disc disease, lumbar    Diabetes mellitus without complication (HCC)    GERD (gastroesophageal reflux disease)    Headache    daily - AM (has not been able to have SPG blocks lately)   Hyperlipidemia    Hypertension    Neuropathy    feet   Vertigo    3-4x/yr   Past Surgical History:  Procedure Laterality Date   ABDOMINAL HYSTERECTOMY     But still has cervix   AMPUTATION TOE Right 07/24/2021   Procedure: AMPUTATION TOE;  Surgeon: Linus Galas, DPM;  Location: [facility] OR[facility];  Service: Podiatry;  Laterality: Right;   CESAREAN SECTION     CHOLECYSTECTOMY     COLONOSCOPY WITH PROPOFOL N/A 02/20/2022   Procedure: COLONOSCOPY WITH PROPOFOL;  Surgeon: Wyline Mood, MD;  Location: [facility];  Service: Gastroenterology;  Laterality: N/A;   left arm frature  10/12/2020   OVARIAN CYST SURGERY     PARATHYROIDECTOMY  03/28/2021   Procedure: PARATHYROIDECTOMY AUTOTRANSPLANT;  Surgeon: Duanne Guess, MD;  Location: [facility] OR[facility];  Service: General;;   right femur surgery Right    SHOULDER SURGERY Left 12/26/014   Dr. Elfredia Nevins, [facility]   THYROIDECTOMY N/A 03/28/2021   Procedure: THYROIDECTOMY, total;  Surgeon: Duanne Guess,  MD;  Location: [facility] ORS;  Service: General;  Laterality: N/A;  Provider requesting 3 hours /180 minutes for procedure   TONSILLECTOMY AND ADENOIDECTOMY     UTERINE FIBROID SURGERY     Patient Active Problem List   Diagnosis Date Noted   Wheelchair dependence 03/10/2023   Hypertension associated with diabetes (HCC) 03/10/2023   AKI (acute kidney injury) (HCC) 01/07/2023   Neurogenic bladder 01/07/2023   FTT (failure to thrive) in adult 12/09/2022   Primary insomnia 09/10/2022   Impaired functional mobility, balance, gait, and endurance 06/09/2022   Type 2 diabetes mellitus with diabetic neuropathy, with long-term current use of insulin (HCC) 04/24/2022   Hypothyroidism 04/24/2022   Hyperlipidemia associated with type 2 diabetes mellitus (HCC) 04/24/2022   Iron deficiency anemia 04/24/2022   Chronic sensorimotor polyneuropathy with axonal and demyelinating features 01/12/2022   Osteoarthritis of knees (Bilateral) 01/12/2022   Tricompartment osteoarthritis of knees (Bilateral) 01/12/2022    Grade 1 Retrolisthesis (9mm) of L5 over S1 01/12/2022   History of proximal humerus fracture (Left) 01/12/2022   Vitamin D deficiency 01/12/2022   Chronic peripheral neuropathic pain 01/12/2022   Disorder of skeletal system 11/12/2021   Problems influencing health status 11/12/2021   Chronic hand pain (1ry area of Pain) (Bilateral) (  L>R) 11/12/2021   Chronic feet pain (3ry area of Pain) (Bilateral) (L>R) 11/12/2021   Chronic shoulder pain (4th area of Pain) (Left) 11/12/2021   Chronic knee pain (5th area of Pain) (Bilateral) (L>R) 11/12/2021   Lumbar facet syndrome (Bilateral) 11/12/2021   COVID-19 virus infection 07/22/2021   Chronic pain syndrome 07/17/2021   S/P total thyroidectomy 03/28/2021   Thyroid nodule    Schamberg's purpura 10/09/2019   Common migraine with intractable migraine 06/18/2015   DDD (degenerative disc disease), lumbosacral 05/03/2015   Insomnia due to medical condition  05/03/2015   Disorder of peripheral nervous system 05/03/2015   Diabetic peripheral neuropathy (HCC) 03/06/2015   Obesity 03/06/2015   GERD (gastroesophageal reflux disease) 03/06/2015   Multinodular goiter 03/06/2015   RLS (restless legs syndrome) 03/06/2015   Chronic low back pain (2ry area of Pain) (Bilateral) (L>R) w/o sciatica 03/06/2015    ONSET DATE: 06/18/22  REFERRING DIAG: S72.91XD (ICD-10-CM) - Unspecified fracture of right femur, subsequent encounter for closed fracture with routine healing   THERAPY DIAG:  Muscle weakness (generalized)  Unsteadiness on feet  Difficulty in walking, not elsewhere classified  Rationale for Evaluation and Treatment: Rehabilitation  SUBJECTIVE:                                                                                                                                                                                             SUBJECTIVE STATEMENT:   Pt reports that she is doing well, feeling good today, no new updates since last session.   Pt accompanied by:  Spouse   PERTINENT HISTORY: Pt is a pleasant 58 y/o female known to PT clinic, returning for impairments due to R femur fx sustained in 2023. Pt hx includes fall in June 2023 where pt suffered R femur fx followed by surgery where pt had plate and screws placed in R knee and rod in R femur. At the time pt with hx of multiple falls, now pt reports no falls in last 6 months. Since previous d/c from PT on 05/13/2023, pt has been consistently going to [facility] clinic 1x/week, and now returns to [facility] for new year. Pt reports she ambulated with RW while at [facility] clinic, has been able to perform STS to RW from higher surfaces. She would like to continue working on her mobility and strength, including both UE and LE strength. She still struggles with putting on her shoes and socks and getting from her bed to the commode. She reports standing at [facility] also limited due to chronic low back pain. She reports  she'll likely get injections to tx LBP in future. Pt still  performing previous HEP given, typically she completes 1 set of each exercise for 25-30 reps. Says this is easy. PMH per chart significant for arthritis, blood clotting disorder, DDD (lumbar), DM, headache, HTN, neuropathy, vertigo   PAIN:  Are you having pain? Yes: NPRS scale: not numerically rated Pain location: low back, bilat hand pain "hurts a lot all the time"  Pain description:   Aggravating factors: standing limited due to LBP in position Relieving factors: wearing gloves helps with hand pain  PRECAUTIONS: Fall  RED FLAGS: None   WEIGHT BEARING RESTRICTIONS: No  FALLS: Has patient fallen in last 6 months? No  LIVING ENVIRONMENT: via chart, pt confirms the following is still correct Lives with: lives with their family and lives with their spouse Lives in: House/apartment Stairs: No Has following equipment at home: Environmental consultant - 2 wheeled, Wheelchair (manual), Shower bench, bed side commode, and Grab bars  PLOF: Needs assistance with ADLs  PATIENT GOALS: Get out from bed to walker to bedside commode with or without LE braces donned, improve strength in UE/LE  OBJECTIVE:  Note: Objective measures were completed at Evaluation unless otherwise noted.  DIAGNOSTIC FINDINGS:   Via chart [facility] care 05/13/2023 XR Femur 2 views right: "Impression  Unchanged position and alignment with further osseous bridging at the distal femur fracture following intramedullary rod fixation." And findings "FINDINGS:  Position and alignment of the comminuted distal femur fracture are unchanged after placement of a retrograde interlocking intramedullary rod. Proximal and distal interlocking components articulate appropriately with the rod. There is progressive osseous bridging with remodeling at the fracture site, there is no new fracture or dislocation. "  COGNITION: Overall cognitive status: Within functional limits for tasks  assessed   SENSATION: Reports impaired BLE, does have sensation on tops of thighs per report   COORDINATION: WFL UE rapid alt movement WFL LE with heel to shin, but unable to perform rapid alt movement due to weakness in bilat DFs  EDEMA:  Reports no swelling    POSTURE: rounded shoulders and forward head  LOWER EXTREMITY ROM:     Slight impairment with R ankle DF > L with PROM Slight impairment of L knee extension and somewhat painful    LOWER EXTREMITY MMT:    Grossly 4/5 in BLE exception is pt unable to complete DF bilat, other areas of significant mm deficit include hip flexors, abductors and hamstrings bilat  TRANSFERS:  Attempted STS to RW from WC during eval. Unable to perform from wheelchair even with max assist. Pt reports she has been able to complete STS from higher surfaces and this is how she practiced in [facility]  Assistive device utilized: Walker - 2 wheeled  Sit to stand:  unable from standard chair height/WC see above  FUNCTIONAL TESTS:  Attempted STS from [facility], unable at this time. Will attempt functional test like 30 sec STS in future as pt able  PATIENT SURVEYS:  LEFS 50/80 FOTO 54  TREATMENT DATE: 12/29/2023  Slide board transfer to mat table, set up assist only to the R side.  Sit<>stand x 5 from EOB of elevated mat table  with cues for decreased support from EOB on back of knees; performed with min A to cga       Gait with RW, AFO and KAFO 20 ft, REST, 32 ft, REST, 20 ft, REST, 9 ft - took off R shoe lift to help with R foot eversion, improved some but still present. Recommended T strap for R AFO to prevent ankle eversion/   Stance with R 3 degree decline to cause inversion bias. Still R foot eversion.   Stand pivot transfer  with RW to nustep with min-mod assist form elevated WC seat with airex in chair.  X 30 sec    Nustep level 1 x 70 min  BLE/BUE cues to prevent R hip ER   Stand pivot transfer back to [facility] with mod assist to achieve full standing. UE supported on RWA   PATIENT EDUCATION: Education details: assessment findings, goals, plan, HEP. WC planning HEP expansion  Person educated: Patient and Spouse Education method: Explanation, Verbal cues, and Handouts Education comprehension: verbalized understanding and returned demonstration  HOME EXERCISE PROGRAM: Access Code: [email or web address] URL: [email or web address] Date: 12/29/2023 Prepared by: Temple Pacini  Exercises - Seated Hamstring Curls with Resistance  - 1 x daily - 5-6 x weekly - 3-4 sets - 20-25 reps - Seated Hip Abduction with Resistance  - 1 x daily - 5-6 x weekly - 3-4 sets - 20-25 reps - Seated March  - 1 x daily - 5-6 x weekly - 3-4 sets - 20-25 reps   Access Code: [email or web address] URL: [email or web address] Date: 01/26/2024 Prepared by: Grier Rocher  Exercises - Supine Heel Slide  - 1 x daily - 7 x weekly - 3 sets - 10 reps - Supine Straight Leg Raises  - 1 x daily - 7 x weekly - 3 sets - 10 reps - Hooklying Clamshell with Resistance  - 1 x daily - 7 x weekly - 3 sets - 10 reps - Supine Hip Flexion  - 1 x daily - 7 x weekly - 3 sets - 10 reps - Supine Single Leg Ankle Pumps  - 1 x daily - 7 x weekly - 3 sets - 10 reps - Supine Bridge  - 1 x daily - 7 x weekly - 3 sets - 10 reps GOALS: Goals reviewed with patient? Yes  SHORT TERM GOALS: Target date: 02/09/2024   Patient will be independent in home exercise program to improve strength/mobility for better functional independence with ADLs. Baseline: initiated Goal status: INITIAL    LONG TERM GOALS: Target date: 03/22/2024   Patient will perform sit to stand with min-mod assist from standard chair height in order to indicate improved lower extremity functional strength and improved ability to perform functional transfers and functional leg activities  within her home.  Baseline: mod assist with +2 for safety.  Goal status: INITIAL  2.  Pt will improve LEFS by 10 points or more in order to indicate improved LE function and mobility.  Baseline: 50 Goal status: INITIAL  3.  Patient will increase FOTO score to equal to or greater than  59   to demonstrate statistically significant improvement in mobility and quality of life.  Baseline: 54 Goal status: INITIAL  4.  Pt will require no more than min assist for transfer from bed to bedside commode Baseline: supervision assist with  slide  board transfer. To mat table  Goal status: INITIAL  5.  The pt will demo ability to don socks and shoes indep or mod I for increased ease with dressing and improved independence Baseline: required max assist from husband  Goal status: INITIAL  6.  The pt will demo ability to complete 30 second STS test to indicate increased ease with transfers and BLE strength Baseline: 2 sit<>stand from [facility] with cushion in place and  +2 for safety.  Goal status: INITIAL   ASSESSMENT:  CLINICAL IMPRESSION:  Patient is a pleasant 58 y.o. female who was seen today for physical therapy treatment for impairments related to R femur fx sustained in 2023. PT treatment focused on functional transfers from elevated surface, gait, and BLE strengthening. Pt demonstrated improved tolerance for ambulation this date. Pt ambulating further distances and trailed without R shoe lift to improve ankle eversion this helped some but she will need an addition to her R AFO ( t-strap) to truly fix this. The pt will benefit from further skilled PT to improve these impairments in order to increase ease with ADLs, QOL and to reduce fall risk.   OBJECTIVE IMPAIRMENTS: Abnormal gait, decreased activity tolerance, decreased balance, decreased coordination, decreased endurance, decreased mobility, difficulty walking, decreased ROM, decreased strength, hypomobility, impaired sensation, improper body  mechanics, postural dysfunction, and pain.   ACTIVITY LIMITATIONS: carrying, lifting, bending, standing, squatting, stairs, transfers, bed mobility, bathing, toileting, dressing, and locomotion level  PARTICIPATION LIMITATIONS: meal prep, cleaning, laundry, driving, shopping, community activity, and yard work  PERSONAL FACTORS: Fitness, Sex, Time since onset of injury/illness/exacerbation, and 3+ comorbidities: PMH per chart significant for arthritis, blood clotting disorder, DDD (lumbar), DM, headache, HTN, neuropathy, vertigo  are also affecting patient's functional outcome.   REHAB POTENTIAL: Good  CLINICAL DECISION MAKING: Evolving/moderate complexity  EVALUATION COMPLEXITY: Moderate  PLAN:  PT FREQUENCY: 1-2x/week  PT DURATION: 12 weeks  PLANNED INTERVENTIONS: [id number]- PT Re-evaluation, 97110-Therapeutic exercises, 97530- Therapeutic activity, [id number]- Neuromuscular re-education, 97535- Self Care, [id number]- Manual therapy, [id number]- Gait training, [phone]- Orthotic Fit/training, [phone]- Canalith repositioning, [id number]- Electrical stimulation (manual), [id number]- Ultrasound, Patient/Family education, Balance training, Stair training, Taping, Dry Needling, Joint mobilization, Spinal mobilization, Vestibular training, DME instructions, Wheelchair mobility training, Cryotherapy, and Moist heat  PLAN FOR NEXT SESSION:   Continue BLE strengthening. Gait as tolerated with AFO/KAFO  Norman Herrlich, PT 02/23/2024, 4:29 PM

## 2024-02-24 ENCOUNTER — Ambulatory Visit (INDEPENDENT_AMBULATORY_CARE_PROVIDER_SITE_OTHER): Payer: BC Managed Care – PPO | Admitting: Urology

## 2024-02-24 ENCOUNTER — Encounter: Payer: Self-pay | Admitting: Urology

## 2024-02-24 VITALS — BP 110/75 | HR 87 | Ht 70.0 in | Wt 228.0 lb

## 2024-02-24 DIAGNOSIS — B356 Tinea cruris: Secondary | ICD-10-CM | POA: Diagnosis not present

## 2024-02-24 DIAGNOSIS — R339 Retention of urine, unspecified: Secondary | ICD-10-CM

## 2024-02-24 MED ORDER — NYSTATIN-TRIAMCINOLONE 100000-0.1 UNIT/GM-% EX OINT
1.0000 | TOPICAL_OINTMENT | Freq: Two times a day (BID) | CUTANEOUS | 2 refills | Status: AC
Start: 2024-02-24 — End: ?

## 2024-02-28 ENCOUNTER — Ambulatory Visit: Payer: BC Managed Care – PPO | Admitting: Physical Therapy

## 2024-03-01 ENCOUNTER — Ambulatory Visit: Payer: BC Managed Care – PPO | Admitting: Physical Therapy

## 2024-03-01 DIAGNOSIS — M6281 Muscle weakness (generalized): Secondary | ICD-10-CM

## 2024-03-01 DIAGNOSIS — R2681 Unsteadiness on feet: Secondary | ICD-10-CM | POA: Diagnosis not present

## 2024-03-01 DIAGNOSIS — R262 Difficulty in walking, not elsewhere classified: Secondary | ICD-10-CM

## 2024-03-01 DIAGNOSIS — M5459 Other low back pain: Secondary | ICD-10-CM | POA: Diagnosis not present

## 2024-03-01 DIAGNOSIS — R2689 Other abnormalities of gait and mobility: Secondary | ICD-10-CM

## 2024-03-01 NOTE — Therapy (Unsigned)
 OUTPATIENT PHYSICAL THERAPY NEURO treatment   Patient Name: Gabriella Marsh MRN: [id number] DOB:11/25/1966, 58 y.o., female Today's Date: 03/01/2024   PCP: Sherlyn Hay, DO REFERRING PROVIDER: Cannon Kettle, MD  END OF SESSION:  PT End of Session - 03/01/24 1640     Visit Number 7    Number of Visits 25    Date for PT Re-Evaluation 03/22/24    Progress Note Due on Visit 10    PT Start Time 1617    PT Stop Time 1657    PT Time Calculation (min) 40 min    Equipment Utilized During Treatment Gait belt    Activity Tolerance Patient tolerated treatment well    Behavior During Therapy WFL for tasks assessed/performed             Past Medical History:  Diagnosis Date   Arthritis    knees   Blood clotting disorder (HCC)    on toe    Degenerative disc disease, lumbar    Diabetes mellitus without complication (HCC)    GERD (gastroesophageal reflux disease)    Headache    daily - AM (has not been able to have SPG blocks lately)   Hyperlipidemia    Hypertension    Neuropathy    feet   Vertigo    3-4x/yr   Past Surgical History:  Procedure Laterality Date   ABDOMINAL HYSTERECTOMY     But still has cervix   AMPUTATION TOE Right 07/24/2021   Procedure: AMPUTATION TOE;  Surgeon: Linus Galas, DPM;  Location: [facility] ORS;  Service: Podiatry;  Laterality: Right;   CESAREAN SECTION     CHOLECYSTECTOMY     COLONOSCOPY WITH PROPOFOL N/A 02/20/2022   Procedure: COLONOSCOPY WITH PROPOFOL;  Surgeon: Wyline Mood, MD;  Location: [facility] ENDOSCOPY;  Service: Gastroenterology;  Laterality: N/A;   left arm frature  10/12/2020   OVARIAN CYST SURGERY     PARATHYROIDECTOMY  03/28/2021   Procedure: PARATHYROIDECTOMY AUTOTRANSPLANT;  Surgeon: Duanne Guess, MD;  Location: [facility] ORS;  Service: General;;   right femur surgery Right    SHOULDER SURGERY Left 12/26/014   Dr. Elfredia Nevins, [facility]   THYROIDECTOMY N/A 03/28/2021   Procedure: THYROIDECTOMY, total;  Surgeon: Duanne Guess, MD;  Location: [facility] ORS;  Service: General;  Laterality: N/A;  Provider requesting 3 hours /180 minutes for procedure   TONSILLECTOMY AND ADENOIDECTOMY     UTERINE FIBROID SURGERY     Patient Active Problem List   Diagnosis Date Noted   Wheelchair dependence 03/10/2023   Hypertension associated with diabetes (HCC) 03/10/2023   AKI (acute kidney injury) (HCC) 01/07/2023   Neurogenic bladder 01/07/2023   FTT (failure to thrive) in adult 12/09/2022   Primary insomnia 09/10/2022   Impaired functional mobility, balance, gait, and endurance 06/09/2022   Type 2 diabetes mellitus with diabetic neuropathy, with long-term current use of insulin (HCC) 04/24/2022   Hypothyroidism 04/24/2022   Hyperlipidemia associated with type 2 diabetes mellitus (HCC) 04/24/2022   Iron deficiency anemia 04/24/2022   Chronic sensorimotor polyneuropathy with axonal and demyelinating features 01/12/2022   Osteoarthritis of knees (Bilateral) 01/12/2022   Tricompartment osteoarthritis of knees (Bilateral) 01/12/2022    Grade 1 Retrolisthesis (9mm) of L5 over S1 01/12/2022   History of proximal humerus fracture (Left) 01/12/2022   Vitamin D deficiency 01/12/2022   Chronic peripheral neuropathic pain 01/12/2022   Disorder of skeletal system 11/12/2021   Problems influencing health status 11/12/2021   Chronic hand pain (1ry area of Pain) (Bilateral) (  L>R) 11/12/2021   Chronic feet pain (3ry area of Pain) (Bilateral) (L>R) 11/12/2021   Chronic shoulder pain (4th area of Pain) (Left) 11/12/2021   Chronic knee pain (5th area of Pain) (Bilateral) (L>R) 11/12/2021   Lumbar facet syndrome (Bilateral) 11/12/2021   COVID-19 virus infection 07/22/2021   Chronic pain syndrome 07/17/2021   S/P total thyroidectomy 03/28/2021   Thyroid nodule    Schamberg's purpura 10/09/2019   Common migraine with intractable migraine 06/18/2015   DDD (degenerative disc disease), lumbosacral 05/03/2015   Insomnia due to medical  condition 05/03/2015   Disorder of peripheral nervous system 05/03/2015   Diabetic peripheral neuropathy (HCC) 03/06/2015   Obesity 03/06/2015   GERD (gastroesophageal reflux disease) 03/06/2015   Multinodular goiter 03/06/2015   RLS (restless legs syndrome) 03/06/2015   Chronic low back pain (2ry area of Pain) (Bilateral) (L>R) w/o sciatica 03/06/2015    ONSET DATE: 06/18/22  REFERRING DIAG: S72.91XD (ICD-10-CM) - Unspecified fracture of right femur, subsequent encounter for closed fracture with routine healing   THERAPY DIAG:  Muscle weakness (generalized)  Unsteadiness on feet  Difficulty in walking, not elsewhere classified  Other low back pain  Other abnormalities of gait and mobility  Rationale for Evaluation and Treatment: Rehabilitation  SUBJECTIVE:                                                                                                                                                                                             SUBJECTIVE STATEMENT:   Pt reports that she is doing well, feeling good today, states that [facility] to [facility] fit for Tstrap to stabilize R ankle.  no other new updates since last session.   Pt accompanied by:  Spouse   PERTINENT HISTORY: Pt is a pleasant 58 y/o female known to PT clinic, returning for impairments due to R femur fx sustained in 2023. Pt hx includes fall in June 2023 where pt suffered R femur fx followed by surgery where pt had plate and screws placed in R knee and rod in R femur. At the time pt with hx of multiple falls, now pt reports no falls in last 6 months. Since previous d/c from PT on 05/13/2023, pt has been consistently going to [facility] clinic 1x/week, and now returns to [facility] for new year. Pt reports she ambulated with RW while at [facility] clinic, has been able to perform STS to RW from higher surfaces. She would like to continue working on her mobility and strength, including both UE and  LE strength. She still struggles with putting on her shoes and socks and  getting from her bed to the commode. She reports standing at [facility] also limited due to chronic low back pain. She reports she'll likely get injections to tx LBP in future. Pt still performing previous HEP given, typically she completes 1 set of each exercise for 25-30 reps. Says this is easy. PMH per chart significant for arthritis, blood clotting disorder, DDD (lumbar), DM, headache, HTN, neuropathy, vertigo   PAIN:  Are you having pain? Yes: NPRS scale: not numerically rated Pain location: low back, bilat hand pain "hurts a lot all the time"  Pain description:   Aggravating factors: standing limited due to LBP in position Relieving factors: wearing gloves helps with hand pain  PRECAUTIONS: Fall  RED FLAGS: None   WEIGHT BEARING RESTRICTIONS: No  FALLS: Has patient fallen in last 6 months? No  LIVING ENVIRONMENT: via chart, pt confirms the following is still correct Lives with: lives with their family and lives with their spouse Lives in: House/apartment Stairs: No Has following equipment at home: Environmental consultant - 2 wheeled, Wheelchair (manual), Shower bench, bed side commode, and Grab bars  PLOF: Needs assistance with ADLs  PATIENT GOALS: Get out from bed to walker to bedside commode with or without LE braces donned, improve strength in UE/LE  OBJECTIVE:  Note: Objective measures were completed at Evaluation unless otherwise noted.  DIAGNOSTIC FINDINGS:   Via chart [facility] 05/13/2023 XR Femur 2 views right: "Impression  Unchanged position and alignment with further osseous bridging at the distal femur fracture following intramedullary rod fixation." And findings "FINDINGS:  Position and alignment of the comminuted distal femur fracture are unchanged after placement of a retrograde interlocking intramedullary rod. Proximal and distal interlocking components articulate appropriately with the rod. There is  progressive osseous bridging with remodeling at the fracture site, there is no new fracture or dislocation. "  COGNITION: Overall cognitive status: Within functional limits for tasks assessed   SENSATION: Reports impaired BLE, does have sensation on tops of thighs per report   COORDINATION: WFL UE rapid alt movement WFL LE with heel to shin, but unable to perform rapid alt movement due to weakness in bilat DFs  EDEMA:  Reports no swelling    POSTURE: rounded shoulders and forward head  LOWER EXTREMITY ROM:     Slight impairment with R ankle DF > L with PROM Slight impairment of L knee extension and somewhat painful    LOWER EXTREMITY MMT:    Grossly 4/5 in BLE exception is pt unable to complete DF bilat, other areas of significant mm deficit include hip flexors, abductors and hamstrings bilat  TRANSFERS:  Attempted STS to RW from WC during eval. Unable to perform from wheelchair even with max assist. Pt reports she has been able to complete STS from higher surfaces and this is how she practiced in [facility]  Assistive device utilized: Walker - 2 wheeled  Sit to stand:  unable from standard chair height/WC see above  FUNCTIONAL TESTS:  Attempted STS from [facility], unable at this time. Will attempt functional test like 30 sec STS in future as pt able  PATIENT SURVEYS:  LEFS 50/80 FOTO 54  TREATMENT DATE: 12/29/2023  Slide board transfer to mat table, set up assist only to the R side.  Sit<>stand from [facility] throughout session with mod assist overall and PT to stabilize RW. Performed approximately 8 times throughout session    Gait with RW, AFO and KAFO 30, 45, and 69ft  CGA form PT for safety with cues for step width to reduce rolling of the R ankle in AFO and reduce pressure on lateral malleoli.     Stand pivot transfer  with RW to nustep with min-mod  assist form elevated WC seat with airex in chair.  Nustep level 1 x 3 min  BLE/BUE cues to prevent R hip ER  x 2 min BLE only, x 1 min BUEl/BLE. Tactile cues for hip activation without UE support   Stand pivot transfer back to [facility] with mod assist to achieve full standing. UE supported on RW.    PATIENT EDUCATION: Education details: assessment findings, goals, plan, HEP. WC planning HEP expansion  Person educated: Patient and Spouse Education method: Explanation, Verbal cues, and Handouts Education comprehension: verbalized understanding and returned demonstration  HOME EXERCISE PROGRAM: Access Code: [email or web address] URL: [email or web address] Date: 12/29/2023 Prepared by: Temple Pacini  Exercises - Seated Hamstring Curls with Resistance  - 1 x daily - 5-6 x weekly - 3-4 sets - 20-25 reps - Seated Hip Abduction with Resistance  - 1 x daily - 5-6 x weekly - 3-4 sets - 20-25 reps - Seated March  - 1 x daily - 5-6 x weekly - 3-4 sets - 20-25 reps   Access Code: [email or web address] URL: [email or web address] Date: 01/26/2024 Prepared by: Grier Rocher  Exercises - Supine Heel Slide  - 1 x daily - 7 x weekly - 3 sets - 10 reps - Supine Straight Leg Raises  - 1 x daily - 7 x weekly - 3 sets - 10 reps - Hooklying Clamshell with Resistance  - 1 x daily - 7 x weekly - 3 sets - 10 reps - Supine Hip Flexion  - 1 x daily - 7 x weekly - 3 sets - 10 reps - Supine Single Leg Ankle Pumps  - 1 x daily - 7 x weekly - 3 sets - 10 reps - Supine Bridge  - 1 x daily - 7 x weekly - 3 sets - 10 reps GOALS: Goals reviewed with patient? Yes  SHORT TERM GOALS: Target date: 02/09/2024   Patient will be independent in home exercise program to improve strength/mobility for better functional independence with ADLs. Baseline: initiated Goal status: INITIAL    LONG TERM GOALS: Target date: 03/22/2024   Patient will perform sit to stand with min-mod assist from standard chair height in order to  indicate improved lower extremity functional strength and improved ability to perform functional transfers and functional leg activities within her home.  Baseline: mod assist with +2 for safety.  Goal status: INITIAL  2.  Pt will improve LEFS by 10 points or more in order to indicate improved LE function and mobility.  Baseline: 50 Goal status: INITIAL  3.  Patient will increase FOTO score to equal to or greater than  59   to demonstrate statistically significant improvement in mobility and quality of life.  Baseline: 54 Goal status: INITIAL  4.  Pt will require no more than min assist for transfer from bed to bedside commode Baseline: supervision assist with  slide board transfer. To mat table  Goal status: INITIAL  5.  The pt will demo ability to  don socks and shoes indep or mod I for increased ease with dressing and improved independence Baseline: required max assist from husband  Goal status: INITIAL  6.  The pt will demo ability to complete 30 second STS test to indicate increased ease with transfers and BLE strength Baseline: 2 sit<>stand from [facility] with cushion in place and  +2 for safety.  Goal status: INITIAL   ASSESSMENT:  CLINICAL IMPRESSION:  Patient is a pleasant 58 y.o. female who was seen today for physical therapy treatment for impairments related to R femur fx sustained in 2023. PT treatment focused on improved activity tolerance to standing and improved gait pattern as well as improved reciprocal movement training on nustep with BUE/BLE and BLE only. Improved independence with sit<>stand from [facility] with cushion in place. The pt will benefit from further skilled PT to improve these impairments in order to increase ease with ADLs, QOL and to reduce fall risk.   OBJECTIVE IMPAIRMENTS: Abnormal gait, decreased activity tolerance, decreased balance, decreased coordination, decreased endurance, decreased mobility, difficulty walking, decreased ROM, decreased strength,  hypomobility, impaired sensation, improper body mechanics, postural dysfunction, and pain.   ACTIVITY LIMITATIONS: carrying, lifting, bending, standing, squatting, stairs, transfers, bed mobility, bathing, toileting, dressing, and locomotion level  PARTICIPATION LIMITATIONS: meal prep, cleaning, laundry, driving, shopping, community activity, and yard work  PERSONAL FACTORS: Fitness, Sex, Time since onset of injury/illness/exacerbation, and 3+ comorbidities: PMH per chart significant for arthritis, blood clotting disorder, DDD (lumbar), DM, headache, HTN, neuropathy, vertigo  are also affecting patient's functional outcome.   REHAB POTENTIAL: Good  CLINICAL DECISION MAKING: Evolving/moderate complexity  EVALUATION COMPLEXITY: Moderate  PLAN:  PT FREQUENCY: 1-2x/week  PT DURATION: 12 weeks  PLANNED INTERVENTIONS: [id number]- PT Re-evaluation, 97110-Therapeutic exercises, 97530- Therapeutic activity, [id number]- Neuromuscular re-education, 97535- Self Care, 11914- Manual therapy, [id number]- Gait training, [phone]- Orthotic Fit/training, [phone]- Canalith repositioning, [id number]- Electrical stimulation (manual), [id number]- Ultrasound, Patient/Family education, Balance training, Stair training, Taping, Dry Needling, Joint mobilization, Spinal mobilization, Vestibular training, DME instructions, Wheelchair mobility training, Cryotherapy, and Moist heat  PLAN FOR NEXT SESSION:   Continue BLE strengthening. Gait as tolerated with AFO/KAFO  Golden Pop, PT 03/01/2024, 4:41 PM

## 2024-03-02 DIAGNOSIS — E1165 Type 2 diabetes mellitus with hyperglycemia: Secondary | ICD-10-CM | POA: Diagnosis not present

## 2024-03-03 DIAGNOSIS — B351 Tinea unguium: Secondary | ICD-10-CM | POA: Diagnosis not present

## 2024-03-03 DIAGNOSIS — L97509 Non-pressure chronic ulcer of other part of unspecified foot with unspecified severity: Secondary | ICD-10-CM | POA: Diagnosis not present

## 2024-03-03 DIAGNOSIS — E11621 Type 2 diabetes mellitus with foot ulcer: Secondary | ICD-10-CM | POA: Diagnosis not present

## 2024-03-03 DIAGNOSIS — Z89411 Acquired absence of right great toe: Secondary | ICD-10-CM | POA: Diagnosis not present

## 2024-03-06 ENCOUNTER — Ambulatory Visit: Payer: BC Managed Care – PPO | Admitting: Physical Therapy

## 2024-03-08 ENCOUNTER — Ambulatory Visit: Payer: BC Managed Care – PPO | Admitting: Physical Therapy

## 2024-03-08 ENCOUNTER — Other Ambulatory Visit: Payer: Self-pay | Admitting: Family Medicine

## 2024-03-08 DIAGNOSIS — R262 Difficulty in walking, not elsewhere classified: Secondary | ICD-10-CM | POA: Diagnosis not present

## 2024-03-08 DIAGNOSIS — R2689 Other abnormalities of gait and mobility: Secondary | ICD-10-CM

## 2024-03-08 DIAGNOSIS — M6281 Muscle weakness (generalized): Secondary | ICD-10-CM | POA: Diagnosis not present

## 2024-03-08 DIAGNOSIS — R2681 Unsteadiness on feet: Secondary | ICD-10-CM

## 2024-03-08 DIAGNOSIS — E1140 Type 2 diabetes mellitus with diabetic neuropathy, unspecified: Secondary | ICD-10-CM

## 2024-03-08 DIAGNOSIS — Z794 Long term (current) use of insulin: Secondary | ICD-10-CM

## 2024-03-08 DIAGNOSIS — M5459 Other low back pain: Secondary | ICD-10-CM | POA: Diagnosis not present

## 2024-03-08 NOTE — Therapy (Signed)
 OUTPATIENT PHYSICAL THERAPY NEURO treatment   Patient Name: Gabriella Marsh MRN: [id number] DOB:December 16, 1966, 58 y.o., female Today's Date: 03/08/2024   PCP: Sherlyn Hay, DO REFERRING PROVIDER: Cannon Kettle, MD  END OF SESSION:  PT End of Session - 03/08/24 1633     Visit Number 8    Number of Visits 25    Date for PT Re-Evaluation 03/22/24    Progress Note Due on Visit 10    PT Start Time 1620    PT Stop Time 1700    PT Time Calculation (min) 40 min    Equipment Utilized During Treatment Gait belt    Activity Tolerance Patient tolerated treatment well    Behavior During Therapy WFL for tasks assessed/performed             Past Medical History:  Diagnosis Date   Arthritis    knees   Blood clotting disorder (HCC)    on toe    Degenerative disc disease, lumbar    Diabetes mellitus without complication (HCC)    GERD (gastroesophageal reflux disease)    Headache    daily - AM (has not been able to have SPG blocks lately)   Hyperlipidemia    Hypertension    Neuropathy    feet   Vertigo    3-4x/yr   Past Surgical History:  Procedure Laterality Date   ABDOMINAL HYSTERECTOMY     But still has cervix   AMPUTATION TOE Right 07/24/2021   Procedure: AMPUTATION TOE;  Surgeon: Linus Galas, DPM;  Location: [facility] ORS;  Service: Podiatry;  Laterality: Right;   CESAREAN SECTION     CHOLECYSTECTOMY     COLONOSCOPY WITH PROPOFOL N/A 02/20/2022   Procedure: COLONOSCOPY WITH PROPOFOL;  Surgeon: Wyline Mood, MD;  Location: [facility] ENDOSCOPY;  Service: Gastroenterology;  Laterality: N/A;   left arm frature  10/12/2020   OVARIAN CYST SURGERY     PARATHYROIDECTOMY  03/28/2021   Procedure: PARATHYROIDECTOMY AUTOTRANSPLANT;  Surgeon: Duanne Guess, MD;  Location: [facility] ORS;  Service: General;;   right femur surgery Right    SHOULDER SURGERY Left 12/26/014   Dr. Elfredia Nevins, [facility]   THYROIDECTOMY N/A 03/28/2021   Procedure: THYROIDECTOMY, total;  Surgeon: Duanne Guess, MD;  Location: [facility] ORS;  Service: General;  Laterality: N/A;  Provider requesting 3 hours /180 minutes for procedure   TONSILLECTOMY AND ADENOIDECTOMY     UTERINE FIBROID SURGERY     Patient Active Problem List   Diagnosis Date Noted   Wheelchair dependence 03/10/2023   Hypertension associated with diabetes (HCC) 03/10/2023   AKI (acute kidney injury) (HCC) 01/07/2023   Neurogenic bladder 01/07/2023   FTT (failure to thrive) in adult 12/09/2022   Primary insomnia 09/10/2022   Impaired functional mobility, balance, gait, and endurance 06/09/2022   Type 2 diabetes mellitus with diabetic neuropathy, with long-term current use of insulin (HCC) 04/24/2022   Hypothyroidism 04/24/2022   Hyperlipidemia associated with type 2 diabetes mellitus (HCC) 04/24/2022   Iron deficiency anemia 04/24/2022   Chronic sensorimotor polyneuropathy with axonal and demyelinating features 01/12/2022   Osteoarthritis of knees (Bilateral) 01/12/2022   Tricompartment osteoarthritis of knees (Bilateral) 01/12/2022    Grade 1 Retrolisthesis (9mm) of L5 over S1 01/12/2022   History of proximal humerus fracture (Left) 01/12/2022   Vitamin D deficiency 01/12/2022   Chronic peripheral neuropathic pain 01/12/2022   Disorder of skeletal system 11/12/2021   Problems influencing health status 11/12/2021   Chronic hand pain (1ry area of Pain) (Bilateral) (  L>R) 11/12/2021   Chronic feet pain (3ry area of Pain) (Bilateral) (L>R) 11/12/2021   Chronic shoulder pain (4th area of Pain) (Left) 11/12/2021   Chronic knee pain (5th area of Pain) (Bilateral) (L>R) 11/12/2021   Lumbar facet syndrome (Bilateral) 11/12/2021   COVID-19 virus infection 07/22/2021   Chronic pain syndrome 07/17/2021   S/P total thyroidectomy 03/28/2021   Thyroid nodule    Schamberg's purpura 10/09/2019   Common migraine with intractable migraine 06/18/2015   DDD (degenerative disc disease), lumbosacral 05/03/2015   Insomnia due to medical  condition 05/03/2015   Disorder of peripheral nervous system 05/03/2015   Diabetic peripheral neuropathy (HCC) 03/06/2015   Obesity 03/06/2015   GERD (gastroesophageal reflux disease) 03/06/2015   Multinodular goiter 03/06/2015   RLS (restless legs syndrome) 03/06/2015   Chronic low back pain (2ry area of Pain) (Bilateral) (L>R) w/o sciatica 03/06/2015    ONSET DATE: 06/18/22  REFERRING DIAG: S72.91XD (ICD-10-CM) - Unspecified fracture of right femur, subsequent encounter for closed fracture with routine healing   THERAPY DIAG:  Muscle weakness (generalized)  Unsteadiness on feet  Difficulty in walking, not elsewhere classified  Other low back pain  Other abnormalities of gait and mobility  Rationale for Evaluation and Treatment: Rehabilitation  SUBJECTIVE:                                                                                                                                                                                             SUBJECTIVE STATEMENT:   Pt reports that she is doing well, but unable to get ankle stabilizer on the R AFO due to schedule conflict. Has been walking at home daily.   Pt accompanied by:  Spouse   PERTINENT HISTORY: Pt is a pleasant 58 y/o female known to PT clinic, returning for impairments due to R femur fx sustained in 2023. Pt hx includes fall in June 2023 where pt suffered R femur fx followed by surgery where pt had plate and screws placed in R knee and rod in R femur. At the time pt with hx of multiple falls, now pt reports no falls in last 6 months. Since previous d/c from PT on 05/13/2023, pt has been consistently going to [facility] clinic 1x/week, and now returns to [facility] for new year. Pt reports she ambulated with RW while at [facility] clinic, has been able to perform STS to RW from higher surfaces. She would like to continue working on her mobility and strength, including both UE and LE strength. She still struggles with putting on her shoes and  socks and getting from her bed to the commode. She reports standing  at [facility] also limited due to chronic low back pain. She reports she'll likely get injections to tx LBP in future. Pt still performing previous HEP given, typically she completes 1 set of each exercise for 25-30 reps. Says this is easy. PMH per chart significant for arthritis, blood clotting disorder, DDD (lumbar), DM, headache, HTN, neuropathy, vertigo   PAIN:  Are you having pain? Yes: NPRS scale: not numerically rated Pain location: low back, bilat hand pain "hurts a lot all the time"  Pain description:   Aggravating factors: standing limited due to LBP in position Relieving factors: wearing gloves helps with hand pain  PRECAUTIONS: Fall  RED FLAGS: None   WEIGHT BEARING RESTRICTIONS: No  FALLS: Has patient fallen in last 6 months? No  LIVING ENVIRONMENT: via chart, pt confirms the following is still correct Lives with: lives with their family and lives with their spouse Lives in: House/apartment Stairs: No Has following equipment at home: Environmental consultant - 2 wheeled, Wheelchair (manual), Shower bench, bed side commode, and Grab bars  PLOF: Needs assistance with ADLs  PATIENT GOALS: Get out from bed to walker to bedside commode with or without LE braces donned, improve strength in UE/LE  OBJECTIVE:  Note: Objective measures were completed at Evaluation unless otherwise noted.  DIAGNOSTIC FINDINGS:   Via chart [facility] 05/13/2023 XR Femur 2 views right: "Impression  Unchanged position and alignment with further osseous bridging at the distal femur fracture following intramedullary rod fixation." And findings "FINDINGS:  Position and alignment of the comminuted distal femur fracture are unchanged after placement of a retrograde interlocking intramedullary rod. Proximal and distal interlocking components articulate appropriately with the rod. There is progressive osseous bridging with remodeling at the fracture site,  there is no new fracture or dislocation. "  COGNITION: Overall cognitive status: Within functional limits for tasks assessed   SENSATION: Reports impaired BLE, does have sensation on tops of thighs per report   COORDINATION: WFL UE rapid alt movement WFL LE with heel to shin, but unable to perform rapid alt movement due to weakness in bilat DFs  EDEMA:  Reports no swelling    POSTURE: rounded shoulders and forward head  LOWER EXTREMITY ROM:     Slight impairment with R ankle DF > L with PROM Slight impairment of L knee extension and somewhat painful    LOWER EXTREMITY MMT:    Grossly 4/5 in BLE exception is pt unable to complete DF bilat, other areas of significant mm deficit include hip flexors, abductors and hamstrings bilat  TRANSFERS:  Attempted STS to RW from WC during eval. Unable to perform from wheelchair even with max assist. Pt reports she has been able to complete STS from higher surfaces and this is how she practiced in [facility]  Assistive device utilized: Walker - 2 wheeled  Sit to stand:  unable from standard chair height/WC see above  FUNCTIONAL TESTS:  Attempted STS from [facility], unable at this time. Will attempt functional test like 30 sec STS in future as pt able  PATIENT SURVEYS:  LEFS 50/80 FOTO 54  TREATMENT DATE: 12/29/2023  Stand pivot transfer to nustep with RW, KAFO, AFO. Min assist from husband.   Nustep level 1 x 3 min BLE/BUE.  x 2 min BLE only, x 2  min BUE/BLE. Tactile cues for hip activation without UE support, but significant improvement in reciprocal pattern pattern and knee extension   Sit<>stand from [facility] throughout session with mod assist overall and PT to stabilize RW. Performed approximately 8 times throughout session  Foot tap on lip of parallel bars x 8 bil with knee unlocked in KAFO.   Forward/reverse gait  with L knee unlocked on KAFO 69ft x 2 in parallel bars. No knee buckling, but increased inversion on the RLE as well as increased fatigue. Cues for proper step length to reduce risk of knee buckling. PT tightened R shoe and reduced ankle instability on second bout.    Gait with RW, AFO and KAFO x 66ft CGA form PT for safety with cues for step width to reduce rolling of the R ankle in AFO and reduce pressure on lateral malleoli.      PATIENT EDUCATION: Education details: assessment findings, goals, plan, HEP. WC planning HEP expansion  Person educated: Patient and Spouse Education method: Explanation, Verbal cues, and Handouts Education comprehension: verbalized understanding and returned demonstration  HOME EXERCISE PROGRAM: Access Code: [email or web address] URL: [email or web address] Date: 12/29/2023 Prepared by: Temple Pacini  Exercises - Seated Hamstring Curls with Resistance  - 1 x daily - 5-6 x weekly - 3-4 sets - 20-25 reps - Seated Hip Abduction with Resistance  - 1 x daily - 5-6 x weekly - 3-4 sets - 20-25 reps - Seated March  - 1 x daily - 5-6 x weekly - 3-4 sets - 20-25 reps   Access Code: [email or web address] URL: [email or web address] Date: 01/26/2024 Prepared by: Grier Rocher  Exercises - Supine Heel Slide  - 1 x daily - 7 x weekly - 3 sets - 10 reps - Supine Straight Leg Raises  - 1 x daily - 7 x weekly - 3 sets - 10 reps - Hooklying Clamshell with Resistance  - 1 x daily - 7 x weekly - 3 sets - 10 reps - Supine Hip Flexion  - 1 x daily - 7 x weekly - 3 sets - 10 reps - Supine Single Leg Ankle Pumps  - 1 x daily - 7 x weekly - 3 sets - 10 reps - Supine Bridge  - 1 x daily - 7 x weekly - 3 sets - 10 reps GOALS: Goals reviewed with patient? Yes  SHORT TERM GOALS: Target date: 02/09/2024   Patient will be independent in home exercise program to improve strength/mobility for better functional independence with ADLs. Baseline: initiated Goal status:  INITIAL    LONG TERM GOALS: Target date: 03/22/2024   Patient will perform sit to stand with min-mod assist from standard chair height in order to indicate improved lower extremity functional strength and improved ability to perform functional transfers and functional leg activities within her home.  Baseline: mod assist with +2 for safety.  Goal status: INITIAL  2.  Pt will improve LEFS by 10 points or more in order to indicate improved LE function and mobility.  Baseline: 50 Goal status: INITIAL  3.  Patient will increase FOTO score to equal to or greater than  59   to demonstrate statistically significant improvement in mobility and quality of life.  Baseline: 54 Goal status: INITIAL  4.  Pt will require no more than min assist for  transfer from bed to bedside commode Baseline: supervision assist with  slide board transfer. To mat table  Goal status: INITIAL  5.  The pt will demo ability to don socks and shoes indep or mod I for increased ease with dressing and improved independence Baseline: required max assist from husband  Goal status: INITIAL  6.  The pt will demo ability to complete 30 second STS test to indicate increased ease with transfers and BLE strength Baseline: 2 sit<>stand from [facility] with cushion in place and  +2 for safety.  Goal status: INITIAL   ASSESSMENT:  CLINICAL IMPRESSION:  Patient is a pleasant 58 y.o. female who was seen today for physical therapy treatment for impairments related to R femur fx sustained in 2023. PT treatment focused on improved activity tolerance to standing and improved gait pattern as well as improved reciprocal movement training on nustep with BUE/BLE and BLE only. Pt noted to be able to stand on the LLE without L knee locked in extension as well as ambulate forward and reverse.  The pt will benefit from further skilled PT to improve these impairments in order to increase ease with ADLs, QOL and to reduce fall risk.   OBJECTIVE  IMPAIRMENTS: Abnormal gait, decreased activity tolerance, decreased balance, decreased coordination, decreased endurance, decreased mobility, difficulty walking, decreased ROM, decreased strength, hypomobility, impaired sensation, improper body mechanics, postural dysfunction, and pain.   ACTIVITY LIMITATIONS: carrying, lifting, bending, standing, squatting, stairs, transfers, bed mobility, bathing, toileting, dressing, and locomotion level  PARTICIPATION LIMITATIONS: meal prep, cleaning, laundry, driving, shopping, community activity, and yard work  PERSONAL FACTORS: Fitness, Sex, Time since onset of injury/illness/exacerbation, and 3+ comorbidities: PMH per chart significant for arthritis, blood clotting disorder, DDD (lumbar), DM, headache, HTN, neuropathy, vertigo  are also affecting patient's functional outcome.   REHAB POTENTIAL: Good  CLINICAL DECISION MAKING: Evolving/moderate complexity  EVALUATION COMPLEXITY: Moderate  PLAN:  PT FREQUENCY: 1-2x/week  PT DURATION: 12 weeks  PLANNED INTERVENTIONS: [id number]- PT Re-evaluation, 97110-Therapeutic exercises, 97530- Therapeutic activity, [id number]- Neuromuscular re-education, 97535- Self Care, 66440- Manual therapy, [id number]- Gait training, [phone]- Orthotic Fit/training, [phone]- Canalith repositioning, [id number]- Electrical stimulation (manual), [id number]- Ultrasound, Patient/Family education, Balance training, Stair training, Taping, Dry Needling, Joint mobilization, Spinal mobilization, Vestibular training, DME instructions, Wheelchair mobility training, Cryotherapy, and Moist heat  PLAN FOR NEXT SESSION:   Continue BLE strengthening. Gait as tolerated with AFO/KAFO  Golden Pop, PT 03/08/2024, 4:34 PM

## 2024-03-09 NOTE — Telephone Encounter (Signed)
 Requested Prescriptions  Pending Prescriptions Disp Refills   OZEMPIC, 0.25 OR 0.5 MG/DOSE, 2 MG/3ML SOPN [Pharmacy Med Name: OZEMPIC 0.25 OR 0.5MG  DOS(2MG /3ML)] 3 mL 0    Sig: INJECT 0.5MG  UNDER THE SKIN ONCE A WEEK     Endocrinology:  Diabetes - GLP-1 Receptor Agonists - semaglutide Failed - 03/09/2024  4:24 PM      Failed - HBA1C in normal range and within 180 days    Hgb A1c MFr Bld  Date Value Ref Range Status  10/11/2023 9.1 (H) 4.8 - 5.6 % Final    Comment:             Prediabetes: 5.7 - 6.4          Diabetes: >6.4          Glycemic control for adults with diabetes: <7.0          Failed - Cr in normal range and within 360 days    Creatinine, Ser  Date Value Ref Range Status  07/06/2023 0.50 (L) 0.57 - 1.00 mg/dL Final   Creatinine,U  Date Value Ref Range Status  06/05/2015 194.5 mg/dL Final         Passed - Valid encounter within last 6 months    Recent Outpatient Visits           1 month ago Follow-up exam, less than 3 months since previous exam   [facility] [place], Sarah N, DO   4 months ago Type 2 diabetes mellitus with diabetic neuropathy, with long-term current use of insulin (HCC)   [facility] [place], Monico Blitz, DO   5 months ago Type 2 diabetes mellitus with hyperglycemia, with long-term current use of insulin [facility])   [place] [facility] Merita Norton T, FNP   8 months ago Type 2 diabetes mellitus with diabetic neuropathy, without long-term current use of insulin [facility])   [place] [facility] Merita Norton T, FNP   1 year ago Type 2 diabetes mellitus with diabetic neuropathy, without long-term current use of insulin [facility])   [place] [facility] Jacky Kindle, FNP       Future Appointments             In 6 months McG[place]an, Elana Alm [facility] [place]

## 2024-03-13 ENCOUNTER — Ambulatory Visit: Payer: BC Managed Care – PPO | Admitting: Physical Therapy

## 2024-03-15 ENCOUNTER — Ambulatory Visit: Payer: BC Managed Care – PPO | Admitting: Physical Therapy

## 2024-03-15 DIAGNOSIS — R262 Difficulty in walking, not elsewhere classified: Secondary | ICD-10-CM | POA: Diagnosis not present

## 2024-03-15 DIAGNOSIS — R2689 Other abnormalities of gait and mobility: Secondary | ICD-10-CM

## 2024-03-15 DIAGNOSIS — M5459 Other low back pain: Secondary | ICD-10-CM

## 2024-03-15 DIAGNOSIS — M6281 Muscle weakness (generalized): Secondary | ICD-10-CM

## 2024-03-15 DIAGNOSIS — R2681 Unsteadiness on feet: Secondary | ICD-10-CM

## 2024-03-15 NOTE — Therapy (Unsigned)
 OUTPATIENT PHYSICAL THERAPY NEURO treatment   Patient Name: Gabriella Marsh MRN: [id number] DOB:[birth year], 58 y.o., female Today's Date: 03/15/2024   PCP: Sherlyn Hay, DO REFERRING PROVIDER: Cannon Kettle, MD  END OF SESSION:  PT End of Session - 03/15/24 1620     Visit Number 9    Number of Visits 25    Date for PT Re-Evaluation 03/22/24    Progress Note Due on Visit 10    PT Start Time 1621    PT Stop Time 1700    PT Time Calculation (min) 39 min    Equipment Utilized During Treatment Gait belt    Activity Tolerance Patient tolerated treatment well    Behavior During Therapy WFL for tasks assessed/performed             Past Medical History:  Diagnosis Date   Arthritis    knees   Blood clotting disorder (HCC)    on toe    Degenerative disc disease, lumbar    Diabetes mellitus without complication (HCC)    GERD (gastroesophageal reflux disease)    Headache    daily - AM (has not been able to have SPG blocks lately)   Hyperlipidemia    Hypertension    Neuropathy    feet   Vertigo    3-4x/yr   Past Surgical History:  Procedure Laterality Date   ABDOMINAL HYSTERECTOMY     But still has cervix   AMPUTATION TOE Right 07/24/2021   Procedure: AMPUTATION TOE;  Surgeon: Linus Galas, DPM;  Location: [facility] ORS;  Service: Podiatry;  Laterality: Right;   CESAREAN SECTION     CHOLECYSTECTOMY     COLONOSCOPY WITH PROPOFOL N/A 02/20/2022   Procedure: COLONOSCOPY WITH PROPOFOL;  Surgeon: Wyline Mood, MD;  Location: [facility] ENDOSCOPY;  Service: Gastroenterology;  Laterality: N/A;   left arm frature  10/12/2020   OVARIAN CYST SURGERY     PARATHYROIDECTOMY  03/28/2021   Procedure: PARATHYROIDECTOMY AUTOTRANSPLANT;  Surgeon: Duanne Guess, MD;  Location: [facility] ORS;  Service: General;;   right femur surgery Right    SHOULDER SURGERY Left 12/26/014   Dr. Elfredia Nevins, [facility]   THYROIDECTOMY N/A 03/28/2021   Procedure: THYROIDECTOMY, total;  Surgeon: Duanne Guess, MD;  Location: [facility] ORS;  Service: General;  Laterality: N/A;  Provider requesting 3 hours /180 minutes for procedure   TONSILLECTOMY AND ADENOIDECTOMY     UTERINE FIBROID SURGERY     Patient Active Problem List   Diagnosis Date Noted   Wheelchair dependence 03/10/2023   Hypertension associated with diabetes (HCC) 03/10/2023   AKI (acute kidney injury) (HCC) 01/07/2023   Neurogenic bladder 01/07/2023   FTT (failure to thrive) in adult 12/09/2022   Primary insomnia 09/10/2022   Impaired functional mobility, balance, gait, and endurance 06/09/2022   Type 2 diabetes mellitus with diabetic neuropathy, with long-term current use of insulin (HCC) 04/24/2022   Hypothyroidism 04/24/2022   Hyperlipidemia associated with type 2 diabetes mellitus (HCC) 04/24/2022   Iron deficiency anemia 04/24/2022   Chronic sensorimotor polyneuropathy with axonal and demyelinating features 01/12/2022   Osteoarthritis of knees (Bilateral) 01/12/2022   Tricompartment osteoarthritis of knees (Bilateral) 01/12/2022    Grade 1 Retrolisthesis (9mm) of L5 over S1 01/12/2022   History of proximal humerus fracture (Left) 01/12/2022   Vitamin D deficiency 01/12/2022   Chronic peripheral neuropathic pain 01/12/2022   Disorder of skeletal system 11/12/2021   Problems influencing health status 11/12/2021   Chronic hand pain (1ry area of Pain) (Bilateral) (  L>R) 11/12/2021   Chronic feet pain (3ry area of Pain) (Bilateral) (L>R) 11/12/2021   Chronic shoulder pain (4th area of Pain) (Left) 11/12/2021   Chronic knee pain (5th area of Pain) (Bilateral) (L>R) 11/12/2021   Lumbar facet syndrome (Bilateral) 11/12/2021   COVID-19 virus infection 07/22/2021   Chronic pain syndrome 07/17/2021   S/P total thyroidectomy 03/28/2021   Thyroid nodule    Schamberg's purpura 10/09/2019   Common migraine with intractable migraine 06/18/2015   DDD (degenerative disc disease), lumbosacral 05/03/2015   Insomnia due to medical  condition 05/03/2015   Disorder of peripheral nervous system 05/03/2015   Diabetic peripheral neuropathy (HCC) 03/06/2015   Obesity 03/06/2015   GERD (gastroesophageal reflux disease) 03/06/2015   Multinodular goiter 03/06/2015   RLS (restless legs syndrome) 03/06/2015   Chronic low back pain (2ry area of Pain) (Bilateral) (L>R) w/o sciatica 03/06/2015    ONSET DATE: 06/18/22  REFERRING DIAG: S72.91XD (ICD-10-CM) - Unspecified fracture of right femur, subsequent encounter for closed fracture with routine healing   THERAPY DIAG:  Muscle weakness (generalized)  Unsteadiness on feet  Difficulty in walking, not elsewhere classified  Other low back pain  Other abnormalities of gait and mobility  Rationale for Evaluation and Treatment: Rehabilitation  SUBJECTIVE:                                                                                                                                                                                             SUBJECTIVE STATEMENT:   Pt reports that she is doing well, is trying to walk more at home, and was able to go to event over the weekend.   Pt accompanied by:  Spouse   PERTINENT HISTORY: Pt is a pleasant 58 y/o female known to PT clinic, returning for impairments due to R femur fx sustained in 2023. Pt hx includes fall in June 2023 where pt suffered R femur fx followed by surgery where pt had plate and screws placed in R knee and rod in R femur. At the time pt with hx of multiple falls, now pt reports no falls in last 6 months. Since previous d/c from PT on 05/13/2023, pt has been consistently going to [facility] clinic 1x/week, and now returns to [facility] for new year. Pt reports she ambulated with RW while at [facility] clinic, has been able to perform STS to RW from higher surfaces. She would like to continue working on her mobility and strength, including both UE and LE strength. She still struggles with putting on her shoes and socks and getting from her  bed to the commode. She reports standing at [facility] also  limited due to chronic low back pain. She reports she'll likely get injections to tx LBP in future. Pt still performing previous HEP given, typically she completes 1 set of each exercise for 25-30 reps. Says this is easy. PMH per chart significant for arthritis, blood clotting disorder, DDD (lumbar), DM, headache, HTN, neuropathy, vertigo   PAIN:  Are you having pain? Yes: NPRS scale: not numerically rated Pain location: low back, bilat hand pain "hurts a lot all the time"  Pain description:   Aggravating factors: standing limited due to LBP in position Relieving factors: wearing gloves helps with hand pain  PRECAUTIONS: Fall  RED FLAGS: None   WEIGHT BEARING RESTRICTIONS: No  FALLS: Has patient fallen in last 6 months? No  LIVING ENVIRONMENT: via chart, pt confirms the following is still correct Lives with: lives with their family and lives with their spouse Lives in: House/apartment Stairs: No Has following equipment at home: Environmental consultant - 2 wheeled, Wheelchair (manual), Shower bench, bed side commode, and Grab bars  PLOF: Needs assistance with ADLs  PATIENT GOALS: Get out from bed to walker to bedside commode with or without LE braces donned, improve strength in UE/LE  OBJECTIVE:  Note: Objective measures were completed at Evaluation unless otherwise noted.  DIAGNOSTIC FINDINGS:   Via chart [facility] 05/13/2023 XR Femur 2 views right: "Impression  Unchanged position and alignment with further osseous bridging at the distal femur fracture following intramedullary rod fixation." And findings "FINDINGS:  Position and alignment of the comminuted distal femur fracture are unchanged after placement of a retrograde interlocking intramedullary rod. Proximal and distal interlocking components articulate appropriately with the rod. There is progressive osseous bridging with remodeling at the fracture site, there is no new fracture or  dislocation. "  COGNITION: Overall cognitive status: Within functional limits for tasks assessed   SENSATION: Reports impaired BLE, does have sensation on tops of thighs per report   COORDINATION: WFL UE rapid alt movement WFL LE with heel to shin, but unable to perform rapid alt movement due to weakness in bilat DFs  EDEMA:  Reports no swelling    POSTURE: rounded shoulders and forward head  LOWER EXTREMITY ROM:     Slight impairment with R ankle DF > L with PROM Slight impairment of L knee extension and somewhat painful    LOWER EXTREMITY MMT:    Grossly 4/5 in BLE exception is pt unable to complete DF bilat, other areas of significant mm deficit include hip flexors, abductors and hamstrings bilat  TRANSFERS:  Attempted STS to RW from WC during eval. Unable to perform from wheelchair even with max assist. Pt reports she has been able to complete STS from higher surfaces and this is how she practiced in [facility]  Assistive device utilized: Walker - 2 wheeled  Sit to stand:  unable from standard chair height/WC see above  FUNCTIONAL TESTS:  Attempted STS from [facility], unable at this time. Will attempt functional test like 30 sec STS in future as pt able  PATIENT SURVEYS:  LEFS 50/80 FOTO 54  TREATMENT DATE: 12/29/2023  Stand pivot transfer to EOM with RW and mod assist initially from [facility].  Blocked practice sit<>stand 3 x 5 from elevated mat at 24 inches, 22 inches then 21 inches. CGA throughout with min cues for proper anterior weight shift.   Side stepping on EOM 42ft bil x 3 with CGA from PT.  Gait with RW 96ft x 3 with WC follow for safety, AFO and KAFO. Pt required 2 standing rest breaks on first bout to bend the L knee with brace unlocked to "relieve pressure"   Seated therex:  HS curl RTB x 12 bil  Hip abduction x 12 bil with RTB  LAQ RTB x  8 on the LLE and x 10 on the RLE.     PATIENT EDUCATION: Education details: assessment findings, goals, plan, HEP. WC planning HEP expansion  Pt educated throughout session about proper posture and technique with exercises. Improved exercise technique, movement at target joints, use of target muscles after min to mod verbal, visual, tactile cues.  Person educated: Patient and Spouse Education method: Explanation, Verbal cues, and Handouts Education comprehension: verbalized understanding and returned demonstration  HOME EXERCISE PROGRAM: Access Code: [email or web address] URL: [email or web address] Date: 12/29/2023 Prepared by: Temple Pacini  Exercises - Seated Hamstring Curls with Resistance  - 1 x daily - 5-6 x weekly - 3-4 sets - 20-25 reps - Seated Hip Abduction with Resistance  - 1 x daily - 5-6 x weekly - 3-4 sets - 20-25 reps - Seated March  - 1 x daily - 5-6 x weekly - 3-4 sets - 20-25 reps   Access Code: [email or web address] URL: [email or web address] Date: 01/26/2024 Prepared by: Grier Rocher  Exercises - Supine Heel Slide  - 1 x daily - 7 x weekly - 3 sets - 10 reps - Supine Straight Leg Raises  - 1 x daily - 7 x weekly - 3 sets - 10 reps - Hooklying Clamshell with Resistance  - 1 x daily - 7 x weekly - 3 sets - 10 reps - Supine Hip Flexion  - 1 x daily - 7 x weekly - 3 sets - 10 reps - Supine Single Leg Ankle Pumps  - 1 x daily - 7 x weekly - 3 sets - 10 reps - Supine Bridge  - 1 x daily - 7 x weekly - 3 sets - 10 reps GOALS: Goals reviewed with patient? Yes  SHORT TERM GOALS: Target date: 02/09/2024   Patient will be independent in home exercise program to improve strength/mobility for better functional independence with ADLs. Baseline: initiated Goal status: INITIAL    LONG TERM GOALS: Target date: 03/22/2024   Patient will perform sit to stand with min-mod assist from standard chair height in order to indicate improved lower extremity functional  strength and improved ability to perform functional transfers and functional leg activities within her home.  Baseline: mod assist with +2 for safety.  Goal status: INITIAL  2.  Pt will improve LEFS by 10 points or more in order to indicate improved LE function and mobility.  Baseline: 50 Goal status: INITIAL  3.  Patient will increase FOTO score to equal to or greater than  59   to demonstrate statistically significant improvement in mobility and quality of life.  Baseline: 54 Goal status: INITIAL  4.  Pt will require no more than min assist for transfer from bed to bedside commode Baseline: supervision assist with  slide board transfer. To mat table  Goal status: INITIAL  5.  The pt will demo ability to don socks and shoes indep or mod I for increased ease with dressing and improved independence Baseline: required max assist from husband  Goal status: INITIAL  6.  The pt will demo ability to complete 30 second STS test to indicate increased ease with transfers and BLE strength Baseline: 2 sit<>stand from [facility] with cushion in place and  +2 for safety.  Goal status: INITIAL   ASSESSMENT:  CLINICAL IMPRESSION:  Patient is a pleasant 58 y.o. female who was seen today for physical therapy treatment for impairments related to R femur fx sustained in 2023. PT treatment focused on functional movement training with sit<>stand from lowering elevated mat table, dynamic and forward gait and BLE strengthening. Tolerated well with mild increased Low back pain with forward gait training with RW.   The pt will benefit from further skilled PT to improve these impairments in order to increase ease with ADLs, QOL and to reduce fall risk.   OBJECTIVE IMPAIRMENTS: Abnormal gait, decreased activity tolerance, decreased balance, decreased coordination, decreased endurance, decreased mobility, difficulty walking, decreased ROM, decreased strength, hypomobility, impaired sensation, improper body mechanics,  postural dysfunction, and pain.   ACTIVITY LIMITATIONS: carrying, lifting, bending, standing, squatting, stairs, transfers, bed mobility, bathing, toileting, dressing, and locomotion level  PARTICIPATION LIMITATIONS: meal prep, cleaning, laundry, driving, shopping, community activity, and yard work  PERSONAL FACTORS: Fitness, Sex, Time since onset of injury/illness/exacerbation, and 3+ comorbidities: PMH per chart significant for arthritis, blood clotting disorder, DDD (lumbar), DM, headache, HTN, neuropathy, vertigo  are also affecting patient's functional outcome.   REHAB POTENTIAL: Good  CLINICAL DECISION MAKING: Evolving/moderate complexity  EVALUATION COMPLEXITY: Moderate  PLAN:  PT FREQUENCY: 1-2x/week  PT DURATION: 12 weeks  PLANNED INTERVENTIONS: 97164- PT Re-evaluation, 97110-Therapeutic exercises, 97530- Therapeutic activity, [id number]- Neuromuscular re-education, 97535- Self Care, [id number]- Manual therapy, [id number]- Gait training, [phone]- Orthotic Fit/training, [phone]- Canalith repositioning, [id number]- Electrical stimulation (manual), [id number]- Ultrasound, Patient/Family education, Balance training, Stair training, Taping, Dry Needling, Joint mobilization, Spinal mobilization, Vestibular training, DME instructions, Wheelchair mobility training, Cryotherapy, and Moist heat  PLAN FOR NEXT SESSION:   Continue BLE strengthening. Gait as tolerated with AFO/KAFO  Golden Pop, PT 03/15/2024, 5:01 PM

## 2024-03-16 ENCOUNTER — Encounter: Payer: Self-pay | Admitting: Family Medicine

## 2024-03-20 ENCOUNTER — Ambulatory Visit: Payer: BC Managed Care – PPO | Admitting: Physical Therapy

## 2024-03-22 ENCOUNTER — Ambulatory Visit: Payer: BC Managed Care – PPO | Attending: Orthopedic Surgery | Admitting: Physical Therapy

## 2024-03-22 ENCOUNTER — Encounter: Payer: Self-pay | Admitting: Family Medicine

## 2024-03-22 DIAGNOSIS — R2689 Other abnormalities of gait and mobility: Secondary | ICD-10-CM | POA: Diagnosis not present

## 2024-03-22 DIAGNOSIS — R262 Difficulty in walking, not elsewhere classified: Secondary | ICD-10-CM | POA: Diagnosis not present

## 2024-03-22 DIAGNOSIS — R2681 Unsteadiness on feet: Secondary | ICD-10-CM | POA: Diagnosis not present

## 2024-03-22 DIAGNOSIS — M6281 Muscle weakness (generalized): Secondary | ICD-10-CM | POA: Diagnosis not present

## 2024-03-22 DIAGNOSIS — M5459 Other low back pain: Secondary | ICD-10-CM | POA: Diagnosis not present

## 2024-03-22 DIAGNOSIS — R600 Localized edema: Secondary | ICD-10-CM

## 2024-03-22 NOTE — Therapy (Unsigned)
 OUTPATIENT PHYSICAL THERAPY NEURO treatment/  PHYSICAL THERAPY PROGRESS NOTE   Dates of reporting period  12/29/23   to   03/22/2024     Patient Name: Gabriella Marsh MRN: [id number] DOB:January 02, 1966, 58 y.o., female Today's Date: 03/22/2024   PCP: Sherlyn Hay, DO REFERRING PROVIDER: Cannon Kettle, MD  END OF SESSION:  PT End of Session - 03/22/24 1619     Visit Number 10    Number of Visits 25    Date for PT Re-Evaluation 03/22/24    Progress Note Due on Visit 10    PT Start Time 1619    PT Stop Time 1700    PT Time Calculation (min) 41 min    Equipment Utilized During Treatment Gait belt    Activity Tolerance Patient tolerated treatment well    Behavior During Therapy WFL for tasks assessed/performed             Past Medical History:  Diagnosis Date   Arthritis    knees   Blood clotting disorder (HCC)    on toe    Degenerative disc disease, lumbar    Diabetes mellitus without complication (HCC)    GERD (gastroesophageal reflux disease)    Headache    daily - AM (has not been able to have SPG blocks lately)   Hyperlipidemia    Hypertension    Neuropathy    feet   Vertigo    3-4x/yr   Past Surgical History:  Procedure Laterality Date   ABDOMINAL HYSTERECTOMY     But still has cervix   AMPUTATION TOE Right 07/24/2021   Procedure: AMPUTATION TOE;  Surgeon: Linus Galas, DPM;  Location: [facility] ORS;  Service: Podiatry;  Laterality: Right;   CESAREAN SECTION     CHOLECYSTECTOMY     COLONOSCOPY WITH PROPOFOL N/A 02/20/2022   Procedure: COLONOSCOPY WITH PROPOFOL;  Surgeon: Wyline Mood, MD;  Location: [facility] ENDOSCOPY;  Service: Gastroenterology;  Laterality: N/A;   left arm frature  10/12/2020   OVARIAN CYST SURGERY     PARATHYROIDECTOMY  03/28/2021   Procedure: PARATHYROIDECTOMY AUTOTRANSPLANT;  Surgeon: Duanne Guess, MD;  Location: [facility] ORS;  Service: General;;   right femur surgery Right    SHOULDER SURGERY Left 12/26/014   Dr. Elfredia Nevins, [facility]    THYROIDECTOMY N/A 03/28/2021   Procedure: THYROIDECTOMY, total;  Surgeon: Duanne Guess, MD;  Location: [facility] ORS;  Service: General;  Laterality: N/A;  Provider requesting 3 hours /180 minutes for procedure   TONSILLECTOMY AND ADENOIDECTOMY     UTERINE FIBROID SURGERY     Patient Active Problem List   Diagnosis Date Noted   Wheelchair dependence 03/10/2023   Hypertension associated with diabetes (HCC) 03/10/2023   AKI (acute kidney injury) (HCC) 01/07/2023   Neurogenic bladder 01/07/2023   FTT (failure to thrive) in adult 12/09/2022   Primary insomnia 09/10/2022   Impaired functional mobility, balance, gait, and endurance 06/09/2022   Type 2 diabetes mellitus with diabetic neuropathy, with long-term current use of insulin (HCC) 04/24/2022   Hypothyroidism 04/24/2022   Hyperlipidemia associated with type 2 diabetes mellitus (HCC) 04/24/2022   Iron deficiency anemia 04/24/2022   Chronic sensorimotor polyneuropathy with axonal and demyelinating features 01/12/2022   Osteoarthritis of knees (Bilateral) 01/12/2022   Tricompartment osteoarthritis of knees (Bilateral) 01/12/2022    Grade 1 Retrolisthesis (9mm) of L5 over S1 01/12/2022   History of proximal humerus fracture (Left) 01/12/2022   Vitamin D deficiency 01/12/2022   Chronic peripheral neuropathic pain 01/12/2022   Disorder  of skeletal system 11/12/2021   Problems influencing health status 11/12/2021   Chronic hand pain (1ry area of Pain) (Bilateral) (L>R) 11/12/2021   Chronic feet pain (3ry area of Pain) (Bilateral) (L>R) 11/12/2021   Chronic shoulder pain (4th area of Pain) (Left) 11/12/2021   Chronic knee pain (5th area of Pain) (Bilateral) (L>R) 11/12/2021   Lumbar facet syndrome (Bilateral) 11/12/2021   COVID-19 virus infection 07/22/2021   Chronic pain syndrome 07/17/2021   S/P total thyroidectomy 03/28/2021   Thyroid nodule    Schamberg's purpura 10/09/2019   Common migraine with intractable migraine 06/18/2015    DDD (degenerative disc disease), lumbosacral 05/03/2015   Insomnia due to medical condition 05/03/2015   Disorder of peripheral nervous system 05/03/2015   Diabetic peripheral neuropathy (HCC) 03/06/2015   Obesity 03/06/2015   GERD (gastroesophageal reflux disease) 03/06/2015   Multinodular goiter 03/06/2015   RLS (restless legs syndrome) 03/06/2015   Chronic low back pain (2ry area of Pain) (Bilateral) (L>R) w/o sciatica 03/06/2015    ONSET DATE: 06/18/22  REFERRING DIAG: S72.91XD (ICD-10-CM) - Unspecified fracture of right femur, subsequent encounter for closed fracture with routine healing   THERAPY DIAG:  Muscle weakness (generalized)  Other low back pain  Difficulty in walking, not elsewhere classified  Unsteadiness on feet  Other abnormalities of gait and mobility  Rationale for Evaluation and Treatment: Rehabilitation  SUBJECTIVE:                                                                                                                                                                                             SUBJECTIVE STATEMENT:   Pt reports that she is doing well. States that she has been up and down more this week, using BSC more frequently,but has increased swelling in BLE.     Pt accompanied by:  Spouse   PERTINENT HISTORY: Pt is a pleasant 58 y/o female known to PT clinic, returning for impairments due to R femur fx sustained in 2023. Pt hx includes fall in June 2023 where pt suffered R femur fx followed by surgery where pt had plate and screws placed in R knee and rod in R femur. At the time pt with hx of multiple falls, now pt reports no falls in last 6 months. Since previous d/c from PT on 05/13/2023, pt has been consistently going to [facility] clinic 1x/week, and now returns to [facility] for new year. Pt reports she ambulated with RW while at [facility] clinic, has been able to perform STS to RW from higher surfaces. She would like to continue working on her mobility and  strength, including both UE and  LE strength. She still struggles with putting on her shoes and socks and getting from her bed to the commode. She reports standing at [facility] also limited due to chronic low back pain. She reports she'll likely get injections to tx LBP in future. Pt still performing previous HEP given, typically she completes 1 set of each exercise for 25-30 reps. Says this is easy. PMH per chart significant for arthritis, blood clotting disorder, DDD (lumbar), DM, headache, HTN, neuropathy, vertigo   PAIN:  Are you having pain? Yes: NPRS scale: not numerically rated Pain location: low back, bilat hand pain "hurts a lot all the time"  Pain description:   Aggravating factors: standing limited due to LBP in position Relieving factors: wearing gloves helps with hand pain  PRECAUTIONS: Fall  RED FLAGS: None   WEIGHT BEARING RESTRICTIONS: No  FALLS: Has patient fallen in last 6 months? No  LIVING ENVIRONMENT: via chart, pt confirms the following is still correct Lives with: lives with their family and lives with their spouse Lives in: House/apartment Stairs: No Has following equipment at home: Environmental consultant - 2 wheeled, Wheelchair (manual), Shower bench, bed side commode, and Grab bars  PLOF: Needs assistance with ADLs  PATIENT GOALS: Get out from bed to walker to bedside commode with or without LE braces donned, improve strength in UE/LE  OBJECTIVE:  Note: Objective measures were completed at Evaluation unless otherwise noted.  DIAGNOSTIC FINDINGS:   Via chart [facility] 05/13/2023 XR Femur 2 views right: "Impression  Unchanged position and alignment with further osseous bridging at the distal femur fracture following intramedullary rod fixation." And findings "FINDINGS:  Position and alignment of the comminuted distal femur fracture are unchanged after placement of a retrograde interlocking intramedullary rod. Proximal and distal interlocking components articulate  appropriately with the rod. There is progressive osseous bridging with remodeling at the fracture site, there is no new fracture or dislocation. "  COGNITION: Overall cognitive status: Within functional limits for tasks assessed   SENSATION: Reports impaired BLE, does have sensation on tops of thighs per report   COORDINATION: WFL UE rapid alt movement WFL LE with heel to shin, but unable to perform rapid alt movement due to weakness in bilat DFs  EDEMA:  Reports no swelling    POSTURE: rounded shoulders and forward head  LOWER EXTREMITY ROM:     Slight impairment with R ankle DF > L with PROM Slight impairment of L knee extension and somewhat painful    LOWER EXTREMITY MMT:    Grossly 4/5 in BLE exception is pt unable to complete DF bilat, other areas of significant mm deficit include hip flexors, abductors and hamstrings bilat  TRANSFERS:  Attempted STS to RW from WC during eval. Unable to perform from wheelchair even with max assist. Pt reports she has been able to complete STS from higher surfaces and this is how she practiced in [facility]  Assistive device utilized: Walker - 2 wheeled  Sit to stand:  unable from standard chair height/WC see above  FUNCTIONAL TESTS:  Attempted STS from [facility], unable at this time. Will attempt functional test like 30 sec STS in future as pt able  PATIENT SURVEYS:  LEFS 50/80 FOTO 54  TREATMENT DATE: 12/29/2023  PT assessed BP: Sitting 157/82(105) 71. 30 sec Sit<>stand performed x 3 with mod assist on this day with assist to stabilize RW and for lift off chair.   Gait training with RW 38ft, 37ft and 58ft. With CGA/min assist for safety and husband following with RW. Assist from PT for unlocking KAFO on the LLE.    PATIENT EDUCATION: Education details: assessment findings, goals, plan, HEP. WC planning HEP  expansion  Pt educated throughout session about proper posture and technique with exercises. Improved exercise technique, movement at target joints, use of target muscles after min to mod verbal, visual, tactile cues.  Person educated: Patient and Spouse Education method: Explanation, Verbal cues, and Handouts Education comprehension: verbalized understanding and returned demonstration  HOME EXERCISE PROGRAM: Access Code: [email or web address] URL: [email or web address] Date: 12/29/2023 Prepared by: Temple Pacini  Exercises - Seated Hamstring Curls with Resistance  - 1 x daily - 5-6 x weekly - 3-4 sets - 20-25 reps - Seated Hip Abduction with Resistance  - 1 x daily - 5-6 x weekly - 3-4 sets - 20-25 reps - Seated March  - 1 x daily - 5-6 x weekly - 3-4 sets - 20-25 reps   Access Code: [email or web address] URL: [email or web address] Date: 01/26/2024 Prepared by: Grier Rocher  Exercises - Supine Heel Slide  - 1 x daily - 7 x weekly - 3 sets - 10 reps - Supine Straight Leg Raises  - 1 x daily - 7 x weekly - 3 sets - 10 reps - Hooklying Clamshell with Resistance  - 1 x daily - 7 x weekly - 3 sets - 10 reps - Supine Hip Flexion  - 1 x daily - 7 x weekly - 3 sets - 10 reps - Supine Single Leg Ankle Pumps  - 1 x daily - 7 x weekly - 3 sets - 10 reps - Supine Bridge  - 1 x daily - 7 x weekly - 3 sets - 10 reps GOALS: Goals reviewed with patient? Yes  SHORT TERM GOALS: Target date: 02/09/2024   Patient will be independent in home exercise program to improve strength/mobility for better functional independence with ADLs. Baseline: initiated Goal status: INITIAL    LONG TERM GOALS: Target date: 03/22/2024   Patient will perform sit to stand with min-mod assist from standard chair height in order to indicate improved lower extremity functional strength and improved ability to perform functional transfers and functional leg activities within her home.  Baseline: mod assist with +2  for safety.  4/2: mod assist  Goal status: INITIAL  2.  Pt will improve LEFS by 10 points or more in order to indicate improved LE function and mobility.  Baseline: 50 4/2: 32 Goal status: INITIAL  3.  Patient will increase FOTO score to equal to or greater than  59   to demonstrate statistically significant improvement in mobility and quality of life.  Baseline: 54 Goal status: goal d/c'ed.   4.  Pt will require no more than min assist for transfer from bed to bedside commode Baseline: supervision assist with  slide board transfer. To mat table  Using walker for transfer to [facility] with mod assist for safety and  Goal status: progressing   5.  The pt will demo ability to don socks and shoes indep or mod I for increased ease with dressing and improved independence Baseline: required max assist from husband  4/2: pt states that she is donning night socks. Is still requiring max assist for LLE  and moderate assist for the RLE  Goal status: Progressing  6.  The pt will demo ability to complete 30 second STS test to indicate increased ease with transfers and BLE strength Baseline: 2 sit<>stand from [facility] with cushion in place and  +2 for safety.  4/2: 3 with mod assist and +2 to stabilize RW  Goal status: progressing    ASSESSMENT:  CLINICAL IMPRESSION:  Patient is a pleasant 58 y.o. female who was seen today for physical therapy treatment for impairments related to R femur fx sustained in 2023 and progress not on this day. Pt demonstrates improved distance with gait using AFO, RW and KAFO to >47ft at one time. Improved reported ability to perform stand pivot transfer to and from [facility] as well as increased time/tolerance in standing for ADLs at home. Pt was noted to have decreased score on LEFS, but reports that she is moving better RW over WC at this point.   The pt will benefit from further skilled PT to improve these impairments in order to increase ease with ADLs, QOL and to reduce fall risk.    OBJECTIVE IMPAIRMENTS: Abnormal gait, decreased activity tolerance, decreased balance, decreased coordination, decreased endurance, decreased mobility, difficulty walking, decreased ROM, decreased strength, hypomobility, impaired sensation, improper body mechanics, postural dysfunction, and pain.   ACTIVITY LIMITATIONS: carrying, lifting, bending, standing, squatting, stairs, transfers, bed mobility, bathing, toileting, dressing, and locomotion level  PARTICIPATION LIMITATIONS: meal prep, cleaning, laundry, driving, shopping, community activity, and yard work  PERSONAL FACTORS: Fitness, Sex, Time since onset of injury/illness/exacerbation, and 3+ comorbidities: PMH per chart significant for arthritis, blood clotting disorder, DDD (lumbar), DM, headache, HTN, neuropathy, vertigo  are also affecting patient's functional outcome.   REHAB POTENTIAL: Good  CLINICAL DECISION MAKING: Evolving/moderate complexity  EVALUATION COMPLEXITY: Moderate  PLAN:  PT FREQUENCY: 1-2x/week  PT DURATION: 12 weeks  PLANNED INTERVENTIONS: [id number]- PT Re-evaluation, 97110-Therapeutic exercises, 97530- Therapeutic activity, [id number]- Neuromuscular re-education, 97535- Self Care, [id number]- Manual therapy, [id number]- Gait training, [phone]- Orthotic Fit/training, [phone]- Canalith repositioning, [id number]- Electrical stimulation (manual), [id number]- Ultrasound, Patient/Family education, Balance training, Stair training, Taping, Dry Needling, Joint mobilization, Spinal mobilization, Vestibular training, DME instructions, Wheelchair mobility training, Cryotherapy, and Moist heat  PLAN FOR NEXT SESSION:   Continue BLE strengthening. Gait as tolerated with AFO/KAFO  Golden Pop, PT 03/22/2024, 4:20 PM

## 2024-03-26 MED ORDER — FUROSEMIDE 20 MG PO TABS: 20.0000 mg | ORAL_TABLET | Freq: Every day | ORAL | 0 refills | Status: DC

## 2024-03-27 ENCOUNTER — Ambulatory Visit: Payer: BC Managed Care – PPO | Admitting: Physical Therapy

## 2024-03-27 ENCOUNTER — Other Ambulatory Visit: Payer: Self-pay | Admitting: Family Medicine

## 2024-03-27 DIAGNOSIS — R600 Localized edema: Secondary | ICD-10-CM

## 2024-03-28 ENCOUNTER — Encounter: Payer: Self-pay | Admitting: Student in an Organized Health Care Education/Training Program

## 2024-03-28 ENCOUNTER — Ambulatory Visit
Payer: BC Managed Care – PPO | Attending: Student in an Organized Health Care Education/Training Program | Admitting: Student in an Organized Health Care Education/Training Program

## 2024-03-28 VITALS — BP 119/82 | HR 75 | Temp 97.8°F | Resp 16 | Ht 70.0 in | Wt 228.0 lb

## 2024-03-28 DIAGNOSIS — M79672 Pain in left foot: Secondary | ICD-10-CM | POA: Diagnosis not present

## 2024-03-28 DIAGNOSIS — G894 Chronic pain syndrome: Secondary | ICD-10-CM

## 2024-03-28 DIAGNOSIS — M545 Low back pain, unspecified: Secondary | ICD-10-CM | POA: Insufficient documentation

## 2024-03-28 DIAGNOSIS — Z79891 Long term (current) use of opiate analgesic: Secondary | ICD-10-CM

## 2024-03-28 DIAGNOSIS — Z79899 Other long term (current) drug therapy: Secondary | ICD-10-CM | POA: Diagnosis not present

## 2024-03-28 DIAGNOSIS — M25562 Pain in left knee: Secondary | ICD-10-CM | POA: Diagnosis not present

## 2024-03-28 DIAGNOSIS — G8929 Other chronic pain: Secondary | ICD-10-CM | POA: Diagnosis not present

## 2024-03-28 DIAGNOSIS — M4317 Spondylolisthesis, lumbosacral region: Secondary | ICD-10-CM

## 2024-03-28 DIAGNOSIS — M79671 Pain in right foot: Secondary | ICD-10-CM | POA: Insufficient documentation

## 2024-03-28 DIAGNOSIS — M25561 Pain in right knee: Secondary | ICD-10-CM | POA: Diagnosis not present

## 2024-03-28 DIAGNOSIS — M79641 Pain in right hand: Secondary | ICD-10-CM

## 2024-03-28 DIAGNOSIS — M25512 Pain in left shoulder: Secondary | ICD-10-CM | POA: Diagnosis not present

## 2024-03-28 DIAGNOSIS — M4310 Spondylolisthesis, site unspecified: Secondary | ICD-10-CM | POA: Diagnosis not present

## 2024-03-28 DIAGNOSIS — M170 Bilateral primary osteoarthritis of knee: Secondary | ICD-10-CM

## 2024-03-28 DIAGNOSIS — M79642 Pain in left hand: Secondary | ICD-10-CM | POA: Diagnosis not present

## 2024-03-28 MED ORDER — OXYCODONE-ACETAMINOPHEN 5-325 MG PO TABS: 1.0000 | ORAL_TABLET | Freq: Three times a day (TID) | ORAL | 0 refills | Status: DC

## 2024-03-28 MED ORDER — GABAPENTIN 800 MG PO TABS
800.0000 mg | ORAL_TABLET | Freq: Three times a day (TID) | ORAL | 2 refills | Status: DC
Start: 2024-03-28 — End: 2024-06-21

## 2024-03-28 NOTE — Progress Notes (Unsigned)
 PROVIDER NOTE: Interpretation of information contained herein should be left to medically-trained personnel. Specific patient instructions are provided elsewhere under "Patient Instructions" section of medical record. This document was created in part using AI and STT-dictation technology, any transcriptional errors that may result from this process are unintentional.  Patient: Gabriella Marsh  Service: E/M   PCP: Sherlyn Hay, DO  DOB: [birth year]  DOS: 03/28/2024  Provider: Bettey Costa, NP  MRN: [id number]  Delivery: Face-to-face  Specialty: Interventional Pain Management  Type: Established Patient  Setting: Ambulatory outpatient facility  Specialty designation: 09  Referring Prov.: Sherlyn Hay, DO  Location: Outpatient office facility       HPI  [name], a 58 y.o. year old female, is here today because of her No primary diagnosis found.. [name] primary complain today is Hand Pain (Bilateral ) and Knee Pain (Right )  Pain Assessment: Severity of Chronic pain is reported as a 6 /10. Location: Hand (knee right) Left, Right/denies. Onset: More than a month ago. Quality: Burning, Discomfort, Cramping, Tingling, Constant, Other (Comment), Sore (sensitive). Timing: Constant. Modifying factor(s): medications. Vitals:  height is 5\' 10"  (1.778 m) and weight is 228 lb (103.4 kg). Her temporal temperature is 97.8 F (36.6 C). Her blood pressure is 119/82 and her pulse is 75. Her respiration is 16 and oxygen saturation is 98%.  BMI: Estimated body mass index is 32.71 kg/m as calculated from the following:   Height as of this encounter: 5\' 10"  (1.778 m).   Weight as of this encounter: 228 lb (103.4 kg). Last encounter: 01/06/2024. Last procedure: 08/02/2023.  Reason for encounter: medication management.  The patient indicates doing well with the current medication regimen. No adverse reaction or side effects reported to the medication. She is currently awaiting insurance  approval for Qutenza for further neuropathic  pain management options. Pharmacotherapy Assessment  Analgesic: Oxycodone-acetaminophen (Percocet) 5-32/3255 MG tablet every 8 hours as needed for pain. MME=20 Gabapentin (Neurontin) 800 mg 3 times daily  Monitoring: [place] PMP: PDMP reviewed during this encounter.       Pharmacotherapy: No side-effects or adverse reactions reported. Compliance: No problems identified. Effectiveness: Clinically acceptable.  Vernie Ammons, RN  03/28/2024  3:12 PM  Sign when Signing Visit Nursing Pain Medication Assessment:  Safety precautions to be maintained throughout the outpatient stay will include: orient to surroundings, keep bed in low position, maintain call bell within reach at all times, provide assistance with transfer out of bed and ambulation.  Medication Inspection Compliance: Pill count conducted under aseptic conditions, in front of the patient. Neither the pills nor the bottle was removed from the patient's sight at any time. Once count was completed pills were immediately returned to the patient in their original bottle.  Medication: Oxycodone IR Pill/Patch Count:  74 of 90 pills remain Pill/Patch Appearance: Markings consistent with prescribed medication Bottle Appearance: Standard pharmacy container. Clearly labeled. Filled Date: 03 / 30 / 2025 Last Medication intake:  Today    No results found for: "CBDTHCR" No results found for: "D8THCCBX" No results found for: "D9THCCBX"  UDS:  Summary  Date Value Ref Range Status  07/06/2023 Note  Final    Comment:    ==================================================================== [employer] Select 13 (MW) ==================================================================== Test                             Result       Flag  Units  Drug Present and Declared for Prescription Verification   Oxycodone                      614          EXPECTED   ng/mg creat   Oxymorphone                     223          EXPECTED   ng/mg creat   Noroxycodone                   1181         EXPECTED   ng/mg creat   Noroxymorphone                 273          EXPECTED   ng/mg creat    Sources of oxycodone are scheduled prescription medications.    Oxymorphone, noroxycodone, and noroxymorphone are expected    metabolites of oxycodone. Oxymorphone is also available as a    scheduled prescription medication.  ==================================================================== Test                      Result    Flag   Units      Ref Range   Creatinine              244              mg/dL      >=16 ==================================================================== Declared Medications:  The flagging and interpretation on this report are based on the  following declared medications.  Unexpected results may arise from  inaccuracies in the declared medications.   **Note: The testing scope of this panel includes these medications:   Oxycodone (Percocet)   **Note: The testing scope of this panel does not include the  following reported medications:   Acetaminophen (Tylenol)  Acetaminophen (Percocet)  Atorvastatin (Lipitor)  Clopidogrel (Plavix)  Cyclobenzaprine (Flexeril)  Fluticasone (Flonase)  Gabapentin  Insulin (Lantus)  Levothyroxine (Synthroid)  Lisinopril (Zestril)  Melatonin  Metformin  Nortriptyline (Pamelor)  Omeprazole (Prilosec)  Semaglutide  Trazodone (Desyrel) ==================================================================== For clinical consultation, please call [phone]. ====================================================================       ROS  Constitutional: Denies any fever or chills Gastrointestinal: No reported hemesis, hematochezia, vomiting, or acute GI distress Musculoskeletal: Denies any acute onset joint swelling, redness, loss of ROM, or weakness Neurological: Numbness Upper and lower bilateral extremities, Sensitive to touch    Medication Review  Insulin Pen Needle, Melatonin, Semaglutide(0.25 or 0.5MG /DOS), acetaminophen, atorvastatin, cyclobenzaprine, fluticasone, furosemide, gabapentin, insulin aspart, insulin glargine, levothyroxine, lisinopril, metFORMIN, nortriptyline, nystatin-triamcinolone ointment, omeprazole, oxyCODONE-acetaminophen, and traZODone  History Review  Allergy: [name] is allergic to celecoxib, pregabalin, buspar [buspirone], citalopram, and other. Drug: [name]  reports no history of drug use. Alcohol:  reports no history of alcohol use. Tobacco:  reports that she quit smoking about 12 years ago. Her smoking use included cigarettes. She has never used smokeless tobacco. Social: Ms. Vigilante  reports that she quit smoking about 12 years ago. Her smoking use included cigarettes. She has never used smokeless tobacco. She reports that she does not drink alcohol and does not use drugs. Medical:  has a past medical history of Arthritis, Blood clotting disorder (HCC), Degenerative disc disease, lumbar, Diabetes mellitus without complication (HCC), GERD (gastroesophageal reflux disease), Headache, Hyperlipidemia, Hypertension, Neuropathy, and Vertigo. Surgical: [name]  has a past surgical  history that includes Cesarean section; Shoulder surgery (Left, 12/26/014); Abdominal hysterectomy; Tonsillectomy and adenoidectomy; Uterine fibroid surgery; Ovarian cyst surgery; Cholecystectomy; left arm frature (10/12/2020); Thyroidectomy (N/A, 03/28/2021); Parathyroidectomy (03/28/2021); Amputation toe (Right, 07/24/2021); Colonoscopy with propofol (N/A, 02/20/2022); and right femur surgery (Right). Family: family history includes Congestive Heart Failure in her father; Diabetes in her father; Healthy in her brother; Heart disease in her father; Hyperlipidemia in her father; Hypertension in her father; Irritable bowel syndrome in her mother; Osteoporosis in her maternal grandmother and mother.  Laboratory  Chemistry Profile   Renal Lab Results  Component Value Date   BUN 16 07/06/2023   CREATININE 0.50 (L) 07/06/2023   BCR 32 (H) 07/06/2023   GFR 111.18 06/05/2015   GFRAA 92 01/03/2021   GFRNONAA >60 01/09/2023    Hepatic Lab Results  Component Value Date   AST 17 07/06/2023   ALT 23 07/06/2023   ALBUMIN 4.1 07/06/2023   ALKPHOS 72 07/06/2023   LIPASE 47 05/04/2015    Electrolytes Lab Results  Component Value Date   NA 135 07/06/2023   K 4.4 07/06/2023   CL 96 07/06/2023   CALCIUM 8.8 07/06/2023   MG 1.8 01/09/2023    Bone Lab Results  Component Value Date   25OHVITD1 14 (L) 11/12/2021   25OHVITD2 <1.0 11/12/2021   25OHVITD3 13 11/12/2021    Inflammation (CRP: Acute Phase) (ESR: Chronic Phase) Lab Results  Component Value Date   CRP 4 11/12/2021   ESRSEDRATE 39 11/12/2021   LATICACIDVEN 2.9 (HH) 01/07/2023         Note: Above Lab results reviewed.  Recent Imaging Review  US Venous Img Lower Bilateral CLINICAL DATA:  pain  EXAM: BILATERAL LOWER EXTREMITY VENOUS DOPPLER ULTRASOUND  TECHNIQUE: Gray-scale sonography with graded compression, as well as color Doppler and duplex ultrasound were performed to evaluate the lower extremity deep venous systems from the level of the common femoral vein and including the common femoral, femoral, profunda femoral, popliteal and calf veins including the posterior tibial, peroneal and gastrocnemius veins when visible. The superficial great saphenous vein was also interrogated. Spectral Doppler was utilized to evaluate flow at rest and with distal augmentation maneuvers in the common femoral, femoral and popliteal veins.  COMPARISON:  LEFT lower extremity venous duplex, 05/27/2022.  FINDINGS: RIGHT LOWER EXTREMITY  VENOUS  Normal compressibility of the RIGHT common femoral, superficial femoral, and popliteal veins, as well as the visualized calf veins. Visualized portions of profunda femoral vein and great  saphenous vein unremarkable. No filling defects to suggest DVT on grayscale or color Doppler imaging. Doppler waveforms show normal direction of venous flow, normal respiratory plasticity and response to augmentation.  OTHER  No evidence of superficial thrombophlebitis or abnormal fluid collection.  Limitations: Patient body habitus  LEFT LOWER EXTREMITY  VENOUS  Normal compressibility of the LEFT common femoral, superficial femoral, and popliteal veins, as well as the visualized calf veins. Visualized portions of profunda femoral vein and great saphenous vein unremarkable. No filling defects to suggest DVT on grayscale or color Doppler imaging. Doppler waveforms show normal direction of venous flow, normal respiratory plasticity and response to augmentation.  OTHER  No evidence of superficial thrombophlebitis or abnormal fluid collection.  Limitations: Patient body habitus  IMPRESSION: No evidence of femoropopliteal DVT or superficial thrombophlebitis within either lower extremity.  Roanna Banning, MD  [facility]  [facility]  Electronically Signed   By: Roanna Banning M.D.   On: 01/07/2023 12:23 DG Chest [facility]  1 View CLINICAL DATA:  Hypotension.  EXAM: PORTABLE CHEST 1 VIEW  COMPARISON:  07/22/2021  FINDINGS: The lungs are clear without focal pneumonia, edema, pneumothorax or pleural effusion. The cardiopericardial silhouette is within normal limits for size. The visualized bony structures of the thorax are unremarkable. Telemetry leads overlie the chest.  IMPRESSION: No active disease.  Electronically Signed   By: Kennith Center M.D.   On: 01/07/2023 11:21 Note: Reviewed        Physical Exam  General appearance: alert, cooperative, oriented, in no distress, and Well nourished, well developed, and well hydrated. In no apparent acute distress Mental status: Alert, oriented x 3 (person, place, & time)        Respiratory: No evidence of acute respiratory distress Eyes: PERLA Vitals: BP 119/82 (BP Location: Right Arm, Patient Position: Sitting, Cuff Size: Normal)   Pulse 75   Temp 97.8 F (36.6 C) (Temporal)   Resp 16   Ht 5\' 10"  (1.778 m)   Wt 228 lb (103.4 kg)   SpO2 98%   BMI 32.71 kg/m  BMI: Estimated body mass index is 32.71 kg/m as calculated from the following:   Height as of this encounter: 5\' 10"  (1.778 m).   Weight as of this encounter: 228 lb (103.4 kg). Ideal: Ideal body weight: 68.5 kg (151 lb 0.2 oz) Adjusted ideal body weight: 82.5 kg (181 lb 12.9 oz)  Assessment   Diagnosis Status  1. Chronic pain syndrome   2. Osteoarthritis of knees (Bilateral)   3. Pharmacologic therapy   4. Chronic shoulder pain (4th area of Pain) (Left)   5.  Grade 1 Retrolisthesis (9mm) of L5 over S1   6. Chronic hand pain (1ry area of Pain) (Bilateral) (L>R)   7. Encounter for medication management   8. Encounter for chronic pain management   9. Chronic use of opiate for therapeutic purpose   10. Chronic knee pain (5th area of Pain) (Bilateral) (L>R)   11. Tricompartment osteoarthritis of knees (Bilateral)   12. Chronic low back pain (2ry area of Pain) (Bilateral) (L>R) w/o sciatica   13. Chronic feet pain (3ry area of Pain) (Bilateral) (L>R)    Controlled Controlled Controlled    Plan of Care  Problem-specific:  Assessment and Plan The patient indicates doing well with the current medication regimen no side effects reported to the medication or any adverse reaction that reported by patient. Qutenza was offered at the previous visit and again during this encounter for management of neuropathic pain; the patient is currently awaiting insurance approval, as she is in the process of transitioning to Medicare.  [name] has a current medication list which includes the following long-term medication(s): atorvastatin, fluticasone, furosemide, insulin aspart, lantus solostar,  insulin pen needle, levothyroxine, lisinopril, metformin, nortriptyline, omeprazole, trazodone, gabapentin, [START ON 05/18/2024] oxycodone-acetaminophen, [START ON 06/17/2024] oxycodone-acetaminophen, and [START ON 04/18/2024] oxycodone-acetaminophen.  Pharmacotherapy (Medications Ordered): Meds ordered this encounter  Medications   DISCONTD: oxyCODONE-acetaminophen (PERCOCET) 5-325 MG tablet    Sig: Take 1 tablet by mouth every 8 (eight) hours. Must last 30 days.    Dispense:  90 tablet    Refill:  0    DO NOT: delete (not duplicate); no partial-fill (will deny script to complete), no refill request (F/U required). DISPENSE: 1 day early if closed on fill date. WARN: No CNS-depressants within 8 hrs of med.   DISCONTD: oxyCODONE-acetaminophen (PERCOCET) 5-325 MG tablet    Sig: Take 1 tablet by mouth every 8 (eight) hours.  Must last 30 days.    Dispense:  90 tablet    Refill:  0    DO NOT: delete (not duplicate); no partial-fill (will deny script to complete), no refill request (F/U required). DISPENSE: 1 day early if closed on fill date. WARN: No CNS-depressants within 8 hrs of med.   DISCONTD: oxyCODONE-acetaminophen (PERCOCET) 5-325 MG tablet    Sig: Take 1 tablet by mouth every 8 (eight) hours. Must last 30 days.    Dispense:  90 tablet    Refill:  0    DO NOT: delete (not duplicate); no partial-fill (will deny script to complete), no refill request (F/U required). DISPENSE: 1 day early if closed on fill date. WARN: No CNS-depressants within 8 hrs of med.   gabapentin (NEURONTIN) 800 MG tablet    Sig: Take 1 tablet (800 mg total) by mouth 3 (three) times daily.    Dispense:  90 tablet    Refill:  2   oxyCODONE-acetaminophen (PERCOCET) 5-325 MG tablet    Sig: Take 1 tablet by mouth every 8 (eight) hours. Must last 30 days.    Dispense:  90 tablet    Refill:  0    DO NOT: delete (not duplicate); no partial-fill (will deny script to complete), no refill request (F/U required). DISPENSE: 1  day early if closed on fill date. WARN: No CNS-depressants within 8 hrs of med.   oxyCODONE-acetaminophen (PERCOCET) 5-325 MG tablet    Sig: Take 1 tablet by mouth every 8 (eight) hours. Must last 30 days.    Dispense:  90 tablet    Refill:  0    DO NOT: delete (not duplicate); no partial-fill (will deny script to complete), no refill request (F/U required). DISPENSE: 1 day early if closed on fill date. WARN: No CNS-depressants within 8 hrs of med.   oxyCODONE-acetaminophen (PERCOCET) 5-325 MG tablet    Sig: Take 1 tablet by mouth every 8 (eight) hours. Must last 30 days.    Dispense:  90 tablet    Refill:  0    DO NOT: delete (not duplicate); no partial-fill (will deny script to complete), no refill request (F/U required). DISPENSE: 1 day early if closed on fill date. WARN: No CNS-depressants within 8 hrs of med.   Orders:  No orders of the defined types were placed in this encounter.  Follow-up plan:   Return in about 3 months (around 06/27/2024) for (F2F), (MM) Randalyn Rhea NP.    Recent Visits Date Type Provider Dept  01/06/24 Office Visit Edward Jolly, MD [facility]  Showing recent visits within past 90 days and meeting all other requirements Today's Visits Date Type Provider Dept  03/28/24 Office Visit Edward Jolly, MD [facility]  Showing today's visits and meeting all other requirements Future Appointments Date Type Provider Dept  06/21/24 Appointment Bettey Costa, NP [facility]  Showing future appointments within next 90 days and meeting all other requirements  I discussed the assessment and treatment plan with the patient. The patient was provided an opportunity to ask questions and all were answered. The patient agreed with the plan and demonstrated an understanding of the instructions.  Patient advised to call back or seek an in-person evaluation if the symptoms or condition worsens.  Duration of encounter: 30 minutes.  Total time on  encounter, as per AMA guidelines included both the face-to-face and non-face-to-face time personally spent by the physician and/or other qualified health care professional(s) on the day of the  encounter (includes time in activities that require the physician or other qualified health care professional and does not include time in activities normally performed by clinical staff). Physician's time may include the following activities when performed: Preparing to see the patient (e.g., pre-charting review of records, searching for previously ordered imaging, lab work, and nerve conduction tests) Review of prior analgesic pharmacotherapies. Reviewing PMP Interpreting ordered tests (e.g., lab work, imaging, nerve conduction tests) Performing post-procedure evaluations, including interpretation of diagnostic procedures Obtaining and/or reviewing separately obtained history Performing a medically appropriate examination and/or evaluation Counseling and educating the patient/family/caregiver Ordering medications, tests, or procedures Referring and communicating with other health care professionals (when not separately reported) Documenting clinical information in the electronic or other health record Independently interpreting results (not separately reported) and communicating results to the patient/ family/caregiver Care coordination (not separately reported)  Note by: Bettey Costa, NP (TTS and AI technology used. I apologize for any typographical errors that were not detected and corrected.) Date: 03/28/2024; Time: 4:35 PM

## 2024-03-28 NOTE — Telephone Encounter (Signed)
 Requested medication (s) are due for refill today: no  Requested medication (s) are on the active medication list: yes  Last refill:  03/26/24 #3 tabs  Future visit scheduled: no  Notes to clinic:  end date 03/29/24 pt written for only 3 days of med- sending back rather than refusing   Requested Prescriptions  Pending Prescriptions Disp Refills   furosemide (LASIX) 20 MG tablet [Pharmacy Med Name: FUROSEMIDE 20MG  TABLETS] 3 tablet 0    Sig: TAKE 1 TABLET(20 MG) BY MOUTH DAILY FOR 3 DAYS     Cardiovascular:  Diuretics - Loop Failed - 03/28/2024 10:39 AM      Failed - K in normal range and within 180 days    Potassium  Date Value Ref Range Status  07/06/2023 4.4 3.5 - 5.2 mmol/L Final         Failed - Ca in normal range and within 180 days    Calcium  Date Value Ref Range Status  07/06/2023 8.8 8.7 - 10.2 mg/dL Final         Failed - Na in normal range and within 180 days    Sodium  Date Value Ref Range Status  07/06/2023 135 134 - 144 mmol/L Final         Failed - Cr in normal range and within 180 days    Creatinine, Ser  Date Value Ref Range Status  07/06/2023 0.50 (L) 0.57 - 1.00 mg/dL Final   Creatinine,U  Date Value Ref Range Status  06/05/2015 194.5 mg/dL Final         Failed - Cl in normal range and within 180 days    Chloride  Date Value Ref Range Status  07/06/2023 96 96 - 106 mmol/L Final         Failed - Mg Level in normal range and within 180 days    Magnesium  Date Value Ref Range Status  01/09/2023 1.8 1.7 - 2.4 mg/dL Final    Comment:    Performed at [facility], [address] [place], [place] [address]         Failed - Valid encounter within last 6 months    Recent Outpatient Visits   None     Future Appointments             In 5 months McGowan, Elana [facility] [address]            Passed - Last BP in normal range    BP Readings from Last 1 Encounters:  02/24/24 110/75

## 2024-03-28 NOTE — Progress Notes (Unsigned)
 Nursing Pain Medication Assessment:  Safety precautions to be maintained throughout the outpatient stay will include: orient to surroundings, keep bed in low position, maintain call bell within reach at all times, provide assistance with transfer out of bed and ambulation.  Medication Inspection Compliance: Pill count conducted under aseptic conditions, in front of the patient. Neither the pills nor the bottle was removed from the patient's sight at any time. Once count was completed pills were immediately returned to the patient in their original bottle.  Medication: Oxycodone IR Pill/Patch Count:  74 of 90 pills remain Pill/Patch Appearance: Markings consistent with prescribed medication Bottle Appearance: Standard pharmacy container. Clearly labeled. Filled Date: 03 / 30 / 2025 Last Medication intake:  Today

## 2024-03-29 ENCOUNTER — Ambulatory Visit: Payer: BC Managed Care – PPO | Admitting: Physical Therapy

## 2024-04-03 ENCOUNTER — Telehealth: Payer: Self-pay | Admitting: Nurse Practitioner

## 2024-04-03 ENCOUNTER — Ambulatory Visit: Payer: BC Managed Care – PPO | Admitting: Physical Therapy

## 2024-04-03 NOTE — Telephone Encounter (Signed)
 Pharmacy has a couple questions for the patient , such as treatment plan. Please give pharmacy a call. TY [phone]  ref no [id number]

## 2024-04-03 NOTE — Telephone Encounter (Signed)
 [facility] has questions because the Oxycodone therapy is new to that pharmacy. I see that patient has been taking Oxycodone for a while, but getting it filled at [facility]. I attempted to call her to ask if she intended for it to be filled at [facility], no answer, message left.

## 2024-04-04 ENCOUNTER — Telehealth: Payer: Self-pay | Admitting: Nurse Practitioner

## 2024-04-04 ENCOUNTER — Emergency Department

## 2024-04-04 ENCOUNTER — Telehealth: Payer: Self-pay

## 2024-04-04 ENCOUNTER — Other Ambulatory Visit: Payer: Self-pay

## 2024-04-04 ENCOUNTER — Emergency Department
Admission: EM | Admit: 2024-04-04 | Discharge: 2024-04-04 | Disposition: A | Discharge status: Home / Self Care | Attending: Emergency Medicine | Admitting: Emergency Medicine

## 2024-04-04 ENCOUNTER — Encounter: Payer: Self-pay | Admitting: Family Medicine

## 2024-04-04 DIAGNOSIS — R160 Hepatomegaly, not elsewhere classified: Secondary | ICD-10-CM | POA: Diagnosis not present

## 2024-04-04 DIAGNOSIS — E1140 Type 2 diabetes mellitus with diabetic neuropathy, unspecified: Secondary | ICD-10-CM | POA: Insufficient documentation

## 2024-04-04 DIAGNOSIS — Z794 Long term (current) use of insulin: Secondary | ICD-10-CM | POA: Insufficient documentation

## 2024-04-04 DIAGNOSIS — R600 Localized edema: Secondary | ICD-10-CM | POA: Insufficient documentation

## 2024-04-04 DIAGNOSIS — R739 Hyperglycemia, unspecified: Secondary | ICD-10-CM | POA: Diagnosis not present

## 2024-04-04 DIAGNOSIS — R1031 Right lower quadrant pain: Secondary | ICD-10-CM | POA: Diagnosis not present

## 2024-04-04 DIAGNOSIS — I10 Essential (primary) hypertension: Secondary | ICD-10-CM | POA: Insufficient documentation

## 2024-04-04 DIAGNOSIS — M79604 Pain in right leg: Secondary | ICD-10-CM | POA: Diagnosis not present

## 2024-04-04 DIAGNOSIS — E1165 Type 2 diabetes mellitus with hyperglycemia: Secondary | ICD-10-CM | POA: Diagnosis not present

## 2024-04-04 DIAGNOSIS — N3289 Other specified disorders of bladder: Secondary | ICD-10-CM | POA: Diagnosis not present

## 2024-04-04 LAB — CBC
HCT: 39.1 % (ref 36.0–46.0)
Hemoglobin: 12.8 g/dL (ref 12.0–15.0)
MCH: 30 pg (ref 26.0–34.0)
MCHC: 32.7 g/dL (ref 30.0–36.0)
MCV: 91.6 fL (ref 80.0–100.0)
Platelets: 305 10*3/uL (ref 150–400)
RBC: 4.27 MIL/uL (ref 3.87–5.11)
RDW: 13.1 % (ref 11.5–15.5)
WBC: 7.7 10*3/uL (ref 4.0–10.5)
nRBC: 0 % (ref 0.0–0.2)

## 2024-04-04 LAB — COMPREHENSIVE METABOLIC PANEL WITH GFR
ALT: 22 U/L (ref 0–44)
AST: 23 U/L (ref 15–41)
Albumin: 3.5 g/dL (ref 3.5–5.0)
Alkaline Phosphatase: 57 U/L (ref 38–126)
Anion gap: 10 (ref 5–15)
BUN: 14 mg/dL (ref 6–20)
CO2: 28 mmol/L (ref 22–32)
Calcium: 8.2 mg/dL — ABNORMAL LOW (ref 8.9–10.3)
Chloride: 96 mmol/L — ABNORMAL LOW (ref 98–111)
Creatinine, Ser: 0.54 mg/dL (ref 0.44–1.00)
GFR, Estimated: >60 mL/min (ref >60)
Glucose, Bld: 307 mg/dL — ABNORMAL HIGH (ref 70–99)
Potassium: 5 mmol/L (ref 3.5–5.1)
Sodium: 134 mmol/L — ABNORMAL LOW (ref 135–145)
Total Bilirubin: 1.2 mg/dL (ref 0.0–1.2)
Total Protein: 7.1 g/dL (ref 6.5–8.1)

## 2024-04-04 LAB — CBG MONITORING, ED: Glucose-Capillary: 309 mg/dL — ABNORMAL HIGH (ref 70–99)

## 2024-04-04 LAB — URINALYSIS, ROUTINE W REFLEX MICROSCOPIC
Bacteria, UA: NONE SEEN
Bilirubin Urine: NEGATIVE
Glucose, UA: >=500 mg/dL — AB
Hgb urine dipstick: NEGATIVE
Ketones, ur: NEGATIVE mg/dL
Leukocytes,Ua: NEGATIVE
Nitrite: NEGATIVE
Protein, ur: NEGATIVE mg/dL
Specific Gravity, Urine: 1.008 (ref 1.005–1.030)
pH: 7 (ref 5.0–8.0)

## 2024-04-04 LAB — LIPASE, BLOOD: Lipase: 29 U/L (ref 11–51)

## 2024-04-04 MED ORDER — SODIUM CHLORIDE 0.9 % IV BOLUS
1000.0000 mL | Freq: Once | INTRAVENOUS | Status: AC
  Administered 2024-04-04: 1000 mL via INTRAVENOUS

## 2024-04-04 MED ORDER — IOHEXOL 300 MG/ML  SOLN
100.0000 mL | Freq: Once | INTRAMUSCULAR | Status: AC | PRN
  Administered 2024-04-04: 100 mL via INTRAVENOUS

## 2024-04-04 NOTE — ED Provider Notes (Signed)
 [facility] Provider Note    Event Date/Time   First MD Initiated Contact with Patient 04/04/24 1235     (approximate)   History   Chief Complaint: Hyperglycemia   HPI  Gabriella Marsh is a 58 y.o. female with a history of diabetes, GERD, hypertension who comes to the ED complaining of elevated blood sugar for the past few days, as high as 400 at home.  She has been compliant with all her medications, usual diet.  Denies focal pain but does have increased swelling and bilateral lower extremities, right greater than left        Past Medical History:  Diagnosis Date   Arthritis    knees   Blood clotting disorder (HCC)    on toe    Degenerative disc disease, lumbar    Diabetes mellitus without complication (HCC)    GERD (gastroesophageal reflux disease)    Headache    daily - AM (has not been able to have SPG blocks lately)   Hyperlipidemia    Hypertension    Neuropathy    feet   Vertigo    3-4x/yr    Current Outpatient Rx   Order #: [id number] Class: Historical Med   Order #: [id number] Class: Normal   Order #: [id number] Class: Normal   Order #: [id number] Class: Normal   Order #: [id number] Class: Normal   Order #: [id number] Class: Normal   Order #: [id number] Class: Normal   Order #: [id number] Class: Normal   Order #: [id number] Class: Normal   Order #: [id number] Class: Normal   Order #: [id number] Class: Normal   Order #: [id number] Class: Historical Med   Order #: [id number] Class: Normal   Order #: [id number] Class: Normal   Order #: [id number] Class: Normal   Order #: [id number] Class: Normal   [START ON 05/18/2024] Order #: [id number] Class: Normal   [START ON 06/17/2024] Order #: [id number] Class: Normal   [START ON 04/18/2024] Order #: [id number] Class: Normal   Order #: [id number] Class: Normal   Order #: [id number] Class: Normal    Past Surgical History:  Procedure Laterality Date   ABDOMINAL HYSTERECTOMY     But still has cervix   AMPUTATION TOE  Right 07/24/2021   Procedure: AMPUTATION TOE;  Surgeon: Linus Galas, DPM;  Location: [facility] ORS;  Service: Podiatry;  Laterality: Right;   CESAREAN SECTION     CHOLECYSTECTOMY     COLONOSCOPY WITH PROPOFOL N/A 02/20/2022   Procedure: COLONOSCOPY WITH PROPOFOL;  Surgeon: Wyline Mood, MD;  Location: [facility] ENDOSCOPY;  Service: Gastroenterology;  Laterality: N/A;   left arm frature  10/12/2020   OVARIAN CYST SURGERY     PARATHYROIDECTOMY  03/28/2021   Procedure: PARATHYROIDECTOMY AUTOTRANSPLANT;  Surgeon: Duanne Guess, MD;  Location: [facility] ORS;  Service: General;;   right femur surgery Right    SHOULDER SURGERY Left 12/26/014   Dr. Elfredia Nevins, [facility]   THYROIDECTOMY N/A 03/28/2021   Procedure: THYROIDECTOMY, total;  Surgeon: Duanne Guess, MD;  Location: [facility] ORS;  Service: General;  Laterality: N/A;  Provider requesting 3 hours /180 minutes for procedure   TONSILLECTOMY AND ADENOIDECTOMY     UTERINE FIBROID SURGERY      Physical Exam   Triage Vital Signs: ED Triage Vitals  Encounter Vitals Group     BP 04/04/24 1222 (!) 152/87     Systolic BP Percentile --      Diastolic BP Percentile --      Pulse Rate 04/04/24 1222 74     Resp 04/04/24 1222 17     Temp 04/04/24 1222 98.4  F (36.9 C)     Temp Source 04/04/24 1222 Oral     SpO2 04/04/24 1222 97 %     Weight 04/04/24 1223 227 lb 15.3 oz (103.4 kg)     Height 04/04/24 1223 5\' 10"  (1.778 m)     Head Circumference --      Peak Flow --      Pain Score 04/04/24 1223 5     Pain Loc --      Pain Education --      Exclude from Growth Chart --     Most recent vital signs: Vitals:   04/04/24 1222  BP: (!) 152/87  Pulse: 74  Resp: 17  Temp: 98.4 F (36.9 C)  SpO2: 97%    General: Awake, no distress.  CV:  Good peripheral perfusion.  Regular rate rhythm Resp:  Normal effort.  Clear to auscultation laterally Abd:  No distention.  Soft with right upper quadrant and right lower quadrant tenderness Other:  Trace pitting edema  bilateral lower extremities.  Right calf circumference is enlarged compared to left.  No focal inflammatory changes, no signs of cellulitis or abscess   ED Results / Procedures / Treatments   Labs (all labs ordered are listed, but only abnormal results are displayed) Labs Reviewed  COMPREHENSIVE METABOLIC PANEL WITH GFR - Abnormal; Notable for the following components:      Result Value   Sodium 134 (*)    Chloride 96 (*)    Glucose, Bld 307 (*)    Calcium 8.2 (*)    All other components within normal limits  CBG MONITORING, ED - Abnormal; Notable for the following components:   Glucose-Capillary 309 (*)    All other components within normal limits  CBC  LIPASE, BLOOD  URINALYSIS, ROUTINE W REFLEX MICROSCOPIC     EKG    RADIOLOGY Ultrasound right lower extremity interpreted by me, no signs of DVT.  Radiology report reviewed   PROCEDURES:  Procedures   MEDICATIONS ORDERED IN ED: Medications  sodium chloride 0.9 % bolus 1,000 mL (1,000 mLs Intravenous New Bag/Given 04/04/24 1321)     IMPRESSION / MDM / ASSESSMENT AND PLAN / ED COURSE  I reviewed the triage vital signs and the nursing notes.  DDx: Appendicitis, pancreatitis, pancreatic mass, diverticulitis, electrolyte derangement, metabolic acidosis, AKI, hyperglycemia, right leg DVT, UTI  Patient's presentation is most consistent with acute presentation with potential threat to life or bodily function.  Patient presents with right leg swelling as well as right side abdominal tenderness.  Vital signs unremarkable, initial labs are reassuring.  Will obtain imaging to further evaluate abdominal tenderness as well as right leg edema.  ----------------------------------------- 3:09 PM on 04/04/2024 ----------------------------------------- Ultrasound right lower extremity is negative for DVT.  Awaiting CT abdomen pelvis.       FINAL CLINICAL IMPRESSION(S) / ED DIAGNOSES   Final diagnoses:  Type 2 diabetes  mellitus with hyperglycemia, with long-term current use of insulin (HCC)  Peripheral edema     Rx / DC Orders   ED Discharge Orders     None        Note:  This document was prepared using Dragon voice recognition software and may include unintentional dictation errors.   Sharman Cheek, MD 04/04/24 [phone]

## 2024-04-04 NOTE — Telephone Encounter (Signed)
 [facility] called re her prescriptions. She needs to speak directly to the provider, Seema, to do the clinical health and safety review. Please call [phone]  the reference number is [id number] She stated this is the third call they have made and will only hold the scripts until tomorrow at 2pm

## 2024-04-04 NOTE — ED Provider Notes (Signed)
 Patient received in signout from Dr. Vicenta Graft pending CT abdomen/pelvis due to suprapubic and RLQ tenderness on exam.  Atraumatic lower extremity swelling.  No signs of DVT.  CT without acute features, though enlarged bladder is noted.  She does have a history of urinary retention requiring Foley catheters in the past.  She voids to me obtain a PVR of only 13 mL.  I reassessed the patient, discussed lymphedema, possible etiologies of the swelling of her lower extremities considering her wheelchair-bound status.  We discussed compliance to her diabetic medications and she is suitable for outpatient management.   Arline Bennett, MD 04/04/24 2005

## 2024-04-04 NOTE — Discharge Instructions (Signed)
 Your ultrasound of the right leg does not show any blood clots.

## 2024-04-04 NOTE — Telephone Encounter (Signed)
 PT called back stated that she discuss last week that the [facility]et goes to [facility] and [facility]. PT stated that if it's more questions please give her a call. TY

## 2024-04-04 NOTE — ED Triage Notes (Signed)
 Pt here with hyperglycemia and swollen feet, worse on the right. Pt states her blood sugars have been in the 300's. Pt states her sugars normally run in the 200's. Pt having urinary frequency, pt is wheelchair bound but able to get to [facility]. Pt also has a headache.

## 2024-04-05 ENCOUNTER — Ambulatory Visit: Payer: BC Managed Care – PPO | Admitting: Physical Therapy

## 2024-04-05 DIAGNOSIS — R262 Difficulty in walking, not elsewhere classified: Secondary | ICD-10-CM

## 2024-04-05 DIAGNOSIS — R2681 Unsteadiness on feet: Secondary | ICD-10-CM | POA: Diagnosis not present

## 2024-04-05 DIAGNOSIS — M5459 Other low back pain: Secondary | ICD-10-CM | POA: Diagnosis not present

## 2024-04-05 DIAGNOSIS — R2689 Other abnormalities of gait and mobility: Secondary | ICD-10-CM

## 2024-04-05 DIAGNOSIS — M6281 Muscle weakness (generalized): Secondary | ICD-10-CM

## 2024-04-05 NOTE — Therapy (Addendum)
 OUTPATIENT PHYSICAL THERAPY NEURO treatment/  Re-cert      Patient Name: Gabriella Marsh MRN: [id number] DOB:1966/07/13, 58 y.o., female Today's Date: 04/05/2024   PCP: Carlean Charter, DO REFERRING PROVIDER: Seena Dadds, MD  END OF SESSION:  PT End of Session - 04/05/24 1620     Visit Number 11    Number of Visits 25    Date for PT Re-Evaluation 05/31/2024   Progress Note Due on Visit 10    PT Start Time 1615    PT Stop Time 1700    PT Time Calculation (min) 45 min    Equipment Utilized During Treatment Gait belt    Activity Tolerance Patient tolerated treatment well    Behavior During Therapy WFL for tasks assessed/performed             Past Medical History:  Diagnosis Date   Arthritis    knees   Blood clotting disorder (HCC)    on toe    Degenerative disc disease, lumbar    Diabetes mellitus without complication (HCC)    GERD (gastroesophageal reflux disease)    Headache    daily - AM (has not been able to have SPG blocks lately)   Hyperlipidemia    Hypertension    Neuropathy    feet   Vertigo    3-4x/yr   Past Surgical History:  Procedure Laterality Date   ABDOMINAL HYSTERECTOMY     But still has cervix   AMPUTATION TOE Right 07/24/2021   Procedure: AMPUTATION TOE;  Surgeon: Angel Barba, DPM;  Location: [facility] ORS;  Service: Podiatry;  Laterality: Right;   CESAREAN SECTION     CHOLECYSTECTOMY     COLONOSCOPY WITH PROPOFOL  N/A 02/20/2022   Procedure: COLONOSCOPY WITH PROPOFOL ;  Surgeon: Luke Salaam, MD;  Location: [facility] ENDOSCOPY;  Service: Gastroenterology;  Laterality: N/A;   left arm frature  10/12/2020   OVARIAN CYST SURGERY     PARATHYROIDECTOMY  03/28/2021   Procedure: PARATHYROIDECTOMY AUTOTRANSPLANT;  Surgeon: Mercy Stall, MD;  Location: [facility] ORS;  Service: General;;   right femur surgery Right    SHOULDER SURGERY Left 12/26/014   Dr. Waymond Hailey, [facility]   THYROIDECTOMY N/A 03/28/2021   Procedure: THYROIDECTOMY, total;   Surgeon: Mercy Stall, MD;  Location: [facility] ORS;  Service: General;  Laterality: N/A;  Provider requesting 3 hours /180 minutes for procedure   TONSILLECTOMY AND ADENOIDECTOMY     UTERINE FIBROID SURGERY     Patient Active Problem List   Diagnosis Date Noted   Wheelchair dependence 03/10/2023   Hypertension associated with diabetes (HCC) 03/10/2023   AKI (acute kidney injury) (HCC) 01/07/2023   Neurogenic bladder 01/07/2023   FTT (failure to thrive) in adult 12/09/2022   Primary insomnia 09/10/2022   Impaired functional mobility, balance, gait, and endurance 06/09/2022   Type 2 diabetes mellitus with diabetic neuropathy, with long-term current use of insulin  (HCC) 04/24/2022   Hypothyroidism 04/24/2022   Hyperlipidemia associated with type 2 diabetes mellitus (HCC) 04/24/2022   Iron deficiency anemia 04/24/2022   Chronic sensorimotor polyneuropathy with axonal and demyelinating features 01/12/2022   Osteoarthritis of knees (Bilateral) 01/12/2022   Tricompartment osteoarthritis of knees (Bilateral) 01/12/2022    Grade 1 Retrolisthesis (9mm) of L5 over S1 01/12/2022   History of proximal humerus fracture (Left) 01/12/2022   Vitamin D  deficiency 01/12/2022   Chronic peripheral neuropathic pain 01/12/2022   Disorder of skeletal system 11/12/2021   Problems influencing health status 11/12/2021   Chronic hand pain (1ry  area of Pain) (Bilateral) (L>R) 11/12/2021   Chronic feet pain (3ry area of Pain) (Bilateral) (L>R) 11/12/2021   Chronic shoulder pain (4th area of Pain) (Left) 11/12/2021   Chronic knee pain (5th area of Pain) (Bilateral) (L>R) 11/12/2021   Lumbar facet syndrome (Bilateral) 11/12/2021   COVID-19 virus infection 07/22/2021   Chronic pain syndrome 07/17/2021   S/P total thyroidectomy 03/28/2021   Thyroid  nodule    Schamberg's purpura 10/09/2019   Common migraine with intractable migraine 06/18/2015   DDD (degenerative disc disease), lumbosacral 05/03/2015   Insomnia  due to medical condition 05/03/2015   Disorder of peripheral nervous system 05/03/2015   Diabetic peripheral neuropathy (HCC) 03/06/2015   Obesity 03/06/2015   GERD (gastroesophageal reflux disease) 03/06/2015   Multinodular goiter 03/06/2015   RLS (restless legs syndrome) 03/06/2015   Chronic low back pain (2ry area of Pain) (Bilateral) (L>R) w/o sciatica 03/06/2015    ONSET DATE: 06/18/22  REFERRING DIAG: S72.91XD (ICD-10-CM) - Unspecified fracture of right femur, subsequent encounter for closed fracture with routine healing   THERAPY DIAG:  Muscle weakness (generalized)  Other abnormalities of gait and mobility  Other low back pain  Difficulty in walking, not elsewhere classified  Unsteadiness on feet  Rationale for Evaluation and Treatment: Rehabilitation  SUBJECTIVE:                                                                                                                                                                                             SUBJECTIVE STATEMENT:   Pt reports that she "has been better." Went to ED due to continued RLE swelling yesterday. Noted  Elevated Blood Glucose. No DVT, non-remarkable CT scan for abdominal pathology. Feels a little tired today.  Increased swelling in BLE, R>L, limits ability for pt to wear AFOs on this day.   Pt accompanied by:  Spouse   PERTINENT HISTORY: Pt is a pleasant 58 y/o female known to PT clinic, returning for impairments due to R femur fx sustained in 2023. Pt hx includes fall in June 2023 where pt suffered R femur fx followed by surgery where pt had plate and screws placed in R knee and rod in R femur. At the time pt with hx of multiple falls, now pt reports no falls in last 6 months. Since previous d/c from PT on 05/13/2023, pt has been consistently going to [facility] clinic 1x/week, and now returns to [facility] for new year. Pt reports she ambulated with RW while at [facility] clinic, has been able to perform STS to RW from  higher surfaces. She would like to continue working on her mobility and strength,  including both UE and LE strength. She still struggles with putting on her shoes and socks and getting from her bed to the commode. She reports standing at [facility] also limited due to chronic low back pain. She reports she'll likely get injections to tx LBP in future. Pt still performing previous HEP given, typically she completes 1 set of each exercise for 25-30 reps. Says this is easy. PMH per chart significant for arthritis, blood clotting disorder, DDD (lumbar), DM, headache, HTN, neuropathy, vertigo   PAIN:  Are you having pain? Yes: NPRS scale: not numerically rated Pain location: low back, bilat hand pain "hurts a lot all the time"  Pain description:   Aggravating factors: standing limited due to LBP in position Relieving factors: wearing gloves helps with hand pain  PRECAUTIONS: Fall  RED FLAGS: None   WEIGHT BEARING RESTRICTIONS: No  FALLS: Has patient fallen in last 6 months? No  LIVING ENVIRONMENT: via chart, pt confirms the following is still correct Lives with: lives with their family and lives with their spouse Lives in: House/apartment Stairs: No Has following equipment at home: Environmental consultant - 2 wheeled, Wheelchair (manual), Shower bench, bed side commode, and Grab bars  PLOF: Needs assistance with ADLs  PATIENT GOALS: Get out from bed to walker to bedside commode with or without LE braces donned, improve strength in UE/LE  OBJECTIVE:  Note: Objective measures were completed at Evaluation unless otherwise noted.  DIAGNOSTIC FINDINGS:   Via chart [facility] care 05/13/2023 XR Femur 2 views right: "Impression  Unchanged position and alignment with further osseous bridging at the distal femur fracture following intramedullary rod fixation." And findings "FINDINGS:  Position and alignment of the comminuted distal femur fracture are unchanged after placement of a retrograde interlocking  intramedullary rod. Proximal and distal interlocking components articulate appropriately with the rod. There is progressive osseous bridging with remodeling at the fracture site, there is no new fracture or dislocation. "  COGNITION: Overall cognitive status: Within functional limits for tasks assessed   SENSATION: Reports impaired BLE, does have sensation on tops of thighs per report   COORDINATION: WFL UE rapid alt movement WFL LE with heel to shin, but unable to perform rapid alt movement due to weakness in bilat DFs  EDEMA:  Reports no swelling    POSTURE: rounded shoulders and forward head  LOWER EXTREMITY ROM:     Slight impairment with R ankle DF > L with PROM Slight impairment of L knee extension and somewhat painful    LOWER EXTREMITY MMT:    Grossly 4/5 in BLE exception is pt unable to complete DF bilat, other areas of significant mm deficit include hip flexors, abductors and hamstrings bilat  TRANSFERS:  Attempted STS to RW from WC during eval. Unable to perform from wheelchair even with max assist. Pt reports she has been able to complete STS from higher surfaces and this is how she practiced in [facility]  Assistive device utilized: Walker - 2 wheeled  Sit to stand:  unable from standard chair height/WC see above  FUNCTIONAL TESTS:  Attempted STS from [facility], unable at this time. Will attempt functional test like 30 sec STS in future as pt able  PATIENT SURVEYS:  LEFS 50/80 FOTO 54  TREATMENT DATE: 12/29/2023  Sit<>stand from [facility] throughout session with min assist and no AFOs.  Stand pivot transfer to nustep with CGA once in standing with RW but no AFO Nustep BLE activity tolerance, AAROM, and strengthening x 8 min with cues for improved attention to knee position to reduce excessive ER.  Gait with RW but no AFO x 75ft to RW. Noted to  have foot drop, but no knee instability on BLE.   Seated ankle DF with AAROM  3 x 10  Standing minisquat with RW x 8.  Lateral step with single LE x 6 bil  Wide semitandem 2 x 15 sec bil with light BLE support on RW.   Education on possible lymphedema causation of RLE pitting edema. Encouraged to talk to PCP to determine if lymphedema treatment may be appropriate.      PATIENT EDUCATION: Education details: assessment findings, goals, plan, HEP. WC planning HEP expansion  Pt educated throughout session about proper posture and technique with exercises. Improved exercise technique, movement at target joints, use of target muscles after min to mod verbal, visual, tactile cues.  Person educated: Patient and Spouse Education method: Explanation, Verbal cues, and Handouts Education comprehension: verbalized understanding and returned demonstration  HOME EXERCISE PROGRAM: Access Code: [email or web address] URL: [email or web address] Date: 12/29/2023 Prepared by: Aminta Kales  Exercises - Seated Hamstring Curls with Resistance  - 1 x daily - 5-6 x weekly - 3-4 sets - 20-25 reps - Seated Hip Abduction with Resistance  - 1 x daily - 5-6 x weekly - 3-4 sets - 20-25 reps - Seated March  - 1 x daily - 5-6 x weekly - 3-4 sets - 20-25 reps   Access Code: [email or web address] URL: [email or web address] Date: 01/26/2024 Prepared by: Aurora Lees  Exercises - Supine Heel Slide  - 1 x daily - 7 x weekly - 3 sets - 10 reps - Supine Straight Leg Raises  - 1 x daily - 7 x weekly - 3 sets - 10 reps - Hooklying Clamshell with Resistance  - 1 x daily - 7 x weekly - 3 sets - 10 reps - Supine Hip Flexion  - 1 x daily - 7 x weekly - 3 sets - 10 reps - Supine Single Leg Ankle Pumps  - 1 x daily - 7 x weekly - 3 sets - 10 reps - Supine Bridge  - 1 x daily - 7 x weekly - 3 sets - 10 reps GOALS: Goals reviewed with patient? Yes  SHORT TERM GOALS: Target date: 02/09/2024   Patient will be  independent in home exercise program to improve strength/mobility for better functional independence with ADLs. Baseline: initiated Goal status: MET     LONG TERM GOALS: Target date: 05/31/2024   Patient will perform sit to stand with min-mod assist from standard chair height in order to indicate improved lower extremity functional strength and improved ability to perform functional transfers and functional leg activities within her home.  Baseline: mod assist with +2 for safety.  4/2: mod assist  Goal status: INITIAL  2.  Pt will improve LEFS by 10 points or more in order to indicate improved LE function and mobility.  Baseline: 50 4/2: 32 Goal status: INITIAL  3.  Patient will increase FOTO score to equal to or greater than  59   to demonstrate statistically significant improvement in mobility and quality of life.  Baseline: 54 Goal status: goal d/c'ed.   4.  Pt will require no more than min assist for transfer  from bed to bedside commode Baseline: supervision assist with  slide board transfer. To mat table  Using walker for transfer to [facility] with mod assist for safety and  Goal status: progressing   5.  The pt will demo ability to don socks and shoes indep or mod I for increased ease with dressing and improved independence Baseline: required max assist from husband  4/2: pt states that she is donning night socks. Is still requiring max assist for LLE and moderate assist for the RLE  Goal status: Progressing  6.  The pt will demo ability to complete 30 second STS test to indicate increased ease with transfers and BLE strength Baseline: 2 sit<>stand from [facility] with cushion in place and  +2 for safety.  4/2: 3 with mod assist and +2 to stabilize RW  Goal status: progressing    ASSESSMENT:  CLINICAL IMPRESSION:  Patient is a pleasant 58 y.o. female who was seen today for physical therapy treatment for impairments related to R femur fx sustained in 2023. PT treatment focused on  improved activity tolerance to improve safety and independence  with functional movement and mobility. Able to progress to CGA-min assist for transfers and gait with RW only and no AFOs on this day. Was noted to have active DF in BLE in partial range. Pt reports that she has increased independence with toilet transfers, shower transfers, and mobility in the house over the last 3 weeks, despite severe RLE edema and increased Blood glucose. The pt will benefit from further skilled PT to improve these impairments in order to increase ease with ADLs, QOL and to reduce fall risk.   OBJECTIVE IMPAIRMENTS: Abnormal gait, decreased activity tolerance, decreased balance, decreased coordination, decreased endurance, decreased mobility, difficulty walking, decreased ROM, decreased strength, hypomobility, impaired sensation, improper body mechanics, postural dysfunction, and pain.   ACTIVITY LIMITATIONS: carrying, lifting, bending, standing, squatting, stairs, transfers, bed mobility, bathing, toileting, dressing, and locomotion level  PARTICIPATION LIMITATIONS: meal prep, cleaning, laundry, driving, shopping, community activity, and yard work  PERSONAL FACTORS: Fitness, Sex, Time since onset of injury/illness/exacerbation, and 3+ comorbidities: PMH per chart significant for arthritis, blood clotting disorder, DDD (lumbar), DM, headache, HTN, neuropathy, vertigo  are also affecting patient's functional outcome.   REHAB POTENTIAL: Good  CLINICAL DECISION MAKING: Evolving/moderate complexity  EVALUATION COMPLEXITY: Moderate  PLAN:  PT FREQUENCY: 1-2x/week  PT DURATION: 12 weeks  PLANNED INTERVENTIONS: [id number]- PT Re-evaluation, 97110-Therapeutic exercises, 97530- Therapeutic activity, [id number]- Neuromuscular re-education, 97535- Self Care, [id number]- Manual therapy, [id number]- Gait training, [phone]- Orthotic Fit/training, [phone]- Canalith repositioning, [id number]- Electrical stimulation (manual), [id number]- Ultrasound,  Patient/Family education, Balance training, Stair training, Taping, Dry Needling, Joint mobilization, Spinal mobilization, Vestibular training, DME instructions, Wheelchair mobility training, Cryotherapy, and Moist heat  PLAN FOR NEXT SESSION:   Continue BLE strengthening. Gait as tolerated with bracing as appropriate.   Barbara Book, PT 04/05/2024, 4:21 PM

## 2024-04-09 ENCOUNTER — Other Ambulatory Visit: Payer: Self-pay | Admitting: Student in an Organized Health Care Education/Training Program

## 2024-04-10 ENCOUNTER — Ambulatory Visit: Payer: BC Managed Care – PPO | Admitting: Physical Therapy

## 2024-04-12 ENCOUNTER — Ambulatory Visit: Payer: BC Managed Care – PPO | Admitting: Physical Therapy

## 2024-04-12 DIAGNOSIS — M6281 Muscle weakness (generalized): Secondary | ICD-10-CM

## 2024-04-12 DIAGNOSIS — R2689 Other abnormalities of gait and mobility: Secondary | ICD-10-CM

## 2024-04-12 DIAGNOSIS — R2681 Unsteadiness on feet: Secondary | ICD-10-CM | POA: Diagnosis not present

## 2024-04-12 DIAGNOSIS — R262 Difficulty in walking, not elsewhere classified: Secondary | ICD-10-CM | POA: Diagnosis not present

## 2024-04-12 DIAGNOSIS — M5459 Other low back pain: Secondary | ICD-10-CM | POA: Diagnosis not present

## 2024-04-12 NOTE — Therapy (Signed)
 OUTPATIENT PHYSICAL THERAPY NEURO treatment      Patient Name: Gabriella Marsh MRN: [id number] DOB:May 15, 1966, 58 y.o., female Today's Date: 04/12/2024   PCP: Carlean Charter, DO REFERRING PROVIDER: Seena Dadds, MD  END OF SESSION:    Past Medical History:  Diagnosis Date   Arthritis    knees   Blood clotting disorder (HCC)    on toe    Degenerative disc disease, lumbar    Diabetes mellitus without complication (HCC)    GERD (gastroesophageal reflux disease)    Headache    daily - AM (has not been able to have SPG blocks lately)   Hyperlipidemia    Hypertension    Neuropathy    feet   Vertigo    3-4x/yr   Past Surgical History:  Procedure Laterality Date   ABDOMINAL HYSTERECTOMY     But still has cervix   AMPUTATION TOE Right 07/24/2021   Procedure: AMPUTATION TOE;  Surgeon: Angel Barba, DPM;  Location: [facility] ORS;  Service: Podiatry;  Laterality: Right;   CESAREAN SECTION     CHOLECYSTECTOMY     COLONOSCOPY WITH PROPOFOL  N/A 02/20/2022   Procedure: COLONOSCOPY WITH PROPOFOL ;  Surgeon: Luke Salaam, MD;  Location: [facility] ENDOSCOPY;  Service: Gastroenterology;  Laterality: N/A;   left arm frature  10/12/2020   OVARIAN CYST SURGERY     PARATHYROIDECTOMY  03/28/2021   Procedure: PARATHYROIDECTOMY AUTOTRANSPLANT;  Surgeon: Mercy Stall, MD;  Location: [facility] ORS;  Service: General;;   right femur surgery Right    SHOULDER SURGERY Left 12/26/014   Dr. Waymond Hailey, [facility]   THYROIDECTOMY N/A 03/28/2021   Procedure: THYROIDECTOMY, total;  Surgeon: Mercy Stall, MD;  Location: [facility] ORS;  Service: General;  Laterality: N/A;  Provider requesting 3 hours /180 minutes for procedure   TONSILLECTOMY AND ADENOIDECTOMY     UTERINE FIBROID SURGERY     Patient Active Problem List   Diagnosis Date Noted   Wheelchair dependence 03/10/2023   Hypertension associated with diabetes (HCC) 03/10/2023   AKI (acute kidney injury) (HCC) 01/07/2023   Neurogenic bladder  01/07/2023   FTT (failure to thrive) in adult 12/09/2022   Primary insomnia 09/10/2022   Impaired functional mobility, balance, gait, and endurance 06/09/2022   Type 2 diabetes mellitus with diabetic neuropathy, with long-term current use of insulin  (HCC) 04/24/2022   Hypothyroidism 04/24/2022   Hyperlipidemia associated with type 2 diabetes mellitus (HCC) 04/24/2022   Iron deficiency anemia 04/24/2022   Chronic sensorimotor polyneuropathy with axonal and demyelinating features 01/12/2022   Osteoarthritis of knees (Bilateral) 01/12/2022   Tricompartment osteoarthritis of knees (Bilateral) 01/12/2022    Grade 1 Retrolisthesis (9mm) of L5 over S1 01/12/2022   History of proximal humerus fracture (Left) 01/12/2022   Vitamin D  deficiency 01/12/2022   Chronic peripheral neuropathic pain 01/12/2022   Disorder of skeletal system 11/12/2021   Problems influencing health status 11/12/2021   Chronic hand pain (1ry area of Pain) (Bilateral) (L>R) 11/12/2021   Chronic feet pain (3ry area of Pain) (Bilateral) (L>R) 11/12/2021   Chronic shoulder pain (4th area of Pain) (Left) 11/12/2021   Chronic knee pain (5th area of Pain) (Bilateral) (L>R) 11/12/2021   Lumbar facet syndrome (Bilateral) 11/12/2021   COVID-19 virus infection 07/22/2021   Chronic pain syndrome 07/17/2021   S/P total thyroidectomy 03/28/2021   Thyroid  nodule    Schamberg's purpura 10/09/2019   Common migraine with intractable migraine 06/18/2015   DDD (degenerative disc disease), lumbosacral 05/03/2015   Insomnia due to medical condition 05/03/2015  Disorder of peripheral nervous system 05/03/2015   Diabetic peripheral neuropathy (HCC) 03/06/2015   Obesity 03/06/2015   GERD (gastroesophageal reflux disease) 03/06/2015   Multinodular goiter 03/06/2015   RLS (restless legs syndrome) 03/06/2015   Chronic low back pain (2ry area of Pain) (Bilateral) (L>R) w/o sciatica 03/06/2015    ONSET DATE: 06/18/22  REFERRING DIAG: S72.91XD  (ICD-10-CM) - Unspecified fracture of right femur, subsequent encounter for closed fracture with routine healing   THERAPY DIAG:  Difficulty in walking, not elsewhere classified  Muscle weakness (generalized)  Other abnormalities of gait and mobility  Unsteadiness on feet  Other low back pain  Rationale for Evaluation and Treatment: Rehabilitation  SUBJECTIVE:                                                                                                                                                                                             SUBJECTIVE STATEMENT:   Pt reports that she is doing okay, but still reports limited  Increased swelling in BLE, R>L, limits ability for pt to wear AFOs on this day.   Pt accompanied by:  Spouse   PERTINENT HISTORY: Pt is a pleasant 58 y/o female known to PT clinic, returning for impairments due to R femur fx sustained in 2023. Pt hx includes fall in June 2023 where pt suffered R femur fx followed by surgery where pt had plate and screws placed in R knee and rod in R femur. At the time pt with hx of multiple falls, now pt reports no falls in last 6 months. Since previous d/c from PT on 05/13/2023, pt has been consistently going to [facility] clinic 1x/week, and now returns to [facility] for new year. Pt reports she ambulated with RW while at [facility] clinic, has been able to perform STS to RW from higher surfaces. She would like to continue working on her mobility and strength, including both UE and LE strength. She still struggles with putting on her shoes and socks and getting from her bed to the commode. She reports standing at [facility] also limited due to chronic low back pain. She reports she'll likely get injections to tx LBP in future. Pt still performing previous HEP given, typically she completes 1 set of each exercise for 25-30 reps. Says this is easy. PMH per chart significant for arthritis, blood clotting disorder, DDD (lumbar), DM, headache, HTN, neuropathy,  vertigo   PAIN:  Are you having pain? Yes: NPRS scale: not numerically rated Pain location: low back, bilat hand pain "hurts a lot all the time"  Pain description:   Aggravating factors: standing limited due to LBP in  position Relieving factors: wearing gloves helps with hand pain  PRECAUTIONS: Fall  RED FLAGS: None   WEIGHT BEARING RESTRICTIONS: No  FALLS: Has patient fallen in last 6 months? No  LIVING ENVIRONMENT: via chart, pt confirms the following is still correct Lives with: lives with their family and lives with their spouse Lives in: House/apartment Stairs: No Has following equipment at home: Environmental consultant - 2 wheeled, Wheelchair (manual), Shower bench, bed side commode, and Grab bars  PLOF: Needs assistance with ADLs  PATIENT GOALS: Get out from bed to walker to bedside commode with or without LE braces donned, improve strength in UE/LE  OBJECTIVE:  Note: Objective measures were completed at Evaluation unless otherwise noted.  DIAGNOSTIC FINDINGS:   Via chart [facility] 05/13/2023 XR Femur 2 views right: "Impression  Unchanged position and alignment with further osseous bridging at the distal femur fracture following intramedullary rod fixation." And findings "FINDINGS:  Position and alignment of the comminuted distal femur fracture are unchanged after placement of a retrograde interlocking intramedullary rod. Proximal and distal interlocking components articulate appropriately with the rod. There is progressive osseous bridging with remodeling at the fracture site, there is no new fracture or dislocation. "  COGNITION: Overall cognitive status: Within functional limits for tasks assessed   SENSATION: Reports impaired BLE, does have sensation on tops of thighs per report   COORDINATION: WFL UE rapid alt movement WFL LE with heel to shin, but unable to perform rapid alt movement due to weakness in bilat DFs  EDEMA:  Reports no swelling    POSTURE: rounded  shoulders and forward head  LOWER EXTREMITY ROM:     Slight impairment with R ankle DF > L with PROM Slight impairment of L knee extension and somewhat painful    LOWER EXTREMITY MMT:    Grossly 4/5 in BLE exception is pt unable to complete DF bilat, other areas of significant mm deficit include hip flexors, abductors and hamstrings bilat  TRANSFERS:  Attempted STS to RW from WC during eval. Unable to perform from wheelchair even with max assist. Pt reports she has been able to complete STS from higher surfaces and this is how she practiced in [facility]  Assistive device utilized: Walker - 2 wheeled  Sit to stand:  unable from standard chair height/WC see above  FUNCTIONAL TESTS:  Attempted STS from [facility], unable at this time. Will attempt functional test like 30 sec STS in future as pt able  PATIENT SURVEYS:  LEFS 50/80 FOTO 54                                                                                                                              TREATMENT DATE: 12/29/2023 Nustep x 8 min level 1-4, interval training, cues for consistent SPM through varied resistance. Pt rates as easy-medium.   Gait with HDRW 66ft +77ft forward/reverse. No AFOs cues for step length as able and to initiate DF with limb  advancement to reduce foot drag and toe tounch down, only mild improvement on the LLE, no change on the RLE.   Sit<>stand from 25inches x 8, 24inches x 8, 23 inches x 5.  Cues for posture full extension in to standing at bil knees and hips.   HS curl RTB LAQ 2# x 12  Hip flexion x 10 bil 2#  LE push down x 12 with manual resistance on the RLE and RTB on the LLE due to sensitivity in the posterior  L thigh.  Ankle DF x 15 bil with AAROM for full available ROM.  Stand pivot transfer to [facility] with min assist at end of session with cues for step width in turn to reduce fall risk.     PATIENT EDUCATION: Education details: assessment findings, goals, plan, HEP. WC planning HEP  expansion  Pt educated throughout session about proper posture and technique with exercises. Improved exercise technique, movement at target joints, use of target muscles after min to mod verbal, visual, tactile cues.  Person educated: Patient and Spouse Education method: Explanation, Verbal cues, and Handouts Education comprehension: verbalized understanding and returned demonstration  HOME EXERCISE PROGRAM: Access Code: [email or web address] URL: [email or web address] Date: 12/29/2023 Prepared by: Aminta Kales  Exercises - Seated Hamstring Curls with Resistance  - 1 x daily - 5-6 x weekly - 3-4 sets - 20-25 reps - Seated Hip Abduction with Resistance  - 1 x daily - 5-6 x weekly - 3-4 sets - 20-25 reps - Seated March  - 1 x daily - 5-6 x weekly - 3-4 sets - 20-25 reps   Access Code: [email or web address] URL: [email or web address] Date: 01/26/2024 Prepared by: Aurora Lees  Exercises - Supine Heel Slide  - 1 x daily - 7 x weekly - 3 sets - 10 reps - Supine Straight Leg Raises  - 1 x daily - 7 x weekly - 3 sets - 10 reps - Hooklying Clamshell with Resistance  - 1 x daily - 7 x weekly - 3 sets - 10 reps - Supine Hip Flexion  - 1 x daily - 7 x weekly - 3 sets - 10 reps - Supine Single Leg Ankle Pumps  - 1 x daily - 7 x weekly - 3 sets - 10 reps - Supine Bridge  - 1 x daily - 7 x weekly - 3 sets - 10 reps GOALS: Goals reviewed with patient? Yes  SHORT TERM GOALS: Target date: 02/09/2024   Patient will be independent in home exercise program to improve strength/mobility for better functional independence with ADLs. Baseline: initiated Goal status: INITIAL    LONG TERM GOALS: Target date: 03/22/2024   Patient will perform sit to stand with min-mod assist from standard chair height in order to indicate improved lower extremity functional strength and improved ability to perform functional transfers and functional leg activities within her home.  Baseline: mod assist with +2  for safety.  4/2: mod assist  Goal status: INITIAL  2.  Pt will improve LEFS by 10 points or more in order to indicate improved LE function and mobility.  Baseline: 50 4/2: 32 Goal status: INITIAL  3.  Patient will increase FOTO score to equal to or greater than  59   to demonstrate statistically significant improvement in mobility and quality of life.  Baseline: 54 Goal status: goal d/c'ed.   4.  Pt will require no more than min assist for transfer from bed to bedside commode Baseline: supervision assist with  slide board transfer. To mat table  Using walker for transfer to [facility] with mod assist for safety and  Goal status: progressing   5.  The pt will demo ability to don socks and shoes indep or mod I for increased ease with dressing and improved independence Baseline: required max assist from husband  4/2: pt states that she is donning night socks. Is still requiring max assist for LLE and moderate assist for the RLE  Goal status: Progressing  6.  The pt will demo ability to complete 30 second STS test to indicate increased ease with transfers and BLE strength Baseline: 2 sit<>stand from [facility] with cushion in place and  +2 for safety.  4/2: 3 with mod assist and +2 to stabilize RW  Goal status: progressing    ASSESSMENT:  CLINICAL IMPRESSION:  Patient is a pleasant 58 y.o. female who was seen today for physical therapy treatment for impairments related to R femur fx sustained in 2023. Pt continues to demonstrate improved balance, and BLE strength with increased ROM in all muscle groups. Will continue to increase resistance or repetitions to increase physical demand on BLE increase strength to allow increased independence with ADLs and mobility.  The pt will benefit from further skilled PT to improve these impairments in order to increase ease with ADLs, QOL and to reduce fall risk.   OBJECTIVE IMPAIRMENTS: Abnormal gait, decreased activity tolerance, decreased balance, decreased  coordination, decreased endurance, decreased mobility, difficulty walking, decreased ROM, decreased strength, hypomobility, impaired sensation, improper body mechanics, postural dysfunction, and pain.   ACTIVITY LIMITATIONS: carrying, lifting, bending, standing, squatting, stairs, transfers, bed mobility, bathing, toileting, dressing, and locomotion level  PARTICIPATION LIMITATIONS: meal prep, cleaning, laundry, driving, shopping, community activity, and yard work  PERSONAL FACTORS: Fitness, Sex, Time since onset of injury/illness/exacerbation, and 3+ comorbidities: PMH per chart significant for arthritis, blood clotting disorder, DDD (lumbar), DM, headache, HTN, neuropathy, vertigo  are also affecting patient's functional outcome.   REHAB POTENTIAL: Good  CLINICAL DECISION MAKING: Evolving/moderate complexity  EVALUATION COMPLEXITY: Moderate  PLAN:  PT FREQUENCY: 1-2x/week  PT DURATION: 12 weeks  PLANNED INTERVENTIONS: 97164- PT Re-evaluation, 97110-Therapeutic exercises, 97530- Therapeutic activity, [id number]- Neuromuscular re-education, 97535- Self Care, [id number]- Manual therapy, [id number]- Gait training, [phone]- Orthotic Fit/training, [phone]- Canalith repositioning, [id number]- Electrical stimulation (manual), [id number]- Ultrasound, Patient/Family education, Balance training, Stair training, Taping, Dry Needling, Joint mobilization, Spinal mobilization, Vestibular training, DME instructions, Wheelchair mobility training, Cryotherapy, and Moist heat  PLAN FOR NEXT SESSION:   Continue BLE strengthening.  Gait as tolerated with bracing as appropriate.   Barbara Book, PT 04/12/2024, 4:16 PM

## 2024-04-15 ENCOUNTER — Encounter: Payer: Self-pay | Admitting: Family Medicine

## 2024-04-17 ENCOUNTER — Ambulatory Visit: Payer: BC Managed Care – PPO | Admitting: Physical Therapy

## 2024-04-18 DIAGNOSIS — E1165 Type 2 diabetes mellitus with hyperglycemia: Secondary | ICD-10-CM | POA: Diagnosis not present

## 2024-04-19 ENCOUNTER — Ambulatory Visit: Payer: BC Managed Care – PPO | Admitting: Physical Therapy

## 2024-04-19 DIAGNOSIS — R2689 Other abnormalities of gait and mobility: Secondary | ICD-10-CM

## 2024-04-19 DIAGNOSIS — M5459 Other low back pain: Secondary | ICD-10-CM | POA: Diagnosis not present

## 2024-04-19 DIAGNOSIS — R2681 Unsteadiness on feet: Secondary | ICD-10-CM

## 2024-04-19 DIAGNOSIS — R262 Difficulty in walking, not elsewhere classified: Secondary | ICD-10-CM | POA: Diagnosis not present

## 2024-04-19 DIAGNOSIS — M6281 Muscle weakness (generalized): Secondary | ICD-10-CM | POA: Diagnosis not present

## 2024-04-19 NOTE — Therapy (Signed)
 OUTPATIENT PHYSICAL THERAPY NEURO treatment      Patient Name: Gabriella Marsh MRN: [id number] DOB:Nov 07, 1966, 58 y.o., female Today's Date: 04/19/2024   PCP: Carlean Charter, DO REFERRING PROVIDER: Seena Dadds, MD  END OF SESSION:  PT End of Session - 04/19/24 1702     Visit Number 13    Number of Visits 25    Date for PT Re-Evaluation 05/31/24    Progress Note Due on Visit 10    PT Start Time 1623    PT Stop Time 1701    PT Time Calculation (min) 38 min    Equipment Utilized During Treatment Gait belt    Activity Tolerance Patient tolerated treatment well    Behavior During Therapy WFL for tasks assessed/performed              Past Medical History:  Diagnosis Date   Arthritis    knees   Blood clotting disorder (HCC)    on toe    Degenerative disc disease, lumbar    Diabetes mellitus without complication (HCC)    GERD (gastroesophageal reflux disease)    Headache    daily - AM (has not been able to have SPG blocks lately)   Hyperlipidemia    Hypertension    Neuropathy    feet   Vertigo    3-4x/yr   Past Surgical History:  Procedure Laterality Date   ABDOMINAL HYSTERECTOMY     But still has cervix   AMPUTATION TOE Right 07/24/2021   Procedure: AMPUTATION TOE;  Surgeon: Angel Barba, DPM;  Location: [facility] ORS;  Service: Podiatry;  Laterality: Right;   CESAREAN SECTION     CHOLECYSTECTOMY     COLONOSCOPY WITH PROPOFOL  N/A 02/20/2022   Procedure: COLONOSCOPY WITH PROPOFOL ;  Surgeon: Luke Salaam, MD;  Location: [facility]COPY;  Service: Gastroenterology;  Laterality: N/A;   left arm frature  10/12/2020   OVARIAN CYST SURGERY     PARATHYROIDECTOMY  03/28/2021   Procedure: PARATHYROIDECTOMY AUTOTRANSPLANT;  Surgeon: Mercy Stall, MD;  Location: [facility] ORS;  Service: General;;   right femur surgery Right    SHOULDER SURGERY Left 12/26/014   Dr. Waymond Hailey, [facility]   THYROIDECTOMY N/A 03/28/2021   Procedure: THYROIDECTOMY, total;  Surgeon: Mercy Stall, MD;  Location: [facility] ORS;  Service: General;  Laterality: N/A;  Provider requesting 3 hours /180 minutes for procedure   TONSILLECTOMY AND ADENOIDECTOMY     UTERINE FIBROID SURGERY     Patient Active Problem List   Diagnosis Date Noted   Wheelchair dependence 03/10/2023   Hypertension associated with diabetes (HCC) 03/10/2023   AKI (acute kidney injury) (HCC) 01/07/2023   Neurogenic bladder 01/07/2023   FTT (failure to thrive) in adult 12/09/2022   Primary insomnia 09/10/2022   Impaired functional mobility, balance, gait, and endurance 06/09/2022   Type 2 diabetes mellitus with diabetic neuropathy, with long-term current use of insulin  (HCC) 04/24/2022   Hypothyroidism 04/24/2022   Hyperlipidemia associated with type 2 diabetes mellitus (HCC) 04/24/2022   Iron deficiency anemia 04/24/2022   Chronic sensorimotor polyneuropathy with axonal and demyelinating features 01/12/2022   Osteoarthritis of knees (Bilateral) 01/12/2022   Tricompartment osteoarthritis of knees (Bilateral) 01/12/2022    Grade 1 Retrolisthesis (9mm) of L5 over S1 01/12/2022   History of proximal humerus fracture (Left) 01/12/2022   Vitamin D  deficiency 01/12/2022   Chronic peripheral neuropathic pain 01/12/2022   Disorder of skeletal system 11/12/2021   Problems influencing health status 11/12/2021   Chronic hand pain (1ry  area of Pain) (Bilateral) (L>R) 11/12/2021   Chronic feet pain (3ry area of Pain) (Bilateral) (L>R) 11/12/2021   Chronic shoulder pain (4th area of Pain) (Left) 11/12/2021   Chronic knee pain (5th area of Pain) (Bilateral) (L>R) 11/12/2021   Lumbar facet syndrome (Bilateral) 11/12/2021   COVID-19 virus infection 07/22/2021   Chronic pain syndrome 07/17/2021   S/P total thyroidectomy 03/28/2021   Thyroid  nodule    Schamberg's purpura 10/09/2019   Common migraine with intractable migraine 06/18/2015   DDD (degenerative disc disease), lumbosacral 05/03/2015   Insomnia due to medical  condition 05/03/2015   Disorder of peripheral nervous system 05/03/2015   Diabetic peripheral neuropathy (HCC) 03/06/2015   Obesity 03/06/2015   GERD (gastroesophageal reflux disease) 03/06/2015   Multinodular goiter 03/06/2015   RLS (restless legs syndrome) 03/06/2015   Chronic low back pain (2ry area of Pain) (Bilateral) (L>R) w/o sciatica 03/06/2015    ONSET DATE: 06/18/22  REFERRING DIAG: S72.91XD (ICD-10-CM) - Unspecified fracture of right femur, subsequent encounter for closed fracture with routine healing   THERAPY DIAG:  Difficulty in walking, not elsewhere classified  Muscle weakness (generalized)  Other abnormalities of gait and mobility  Unsteadiness on feet  Other low back pain  Rationale for Evaluation and Treatment: Rehabilitation  SUBJECTIVE:                                                                                                                                                                                             SUBJECTIVE STATEMENT:   Pt reports that she is doing well. Reports that she is walking more at home and performing sit<>stand transfers independently form elevated recliner throughout the day. Was able to don Bil AFO on this day with reduced bil swelling.   Pt accompanied by:  Spouse   PERTINENT HISTORY: Pt is a pleasant 58 y/o female known to PT clinic, returning for impairments due to R femur fx sustained in 2023. Pt hx includes fall in June 2023 where pt suffered R femur fx followed by surgery where pt had plate and screws placed in R knee and rod in R femur. At the time pt with hx of multiple falls, now pt reports no falls in last 6 months. Since previous d/c from PT on 05/13/2023, pt has been consistently going to [facility] clinic 1x/week, and now returns to [facility] for new year. Pt reports she ambulated with RW while at [facility] clinic, has been able to perform STS to RW from higher surfaces. She would like to continue working on her mobility and  strength, including both UE and LE strength. She still struggles with putting  on her shoes and socks and getting from her bed to the commode. She reports standing at [facility] also limited due to chronic low back pain. She reports she'll likely get injections to tx LBP in future. Pt still performing previous HEP given, typically she completes 1 set of each exercise for 25-30 reps. Says this is easy. PMH per chart significant for arthritis, blood clotting disorder, DDD (lumbar), DM, headache, HTN, neuropathy, vertigo   PAIN:  Are you having pain? Yes: NPRS scale: not numerically rated Pain location: low back, bilat hand pain "hurts a lot all the time"  Pain description:   Aggravating factors: standing limited due to LBP in position Relieving factors: wearing gloves helps with hand pain  PRECAUTIONS: Fall  RED FLAGS: None   WEIGHT BEARING RESTRICTIONS: No  FALLS: Has patient fallen in last 6 months? No  LIVING ENVIRONMENT: via chart, pt confirms the following is still correct Lives with: lives with their family and lives with their spouse Lives in: House/apartment Stairs: No Has following equipment at home: Environmental consultant - 2 wheeled, Wheelchair (manual), Shower bench, bed side commode, and Grab bars  PLOF: Needs assistance with ADLs  PATIENT GOALS: Get out from bed to walker to bedside commode with or without LE braces donned, improve strength in UE/LE  OBJECTIVE:  Note: Objective measures were completed at Evaluation unless otherwise noted.  DIAGNOSTIC FINDINGS:   Via chart [facility] 05/13/2023 XR Femur 2 views right: "Impression  Unchanged position and alignment with further osseous bridging at the distal femur fracture following intramedullary rod fixation." And findings "FINDINGS:  Position and alignment of the comminuted distal femur fracture are unchanged after placement of a retrograde interlocking intramedullary rod. Proximal and distal interlocking components articulate  appropriately with the rod. There is progressive osseous bridging with remodeling at the fracture site, there is no new fracture or dislocation. "  COGNITION: Overall cognitive status: Within functional limits for tasks assessed   SENSATION: Reports impaired BLE, does have sensation on tops of thighs per report   COORDINATION: WFL UE rapid alt movement WFL LE with heel to shin, but unable to perform rapid alt movement due to weakness in bilat DFs  EDEMA:  Reports no swelling    POSTURE: rounded shoulders and forward head  LOWER EXTREMITY ROM:     Slight impairment with R ankle DF > L with PROM Slight impairment of L knee extension and somewhat painful    LOWER EXTREMITY MMT:    Grossly 4/5 in BLE exception is pt unable to complete DF bilat, other areas of significant mm deficit include hip flexors, abductors and hamstrings bilat  TRANSFERS:  Attempted STS to RW from WC during eval. Unable to perform from wheelchair even with max assist. Pt reports she has been able to complete STS from higher surfaces and this is how she practiced in [facility]  Assistive device utilized: Walker - 2 wheeled  Sit to stand:  unable from standard chair height/WC see above  FUNCTIONAL TESTS:  Attempted STS from [facility], unable at this time. Will attempt functional test like 30 sec STS in future as pt able  PATIENT SURVEYS:  LEFS 50/80 FOTO 54  TREATMENT DATE: 12/29/2023 Gait with AFO and KAFO x 25ft with CGA for safety. One near LOB due to LLE stuck on floot from knee extension lock out in KAFO. PT unlocked brace, and pt able to advance the LLE with knee and hip flexion to continue.  Sit<>stand x 10 + 8 with UE support on RW and arm rest. Figure 8 x 2 with therapeutic rest break between bouts. With RW and AFOs.  Seated LAQ x 12  Seated hip flexion x 12 bil  Stand pivot  transfer to and from [facility] with CGA/supervision assist from PT and heavy use arm rests.   Throughout session, pt required therapeutic rest breaks due to BLE fatigue and mild SOB upon completion of each intervention.   Education for decreased need of KAFO due to increased L knee strength. Pt reports that she [facility].  Aaron Aas     PATIENT EDUCATION: Education details: assessment findings, goals, plan, HEP. WC planning HEP expansion  Pt educated throughout session about proper posture and technique with exercises. Improved exercise technique, movement at target joints, use of target muscles after min to mod verbal, visual, tactile cues.  Person educated: Patient and Spouse Education method: Explanation, Verbal cues, and Handouts Education comprehension: verbalized understanding and returned demonstration  HOME EXERCISE PROGRAM: Access Code: [email or web address] URL: [email or web address] Date: 12/29/2023 Prepared by: Aminta Kales  Exercises - Seated Hamstring Curls with Resistance  - 1 x daily - 5-6 x weekly - 3-4 sets - 20-25 reps - Seated Hip Abduction with Resistance  - 1 x daily - 5-6 x weekly - 3-4 sets - 20-25 reps - Seated March  - 1 x daily - 5-6 x weekly - 3-4 sets - 20-25 reps   Access Code: [email or web address] URL: [email or web address] Date: 01/26/2024 Prepared by: Aurora Lees  Exercises - Supine Heel Slide  - 1 x daily - 7 x weekly - 3 sets - 10 reps - Supine Straight Leg Raises  - 1 x daily - 7 x weekly - 3 sets - 10 reps - Hooklying Clamshell with Resistance  - 1 x daily - 7 x weekly - 3 sets - 10 reps - Supine Hip Flexion  - 1 x daily - 7 x weekly - 3 sets - 10 reps - Supine Single Leg Ankle Pumps  - 1 x daily - 7 x weekly - 3 sets - 10 reps - Supine Bridge  - 1 x daily - 7 x weekly - 3 sets - 10 reps GOALS: Goals reviewed with patient? Yes  SHORT TERM GOALS: Target date: 02/09/2024   Patient will be independent in home exercise program  to improve strength/mobility for better functional independence with ADLs. Baseline: initiated Goal status: INITIAL    LONG TERM GOALS: Target date: 03/22/2024   Patient will perform sit to stand with min-mod assist from standard chair height in order to indicate improved lower extremity functional strength and improved ability to perform functional transfers and functional leg activities within her home.  Baseline: mod assist with +2 for safety.  4/2: mod assist  Goal status: INITIAL  2.  Pt will improve LEFS by 10 points or more in order to indicate improved LE function and mobility.  Baseline: 50 4/2: 32 Goal status: INITIAL  3.  Patient will increase FOTO score to equal to or greater than  59   to demonstrate statistically significant improvement in mobility and quality of life.  Baseline: 54 Goal status: goal d/c'ed.   4.  Pt will require no more than min assist for transfer from bed to bedside commode Baseline: supervision assist with  slide board transfer. To mat table  Using walker for transfer to [facility] with mod assist for safety and  Goal status: progressing   5.  The pt will demo ability to don socks and shoes indep or mod I for increased ease with dressing and improved independence Baseline: required max assist from husband  4/2: pt states that she is donning night socks. Is still requiring max assist for LLE and moderate assist for the RLE  Goal status: Progressing  6.  The pt will demo ability to complete 30 second STS test to indicate increased ease with transfers and BLE strength Baseline: 2 sit<>stand from [facility] with cushion in place and  +2 for safety.  4/2: 3 with mod assist and +2 to stabilize RW  Goal status: progressing    ASSESSMENT:  CLINICAL IMPRESSION:  Patient is a pleasant 58 y.o. female who was seen today for physical therapy treatment for impairments related to R femur fx sustained in 2023. Pt continues to demonstrate improved balance, and BLE strength  with increased ROM in all muscle groups. Tolerated dynamic gait training with RW to perform figure 8 and significantly reduced reliance on UE support for ambulation. Improved knee strength is allowing LLE advancement and no buckling with decreased use of KAFO; may benefit from removal of thigh support on AFO.   The pt will benefit from further skilled PT to improve these impairments in order to increase ease with ADLs, QOL and to reduce fall risk.   OBJECTIVE IMPAIRMENTS: Abnormal gait, decreased activity tolerance, decreased balance, decreased coordination, decreased endurance, decreased mobility, difficulty walking, decreased ROM, decreased strength, hypomobility, impaired sensation, improper body mechanics, postural dysfunction, and pain.   ACTIVITY LIMITATIONS: carrying, lifting, bending, standing, squatting, stairs, transfers, bed mobility, bathing, toileting, dressing, and locomotion level  PARTICIPATION LIMITATIONS: meal prep, cleaning, laundry, driving, shopping, community activity, and yard work  PERSONAL FACTORS: Fitness, Sex, Time since onset of injury/illness/exacerbation, and 3+ comorbidities: PMH per chart significant for arthritis, blood clotting disorder, DDD (lumbar), DM, headache, HTN, neuropathy, vertigo  are also affecting patient's functional outcome.   REHAB POTENTIAL: Good  CLINICAL DECISION MAKING: Evolving/moderate complexity  EVALUATION COMPLEXITY: Moderate  PLAN:  PT FREQUENCY: 1-2x/week  PT DURATION: 12 weeks  PLANNED INTERVENTIONS: 97164- PT Re-evaluation, 97110-Therapeutic exercises, 97530- Therapeutic activity, [id number]- Neuromuscular re-education, 97535- Self Care, 16109- Manual therapy, [id number]- Gait training, [phone]- Orthotic Fit/training, [phone]- Canalith repositioning, [id number]- Electrical stimulation (manual), [id number]- Ultrasound, Patient/Family education, Balance training, Stair training, Taping, Dry Needling, Joint mobilization, Spinal mobilization, Vestibular  training, DME instructions, Wheelchair mobility training, Cryotherapy, and Moist heat  PLAN FOR NEXT SESSION:   Continue BLE strengthening.  Gait as tolerated with bracing as appropriate.   Barbara Book, PT 04/19/2024, 5:09 PM

## 2024-04-19 NOTE — Addendum Note (Signed)
 Addended by: Amily Depp E on: 04/19/2024 04:57 PM   Modules accepted: Orders

## 2024-04-21 ENCOUNTER — Encounter: Payer: Self-pay | Admitting: Physician Assistant

## 2024-04-21 ENCOUNTER — Ambulatory Visit (INDEPENDENT_AMBULATORY_CARE_PROVIDER_SITE_OTHER): Admitting: Physician Assistant

## 2024-04-21 VITALS — BP 123/75 | HR 80 | Resp 16 | Ht 70.0 in | Wt 243.0 lb

## 2024-04-21 DIAGNOSIS — G629 Polyneuropathy, unspecified: Secondary | ICD-10-CM | POA: Insufficient documentation

## 2024-04-21 DIAGNOSIS — I890 Lymphedema, not elsewhere classified: Secondary | ICD-10-CM | POA: Diagnosis not present

## 2024-04-21 DIAGNOSIS — E1140 Type 2 diabetes mellitus with diabetic neuropathy, unspecified: Secondary | ICD-10-CM | POA: Diagnosis not present

## 2024-04-21 DIAGNOSIS — E559 Vitamin D deficiency, unspecified: Secondary | ICD-10-CM | POA: Diagnosis not present

## 2024-04-21 DIAGNOSIS — E042 Nontoxic multinodular goiter: Secondary | ICD-10-CM | POA: Diagnosis not present

## 2024-04-21 DIAGNOSIS — Z794 Long term (current) use of insulin: Secondary | ICD-10-CM | POA: Diagnosis not present

## 2024-04-21 NOTE — Progress Notes (Signed)
 Established patient visit  Patient: Gabriella Marsh   DOB: 06-22-1966   58 y.o. Female  MRN: [id number] Visit Date: 04/21/2024  Today's healthcare provider: Blane Bunting, PA-C   Chief Complaint  Patient presents with   Hospitalization Follow-up    F/u from hospital visit , DM , SOB every one in awhile, Swelling in ft and lgs. Problems with Meter reading high   Subjective      Discussed the use of AI scribe software for clinical note transcription with the patient, who gave verbal consent to proceed.  History of Present Illness Gabriella Marsh is a 58 year old female with diabetes and neuropathy who presents with concerns about blood sugar management and neuropathic pain.  She experiences significant neuropathic pain, describing it as a 'texture thing' where touching objects, including her own hair, causes pain. She wears gloves constantly, even while showering, to manage this discomfort. Lymphedema is managed with physical therapy, and she has resumed wearing leg braces during therapy after a decrease in swelling.  Diabetes is managed with insulin  and metformin . She uses a Dexcom sensor for glucose monitoring but reports issues with deployment and discrepancies compared to fingerstick tests. Blood sugar readings are consistently high, around 400 mg/dL, despite taking 40 units of insulin  at mealtimes and 24 units of Lantus  at night. She also takes Ozempic  but reduced the dose due to gastrointestinal side effects. She is concerned about the accuracy of her glucose monitoring and the effectiveness of her current insulin  regimen.  She has a history of a femur fracture in her dominant leg, which required surgical intervention. She experiences difficulty bending her leg and pain when standing, which is being addressed in physical therapy. [place] walker for mobility.       03/28/2024    3:09 PM 01/11/2024    4:07 PM 10/07/2023     2:54 PM  Depression screen PHQ 2/9  Decreased Interest 0 0 0  Down, Depressed, Hopeless 0 0 0  PHQ - 2 Score 0 0 0  Altered sleeping  1   Tired, decreased energy  1   Change in appetite  2   Feeling bad or failure about yourself   0   Trouble concentrating  0   Moving slowly or fidgety/restless  0   Suicidal thoughts  0   PHQ-9 Score  4       01/11/2024    4:08 PM 10/21/2017    8:38 AM  GAD 7 : Generalized Anxiety Score  Nervous, Anxious, on Edge 0 2  Control/stop worrying 0 3  Worry too much - different things 0 3  Trouble relaxing 0 3  Restless 1 0  Easily annoyed or irritable 2 3  Afraid - awful might happen 0 3  Total GAD 7 Score 3 17  Anxiety Difficulty  Not difficult at all    Medications: Outpatient Medications Prior to Visit  Medication Sig   acetaminophen  (TYLENOL ) 500 MG tablet Take 1,000 mg by mouth every 12 (twelve) hours as needed for moderate pain.   atorvastatin  (LIPITOR) 20 MG tablet TAKE 1 TABLET(20 MG) BY MOUTH DAILY   cyclobenzaprine  (FLEXERIL ) 5 MG tablet Take 1 tablet (5 mg total) by mouth 3 (three) times daily as needed for muscle spasms.   fluticasone  (FLONASE ) 50 MCG/ACT nasal spray Place 2 sprays into both nostrils daily.   gabapentin  (NEURONTIN ) 800 MG tablet Take 1 tablet (800 mg total) by  mouth 3 (three) times daily.   insulin  aspart (NOVOLOG ) 100 UNIT/ML FlexPen Inject 4 Units into the skin 3 (three) times daily with meals. Hold if not eating.   insulin  glargine (LANTUS  SOLOSTAR) 100 UNIT/ML Solostar Pen Inject 24 Units into the skin at bedtime.   Insulin  Pen Needle 30G X 5 MM MISC 1 each by Does not apply route in the morning, at noon, in the evening, and at bedtime.   levothyroxine  (SYNTHROID ) 112 MCG tablet Take 2 tablets (224 mcg total) by mouth daily.   lisinopril  (ZESTRIL ) 2.5 MG tablet Take 1 tablet (2.5 mg total) by mouth daily.   Melatonin 10 MG TABS Take 10 mg by mouth at bedtime.   metFORMIN  (GLUCOPHAGE ) 500 MG tablet Take 2 tablets  (1,000 mg total) by mouth 2 (two) times daily with a meal.   nortriptyline  (PAMELOR ) 50 MG capsule Take 1 capsule (50 mg total) by mouth at bedtime.   nystatin -triamcinolone  ointment (MYCOLOG) Apply 1 Application topically 2 (two) times daily.   omeprazole  (PRILOSEC) 20 MG capsule Take 1 capsule (20 mg total) by mouth daily. TAKE 1 CAPSULE DAILY   [START ON 05/18/2024] oxyCODONE -acetaminophen  (PERCOCET) 5-325 MG tablet Take 1 tablet by mouth every 8 (eight) hours. Must last 30 days.   [START ON 06/17/2024] oxyCODONE -acetaminophen  (PERCOCET) 5-325 MG tablet Take 1 tablet by mouth every 8 (eight) hours. Must last 30 days.   oxyCODONE -acetaminophen  (PERCOCET) 5-325 MG tablet Take 1 tablet by mouth every 8 (eight) hours. Must last 30 days.   OZEMPIC , 0.25 OR 0.5 MG/DOSE, 2 MG/3ML SOPN INJECT 0.5MG  UNDER THE SKIN ONCE A WEEK   traZODone  (DESYREL ) 100 MG tablet Take 1 tablet (100 mg total) by mouth at bedtime.   furosemide  (LASIX ) 20 MG tablet Take 1 tablet (20 mg total) by mouth daily for 3 days.   No facility-administered medications prior to visit.    Review of Systems All negative Except see HPI       Objective    BP 123/75 (BP Location: Left Arm, Patient Position: Sitting, Cuff Size: Large)   Pulse 80   Resp 16   Ht 5\' 10"  (1.778 m)   Wt 243 lb (110.2 kg)   SpO2 95%   BMI 34.87 kg/m     Physical Exam Vitals reviewed.  Constitutional:      General: She is not in acute distress.    Appearance: Normal appearance. She is well-developed. She is not diaphoretic.  HENT:     Head: Normocephalic and atraumatic.  Eyes:     General: No scleral icterus.    Conjunctiva/sclera: Conjunctivae normal.  Neck:     Thyroid : No thyromegaly.  Cardiovascular:     Rate and Rhythm: Normal rate and regular rhythm.     Pulses: Normal pulses.     Heart sounds: Normal heart sounds. No murmur heard. Pulmonary:     Effort: Pulmonary effort is normal. No respiratory distress.     Breath sounds:  Normal breath sounds. No wheezing, rhonchi or rales.  Musculoskeletal:     Cervical back: Neck supple.     Right lower leg: No edema.     Left lower leg: No edema.  Lymphadenopathy:     Cervical: No cervical adenopathy.  Skin:    General: Skin is warm and dry.     Findings: No rash.  Neurological:     Mental Status: She is alert and oriented to person, place, and time. Mental status is at baseline.  Psychiatric:  Mood and Affect: Mood normal.        Behavior: Behavior normal.    Ambulates with wheelchair  No results found for any visits on 04/21/24.      Assessment & Plan Diabetes mellitus with hyperglycemia Chronic and uncontrolled Persistent hyperglycemia with possible Dexcom sensor inaccuracy. Current regimen includes Lantus , Novolog , metformin , and Ozempic . Recent Ozempic  adjustment /0.5mg  weekly due to side effects. Discussed potential switch to [place] sensor. A sample was provided. However, it is unclear if it will be covered by pt's insurance - Refer to clinic pharmacist for insulin  regimen review and adjustment. - Increase Novolog  by 2 units and monitor response. - Consider trial of Libre sensor for continuous glucose monitoring. - Order blood work for medication side effects and diabetes management. - Schedule follow-up with pharmacist next week. - Discuss sliding scale insulin  regimen with pharmacist.  Neuropathy Chronic and worsening Neuropathy causing significant pain and sensitivity, could be related to uncontrolled diabetes, affecting quality of life. Advised BS control Advised lifestyle modifications  Lymphedema Chronic Swelling decreased, allowing resumption of therapy with leg braces. - Continue physical therapy to manage lymphedema. Will reassess with Dr. Athena Bland  Hx of Femur fracture with surgical repair Ongoing pain and mobility issues post-surgical repair with rod and hardware, affecting daily activities.  No orders of the defined types were  placed in this encounter.   No follow-ups on file.   The patient was advised to call back or seek an in-person evaluation if the symptoms worsen or if the condition fails to improve as anticipated.  I discussed the assessment and treatment plan with the patient. The patient was provided an opportunity to ask questions and all were answered. The patient agreed with the plan and demonstrated an understanding of the instructions. I spent 30 Minutes caring for his patient today face to face, reviewing labs, records from [facility] ED, performing a medically appropriate examination and /or evaluation, counseling and educating the patient on lymphedema, uncontrolled diabetes, neuropathy, documenting in the record   I, Blane Bunting, PA-C have reviewed all documentation for this visit. The documentation on 04/21/2024  for the exam, diagnosis, procedures, and orders are all accurate and complete.  Blane Bunting, [facility], MMS [facility] [phone] (phone) [phone] (fax)  [facility]

## 2024-04-23 LAB — LIPID PANEL
Chol/HDL Ratio: 4.6 ratio — ABNORMAL HIGH (ref 0.0–4.4)
Cholesterol, Total: 166 mg/dL (ref 100–199)
HDL: 36 mg/dL — ABNORMAL LOW (ref >39)
LDL Chol Calc (NIH): 70 mg/dL (ref 0–99)
Triglycerides: 377 mg/dL — ABNORMAL HIGH (ref 0–149)
VLDL Cholesterol Cal: 60 mg/dL — ABNORMAL HIGH (ref 5–40)

## 2024-04-23 LAB — HEMOGLOBIN A1C
Est. average glucose Bld gHb Est-mCnc: 272 mg/dL
Hgb A1c MFr Bld: 11.1 % — ABNORMAL HIGH (ref 4.8–5.6)

## 2024-04-23 LAB — COMPREHENSIVE METABOLIC PANEL WITH GFR
ALT: 15 IU/L (ref 0–32)
AST: 13 IU/L (ref 0–40)
Albumin: 4.2 g/dL (ref 3.8–4.9)
Alkaline Phosphatase: 76 IU/L (ref 44–121)
BUN/Creatinine Ratio: 24 — ABNORMAL HIGH (ref 9–23)
BUN: 16 mg/dL (ref 6–24)
Bilirubin Total: 0.8 mg/dL (ref 0.0–1.2)
CO2: 26 mmol/L (ref 20–29)
Calcium: 8.7 mg/dL (ref 8.7–10.2)
Chloride: 92 mmol/L — ABNORMAL LOW (ref 96–106)
Creatinine, Ser: 0.68 mg/dL (ref 0.57–1.00)
Globulin, Total: 2.7 g/dL (ref 1.5–4.5)
Glucose: 347 mg/dL — ABNORMAL HIGH (ref 70–99)
Potassium: 5.4 mmol/L — ABNORMAL HIGH (ref 3.5–5.2)
Sodium: 133 mmol/L — ABNORMAL LOW (ref 134–144)
Total Protein: 6.9 g/dL (ref 6.0–8.5)
eGFR: 102 mL/min/{1.73_m2} (ref >59)

## 2024-04-23 LAB — MICROALBUMIN / CREATININE URINE RATIO
Creatinine, Urine: 62.8 mg/dL
Microalb/Creat Ratio: 29 mg/g{creat} (ref 0–29)
Microalbumin, Urine: 17.9 ug/mL

## 2024-04-23 LAB — TSH RFX ON ABNORMAL TO FREE T4: TSH: 53 u[IU]/mL — ABNORMAL HIGH (ref 0.450–4.500)

## 2024-04-23 LAB — T4F: T4,Free (Direct): 0.93 ng/dL (ref 0.82–1.77)

## 2024-04-23 LAB — VITAMIN D 25 HYDROXY (VIT D DEFICIENCY, FRACTURES): Vit D, 25-Hydroxy: 13.8 ng/mL — ABNORMAL LOW (ref 30.0–100.0)

## 2024-04-24 ENCOUNTER — Ambulatory Visit: Payer: BC Managed Care – PPO | Admitting: Physical Therapy

## 2024-04-25 ENCOUNTER — Telehealth: Payer: Self-pay

## 2024-04-25 ENCOUNTER — Other Ambulatory Visit (HOSPITAL_COMMUNITY): Payer: Self-pay

## 2024-04-25 NOTE — Progress Notes (Signed)
 Care Guide Pharmacy Note  04/25/2024 Name: Carlette Dark MRN: [id number] DOB: 10-09-1966  Referred By: Carlean Charter, DO Reason for referral: Complex Care Management (Outreach to schedule with Pharm d )   Kamee Winterstein is a 58 y.o. year old female who is a primary care patient of Pardue, Asencion Blacksmith, DO.  Danon Parkison was referred to the pharmacist for assistance related to: DMII  Successful contact was made with the patient to discuss pharmacy services including being ready for the pharmacist to call at least 5 minutes before the scheduled appointment time and to have medication bottles and any blood pressure readings ready for review. The patient agreed to meet with the pharmacist via telephone visit on (date/time).05/02/2024  Lenton Rail , RMA     [place]  [facility], [facility] Guide  Direct Dial : [phone]  Website: [email or web address]

## 2024-04-26 ENCOUNTER — Ambulatory Visit: Payer: BC Managed Care – PPO | Attending: Orthopedic Surgery | Admitting: Physical Therapy

## 2024-04-26 ENCOUNTER — Encounter: Payer: Self-pay | Admitting: Physical Therapy

## 2024-04-26 DIAGNOSIS — R2689 Other abnormalities of gait and mobility: Secondary | ICD-10-CM | POA: Diagnosis not present

## 2024-04-26 DIAGNOSIS — M5459 Other low back pain: Secondary | ICD-10-CM | POA: Insufficient documentation

## 2024-04-26 DIAGNOSIS — R262 Difficulty in walking, not elsewhere classified: Secondary | ICD-10-CM | POA: Insufficient documentation

## 2024-04-26 DIAGNOSIS — M6281 Muscle weakness (generalized): Secondary | ICD-10-CM | POA: Diagnosis not present

## 2024-04-26 DIAGNOSIS — R2681 Unsteadiness on feet: Secondary | ICD-10-CM | POA: Diagnosis not present

## 2024-04-26 NOTE — Therapy (Unsigned)
 OUTPATIENT PHYSICAL THERAPY NEURO treatment      Patient Name: Gabriella Marsh MRN: [id number] DOB:[birth year], 58 y.o., female Today's Date: 04/26/2024   PCP: Carlean Charter, DO REFERRING PROVIDER: Seena Dadds, MD  END OF SESSION:  PT End of Session - 04/26/24 1631     Visit Number 14    Number of Visits 25    Date for PT Re-Evaluation 05/31/24    Progress Note Due on Visit 20    PT Start Time 1623    PT Stop Time 1702    PT Time Calculation (min) 39 min    Equipment Utilized During Treatment Gait belt    Activity Tolerance Patient tolerated treatment well    Behavior During Therapy WFL for tasks assessed/performed              Past Medical History:  Diagnosis Date   Arthritis    knees   Blood clotting disorder (HCC)    on toe    Degenerative disc disease, lumbar    Diabetes mellitus without complication (HCC)    GERD (gastroesophageal reflux disease)    Headache    daily - AM (has not been able to have SPG blocks lately)   Hyperlipidemia    Hypertension    Neuropathy    feet   Vertigo    3-4x/yr   Past Surgical History:  Procedure Laterality Date   ABDOMINAL HYSTERECTOMY     But still has cervix   AMPUTATION TOE Right 07/24/2021   Procedure: AMPUTATION TOE;  Surgeon: Angel Barba, DPM;  Location: [facility] ORS;  Service: Podiatry;  Laterality: Right;   CESAREAN SECTION     CHOLECYSTECTOMY     COLONOSCOPY WITH PROPOFOL  N/A 02/20/2022   Procedure: COLONOSCOPY WITH PROPOFOL ;  Surgeon: Luke Salaam, MD;  Location: [facility];  Service: Gastroenterology;  Laterality: N/A;   left arm frature  10/12/2020   OVARIAN CYST SURGERY     PARATHYROIDECTOMY  03/28/2021   Procedure: PARATHYROIDECTOMY AUTOTRANSPLANT;  Surgeon: Mercy Stall, MD;  Location: [facility] ORS;  Service: General;;   right femur surgery Right    SHOULDER SURGERY Left 12/26/014   Dr. Waymond Hailey, [facility]   THYROIDECTOMY N/A 03/28/2021   Procedure: THYROIDECTOMY, total;  Surgeon: Mercy Stall, MD;  Location: [facility] ORS;  Service: General;  Laterality: N/A;  Provider requesting 3 hours /180 minutes for procedure   TONSILLECTOMY AND ADENOIDECTOMY     UTERINE FIBROID SURGERY     Patient Active Problem List   Diagnosis Date Noted   Neuropathy 04/21/2024   Lymphedema 04/21/2024   Wheelchair dependence 03/10/2023   Hypertension associated with diabetes (HCC) 03/10/2023   AKI (acute kidney injury) (HCC) 01/07/2023   Neurogenic bladder 01/07/2023   FTT (failure to thrive) in adult 12/09/2022   Primary insomnia 09/10/2022   Impaired functional mobility, balance, gait, and endurance 06/09/2022   Type 2 diabetes mellitus with diabetic neuropathy, with long-term current use of insulin  (HCC) 04/24/2022   Hypothyroidism 04/24/2022   Hyperlipidemia associated with type 2 diabetes mellitus (HCC) 04/24/2022   Iron deficiency anemia 04/24/2022   Chronic sensorimotor polyneuropathy with axonal and demyelinating features 01/12/2022   Osteoarthritis of knees (Bilateral) 01/12/2022   Tricompartment osteoarthritis of knees (Bilateral) 01/12/2022    Grade 1 Retrolisthesis (9mm) of L5 over S1 01/12/2022   History of proximal humerus fracture (Left) 01/12/2022   Vitamin D  deficiency 01/12/2022   Chronic peripheral neuropathic pain 01/12/2022   Disorder of skeletal system 11/12/2021   Problems influencing health  status 11/12/2021   Chronic hand pain (1ry area of Pain) (Bilateral) (L>R) 11/12/2021   Chronic feet pain (3ry area of Pain) (Bilateral) (L>R) 11/12/2021   Chronic shoulder pain (4th area of Pain) (Left) 11/12/2021   Chronic knee pain (5th area of Pain) (Bilateral) (L>R) 11/12/2021   Lumbar facet syndrome (Bilateral) 11/12/2021   COVID-19 virus infection 07/22/2021   Chronic pain syndrome 07/17/2021   S/P total thyroidectomy 03/28/2021   Thyroid  nodule    Schamberg's purpura 10/09/2019   Common migraine with intractable migraine 06/18/2015   DDD (degenerative disc disease),  lumbosacral 05/03/2015   Insomnia due to medical condition 05/03/2015   Disorder of peripheral nervous system 05/03/2015   Diabetic peripheral neuropathy (HCC) 03/06/2015   Obesity 03/06/2015   GERD (gastroesophageal reflux disease) 03/06/2015   Multinodular goiter 03/06/2015   RLS (restless legs syndrome) 03/06/2015   Chronic low back pain (2ry area of Pain) (Bilateral) (L>R) w/o sciatica 03/06/2015    ONSET DATE: 06/18/22  REFERRING DIAG: S72.91XD (ICD-10-CM) - Unspecified fracture of right femur, subsequent encounter for closed fracture with routine healing   THERAPY DIAG:  Difficulty in walking, not elsewhere classified  Muscle weakness (generalized)  Other abnormalities of gait and mobility  Unsteadiness on feet  Other low back pain  Rationale for Evaluation and Treatment: Rehabilitation  SUBJECTIVE:                                                                                                                                                                                             SUBJECTIVE STATEMENT:   Pt reports that she is doing well. Reports that she is walking more at home and performing sit<>stand transfers independently form elevated recliner throughout the day. Was able to don Bil AFO on this day with reduced bil swelling.   Pt accompanied by:  Spouse   PERTINENT HISTORY: Pt is a pleasant 58 y/o female known to PT clinic, returning for impairments due to R femur fx sustained in 2023. Pt hx includes fall in June 2023 where pt suffered R femur fx followed by surgery where pt had plate and screws placed in R knee and rod in R femur. At the time pt with hx of multiple falls, now pt reports no falls in last 6 months. Since previous d/c from PT on 05/13/2023, pt has been consistently going to [facility] 1x/week, and now returns to [facility] for new year. Pt reports she ambulated with RW while at [facility] clinic, has been able to perform STS to RW from higher surfaces. She would  like to continue working on her mobility and strength, including both UE  and LE strength. She still struggles with putting on her shoes and socks and getting from her bed to the commode. She reports standing at [facility] also limited due to chronic low back pain. She reports she'll likely get injections to tx LBP in future. Pt still performing previous HEP given, typically she completes 1 set of each exercise for 25-30 reps. Says this is easy. PMH per chart significant for arthritis, blood clotting disorder, DDD (lumbar), DM, headache, HTN, neuropathy, vertigo   PAIN:  Are you having pain? Yes: NPRS scale: not numerically rated Pain location: low back, bilat hand pain "hurts a lot all the time"  Pain description:   Aggravating factors: standing limited due to LBP in position Relieving factors: wearing gloves helps with hand pain  PRECAUTIONS: Fall  RED FLAGS: None   WEIGHT BEARING RESTRICTIONS: No  FALLS: Has patient fallen in last 6 months? No  LIVING ENVIRONMENT: via chart, pt confirms the following is still correct Lives with: lives with their family and lives with their spouse Lives in: House/apartment Stairs: No Has following equipment at home: Environmental consultant - 2 wheeled, Wheelchair (manual), Shower bench, bed side commode, and Grab bars  PLOF: Needs assistance with ADLs  PATIENT GOALS: Get out from bed to walker to bedside commode with or without LE braces donned, improve strength in UE/LE  OBJECTIVE:  Note: Objective measures were completed at Evaluation unless otherwise noted.  DIAGNOSTIC FINDINGS:   Via chart [facility] care 05/13/2023 XR Femur 2 views right: "Impression  Unchanged position and alignment with further osseous bridging at the distal femur fracture following intramedullary rod fixation." And findings "FINDINGS:  Position and alignment of the comminuted distal femur fracture are unchanged after placement of a retrograde interlocking intramedullary rod. Proximal and distal  interlocking components articulate appropriately with the rod. There is progressive osseous bridging with remodeling at the fracture site, there is no new fracture or dislocation. "  COGNITION: Overall cognitive status: Within functional limits for tasks assessed   SENSATION: Reports impaired BLE, does have sensation on tops of thighs per report   COORDINATION: WFL UE rapid alt movement WFL LE with heel to shin, but unable to perform rapid alt movement due to weakness in bilat DFs  EDEMA:  Reports no swelling    POSTURE: rounded shoulders and forward head  LOWER EXTREMITY ROM:     Slight impairment with R ankle DF > L with PROM Slight impairment of L knee extension and somewhat painful    LOWER EXTREMITY MMT:    Grossly 4/5 in BLE exception is pt unable to complete DF bilat, other areas of significant mm deficit include hip flexors, abductors and hamstrings bilat  TRANSFERS:  Attempted STS to RW from WC during eval. Unable to perform from wheelchair even with max assist. Pt reports she has been able to complete STS from higher surfaces and this is how she practiced in [facility]  Assistive device utilized: Walker - 2 wheeled  Sit to stand:  unable from standard chair height/WC see above  FUNCTIONAL TESTS:  Attempted STS from [facility], unable at this time. Will attempt functional test like 30 sec STS in future as pt able  PATIENT SURVEYS:  LEFS 50/80 FOTO 54  TREATMENT DATE: 12/29/2023 Gait with AFO and KAFO x 47ft with CGA for safety. One near LOB due to LLE stuck on floot from knee extension lock out in KAFO. PT unlocked brace, and pt able to advance the LLE with knee and hip flexion to continue.  Sit<>stand x 10 + 8 with UE support on RW and arm rest. Figure 8 x 2 with therapeutic rest break between bouts. With RW and AFOs.  Seated LAQ x 12  Seated  hip flexion x 12 bil  Stand pivot transfer to and from [facility] with CGA/supervision assist from PT and heavy use arm rests.   Throughout session, pt required therapeutic rest breaks due to BLE fatigue and mild SOB upon completion of each intervention.   Education for decreased need of KAFO due to increased L knee strength. Pt reports that [facility].  Aaron Aas     PATIENT EDUCATION: Education details: assessment findings, goals, plan, HEP. WC planning HEP expansion  Pt educated throughout session about proper posture and technique with exercises. Improved exercise technique, movement at target joints, use of target muscles after min to mod verbal, visual, tactile cues.  Person educated: Patient and Spouse Education method: Explanation, Verbal cues, and Handouts Education comprehension: verbalized understanding and returned demonstration  HOME EXERCISE PROGRAM: Access Code: [email or web address] URL: [email or web address] Date: 12/29/2023 Prepared by: Aminta Kales  Exercises - Seated Hamstring Curls with Resistance  - 1 x daily - 5-6 x weekly - 3-4 sets - 20-25 reps - Seated Hip Abduction with Resistance  - 1 x daily - 5-6 x weekly - 3-4 sets - 20-25 reps - Seated March  - 1 x daily - 5-6 x weekly - 3-4 sets - 20-25 reps   Access Code: [email or web address] URL: [email or web address] Date: 01/26/2024 Prepared by: Aurora Lees  Exercises - Supine Heel Slide  - 1 x daily - 7 x weekly - 3 sets - 10 reps - Supine Straight Leg Raises  - 1 x daily - 7 x weekly - 3 sets - 10 reps - Hooklying Clamshell with Resistance  - 1 x daily - 7 x weekly - 3 sets - 10 reps - Supine Hip Flexion  - 1 x daily - 7 x weekly - 3 sets - 10 reps - Supine Single Leg Ankle Pumps  - 1 x daily - 7 x weekly - 3 sets - 10 reps - Supine Bridge  - 1 x daily - 7 x weekly - 3 sets - 10 reps GOALS: Goals reviewed with patient? Yes  SHORT TERM GOALS: Target date: 02/09/2024   Patient will be  independent in home exercise program to improve strength/mobility for better functional independence with ADLs. Baseline: initiated Goal status: INITIAL    LONG TERM GOALS: Target date: 03/22/2024   Patient will perform sit to stand with min-mod assist from standard chair height in order to indicate improved lower extremity functional strength and improved ability to perform functional transfers and functional leg activities within her home.  Baseline: mod assist with +2 for safety.  4/2: mod assist  Goal status: INITIAL  2.  Pt will improve LEFS by 10 points or more in order to indicate improved LE function and mobility.  Baseline: 50 4/2: 32 Goal status: INITIAL  3.  Patient will increase FOTO score to equal to or greater than  59   to demonstrate statistically significant improvement in mobility and quality of life.  Baseline: 54 Goal status: goal d/c'ed.   4.  Pt will require no more than min assist for transfer from bed to bedside commode Baseline: supervision assist with  slide board transfer. To mat table  Using walker for transfer to [facility] with mod assist for safety and  Goal status: progressing   5.  The pt will demo ability to don socks and shoes indep or mod I for increased ease with dressing and improved independence Baseline: required max assist from husband  4/2: pt states that she is donning night socks. Is still requiring max assist for LLE and moderate assist for the RLE  Goal status: Progressing  6.  The pt will demo ability to complete 30 second STS test to indicate increased ease with transfers and BLE strength Baseline: 2 sit<>stand from [facility] with cushion in place and  +2 for safety.  4/2: 3 with mod assist and +2 to stabilize RW  Goal status: progressing    ASSESSMENT:  CLINICAL IMPRESSION:  Patient is a pleasant 58 y.o. female who was seen today for physical therapy treatment for impairments related to R femur fx sustained in 2023. Pt continues to  demonstrate improved balance, and BLE strength with increased ROM in all muscle groups. Tolerated dynamic gait training with RW to perform figure 8 and significantly reduced reliance on UE support for ambulation. Improved knee strength is allowing LLE advancement and no buckling with decreased use of KAFO; may benefit from removal of thigh support on AFO.   The pt will benefit from further skilled PT to improve these impairments in order to increase ease with ADLs, QOL and to reduce fall risk.   OBJECTIVE IMPAIRMENTS: Abnormal gait, decreased activity tolerance, decreased balance, decreased coordination, decreased endurance, decreased mobility, difficulty walking, decreased ROM, decreased strength, hypomobility, impaired sensation, improper body mechanics, postural dysfunction, and pain.   ACTIVITY LIMITATIONS: carrying, lifting, bending, standing, squatting, stairs, transfers, bed mobility, bathing, toileting, dressing, and locomotion level  PARTICIPATION LIMITATIONS: meal prep, cleaning, laundry, driving, shopping, community activity, and yard work  PERSONAL FACTORS: Fitness, Sex, Time since onset of injury/illness/exacerbation, and 3+ comorbidities: PMH per chart significant for arthritis, blood clotting disorder, DDD (lumbar), DM, headache, HTN, neuropathy, vertigo  are also affecting patient's functional outcome.   REHAB POTENTIAL: Good  CLINICAL DECISION MAKING: Evolving/moderate complexity  EVALUATION COMPLEXITY: Moderate  PLAN:  PT FREQUENCY: 1-2x/week  PT DURATION: 12 weeks  PLANNED INTERVENTIONS: 97164- PT Re-evaluation, 97110-Therapeutic exercises, 97530- Therapeutic activity, [id number]- Neuromuscular re-education, 97535- Self Care, 16109- Manual therapy, [id number]- Gait training, [phone]- Orthotic Fit/training, [phone]- Canalith repositioning, [id number]- Electrical stimulation (manual), [id number]- Ultrasound, Patient/Family education, Balance training, Stair training, Taping, Dry Needling, Joint  mobilization, Spinal mobilization, Vestibular training, DME instructions, Wheelchair mobility training, Cryotherapy, and Moist heat  PLAN FOR NEXT SESSION:   Continue BLE strengthening.  Gait as tolerated with bracing as appropriate.   Barbara Book, PT 04/26/2024, 4:40 PM

## 2024-04-27 ENCOUNTER — Other Ambulatory Visit (HOSPITAL_COMMUNITY): Payer: Self-pay

## 2024-04-27 ENCOUNTER — Telehealth: Payer: Self-pay

## 2024-04-27 NOTE — Telephone Encounter (Signed)
 Pharmacy Patient Advocate Encounter  Received notification from [employer] that Prior Authorization for Ozempic  (1 MG/DOSE) 4MG /3ML pen-injectors has been APPROVED from 04/27/24 to 04/26/25. Ran test claim, Copay is $0. This test claim was processed through [facility]- copay amounts may vary at other pharmacies due to pharmacy/plan contracts, or as the patient moves through the different stages of their insurance plan.   PA #/Case ID/Reference #: [id number]

## 2024-04-27 NOTE — Telephone Encounter (Signed)
 Upon talking to the pharmacy, this patient doesn't have a Ozempic  1mg  RX on file. Could you please send that over to the [facility] on [address] in [place]? Thanks!

## 2024-04-28 ENCOUNTER — Encounter: Payer: Self-pay | Admitting: Family Medicine

## 2024-04-28 NOTE — Addendum Note (Signed)
 Addended by: Judyann Number on: 04/28/2024 12:41 PM   Modules accepted: Orders

## 2024-05-01 ENCOUNTER — Ambulatory Visit: Payer: BC Managed Care – PPO | Admitting: Physical Therapy

## 2024-05-02 ENCOUNTER — Other Ambulatory Visit: Payer: Self-pay | Admitting: Pharmacist

## 2024-05-02 ENCOUNTER — Ambulatory Visit: Admitting: Physical Therapy

## 2024-05-02 DIAGNOSIS — E1140 Type 2 diabetes mellitus with diabetic neuropathy, unspecified: Secondary | ICD-10-CM

## 2024-05-02 MED ORDER — ACCU-CHEK GUIDE TEST VI STRP: ORAL_STRIP | 11 refills | Status: AC

## 2024-05-02 NOTE — Progress Notes (Unsigned)
 05/02/2024 Name: Gabriella Marsh MRN: [id number] DOB: [birth year]  Chief Complaint  Patient presents with   Diabetes    Gabriella Marsh is a 58 y.o. year old female who presented for a telephone visit.   They were referred to the pharmacist by their PCP for assistance in managing diabetes.   Referral for DM control  ER 3 weeks ago for leg swelling and high BG's- found out Dexcom was not working  Subjective:  Care Team: Primary Care Provider: Carlean Charter, DO ; Next Scheduled Visit: 05/22/24 Clinical Pharmacist: Delvin File, PharmD  Medication Access/Adherence  Current Pharmacy:  [facility] [phone] - [place], [place] - [address] AT [facility] [address] [place] [address] [place] [address] Phone: [phone] Fax: [phone]  [facility] - [place], [place] - [address] [place] [address] Phone: [phone] Fax: [phone]   Patient reports affordability concerns with their medications: No  Patient reports access/transportation concerns to their pharmacy: No  Patient reports adherence concerns with their medications:  No     Diabetes:  Current medications: Ozempic  0.25mg  weekly, Lantus  24U at bedtime, Novolog  sliding scale (see below), Metformin  1000mg  twice a day (max dose) Medications tried in the past: Glipizide  (stopped insulin ), Trulicity  1.5mg  weekly (2023 due to shortage)   Using Accu-chek meter; testing 0 times daily (needs to find) *Currently uses Dexcom G7     Patient denies hypoglycemic s/sx including dizziness, shakiness, sweating. Patient denies hyperglycemic symptoms including polyuria, polydipsia, polyphagia, nocturia, neuropathy, blurred vision.  Current meal patterns:  - Breakfast (5:30AM): Bowl of cheerios + cup of coffee  - Lunch (12-1PM): Mrs. [name] frozen foods prior, leftover now - Supper (5-6:30PM): Hot dogs, crockpot potato soup, eat out a good  bit; goal of baked chicken + Mrs. Dash seasoning, rice or mac'n'cheese weekly - Snacks: Half an orange or banana - Drinks: Water, Splenda tea (half large tumbler daily) *Husband doesn't know how to cook- mostly microwavable items  Current physical activity: Can't walk- uses wheelchair  Current medication access support: [employer]   Objective:  Lab Results  Component Value Date   HGBA1C 11.1 (H) 04/21/2024    Lab Results  Component Value Date   CREATININE 0.68 04/21/2024   BUN 16 04/21/2024   NA 133 (L) 04/21/2024   K 5.4 (H) 04/21/2024   CL 92 (L) 04/21/2024   CO2 26 04/21/2024    Lab Results  Component Value Date   CHOL 166 04/21/2024   HDL 36 (L) 04/21/2024   LDLCALC 70 04/21/2024   TRIG 377 (H) 04/21/2024   CHOLHDL 4.6 (H) 04/21/2024    Medications Reviewed Today   Medications were not reviewed in this encounter       Assessment/Plan:   Diabetes: - Currently uncontrolled - Reviewed long term cardiovascular and renal outcomes of uncontrolled blood sugar - Reviewed goal A1c, goal fasting, and goal 2 hour post prandial glucose - Patient denies personal or family history of multiple endocrine neoplasia type 2, medullary thyroid  cancer; personal history of pancreatitis or gallbladder disease. - Recommend to check glucose continuously with Dexcom G7  Sliding scale from sister-in-law: 150-200= 2U 201-254= 4U 251-300= 6U 301-350= 8U 351-400= 10U   Follow Up Plan:  - Follow-up on 05/10/24 for insulin  titration (will need new script once decided dose) + Dexcom review - Send Rx for strips to [facility]- believes it is Accu-chek for emergencies -  Switch back to Trulicity  from Ozempic - send once PA approved  - Having diarrhea with any Ozempic  doses higher than 0.25mg  weekly  - Agreed to doing Trulicity  again (was up to 1.5mg  weekly prior); did well and only switched due to backorder issues - INCREASE Lantus  to 30U nightly - Technically 55U daily of Lantus   recommended before adding more sliding scale - Will need [facility] for mail order in the future if continuing Trulicity  (husband currently takes this as well) - Continue sliding scale Novolog  for now   Delvin File, PharmD [facility]  Phone Number: [phone]

## 2024-05-03 ENCOUNTER — Encounter: Payer: Self-pay | Admitting: Family Medicine

## 2024-05-03 ENCOUNTER — Ambulatory Visit: Payer: BC Managed Care – PPO | Admitting: Physical Therapy

## 2024-05-03 MED ORDER — TRULICITY 0.75 MG/0.5ML ~~LOC~~ SOAJ: 0.7500 mg | SUBCUTANEOUS | 0 refills | Status: DC

## 2024-05-04 MED ORDER — INSULIN ASPART 100 UNIT/ML FLEXPEN: PEN_INJECTOR | SUBCUTANEOUS | 2 refills | Status: DC

## 2024-05-04 NOTE — Addendum Note (Signed)
 Addended by: Syla Devoss M on: 05/04/2024 04:21 PM   Modules accepted: Orders

## 2024-05-05 ENCOUNTER — Other Ambulatory Visit: Payer: Self-pay | Admitting: Family Medicine

## 2024-05-05 ENCOUNTER — Ambulatory Visit: Payer: Self-pay | Admitting: Family Medicine

## 2024-05-05 DIAGNOSIS — E559 Vitamin D deficiency, unspecified: Secondary | ICD-10-CM

## 2024-05-05 MED ORDER — VITAMIN D (ERGOCALCIFEROL) 1.25 MG (50000 UNIT) PO CAPS: 50000.0000 [IU] | ORAL_CAPSULE | ORAL | 1 refills | Status: DC

## 2024-05-05 NOTE — Progress Notes (Unsigned)
 er

## 2024-05-08 ENCOUNTER — Ambulatory Visit: Payer: BC Managed Care – PPO | Admitting: Physical Therapy

## 2024-05-10 ENCOUNTER — Ambulatory Visit: Payer: BC Managed Care – PPO | Admitting: Physical Therapy

## 2024-05-10 ENCOUNTER — Other Ambulatory Visit: Payer: Self-pay | Admitting: Pharmacist

## 2024-05-10 NOTE — Progress Notes (Signed)
 05/10/2024 Name: Gabriella Marsh MRN: [id number] DOB: 03-03-1966  Chief Complaint  Patient presents with   Diabetes    Gabriella Marsh is a 58 y.o. year old female who presented for a telephone visit.   They were referred to the pharmacist by their PCP for assistance in managing diabetes.   Referral for DM control  ER 3 weeks ago for leg swelling and high BG's- found out Dexcom was not working  Subjective:  Care Team: Primary Care Provider: Carlean Charter, DO ; Next Scheduled Visit: 05/22/24 Clinical Pharmacist: Gabriella Marsh, PharmD  Medication Access/Adherence  Current Pharmacy:  [facility] [phone] - [place] Gall[place], [place] - [address] AT [facility] [address] [place] [address] [place] [address] Phone: [phone] Fax: [phone]  [facility] - [place], [place] - [address] [place] [address] Phone: [phone] Fax: [phone]   Patient reports affordability concerns with their medications: No  Patient reports access/transportation concerns to their pharmacy: No  Patient reports adherence concerns with their medications:  No     Diabetes:  Current medications: Trulicity  0.75mg  weekly, Lantus  30U at bedtime, Novolog  sliding scale (see below), Metformin  1000mg  twice a day (max dose) Medications tried in the past: Glipizide  (stopped insulin ), Trulicity  1.5mg  weekly (2023 due to shortage)   Using Accu-chek meter; testing 0 times daily (needs to find) *Currently uses Dexcom G7     Patient denies hypoglycemic s/sx including dizziness, shakiness, sweating. Patient denies hyperglycemic symptoms including polyuria, polydipsia, polyphagia, nocturia, neuropathy, blurred vision.  Current meal patterns:  - Breakfast (5:30AM): Bowl of cheerios + cup of coffee  - Lunch (12-1PM): Mrs. Gabriella Marsh frozen foods prior, leftover now - Supper (5-6:30PM): Hot dogs, crockpot potato soup, eat out a good  bit; goal of baked chicken + Mrs. Dash seasoning, rice or mac'n'cheese weekly - Snacks: Half an orange or banana - Drinks: Water, Splenda tea (half large tumbler daily) *Husband doesn't know how to cook- mostly microwavable items  Current physical activity: Can't walk- uses wheelchair  Current medication access support: [employer] Commercial   Objective:  Lab Results  Component Value Date   HGBA1C 11.1 (H) 04/21/2024    Lab Results  Component Value Date   CREATININE 0.68 04/21/2024   BUN 16 04/21/2024   NA 133 (L) 04/21/2024   K 5.4 (H) 04/21/2024   CL 92 (L) 04/21/2024   CO2 26 04/21/2024    Lab Results  Component Value Date   CHOL 166 04/21/2024   HDL 36 (L) 04/21/2024   LDLCALC 70 04/21/2024   TRIG 377 (H) 04/21/2024   CHOLHDL 4.6 (H) 04/21/2024    Medications Reviewed Today   Medications were not reviewed in this encounter       Assessment/Plan:   Diabetes: - Currently uncontrolled - Reviewed long term cardiovascular and renal outcomes of uncontrolled blood sugar - Reviewed goal A1c, goal fasting, and goal 2 hour post prandial glucose - Patient denies personal or family history of multiple endocrine neoplasia type 2, medullary thyroid  cancer; personal history of pancreatitis or gallbladder disease. - Recommend to check glucose continuously with Dexcom G7  Sliding scale from sister-in-law: 150-200= 2U 201-254= 4U 251-300= 6U 301-350= 8U 351-400= 10U   Follow Up Plan:  - Follow-up on 05/16/24 for insulin  titration + Dexcom review - Send Rx for strips to [facility]- believes it is Accu-chek for emergencies - Doing well with Trulicity  0.75mg  (send new  script on 6/9 for dose increase to [facility])  - Having diarrhea with any Ozempic  doses higher than 0.25mg  weekly  - Agreed to doing Trulicity  again (was up to 1.5mg  weekly prior); did well and only switched due to backorder issues - INCREASE Lantus  to 40U nightly- 15 days left (send updated script at next  call) - Technically 55U daily of Lantus  recommended before adding more sliding scale - Will need [facility] for mail order in the future if continuing Trulicity  (husband currently takes this as well) - Continue sliding scale Novolog  for now- script okay for now - Working on getting therapy set back up - Believe having sensor issues currently since 9AM today- will replace and double check with fingerstick later if needed; aware to contact manufacturer for replacements   Gabriella Marsh, PharmD [name]  Phone Number: [phone]

## 2024-05-11 ENCOUNTER — Telehealth: Payer: Self-pay | Admitting: Family Medicine

## 2024-05-11 NOTE — Telephone Encounter (Signed)
 Received FMLA forms and placed in Dr. Louanne Roussel box

## 2024-05-11 NOTE — Telephone Encounter (Signed)
 Patient was informed that FMLA paperwork was completed & faxed. A copy will be left up front

## 2024-05-12 NOTE — Telephone Encounter (Signed)
 Did this yesterday. Spoke with patient. Forms placed in folder to be faxed.

## 2024-05-16 ENCOUNTER — Other Ambulatory Visit: Payer: Self-pay | Admitting: Pharmacist

## 2024-05-16 DIAGNOSIS — E1140 Type 2 diabetes mellitus with diabetic neuropathy, unspecified: Secondary | ICD-10-CM

## 2024-05-16 DIAGNOSIS — E1165 Type 2 diabetes mellitus with hyperglycemia: Secondary | ICD-10-CM

## 2024-05-16 MED ORDER — LANTUS SOLOSTAR 100 UNIT/ML ~~LOC~~ SOPN: PEN_INJECTOR | SUBCUTANEOUS | 1 refills | Status: DC

## 2024-05-16 MED ORDER — INSULIN PEN NEEDLE 30G X 5 MM MISC: 5 refills | Status: DC

## 2024-05-16 NOTE — Progress Notes (Signed)
 05/16/2024 Name: Gabriella Marsh MRN: [id number] DOB: 02-13-1966  Chief Complaint  Patient presents with   Diabetes    Gabriella Marsh is a 58 y.o. year old female who presented for a telephone visit.   They were referred to the pharmacist by their PCP for assistance in managing diabetes.   Referral for DM control  ER 3 weeks ago for leg swelling and high BG's- found out Dexcom was not working  Subjective:  Care Team: Primary Care Provider: Carlean Charter, DO ; Next Scheduled Visit: 05/22/24 Clinical Pharmacist: Delvin File, PharmD  Medication Access/Adherence  Current Pharmacy:  [facility] [phone] - [place] Gall[place], [place] - [address] AT [facility] [address] [place] [address] [place] [address] Phone: [phone] Fax: [phone]  [facility] - [place], [place] - [address] [place] [address] Phone: [phone] Fax: [phone]   Patient reports affordability concerns with their medications: No  Patient reports access/transportation concerns to their pharmacy: No  Patient reports adherence concerns with their medications:  No     Diabetes:  Current medications: Trulicity  0.75mg  weekly, Lantus  40U at bedtime, Novolog  sliding scale (see below), Metformin  1000mg  twice a day (max dose) Medications tried in the past: Glipizide  (stopped insulin ), Trulicity  1.5mg  weekly (2023 due to shortage)   Using Accu-chek meter; testing 0 times daily (needs to find) *Currently uses Dexcom G7  5/27 visit:   5/13 visit:    Patient denies hypoglycemic s/sx including dizziness, shakiness, sweating. Patient denies hyperglycemic symptoms including polyuria, polydipsia, polyphagia, nocturia, neuropathy, blurred vision.  Current meal patterns:  - Breakfast (5:30AM): Bowl of cheerios + cup of coffee  - Lunch (12-1PM): Mrs. Gabriella Marsh frozen foods prior, leftover now - Supper (5-6:30PM): Hot dogs,  crockpot potato soup, eat out a good bit; goal of baked chicken + Mrs. Dash seasoning, rice or mac'n'cheese weekly - Snacks: Half an orange or banana - Drinks: Water, Splenda tea (half large tumbler daily) *Husband doesn't know how to cook- mostly microwavable items  Current physical activity: Can't walk- uses wheelchair  Current medication access support: [employer] Commercial   Objective:  Lab Results  Component Value Date   HGBA1C 11.1 (H) 04/21/2024    Lab Results  Component Value Date   CREATININE 0.68 04/21/2024   BUN 16 04/21/2024   NA 133 (L) 04/21/2024   K 5.4 (H) 04/21/2024   CL 92 (L) 04/21/2024   CO2 26 04/21/2024    Lab Results  Component Value Date   CHOL 166 04/21/2024   HDL 36 (L) 04/21/2024   LDLCALC 70 04/21/2024   TRIG 377 (H) 04/21/2024   CHOLHDL 4.6 (H) 04/21/2024    Medications Reviewed Today   Medications were not reviewed in this encounter       Assessment/Plan:   Diabetes: - Currently uncontrolled - Reviewed long term cardiovascular and renal outcomes of uncontrolled blood sugar - Reviewed goal A1c, goal fasting, and goal 2 hour post prandial glucose - Patient denies personal or family history of multiple endocrine neoplasia type 2, medullary thyroid  cancer; personal history of pancreatitis or gallbladder disease. - Recommend to check glucose continuously with Dexcom G7  Sliding scale from sister-in-law: 150-200= 2U 201-254= 4U 251-300= 6U 301-350= 8U 351-400= 10U   Follow Up Plan:  - Follow-up on 05/19/24 for insulin  titration + Dexcom review - Doing well with Trulicity  0.75mg  (send new script on 6/9 for dose increase to  [facility])  - Having diarrhea with any Ozempic  doses higher than 0.25mg  weekly  - Agreed to doing Trulicity  again (was up to 1.5mg  weekly prior); did well and only switched due to backorder issues - INCREASE Lantus  to 45U nightly- new script sent today - Technically 55U daily of Lantus  recommended before  adding more sliding scale - Will need [facility] for mail order in the future if continuing Trulicity  (husband currently takes this as well) - Continue sliding scale Novolog  for now- script okay for now - Working on getting therapy set back up - Did not taking any insulin  or have a Dexcom over the weekend- forgot it while going away   Delvin File, PharmD [facility]  Phone Number: [phone]

## 2024-05-18 DIAGNOSIS — E1165 Type 2 diabetes mellitus with hyperglycemia: Secondary | ICD-10-CM | POA: Diagnosis not present

## 2024-05-19 ENCOUNTER — Other Ambulatory Visit: Payer: Self-pay | Admitting: Pharmacist

## 2024-05-19 NOTE — Progress Notes (Signed)
 05/19/2024 Name: Gabriella Marsh MRN: [id number] DOB: 03/09/1966  Chief Complaint  Patient presents with   Diabetes    Gabriella Marsh is a 58 y.o. year old female who presented for a telephone visit.   They were referred to the pharmacist by their PCP for assistance in managing diabetes.   Referral for DM control  ER 3 weeks ago for leg swelling and high BG's- found out Dexcom was not working  Subjective:  Care Team: Primary Care Provider: Carlean Charter, DO ; Next Scheduled Visit: 05/22/24 Clinical Pharmacist: Delvin File, PharmD  Medication Access/Adherence  Current Pharmacy:  [facility] [phone] - [place] Gall[place],  - [address] AT [facility] [address] [place] [address] [place] [address] Phone: [phone] Fax: [phone]  [facility] - [place], [place] - [address] [place] [address] Phone: [phone] Fax: [phone]   Patient reports affordability concerns with their medications: No  Patient reports access/transportation concerns to their pharmacy: No  Patient reports adherence concerns with their medications:  No     Diabetes:  Current medications: Trulicity  0.75mg  weekly, Lantus  45U at bedtime, Novolog  sliding scale (see below), Metformin  1000mg  twice a day (max dose) Medications tried in the past: Glipizide  (stopped insulin ), Trulicity  1.5mg  weekly (2023 due to shortage)   Using Accu-chek meter; testing 0 times daily (needs to find) *Currently uses Dexcom G7  05/19/24 visit:    5/27 visit:   5/13 visit:    Patient denies hypoglycemic s/sx including dizziness, shakiness, sweating. Patient denies hyperglycemic symptoms including polyuria, polydipsia, polyphagia, nocturia, neuropathy, blurred vision.  Current meal patterns:  - Breakfast (5:30AM): Bowl of cheerios + cup of coffee  - Lunch (12-1PM): Mrs. [name] frozen foods prior, leftover now - Supper  (5-6:30PM): Hot dogs, crockpot potato soup, eat out a good bit; goal of baked chicken + Mrs. Dash seasoning, rice or mac'n'cheese weekly - Snacks: Half an orange or banana - Drinks: Water, Splenda tea (half large tumbler daily) *Husband doesn't know how to cook- mostly microwavable items  Current physical activity: Can't walk- uses wheelchair  Current medication access support: [employer] Commercial   Objective:  Lab Results  Component Value Date   HGBA1C 11.1 (H) 04/21/2024    Lab Results  Component Value Date   CREATININE 0.68 04/21/2024   BUN 16 04/21/2024   NA 133 (L) 04/21/2024   K 5.4 (H) 04/21/2024   CL 92 (L) 04/21/2024   CO2 26 04/21/2024    Lab Results  Component Value Date   CHOL 166 04/21/2024   HDL 36 (L) 04/21/2024   LDLCALC 70 04/21/2024   TRIG 377 (H) 04/21/2024   CHOLHDL 4.6 (H) 04/21/2024    Medications Reviewed Today   Medications were not reviewed in this encounter       Assessment/Plan:   Diabetes: - Currently uncontrolled - Reviewed long term cardiovascular and renal outcomes of uncontrolled blood sugar - Reviewed goal A1c, goal fasting, and goal 2 hour post prandial glucose - Patient denies personal or family history of multiple endocrine neoplasia type 2, medullary thyroid  cancer; personal history of pancreatitis or gallbladder disease. - Recommend to check glucose continuously with Dexcom G7  Sliding scale from sister-in-law: 150-200= 2U 201-254= 4U 251-300= 6U 301-350= 8U 351-400= 10U   Follow Up Plan:  - Follow-up on 05/24/24 for insulin  titration + Dexcom review - Doing well with Trulicity  0.75mg  (send new script on  6/4 visit for dose increase to [facility])  - Having diarrhea with any Ozempic  doses higher than 0.25mg  weekly  - Agreed to doing Trulicity  again (was up to 1.5mg  weekly prior); did well and only switched due to backorder issues - INCREASE Lantus  to 52U nightly - Technically 55U daily of Lantus  recommended before  adding more sliding scale - Will need [facility] for mail order in the future if continuing Trulicity  (husband currently takes this as well) - Continue sliding scale Novolog  for now- script okay for now - Working on getting therapy set back up - Did not taking any insulin  or have a Dexcom over the weekend- forgot it while going away   Delvin File, PharmD [facility]  Phone Number: [phone]

## 2024-05-22 ENCOUNTER — Ambulatory Visit: Admitting: Family Medicine

## 2024-05-24 ENCOUNTER — Other Ambulatory Visit: Payer: Self-pay | Admitting: Pharmacist

## 2024-05-24 DIAGNOSIS — E1140 Type 2 diabetes mellitus with diabetic neuropathy, unspecified: Secondary | ICD-10-CM

## 2024-05-24 MED ORDER — TRULICITY 1.5 MG/0.5ML ~~LOC~~ SOAJ: 1.5000 mg | SUBCUTANEOUS | 0 refills | Status: DC

## 2024-05-24 NOTE — Progress Notes (Addendum)
 05/24/2024 Name: Gabriella Marsh MRN: [id number] DOB: [birth year]  Chief Complaint  Patient presents with   Diabetes    Gabriella Marsh is a 58 y.o. year old female who presented for a telephone visit.   They were referred to the pharmacist by their PCP for assistance in managing diabetes.   Referral for DM control  ER 3 weeks ago for leg swelling and high BG's- found out Dexcom was not working  Subjective:  Care Team: Primary Care Provider: Carlean Charter, DO ; Next Scheduled Visit: 05/22/24 Clinical Pharmacist: Delvin File, PharmD  Medication Access/Adherence  Current Pharmacy:  [facility] [phone] - [place] Gall[place], [place] - [address] AT [facility] [address] [place] [address] [place] [address] Phone: [phone] Fax: [phone]  [facility] - [place], [place] - [address] [place] [address] Phone: [phone] Fax: [phone]   Patient reports affordability concerns with their medications: No  Patient reports access/transportation concerns to their pharmacy: No  Patient reports adherence concerns with their medications:  No     Diabetes:  Current medications: Trulicity  0.75mg  weekly, Lantus  52U at bedtime, Novolog  sliding scale (see below), Metformin  1000mg  twice a day (max dose) Medications tried in the past: Glipizide  (stopped insulin ), Trulicity  1.5mg  weekly (2023 due to shortage)   Using Accu-chek meter; testing 0 times daily (needs to find) *Currently uses Dexcom G7  05/24/24 visit:   05/19/24 visit:    5/27 visit:   5/13 visit:    Patient denies hypoglycemic s/sx including dizziness, shakiness, sweating. Patient denies hyperglycemic symptoms including polyuria, polydipsia, polyphagia, nocturia, neuropathy, blurred vision.  Current meal patterns:  - Breakfast (5:30AM): Bowl of cheerios + cup of coffee  - Lunch (12-1PM): Mrs. [name] frozen foods prior, leftover  now - Supper (5-6:30PM): Hot dogs, crockpot potato soup, eat out a good bit; goal of baked chicken + Mrs. Dash seasoning, rice or mac'n'cheese weekly - Snacks: Half an orange or banana - Drinks: Water, Splenda tea (half large tumbler daily) *Husband doesn't know how to cook- mostly microwavable items  Current physical activity: Can't walk- uses wheelchair  Current medication access support: [employer] Commercial   Objective:  Lab Results  Component Value Date   HGBA1C 11.1 (H) 04/21/2024    Lab Results  Component Value Date   CREATININE 0.68 04/21/2024   BUN 16 04/21/2024   NA 133 (L) 04/21/2024   K 5.4 (H) 04/21/2024   CL 92 (L) 04/21/2024   CO2 26 04/21/2024    Lab Results  Component Value Date   CHOL 166 04/21/2024   HDL 36 (L) 04/21/2024   LDLCALC 70 04/21/2024   TRIG 377 (H) 04/21/2024   CHOLHDL 4.6 (H) 04/21/2024    Medications Reviewed Today   Medications were not reviewed in this encounter       Assessment/Plan:   Diabetes: - Currently uncontrolled - Reviewed long term cardiovascular and renal outcomes of uncontrolled blood sugar - Reviewed goal A1c, goal fasting, and goal 2 hour post prandial glucose - Patient denies personal or family history of multiple endocrine neoplasia type 2, medullary thyroid  cancer; personal history of pancreatitis or gallbladder disease. - Recommend to check glucose continuously with Dexcom G7  Sliding scale from sister-in-law: 150-200= 2U 201-254= 4U 251-300= 6U 301-350= 8U 351-400= 10U   Follow Up Plan:  - Follow-up on 05/24/24 for insulin  titration + Dexcom review - INCREASE Trulicity  to 1.5mg  weekly-  Rx sent to [facility]  - Had diarrhea with any Ozempic  doses higher than 0.25mg  weekly  - Agreed to doing Trulicity  again (was up to 1.5mg  weekly prior); did well and only switched due to backorder issues - INCREASE Lantus  to 55U nightly - Will need [facility] for mail order in the future if continuing Trulicity   (husband currently takes this as well) - MODIFY Novolog  to 6U with each meal (instead of sliding scale) - Counseled on how to treat for hypoglycemia with simples sugars (box of raisins, few pieces of candy, 4 oz of juice/milk/soda) and wait 15 minutes to see result- confirmed understanding  *Of note, reports insurance only covers Semglee  and not Lantus - will need to call the pharmacy to get this fixed  Update from 6/5: Called pharmacy and will need Semglee  script sent to change it. Patient was using a Lantus  coupon card and based on amount needed, cost would be very high  Update from 6/9: Called pharmacy and Semglee  script is being worked on. No charge- just needs to be filled or for order to come in shipment either today or someday soon   Delvin File, PharmD [facility]  Phone Number: [phone]

## 2024-05-25 MED ORDER — INSULIN GLARGINE-YFGN 100 UNIT/ML ~~LOC~~ SOLN: SUBCUTANEOUS | 5 refills | Status: DC

## 2024-05-25 NOTE — Addendum Note (Signed)
 Addended by: Xxavier Noon M on: 05/25/2024 10:54 AM   Modules accepted: Orders

## 2024-05-29 ENCOUNTER — Other Ambulatory Visit: Payer: Self-pay | Admitting: Pharmacist

## 2024-05-29 NOTE — Progress Notes (Signed)
 05/29/2024 Name: Gabriella Marsh MRN: [id number] DOB: 03-Sep-1966  Chief Complaint  Patient presents with   Diabetes    Gabriella Marsh is a 58 y.o. year old female who presented for a telephone visit.   They were referred to the pharmacist by their PCP for assistance in managing diabetes.   Referral for DM control  ER 3 weeks ago for leg swelling and high BG's- found out Dexcom was not working  Subjective:  Care Team: Primary Care Provider: Carlean Charter, DO ; Next Scheduled Visit: 05/22/24 Clinical Pharmacist: Delvin File, PharmD  Medication Access/Adherence  Current Pharmacy:  [facility] DR[facility] STORE [phone] - [place] Gall[place], [place] - [address] AT [facility] [address] [place] [address] [place] [address] Phone: [phone] Fax: [phone]  [facility] - [place], [place] - [address] [place] [address] Phone: [phone] Fax: [phone]   Patient reports affordability concerns with their medications: No  Patient reports access/transportation concerns to their pharmacy: No  Patient reports adherence concerns with their medications:  No     Diabetes:  Current medications: Trulicity  0.75mg  weekly, Lantus  55U at bedtime, Novolog  6U with meals, Metformin  1000mg  twice a day (max dose) Medications tried in the past: Glipizide  (stopped insulin ), Trulicity  1.5mg  weekly (2023 due to shortage)   Using Accu-chek meter; testing 0 times daily (needs to find) *Currently uses Dexcom G7  05/29/24 visit:    05/24/24 visit:   05/19/24 visit:    5/27 visit:   5/13 visit:    Patient denies hypoglycemic s/sx including dizziness, shakiness, sweating. Patient denies hyperglycemic symptoms including polyuria, polydipsia, polyphagia, nocturia, neuropathy, blurred vision.  Current meal patterns:  - Breakfast (5:30AM): Bowl of cheerios + cup of coffee  - Lunch (12-1PM): Mrs. [name] frozen foods prior,  leftover now - Supper (5-6:30PM): Hot dogs, crockpot potato soup, eat out a good bit; goal of baked chicken + Mrs. Dash seasoning, rice or mac'n'cheese weekly - Snacks: Half an orange or banana - Drinks: Water, Splenda tea (half large tumbler daily) *Husband doesn't know how to cook- mostly microwavable items  Current physical activity: Can't walk- uses wheelchair  Current medication access support: [employer] Commercial   Objective:  Lab Results  Component Value Date   HGBA1C 11.1 (H) 04/21/2024    Lab Results  Component Value Date   CREATININE 0.68 04/21/2024   BUN 16 04/21/2024   NA 133 (L) 04/21/2024   K 5.4 (H) 04/21/2024   CL 92 (L) 04/21/2024   CO2 26 04/21/2024    Lab Results  Component Value Date   CHOL 166 04/21/2024   HDL 36 (L) 04/21/2024   LDLCALC 70 04/21/2024   TRIG 377 (H) 04/21/2024   CHOLHDL 4.6 (H) 04/21/2024    Medications Reviewed Today   Medications were not reviewed in this encounter       Assessment/Plan:   Diabetes: - Currently uncontrolled - Reviewed long term cardiovascular and renal outcomes of uncontrolled blood sugar - Reviewed goal A1c, goal fasting, and goal 2 hour post prandial glucose - Patient denies personal or family history of multiple endocrine neoplasia type 2, medullary thyroid  cancer; personal history of pancreatitis or gallbladder disease. - Recommend to check glucose continuously with Dexcom G7  Sliding scale from sister-in-law: 150-200= 2U 201-254= 4U 251-300= 6U 301-350= 8U 351-400= 10U   Follow Up Plan:  - Follow-up on 06/02/24 for insulin  titration + Dexcom review - INCREASE  Trulicity  to 1.5mg  weekly- starting next Tuesday (received in mail already)  - Had diarrhea with any Ozempic  doses higher than 0.25mg  weekly  - Agreed to doing Trulicity  again (was up to 1.5mg  weekly prior); did well and only switched due to backorder issues - Continue Lantus  55U nightly  - Switching to Semglee  at next fill for insurance  coverage  - Advised pharmacy said it was ready today at no charge- just needed to fill it - MODIFY Novolog  to 10U with each meal (instead of sliding scale)    Delvin File, PharmD [facility]  Phone Number: [phone]

## 2024-06-02 ENCOUNTER — Other Ambulatory Visit: Payer: Self-pay | Admitting: Pharmacist

## 2024-06-02 DIAGNOSIS — E1140 Type 2 diabetes mellitus with diabetic neuropathy, unspecified: Secondary | ICD-10-CM

## 2024-06-02 NOTE — Progress Notes (Signed)
 06/02/2024 Name: Gabriella Marsh MRN: [id number] DOB: October 30, 1966  Chief Complaint  Patient presents with   Diabetes    Gabriella Marsh is a 58 y.o. year old female who presented for a telephone visit.   They were referred to the pharmacist by their PCP for assistance in managing diabetes.   Referral for DM control  ER 3 weeks ago for leg swelling and high BG's- found out Dexcom was not working  Subjective:  Care Team: Primary Care Provider: Carlean Charter, DO ; Next Scheduled Visit: 05/22/24 Clinical Pharmacist: Gabriella Marsh, PharmD  Medication Access/Adherence  Current Pharmacy:  [facility] [phone] - [place], [place] - [address] AT [facility] [address] [place] [address] [place] [address] Phone: [phone] Fax: [phone]  [facility] - [place], [place] - [address] [place] [address] Phone: [phone] Fax: [phone]   Patient reports affordability concerns with their medications: No  Patient reports access/transportation concerns to their pharmacy: No  Patient reports adherence concerns with their medications:  No     Diabetes:  Current medications: Trulicity  0.75mg  weekly, Semglee  55U at bedtime, Novolog  10U with meals, Metformin  1000mg  twice a day (max dose) Medications tried in the past: Glipizide  (stopped insulin ), Trulicity  1.5mg  weekly (2023 due to shortage)   Using Accu-chek meter; testing 0 times daily (needs to find) *Currently uses Dexcom G7  06/01/24 visit:   05/29/24 visit:    05/24/24 visit:   05/19/24 visit:    5/27 visit:   5/13 visit:    Patient denies hypoglycemic s/sx including dizziness, shakiness, sweating. Patient denies hyperglycemic symptoms including polyuria, polydipsia, polyphagia, nocturia, neuropathy, blurred vision.  Current meal patterns:  - Breakfast (5:30AM): Bowl of cheerios + cup of coffee  - Lunch (12-1PM): Mrs. Gabriella Marsh  frozen foods prior, leftover now - Supper (5-6:30PM): Hot dogs, crockpot potato soup, eat out a good bit; goal of baked chicken + Mrs. Dash seasoning, rice or mac'n'cheese weekly - Snacks: Half an orange or banana - Drinks: Water, Splenda tea (half large tumbler daily) *Husband doesn't know how to cook- mostly microwavable items  Current physical activity: Can't walk- uses wheelchair  Current medication access support: [employer]   Objective:  Lab Results  Component Value Date   HGBA1C 11.1 (H) 04/21/2024    Lab Results  Component Value Date   CREATININE 0.68 04/21/2024   BUN 16 04/21/2024   NA 133 (L) 04/21/2024   K 5.4 (H) 04/21/2024   CL 92 (L) 04/21/2024   CO2 26 04/21/2024    Lab Results  Component Value Date   CHOL 166 04/21/2024   HDL 36 (L) 04/21/2024   LDLCALC 70 04/21/2024   TRIG 377 (H) 04/21/2024   CHOLHDL 4.6 (H) 04/21/2024    Medications Reviewed Today   Medications were not reviewed in this encounter       Assessment/Plan:   Diabetes: - Currently uncontrolled - Reviewed long term cardiovascular and renal outcomes of uncontrolled blood sugar - Reviewed goal A1c, goal fasting, and goal 2 hour post prandial glucose - Patient denies personal or family history of multiple endocrine neoplasia type 2, medullary thyroid  cancer; personal history of pancreatitis or gallbladder disease. - Recommend to check glucose continuously with Dexcom G7  Sliding scale from sister-in-law: 150-200= 2U 201-254= 4U 251-300= 6U 301-350= 8U 351-400= 10U   Follow Up Plan:  - Follow-up on 06/07/24 for insulin  titration +  Dexcom review - INCREASE Trulicity  to 1.5mg  weekly- starting next Tuesday (received in mail already)  - Had diarrhea with any Ozempic  doses higher than 0.25mg  weekly  - Trulicity  3mg  dose will be due on 7/15 - Continue Semglee  55U nightly  - Ran out of Lantus  last night and could only take 30U; pharmacy just got shipment of Semglee  in today and  plans to have husband go get it later - MODIFY Novolog  to 14U with each meal (instead of sliding scale)    Gabriella Marsh, PharmD [facility]  Phone Number: [phone]

## 2024-06-05 ENCOUNTER — Telehealth: Payer: Self-pay

## 2024-06-05 MED ORDER — INSULIN SYRINGE 31G X 5/16" 1 ML MISC: 1 refills | Status: DC

## 2024-06-05 MED ORDER — INSULIN GLARGINE-YFGN 100 UNIT/ML ~~LOC~~ SOPN: PEN_INJECTOR | SUBCUTANEOUS | 2 refills | Status: DC

## 2024-06-05 NOTE — Addendum Note (Signed)
 Addended by: Yasser Hepp M on: 06/05/2024 12:36 PM   Modules accepted: Orders

## 2024-06-05 NOTE — Telephone Encounter (Signed)
 Called and spoke with the patient on the phone. She reports getting vials instead of pens. Willing to do vials for now- just needs syringes sent in.   Called [facility] to explain that script said Semglee  injection which is listed the same way as the Semglee  packaging, as well as with a script for pen needles and 45mL (vials do NOT come in 45mL). Report that they are required to fill for vials unless specified for pens regardless of quantity listed. Also since patient picked up the medication, they cannot return it. Also insurance will now NOT cover the pens since vials were picked up instead for at least a 2 month supply.  Rx sent to specify pens going forward and insulin  syringes. They will notify patient when the script is ready.   Delvin File, PharmD [facility]  Phone Number: [phone]

## 2024-06-05 NOTE — Telephone Encounter (Signed)
 Copied from CRM [id number]. Topic: Clinical - Prescription Issue >> Jun 02, 2024  4:16 PM Sasha H wrote: Reason for CRM: Pt is wanting to speak with Raven the pharmacist. States she received vials of medicine but no needles  ----------------------------------------------------------------------- From previous Reason for Contact - Medical Advice: Reason for CRM:

## 2024-06-07 ENCOUNTER — Other Ambulatory Visit: Payer: Self-pay | Admitting: Pharmacist

## 2024-06-07 NOTE — Progress Notes (Signed)
 06/07/2024 Name: Gabriella Marsh MRN: [id number] DOB: November 15, 1966  Chief Complaint  Patient presents with   Diabetes    Gabriella Marsh is a 58 y.o. year old female who presented for a telephone visit.   They were referred to the pharmacist by their PCP for assistance in managing diabetes.   Referral for DM control  ER 3 weeks ago for leg swelling and high BG's- found out Dexcom was not working  Subjective:  Care Team: Primary Care Provider: Carlean Charter, DO ; Next Scheduled Visit: 05/22/24 Clinical Pharmacist: Delvin File, PharmD  Medication Access/Adherence  Current Pharmacy:  [facility] [phone] - [place], [place] - [address] AT [facility] OF [address] [place] [address] [place] [address] Phone: [phone] Fax: [phone]  [facility] - [place], [place] - [address] [place] [address] Phone: [phone] Fax: [phone]   Patient reports affordability concerns with their medications: No  Patient reports access/transportation concerns to their pharmacy: No  Patient reports adherence concerns with their medications:  No     Diabetes:  Current medications: Trulicity  1.5mg  weekly, Semglee  55U at bedtime, Novolog  14U with meals, Metformin  1000mg  twice a day (max dose) Medications tried in the past: Glipizide  (stopped insulin ), Trulicity  1.5mg  weekly (2023 due to shortage)   Using Accu-chek meter; testing 0 times daily (needs to find) *Currently uses Dexcom G7  06/01/24 visit:   05/29/24 visit:    05/24/24 visit:   05/19/24 visit:    5/27 visit:   5/13 visit:    Patient denies hypoglycemic s/sx including dizziness, shakiness, sweating. Patient denies hyperglycemic symptoms including polyuria, polydipsia, polyphagia, nocturia, neuropathy, blurred vision.  Current meal patterns:  - Breakfast (5:30AM): Bowl of cheerios + cup of coffee  - Lunch (12-1PM): Mrs. Gabriella Marsh  frozen foods prior, leftover now - Supper (5-6:30PM): Hot dogs, crockpot potato soup, eat out a good bit; goal of baked chicken + Mrs. Dash seasoning, rice or mac'n'cheese weekly - Snacks: Half an orange or banana - Drinks: Water, Splenda tea (half large tumbler daily) *Husband doesn't know how to cook- mostly microwavable items  Current physical activity: Can't walk- uses wheelchair  Current medication access support: [employer] Commercial   Objective:  Lab Results  Component Value Date   HGBA1C 11.1 (H) 04/21/2024    Lab Results  Component Value Date   CREATININE 0.68 04/21/2024   BUN 16 04/21/2024   NA 133 (L) 04/21/2024   K 5.4 (H) 04/21/2024   CL 92 (L) 04/21/2024   CO2 26 04/21/2024    Lab Results  Component Value Date   CHOL 166 04/21/2024   HDL 36 (L) 04/21/2024   LDLCALC 70 04/21/2024   TRIG 377 (H) 04/21/2024   CHOLHDL 4.6 (H) 04/21/2024    Medications Reviewed Today   Medications were not reviewed in this encounter       Assessment/Plan:   Diabetes: - Currently uncontrolled - Reviewed long term cardiovascular and renal outcomes of uncontrolled blood sugar - Reviewed goal A1c, goal fasting, and goal 2 hour post prandial glucose - Patient denies personal or family history of multiple endocrine neoplasia type 2, medullary thyroid  cancer; personal history of pancreatitis or gallbladder disease. - Recommend to check glucose continuously with Dexcom G7  Sliding scale from sister-in-law: 150-200= 2U 201-254= 4U 251-300= 6U 301-350= 8U 351-400= 10U   Follow Up Plan:  - Follow-up on 06/14/24 for insulin  titration +  Dexcom review - INCREASE Trulicity  to 1.5mg  weekly- starting next today  - Had diarrhea with any Ozempic  doses higher than 0.25mg  weekly  - Trulicity  3mg  dose will be due on 7/15 - Continue Semglee  55U nightly  - Ran out of insulin  and needs syringes for vials- hasn't had long-acting insulin  since Monday at minimum- got disability check today  so planning to get syringes for $25 at the pharmacy today  - Confirms understanding of how to draw up insulin  with this method- has done it before - Continue Novolog  to 14U with each meal (instead of sliding scale)  - Of note, due to being out of Lantus , has been taking more Novolog  instead to counteract- may run out sooner   Delvin File, PharmD [facility]  Phone Number: [phone]

## 2024-06-08 NOTE — Therapy (Incomplete)
 OUTPATIENT PHYSICAL THERAPY NEURO treatment/ RECERT ***      Patient Name: Gabriella Marsh MRN: [id number] DOB:02-18-1966, 58 y.o., female Today's Date: 06/08/2024   PCP: Carlean Charter, DO REFERRING PROVIDER: Seena Dadds, MD  END OF SESSION:     Past Medical History:  Diagnosis Date   Arthritis    knees   Blood clotting disorder (HCC)    on toe    Degenerative disc disease, lumbar    Diabetes mellitus without complication (HCC)    GERD (gastroesophageal reflux disease)    Headache    daily - AM (has not been able to have SPG blocks lately)   Hyperlipidemia    Hypertension    Neuropathy    feet   Vertigo    3-4x/yr   Past Surgical History:  Procedure Laterality Date   ABDOMINAL HYSTERECTOMY     But still has cervix   AMPUTATION TOE Right 07/24/2021   Procedure: AMPUTATION TOE;  Surgeon: Angel Barba, DPM;  Location: [facility] ORS;  Service: Podiatry;  Laterality: Right;   CESAREAN SECTION     CHOLECYSTECTOMY     COLONOSCOPY WITH PROPOFOL  N/A 02/20/2022   Procedure: COLONOSCOPY WITH PROPOFOL ;  Surgeon: Luke Salaam, MD;  Location: [facility] ENDOSCOPY;  Service: Gastroenterology;  Laterality: N/A;   left arm frature  10/12/2020   OVARIAN CYST SURGERY     PARATHYROIDECTOMY  03/28/2021   Procedure: PARATHYROIDECTOMY AUTOTRANSPLANT;  Surgeon: Mercy Stall, MD;  Location: [facility] ORS;  Service: General;;   right femur surgery Right    SHOULDER SURGERY Left 12/26/014   Dr. Waymond Hailey, [place]   THYROIDECTOMY N/A 03/28/2021   Procedure: THYROIDECTOMY, total;  Surgeon: Mercy Stall, MD;  Location: [facility] ORS;  Service: General;  Laterality: N/A;  Provider requesting 3 hours /180 minutes for procedure   TONSILLECTOMY AND ADENOIDECTOMY     UTERINE FIBROID SURGERY     Patient Active Problem List   Diagnosis Date Noted   Neuropathy 04/21/2024   Lymphedema 04/21/2024   Wheelchair dependence 03/10/2023   Hypertension associated with diabetes (HCC) 03/10/2023   AKI  (acute kidney injury) (HCC) 01/07/2023   Neurogenic bladder 01/07/2023   FTT (failure to thrive) in adult 12/09/2022   Primary insomnia 09/10/2022   Impaired functional mobility, balance, gait, and endurance 06/09/2022   Type 2 diabetes mellitus with diabetic neuropathy, with long-term current use of insulin  (HCC) 04/24/2022   Hypothyroidism 04/24/2022   Hyperlipidemia associated with type 2 diabetes mellitus (HCC) 04/24/2022   Iron deficiency anemia 04/24/2022   Chronic sensorimotor polyneuropathy with axonal and demyelinating features 01/12/2022   Osteoarthritis of knees (Bilateral) 01/12/2022   Tricompartment osteoarthritis of knees (Bilateral) 01/12/2022    Grade 1 Retrolisthesis (9mm) of L5 over S1 01/12/2022   History of proximal humerus fracture (Left) 01/12/2022   Vitamin D  deficiency 01/12/2022   Chronic peripheral neuropathic pain 01/12/2022   Disorder of skeletal system 11/12/2021   Problems influencing health status 11/12/2021   Chronic hand pain (1ry area of Pain) (Bilateral) (L>R) 11/12/2021   Chronic feet pain (3ry area of Pain) (Bilateral) (L>R) 11/12/2021   Chronic shoulder pain (4th area of Pain) (Left) 11/12/2021   Chronic knee pain (5th area of Pain) (Bilateral) (L>R) 11/12/2021   Lumbar facet syndrome (Bilateral) 11/12/2021   COVID-19 virus infection 07/22/2021   Chronic pain syndrome 07/17/2021   S/P total thyroidectomy 03/28/2021   Thyroid  nodule    Schamberg's purpura 10/09/2019   Common migraine with intractable migraine 06/18/2015   DDD (degenerative disc  disease), lumbosacral 05/03/2015   Insomnia due to medical condition 05/03/2015   Disorder of peripheral nervous system 05/03/2015   Diabetic peripheral neuropathy (HCC) 03/06/2015   Obesity 03/06/2015   GERD (gastroesophageal reflux disease) 03/06/2015   Multinodular goiter 03/06/2015   RLS (restless legs syndrome) 03/06/2015   Chronic low back pain (2ry area of Pain) (Bilateral) (L>R) w/o sciatica  03/06/2015    ONSET DATE: 06/18/22  REFERRING DIAG: S72.91XD (ICD-10-CM) - Unspecified fracture of right femur, subsequent encounter for closed fracture with routine healing   THERAPY DIAG:  No diagnosis found.  Rationale for Evaluation and Treatment: Rehabilitation  SUBJECTIVE:                                                                                                                                                                                             SUBJECTIVE STATEMENT:   *** Patient has not been seen in over a month.    Pt accompanied by: Spouse   PERTINENT HISTORY: Pt is a pleasant 58 y/o female known to PT clinic, returning for impairments due to R femur fx sustained in 2023. Pt hx includes fall in June 2023 where pt suffered R femur fx followed by surgery where pt had plate and screws placed in R knee and rod in R femur. At the time pt with hx of multiple falls, now pt reports no falls in last 6 months. Since previous d/c from PT on 05/13/2023, pt has been consistently going to [facility] clinic 1x/week, and now returns to [facility] for new year. Pt reports she ambulated with RW while at [facility] clinic, has been able to perform STS to RW from higher surfaces. She would like to continue working on her mobility and strength, including both UE and LE strength. She still struggles with putting on her shoes and socks and getting from her bed to the commode. She reports standing at [facility] also limited due to chronic low back pain. She reports she'll likely get injections to tx LBP in future. Pt still performing previous HEP given, typically she completes 1 set of each exercise for 25-30 reps. Says this is easy. PMH per chart significant for arthritis, blood clotting disorder, DDD (lumbar), DM, headache, HTN, neuropathy, vertigo   PAIN:  Are you having pain? Yes: NPRS scale: not numerically rated Pain location: low back, bilat hand pain hurts a lot all the time  Pain description:   Aggravating  factors: standing limited due to LBP in position Relieving factors: wearing gloves helps with hand pain  PRECAUTIONS: Fall  RED FLAGS: None   WEIGHT BEARING RESTRICTIONS: No  FALLS: Has patient fallen  in last 6 months? No  LIVING ENVIRONMENT: via chart, pt confirms the following is still correct Lives with: lives with their family and lives with their spouse Lives in: House/apartment Stairs: No Has following equipment at home: Environmental consultant - 2 wheeled, Wheelchair (manual), Shower bench, bed side commode, and Grab bars  PLOF: Needs assistance with ADLs  PATIENT GOALS: Get out from bed to walker to bedside commode with or without LE braces donned, improve strength in UE/LE  OBJECTIVE:  Note: Objective measures were completed at Evaluation unless otherwise noted.  DIAGNOSTIC FINDINGS:   Via chart [facility] care 05/13/2023 XR Femur 2 views right: Impression  Unchanged position and alignment with further osseous bridging at the distal femur fracture following intramedullary rod fixation. And findings FINDINGS:  Position and alignment of the comminuted distal femur fracture are unchanged after placement of a retrograde interlocking intramedullary rod. Proximal and distal interlocking components articulate appropriately with the rod. There is progressive osseous bridging with remodeling at the fracture site, there is no new fracture or dislocation.   COGNITION: Overall cognitive status: Within functional limits for tasks assessed   SENSATION: Reports impaired BLE, does have sensation on tops of thighs per report   COORDINATION: WFL UE rapid alt movement WFL LE with heel to shin, but unable to perform rapid alt movement due to weakness in bilat DFs  EDEMA:  Reports no swelling    POSTURE: rounded shoulders and forward head  LOWER EXTREMITY ROM:     Slight impairment with R ankle DF > L with PROM Slight impairment of L knee extension and somewhat painful    LOWER  EXTREMITY MMT:    Grossly 4/5 in BLE exception is pt unable to complete DF bilat, other areas of significant mm deficit include hip flexors, abductors and hamstrings bilat  TRANSFERS:  Attempted STS to RW from WC during eval. Unable to perform from wheelchair even with max assist. Pt reports she has been able to complete STS from higher surfaces and this is how she practiced in [facility]  Assistive device utilized: Walker - 2 wheeled  Sit to stand: unable from standard chair height/WC see above  FUNCTIONAL TESTS:  Attempted STS from [facility], unable at this time. Will attempt functional test like 30 sec STS in future as pt able  PATIENT SURVEYS:  LEFS 50/80 FOTO 54                                                                                                                              TREATMENT DATE: 12/29/2023  Physical therapy treatment session today consisted of completing assessment of goals and administration of testing as demonstrated and documented in flow sheet, treatment, and goals section of this note. Addition treatments may be found below.   Gait with AFO and KAFO x 45ft +90ft with CGA for safety. No LOB, KAFO held in extension on this day.   Sit<>stand 2x 5 with UE support on RW  and arm rest. CGA only for anterior weight shift.  LAQ 2x 15 bil  Hip abduction RTB x 15  HS curl RTB 2x 12 bil  Hip flexion AROM x 15  Stand pivot transfer to and from  [facility] with CGA fading to min assist with fatigue due to mild posterior LOB.   Rest breaks provided throughout session. Noted to prefer to keep KAFO locked today, resulting in decreased step length and increased time       PATIENT EDUCATION: Education details: assessment findings, goals, plan, HEP. WC planning HEP expansion  Pt educated throughout session about proper posture and technique with exercises. Improved exercise technique, movement at target joints, use of target muscles after min to mod verbal, visual, tactile  cues.  Person educated: Patient and Spouse Education method: Explanation, Verbal cues, and Handouts Education comprehension: verbalized understanding and returned demonstration  HOME EXERCISE PROGRAM: Access Code: [email or web address] URL: [email or web address] Date: 12/29/2023 Prepared by: Aminta Kales  Exercises - Seated Hamstring Curls with Resistance  - 1 x daily - 5-6 x weekly - 3-4 sets - 20-25 reps - Seated Hip Abduction with Resistance  - 1 x daily - 5-6 x weekly - 3-4 sets - 20-25 reps - Seated March  - 1 x daily - 5-6 x weekly - 3-4 sets - 20-25 reps   Access Code: [email or web address] URL: [email or web address] Date: 01/26/2024 Prepared by: Aurora Lees  Exercises - Supine Heel Slide  - 1 x daily - 7 x weekly - 3 sets - 10 reps - Supine Straight Leg Raises  - 1 x daily - 7 x weekly - 3 sets - 10 reps - Hooklying Clamshell with Resistance  - 1 x daily - 7 x weekly - 3 sets - 10 reps - Supine Hip Flexion  - 1 x daily - 7 x weekly - 3 sets - 10 reps - Supine Single Leg Ankle Pumps  - 1 x daily - 7 x weekly - 3 sets - 10 reps - Supine Bridge  - 1 x daily - 7 x weekly - 3 sets - 10 reps GOALS: Goals reviewed with patient? Yes  SHORT TERM GOALS: Target date: 02/09/2024   Patient will be independent in home exercise program to improve strength/mobility for better functional independence with ADLs. Baseline: initiated Goal status: INITIAL    LONG TERM GOALS: Target date: 03/22/2024 ***   Patient will perform sit to stand with min-mod assist from standard chair height in order to indicate improved lower extremity functional strength and improved ability to perform functional transfers and functional leg activities within her home.  Baseline: mod assist with +2 for safety.  4/2: mod assist  Goal status: INITIAL  2.  Pt will improve LEFS by 10 points or more in order to indicate improved LE function and mobility.  Baseline: 50 4/2: 32 Goal status: INITIAL  3.   Patient will increase FOTO score to equal to or greater than  59   to demonstrate statistically significant improvement in mobility and quality of life.  Baseline: 54 Goal status: goal d/c'ed.   4.  Pt will require no more than min assist for transfer from bed to bedside commode Baseline: supervision assist with  slide board transfer. To mat table  Using walker for transfer to [facility] with mod assist for safety and  Goal status: progressing   5.  The pt will demo ability to don socks and shoes indep or mod I for increased ease with dressing and improved independence Baseline: required max  assist from husband  4/2: pt states that she is donning night socks. Is still requiring max assist for LLE and moderate assist for the RLE  Goal status: Progressing  6.  The pt will demo ability to complete 30 second STS test to indicate increased ease with transfers and BLE strength Baseline: 2 sit<>stand from [facility] with cushion in place and  +2 for safety.  4/2: 3 with mod assist and +2 to stabilize RW  Goal status: progressing    ASSESSMENT:  CLINICAL IMPRESSION:  Patient has not been seen in over a month***  The pt will benefit from further skilled PT to improve these impairments in order to increase ease with ADLs, QOL and to reduce fall risk.   OBJECTIVE IMPAIRMENTS: Abnormal gait, decreased activity tolerance, decreased balance, decreased coordination, decreased endurance, decreased mobility, difficulty walking, decreased ROM, decreased strength, hypomobility, impaired sensation, improper body mechanics, postural dysfunction, and pain.   ACTIVITY LIMITATIONS: carrying, lifting, bending, standing, squatting, stairs, transfers, bed mobility, bathing, toileting, dressing, and locomotion level  PARTICIPATION LIMITATIONS: meal prep, cleaning, laundry, driving, shopping, community activity, and yard work  PERSONAL FACTORS: Fitness, Sex, Time since onset of injury/illness/exacerbation, and 3+ comorbidities:  PMH per chart significant for arthritis, blood clotting disorder, DDD (lumbar), DM, headache, HTN, neuropathy, vertigo are also affecting patient's functional outcome.   REHAB POTENTIAL: Good  CLINICAL DECISION MAKING: Evolving/moderate complexity  EVALUATION COMPLEXITY: Moderate  PLAN:  PT FREQUENCY: 1-2x/week  PT DURATION: 12 weeks  PLANNED INTERVENTIONS: [id number]- PT Re-evaluation, 97110-Therapeutic exercises, 97530- Therapeutic activity, [id number]- Neuromuscular re-education, 97535- Self Care, 96045- Manual therapy, [id number]- Gait training, [phone]- Orthotic Fit/training, [phone]- Canalith repositioning, [id number]- Electrical stimulation (manual), [id number]- Ultrasound, Patient/Family education, Balance training, Stair training, Taping, Dry Needling, Joint mobilization, Spinal mobilization, Vestibular training, DME instructions, Wheelchair mobility training, Cryotherapy, and Moist heat  PLAN FOR NEXT SESSION:   Continue BLE strengthening.  Gait as tolerated with bracing as appropriate.   Brenson Hartman, PT 06/08/2024, 4:29 PM

## 2024-06-12 ENCOUNTER — Encounter: Payer: Self-pay | Admitting: Family Medicine

## 2024-06-12 ENCOUNTER — Ambulatory Visit: Attending: Orthopedic Surgery

## 2024-06-12 ENCOUNTER — Ambulatory Visit (INDEPENDENT_AMBULATORY_CARE_PROVIDER_SITE_OTHER): Admitting: Family Medicine

## 2024-06-12 VITALS — BP 102/69 | HR 83 | Ht 70.0 in | Wt 243.0 lb

## 2024-06-12 DIAGNOSIS — K219 Gastro-esophageal reflux disease without esophagitis: Secondary | ICD-10-CM

## 2024-06-12 DIAGNOSIS — Z1231 Encounter for screening mammogram for malignant neoplasm of breast: Secondary | ICD-10-CM

## 2024-06-12 DIAGNOSIS — R2681 Unsteadiness on feet: Secondary | ICD-10-CM | POA: Insufficient documentation

## 2024-06-12 DIAGNOSIS — I152 Hypertension secondary to endocrine disorders: Secondary | ICD-10-CM

## 2024-06-12 DIAGNOSIS — E1140 Type 2 diabetes mellitus with diabetic neuropathy, unspecified: Secondary | ICD-10-CM

## 2024-06-12 DIAGNOSIS — E1159 Type 2 diabetes mellitus with other circulatory complications: Secondary | ICD-10-CM | POA: Diagnosis not present

## 2024-06-12 DIAGNOSIS — R600 Localized edema: Secondary | ICD-10-CM | POA: Diagnosis not present

## 2024-06-12 DIAGNOSIS — G4730 Sleep apnea, unspecified: Secondary | ICD-10-CM

## 2024-06-12 DIAGNOSIS — R262 Difficulty in walking, not elsewhere classified: Secondary | ICD-10-CM | POA: Insufficient documentation

## 2024-06-12 DIAGNOSIS — E039 Hypothyroidism, unspecified: Secondary | ICD-10-CM

## 2024-06-12 DIAGNOSIS — M6281 Muscle weakness (generalized): Secondary | ICD-10-CM | POA: Insufficient documentation

## 2024-06-12 DIAGNOSIS — R0683 Snoring: Secondary | ICD-10-CM

## 2024-06-12 DIAGNOSIS — R2689 Other abnormalities of gait and mobility: Secondary | ICD-10-CM | POA: Diagnosis not present

## 2024-06-12 DIAGNOSIS — Z794 Long term (current) use of insulin: Secondary | ICD-10-CM

## 2024-06-12 MED ORDER — OMEPRAZOLE 40 MG PO CPDR: 40.0000 mg | DELAYED_RELEASE_CAPSULE | Freq: Every day | ORAL | 3 refills | Status: DC

## 2024-06-12 MED ORDER — FUROSEMIDE 20 MG PO TABS: 10.0000 mg | ORAL_TABLET | Freq: Every day | ORAL | 3 refills | Status: DC | PRN

## 2024-06-12 NOTE — Patient Instructions (Signed)
 Please call the [facility] [phone]) to schedule a routine screening mammogram.

## 2024-06-12 NOTE — Progress Notes (Signed)
 Established patient visit   Patient: Gabriella Marsh   DOB: [birth year]   58 y.o. Female  MRN: [id number] Visit Date: 06/12/2024  Today's healthcare provider: LAURAINE LOISE BUOY, DO   Chief Complaint  Patient presents with   Diabetes   Subjective    Diabetes   Gabriella Marsh is a 58 year old female with diabetes and GERD who presents with concerns about her blood sugar management and nighttime reflux symptoms. She is accompanied by her husband, who assists her with mobility.  Her blood sugar levels have shown improvement, with her A1c now at 9.7, based on the estimate given by her Dexcom app. She has been adjusting her Novolog  dosage in coordination with Aloysius Lewis, our population health pharmacist, currently taking 28 units in the morning and at lunch, and 55 units of Semglee  at night. She recently started on Trulicity  1.5 mg last week and is closely monitoring her blood sugar levels. She is actively managing her diet, although she occasionally consumes high-carb foods like french fries. She alternates her Dexcom sensor between her belly and arm for continuous glucose monitoring.  She experiences nighttime reflux symptoms, waking up coughing and frequently consuming Tums. She has been on omeprazole  20 mg for a couple of years, which was initially effective, but she now wakes up with a burning sensation in her throat. She also reports a new symptom of coughing before sneezing.  She and her husband is concerned as these symptoms disturb his sleep. No current nausea or vomiting, except for one episode of nighttime nausea.  She has a history of a colonoscopy that was incomplete due to being 'blocked up' and has not been redone since she broke her leg. She is currently undergoing physical therapy and can stand and walk with assistance. She uses a gait belt, and her husband assists her with mobility to prevent falls, as she previously broke her leg due to a fall when her knee  buckled.  She experiences episodes of waking up gasping for air at night, which have occurred a few times over the past couple of months. She had a sleep apnea test about 15 years ago. She is concerned about her family history of congestive heart failure, as her father died at 43 from the condition. She recently had a normal echocardiogram and EKG.  She is on levothyroxine  for her thyroid , which she initially took incorrectly but has since adjusted to the correct dosage. She feels better and has more ambition since taking the medication correctly. She also takes vitamin D  once a week.  She has experienced swelling in her ankles and feet, which makes it difficult to wear her leg braces. She has a history of being on multiple blood pressure medications, which previously led to hospitalization. She currently monitors her blood pressure at home and is cautious about medication use to avoid low blood pressure.       Medications: Outpatient Medications Prior to Visit  Medication Sig   acetaminophen  (TYLENOL ) 500 MG tablet Take 1,000 mg by mouth every 12 (twelve) hours as needed for moderate pain.   atorvastatin  (LIPITOR) 20 MG tablet TAKE 1 TABLET(20 MG) BY MOUTH DAILY   cyclobenzaprine  (FLEXERIL ) 5 MG tablet Take 1 tablet (5 mg total) by mouth 3 (three) times daily as needed for muscle spasms.   Dulaglutide  (TRULICITY ) 1.5 MG/0.5ML SOAJ Inject 1.5 mg into the skin once a week.   fluticasone  (FLONASE ) 50 MCG/ACT nasal spray Place 2 sprays into both  nostrils daily.   gabapentin  (NEURONTIN ) 800 MG tablet Take 1 tablet (800 mg total) by mouth 3 (three) times daily.   glucose blood (ACCU-CHEK GUIDE TEST) test strip Use to check blood sugar up to 3 times a day with insulin  use. Dx code: E11.41   insulin  aspart (NOVOLOG ) 100 UNIT/ML FlexPen Inject up to 10 units three times a day with meals according to sliding scale. Hold if not eating. Max total daily dose of 30 units.   insulin  glargine-yfgn (SEMGLEE )  100 UNIT/ML Pen Inject up to 75 units daily. Titrate as directed by PCP. Dx code E11.40. Inactivate vial script.   insulin  glargine-yfgn (SEMGLEE , YFGN,) 100 UNIT/ML injection Inject up to 75 units daily. Titrate as directed by PCP.   Insulin  Pen Needle 30G X 5 MM MISC Use to inject insulin  4 times per day as prescribed. Dx code: E11.40   Insulin  Syringe-Needle U-100 (INSULIN  SYRINGE 1CC/31GX5/16) 31G X 5/16 1 ML MISC Use with Semglee  vial once a day. Dx code E11.40   levothyroxine  (SYNTHROID ) 112 MCG tablet Take 2 tablets (224 mcg total) by mouth daily.   lisinopril  (ZESTRIL ) 2.5 MG tablet Take 1 tablet (2.5 mg total) by mouth daily.   Melatonin 10 MG TABS Take 10 mg by mouth at bedtime.   metFORMIN  (GLUCOPHAGE ) 500 MG tablet Take 2 tablets (1,000 mg total) by mouth 2 (two) times daily with a meal.   nortriptyline  (PAMELOR ) 50 MG capsule Take 1 capsule (50 mg total) by mouth at bedtime.   nystatin -triamcinolone  ointment (MYCOLOG) Apply 1 Application topically 2 (two) times daily.   oxyCODONE -acetaminophen  (PERCOCET) 5-325 MG tablet Take 1 tablet by mouth every 8 (eight) hours. Must last 30 days.   [START ON 06/17/2024] oxyCODONE -acetaminophen  (PERCOCET) 5-325 MG tablet Take 1 tablet by mouth every 8 (eight) hours. Must last 30 days.   oxyCODONE -acetaminophen  (PERCOCET) 5-325 MG tablet Take 1 tablet by mouth every 8 (eight) hours. Must last 30 days.   traZODone  (DESYREL ) 100 MG tablet Take 1 tablet (100 mg total) by mouth at bedtime.   Vitamin D , Ergocalciferol , (DRISDOL ) 1.25 MG (50000 UNIT) CAPS capsule Take 1 capsule (50,000 Units total) by mouth every 7 (seven) days.   [DISCONTINUED] furosemide  (LASIX ) 20 MG tablet Take 1 tablet (20 mg total) by mouth daily for 3 days.   [DISCONTINUED] omeprazole  (PRILOSEC) 20 MG capsule Take 1 capsule (20 mg total) by mouth daily. TAKE 1 CAPSULE DAILY   No facility-administered medications prior to visit.        Objective    BP 102/69 (BP Location:  Left Arm, Patient Position: Sitting, Cuff Size: Large)   Pulse 83   Ht 5' 10 (1.778 m)   Wt 243 lb (110.2 kg)   SpO2 96%   BMI 34.87 kg/m     Physical Exam Constitutional:      Appearance: Normal appearance.  HENT:     Head: Normocephalic and atraumatic.   Eyes:     General: No scleral icterus.    Extraocular Movements: Extraocular movements intact.     Conjunctiva/sclera: Conjunctivae normal.    Cardiovascular:     Rate and Rhythm: Normal rate and regular rhythm.     Pulses: Normal pulses.     Heart sounds: Normal heart sounds.  Pulmonary:     Effort: Pulmonary effort is normal. No respiratory distress.     Breath sounds: Normal breath sounds.  Abdominal:     General: Bowel sounds are normal.   Musculoskeletal:     Right  lower leg: No edema.     Left lower leg: No edema.   Skin:    General: Skin is warm and dry.   Neurological:     Mental Status: She is alert and oriented to person, place, and time. Mental status is at baseline.   Psychiatric:        Mood and Affect: Mood normal.        Behavior: Behavior normal.      No results found for any visits on 06/12/24.  Assessment & Plan    Type 2 diabetes mellitus with diabetic neuropathy, with long-term current use of insulin  (HCC)  Hypertension associated with diabetes (HCC)  Bilateral lower extremity edema -     Furosemide ; Take 0.5 tablets (10 mg total) by mouth daily as needed for edema.  Dispense: 30 tablet; Refill: 3  Gastroesophageal reflux disease without esophagitis -     Ambulatory referral to Gastroenterology -     Omeprazole ; Take 1 capsule (40 mg total) by mouth daily.  Dispense: 90 capsule; Refill: 3  Observed sleep apnea -     Ambulatory referral to Sleep Studies  Loud snoring -     Ambulatory referral to Sleep Studies  Acquired hypothyroidism  Encounter for screening mammogram for breast cancer -     3D Screening Mammogram, Left and Right; Future     Type 2 Diabetes  [place]ell[place] A1c improved to 9.7% (based on sensor estimate) with current regimen. Continues dietary management and uses Dexcom for glucose monitoring. - Continue Novolog  and Semglee  as prescribed. - Continue Trulicity  1.5 mg. - Refill Dexcom G7 sensors. - Attend follow-up with Raven on Wednesday. - Recheck A1c on August 4th.  Hypertension associated with diabetes; bilateral lower extremity edema Blood pressure remains well-controlled.  Patient currently on lisinopril  for renal protection. Reports intermittent peripheral edema which makes it difficult to place her braces on her legs. History of multiple antihypertensives causing hospitalization. Furosemide  recommended for edema management. - Prescribe furosemide  20 mg, take half tablet daily as needed for edema. - Monitor blood pressure regularly at home.  Gastroesophageal Reflux Disease (GERD) Nocturnal coughing and burning throat suggest GERD. Symptoms worsened despite omeprazole  20 mg. Increase omeprazole  and refer to gastroenterology. - Increase omeprazole  to 40 mg. - Refer to gastroenterology for further evaluation.  Observed apnea; loud snoring Reports nocturnal gasping and history of snoring. Symptoms suggest sleep apnea. Referral for sleep study recommended.  Previously tested for sleep apnea 10 to 15 years ago - Refer for sleep study.  Hypothyroidism Improved energy levels with adjusted levothyroxine  dose (was taking 1 tablet instead of 2 of her levothyroxine ). - Continue levothyroxine  as prescribed.  Vitamin D  deficiency She has picked up and has been taking her high-dose vitamin D .  General Health Maintenance Due for mammogram and Pap smear. Plans to reschedule ophthalmology appointment due to retinal hemorrhage history. - Order mammogram. - Plan Pap smear at next visit. - Reschedule ophthalmology appointment.    Return in about 6 weeks (around 07/24/2024) for Pap, Chronic f/u.      I discussed the assessment and  treatment plan with the patient  The patient was provided an opportunity to ask questions and all were answered. The patient agreed with the plan and demonstrated an understanding of the instructions.   The patient was advised to call back or seek an in-person evaluation if the symptoms worsen or if the condition fails to improve as anticipated.    Nikayla Madaris LOISE BUOY, DO  [facility] [phone] (phone) [phone] (fax)  [facility]

## 2024-06-14 ENCOUNTER — Encounter: Payer: Self-pay | Admitting: Family Medicine

## 2024-06-14 ENCOUNTER — Other Ambulatory Visit: Payer: Self-pay | Admitting: Pharmacist

## 2024-06-14 DIAGNOSIS — Z794 Long term (current) use of insulin: Secondary | ICD-10-CM

## 2024-06-14 MED ORDER — INSULIN ASPART 100 UNIT/ML FLEXPEN: PEN_INJECTOR | SUBCUTANEOUS | 1 refills | Status: DC

## 2024-06-14 NOTE — Progress Notes (Signed)
 06/14/2024 Name: Gabriella Marsh MRN: [id number] DOB: August 11, 1966  Chief Complaint  Patient presents with   Diabetes    Gabriella Marsh is a 58 y.o. year old female who presented for a telephone visit.   They were referred to the pharmacist by their PCP for assistance in managing diabetes.   Referral for DM control  ER 3 weeks ago for leg swelling and high BG's- found out Dexcom was not working  Subjective:  Care Team: Primary Care Provider: Donzella Lauraine SAILOR, DO ; Next Scheduled Visit: 05/22/24 Clinical Pharmacist: Aloysius Lewis, PharmD  Medication Access/Adherence  Current Pharmacy:  [facility] [phone] - [place], [place] - [address] AT [facility] OF [address] [place] [address] [place] [address] Phone: [phone] Fax: [phone]  [facility] - [place], [place] - [address] [place] [address] Phone: [phone] Fax: [phone]   Patient reports affordability concerns with their medications: No  Patient reports access/transportation concerns to their pharmacy: No  Patient reports adherence concerns with their medications:  No     Diabetes:  Current medications: Trulicity  1.5mg  weekly, Semglee  55U at bedtime, Novolog  14U with meals, Metformin  1000mg  twice a day (max dose) Medications tried in the past: Glipizide  (stopped insulin ), Trulicity  1.5mg  weekly (2023 due to shortage)   Using Accu-chek meter; testing 0 times daily (needs to find) *Currently uses Dexcom G7  06/01/24 visit:   05/29/24 visit:    05/24/24 visit:   05/19/24 visit:    5/27 visit:   5/13 visit:    Patient denies hypoglycemic s/sx including dizziness, shakiness, sweating. Patient denies hyperglycemic symptoms including polyuria, polydipsia, polyphagia, nocturia, neuropathy, blurred vision.  Current meal patterns:  - Breakfast (5:30AM): Bowl of cheerios + cup of coffee  - Lunch (12-1PM): Mrs. Joselyn  frozen foods prior, leftover now - Supper (5-6:30PM): Hot dogs, crockpot potato soup, eat out a good bit; goal of baked chicken + Mrs. Dash seasoning, rice or mac'n'cheese weekly - Snacks: Half an orange or banana - Drinks: Water, Splenda tea (half large tumbler daily) *Husband doesn't know how to cook- mostly microwavable items  Current physical activity: Can't walk- uses wheelchair  Current medication access support: [employer]   Objective:  Lab Results  Component Value Date   HGBA1C 11.1 (H) 04/21/2024    Lab Results  Component Value Date   CREATININE 0.68 04/21/2024   BUN 16 04/21/2024   NA 133 (L) 04/21/2024   K 5.4 (H) 04/21/2024   CL 92 (L) 04/21/2024   CO2 26 04/21/2024    Lab Results  Component Value Date   CHOL 166 04/21/2024   HDL 36 (L) 04/21/2024   LDLCALC 70 04/21/2024   TRIG 377 (H) 04/21/2024   CHOLHDL 4.6 (H) 04/21/2024    Medications Reviewed Today   Medications were not reviewed in this encounter       Assessment/Plan:   Diabetes: - Currently uncontrolled - Reviewed long term cardiovascular and renal outcomes of uncontrolled blood sugar - Reviewed goal A1c, goal fasting, and goal 2 hour post prandial glucose - Patient denies personal or family history of multiple endocrine neoplasia type 2, medullary thyroid  cancer; personal history of pancreatitis or gallbladder disease. - Recommend to check glucose continuously with Dexcom G7  Sliding scale from sister-in-law: 150-200= 2U 201-254= 4U 251-300= 6U 301-350= 8U 351-400= 10U   Follow Up Plan:  - Follow-up on 06/26/24 for insulin  titration +  Dexcom review (send next dose of Trulicity  3mg ) - INCREASE Trulicity  to 1.5mg  weekly- starting next today  - Had diarrhea with any Ozempic  doses higher than 0.25mg  weekly  - Trulicity  3mg  dose will be due on 7/15 - Continue Semglee  55U nightly - Increase Novolog  to 20U three times a day- updated Rx sent to the pharmacy  - Regarding GERD  recommended to use additional pillow to prop herself up more at night - Next A1c check on 07/24/24   Aloysius Lewis, PharmD [place]  Phone Number: [phone]

## 2024-06-17 DIAGNOSIS — E1165 Type 2 diabetes mellitus with hyperglycemia: Secondary | ICD-10-CM | POA: Diagnosis not present

## 2024-06-19 DIAGNOSIS — E113592 Type 2 diabetes mellitus with proliferative diabetic retinopathy without macular edema, left eye: Secondary | ICD-10-CM | POA: Diagnosis not present

## 2024-06-19 DIAGNOSIS — E113491 Type 2 diabetes mellitus with severe nonproliferative diabetic retinopathy without macular edema, right eye: Secondary | ICD-10-CM | POA: Diagnosis not present

## 2024-06-19 DIAGNOSIS — H2513 Age-related nuclear cataract, bilateral: Secondary | ICD-10-CM | POA: Diagnosis not present

## 2024-06-21 ENCOUNTER — Ambulatory Visit: Attending: Orthopedic Surgery | Admitting: Physical Therapy

## 2024-06-21 ENCOUNTER — Ambulatory Visit: Attending: Nurse Practitioner | Admitting: Nurse Practitioner

## 2024-06-21 ENCOUNTER — Encounter: Payer: Self-pay | Admitting: Nurse Practitioner

## 2024-06-21 DIAGNOSIS — G894 Chronic pain syndrome: Secondary | ICD-10-CM | POA: Diagnosis not present

## 2024-06-21 DIAGNOSIS — M79671 Pain in right foot: Secondary | ICD-10-CM | POA: Insufficient documentation

## 2024-06-21 DIAGNOSIS — Z79899 Other long term (current) drug therapy: Secondary | ICD-10-CM | POA: Diagnosis not present

## 2024-06-21 DIAGNOSIS — M79641 Pain in right hand: Secondary | ICD-10-CM | POA: Insufficient documentation

## 2024-06-21 DIAGNOSIS — R2689 Other abnormalities of gait and mobility: Secondary | ICD-10-CM | POA: Insufficient documentation

## 2024-06-21 DIAGNOSIS — M6281 Muscle weakness (generalized): Secondary | ICD-10-CM | POA: Insufficient documentation

## 2024-06-21 DIAGNOSIS — M79642 Pain in left hand: Secondary | ICD-10-CM | POA: Diagnosis not present

## 2024-06-21 DIAGNOSIS — M5459 Other low back pain: Secondary | ICD-10-CM | POA: Diagnosis not present

## 2024-06-21 DIAGNOSIS — M4317 Spondylolisthesis, lumbosacral region: Secondary | ICD-10-CM | POA: Diagnosis not present

## 2024-06-21 DIAGNOSIS — R2681 Unsteadiness on feet: Secondary | ICD-10-CM | POA: Insufficient documentation

## 2024-06-21 DIAGNOSIS — M545 Low back pain, unspecified: Secondary | ICD-10-CM | POA: Insufficient documentation

## 2024-06-21 DIAGNOSIS — M25562 Pain in left knee: Secondary | ICD-10-CM | POA: Diagnosis not present

## 2024-06-21 DIAGNOSIS — R262 Difficulty in walking, not elsewhere classified: Secondary | ICD-10-CM | POA: Diagnosis not present

## 2024-06-21 DIAGNOSIS — M25561 Pain in right knee: Secondary | ICD-10-CM | POA: Diagnosis not present

## 2024-06-21 DIAGNOSIS — M4310 Spondylolisthesis, site unspecified: Secondary | ICD-10-CM | POA: Diagnosis not present

## 2024-06-21 DIAGNOSIS — Z79891 Long term (current) use of opiate analgesic: Secondary | ICD-10-CM | POA: Diagnosis not present

## 2024-06-21 DIAGNOSIS — M25512 Pain in left shoulder: Secondary | ICD-10-CM | POA: Diagnosis not present

## 2024-06-21 DIAGNOSIS — G8929 Other chronic pain: Secondary | ICD-10-CM | POA: Diagnosis not present

## 2024-06-21 DIAGNOSIS — M79672 Pain in left foot: Secondary | ICD-10-CM | POA: Insufficient documentation

## 2024-06-21 DIAGNOSIS — M170 Bilateral primary osteoarthritis of knee: Secondary | ICD-10-CM | POA: Insufficient documentation

## 2024-06-21 MED ORDER — OXYCODONE-ACETAMINOPHEN 5-325 MG PO TABS
1.0000 | ORAL_TABLET | Freq: Three times a day (TID) | ORAL | 0 refills | Status: DC
Start: 2024-08-25 — End: 2024-10-03

## 2024-06-21 MED ORDER — OXYCODONE-ACETAMINOPHEN 5-325 MG PO TABS: 1.0000 | ORAL_TABLET | Freq: Three times a day (TID) | ORAL | 0 refills | Status: DC

## 2024-06-21 MED ORDER — GABAPENTIN 800 MG PO TABS: 800.0000 mg | ORAL_TABLET | Freq: Three times a day (TID) | ORAL | 2 refills | Status: DC

## 2024-06-21 NOTE — Therapy (Unsigned)
 OUTPATIENT PHYSICAL THERAPY NEURO treatment/ RECERT       Patient Name: Gabriella Marsh MRN: [id number] DOB:[birth year], 58 y.o., female Today's Date: 06/21/2024   PCP: Donzella Lauraine SAILOR, DO REFERRING PROVIDER: Laurence Prentice DASEN, MD  END OF SESSION:  PT End of Session - 06/21/24 1632     Visit Number 16    Number of Visits 25    Date for PT Re-Evaluation 09/04/24    Progress Note Due on Visit 20    PT Start Time 1620    PT Stop Time 1700    PT Time Calculation (min) 40 min    Equipment Utilized During Treatment Gait belt    Activity Tolerance Patient tolerated treatment well    Behavior During Therapy WFL for tasks assessed/performed            Past Medical History:  Diagnosis Date   Arthritis    knees   Blood clotting disorder (HCC)    on toe    Degenerative disc disease, lumbar    Diabetes mellitus without complication (HCC)    GERD (gastroesophageal reflux disease)    Headache    daily - AM (has not been able to have SPG blocks lately)   Hyperlipidemia    Hypertension    Neuropathy    feet   Vertigo    3-4x/yr   Past Surgical History:  Procedure Laterality Date   ABDOMINAL HYSTERECTOMY     But still has cervix   AMPUTATION TOE Right 07/24/2021   Procedure: AMPUTATION TOE;  Surgeon: Neill Boas, DPM;  Location: [facility] ORS;  Service: Podiatry;  Laterality: Right;   CESAREAN SECTION     CHOLECYSTECTOMY     COLONOSCOPY WITH PROPOFOL  N/A 02/20/2022   Procedure: COLONOSCOPY WITH PROPOFOL ;  Surgeon: Therisa Bi, MD;  Location: [facility] ENDOSCOPY;  Service: Gastroenterology;  Laterality: N/A;   left arm frature  10/12/2020   OVARIAN CYST SURGERY     PARATHYROIDECTOMY  03/28/2021   Procedure: PARATHYROIDECTOMY AUTOTRANSPLANT;  Surgeon: Marolyn Nest, MD;  Location: [facility] ORS;  Service: General;;   right femur surgery Right    SHOULDER SURGERY Left 12/26/014   Dr. CECILE Glenn, [facility]   THYROIDECTOMY N/A 03/28/2021   Procedure: THYROIDECTOMY, total;  Surgeon:  Marolyn Nest, MD;  Location: [facility] ORS;  Service: General;  Laterality: N/A;  Provider requesting 3 hours /180 minutes for procedure   TONSILLECTOMY AND ADENOIDECTOMY     UTERINE FIBROID SURGERY     Patient Active Problem List   Diagnosis Date Noted   Neuropathy 04/21/2024   Lymphedema 04/21/2024   Wheelchair dependence 03/10/2023   Hypertension associated with diabetes (HCC) 03/10/2023   AKI (acute kidney injury) (HCC) 01/07/2023   Neurogenic bladder 01/07/2023   FTT (failure to thrive) in adult 12/09/2022   Primary insomnia 09/10/2022   Impaired functional mobility, balance, gait, and endurance 06/09/2022   Type 2 diabetes mellitus with diabetic neuropathy, with long-term current use of insulin  (HCC) 04/24/2022   Hypothyroidism 04/24/2022   Hyperlipidemia associated with type 2 diabetes mellitus (HCC) 04/24/2022   Iron deficiency anemia 04/24/2022   Chronic sensorimotor polyneuropathy with axonal and demyelinating features 01/12/2022   Osteoarthritis of knees (Bilateral) 01/12/2022   Tricompartment osteoarthritis of knees (Bilateral) 01/12/2022    Grade 1 Retrolisthesis (9mm) of L5 over S1 01/12/2022   History of proximal humerus fracture (Left) 01/12/2022   Vitamin D  deficiency 01/12/2022   Chronic peripheral neuropathic pain 01/12/2022   Disorder of skeletal system 11/12/2021   Problems influencing health  status 11/12/2021   Chronic hand pain (1ry area of Pain) (Bilateral) (L>R) 11/12/2021   Chronic feet pain (3ry area of Pain) (Bilateral) (L>R) 11/12/2021   Chronic shoulder pain (4th area of Pain) (Left) 11/12/2021   Chronic knee pain (5th area of Pain) (Bilateral) (L>R) 11/12/2021   Lumbar facet syndrome (Bilateral) 11/12/2021   COVID-19 virus infection 07/22/2021   Chronic pain syndrome 07/17/2021   S/P total thyroidectomy 03/28/2021   Thyroid  nodule    Schamberg's purpura 10/09/2019   Common migraine with intractable migraine 06/18/2015   DDD (degenerative disc  disease), lumbosacral 05/03/2015   Insomnia due to medical condition 05/03/2015   Disorder of peripheral nervous system 05/03/2015   Diabetic peripheral neuropathy (HCC) 03/06/2015   Obesity 03/06/2015   GERD (gastroesophageal reflux disease) 03/06/2015   Multinodular goiter 03/06/2015   RLS (restless legs syndrome) 03/06/2015   Chronic low back pain (2ry area of Pain) (Bilateral) (L>R) w/o sciatica 03/06/2015    ONSET DATE: 06/18/22  REFERRING DIAG: S72.91XD (ICD-10-CM) - Unspecified fracture of right femur, subsequent encounter for closed fracture with routine healing   THERAPY DIAG:  Difficulty in walking, not elsewhere classified  Muscle weakness (generalized)  Other abnormalities of gait and mobility  Unsteadiness on feet  Other low back pain  Rationale for Evaluation and Treatment: Rehabilitation  SUBJECTIVE:                                                                                                                                                                                             SUBJECTIVE STATEMENT:  Pt reports that she is doing well. Is walking in the house without AFOs.  Mild/moderate pain in the low back over the weekend, but improved on this day.    Pt accompanied by: Spouse   PERTINENT HISTORY: Pt is a pleasant 58 y/o female known to PT clinic, returning for impairments due to R femur fx sustained in 2023. Pt hx includes fall in June 2023 where pt suffered R femur fx followed by surgery where pt had plate and screws placed in R knee and rod in R femur. At the time pt with hx of multiple falls, now pt reports no falls in last 6 months. Since previous d/c from PT on 05/13/2023, pt has been consistently going to [facility] clinic 1x/week, and now returns to [facility] for new year. Pt reports she ambulated with RW while at [facility] clinic, has been able to perform STS to RW from higher surfaces. She would like to continue working on her mobility and strength, including  both UE and LE strength. She still struggles with putting on her shoes  and socks and getting from her bed to the commode. She reports standing at [facility] also limited due to chronic low back pain. She reports she'll likely get injections to tx LBP in future. Pt still performing previous HEP given, typically she completes 1 set of each exercise for 25-30 reps. Says this is easy. PMH per chart significant for arthritis, blood clotting disorder, DDD (lumbar), DM, headache, HTN, neuropathy, vertigo   PAIN:  Are you having pain? Yes: NPRS scale: not numerically rated Pain location: low back, bilat hand pain hurts a lot all the time  Pain description:   Aggravating factors: standing limited due to LBP in position Relieving factors: wearing gloves helps with hand pain  PRECAUTIONS: Fall  RED FLAGS: None   WEIGHT BEARING RESTRICTIONS: No  FALLS: Has patient fallen in last 6 months? No  LIVING ENVIRONMENT: via chart, pt confirms the following is still correct Lives with: lives with their family and lives with their spouse Lives in: House/apartment Stairs: No Has following equipment at home: Environmental consultant - 2 wheeled, Wheelchair (manual), Shower bench, bed side commode, and Grab bars  PLOF: Needs assistance with ADLs  PATIENT GOALS: Get out from bed to walker to bedside commode with or without LE braces donned, improve strength in UE/LE  OBJECTIVE:  Note: Objective measures were completed at Evaluation unless otherwise noted.  DIAGNOSTIC FINDINGS:   Via chart [facility] care 05/13/2023 XR Femur 2 views right: Impression  Unchanged position and alignment with further osseous bridging at the distal femur fracture following intramedullary rod fixation. And findings FINDINGS:  Position and alignment of the comminuted distal femur fracture are unchanged after placement of a retrograde interlocking intramedullary rod. Proximal and distal interlocking components articulate appropriately with the rod.  There is progressive osseous bridging with remodeling at the fracture site, there is no new fracture or dislocation.   COGNITION: Overall cognitive status: Within functional limits for tasks assessed   SENSATION: Reports impaired BLE, does have sensation on tops of thighs per report   COORDINATION: WFL UE rapid alt movement WFL LE with heel to shin, but unable to perform rapid alt movement due to weakness in bilat DFs  EDEMA:  Reports no swelling    POSTURE: rounded shoulders and forward head  LOWER EXTREMITY ROM:     Slight impairment with R ankle DF > L with PROM Slight impairment of L knee extension and somewhat painful    LOWER EXTREMITY MMT:    Grossly 4/5 in BLE exception is pt unable to complete DF bilat, other areas of significant mm deficit include hip flexors, abductors and hamstrings bilat  TRANSFERS:  Attempted STS to RW from WC during eval. Unable to perform from wheelchair even with max assist. Pt reports she has been able to complete STS from higher surfaces and this is how she practiced in [facility]  Assistive device utilized: Walker - 2 wheeled  Sit to stand: unable from standard chair height/WC see above  FUNCTIONAL TESTS:  Attempted STS from [facility], unable at this time. Will attempt functional test like 30 sec STS in future as pt able  PATIENT SURVEYS:  LEFS 50/80 FOTO 54  TREATMENT DATE: 12/29/2023  Sit<>stand x 6+5 with BUE to push into standing CGA-min assist on first bout and min-mod assist from PT due to fatigue on second bout.   Gait with RW x 85ft with CGA for safety.   Standing  Side stepping at rail 3 x 61ft bil with BUE support  March with BUE support x 6 bil   Hip extension x 6  Static standing to move pieces x 2 min until pt reports mild LBP   Seated:  Ankle DF x 12   Throughout session, PT provided CGA for  safety while in standing with min assist for transfers as listed above. Remained attentive to pt needs and provided multiple seated therapeutic rest breaks as needed   PATIENT EDUCATION: Education details: assessment findings, goals, plan, HEP. WC planning HEP expansion  Pt educated throughout session about proper posture and technique with exercises. Improved exercise technique, movement at target joints, use of target muscles after min to mod verbal, visual, tactile cues.  Person educated: Patient and Spouse Education method: Explanation, Verbal cues, and Handouts Education comprehension: verbalized understanding and returned demonstration  HOME EXERCISE PROGRAM: Access Code: [email or web address] URL: [email or web address] Date: 12/29/2023 Prepared by: Darryle Patten  Exercises - Seated Hamstring Curls with Resistance  - 1 x daily - 5-6 x weekly - 3-4 sets - 20-25 reps - Seated Hip Abduction with Resistance  - 1 x daily - 5-6 x weekly - 3-4 sets - 20-25 reps - Seated March  - 1 x daily - 5-6 x weekly - 3-4 sets - 20-25 reps   Access Code: [email or web address] URL: [email or web address] Date: 01/26/2024 Prepared by: Massie Dollar  Exercises - Supine Heel Slide  - 1 x daily - 7 x weekly - 3 sets - 10 reps - Supine Straight Leg Raises  - 1 x daily - 7 x weekly - 3 sets - 10 reps - Hooklying Clamshell with Resistance  - 1 x daily - 7 x weekly - 3 sets - 10 reps - Supine Hip Flexion  - 1 x daily - 7 x weekly - 3 sets - 10 reps - Supine Single Leg Ankle Pumps  - 1 x daily - 7 x weekly - 3 sets - 10 reps - Supine Bridge  - 1 x daily - 7 x weekly - 3 sets - 10 reps GOALS: Goals reviewed with patient? Yes  SHORT TERM GOALS: Target date: 02/09/2024   Patient will be independent in home exercise program to improve strength/mobility for better functional independence with ADLs. Baseline: initiated Goal status: INITIAL    LONG TERM GOALS: Target date:  09/04/2024     Patient  will perform sit to stand with min-mod assist from standard chair height in order to indicate improved lower extremity functional strength and improved ability to perform functional transfers and functional leg activities within her home.  Baseline: mod assist with +2 for safety.  4/2: mod assist 6/23: CGA from wheelchair  Goal status: Partially met   2.  Pt will improve LEFS by 10 points or more in order to indicate improved LE function and mobility.  Baseline: 50 4/2: 32 Goal status: INITIAL  3.  Patient will increase FOTO score to equal to or greater than  59   to demonstrate statistically significant improvement in mobility and quality of life.  Baseline: 54 Goal status: goal d/c'ed.   4.  Pt will require no more than min assist for transfer from bed to bedside commode Baseline: supervision assist with  slide board transfer. To mat table  Using walker for transfer to [facility] with mod assist for safety and  6/23: husband helps supervise and set up with CGA Goal status: MET  5.  The pt will demo ability to don socks and shoes indep or mod I for increased ease with dressing and improved independence Baseline: required max assist from husband  4/2: pt states that she is donning night socks. Is still requiring max assist for LLE and moderate assist for the RLE  6/23; husband requires max A for socks and shoes Goal status: Progressing  6.  The pt will demo ability to complete 30 second STS test to indicate increased ease with transfers and BLE strength Baseline: 2 sit<>stand from [facility] with cushion in place and  +2 for safety.  4/2: 3 with mod assist and +2 to stabilize RW  6/23: 3 with CGA; +2 to stabilize RW without brace:  With KAFOs on: 3.5 with CGA; +2 to stabilize  Goal status: progressing   7.  Patient will increase 10 meter walk test to >1.60m/s as to improve gait speed for better community ambulation and to reduce fall risk. Baseline: 5/23: 2 min 2 seconds with orthotics on; 1 min 7  seconds without orthotics with wheelchair follow and BrW.  Goal status: NEW   ASSESSMENT:  CLINICAL IMPRESSION:  PT performs well throughout session. Requesting to walk without AFOs on this day. Still greatly limited in active DF in BLE, able to achieve flat foot contact on the RLE intermittently. Encouraged pt to reach out to hanger to remove knee locking component from R side KAFO to allow improved ankle control, but allow knee swing in gait. Continued to address standing tolerance and closed/open chain strengthening to improve functional use of BLE.  The pt will benefit from further skilled PT to improve these impairments in order to increase ease with ADLs, QOL and to reduce fall risk.   OBJECTIVE IMPAIRMENTS: Abnormal gait, decreased activity tolerance, decreased balance, decreased coordination, decreased endurance, decreased mobility, difficulty walking, decreased ROM, decreased strength, hypomobility, impaired sensation, improper body mechanics, postural dysfunction, and pain.   ACTIVITY LIMITATIONS: carrying, lifting, bending, standing, squatting, stairs, transfers, bed mobility, bathing, toileting, dressing, and locomotion level  PARTICIPATION LIMITATIONS: meal prep, cleaning, laundry, driving, shopping, community activity, and yard work  PERSONAL FACTORS: Fitness, Sex, Time since onset of injury/illness/exacerbation, and 3+ comorbidities: PMH per chart significant for arthritis, blood clotting disorder, DDD (lumbar), DM, headache, HTN, neuropathy, vertigo are also affecting patient's functional outcome.   REHAB POTENTIAL: Good  CLINICAL DECISION MAKING: Evolving/moderate complexity  EVALUATION COMPLEXITY: Moderate  PLAN:  PT FREQUENCY: 1-2x/week  PT DURATION: 12 weeks  PLANNED INTERVENTIONS: 97164- PT Re-evaluation, 97110-Therapeutic exercises, 97530- Therapeutic activity, [id number]- Neuromuscular re-education, 97535- Self Care, [id number]- Manual therapy, [id number]- Gait training, [phone]-  Orthotic Fit/training, [id number]- Canalith repositioning, [id number]- Electrical stimulation (manual), [id number]- Ultrasound, Patient/Family education, Balance training, Stair training, Taping, Dry Needling, Joint mobilization, Spinal mobilization, Vestibular training, DME instructions, Wheelchair mobility training, Cryotherapy, and Moist heat  PLAN FOR NEXT SESSION:   Continue BLE strengthening.  Gait as tolerated with bracing as appropriate.   Massie FORBES Dollar, PT 06/21/2024, 4:34 PM

## 2024-06-21 NOTE — Progress Notes (Addendum)
 Nursing Pain Medication Assessment:  Safety precautions to be maintained throughout the outpatient stay will include: orient to surroundings, keep bed in low position, maintain call bell within reach at all times, provide assistance with transfer out of bed and ambulation.   Medication Inspection Compliance: Pill count conducted under aseptic conditions, in front of the patient. Neither the pills nor the bottle was removed from the patient's sight at any time. Once count was completed pills were immediately returned to the patient in their original bottle.  Medication: Oxycodone /APAP Pill/Patch Count: 29 of 90 pills/patches remain Pill/Patch Appearance: Markings consistent with prescribed medication Bottle Appearance: Standard pharmacy container. Clearly labeled. Filled Date: 06 / 06 / 2025 Last Medication intake:  Today

## 2024-06-21 NOTE — Progress Notes (Signed)
 PROVIDER NOTE: Interpretation of information contained herein should be left to medically-trained personnel. Specific patient instructions are provided elsewhere under Patient Instructions section of medical record. This document was created in part using AI and STT-dictation technology, any transcriptional errors that may result from this process are unintentional.  Patient: Gabriella Marsh  Service: E/M   PCP: Donzella Lauraine SAILOR, DO  DOB: 02/21/1966  DOS: 06/21/2024  Provider: Emmy MARLA Blanch, NP  MRN: [id number]  Delivery: Face-to-face  Specialty: Interventional Pain Management  Type: Established Patient  Setting: Ambulatory outpatient facility  Specialty designation: 09  Referring Prov.: Donzella Lauraine SAILOR, DO  Location: Outpatient office facility       History of present illness (HPI) [name], a 58 y.o. year old female, is here today because of her No primary diagnosis found.. [name] primary complain today is Back Pain  Pertinent problems: [name] has Diabetic peripheral neuropathy (HCC); RLS (restless leg syndrome); Chronic low back pain (2ry area of pain) (Bilateral) (L>R) w/o sciatica; DDD (degenerative disc disease), lumbosacral; Disorder of peripheral nervous system; Disorder of skeletal system; and Chronic pain syndrome on their pertinent problem list.   Pain Assessment: Severity of Chronic pain is reported as a 4 /10. Location: Back Lower, Medial/Patient is interested in procedure for low medial lumbar pain. Radiates from lower side down to left leg down to outer thigh.. Onset: More than a month ago. Quality: Aching, Constant, Dull. Timing: Constant. Modifying factor(s): Sitting, tylenol , and pain medication. Vitals:  height is 5' 9 (1.753 m) and weight is 245 lb (111.1 kg). Her temporal temperature is 97.2 F (36.2 C) (abnormal). Her blood pressure is 135/74 and her pulse is 73. Her respiration is 16 and oxygen saturation is 96%.  BMI: Estimated body mass index  is 36.18 kg/m as calculated from the following:   Height as of this encounter: 5' 9 (1.753 m).   Weight as of this encounter: 245 lb (111.1 kg).  Last encounter: Visit date not found. Last procedure: Visit date not found.  Reason for encounter: medication management. No change in medical history since last visit.  Patient's pain is at baseline.  Patient continues multimodal pain regimen as prescribed.  States that it provides pain relief and improvement in functional status.  Pharmacotherapy Assessment   Oxycodone -acetaminophen  (Percocet) 5-32/3255 MG tablet every 8 hours as needed for pain. MME=20 Gabapentin  (Neurontin ) 800 mg 3 times daily  Monitoring: [place] PMP: PDMP reviewed during this encounter.       Pharmacotherapy: No side-effects or adverse reactions reported. Compliance: No problems identified. Effectiveness: Clinically acceptable.  Bonner Norris, RN  06/21/2024  2:10 PM  Sign when Signing Visit Nursing Pain Medication Assessment:  Safety precautions to be maintained throughout the outpatient stay will include: orient to surroundings, keep bed in low position, maintain call bell within reach at all times, provide assistance with transfer out of bed and ambulation.   Medication Inspection Compliance: Pill count conducted under aseptic conditions, in front of the patient. Neither the pills nor the bottle was removed from the patient's sight at any time. Once count was completed pills were immediately returned to the patient in their original bottle.  Medication: Oxycodone /APAP Pill/Patch Count: 29 of 90 pills/patches remain Pill/Patch Appearance: Markings consistent with prescribed medication Bottle Appearance: Standard pharmacy container. Clearly labeled. Filled Date: 06 / 06 / 2025 Last Medication intake:  Today      UDS:  Summary  Date Value Ref Range Status  07/06/2023 Note  Final  Comment:     ==================================================================== Tox[employer]SU[employer] Select 13 (MW) ==================================================================== Test                             Result       Flag       Units  Drug Present and Declared for Prescription Verification   Oxycodone                       614          EXPECTED   ng/mg creat   Oxymorphone                    223          EXPECTED   ng/mg creat   Noroxycodone                   1181         EXPECTED   ng/mg creat   Noroxymorphone                 273          EXPECTED   ng/mg creat    Sources of oxycodone  are scheduled prescription medications.    Oxymorphone, noroxycodone, and noroxymorphone are expected    metabolites of oxycodone . Oxymorphone is also available as a    scheduled prescription medication.  ==================================================================== Test                      Result    Flag   Units      Ref Range   Creatinine              244              mg/dL      >=79 ==================================================================== Declared Medications:  The flagging and interpretation on this report are based on the  following declared medications.  Unexpected results may arise from  inaccuracies in the declared medications.   **Note: The testing scope of this panel includes these medications:   Oxycodone  (Percocet)   **Note: The testing scope of this panel does not include the  following reported medications:   Acetaminophen  (Tylenol )  Acetaminophen  (Percocet)  Atorvastatin  (Lipitor)  Clopidogrel  (Plavix )  Cyclobenzaprine  (Flexeril )  Fluticasone  (Flonase )  Gabapentin   Insulin  (Lantus )  Levothyroxine  (Synthroid )  Lisinopril  (Zestril )  Melatonin  Metformin   Nortriptyline  (Pamelor )  Omeprazole  (Prilosec)  Semaglutide   Trazodone  (Desyrel ) ==================================================================== For clinical consultation, please call [phone]. ====================================================================     No results found for: CBDTHCR No results found for: D8THCCBX No results found for: D9THCCBX  ROS  Constitutional: Denies any fever or chills Gastrointestinal: No reported hemesis, hematochezia, vomiting, or acute GI distress Musculoskeletal: Lower back pain Neurological: No reported episodes of acute onset apraxia, aphasia, dysarthria, agnosia, amnesia, paralysis, loss of coordination, or loss of consciousness  Medication Review  Dulaglutide , INSULIN  SYRINGE 1CC/31GX5/16, Insulin  Pen Needle, Melatonin, Vitamin D  (Ergocalciferol ), acetaminophen , atorvastatin , cyclobenzaprine , fluticasone , furosemide , gabapentin , glucose blood, insulin  aspart, insulin  glargine-yfgn, levothyroxine , lisinopril , metFORMIN , nortriptyline , nystatin -triamcinolone  ointment, omeprazole , oxyCODONE -acetaminophen , and traZODone   History Review  Allergy: [name] is allergic to celecoxib, pregabalin, buspar  [buspirone ], citalopram , and other. Drug: [name]  reports no history of drug use. Alcohol:  reports no history of alcohol use. Tobacco:  reports that she quit smoking about 12 years ago. Her smoking use included cigarettes. She has never used smokeless tobacco. Social: [name]  reports that she quit smoking about 12 years ago. Her smoking use included cigarettes. She has never used smokeless tobacco. She reports that she does not drink alcohol and does not use drugs. Medical:  has a past medical history of Arthritis, Blood clotting disorder (HCC), Degenerative disc disease, lumbar, Diabetes mellitus without complication (HCC), GERD (gastroesophageal reflux disease), Headache, Hyperlipidemia, Hypertension, Neuropathy, and Vertigo. Surgical: [name]  has a past surgical history that includes Cesarean section; Shoulder surgery (Left, 12/26/014); Abdominal hysterectomy; Tonsillectomy and adenoidectomy; Uterine fibroid  surgery; Ovarian cyst surgery; Cholecystectomy; left arm frature (10/12/2020); Thyroidectomy (N/A, 03/28/2021); Parathyroidectomy (03/28/2021); Amputation toe (Right, 07/24/2021); Colonoscopy with propofol  (N/A, 02/20/2022); and right femur surgery (Right). Family: family history includes Congestive Heart Failure in her father; Diabetes in her father; Healthy in her brother; Heart disease in her father; Hyperlipidemia in her father; Hypertension in her father; Irritable bowel syndrome in her mother; Osteoporosis in her maternal grandmother and mother.  Laboratory Chemistry Profile   Renal Lab Results  Component Value Date   BUN 16 04/21/2024   CREATININE 0.68 04/21/2024   BCR 24 (H) 04/21/2024   GFR 111.18 06/05/2015   GFRAA 92 01/03/2021   GFRNONAA >60 04/04/2024    Hepatic Lab Results  Component Value Date   AST 13 04/21/2024   ALT 15 04/21/2024   ALBUMIN 4.2 04/21/2024   ALKPHOS 76 04/21/2024   LIPASE 29 04/04/2024    Electrolytes Lab Results  Component Value Date   NA 133 (L) 04/21/2024   K 5.4 (H) 04/21/2024   CL 92 (L) 04/21/2024   CALCIUM  8.7 04/21/2024   MG 1.8 01/09/2023    Bone Lab Results  Component Value Date   VD25OH 13.8 (L) 04/21/2024   25OHVITD1 14 (L) 11/12/2021   25OHVITD2 <1.0 11/12/2021   25OHVITD3 13 11/12/2021    Inflammation (CRP: Acute Phase) (ESR: Chronic Phase) Lab Results  Component Value Date   CRP 4 11/12/2021   ESRSEDRATE 39 11/12/2021   LATICACIDVEN 2.9 (HH) 01/07/2023         Note: Above Lab results reviewed.  Recent Imaging Review  CT ABDOMEN PELVIS W CONTRAST CLINICAL DATA:  Right lower quadrant abdominal pain.  EXAM: CT ABDOMEN AND PELVIS WITH CONTRAST  TECHNIQUE: Multidetector CT imaging of the abdomen and pelvis was performed using the standard protocol following bolus administration of intravenous contrast.  RADIATION DOSE REDUCTION: This exam was performed according to the departmental dose-optimization program  which includes automated exposure control, adjustment of the mA and/or kV according to patient size and/or use of iterative reconstruction technique.  CONTRAST:  OMNIPAQUE  IOHEXOL  300 MG/ML  SOLN  COMPARISON:  None Available.  FINDINGS: Lower chest: No acute abnormality.  Hepatobiliary: The liver is mildly enlarged. There is a hypodensity in the inferior right lobe of the liver which is too small to characterize measuring 7 mm image 2/37. Gallbladder surgically absent. There is no biliary ductal dilatation.  Pancreas: Unremarkable. No pancreatic ductal dilatation or surrounding inflammatory changes.  Spleen: Normal in size without focal abnormality.  Adrenals/Urinary Tract: Bladder is distended. The bladder otherwise appears within normal limits. The bilateral kidneys and adrenal glands are within normal limits.  Stomach/Bowel: Stomach is within normal limits. Appendix appears normal. No evidence of bowel wall thickening, distention, or inflammatory changes.  Vascular/Lymphatic: Aortic atherosclerosis. No enlarged abdominal or pelvic lymph nodes.  Reproductive: Uterus and bilateral adnexa are unremarkable.  Other: There is some subcutaneous stranding in the lower anterior abdominal wall. No fluid collection. No significant  abdominal wall hernia.  Musculoskeletal: There is mild chronic appearing compression deformity of L1. There are degenerative changes at L5-S1.  IMPRESSION: 1. No acute localizing process in the abdomen or pelvis. 2. Distended bladder. 3. Mild hepatomegaly. 4. Subcutaneous stranding in the lower anterior abdominal wall, nonspecific. 5. Aortic atherosclerosis.  Electronically Signed   By: Greig Pique M.D.   On: 04/04/2024 18:13 US  Venous Img Lower Unilateral Right CLINICAL DATA:  Right lower extremity pain and edema  EXAM: RIGHT LOWER EXTREMITY VENOUS DOPPLER ULTRASOUND  TECHNIQUE: Gray-scale sonography with graded compression, as  well as color Doppler and duplex ultrasound were performed to evaluate the lower extremity deep venous systems from the level of the common femoral vein and including the common femoral, femoral, profunda femoral, popliteal and calf veins including the posterior tibial, peroneal and gastrocnemius veins when visible. Spectral Doppler was utilized to evaluate flow at rest and with distal augmentation maneuvers in the common femoral, femoral and popliteal veins.  COMPARISON:  None Available.  FINDINGS: Contralateral Common Femoral Vein: Respiratory phasicity is normal and symmetric with the symptomatic side. No evidence of thrombus. Normal compressibility.  Common Femoral Vein: No evidence of thrombus. Normal compressibility, respiratory phasicity and response to augmentation.  Saphenofemoral Junction: No evidence of thrombus. Normal compressibility and flow on color Doppler imaging.  Profunda Femoral Vein: No evidence of thrombus. Normal compressibility and flow on color Doppler imaging.  Femoral Vein: No evidence of thrombus. Normal compressibility, respiratory phasicity and response to augmentation.  Popliteal Vein: No evidence of thrombus. Normal compressibility, respiratory phasicity and response to augmentation.  Calf Veins: No evidence of thrombus. Normal compressibility and flow on color Doppler imaging.  IMPRESSION: No evidence of deep venous thrombosis.  Electronically Signed   By: CHRISTELLA.  Shick M.D.   On: 04/04/2024 14:51 Note: Reviewed        Physical Exam  Vitals: BP 135/74 (Patient Position: Sitting, Cuff Size: Normal)   Pulse 73   Temp (!) 97.2 F (36.2 C) (Temporal)   Resp 16   Ht 5' 9 (1.753 m)   Wt 245 lb (111.1 kg)   SpO2 96%   BMI 36.18 kg/m  BMI: Estimated body mass index is 36.18 kg/m as calculated from the following:   Height as of this encounter: 5' 9 (1.753 m).   Weight as of this encounter: 245 lb (111.1 kg). Ideal: Ideal body weight: 66.2  kg (145 lb 15.1 oz) Adjusted ideal body weight: 84.2 kg (185 lb 9.1 oz) General appearance: Well nourished, well developed, and well hydrated. In no apparent acute distress Mental status: Alert, oriented x 3 (person, place, & time)       Respiratory: No evidence of acute respiratory distress Eyes: PERLA Assessment   Diagnosis Status  1. Chronic pain syndrome   2. Osteoarthritis of knees (Bilateral)   3. Pharmacologic therapy   4. Chronic shoulder pain (4th area of Pain) (Left)   5.  Grade 1 Retrolisthesis (9mm) of L5 over S1   6. Chronic hand pain (1ry area of Pain) (Bilateral) (L>R)   7. Encounter for medication management   8. Encounter for chronic pain management   9. Chronic use of opiate for therapeutic purpose   10. Chronic knee pain (5th area of Pain) (Bilateral) (L>R)   11. Tricompartment osteoarthritis of knees (Bilateral)   12. Chronic low back pain (2ry area of Pain) (Bilateral) (L>R) w/o sciatica   13. Chronic feet pain (3ry area of Pain) (Bilateral) (L>R)    Controlled Controlled  Controlled   Updated Problems: No problems updated.  Plan of Care  Problem-specific:  Assessment and Plan  We will continue on current medication regimen.  Prescribing drug monitoring (PDMP) reviewed; findings consistent with the use of prescribed medication and no evidence of narcotic misuse or abuse. Routine UDS ordered today.  No new issues or problems reported to this visit.  Schedule follow-up in 90 days for medication management.  Qutenza  was offered at the previous visit and again during this encounter for management of neuropathic pain; the patient is currently awaiting insurance approval, as she is in the process of transitioning to Medicare.   Medication:  gabapentin  (NEURONTIN ) 800 MG tablet         Sig: Take 1 tablet (800 mg total) by mouth 3 (three) times daily.      Dispense:  90 tablet      Refill:  2   oxyCODONE -acetaminophen  (PERCOCET) 5-325 MG tablet      Sig: Take  1 tablet by mouth every 8 (eight) hours. Must last 30 days.      Dispense:  90 tablet      Refill:  0      DO NOT: delete (not duplicate); no partial-fill (will deny script to complete), no refill request (F/U required). DISPENSE: 1 day early if closed on fill date. WARN: No CNS-depressants within 8 hrs of med.   oxyCODONE -acetaminophen  (PERCOCET) 5-325 MG tablet      Sig: Take 1 tablet by mouth every 8 (eight) hours. Must last 30 days.      Dispense:  90 tablet      Refill:  0      DO NOT: delete (not duplicate); no partial-fill (will deny script to complete), no refill request (F/U required). DISPENSE: 1 day early if closed on fill date. WARN: No CNS-depressants within 8 hrs of med.   oxyCODONE -acetaminophen  (PERCOCET) 5-325 MG tablet      Sig: Take 1 tablet by mouth every 8 (eight) hours. Must last 30 days.      Dispense:  90 tablet      Refill:  0      DO NOT: delete (not duplicate); no partial-fill (will deny script to complete), no refill request (F/U required). DISPENSE: 1 day early if closed on fill date. WARN: No CNS-depressants within 8 hrs of med.    [name] has a current medication list which includes the following long-term medication(s): atorvastatin , fluticasone , furosemide , insulin  aspart, insulin  pen needle, levothyroxine , lisinopril , metformin , nortriptyline , omeprazole , trazodone , gabapentin , [START ON 06/26/2024] oxycodone -acetaminophen , [START ON 07/26/2024] oxycodone -acetaminophen , and [START ON 08/25/2024] oxycodone -acetaminophen .  Pharmacotherapy (Medications Ordered): Meds ordered this encounter  Medications   oxyCODONE -acetaminophen  (PERCOCET) 5-325 MG tablet    Sig: Take 1 tablet by mouth every 8 (eight) hours. Must last 30 days.    Dispense:  90 tablet    Refill:  0    DO NOT: delete (not duplicate); no partial-fill (will deny script to complete), no refill request (F/U required). DISPENSE: 1 day early if closed on fill date. WARN: No CNS-depressants  within 8 hrs of med.   oxyCODONE -acetaminophen  (PERCOCET) 5-325 MG tablet    Sig: Take 1 tablet by mouth every 8 (eight) hours. Must last 30 days.    Dispense:  90 tablet    Refill:  0    DO NOT: delete (not duplicate); no partial-fill (will deny script to complete), no refill request (F/U required). DISPENSE: 1 day early if closed on fill date. WARN: No CNS-depressants  within 8 hrs of med.   oxyCODONE -acetaminophen  (PERCOCET) 5-325 MG tablet    Sig: Take 1 tablet by mouth every 8 (eight) hours. Must last 30 days.    Dispense:  90 tablet    Refill:  0    DO NOT: delete (not duplicate); no partial-fill (will deny script to complete), no refill request (F/U required). DISPENSE: 1 day early if closed on fill date. WARN: No CNS-depressants within 8 hrs of med.   gabapentin  (NEURONTIN ) 800 MG tablet    Sig: Take 1 tablet (800 mg total) by mouth 3 (three) times daily.    Dispense:  90 tablet    Refill:  2   Orders:  Orders Placed This Encounter  Procedures   ToxASSURE Select 13 (MW), Urine    Volume: 30 ml(s). Minimum 3 ml of urine is needed. Document temperature of fresh sample. Indications: Long term (current) use of opiate analgesic (S20.108)    Release to patient:   Immediate        Return in about 3 months (around 09/21/2024) for (F2F), (MM), Emmy Blanch NP.    Recent Visits Date Type Provider Dept  03/28/24 Office Visit Marcelino Nurse, MD [facility]  Showing recent visits within past 90 days and meeting all other requirements Today's Visits Date Type Provider Dept  06/21/24 Office Visit Jhonny Calixto K, NP [facility]  Showing today's visits and meeting all other requirements Future Appointments No visits were found meeting these conditions. Showing future appointments within next 90 days and meeting all other requirements  I discussed the assessment and treatment plan with the patient. The patient was provided an opportunity to ask questions and all were  answered. The patient agreed with the plan and demonstrated an understanding of the instructions.  Patient advised to call back or seek an in-person evaluation if the symptoms or condition worsens.  Duration of encounter: 30 minutes.  Total time on encounter, as per AMA guidelines included both the face-to-face and non-face-to-face time personally spent by the physician and/or other qualified health care professional(s) on the day of the encounter (includes time in activities that require the physician or other qualified health care professional and does not include time in activities normally performed by clinical staff). Physician's time may include the following activities when performed: Preparing to see the patient (e.g., pre-charting review of records, searching for previously ordered imaging, lab work, and nerve conduction tests) Review of prior analgesic pharmacotherapies. Reviewing PMP Interpreting ordered tests (e.g., lab work, imaging, nerve conduction tests) Performing post-procedure evaluations, including interpretation of diagnostic procedures Obtaining and/or reviewing separately obtained history Performing a medically appropriate examination and/or evaluation Counseling and educating the patient/family/caregiver Ordering medications, tests, or procedures Referring and communicating with other health care professionals (when not separately reported) Documenting clinical information in the electronic or other health record Independently interpreting results (not separately reported) and communicating results to the patient/ family/caregiver Care coordination (not separately reported)  Note by: Brocha Gilliam K Miho Monda, NP (TTS and AI technology used. I apologize for any typographical errors that were not detected and corrected.) Date: 06/21/2024; Time: 2:34 PM

## 2024-06-22 ENCOUNTER — Encounter: Payer: Self-pay | Admitting: Sleep Medicine

## 2024-06-22 ENCOUNTER — Ambulatory Visit: Admitting: Sleep Medicine

## 2024-06-22 VITALS — BP 130/80 | HR 82 | Temp 97.1°F | Ht 69.0 in | Wt 245.0 lb

## 2024-06-22 DIAGNOSIS — E119 Type 2 diabetes mellitus without complications: Secondary | ICD-10-CM | POA: Diagnosis not present

## 2024-06-22 DIAGNOSIS — E1140 Type 2 diabetes mellitus with diabetic neuropathy, unspecified: Secondary | ICD-10-CM

## 2024-06-22 DIAGNOSIS — I10 Essential (primary) hypertension: Secondary | ICD-10-CM | POA: Diagnosis not present

## 2024-06-22 DIAGNOSIS — G4733 Obstructive sleep apnea (adult) (pediatric): Secondary | ICD-10-CM

## 2024-06-22 DIAGNOSIS — Z87891 Personal history of nicotine dependence: Secondary | ICD-10-CM

## 2024-06-22 DIAGNOSIS — Z794 Long term (current) use of insulin: Secondary | ICD-10-CM | POA: Diagnosis not present

## 2024-06-22 NOTE — Patient Instructions (Signed)
 Gabriella Marsh

## 2024-06-22 NOTE — Progress Notes (Signed)
 Name:Gabriella Marsh MRN: [id number] DOB: 03/20/1966   CHIEF COMPLAINT:  EXCESSIVE DAYTIME SLEEPINESS   HISTORY OF PRESENT ILLNESS:  Gabriella Marsh is a 58 y.o. w/ a h/o DMII, hypothyroidism, HTN, hyperlipidemia, GERD and obesity who presents for c/o loud snoring and excessive daytime sleepiness which has been present for several years. Reports nocturnal awakenings due to unclear reasons and has difficulty falling back to sleep. Admits to a 20 weight gain over the last few years. Admits to dry mouth. Denies morning headaches, RLS symptoms, dream enactment, cataplexy, hypnagogic or hypnapompic hallucinations. Reports a family history of sleep apnea. Denies drowsy driving. Drinks several glasses of tea daily, denies alcohol or illicit drug use, former smoker.   Bedtime 9:30-10 pm Sleep onset 1 hour  Rise time 5:30 am   EPWORTH SLEEP SCORE 4    06/22/2024   11:00 AM  Results of the Epworth flowsheet  Sitting and reading 2  Watching TV 0  Sitting, inactive in a public place (e.g. a theatre or a meeting) 1  As a passenger in a car for an hour without a break 1  Lying down to rest in the afternoon when circumstances permit 0  Sitting and talking to someone 0  Sitting quietly after a lunch without alcohol 0  In a car, while stopped for a few minutes in traffic 0  Total score 4     PAST MEDICAL HISTORY :   has a past medical history of Arthritis, Blood clotting disorder (HCC), Degenerative disc disease, lumbar, Diabetes mellitus without complication (HCC), GERD (gastroesophageal reflux disease), Headache, Hyperlipidemia, Hypertension, Neuropathy, and Vertigo.  has a past surgical history that includes Cesarean section; Shoulder surgery (Left, 12/26/014); Abdominal hysterectomy; Tonsillectomy and adenoidectomy; Uterine fibroid surgery; Ovarian cyst surgery; Cholecystectomy; left arm frature (10/12/2020); Thyroidectomy (N/A, 03/28/2021); Parathyroidectomy (03/28/2021); Amputation  toe (Right, 07/24/2021); Colonoscopy with propofol  (N/A, 02/20/2022); and right femur surgery (Right). Prior to Admission medications   Medication Sig Start Date End Date Taking? Authorizing Provider  acetaminophen  (TYLENOL ) 500 MG tablet Take 1,000 mg by mouth every 12 (twelve) hours as needed for moderate pain.   Yes [provider]  atorvastatin  (LIPITOR) 20 MG tablet TAKE 1 TABLET(20 MG) BY MOUTH DAILY 09/14/23  Yes Emilio Marseille T, FNP  cyclobenzaprine  (FLEXERIL ) 5 MG tablet Take 1 tablet (5 mg total) by mouth 3 (three) times daily as needed for muscle spasms. 09/14/23  Yes Emilio Marseille T, FNP  Dulaglutide  (TRULICITY ) 1.5 MG/0.5ML SOAJ Inject 1.5 mg into the skin once a week. 05/24/24  Yes Pardue, Lauraine SAILOR, DO  fluticasone  (FLONASE ) 50 MCG/ACT nasal spray Place 2 sprays into both nostrils daily. 01/09/22  Yes Emilio Marseille DASEN, FNP  furosemide  (LASIX ) 20 MG tablet Take 0.5 tablets (10 mg total) by mouth daily as needed for edema. 06/12/24  Yes Pardue, Lauraine SAILOR, DO  gabapentin  (NEURONTIN ) 800 MG tablet Take 1 tablet (800 mg total) by mouth 3 (three) times daily. 06/21/24 09/19/24 Yes Patel, Seema K, NP  glucose blood (ACCU-CHEK GUIDE TEST) test strip Use to check blood sugar up to 3 times a day with insulin  use. Dx code: E11.41 05/02/24  Yes Pardue, Lauraine SAILOR, DO  insulin  aspart (NOVOLOG ) 100 UNIT/ML FlexPen Inject 20 units three times a day with meals with correction insulin  if needed. Hold if not eating. Max total daily dose of 75 units. 06/14/24  Yes Pardue, Lauraine SAILOR, DO  insulin  glargine-yfgn (SEMGLEE ) 100 UNIT/ML Pen Inject up to 75 units daily.  Titrate as directed by PCP. Dx code E11.40. Inactivate vial script. 06/05/24  Yes Pardue, Lauraine SAILOR, DO  insulin  glargine-yfgn (SEMGLEE , YFGN,) 100 UNIT/ML injection Inject up to 75 units daily. Titrate as directed by PCP. 05/25/24  Yes Pardue, Lauraine SAILOR, DO  Insulin  Pen Needle 30G X 5 MM MISC Use to inject insulin  4 times per day as prescribed. Dx code: E11.40 05/16/24   Yes Pardue, Lauraine SAILOR, DO  Insulin  Syringe-Needle U-100 (INSULIN  SYRINGE 1CC/31GX5/16) 31G X 5/16 1 ML MISC Use with Semglee  vial once a day. Dx code E11.40 06/05/24  Yes Pardue, Lauraine SAILOR, DO  levothyroxine  (SYNTHROID ) 112 MCG tablet Take 2 tablets (224 mcg total) by mouth daily. 10/12/23  Yes Emilio Kelly DASEN, FNP  lisinopril  (ZESTRIL ) 2.5 MG tablet Take 1 tablet (2.5 mg total) by mouth daily. 09/14/23  Yes Emilio Kelly T, FNP  Melatonin 10 MG TABS Take 10 mg by mouth at bedtime.   Yes [provider]  metFORMIN  (GLUCOPHAGE ) 500 MG tablet Take 2 tablets (1,000 mg total) by mouth 2 (two) times daily with a meal. 09/14/23  Yes Emilio Kelly DASEN, FNP  nortriptyline  (PAMELOR ) 50 MG capsule Take 1 capsule (50 mg total) by mouth at bedtime. 09/14/23  Yes Emilio Kelly DASEN, FNP  nystatin -triamcinolone  ointment (MYCOLOG) Apply 1 Application topically 2 (two) times daily. 02/24/24  Yes McGowan, Clotilda A, PA-C  omeprazole  (PRILOSEC) 40 MG capsule Take 1 capsule (40 mg total) by mouth daily. 06/12/24  Yes Pardue, Lauraine SAILOR, DO  oxyCODONE -acetaminophen  (PERCOCET) 5-325 MG tablet Take 1 tablet by mouth every 8 (eight) hours. Must last 30 days. 06/26/24 07/26/24 Yes Patel, Seema K, NP  oxyCODONE -acetaminophen  (PERCOCET) 5-325 MG tablet Take 1 tablet by mouth every 8 (eight) hours. Must last 30 days. 07/26/24 08/25/24 Yes Patel, Seema K, NP  oxyCODONE -acetaminophen  (PERCOCET) 5-325 MG tablet Take 1 tablet by mouth every 8 (eight) hours. Must last 30 days. 08/25/24 09/24/24 Yes Patel, Seema K, NP  traZODone  (DESYREL ) 100 MG tablet Take 1 tablet (100 mg total) by mouth at bedtime. 09/14/23  Yes Emilio Kelly T, FNP  Vitamin D , Ergocalciferol , (DRISDOL ) 1.25 MG (50000 UNIT) CAPS capsule Take 1 capsule (50,000 Units total) by mouth every 7 (seven) days. 05/05/24  Yes Pardue, Lauraine SAILOR, DO   Allergies  Allergen Reactions   Celecoxib Shortness Of Breath   Pregabalin Shortness Of Breath   Buspar  [Buspirone ] Other (See Comments)     Tachycardia   Citalopram  Cough   Other Cough    [place] pear trees    FAMILY HISTORY:  family history includes Congestive Heart Failure in her father; Diabetes in her father; Healthy in her brother; Heart disease in her father; Hyperlipidemia in her father; Hypertension in her father; Irritable bowel syndrome in her mother; Osteoporosis in her maternal grandmother and mother. SOCIAL HISTORY:  reports that she quit smoking about 12 years ago. Her smoking use included cigarettes. She has never used smokeless tobacco. She reports that she does not drink alcohol and does not use drugs.   Review of Systems:  Gen:  Denies  fever, sweats, chills weight loss  HEENT: Denies blurred vision, double vision, ear pain, eye pain, hearing loss, nose bleeds, sore throat Cardiac:  No dizziness, chest pain or heaviness, chest tightness,edema, No JVD Resp:   No cough, -sputum production, -shortness of breath,-wheezing, -hemoptysis,  Gi: Denies swallowing difficulty, stomach pain, nausea or vomiting, diarrhea, constipation, bowel incontinence Gu:  Denies bladder incontinence, burning urine Ext:   Denies Joint pain,  stiffness or swelling Skin: Denies  skin rash, easy bruising or bleeding or hives Endoc:  Denies polyuria, polydipsia , polyphagia or weight change Psych:   Denies depression, insomnia or hallucinations  Other:  All other systems negative  VITAL SIGNS: BP 130/80 (BP Location: Right Arm, Cuff Size: Normal)   Pulse 82   Temp (!) 97.1 F (36.2 C)   Ht 5' 9 (1.753 m)   Wt 245 lb (111.1 kg) Comment: per patient in a wheelchair today  SpO2 96%   BMI 36.18 kg/m    Physical Examination:   General Appearance: No distress  EYES PERRLA, EOM intact.   NECK Supple, No JVD Pulmonary: normal breath sounds, No wheezing.  CardiovascularNormal S1,S2.  No m/r/g.   Abdomen: Benign, Soft, non-tender. Skin:   warm, no rashes, no ecchymosis  Extremities: normal, no cyanosis, clubbing. Neuro:without  focal findings,  speech normal  PSYCHIATRIC: Mood, affect within normal limits.   ASSESSMENT AND PLAN  OSA I suspect that OSA is likely present due to clinical presentation. Discussed the consequences of untreated sleep apnea. Advised not to drive drowsy for safety of patient and others. Will complete further evaluation with a home sleep study and follow up to review results.    HTN Stable, on current management. Following with PCP.   DMII Stable, on current management.    MEDICATION ADJUSTMENTS/LABS AND TESTS ORDERED: Recommend Sleep Study   Patient  satisfied with Plan of action and management. All questions answered  Follow up to review HST results and treatment plan.   I spent a total of 52 minutes reviewing chart data, face-to-face evaluation with the patient, counseling and coordination of care as detailed above.    Keyuana Wank, M.D.  Sleep Medicine [facility]

## 2024-06-26 ENCOUNTER — Other Ambulatory Visit: Payer: Self-pay | Admitting: Pharmacist

## 2024-06-26 DIAGNOSIS — E1140 Type 2 diabetes mellitus with diabetic neuropathy, unspecified: Secondary | ICD-10-CM

## 2024-06-26 LAB — TOXASSURE SELECT 13 (MW), URINE

## 2024-06-26 MED ORDER — TRULICITY 3 MG/0.5ML ~~LOC~~ SOAJ: 3.0000 mg | SUBCUTANEOUS | 0 refills | Status: DC

## 2024-06-26 NOTE — Progress Notes (Signed)
 06/26/2024 Name: Gabriella Marsh MRN: [id number] DOB: 1966-04-05  Chief Complaint  Patient presents with   Diabetes    Gabriella Marsh is a 58 y.o. year old female who presented for a telephone visit.   They were referred to the pharmacist by their PCP for assistance in managing diabetes.   Referral for DM control  ER 3 weeks ago for leg swelling and high BG's- found out Dexcom was not working  Subjective:  Care Team: Primary Care Provider: Donzella Lauraine SAILOR, DO ; Next Scheduled Visit: 05/22/24 Clinical Pharmacist: Gabriella Marsh, PharmD  Medication Access/Adherence  Current Pharmacy:  [facility] [phone] - [place],  - [address] AT [facility] [address] [place] [address] [place] [address] Phone: [phone] Fax: [phone]  [facility] - [place], [place] - [address] [place] [address] Phone: [phone] Fax: [phone]   Patient reports affordability concerns with their medications: No  Patient reports access/transportation concerns to their pharmacy: No  Patient reports adherence concerns with their medications:  No     Diabetes:  Current medications: Trulicity  1.5mg  weekly, Semglee  55U at bedtime, Novolog  20U with meals, Metformin  1000mg  twice a day (max dose) Medications tried in the past: Glipizide  (stopped insulin ), Trulicity  1.5mg  weekly (2023 due to shortage)   Using Accu-chek meter; testing 0 times daily (needs to find) *Currently uses Dexcom G7  06/26/24 visit:   06/01/24 visit:   05/29/24 visit:    05/24/24 visit:   05/19/24 visit:    5/27 visit:   5/13 visit:    Patient denies hypoglycemic s/sx including dizziness, shakiness, sweating. Patient denies hyperglycemic symptoms including polyuria, polydipsia, polyphagia, nocturia, neuropathy, blurred vision.  Current meal patterns:  - Breakfast (5:30AM): Bowl of cheerios + cup of coffee  - Lunch (12-1PM):  Mrs. Joselyn frozen foods prior, leftover now - Supper (5-6:30PM): Hot dogs, crockpot potato soup, eat out a good bit; goal of baked chicken + Mrs. Dash seasoning, rice or mac'n'cheese weekly - Snacks: Half an orange or banana - Drinks: Water, Splenda tea (half large tumbler daily) *Husband doesn't know how to cook- mostly microwavable items  Current physical activity: Can't walk- uses wheelchair  Current medication access support: [employer]   Objective:  Lab Results  Component Value Date   HGBA1C 11.1 (H) 04/21/2024    Lab Results  Component Value Date   CREATININE 0.68 04/21/2024   BUN 16 04/21/2024   NA 133 (L) 04/21/2024   K 5.4 (H) 04/21/2024   CL 92 (L) 04/21/2024   CO2 26 04/21/2024    Lab Results  Component Value Date   CHOL 166 04/21/2024   HDL 36 (L) 04/21/2024   LDLCALC 70 04/21/2024   TRIG 377 (H) 04/21/2024   CHOLHDL 4.6 (H) 04/21/2024    Medications Reviewed Today   Medications were not reviewed in this encounter       Assessment/Plan:   Diabetes: - Currently uncontrolled - Reviewed long term cardiovascular and renal outcomes of uncontrolled blood sugar - Reviewed goal A1c, goal fasting, and goal 2 hour post prandial glucose - Patient denies personal or family history of multiple endocrine neoplasia type 2, medullary thyroid  cancer; personal history of pancreatitis or gallbladder disease. - Recommend to check glucose continuously with Dexcom G7  Sliding scale from sister-in-law: 150-200= 2U 201-254= 4U 251-300= 6U 301-350= 8U 351-400= 10U   Follow Up Plan:  - Follow-up on 07/10/24  for insulin  titration + Dexcom review - INCREASE Trulicity  to 3mg  weekly on 7/15- new Rx sent today  - Had diarrhea with any Ozempic  doses higher than 0.25mg  weekly - INCREASE Semglee  62U nightly - Increase Novolog  to 20U three times a day - Reports she missed a few doses of meal time insulin  over the holiday weekend- will continue to work on  adherence  - Regarding GERD recommended to use additional pillow to prop herself up more at night - Next A1c check on 07/24/24   Gabriella Marsh, PharmD [place]  Phone Number: [phone]

## 2024-06-27 ENCOUNTER — Ambulatory Visit: Admitting: Physical Therapy

## 2024-06-27 DIAGNOSIS — M6281 Muscle weakness (generalized): Secondary | ICD-10-CM

## 2024-06-27 DIAGNOSIS — R2689 Other abnormalities of gait and mobility: Secondary | ICD-10-CM

## 2024-06-27 DIAGNOSIS — R262 Difficulty in walking, not elsewhere classified: Secondary | ICD-10-CM | POA: Diagnosis not present

## 2024-06-27 DIAGNOSIS — M5459 Other low back pain: Secondary | ICD-10-CM

## 2024-06-27 DIAGNOSIS — R2681 Unsteadiness on feet: Secondary | ICD-10-CM

## 2024-06-27 NOTE — Therapy (Unsigned)
 OUTPATIENT PHYSICAL THERAPY NEURO treatment/ RECERT       Patient Name: Gabriella Marsh MRN: [id number] DOB:Sep 01, 1966, 58 y.o., female Today's Date: 06/27/2024   PCP: Donzella Lauraine SAILOR, DO REFERRING PROVIDER: Laurence Prentice DASEN, MD  END OF SESSION:  PT End of Session - 06/27/24 1612     Visit Number 17    Number of Visits 25    Date for PT Re-Evaluation 09/04/24    Progress Note Due on Visit 20    PT Start Time 1620    PT Stop Time 1730    PT Time Calculation (min) 70 min    Equipment Utilized During Treatment Gait belt    Activity Tolerance Patient tolerated treatment well    Behavior During Therapy WFL for tasks assessed/performed            Past Medical History:  Diagnosis Date   Arthritis    knees   Blood clotting disorder (HCC)    on toe    Degenerative disc disease, lumbar    Diabetes mellitus without complication (HCC)    GERD (gastroesophageal reflux disease)    Headache    daily - AM (has not been able to have SPG blocks lately)   Hyperlipidemia    Hypertension    Neuropathy    feet   Vertigo    3-4x/yr   Past Surgical History:  Procedure Laterality Date   ABDOMINAL HYSTERECTOMY     But still has cervix   AMPUTATION TOE Right 07/24/2021   Procedure: AMPUTATION TOE;  Surgeon: Neill Boas, DPM;  Location: [facility] ORS;  Service: Podiatry;  Laterality: Right;   CESAREAN SECTION     CHOLECYSTECTOMY     COLONOSCOPY WITH PROPOFOL  N/A 02/20/2022   Procedure: COLONOSCOPY WITH PROPOFOL ;  Surgeon: Therisa Bi, MD;  Location: [facility] ENDOSCOPY;  Service: Gastroenterology;  Laterality: N/A;   left arm frature  10/12/2020   OVARIAN CYST SURGERY     PARATHYROIDECTOMY  03/28/2021   Procedure: PARATHYROIDECTOMY AUTOTRANSPLANT;  Surgeon: Marolyn Nest, MD;  Location: [facility] ORS;  Service: General;;   right femur surgery Right    SHOULDER SURGERY Left 12/26/014   Dr. CECILE Glenn, [facility]   THYROIDECTOMY N/A 03/28/2021   Procedure: THYROIDECTOMY, total;  Surgeon:  Marolyn Nest, MD;  Location: [facility] ORS;  Service: General;  Laterality: N/A;  Provider requesting 3 hours /180 minutes for procedure   TONSILLECTOMY AND ADENOIDECTOMY     UTERINE FIBROID SURGERY     Patient Active Problem List   Diagnosis Date Noted   Neuropathy 04/21/2024   Lymphedema 04/21/2024   Wheelchair dependence 03/10/2023   Hypertension associated with diabetes (HCC) 03/10/2023   AKI (acute kidney injury) (HCC) 01/07/2023   Neurogenic bladder 01/07/2023   FTT (failure to thrive) in adult 12/09/2022   Primary insomnia 09/10/2022   Impaired functional mobility, balance, gait, and endurance 06/09/2022   Type 2 diabetes mellitus with diabetic neuropathy, with long-term current use of insulin  (HCC) 04/24/2022   Hypothyroidism 04/24/2022   Hyperlipidemia associated with type 2 diabetes mellitus (HCC) 04/24/2022   Iron deficiency anemia 04/24/2022   Chronic sensorimotor polyneuropathy with axonal and demyelinating features 01/12/2022   Osteoarthritis of knees (Bilateral) 01/12/2022   Tricompartment osteoarthritis of knees (Bilateral) 01/12/2022    Grade 1 Retrolisthesis (9mm) of L5 over S1 01/12/2022   History of proximal humerus fracture (Left) 01/12/2022   Vitamin D  deficiency 01/12/2022   Chronic peripheral neuropathic pain 01/12/2022   Disorder of skeletal system 11/12/2021   Problems influencing health  status 11/12/2021   Chronic hand pain (1ry area of Pain) (Bilateral) (L>R) 11/12/2021   Chronic feet pain (3ry area of Pain) (Bilateral) (L>R) 11/12/2021   Chronic shoulder pain (4th area of Pain) (Left) 11/12/2021   Chronic knee pain (5th area of Pain) (Bilateral) (L>R) 11/12/2021   Lumbar facet syndrome (Bilateral) 11/12/2021   COVID-19 virus infection 07/22/2021   Chronic pain syndrome 07/17/2021   S/P total thyroidectomy 03/28/2021   Thyroid  nodule    Schamberg's purpura 10/09/2019   Common migraine with intractable migraine 06/18/2015   DDD (degenerative disc  disease), lumbosacral 05/03/2015   Insomnia due to medical condition 05/03/2015   Disorder of peripheral nervous system 05/03/2015   Diabetic peripheral neuropathy (HCC) 03/06/2015   Obesity 03/06/2015   GERD (gastroesophageal reflux disease) 03/06/2015   Multinodular goiter 03/06/2015   RLS (restless legs syndrome) 03/06/2015   Chronic low back pain (2ry area of Pain) (Bilateral) (L>R) w/o sciatica 03/06/2015    ONSET DATE: 06/18/22  REFERRING DIAG: S72.91XD (ICD-10-CM) - Unspecified fracture of right femur, subsequent encounter for closed fracture with routine healing   THERAPY DIAG:  Difficulty in walking, not elsewhere classified  Muscle weakness (generalized)  Other abnormalities of gait and mobility  Unsteadiness on feet  Other low back pain  Rationale for Evaluation and Treatment: Rehabilitation  SUBJECTIVE:                                                                                                                                                                                             SUBJECTIVE STATEMENT:  Pt reports that she is doing well. Is walking in the house without AFOs.  Mild/moderate pain in the low back over the weekend, but improved on this day.    Pt accompanied by: Spouse   PERTINENT HISTORY: Pt is a pleasant 58 y/o female known to PT clinic, returning for impairments due to R femur fx sustained in 2023. Pt hx includes fall in June 2023 where pt suffered R femur fx followed by surgery where pt had plate and screws placed in R knee and rod in R femur. At the time pt with hx of multiple falls, now pt reports no falls in last 6 months. Since previous d/c from PT on 05/13/2023, pt has been consistently going to [facility] clinic 1x/week, and now returns to [facility] for new year. Pt reports she ambulated with RW while at [facility] clinic, has been able to perform STS to RW from higher surfaces. She would like to continue working on her mobility and strength, including  both UE and LE strength. She still struggles with putting on her shoes  and socks and getting from her bed to the commode. She reports standing at [facility] also limited due to chronic low back pain. She reports she'll likely get injections to tx LBP in future. Pt still performing previous HEP given, typically she completes 1 set of each exercise for 25-30 reps. Says this is easy. PMH per chart significant for arthritis, blood clotting disorder, DDD (lumbar), DM, headache, HTN, neuropathy, vertigo   PAIN:  Are you having pain? Yes: NPRS scale: not numerically rated Pain location: low back, bilat hand pain hurts a lot all the time  Pain description:   Aggravating factors: standing limited due to LBP in position Relieving factors: wearing gloves helps with hand pain  PRECAUTIONS: Fall  RED FLAGS: None   WEIGHT BEARING RESTRICTIONS: No  FALLS: Has patient fallen in last 6 months? No  LIVING ENVIRONMENT: via chart, pt confirms the following is still correct Lives with: lives with their family and lives with their spouse Lives in: House/apartment Stairs: No Has following equipment at home: Environmental consultant - 2 wheeled, Wheelchair (manual), Shower bench, bed side commode, and Grab bars  PLOF: Needs assistance with ADLs  PATIENT GOALS: Get out from bed to walker to bedside commode with or without LE braces donned, improve strength in UE/LE  OBJECTIVE:  Note: Objective measures were completed at Evaluation unless otherwise noted.  DIAGNOSTIC FINDINGS:   Via chart [facility] 05/13/2023 XR Femur 2 views right: Impression  Unchanged position and alignment with further osseous bridging at the distal femur fracture following intramedullary rod fixation. And findings FINDINGS:  Position and alignment of the comminuted distal femur fracture are unchanged after placement of a retrograde interlocking intramedullary rod. Proximal and distal interlocking components articulate appropriately with the rod.  There is progressive osseous bridging with remodeling at the fracture site, there is no new fracture or dislocation.   COGNITION: Overall cognitive status: Within functional limits for tasks assessed   SENSATION: Reports impaired BLE, does have sensation on tops of thighs per report   COORDINATION: WFL UE rapid alt movement WFL LE with heel to shin, but unable to perform rapid alt movement due to weakness in bilat DFs  EDEMA:  Reports no swelling    POSTURE: rounded shoulders and forward head  LOWER EXTREMITY ROM:     Slight impairment with R ankle DF > L with PROM Slight impairment of L knee extension and somewhat painful    LOWER EXTREMITY MMT:    Grossly 4/5 in BLE exception is pt unable to complete DF bilat, other areas of significant mm deficit include hip flexors, abductors and hamstrings bilat  TRANSFERS:  Attempted STS to RW from WC during eval. Unable to perform from wheelchair even with max assist. Pt reports she has been able to complete STS from higher surfaces and this is how she practiced in [facility]  Assistive device utilized: Walker - 2 wheeled  Sit to stand: unable from standard chair height/WC see above  FUNCTIONAL TESTS:  Attempted STS from [facility], unable at this time. Will attempt functional test like 30 sec STS in future as pt able  PATIENT SURVEYS:  LEFS 50/80 FOTO 54  TREATMENT DATE: 06/27/2024   Nustep . Reciprocal movement training     Sit<>stand x 6+5 with BUE to push into standing CGA-min assist on first bout and min-mod assist from PT due to fatigue on second bout.   Gait with RW x 60ft with CGA for safety.   Standing  Side stepping at rail 3 x 25ft bil with BUE support  March with BUE support x 6 bil   Hip extension x 6  Static standing to move pieces x 2 min until pt reports mild LBP   Seated:  Ankle DF x  12   Throughout session, PT provided CGA for safety while in standing with min assist for transfers as listed above. Remained attentive to pt needs and provided multiple seated therapeutic rest breaks as needed   PATIENT EDUCATION: Education details: assessment findings, goals, plan, HEP. WC planning HEP expansion  Pt educated throughout session about proper posture and technique with exercises. Improved exercise technique, movement at target joints, use of target muscles after min to mod verbal, visual, tactile cues.  Person educated: Patient and Spouse Education method: Explanation, Verbal cues, and Handouts Education comprehension: verbalized understanding and returned demonstration  HOME EXERCISE PROGRAM: Access Code: [email or web address] URL: [email or web address] Date: 12/29/2023 Prepared by: Darryle Patten  Exercises - Seated Hamstring Curls with Resistance  - 1 x daily - 5-6 x weekly - 3-4 sets - 20-25 reps - Seated Hip Abduction with Resistance  - 1 x daily - 5-6 x weekly - 3-4 sets - 20-25 reps - Seated March  - 1 x daily - 5-6 x weekly - 3-4 sets - 20-25 reps   Access Code: [email or web address] URL: [email or web address] Date: 01/26/2024 Prepared by: Massie Dollar  Exercises - Supine Heel Slide  - 1 x daily - 7 x weekly - 3 sets - 10 reps - Supine Straight Leg Raises  - 1 x daily - 7 x weekly - 3 sets - 10 reps - Hooklying Clamshell with Resistance  - 1 x daily - 7 x weekly - 3 sets - 10 reps - Supine Hip Flexion  - 1 x daily - 7 x weekly - 3 sets - 10 reps - Supine Single Leg Ankle Pumps  - 1 x daily - 7 x weekly - 3 sets - 10 reps - Supine Bridge  - 1 x daily - 7 x weekly - 3 sets - 10 reps GOALS: Goals reviewed with patient? Yes  SHORT TERM GOALS: Target date: 02/09/2024   Patient will be independent in home exercise program to improve strength/mobility for better functional independence with ADLs. Baseline: initiated Goal status: INITIAL    LONG TERM  GOALS: Target date:  09/04/2024     Patient will perform sit to stand with min-mod assist from standard chair height in order to indicate improved lower extremity functional strength and improved ability to perform functional transfers and functional leg activities within her home.  Baseline: mod assist with +2 for safety.  4/2: mod assist 6/23: CGA from wheelchair  Goal status: Partially met   2.  Pt will improve LEFS by 10 points or more in order to indicate improved LE function and mobility.  Baseline: 50 4/2: 32 Goal status: INITIAL  3.  Patient will increase FOTO score to equal to or greater than  59   to demonstrate statistically significant improvement in mobility and quality of life.  Baseline: 54 Goal status: goal d/c'ed.   4.  Pt will require no more than min assist for transfer  from bed to bedside commode Baseline: supervision assist with  slide board transfer. To mat table  Using walker for transfer to [facility] with mod assist for safety and  6/23: husband helps supervise and set up with CGA Goal status: MET  5.  The pt will demo ability to don socks and shoes indep or mod I for increased ease with dressing and improved independence Baseline: required max assist from husband  4/2: pt states that she is donning night socks. Is still requiring max assist for LLE and moderate assist for the RLE  6/23; husband requires max A for socks and shoes Goal status: Progressing  6.  The pt will demo ability to complete 30 second STS test to indicate increased ease with transfers and BLE strength Baseline: 2 sit<>stand from [facility] with cushion in place and  +2 for safety.  4/2: 3 with mod assist and +2 to stabilize RW  6/23: 3 with CGA; +2 to stabilize RW without brace:  With KAFOs on: 3.5 with CGA; +2 to stabilize  Goal status: progressing   7.  Patient will increase 10 meter walk test to >1.61m/s as to improve gait speed for better community ambulation and to reduce fall risk. Baseline:  5/23: 2 min 2 seconds with orthotics on; 1 min 7 seconds without orthotics with wheelchair follow and BrW.  Goal status: NEW   ASSESSMENT:  CLINICAL IMPRESSION:  PT performs well throughout session. Requesting to walk without AFOs on this day. Still greatly limited in active DF in BLE, able to achieve flat foot contact on the RLE intermittently. Encouraged pt to reach out to hanger to remove knee locking component from R side KAFO to allow improved ankle control, but allow knee swing in gait. Continued to address standing tolerance and closed/open chain strengthening to improve functional use of BLE.  The pt will benefit from further skilled PT to improve these impairments in order to increase ease with ADLs, QOL and to reduce fall risk.   OBJECTIVE IMPAIRMENTS: Abnormal gait, decreased activity tolerance, decreased balance, decreased coordination, decreased endurance, decreased mobility, difficulty walking, decreased ROM, decreased strength, hypomobility, impaired sensation, improper body mechanics, postural dysfunction, and pain.   ACTIVITY LIMITATIONS: carrying, lifting, bending, standing, squatting, stairs, transfers, bed mobility, bathing, toileting, dressing, and locomotion level  PARTICIPATION LIMITATIONS: meal prep, cleaning, laundry, driving, shopping, community activity, and yard work  PERSONAL FACTORS: Fitness, Sex, Time since onset of injury/illness/exacerbation, and 3+ comorbidities: PMH per chart significant for arthritis, blood clotting disorder, DDD (lumbar), DM, headache, HTN, neuropathy, vertigo are also affecting patient's functional outcome.   REHAB POTENTIAL: Good  CLINICAL DECISION MAKING: Evolving/moderate complexity  EVALUATION COMPLEXITY: Moderate  PLAN:  PT FREQUENCY: 1-2x/week  PT DURATION: 12 weeks  PLANNED INTERVENTIONS: 97164- PT Re-evaluation, 97110-Therapeutic exercises, 97530- Therapeutic activity, [id number]- Neuromuscular re-education, 97535- Self Care,  97140- Manual therapy, [id number]- Gait training, 02239- Orthotic Fit/training, 04007- Canalith repositioning, [id number]- Electrical stimulation (manual), [id number]- Ultrasound, Patient/Family education, Balance training, Stair training, Taping, Dry Needling, Joint mobilization, Spinal mobilization, Vestibular training, DME instructions, Wheelchair mobility training, Cryotherapy, and Moist heat  PLAN FOR NEXT SESSION:   Continue BLE strengthening.  Gait as tolerated with bracing as appropriate.   Massie FORBES Dollar, PT 06/27/2024, 4:13 PM

## 2024-06-28 ENCOUNTER — Telehealth: Payer: Self-pay | Admitting: Family Medicine

## 2024-06-28 DIAGNOSIS — F5101 Primary insomnia: Secondary | ICD-10-CM

## 2024-06-28 MED ORDER — LEVOTHYROXINE SODIUM 112 MCG PO TABS: 224.0000 ug | ORAL_TABLET | Freq: Every day | ORAL | 3 refills | Status: DC

## 2024-06-28 MED ORDER — TRAZODONE HCL 100 MG PO TABS: 100.0000 mg | ORAL_TABLET | Freq: Every day | ORAL | 3 refills | Status: DC

## 2024-06-28 NOTE — Telephone Encounter (Addendum)
 [facility] pharmacy faxed refill request for the following medications:    traZODone  (DESYREL ) 50 MG tablet  levothyroxine  (SYNTHROID ) 125 MCG tablet    Please advise

## 2024-06-28 NOTE — Addendum Note (Signed)
 Addended by: DONZELLA DOMINO on: 06/28/2024 04:52 PM   Modules accepted: Orders

## 2024-07-05 ENCOUNTER — Ambulatory Visit

## 2024-07-05 DIAGNOSIS — M6281 Muscle weakness (generalized): Secondary | ICD-10-CM | POA: Diagnosis not present

## 2024-07-05 DIAGNOSIS — R2689 Other abnormalities of gait and mobility: Secondary | ICD-10-CM

## 2024-07-05 DIAGNOSIS — M5459 Other low back pain: Secondary | ICD-10-CM | POA: Diagnosis not present

## 2024-07-05 DIAGNOSIS — R2681 Unsteadiness on feet: Secondary | ICD-10-CM | POA: Diagnosis not present

## 2024-07-05 DIAGNOSIS — R262 Difficulty in walking, not elsewhere classified: Secondary | ICD-10-CM | POA: Diagnosis not present

## 2024-07-05 NOTE — Therapy (Signed)
 OUTPATIENT PHYSICAL THERAPY NEURO treatment      Patient Name: Gabriella Marsh MRN: [id number] DOB:[birth year], 58 y.o., female Today's Date: 07/05/2024   PCP: Donzella Lauraine SAILOR, DO REFERRING PROVIDER: Laurence Prentice DASEN, MD  END OF SESSION:  PT End of Session - 07/05/24 1612     Visit Number 18    Number of Visits 25    Date for PT Re-Evaluation 09/04/24    Progress Note Due on Visit 20    PT Start Time 1615    PT Stop Time 1657    PT Time Calculation (min) 42 min    Equipment Utilized During Treatment Gait belt    Activity Tolerance Patient tolerated treatment well    Behavior During Therapy WFL for tasks assessed/performed             Past Medical History:  Diagnosis Date   Arthritis    knees   Blood clotting disorder (HCC)    on toe    Degenerative disc disease, lumbar    Diabetes mellitus without complication (HCC)    GERD (gastroesophageal reflux disease)    Headache    daily - AM (has not been able to have SPG blocks lately)   Hyperlipidemia    Hypertension    Neuropathy    feet   Vertigo    3-4x/yr   Past Surgical History:  Procedure Laterality Date   ABDOMINAL HYSTERECTOMY     But still has cervix   AMPUTATION TOE Right 07/24/2021   Procedure: AMPUTATION TOE;  Surgeon: Neill Boas, DPM;  Location: [facility];  Service: Podiatry;  Laterality: Right;   CESAREAN SECTION     CHOLECYSTECTOMY     COLONOSCOPY WITH PROPOFOL  N/A 02/20/2022   Procedure: COLONOSCOPY WITH PROPOFOL ;  Surgeon: Therisa Bi, MD;  Location: [facility];  Service: Gastroenterology;  Laterality: N/A;   left arm frature  10/12/2020   OVARIAN CYST SURGERY     PARATHYROIDECTOMY  03/28/2021   Procedure: PARATHYROIDECTOMY AUTOTRANSPLANT;  Surgeon: Marolyn Nest, MD;  Location: [facility];  Service: General;;   right femur surgery Right    SHOULDER SURGERY Left 12/26/014   Dr. CECILE Glenn, [facility]   THYROIDECTOMY N/A 03/28/2021   Procedure: THYROIDECTOMY, total;  Surgeon: Marolyn Nest, MD;  Location: [facility];  Service: General;  Laterality: N/A;  Provider requesting 3 hours /180 minutes for procedure   TONSILLECTOMY AND ADENOIDECTOMY     UTERINE FIBROID SURGERY     Patient Active Problem List   Diagnosis Date Noted   Neuropathy 04/21/2024   Lymphedema 04/21/2024   Wheelchair dependence 03/10/2023   Hypertension associated with diabetes (HCC) 03/10/2023   AKI (acute kidney injury) (HCC) 01/07/2023   Neurogenic bladder 01/07/2023   FTT (failure to thrive) in adult 12/09/2022   Primary insomnia 09/10/2022   Impaired functional mobility, balance, gait, and endurance 06/09/2022   Type 2 diabetes mellitus with diabetic neuropathy, with long-term current use of insulin  (HCC) 04/24/2022   Hypothyroidism 04/24/2022   Hyperlipidemia associated with type 2 diabetes mellitus (HCC) 04/24/2022   Iron deficiency anemia 04/24/2022   Chronic sensorimotor polyneuropathy with axonal and demyelinating features 01/12/2022   Osteoarthritis of knees (Bilateral) 01/12/2022   Tricompartment osteoarthritis of knees (Bilateral) 01/12/2022    Grade 1 Retrolisthesis (9mm) of L5 over S1 01/12/2022   History of proximal humerus fracture (Left) 01/12/2022   Vitamin D  deficiency 01/12/2022   Chronic peripheral neuropathic pain 01/12/2022   Disorder of skeletal system 11/12/2021   Problems influencing health status  11/12/2021   Chronic hand pain (1ry area of Pain) (Bilateral) (L>R) 11/12/2021   Chronic feet pain (3ry area of Pain) (Bilateral) (L>R) 11/12/2021   Chronic shoulder pain (4th area of Pain) (Left) 11/12/2021   Chronic knee pain (5th area of Pain) (Bilateral) (L>R) 11/12/2021   Lumbar facet syndrome (Bilateral) 11/12/2021   COVID-19 virus infection 07/22/2021   Chronic pain syndrome 07/17/2021   S/P total thyroidectomy 03/28/2021   Thyroid  nodule    Schamberg's purpura 10/09/2019   Common migraine with intractable migraine 06/18/2015   DDD (degenerative disc disease),  lumbosacral 05/03/2015   Insomnia due to medical condition 05/03/2015   Disorder of peripheral nervous system 05/03/2015   Diabetic peripheral neuropathy (HCC) 03/06/2015   Obesity 03/06/2015   GERD (gastroesophageal reflux disease) 03/06/2015   Multinodular goiter 03/06/2015   RLS (restless legs syndrome) 03/06/2015   Chronic low back pain (2ry area of Pain) (Bilateral) (L>R) w/o sciatica 03/06/2015    ONSET DATE: 06/18/22  REFERRING DIAG: S72.91XD (ICD-10-CM) - Unspecified fracture of right femur, subsequent encounter for closed fracture with routine healing   THERAPY DIAG:  Difficulty in walking, not elsewhere classified  Muscle weakness (generalized)  Other abnormalities of gait and mobility  Unsteadiness on feet  Rationale for Evaluation and Treatment: Rehabilitation  SUBJECTIVE:                                                                                                                                                                                             SUBJECTIVE STATEMENT:  Pt reports that she has been having issues with her blood sugar levels recently. Pt arrives without KAFO braces today and reports she has not been using them lately. Pt states she has an appointment with [facility] soon to adjust KAFOs.    Pt accompanied by: Spouse   PERTINENT HISTORY: Pt is a pleasant 58 y/o female known to PT clinic, returning for impairments due to R femur fx sustained in 2023. Pt hx includes fall in June 2023 where pt suffered R femur fx followed by surgery where pt had plate and screws placed in R knee and rod in R femur. At the time pt with hx of multiple falls, now pt reports no falls in last 6 months. Since previous d/c from PT on 05/13/2023, pt has been consistently going to [facility] clinic 1x/week, and now returns to [facility] for new year. Pt reports she ambulated with RW while at [facility] clinic, has been able to perform STS to RW from higher surfaces. She would like to  continue working on her mobility and strength, including both UE and LE strength. She  still struggles with putting on her shoes and socks and getting from her bed to the commode. She reports standing at [facility] also limited due to chronic low back pain. She reports she'll likely get injections to tx LBP in future. Pt still performing previous HEP given, typically she completes 1 set of each exercise for 25-30 reps. Says this is easy. PMH per chart significant for arthritis, blood clotting disorder, DDD (lumbar), DM, headache, HTN, neuropathy, vertigo   PAIN:  Are you having pain? Yes: NPRS scale: not numerically rated Pain location: low back, bilat hand pain hurts a lot all the time  Pain description:   Aggravating factors: standing limited due to LBP in position Relieving factors: wearing gloves helps with hand pain  PRECAUTIONS: Fall  RED FLAGS: None   WEIGHT BEARING RESTRICTIONS: No  FALLS: Has patient fallen in last 6 months? No  LIVING ENVIRONMENT: via chart, pt confirms the following is still correct Lives with: lives with their family and lives with their spouse Lives in: House/apartment Stairs: No Has following equipment at home: Environmental consultant - 2 wheeled, Wheelchair (manual), Shower bench, bed side commode, and Grab bars  PLOF: Needs assistance with ADLs  PATIENT GOALS: Get out from bed to walker to bedside commode with or without LE braces donned, improve strength in UE/LE  OBJECTIVE:  Note: Objective measures were completed at Evaluation unless otherwise noted.  DIAGNOSTIC FINDINGS:   Via chart [facility] 05/13/2023 XR Femur 2 views right: Impression  Unchanged position and alignment with further osseous bridging at the distal femur fracture following intramedullary rod fixation. And findings FINDINGS:  Position and alignment of the comminuted distal femur fracture are unchanged after placement of a retrograde interlocking intramedullary rod. Proximal and distal  interlocking components articulate appropriately with the rod. There is progressive osseous bridging with remodeling at the fracture site, there is no new fracture or dislocation.   COGNITION: Overall cognitive status: Within functional limits for tasks assessed   SENSATION: Reports impaired BLE, does have sensation on tops of thighs per report   COORDINATION: WFL UE rapid alt movement WFL LE with heel to shin, but unable to perform rapid alt movement due to weakness in bilat DFs  EDEMA:  Reports no swelling    POSTURE: rounded shoulders and forward head  LOWER EXTREMITY ROM:     Slight impairment with R ankle DF > L with PROM Slight impairment of L knee extension and somewhat painful    LOWER EXTREMITY MMT:    Grossly 4/5 in BLE exception is pt unable to complete DF bilat, other areas of significant mm deficit include hip flexors, abductors and hamstrings bilat  TRANSFERS:  Attempted STS to RW from WC during eval. Unable to perform from wheelchair even with max assist. Pt reports she has been able to complete STS from higher surfaces and this is how she practiced in [facility]  Assistive device utilized: Walker - 2 wheeled  Sit to stand: unable from standard chair height/WC see above  FUNCTIONAL TESTS:  Attempted STS from [facility], unable at this time. Will attempt functional test like 30 sec STS in future as pt able  PATIENT SURVEYS:  LEFS 50/80 FOTO 54  TREATMENT DATE: 07/05/2024  BP 159/82 prior to exercise  Ther-act:  Gait with RW, CGA, and WC follow: - bout 1: 33ft - bout 2: 72ft- pt demonstrated decreased step length bilaterally on second bout with increased foot slap on L at initial contact  Seated:  Heel raise x10 Toe raise x10 LAQ with 1.5# AW donned 2x10    Self-Care & Home Management: -Reviewed updated HEP with pt that includes  more standing exercises to improve WB tolerance and carryover to improved transfers and gait. SPT reviewed, explained, and demonstrated each exercise with pt returning demonstration for all exercises:  Access Code: [email or web address]  URL: [email or web address] Date: 07/05/2024  Prepared by: Laurian Custard  Exercises  - Standing Anterior Posterior Weight Shift with Chair  - 1 x daily - 7 x weekly - 2 sets - 10 reps - 5 hold  - Standing Weight Shift Side to Side  - 1 x daily - 7 x weekly - 2 sets - 10 reps - 5 hold  - Standing March with Counter Support  - 1 x daily - 7 x weekly - 2 sets - 10 reps - 5 hold  - Standing Toe Taps  - 1 x daily - 7 x weekly - 2 sets - 10 reps - 5 hold  - Squat with Chair and Counter Support  - 1 x daily - 7 x weekly - 2 sets - 10 reps - 5 hold    Throughout session, SPT provided CGA for safety   PATIENT EDUCATION: Education details: assessment findings, goals, plan, HEP. WC planning HEP expansion  Pt educated throughout session about proper posture and technique with exercises. Improved exercise technique, movement at target joints, use of target muscles after min to mod verbal, visual, tactile cues.  Person educated: Patient and Spouse Education method: Explanation, Verbal cues, and Handouts Education comprehension: verbalized understanding and returned demonstration  HOME EXERCISE PROGRAM: Access Code: [email or web address]  URL: [email or web address] Date: 07/05/2024  Prepared by: Marina  Moser  Exercises  - Standing Anterior Posterior Weight Shift with Chair  - 1 x daily - 7 x weekly - 2 sets - 10 reps - 5 hold  - Standing Weight Shift Side to Side  - 1 x daily - 7 x weekly - 2 sets - 10 reps - 5 hold  - Standing March with Counter Support  - 1 x daily - 7 x weekly - 2 sets - 10 reps - 5 hold  - Standing Toe Taps  - 1 x daily - 7 x weekly - 2 sets - 10 reps - 5 hold  - Squat with Chair and Counter Support  - 1 x daily - 7 x weekly - 2 sets - 10 reps  - 5 hold   Access Code: [email or web address] URL: [email or web address] Date: 12/29/2023 Prepared by: Darryle Patten  Exercises - Seated Hamstring Curls with Resistance  - 1 x daily - 5-6 x weekly - 3-4 sets - 20-25 reps - Seated Hip Abduction with Resistance  - 1 x daily - 5-6 x weekly - 3-4 sets - 20-25 reps - Seated March  - 1 x daily - 5-6 x weekly - 3-4 sets - 20-25 reps   Access Code: [email or web address] URL: [email or web address] Date: 01/26/2024 Prepared by: Massie Dollar  Exercises - Supine Heel Slide  - 1 x daily - 7 x weekly - 3 sets - 10 reps - Supine Straight Leg Raises  - 1 x daily - 7 x weekly - 3 sets - 10 reps - Hooklying  Clamshell with Resistance  - 1 x daily - 7 x weekly - 3 sets - 10 reps - Supine Hip Flexion  - 1 x daily - 7 x weekly - 3 sets - 10 reps - Supine Single Leg Ankle Pumps  - 1 x daily - 7 x weekly - 3 sets - 10 reps - Supine Bridge  - 1 x daily - 7 x weekly - 3 sets - 10 reps GOALS: Goals reviewed with patient? Yes  SHORT TERM GOALS: Target date: 02/09/2024   Patient will be independent in home exercise program to improve strength/mobility for better functional independence with ADLs. Baseline: initiated Goal status: INITIAL    LONG TERM GOALS: Target date:  09/04/2024     Patient will perform sit to stand with min-mod assist from standard chair height in order to indicate improved lower extremity functional strength and improved ability to perform functional transfers and functional leg activities within her home.  Baseline: mod assist with +2 for safety.  4/2: mod assist 6/23: CGA from wheelchair  Goal status: Partially met   2.  Pt will improve LEFS by 10 points or more in order to indicate improved LE function and mobility.  Baseline: 50 4/2: 32 Goal status: INITIAL  3.  Patient will increase FOTO score to equal to or greater than  59   to demonstrate statistically significant improvement in mobility and quality of life.   Baseline: 54 Goal status: goal d/c'ed.   4.  Pt will require no more than min assist for transfer from bed to bedside commode Baseline: supervision assist with  slide board transfer. To mat table  Using walker for transfer to [facility] with mod assist for safety and  6/23: husband helps supervise and set up with CGA Goal status: MET  5.  The pt will demo ability to don socks and shoes indep or mod I for increased ease with dressing and improved independence Baseline: required max assist from husband  4/2: pt states that she is donning night socks. Is still requiring max assist for LLE and moderate assist for the RLE  6/23; husband requires max A for socks and shoes Goal status: Progressing  6.  The pt will demo ability to complete 30 second STS test to indicate increased ease with transfers and BLE strength Baseline: 2 sit<>stand from [facility] with cushion in place and  +2 for safety.  4/2: 3 with mod assist and +2 to stabilize RW  6/23: 3 with CGA; +2 to stabilize RW without brace:  With KAFOs on: 3.5 with CGA; +2 to stabilize  Goal status: progressing   7.  Patient will increase 10 meter walk test to >1.40m/s as to improve gait speed for better community ambulation and to reduce fall risk. Baseline: 5/23: 2 min 2 seconds with orthotics on; 1 min 7 seconds without orthotics with wheelchair follow and BrW.  Goal status: NEW   ASSESSMENT:  CLINICAL IMPRESSION:  Pt arrived with high motivation to participate in PT. Pt was able to progress her ambulation distance today to a total of 130ft over 2 bouts of ambulation. Pt also required less assistance for transfers today with only CGA for all activities. Pt continues to have difficulty with active dorsiflexion, limiting her ability to properly progress through heel/toe rockers of gait. The pt will benefit from further skilled PT to improve these impairments in order to increase ease with ADLs, QOL and to reduce fall risk.   OBJECTIVE IMPAIRMENTS:  Abnormal gait, decreased activity  tolerance, decreased balance, decreased coordination, decreased endurance, decreased mobility, difficulty walking, decreased ROM, decreased strength, hypomobility, impaired sensation, improper body mechanics, postural dysfunction, and pain.   ACTIVITY LIMITATIONS: carrying, lifting, bending, standing, squatting, stairs, transfers, bed mobility, bathing, toileting, dressing, and locomotion level  PARTICIPATION LIMITATIONS: meal prep, cleaning, laundry, driving, shopping, community activity, and yard work  PERSONAL FACTORS: Fitness, Sex, Time since onset of injury/illness/exacerbation, and 3+ comorbidities: PMH per chart significant for arthritis, blood clotting disorder, DDD (lumbar), DM, headache, HTN, neuropathy, vertigo are also affecting patient's functional outcome.   REHAB POTENTIAL: Good  CLINICAL DECISION MAKING: Evolving/moderate complexity  EVALUATION COMPLEXITY: Moderate  PLAN:  PT FREQUENCY: 1-2x/week  PT DURATION: 12 weeks  PLANNED INTERVENTIONS: 97164- PT Re-evaluation, 97110-Therapeutic exercises, 97530- Therapeutic activity, [id number]- Neuromuscular re-education, 97535- Self Care, [id number]- Manual therapy, [id number]- Gait training, [phone]- Orthotic Fit/training, [id number]- Canalith repositioning, [id number]- Electrical stimulation (manual), [id number]- Ultrasound, Patient/Family education, Balance training, Stair training, Taping, Dry Needling, Joint mobilization, Spinal mobilization, Vestibular training, DME instructions, Wheelchair mobility training, Cryotherapy, and Moist heat  PLAN FOR NEXT SESSION:   Continue BLE strengthening.  Gait as tolerated  Ankle AROM/strengthening  Janell Axe, SPT  This entire session was performed under direct supervision and direction of a licensed therapist/therapist assistant . I have personally read, edited and approve of the note as written.    Marina  Moser, PT 07/05/2024, 5:08 PM

## 2024-07-06 ENCOUNTER — Encounter

## 2024-07-06 DIAGNOSIS — G4733 Obstructive sleep apnea (adult) (pediatric): Secondary | ICD-10-CM

## 2024-07-10 ENCOUNTER — Ambulatory Visit

## 2024-07-10 ENCOUNTER — Other Ambulatory Visit: Payer: Self-pay | Admitting: Pharmacist

## 2024-07-10 DIAGNOSIS — R2681 Unsteadiness on feet: Secondary | ICD-10-CM | POA: Diagnosis not present

## 2024-07-10 DIAGNOSIS — R262 Difficulty in walking, not elsewhere classified: Secondary | ICD-10-CM | POA: Diagnosis not present

## 2024-07-10 DIAGNOSIS — R2689 Other abnormalities of gait and mobility: Secondary | ICD-10-CM | POA: Diagnosis not present

## 2024-07-10 DIAGNOSIS — M5459 Other low back pain: Secondary | ICD-10-CM | POA: Diagnosis not present

## 2024-07-10 DIAGNOSIS — M6281 Muscle weakness (generalized): Secondary | ICD-10-CM | POA: Diagnosis not present

## 2024-07-10 NOTE — Progress Notes (Signed)
 07/10/2024 Name: Gabriella Marsh MRN: [id number] DOB: [birth year]  Chief Complaint  Patient presents with   Diabetes    Gabriella Marsh is a 58 y.o. year old female who presented for a telephone visit.   They were referred to the pharmacist by their PCP for assistance in managing diabetes.   Referral for DM control  ER 3 weeks ago for leg swelling and high BG's- found out Dexcom was not working  Subjective:  Care Team: Primary Care Provider: Donzella Lauraine SAILOR, DO ; Next Scheduled Visit: 05/22/24 Clinical Pharmacist: Aloysius Lewis, PharmD  Medication Access/Adherence  Current Pharmacy:  [facility] [phone] - [place], [place] - [address] AT [facility] [address] [address] [place] [address] Phone: [phone] Fax: [phone]  [facility] - [place], [place] - [address] [place] [address] Phone: [phone] Fax: [phone]   Patient reports affordability concerns with their medications: No  Patient reports access/transportation concerns to their pharmacy: No  Patient reports adherence concerns with their medications:  No     Diabetes:  Current medications: Trulicity  3mg  weekly, Semglee  62U at bedtime, Novolog  20U with meals, Metformin  1000mg  twice a day (max dose) Medications tried in the past: Glipizide  (stopped insulin ), Trulicity  1.5mg  weekly (2023 due to shortage)   Using Accu-chek meter; testing 0 times daily (needs to find) *Currently uses Dexcom G7  07/10/24 visit:  06/26/24 visit:   06/01/24 visit:   05/29/24 visit:    05/24/24 visit:   05/19/24 visit:    5/27 visit:   5/13 visit:    Patient denies hypoglycemic s/sx including dizziness, shakiness, sweating. Patient denies hyperglycemic symptoms including polyuria, polydipsia, polyphagia, nocturia, neuropathy, blurred vision.  Current meal patterns:  - Breakfast (5:30AM): Bowl of cheerios + cup of coffee  -  Lunch (12-1PM): Mrs. Joselyn frozen foods prior, leftover now - Supper (5-6:30PM): Hot dogs, crockpot potato soup, eat out a good bit; goal of baked chicken + Mrs. Dash seasoning, rice or mac'n'cheese weekly - Snacks: Half an orange or banana - Drinks: Water, Splenda tea (half large tumbler daily) *Husband doesn't know how to cook- mostly microwavable items  Current physical activity: Can't walk- uses wheelchair  Current medication access support: [employer]   Objective:  Lab Results  Component Value Date   HGBA1C 11.1 (H) 04/21/2024    Lab Results  Component Value Date   CREATININE 0.68 04/21/2024   BUN 16 04/21/2024   NA 133 (L) 04/21/2024   K 5.4 (H) 04/21/2024   CL 92 (L) 04/21/2024   CO2 26 04/21/2024    Lab Results  Component Value Date   CHOL 166 04/21/2024   HDL 36 (L) 04/21/2024   LDLCALC 70 04/21/2024   TRIG 377 (H) 04/21/2024   CHOLHDL 4.6 (H) 04/21/2024    Medications Reviewed Today   Medications were not reviewed in this encounter       Assessment/Plan:   Diabetes: - Currently uncontrolled - Reviewed long term cardiovascular and renal outcomes of uncontrolled blood sugar - Reviewed goal A1c, goal fasting, and goal 2 hour post prandial glucose - Patient denies personal or family history of multiple endocrine neoplasia type 2, medullary thyroid  cancer; personal history of pancreatitis or gallbladder disease. - Recommend to check glucose continuously with Dexcom G7  Sliding scale from sister-in-law: 150-200= 2U 201-254= 4U 251-300= 6U 301-350= 8U 351-400= 10U   Follow Up Plan:  -  Follow-up on 07/24/24 for insulin  titration + Dexcom review- appt at 4PM this day too - CONTINUE Trulicity  to 3mg  weekly (started 7/15)- send next dose increase at next visit (will start 8/12)  - Had diarrhea with any Ozempic  doses higher than 0.25mg  weekly  - Reports appetite suppressed with 3mg  dose which is helpful - INCREASE Semglee  66U nightly - Continue  Novolog  at 20U three times a day  - Next A1c check on 07/24/24   Aloysius Lewis, PharmD [place]  Phone Number: [phone]

## 2024-07-10 NOTE — Therapy (Signed)
 OUTPATIENT PHYSICAL THERAPY NEURO treatment      Patient Name: Gabriella Marsh MRN: [id number] DOB:1966/09/16, 58 y.o., female Today's Date: 07/10/2024   PCP: Donzella Lauraine SAILOR, DO REFERRING PROVIDER: Laurence Prentice DASEN, MD  END OF SESSION:  PT End of Session - 07/10/24 1615     Visit Number 19    Number of Visits 25    Date for PT Re-Evaluation 09/04/24    Progress Note Due on Visit 20    PT Start Time 1616    PT Stop Time 1657    PT Time Calculation (min) 41 min    Equipment Utilized During Treatment Gait belt    Activity Tolerance Patient tolerated treatment well    Behavior During Therapy WFL for tasks assessed/performed              Past Medical History:  Diagnosis Date   Arthritis    knees   Blood clotting disorder (HCC)    on toe    Degenerative disc disease, lumbar    Diabetes mellitus without complication (HCC)    GERD (gastroesophageal reflux disease)    Headache    daily - AM (has not been able to have SPG blocks lately)   Hyperlipidemia    Hypertension    Neuropathy    feet   Vertigo    3-4x/yr   Past Surgical History:  Procedure Laterality Date   ABDOMINAL HYSTERECTOMY     But still has cervix   AMPUTATION TOE Right 07/24/2021   Procedure: AMPUTATION TOE;  Surgeon: Neill Boas, DPM;  Location: [facility] ORS;  Service: Podiatry;  Laterality: Right;   CESAREAN SECTION     CHOLECYSTECTOMY     COLONOSCOPY WITH PROPOFOL  N/A 02/20/2022   Procedure: COLONOSCOPY WITH PROPOFOL ;  Surgeon: Therisa Bi, MD;  Location: [facility] ENDOSCOPY;  Service: Gastroenterology;  Laterality: N/A;   left arm frature  10/12/2020   OVARIAN CYST SURGERY     PARATHYROIDECTOMY  03/28/2021   Procedure: PARATHYROIDECTOMY AUTOTRANSPLANT;  Surgeon: Marolyn Nest, MD;  Location: [facility] ORS;  Service: General;;   right femur surgery Right    SHOULDER SURGERY Left 12/26/014   Dr. CECILE Glenn, [facility]   THYROIDECTOMY N/A 03/28/2021   Procedure: THYROIDECTOMY, total;  Surgeon:  Marolyn Nest, MD;  Location: [facility] ORS;  Service: General;  Laterality: N/A;  Provider requesting 3 hours /180 minutes for procedure   TONSILLECTOMY AND ADENOIDECTOMY     UTERINE FIBROID SURGERY     Patient Active Problem List   Diagnosis Date Noted   Neuropathy 04/21/2024   Lymphedema 04/21/2024   Wheelchair dependence 03/10/2023   Hypertension associated with diabetes (HCC) 03/10/2023   AKI (acute kidney injury) (HCC) 01/07/2023   Neurogenic bladder 01/07/2023   FTT (failure to thrive) in adult 12/09/2022   Primary insomnia 09/10/2022   Impaired functional mobility, balance, gait, and endurance 06/09/2022   Type 2 diabetes mellitus with diabetic neuropathy, with long-term current use of insulin  (HCC) 04/24/2022   Hypothyroidism 04/24/2022   Hyperlipidemia associated with type 2 diabetes mellitus (HCC) 04/24/2022   Iron deficiency anemia 04/24/2022   Chronic sensorimotor polyneuropathy with axonal and demyelinating features 01/12/2022   Osteoarthritis of knees (Bilateral) 01/12/2022   Tricompartment osteoarthritis of knees (Bilateral) 01/12/2022    Grade 1 Retrolisthesis (9mm) of L5 over S1 01/12/2022   History of proximal humerus fracture (Left) 01/12/2022   Vitamin D  deficiency 01/12/2022   Chronic peripheral neuropathic pain 01/12/2022   Disorder of skeletal system 11/12/2021   Problems influencing health  status 11/12/2021   Chronic hand pain (1ry area of Pain) (Bilateral) (L>R) 11/12/2021   Chronic feet pain (3ry area of Pain) (Bilateral) (L>R) 11/12/2021   Chronic shoulder pain (4th area of Pain) (Left) 11/12/2021   Chronic knee pain (5th area of Pain) (Bilateral) (L>R) 11/12/2021   Lumbar facet syndrome (Bilateral) 11/12/2021   COVID-19 virus infection 07/22/2021   Chronic pain syndrome 07/17/2021   S/P total thyroidectomy 03/28/2021   Thyroid  nodule    Schamberg's purpura 10/09/2019   Common migraine with intractable migraine 06/18/2015   DDD (degenerative disc  disease), lumbosacral 05/03/2015   Insomnia due to medical condition 05/03/2015   Disorder of peripheral nervous system 05/03/2015   Diabetic peripheral neuropathy (HCC) 03/06/2015   Obesity 03/06/2015   GERD (gastroesophageal reflux disease) 03/06/2015   Multinodular goiter 03/06/2015   RLS (restless legs syndrome) 03/06/2015   Chronic low back pain (2ry area of Pain) (Bilateral) (L>R) w/o sciatica 03/06/2015    ONSET DATE: 06/18/22  REFERRING DIAG: S72.91XD (ICD-10-CM) - Unspecified fracture of right femur, subsequent encounter for closed fracture with routine healing   THERAPY DIAG:  Difficulty in walking, not elsewhere classified  Muscle weakness (generalized)  Other abnormalities of gait and mobility  Unsteadiness on feet  Rationale for Evaluation and Treatment: Rehabilitation  SUBJECTIVE:                                                                                                                                                                                             SUBJECTIVE STATEMENT:  Pt reports that she is feeling alright today. Pt states that she was able to do her exercises twice since last week, and they are a little bit more difficult to do at home.   Pt accompanied by: Spouse   PERTINENT HISTORY: Pt is a pleasant 58 y/o female known to PT clinic, returning for impairments due to R femur fx sustained in 2023. Pt hx includes fall in June 2023 where pt suffered R femur fx followed by surgery where pt had plate and screws placed in R knee and rod in R femur. At the time pt with hx of multiple falls, now pt reports no falls in last 6 months. Since previous d/c from PT on 05/13/2023, pt has been consistently going to [facility] clinic 1x/week, and now returns to [facility] for new year. Pt reports she ambulated with RW while at [facility] clinic, has been able to perform STS to RW from higher surfaces. She would like to continue working on her mobility and strength, including both UE  and LE strength. She still struggles with putting on her shoes and  socks and getting from her bed to the commode. She reports standing at [facility] also limited due to chronic low back pain. She reports she'll likely get injections to tx LBP in future. Pt still performing previous HEP given, typically she completes 1 set of each exercise for 25-30 reps. Says this is easy. PMH per chart significant for arthritis, blood clotting disorder, DDD (lumbar), DM, headache, HTN, neuropathy, vertigo   PAIN:  Are you having pain? Yes: NPRS scale: not numerically rated Pain location: low back, bilat hand pain hurts a lot all the time  Pain description:   Aggravating factors: standing limited due to LBP in position Relieving factors: wearing gloves helps with hand pain  PRECAUTIONS: Fall  RED FLAGS: None   WEIGHT BEARING RESTRICTIONS: No  FALLS: Has patient fallen in last 6 months? No  LIVING ENVIRONMENT: via chart, pt confirms the following is still correct Lives with: lives with their family and lives with their spouse Lives in: House/apartment Stairs: No Has following equipment at home: Environmental consultant - 2 wheeled, Wheelchair (manual), Shower bench, bed side commode, and Grab bars  PLOF: Needs assistance with ADLs  PATIENT GOALS: Get out from bed to walker to bedside commode with or without LE braces donned, improve strength in UE/LE  OBJECTIVE:  Note: Objective measures were completed at Evaluation unless otherwise noted.  DIAGNOSTIC FINDINGS:   Via chart [facility] 05/13/2023 XR Femur 2 views right: Impression  Unchanged position and alignment with further osseous bridging at the distal femur fracture following intramedullary rod fixation. And findings FINDINGS:  Position and alignment of the comminuted distal femur fracture are unchanged after placement of a retrograde interlocking intramedullary rod. Proximal and distal interlocking components articulate appropriately with the rod. There is  progressive osseous bridging with remodeling at the fracture site, there is no new fracture or dislocation.   COGNITION: Overall cognitive status: Within functional limits for tasks assessed   SENSATION: Reports impaired BLE, does have sensation on tops of thighs per report   COORDINATION: WFL UE rapid alt movement WFL LE with heel to shin, but unable to perform rapid alt movement due to weakness in bilat DFs  EDEMA:  Reports no swelling    POSTURE: rounded shoulders and forward head  LOWER EXTREMITY ROM:     Slight impairment with R ankle DF > L with PROM Slight impairment of L knee extension and somewhat painful    LOWER EXTREMITY MMT:    Grossly 4/5 in BLE exception is pt unable to complete DF bilat, other areas of significant mm deficit include hip flexors, abductors and hamstrings bilat  TRANSFERS:  Attempted STS to RW from WC during eval. Unable to perform from wheelchair even with max assist. Pt reports she has been able to complete STS from higher surfaces and this is how she practiced in [facility]  Assistive device utilized: Walker - 2 wheeled  Sit to stand: unable from standard chair height/WC see above  FUNCTIONAL TESTS:  Attempted STS from [facility], unable at this time. Will attempt functional test like 30 sec STS in future as pt able  PATIENT SURVEYS:  LEFS 50/80 FOTO 54  TREATMENT DATE: 07/10/2024  Ther-act:  Gait with RW, CGA, and WC follow: - bout 1: 53ft - bout 2: 79ft   STS with 2WW for UE support in standing x8- pt utilizes arm rests of WC to assist with standing and control descent to sitting   Standing toe taps on 6 step with BUE support x10   Seated:  Heel raise x10 with 1.5# AW donned Toe raise x10- pt unable to lift toes off of ground, palpable muscle contraction  LAQ with 1.5# AW donned 2x10 Marches with 1.5# AW  donned 2x10 Adduction squeeze with isometric hold x15  Adduction squeeze with heel raise x15 RTB abduction 2x15 RTB HS curl 2x15 BLE    Throughout session, SPT provided CGA for safety   PATIENT EDUCATION: Education details: assessment findings, goals, plan, HEP. WC planning HEP expansion  Pt educated throughout session about proper posture and technique with exercises. Improved exercise technique, movement at target joints, use of target muscles after min to mod verbal, visual, tactile cues.  Person educated: Patient and Spouse Education method: Explanation, Verbal cues, and Handouts Education comprehension: verbalized understanding and returned demonstration  HOME EXERCISE PROGRAM: Access Code: [email or web address]  URL: [email or web address] Date: 07/05/2024  Prepared by: Marina  Moser  Exercises  - Standing Anterior Posterior Weight Shift with Chair  - 1 x daily - 7 x weekly - 2 sets - 10 reps - 5 hold  - Standing Weight Shift Side to Side  - 1 x daily - 7 x weekly - 2 sets - 10 reps - 5 hold  - Standing March with Counter Support  - 1 x daily - 7 x weekly - 2 sets - 10 reps - 5 hold  - Standing Toe Taps  - 1 x daily - 7 x weekly - 2 sets - 10 reps - 5 hold  - Squat with Chair and Counter Support  - 1 x daily - 7 x weekly - 2 sets - 10 reps - 5 hold   Access Code: [email or web address] URL: [email or web address] Date: 12/29/2023 Prepared by: Darryle Patten  Exercises - Seated Hamstring Curls with Resistance  - 1 x daily - 5-6 x weekly - 3-4 sets - 20-25 reps - Seated Hip Abduction with Resistance  - 1 x daily - 5-6 x weekly - 3-4 sets - 20-25 reps - Seated March  - 1 x daily - 5-6 x weekly - 3-4 sets - 20-25 reps   Access Code: [email or web address] URL: [email or web address] Date: 01/26/2024 Prepared by: Massie Dollar  Exercises - Supine Heel Slide  - 1 x daily - 7 x weekly - 3 sets - 10 reps - Supine Straight Leg Raises  - 1 x daily - 7 x weekly - 3 sets - 10  reps - Hooklying Clamshell with Resistance  - 1 x daily - 7 x weekly - 3 sets - 10 reps - Supine Hip Flexion  - 1 x daily - 7 x weekly - 3 sets - 10 reps - Supine Single Leg Ankle Pumps  - 1 x daily - 7 x weekly - 3 sets - 10 reps - Supine Bridge  - 1 x daily - 7 x weekly - 3 sets - 10 reps GOALS: Goals reviewed with patient? Yes  SHORT TERM GOALS: Target date: 02/09/2024   Patient will be independent in home exercise program to improve strength/mobility for better functional independence with ADLs. Baseline: initiated Goal status: INITIAL    LONG TERM GOALS: Target date:  09/04/2024  Patient will perform sit to stand with min-mod assist from standard chair height in order to indicate improved lower extremity functional strength and improved ability to perform functional transfers and functional leg activities within her home.  Baseline: mod assist with +2 for safety.  4/2: mod assist 6/23: CGA from wheelchair  Goal status: Partially met   2.  Pt will improve LEFS by 10 points or more in order to indicate improved LE function and mobility.  Baseline: 50 4/2: 32 Goal status: INITIAL  3.  Patient will increase FOTO score to equal to or greater than  59   to demonstrate statistically significant improvement in mobility and quality of life.  Baseline: 54 Goal status: goal d/c'ed.   4.  Pt will require no more than min assist for transfer from bed to bedside commode Baseline: supervision assist with  slide board transfer. To mat table  Using walker for transfer to [facility] with mod assist for safety and  6/23: husband helps supervise and set up with CGA Goal status: MET  5.  The pt will demo ability to don socks and shoes indep or mod I for increased ease with dressing and improved independence Baseline: required max assist from husband  4/2: pt states that she is donning night socks. Is still requiring max assist for LLE and moderate assist for the RLE  6/23; husband requires  max A for socks and shoes Goal status: Progressing  6.  The pt will demo ability to complete 30 second STS test to indicate increased ease with transfers and BLE strength Baseline: 2 sit<>stand from [facility] with cushion in place and  +2 for safety.  4/2: 3 with mod assist and +2 to stabilize RW  6/23: 3 with CGA; +2 to stabilize RW without brace:  With KAFOs on: 3.5 with CGA; +2 to stabilize  Goal status: progressing   7.  Patient will increase 10 meter walk test to >1.43m/s as to improve gait speed for better community ambulation and to reduce fall risk. Baseline: 5/23: 2 min 2 seconds with orthotics on; 1 min 7 seconds without orthotics with wheelchair follow and BrW.  Goal status: NEW   ASSESSMENT:  CLINICAL IMPRESSION:  Pt arrived with motivation to participate and engage in therapy today. Pt able to progress her LE strengthening today despite decreased overall ambulation distance compared to previous session. Pt continues to progress her tolerance to weight bearing and remains motivated to improve her current mobility status. The pt will benefit from further skilled PT to improve these impairments in order to increase ease with ADLs, QOL and to reduce fall risk.   OBJECTIVE IMPAIRMENTS: Abnormal gait, decreased activity tolerance, decreased balance, decreased coordination, decreased endurance, decreased mobility, difficulty walking, decreased ROM, decreased strength, hypomobility, impaired sensation, improper body mechanics, postural dysfunction, and pain.   ACTIVITY LIMITATIONS: carrying, lifting, bending, standing, squatting, stairs, transfers, bed mobility, bathing, toileting, dressing, and locomotion level  PARTICIPATION LIMITATIONS: meal prep, cleaning, laundry, driving, shopping, community activity, and yard work  PERSONAL FACTORS: Fitness, Sex, Time since onset of injury/illness/exacerbation, and 3+ comorbidities: PMH per chart significant for arthritis, blood clotting disorder, DDD  (lumbar), DM, headache, HTN, neuropathy, vertigo are also affecting patient's functional outcome.   REHAB POTENTIAL: Good  CLINICAL DECISION MAKING: Evolving/moderate complexity  EVALUATION COMPLEXITY: Moderate  PLAN:  PT FREQUENCY: 1-2x/week  PT DURATION: 12 weeks  PLANNED INTERVENTIONS: 97164- PT Re-evaluation, 97110-Therapeutic exercises, 97530- Therapeutic activity, 97112- Neuromuscular re-education, 97535- Self Care, 02859- Manual therapy, [id number]- Gait training,  02239- Orthotic Fit/training, 04007- Canalith repositioning, 02967- Electrical stimulation (manual), [id number]- Ultrasound, Patient/Family education, Balance training, Stair training, Taping, Dry Needling, Joint mobilization, Spinal mobilization, Vestibular training, DME instructions, Wheelchair mobility training, Cryotherapy, and Moist heat  PLAN FOR NEXT SESSION:   Continue BLE strengthening.  Gait as tolerated  Ankle AROM/strengthening  Janell Axe, SPT  This entire session was performed under direct supervision and direction of a licensed therapist/therapist assistant . I have personally read, edited and approve of the note as written. Marina  Leopoldo, PT, DPT Physical Therapist - [facility]  Outpatient Physical Therapy- Main Campus [phone]    07/10/2024, 5:04 PM

## 2024-07-12 ENCOUNTER — Encounter: Payer: Self-pay | Admitting: Family Medicine

## 2024-07-13 ENCOUNTER — Other Ambulatory Visit: Payer: Self-pay

## 2024-07-13 DIAGNOSIS — R600 Localized edema: Secondary | ICD-10-CM

## 2024-07-13 MED ORDER — FUROSEMIDE 20 MG PO TABS: 10.0000 mg | ORAL_TABLET | Freq: Every day | ORAL | 3 refills | Status: DC | PRN

## 2024-07-14 ENCOUNTER — Telehealth: Payer: Self-pay | Admitting: Family Medicine

## 2024-07-14 NOTE — Telephone Encounter (Signed)
 [facility]/24 with refills

## 2024-07-14 NOTE — Telephone Encounter (Signed)
 Medication was sent in 06/28/24 with refills.

## 2024-07-14 NOTE — Telephone Encounter (Signed)
 [facility] pharmacy faxed refill request for the following medications:  levothyroxine  (SYNTHROID ) 112 MCG tablet   Please advise

## 2024-07-17 DIAGNOSIS — R069 Unspecified abnormalities of breathing: Secondary | ICD-10-CM | POA: Diagnosis not present

## 2024-07-18 ENCOUNTER — Ambulatory Visit: Admitting: Physical Therapy

## 2024-07-18 DIAGNOSIS — R2681 Unsteadiness on feet: Secondary | ICD-10-CM | POA: Diagnosis not present

## 2024-07-18 DIAGNOSIS — M5459 Other low back pain: Secondary | ICD-10-CM

## 2024-07-18 DIAGNOSIS — M6281 Muscle weakness (generalized): Secondary | ICD-10-CM

## 2024-07-18 DIAGNOSIS — R262 Difficulty in walking, not elsewhere classified: Secondary | ICD-10-CM

## 2024-07-18 DIAGNOSIS — R2689 Other abnormalities of gait and mobility: Secondary | ICD-10-CM | POA: Diagnosis not present

## 2024-07-18 NOTE — Therapy (Unsigned)
 OUTPATIENT PHYSICAL THERAPY NEURO treatment PHYSICAL THERAPY PROGRESS NOTE   Dates of reporting period  03/22/24   to   07/18/2024    Patient Name: Gabriella Marsh MRN: [id number] DOB:07/31/1966, 58 y.o., female Today's Date: 07/18/2024   PCP: Donzella Lauraine SAILOR, DO REFERRING PROVIDER: Laurence Prentice DASEN, MD  END OF SESSION:  PT End of Session - 07/18/24 1616     Visit Number 20    Number of Visits 25    Date for PT Re-Evaluation 09/04/24    Progress Note Due on Visit 20    PT Start Time 1616    PT Stop Time 1656    PT Time Calculation (min) 40 min    Equipment Utilized During Treatment Gait belt    Activity Tolerance Patient tolerated treatment well    Behavior During Therapy WFL for tasks assessed/performed              Past Medical History:  Diagnosis Date   Arthritis    knees   Blood clotting disorder (HCC)    on toe    Degenerative disc disease, lumbar    Diabetes mellitus without complication (HCC)    GERD (gastroesophageal reflux disease)    Headache    daily - AM (has not been able to have SPG blocks lately)   Hyperlipidemia    Hypertension    Neuropathy    feet   Vertigo    3-4x/yr   Past Surgical History:  Procedure Laterality Date   ABDOMINAL HYSTERECTOMY     But still has cervix   AMPUTATION TOE Right 07/24/2021   Procedure: AMPUTATION TOE;  Surgeon: Neill Boas, DPM;  Location: [facility] ORS;  Service: Podiatry;  Laterality: Right;   CESAREAN SECTION     CHOLECYSTECTOMY     COLONOSCOPY WITH PROPOFOL  N/A 02/20/2022   Procedure: COLONOSCOPY WITH PROPOFOL ;  Surgeon: Therisa Bi, MD;  Location: [facility];  Service: Gastroenterology;  Laterality: N/A;   left arm frature  10/12/2020   OVARIAN CYST SURGERY     PARATHYROIDECTOMY  03/28/2021   Procedure: PARATHYROIDECTOMY AUTOTRANSPLANT;  Surgeon: Marolyn Nest, MD;  Location: [facility] ORS;  Service: General;;   right femur surgery Right    SHOULDER SURGERY Left 12/26/014   Dr. CECILE Glenn,  [facility]   THYROIDECTOMY N/A 03/28/2021   Procedure: THYROIDECTOMY, total;  Surgeon: Marolyn Nest, MD;  Location: [facility] OR[facility];  Service: General;  Laterality: N/A;  Provider requesting 3 hours /180 minutes for procedure   TONSILLECTOMY AND ADENOIDECTOMY     UTERINE FIBROID SURGERY     Patient Active Problem List   Diagnosis Date Noted   Neuropathy 04/21/2024   Lymphedema 04/21/2024   Wheelchair dependence 03/10/2023   Hypertension associated with diabetes (HCC) 03/10/2023   AKI (acute kidney injury) (HCC) 01/07/2023   Neurogenic bladder 01/07/2023   FTT (failure to thrive) in adult 12/09/2022   Primary insomnia 09/10/2022   Impaired functional mobility, balance, gait, and endurance 06/09/2022   Type 2 diabetes mellitus with diabetic neuropathy, with long-term current use of insulin  (HCC) 04/24/2022   Hypothyroidism 04/24/2022   Hyperlipidemia associated with type 2 diabetes mellitus (HCC) 04/24/2022   Iron deficiency anemia 04/24/2022   Chronic sensorimotor polyneuropathy with axonal and demyelinating features 01/12/2022   Osteoarthritis of knees (Bilateral) 01/12/2022   Tricompartment osteoarthritis of knees (Bilateral) 01/12/2022    Grade 1 Retrolisthesis (9mm) of L5 over S1 01/12/2022   History of proximal humerus fracture (Left) 01/12/2022   Vitamin D  deficiency 01/12/2022   Chronic  peripheral neuropathic pain 01/12/2022   Disorder of skeletal system 11/12/2021   Problems influencing health status 11/12/2021   Chronic hand pain (1ry area of Pain) (Bilateral) (L>R) 11/12/2021   Chronic feet pain (3ry area of Pain) (Bilateral) (L>R) 11/12/2021   Chronic shoulder pain (4th area of Pain) (Left) 11/12/2021   Chronic knee pain (5th area of Pain) (Bilateral) (L>R) 11/12/2021   Lumbar facet syndrome (Bilateral) 11/12/2021   COVID-19 virus infection 07/22/2021   Chronic pain syndrome 07/17/2021   S/P total thyroidectomy 03/28/2021   Thyroid  nodule    Schamberg's purpura 10/09/2019    Common migraine with intractable migraine 06/18/2015   DDD (degenerative disc disease), lumbosacral 05/03/2015   Insomnia due to medical condition 05/03/2015   Disorder of peripheral nervous system 05/03/2015   Diabetic peripheral neuropathy (HCC) 03/06/2015   Obesity 03/06/2015   GERD (gastroesophageal reflux disease) 03/06/2015   Multinodular goiter 03/06/2015   RLS (restless legs syndrome) 03/06/2015   Chronic low back pain (2ry area of Pain) (Bilateral) (L>R) w/o sciatica 03/06/2015    ONSET DATE: 06/18/22  REFERRING DIAG: S72.91XD (ICD-10-CM) - Unspecified fracture of right femur, subsequent encounter for closed fracture with routine healing   THERAPY DIAG:  Difficulty in walking, not elsewhere classified  Muscle weakness (generalized)  Other abnormalities of gait and mobility  Unsteadiness on feet  Other low back pain  Rationale for Evaluation and Treatment: Rehabilitation  SUBJECTIVE:                                                                                                                                                                                             SUBJECTIVE STATEMENT:  Pt reports that she is feeling alright today. Pt states that she is doing well Overall. Reports that Thursday night was rough reporting that she could hardly sit or stand due to pain in the knees and muscles. Reports that pain decreased and become more manageable with rest and keeping legs elevated throughout the entire day; getting up only for restroom breaks.  Has not been using BLE bracing for mobility, despite continued lack of ankle DF.   Pt accompanied by: Spouse   PERTINENT HISTORY: Pt is a pleasant 58 y/o female known to PT clinic, returning for impairments due to R femur fx sustained in 2023. Pt hx includes fall in June 2023 where pt suffered R femur fx followed by surgery where pt had plate and screws placed in R knee and rod in R femur. At the time pt with hx of multiple  falls, now pt reports no falls in last 6 months. Since previous d/c from PT on 05/13/2023,  pt has been consistently going to [facility] clinic 1x/week, and now returns to [facility] for new year. Pt reports she ambulated with RW while at [facility] clinic, has been able to perform STS to RW from higher surfaces. She would like to continue working on her mobility and strength, including both UE and LE strength. She still struggles with putting on her shoes and socks and getting from her bed to the commode. She reports standing at [facility] also limited due to chronic low back pain. She reports she'll likely get injections to tx LBP in future. Pt still performing previous HEP given, typically she completes 1 set of each exercise for 25-30 reps. Says this is easy. PMH per chart significant for arthritis, blood clotting disorder, DDD (lumbar), DM, headache, HTN, neuropathy, vertigo   PAIN:  Are you having pain? Yes: NPRS scale: not numerically rated Pain location: low back, bilat hand pain hurts a lot all the time  Pain description:   Aggravating factors: standing limited due to LBP in position Relieving factors: wearing gloves helps with hand pain  PRECAUTIONS: Fall  RED FLAGS: None   WEIGHT BEARING RESTRICTIONS: No  FALLS: Has patient fallen in last 6 months? No  LIVING ENVIRONMENT: via chart, pt confirms the following is still correct Lives with: lives with their family and lives with their spouse Lives in: House/apartment Stairs: No Has following equipment at home: Environmental consultant - 2 wheeled, Wheelchair (manual), Shower bench, bed side commode, and Grab bars  PLOF: Needs assistance with ADLs  PATIENT GOALS: Get out from bed to walker to bedside commode with or without LE braces donned, improve strength in UE/LE  OBJECTIVE:  Note: Objective measures were completed at Evaluation unless otherwise noted.  DIAGNOSTIC FINDINGS:   Via chart [facility] care 05/13/2023 XR Femur 2 views right: Impression  Unchanged  position and alignment with further osseous bridging at the distal femur fracture following intramedullary rod fixation. And findings FINDINGS:  Position and alignment of the comminuted distal femur fracture are unchanged after placement of a retrograde interlocking intramedullary rod. Proximal and distal interlocking components articulate appropriately with the rod. There is progressive osseous bridging with remodeling at the fracture site, there is no new fracture or dislocation.   COGNITION: Overall cognitive status: Within functional limits for tasks assessed   SENSATION: Reports impaired BLE, does have sensation on tops of thighs per report   COORDINATION: WFL UE rapid alt movement WFL LE with heel to shin, but unable to perform rapid alt movement due to weakness in bilat DFs  EDEMA:  Reports no swelling    POSTURE: rounded shoulders and forward head  LOWER EXTREMITY ROM:     Slight impairment with R ankle DF > L with PROM Slight impairment of L knee extension and somewhat painful    LOWER EXTREMITY MMT:    Grossly 4/5 in BLE exception is pt unable to complete DF bilat, other areas of significant mm deficit include hip flexors, abductors and hamstrings bilat  TRANSFERS:  Attempted STS to RW from WC during eval. Unable to perform from wheelchair even with max assist. Pt reports she has been able to complete STS from higher surfaces and this is how she practiced in [facility]  Assistive device utilized: Walker - 2 wheeled  Sit to stand: unable from standard chair height/WC see above  FUNCTIONAL TESTS:  Attempted STS from [facility], unable at this time. Marsh attempt functional test like 30 sec STS in future as pt able  PATIENT SURVEYS:  LEFS 50/80 FOTO 54                                                                                                                              TREATMENT DATE: 07/18/2024  PT instructed pt in 30 sec sit<>stand with pad in seat. Pt  completed 4.5 repetitions with UE support pushing from Bil arm rest.   10 Meter Walk Test: Patient instructed to walk 10 meters (32.8 ft) as quickly and as safely as possible at their normal speed x2 and at a fast speed x2. Time measured from 2 meter mark to 8 meter mark to accommodate ramp-up and ramp-down.  Normal speed 1: 55 sec  Normal speed 2: 38 sec   Average Normal speed: 0.21 m/s  Cut off scores: <0.4 m/s = household Ambulator, 0.4-0.8 m/s = limited community Ambulator, >0.8 m/s = community Ambulator, >1.2 m/s = crossing a street, <1.0 = increased fall risk MCID 0.05 m/s (small), 0.13 m/s (moderate), 0.06 m/s (significant)  (ANPTA Core Set of Outcome Measures for Adults with Neurologic Conditions, 2018)  Completed LEFS. 31:   Stand pivot transfer to and from [facility] with CGA and RW to simulate transfer to and from [facility] at home.   Attempted to doff shoes; was unable to sustain figure 4 position due to habitus to remove shoe. Education provided on use of Adaptive equipment for reaching, hook, and shoe horn or sock aid to don/doff shoes.  Marsh need to continue to address ADLs for improved independence with dressing and hygiene tasks.    PATIENT EDUCATION: Education details: assessment findings, goals, plan, HEP. WC planning HEP expansion  Pt educated throughout session about proper posture and technique with exercises. Improved exercise technique, movement at target joints, use of target muscles after min to mod verbal, visual, tactile cues.  Person educated: Patient and Spouse Education method: Explanation, Verbal cues, and Handouts Education comprehension: verbalized understanding and returned demonstration  HOME EXERCISE PROGRAM: Access Code: [email or web address]  URL: [email or web address] Date: 07/05/2024  Prepared by: Marina  Moser  Exercises  - Standing Anterior Posterior Weight Shift with Chair  - 1 x daily - 7 x weekly - 2 sets - 10 reps - 5 hold  - Standing Weight Shift  Side to Side  - 1 x daily - 7 x weekly - 2 sets - 10 reps - 5 hold  - Standing March with Counter Support  - 1 x daily - 7 x weekly - 2 sets - 10 reps - 5 hold  - Standing Toe Taps  - 1 x daily - 7 x weekly - 2 sets - 10 reps - 5 hold  - Squat with Chair and Counter Support  - 1 x daily - 7 x weekly - 2 sets - 10 reps - 5 hold   Access Code: [email or web address] URL: [email or web address] Date: 12/29/2023 Prepared by: Darryle Patten  Exercises - Seated Hamstring Curls with Resistance  - 1 x daily - 5-6 x  weekly - 3-4 sets - 20-25 reps - Seated Hip Abduction with Resistance  - 1 x daily - 5-6 x weekly - 3-4 sets - 20-25 reps - Seated March  - 1 x daily - 5-6 x weekly - 3-4 sets - 20-25 reps   Access Code: [email or web address] URL: [email or web address] Date: 01/26/2024 Prepared by: Massie Dollar  Exercises - Supine Heel Slide  - 1 x daily - 7 x weekly - 3 sets - 10 reps - Supine Straight Leg Raises  - 1 x daily - 7 x weekly - 3 sets - 10 reps - Hooklying Clamshell with Resistance  - 1 x daily - 7 x weekly - 3 sets - 10 reps - Supine Hip Flexion  - 1 x daily - 7 x weekly - 3 sets - 10 reps - Supine Single Leg Ankle Pumps  - 1 x daily - 7 x weekly - 3 sets - 10 reps - Supine Bridge  - 1 x daily - 7 x weekly - 3 sets - 10 reps GOALS: Goals reviewed with patient? Yes  SHORT TERM GOALS: Target date: 02/09/2024   Patient Marsh be independent in home exercise program to improve strength/mobility for better functional independence with ADLs. Baseline: initiated Goal status:MET    LONG TERM GOALS: Target date:  09/04/2024     Patient Marsh perform sit to stand with min-mod assist from standard chair height in order to indicate improved lower extremity functional strength and improved ability to perform functional transfers and functional leg activities within her home.  Baseline: mod assist with +2 for safety.  4/2: mod assist 6/23: CGA from wheelchair  7/29: CGA from [facility] with RW   Goal status: MET   2.  Pt Marsh improve LEFS by 10 points or more in order to indicate improved LE function and mobility.  Baseline: 50 4/2: 32 7/29: 31  Goal status: progressing   3.  Patient Marsh increase FOTO score to equal to or greater than  59   to demonstrate statistically significant improvement in mobility and quality of life.  Baseline: 54 Goal status: goal d/c'ed.   4.  Pt Marsh require no more than min assist for transfer from bed to bedside commode Baseline: supervision assist with  slide board transfer. To mat table  Using walker for transfer to [facility] with mod assist for safety and  6/23: husband helps supervise and set up with CGA 7/29: able to transfer to and from [facility] with RW without assist from Husband  Goal status: MET  5.  The pt Marsh demo ability to don socks and shoes indep or mod I for increased ease with dressing and improved independence Baseline: required max assist from husband  4/2: pt states that she is donning night socks. Is still requiring max assist for LLE and moderate assist for the RLE  6/23; husband requires max A for socks and shoes 7/29: reports that she attempted to put on socks and shoes, without success due to strength and ROm deficits in Bil hips; but was  Goal status: Progressing  6.  The pt Marsh demo ability to complete 30 second STS test to indicate increased ease with transfers and BLE strength Baseline: 2 sit<>stand from [facility] with cushion in place and  +2 for safety.  4/2: 3 with mod assist and +2 to stabilize RW  6/23: 3 with CGA; +2 to stabilize RW without brace. With KAFOs on: 3.5 with CGA; +2 to stabilize  7/29: 4.5 with RW and CGA  from arm chair with airex pad.  Goal status: progressing   7.  Patient Marsh increase 10 meter walk test to >1.35m/s as to improve gait speed for better community ambulation and to reduce fall risk. Baseline: 5/23: 2 min 2 seconds with orthotics on; 1 min 7 seconds without orthotics with wheelchair follow and  BrW.  7/29: Average: 46.79  sec 0.41m/s  with RW and WC follow , no Bracing Goal status: NEW   ASSESSMENT:  CLINICAL IMPRESSION:  Pt arrived with motivation to participate and engage in therapy today for progress note and LTG assessment. Pt demonstrates improved independence with ADL, as she has been performing BSC transfers without assist from husband while at home. She also has improved access to home environment with improved tolerance to standing and increased distance with ambulation and improved gait speed to 0.70m/s with CGA for safety and RW. Marsh need to continue to address ADLs and improved independce with dressing and hygiene tasks.Patient's condition has the potential to improve in response to therapy. Maximum improvement is yet to be obtained. The anticipated improvement is attainable and reasonable in a generally predictable time.  Pt continues to progress her tolerance to weight bearing and remains motivated to improve her current mobility status. The pt Marsh benefit from further skilled PT to improve these impairments in order to increase ease with ADLs, QOL and to reduce fall risk.   OBJECTIVE IMPAIRMENTS: Abnormal gait, decreased activity tolerance, decreased balance, decreased coordination, decreased endurance, decreased mobility, difficulty walking, decreased ROM, decreased strength, hypomobility, impaired sensation, improper body mechanics, postural dysfunction, and pain.   ACTIVITY LIMITATIONS: carrying, lifting, bending, standing, squatting, stairs, transfers, bed mobility, bathing, toileting, dressing, and locomotion level  PARTICIPATION LIMITATIONS: meal prep, cleaning, laundry, driving, shopping, community activity, and yard work  PERSONAL FACTORS: Fitness, Sex, Time since onset of injury/illness/exacerbation, and 3+ comorbidities: PMH per chart significant for arthritis, blood clotting disorder, DDD (lumbar), DM, headache, HTN, neuropathy, vertigo are also affecting  patient's functional outcome.   REHAB POTENTIAL: Good  CLINICAL DECISION MAKING: Evolving/moderate complexity  EVALUATION COMPLEXITY: Moderate  PLAN:  PT FREQUENCY: 1-2x/week  PT DURATION: 12 weeks  PLANNED INTERVENTIONS: [id number]- PT Re-evaluation, 97110-Therapeutic exercises, 97530- Therapeutic activity, [id number]- Neuromuscular re-education, 97535- Self Care, [id number]- Manual therapy, [id number]- Gait training, [phone]- Orthotic Fit/training, [phone]- Canalith repositioning, [id number]- Electrical stimulation (manual), [id number]- Ultrasound, Patient/Family education, Balance training, Stair training, Taping, Dry Needling, Joint mobilization, Spinal mobilization, Vestibular training, DME instructions, Wheelchair mobility training, Cryotherapy, and Moist heat  PLAN FOR NEXT SESSION:   Continue BLE strengthening.  Gait as tolerated  Ankle AROM/strengthening    Massie Dollar PT, DPT  Physical Therapist - [facility]  [facility]  4:16 PM 07/18/24

## 2024-07-19 ENCOUNTER — Ambulatory Visit

## 2024-07-19 ENCOUNTER — Ambulatory Visit: Payer: Self-pay

## 2024-07-19 DIAGNOSIS — G4733 Obstructive sleep apnea (adult) (pediatric): Secondary | ICD-10-CM

## 2024-07-23 ENCOUNTER — Encounter: Payer: Self-pay | Admitting: Family Medicine

## 2024-07-24 ENCOUNTER — Ambulatory Visit (INDEPENDENT_AMBULATORY_CARE_PROVIDER_SITE_OTHER): Admitting: Family Medicine

## 2024-07-24 ENCOUNTER — Other Ambulatory Visit: Payer: Self-pay | Admitting: Pharmacist

## 2024-07-24 ENCOUNTER — Encounter: Payer: Self-pay | Admitting: Family Medicine

## 2024-07-24 VITALS — BP 126/79 | HR 79 | Ht 69.0 in

## 2024-07-24 DIAGNOSIS — E039 Hypothyroidism, unspecified: Secondary | ICD-10-CM | POA: Diagnosis not present

## 2024-07-24 DIAGNOSIS — E1140 Type 2 diabetes mellitus with diabetic neuropathy, unspecified: Secondary | ICD-10-CM

## 2024-07-24 DIAGNOSIS — R600 Localized edema: Secondary | ICD-10-CM | POA: Diagnosis not present

## 2024-07-24 DIAGNOSIS — G4733 Obstructive sleep apnea (adult) (pediatric): Secondary | ICD-10-CM

## 2024-07-24 DIAGNOSIS — Z794 Long term (current) use of insulin: Secondary | ICD-10-CM | POA: Diagnosis not present

## 2024-07-24 DIAGNOSIS — E1159 Type 2 diabetes mellitus with other circulatory complications: Secondary | ICD-10-CM

## 2024-07-24 DIAGNOSIS — I152 Hypertension secondary to endocrine disorders: Secondary | ICD-10-CM

## 2024-07-24 MED ORDER — INSULIN ASPART 100 UNIT/ML FLEXPEN: PEN_INJECTOR | SUBCUTANEOUS | 1 refills | Status: DC

## 2024-07-24 MED ORDER — DULAGLUTIDE 4.5 MG/0.5ML ~~LOC~~ SOAJ: 4.5000 mg | SUBCUTANEOUS | 3 refills | Status: DC

## 2024-07-24 NOTE — Progress Notes (Signed)
   07/24/2024  Patient ID: Gabriella Marsh, female   DOB: 01-22-1966, 58 y.o.   MRN: [id number]  Saw note regarding inability to reach the patient on the phone due to connectivity issues at this time. Will see how visit with Dr. Donzella goes today and try again next week.   Aloysius Lewis, PharmD [facility]  Phone Number: [phone]

## 2024-07-24 NOTE — Patient Instructions (Signed)
 Recommended vaccines: Hepatitis B series (2 or 3 shot series) and, in late September or October, the flu shot.

## 2024-07-24 NOTE — Progress Notes (Signed)
 Established patient visit   Patient: Gabriella Marsh   DOB: Jan 21, 1966   59 y.o. Female  MRN: [id number] Visit Date: 07/24/2024  Today's healthcare provider: LAURAINE LOISE BUOY, DO   Chief Complaint  Patient presents with   Medical Management of Chronic Issues   Diabetes   Hypertension   Subjective    HPI Gabriella Marsh is a 58 year old female with type 2 diabetes who presents for diabetes management and medication review.  She has been experiencing fluctuations in her blood sugar levels, with a recent reading of 186 mg/dL and an improved J8r of 8.1%. She is actively working on dietary modifications to identify and avoid foods that trigger blood sugar spikes. She uses a Dexcom device for glucose monitoring, although she has encountered issues with device deployment, which have been addressed with replacements.  Her current medication regimen includes Trulicity  3 mg weekly, with one pen left and is due for her next dose tomorrow. She tolerates Trulicity  well without adverse effects. She also uses Novolog , taking 20 units 3 times daily with meals, and Semglee , taking 62 units at night. She has missed some mealtime doses due to not having her insulin  while eating out but has started carrying a gel pack to keep her insulin  with her. She takes metformin  regularly and is focusing on managing her diet, particularly in the mornings. Her partner reminds her to take her insulin  before meals.  She is on atorvastatin , furosemide , and lisinopril  2.5 mg, the latter most of which she takes a few times a week based on her blood pressure readings to avoid hypotension. Her blood pressure has been stable, averaging around 126/79 mmHg. She has a history of sleep apnea and is awaiting a CPAP machine after a recent sleep study confirmed her condition. She has not had the hepatitis B vaccine series. She has had two COVID-19 vaccines but no boosters.  She manages chronic pain with gabapentin  and has  been discussing back injections with her pain management team. She has experienced issues with her pain medication being sent to the wrong pharmacy, causing delays. She is due for a Pap smear and breast exam but has had difficulty scheduling these due to her partner's work schedule and her therapy sessions. She uses a walker for mobility, as tolerated and otherwise uses a wheelchair.    Dexcom Data:       Medications: Outpatient Medications Prior to Visit  Medication Sig Note   acetaminophen  (TYLENOL ) 500 MG tablet Take 1,000 mg by mouth every 12 (twelve) hours as needed for moderate pain.    atorvastatin  (LIPITOR) 20 MG tablet TAKE 1 TABLET(20 MG) BY MOUTH DAILY    cyclobenzaprine  (FLEXERIL ) 5 MG tablet Take 1 tablet (5 mg total) by mouth 3 (three) times daily as needed for muscle spasms.    fluticasone  (FLONASE ) 50 MCG/ACT nasal spray Place 2 sprays into both nostrils daily.    furosemide  (LASIX ) 20 MG tablet Take 0.5 tablets (10 mg total) by mouth daily as needed for edema.    gabapentin  (NEURONTIN ) 800 MG tablet Take 1 tablet (800 mg total) by mouth 3 (three) times daily.    glucose blood (ACCU-CHEK GUIDE TEST) test strip Use to check blood sugar up to 3 times a day with insulin  use. Dx code: E11.41    insulin  glargine-yfgn (SEMGLEE ) 100 UNIT/ML Pen Inject up to 75 units daily. Titrate as directed by PCP. Dx code E11.40. Inactivate vial script.    Insulin  Pen  Needle 30G X 5 MM MISC Use to inject insulin  4 times per day as prescribed. Dx code: E11.40    Insulin  Syringe-Needle U-100 (INSULIN  SYRINGE 1CC/31GX5/16) 31G X 5/16 1 ML MISC Use with Semglee  vial once a day. Dx code E11.40    levothyroxine  (SYNTHROID ) 112 MCG tablet Take 2 tablets (224 mcg total) by mouth daily.    lisinopril  (ZESTRIL ) 2.5 MG tablet Take 1 tablet (2.5 mg total) by mouth daily.    Melatonin 10 MG TABS Take 10 mg by mouth at bedtime.    metFORMIN  (GLUCOPHAGE ) 500 MG tablet Take 2 tablets (1,000 mg total) by  mouth 2 (two) times daily with a meal.    nortriptyline  (PAMELOR ) 50 MG capsule Take 1 capsule (50 mg total) by mouth at bedtime.    nystatin -triamcinolone  ointment (MYCOLOG) Apply 1 Application topically 2 (two) times daily.    omeprazole  (PRILOSEC) 40 MG capsule Take 1 capsule (40 mg total) by mouth daily.    oxyCODONE -acetaminophen  (PERCOCET) 5-325 MG tablet Take 1 tablet by mouth every 8 (eight) hours. Must last 30 days.    [START ON 07/26/2024] oxyCODONE -acetaminophen  (PERCOCET) 5-325 MG tablet Take 1 tablet by mouth every 8 (eight) hours. Must last 30 days.    [START ON 08/25/2024] oxyCODONE -acetaminophen  (PERCOCET) 5-325 MG tablet Take 1 tablet by mouth every 8 (eight) hours. Must last 30 days.    traZODone  (DESYREL ) 100 MG tablet Take 1 tablet (100 mg total) by mouth at bedtime.    Vitamin D , Ergocalciferol , (DRISDOL ) 1.25 MG (50000 UNIT) CAPS capsule Take 1 capsule (50,000 Units total) by mouth every 7 (seven) days.    [DISCONTINUED] Dulaglutide  (TRULICITY ) 3 MG/0.5ML SOAJ Inject 3 mg as directed once a week.    [DISCONTINUED] insulin  aspart (NOVOLOG ) 100 UNIT/ML FlexPen Inject 20 units three times a day with meals with correction insulin  if needed. Hold if not eating. Max total daily dose of 75 units.    [DISCONTINUED] insulin  glargine-yfgn (SEMGLEE , YFGN,) 100 UNIT/ML injection Inject up to 75 units daily. Titrate as directed by PCP. 07/24/2024: prefers to use pens   No facility-administered medications prior to visit.        Objective    BP 126/79 (BP Location: Right Arm, Patient Position: Sitting, Cuff Size: Normal)   Pulse 79   Ht 5' 9 (1.753 m)   SpO2 98%   BMI 36.18 kg/m     Physical Exam Vitals and nursing note reviewed.  Constitutional:      General: She is not in acute distress.    Appearance: Normal appearance.  HENT:     Head: Normocephalic and atraumatic.  Eyes:     General: No scleral icterus.    Conjunctiva/sclera: Conjunctivae normal.  Cardiovascular:      Rate and Rhythm: Normal rate.  Pulmonary:     Effort: Pulmonary effort is normal.  Neurological:     Mental Status: She is alert and oriented to person, place, and time. Mental status is at baseline.  Psychiatric:        Mood and Affect: Mood normal.        Behavior: Behavior normal.      No results found for any visits on 07/24/24.  Assessment & Plan    Type 2 diabetes mellitus with diabetic neuropathy, with long-term current use of insulin  (HCC) -     Comprehensive metabolic panel with GFR -     Hemoglobin A1c -     Lipid panel -     Dulaglutide ; Inject 4.5  mg into the skin once a week.  Dispense: 2 mL; Refill: 3 -     Insulin  Aspart; Inject 20 units three times a day with meals with correction insulin  if needed. Hold if not eating. Max total daily dose of 75 units.  Dispense: 30 mL; Refill: 1  Acquired hypothyroidism -     TSH Rfx on Abnormal to Free T4  Hypertension associated with diabetes (HCC)  Bilateral lower extremity edema  Obstructive sleep apnea       Type 2 diabetes mellitus Improved glycemic control with A1c at 8.1. Blood glucose fluctuates. No hypoglycemia. Tolerating Trulicity .  Previously on Ozempic , which hurt her stomach and caused diarrhea.  Challenges with consistent insulin  administration noted. - Send prescription for Trulicity  4.5 mg. - Send prescription for Novolog , for 20 units with meals. - Continue Semglee  62 units at night, discuss increase to 66 units with Raven next Monday, as patient did not understand she was supposed to have increased to previous. - Continue Metformin  as prescribed. - Educated on dietary triggers, encouraged blood glucose monitoring.  Hypothyroidism Chronic, improved.  Patient denies symptoms of hypothyroidism and hyperthyroidism today.  Continue levothyroxine  224 mcg daily and recheck TSH today.  Hypertension Chronic, stable.  Well-controlled with lisinopril  2.5 mg as needed. Stable blood pressure readings. - Continue  lisinopril  2.5 mg as needed based on blood pressure. - Monitor blood pressure regularly.  Bilateral lower extremity edema Managed with furosemide  as needed. Symptom improvement reported. - Continue furosemide  as needed based on blood pressure and edema symptoms.  Obstructive sleep Apnea Requires CPAP therapy. Awaiting CPAP machine delivery. - Await delivery of CPAP machine.    Patient did eat today (prior to today's blood work).  Return in about 3 months (around 10/24/2024) for Chronic f/u, Pap.      I discussed the assessment and treatment plan with the patient  The patient was provided an opportunity to ask questions and all were answered. The patient agreed with the plan and demonstrated an understanding of the instructions.   The patient was advised to call back or seek an in-person evaluation if the symptoms worsen or if the condition fails to improve as anticipated.    LAURAINE LOISE BUOY, DO  [facility] [phone] (phone) [phone] (fax)  [facility]

## 2024-07-25 ENCOUNTER — Ambulatory Visit

## 2024-07-25 DIAGNOSIS — E1165 Type 2 diabetes mellitus with hyperglycemia: Secondary | ICD-10-CM | POA: Diagnosis not present

## 2024-07-25 LAB — COMPREHENSIVE METABOLIC PANEL WITH GFR
ALT: 27 IU/L (ref 0–32)
AST: 22 IU/L (ref 0–40)
Albumin: 4.2 g/dL (ref 3.8–4.9)
Alkaline Phosphatase: 77 IU/L (ref 44–121)
BUN/Creatinine Ratio: 23 (ref 9–23)
BUN: 15 mg/dL (ref 6–24)
Bilirubin Total: 0.8 mg/dL (ref 0.0–1.2)
CO2: 25 mmol/L (ref 20–29)
Calcium: 8.9 mg/dL (ref 8.7–10.2)
Chloride: 97 mmol/L (ref 96–106)
Creatinine, Ser: 0.66 mg/dL (ref 0.57–1.00)
Globulin, Total: 2.5 g/dL (ref 1.5–4.5)
Glucose: 151 mg/dL — ABNORMAL HIGH (ref 70–99)
Potassium: 5.1 mmol/L (ref 3.5–5.2)
Sodium: 139 mmol/L (ref 134–144)
Total Protein: 6.7 g/dL (ref 6.0–8.5)
eGFR: 102 mL/min/1.73 (ref >59)

## 2024-07-25 LAB — LIPID PANEL
Chol/HDL Ratio: 3.9 ratio (ref 0.0–4.4)
Cholesterol, Total: 135 mg/dL (ref 100–199)
HDL: 35 mg/dL — ABNORMAL LOW (ref >39)
LDL Chol Calc (NIH): 59 mg/dL (ref 0–99)
Triglycerides: 255 mg/dL — ABNORMAL HIGH (ref 0–149)
VLDL Cholesterol Cal: 41 mg/dL — ABNORMAL HIGH (ref 5–40)

## 2024-07-25 LAB — T4F: T4,Free (Direct): 1.76 ng/dL (ref 0.82–1.77)

## 2024-07-25 LAB — HEMOGLOBIN A1C
Est. average glucose Bld gHb Est-mCnc: 189 mg/dL
Hgb A1c MFr Bld: 8.2 % — ABNORMAL HIGH (ref 4.8–5.6)

## 2024-07-25 LAB — TSH RFX ON ABNORMAL TO FREE T4: TSH: 14.2 u[IU]/mL — ABNORMAL HIGH (ref 0.450–4.500)

## 2024-07-27 ENCOUNTER — Encounter: Payer: Self-pay | Admitting: Nurse Practitioner

## 2024-07-27 ENCOUNTER — Encounter: Payer: Self-pay | Admitting: Family Medicine

## 2024-07-27 ENCOUNTER — Ambulatory Visit: Payer: Self-pay | Admitting: Family Medicine

## 2024-07-27 ENCOUNTER — Other Ambulatory Visit: Payer: Self-pay | Admitting: Nurse Practitioner

## 2024-07-27 DIAGNOSIS — G894 Chronic pain syndrome: Secondary | ICD-10-CM

## 2024-07-27 DIAGNOSIS — E039 Hypothyroidism, unspecified: Secondary | ICD-10-CM

## 2024-07-27 DIAGNOSIS — E1142 Type 2 diabetes mellitus with diabetic polyneuropathy: Secondary | ICD-10-CM

## 2024-07-29 MED ORDER — LEVOTHYROXINE SODIUM 125 MCG PO TABS: 125.0000 ug | ORAL_TABLET | Freq: Every day | ORAL | 3 refills | Status: DC

## 2024-07-31 ENCOUNTER — Other Ambulatory Visit: Payer: Self-pay | Admitting: Pharmacist

## 2024-07-31 NOTE — Progress Notes (Signed)
 07/31/2024 Name: September Mormile MRN: [id number] DOB: Aug 05, 1966  Chief Complaint  Patient presents with   Diabetes    Gabriella Marsh is a 58 y.o. year old female who presented for a telephone visit.   They were referred to the pharmacist by their PCP for assistance in managing diabetes.   Referral for DM control  ER 3 weeks ago for leg swelling and high BG's- found out Dexcom was not working  Subjective:  Care Team: Primary Care Provider: Donzella Lauraine SAILOR, DO ; Next Scheduled Visit: 10/24/24 Clinical Pharmacist: Aloysius Lewis, PharmD  Medication Access/Adherence  Current Pharmacy:  [facility] [phone] - [place], [place] - [address] AT [facility] [address] [place] [address] Phone: [phone] Fax: [phone]  [facility] - [place], [place] - [address] [place] [address] Phone: [phone] Fax: [phone]   Patient reports affordability concerns with their medications: No  Patient reports access/transportation concerns to their pharmacy: No  Patient reports adherence concerns with their medications:  No     Diabetes:  Current medications: Trulicity  4.5mg  weekly, Semglee  66U at bedtime, Novolog  20U with meals, Metformin  1000mg  twice a day (max dose) Medications tried in the past: Glipizide  (stopped insulin ), Trulicity  1.5mg  weekly (2023 due to shortage)   Using Accu-chek meter; testing 0 times daily (needs to find) *Currently uses Dexcom G7  8/11 visit:    07/10/24 visit:  06/26/24 visit:   06/01/24 visit:   05/29/24 visit:    05/24/24 visit:   05/19/24 visit:    5/27 visit:   5/13 visit:    Patient denies hypoglycemic s/sx including dizziness, shakiness, sweating. Patient denies hyperglycemic symptoms including polyuria, polydipsia, polyphagia, nocturia, neuropathy, blurred vision.  Current meal patterns:  - Breakfast (5:30AM): Bowl of  cheerios + cup of coffee  - Lunch (12-1PM): Mrs. Joselyn frozen foods prior, leftover now - Supper (5-6:30PM): Hot dogs, crockpot potato soup, eat out a good bit; goal of baked chicken + Mrs. Dash seasoning, rice or mac'n'cheese weekly - Snacks: Half an orange or banana - Drinks: Water, Splenda tea (half large tumbler daily) *Husband doesn't know how to cook- mostly microwavable items  Current physical activity: Can't walk- uses wheelchair  Current medication access support: [employer]   Objective:  Lab Results  Component Value Date   HGBA1C 8.2 (H) 07/24/2024    Lab Results  Component Value Date   CREATININE 0.66 07/24/2024   BUN 15 07/24/2024   NA 139 07/24/2024   K 5.1 07/24/2024   CL 97 07/24/2024   CO2 25 07/24/2024    Lab Results  Component Value Date   CHOL 135 07/24/2024   HDL 35 (L) 07/24/2024   LDLCALC 59 07/24/2024   TRIG 255 (H) 07/24/2024   CHOLHDL 3.9 07/24/2024    Medications Reviewed Today   Medications were not reviewed in this encounter       Assessment/Plan:   Diabetes: - Currently uncontrolled - Reviewed long term cardiovascular and renal outcomes of uncontrolled blood sugar - Reviewed goal A1c, goal fasting, and goal 2 hour post prandial glucose - Patient denies personal or family history of multiple endocrine neoplasia type 2, medullary thyroid  cancer; personal history of pancreatitis or gallbladder disease. - Recommend to check glucose continuously with Dexcom G7   Follow Up Plan:  - Follow-up on 08/14/24 for insulin  titration + Dexcom review - CONTINUE Trulicity  to 4.5mg   weekly- will monitor for itching with second dose tomorrow (will notify us  if the itching returns; said it could potentially be the detergent too)  - Had diarrhea with any Ozempic  doses higher than 0.25mg  weekly  - Reports appetite suppressed with 3mg  dose which is helpful - Continue Semglee  66U nightly  - Said they are having manufacturer issues with Semglee   pens currently, so continuing with vials for now - Continue Novolog  at 20U three times a day  Update after visit: Patient notified to take 1 tab 112mcg + 1 tab 125mc (total 237 mcg daily) Confirmed understanding and does not need a refill of Levothyroxine  at this time   Aloysius Lewis, PharmD [facility]  Phone Number: [phone]

## 2024-08-01 ENCOUNTER — Ambulatory Visit: Attending: Orthopedic Surgery

## 2024-08-01 DIAGNOSIS — R2689 Other abnormalities of gait and mobility: Secondary | ICD-10-CM | POA: Insufficient documentation

## 2024-08-01 DIAGNOSIS — R262 Difficulty in walking, not elsewhere classified: Secondary | ICD-10-CM | POA: Insufficient documentation

## 2024-08-01 DIAGNOSIS — R2681 Unsteadiness on feet: Secondary | ICD-10-CM | POA: Insufficient documentation

## 2024-08-01 DIAGNOSIS — M5459 Other low back pain: Secondary | ICD-10-CM | POA: Diagnosis not present

## 2024-08-01 DIAGNOSIS — M6281 Muscle weakness (generalized): Secondary | ICD-10-CM | POA: Diagnosis not present

## 2024-08-01 NOTE — Therapy (Signed)
 OUTPATIENT PHYSICAL THERAPY NEURO TREATMENT    Patient Name: Gabriella Marsh MRN: [id number] DOB:1966/07/04, 58 y.o., female Today's Date: 08/01/2024   PCP: Donzella Lauraine SAILOR, DO REFERRING PROVIDER: Laurence Prentice DASEN, MD  END OF SESSION:  PT End of Session - 08/01/24 1619     Visit Number 21    Number of Visits 25    Date for PT Re-Evaluation 09/04/24    Progress Note Due on Visit 20    PT Start Time 1622    PT Stop Time 1703    PT Time Calculation (min) 41 min    Equipment Utilized During Treatment Gait belt    Activity Tolerance Patient tolerated treatment well    Behavior During Therapy WFL for tasks assessed/performed         Past Medical History:  Diagnosis Date   Arthritis    knees   Blood clotting disorder (HCC)    on toe    Degenerative disc disease, lumbar    Diabetes mellitus without complication (HCC)    GERD (gastroesophageal reflux disease)    Headache    daily - AM (has not been able to have SPG blocks lately)   Hyperlipidemia    Hypertension    Neuropathy    feet   Vertigo    3-4x/yr   Past Surgical History:  Procedure Laterality Date   ABDOMINAL HYSTERECTOMY     But still has cervix   AMPUTATION TOE Right 07/24/2021   Procedure: AMPUTATION TOE;  Surgeon: Neill Boas, DPM;  Location: [facility] ORS;  Service: Podiatry;  Laterality: Right;   CESAREAN SECTION     CHOLECYSTECTOMY     COLONOSCOPY WITH PROPOFOL  N/A 02/20/2022   Procedure: COLONOSCOPY WITH PROPOFOL ;  Surgeon: Therisa Bi, MD;  Location: [facility] ENDOSCOPY;  Service: Gastroenterology;  Laterality: N/A;   left arm frature  10/12/2020   OVARIAN CYST SURGERY     PARATHYROIDECTOMY  03/28/2021   Procedure: PARATHYROIDECTOMY AUTOTRANSPLANT;  Surgeon: Marolyn Nest, MD;  Location: [facility] ORS;  Service: General;;   right femur surgery Right    SHOULDER SURGERY Left 12/26/014   Dr. CECILE Glenn, [facility]   THYROIDECTOMY N/A 03/28/2021   Procedure: THYROIDECTOMY, total;  Surgeon: Marolyn Nest,  MD;  Location: [facility] ORS;  Service: General;  Laterality: N/A;  Provider requesting 3 hours /180 minutes for procedure   TONSILLECTOMY AND ADENOIDECTOMY     UTERINE FIBROID SURGERY     Patient Active Problem List   Diagnosis Date Noted   Neuropathy 04/21/2024   Lymphedema 04/21/2024   Wheelchair dependence 03/10/2023   Hypertension associated with diabetes (HCC) 03/10/2023   AKI (acute kidney injury) (HCC) 01/07/2023   Neurogenic bladder 01/07/2023   FTT (failure to thrive) in adult 12/09/2022   Primary insomnia 09/10/2022   Impaired functional mobility, balance, gait, and endurance 06/09/2022   Type 2 diabetes mellitus with diabetic neuropathy, with long-term current use of insulin  (HCC) 04/24/2022   Hypothyroidism 04/24/2022   Hyperlipidemia associated with type 2 diabetes mellitus (HCC) 04/24/2022   Iron deficiency anemia 04/24/2022   Chronic sensorimotor polyneuropathy with axonal and demyelinating features 01/12/2022   Osteoarthritis of knees (Bilateral) 01/12/2022   Tricompartment osteoarthritis of knees (Bilateral) 01/12/2022    Grade 1 Retrolisthesis (9mm) of L5 over S1 01/12/2022   History of proximal humerus fracture (Left) 01/12/2022   Vitamin D  deficiency 01/12/2022   Chronic peripheral neuropathic pain 01/12/2022   Disorder of skeletal system 11/12/2021   Problems influencing health status 11/12/2021   Chronic hand pain (  1ry area of Pain) (Bilateral) (L>R) 11/12/2021   Chronic feet pain (3ry area of Pain) (Bilateral) (L>R) 11/12/2021   Chronic shoulder pain (4th area of Pain) (Left) 11/12/2021   Chronic knee pain (5th area of Pain) (Bilateral) (L>R) 11/12/2021   Lumbar facet syndrome (Bilateral) 11/12/2021   COVID-19 virus infection 07/22/2021   Chronic pain syndrome 07/17/2021   S/P total thyroidectomy 03/28/2021   Thyroid  nodule    Schamberg's purpura 10/09/2019   Common migraine with intractable migraine 06/18/2015   DDD (degenerative disc disease), lumbosacral  05/03/2015   Insomnia due to medical condition 05/03/2015   Disorder of peripheral nervous system 05/03/2015   Diabetic peripheral neuropathy (HCC) 03/06/2015   Obesity 03/06/2015   GERD (gastroesophageal reflux disease) 03/06/2015   Multinodular goiter 03/06/2015   RLS (restless legs syndrome) 03/06/2015   Chronic low back pain (2ry area of Pain) (Bilateral) (L>R) w/o sciatica 03/06/2015    ONSET DATE: 06/18/22  REFERRING DIAG: S72.91XD (ICD-10-CM) - Unspecified fracture of right femur, subsequent encounter for closed fracture with routine healing   THERAPY DIAG:  Difficulty in walking, not elsewhere classified  Muscle weakness (generalized)  Other abnormalities of gait and mobility  Unsteadiness on feet  Other low back pain  Rationale for Evaluation and Treatment: Rehabilitation  SUBJECTIVE:                                                                                                                                                                                             SUBJECTIVE STATEMENT:  Pt reports she was unable to come to therapy last week due to being by herself.  Pt denies pain at this time as well.  Pt notes some swelling in the LE's, but has been put on a diuretic.    Pt accompanied by: Spouse   PERTINENT HISTORY: Pt is a pleasant 58 y/o female known to PT clinic, returning for impairments due to R femur fx sustained in 2023. Pt hx includes fall in June 2023 where pt suffered R femur fx followed by surgery where pt had plate and screws placed in R knee and rod in R femur. At the time pt with hx of multiple falls, now pt reports no falls in last 6 months. Since previous d/c from PT on 05/13/2023, pt has been consistently going to [facility] clinic 1x/week, and now returns to [facility] for new year. Pt reports she ambulated with RW while at [facility] clinic, has been able to perform STS to RW from higher surfaces. She would like to continue working on her mobility and strength,  including both UE and LE strength. She still struggles with  putting on her shoes and socks and getting from her bed to the commode. She reports standing at [facility] also limited due to chronic low back pain. She reports she'll likely get injections to tx LBP in future. Pt still performing previous HEP given, typically she completes 1 set of each exercise for 25-30 reps. Says this is easy. PMH per chart significant for arthritis, blood clotting disorder, DDD (lumbar), DM, headache, HTN, neuropathy, vertigo   PAIN:  Are you having pain? Yes: NPRS scale: not numerically rated Pain location: low back, bilat hand pain hurts a lot all the time  Pain description:   Aggravating factors: standing limited due to LBP in position Relieving factors: wearing gloves helps with hand pain  PRECAUTIONS: Fall  RED FLAGS: None   WEIGHT BEARING RESTRICTIONS: No  FALLS: Has patient fallen in last 6 months? No  LIVING ENVIRONMENT: via chart, pt confirms the following is still correct Lives with: lives with their family and lives with their spouse Lives in: House/apartment Stairs: No Has following equipment at home: Environmental consultant - 2 wheeled, Wheelchair (manual), Shower bench, bed side commode, and Grab bars  PLOF: Needs assistance with ADLs  PATIENT GOALS: Get out from bed to walker to bedside commode with or without LE braces donned, improve strength in UE/LE  OBJECTIVE:  Note: Objective measures were completed at Evaluation unless otherwise noted.  DIAGNOSTIC FINDINGS:   Via chart [facility] 05/13/2023 XR Femur 2 views right: Impression  Unchanged position and alignment with further osseous bridging at the distal femur fracture following intramedullary rod fixation. And findings FINDINGS:  Position and alignment of the comminuted distal femur fracture are unchanged after placement of a retrograde interlocking intramedullary rod. Proximal and distal interlocking components articulate appropriately with  the rod. There is progressive osseous bridging with remodeling at the fracture site, there is no new fracture or dislocation.   COGNITION: Overall cognitive status: Within functional limits for tasks assessed   SENSATION: Reports impaired BLE, does have sensation on tops of thighs per report   COORDINATION: WFL UE rapid alt movement WFL LE with heel to shin, but unable to perform rapid alt movement due to weakness in bilat DFs  EDEMA:  Reports no swelling    POSTURE: rounded shoulders and forward head  LOWER EXTREMITY ROM:     Slight impairment with R ankle DF > L with PROM Slight impairment of L knee extension and somewhat painful    LOWER EXTREMITY MMT:    Grossly 4/5 in BLE exception is pt unable to complete DF bilat, other areas of significant mm deficit include hip flexors, abductors and hamstrings bilat  TRANSFERS:  Attempted STS to RW from WC during eval. Unable to perform from wheelchair even with max assist. Pt reports she has been able to complete STS from higher surfaces and this is how she practiced in [facility]  Assistive device utilized: Walker - 2 wheeled  Sit to stand: unable from standard chair height/WC see above  FUNCTIONAL TESTS:  Attempted STS from [facility], unable at this time. Will attempt functional test like 30 sec STS in future as pt able  PATIENT SURVEYS:  LEFS 50/80 FOTO 54  TREATMENT DATE: 08/01/2024   TherAct:   Gait with RW, CGA, and WC follow: - bout 1: 75ft - bout 2: 150ft    STS with FWW for UE support in standing x8- pt utilizes arm rests of WC to assist with standing and control descent to sitting      Self-Care Home Management:  Education provided on current POC and the request for more visits, however insurance only having 18 total visits.  Pt, therapist, and co-worker discussed alternative options if  pt is no longer able to continue with therapy at this time due to having to pay out of pocket.     PATIENT EDUCATION: Education details: assessment findings, goals, plan, HEP. WC planning HEP expansion  Pt educated throughout session about proper posture and technique with exercises. Improved exercise technique, movement at target joints, use of target muscles after min to mod verbal, visual, tactile cues.  Person educated: Patient and Spouse Education method: Explanation, Verbal cues, and Handouts Education comprehension: verbalized understanding and returned demonstration  HOME EXERCISE PROGRAM: Access Code: [email or web address]  URL: [email or web address] Date: 07/05/2024  Prepared by: Marina  Moser  Exercises  - Standing Anterior Posterior Weight Shift with Chair  - 1 x daily - 7 x weekly - 2 sets - 10 reps - 5 hold  - Standing Weight Shift Side to Side  - 1 x daily - 7 x weekly - 2 sets - 10 reps - 5 hold  - Standing March with Counter Support  - 1 x daily - 7 x weekly - 2 sets - 10 reps - 5 hold  - Standing Toe Taps  - 1 x daily - 7 x weekly - 2 sets - 10 reps - 5 hold  - Squat with Chair and Counter Support  - 1 x daily - 7 x weekly - 2 sets - 10 reps - 5 hold   Access Code: [email or web address] URL: [email or web address] Date: 12/29/2023 Prepared by: Darryle Patten  Exercises - Seated Hamstring Curls with Resistance  - 1 x daily - 5-6 x weekly - 3-4 sets - 20-25 reps - Seated Hip Abduction with Resistance  - 1 x daily - 5-6 x weekly - 3-4 sets - 20-25 reps - Seated March  - 1 x daily - 5-6 x weekly - 3-4 sets - 20-25 reps   Access Code: [email or web address] URL: [email or web address] Date: 01/26/2024 Prepared by: Massie Dollar  Exercises - Supine Heel Slide  - 1 x daily - 7 x weekly - 3 sets - 10 reps - Supine Straight Leg Raises  - 1 x daily - 7 x weekly - 3 sets - 10 reps - Hooklying Clamshell with Resistance  - 1 x daily - 7 x weekly - 3 sets - 10 reps - Supine  Hip Flexion  - 1 x daily - 7 x weekly - 3 sets - 10 reps - Supine Single Leg Ankle Pumps  - 1 x daily - 7 x weekly - 3 sets - 10 reps - Supine Bridge  - 1 x daily - 7 x weekly - 3 sets - 10 reps GOALS: Goals reviewed with patient? Yes  SHORT TERM GOALS: Target date: 02/09/2024   Patient will be independent in home exercise program to improve strength/mobility for better functional independence with ADLs. Baseline: initiated Goal status:MET    LONG TERM GOALS: Target date:  09/04/2024     Patient will perform sit to stand with min-mod assist from standard chair height in order to indicate improved lower extremity functional  strength and improved ability to perform functional transfers and functional leg activities within her home.  Baseline: mod assist with +2 for safety.  4/2: mod assist 6/23: CGA from wheelchair  7/29: CGA from [facility] with RW  Goal status: MET   2.  Pt will improve LEFS by 10 points or more in order to indicate improved LE function and mobility.  Baseline: 50 4/2: 32 7/29: 31  Goal status: progressing   3.  Patient will increase FOTO score to equal to or greater than  59   to demonstrate statistically significant improvement in mobility and quality of life.  Baseline: 54 Goal status: goal d/c'ed.   4.  Pt will require no more than min assist for transfer from bed to bedside commode Baseline: supervision assist with  slide board transfer. To mat table  Using walker for transfer to [facility] with mod assist for safety and  6/23: husband helps supervise and set up with CGA 7/29: able to transfer to and from [facility] with RW without assist from Husband  Goal status: MET  5.  The pt will demo ability to don socks and shoes indep or mod I for increased ease with dressing and improved independence Baseline: required max assist from husband  4/2: pt states that she is donning night socks. Is still requiring max assist for LLE and moderate assist for the RLE  6/23; husband  requires max A for socks and shoes 7/29: reports that she attempted to put on socks and shoes, without success due to strength and ROm deficits in Bil hips; but was  Goal status: Progressing  6.  The pt will demo ability to complete 30 second STS test to indicate increased ease with transfers and BLE strength Baseline: 2 sit<>stand from [facility] with cushion in place and  +2 for safety.  4/2: 3 with mod assist and +2 to stabilize RW  6/23: 3 with CGA; +2 to stabilize RW without brace. With KAFOs on: 3.5 with CGA; +2 to stabilize  7/29: 4.5 with RW and CGA from arm chair with airex pad.  Goal status: progressing   7.  Patient will increase 10 meter walk test to >1.66m/s as to improve gait speed for better community ambulation and to reduce fall risk. Baseline: 5/23: 2 min 2 seconds with orthotics on; 1 min 7 seconds without orthotics with wheelchair follow and BrW.  7/29: Average: 46.79  sec 0.64m/s  with RW and WC follow , no Bracing Goal status: NEW   ASSESSMENT:  CLINICAL IMPRESSION:  Pt has made significant progress since last time author worked with pt.  Pt was able to ambulate >100' during session which she reports being the furthest she has ever been.  Pt and therapist discussed future sessions and alternative options if pt is no longer able to come to therapy due to potentially having to pay out of pocket for therapy services.  Pt to find out more information regarding insurance situation tomorrow and let therapist know so that joint effort to update current POC can occur.   Pt will continue to benefit from skilled therapy to address remaining deficits in order to improve overall QoL and return to PLOF.     OBJECTIVE IMPAIRMENTS: Abnormal gait, decreased activity tolerance, decreased balance, decreased coordination, decreased endurance, decreased mobility, difficulty walking, decreased ROM, decreased strength, hypomobility, impaired sensation, improper body mechanics, postural dysfunction,  and pain.   ACTIVITY LIMITATIONS: carrying, lifting, bending, standing, squatting, stairs, transfers, bed mobility, bathing, toileting, dressing, and locomotion  level  PARTICIPATION LIMITATIONS: meal prep, cleaning, laundry, driving, shopping, community activity, and yard work  PERSONAL FACTORS: Fitness, Sex, Time since onset of injury/illness/exacerbation, and 3+ comorbidities: PMH per chart significant for arthritis, blood clotting disorder, DDD (lumbar), DM, headache, HTN, neuropathy, vertigo are also affecting patient's functional outcome.   REHAB POTENTIAL: Good  CLINICAL DECISION MAKING: Evolving/moderate complexity  EVALUATION COMPLEXITY: Moderate  PLAN:  PT FREQUENCY: 1-2x/week  PT DURATION: 12 weeks  PLANNED INTERVENTIONS: [id number]- PT Re-evaluation, 97110-Therapeutic exercises, 97530- Therapeutic activity, [id number]- Neuromuscular re-education, 97535- Self Care, [id number]- Manual therapy, [id number]- Gait training, [phone]- Orthotic Fit/training, [phone]- Canalith repositioning, [id number]- Electrical stimulation (manual), [id number]- Ultrasound, Patient/Family education, Balance training, Stair training, Taping, Dry Needling, Joint mobilization, Spinal mobilization, Vestibular training, DME instructions, Wheelchair mobility training, Cryotherapy, and Moist heat  PLAN FOR NEXT SESSION:   Continue BLE strengthening.  Gait as tolerated  Ankle AROM/strengthening    Fonda Simpers, PT, DPT Physical Therapist - [facility]  08/01/24, 5:37 PM

## 2024-08-02 ENCOUNTER — Encounter: Payer: Self-pay | Admitting: Family Medicine

## 2024-08-02 NOTE — Telephone Encounter (Signed)
 Appt for 08/03/24 f/up r/t increased foot pain.

## 2024-08-03 ENCOUNTER — Ambulatory Visit
Attending: Student in an Organized Health Care Education/Training Program | Admitting: Student in an Organized Health Care Education/Training Program

## 2024-08-03 ENCOUNTER — Encounter: Payer: Self-pay | Admitting: Student in an Organized Health Care Education/Training Program

## 2024-08-03 VITALS — BP 115/72 | HR 84 | Temp 97.2°F | Ht 70.0 in | Wt 235.0 lb

## 2024-08-03 DIAGNOSIS — G8929 Other chronic pain: Secondary | ICD-10-CM | POA: Diagnosis not present

## 2024-08-03 DIAGNOSIS — M25511 Pain in right shoulder: Secondary | ICD-10-CM | POA: Insufficient documentation

## 2024-08-03 DIAGNOSIS — E1142 Type 2 diabetes mellitus with diabetic polyneuropathy: Secondary | ICD-10-CM | POA: Diagnosis not present

## 2024-08-03 DIAGNOSIS — G894 Chronic pain syndrome: Secondary | ICD-10-CM | POA: Insufficient documentation

## 2024-08-03 DIAGNOSIS — M25512 Pain in left shoulder: Secondary | ICD-10-CM | POA: Diagnosis not present

## 2024-08-03 DIAGNOSIS — Z79899 Other long term (current) drug therapy: Secondary | ICD-10-CM | POA: Diagnosis not present

## 2024-08-03 DIAGNOSIS — M4310 Spondylolisthesis, site unspecified: Secondary | ICD-10-CM | POA: Insufficient documentation

## 2024-08-03 DIAGNOSIS — M170 Bilateral primary osteoarthritis of knee: Secondary | ICD-10-CM | POA: Insufficient documentation

## 2024-08-03 DIAGNOSIS — Z794 Long term (current) use of insulin: Secondary | ICD-10-CM

## 2024-08-03 MED ORDER — JOURNAVX 50 MG PO TABS: 50.0000 mg | ORAL_TABLET | Freq: Two times a day (BID) | ORAL | 0 refills | Status: AC

## 2024-08-03 NOTE — Progress Notes (Signed)
 PROVIDER NOTE: Interpretation of information contained herein should be left to medically-trained personnel. Specific patient instructions are provided elsewhere under Patient Instructions section of medical record. This document was created in part using AI and STT-dictation technology, any transcriptional errors that may result from this process are unintentional.  Patient: Gabriella Marsh  Service: E/M   PCP: Donzella Lauraine SAILOR, DO  DOB: [birth year]  DOS: 08/03/2024  Provider: Wallie Sherry, MD  MRN: [id number]  Delivery: Face-to-face  Specialty: Interventional Pain Management  Type: Established Patient  Setting: Ambulatory outpatient facility  Specialty designation: 09  Referring Prov.: Donzella Lauraine SAILOR, DO  Location: Outpatient office facility       History of present illness (HPI) Gabriella Marsh, a 58 y.o. year old female, is here today because of her Type 2 diabetes mellitus with peripheral neuropathy (HCC) [E11.42]. [name] complain today is Peripheral Neuropathy  Pertinent problems: Gabriella Marsh has Diabetic peripheral neuropathy (HCC); RLS (restless legs syndrome); Chronic low back pain (2ry area of Pain) (Bilateral) (L>R) w/o sciatica; DDD (degenerative disc disease), lumbosacral; Disorder of peripheral nervous system; Chronic pain syndrome; Disorder of skeletal system; Problems influencing health status; Chronic hand pain (1ry area of Pain) (Bilateral) (L>R); Chronic feet pain (3ry area of Pain) (Bilateral) (L>R); Chronic shoulder pain (4th area of Pain) (Left); Chronic knee pain (5th area of Pain) (Bilateral) (L>R); Lumbar facet syndrome (Bilateral); Chronic sensorimotor polyneuropathy with axonal and demyelinating features; Osteoarthritis of knees (Bilateral); Tricompartment osteoarthritis of knees (Bilateral);  Grade 1 Retrolisthesis (9mm) of L5 over S1; and Chronic peripheral neuropathic pain on their pertinent problem list.  Pain Assessment: Severity of Chronic  pain is reported as a 6 /10. Location: Foot Right, Left/Back of bilateral calves to bilateral feet to all toes top and bottom of feet.. Onset: More than a month ago. Quality: Burning, Stabbing, Numbness, Tingling. Timing: Intermittent. Modifying factor(s): Medication. Vitals:  height is 5' 10 (1.778 m) and weight is 235 lb (106.6 kg). Her temporal temperature is 97.2 F (36.2 C) (abnormal). Her blood pressure is 115/72 and her pulse is 84.  BMI: Estimated body mass index is 33.72 kg/m as calculated from the following:   Height as of this encounter: 5' 10 (1.778 m).   Weight as of this encounter: 235 lb (106.6 kg).  Last encounter: 03/28/2024. Last procedure: Visit date not found.  Reason for encounter:   Discussed the use of AI scribe software for clinical note transcription with the patient, who gave verbal consent to proceed.  History of Present Illness   Gabriella Marsh is a 58 year old female with chronic foot pain who presents for pain management.  She experiences intense pain in her feet, similar to previous episodes. She takes gabapentin  and nortriptyline  at bedtime for restless legs. No side effects from nortriptyline  have been noted, and she has not tried increasing the dose.  She has been managing her diabetes and recently lowered her A1c to 7.6. She uses a continuous glucose monitor and has been working with a pharmacist to adjust her insulin  dosages. Her goal is to eventually reduce her insulin  use.  She will be transitioning to Medicare starting December and is optimistic of that Qutenza  will be covered as she did experience significant pain relief from it in the past      UDS:  Summary  Date Value Ref Range Status  06/21/2024 FINAL  Final    Comment:    ==================================================================== [employer] [facility] (MW) ==================================================================== Test  Result       Flag        Units  Drug Present and Declared for Prescription Verification   Oxycodone                       858          EXPECTED   ng/mg creat   Oxymorphone                    768          EXPECTED   ng/mg creat   Noroxycodone                   1293         EXPECTED   ng/mg creat   Noroxymorphone                 402          EXPECTED   ng/mg creat    Sources of oxycodone  are scheduled prescription medications.    Oxymorphone, noroxycodone, and noroxymorphone are expected    metabolites of oxycodone . Oxymorphone is also available as a    scheduled prescription medication.  ==================================================================== Test                      Result    Flag   Units      Ref Range   Creatinine              85               mg/dL      >=79 ==================================================================== Declared Medications:  The flagging and interpretation on this report are based on the  following declared medications.  Unexpected results may arise from  inaccuracies in the declared medications.   **Note: The testing scope of this panel includes these medications:   Oxycodone  (Percocet)   **Note: The testing scope of this panel does not include the  following reported medications:   Acetaminophen  (Tylenol )  Acetaminophen  (Percocet)  Atorvastatin  (Lipitor)  Cyclobenzaprine  (Flexeril )  Dulaglutide  (Trulicity )  Fluticasone  (Flonase )  Furosemide  (Lasix )  Gabapentin  (Neurontin )  Insulin  (NovoLog )  Levothyroxine  (Synthroid )  Lisinopril  (Zestril )  Melatonin  Metformin  (Glucophage )  Nortriptyline  (Pamelor )  Nystatin  (Mycolog-II )  Omeprazole  (Prilosec)  Trazodone  (Desyrel )  Triamcinolone  (Mycolog-II )  Vitamin D2 (Drisdol ) ==================================================================== For clinical consultation, please call [phone]. ====================================================================     No results found for: CBDTHCR No  results found for: D8THCCBX No results found for: D9THCCBX  ROS  Constitutional: Denies any fever or chills Gastrointestinal: No reported hemesis, hematochezia, vomiting, or acute GI distress Musculoskeletal: Bilateral hand and feet paresthesias Neurological: No reported episodes of acute onset apraxia, aphasia, dysarthria, agnosia, amnesia, paralysis, loss of coordination, or loss of consciousness  Medication Review  Dulaglutide , INSULIN  SYRINGE 1CC/31GX5/16, Insulin  Pen Needle, Melatonin, Suzetrigine , Vitamin D  (Ergocalciferol ), acetaminophen , atorvastatin , cyclobenzaprine , fluticasone , furosemide , gabapentin , glucose blood, insulin  aspart, insulin  glargine-yfgn, levothyroxine , lisinopril , metFORMIN , nortriptyline , nystatin -triamcinolone  ointment, omeprazole , oxyCODONE -acetaminophen , and traZODone   History Review  Allergy: Gabriella Marsh is allergic to celecoxib, pregabalin, buspar  [buspirone ], citalopram , and other. Drug: Gabriella Marsh  reports no history of drug use. Alcohol:  reports no history of alcohol use. Tobacco:  reports that she quit smoking about 12 years ago. Her smoking use included cigarettes. She has never used smokeless tobacco. Social: Gabriella Marsh  reports that she quit smoking about 12 years ago. Her smoking use included cigarettes. She has never used smokeless tobacco. She reports  that she does not drink alcohol and does not use drugs. Medical:  has a past medical history of Arthritis, Blood clotting disorder (HCC), Degenerative disc disease, lumbar, Diabetes mellitus without complication (HCC), GERD (gastroesophageal reflux disease), Headache, Hyperlipidemia, Hypertension, Neuropathy, and Vertigo. Surgical: Gabriella Marsh  has a past surgical history that includes Cesarean section; Shoulder surgery (Left, 12/26/014); Abdominal hysterectomy; Tonsillectomy and adenoidectomy; Uterine fibroid surgery; Ovarian cyst surgery; Cholecystectomy; left arm frature (10/12/2020);  Thyroidectomy (N/A, 03/28/2021); Parathyroidectomy (03/28/2021); Amputation toe (Right, 07/24/2021); Colonoscopy with propofol  (N/A, 02/20/2022); and right femur surgery (Right). Family: family history includes Congestive Heart Failure in her father; Diabetes in her father; Healthy in her brother; Heart disease in her father; Hyperlipidemia in her father; Hypertension in her father; Irritable bowel syndrome in her mother; Osteoporosis in her maternal grandmother and mother.  Laboratory Chemistry Profile   Renal Lab Results  Component Value Date   BUN 15 07/24/2024   CREATININE 0.66 07/24/2024   BCR 23 07/24/2024   GFR 111.18 06/05/2015   GFRAA 92 01/03/2021   GFRNONAA >60 04/04/2024    Hepatic Lab Results  Component Value Date   AST 22 07/24/2024   ALT 27 07/24/2024   ALBUMIN 4.2 07/24/2024   ALKPHOS 77 07/24/2024   LIPASE 29 04/04/2024    Electrolytes Lab Results  Component Value Date   NA 139 07/24/2024   K 5.1 07/24/2024   CL 97 07/24/2024   CALCIUM  8.9 07/24/2024   MG 1.8 01/09/2023    Bone Lab Results  Component Value Date   VD25OH 13.8 (L) 04/21/2024   25OHVITD1 14 (L) 11/12/2021   25OHVITD2 <1.0 11/12/2021   25OHVITD3 13 11/12/2021    Inflammation (CRP: Acute Phase) (ESR: Chronic Phase) Lab Results  Component Value Date   CRP 4 11/12/2021   ESRSEDRATE 39 11/12/2021   LATICACIDVEN 2.9 (HH) 01/07/2023         Note: Above Lab results reviewed.  Recent Imaging Review  CT ABDOMEN PELVIS W CONTRAST CLINICAL DATA:  Right lower quadrant abdominal pain.  EXAM: CT ABDOMEN AND PELVIS WITH CONTRAST  TECHNIQUE: Multidetector CT imaging of the abdomen and pelvis was performed using the standard protocol following bolus administration of intravenous contrast.  RADIATION DOSE REDUCTION: This exam was performed according to the departmental dose-optimization program which includes automated exposure control, adjustment of the mA and/or kV according to patient  size and/or use of iterative reconstruction technique.  CONTRAST:  OMNIPAQUE  IOHEXOL  300 MG/ML  SOLN  COMPARISON:  None Available.  FINDINGS: Lower chest: No acute abnormality.  Hepatobiliary: The liver is mildly enlarged. There is a hypodensity in the inferior right lobe of the liver which is too small to characterize measuring 7 mm image 2/37. Gallbladder surgically absent. There is no biliary ductal dilatation.  Pancreas: Unremarkable. No pancreatic ductal dilatation or surrounding inflammatory changes.  Spleen: Normal in size without focal abnormality.  Adrenals/Urinary Tract: Bladder is distended. The bladder otherwise appears within normal limits. The bilateral kidneys and adrenal glands are within normal limits.  Stomach/Bowel: Stomach is within normal limits. Appendix appears normal. No evidence of bowel wall thickening, distention, or inflammatory changes.  Vascular/Lymphatic: Aortic atherosclerosis. No enlarged abdominal or pelvic lymph nodes.  Reproductive: Uterus and bilateral adnexa are unremarkable.  Other: There is some subcutaneous stranding in the lower anterior abdominal wall. No fluid collection. No significant abdominal wall hernia.  Musculoskeletal: There is mild chronic appearing compression deformity of L1. There are degenerative changes at L5-S1.  IMPRESSION: 1. No acute localizing  process in the abdomen or pelvis. 2. Distended bladder. 3. Mild hepatomegaly. 4. Subcutaneous stranding in the lower anterior abdominal wall, nonspecific. 5. Aortic atherosclerosis.  Electronically Signed   By: Greig Pique M.D.   On: 04/04/2024 18:13 US  Venous Img Lower Unilateral Right CLINICAL DATA:  Right lower extremity pain and edema  EXAM: RIGHT LOWER EXTREMITY VENOUS DOPPLER ULTRASOUND  TECHNIQUE: Gray-scale sonography with graded compression, as well as color Doppler and duplex ultrasound were performed to evaluate the lower extremity deep  venous systems from the level of the common femoral vein and including the common femoral, femoral, profunda femoral, popliteal and calf veins including the posterior tibial, peroneal and gastrocnemius veins when visible. Spectral Doppler was utilized to evaluate flow at rest and with distal augmentation maneuvers in the common femoral, femoral and popliteal veins.  COMPARISON:  None Available.  FINDINGS: Contralateral Common Femoral Vein: Respiratory phasicity is normal and symmetric with the symptomatic side. No evidence of thrombus. Normal compressibility.  Common Femoral Vein: No evidence of thrombus. Normal compressibility, respiratory phasicity and response to augmentation.  Saphenofemoral Junction: No evidence of thrombus. Normal compressibility and flow on color Doppler imaging.  Profunda Femoral Vein: No evidence of thrombus. Normal compressibility and flow on color Doppler imaging.  Femoral Vein: No evidence of thrombus. Normal compressibility, respiratory phasicity and response to augmentation.  Popliteal Vein: No evidence of thrombus. Normal compressibility, respiratory phasicity and response to augmentation.  Calf Veins: No evidence of thrombus. Normal compressibility and flow on color Doppler imaging.  IMPRESSION: No evidence of deep venous thrombosis.  Electronically Signed   By: CHRISTELLA.  Shick M.D.   On: 04/04/2024 14:51 Note: Reviewed        Physical Exam  Vitals: BP 115/72 (BP Location: Right Arm, Patient Position: Sitting)   Pulse 84   Temp (!) 97.2 F (36.2 C) (Temporal)   Ht 5' 10 (1.778 m)   Wt 235 lb (106.6 kg)   BMI 33.72 kg/m  BMI: Estimated body mass index is 33.72 kg/m as calculated from the following:   Height as of this encounter: 5' 10 (1.778 m).   Weight as of this encounter: 235 lb (106.6 kg). Ideal: Ideal body weight: 68.5 kg (151 lb 0.2 oz) Adjusted ideal body weight: 83.7 kg (184 lb 9.7 oz) General appearance: Well nourished, well  developed, and well hydrated. In no apparent acute distress Mental status: Alert, oriented x 3 (person, place, & time)       Respiratory: No evidence of acute respiratory distress Eyes: PERLA  Paresthesias of bilateral hand and feet  Assessment   Diagnosis  1. Type 2 diabetes mellitus with peripheral neuropathy (HCC)   2. Pharmacologic therapy   3. Chronic shoulder pain (4th area of Pain) (Left)   4.  Grade 1 Retrolisthesis (9mm) of L5 over S1   5. Osteoarthritis of knees (Bilateral)   6. Chronic pain syndrome   7. Pain in joint of right shoulder        Plan of Care    Ms. Javonda Suh has a current medication list which includes the following long-term medication(s): atorvastatin , fluticasone , furosemide , gabapentin , insulin  aspart, insulin  pen needle, levothyroxine , lisinopril , metformin , nortriptyline , omeprazole , oxycodone -acetaminophen , oxycodone -acetaminophen , [START ON 08/25/2024] oxycodone -acetaminophen , and trazodone .  Pharmacotherapy (Medications Ordered): Meds ordered this encounter  Medications   Suzetrigine  (JOURNAVX ) 50 MG TABS    Sig: Take 50 mg by mouth every 12 (twelve) hours for 14 days.    Dispense:  30 tablet    Refill:  0   Explore Qutenza  in December after patient has transition to Medicare Keep follow-up for medication management with [facility]   Return for patient will call to schedule F2F appt prn.    Recent Visits Date Type Provider Dept  06/21/24 Office Visit Patel, Seema K, NP [facility]  Showing recent visits within past 90 days and meeting all other requirements Today's Visits Date Type Provider Dept  08/03/24 Office Visit Marcelino Nurse, MD [facility]  Showing today's visits and meeting all other requirements Future Appointments Date Type Provider Dept  09/18/24 Appointment Patel, Seema K, NP [facility]  Showing future appointments within next 90 days and meeting all other requirements  I discussed  the assessment and treatment plan with the patient. The patient was provided an opportunity to ask questions and all were answered. The patient agreed with the plan and demonstrated an understanding of the instructions.  Patient advised to call back or seek an in-person evaluation if the symptoms or condition worsens.  Duration of encounter: .  Total time on encounter, as per AMA guidelines included both the face-to-face and non-face-to-face time personally spent by the physician and/or other qualified health care professional(s) on the day of the encounter (includes time in activities that require the physician or other qualified health care professional and does not include time in activities normally performed by clinical staff). Physician's time may include the following activities when performed: Preparing to see the patient (e.g., pre-charting review of records, searching for previously ordered imaging, lab work, and nerve conduction tests) Review of prior analgesic pharmacotherapies. Reviewing PMP Interpreting ordered tests (e.g., lab work, imaging, nerve conduction tests) Performing post-procedure evaluations, including interpretation of diagnostic procedures Obtaining and/or reviewing separately obtained history Performing a medically appropriate examination and/or evaluation Counseling and educating the patient/family/caregiver Ordering medications, tests, or procedures Referring and communicating with other health care professionals (when not separately reported) Documenting clinical information in the electronic or other health record Independently interpreting results (not separately reported) and communicating results to the patient/ family/caregiver Care coordination (not separately reported)  Note by: Nurse Marcelino, MD (TTS and AI technology used. I apologize for any typographical errors that were not detected and corrected.) Date: 08/03/2024; Time: 4:30 PM

## 2024-08-03 NOTE — Progress Notes (Signed)
 Safety precautions to be maintained throughout the outpatient stay will include: orient to surroundings, keep bed in low position, maintain call bell within reach at all times, provide assistance with transfer out of bed and ambulation.

## 2024-08-03 NOTE — Patient Instructions (Addendum)
 Discuss with PCP increasing Nortriptyline  to 75 mg Coupon card for Journavax/ Given LP

## 2024-08-08 ENCOUNTER — Ambulatory Visit

## 2024-08-08 DIAGNOSIS — R2689 Other abnormalities of gait and mobility: Secondary | ICD-10-CM

## 2024-08-08 DIAGNOSIS — R262 Difficulty in walking, not elsewhere classified: Secondary | ICD-10-CM | POA: Diagnosis not present

## 2024-08-08 DIAGNOSIS — M5459 Other low back pain: Secondary | ICD-10-CM

## 2024-08-08 DIAGNOSIS — R2681 Unsteadiness on feet: Secondary | ICD-10-CM

## 2024-08-08 DIAGNOSIS — M6281 Muscle weakness (generalized): Secondary | ICD-10-CM | POA: Diagnosis not present

## 2024-08-08 NOTE — Therapy (Signed)
 OUTPATIENT PHYSICAL THERAPY NEURO TREATMENT    Patient Name: Gabriella Marsh MRN: [id number] DOB:December 15, 1966, 58 y.o., female Today's Date: 08/08/2024   PCP: Donzella Lauraine SAILOR, DO REFERRING PROVIDER: Laurence Prentice DASEN, MD  END OF SESSION:  PT End of Session - 08/08/24 1619     Visit Number 22    Number of Visits 25    Date for PT Re-Evaluation 09/04/24    Progress Note Due on Visit 20    PT Start Time 1619    PT Stop Time 1700    PT Time Calculation (min) 41 min    Equipment Utilized During Treatment Gait belt    Activity Tolerance Patient tolerated treatment well    Behavior During Therapy WFL for tasks assessed/performed         Past Medical History:  Diagnosis Date   Arthritis    knees   Blood clotting disorder (HCC)    on toe    Degenerative disc disease, lumbar    Diabetes mellitus without complication (HCC)    GERD (gastroesophageal reflux disease)    Headache    daily - AM (has not been able to have SPG blocks lately)   Hyperlipidemia    Hypertension    Neuropathy    feet   Vertigo    3-4x/yr   Past Surgical History:  Procedure Laterality Date   ABDOMINAL HYSTERECTOMY     But still has cervix   AMPUTATION TOE Right 07/24/2021   Procedure: AMPUTATION TOE;  Surgeon: Neill Boas, DPM;  Location: [facility] ORS;  Service: Podiatry;  Laterality: Right;   CESAREAN SECTION     CHOLECYSTECTOMY     COLONOSCOPY WITH PROPOFOL  N/A 02/20/2022   Procedure: COLONOSCOPY WITH PROPOFOL ;  Surgeon: Therisa Bi, MD;  Location: [facility] ENDOSCOPY;  Service: Gastroenterology;  Laterality: N/A;   left arm frature  10/12/2020   OVARIAN CYST SURGERY     PARATHYROIDECTOMY  03/28/2021   Procedure: PARATHYROIDECTOMY AUTOTRANSPLANT;  Surgeon: Marolyn Nest, MD;  Location: [facility] ORS;  Service: General;;   right femur surgery Right    SHOULDER SURGERY Left 12/26/014   Dr. CECILE Glenn, [facility]   THYROIDECTOMY N/A 03/28/2021   Procedure: THYROIDECTOMY, total;  Surgeon: Marolyn Nest,  MD;  Location: [facility] ORS;  Service: General;  Laterality: N/A;  Provider requesting 3 hours /180 minutes for procedure   TONSILLECTOMY AND ADENOIDECTOMY     UTERINE FIBROID SURGERY     Patient Active Problem List   Diagnosis Date Noted   Neuropathy 04/21/2024   Lymphedema 04/21/2024   Wheelchair dependence 03/10/2023   Hypertension associated with diabetes (HCC) 03/10/2023   AKI (acute kidney injury) (HCC) 01/07/2023   Neurogenic bladder 01/07/2023   FTT (failure to thrive) in adult 12/09/2022   Primary insomnia 09/10/2022   Impaired functional mobility, balance, gait, and endurance 06/09/2022   Type 2 diabetes mellitus with diabetic neuropathy, with long-term current use of insulin  (HCC) 04/24/2022   Hypothyroidism 04/24/2022   Hyperlipidemia associated with type 2 diabetes mellitus (HCC) 04/24/2022   Iron deficiency anemia 04/24/2022   Chronic sensorimotor polyneuropathy with axonal and demyelinating features 01/12/2022   Osteoarthritis of knees (Bilateral) 01/12/2022   Tricompartment osteoarthritis of knees (Bilateral) 01/12/2022    Grade 1 Retrolisthesis (9mm) of L5 over S1 01/12/2022   History of proximal humerus fracture (Left) 01/12/2022   Vitamin D  deficiency 01/12/2022   Chronic peripheral neuropathic pain 01/12/2022   Disorder of skeletal system 11/12/2021   Problems influencing health status 11/12/2021   Chronic hand pain (  1ry area of Pain) (Bilateral) (L>R) 11/12/2021   Chronic feet pain (3ry area of Pain) (Bilateral) (L>R) 11/12/2021   Chronic shoulder pain (4th area of Pain) (Left) 11/12/2021   Chronic knee pain (5th area of Pain) (Bilateral) (L>R) 11/12/2021   Lumbar facet syndrome (Bilateral) 11/12/2021   COVID-19 virus infection 07/22/2021   Chronic pain syndrome 07/17/2021   S/P total thyroidectomy 03/28/2021   Thyroid  nodule    Schamberg's purpura 10/09/2019   Common migraine with intractable migraine 06/18/2015   DDD (degenerative disc disease), lumbosacral  05/03/2015   Insomnia due to medical condition 05/03/2015   Disorder of peripheral nervous system 05/03/2015   Diabetic peripheral neuropathy (HCC) 03/06/2015   Obesity 03/06/2015   GERD (gastroesophageal reflux disease) 03/06/2015   Multinodular goiter 03/06/2015   RLS (restless legs syndrome) 03/06/2015   Chronic low back pain (2ry area of Pain) (Bilateral) (L>R) w/o sciatica 03/06/2015    ONSET DATE: 06/18/22  REFERRING DIAG: S72.91XD (ICD-10-CM) - Unspecified fracture of right femur, subsequent encounter for closed fracture with routine healing   THERAPY DIAG:  Difficulty in walking, not elsewhere classified  Muscle weakness (generalized)  Other abnormalities of gait and mobility  Unsteadiness on feet  Other low back pain  Rationale for Evaluation and Treatment: Rehabilitation  SUBJECTIVE:                                                                                                                                                                                             SUBJECTIVE STATEMENT:  Pt reports she is doing well and has been attempting more exercises at home.  Pt notes that she went to get checked out for the brace, but the legs were too swollen to adjust the brace or even get the brace on.  Pt accompanied by: Spouse   PERTINENT HISTORY: Pt is a pleasant 58 y/o female known to PT clinic, returning for impairments due to R femur fx sustained in 2023. Pt hx includes fall in June 2023 where pt suffered R femur fx followed by surgery where pt had plate and screws placed in R knee and rod in R femur. At the time pt with hx of multiple falls, now pt reports no falls in last 6 months. Since previous d/c from PT on 05/13/2023, pt has been consistently going to [facility] clinic 1x/week, and now returns to [facility] for new year. Pt reports she ambulated with RW while at [facility] clinic, has been able to perform STS to RW from higher surfaces. She would like to continue working on her  mobility and strength, including both UE and LE strength. She still struggles  with putting on her shoes and socks and getting from her bed to the commode. She reports standing at [facility] also limited due to chronic low back pain. She reports she'll likely get injections to tx LBP in future. Pt still performing previous HEP given, typically she completes 1 set of each exercise for 25-30 reps. Says this is easy. PMH per chart significant for arthritis, blood clotting disorder, DDD (lumbar), DM, headache, HTN, neuropathy, vertigo   PAIN:  Are you having pain? Yes: NPRS scale: not numerically rated Pain location: low back, bilat hand pain hurts a lot all the time  Pain description:   Aggravating factors: standing limited due to LBP in position Relieving factors: wearing gloves helps with hand pain  PRECAUTIONS: Fall  RED FLAGS: None   WEIGHT BEARING RESTRICTIONS: No  FALLS: Has patient fallen in last 6 months? No  LIVING ENVIRONMENT: via chart, pt confirms the following is still correct Lives with: lives with their family and lives with their spouse Lives in: House/apartment Stairs: No Has following equipment at home: Environmental consultant - 2 wheeled, Wheelchair (manual), Shower bench, bed side commode, and Grab bars  PLOF: Needs assistance with ADLs  PATIENT GOALS: Get out from bed to walker to bedside commode with or without LE braces donned, improve strength in UE/LE  OBJECTIVE:  Note: Objective measures were completed at Evaluation unless otherwise noted.  DIAGNOSTIC FINDINGS:   Via chart [facility] care 05/13/2023 XR Femur 2 views right: Impression  Unchanged position and alignment with further osseous bridging at the distal femur fracture following intramedullary rod fixation. And findings FINDINGS:  Position and alignment of the comminuted distal femur fracture are unchanged after placement of a retrograde interlocking intramedullary rod. Proximal and distal interlocking components  articulate appropriately with the rod. There is progressive osseous bridging with remodeling at the fracture site, there is no new fracture or dislocation.   COGNITION: Overall cognitive status: Within functional limits for tasks assessed   SENSATION: Reports impaired BLE, does have sensation on tops of thighs per report   COORDINATION: WFL UE rapid alt movement WFL LE with heel to shin, but unable to perform rapid alt movement due to weakness in bilat DFs  EDEMA:  Reports no swelling    POSTURE: rounded shoulders and forward head  LOWER EXTREMITY ROM:     Slight impairment with R ankle DF > L with PROM Slight impairment of L knee extension and somewhat painful    LOWER EXTREMITY MMT:    Grossly 4/5 in BLE exception is pt unable to complete DF bilat, other areas of significant mm deficit include hip flexors, abductors and hamstrings bilat  TRANSFERS:  Attempted STS to RW from WC during eval. Unable to perform from wheelchair even with max assist. Pt reports she has been able to complete STS from higher surfaces and this is how she practiced in [facility]  Assistive device utilized: Walker - 2 wheeled  Sit to stand: unable from standard chair height/WC see above  FUNCTIONAL TESTS:  Attempted STS from [facility], unable at this time. Will attempt functional test like 30 sec STS in future as pt able  PATIENT SURVEYS:  LEFS 50/80 FOTO 54  TREATMENT DATE: 08/08/2024   TherAct:   Gait with RW, CGA, and WC follow: - bout 1: 14' - bout 2: 56' - bout 3: 41'   STS with FWW for UE support in standing x8 - pt utilizes arm rests of WC to assist with standing and control descent to sitting   In // bars with CGA: Step up onto 4 step, R LE leading (strong LE) when stepping up and L LE leading when stepping down, x4 Step up onto 6 step, R LE leading (strong  LE) when stepping up and L LE leading when stepping down, x4     Self-Care Home Management:  Education provided about current prognosis and the goals for pt over the next few approved visits.  Pt and husband understanding of the current insurance situation and assisted in making the plan for the next few visits and the areas she would like to work on.  Pt ultimately wanting to perform steps, which is why they were attempted today.      PATIENT EDUCATION: Education details: assessment findings, goals, plan, HEP. WC planning HEP expansion  Pt educated throughout session about proper posture and technique with exercises. Improved exercise technique, movement at target joints, use of target muscles after min to mod verbal, visual, tactile cues.  Person educated: Patient and Spouse Education method: Explanation, Verbal cues, and Handouts Education comprehension: verbalized understanding and returned demonstration  HOME EXERCISE PROGRAM: Access Code: [email or web address]  URL: [email or web address] Date: 07/05/2024  Prepared by: Marina  Moser  Exercises  - Standing Anterior Posterior Weight Shift with Chair  - 1 x daily - 7 x weekly - 2 sets - 10 reps - 5 hold  - Standing Weight Shift Side to Side  - 1 x daily - 7 x weekly - 2 sets - 10 reps - 5 hold  - Standing March with Counter Support  - 1 x daily - 7 x weekly - 2 sets - 10 reps - 5 hold  - Standing Toe Taps  - 1 x daily - 7 x weekly - 2 sets - 10 reps - 5 hold  - Squat with Chair and Counter Support  - 1 x daily - 7 x weekly - 2 sets - 10 reps - 5 hold   Access Code: [email or web address] URL: [email or web address] Date: 12/29/2023 Prepared by: Darryle Patten  Exercises - Seated Hamstring Curls with Resistance  - 1 x daily - 5-6 x weekly - 3-4 sets - 20-25 reps - Seated Hip Abduction with Resistance  - 1 x daily - 5-6 x weekly - 3-4 sets - 20-25 reps - Seated March  - 1 x daily - 5-6 x weekly - 3-4 sets - 20-25 reps   Access  Code: [email or web address] URL: [email or web address] Date: 01/26/2024 Prepared by: Massie Dollar  Exercises - Supine Heel Slide  - 1 x daily - 7 x weekly - 3 sets - 10 reps - Supine Straight Leg Raises  - 1 x daily - 7 x weekly - 3 sets - 10 reps - Hooklying Clamshell with Resistance  - 1 x daily - 7 x weekly - 3 sets - 10 reps - Supine Hip Flexion  - 1 x daily - 7 x weekly - 3 sets - 10 reps - Supine Single Leg Ankle Pumps  - 1 x daily - 7 x weekly - 3 sets - 10 reps - Supine Bridge  - 1 x daily - 7 x weekly - 3 sets - 10 reps GOALS: Goals reviewed with patient?  Yes  SHORT TERM GOALS: Target date: 02/09/2024   Patient will be independent in home exercise program to improve strength/mobility for better functional independence with ADLs. Baseline: initiated Goal status:MET    LONG TERM GOALS: Target date:  09/04/2024     Patient will perform sit to stand with min-mod assist from standard chair height in order to indicate improved lower extremity functional strength and improved ability to perform functional transfers and functional leg activities within her home.  Baseline: mod assist with +2 for safety.  4/2: mod assist 6/23: CGA from wheelchair  7/29: CGA from [facility] with RW  Goal status: MET   2.  Pt will improve LEFS by 10 points or more in order to indicate improved LE function and mobility.  Baseline: 50 4/2: 32 7/29: 31  Goal status: progressing   3.  Patient will increase FOTO score to equal to or greater than  59   to demonstrate statistically significant improvement in mobility and quality of life.  Baseline: 54 Goal status: goal d/c'ed.   4.  Pt will require no more than min assist for transfer from bed to bedside commode Baseline: supervision assist with  slide board transfer. To mat table  Using walker for transfer to [facility] with mod assist for safety and  6/23: husband helps supervise and set up with CGA 7/29: able to transfer to and from [facility] with RW without  assist from Husband  Goal status: MET  5.  The pt will demo ability to don socks and shoes indep or mod I for increased ease with dressing and improved independence Baseline: required max assist from husband  4/2: pt states that she is donning night socks. Is still requiring max assist for LLE and moderate assist for the RLE  6/23; husband requires max A for socks and shoes 7/29: reports that she attempted to put on socks and shoes, without success due to strength and ROm deficits in Bil hips; but was  Goal status: Progressing  6.  The pt will demo ability to complete 30 second STS test to indicate increased ease with transfers and BLE strength Baseline: 2 sit<>stand from [facility] with cushion in place and  +2 for safety.  4/2: 3 with mod assist and +2 to stabilize RW  6/23: 3 with CGA; +2 to stabilize RW without brace. With KAFOs on: 3.5 with CGA; +2 to stabilize  7/29: 4.5 with RW and CGA from arm chair with airex pad.  Goal status: progressing   7.  Patient will increase 10 meter walk test to >1.97m/s as to improve gait speed for better community ambulation and to reduce fall risk. Baseline: 5/23: 2 min 2 seconds with orthotics on; 1 min 7 seconds without orthotics with wheelchair follow and BrW.  7/29: Average: 46.79  sec 0.3m/s  with RW and WC follow , no Bracing Goal status: NEW   ASSESSMENT:  CLINICAL IMPRESSION:  Pt performed well and was able to advance to performing stair training in the // bars.  Pt ultimately still needs assistance and CGA due to instability in the LE's, however pt was able to perform.  Pt would benefit from continued practice in order to improve the technique and the muscular strength necessary for the task.   Pt will continue to benefit from skilled therapy to address remaining deficits in order to improve overall QoL and return to PLOF.      OBJECTIVE IMPAIRMENTS: Abnormal gait, decreased activity tolerance, decreased balance, decreased coordination, decreased  endurance,  decreased mobility, difficulty walking, decreased ROM, decreased strength, hypomobility, impaired sensation, improper body mechanics, postural dysfunction, and pain.   ACTIVITY LIMITATIONS: carrying, lifting, bending, standing, squatting, stairs, transfers, bed mobility, bathing, toileting, dressing, and locomotion level  PARTICIPATION LIMITATIONS: meal prep, cleaning, laundry, driving, shopping, community activity, and yard work  PERSONAL FACTORS: Fitness, Sex, Time since onset of injury/illness/exacerbation, and 3+ comorbidities: PMH per chart significant for arthritis, blood clotting disorder, DDD (lumbar), DM, headache, HTN, neuropathy, vertigo are also affecting patient's functional outcome.   REHAB POTENTIAL: Good  CLINICAL DECISION MAKING: Evolving/moderate complexity  EVALUATION COMPLEXITY: Moderate  PLAN:  PT FREQUENCY: 1-2x/week  PT DURATION: 12 weeks  PLANNED INTERVENTIONS: [id number]- PT Re-evaluation, 97110-Therapeutic exercises, 97530- Therapeutic activity, [id number]- Neuromuscular re-education, 97535- Self Care, [id number]- Manual therapy, [id number]- Gait training, [phone]- Orthotic Fit/training, [phone]- Canalith repositioning, [id number]- Electrical stimulation (manual), [id number]- Ultrasound, Patient/Family education, Balance training, Stair training, Taping, Dry Needling, Joint mobilization, Spinal mobilization, Vestibular training, DME instructions, Wheelchair mobility training, Cryotherapy, and Moist heat  PLAN FOR NEXT SESSION:   Continue BLE strengthening.  Gait as tolerated  Ankle AROM/strengthening    Fonda Simpers, PT, DPT Physical Therapist - [facility]  08/08/24, 5:52 PM

## 2024-08-14 ENCOUNTER — Other Ambulatory Visit: Payer: Self-pay | Admitting: Pharmacist

## 2024-08-14 ENCOUNTER — Other Ambulatory Visit: Payer: Self-pay | Admitting: Family Medicine

## 2024-08-14 DIAGNOSIS — E1140 Type 2 diabetes mellitus with diabetic neuropathy, unspecified: Secondary | ICD-10-CM

## 2024-08-14 DIAGNOSIS — I890 Lymphedema, not elsewhere classified: Secondary | ICD-10-CM

## 2024-08-14 MED ORDER — MEDICAL COMPRESSION STOCKINGS MISC: 2.0000 | Freq: Every day | 0 refills | Status: AC

## 2024-08-14 NOTE — Progress Notes (Signed)
 08/14/2024 Name: Gabriella Marsh MRN: [id number] DOB: Mar 07, 1966  Chief Complaint  Patient presents with   Diabetes    Gabriella Marsh is a 58 y.o. year old female who presented for a telephone visit.   They were referred to the pharmacist by their PCP for assistance in managing diabetes.   Referral for DM control  ER 3 weeks ago for leg swelling and high BG's- found out Dexcom was not working  Subjective:  Care Team: Primary Care Provider: Donzella Lauraine SAILOR, DO ; Next Scheduled Visit: 10/24/24 Clinical Pharmacist: Aloysius Lewis, PharmD  Medication Access/Adherence  Current Pharmacy:  [facility] [phone] - [place], [place] - [address] AT [facility] OF [address] [place] [address] [place] [address] Phone: [phone] Fax: [phone]  [facility] - [place], [place] - [address] [place] [address] Phone: [phone] Fax: [phone]   Patient reports affordability concerns with their medications: No  Patient reports access/transportation concerns to their pharmacy: No  Patient reports adherence concerns with their medications:  No     Diabetes:  Current medications: Trulicity  4.5mg  weekly, Semglee  66U at bedtime, Novolog  20U with meals, Metformin  1000mg  twice a day (max dose) Medications tried in the past: Glipizide  (stopped insulin ), Trulicity  1.5mg  weekly (2023 due to shortage)   Using Accu-chek meter; testing 0 times daily (needs to find) *Currently uses Dexcom G7  8/25 visit:   8/11 visit:    07/10/24 visit:  06/26/24 visit:   06/01/24 visit:   05/29/24 visit:    05/24/24 visit:   05/19/24 visit:    5/27 visit:   5/13 visit:    Patient denies hypoglycemic s/sx including dizziness, shakiness, sweating. Patient denies hyperglycemic symptoms including polyuria, polydipsia, polyphagia, nocturia, neuropathy, blurred vision.  Current meal patterns:  - Breakfast  (5:30AM): Bowl of cheerios + cup of coffee  - Lunch (12-1PM): Mrs. Joselyn frozen foods prior, leftover now - Supper (5-6:30PM): Hot dogs, crockpot potato soup, eat out a good bit; goal of baked chicken + Mrs. Dash seasoning, rice or mac'n'cheese weekly - Snacks: Half an orange or banana - Drinks: Water, Splenda tea (half large tumbler daily) *Husband doesn't know how to cook- mostly microwavable items  Current physical activity: Can't walk- uses wheelchair  Current medication access support: [employer]   Objective:  Lab Results  Component Value Date   HGBA1C 8.2 (H) 07/24/2024    Lab Results  Component Value Date   CREATININE 0.66 07/24/2024   BUN 15 07/24/2024   NA 139 07/24/2024   K 5.1 07/24/2024   CL 97 07/24/2024   CO2 25 07/24/2024    Lab Results  Component Value Date   CHOL 135 07/24/2024   HDL 35 (L) 07/24/2024   LDLCALC 59 07/24/2024   TRIG 255 (H) 07/24/2024   CHOLHDL 3.9 07/24/2024    Medications Reviewed Today   Medications were not reviewed in this encounter       Assessment/Plan:   Diabetes: - Currently uncontrolled - Reviewed long term cardiovascular and renal outcomes of uncontrolled blood sugar - Reviewed goal A1c, goal fasting, and goal 2 hour post prandial glucose - Patient denies personal or family history of multiple endocrine neoplasia type 2, medullary thyroid  cancer; personal history of pancreatitis or gallbladder disease. - Recommend to check glucose continuously with Dexcom G7   Follow Up Plan:  - Follow-up on 09/05/24 for insulin  titration + Dexcom review -  CONTINUE Trulicity  to 4.5mg  weekly; NO changes today  - Had diarrhea with any Ozempic  doses higher than 0.25mg  weekly  - Reports appetite suppressed with 3mg  dose which is helpful - Continue Semglee  66U nightly  - Said they are having manufacturer issues with Semglee  pens currently, so continuing with vials for now - Continue Novolog  at 20U three times a day  - A1c  looked great over past 2 weeks- higher this weekend due to eating unhealthy meals while out celebrating husband's birthday; diet changes should help bring down BG's down further - Itching was not related to Trulicity - moreover sweat drying after being out in the sun - Current concern of leg swelling- only related medication is gabapentin  (no recent dose increase); only taking 1 furosemide  per day- will discuss with Dr. Donzella further - Asking about placing order for new compression stockings as well    Aloysius Lewis, PharmD [place]  Phone Number: [phone]

## 2024-08-15 ENCOUNTER — Ambulatory Visit

## 2024-08-15 DIAGNOSIS — R2681 Unsteadiness on feet: Secondary | ICD-10-CM

## 2024-08-15 DIAGNOSIS — M6281 Muscle weakness (generalized): Secondary | ICD-10-CM | POA: Diagnosis not present

## 2024-08-15 DIAGNOSIS — R262 Difficulty in walking, not elsewhere classified: Secondary | ICD-10-CM | POA: Diagnosis not present

## 2024-08-15 DIAGNOSIS — R2689 Other abnormalities of gait and mobility: Secondary | ICD-10-CM | POA: Diagnosis not present

## 2024-08-15 DIAGNOSIS — M5459 Other low back pain: Secondary | ICD-10-CM

## 2024-08-15 NOTE — Therapy (Signed)
 OUTPATIENT PHYSICAL THERAPY NEURO TREATMENT    Patient Name: Gabriella Marsh MRN: [id number] DOB:07-08-1966, 58 y.o., female Today's Date: 08/15/2024   PCP: Donzella Lauraine SAILOR, DO REFERRING PROVIDER: Laurence Prentice DASEN, MD  END OF SESSION:  PT End of Session - 08/15/24 1619     Visit Number 23    Number of Visits 25    Date for PT Re-Evaluation 09/04/24    Progress Note Due on Visit 20    PT Start Time 1619    PT Stop Time 1700    PT Time Calculation (min) 41 min    Equipment Utilized During Treatment Gait belt    Activity Tolerance Patient tolerated treatment well    Behavior During Therapy WFL for tasks assessed/performed          Past Medical History:  Diagnosis Date   Arthritis    knees   Blood clotting disorder (HCC)    on toe    Degenerative disc disease, lumbar    Diabetes mellitus without complication (HCC)    GERD (gastroesophageal reflux disease)    Headache    daily - AM (has not been able to have SPG blocks lately)   Hyperlipidemia    Hypertension    Neuropathy    feet   Vertigo    3-4x/yr   Past Surgical History:  Procedure Laterality Date   ABDOMINAL HYSTERECTOMY     But still has cervix   AMPUTATION TOE Right 07/24/2021   Procedure: AMPUTATION TOE;  Surgeon: Neill Boas, DPM;  Location: [facility] ORS;  Service: Podiatry;  Laterality: Right;   CESAREAN SECTION     CHOLECYSTECTOMY     COLONOSCOPY WITH PROPOFOL  N/A 02/20/2022   Procedure: COLONOSCOPY WITH PROPOFOL ;  Surgeon: Therisa Bi, MD;  Location: [facility] ENDOSCOPY;  Service: Gastroenterology;  Laterality: N/A;   left arm frature  10/12/2020   OVARIAN CYST SURGERY     PARATHYROIDECTOMY  03/28/2021   Procedure: PARATHYROIDECTOMY AUTOTRANSPLANT;  Surgeon: Marolyn Nest, MD;  Location: [facility] ORS;  Service: General;;   right femur surgery Right    SHOULDER SURGERY Left 12/26/014   Dr. CECILE Glenn, [facility]   THYROIDECTOMY N/A 03/28/2021   Procedure: THYROIDECTOMY, total;  Surgeon: Marolyn Nest, MD;  Location: [facility] ORS;  Service: General;  Laterality: N/A;  Provider requesting 3 hours /180 minutes for procedure   TONSILLECTOMY AND ADENOIDECTOMY     UTERINE FIBROID SURGERY     Patient Active Problem List   Diagnosis Date Noted   Neuropathy 04/21/2024   Lymphedema 04/21/2024   Wheelchair dependence 03/10/2023   Hypertension associated with diabetes (HCC) 03/10/2023   AKI (acute kidney injury) (HCC) 01/07/2023   Neurogenic bladder 01/07/2023   FTT (failure to thrive) in adult 12/09/2022   Primary insomnia 09/10/2022   Impaired functional mobility, balance, gait, and endurance 06/09/2022   Type 2 diabetes mellitus with diabetic neuropathy, with long-term current use of insulin  (HCC) 04/24/2022   Hypothyroidism 04/24/2022   Hyperlipidemia associated with type 2 diabetes mellitus (HCC) 04/24/2022   Iron deficiency anemia 04/24/2022   Chronic sensorimotor polyneuropathy with axonal and demyelinating features 01/12/2022   Osteoarthritis of knees (Bilateral) 01/12/2022   Tricompartment osteoarthritis of knees (Bilateral) 01/12/2022    Grade 1 Retrolisthesis (9mm) of L5 over S1 01/12/2022   History of proximal humerus fracture (Left) 01/12/2022   Vitamin D  deficiency 01/12/2022   Chronic peripheral neuropathic pain 01/12/2022   Disorder of skeletal system 11/12/2021   Problems influencing health status 11/12/2021   Chronic hand  pain (1ry area of Pain) (Bilateral) (L>R) 11/12/2021   Chronic feet pain (3ry area of Pain) (Bilateral) (L>R) 11/12/2021   Chronic shoulder pain (4th area of Pain) (Left) 11/12/2021   Chronic knee pain (5th area of Pain) (Bilateral) (L>R) 11/12/2021   Lumbar facet syndrome (Bilateral) 11/12/2021   COVID-19 virus infection 07/22/2021   Chronic pain syndrome 07/17/2021   S/P total thyroidectomy 03/28/2021   Thyroid  nodule    Schamberg's purpura 10/09/2019   Common migraine with intractable migraine 06/18/2015   DDD (degenerative disc disease),  lumbosacral 05/03/2015   Insomnia due to medical condition 05/03/2015   Disorder of peripheral nervous system 05/03/2015   Diabetic peripheral neuropathy (HCC) 03/06/2015   Obesity 03/06/2015   GERD (gastroesophageal reflux disease) 03/06/2015   Multinodular goiter 03/06/2015   RLS (restless legs syndrome) 03/06/2015   Chronic low back pain (2ry area of Pain) (Bilateral) (L>R) w/o sciatica 03/06/2015    ONSET DATE: 06/18/22  REFERRING DIAG: S72.91XD (ICD-10-CM) - Unspecified fracture of right femur, subsequent encounter for closed fracture with routine healing   THERAPY DIAG:  Difficulty in walking, not elsewhere classified  Muscle weakness (generalized)  Other abnormalities of gait and mobility  Unsteadiness on feet  Other low back pain  Rationale for Evaluation and Treatment: Rehabilitation  SUBJECTIVE:                                                                                                                                                                                             SUBJECTIVE STATEMENT:  Pt reports that she has had her fluid pills increased and she's been going 3x a night now.  Pt advised that if she needs to go to the bathroom to feel free.    Pt accompanied by: Spouse   PERTINENT HISTORY: Pt is a pleasant 58 y/o female known to PT clinic, returning for impairments due to R femur fx sustained in 2023. Pt hx includes fall in June 2023 where pt suffered R femur fx followed by surgery where pt had plate and screws placed in R knee and rod in R femur. At the time pt with hx of multiple falls, now pt reports no falls in last 6 months. Since previous d/c from PT on 05/13/2023, pt has been consistently going to [facility] clinic 1x/week, and now returns to [facility] for new year. Pt reports she ambulated with RW while at [facility] clinic, has been able to perform STS to RW from higher surfaces. She would like to continue working on her mobility and strength, including both UE and  LE strength. She still struggles with putting on her shoes and socks  and getting from her bed to the commode. She reports standing at [facility] also limited due to chronic low back pain. She reports she'll likely get injections to tx LBP in future. Pt still performing previous HEP given, typically she completes 1 set of each exercise for 25-30 reps. Says this is easy. PMH per chart significant for arthritis, blood clotting disorder, DDD (lumbar), DM, headache, HTN, neuropathy, vertigo   PAIN:  Are you having pain? Yes: NPRS scale: not numerically rated Pain location: low back, bilat hand pain hurts a lot all the time  Pain description:   Aggravating factors: standing limited due to LBP in position Relieving factors: wearing gloves helps with hand pain  PRECAUTIONS: Fall  RED FLAGS: None   WEIGHT BEARING RESTRICTIONS: No  FALLS: Has patient fallen in last 6 months? No  LIVING ENVIRONMENT: via chart, pt confirms the following is still correct Lives with: lives with their family and lives with their spouse Lives in: House/apartment Stairs: No Has following equipment at home: Environmental consultant - 2 wheeled, Wheelchair (manual), Shower bench, bed side commode, and Grab bars  PLOF: Needs assistance with ADLs  PATIENT GOALS: Get out from bed to walker to bedside commode with or without LE braces donned, improve strength in UE/LE  OBJECTIVE:  Note: Objective measures were completed at Evaluation unless otherwise noted.  DIAGNOSTIC FINDINGS:   Via chart [facility] care 05/13/2023 XR Femur 2 views right: Impression  Unchanged position and alignment with further osseous bridging at the distal femur fracture following intramedullary rod fixation. And findings FINDINGS:  Position and alignment of the comminuted distal femur fracture are unchanged after placement of a retrograde interlocking intramedullary rod. Proximal and distal interlocking components articulate appropriately with the rod. There is  progressive osseous bridging with remodeling at the fracture site, there is no new fracture or dislocation.   COGNITION: Overall cognitive status: Within functional limits for tasks assessed   SENSATION: Reports impaired BLE, does have sensation on tops of thighs per report   COORDINATION: WFL UE rapid alt movement WFL LE with heel to shin, but unable to perform rapid alt movement due to weakness in bilat DFs  EDEMA:  Reports no swelling    POSTURE: rounded shoulders and forward head  LOWER EXTREMITY ROM:     Slight impairment with R ankle DF > L with PROM Slight impairment of L knee extension and somewhat painful    LOWER EXTREMITY MMT:    Grossly 4/5 in BLE exception is pt unable to complete DF bilat, other areas of significant mm deficit include hip flexors, abductors and hamstrings bilat  TRANSFERS:  Attempted STS to RW from WC during eval. Unable to perform from wheelchair even with max assist. Pt reports she has been able to complete STS from higher surfaces and this is how she practiced in [facility]  Assistive device utilized: Walker - 2 wheeled  Sit to stand: unable from standard chair height/WC see above  FUNCTIONAL TESTS:  Attempted STS from [facility], unable at this time. Will attempt functional test like 30 sec STS in future as pt able  PATIENT SURVEYS:  LEFS 50/80 FOTO 54  TREATMENT DATE: 08/15/2024   TherAct:   Gait with Rollator, CGA, and WC follow: - bout 1: 57' - bout 2: 32' - bout 3: 28'  STS with FWW for UE support in standing x8 - pt utilizes arm rests of WC to assist with standing and control descent to sitting   TherEx:  Seated marches with 2.5# AW donned, 2x10 each LE Seated LAQ with 2.5# AW donned, 2x10 each LE Seated  hip flexion and lateral step, and back, 2.5# AW donned, 2x10 each LE Seated heel lifts, 2.5# AW  donned, 2x10     PATIENT EDUCATION: Education details: assessment findings, goals, plan, HEP. WC planning HEP expansion  Pt educated throughout session about proper posture and technique with exercises. Improved exercise technique, movement at target joints, use of target muscles after min to mod verbal, visual, tactile cues.  Person educated: Patient and Spouse Education method: Explanation, Verbal cues, and Handouts Education comprehension: verbalized understanding and returned demonstration  HOME EXERCISE PROGRAM: Access Code: [email or web address]  URL: [email or web address] Date: 07/05/2024  Prepared by: Marina  Moser  Exercises  - Standing Anterior Posterior Weight Shift with Chair  - 1 x daily - 7 x weekly - 2 sets - 10 reps - 5 hold  - Standing Weight Shift Side to Side  - 1 x daily - 7 x weekly - 2 sets - 10 reps - 5 hold  - Standing March with Counter Support  - 1 x daily - 7 x weekly - 2 sets - 10 reps - 5 hold  - Standing Toe Taps  - 1 x daily - 7 x weekly - 2 sets - 10 reps - 5 hold  - Squat with Chair and Counter Support  - 1 x daily - 7 x weekly - 2 sets - 10 reps - 5 hold   Access Code: [email or web address] URL: [email or web address] Date: 12/29/2023 Prepared by: Darryle Patten  Exercises - Seated Hamstring Curls with Resistance  - 1 x daily - 5-6 x weekly - 3-4 sets - 20-25 reps - Seated Hip Abduction with Resistance  - 1 x daily - 5-6 x weekly - 3-4 sets - 20-25 reps - Seated March  - 1 x daily - 5-6 x weekly - 3-4 sets - 20-25 reps   Access Code: [email or web address] URL: [email or web address] Date: 01/26/2024 Prepared by: Massie Dollar  Exercises - Supine Heel Slide  - 1 x daily - 7 x weekly - 3 sets - 10 reps - Supine Straight Leg Raises  - 1 x daily - 7 x weekly - 3 sets - 10 reps - Hooklying Clamshell with Resistance  - 1 x daily - 7 x weekly - 3 sets - 10 reps - Supine Hip Flexion  - 1 x daily - 7 x weekly - 3 sets - 10 reps - Supine Single Leg  Ankle Pumps  - 1 x daily - 7 x weekly - 3 sets - 10 reps - Supine Bridge  - 1 x daily - 7 x weekly - 3 sets - 10 reps GOALS: Goals reviewed with patient? Yes  SHORT TERM GOALS: Target date: 02/09/2024   Patient will be independent in home exercise program to improve strength/mobility for better functional independence with ADLs. Baseline: initiated Goal status:MET    LONG TERM GOALS: Target date:  09/04/2024     Patient will perform sit to stand with min-mod assist from standard chair height in order to indicate improved lower extremity functional strength and improved ability to perform functional transfers and  functional leg activities within her home.  Baseline: mod assist with +2 for safety.  4/2: mod assist 6/23: CGA from wheelchair  7/29: CGA from [facility] with RW  Goal status: MET   2.  Pt will improve LEFS by 10 points or more in order to indicate improved LE function and mobility.  Baseline: 50 4/2: 32 7/29: 31  Goal status: progressing   3.  Patient will increase FOTO score to equal to or greater than  59   to demonstrate statistically significant improvement in mobility and quality of life.  Baseline: 54 Goal status: goal d/c'ed.   4.  Pt will require no more than min assist for transfer from bed to bedside commode Baseline: supervision assist with  slide board transfer. To mat table  Using walker for transfer to [facility] with mod assist for safety and  6/23: husband helps supervise and set up with CGA 7/29: able to transfer to and from [facility] with RW without assist from Husband  Goal status: MET  5.  The pt will demo ability to don socks and shoes indep or mod I for increased ease with dressing and improved independence Baseline: required max assist from husband  4/2: pt states that she is donning night socks. Is still requiring max assist for LLE and moderate assist for the RLE  6/23; husband requires max A for socks and shoes 7/29: reports that she attempted to put on  socks and shoes, without success due to strength and ROm deficits in Bil hips; but was  Goal status: Progressing  6.  The pt will demo ability to complete 30 second STS test to indicate increased ease with transfers and BLE strength Baseline: 2 sit<>stand from [facility] with cushion in place and  +2 for safety.  4/2: 3 with mod assist and +2 to stabilize RW  6/23: 3 with CGA; +2 to stabilize RW without brace. With KAFOs on: 3.5 with CGA; +2 to stabilize  7/29: 4.5 with RW and CGA from arm chair with airex pad.  Goal status: progressing   7.  Patient will increase 10 meter walk test to >1.9m/s as to improve gait speed for better community ambulation and to reduce fall risk. Baseline: 5/23: 2 min 2 seconds with orthotics on; 1 min 7 seconds without orthotics with wheelchair follow and BrW.  7/29: Average: 46.79  sec 0.46m/s  with RW and WC follow , no Bracing Goal status: NEW   ASSESSMENT:  CLINICAL IMPRESSION:  Pt responded well to the exercise and put forth great effort throughout the session.  Pt noted to be a little more fatigued with the use of the rollator for the first time in a long time.  Pt performed well and was able to transfer to sitting in the rollator and back up without any complications.  Pt advised to utilize it against a wall or sturdy object when going to sit to make sure it was steady when attempting to sit.  Pt understanding and will attempt at a later date/time.  Pt encouraged to continue with HEP to improve overall strength.   Pt will continue to benefit from skilled therapy to address remaining deficits in order to improve overall QoL and return to PLOF.       OBJECTIVE IMPAIRMENTS: Abnormal gait, decreased activity tolerance, decreased balance, decreased coordination, decreased endurance, decreased mobility, difficulty walking, decreased ROM, decreased strength, hypomobility, impaired sensation, improper body mechanics, postural dysfunction, and pain.   ACTIVITY  LIMITATIONS: carrying, lifting, bending, standing, squatting,  stairs, transfers, bed mobility, bathing, toileting, dressing, and locomotion level  PARTICIPATION LIMITATIONS: meal prep, cleaning, laundry, driving, shopping, community activity, and yard work  PERSONAL FACTORS: Fitness, Sex, Time since onset of injury/illness/exacerbation, and 3+ comorbidities: PMH per chart significant for arthritis, blood clotting disorder, DDD (lumbar), DM, headache, HTN, neuropathy, vertigo are also affecting patient's functional outcome.   REHAB POTENTIAL: Good  CLINICAL DECISION MAKING: Evolving/moderate complexity  EVALUATION COMPLEXITY: Moderate  PLAN:  PT FREQUENCY: 1-2x/week  PT DURATION: 12 weeks  PLANNED INTERVENTIONS: 97164- PT Re-evaluation, 97110-Therapeutic exercises, 97530- Therapeutic activity, [id number]- Neuromuscular re-education, 97535- Self Care, [id number]- Manual therapy, [phone]- Gait training, [phone]- Orthotic Fit/training, [phone]- Canalith repositioning, [id number]- Electrical stimulation (manual), [id number]- Ultrasound, Patient/Family education, Balance training, Stair training, Taping, Dry Needling, Joint mobilization, Spinal mobilization, Vestibular training, DME instructions, Wheelchair mobility training, Cryotherapy, and Moist heat  PLAN FOR NEXT SESSION:   Continue stair training! Continue BLE strengthening.  Gait as tolerated  Ankle AROM/strengthening    Fonda Simpers, PT, DPT Physical Therapist - [facility]  08/15/24, 6:10 PM

## 2024-08-18 ENCOUNTER — Telehealth: Payer: Self-pay | Admitting: Family Medicine

## 2024-08-18 ENCOUNTER — Other Ambulatory Visit: Payer: Self-pay

## 2024-08-18 MED ORDER — LISINOPRIL 2.5 MG PO TABS: 2.5000 mg | ORAL_TABLET | Freq: Every day | ORAL | 3 refills | Status: DC

## 2024-08-18 NOTE — Telephone Encounter (Signed)
 Converted into a refill request

## 2024-08-18 NOTE — Telephone Encounter (Signed)
 [facility] is asking for refills on Lisinopril  2.5 mg. #90 with 3 RF

## 2024-08-18 NOTE — Telephone Encounter (Signed)
 Prescription sent in

## 2024-08-22 ENCOUNTER — Ambulatory Visit: Attending: Orthopedic Surgery

## 2024-08-22 DIAGNOSIS — M5459 Other low back pain: Secondary | ICD-10-CM | POA: Diagnosis not present

## 2024-08-22 DIAGNOSIS — M6281 Muscle weakness (generalized): Secondary | ICD-10-CM | POA: Diagnosis not present

## 2024-08-22 DIAGNOSIS — R262 Difficulty in walking, not elsewhere classified: Secondary | ICD-10-CM | POA: Diagnosis not present

## 2024-08-22 DIAGNOSIS — R2681 Unsteadiness on feet: Secondary | ICD-10-CM | POA: Insufficient documentation

## 2024-08-22 DIAGNOSIS — R2689 Other abnormalities of gait and mobility: Secondary | ICD-10-CM | POA: Insufficient documentation

## 2024-08-22 NOTE — Therapy (Signed)
 OUTPATIENT PHYSICAL THERAPY NEURO TREATMENT    Patient Name: Gabriella Marsh MRN: [id number] DOB:1966-04-05, 58 y.o., female Today's Date: 08/22/2024   PCP: Donzella Lauraine SAILOR, DO REFERRING PROVIDER: Laurence Prentice DASEN, MD  END OF SESSION:   PT End of Session - 08/22/24 1616     Visit Number 24    Number of Visits 25    Date for PT Re-Evaluation 09/04/24    Progress Note Due on Visit 20    PT Start Time 1616    PT Stop Time 1700    PT Time Calculation (min) 44 min    Equipment Utilized During Treatment Gait belt    Activity Tolerance Patient tolerated treatment well    Behavior During Therapy WFL for tasks assessed/performed          Past Medical History:  Diagnosis Date   Arthritis    knees   Blood clotting disorder (HCC)    on toe    Degenerative disc disease, lumbar    Diabetes mellitus without complication (HCC)    GERD (gastroesophageal reflux disease)    Headache    daily - AM (has not been able to have SPG blocks lately)   Hyperlipidemia    Hypertension    Neuropathy    feet   Vertigo    3-4x/yr   Past Surgical History:  Procedure Laterality Date   ABDOMINAL HYSTERECTOMY     But still has cervix   AMPUTATION TOE Right 07/24/2021   Procedure: AMPUTATION TOE;  Surgeon: Neill Boas, DPM;  Location: [facility] ORS;  Service: Podiatry;  Laterality: Right;   CESAREAN SECTION     CHOLECYSTECTOMY     COLONOSCOPY WITH PROPOFOL  N/A 02/20/2022   Procedure: COLONOSCOPY WITH PROPOFOL ;  Surgeon: Therisa Bi, MD;  Location: [facility] ENDOSCOPY;  Service: Gastroenterology;  Laterality: N/A;   left arm frature  10/12/2020   OVARIAN CYST SURGERY     PARATHYROIDECTOMY  03/28/2021   Procedure: PARATHYROIDECTOMY AUTOTRANSPLANT;  Surgeon: Marolyn Nest, MD;  Location: [facility] ORS;  Service: General;;   right femur surgery Right    SHOULDER SURGERY Left 12/26/014   Dr. CECILE Glenn, [facility]   THYROIDECTOMY N/A 03/28/2021   Procedure: THYROIDECTOMY, total;  Surgeon: Marolyn Nest, MD;  Location: [facility] ORS;  Service: General;  Laterality: N/A;  Provider requesting 3 hours /180 minutes for procedure   TONSILLECTOMY AND ADENOIDECTOMY     UTERINE FIBROID SURGERY     Patient Active Problem List   Diagnosis Date Noted   Neuropathy 04/21/2024   Lymphedema 04/21/2024   Wheelchair dependence 03/10/2023   Hypertension associated with diabetes (HCC) 03/10/2023   AKI (acute kidney injury) (HCC) 01/07/2023   Neurogenic bladder 01/07/2023   FTT (failure to thrive) in adult 12/09/2022   Primary insomnia 09/10/2022   Impaired functional mobility, balance, gait, and endurance 06/09/2022   Type 2 diabetes mellitus with diabetic neuropathy, with long-term current use of insulin  (HCC) 04/24/2022   Hypothyroidism 04/24/2022   Hyperlipidemia associated with type 2 diabetes mellitus (HCC) 04/24/2022   Iron deficiency anemia 04/24/2022   Chronic sensorimotor polyneuropathy with axonal and demyelinating features 01/12/2022   Osteoarthritis of knees (Bilateral) 01/12/2022   Tricompartment osteoarthritis of knees (Bilateral) 01/12/2022    Grade 1 Retrolisthesis (9mm) of L5 over S1 01/12/2022   History of proximal humerus fracture (Left) 01/12/2022   Vitamin D  deficiency 01/12/2022   Chronic peripheral neuropathic pain 01/12/2022   Disorder of skeletal system 11/12/2021   Problems influencing health status 11/12/2021   Chronic  hand pain (1ry area of Pain) (Bilateral) (L>R) 11/12/2021   Chronic feet pain (3ry area of Pain) (Bilateral) (L>R) 11/12/2021   Chronic shoulder pain (4th area of Pain) (Left) 11/12/2021   Chronic knee pain (5th area of Pain) (Bilateral) (L>R) 11/12/2021   Lumbar facet syndrome (Bilateral) 11/12/2021   COVID-19 virus infection 07/22/2021   Chronic pain syndrome 07/17/2021   S/P total thyroidectomy 03/28/2021   Thyroid  nodule    Schamberg's purpura 10/09/2019   Common migraine with intractable migraine 06/18/2015   DDD (degenerative disc disease),  lumbosacral 05/03/2015   Insomnia due to medical condition 05/03/2015   Disorder of peripheral nervous system 05/03/2015   Diabetic peripheral neuropathy (HCC) 03/06/2015   Obesity 03/06/2015   GERD (gastroesophageal reflux disease) 03/06/2015   Multinodular goiter 03/06/2015   RLS (restless legs syndrome) 03/06/2015   Chronic low back pain (2ry area of Pain) (Bilateral) (L>R) w/o sciatica 03/06/2015    ONSET DATE: 06/18/22  REFERRING DIAG: S72.91XD (ICD-10-CM) - Unspecified fracture of right femur, subsequent encounter for closed fracture with routine healing   THERAPY DIAG:  Difficulty in walking, not elsewhere classified  Muscle weakness (generalized)  Other abnormalities of gait and mobility  Unsteadiness on feet  Other low back pain  Rationale for Evaluation and Treatment: Rehabilitation  SUBJECTIVE:                                                                                                                                                                                             SUBJECTIVE STATEMENT:  Pt reports that she went to visit her MIL over the weekend with her husband.  Pt reports the MIL is not doing well at this time, reporting that cystic fibrosis is causing her to not be able to breathe very well.  Pt accompanied by: Spouse   PERTINENT HISTORY: Pt is a pleasant 58 y/o female known to PT clinic, returning for impairments due to R femur fx sustained in 2023. Pt hx includes fall in June 2023 where pt suffered R femur fx followed by surgery where pt had plate and screws placed in R knee and rod in R femur. At the time pt with hx of multiple falls, now pt reports no falls in last 6 months. Since previous d/c from PT on 05/13/2023, pt has been consistently going to [facility] clinic 1x/week, and now returns to [facility] for new year. Pt reports she ambulated with RW while at [facility] clinic, has been able to perform STS to RW from higher surfaces. She would like to continue  working on her mobility and strength, including both UE and LE strength. She still  struggles with putting on her shoes and socks and getting from her bed to the commode. She reports standing at [facility] also limited due to chronic low back pain. She reports she'll likely get injections to tx LBP in future. Pt still performing previous HEP given, typically she completes 1 set of each exercise for 25-30 reps. Says this is easy. PMH per chart significant for arthritis, blood clotting disorder, DDD (lumbar), DM, headache, HTN, neuropathy, vertigo   PAIN:  Are you having pain? Yes: NPRS scale: not numerically rated Pain location: low back, bilat hand pain hurts a lot all the time  Pain description:   Aggravating factors: standing limited due to LBP in position Relieving factors: wearing gloves helps with hand pain  PRECAUTIONS: Fall  RED FLAGS: None   WEIGHT BEARING RESTRICTIONS: No  FALLS: Has patient fallen in last 6 months? No  LIVING ENVIRONMENT: via chart, pt confirms the following is still correct Lives with: lives with their family and lives with their spouse Lives in: House/apartment Stairs: No Has following equipment at home: Environmental consultant - 2 wheeled, Wheelchair (manual), Shower bench, bed side commode, and Grab bars  PLOF: Needs assistance with ADLs  PATIENT GOALS: Get out from bed to walker to bedside commode with or without LE braces donned, improve strength in UE/LE  OBJECTIVE:  Note: Objective measures were completed at Evaluation unless otherwise noted.  DIAGNOSTIC FINDINGS:   Via chart [facility] care 05/13/2023 XR Femur 2 views right: Impression  Unchanged position and alignment with further osseous bridging at the distal femur fracture following intramedullary rod fixation. And findings FINDINGS:  Position and alignment of the comminuted distal femur fracture are unchanged after placement of a retrograde interlocking intramedullary rod. Proximal and distal interlocking  components articulate appropriately with the rod. There is progressive osseous bridging with remodeling at the fracture site, there is no new fracture or dislocation.   COGNITION: Overall cognitive status: Within functional limits for tasks assessed   SENSATION: Reports impaired BLE, does have sensation on tops of thighs per report   COORDINATION: WFL UE rapid alt movement WFL LE with heel to shin, but unable to perform rapid alt movement due to weakness in bilat DFs  EDEMA:  Reports no swelling    POSTURE: rounded shoulders and forward head  LOWER EXTREMITY ROM:     Slight impairment with R ankle DF > L with PROM Slight impairment of L knee extension and somewhat painful    LOWER EXTREMITY MMT:    Grossly 4/5 in BLE exception is pt unable to complete DF bilat, other areas of significant mm deficit include hip flexors, abductors and hamstrings bilat  TRANSFERS:  Attempted STS to RW from WC during eval. Unable to perform from wheelchair even with max assist. Pt reports she has been able to complete STS from higher surfaces and this is how she practiced in [facility]  Assistive device utilized: Walker - 2 wheeled  Sit to stand: unable from standard chair height/WC see above  FUNCTIONAL TESTS:  Attempted STS from [facility], unable at this time. Will attempt functional test like 30 sec STS in future as pt able  PATIENT SURVEYS:  LEFS 50/80 FOTO 54  TREATMENT DATE: 08/22/2024  *** TherAct:   Gait with Rollator, CGA, and WC follow: - bout 1: 8' - bout 2: 32' - bout 3: 68'  STS with FWW for UE support in standing x8 - pt utilizes arm rests of WC to assist with standing and control descent to sitting   TherEx:  Seated marches with 2.5# AW donned, 2x10 each LE Seated LAQ with 2.5# AW donned, 2x10 each LE Seated  hip flexion and lateral step, and  back, 2.5# AW donned, 2x10 each LE Seated heel lifts, 2.5# AW donned, 2x10     PATIENT EDUCATION: Education details: assessment findings, goals, plan, HEP. WC planning HEP expansion  Pt educated throughout session about proper posture and technique with exercises. Improved exercise technique, movement at target joints, use of target muscles after min to mod verbal, visual, tactile cues.  Person educated: Patient and Spouse Education method: Explanation, Verbal cues, and Handouts Education comprehension: verbalized understanding and returned demonstration  HOME EXERCISE PROGRAM: Access Code: [email or web address]  URL: [email or web address] Date: 07/05/2024  Prepared by: Marina  Moser  Exercises  - Standing Anterior Posterior Weight Shift with Chair  - 1 x daily - 7 x weekly - 2 sets - 10 reps - 5 hold  - Standing Weight Shift Side to Side  - 1 x daily - 7 x weekly - 2 sets - 10 reps - 5 hold  - Standing March with Counter Support  - 1 x daily - 7 x weekly - 2 sets - 10 reps - 5 hold  - Standing Toe Taps  - 1 x daily - 7 x weekly - 2 sets - 10 reps - 5 hold  - Squat with Chair and Counter Support  - 1 x daily - 7 x weekly - 2 sets - 10 reps - 5 hold   Access Code: [email or web address] URL: [email or web address] Date: 12/29/2023 Prepared by: Darryle Patten  Exercises - Seated Hamstring Curls with Resistance  - 1 x daily - 5-6 x weekly - 3-4 sets - 20-25 reps - Seated Hip Abduction with Resistance  - 1 x daily - 5-6 x weekly - 3-4 sets - 20-25 reps - Seated March  - 1 x daily - 5-6 x weekly - 3-4 sets - 20-25 reps   Access Code: [email or web address] URL: [email or web address] Date: 01/26/2024 Prepared by: Massie Dollar  Exercises - Supine Heel Slide  - 1 x daily - 7 x weekly - 3 sets - 10 reps - Supine Straight Leg Raises  - 1 x daily - 7 x weekly - 3 sets - 10 reps - Hooklying Clamshell with Resistance  - 1 x daily - 7 x weekly - 3 sets - 10 reps - Supine Hip Flexion  - 1  x daily - 7 x weekly - 3 sets - 10 reps - Supine Single Leg Ankle Pumps  - 1 x daily - 7 x weekly - 3 sets - 10 reps - Supine Bridge  - 1 x daily - 7 x weekly - 3 sets - 10 reps GOALS: Goals reviewed with patient? Yes  SHORT TERM GOALS: Target date: 02/09/2024   Patient will be independent in home exercise program to improve strength/mobility for better functional independence with ADLs. Baseline: initiated Goal status:MET    LONG TERM GOALS: Target date:  09/04/2024     Patient will perform sit to stand with min-mod assist from standard chair height in order to indicate improved lower extremity functional strength and improved ability to perform functional transfers and  functional leg activities within her home.  Baseline: mod assist with +2 for safety.  4/2: mod assist 6/23: CGA from wheelchair  7/29: CGA from [facility] with RW  Goal status: MET   2.  Pt will improve LEFS by 10 points or more in order to indicate improved LE function and mobility.  Baseline: 50 4/2: 32 7/29: 31  Goal status: progressing   3.  Patient will increase FOTO score to equal to or greater than  59   to demonstrate statistically significant improvement in mobility and quality of life.  Baseline: 54 Goal status: goal d/c'ed.   4.  Pt will require no more than min assist for transfer from bed to bedside commode Baseline: supervision assist with  slide board transfer. To mat table  Using walker for transfer to [facility] with mod assist for safety and  6/23: husband helps supervise and set up with CGA 7/29: able to transfer to and from [facility] with RW without assist from Husband  Goal status: MET  5.  The pt will demo ability to don socks and shoes indep or mod I for increased ease with dressing and improved independence Baseline: required max assist from husband  4/2: pt states that she is donning night socks. Is still requiring max assist for LLE and moderate assist for the RLE  6/23; husband requires max A for  socks and shoes 7/29: reports that she attempted to put on socks and shoes, without success due to strength and ROm deficits in Bil hips; but was  Goal status: Progressing  6.  The pt will demo ability to complete 30 second STS test to indicate increased ease with transfers and BLE strength Baseline: 2 sit<>stand from [facility] with cushion in place and  +2 for safety.  4/2: 3 with mod assist and +2 to stabilize RW  6/23: 3 with CGA; +2 to stabilize RW without brace. With KAFOs on: 3.5 with CGA; +2 to stabilize  7/29: 4.5 with RW and CGA from arm chair with airex pad.  Goal status: progressing   7.  Patient will increase 10 meter walk test to >1.26m/s as to improve gait speed for better community ambulation and to reduce fall risk. Baseline: 5/23: 2 min 2 seconds with orthotics on; 1 min 7 seconds without orthotics with wheelchair follow and BrW.  7/29: Average: 46.79  sec 0.17m/s  with RW and WC follow , no Bracing Goal status: NEW   ASSESSMENT:  CLINICAL IMPRESSION:  Pt responded well to the exercise and put forth great effort throughout the session.  Pt noted to be a little more fatigued with the use of the rollator for the first time in a long time.  Pt performed well and was able to transfer to sitting in the rollator and back up without any complications.  Pt advised to utilize it against a wall or sturdy object when going to sit to make sure it was steady when attempting to sit.  Pt understanding and will attempt at a later date/time.  Pt encouraged to continue with HEP to improve overall strength.   Pt will continue to benefit from skilled therapy to address remaining deficits in order to improve overall QoL and return to PLOF.       OBJECTIVE IMPAIRMENTS: Abnormal gait, decreased activity tolerance, decreased balance, decreased coordination, decreased endurance, decreased mobility, difficulty walking, decreased ROM, decreased strength, hypomobility, impaired sensation, improper body  mechanics, postural dysfunction, and pain.   ACTIVITY LIMITATIONS: carrying, lifting, bending, standing, squatting,  stairs, transfers, bed mobility, bathing, toileting, dressing, and locomotion level  PARTICIPATION LIMITATIONS: meal prep, cleaning, laundry, driving, shopping, community activity, and yard work  PERSONAL FACTORS: Fitness, Sex, Time since onset of injury/illness/exacerbation, and 3+ comorbidities: PMH per chart significant for arthritis, blood clotting disorder, DDD (lumbar), DM, headache, HTN, neuropathy, vertigo are also affecting patient's functional outcome.   REHAB POTENTIAL: Good  CLINICAL DECISION MAKING: Evolving/moderate complexity  EVALUATION COMPLEXITY: Moderate  PLAN:  PT FREQUENCY: 1-2x/week  PT DURATION: 12 weeks  PLANNED INTERVENTIONS: [id number]- PT Re-evaluation, 97110-Therapeutic exercises, 97530- Therapeutic activity, [id number]- Neuromuscular re-education, 97535- Self Care, [id number]- Manual therapy, [phone]- Gait training, [phone]- Orthotic Fit/training, [phone]- Canalith repositioning, [id number]- Electrical stimulation (manual), [id number]- Ultrasound, Patient/Family education, Balance training, Stair training, Taping, Dry Needling, Joint mobilization, Spinal mobilization, Vestibular training, DME instructions, Wheelchair mobility training, Cryotherapy, and Moist heat  PLAN FOR NEXT SESSION:   Continue stair training! Continue BLE strengthening.  Gait as tolerated  Ankle AROM/strengthening    Fonda Simpers, PT, DPT Physical Therapist - [facility]  08/22/24, 4:17 PM

## 2024-08-24 DIAGNOSIS — E1165 Type 2 diabetes mellitus with hyperglycemia: Secondary | ICD-10-CM | POA: Diagnosis not present

## 2024-08-29 ENCOUNTER — Ambulatory Visit

## 2024-08-29 DIAGNOSIS — R2689 Other abnormalities of gait and mobility: Secondary | ICD-10-CM

## 2024-08-29 DIAGNOSIS — R262 Difficulty in walking, not elsewhere classified: Secondary | ICD-10-CM

## 2024-08-29 DIAGNOSIS — M5459 Other low back pain: Secondary | ICD-10-CM

## 2024-08-29 DIAGNOSIS — M6281 Muscle weakness (generalized): Secondary | ICD-10-CM | POA: Diagnosis not present

## 2024-08-29 DIAGNOSIS — R2681 Unsteadiness on feet: Secondary | ICD-10-CM

## 2024-08-29 NOTE — Therapy (Signed)
 OUTPATIENT PHYSICAL THERAPY NEURO TREATMENT/DISCHARGE    Patient Name: Gabriella Marsh MRN: [id number] DOB:[birth year], 58 y.o., female Today's Date: 08/29/2024   PCP: Donzella Lauraine SAILOR, DO REFERRING PROVIDER: Laurence Prentice DASEN, MD  END OF SESSION:   PT End of Session - 08/29/24 1625     Visit Number 25    Number of Visits 25    Date for PT Re-Evaluation 09/04/24    Progress Note Due on Visit 20    PT Start Time 1623    PT Stop Time 1701    PT Time Calculation (min) 38 min    Equipment Utilized During Treatment Gait belt    Activity Tolerance Patient tolerated treatment well    Behavior During Therapy WFL for tasks assessed/performed          Past Medical History:  Diagnosis Date   Arthritis    knees   Blood clotting disorder (HCC)    on toe    Degenerative disc disease, lumbar    Diabetes mellitus without complication (HCC)    GERD (gastroesophageal reflux disease)    Headache    daily - AM (has not been able to have SPG blocks lately)   Hyperlipidemia    Hypertension    Neuropathy    feet   Vertigo    3-4x/yr   Past Surgical History:  Procedure Laterality Date   ABDOMINAL HYSTERECTOMY     But still has cervix   AMPUTATION TOE Right 07/24/2021   Procedure: AMPUTATION TOE;  Surgeon: Neill Boas, DPM;  Location: [facility] OR[facility];  Service: Podiatry;  Laterality: Right;   CESAREAN SECTION     CHOLECYSTECTOMY     COLONOSCOPY WITH PROPOFOL  N/A 02/20/2022   Procedure: COLONOSCOPY WITH PROPOFOL ;  Surgeon: Therisa Bi, MD;  Location: [facility];  Service: Gastroenterology;  Laterality: N/A;   left arm frature  10/12/2020   OVARIAN CYST SURGERY     PARATHYROIDECTOMY  03/28/2021   Procedure: PARATHYROIDECTOMY AUTOTRANSPLANT;  Surgeon: Marolyn Nest, MD;  Location: [facility];  Service: General;;   right femur surgery Right    SHOULDER SURGERY Left 12/26/014   Dr. CECILE Glenn, [facility]   THYROIDECTOMY N/A 03/28/2021   Procedure: THYROIDECTOMY, total;  Surgeon:  Marolyn Nest, MD;  Location: [facility];  Service: General;  Laterality: N/A;  Provider requesting 3 hours /180 minutes for procedure   TONSILLECTOMY AND ADENOIDECTOMY     UTERINE FIBROID SURGERY     Patient Active Problem List   Diagnosis Date Noted   Neuropathy 04/21/2024   Lymphedema 04/21/2024   Wheelchair dependence 03/10/2023   Hypertension associated with diabetes (HCC) 03/10/2023   AKI (acute kidney injury) (HCC) 01/07/2023   Neurogenic bladder 01/07/2023   FTT (failure to thrive) in adult 12/09/2022   Primary insomnia 09/10/2022   Impaired functional mobility, balance, gait, and endurance 06/09/2022   Type 2 diabetes mellitus with diabetic neuropathy, with long-term current use of insulin  (HCC) 04/24/2022   Hypothyroidism 04/24/2022   Hyperlipidemia associated with type 2 diabetes mellitus (HCC) 04/24/2022   Iron deficiency anemia 04/24/2022   Chronic sensorimotor polyneuropathy with axonal and demyelinating features 01/12/2022   Osteoarthritis of knees (Bilateral) 01/12/2022   Tricompartment osteoarthritis of knees (Bilateral) 01/12/2022    Grade 1 Retrolisthesis (9mm) of L5 over S1 01/12/2022   History of proximal humerus fracture (Left) 01/12/2022   Vitamin D  deficiency 01/12/2022   Chronic peripheral neuropathic pain 01/12/2022   Disorder of skeletal system 11/12/2021   Problems influencing health status 11/12/2021   Chronic  hand pain (1ry area of Pain) (Bilateral) (L>R) 11/12/2021   Chronic feet pain (3ry area of Pain) (Bilateral) (L>R) 11/12/2021   Chronic shoulder pain (4th area of Pain) (Left) 11/12/2021   Chronic knee pain (5th area of Pain) (Bilateral) (L>R) 11/12/2021   Lumbar facet syndrome (Bilateral) 11/12/2021   COVID-19 virus infection 07/22/2021   Chronic pain syndrome 07/17/2021   S/P total thyroidectomy 03/28/2021   Thyroid  nodule    Schamberg's purpura 10/09/2019   Common migraine with intractable migraine 06/18/2015   DDD (degenerative disc  disease), lumbosacral 05/03/2015   Insomnia due to medical condition 05/03/2015   Disorder of peripheral nervous system 05/03/2015   Diabetic peripheral neuropathy (HCC) 03/06/2015   Obesity 03/06/2015   GERD (gastroesophageal reflux disease) 03/06/2015   Multinodular goiter 03/06/2015   RLS (restless legs syndrome) 03/06/2015   Chronic low back pain (2ry area of Pain) (Bilateral) (L>R) w/o sciatica 03/06/2015    ONSET DATE: 06/18/22  REFERRING DIAG: S72.91XD (ICD-10-CM) - Unspecified fracture of right femur, subsequent encounter for closed fracture with routine healing   THERAPY DIAG:  No diagnosis found.  Rationale for Evaluation and Treatment: Rehabilitation  SUBJECTIVE:                                                                                                                                                                                             SUBJECTIVE STATEMENT:  Pt reports she was in the rain over the weekend due to going to the race and it being rained out.  Pt reports she is ready for discharge today due to the lack of insurance coverage in pt's.   Pt accompanied by: Spouse   PERTINENT HISTORY: Pt is a pleasant 58 y/o female known to PT clinic, returning for impairments due to R femur fx sustained in 2023. Pt hx includes fall in June 2023 where pt suffered R femur fx followed by surgery where pt had plate and screws placed in R knee and rod in R femur. At the time pt with hx of multiple falls, now pt reports no falls in last 6 months. Since previous d/c from PT on 05/13/2023, pt has been consistently going to [facility] clinic 1x/week, and now returns to [facility] for new year. Pt reports she ambulated with RW while at [facility] clinic, has been able to perform STS to RW from higher surfaces. She would like to continue working on her mobility and strength, including both UE and LE strength. She still struggles with putting on her shoes and socks and getting from her bed to the commode.  She reports standing at [facility] also limited due to  chronic low back pain. She reports she'll likely get injections to tx LBP in future. Pt still performing previous HEP given, typically she completes 1 set of each exercise for 25-30 reps. Says this is easy. PMH per chart significant for arthritis, blood clotting disorder, DDD (lumbar), DM, headache, HTN, neuropathy, vertigo   PAIN:  Are you having pain? Yes: NPRS scale: not numerically rated Pain location: low back, bilat hand pain hurts a lot all the time  Pain description:   Aggravating factors: standing limited due to LBP in position Relieving factors: wearing gloves helps with hand pain  PRECAUTIONS: Fall  RED FLAGS: None   WEIGHT BEARING RESTRICTIONS: No  FALLS: Has patient fallen in last 6 months? No  LIVING ENVIRONMENT: via chart, pt confirms the following is still correct Lives with: lives with their family and lives with their spouse Lives in: House/apartment Stairs: No Has following equipment at home: Environmental consultant - 2 wheeled, Wheelchair (manual), Shower bench, bed side commode, and Grab bars  PLOF: Needs assistance with ADLs  PATIENT GOALS: Get out from bed to walker to bedside commode with or without LE braces donned, improve strength in UE/LE  OBJECTIVE:  Note: Objective measures were completed at Evaluation unless otherwise noted.  DIAGNOSTIC FINDINGS:   Via chart [facility] care 05/13/2023 XR Femur 2 views right: Impression  Unchanged position and alignment with further osseous bridging at the distal femur fracture following intramedullary rod fixation. And findings FINDINGS:  Position and alignment of the comminuted distal femur fracture are unchanged after placement of a retrograde interlocking intramedullary rod. Proximal and distal interlocking components articulate appropriately with the rod. There is progressive osseous bridging with remodeling at the fracture site, there is no new fracture or dislocation.    COGNITION: Overall cognitive status: Within functional limits for tasks assessed   SENSATION: Reports impaired BLE, does have sensation on tops of thighs per report   COORDINATION: WFL UE rapid alt movement WFL LE with heel to shin, but unable to perform rapid alt movement due to weakness in bilat DFs  EDEMA:  Reports no swelling    POSTURE: rounded shoulders and forward head  LOWER EXTREMITY ROM:     Slight impairment with R ankle DF > L with PROM Slight impairment of L knee extension and somewhat painful    LOWER EXTREMITY MMT:    Grossly 4/5 in BLE exception is pt unable to complete DF bilat, other areas of significant mm deficit include hip flexors, abductors and hamstrings bilat  TRANSFERS:  Attempted STS to RW from WC during eval. Unable to perform from wheelchair even with max assist. Pt reports she has been able to complete STS from higher surfaces and this is how she practiced [facility]  Assistive device utilized: Walker - 2 wheeled  Sit to stand: unable from standard chair height/WC see above  FUNCTIONAL TESTS:  Attempted STS from [facility], unable at this time. Will attempt functional test like 30 sec STS in future as pt able  PATIENT SURVEYS:  LEFS 50/80 FOTO 54  TREATMENT DATE: 08/29/2024   TherAct:  Goal assessment performed and noted below for discharge:  Gait with FWW, and CGA: - bout 1: 109' - bout 2: 49'  STS with FWW for UE support in standing x10 - pt utilizes arm rests of chair with airex pad in seat to assist with standing and control descent to sitting    10 Meter Walk Test: Patient instructed to walk 10 meters (32.8 ft) as quickly and as safely as possible at their normal speed Results: 0.22 m/s (45.01 seconds)  Cut off scores:   Household Ambulator  < 0.4 m/s  Limited Community Ambulator  0.4 - 0.8 m/s   [employer]  > 0.8 m/s  Increased fall risk  < 1.40m/s  Crossing a Street  >1.35m/s  MCID 0.05 m/s (small), 0.13 m/s (moderate), 0.06 m/s (significant)  (ANPTA Core Set of Outcome Measures for Adults with Neurologic Conditions, 2018)           PATIENT EDUCATION: Education details: assessment findings, goals, plan, HEP. WC planning HEP expansion  Pt educated throughout session about proper posture and technique with exercises. Improved exercise technique, movement at target joints, use of target muscles after min to mod verbal, visual, tactile cues.  Person educated: Patient and Spouse Education method: Explanation, Verbal cues, and Handouts Education comprehension: verbalized understanding and returned demonstration  HOME EXERCISE PROGRAM: Access Code: [email or web address]  URL: [email or web address] Date: 07/05/2024  Prepared by: Marina  Moser  Exercises  - Standing Anterior Posterior Weight Shift with Chair  - 1 x daily - 7 x weekly - 2 sets - 10 reps - 5 hold  - Standing Weight Shift Side to Side  - 1 x daily - 7 x weekly - 2 sets - 10 reps - 5 hold  - Standing March with Counter Support  - 1 x daily - 7 x weekly - 2 sets - 10 reps - 5 hold  - Standing Toe Taps  - 1 x daily - 7 x weekly - 2 sets - 10 reps - 5 hold  - Squat with Chair and Counter Support  - 1 x daily - 7 x weekly - 2 sets - 10 reps - 5 hold   Access Code: [email or web address] URL: [email or web address] Date: 12/29/2023 Prepared by: Darryle Patten  Exercises - Seated Hamstring Curls with Resistance  - 1 x daily - 5-6 x weekly - 3-4 sets - 20-25 reps - Seated Hip Abduction with Resistance  - 1 x daily - 5-6 x weekly - 3-4 sets - 20-25 reps - Seated March  - 1 x daily - 5-6 x weekly - 3-4 sets - 20-25 reps   Access Code: [email or web address] URL: [email or web address] Date: 01/26/2024 Prepared by: Massie Dollar  Exercises - Supine Heel Slide  - 1 x daily - 7 x weekly - 3 sets - 10 reps -  Supine Straight Leg Raises  - 1 x daily - 7 x weekly - 3 sets - 10 reps - Hooklying Clamshell with Resistance  - 1 x daily - 7 x weekly - 3 sets - 10 reps - Supine Hip Flexion  - 1 x daily - 7 x weekly - 3 sets - 10 reps - Supine Single Leg Ankle Pumps  - 1 x daily - 7 x weekly - 3 sets - 10 reps - Supine Bridge  - 1 x daily - 7 x weekly - 3 sets - 10 reps   GOALS: Goals reviewed with patient? Yes  SHORT TERM GOALS: Target date:  02/09/2024   Patient will be independent in home exercise program to improve strength/mobility for better functional independence with ADLs. Baseline: initiated Goal status:MET    LONG TERM GOALS: Target date:  09/04/2024  Patient will perform sit to stand with min-mod assist from standard chair height in order to indicate improved lower extremity functional strength and improved ability to perform functional transfers and functional leg activities within her home.  Baseline: mod assist with +2 for safety.  4/2: mod assist 6/23: CGA from wheelchair  7/29: CGA from [facility] with RW  Goal status: MET   2.  Pt will improve LEFS by 10 points or more in order to indicate improved LE function and mobility.  Baseline: 50 4/2: 32 7/29: 31  08/29/24: 36 Goal status: progressing   3.  Patient will increase FOTO score to equal to or greater than  59   to demonstrate statistically significant improvement in mobility and quality of life.  Baseline: 54 Goal status: goal d/c'ed.   4.  Pt will require no more than min assist for transfer from bed to bedside commode Baseline: supervision assist with  slide board transfer. To mat table  Using walker for transfer to [facility] with mod assist for safety and  6/23: husband helps supervise and set up with CGA 7/29: able to transfer to and from [facility] with RW without assist from Husband  08/29/24: Pt is able to mobilize to the walker and from the bedroom to the living room (25') without any complication. Goal status: MET  5.  The pt will  demo ability to don socks and shoes indep or mod I for increased ease with dressing and improved independence Baseline: required max assist from husband  4/2: pt states that she is donning night socks. Is still requiring max assist for LLE and moderate assist for the RLE  6/23; husband requires max A for socks and shoes 7/29: reports that she attempted to put on socks and shoes, without success due to strength and ROM deficits in Bil hips; but was  08/29/24: Pt reports she has not been able to don/doff shoes consistently without assistance. Goal status: Progressing  6.  The pt will demo ability to complete 30 second STS test to indicate increased ease with transfers and BLE strength Baseline: 2 sit<>stand from [facility] with cushion in place and  +2 for safety.  4/2: 3 with mod assist and +2 to stabilize RW  6/23: 3 with CGA; +2 to stabilize RW without brace. With KAFOs on: 3.5 with CGA; +2 to stabilize  7/29: 4.5 with RW and CGA from arm chair with airex pad.  08/29/24: 5 with RW and CGA from arm chair with airex pad Goal status: progressing   7.  Patient will increase 10 meter walk test to >1.68m/s as to improve gait speed for better community ambulation and to reduce fall risk. Baseline: 5/23: 2 min 2 seconds with orthotics on; 1 min 7 seconds without orthotics with wheelchair follow and BrW.  7/29: Average: 46.79  sec 0.59m/s  with RW and WC follow , no Bracing 08/29/24: 45.01 sec; 0.22 m/s Goal status: NEW   ASSESSMENT:  CLINICAL IMPRESSION:  Pt has yet to achieve all goals set forth at initial evaluation, however due to the insurance coverage and lack of available visits at this time, pt will be discharged.  Pt intends on returning when eligible for increased visits if pt is to change health insurance.  Pt ultimately has made significant progress and was  encouraged to continue with current HEP and mobility at home in order to successfully improve overall independence.  Pt is formally discharged at  this time.       OBJECTIVE IMPAIRMENTS: Abnormal gait, decreased activity tolerance, decreased balance, decreased coordination, decreased endurance, decreased mobility, difficulty walking, decreased ROM, decreased strength, hypomobility, impaired sensation, improper body mechanics, postural dysfunction, and pain.   ACTIVITY LIMITATIONS: carrying, lifting, bending, standing, squatting, stairs, transfers, bed mobility, bathing, toileting, dressing, and locomotion level  PARTICIPATION LIMITATIONS: meal prep, cleaning, laundry, driving, shopping, community activity, and yard work  PERSONAL FACTORS: Fitness, Sex, Time since onset of injury/illness/exacerbation, and 3+ comorbidities: PMH per chart significant for arthritis, blood clotting disorder, DDD (lumbar), DM, headache, HTN, neuropathy, vertigo are also affecting patient's functional outcome.   REHAB POTENTIAL: Good  CLINICAL DECISION MAKING: Evolving/moderate complexity  EVALUATION COMPLEXITY: Moderate  PLAN:  PT FREQUENCY: 1-2x/week  PT DURATION: 12 weeks  PLANNED INTERVENTIONS: [id number]- PT Re-evaluation, 97110-Therapeutic exercises, 97530- Therapeutic activity, [id number]- Neuromuscular re-education, 97535- Self Care, [id number]- Manual therapy, [id number]- Gait training, [phone]- Orthotic Fit/training, [id number]- Canalith repositioning, [id number]- Electrical stimulation (manual), [id number]- Ultrasound, Patient/Family education, Balance training, Stair training, Taping, Dry Needling, Joint mobilization, Spinal mobilization, Vestibular training, DME instructions, Wheelchair mobility training, Cryotherapy, and Moist heat  PLAN FOR NEXT SESSION:   D/c at this time.    Fonda Simpers, PT, DPT Physical Therapist - [facility]  08/29/24, 4:26 PM

## 2024-09-05 ENCOUNTER — Other Ambulatory Visit: Payer: Self-pay | Admitting: Pharmacist

## 2024-09-05 ENCOUNTER — Ambulatory Visit

## 2024-09-05 NOTE — Progress Notes (Signed)
 09/05/2024 Name: Gabriella Marsh MRN: [id number] DOB: Mar 07, 1966  Chief Complaint  Patient presents with   Diabetes    Gabriella Marsh is a 58 y.o. year old female who presented for a telephone visit.   They were referred to the pharmacist by their PCP for assistance in managing diabetes.   Referral for DM control  ER 3 weeks ago for leg swelling and high BG's- found out Dexcom was not working  Subjective:  Care Team: Primary Care Provider: Donzella Lauraine SAILOR, DO ; Next Scheduled Visit: 10/24/24 Clinical Pharmacist: Aloysius Lewis, PharmD  Medication Access/Adherence  Current Pharmacy:  [facility] [phone] - [place],  - [address] AT [place] OF [address] [place] [address] Phone: [phone] Fax: [phone]  [facility] - [place], [place] - [address] [place] [address] Phone: [phone] Fax: [phone]   Patient reports affordability concerns with their medications: No  Patient reports access/transportation concerns to their pharmacy: No  Patient reports adherence concerns with their medications:  No     Diabetes:  Current medications: Trulicity  4.5mg  weekly, Semglee  66U at bedtime, Novolog  20U with meals, Metformin  1000mg  twice a day (max dose) Medications tried in the past: Glipizide  (stopped insulin ), Trulicity  1.5mg  weekly (2023 due to shortage)   Using Accu-chek meter; testing 0 times daily (needs to find) *Currently uses Dexcom G7  8/25 visit; 9/16 visit- was 7.5%   8/11 visit:    07/10/24 visit:  06/26/24 visit:   06/01/24 visit:   05/29/24 visit:    05/24/24 visit:   05/19/24 visit:    5/27 visit:   5/13 visit:    Patient denies hypoglycemic s/sx including dizziness, shakiness, sweating. Patient denies hyperglycemic symptoms including polyuria, polydipsia, polyphagia, nocturia, neuropathy, blurred vision.  Current meal patterns:   - Breakfast (5:30AM): Bowl of cheerios + cup of coffee  - Lunch (12-1PM): Mrs. Joselyn frozen foods prior, leftover now - Supper (5-6:30PM): Hot dogs, crockpot potato soup, eat out a good bit; goal of baked chicken + Mrs. Dash seasoning, rice or mac'n'cheese weekly - Snacks: Half an orange or banana - Drinks: Water, Splenda tea (half large tumbler daily) *Husband doesn't know how to cook- mostly microwavable items  Current physical activity: Can't walk- uses wheelchair  Current medication access support: [employer] Commercial   Objective:  Lab Results  Component Value Date   HGBA1C 8.2 (H) 07/24/2024    Lab Results  Component Value Date   CREATININE 0.66 07/24/2024   BUN 15 07/24/2024   NA 139 07/24/2024   K 5.1 07/24/2024   CL 97 07/24/2024   CO2 25 07/24/2024    Lab Results  Component Value Date   CHOL 135 07/24/2024   HDL 35 (L) 07/24/2024   LDLCALC 59 07/24/2024   TRIG 255 (H) 07/24/2024   CHOLHDL 3.9 07/24/2024    Medications Reviewed Today   Medications were not reviewed in this encounter       Assessment/Plan:   Diabetes: - Currently uncontrolled - Reviewed long term cardiovascular and renal outcomes of uncontrolled blood sugar - Reviewed goal A1c, goal fasting, and goal 2 hour post prandial glucose - Patient denies personal or family history of multiple endocrine neoplasia type 2, medullary thyroid  cancer; personal history of pancreatitis or gallbladder disease. - Recommend to check glucose continuously with Dexcom G7   Follow Up Plan:  - Follow-up on 09/26/24 for insulin  titration +  Dexcom review - CONTINUE Trulicity  to 4.5mg  weekly; NO changes today  - Had diarrhea with any Ozempic  doses higher than 0.25mg  weekly  - Reports appetite suppressed with 3mg  dose which is helpful - Continue Semglee  66U nightly  - Said they are having manufacturer issues with Semglee  pens currently, so continuing with vials for now - Continue Novolog  at 20U three times a  day - Was having insurance issues with Trulicity  coverage- planning to restart later today  - Sugars have been a little higher over the past week - Accidentally forgot Semglee  last night, so they looked higher today as well - Brief discussion about switching to [facility] Medicare Part D starting 12/1- will see how this affects med costs - Next A1c check on 11/4 with Dr. Donzella Aloysius Lewis, PharmD [place]  Phone Number: [phone]

## 2024-09-07 ENCOUNTER — Encounter: Payer: Self-pay | Admitting: Family Medicine

## 2024-09-07 ENCOUNTER — Ambulatory Visit: Payer: Self-pay | Admitting: Urology

## 2024-09-12 ENCOUNTER — Ambulatory Visit: Admitting: Physical Therapy

## 2024-09-13 ENCOUNTER — Other Ambulatory Visit: Payer: Self-pay | Admitting: Nurse Practitioner

## 2024-09-13 ENCOUNTER — Other Ambulatory Visit: Payer: Self-pay

## 2024-09-13 ENCOUNTER — Telehealth: Payer: Self-pay | Admitting: Family Medicine

## 2024-09-13 DIAGNOSIS — G2581 Restless legs syndrome: Secondary | ICD-10-CM

## 2024-09-13 NOTE — Telephone Encounter (Signed)
 Converted into a refill request

## 2024-09-13 NOTE — Telephone Encounter (Signed)
 [facility] faxed refill request for the following medications:   nortriptyline  (PAMELOR ) 50 MG capsule     Please advise.

## 2024-09-16 ENCOUNTER — Encounter: Payer: Self-pay | Admitting: Student in an Organized Health Care Education/Training Program

## 2024-09-16 ENCOUNTER — Encounter: Payer: Self-pay | Admitting: Family Medicine

## 2024-09-16 DIAGNOSIS — R600 Localized edema: Secondary | ICD-10-CM

## 2024-09-16 DIAGNOSIS — E1140 Type 2 diabetes mellitus with diabetic neuropathy, unspecified: Secondary | ICD-10-CM

## 2024-09-18 ENCOUNTER — Encounter: Payer: Self-pay | Admitting: Family Medicine

## 2024-09-18 ENCOUNTER — Encounter: Admitting: Nurse Practitioner

## 2024-09-18 MED ORDER — INSULIN GLARGINE-YFGN 100 UNIT/ML ~~LOC~~ SOPN: PEN_INJECTOR | SUBCUTANEOUS | 2 refills | Status: DC

## 2024-09-18 MED ORDER — FUROSEMIDE 20 MG PO TABS: 10.0000 mg | ORAL_TABLET | Freq: Every day | ORAL | 0 refills | Status: DC | PRN

## 2024-09-18 MED ORDER — NORTRIPTYLINE HCL 50 MG PO CAPS: 50.0000 mg | ORAL_CAPSULE | Freq: Every day | ORAL | 3 refills | Status: DC

## 2024-09-20 ENCOUNTER — Other Ambulatory Visit: Payer: Self-pay

## 2024-09-21 MED ORDER — INSULIN GLARGINE-YFGN 100 UNIT/ML ~~LOC~~ SOPN: PEN_INJECTOR | SUBCUTANEOUS | 2 refills | Status: DC

## 2024-09-21 MED ORDER — FUROSEMIDE 20 MG PO TABS: 10.0000 mg | ORAL_TABLET | Freq: Every day | ORAL | 0 refills | Status: DC | PRN

## 2024-09-21 NOTE — Addendum Note (Signed)
 Addended by: Finnlee Silvernail E on: 09/21/2024 01:42 PM   Modules accepted: Orders

## 2024-09-23 DIAGNOSIS — E1165 Type 2 diabetes mellitus with hyperglycemia: Secondary | ICD-10-CM | POA: Diagnosis not present

## 2024-09-26 ENCOUNTER — Other Ambulatory Visit: Payer: Self-pay | Admitting: Pharmacist

## 2024-09-26 DIAGNOSIS — E039 Hypothyroidism, unspecified: Secondary | ICD-10-CM

## 2024-09-26 MED ORDER — LEVOTHYROXINE SODIUM 112 MCG PO TABS: ORAL_TABLET | ORAL | 1 refills | Status: DC

## 2024-09-26 NOTE — Progress Notes (Signed)
 09/26/2024 Name: Ellah Otte MRN: [id number] DOB: Apr 05, 1966  Chief Complaint  Patient presents with   Diabetes    Kajuana Shareef is a 58 y.o. year old female who presented for a telephone visit.   They were referred to the pharmacist by their PCP for assistance in managing diabetes.   Referral for DM control  ER 3 weeks ago for leg swelling and high BG's- found out Dexcom was not working  Subjective:  Care Team: Primary Care Provider: Donzella Lauraine SAILOR, DO ; Next Scheduled Visit: 10/24/24 Clinical Pharmacist: Aloysius Lewis, PharmD  Medication Access/Adherence  Current Pharmacy:  [facility] [phone] - [place], [place] - [address] AT [facility] [address] [place] [address] Phone: [phone] Fax: [phone]  [facility] - [place], [place] - [address] [place] [address] Phone: [phone] Fax: [phone]  [facility] [address]), [place] - [address] [place]) [place] [address] Phone: [phone] Fax: [phone]   Patient reports affordability concerns with their medications: No  Patient reports access/transportation concerns to their pharmacy: No  Patient reports adherence concerns with their medications:  No     Diabetes:  Current medications: Trulicity  4.5mg  weekly, Semglee  66U at bedtime, Novolog  20U with meals, Metformin  1000mg  twice a day (max dose) Medications tried in the past: Glipizide  (stopped insulin ), Trulicity  1.5mg  weekly (2023 due to shortage)   Using Accu-chek meter; testing 0 times daily (needs to find) *Currently uses Dexcom G7  10/7 visit:   8/25 visit; 9/16 visit- was 7.5%   8/11 visit:    07/10/24 visit:  06/26/24 visit:   06/01/24 visit:   05/29/24 visit:    05/24/24 visit:   05/19/24 visit:    5/27 visit:   5/13 visit:    Patient denies hypoglycemic s/sx  including dizziness, shakiness, sweating. Patient denies hyperglycemic symptoms including polyuria, polydipsia, polyphagia, nocturia, neuropathy, blurred vision.  Current meal patterns:  - Breakfast (5:30AM): Bowl of cheerios + cup of coffee  - Lunch (12-1PM): Mrs. Joselyn frozen foods prior, leftover now - Supper (5-6:30PM): Hot dogs, crockpot potato soup, eat out a good bit; goal of baked chicken + Mrs. Dash seasoning, rice or mac'n'cheese weekly - Snacks: Half an orange or banana - Drinks: Water, Splenda tea (half large tumbler daily) *Husband doesn't know how to cook- mostly microwavable items  Current physical activity: Can't walk- uses wheelchair  Current medication access support: [employer]   Objective:  Lab Results  Component Value Date   HGBA1C 8.2 (H) 07/24/2024    Lab Results  Component Value Date   CREATININE 0.66 07/24/2024   BUN 15 07/24/2024   NA 139 07/24/2024   K 5.1 07/24/2024   CL 97 07/24/2024   CO2 25 07/24/2024    Lab Results  Component Value Date   CHOL 135 07/24/2024   HDL 35 (L) 07/24/2024   LDLCALC 59 07/24/2024   TRIG 255 (H) 07/24/2024   CHOLHDL 3.9 07/24/2024    Medications Reviewed Today   Medications were not reviewed in this encounter       Assessment/Plan:   Diabetes: - Currently uncontrolled - Reviewed long term cardiovascular and renal outcomes of uncontrolled blood sugar - Reviewed goal A1c, goal fasting, and goal 2 hour post prandial glucose - Patient denies personal or family history of multiple endocrine neoplasia type 2, medullary thyroid  cancer;  personal history of pancreatitis or gallbladder disease. - Recommend to check glucose continuously with Dexcom G7   Follow Up Plan:  - Set task to see how Dexcom looks after visit with Dr. Donzella on 11/4 - CONTINUE Trulicity  to 4.5mg  weekly; NO changes today  - Had diarrhea with any Ozempic  doses higher than 0.25mg  weekly  - Reports appetite suppressed with 3mg   dose which is helpful - Continue Semglee  66U nightly  - Still on national backorder issue - Continue Novolog  at 20U three times a day - Was having insurance issues with Trulicity  coverage- planning to restart later today - Brief discussion about switching to [facility] Medicare Part D starting 12/1- will see how this affects med costs - Next A1c check on 11/4 with Dr. Donzella - Also needs updated script with [facility] to get both doses- sending updated refill explaining regimen - Leg swelling is doing much better along with the compression stockings   Aloysius Lewis, PharmD [facility]  Phone Number: [phone]

## 2024-09-29 ENCOUNTER — Telehealth: Payer: Self-pay | Admitting: Family Medicine

## 2024-09-29 NOTE — Telephone Encounter (Signed)
 Pt has enough refills til next year. Last refilled sent 09/09/24

## 2024-09-29 NOTE — Telephone Encounter (Signed)
 [facility] faxed refill request for the following medications:   nortriptyline  (PAMELOR ) 50 MG capsule     Please advise.

## 2024-10-03 ENCOUNTER — Encounter: Payer: Self-pay | Admitting: Nurse Practitioner

## 2024-10-03 ENCOUNTER — Ambulatory Visit: Attending: Nurse Practitioner | Admitting: Nurse Practitioner

## 2024-10-03 DIAGNOSIS — M545 Low back pain, unspecified: Secondary | ICD-10-CM | POA: Diagnosis not present

## 2024-10-03 DIAGNOSIS — G8929 Other chronic pain: Secondary | ICD-10-CM | POA: Diagnosis not present

## 2024-10-03 DIAGNOSIS — M4317 Spondylolisthesis, lumbosacral region: Secondary | ICD-10-CM

## 2024-10-03 DIAGNOSIS — M25512 Pain in left shoulder: Secondary | ICD-10-CM | POA: Insufficient documentation

## 2024-10-03 DIAGNOSIS — M79641 Pain in right hand: Secondary | ICD-10-CM | POA: Insufficient documentation

## 2024-10-03 DIAGNOSIS — Z79891 Long term (current) use of opiate analgesic: Secondary | ICD-10-CM | POA: Diagnosis not present

## 2024-10-03 DIAGNOSIS — G894 Chronic pain syndrome: Secondary | ICD-10-CM | POA: Diagnosis not present

## 2024-10-03 DIAGNOSIS — M4310 Spondylolisthesis, site unspecified: Secondary | ICD-10-CM | POA: Insufficient documentation

## 2024-10-03 DIAGNOSIS — M79642 Pain in left hand: Secondary | ICD-10-CM | POA: Diagnosis not present

## 2024-10-03 DIAGNOSIS — M170 Bilateral primary osteoarthritis of knee: Secondary | ICD-10-CM | POA: Diagnosis not present

## 2024-10-03 MED ORDER — OXYCODONE-ACETAMINOPHEN 5-325 MG PO TABS: 1.0000 | ORAL_TABLET | Freq: Three times a day (TID) | ORAL | 0 refills | Status: DC

## 2024-10-03 NOTE — Progress Notes (Signed)
 PROVIDER NOTE: Interpretation of information contained herein should be left to medically-trained personnel. Specific patient instructions are provided elsewhere under Patient Instructions section of medical record. This document was created in part using AI and STT-dictation technology, any transcriptional errors that may result from this process are unintentional.  Patient: Gabriella Marsh  Service: E/M   PCP: Donzella Lauraine SAILOR, DO  DOB: [birth year]  DOS: 10/03/2024  Provider: Emmy Gabriella Blanch, Marsh  MRN: [id number]  Delivery: Face-to-face  Specialty: Interventional Pain Management  Type: Established Patient  Setting: Ambulatory outpatient facility  Specialty designation: 09  Referring Prov.: Donzella Lauraine SAILOR, DO  Location: Outpatient office facility       History of present illness (HPI) [name], a 58 y.o. year old female, is here today because of her lower back pain. [name] primary complain today is Back Pain (lower)  Pertinent problems: [name] has Diabetic peripheral neuropathy (HCC); RLS (restless leg syndrome); Chronic low back pain (2ry area of pain) (Bilateral) (L>R) w/o sciatica; DDD (degenerative disc disease), lumbosacral; Disorder of peripheral nervous system; Disorder of skeletal system; and Chronic pain syndrome on their pertinent problem list.  Pain Assessment: Severity of Chronic pain is reported as a 4 /10. Location: Back Lower/denies. Onset: More than a month ago. Quality: Dull. Timing: Intermittent. Modifying factor(s): laying on side. Vitals:  height is 5' 10 (1.778 m) and weight is 230 lb (104.3 kg). Her temperature is 97.3 F (36.3 C) (abnormal). Her blood pressure is 125/73 and her pulse is 78. Her oxygen saturation is 95%.  BMI: Estimated body mass index is 33 kg/m as calculated from the following:   Height as of this encounter: 5' 10 (1.778 m).   Weight as of this encounter: 230 lb (104.3 kg).  Last encounter: 06/21/2024. Last procedure: Visit  date not found.  Reason for encounter: medication management. No change in medical history since last visit.  Patient's pain is at baseline.  Patient continues multimodal pain regimen as prescribed.  States that it provides pain relief and improvement in functional status.  The patient continues struggling with low back pain, however, current pain medication regimen provides pain relief and manage her functional mobility.  Discussed the use of AI scribe software for clinical note transcription with the patient, who gave verbal consent to proceed.  History of Present Illness   Gabriella Marsh is a 58 year old female with chronic lower back pain who presents for medication management and discussion of future treatment options.  She experiences intermittent lower back pain, exacerbated by physical activity such as walking more than fifty yards. The pain is described as a pressure sensation in the lower back, which she manages by stretching or lying down. She has not attended physical therapy for over a month due to insurance limitations but continues to walk at home as much as possible. She uses a walker for mobility and notes that the more she walks, the better she feels overall, although walking more than fifty yards can exacerbate her lower back pain.  She is currently taking Percocet 5-325 mg every eight hours for pain management. She tries to avoid frequent use of pain medication and instead relies on stretching exercises. She also applies Voltaren gel on her leg, which is particularly helpful in cold weather due to discomfort from a metal plate in her knee following surgery.  She mentions a small sore on her finger, possibly caused by her ring. The sore has been present for about a  week, is painless, and has not blistered.  Based on the assessment of her finger, I suspect that she may have a been bitten by something without noticing it at the time.  She mentioned that she has a dog that she  frequently bats, and it is possible that he might have picked up something that caused the bite or irritation.  She is diabetic, which may affect the healing process.  The patient was advised to keep watch the symptoms for any infection.   She experiences occasional constipation, attributed to her medication regimen, and manages it with stool softeners taken every other day or every two days. This regimen has helped her maintain regular bowel movements.       Pharmacotherapy Assessment   Oxycodone -acetaminophen  (Percocet) 5-32/3255 MG tablet every 8 hours as needed for pain. MME=20 Monitoring: [place] PMP: PDMP reviewed during this encounter.       Pharmacotherapy: No side-effects or adverse reactions reported. Compliance: No problems identified. Effectiveness: Clinically acceptable.  Margrette Nathanel PARAS, RN  10/03/2024  1:41 PM  Sign when Signing Visit Safety precautions to be maintained throughout the outpatient stay will include: orient to surroundings, keep bed in low position, maintain call bell within reach at all times, provide assistance with transfer out of bed and ambulation.   Nursing Pain Medication Assessment:  Safety precautions to be maintained throughout the outpatient stay will include: orient to surroundings, keep bed in low position, maintain call bell within reach at all times, provide assistance with transfer out of bed and ambulation.  Medication Inspection Compliance: Pill count conducted under aseptic conditions, in front of the patient. Neither the pills nor the bottle was removed from the patient's sight at any time. Once count was completed pills were immediately returned to the patient in their original bottle.  Medication: Oxycodone /APAP Pill/Patch Count: 78 of 90 pills/patches remain Pill/Patch Appearance: Markings consistent with prescribed medication Bottle Appearance: Standard pharmacy container. Clearly labeled. Filled Date: 60 / 6 / 2025 Last Medication intake:   Today    UDS:  Summary  Date Value Ref Range Status  06/21/2024 FINAL  Final    Comment:    ==================================================================== [facility] ([facility]) ==================================================================== Test                             Result       Flag       Units  Drug Present and Declared for Prescription Verification   Oxycodone                       858          EXPECTED   ng/mg creat   Oxymorphone                    768          EXPECTED   ng/mg creat   Noroxycodone                   1293         EXPECTED   ng/mg creat   Noroxymorphone                 402          EXPECTED   ng/mg creat    Sources of oxycodone  are scheduled prescription medications.    Oxymorphone, noroxycodone, and noroxymorphone are expected    metabolites of oxycodone . Oxymorphone is  also available as a    scheduled prescription medication.  ==================================================================== Test                      Result    Flag   Units      Ref Range   Creatinine              85               mg/dL      >=79 ==================================================================== Declared Medications:  The flagging and interpretation on this report are based on the  following declared medications.  Unexpected results may arise from  inaccuracies in the declared medications.   **Note: The testing scope of this panel includes these medications:   Oxycodone  (Percocet)   **Note: The testing scope of this panel does not include the  following reported medications:   Acetaminophen  (Tylenol )  Acetaminophen  (Percocet)  Atorvastatin  (Lipitor)  Cyclobenzaprine  (Flexeril )  Dulaglutide  (Trulicity )  Fluticasone  (Flonase )  Furosemide  (Lasix )  Gabapentin  (Neurontin )  Insulin  (NovoLog )  Levothyroxine  (Synthroid )  Lisinopril  (Zestril )  Melatonin  Metformin  (Glucophage )  Nortriptyline  (Pamelor )  Nystatin  (Mycolog-II )  Omeprazole   (Prilosec)  Trazodone  (Desyrel )  Triamcinolone  (Mycolog-II )  Vitamin D2 (Drisdol ) ==================================================================== For clinical consultation, please call [phone]. ====================================================================     No results found for: CBDTHCR No results found for: D8THCCBX No results found for: D9THCCBX  ROS  Constitutional: Denies any fever or chills Gastrointestinal: No reported hemesis, hematochezia, vomiting, or acute GI distress Musculoskeletal: Lower back pain Neurological: No reported episodes of acute onset apraxia, aphasia, dysarthria, agnosia, amnesia, paralysis, loss of coordination, or loss of consciousness  Medication Review  Dulaglutide , INSULIN  SYRINGE 1CC/31GX5/16, Insulin  Pen Needle, Medical Compression Stockings, Melatonin, Vitamin D  (Ergocalciferol ), acetaminophen , atorvastatin , cyclobenzaprine , fluticasone , furosemide , gabapentin , glucose blood, insulin  aspart, insulin  glargine-yfgn, levothyroxine , lisinopril , metFORMIN , nortriptyline , nystatin -triamcinolone  ointment, omeprazole , oxyCODONE -acetaminophen , senna, and traZODone   History Review  Allergy: Ms. Stokes is allergic to celecoxib, pregabalin, buspar  [buspirone ], citalopram , and other. Drug: Ms. Larsson  reports no history of drug use. Alcohol:  reports no history of alcohol use. Tobacco:  reports that she quit smoking about 12 years ago. Her smoking use included cigarettes. She has never used smokeless tobacco. Social: Ms. Dralle  reports that she quit smoking about 12 years ago. Her smoking use included cigarettes. She has never used smokeless tobacco. She reports that she does not drink alcohol and does not use drugs. Medical:  has a past medical history of Arthritis, Blood clotting disorder, Degenerative disc disease, lumbar, Diabetes mellitus without complication (HCC), GERD (gastroesophageal reflux disease), Headache, Hyperlipidemia,  Hypertension, Neuropathy, and Vertigo. Surgical: Ms. Lacko  has a past surgical history that includes Cesarean section; Shoulder surgery (Left, 12/26/014); Abdominal hysterectomy; Tonsillectomy and adenoidectomy; Uterine fibroid surgery; Ovarian cyst surgery; Cholecystectomy; left arm frature (10/12/2020); Thyroidectomy (N/A, 03/28/2021); Parathyroidectomy (03/28/2021); Amputation toe (Right, 07/24/2021); Colonoscopy with propofol  (N/A, 02/20/2022); and right femur surgery (Right). Family: family history includes Congestive Heart Failure in her father; Diabetes in her father; Healthy in her brother; Heart disease in her father; Hyperlipidemia in her father; Hypertension in her father; Irritable bowel syndrome in her mother; Osteoporosis in her maternal grandmother and mother.  Laboratory Chemistry Profile   Renal Lab Results  Component Value Date   BUN 15 07/24/2024   CREATININE 0.66 07/24/2024   BCR 23 07/24/2024   GFR 111.18 06/05/2015   GFRAA 92 01/03/2021   GFRNONAA >60 04/04/2024    Hepatic Lab Results  Component Value Date   AST  22 07/24/2024   ALT 27 07/24/2024   ALBUMIN 4.2 07/24/2024   ALKPHOS 77 07/24/2024   LIPASE 29 04/04/2024    Electrolytes Lab Results  Component Value Date   NA 139 07/24/2024   K 5.1 07/24/2024   CL 97 07/24/2024   CALCIUM  8.9 07/24/2024   MG 1.8 01/09/2023    Bone Lab Results  Component Value Date   VD25OH 13.8 (L) 04/21/2024   25OHVITD1 14 (L) 11/12/2021   25OHVITD2 <1.0 11/12/2021   25OHVITD3 13 11/12/2021    Inflammation (CRP: Acute Phase) (ESR: Chronic Phase) Lab Results  Component Value Date   CRP 4 11/12/2021   ESRSEDRATE 39 11/12/2021   LATICACIDVEN 2.9 (HH) 01/07/2023         Note: Above Lab results reviewed.  Recent Imaging Review  CT ABDOMEN PELVIS W CONTRAST CLINICAL DATA:  Right lower quadrant abdominal pain.  EXAM: CT ABDOMEN AND PELVIS WITH CONTRAST  TECHNIQUE: Multidetector CT imaging of the abdomen and  pelvis was performed using the standard protocol following bolus administration of intravenous contrast.  RADIATION DOSE REDUCTION: This exam was performed according to the departmental dose-optimization program which includes automated exposure control, adjustment of the mA and/or kV according to patient size and/or use of iterative reconstruction technique.  CONTRAST:  OMNIPAQUE  IOHEXOL  300 MG/ML  SOLN  COMPARISON:  None Available.  FINDINGS: Lower chest: No acute abnormality.  Hepatobiliary: The liver is mildly enlarged. There is a hypodensity in the inferior right lobe of the liver which is too small to characterize measuring 7 mm image 2/37. Gallbladder surgically absent. There is no biliary ductal dilatation.  Pancreas: Unremarkable. No pancreatic ductal dilatation or surrounding inflammatory changes.  Spleen: Normal in size without focal abnormality.  Adrenals/Urinary Tract: Bladder is distended. The bladder otherwise appears within normal limits. The bilateral kidneys and adrenal glands are within normal limits.  Stomach/Bowel: Stomach is within normal limits. Appendix appears normal. No evidence of bowel wall thickening, distention, or inflammatory changes.  Vascular/Lymphatic: Aortic atherosclerosis. No enlarged abdominal or pelvic lymph nodes.  Reproductive: Uterus and bilateral adnexa are unremarkable.  Other: There is some subcutaneous stranding in the lower anterior abdominal wall. No fluid collection. No significant abdominal wall hernia.  Musculoskeletal: There is mild chronic appearing compression deformity of L1. There are degenerative changes at L5-S1.  IMPRESSION: 1. No acute localizing process in the abdomen or pelvis. 2. Distended bladder. 3. Mild hepatomegaly. 4. Subcutaneous stranding in the lower anterior abdominal wall, nonspecific. 5. Aortic atherosclerosis.  Electronically Signed   By: Greig Pique M.D.   On: 04/04/2024  18:13 US  Venous Img Lower Unilateral Right CLINICAL DATA:  Right lower extremity pain and edema  EXAM: RIGHT LOWER EXTREMITY VENOUS DOPPLER ULTRASOUND  TECHNIQUE: Gray-scale sonography with graded compression, as well as color Doppler and duplex ultrasound were performed to evaluate the lower extremity deep venous systems from the level of the common femoral vein and including the common femoral, femoral, profunda femoral, popliteal and calf veins including the posterior tibial, peroneal and gastrocnemius veins when visible. Spectral Doppler was utilized to evaluate flow at rest and with distal augmentation maneuvers in the common femoral, femoral and popliteal veins.  COMPARISON:  None Available.  FINDINGS: Contralateral Common Femoral Vein: Respiratory phasicity is normal and symmetric with the symptomatic side. No evidence of thrombus. Normal compressibility.  Common Femoral Vein: No evidence of thrombus. Normal compressibility, respiratory phasicity and response to augmentation.  Saphenofemoral Junction: No evidence of thrombus. Normal compressibility and flow on  color Doppler imaging.  Profunda Femoral Vein: No evidence of thrombus. Normal compressibility and flow on color Doppler imaging.  Femoral Vein: No evidence of thrombus. Normal compressibility, respiratory phasicity and response to augmentation.  Popliteal Vein: No evidence of thrombus. Normal compressibility, respiratory phasicity and response to augmentation.  Calf Veins: No evidence of thrombus. Normal compressibility and flow on color Doppler imaging.  IMPRESSION: No evidence of deep venous thrombosis.  Electronically Signed   By: CHRISTELLA.  Shick M.D.   On: 04/04/2024 14:51 Note: Reviewed        Physical Exam  Vitals: BP 125/73   Pulse 78   Temp (!) 97.3 F (36.3 C)   Ht 5' 10 (1.778 m)   Wt 230 lb (104.3 kg)   SpO2 95%   BMI 33.00 kg/m  BMI: Estimated body mass index is 33 kg/m as calculated  from the following:   Height as of this encounter: 5' 10 (1.778 m).   Weight as of this encounter: 230 lb (104.3 kg). Ideal: Ideal body weight: 68.5 kg (151 lb 0.2 oz) Adjusted ideal body weight: 82.8 kg (182 lb 9.7 oz) General appearance: Well nourished, well developed, and well hydrated. In no apparent acute distress Mental status: Alert, oriented x 3 (person, place, & time)       Respiratory: No evidence of acute respiratory distress Eyes: PERLA  Musculoskeletal: + LBP Assessment   Diagnosis Status  1. Chronic low back pain (2ry area of Pain) (Bilateral) (L>R) w/o sciatica   2. Chronic pain syndrome   3. Osteoarthritis of knees (Bilateral)   4. Chronic use of opiate for therapeutic purpose   5. Chronic shoulder pain (4th area of Pain) (Left)   6.  Grade 1 Retrolisthesis (9mm) of L5 over S1   7. Chronic hand pain (1ry area of Pain) (Bilateral) (L>R)   8. Tricompartment osteoarthritis of knees (Bilateral)    Controlled Controlled Controlled   Updated Problems: Problem  Chronic Use of Opiate for Therapeutic Purpose    Plan of Care  Problem-specific:  Assessment and Plan    Chronic low back pain Intermittent pain exacerbated by walking, relieved by stretching and laying down. Limited physical therapy due to insurance. Discussed potential lumbar epidural or facet block post-Medicare enrollment. - Discuss lumbar epidural or lumbar facet block in January or February after Medicare enrollment. - Schedule back injections after January 03, 2025.  Chronic right knee pain, status post fracture and surgery Pain exacerbated by cold weather, likely due to previous fracture and surgical intervention. Voltaren gel provides relief.  Chronic pain syndrome Managed with Percocet 5-325 mg every eight hours. No significant adverse reactions, occasional constipation. Pain managed with stretching and limited medication use. - Continue Percocet 5-325 mg every eight hours as needed. - Discuss  Qutenza  treatment in January or February after Medicare enrollment.  Patient's pain is well-controlled with oxycodone , will continue on current medication regimen.  Prescribing drug monitoring (PMP) reviewed, findings consistent with the use of prescribed medication and no evidence of narcotic misuse or abuse.  Urine drug screening (UDS) up to date and consistent with the use of prescribed medication.  Schedule follow-up in 90 days for medication management.  The patient was advised to continue with stretching and walking.   Constipation secondary to opioid use Occasional constipation managed with stool softeners, maintaining regular bowel movements. - Continue stool softeners every other day or every two days as needed.  Small sore on finger, likely insect bite, monitoring Small sore likely due to insect  bite, present for about a week. Not painful, blistered, or red. Monitoring for infection. - Monitor sore for signs of infection. - Consult primary care provider if signs of infection develop.       Ms. Jhanvi Drakeford has a current medication list which includes the following long-term medication(s): atorvastatin , medical compression stockings, fluticasone , furosemide , gabapentin , insulin  aspart, insulin  pen needle, levothyroxine , levothyroxine , lisinopril , metformin , nortriptyline , omeprazole , trazodone , [START ON 10/25/2024] oxycodone -acetaminophen , [START ON 11/24/2024] oxycodone -acetaminophen , and [START ON 12/24/2024] oxycodone -acetaminophen .  Pharmacotherapy (Medications Ordered): Meds ordered this encounter  Medications   oxyCODONE -acetaminophen  (PERCOCET) 5-325 MG tablet    Sig: Take 1 tablet by mouth every 8 (eight) hours. Must last 30 days.    Dispense:  90 tablet    Refill:  0    DO NOT: delete (not duplicate); no partial-fill (will deny script to complete), no refill request (F/U required). DISPENSE: 1 day early if closed on fill date. WARN: No CNS-depressants within 8 hrs of  med.   oxyCODONE -acetaminophen  (PERCOCET) 5-325 MG tablet    Sig: Take 1 tablet by mouth every 8 (eight) hours. Must last 30 days.    Dispense:  90 tablet    Refill:  0    DO NOT: delete (not duplicate); no partial-fill (will deny script to complete), no refill request (F/U required). DISPENSE: 1 day early if closed on fill date. WARN: No CNS-depressants within 8 hrs of med.   oxyCODONE -acetaminophen  (PERCOCET) 5-325 MG tablet    Sig: Take 1 tablet by mouth every 8 (eight) hours. Must last 30 days.    Dispense:  90 tablet    Refill:  0    DO NOT: delete (not duplicate); no partial-fill (will deny script to complete), no refill request (F/U required). DISPENSE: 1 day early if closed on fill date. WARN: No CNS-depressants within 8 hrs of med.   Orders:  No orders of the defined types were placed in this encounter.       Return in about 3 months (around 01/03/2025) for (F2F), (MM), Emmy Blanch Marsh.    Recent Visits Date Type Provider Dept  08/03/24 Office Visit Marcelino Nurse, MD [facility]  Showing recent visits within past 90 days and meeting all other requirements Today's Visits Date Type Provider Dept  10/03/24 Office Visit Whitt Auletta K, Marsh [facility]  Showing today's visits and meeting all other requirements Future Appointments Date Type Provider Dept  12/26/24 Appointment Lenay Lovejoy K, Marsh [facility]  Showing future appointments within next 90 days and meeting all other requirements  I discussed the assessment and treatment plan with the patient. The patient was provided an opportunity to ask questions and all were answered. The patient agreed with the plan and demonstrated an understanding of the instructions.  Patient advised to call back or seek an in-person evaluation if the symptoms or condition worsens.  I personally spent a total of 30 minutes in the care of the patient today including preparing to see the patient, getting/reviewing  separately obtained history, performing a medically appropriate exam/evaluation, counseling and educating, placing orders, referring and communicating with other health care professionals, documenting clinical information in the EHR, independently interpreting results, communicating results, and coordinating care.   Note by: Jarryd Gratz K Raimundo Corbit, Marsh (TTS and AI technology used. I apologize for any typographical errors that were not detected and corrected.) Date: 10/03/2024; Time: 2:22 PM

## 2024-10-03 NOTE — Progress Notes (Signed)
 Safety precautions to be maintained throughout the outpatient stay will include: orient to surroundings, keep bed in low position, maintain call bell within reach at all times, provide assistance with transfer out of bed and ambulation.   Nursing Pain Medication Assessment:  Safety precautions to be maintained throughout the outpatient stay will include: orient to surroundings, keep bed in low position, maintain call bell within reach at all times, provide assistance with transfer out of bed and ambulation.  Medication Inspection Compliance: Pill count conducted under aseptic conditions, in front of the patient. Neither the pills nor the bottle was removed from the patient's sight at any time. Once count was completed pills were immediately returned to the patient in their original bottle.  Medication: Oxycodone /APAP Pill/Patch Count: 78 of 90 pills/patches remain Pill/Patch Appearance: Markings consistent with prescribed medication Bottle Appearance: Standard pharmacy container. Clearly labeled. Filled Date: 57 / 6 / 2025 Last Medication intake:  Today

## 2024-10-06 ENCOUNTER — Telehealth: Payer: Self-pay | Admitting: Family Medicine

## 2024-10-06 NOTE — Telephone Encounter (Signed)
 See other phone encounter.

## 2024-10-06 NOTE — Telephone Encounter (Signed)
 [facility] faxed refill request for the following medications:  Trulicity  4.5mg /0.31ml pen   Ultra-fine pen needle 31g 5mm   Vitamin D , Ergocalciferol , (DRISDOL ) 1.25 MG (50000 UNIT) CAPS capsule    Please advise.

## 2024-10-06 NOTE — Telephone Encounter (Signed)
 Called patient and she advised that this will start in December when she goes on medicare- not currently in need of refills

## 2024-10-06 NOTE — Telephone Encounter (Signed)
 [facility] faxed refill request for the following medications:   furosemide  (LASIX ) 20 MG tablet   insulin  glargine-yfgn (SEMGLEE ) 100 UNIT/ML Pen   lisinopril  (ZESTRIL ) 2.5 MG tablet   metFORMIN  (GLUCOPHAGE ) 500 MG tablet   nortriptyline  (PAMELOR ) 50 MG capsule   insulin  aspart (NOVOLOG ) 100 UNIT/ML FlexPen   omeprazole  (PRILOSEC) 40 MG capsule   insulin  glargine-yfgn (SEMGLEE ) 100 UNIT/ML Pen   levothyroxine  (SYNTHROID ) 112 MCG tablet   levothyroxine  (SYNTHROID ) 125 MCG tablet traZODone  (DESYREL ) 100 MG tablet     Please advise.

## 2024-10-16 ENCOUNTER — Telehealth: Payer: Self-pay | Admitting: Family Medicine

## 2024-10-16 NOTE — Telephone Encounter (Signed)
 [facility] faxed refill request for the following medications:  atorvastatin  (LIPITOR) 20 MG tablet   cyclobenzaprine  (FLEXERIL ) 5 MG tablet    Please advise.

## 2024-10-17 ENCOUNTER — Other Ambulatory Visit: Payer: Self-pay

## 2024-10-17 DIAGNOSIS — M62838 Other muscle spasm: Secondary | ICD-10-CM

## 2024-10-17 MED ORDER — ATORVASTATIN CALCIUM 20 MG PO TABS: ORAL_TABLET | ORAL | 3 refills | Status: AC

## 2024-10-17 MED ORDER — CYCLOBENZAPRINE HCL 5 MG PO TABS: 5.0000 mg | ORAL_TABLET | Freq: Three times a day (TID) | ORAL | 3 refills | Status: DC | PRN

## 2024-10-23 ENCOUNTER — Telehealth: Payer: Self-pay | Admitting: Family Medicine

## 2024-10-23 ENCOUNTER — Other Ambulatory Visit: Payer: Self-pay

## 2024-10-23 NOTE — Telephone Encounter (Signed)
[facility] faxed refill request for the following medications:  metFORMIN (GLUCOPHAGE) 500 MG tablet   Please advise.

## 2024-10-23 NOTE — Telephone Encounter (Signed)
 Converted to refill and being denied. Patient had requested use of a new pharmacy

## 2024-10-24 ENCOUNTER — Telehealth: Admitting: Student in an Organized Health Care Education/Training Program

## 2024-10-24 ENCOUNTER — Ambulatory Visit (INDEPENDENT_AMBULATORY_CARE_PROVIDER_SITE_OTHER): Admitting: Family Medicine

## 2024-10-24 ENCOUNTER — Other Ambulatory Visit: Payer: Self-pay | Admitting: Family Medicine

## 2024-10-24 ENCOUNTER — Encounter: Payer: Self-pay | Admitting: Family Medicine

## 2024-10-24 VITALS — BP 153/90 | HR 86 | Temp 97.9°F | Ht 70.0 in | Wt 256.7 lb

## 2024-10-24 DIAGNOSIS — G629 Polyneuropathy, unspecified: Secondary | ICD-10-CM

## 2024-10-24 DIAGNOSIS — Z23 Encounter for immunization: Secondary | ICD-10-CM

## 2024-10-24 DIAGNOSIS — E559 Vitamin D deficiency, unspecified: Secondary | ICD-10-CM

## 2024-10-24 DIAGNOSIS — E039 Hypothyroidism, unspecified: Secondary | ICD-10-CM

## 2024-10-24 DIAGNOSIS — R600 Localized edema: Secondary | ICD-10-CM | POA: Diagnosis not present

## 2024-10-24 DIAGNOSIS — E785 Hyperlipidemia, unspecified: Secondary | ICD-10-CM

## 2024-10-24 DIAGNOSIS — E1169 Type 2 diabetes mellitus with other specified complication: Secondary | ICD-10-CM

## 2024-10-24 DIAGNOSIS — Z794 Long term (current) use of insulin: Secondary | ICD-10-CM

## 2024-10-24 DIAGNOSIS — E1140 Type 2 diabetes mellitus with diabetic neuropathy, unspecified: Secondary | ICD-10-CM

## 2024-10-24 DIAGNOSIS — E1159 Type 2 diabetes mellitus with other circulatory complications: Secondary | ICD-10-CM

## 2024-10-24 DIAGNOSIS — I152 Hypertension secondary to endocrine disorders: Secondary | ICD-10-CM

## 2024-10-24 MED ORDER — METFORMIN HCL 500 MG PO TABS: 1000.0000 mg | ORAL_TABLET | Freq: Two times a day (BID) | ORAL | 3 refills | Status: DC

## 2024-10-24 MED ORDER — INSULIN ASPART 100 UNIT/ML FLEXPEN: PEN_INJECTOR | SUBCUTANEOUS | 1 refills | Status: AC

## 2024-10-24 MED ORDER — VITAMIN D (ERGOCALCIFEROL) 1.25 MG (50000 UNIT) PO CAPS: 50000.0000 [IU] | ORAL_CAPSULE | ORAL | 1 refills | Status: DC

## 2024-10-24 MED ORDER — DULAGLUTIDE 4.5 MG/0.5ML ~~LOC~~ SOAJ: 4.5000 mg | SUBCUTANEOUS | 0 refills | Status: DC

## 2024-10-24 NOTE — Telephone Encounter (Signed)
 Patient is getting refill while in the office today.

## 2024-10-24 NOTE — Progress Notes (Signed)
 Established patient visit   Patient: Gabriella Marsh   DOB: 05-11-1966   58 y.o. Female  MRN: [id number] Visit Date: 10/24/2024  Today's healthcare provider: LAURAINE LOISE BUOY, DO   Chief Complaint  Patient presents with   Medical Management of Chronic Issues    Patient is here today for a follow on chronic diseases.  Decided to wait until the first of the year for her pap due she will be on medicare by then.   Subjective    HPI Carson Meche is a 58 year old female with diabetes and hypothyroidism who presents for a follow-up visit.  She has been experiencing fluctuations in her blood sugar levels, with her A1c currently between 7.3 and 7.4. She finds weekends challenging due to her activities, such as racing. She uses a device from [employer] to keep her insulin  cold while traveling. Recently, her blood sugar dropped to 59 after eating lunch earlier than usual, which she managed by consuming M&M's. She is currently taking 20 units of Novolog  three times a day with meals. She also uses Trulicity  4.5 mg and has a supply of Novolog  and Trulicity  at home. She mentions a shortage of her nighttime insulin , insulin  glargine.  She takes levothyroxine , alternating between 112 mcg and 125 mcg doses. She stopped taking trazodone  due to morning headaches and now uses melatonin, gabapentin , and nighttime insulin  for sleep management.  She experiences swelling in her feet and legs, which she attributes to lack of mobility. She uses compression socks and is mindful of her salt intake. She has been trying to increase her physical activity by moving around more during the day.  She has neuropathy, experiencing pain and numbness in her hands and feet. She reports a sore on her finger, which her pain doctor suggested might be a bite. She also mentions a toenail issue and plans to see her podiatrist.  She has a history of sleep apnea and plans to address this after transitioning to Medicare in  December. She reports snoring and her husband often tells her to change positions during sleep.  Her blood pressure was elevated today, which she attributes to a recent argument with her husband. She monitors her blood pressure at home, noting it is usually lower in the morning and normalizes throughout the day.      Medications: Outpatient Medications Prior to Visit  Medication Sig   acetaminophen  (TYLENOL ) 500 MG tablet Take 1,000 mg by mouth every 12 (twelve) hours as needed for moderate pain.   atorvastatin  (LIPITOR) 20 MG tablet TAKE 1 TABLET(20 MG) BY MOUTH DAILY   cyclobenzaprine  (FLEXERIL ) 5 MG tablet Take 1 tablet (5 mg total) by mouth 3 (three) times daily as needed for muscle spasms.   Elastic Bandages & Supports (MEDICAL COMPRESSION STOCKINGS) MISC 2 each by Does not apply route daily.   fluticasone  (FLONASE ) 50 MCG/ACT nasal spray Place 2 sprays into both nostrils daily.   furosemide  (LASIX ) 20 MG tablet Take 0.5 tablets (10 mg total) by mouth daily as needed for edema.   gabapentin  (NEURONTIN ) 800 MG tablet Take 1 tablet (800 mg total) by mouth 3 (three) times daily.   glucose blood (ACCU-CHEK GUIDE TEST) test strip Use to check blood sugar up to 3 times a day with insulin  use. Dx code: E11.41   insulin  glargine-yfgn (SEMGLEE ) 100 UNIT/ML Pen Inject up to 75 units daily. Titrate as directed by PCP. Dx code E11.40. Inactivate vial script.   Insulin  Pen Needle  30G X 5 MM MISC Use to inject insulin  4 times per day as prescribed. Dx code: E11.40   Insulin  Syringe-Needle U-100 (INSULIN  SYRINGE 1CC/31GX5/16) 31G X 5/16 1 ML MISC Use with Semglee  vial once a day. Dx code E11.40   levothyroxine  (SYNTHROID ) 112 MCG tablet Take 1 tablet daily. Taking with 125mcg daily for total of 237mcg daily.   levothyroxine  (SYNTHROID ) 125 MCG tablet Take 1 tablet (125 mcg total) by mouth daily.   lisinopril  (ZESTRIL ) 2.5 MG tablet Take 1 tablet (2.5 mg total) by mouth daily.   Melatonin 10 MG TABS  Take 10 mg by mouth at bedtime.   nortriptyline  (PAMELOR ) 50 MG capsule Take 1 capsule (50 mg total) by mouth at bedtime.   nystatin -triamcinolone  ointment (MYCOLOG) Apply 1 Application topically 2 (two) times daily.   omeprazole  (PRILOSEC) 40 MG capsule Take 1 capsule (40 mg total) by mouth daily.   oxyCODONE -acetaminophen  (PERCOCET) 5-325 MG tablet Take 1 tablet by mouth every 8 (eight) hours. Must last 30 days.   [START ON 11/24/2024] oxyCODONE -acetaminophen  (PERCOCET) 5-325 MG tablet Take 1 tablet by mouth every 8 (eight) hours. Must last 30 days.   [START ON 12/24/2024] oxyCODONE -acetaminophen  (PERCOCET) 5-325 MG tablet Take 1 tablet by mouth every 8 (eight) hours. Must last 30 days.   senna (SENOKOT) 8.6 MG tablet Take 1 tablet by mouth every three (3) days as needed for constipation.   traZODone  (DESYREL ) 100 MG tablet Take 1 tablet (100 mg total) by mouth at bedtime.   [DISCONTINUED] Dulaglutide  4.5 MG/0.5ML SOAJ Inject 4.5 mg into the skin once a week.   [DISCONTINUED] insulin  aspart (NOVOLOG ) 100 UNIT/ML FlexPen Inject 20 units three times a day with meals with correction insulin  if needed. Hold if not eating. Max total daily dose of 75 units.   [DISCONTINUED] metFORMIN  (GLUCOPHAGE ) 500 MG tablet Take 2 tablets (1,000 mg total) by mouth 2 (two) times daily with a meal.   [DISCONTINUED] Vitamin D , Ergocalciferol , (DRISDOL ) 1.25 MG (50000 UNIT) CAPS capsule Take 1 capsule (50,000 Units total) by mouth every 7 (seven) days.   No facility-administered medications prior to visit.        Objective    BP (!) 153/90 (BP Location: Right Arm, Patient Position: Sitting, Cuff Size: Normal)   Pulse 86   Temp 97.9 F (36.6 C) (Oral)   Ht 5' 10 (1.778 m)   Wt 256 lb 11.2 oz (116.4 kg)   SpO2 96%   BMI 36.83 kg/m     Physical Exam Vitals and nursing note reviewed.  Constitutional:      General: She is not in acute distress.    Appearance: Normal appearance.  HENT:     Head:  Normocephalic and atraumatic.  Eyes:     General: No scleral icterus.    Conjunctiva/sclera: Conjunctivae normal.  Cardiovascular:     Rate and Rhythm: Normal rate.  Pulmonary:     Effort: Pulmonary effort is normal.  Neurological:     Mental Status: She is alert and oriented to person, place, and time. Mental status is at baseline.  Psychiatric:        Mood and Affect: Mood normal.        Behavior: Behavior normal.      No results found for any visits on 10/24/24.  Assessment & Plan    Type 2 diabetes mellitus with diabetic neuropathy, with long-term current use of insulin  (HCC) -     Hemoglobin A1c -     metFORMIN  HCl; Take  2 tablets (1,000 mg total) by mouth 2 (two) times daily with a meal.  Dispense: 360 tablet; Refill: 3 -     Dulaglutide ; Inject 4.5 mg into the skin once a week.  Dispense: 6 mL; Refill: 0 -     Insulin  Aspart; Inject 20 units three times a day with meals with correction insulin  if needed. Hold if not eating. Max total daily dose of 75 units.  Dispense: 30 mL; Refill: 1  Acquired hypothyroidism -     TSH Rfx on Abnormal to Free T4  Bilateral lower extremity edema  Hypertension associated with diabetes (HCC) -     Comprehensive metabolic panel with GFR  Hyperlipidemia associated with type 2 diabetes mellitus (HCC) -     Lipid panel  Vitamin D  deficiency -     VITAMIN D  25 Hydroxy (Vit-D Deficiency, Fractures) -     Vitamin D  (Ergocalciferol ); Take 1 capsule (50,000 Units total) by mouth every 7 (seven) days.  Dispense: 12 capsule; Refill: 1  Need for influenza vaccination -     Flu vaccine trivalent PF, 6mos and older(Flulaval,Afluria,Fluarix,Fluzone)  Neuropathy -     Vitamin B6     Type 2 diabetes mellitus with diabetic neuropathy, with long-term current use of insulin  Blood glucose fluctuates between 7.3 and 7.4. Recent hypoglycemic episode at 59 mg/dL. Neuropathy includes numbness and pain in extremities. Circulatory complications include  lower extremity edema. Insulin  supply issues noted. - Continue Novolog  and Lantus  regimen. - Monitor glucose with Dexcom. - Adjust insulin  based on meals and glucose. - Address insulin  supply with pharmacy. - Consider weight management.  Acquired hypothyroidism Managed with levothyroxine , alternating doses of 112 mcg and 125 mcg. - Continue current levothyroxine  regimen.  Bilateral lower extremity edema Edema possibly related to circulatory issues and immobility. Compression socks provide relief. Furosemide  effectiveness uncertain. Increased mobility noted. - Continue furosemide . - Use compression socks as tolerated. - Encourage increased mobility.  Consider referral to physical therapy/occupational therapy after the new year (per patient preference) - plan on home health due to inability to leave home on her own.   Hypertension associated with diabetes Chronic, recent elevated reading, likely due to stress/emotional agitation today. Home readings generally normal. - Continue home blood pressure monitoring.  Hyperlipidemia associated with type 2 diabetes mellitus Chronic, stable.  Managed with atorvastatin  20 mg daily.  Will order recheck of lipid panel today.  No changes today.  General Health Maintenance Routine health maintenance discussed. Pap smear due. Missed mammogram. Flu vaccine due. - Schedule Pap smear after December 1st. - Schedule mammogram after December 1st. - Flu vaccine not administered today.     Return in about 6 weeks (around 12/06/2024) for Pap, and Welcome to Medicare visit in 3 months.      I discussed the assessment and treatment plan with the patient  The patient was provided an opportunity to ask questions and all were answered. The patient agreed with the plan and demonstrated an understanding of the instructions.   The patient was advised to call back or seek an in-person evaluation if the symptoms worsen or if the condition fails to improve as  anticipated.    LAURAINE LOISE BUOY, DO  [facility] [phone] (phone) [phone] (fax)  [facility]

## 2024-10-30 ENCOUNTER — Telehealth: Payer: Self-pay | Admitting: Family Medicine

## 2024-10-30 NOTE — Telephone Encounter (Signed)
[facility] faxed refill request for the following medications:  atorvastatin (LIPITOR) 20 MG tablet   Please advise.

## 2024-10-31 ENCOUNTER — Other Ambulatory Visit: Payer: Self-pay

## 2024-10-31 NOTE — Telephone Encounter (Signed)
Patient no longer uses this pharmacy

## 2024-11-01 ENCOUNTER — Encounter: Payer: Self-pay | Admitting: Family Medicine

## 2024-11-03 DIAGNOSIS — E1165 Type 2 diabetes mellitus with hyperglycemia: Secondary | ICD-10-CM | POA: Diagnosis not present

## 2024-11-06 ENCOUNTER — Telehealth: Payer: Self-pay | Admitting: Family Medicine

## 2024-11-06 NOTE — Telephone Encounter (Signed)
 This was sent to [facility] in October and is not due for refill

## 2024-11-06 NOTE — Telephone Encounter (Signed)
[facility] faxed refill request for the following medications:  atorvastatin (LIPITOR) 20 MG tablet   Please advise.

## 2024-11-10 ENCOUNTER — Other Ambulatory Visit: Payer: Self-pay | Admitting: Nurse Practitioner

## 2024-11-13 ENCOUNTER — Other Ambulatory Visit: Payer: Self-pay

## 2024-11-13 ENCOUNTER — Encounter: Payer: Self-pay | Admitting: Family Medicine

## 2024-11-13 MED ORDER — GABAPENTIN 800 MG PO TABS: 800.0000 mg | ORAL_TABLET | Freq: Three times a day (TID) | ORAL | 2 refills | Status: AC

## 2024-11-28 ENCOUNTER — Telehealth: Payer: Self-pay

## 2024-11-28 ENCOUNTER — Telehealth: Payer: Self-pay | Admitting: Family Medicine

## 2024-11-28 ENCOUNTER — Other Ambulatory Visit: Payer: Self-pay

## 2024-11-28 DIAGNOSIS — M62838 Other muscle spasm: Secondary | ICD-10-CM

## 2024-11-28 MED ORDER — CYCLOBENZAPRINE HCL 5 MG PO TABS: 5.0000 mg | ORAL_TABLET | Freq: Three times a day (TID) | ORAL | 3 refills | Status: AC | PRN

## 2024-11-28 NOTE — Telephone Encounter (Signed)
 Printed office notes from 10/24/2024 and faxed to [facility] with [facility] tried to call her to confirm that she received it however I could not get her on the phone.        Copied from CRM #[id number]. Topic: General - Call Back - No Documentation >> Nov 28, 2024 11:23 AM Darshell M wrote: Reason for CRM: Lucie  with [facility]  CB# [phone], fax [phone] requesting the last office visit notes from 10/24/2024. Request was sent via fax but no records have been received.

## 2024-11-28 NOTE — Telephone Encounter (Signed)
[facility] faxed refill request for the following medications: ? ?cyclobenzaprine (FLEXERIL) 5 MG tablet  ? ?Please advise. ? ?

## 2024-11-28 NOTE — Telephone Encounter (Signed)
 Converted to rx request

## 2024-11-28 NOTE — Telephone Encounter (Signed)
 Looks like this was sent to the wrong pharmacy.  Resent rx to correct pharmacy.

## 2024-11-29 ENCOUNTER — Other Ambulatory Visit: Payer: Self-pay

## 2024-12-03 DIAGNOSIS — E1165 Type 2 diabetes mellitus with hyperglycemia: Secondary | ICD-10-CM | POA: Diagnosis not present

## 2024-12-04 ENCOUNTER — Ambulatory Visit: Admitting: Family Medicine

## 2024-12-05 ENCOUNTER — Telehealth: Payer: Self-pay | Admitting: Family Medicine

## 2024-12-05 ENCOUNTER — Encounter: Payer: Self-pay | Admitting: Family Medicine

## 2024-12-05 NOTE — Telephone Encounter (Signed)
 [facility] faxed refill request for the following medications:   furosemide  (LASIX ) 20 MG tablet    insulin  glargine-yfgn (SEMGLEE ) 100 UNIT/ML Pen   lisinopril  (ZESTRIL ) 2.5 MG tablet   metFORMIN  (GLUCOPHAGE ) 500 MG tablet   nortriptyline  (PAMELOR ) 50 MG capsule   insulin  aspart (NOVOLOG ) 100 UNIT/ML FlexPen    omeprazole  (PRILOSEC) 40 MG capsule   omeprazole  (PRILOSEC) 40 MG capsule  levothyroxine  (SYNTHROID ) 112 MCG tablet    levothyroxine  (SYNTHROID ) 125 MCG tablet   traZODone  (DESYREL ) 100 MG tablet  Trulicity  4.5 mg/0.5 ml pen  Ultra-fine pen needle 31G  Vitamin D2 1.25 MG(50,000 unit)   Please advise.

## 2024-12-07 ENCOUNTER — Other Ambulatory Visit: Payer: Self-pay

## 2024-12-07 ENCOUNTER — Telehealth: Payer: Self-pay

## 2024-12-07 DIAGNOSIS — E1140 Type 2 diabetes mellitus with diabetic neuropathy, unspecified: Secondary | ICD-10-CM

## 2024-12-07 DIAGNOSIS — E039 Hypothyroidism, unspecified: Secondary | ICD-10-CM

## 2024-12-07 DIAGNOSIS — R600 Localized edema: Secondary | ICD-10-CM

## 2024-12-07 DIAGNOSIS — G2581 Restless legs syndrome: Secondary | ICD-10-CM

## 2024-12-07 DIAGNOSIS — K219 Gastro-esophageal reflux disease without esophagitis: Secondary | ICD-10-CM

## 2024-12-07 MED ORDER — INSULIN GLARGINE-YFGN 100 UNIT/ML ~~LOC~~ SOPN: PEN_INJECTOR | SUBCUTANEOUS | 2 refills | Status: DC

## 2024-12-07 MED ORDER — LISINOPRIL 2.5 MG PO TABS: 2.5000 mg | ORAL_TABLET | Freq: Every day | ORAL | 3 refills | Status: DC

## 2024-12-07 MED ORDER — OMEPRAZOLE 40 MG PO CPDR: 40.0000 mg | DELAYED_RELEASE_CAPSULE | Freq: Every day | ORAL | 3 refills | Status: DC

## 2024-12-07 MED ORDER — FUROSEMIDE 20 MG PO TABS: 10.0000 mg | ORAL_TABLET | Freq: Every day | ORAL | 0 refills | Status: DC | PRN

## 2024-12-07 MED ORDER — LEVOTHYROXINE SODIUM 112 MCG PO TABS: ORAL_TABLET | ORAL | 1 refills | Status: DC

## 2024-12-07 MED ORDER — METFORMIN HCL 500 MG PO TABS: 1000.0000 mg | ORAL_TABLET | Freq: Two times a day (BID) | ORAL | 3 refills | Status: DC

## 2024-12-07 MED ORDER — NORTRIPTYLINE HCL 50 MG PO CAPS: 50.0000 mg | ORAL_CAPSULE | Freq: Every day | ORAL | 3 refills | Status: DC

## 2024-12-07 NOTE — Telephone Encounter (Signed)
 Copied from CRM #[id number]. Topic: General - Other >> Dec 06, 2024  1:15 PM Sophia H wrote: Reason for CRM: Patient called to check in on a chronic condition form for her insurance - states it was faxed over last Wednesday 12/10. Confirmed # [id number] please be on look out will re fax today.  Can also call and do verbal confirmation - needing before the end of the month. # [phone]

## 2024-12-11 ENCOUNTER — Telehealth: Payer: Self-pay | Admitting: Family Medicine

## 2024-12-11 ENCOUNTER — Other Ambulatory Visit: Payer: Self-pay

## 2024-12-11 DIAGNOSIS — E559 Vitamin D deficiency, unspecified: Secondary | ICD-10-CM

## 2024-12-11 DIAGNOSIS — E1140 Type 2 diabetes mellitus with diabetic neuropathy, unspecified: Secondary | ICD-10-CM

## 2024-12-11 DIAGNOSIS — R600 Localized edema: Secondary | ICD-10-CM

## 2024-12-11 DIAGNOSIS — F5101 Primary insomnia: Secondary | ICD-10-CM

## 2024-12-11 MED ORDER — METFORMIN HCL 500 MG PO TABS: 1000.0000 mg | ORAL_TABLET | Freq: Two times a day (BID) | ORAL | 1 refills | Status: AC

## 2024-12-11 MED ORDER — FUROSEMIDE 20 MG PO TABS: 10.0000 mg | ORAL_TABLET | Freq: Every day | ORAL | 0 refills | Status: AC | PRN

## 2024-12-11 MED ORDER — "INSULIN SYRINGE 31G X 5/16"" 1 ML MISC": 1 refills | Status: DC

## 2024-12-11 NOTE — Telephone Encounter (Signed)
 Called [facility] and have cancelled medication that were sent in 12/07/24.  Furosemide , Metformin , Levothyroxine , Insulin  Glargline and Aspart. Pt picked up Lisinopril , Notriptyline and Omeprazole  3 month worth. Will not refill those until her new request comes in should be around March. Will send others to provider per protocol to review. Trazodone , Vit D. Will send first few to new pharmacy pt is requesting ([facility])

## 2024-12-11 NOTE — Telephone Encounter (Signed)
 Pt. Prefers [facility] instead of [facility]. I just spoke with the patient to confirm. Please send to the correct pharmacy. Pt stated that she is almost out of nighttime insulin  medication.

## 2024-12-11 NOTE — Telephone Encounter (Signed)
 Attempted to call [facility] to cancel rx (insulin  aspart, trazodone  and Vit D) unable to speak with anyone.

## 2024-12-11 NOTE — Telephone Encounter (Signed)
 Refill Request   Pharmacy: [facility]  Medication:   furosemide  (LASIX ) 20 MG tablet  insulin  glargine-yfgn (SEMGLEE ) 100 UNIT/ML Pen  lisinopril  (ZESTRIL ) 2.5 MG tablet  metFORMIN  (GLUCOPHAGE ) 500 MG tablet  nortriptyline  (PAMELOR ) 50 MG capsule  insulin  aspart (NOVOLOG ) 100 UNIT/ML FlexPen  omeprazole  (PRILOSEC) 40 MG capsule  insulin  glargine-yfgn (SEMGLEE ) 100 UNIT/ML Pen  levothyroxine  (SYNTHROID ) 112 MCG tablet  levothyroxine  (SYNTHROID ) 125 MCG tablet  traZODone  (DESYREL ) 100 MG tablet  Dulaglutide  4.5 MG/0.5ML SOAJ  Vitamin D , Ergocalciferol , (DRISDOL ) 1.25 MG (50000 UNIT) CAPS capsule  Ultra-Fine pen needle 31G   Please send in refill request.

## 2024-12-11 NOTE — Telephone Encounter (Signed)
 [facility] encounter. All medication have been recently sent in to [facility] by another clinical staff. Request denied.

## 2024-12-17 ENCOUNTER — Encounter: Payer: Self-pay | Admitting: Family Medicine

## 2024-12-17 DIAGNOSIS — E1140 Type 2 diabetes mellitus with diabetic neuropathy, unspecified: Secondary | ICD-10-CM

## 2024-12-18 MED ORDER — INSULIN GLARGINE-YFGN 100 UNIT/ML ~~LOC~~ SOPN: PEN_INJECTOR | SUBCUTANEOUS | 0 refills | Status: DC

## 2024-12-18 MED ORDER — INSULIN GLARGINE-YFGN 100 UNIT/ML ~~LOC~~ SOPN: PEN_INJECTOR | SUBCUTANEOUS | 2 refills | Status: DC

## 2024-12-23 NOTE — Progress Notes (Unsigned)
 PROVIDER NOTE: Interpretation of information contained herein should be left to medically-trained personnel. Specific patient instructions are provided elsewhere under Patient Instructions section of medical record. This document was created in part using AI and STT-dictation technology, any transcriptional errors that may result from this process are unintentional.  Patient: Gabriella Marsh  Service: E/M   PCP: Donzella Lauraine SAILOR, DO  DOB: [birth year]  DOS: 12/26/2024  Provider: Emmy MARLA Blanch, NP  MRN: [id number]  Delivery: Face-to-face  Specialty: Interventional Pain Management  Type: Established Patient  Setting: Ambulatory outpatient facility  Specialty designation: 09  Referring Prov.: Donzella Lauraine SAILOR, DO  Location: Outpatient office facility       History of present illness (HPI) [name], a 59 y.o. year old female, is here today because of her No primary diagnosis found.. [name] primary complain today is No chief complaint on file.  Pertinent problems: [name] has Diabetic peripheral neuropathy (HCC); RLS (restless legs syndrome); Chronic low back pain (2ry area of Pain) (Bilateral) (L>R) w/o sciatica; DDD (degenerative disc disease), lumbosacral; Disorder of peripheral nervous system; Chronic pain syndrome; Disorder of skeletal system; Chronic hand pain (1ry area of Pain) (Bilateral) (L>R); Chronic feet pain (3ry area of Pain) (Bilateral) (L>R); Chronic shoulder pain (4th area of Pain) (Left); Chronic knee pain (5th area of Pain) (Bilateral) (L>R); Lumbar facet syndrome (Bilateral); Chronic sensorimotor polyneuropathy with axonal and demyelinating features; Osteoarthritis of knees (Bilateral); and Chronic peripheral neuropathic pain on their pertinent problem list.  Pain Assessment: Severity of   is reported as a  /10. Location:    / . Onset:  . Quality:  . Timing:  . Modifying factor(s):  SABRA Vitals:  vitals were not taken for this visit.  BMI: Estimated body mass  index is 36.83 kg/m as calculated from the following:   Height as of 10/24/24: 5' 10 (1.778 m).   Weight as of 10/24/24: 256 lb 11.2 oz (116.4 kg).  Last encounter: 10/03/2024. Last procedure: Visit date not found.  Reason for encounter: {Blank single:19197::medication management,post-procedure evaluation and assessment,both, medication management and post-procedure evaluation and assessment,evaluation of worsening, or previously known (established) problem,patient-requested evaluation,follow-up evaluation,evaluation for possible interventional PM therapy/treatment,evaluation of new problem, *** }.   Discussed the use of AI scribe software for clinical note transcription with the patient, who gave verbal consent to proceed.  History of Present Illness           Pharmacotherapy Assessment   Oxycodone -acetaminophen  (Percocet) 5-32/3255 MG tablet every 8 hours as needed for pain. MME=20 Monitoring: [place] PMP: PDMP reviewed during this encounter.       Pharmacotherapy: No side-effects or adverse reactions reported. Compliance: No problems identified. Effectiveness: Clinically acceptable.  Margrette Nathanel PARAS, RN  10/03/2024  1:41 PM  Sign when Signing Visit Safety precautions to be maintained throughout the outpatient stay will include: orient to surroundings, keep bed in low position, maintain call bell within reach at all times, provide assistance with transfer out of bed and ambulation.   Nursing Pain Medication Assessment:  Safety precautions to be maintained throughout the outpatient stay will include: orient to surroundings, keep bed in low position, maintain call bell within reach at all times, provide assistance with transfer out of bed and ambulation.  Medication Inspection Compliance: Pill count conducted under aseptic conditions, in front of the patient. Neither the pills nor the bottle was removed from the patient's sight at any time. Once count was completed pills  were immediately returned to the patient in  their original bottle.  Medication: Oxycodone /APAP Pill/Patch Count: 78 of 90 pills/patches remain Pill/Patch Appearance: Markings consistent with prescribed medication Bottle Appearance: Standard pharmacy container. Clearly labeled. Filled Date: 56 / 6 / 2025 Last Medication intake:  Today    UDS:  Summary  Date Value Ref Range Status  06/21/2024 FINAL  Final    Comment:    ==================================================================== [employer] [facility] (MW) ==================================================================== Test                             Result       Flag       Units  Drug Present and Declared for Prescription Verification   Oxycodone                       858          EXPECTED   ng/mg creat   Oxymorphone                    768          EXPECTED   ng/mg creat   Noroxycodone                   1293         EXPECTED   ng/mg creat   Noroxymorphone                 402          EXPECTED   ng/mg creat    Sources of oxycodone  are scheduled prescription medications.    Oxymorphone, noroxycodone, and noroxymorphone are expected    metabolites of oxycodone . Oxymorphone is also available as a    scheduled prescription medication.  ==================================================================== Test                      Result    Flag   Units      Ref Range   Creatinine              85               mg/dL      >=79 ==================================================================== Declared Medications:  The flagging and interpretation on this report are based on the  following declared medications.  Unexpected results may arise from  inaccuracies in the declared medications.   **Note: The testing scope of this panel includes these medications:   Oxycodone  (Percocet)   **Note: The testing scope of this panel does not include the  following reported medications:   Acetaminophen  (Tylenol )  Acetaminophen   (Percocet)  Atorvastatin  (Lipitor)  Cyclobenzaprine  (Flexeril )  Dulaglutide  (Trulicity )  Fluticasone  (Flonase )  Furosemide  (Lasix )  Gabapentin  (Neurontin )  Insulin  (NovoLog )  Levothyroxine  (Synthroid )  Lisinopril  (Zestril )  Melatonin  Metformin  (Glucophage )  Nortriptyline  (Pamelor )  Nystatin  (Mycolog-II )  Omeprazole  (Prilosec)  Trazodone  (Desyrel )  Triamcinolone  (Mycolog-II )  Vitamin D2 (Drisdol ) ==================================================================== For clinical consultation, please call [phone]. ====================================================================     No results found for: CBDTHCR No results found for: D8THCCBX No results found for: D9THCCBX  ROS  Constitutional: Denies any fever or chills Gastrointestinal: No reported hemesis, hematochezia, vomiting, or acute GI distress Musculoskeletal: Lower back pain Neurological: No reported episodes of acute onset apraxia, aphasia, dysarthria, agnosia, amnesia, paralysis, loss of coordination, or loss of consciousness  Medication Review  Dulaglutide , INSULIN  SYRINGE 1CC/31GX5/16, Insulin  Pen Needle, Medical Compression Stockings, Melatonin, Vitamin D  (Ergocalciferol ), acetaminophen , atorvastatin , cyclobenzaprine , fluticasone , furosemide , gabapentin , glucose blood,  insulin  aspart, insulin  glargine-yfgn, levothyroxine , lisinopril , metFORMIN , nortriptyline , nystatin -triamcinolone  ointment, omeprazole , oxyCODONE -acetaminophen , senna, and traZODone   History Review  Allergy: [name] is allergic to celecoxib, pregabalin, buspar  [buspirone ], citalopram , and other. Drug: [name]  reports no history of drug use. Alcohol:  reports no history of alcohol use. Tobacco:  reports that she quit smoking about 12 years ago. Her smoking use included cigarettes. She has never used smokeless tobacco. Social: [name]  reports that she quit smoking about 12 years ago. Her smoking use included  cigarettes. She has never used smokeless tobacco. She reports that she does not drink alcohol and does not use drugs. Medical:  has a past medical history of Arthritis, Blood clotting disorder, Degenerative disc disease, lumbar, Diabetes mellitus without complication (HCC), GERD (gastroesophageal reflux disease), Headache, Hyperlipidemia, Hypertension, Neuropathy, and Vertigo. Surgical: [name]  has a past surgical history that includes Cesarean section; Shoulder surgery (Left, 12/26/014); Abdominal hysterectomy; Tonsillectomy and adenoidectomy; Uterine fibroid surgery; Ovarian cyst surgery; Cholecystectomy; left arm frature (10/12/2020); Thyroidectomy (N/A, 03/28/2021); Parathyroidectomy (03/28/2021); Amputation toe (Right, 07/24/2021); Colonoscopy with propofol  (N/A, 02/20/2022); and right femur surgery (Right). Family: family history includes Congestive Heart Failure in her father; Diabetes in her father; Healthy in her brother; Heart disease in her father; Hyperlipidemia in her father; Hypertension in her father; Irritable bowel syndrome in her mother; Osteoporosis in her maternal grandmother and mother.  Laboratory Chemistry Profile   Renal Lab Results  Component Value Date   BUN 15 07/24/2024   CREATININE 0.66 07/24/2024   BCR 23 07/24/2024   GFR 111.18 06/05/2015   GFRAA 92 01/03/2021   GFRNONAA >60 04/04/2024    Hepatic Lab Results  Component Value Date   AST 22 07/24/2024   ALT 27 07/24/2024   ALBUMIN 4.2 07/24/2024   ALKPHOS 77 07/24/2024   LIPASE 29 04/04/2024    Electrolytes Lab Results  Component Value Date   NA 139 07/24/2024   K 5.1 07/24/2024   CL 97 07/24/2024   CALCIUM  8.9 07/24/2024   MG 1.8 01/09/2023    Bone Lab Results  Component Value Date   VD25OH 13.8 (L) 04/21/2024   25OHVITD1 14 (L) 11/12/2021   25OHVITD2 <1.0 11/12/2021   25OHVITD3 13 11/12/2021    Inflammation (CRP: Acute Phase) (ESR: Chronic Phase) Lab Results  Component Value Date   CRP 4  11/12/2021   ESRSEDRATE 39 11/12/2021   LATICACIDVEN 2.9 (HH) 01/07/2023         Note: Above Lab results reviewed.  Recent Imaging Review  CT ABDOMEN PELVIS W CONTRAST CLINICAL DATA:  Right lower quadrant abdominal pain.  EXAM: CT ABDOMEN AND PELVIS WITH CONTRAST  TECHNIQUE: Multidetector CT imaging of the abdomen and pelvis was performed using the standard protocol following bolus administration of intravenous contrast.  RADIATION DOSE REDUCTION: This exam was performed according to the departmental dose-optimization program which includes automated exposure control, adjustment of the mA and/or kV according to patient size and/or use of iterative reconstruction technique.  CONTRAST:  OMNIPAQUE  IOHEXOL  300 MG/ML  SOLN  COMPARISON:  None Available.  FINDINGS: Lower chest: No acute abnormality.  Hepatobiliary: The liver is mildly enlarged. There is a hypodensity in the inferior right lobe of the liver which is too small to characterize measuring 7 mm image 2/37. Gallbladder surgically absent. There is no biliary ductal dilatation.  Pancreas: Unremarkable. No pancreatic ductal dilatation or surrounding inflammatory changes.  Spleen: Normal in size without focal abnormality.  Adrenals/Urinary Tract: Bladder is distended. The bladder otherwise  appears within normal limits. The bilateral kidneys and adrenal glands are within normal limits.  Stomach/Bowel: Stomach is within normal limits. Appendix appears normal. No evidence of bowel wall thickening, distention, or inflammatory changes.  Vascular/Lymphatic: Aortic atherosclerosis. No enlarged abdominal or pelvic lymph nodes.  Reproductive: Uterus and bilateral adnexa are unremarkable.  Other: There is some subcutaneous stranding in the lower anterior abdominal wall. No fluid collection. No significant abdominal wall hernia.  Musculoskeletal: There is mild chronic appearing compression deformity of L1. There  are degenerative changes at L5-S1.  IMPRESSION: 1. No acute localizing process in the abdomen or pelvis. 2. Distended bladder. 3. Mild hepatomegaly. 4. Subcutaneous stranding in the lower anterior abdominal wall, nonspecific. 5. Aortic atherosclerosis.  Electronically Signed   By: Greig Pique M.D.   On: 04/04/2024 18:13 US  Venous Img Lower Unilateral Right CLINICAL DATA:  Right lower extremity pain and edema  EXAM: RIGHT LOWER EXTREMITY VENOUS DOPPLER ULTRASOUND  TECHNIQUE: Gray-scale sonography with graded compression, as well as color Doppler and duplex ultrasound were performed to evaluate the lower extremity deep venous systems from the level of the common femoral vein and including the common femoral, femoral, profunda femoral, popliteal and calf veins including the posterior tibial, peroneal and gastrocnemius veins when visible. Spectral Doppler was utilized to evaluate flow at rest and with distal augmentation maneuvers in the common femoral, femoral and popliteal veins.  COMPARISON:  None Available.  FINDINGS: Contralateral Common Femoral Vein: Respiratory phasicity is normal and symmetric with the symptomatic side. No evidence of thrombus. Normal compressibility.  Common Femoral Vein: No evidence of thrombus. Normal compressibility, respiratory phasicity and response to augmentation.  Saphenofemoral Junction: No evidence of thrombus. Normal compressibility and flow on color Doppler imaging.  Profunda Femoral Vein: No evidence of thrombus. Normal compressibility and flow on color Doppler imaging.  Femoral Vein: No evidence of thrombus. Normal compressibility, respiratory phasicity and response to augmentation.  Popliteal Vein: No evidence of thrombus. Normal compressibility, respiratory phasicity and response to augmentation.  Calf Veins: No evidence of thrombus. Normal compressibility and flow on color Doppler imaging.  IMPRESSION: No evidence of deep  venous thrombosis.  Electronically Signed   By: CHRISTELLA.  Shick M.D.   On: 04/04/2024 14:51 Note: Reviewed        Physical Exam  Vitals: BP 125/73   Pulse 78   Temp (!) 97.3 F (36.3 C)   Ht 5' 10 (1.778 m)   Wt 230 lb (104.3 kg)   SpO2 95%   BMI 33.00 kg/m  BMI: Estimated body mass index is 33 kg/m as calculated from the following:   Height as of this encounter: 5' 10 (1.778 m).   Weight as of this encounter: 230 lb (104.3 kg). Ideal: Ideal body weight: 68.5 kg (151 lb 0.2 oz) Adjusted ideal body weight: 82.8 kg (182 lb 9.7 oz) General appearance: Well nourished, well developed, and well hydrated. In no apparent acute distress Mental status: Alert, oriented x 3 (person, place, & time)       Respiratory: No evidence of acute respiratory distress Eyes: PERLA  Musculoskeletal: + LBP Assessment   Diagnosis Status  1. Chronic low back pain (2ry area of Pain) (Bilateral) (L>R) w/o sciatica   2. Chronic pain syndrome   3. Osteoarthritis of knees (Bilateral)   4. Chronic use of opiate for therapeutic purpose   5. Chronic shoulder pain (4th area of Pain) (Left)   6.  Grade 1 Retrolisthesis (9mm) of L5 over S1   7. Chronic hand  pain (1ry area of Pain) (Bilateral) (L>R)   8. Tricompartment osteoarthritis of knees (Bilateral)    Controlled Controlled Controlled   Updated Problems: Problem  Chronic Use of Opiate for Therapeutic Purpose    Plan of Care  Problem-specific:  Assessment and Plan    Monitoring: [place] PMP: PDMP reviewed during this encounter. {Blank single:19197::Unable to conduct review of the controlled substance reporting system due to technological failure.,     } Pharmacotherapy: {Blank single:19197::Opioid-induced constipation (OIC)(K59.03, T40.2X5A),No side-effects or adverse reactions reported.} Compliance: {Blank single:19197::Medication agreement violation - unsanctioned acquisition/use of additional opioid-containing medication,No problems  identified.} Effectiveness: {Blank single:19197::Clinically acceptable.}  No notes on file  UDS:  Summary  Date Value Ref Range Status  06/21/2024 FINAL  Final    Comment:    ==================================================================== [employer] [facility] ([facility]) ==================================================================== Test                             Result       Flag       Units  Drug Present and Declared for Prescription Verification   Oxycodone                       858          EXPECTED   ng/mg creat   Oxymorphone                    768          EXPECTED   ng/mg creat   Noroxycodone                   1293         EXPECTED   ng/mg creat   Noroxymorphone                 402          EXPECTED   ng/mg creat    Sources of oxycodone  are scheduled prescription medications.    Oxymorphone, noroxycodone, and noroxymorphone are expected    metabolites of oxycodone . Oxymorphone is also available as a    scheduled prescription medication.  ==================================================================== Test                      Result    Flag   Units      Ref Range   Creatinine              85               mg/dL      >=79 ==================================================================== Declared Medications:  The flagging and interpretation on this report are based on the  following declared medications.  Unexpected results may arise from  inaccuracies in the declared medications.   **Note: The testing scope of this panel includes these medications:   Oxycodone  (Percocet)   **Note: The testing scope of this panel does not include the  following reported medications:   Acetaminophen  (Tylenol )  Acetaminophen  (Percocet)  Atorvastatin  (Lipitor)  Cyclobenzaprine  (Flexeril )  Dulaglutide  (Trulicity )  Fluticasone  (Flonase )  Furosemide  (Lasix )  Gabapentin  (Neurontin )  Insulin  (NovoLog )  Levothyroxine  (Synthroid )  Lisinopril  (Zestril )  Melatonin   Metformin  (Glucophage )  Nortriptyline  (Pamelor )  Nystatin  (Mycolog-II )  Omeprazole  (Prilosec)  Trazodone  (Desyrel )  Triamcinolone  (Mycolog-II )  Vitamin D2 (Drisdol ) ==================================================================== For clinical consultation, please call [phone]. ====================================================================     No results found for: CBDTHCR No results found for: D8THCCBX No results found  for: D9THCCBX  ROS  Constitutional: {Blank single:19197::Denies any fever or chills} Gastrointestinal: {Blank single:19197::No reported hemesis, hematochezia, vomiting, or acute GI distress} Musculoskeletal: {Blank single:19197::Denies any acute onset joint swelling, redness, loss of ROM, or weakness} Neurological: {Blank single:19197::No reported episodes of acute onset apraxia, aphasia, dysarthria, agnosia, amnesia, paralysis, loss of coordination, or loss of consciousness}  Medication Review  Dulaglutide , INSULIN  SYRINGE 1CC/31GX5/16, Insulin  Pen Needle, Medical Compression Stockings, Melatonin, Vitamin D  (Ergocalciferol ), acetaminophen , atorvastatin , cyclobenzaprine , fluticasone , furosemide , gabapentin , glucose blood, insulin  aspart, insulin  glargine-yfgn, levothyroxine , lisinopril , metFORMIN , nortriptyline , nystatin -triamcinolone  ointment, omeprazole , oxyCODONE -acetaminophen , senna, and traZODone   History Review  Allergy: Ms. Gasper is allergic to celecoxib, pregabalin, buspar  [buspirone ], citalopram , and other. Drug: [name]  reports no history of drug use. Alcohol:  reports no history of alcohol use. Tobacco:  reports that she quit smoking about 13 years ago. Her smoking use included cigarettes. She has never used smokeless tobacco. Social: [name]  reports that she quit smoking about 13 years ago. Her smoking use included cigarettes. She has never used smokeless tobacco. She reports that she does not drink alcohol and does  not use drugs. Medical:  has a past medical history of Arthritis, Blood clotting disorder, Degenerative disc disease, lumbar, Diabetes mellitus without complication (HCC), GERD (gastroesophageal reflux disease), Headache, Hyperlipidemia, Hypertension, Neuropathy, and Vertigo. Surgical: [name]  has a past surgical history that includes Cesarean section; Shoulder surgery (Left, 12/26/014); Abdominal hysterectomy; Tonsillectomy and adenoidectomy; Uterine fibroid surgery; Ovarian cyst surgery; Cholecystectomy; left arm frature (10/12/2020); Thyroidectomy (N/A, 03/28/2021); Parathyroidectomy (03/28/2021); Amputation toe (Right, 07/24/2021); Colonoscopy with propofol  (N/A, 02/20/2022); and right femur surgery (Right). Family: family history includes Congestive Heart Failure in her father; Diabetes in her father; Healthy in her brother; Heart disease in her father; Hyperlipidemia in her father; Hypertension in her father; Irritable bowel syndrome in her mother; Osteoporosis in her maternal grandmother and mother.  Laboratory Chemistry Profile   Renal Lab Results  Component Value Date   BUN 15 07/24/2024   CREATININE 0.66 07/24/2024   BCR 23 07/24/2024   GFR 111.18 06/05/2015   GFRAA 92 01/03/2021   GFRNONAA >60 04/04/2024    Hepatic Lab Results  Component Value Date   AST 22 07/24/2024   ALT 27 07/24/2024   ALBUMIN 4.2 07/24/2024   ALKPHOS 77 07/24/2024   LIPASE 29 04/04/2024    Electrolytes Lab Results  Component Value Date   NA 139 07/24/2024   K 5.1 07/24/2024   CL 97 07/24/2024   CALCIUM  8.9 07/24/2024   MG 1.8 01/09/2023    Bone Lab Results  Component Value Date   VD25OH 13.8 (L) 04/21/2024   25OHVITD1 14 (L) 11/12/2021   25OHVITD2 <1.0 11/12/2021   25OHVITD3 13 11/12/2021    Inflammation (CRP: Acute Phase) (ESR: Chronic Phase) Lab Results  Component Value Date   CRP 4 11/12/2021   ESRSEDRATE 39 11/12/2021   LATICACIDVEN 2.9 (HH) 01/07/2023         Note: {Blank  single:19197::Ms. Jannetta indicates labs done and monitored by primary care practitioner using a non-[facility] EMR system,No results found under the [facility] electronic medical record,Patient noncompliant with Lab Work orders.,Results made available to patient.,Lab results reviewed and made available to patient.,Lab results reviewed and explained to patient in Layman's terms.,Above Lab results reviewed.}  Recent Imaging Review  CT ABDOMEN PELVIS W CONTRAST CLINICAL DATA:  Right lower quadrant abdominal pain.  EXAM: CT ABDOMEN AND PELVIS WITH CONTRAST  TECHNIQUE: Multidetector CT imaging of the abdomen and pelvis was performed  using the standard protocol following bolus administration of intravenous contrast.  RADIATION DOSE REDUCTION: This exam was performed according to the departmental dose-optimization program which includes automated exposure control, adjustment of the mA and/or kV according to patient size and/or use of iterative reconstruction technique.  CONTRAST:  OMNIPAQUE  IOHEXOL  300 MG/ML  SOLN  COMPARISON:  None Available.  FINDINGS: Lower chest: No acute abnormality.  Hepatobiliary: The liver is mildly enlarged. There is a hypodensity in the inferior right lobe of the liver which is too small to characterize measuring 7 mm image 2/37. Gallbladder surgically absent. There is no biliary ductal dilatation.  Pancreas: Unremarkable. No pancreatic ductal dilatation or surrounding inflammatory changes.  Spleen: Normal in size without focal abnormality.  Adrenals/Urinary Tract: Bladder is distended. The bladder otherwise appears within normal limits. The bilateral kidneys and adrenal glands are within normal limits.  Stomach/Bowel: Stomach is within normal limits. Appendix appears normal. No evidence of bowel wall thickening, distention, or inflammatory changes.  Vascular/Lymphatic: Aortic atherosclerosis. No enlarged abdominal or pelvic lymph  nodes.  Reproductive: Uterus and bilateral adnexa are unremarkable.  Other: There is some subcutaneous stranding in the lower anterior abdominal wall. No fluid collection. No significant abdominal wall hernia.  Musculoskeletal: There is mild chronic appearing compression deformity of L1. There are degenerative changes at L5-S1.  IMPRESSION: 1. No acute localizing process in the abdomen or pelvis. 2. Distended bladder. 3. Mild hepatomegaly. 4. Subcutaneous stranding in the lower anterior abdominal wall, nonspecific. 5. Aortic atherosclerosis.  Electronically Signed   By: Greig Pique M.D.   On: 04/04/2024 18:13 US  Venous Img Lower Unilateral Right CLINICAL DATA:  Right lower extremity pain and edema  EXAM: RIGHT LOWER EXTREMITY VENOUS DOPPLER ULTRASOUND  TECHNIQUE: Gray-scale sonography with graded compression, as well as color Doppler and duplex ultrasound were performed to evaluate the lower extremity deep venous systems from the level of the common femoral vein and including the common femoral, femoral, profunda femoral, popliteal and calf veins including the posterior tibial, peroneal and gastrocnemius veins when visible. Spectral Doppler was utilized to evaluate flow at rest and with distal augmentation maneuvers in the common femoral, femoral and popliteal veins.  COMPARISON:  None Available.  FINDINGS: Contralateral Common Femoral Vein: Respiratory phasicity is normal and symmetric with the symptomatic side. No evidence of thrombus. Normal compressibility.  Common Femoral Vein: No evidence of thrombus. Normal compressibility, respiratory phasicity and response to augmentation.  Saphenofemoral Junction: No evidence of thrombus. Normal compressibility and flow on color Doppler imaging.  Profunda Femoral Vein: No evidence of thrombus. Normal compressibility and flow on color Doppler imaging.  Femoral Vein: No evidence of thrombus. Normal  compressibility, respiratory phasicity and response to augmentation.  Popliteal Vein: No evidence of thrombus. Normal compressibility, respiratory phasicity and response to augmentation.  Calf Veins: No evidence of thrombus. Normal compressibility and flow on color Doppler imaging.  IMPRESSION: No evidence of deep venous thrombosis.  Electronically Signed   By: CHRISTELLA.  Shick M.D.   On: 04/04/2024 14:51 Note: {Blank single:19197::No new results found.,No results found under the [facility] electronic medical record.,Imaging results reviewed and explained to patient in Layman's terms.,Results of ordered imaging test(s) reviewed and explained to patient in Layman's terms.,Imaging results reviewed.,Reviewed} {Blank single:19197::Results visible under [facility].,Results visible under [facility].,Results visible under [facility].,Results visible under [facility].,Results visible under Care Everywhere.,Results made available to patient.,Copy of results provided to patient.,Patient noncompliant with diagnostic imaging orders.,     }  Physical Exam  Vitals: There  were no vitals taken for this visit. BMI: Estimated body mass index is 36.83 kg/m as calculated from the following:   Height as of 10/24/24: 5' 10 (1.778 m).   Weight as of 10/24/24: 256 lb 11.2 oz (116.4 kg). Ideal: Patient weight not recorded General appearance: {general exam:210120802::Well nourished, well developed, and well hydrated. In no apparent acute distress} Mental status: {Blank single:19197::Alert and oriented x 3. Exaggerated physical and/or psychosocial pain behavior perceived.,Alert, oriented x 3 (person, place, & time)} {Blank single:19197::[name]'s speech pattern and demeanor seems to suggest oversedation,     } Respiratory: {Blank single:19197::Oxygen-dependent COPD,No evidence of acute respiratory distress} Eyes: {Blank single:19197::Miotic (pupilary constriction) due to  opiate use,Midriatic,Anisocoric,Evidence of ptosis,Pin-point pupils,PERLA}   Assessment   Diagnosis Status  1. Chronic pain syndrome   2. Osteoarthritis of knees (Bilateral)   3. Chronic shoulder pain (4th area of Pain) (Left)   4.  Grade 1 Retrolisthesis (9mm) of L5 over S1   5. Chronic hand pain (1ry area of Pain) (Bilateral) (L>R)   6. Chronic use of opiate for therapeutic purpose   7. Tricompartment osteoarthritis of knees (Bilateral)   8. Chronic low back pain (2ry area of Pain) (Bilateral) (L>R) w/o sciatica    {Blank single:19197::Absent,Deteriorating,Having a Flare-up,Improved,Improving,Not improving,Not responding,Persistent,Present,Recurring,Reoccurring,Resolved,Responding,Stable,Unchanged,improved,Worsened,Worsening,Controlled} {Blank single:19197::Absent,Deteriorating,Having a Flare-up,Improved,Improving,Not improving,Not responding,Persistent,Present,Recurring,Reoccurring,Resolved,Responding,Stable,Unchanged,improved,Worsened,Worsening,Controlled} {Blank single:19197::Absent,Deteriorating,Having a Flare-up,Improved,Improving,Not improving,Not responding,Persistent,Present,Recurring,Reoccurring,Resolved,Responding,Stable,Unchanged,improved,Worsened,Worsening,Controlled}   Updated Problems: No problems updated.  Plan of Care  Problem-specific:  Assessment and Plan            Ms. Mayana Irigoyen has a current medication list which includes the following long-term medication(s): atorvastatin , medical compression stockings, fluticasone , furosemide , gabapentin , insulin  aspart, insulin  pen needle, levothyroxine , levothyroxine , lisinopril , metformin , nortriptyline , omeprazole , [START ON 12/24/2024] oxycodone -acetaminophen , and trazodone .  Pharmacotherapy (Medications Ordered): No orders of the defined types were placed in this encounter.  Orders:  No  orders of the defined types were placed in this encounter.    {There is no content from the last Plan section.}   No follow-ups on file.    Recent Visits Date Type Provider Dept  10/03/24 Office Visit Kaston Faughn K, NP [facility]  Showing recent visits within past 90 days and meeting all other requirements Future Appointments Date Type Provider Dept  12/26/24 Appointment Rosselyn Martha K, NP [facility]  Showing future appointments within next 90 days and meeting all other requirements  I discussed the assessment and treatment plan with the patient. The patient was provided an opportunity to ask questions and all were answered. The patient agreed with the plan and demonstrated an understanding of the instructions.  Patient advised to call back or seek an in-person evaluation if the symptoms or condition worsens.  Duration of encounter: *** minutes.  Total time on encounter, as per AMA guidelines included both the face-to-face and non-face-to-face time personally spent by the physician and/or other qualified health care professional(s) on the day of the encounter (includes time in activities that require the physician or other qualified health care professional and does not include time in activities normally performed by clinical staff). Physician's time may include the following activities when performed: Preparing to see the patient (e.g., pre-charting review of records, searching for previously ordered imaging, lab work, and nerve conduction tests) Review of prior analgesic pharmacotherapies. Reviewing PMP Interpreting ordered tests (e.g., lab work, imaging, nerve conduction tests) Performing post-procedure evaluations, including interpretation of diagnostic procedures Obtaining and/or reviewing separately obtained history Performing a medically appropriate examination and/or evaluation Counseling and educating the patient/family/caregiver Ordering  medications,  tests, or procedures Referring and communicating with other health care professionals (when not separately reported) Documenting clinical information in the electronic or other health record Independently interpreting results (not separately reported) and communicating results to the patient/ family/caregiver Care coordination (not separately reported)  Note by: Taishaun Levels K Kennia Vanvorst, NP (TTS and AI technology used. I apologize for any typographical errors that were not detected and corrected.) Date: 12/26/2024; Time: 3:17 PM

## 2024-12-26 ENCOUNTER — Other Ambulatory Visit: Payer: Self-pay

## 2024-12-26 ENCOUNTER — Encounter: Payer: Self-pay | Admitting: Nurse Practitioner

## 2024-12-26 ENCOUNTER — Telehealth: Payer: Self-pay | Admitting: Family Medicine

## 2024-12-26 ENCOUNTER — Ambulatory Visit: Admitting: Nurse Practitioner

## 2024-12-26 VITALS — BP 123/75 | HR 80 | Temp 97.1°F | Resp 18 | Ht 70.0 in | Wt 230.0 lb

## 2024-12-26 DIAGNOSIS — M79642 Pain in left hand: Secondary | ICD-10-CM | POA: Insufficient documentation

## 2024-12-26 DIAGNOSIS — M170 Bilateral primary osteoarthritis of knee: Secondary | ICD-10-CM | POA: Diagnosis present

## 2024-12-26 DIAGNOSIS — G8929 Other chronic pain: Secondary | ICD-10-CM | POA: Diagnosis present

## 2024-12-26 DIAGNOSIS — M4310 Spondylolisthesis, site unspecified: Secondary | ICD-10-CM | POA: Diagnosis present

## 2024-12-26 DIAGNOSIS — Z79891 Long term (current) use of opiate analgesic: Secondary | ICD-10-CM | POA: Diagnosis present

## 2024-12-26 DIAGNOSIS — M25512 Pain in left shoulder: Secondary | ICD-10-CM | POA: Insufficient documentation

## 2024-12-26 DIAGNOSIS — M545 Low back pain, unspecified: Secondary | ICD-10-CM | POA: Insufficient documentation

## 2024-12-26 DIAGNOSIS — Z794 Long term (current) use of insulin: Secondary | ICD-10-CM

## 2024-12-26 DIAGNOSIS — M79641 Pain in right hand: Secondary | ICD-10-CM | POA: Insufficient documentation

## 2024-12-26 DIAGNOSIS — F5101 Primary insomnia: Secondary | ICD-10-CM

## 2024-12-26 DIAGNOSIS — G2581 Restless legs syndrome: Secondary | ICD-10-CM

## 2024-12-26 DIAGNOSIS — E1142 Type 2 diabetes mellitus with diabetic polyneuropathy: Secondary | ICD-10-CM | POA: Insufficient documentation

## 2024-12-26 DIAGNOSIS — E1140 Type 2 diabetes mellitus with diabetic neuropathy, unspecified: Secondary | ICD-10-CM

## 2024-12-26 DIAGNOSIS — G629 Polyneuropathy, unspecified: Secondary | ICD-10-CM | POA: Diagnosis present

## 2024-12-26 DIAGNOSIS — E559 Vitamin D deficiency, unspecified: Secondary | ICD-10-CM

## 2024-12-26 DIAGNOSIS — K219 Gastro-esophageal reflux disease without esophagitis: Secondary | ICD-10-CM

## 2024-12-26 DIAGNOSIS — G894 Chronic pain syndrome: Secondary | ICD-10-CM | POA: Insufficient documentation

## 2024-12-26 DIAGNOSIS — E039 Hypothyroidism, unspecified: Secondary | ICD-10-CM

## 2024-12-26 MED ORDER — OXYCODONE-ACETAMINOPHEN 5-325 MG PO TABS: 1.0000 | ORAL_TABLET | Freq: Three times a day (TID) | ORAL | 0 refills | Status: AC

## 2024-12-26 NOTE — Telephone Encounter (Signed)
 Refill Request    Pharmacy: [facility]   Medication:    insulin  glargine-yfgn (SEMGLEE ) 100 UNIT/ML Pen  lisinopril  (ZESTRIL ) 2.5 MG tablet  nortriptyline  (PAMELOR ) 50 MG capsule  insulin  aspart (NOVOLOG ) 100 UNIT/ML FlexPen  omeprazole  (PRILOSEC) 40 MG capsule  insulin  glargine-yfgn (SEMGLEE ) 100 UNIT/ML Pen  levothyroxine  (SYNTHROID ) 112 MCG tablet  levothyroxine  (SYNTHROID ) 125 MCG tablet  traZODone  (DESYREL ) 100 MG tablet  Dulaglutide  4.5 MG/0.5ML SOAJ  Vitamin D2, 1.25 MG (50000 UNIT)  Ultra-fine pen needles 31G     Please send in refill request.

## 2024-12-26 NOTE — Telephone Encounter (Signed)
 This patient has been trying to get all [facility].  It has been getting sent to several other pharmacies. If you would approve [facility] please.    They also requested both of the levothyroxine .  I checked with the patient and she does take both the 125 and the 112

## 2024-12-26 NOTE — Progress Notes (Signed)
 Nursing Pain Medication Assessment:  Safety precautions to be maintained throughout the outpatient stay will include: orient to surroundings, keep bed in low position, maintain call bell within reach at all times, provide assistance with transfer out of bed and ambulation.  Medication Inspection Compliance: Pill count conducted under aseptic conditions, in front of the patient. Neither the pills nor the bottle was removed from the patient's sight at any time. Once count was completed pills were immediately returned to the patient in their original bottle.  Medication: Oxycodone /APAP Pill/Patch Count: 30 of 90 pills/patches remain Pill/Patch Appearance: Markings consistent with prescribed medication Bottle Appearance: Standard pharmacy container. Clearly labeled. Filled Date: 55 / 10 / 2025 Last Medication intake:  Yesterday

## 2025-01-01 ENCOUNTER — Encounter: Payer: Self-pay | Admitting: Family Medicine

## 2025-01-01 ENCOUNTER — Other Ambulatory Visit: Payer: Self-pay | Admitting: Family Medicine

## 2025-01-01 DIAGNOSIS — E1140 Type 2 diabetes mellitus with diabetic neuropathy, unspecified: Secondary | ICD-10-CM

## 2025-01-01 MED ORDER — OMEPRAZOLE 40 MG PO CPDR: 40.0000 mg | DELAYED_RELEASE_CAPSULE | Freq: Every day | ORAL | 3 refills | Status: AC

## 2025-01-01 MED ORDER — LEVOTHYROXINE SODIUM 112 MCG PO TABS: ORAL_TABLET | ORAL | 1 refills | Status: AC

## 2025-01-01 MED ORDER — LEVOTHYROXINE SODIUM 125 MCG PO TABS: 125.0000 ug | ORAL_TABLET | Freq: Every day | ORAL | 3 refills | Status: AC

## 2025-01-01 MED ORDER — NORTRIPTYLINE HCL 50 MG PO CAPS: 50.0000 mg | ORAL_CAPSULE | Freq: Every day | ORAL | 3 refills | Status: AC

## 2025-01-01 MED ORDER — DULAGLUTIDE 4.5 MG/0.5ML ~~LOC~~ SOAJ: 4.5000 mg | SUBCUTANEOUS | 0 refills | Status: AC

## 2025-01-01 MED ORDER — TRAZODONE HCL 100 MG PO TABS: 100.0000 mg | ORAL_TABLET | Freq: Every day | ORAL | 3 refills | Status: DC

## 2025-01-01 MED ORDER — LISINOPRIL 2.5 MG PO TABS: 2.5000 mg | ORAL_TABLET | Freq: Every day | ORAL | 3 refills | Status: AC

## 2025-01-01 MED ORDER — "INSULIN SYRINGE 31G X 5/16"" 1 ML MISC": 1 refills | Status: AC

## 2025-01-01 MED ORDER — VITAMIN D (ERGOCALCIFEROL) 1.25 MG (50000 UNIT) PO CAPS: 50000.0000 [IU] | ORAL_CAPSULE | ORAL | 1 refills | Status: AC

## 2025-01-01 NOTE — Telephone Encounter (Signed)
 No Humalog on Medication profile

## 2025-01-01 NOTE — Telephone Encounter (Unsigned)
 Copied from CRM #[id number]. Topic: Clinical - Medication Refill >> Jan 01, 2025  2:20 PM Victoria B wrote: Medication: pen tips/Humalog  100units/Trulicity  0.5/   Insulin  Pen Needle 30G X 5 MM MISC   Has the patient contacted their pharmacy? yes (Agent: If no, request that the patient contact the pharmacy for the refill. If patient does not wish to contact the pharmacy document the reason why and proceed with request.) (Agent: If yes, when and what did the pharmacy advise?)pharmacy called directly in  This is the patient's preferred pharmacy:  [facility] - [place], [place] - [address] [place] [address] Phone: [phone] Fax: [phone]  Is this the correct pharmacy for this prescription? yes   Has the prescription been filled recently? no  Is the patient out of the medication? yes  Has the patient been seen for an appointment in the last year OR does the patient have an upcoming appointment? yes  Can we respond through MyChart? yes  Agent: Please be advised that Rx refills may take up to 3 business days. We ask that you follow-up with your pharmacy.

## 2025-01-02 MED ORDER — INSULIN PEN NEEDLE 30G X 5 MM MISC: 5 refills | Status: AC

## 2025-01-02 NOTE — Telephone Encounter (Signed)
 Requested Prescriptions  Pending Prescriptions Disp Refills   Insulin  Pen Needle 30G X 5 MM MISC 400 each 5    Sig: Use to inject insulin  4 times per day as prescribed. Dx code: E11.40     Endocrinology: Diabetes - Testing Supplies Passed - 01/02/2025  2:22 PM      Passed - Valid encounter within last 12 months    Recent Outpatient Visits           2 months ago Type 2 diabetes mellitus with diabetic neuropathy, with long-term current use of insulin  [facility])   [place] [facility] [place], Lauraine SAILOR, DO   5 months ago Type 2 diabetes mellitus with diabetic neuropathy, with long-term current use of insulin  [facility])   [place] [facility] [place], Lauraine SAILOR, DO   6 months ago Type 2 diabetes mellitus with diabetic neuropathy, with long-term current use of insulin  [facility])   [place] [facility] [place], Lauraine SAILOR, DO   8 months ago Type 2 diabetes mellitus with diabetic neuropathy, with long-term current use of insulin  [facility])   [place] [facility] [place], [place], PA-C               Dulaglutide  4.5 MG/0.5ML SOAJ 6 mL 0    Sig: Inject 4.5 mg into the skin once a week.     Endocrinology:  Diabetes - GLP-1 Receptor Agonists Failed - 01/02/2025  2:22 PM      Failed - HBA1C is between 0 and 7.9 and within 180 days    Hgb A1c MFr Bld  Date Value Ref Range Status  07/24/2024 8.2 (H) 4.8 - 5.6 % Final    Comment:             Prediabetes: 5.7 - 6.4          Diabetes: >6.4          Glycemic control for adults with diabetes: <7.0          Passed - Valid encounter within last 6 months    Recent Outpatient Visits           2 months ago Type 2 diabetes mellitus with diabetic neuropathy, with long-term current use of insulin  [facility])   [place] [facility] [place], Lauraine SAILOR, DO   5 months ago Type 2 diabetes mellitus with diabetic neuropathy, with long-term current use of insulin  [facility])   [place] [facility] [place], Lauraine SAILOR, DO   6 months ago Type 2 diabetes mellitus with diabetic neuropathy, with long-term current use of insulin  [facility])   [place] [facility] [place], Lauraine SAILOR, DO   8 months ago Type 2 diabetes mellitus with diabetic neuropathy, with long-term current use of insulin  [facility])   [place] [facility] [place], Janna, PA-C

## 2025-01-02 NOTE — Telephone Encounter (Signed)
 Refilled 01/01/25 6 ml. Requested Prescriptions  Signed Prescriptions Disp Refills   Insulin  Pen Needle 30G X 5 MM MISC 400 each 5    Sig: Use to inject insulin  4 times per day as prescribed. Dx code: E11.40     Endocrinology: Diabetes - Testing Supplies Passed - 01/02/2025  2:23 PM      Passed - Valid encounter within last 12 months    Recent Outpatient Visits           2 months ago Type 2 diabetes mellitus with diabetic neuropathy, with long-term current use of insulin  [facility])   [place] [facility] [place], Lauraine SAILOR, DO   5 months ago Type 2 diabetes mellitus with diabetic neuropathy, with long-term current use of insulin  [facility])   [facility] [place], Lauraine SAILOR, DO   6 months ago Type 2 diabetes mellitus with diabetic neuropathy, with long-term current use of insulin  [facility])   [place] [facility] [place], Lauraine SAILOR, DO   8 months ago Type 2 diabetes mellitus with diabetic neuropathy, with long-term current use of insulin  [facility])   [place] [facility] [place], Janna, PA-C              Refused Prescriptions Disp Refills   Dulaglutide  4.5 MG/0.5ML SOAJ 6 mL 0    Sig: Inject 4.5 mg into the skin once a week.     Endocrinology:  Diabetes - GLP-1 Receptor Agonists Failed - 01/02/2025  2:23 PM      Failed - HBA1C is between 0 and 7.9 and within 180 days    Hgb A1c MFr Bld  Date Value Ref Range Status  07/24/2024 8.2 (H) 4.8 - 5.6 % Final    Comment:             Prediabetes: 5.7 - 6.4          Diabetes: >6.4          Glycemic control for adults with diabetes: <7.0          Passed - Valid encounter within last 6 months    Recent Outpatient Visits           2 months ago Type 2 diabetes mellitus with diabetic neuropathy, with long-term current use of insulin  [facility])   [place] [facility] [place], Lauraine SAILOR, DO   5 months ago Type 2 diabetes mellitus with diabetic neuropathy, with long-term current  use of insulin  [facility])   [place] [facility] [place], Lauraine SAILOR, DO   6 months ago Type 2 diabetes mellitus with diabetic neuropathy, with long-term current use of insulin  [facility])   [place] [facility] [place], Lauraine SAILOR, DO   8 months ago Type 2 diabetes mellitus with diabetic neuropathy, with long-term current use of insulin  [facility])    [facility] [place], Janna, PA-C

## 2025-01-03 ENCOUNTER — Ambulatory Visit
Attending: Student in an Organized Health Care Education/Training Program | Admitting: Student in an Organized Health Care Education/Training Program

## 2025-01-03 ENCOUNTER — Encounter: Payer: Self-pay | Admitting: Student in an Organized Health Care Education/Training Program

## 2025-01-03 VITALS — BP 117/72 | HR 96 | Temp 97.1°F | Resp 18 | Ht 70.0 in | Wt 235.0 lb

## 2025-01-03 DIAGNOSIS — G629 Polyneuropathy, unspecified: Secondary | ICD-10-CM | POA: Diagnosis present

## 2025-01-03 DIAGNOSIS — E1142 Type 2 diabetes mellitus with diabetic polyneuropathy: Secondary | ICD-10-CM | POA: Diagnosis present

## 2025-01-03 MED ORDER — CAPSAICIN-CLEANSING GEL 8 % EX KIT
4.0000 | PACK | Freq: Once | CUTANEOUS | Status: AC
  Administered 2025-01-03: 1 via TOPICAL

## 2025-01-03 MED FILL — Capsaicin Patch 8% & Cleansing Gel Kit: CUTANEOUS | Qty: 1 | Status: AC

## 2025-01-03 NOTE — Progress Notes (Signed)
 PROVIDER NOTE: Interpretation of information contained herein should be left to medically-trained personnel. Specific patient instructions are provided elsewhere under Patient Instructions section of medical record. This document was created in part using STT-dictation technology, any transcriptional errors that may result from this process are unintentional.  Patient: Gabriella Marsh Type: Established DOB: March 30, 1966 MRN: [id number] PCP: Donzella Lauraine SAILOR, DO  Service: Procedure DOS: 01/03/2025 Setting: Ambulatory Location: Ambulatory outpatient facility Delivery: Face-to-face Provider: Wallie Sherry, MD Specialty: Interventional Pain Management Specialty designation: 09 Location: Outpatient facility Ref. Prov.: Pardue, Lauraine SAILOR, DO       Interventional Therapy   Type: Qutenza  Neurolysis #2  Laterality:  Bilateral Area treated: Feet Imaging Guidance: None Anesthesia/analgesia/anxiolysis/sedation: None required Medication (Right): Qutenza  (capsaicin  8%) topical system Medication (Left): Qutenza  (capsaicin  8%) topical system Date: 01/03/2025 Performed by: Wallie Sherry, MD Rationale (medical necessity): procedure needed and proper for the treatment of Gabriella Marsh's medical symptoms and needs. Indication: Painful diabetic peripheral neuralgia (DPN) (ICD-10-CM:E11.40) severe enough to impact quality of life or function. 1. Diabetic peripheral neuropathy (HCC)   2. Neuropathy    NAS-11 Pain score:   Pre-procedure: 8 /10   Post-procedure: 8 /10     Position / Prep / Materials:  Position: Supine  Materials: Qutenza  Kit (4 patches)  H&P (Pre-op Assessment):  Gabriella Marsh is a 59 y.o. (year old), female patient, seen today for interventional treatment. She  has a past surgical history that includes Cesarean section; Shoulder surgery (Left, 12/26/014); Abdominal hysterectomy; Tonsillectomy and adenoidectomy; Uterine fibroid surgery; Ovarian cyst surgery; Cholecystectomy; left arm  frature (10/12/2020); Thyroidectomy (N/A, 03/28/2021); Parathyroidectomy (03/28/2021); Amputation toe (Right, 07/24/2021); Colonoscopy with propofol  (N/A, 02/20/2022); and right femur surgery (Right). Gabriella Marsh has a current medication list which includes the following prescription(s): acetaminophen , atorvastatin , cyclobenzaprine , dulaglutide , medical compression stockings, fluticasone , furosemide , gabapentin , accu-chek guide test, insulin  aspart, insulin  glargine-yfgn, insulin  glargine-yfgn, insulin  pen needle, insulin  syringe 1cc/31gx5/16, levothyroxine , levothyroxine , lisinopril , melatonin, metformin , nortriptyline , nystatin -triamcinolone  ointment, omeprazole , oxycodone -acetaminophen , [START ON 01/29/2025] oxycodone -acetaminophen , [START ON 02/28/2025] oxycodone -acetaminophen , senna, trazodone , and vitamin d  (ergocalciferol ). Her primarily concern today is the Foot Pain (bilateral)  Initial Vital Signs:  Pulse/HCG Rate: 96  Temp: (!) 97.1 F (36.2 C) Resp: 18 BP: 117/72 SpO2: 97 %  BMI: Estimated body mass index is 33.72 kg/m as calculated from the following:   Height as of this encounter: 5' 10 (1.778 m).   Weight as of this encounter: 235 lb (106.6 kg).  Risk Assessment: Allergies: Reviewed. She is allergic to celecoxib, pregabalin, buspar  [buspirone ], citalopram , and other.  Allergy Precautions: None required Coagulopathies: Reviewed. None identified.  Blood-thinner therapy: None at this time Active Infection(s): Reviewed. None identified. Gabriella Marsh is afebrile  Site Confirmation: Gabriella Marsh was asked to confirm the procedure and laterality before marking the site Procedure checklist: Completed Consent: Before the procedure and under the influence of no sedative(s), amnesic(s), or anxiolytics, the patient was informed of the treatment options, risks and possible complications. To fulfill our ethical and legal obligations, as recommended by the American Medical Association's Code of  Ethics, I have informed the patient of my clinical impression; the nature and purpose of the treatment or procedure; the risks, benefits, and possible complications of the intervention; the alternatives, including doing nothing; the risk(s) and benefit(s) of the alternative treatment(s) or procedure(s); and the risk(s) and benefit(s) of doing nothing. The patient was provided information about the general risks and possible complications associated with the procedure. These may include, but are not limited to: failure to achieve  desired goals, infection, bleeding, organ or nerve damage, allergic reactions, paralysis, and death. In addition, the patient was informed of those risks and complications associated to the procedure, such as failure to decrease pain; infection; bleeding; organ or nerve damage with subsequent damage to sensory, motor, and/or autonomic systems, resulting in permanent pain, numbness, and/or weakness of one or several areas of the body; allergic reactions; (i.e.: anaphylactic reaction); and/or death. Furthermore, the patient was informed of those risks and complications associated with the medications. These include, but are not limited to: allergic reactions (i.e.: anaphylactic or anaphylactoid reaction(s)); adrenal axis suppression; blood sugar elevation that in diabetics may result in ketoacidosis or comma; water retention that in patients with history of congestive heart failure may result in shortness of breath, pulmonary edema, and decompensation with resultant heart failure; weight gain; swelling or edema; medication-induced neural toxicity; particulate matter embolism and blood vessel occlusion with resultant organ, and/or nervous system infarction; and/or aseptic necrosis of one or more joints. Finally, the patient was informed that Medicine is not an exact science; therefore, there is also the possibility of unforeseen or unpredictable risks and/or possible complications that may  result in a catastrophic outcome. The patient indicated having understood very clearly. We have given the patient no guarantees and we have made no promises. Enough time was given to the patient to ask questions, all of which were answered to the patient's satisfaction. Gabriella Marsh has indicated that she wanted to continue with the procedure. Attestation: I, the ordering provider, attest that I have discussed with the patient the benefits, risks, side-effects, alternatives, likelihood of achieving goals, and potential problems during recovery for the procedure that I have provided informed consent. Date  Time: 01/03/2025  1:40 PM  Pre-Procedure Preparation:  Monitoring: As per clinic protocol. Respiration, ETCO2, SpO2, BP, heart rate and rhythm monitor placed and checked for adequate function Safety Precautions: Patient was assessed for positional comfort and pressure points before starting the procedure. Time-out: I initiated and conducted the Time-out before starting the procedure, as per protocol. The patient was asked to participate by confirming the accuracy of the Time Out information. Verification of the correct person, site, and procedure were performed and confirmed by me, the nursing staff, and the patient. Time-out conducted as per Joint Commission's Universal Protocol (UP.01.01.01). Time: 1420 Start Time: 1426 hrs.  Description/Narrative of Procedure:          Region: Distal lower extremities Target Area: Sensory peripheral nerves affected by diabetic peripheral neuropathy Site: Feet Approach: Percutaneous  No./Series: Not applicable  Type: Percutaneous  Purpose: Therapeutic  Region: Distal lower extremities  Start Time: 1426 hrs.  Description of the Procedure: Protocol guidelines were followed. The patient was assisted into a comfortable position.  Informed consent was obtained in the patient monitored in the usual manner.  All questions were answered prior to the procedure.   They Qutenza  patches were applied to the affected area and then covered with the wrap.  The Patient was kept under observation until the treatment was completed.  The patches were removed and the treated area was inspected.  Vitals:   01/03/25 1344  BP: 117/72  Pulse: 96  Resp: 18  Temp: (!) 97.1 F (36.2 C)  TempSrc: Temporal  SpO2: 97%  Weight: 235 lb (106.6 kg)  Height: 5' 10 (1.778 m)     End Time: 1443 hrs.  Type of Imaging Technique: None used Indication(s): N/A Exposure Time: No patient exposure Contrast: None used. Fluoroscopic Guidance: N/A Ultrasound Guidance:  N/A Interpretation: N/A  Post-operative Assessment:  Post-procedure Vital Signs:  Pulse/HCG Rate: 96  Temp: (!) 97.1 F (36.2 C) Resp: 18 BP: 117/72 SpO2: 97 %  EBL: None  Complications: No immediate post-treatment complications observed by team, or reported by patient.  Note: The patient tolerated the entire procedure well. A repeat set of vitals were taken after the procedure and the patient was kept under observation following institutional policy, for this type of procedure. Post-procedural neurological assessment was performed, showing return to baseline, prior to discharge. The patient was provided with post-procedure discharge instructions, including a section on how to identify potential problems. Should any problems arise concerning this procedure, the patient was given instructions to immediately contact us , at any time, without hesitation. In any case, we plan to contact the patient by telephone for a follow-up status report regarding this interventional procedure.  Comments:  No additional relevant information.  Plan of Care (POC)  Orders:  Orders Placed This Encounter  Procedures   NEUROLYSIS    Please order Qutenza  patches    Standing Status:   Standing    Number of Occurrences:   4    Next Expected Occurrence:   03/23/2025    Expiration Date:   01/03/2026    Where will this procedure  be performed?:   [facility]    Percocet 5 mg TID as needed, quantity 29month   Medications ordered for procedure: Meds ordered this encounter  Medications   capsaicin  topical system 8 % patch 4 patch   Medications administered: We administered capsaicin  topical system.  See the medical record for exact dosing, route, and time of administration.   Follow-up plan:   Return in about 3 months (around 04/03/2025) for Qutenza  .     Recent Visits Date Type Provider Dept  12/26/24 Office Visit Patel, Seema K, NP [facility]  Showing recent visits within past 90 days and meeting all other requirements Today's Visits Date Type Provider Dept  01/03/25 Procedure visit Marcelino Nurse, MD [facility]  Showing today's visits and meeting all other requirements Future Appointments Date Type Provider Dept  02/19/25 Appointment Patel, Seema K, NP [facility]  Showing future appointments within next 90 days and meeting all other requirements   Disposition: Discharge home  Discharge (Date  Time): 01/03/2025; 1505 hrs.   Primary Care Physician: Donzella Lauraine SAILOR, DO Location: [facility] Note by: Nurse Marcelino, MD (TTS technology used. I apologize for any typographical errors that were not detected and corrected.) Date: 01/03/2025; Time: 3:33 PM  Disclaimer:  Medicine is not an visual merchandiser. The only guarantee in medicine is that nothing is guaranteed. It is important to note that the decision to proceed with this intervention was based on the information collected from the patient. The Data and conclusions were drawn from the patient's questionnaire, the interview, and the physical examination. Because the information was provided in large part by the patient, it cannot be guaranteed that it has not been purposely or unconsciously manipulated. Every effort has been made to obtain as much relevant data as possible for this evaluation.  It is important to note that the conclusions that lead to this procedure are derived in large part from the available data. Always take into account that the treatment will also be dependent on availability of resources and existing treatment guidelines, considered by other Pain Management Practitioners as being common knowledge and practice, at the time of the intervention. For Medico-Legal purposes,  it is also important to point out that variation in procedural techniques and pharmacological choices are the acceptable norm. The indications, contraindications, technique, and results of the above procedure should only be interpreted and judged by a Board-Certified Interventional Pain Specialist with extensive familiarity and expertise in the same exact procedure and technique.

## 2025-01-04 ENCOUNTER — Telehealth: Payer: Self-pay

## 2025-01-04 NOTE — Telephone Encounter (Signed)
 Post procedure follow up.  LM

## 2025-01-05 ENCOUNTER — Other Ambulatory Visit: Payer: Self-pay | Admitting: Family Medicine

## 2025-01-05 DIAGNOSIS — E1140 Type 2 diabetes mellitus with diabetic neuropathy, unspecified: Secondary | ICD-10-CM

## 2025-01-08 ENCOUNTER — Ambulatory Visit: Admitting: Family Medicine

## 2025-01-08 ENCOUNTER — Encounter: Payer: Self-pay | Admitting: Family Medicine

## 2025-01-08 ENCOUNTER — Other Ambulatory Visit (HOSPITAL_COMMUNITY)
Admission: RE | Admit: 2025-01-08 | Discharge: 2025-01-08 | Disposition: A | Source: Ambulatory Visit | Attending: Family Medicine | Admitting: Family Medicine

## 2025-01-08 VITALS — BP 100/59 | HR 89 | Temp 97.8°F | Ht 70.0 in | Wt 249.9 lb

## 2025-01-08 DIAGNOSIS — Z993 Dependence on wheelchair: Secondary | ICD-10-CM

## 2025-01-08 DIAGNOSIS — Z124 Encounter for screening for malignant neoplasm of cervix: Secondary | ICD-10-CM | POA: Insufficient documentation

## 2025-01-08 DIAGNOSIS — E890 Postprocedural hypothyroidism: Secondary | ICD-10-CM

## 2025-01-08 DIAGNOSIS — I152 Hypertension secondary to endocrine disorders: Secondary | ICD-10-CM

## 2025-01-08 DIAGNOSIS — E1140 Type 2 diabetes mellitus with diabetic neuropathy, unspecified: Secondary | ICD-10-CM

## 2025-01-08 DIAGNOSIS — Z1151 Encounter for screening for human papillomavirus (HPV): Secondary | ICD-10-CM | POA: Insufficient documentation

## 2025-01-08 DIAGNOSIS — G608 Other hereditary and idiopathic neuropathies: Secondary | ICD-10-CM

## 2025-01-08 DIAGNOSIS — E1169 Type 2 diabetes mellitus with other specified complication: Secondary | ICD-10-CM

## 2025-01-08 DIAGNOSIS — E559 Vitamin D deficiency, unspecified: Secondary | ICD-10-CM

## 2025-01-08 DIAGNOSIS — Z01419 Encounter for gynecological examination (general) (routine) without abnormal findings: Secondary | ICD-10-CM | POA: Diagnosis present

## 2025-01-08 DIAGNOSIS — Z79891 Long term (current) use of opiate analgesic: Secondary | ICD-10-CM

## 2025-01-08 DIAGNOSIS — G894 Chronic pain syndrome: Secondary | ICD-10-CM

## 2025-01-08 MED ORDER — DEXCOM G7 SENSOR MISC: 4 refills | Status: AC

## 2025-01-08 NOTE — Progress Notes (Unsigned)
 "     Established patient visit   Patient: Gabriella Marsh   DOB: December 10, 1966   59 y.o. Female  MRN: [id number] Visit Date: 01/08/2025  Today's healthcare provider: LAURAINE LOISE BUOY, DO   Chief Complaint  Patient presents with   Gynecologic Exam    Patient is here today for a pap smear, concerned as to why she is gaining weight.    Needs to know what to put on a medical bracelet.   Subjective    Gynecologic Exam     Insulin -dep DM, opiate dependent pain, plus emergency contact name and phone number *** Needs transfer WC cushion ordered?***  {History (Optional):23778}  Medications: Show/hide medication list[1]  Review of Systems ***  {Insert previous labs (optional):23779} {See past labs  Heme  Chem  Endocrine  Serology  Results Review (optional):1}   Objective    BP (!) 100/59 (BP Location: Left Arm, Patient Position: Sitting, Cuff Size: Large)   Pulse 89   Temp 97.8 F (36.6 C) (Oral)   Ht 5' 10 (1.778 m)   Wt 249 lb 14.4 oz (113.4 kg)   SpO2 96%   BMI 35.86 kg/m  {Insert last BP/Wt (optional):23777}{See vitals history (optional):1}   Physical Exam Vitals and nursing note reviewed. Exam conducted with a chaperone present.  Constitutional:      General: She is not in acute distress.    Appearance: Normal appearance.  HENT:     Head: Normocephalic and atraumatic.  Eyes:     General: No scleral icterus.    Conjunctiva/sclera: Conjunctivae normal.  Cardiovascular:     Rate and Rhythm: Normal rate.  Pulmonary:     Effort: Pulmonary effort is normal.  Abdominal:     Hernia: There is no hernia in the left inguinal area or right inguinal area.  Genitourinary:    Exam position: Lithotomy position.     Pubic Area: No rash.      Tanner stage (genital): 5.     Labia:        Right: No rash, tenderness, lesion or injury.        Left: No rash, tenderness, lesion or injury.      Vagina: Tenderness (atrophy noted) present.     Cervix: Friability  present. No discharge, lesion, erythema, cervical bleeding or eversion.     Uterus: Absent.   Lymphadenopathy:     Lower Body: No right inguinal adenopathy. No left inguinal adenopathy.  Neurological:     Mental Status: She is alert and oriented to person, place, and time. Mental status is at baseline.  Psychiatric:        Mood and Affect: Mood normal.        Behavior: Behavior normal.      No results found for any visits on 01/08/25.  Assessment & Plan    Pap smear for cervical cancer screening -     Cytology - PAP  Type 2 diabetes mellitus with diabetic neuropathy, with long-term current use of insulin  (HCC)  Chronic pain syndrome  Chronic sensorimotor polyneuropathy with axonal and demyelinating features  Hyperlipidemia associated with type 2 diabetes mellitus (HCC)  Wheelchair dependence  Hypertension associated with diabetes (HCC)  Chronic use of opiate for therapeutic purpose    ***  No follow-ups on file.      I discussed the assessment and treatment plan with the patient  The patient was provided an opportunity to ask questions and all were answered. The patient agreed with the plan and demonstrated  an understanding of the instructions.   The patient was advised to call back or seek an in-person evaluation if the symptoms worsen or if the condition fails to improve as anticipated.    LAURAINE LOISE BUOY, DO  [facility] [phone] (phone) [phone] (fax)  [facility]    [1]  Outpatient Medications Prior to Visit  Medication Sig   acetaminophen  (TYLENOL ) 500 MG tablet Take 1,000 mg by mouth every 12 (twelve) hours as needed for moderate pain.   atorvastatin  (LIPITOR) 20 MG tablet TAKE 1 TABLET(20 MG) BY MOUTH DAILY   cyclobenzaprine  (FLEXERIL ) 5 MG tablet Take 1 tablet (5 mg total) by mouth 3 (three) times daily as needed for muscle spasms.   Dulaglutide  4.5 MG/0.5ML SOAJ Inject 4.5 mg into the skin once a  week.   Elastic Bandages & Supports (MEDICAL COMPRESSION STOCKINGS) MISC 2 each by Does not apply route daily.   fluticasone  (FLONASE ) 50 MCG/ACT nasal spray Place 2 sprays into both nostrils daily.   furosemide  (LASIX ) 20 MG tablet Take 0.5 tablets (10 mg total) by mouth daily as needed for edema.   gabapentin  (NEURONTIN ) 800 MG tablet Take 1 tablet (800 mg total) by mouth 3 (three) times daily.   glucose blood (ACCU-CHEK GUIDE TEST) test strip Use to check blood sugar up to 3 times a day with insulin  use. Dx code: E11.41   insulin  aspart (NOVOLOG ) 100 UNIT/ML FlexPen Inject 20 units three times a day with meals with correction insulin  if needed. Hold if not eating. Max total daily dose of 75 units.   insulin  glargine-yfgn (SEMGLEE ) 100 UNIT/ML Pen Inject up to 75 units daily. Titrate as directed by PCP. Dx code E11.40. Inactivate vial script.   insulin  glargine-yfgn (SEMGLEE ) 100 UNIT/ML Pen Inject up to 75 units daily. Titrate as directed by PCP. Dx code E11.40.   Insulin  Pen Needle 30G X 5 MM MISC Use to inject insulin  4 times per day as prescribed. Dx code: E11.40   Insulin  Syringe-Needle U-100 (INSULIN  SYRINGE 1CC/31GX5/16) 31G X 5/16 1 ML MISC Use with Semglee  vial once a day. Dx code E11.40   levothyroxine  (SYNTHROID ) 112 MCG tablet Take 1 tablet daily. Taking with 125mcg daily for total of 237mcg daily.   levothyroxine  (SYNTHROID ) 125 MCG tablet Take 1 tablet (125 mcg total) by mouth daily.   lisinopril  (ZESTRIL ) 2.5 MG tablet Take 1 tablet (2.5 mg total) by mouth daily.   Melatonin 10 MG TABS Take 10 mg by mouth at bedtime.   metFORMIN  (GLUCOPHAGE ) 500 MG tablet Take 2 tablets (1,000 mg total) by mouth 2 (two) times daily with a meal.   nortriptyline  (PAMELOR ) 50 MG capsule Take 1 capsule (50 mg total) by mouth at bedtime.   nystatin -triamcinolone  ointment (MYCOLOG) Apply 1 Application topically 2 (two) times daily.   omeprazole  (PRILOSEC) 40 MG capsule Take 1 capsule (40 mg total) by  mouth daily.   oxyCODONE -acetaminophen  (PERCOCET) 5-325 MG tablet Take 1 tablet by mouth every 8 (eight) hours. Must last 30 days.   [START ON 01/29/2025] oxyCODONE -acetaminophen  (PERCOCET) 5-325 MG tablet Take 1 tablet by mouth every 8 (eight) hours. Must last 30 days.   [START ON 02/28/2025] oxyCODONE -acetaminophen  (PERCOCET) 5-325 MG tablet Take 1 tablet by mouth every 8 (eight) hours. Must last 30 days.   senna (SENOKOT) 8.6 MG tablet Take 1 tablet by mouth every three (3) days as needed for constipation.   traZODone  (DESYREL ) 100 MG tablet Take 1 tablet (100 mg total)  by mouth at bedtime.   Vitamin D , Ergocalciferol , (DRISDOL ) 1.25 MG (50000 UNIT) CAPS capsule Take 1 capsule (50,000 Units total) by mouth every 7 (seven) days.   No facility-administered medications prior to visit.   "

## 2025-01-08 NOTE — Patient Instructions (Signed)
 Please call the [facility] [phone]) to schedule a routine screening mammogram.

## 2025-01-09 LAB — COMPREHENSIVE METABOLIC PANEL WITH GFR
ALT: 21 IU/L (ref 0–32)
AST: 19 IU/L (ref 0–40)
Albumin: 3.8 g/dL (ref 3.8–4.9)
Alkaline Phosphatase: 64 IU/L (ref 49–135)
BUN/Creatinine Ratio: 26 — ABNORMAL HIGH (ref 9–23)
BUN: 23 mg/dL (ref 6–24)
Bilirubin Total: 0.7 mg/dL (ref 0.0–1.2)
CO2: 26 mmol/L (ref 20–29)
Calcium: 8.4 mg/dL — ABNORMAL LOW (ref 8.7–10.2)
Chloride: 95 mmol/L — ABNORMAL LOW (ref 96–106)
Creatinine, Ser: 0.89 mg/dL (ref 0.57–1.00)
Globulin, Total: 2.7 g/dL (ref 1.5–4.5)
Glucose: 150 mg/dL — ABNORMAL HIGH (ref 70–99)
Potassium: 5.1 mmol/L (ref 3.5–5.2)
Sodium: 137 mmol/L (ref 134–144)
Total Protein: 6.5 g/dL (ref 6.0–8.5)
eGFR: 76 mL/min/1.73

## 2025-01-09 LAB — LIPID PANEL
Chol/HDL Ratio: 4 ratio (ref 0.0–4.4)
Cholesterol, Total: 132 mg/dL (ref 100–199)
HDL: 33 mg/dL — ABNORMAL LOW
LDL Chol Calc (NIH): 56 mg/dL (ref 0–99)
Triglycerides: 271 mg/dL — ABNORMAL HIGH (ref 0–149)
VLDL Cholesterol Cal: 43 mg/dL — ABNORMAL HIGH (ref 5–40)

## 2025-01-09 LAB — MICROALBUMIN / CREATININE URINE RATIO
Creatinine, Urine: 232.3 mg/dL
Microalb/Creat Ratio: 6 mg/g{creat} (ref 0–29)
Microalbumin, Urine: 14.7 ug/mL

## 2025-01-09 LAB — HEMOGLOBIN A1C
Est. average glucose Bld gHb Est-mCnc: 157 mg/dL
Hgb A1c MFr Bld: 7.1 % — ABNORMAL HIGH (ref 4.8–5.6)

## 2025-01-09 LAB — VITAMIN D 25 HYDROXY (VIT D DEFICIENCY, FRACTURES): Vit D, 25-Hydroxy: 28.4 ng/mL — ABNORMAL LOW (ref 30.0–100.0)

## 2025-01-09 LAB — TSH RFX ON ABNORMAL TO FREE T4: TSH: 2.57 u[IU]/mL (ref 0.450–4.500)

## 2025-01-10 ENCOUNTER — Encounter: Payer: Self-pay | Admitting: Family Medicine

## 2025-01-10 DIAGNOSIS — E1140 Type 2 diabetes mellitus with diabetic neuropathy, unspecified: Secondary | ICD-10-CM

## 2025-01-11 LAB — CYTOLOGY - PAP
Comment: NEGATIVE
Diagnosis: NEGATIVE
Diagnosis: REACTIVE
High risk HPV: NEGATIVE

## 2025-01-16 ENCOUNTER — Telehealth: Payer: Self-pay

## 2025-01-16 ENCOUNTER — Ambulatory Visit: Payer: Self-pay | Admitting: Family Medicine

## 2025-01-16 MED ORDER — LANTUS SOLOSTAR 100 UNIT/ML ~~LOC~~ SOPN: PEN_INJECTOR | SUBCUTANEOUS | 99 refills | Status: AC

## 2025-01-16 NOTE — Telephone Encounter (Signed)
 [facility] spoke with [facility], called regarding dexcom G7. How are we able to proceed with sending rx in through them. Per rose she will fax over an order form that needs to be signed and faxed back to them. She also informed me during call, they had tried reaching pt various times an unable to get in touch with pt due to not being able to contact pt they cancelled the order fro DexcomG7. They can reinstate rx order but pt must contact them to proceed pt must call them back at [phone].

## 2025-01-16 NOTE — Telephone Encounter (Signed)
 Called pt and made aware of dexcom g7. Pt verbalized understanding. During call pt stated her Insulin  Semglee  that was sent to [facility]) is not in stock and wanted to see if there was another medication you could send or how to proceed with medication.

## 2025-01-17 NOTE — Telephone Encounter (Signed)
 Have been contacted. Pt is also aware.

## 2025-01-22 ENCOUNTER — Other Ambulatory Visit (HOSPITAL_COMMUNITY): Payer: Self-pay

## 2025-01-23 ENCOUNTER — Other Ambulatory Visit: Payer: Self-pay | Admitting: Family Medicine

## 2025-01-23 DIAGNOSIS — E1140 Type 2 diabetes mellitus with diabetic neuropathy, unspecified: Secondary | ICD-10-CM

## 2025-01-24 ENCOUNTER — Telehealth: Payer: Self-pay | Admitting: Pharmacy Technician

## 2025-01-24 ENCOUNTER — Other Ambulatory Visit (HOSPITAL_COMMUNITY): Payer: Self-pay

## 2025-01-24 NOTE — Telephone Encounter (Signed)
 Pharmacy Patient Advocate Encounter   Received notification from Onbase CMM KEY that prior authorization for Insulin  Glargine-yfgn 100UNIT/ML pen-injectors is required/requested.   Insurance verification completed.   The patient is insured through [facility].   Per test claim:  LANTUS  (NAME BRAND) is preferred by the insurance.  If suggested medication is appropriate, Please send in a new RX and discontinue this one. If not, please advise as to why it's not appropriate so that we may request a Prior Authorization. Please note, some preferred medications may still require a PA.  If the suggested medications have not been trialed and there are no contraindications to their use, the PA will not be submitted, as it will not be approved. Archived Key: [id number]

## 2025-01-25 ENCOUNTER — Telehealth: Payer: Self-pay | Admitting: Family Medicine

## 2025-01-25 NOTE — Telephone Encounter (Signed)
 Already sent

## 2025-01-25 NOTE — Telephone Encounter (Unsigned)
 Copied from CRM [id number]. Topic: Clinical - Prescription Issue >> Jan 25, 2025  3:33 PM [name] wrote: Reason for CRM:  insulin  glargine (LANTUS  SOLOSTAR) 100 UNIT/ML Solostar Pen. Gabriella Marsh is calling to make sure this is the insulin  to fill. Last refill was for insulin  glargine-yfgn (SEMGLEE ) 100 UNIT/ML Pen   This is in her chart and I read this to Gabriella Marsh but she does need a nurse to give her this information: Discontinued by: Donzella Lauraine SAILOR, DO on 01/16/2025 14:21 Reason: Not covered by the pt's insurance  Sent to pharmacy as: insulin  glargine-yfgn (SEMGLEE ) 100 UNIT/ML Pen  Reason for Discontinue: Not covered by the pt's insurance  E-Prescribing Status: Receipt confirmed by pharmacy (12/18/2024  2:27 PM EST)  Her number is [phone]

## 2025-01-26 NOTE — Telephone Encounter (Signed)
 Called [facility] spoke with Diane the Pharmacist there and informed her of the discontinued medication Glargine-Yfgn on 01/16/2025 reason was not covered by patient's insurance.  Confirmed patient has a change in therapy to Lantus  Solostar.  Diane stated that she would discontinue the Glargine-Yfgn and fill the Lantus  for the patient.

## 2025-02-19 ENCOUNTER — Encounter: Admitting: Nurse Practitioner

## 2025-04-04 ENCOUNTER — Ambulatory Visit: Admitting: Student in an Organized Health Care Education/Training Program

## 2025-04-10 ENCOUNTER — Encounter
